# Patient Record
Sex: Male | Born: 1945
Health system: Southern US, Community
[De-identification: ages and names within clinical notes are randomized; demographics above are authoritative.]

## PROBLEM LIST (undated history)

## (undated) DIAGNOSIS — M1990 Unspecified osteoarthritis, unspecified site: Secondary | ICD-10-CM

## (undated) DIAGNOSIS — I679 Cerebrovascular disease, unspecified: Secondary | ICD-10-CM

## (undated) DIAGNOSIS — K429 Umbilical hernia without obstruction or gangrene: Secondary | ICD-10-CM

## (undated) DIAGNOSIS — I214 Non-ST elevation (NSTEMI) myocardial infarction: Secondary | ICD-10-CM

## (undated) DIAGNOSIS — G4733 Obstructive sleep apnea (adult) (pediatric): Secondary | ICD-10-CM

## (undated) DIAGNOSIS — U071 COVID-19: Secondary | ICD-10-CM

## (undated) DIAGNOSIS — F32A Depression, unspecified: Secondary | ICD-10-CM

## (undated) DIAGNOSIS — G463 Brain stem stroke syndrome: Secondary | ICD-10-CM

## (undated) DIAGNOSIS — I499 Cardiac arrhythmia, unspecified: Secondary | ICD-10-CM

## (undated) DIAGNOSIS — H9190 Unspecified hearing loss, unspecified ear: Secondary | ICD-10-CM

## (undated) DIAGNOSIS — F0390 Unspecified dementia without behavioral disturbance: Secondary | ICD-10-CM

## (undated) DIAGNOSIS — E119 Type 2 diabetes mellitus without complications: Secondary | ICD-10-CM

## (undated) DIAGNOSIS — I10 Essential (primary) hypertension: Secondary | ICD-10-CM

## (undated) DIAGNOSIS — I2699 Other pulmonary embolism without acute cor pulmonale: Secondary | ICD-10-CM

## (undated) DIAGNOSIS — I513 Intracardiac thrombosis, not elsewhere classified: Secondary | ICD-10-CM

## (undated) DIAGNOSIS — E785 Hyperlipidemia, unspecified: Secondary | ICD-10-CM

## (undated) DIAGNOSIS — F329 Major depressive disorder, single episode, unspecified: Secondary | ICD-10-CM

## (undated) DIAGNOSIS — G4730 Sleep apnea, unspecified: Secondary | ICD-10-CM

## (undated) DIAGNOSIS — I255 Ischemic cardiomyopathy: Secondary | ICD-10-CM

## (undated) DIAGNOSIS — C801 Malignant (primary) neoplasm, unspecified: Secondary | ICD-10-CM

## (undated) DIAGNOSIS — C4490 Unspecified malignant neoplasm of skin, unspecified: Secondary | ICD-10-CM

## (undated) DIAGNOSIS — I2510 Atherosclerotic heart disease of native coronary artery without angina pectoris: Secondary | ICD-10-CM

## (undated) DIAGNOSIS — K55029 Acute infarction of small intestine, extent unspecified: Secondary | ICD-10-CM

## (undated) HISTORY — DX: Intracardiac thrombosis, not elsewhere classified: I51.3

## (undated) HISTORY — DX: Acute infarction of small intestine, extent unspecified: K55.029

## (undated) HISTORY — DX: Obstructive sleep apnea (adult) (pediatric): G47.33

## (undated) HISTORY — DX: Unspecified malignant neoplasm of skin, unspecified: C44.90

## (undated) HISTORY — DX: Ischemic cardiomyopathy: I25.5

## (undated) HISTORY — PX: COLONOSCOPY W/ POLYPECTOMY: SHX1380

## (undated) HISTORY — DX: Type 2 diabetes mellitus without complications: E11.9

## (undated) HISTORY — DX: Essential (primary) hypertension: I10

## (undated) HISTORY — DX: COVID-19: U07.1

## (undated) HISTORY — PX: COLON SURGERY: SHX602

## (undated) HISTORY — DX: Other pulmonary embolism without acute cor pulmonale: I26.99

---

## 1998-08-29 ENCOUNTER — Ambulatory Visit: Admission: RE | Admit: 1998-08-29 | Discharge: 1998-08-29 | Payer: Self-pay | Admitting: Internal Medicine

## 1998-10-11 ENCOUNTER — Ambulatory Visit: Admission: RE | Admit: 1998-10-11 | Discharge: 1998-10-11 | Payer: Self-pay | Admitting: Internal Medicine

## 2001-04-19 ENCOUNTER — Ambulatory Visit (HOSPITAL_COMMUNITY): Admission: RE | Admit: 2001-04-19 | Discharge: 2001-04-19 | Payer: Self-pay | Admitting: Gastroenterology

## 2005-11-03 ENCOUNTER — Ambulatory Visit: Payer: Self-pay | Admitting: Internal Medicine

## 2005-11-28 ENCOUNTER — Ambulatory Visit (HOSPITAL_BASED_OUTPATIENT_CLINIC_OR_DEPARTMENT_OTHER): Admission: RE | Admit: 2005-11-28 | Discharge: 2005-11-28 | Payer: Self-pay | Admitting: Internal Medicine

## 2005-11-30 ENCOUNTER — Ambulatory Visit: Payer: Self-pay | Admitting: Internal Medicine

## 2005-12-12 ENCOUNTER — Ambulatory Visit: Payer: Self-pay | Admitting: Internal Medicine

## 2007-06-07 ENCOUNTER — Ambulatory Visit: Payer: Self-pay | Admitting: Gastroenterology

## 2007-08-06 ENCOUNTER — Ambulatory Visit: Payer: Self-pay | Admitting: Internal Medicine

## 2007-08-20 ENCOUNTER — Encounter: Payer: Self-pay | Admitting: Internal Medicine

## 2007-08-20 ENCOUNTER — Ambulatory Visit: Payer: Self-pay | Admitting: Internal Medicine

## 2010-11-19 ENCOUNTER — Emergency Department (HOSPITAL_COMMUNITY)
Admission: EM | Admit: 2010-11-19 | Discharge: 2010-11-19 | Disposition: A | Discharge status: Home / Self Care | Payer: Medicare Other | Attending: Emergency Medicine | Admitting: Emergency Medicine

## 2010-11-19 DIAGNOSIS — E119 Type 2 diabetes mellitus without complications: Secondary | ICD-10-CM | POA: Insufficient documentation

## 2010-11-19 DIAGNOSIS — IMO0002 Reserved for concepts with insufficient information to code with codable children: Secondary | ICD-10-CM | POA: Insufficient documentation

## 2010-11-19 DIAGNOSIS — I10 Essential (primary) hypertension: Secondary | ICD-10-CM | POA: Insufficient documentation

## 2010-11-19 LAB — BASIC METABOLIC PANEL
CO2: 25 mEq/L (ref 19–32)
Calcium: 9 mg/dL (ref 8.4–10.5)
GFR calc Af Amer: >60 mL/min (ref >60)
GFR calc non Af Amer: 53 mL/min — ABNORMAL LOW (ref >60)
Sodium: 137 mEq/L (ref 135–145)

## 2010-11-19 LAB — CBC
Hemoglobin: 11.3 g/dL — ABNORMAL LOW (ref 13.0–17.0)
MCHC: 32.9 g/dL (ref 30.0–36.0)
Platelets: 337 10*3/uL (ref 150–400)
RDW: 13.1 % (ref 11.5–15.5)

## 2010-11-19 LAB — DIFFERENTIAL
Basophils Absolute: 0.1 10*3/uL (ref 0.0–0.1)
Basophils Relative: 0 % (ref 0–1)
Monocytes Absolute: 1.2 10*3/uL — ABNORMAL HIGH (ref 0.1–1.0)
Neutro Abs: 8.7 10*3/uL — ABNORMAL HIGH (ref 1.7–7.7)

## 2010-11-22 LAB — WOUND CULTURE: Gram Stain: NONE SEEN

## 2010-12-06 NOTE — Procedures (Signed)
Patrick Miranda, Patrick Miranda NO.:  [id number]   MEDICAL RECORD NO.:  [id number]          PATIENT TYPE:  OUT   LOCATION:  SLEEP CENTER                 FACILITY:  [facility] [place]   PHYSICIAN:  Clinton D. Maple Hudson, M.D. DATE OF BIRTH:  12-28-1945   DATE OF STUDY:                              NOCTURNAL POLYSOMNOGRAM   INDICATION FOR STUDY:  Hypersomnia with sleep apnea.   Epworth sleepiness score 20/24, BMI 25.4.  Weight 180 pounds.   HOME MEDICATIONS:  1.  Glyburide.  2.  Metformin.  3.  Altace.   An original diagnostic study was done 14 years ago recording an AHI of 24  per hour.  A subsequent study was done August 29, 1998 recording an AHI of  22.4 per hour with split protocol, CPAP titration to 9 cwp (AHI 3.8 per  hour).   SLEEP ARCHITECTURE:  Total sleep time 289 minutes with sleep efficiency 78%.  Stage I was 5%, stage II 70%, stages III and IV absent.  REM 26% of total  sleep time.  Sleep latency 32 minutes, REM latency 49 minutes, awake after  sleep onset 46 minutes, arousal index 22.6.  No bedtime medication was  reported.   RESPIRATORY DATA:  Split study protocol.  Apnea/hypopnea index (AHI, RDI)  31.3 obstructive events per hour indicating moderate obstructive sleep  apnea/hypopnea syndrome before CPAP.  This included 7 obstructive apneas, 2  central apneas and 57 hypopneas before CPAP.  All events were noted and most  sleep before CPAP was recorded while on his right side.  REM AHI 2.4 per  hour.  CPAP was titrated to 9 cwp, AHI zero per hour.  A medium ComfortGel  mask was used with a heated humidifier.   OXYGEN DATA:  Moderate snoring with oxygen desaturation to a nadir of 89%.  Mean oxygen saturation after CPAP control was 95-96% on room air.   CARDIAC DATA:  Sinus rhythm with heart rate 57-72 beats per minute,  occasional PVCs.   MOVEMENT/PARASOMNIA:  A total of 311 limb jerks were recorded, of which 18  were associated with arousal or awakening for a  periodic limb movement with  arousal index of 3.7 per hour, which is mildly increased.  Bathroom x1.   IMPRESSION/RECOMMENDATION:  1.  Moderate obstructive sleep apnea/hypopnea syndrome, AHI 31.3 per hour      with all events recorded while sleeping on the right side.  Moderate      snoring with oxygen desaturation to a nadir of 89%.  2.  Split protocol study August 29, 1998 had recorded at AHI of 22.4 per      hour and titrated to CPAP of 9 cwp.  Weight at that study was recorded      as 188 pounds.  3.  Successful CPAP titration to 9 cwp, AHI zero per hour.  A medium      ComfortGel mask was used with heated      humidifier.  4.  Periodic limb movement with arousal, 3.7 per hour.      Clinton D. Maple Hudson, M.D.  Diplomate, Biomedical engineer of Sleep Medicine  Electronically Signed     CDY/MEDQ  D:  11/30/2005 12:40:29  T:  12/01/2005 11:14:58  Job:  [id number]

## 2011-08-22 DIAGNOSIS — G463 Brain stem stroke syndrome: Secondary | ICD-10-CM

## 2011-08-22 HISTORY — DX: Brain stem stroke syndrome: G46.3

## 2011-09-05 ENCOUNTER — Emergency Department (HOSPITAL_COMMUNITY): Payer: Medicare Other

## 2011-09-05 ENCOUNTER — Encounter (HOSPITAL_COMMUNITY): Payer: Self-pay

## 2011-09-05 ENCOUNTER — Other Ambulatory Visit: Payer: Self-pay

## 2011-09-05 ENCOUNTER — Inpatient Hospital Stay (HOSPITAL_COMMUNITY)
Admission: EM | Admit: 2011-09-05 | Discharge: 2011-09-07 | DRG: 066 | Disposition: A | Discharge status: Home / Self Care | Payer: Medicare Other | Attending: Internal Medicine | Admitting: Internal Medicine

## 2011-09-05 DIAGNOSIS — I639 Cerebral infarction, unspecified: Secondary | ICD-10-CM | POA: Diagnosis present

## 2011-09-05 DIAGNOSIS — E119 Type 2 diabetes mellitus without complications: Secondary | ICD-10-CM

## 2011-09-05 DIAGNOSIS — I10 Essential (primary) hypertension: Secondary | ICD-10-CM | POA: Diagnosis present

## 2011-09-05 DIAGNOSIS — E785 Hyperlipidemia, unspecified: Secondary | ICD-10-CM

## 2011-09-05 DIAGNOSIS — G4733 Obstructive sleep apnea (adult) (pediatric): Secondary | ICD-10-CM

## 2011-09-05 DIAGNOSIS — J45909 Unspecified asthma, uncomplicated: Secondary | ICD-10-CM | POA: Diagnosis present

## 2011-09-05 DIAGNOSIS — Z7982 Long term (current) use of aspirin: Secondary | ICD-10-CM

## 2011-09-05 DIAGNOSIS — I6350 Cerebral infarction due to unspecified occlusion or stenosis of unspecified cerebral artery: Principal | ICD-10-CM | POA: Diagnosis present

## 2011-09-05 DIAGNOSIS — Z79899 Other long term (current) drug therapy: Secondary | ICD-10-CM

## 2011-09-05 DIAGNOSIS — R471 Dysarthria and anarthria: Secondary | ICD-10-CM | POA: Diagnosis present

## 2011-09-05 DIAGNOSIS — I129 Hypertensive chronic kidney disease with stage 1 through stage 4 chronic kidney disease, or unspecified chronic kidney disease: Secondary | ICD-10-CM | POA: Diagnosis present

## 2011-09-05 DIAGNOSIS — Z23 Encounter for immunization: Secondary | ICD-10-CM

## 2011-09-05 DIAGNOSIS — M129 Arthropathy, unspecified: Secondary | ICD-10-CM | POA: Diagnosis present

## 2011-09-05 DIAGNOSIS — D649 Anemia, unspecified: Secondary | ICD-10-CM | POA: Diagnosis present

## 2011-09-05 DIAGNOSIS — N182 Chronic kidney disease, stage 2 (mild): Secondary | ICD-10-CM

## 2011-09-05 DIAGNOSIS — R279 Unspecified lack of coordination: Secondary | ICD-10-CM | POA: Diagnosis present

## 2011-09-05 HISTORY — DX: Sleep apnea, unspecified: G47.30

## 2011-09-05 HISTORY — DX: Hyperlipidemia, unspecified: E78.5

## 2011-09-05 HISTORY — DX: Unspecified osteoarthritis, unspecified site: M19.90

## 2011-09-05 HISTORY — DX: Essential (primary) hypertension: I10

## 2011-09-05 LAB — CBC
HCT: 33.9 % — ABNORMAL LOW (ref 39.0–52.0)
Hemoglobin: 11.4 g/dL — ABNORMAL LOW (ref 13.0–17.0)
MCH: 30.7 pg (ref 26.0–34.0)
MCHC: 33.6 g/dL (ref 30.0–36.0)
MCV: 91.4 fL (ref 78.0–100.0)

## 2011-09-05 LAB — BASIC METABOLIC PANEL
BUN: 29 mg/dL — ABNORMAL HIGH (ref 6–23)
Chloride: 109 mEq/L (ref 96–112)
Creatinine, Ser: 1.32 mg/dL (ref 0.50–1.35)
GFR calc Af Amer: 63 mL/min — ABNORMAL LOW (ref >90)
GFR calc non Af Amer: 55 mL/min — ABNORMAL LOW (ref >90)
Glucose, Bld: 110 mg/dL — ABNORMAL HIGH (ref 70–99)

## 2011-09-05 LAB — DIFFERENTIAL
Basophils Relative: 1 % (ref 0–1)
Eosinophils Absolute: 0.7 10*3/uL (ref 0.0–0.7)
Lymphs Abs: 3.4 10*3/uL (ref 0.7–4.0)
Monocytes Absolute: 0.9 10*3/uL (ref 0.1–1.0)
Monocytes Relative: 9 % (ref 3–12)

## 2011-09-05 LAB — POCT I-STAT TROPONIN I

## 2011-09-05 MED ORDER — SODIUM CHLORIDE 0.9 % IV SOLN
INTRAVENOUS | Status: DC
  Administered 2011-09-05: 21:00:00 via INTRAVENOUS

## 2011-09-05 NOTE — ED Provider Notes (Signed)
History     CSN: [id number]  Arrival date & time 09/05/11  4098   First MD Initiated Contact with Patient 09/05/11 1910      Chief Complaint  Patient presents with  . Aphasia  . Shoulder Pain    (Consider location/radiation/quality/duration/timing/severity/associated sxs/prior treatment) Patient is a 66 y.o. male presenting with shoulder pain. The history is provided by the patient.  Shoulder Pain Pertinent negatives include no chest pain, no abdominal pain, no headaches and no shortness of breath.   patient states she's had some pains in his right mid upper arm for the last few days. It comes and goes. No trauma. No weakness. He states she's also had some difficulty speaking. He states it is just trouble getting the words out. They are slurred. No headache. He states he's been having a little unsteadiness while walking to. No chest pain. No numbness or weakness. No vision changes. Patient states he's had some difficulty swallowing also.   Past Medical History  Diagnosis Date  . Diabetes mellitus   . Hypertension   . Arthritis   . Hyperlipidemia     History reviewed. No pertinent past surgical history.  History reviewed. No pertinent family history.  History  Substance Use Topics  . Smoking status: Never Smoker   . Smokeless tobacco: Not on file  . Alcohol Use: No      Review of Systems  Constitutional: Negative for activity change and appetite change.  HENT: Negative for neck stiffness.   Eyes: Negative for pain.  Respiratory: Negative for chest tightness and shortness of breath.   Cardiovascular: Negative for chest pain and leg swelling.  Gastrointestinal: Negative for nausea, vomiting, abdominal pain and diarrhea.  Genitourinary: Negative for flank pain.  Musculoskeletal: Negative for back pain.  Skin: Negative for rash.  Neurological: Positive for speech difficulty. Negative for weakness, numbness and headaches.  Psychiatric/Behavioral: Negative for behavioral  problems.    Allergies  Review of patient's allergies indicates no known allergies.  Home Medications   Current Outpatient Rx  Name Route Sig Dispense Refill  . ASPIRIN EC 81 MG PO TBEC Oral Take 81 mg by mouth daily.    Marland Kitchen CARVEDILOL 12.5 MG PO TABS Oral Take 12.5 mg by mouth 2 (two) times daily with a meal.    . DICLOFENAC SODIUM ER 100 MG PO TB24 Oral Take 100 mg by mouth daily.    . GLYBURIDE-METFORMIN 5-500 MG PO TABS Oral Take 1-2 tablets by mouth 3 (three) times daily. Take 1 tablet at breakfast and lunch, and 2 tablets at dinner.    Marland Kitchen LOSARTAN POTASSIUM-HCTZ 100-25 MG PO TABS Oral Take 1 tablet by mouth daily.    Marland Kitchen PRAVASTATIN SODIUM 40 MG PO TABS Oral Take 40 mg by mouth daily.      BP 154/83  Pulse 73  Temp(Src) 98.3 F (36.8 C) (Oral)  Resp 18  SpO2 98%  Physical Exam  Nursing note and vitals reviewed. Constitutional: He is oriented to person, place, and time. He appears well-developed and well-nourished.  HENT:  Head: Normocephalic and atraumatic.  Eyes: EOM are normal. Pupils are equal, round, and reactive to light.  Neck: Normal range of motion. Neck supple.  Cardiovascular: Normal rate, regular rhythm and normal heart sounds.   No murmur heard. Pulmonary/Chest: Effort normal and breath sounds normal.  Abdominal: Soft. Bowel sounds are normal. He exhibits no distension and no mass. There is no tenderness. There is no rebound and no guarding.  Musculoskeletal: Normal range of  motion. He exhibits no edema.       No tenderness to right upper extremity  Neurological: He is alert and oriented to person, place, and time. A cranial nerve deficit is present.       Somewhat slurred speech. Face is symmetric. Cranial nerves intact besides slurred speech.  Skin: Skin is warm and dry.  Psychiatric: He has a normal mood and affect.    ED Course  Procedures (including critical care time)  Labs Reviewed  CBC - Abnormal; Notable for the following:    WBC 10.7 (*)    RBC  3.71 (*)    Hemoglobin 11.4 (*)    HCT 33.9 (*)    All other components within normal limits  DIFFERENTIAL - Abnormal; Notable for the following:    Eosinophils Relative 7 (*)    All other components within normal limits  BASIC METABOLIC PANEL - Abnormal; Notable for the following:    Glucose, Bld 110 (*)    BUN 29 (*)    GFR calc non Af Amer 55 (*)    GFR calc Af Amer 63 (*)    All other components within normal limits  GLUCOSE, CAPILLARY  POCT I-STAT TROPONIN I  URINALYSIS, ROUTINE W REFLEX MICROSCOPIC   Dg Chest 2 View  09/05/2011  *RADIOLOGY REPORT*  Clinical Data: Slurred speech.  Right arm pain.  CHEST - 2 VIEW  Comparison: None.  Findings: Two views of the chest were obtained. Heart and mediastinum are within normal limits.  The trachea is midline.  The lungs are clear.  Mild degenerative changes in the thoracic spine.  IMPRESSION: No acute chest findings.  Original Report Authenticated By: Richarda Overlie, M.D.   Ct Head Wo Contrast  09/05/2011  *RADIOLOGY REPORT*  Clinical Data: Difficulty walking.  Dysphagia.Diabetes and hypertension.  CT HEAD WITHOUT CONTRAST  Technique:  Contiguous axial images were obtained from the base of the skull through the vertex without contrast.  Comparison: None.  Findings: Bone windows demonstrate minimal mucosal thickening of ethmoid air cells.  Cerumen within the right external ear canal.  Soft tissue windows demonstrate advanced vertebral and carotid atherosclerosis.  Mild but age advanced low density in the periventricular white matter likely related to small vessel disease. No  mass lesion, hemorrhage, hydrocephalus, acute infarct, intra-axial, or extra-axial fluid collection.  IMPRESSION:  1. No acute intracranial abnormality. 2.  Small vessel ischemic change.  Original Report Authenticated By: Consuello Bossier, M.D.     1. Dysarthria     Date: 09/05/2011  Rate: 71  Rhythm: normal sinus rhythm  QRS Axis: normal  Intervals: normal  ST/T Wave  abnormalities: normal  Conduction Disutrbances:none  Narrative Interpretation:   Old EKG Reviewed: none available     MDM  Presents to the ER with difficulty speaking for last 2 or 3 days. Speech is a little bit slurred on exam. He reportedly has also had some trouble swallowing. Neuro exam was good except for slurred speech. Head CT is normal. He also his right midarm pain. He is nontender. His EKG is reassuring. He'll be admitted for further evaluation        Juliet Rude. Rubin Payor, MD 09/05/11 2152

## 2011-09-05 NOTE — H&P (Addendum)
Patrick Miranda is an 66 y.o. male.   Chief Complaint: cannot speak properly HPI:  The patient is a 66 year old man who is right-handed, and for the past few days has had initially intermittent and now persistent difficulty with pronouncing words and producing speech. He has not had any problems with headaches, vision change, weakness, gait unsteadiness, palpitations, or chest discomfort. He has no history of cerebrovascular disease. He does have a history of diabetes mellitus, type II, hypertension, and hyperlipidemia. He also has a history of obstructive sleep apnea for which he uses CPAP at 8 cm of pressure. He has family were concerned by changes in his speech so they presented to the emergency room where the workup included labs and a CT scan of the head that were unremarkable as well as an EKG that showed normal sinus rhythm. Currently, he feels fine other than having frustration with speech.  Past Medical History  Diagnosis Date  . Diabetes mellitus   . Hypertension   . Arthritis   . Hyperlipidemia     Medications Prior to Admission  Medication Dose Route Frequency Provider Last Rate Last Dose  . 0.9 %  sodium chloride infusion   Intravenous Continuous Juliet Rude. Rubin Payor, MD 125 mL/hr at 09/05/11 2108     No current outpatient prescriptions on file as of 09/05/2011.    ADDITIONAL HOME MEDICATIONS: see pharmacy reconciliation  PHYSICIANS INVOLVED IN CARE: Geoffry Paradise (PCP), Yancey Flemings, Dr. Maple Hudson  History reviewed. No pertinent past surgical history. he had a colonoscopy done about 4 years ago  History reviewed. No pertinent family history. his family history is most notable for premature cardiovascular disease and strokes in close relatives.   Social History:  reports that he has never smoked. He does not have any smokeless tobacco history on file. He reports that he does not drink alcohol or use illicit drugs. he is married and has one daughter and one son. He has no  history of tobacco or alcohol abuse. 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  Allergies: No Known Allergies   ROS: anemia, asthma, diabetes and high blood pressure, he also has obstructive sleep apnea for which he is on CPAP  PHYSICAL EXAM: Blood pressure 122/54, pulse 66, temperature 98.3 F (36.8 C), temperature source Oral, resp. rate 16, SpO2 98.00%. The patient is an elderly white man who was in no apparent distress lying supine. HEENT exam was significant for the lack of a facial droop. Neck was supple without jugular venous distention or carotid bruit, chest was clear to auscultation, heart had a regular rate and rhythm without significant murmur or gallop, abdomen had normal bowel sounds no tenderness, extremities were without cyanosis, clubbing, or edema. Neurologic exam: He was alert and well oriented with normal affect. He did have mild difficulty with dysarthria and word finding. Cranial nerves II through XII are normal, sensory exam was grossly normal, motor strength was 5 out of 5 throughout, cerebellar function and gait were not assessed in the emergency room. In  Results for orders placed during the hospital encounter of 09/05/11 (from the past 48 hour(s))  GLUCOSE, CAPILLARY     Status: Normal   Collection Time   09/05/11  6:51 PM      Component Value Range Comment   Glucose-Capillary 90  70 - 99 (mg/dL)   CBC     Status: Abnormal   Collection Time   09/05/11  8:46 PM      Component  Value Range Comment   WBC 10.7 (*) 4.0 - 10.5 (K/uL)    RBC 3.71 (*) 4.22 - 5.81 (MIL/uL)    Hemoglobin 11.4 (*) 13.0 - 17.0 (g/dL)    HCT 40.9 (*) 81.1 - 52.0 (%)    MCV 91.4  78.0 - 100.0 (fL)    MCH 30.7  26.0 - 34.0 (pg)    MCHC 33.6  30.0 - 36.0 (g/dL)    RDW 91.4  78.2 - 95.6 (%)    Platelets 315  150 - 400 (K/uL)   DIFFERENTIAL     Status: Abnormal   Collection Time   09/05/11  8:46 PM      Component Value Range Comment   Neutrophils Relative 52  43 - 77 (%)     Neutro Abs 5.5  1.7 - 7.7 (K/uL)    Lymphocytes Relative 32  12 - 46 (%)    Lymphs Abs 3.4  0.7 - 4.0 (K/uL)    Monocytes Relative 9  3 - 12 (%)    Monocytes Absolute 0.9  0.1 - 1.0 (K/uL)    Eosinophils Relative 7 (*) 0 - 5 (%)    Eosinophils Absolute 0.7  0.0 - 0.7 (K/uL)    Basophils Relative 1  0 - 1 (%)    Basophils Absolute 0.1  0.0 - 0.1 (K/uL)   BASIC METABOLIC PANEL     Status: Abnormal   Collection Time   09/05/11  8:46 PM      Component Value Range Comment   Sodium 141  135 - 145 (mEq/L)    Potassium 3.6  3.5 - 5.1 (mEq/L)    Chloride 109  96 - 112 (mEq/L)    CO2 22  19 - 32 (mEq/L)    Glucose, Bld 110 (*) 70 - 99 (mg/dL)    BUN 29 (*) 6 - 23 (mg/dL)    Creatinine, Ser 2.13  0.50 - 1.35 (mg/dL)    Calcium 9.3  8.4 - 10.5 (mg/dL)    GFR calc non Af Amer 55 (*) >90 (mL/min)    GFR calc Af Amer 63 (*) >90 (mL/min)   POCT I-STAT TROPONIN I     Status: Normal   Collection Time   09/05/11  8:59 PM      Component Value Range Comment   Troponin i, poc 0.00  0.00 - 0.08 (ng/mL)    Comment 3             Dg Chest 2 View  09/05/2011  *RADIOLOGY REPORT*  Clinical Data: Slurred speech.  Right arm pain.  CHEST - 2 VIEW  Comparison: None.  Findings: Two views of the chest were obtained. Heart and mediastinum are within normal limits.  The trachea is midline.  The lungs are clear.  Mild degenerative changes in the thoracic spine.  IMPRESSION: No acute chest findings.  Original Report Authenticated By: Richarda Overlie, M.D.   Ct Head Wo Contrast  09/05/2011  *RADIOLOGY REPORT*  Clinical Data: Difficulty walking.  Dysphagia.Diabetes and hypertension.  CT HEAD WITHOUT CONTRAST  Technique:  Contiguous axial images were obtained from the base of the skull through the vertex without contrast.  Comparison: None.  Findings: Bone windows demonstrate minimal mucosal thickening of ethmoid air cells.  Cerumen within the right external ear canal.  Soft tissue windows demonstrate advanced vertebral and  carotid atherosclerosis.  Mild but age advanced low density in the periventricular white matter likely related to small vessel disease. No  mass lesion, hemorrhage, hydrocephalus, acute  infarct, intra-axial, or extra-axial fluid collection.  IMPRESSION:  1. No acute intracranial abnormality. 2.  Small vessel ischemic change.  Original Report Authenticated By: Consuello Bossier, M.D.     Assessment/Plan #1 Dysarthria:  Most likely from new left brain limited stroke in Broca's area, and less likely from a TIA, meds, or infection. We will admit and check expeditious MRI brain, carotid US, echo, telemetry. We will also arrange for expeditious speech path, PT, and OT evaluations. We will also start him on aspirin given the high likelihood that he has had a stroke. #2 DM2:  Stable on current meds. #3 Hypertension:  Stable on current meds.  #4 Anemia: Mild and without evidence of acute bleeding.  #5 Obstructive Sleep Apnea: Clinically stable and we will continue CPAP treatment as an inpatient.   Tremain Rucinski G 09/05/2011, 11:44 PM

## 2011-09-05 NOTE — ED Notes (Signed)
NURSE FIRST: patient ambulatory into [facility] with spouse. c/o slurred speech x 2 days. At the time of arrival, pt aaox3, resp e/u, nad. Speech clear, BUE grips strong and equal, no facial droop. Patient has no complaints of cp, sob, dizziness, unilateral weakness or any n/t. Pt transported to triage and report given to triage RN.

## 2011-09-05 NOTE — ED Notes (Signed)
Pt started to have weakness and difficulty walking along with slurred speech that has been getting worse over the past 2 days. Wife at the bedside states that pt has also been coughing and choking while attempting to eat. Pt does have some slurred speech in triage. Last seen normal 2 days ago.

## 2011-09-06 ENCOUNTER — Encounter (HOSPITAL_COMMUNITY): Payer: Self-pay | Admitting: General Practice

## 2011-09-06 ENCOUNTER — Inpatient Hospital Stay (HOSPITAL_COMMUNITY): Payer: Medicare Other

## 2011-09-06 LAB — CREATININE, SERUM
Creatinine, Ser: 1.27 mg/dL (ref 0.50–1.35)
GFR calc Af Amer: 66 mL/min — ABNORMAL LOW (ref >90)
GFR calc non Af Amer: 57 mL/min — ABNORMAL LOW (ref >90)

## 2011-09-06 LAB — COMPREHENSIVE METABOLIC PANEL
ALT: 8 U/L (ref 0–53)
AST: 17 U/L (ref 0–37)
Albumin: 2.9 g/dL — ABNORMAL LOW (ref 3.5–5.2)
Alkaline Phosphatase: 66 U/L (ref 39–117)
Chloride: 111 mEq/L (ref 96–112)
Potassium: 3.8 mEq/L (ref 3.5–5.1)
Sodium: 141 mEq/L (ref 135–145)
Total Bilirubin: 0.2 mg/dL — ABNORMAL LOW (ref 0.3–1.2)
Total Protein: 5.4 g/dL — ABNORMAL LOW (ref 6.0–8.3)

## 2011-09-06 LAB — CBC
HCT: 31.9 % — ABNORMAL LOW (ref 39.0–52.0)
Hemoglobin: 10.6 g/dL — ABNORMAL LOW (ref 13.0–17.0)
MCHC: 33.2 g/dL (ref 30.0–36.0)
RBC: 3.48 MIL/uL — ABNORMAL LOW (ref 4.22–5.81)

## 2011-09-06 LAB — GLUCOSE, CAPILLARY
Glucose-Capillary: 122 mg/dL — ABNORMAL HIGH (ref 70–99)
Glucose-Capillary: 142 mg/dL — ABNORMAL HIGH (ref 70–99)

## 2011-09-06 MED ORDER — ENOXAPARIN SODIUM 40 MG/0.4ML ~~LOC~~ SOLN
40.0000 mg | SUBCUTANEOUS | Status: DC
  Administered 2011-09-06: 40 mg via SUBCUTANEOUS
  Filled 2011-09-06 (×2): qty 0.4

## 2011-09-06 MED ORDER — ASPIRIN EC 325 MG PO TBEC
325.0000 mg | DELAYED_RELEASE_TABLET | Freq: Every day | ORAL | Status: DC
  Administered 2011-09-06: 325 mg via ORAL
  Filled 2011-09-06: qty 1

## 2011-09-06 MED ORDER — POTASSIUM CHLORIDE IN NACL 20-0.9 MEQ/L-% IV SOLN
INTRAVENOUS | Status: DC
  Filled 2011-09-06 (×2): qty 1000

## 2011-09-06 MED ORDER — CARVEDILOL 12.5 MG PO TABS
12.5000 mg | ORAL_TABLET | Freq: Two times a day (BID) | ORAL | Status: DC
  Administered 2011-09-06 – 2011-09-07 (×3): 12.5 mg via ORAL
  Filled 2011-09-06 (×5): qty 1

## 2011-09-06 MED ORDER — SODIUM CHLORIDE 0.9 % IJ SOLN
3.0000 mL | Freq: Two times a day (BID) | INTRAMUSCULAR | Status: DC
  Administered 2011-09-06 – 2011-09-07 (×2): 3 mL via INTRAVENOUS

## 2011-09-06 MED ORDER — ACETAMINOPHEN 325 MG PO TABS: 650.0000 mg | ORAL_TABLET | Freq: Four times a day (QID) | ORAL | Status: DC | PRN

## 2011-09-06 MED ORDER — SIMVASTATIN 10 MG PO TABS
10.0000 mg | ORAL_TABLET | Freq: Every day | ORAL | Status: DC
  Filled 2011-09-06 (×2): qty 1

## 2011-09-06 MED ORDER — HYDROCHLOROTHIAZIDE 25 MG PO TABS
25.0000 mg | ORAL_TABLET | Freq: Every day | ORAL | Status: DC
  Administered 2011-09-06 – 2011-09-07 (×2): 25 mg via ORAL
  Filled 2011-09-06 (×2): qty 1

## 2011-09-06 MED ORDER — ZOLPIDEM TARTRATE 5 MG PO TABS: 5.0000 mg | ORAL_TABLET | Freq: Every evening | ORAL | Status: DC | PRN

## 2011-09-06 MED ORDER — DICLOFENAC SODIUM 50 MG PO TBEC
50.0000 mg | DELAYED_RELEASE_TABLET | Freq: Two times a day (BID) | ORAL | Status: DC
  Administered 2011-09-06 – 2011-09-07 (×3): 50 mg via ORAL
  Filled 2011-09-06 (×4): qty 1

## 2011-09-06 MED ORDER — GLYBURIDE 5 MG PO TABS
10.0000 mg | ORAL_TABLET | Freq: Every day | ORAL | Status: DC
  Administered 2011-09-06: 10 mg via ORAL
  Filled 2011-09-06 (×2): qty 2

## 2011-09-06 MED ORDER — HYDROCODONE-ACETAMINOPHEN 5-325 MG PO TABS: 1.0000 | ORAL_TABLET | ORAL | Status: DC | PRN

## 2011-09-06 MED ORDER — ACETAMINOPHEN 650 MG RE SUPP: 650.0000 mg | Freq: Four times a day (QID) | RECTAL | Status: DC | PRN

## 2011-09-06 MED ORDER — GLYBURIDE-METFORMIN 5-500 MG PO TABS: 1.0000 | ORAL_TABLET | Freq: Three times a day (TID) | ORAL | Status: DC

## 2011-09-06 MED ORDER — METFORMIN HCL 500 MG PO TABS
500.0000 mg | ORAL_TABLET | ORAL | Status: DC
  Administered 2011-09-06 – 2011-09-07 (×3): 500 mg via ORAL
  Filled 2011-09-06 (×5): qty 1

## 2011-09-06 MED ORDER — ASPIRIN EC 81 MG PO TBEC
81.0000 mg | DELAYED_RELEASE_TABLET | Freq: Every day | ORAL | Status: DC
  Filled 2011-09-06: qty 1

## 2011-09-06 MED ORDER — METFORMIN HCL 500 MG PO TABS
1000.0000 mg | ORAL_TABLET | Freq: Every day | ORAL | Status: DC
  Administered 2011-09-06: 1000 mg via ORAL
  Filled 2011-09-06 (×2): qty 2

## 2011-09-06 MED ORDER — DICLOFENAC SODIUM ER 100 MG PO TB24: 100.0000 mg | ORAL_TABLET | Freq: Every day | ORAL | Status: DC

## 2011-09-06 MED ORDER — GLYBURIDE 5 MG PO TABS
5.0000 mg | ORAL_TABLET | ORAL | Status: DC
  Administered 2011-09-06 – 2011-09-07 (×3): 5 mg via ORAL
  Filled 2011-09-06 (×5): qty 1

## 2011-09-06 MED ORDER — LOSARTAN POTASSIUM-HCTZ 100-25 MG PO TABS: 1.0000 | ORAL_TABLET | Freq: Every day | ORAL | Status: DC

## 2011-09-06 MED ORDER — LOSARTAN POTASSIUM 50 MG PO TABS
100.0000 mg | ORAL_TABLET | Freq: Every day | ORAL | Status: DC
  Administered 2011-09-06 – 2011-09-07 (×2): 100 mg via ORAL
  Filled 2011-09-06 (×2): qty 2

## 2011-09-06 NOTE — Progress Notes (Signed)
CSW received consult for SNF. PT eval with no f/u PT recommendations noted. No other CSW needs reported or noted. CSW signing off. Please re-consult if SNF needed. Dellie Burns, MSW, [place] [phone] (weekend)

## 2011-09-06 NOTE — Progress Notes (Signed)
Speech Language/Pathology Speech Language Pathology Evaluation Patient Details Name: Patrick Miranda MRN: [id number] DOB: 04/22/1946 Today's Date: 09/06/2011  Problem List:  Patient Active Problem List  Diagnoses  . Dysarthria  . Chronic kidney disease (CKD), stage II (mild)  . Hypertension  . DM (diabetes mellitus), type 2  . Hyperlipidemia  . Obstructive sleep apnea  . Anemia   Past Medical History:  Past Medical History  Diagnosis Date  . Diabetes mellitus   . Hypertension   . Arthritis   . Hyperlipidemia   . Sleep apnea     on cpap at home   Past Surgical History: History reviewed. No pertinent past surgical history.  SLP Assessment/Plan/Recommendation  Clinical Impression: Pt is 66 y.o. male who was admitted after experiencing speech changes the last two days.  CT head with no acute intracranial abnormality; MRI pending.  Cranial nerve exam WNL.  Comprehension/expression WFL excluding decreased spontaneity of output per wife.  Pt with mild, likely [facility] dysarthria, with slightly distorted production of consonants during connected speech, however speech is intelligible. Pt informally observed eating/drinking with no apparent signs of dysphagia.  He passed the RN stroke swallow screen, hence clinical swallow assessment is not warranted.  No further SLP needs identified; will sign-off.  SLP Recommendation/Assessment: Patient does not need any further Speech Language Pathology Services No Skilled Speech Therapy: All education completed Recommendations Follow up Recommendations: None Individuals Consulted Consulted and Agree with Results and Recommendations: Patient;Family member/caregiver Family Member Consulted : spouse  SLP Goals n/a   Leeandre Nordling L. Samson Frederic, [place] CCC/SLP Pager [phone]    Blenda Mounts Laurice 09/06/2011, 9:29 AM

## 2011-09-06 NOTE — Progress Notes (Signed)
Subjective: He slept well overnight, not having any difficulty swallowing, speech is slow. Wife indicates that he has had  more subacute problems with generalized slowed speech, slowed thinking, mild dyspraxia.  Objective: Vital signs in last 24 hours: Temp:  [97.9 F (36.6 C)-98.3 F (36.8 C)] 98.1 F (36.7 C) (02/16 1000) Pulse Rate:  [55-73] 64  (02/16 1000) Resp:  [14-20] 20  (02/16 1000) BP: (109-159)/(49-84) 139/68 mmHg (02/16 1000) SpO2:  [97 %-100 %] 98 % (02/16 1000) Weight:  [81.9 kg (180 lb 8.9 oz)] 81.9 kg (180 lb 8.9 oz) (02/16 0115) Weight change:    Intake/Output from previous day: 02/15 0701 - 02/16 0700 In: 1258.7 [I.V.:1258.7] Out: 350 [Urine:350]   General appearance: alert, cooperative and no distress Head: Normocephalic, without obvious abnormality, atraumatic Resp: clear to auscultation bilaterally Cardio: regular rate and rhythm, S1, S2 normal, no murmur, click, rub or gallop Extremities: extremities normal, atraumatic, no cyanosis or edema Neurologic: Mental status: Alert, oriented, thought content appropriate Cranial nerves: normal Motor: grossly normal Coordination: finger to nose normal on the right  Lab Results:  Basename 09/06/11 0209 09/05/11 2046  WBC 8.9 10.7*  HGB 10.6* 11.4*  HCT 31.9* 33.9*  PLT 294 315   BMET  Basename 09/06/11 0600 09/06/11 0209 09/05/11 2046  NA 141 -- 141  K 3.8 -- 3.6  CL 111 -- 109  CO2 23 -- 22  GLUCOSE 95 -- 110*  BUN 24* -- 29*  CREATININE 1.21 1.27 --  CALCIUM 8.9 -- 9.3   CMET CMP     Component Value Date/Time   NA 141 09/06/2011 0600   K 3.8 09/06/2011 0600   CL 111 09/06/2011 0600   CO2 23 09/06/2011 0600   GLUCOSE 95 09/06/2011 0600   BUN 24* 09/06/2011 0600   CREATININE 1.21 09/06/2011 0600   CALCIUM 8.9 09/06/2011 0600   PROT 5.4* 09/06/2011 0600   ALBUMIN 2.9* 09/06/2011 0600   AST 17 09/06/2011 0600   ALT 8 09/06/2011 0600   ALKPHOS 66 09/06/2011 0600   BILITOT 0.2* 09/06/2011 0600   GFRNONAA 61* 09/06/2011 0600   GFRAA 70* 09/06/2011 0600    CBG (last 3)   Basename 09/06/11 0804 09/05/11 1851  GLUCAP 121* 90    INR RESULTS:   No results found for this basename: INR, PROTIME     Studies/Results: Dg Chest 2 View  09/05/2011  *RADIOLOGY REPORT*  Clinical Data: Slurred speech.  Right arm pain.  CHEST - 2 VIEW  Comparison: None.  Findings: Two views of the chest were obtained. Heart and mediastinum are within normal limits.  The trachea is midline.  The lungs are clear.  Mild degenerative changes in the thoracic spine.  IMPRESSION: No acute chest findings.  Original Report Authenticated By: Richarda Overlie, M.D.   Ct Head Wo Contrast  09/05/2011  *RADIOLOGY REPORT*  Clinical Data: Difficulty walking.  Dysphagia.Diabetes and hypertension.  CT HEAD WITHOUT CONTRAST  Technique:  Contiguous axial images were obtained from the base of the skull through the vertex without contrast.  Comparison: None.  Findings: Bone windows demonstrate minimal mucosal thickening of ethmoid air cells.  Cerumen within the right external ear canal.  Soft tissue windows demonstrate advanced vertebral and carotid atherosclerosis.  Mild but age advanced low density in the periventricular white matter likely related to small vessel disease. No  mass lesion, hemorrhage, hydrocephalus, acute infarct, intra-axial, or extra-axial fluid collection.  IMPRESSION:  1. No acute intracranial abnormality. 2.  Small vessel ischemic change.  Original Report Authenticated By: Consuello Bossier, M.D.   Mr Brain Wo Contrast  09/06/2011  *RADIOLOGY REPORT*  Clinical Data: Dysphasia.  Dysarthria.  Hypertension.  Diabetic.  MRI HEAD WITHOUT CONTRAST  Technique:  Multiplanar, multiecho pulse sequences of the brain and surrounding structures were obtained according to standard protocol without intravenous contrast.  Comparison: 09/05/2011 CT.  No comparison MR.  Findings: Small to moderate-size right paracentral pontine acute infarct.   No intracranial hemorrhage.  Moderate small vessel disease type changes.  Global atrophy without hydrocephalus.  No intracranial mass lesion detected on this unenhanced exam.  Major intracranial vascular structures are patent.  Markedly ectatic intracranial vasculature most notable involving the right vertebral artery and basilar artery with indentation upon the pons and hypothalamic region.  Atherosclerotic type changes of the left vertebral artery.  Mild paranasal sinus mucosal thickening.  IMPRESSION: Small to moderate-size right paracentral pontine acute infarct.  Moderate small vessel disease type changes.  Global atrophy without hydrocephalus.  Original Report Authenticated By: Fuller Canada, M.D.    Medications: I have reviewed the patient's current medications.  Assessment/Plan: #1 Acute Stroke:  Report just in from the MRI shows an acute right pons stroke. Will complete workup including carotid US and echo, and see what OT and PT evaluations indicate, may need SNF rehab if not able to ambulate safely. He did not have problems with swallowing with SLP evaluation.  #2 DM2:  Stable on current meds.  #3 HTN:  Stable.   LOS: 1 day   Amarachukwu Lakatos G 09/06/2011, 12:20 PM

## 2011-09-06 NOTE — Progress Notes (Signed)
Called [place] in respiratory about pt getting a cpap machine around 2:06. She stated that she would look for one and if there is one available she would bring it up. I will continue to monitor the pt and check his O2 sats. Sanda Linger

## 2011-09-06 NOTE — Evaluation (Signed)
Physical Therapy Evaluation Patient Details Name: Patrick Miranda MRN: [id number] DOB: Sep 20, 1945 Today's Date: 09/06/2011  Problem List:  Patient Active Problem List  Diagnoses  . Dysarthria  . Chronic kidney disease (CKD), stage II (mild)  . Hypertension  . DM (diabetes mellitus), type 2  . Hyperlipidemia  . Obstructive sleep apnea  . Anemia    Past Medical History:  Past Medical History  Diagnosis Date  . Diabetes mellitus   . Hypertension   . Arthritis   . Hyperlipidemia   . Sleep apnea     on cpap at home   Past Surgical History: History reviewed. No pertinent past surgical history.  PT Assessment/Plan/Recommendation PT Assessment Clinical Impression Statement: pt is a 66 y./o male adm with slurred/ halted speech.  There is little to no evidence that the trunk or limbs have been affected along with pt's speech.  Mild incoordination of R LE was note with gait that did not affect his safety.  Will not pick pt up at this tme.  No f/u needed. PT Recommendation/Assessment: Patent does not need any further PT services No Skilled PT: Patient will have necessary level of assist by caregiver at discharge;Patient at baseline level of functioning PT Recommendation Follow Up Recommendations: No PT follow up Equipment Recommended: None recommended by PT PT Goals     PT Evaluation Precautions/Restrictions    Prior Functioning  Home Living Lives With: Spouse Receives Help From: Family Type of Home: House Home Adaptive Equipment: None Prior Function Level of Independence: Independent with basic ADLs;Independent with gait;Independent with transfers Able to Take Stairs?: Yes Driving: Yes Vocation: Full time employment Cognition Cognition Arousal/Alertness: Awake/alert Overall Cognitive Status: Appears within functional limits for tasks assessed Orientation Level: Oriented X4 Sensation/Coordination Coordination Gross Motor Movements are Fluid and Coordinated:  Yes Fine Motor Movements are Fluid and Coordinated: Yes Extremity Assessment RUE Assessment RUE Assessment: Within Functional Limits LUE Assessment LUE Assessment: Within Functional Limits RLE Assessment RLE Assessment: Within Functional Limits LLE Assessment LLE Assessment: Within Functional Limits Mobility (including Balance) Bed Mobility Bed Mobility: No Transfers Transfers: Yes Sit to Stand: 7: Independent Stand to Sit: 7: Independent Ambulation/Gait Ambulation/Gait: Yes Ambulation/Gait Assistance: 7: Independent Ambulation Distance (Feet): 250 Feet Assistive device: None Gait Pattern: Within Functional Limits (but with ?fatigue R LE became mildly less coordinated) Stairs: Yes Stairs Assistance: 5: Supervision Stair Management Technique: One rail Right;One rail Left;Alternating pattern;Other (comment) (more guarded coming down on his arthritic knees) Number of Stairs: 3   Posture/Postural Control Posture/Postural Control: No significant limitations Balance Balance Assessed:  (generally stable gait) Exercise    End of Session PT - End of Session Activity Tolerance: Patient tolerated treatment well Patient left: in chair;with call bell in reach;with family/visitor present Nurse Communication: Mobility status for ambulation;Mobility status for transfers General Behavior During Session: [facility] for tasks performed Cognition: [facility] for tasks performed  Nollie Terlizzi, Eliseo Gum 09/06/2011, 1:00 PM  09/06/2011  [place] Bing, PT [phone] (pager)

## 2011-09-07 DIAGNOSIS — I639 Cerebral infarction, unspecified: Secondary | ICD-10-CM | POA: Diagnosis present

## 2011-09-07 LAB — URINALYSIS, ROUTINE W REFLEX MICROSCOPIC
Bilirubin Urine: NEGATIVE
Hgb urine dipstick: NEGATIVE
Ketones, ur: NEGATIVE mg/dL
Nitrite: NEGATIVE
pH: 5.5 (ref 5.0–8.0)

## 2011-09-07 MED ORDER — CLOPIDOGREL BISULFATE 75 MG PO TABS: 75.0000 mg | ORAL_TABLET | Freq: Every day | ORAL | Status: DC

## 2011-09-07 NOTE — Progress Notes (Signed)
VASCULAR LAB PRELIMINARY  PRELIMINARY  PRELIMINARY  PRELIMINARY Carotid duplex completed. Bilateral:  No evidence of hemodynamically significant internal carotid artery stenosis.   Vertebral artery flow is antegrade.       Patrick Miranda D,RVS 09/07/2011, 10:26 AM

## 2011-09-07 NOTE — Discharge Summary (Signed)
Physician Discharge Summary  Patient ID: Patrick Miranda MRN: [id number] DOB/AGE: 1945-12-15 66 y.o.  Admit date: 09/05/2011 Discharge date: 09/07/2011   Discharge Diagnoses:  Principal Problem:  *Brainstem stroke Active Problems:  Dysarthria  Chronic kidney disease (CKD), stage II (mild)  DM (diabetes mellitus), type 2  Hypertension  Hyperlipidemia  Obstructive sleep apnea  Anemia   Discharged Condition: good  Hospital Course:  The patient is a 66 year old white man who is right-handed, and for the past few days has had intermittent and now persistent difficulty with pronouncing words and producing speech. He had not had any problems with headaches, vision change, weakness, gait unsteadiness, palpitations, or chest discomfort. He has no history of cerebrovascular disease. He does have a history of diabetes mellitus, type II, hypertension, hyperlipidemia, and obstructive sleep apnea for which is on CPAP treatment. Because of his persistent difficulty with speech, he presented to the emergency room for evaluation with initial workup including labs and a CT scan of the head that showed no acute abnormalities, as was an EKG that showed normal sinus rhythm. At the time of my evaluation he is most frustrated with difficulties with speech. He denied any problems with ambulation. He is on aspirin 81 mg daily for cardiovascular disease prophylaxis.  He was admitted to a medical bed with telemetry. Telemetry showed that he stayed in a normal sinus rhythm. He also had multiple procedures done including a carotid ultrasound exam with preliminary reading showing a normal study, CT scan of the head without IV contrast, and a MRI scan of the brain without contrast that showed a small to moderate-sized right paracentral pontine acute infarct with moderate small vessel ischemic changes and global atrophy without hydrocephalus. He was seen by a physical therapist who did not note any strength or gait  problems, and he was also seen by a speech pathologist who did not note any swallowing difficulty. He did not have an echocardiogram done as this was not available on the weekend. On the day of discharge he was seeing well, and not having any problems with swallowing, headaches, or ambulation. There were no complications during this hospitalization.  Consults: None  Significant Diagnostic Studies:  Dg Chest 2 View  09/05/2011  *RADIOLOGY REPORT*  Clinical Data: Slurred speech.  Right arm pain.  CHEST - 2 VIEW  Comparison: None.  Findings: Two views of the chest were obtained. Heart and mediastinum are within normal limits.  The trachea is midline.  The lungs are clear.  Mild degenerative changes in the thoracic spine.  IMPRESSION: No acute chest findings.  Original Report Authenticated By: Richarda Overlie, M.D.   Ct Head Wo Contrast  09/05/2011  *RADIOLOGY REPORT*  Clinical Data: Difficulty walking.  Dysphagia.Diabetes and hypertension.  CT HEAD WITHOUT CONTRAST  Technique:  Contiguous axial images were obtained from the base of the skull through the vertex without contrast.  Comparison: None.  Findings: Bone windows demonstrate minimal mucosal thickening of ethmoid air cells.  Cerumen within the right external ear canal.  Soft tissue windows demonstrate advanced vertebral and carotid atherosclerosis.  Mild but age advanced low density in the periventricular white matter likely related to small vessel disease. No  mass lesion, hemorrhage, hydrocephalus, acute infarct, intra-axial, or extra-axial fluid collection.  IMPRESSION:  1. No acute intracranial abnormality. 2.  Small vessel ischemic change.  Original Report Authenticated By: Consuello Bossier, M.D.   Mr Brain Wo Contrast  09/06/2011  *RADIOLOGY REPORT*  Clinical Data: Dysphasia.  Dysarthria.  Hypertension.  Diabetic.  MRI HEAD WITHOUT CONTRAST  Technique:  Multiplanar, multiecho pulse sequences of the brain and surrounding structures were obtained according  to standard protocol without intravenous contrast.  Comparison: 09/05/2011 CT.  No comparison MR.  Findings: Small to moderate-size right paracentral pontine acute infarct.  No intracranial hemorrhage.  Moderate small vessel disease type changes.  Global atrophy without hydrocephalus.  No intracranial mass lesion detected on this unenhanced exam.  Major intracranial vascular structures are patent.  Markedly ectatic intracranial vasculature most notable involving the right vertebral artery and basilar artery with indentation upon the pons and hypothalamic region.  Atherosclerotic type changes of the left vertebral artery.  Mild paranasal sinus mucosal thickening.  IMPRESSION: Small to moderate-size right paracentral pontine acute infarct.  Moderate small vessel disease type changes.  Global atrophy without hydrocephalus.  Original Report Authenticated By: Fuller Canada, M.D.    Labs: Lab Results  Component Value Date   WBC 8.9 09/06/2011   HGB 10.6* 09/06/2011   HCT 31.9* 09/06/2011   MCV 91.7 09/06/2011   PLT 294 09/06/2011     Lab 09/06/11 0600  NA 141  K 3.8  CL 111  CO2 23  BUN 24*  CREATININE 1.21  CALCIUM 8.9  PROT 5.4*  BILITOT 0.2*  ALKPHOS 66  ALT 8  AST 17  GLUCOSE 95       No results found for this basename: INR, PROTIME     No results found for this or any previous visit (from the past 240 hour(s)).    Discharge Exam: Blood pressure 137/66, pulse 73, temperature 98.3 F (36.8 C), temperature source Oral, resp. rate 16, height 5\' 10"  (1.778 m), weight 81.9 kg (180 lb 8.9 oz), SpO2 99.00%.  Physical Exam: In general, he is a well-nourished well-developed white man who was in no apparent distress while sitting upright in the chair. HEENT exam was within normal limits, neck was supple without jugular venous distention or carotid bruit, chest was clear to auscultation, heart had a regular rate and rhythm without significant murmur or gallop, abdomen had normal bowel sounds  and no hepatosplenomegaly or tenderness, extremities were without cyanosis, clubbing, or edema. Neurologic exam: He was alert and well oriented with normal affect, cranial nerves II through XII are normal, sensory exam was grossly normal, a Romberg test was normal, motor strength was 5 out of 5 in all extremities, he had minimal bilateral pass pointing on finger to nose testing, he was able to walk a short distance in his room independently with no difficulty.  Disposition: He'll be discharged to home today. I've asked him to schedule a followup visit with his primary care physician, Dr. Geoffry Paradise, within the next week. I've asked him to hold back on returning to work until his evaluation with Dr. Jacky Kindle.  Discharge Orders    Future Orders Please Complete By Expires   Diet - low sodium heart healthy      Increase activity slowly      Discharge instructions      Comments:   Call Dr. Lanell Matar office for an appointment within the next week, at [phone]. Call the physician on call ASAP for worsening symptoms, unusual bleeding, moderate headache.   Call MD for:      Comments:   Call physician for problems with headaches, change in vision, unsteady gait, unusual bleeding, or other concerning symptoms.     Medication List  As of 09/07/2011 10:57 AM   STOP taking these medications  aspirin EC 81 MG tablet      Diclofenac Sodium CR 100 MG 24 hr tablet         TAKE these medications         carvedilol 12.5 MG tablet   Commonly known as: COREG   Take 12.5 mg by mouth 2 (two) times daily with a meal.      clopidogrel 75 MG tablet   Commonly known as: PLAVIX   Take 1 tablet (75 mg total) by mouth daily with breakfast.      glyBURIDE-metformin 5-500 MG per tablet   Commonly known as: GLUCOVANCE   Take 1-2 tablets by mouth 3 (three) times daily. Take 1 tablet at breakfast and lunch, and 2 tablets at dinner.      losartan-hydrochlorothiazide 100-25 MG per tablet   Commonly known  as: HYZAAR   Take 1 tablet by mouth daily.      meloxicam 15 MG tablet   Commonly known as: MOBIC   Take 15 mg by mouth daily.      pravastatin 40 MG tablet   Commonly known as: PRAVACHOL   Take 40 mg by mouth daily.           Follow-up Information    Follow up with ARONSON,RICHARD A, MD. Schedule an appointment as soon as possible for a visit in 1 week.         Signed: Garlan Fillers 09/07/2011, 10:57 AM

## 2011-09-10 ENCOUNTER — Ambulatory Visit: Payer: Medicare Other | Attending: Internal Medicine | Admitting: Physical Therapy

## 2011-09-10 ENCOUNTER — Ambulatory Visit: Payer: Medicare Other | Admitting: Occupational Therapy

## 2011-09-10 DIAGNOSIS — I69998 Other sequelae following unspecified cerebrovascular disease: Secondary | ICD-10-CM | POA: Insufficient documentation

## 2011-09-10 DIAGNOSIS — I69922 Dysarthria following unspecified cerebrovascular disease: Secondary | ICD-10-CM | POA: Insufficient documentation

## 2011-09-10 DIAGNOSIS — M6281 Muscle weakness (generalized): Secondary | ICD-10-CM | POA: Insufficient documentation

## 2011-09-10 DIAGNOSIS — I69919 Unspecified symptoms and signs involving cognitive functions following unspecified cerebrovascular disease: Secondary | ICD-10-CM | POA: Insufficient documentation

## 2011-09-10 DIAGNOSIS — R279 Unspecified lack of coordination: Secondary | ICD-10-CM | POA: Insufficient documentation

## 2011-09-10 DIAGNOSIS — Z5189 Encounter for other specified aftercare: Secondary | ICD-10-CM | POA: Insufficient documentation

## 2011-09-12 ENCOUNTER — Emergency Department (INDEPENDENT_AMBULATORY_CARE_PROVIDER_SITE_OTHER)
Admission: EM | Admit: 2011-09-12 | Discharge: 2011-09-12 | Disposition: A | Discharge status: Home / Self Care | Payer: Medicare Other | Source: Home / Self Care | Attending: Emergency Medicine | Admitting: Emergency Medicine

## 2011-09-12 ENCOUNTER — Encounter (HOSPITAL_COMMUNITY): Payer: Self-pay

## 2011-09-12 DIAGNOSIS — J309 Allergic rhinitis, unspecified: Secondary | ICD-10-CM

## 2011-09-12 DIAGNOSIS — H6120 Impacted cerumen, unspecified ear: Secondary | ICD-10-CM

## 2011-09-12 HISTORY — DX: Brain stem stroke syndrome: G46.3

## 2011-09-12 HISTORY — DX: Cerebrovascular disease, unspecified: I67.9

## 2011-09-12 MED ORDER — FLUTICASONE PROPIONATE 50 MCG/ACT NA SUSP: 2.0000 | Freq: Every day | NASAL | Status: DC

## 2011-09-12 MED ORDER — FEXOFENADINE HCL 180 MG PO TABS: 180.0000 mg | ORAL_TABLET | Freq: Every day | ORAL | Status: DC

## 2011-09-12 NOTE — ED Notes (Addendum)
C/o has 2 day duration of nose running, ?allergies vs sinus? Issue; clear secretions, no facial discomfort; recent "mini stroke" Also c/o ears are stopped up; reportedly told by usual provider he had a lot of wax, but nothing was done

## 2011-09-12 NOTE — ED Notes (Signed)
Patient has ear wax removal by Nando

## 2011-09-12 NOTE — Discharge Instructions (Signed)
Take the medication as written. Start using the [place] med sinus rinse or the Neti pot to help with your nasal congestion, and  To clear any irritants from your nose. Return if you have a fever above 100.4, if you get worse, or for any other concerns.

## 2011-09-12 NOTE — ED Provider Notes (Signed)
History     CSN: [id number]  Arrival date & time 09/12/11  0944   First MD Initiated Contact with Patient 09/12/11 [phone]      Chief Complaint  Patient presents with  . Sinusitis    (Consider location/radiation/quality/duration/timing/severity/associated sxs/prior treatment) HPI Comments: Patient with 2 complaints. First, he reports clear rhinorrhea, itchy, watery eyes, sneezing, sore, irritated throat for several days. Patient has history seasonal allergies, and states that he gets bad around this time of year. Patient is wanting to know what he can take for his symptoms. No headaches, nausea, vomiting, fevers, sinus pain/pressure, purulent nasal discharge, ear pain. Patient also reports decreased hearing in his right ear. Pt was told by his PMD that he had wax buildup in this ear, but states his doctor didn't do anything for this. Patient has not tried anything for this. No otorrhea, denies foreign body insertion.  Patient is a 66 y.o. male presenting with plugged ear sensation. The history is provided by the patient and the spouse.  Ear Fullness This is a recurrent problem. The current episode started more than 1 week ago. The problem occurs constantly. The problem has been gradually worsening. Pertinent negatives include no chest pain, no headaches and no shortness of breath. The symptoms are aggravated by nothing. The symptoms are relieved by nothing. He has tried nothing for the symptoms. The treatment provided no relief.    Past Medical History  Diagnosis Date  . Diabetes mellitus   . Hypertension   . Arthritis   . Hyperlipidemia   . Sleep apnea     on cpap at home  . Cerebral artery disease   . Brain stem stroke syndrome     History reviewed. No pertinent past surgical history.  History reviewed. No pertinent family history.  History  Substance Use Topics  . Smoking status: Never Smoker   . Smokeless tobacco: Not on file  . Alcohol Use: No      Review of Systems    Constitutional: Negative for fever.  HENT: Positive for congestion, sore throat, rhinorrhea, sneezing and postnasal drip. Negative for nosebleeds, facial swelling, trouble swallowing and voice change.   Eyes: Positive for itching. Negative for redness.  Respiratory: Negative for cough, shortness of breath and wheezing.   Cardiovascular: Negative for chest pain.  Gastrointestinal: Negative for nausea and vomiting.  Neurological: Negative for headaches.    Allergies  Review of patient's allergies indicates no known allergies.  Home Medications   Current Outpatient Rx  Name Route Sig Dispense Refill  . CARVEDILOL 12.5 MG PO TABS Oral Take 12.5 mg by mouth 2 (two) times daily with a meal.    . CLOPIDOGREL BISULFATE 75 MG PO TABS Oral Take 1 tablet (75 mg total) by mouth daily with breakfast. 30 tablet 12  . FEXOFENADINE HCL 180 MG PO TABS Oral Take 1 tablet (180 mg total) by mouth daily. 14 tablet 0  . FLUTICASONE PROPIONATE 50 MCG/ACT NA SUSP Nasal Place 2 sprays into the nose daily. 16 g 0  . GLYBURIDE-METFORMIN 5-500 MG PO TABS Oral Take 1-2 tablets by mouth 3 (three) times daily. Take 1 tablet at breakfast and lunch, and 2 tablets at dinner.    Marland Kitchen LOSARTAN POTASSIUM-HCTZ 100-25 MG PO TABS Oral Take 1 tablet by mouth daily.    Marland Kitchen PRAVASTATIN SODIUM 40 MG PO TABS Oral Take 40 mg by mouth daily.      BP 149/80  Pulse 68  Temp(Src) 98.1 F (36.7 C) (Oral)  Resp  14  SpO2 98%  Physical Exam  Nursing note and vitals reviewed. Constitutional: He is oriented to person, place, and time. He appears well-developed and well-nourished.  HENT:  Head: Normocephalic and atraumatic.  Right Ear: Ear canal normal. Decreased hearing is noted.  Left Ear: Hearing, tympanic membrane and ear canal normal.  Nose: Mucosal edema and rhinorrhea present. No epistaxis.  Mouth/Throat: Uvula is midline and mucous membranes are normal. Posterior oropharyngeal erythema present. No oropharyngeal exudate.        Unable to visualize right TM. Canal occluded with wax. Pale boggy nasal mucosa. Clear rhinorrhea No sinus tenderness  Eyes: Conjunctivae and EOM are normal. Pupils are equal, round, and reactive to light.  Neck: Normal range of motion. Neck supple.  Cardiovascular: Normal rate, regular rhythm, normal heart sounds and intact distal pulses.   Pulmonary/Chest: Effort normal and breath sounds normal. No respiratory distress.  Abdominal: Soft. Bowel sounds are normal. He exhibits no distension. There is no tenderness.  Musculoskeletal: Normal range of motion. He exhibits no edema and no tenderness.  Lymphadenopathy:    He has no cervical adenopathy.  Neurological: He is alert and oriented to person, place, and time.  Skin: Skin is warm and dry. No rash noted.  Psychiatric: He has a normal mood and affect. His behavior is normal. Judgment and thought content normal.    ED Course  Procedures (including critical care time)  Labs Reviewed - No data to display No results found.   1. Allergic rhinitis   2. Cerumen impaction       MDM  Previous labs, records reviewed. Patient was admitted to the hospital on 2/15 for shoulder pain, dysarthria. Patient was noted to have slurred speech. MRI showed brain stem stroke. The patient had an unremarkable hospital stay was discharged with followup with his PMD.,   Patient with allergic rhinitis. Will start him on Allegra, Flonase for this. Has cerumen impaction right side. Will have his right  ears irrigated and we'll reevaluate. Gave patient Debrox for him to use at home. On reevaluation, right TM intact. No signs of infection. Hearing improved.  Luiz Blare, MD 09/12/11 [phone]

## 2011-09-16 ENCOUNTER — Ambulatory Visit: Payer: Medicare Other | Admitting: Physical Therapy

## 2011-09-16 ENCOUNTER — Ambulatory Visit: Payer: Medicare Other | Admitting: *Deleted

## 2011-09-18 ENCOUNTER — Ambulatory Visit: Payer: Medicare Other

## 2011-09-19 ENCOUNTER — Ambulatory Visit: Payer: Medicare Other | Admitting: Occupational Therapy

## 2011-09-19 ENCOUNTER — Ambulatory Visit: Payer: Medicare Other | Attending: Internal Medicine | Admitting: Physical Therapy

## 2011-09-19 DIAGNOSIS — R279 Unspecified lack of coordination: Secondary | ICD-10-CM | POA: Insufficient documentation

## 2011-09-19 DIAGNOSIS — Z5189 Encounter for other specified aftercare: Secondary | ICD-10-CM | POA: Insufficient documentation

## 2011-09-19 DIAGNOSIS — M6281 Muscle weakness (generalized): Secondary | ICD-10-CM | POA: Insufficient documentation

## 2011-09-19 DIAGNOSIS — I69922 Dysarthria following unspecified cerebrovascular disease: Secondary | ICD-10-CM | POA: Insufficient documentation

## 2011-09-19 DIAGNOSIS — I69998 Other sequelae following unspecified cerebrovascular disease: Secondary | ICD-10-CM | POA: Insufficient documentation

## 2011-09-19 DIAGNOSIS — I69919 Unspecified symptoms and signs involving cognitive functions following unspecified cerebrovascular disease: Secondary | ICD-10-CM | POA: Insufficient documentation

## 2011-09-22 ENCOUNTER — Ambulatory Visit: Payer: Medicare Other | Admitting: Speech Pathology

## 2011-09-24 ENCOUNTER — Ambulatory Visit: Payer: Medicare Other | Admitting: Occupational Therapy

## 2011-09-24 ENCOUNTER — Ambulatory Visit: Payer: Medicare Other | Admitting: Speech Pathology

## 2011-09-24 ENCOUNTER — Ambulatory Visit: Payer: Medicare Other | Admitting: Physical Therapy

## 2011-09-26 ENCOUNTER — Ambulatory Visit: Payer: Medicare Other | Admitting: Occupational Therapy

## 2011-09-26 ENCOUNTER — Ambulatory Visit: Payer: Medicare Other | Admitting: Physical Therapy

## 2011-09-30 ENCOUNTER — Ambulatory Visit: Payer: Medicare Other

## 2011-09-30 ENCOUNTER — Ambulatory Visit: Payer: Medicare Other | Admitting: Occupational Therapy

## 2011-09-30 ENCOUNTER — Ambulatory Visit: Payer: Medicare Other | Admitting: Physical Therapy

## 2011-10-01 ENCOUNTER — Encounter: Payer: Medicare Other | Admitting: Speech Pathology

## 2011-10-01 ENCOUNTER — Ambulatory Visit: Payer: Medicare Other | Admitting: Physical Therapy

## 2011-10-01 ENCOUNTER — Ambulatory Visit: Payer: Medicare Other | Admitting: Occupational Therapy

## 2011-10-06 ENCOUNTER — Encounter: Payer: Medicare Other | Admitting: Speech Pathology

## 2011-10-06 ENCOUNTER — Ambulatory Visit: Payer: Medicare Other | Admitting: Physical Therapy

## 2011-10-06 ENCOUNTER — Ambulatory Visit: Payer: Medicare Other | Admitting: Occupational Therapy

## 2011-10-08 ENCOUNTER — Ambulatory Visit: Payer: Medicare Other | Admitting: Physical Therapy

## 2011-10-08 ENCOUNTER — Encounter: Payer: Medicare Other | Admitting: Speech Pathology

## 2011-10-08 ENCOUNTER — Encounter: Payer: Medicare Other | Admitting: Occupational Therapy

## 2011-10-13 ENCOUNTER — Encounter: Payer: Medicare Other | Admitting: Occupational Therapy

## 2011-10-13 ENCOUNTER — Ambulatory Visit: Payer: Medicare Other | Admitting: Physical Therapy

## 2011-10-15 ENCOUNTER — Ambulatory Visit: Payer: Medicare Other | Admitting: Physical Therapy

## 2011-10-15 ENCOUNTER — Encounter: Payer: Medicare Other | Admitting: Occupational Therapy

## 2012-03-20 ENCOUNTER — Encounter (HOSPITAL_COMMUNITY): Payer: Self-pay | Admitting: Emergency Medicine

## 2012-03-20 ENCOUNTER — Emergency Department (HOSPITAL_COMMUNITY)
Admission: EM | Admit: 2012-03-20 | Discharge: 2012-03-20 | Disposition: A | Discharge status: Home / Self Care | Payer: Medicare Other | Attending: Emergency Medicine | Admitting: Emergency Medicine

## 2012-03-20 DIAGNOSIS — Z888 Allergy status to other drugs, medicaments and biological substances status: Secondary | ICD-10-CM | POA: Insufficient documentation

## 2012-03-20 DIAGNOSIS — L03119 Cellulitis of unspecified part of limb: Secondary | ICD-10-CM

## 2012-03-20 DIAGNOSIS — E119 Type 2 diabetes mellitus without complications: Secondary | ICD-10-CM | POA: Insufficient documentation

## 2012-03-20 DIAGNOSIS — I10 Essential (primary) hypertension: Secondary | ICD-10-CM | POA: Insufficient documentation

## 2012-03-20 DIAGNOSIS — L02419 Cutaneous abscess of limb, unspecified: Secondary | ICD-10-CM | POA: Insufficient documentation

## 2012-03-20 LAB — CBC WITH DIFFERENTIAL/PLATELET
Eosinophils Absolute: 0.3 10*3/uL (ref 0.0–0.7)
Hemoglobin: 11.2 g/dL — ABNORMAL LOW (ref 13.0–17.0)
Lymphocytes Relative: 28 % (ref 12–46)
Lymphs Abs: 2.3 10*3/uL (ref 0.7–4.0)
Neutro Abs: 5 10*3/uL (ref 1.7–7.7)
Neutrophils Relative %: 60 % (ref 43–77)
Platelets: 290 10*3/uL (ref 150–400)
RBC: 3.62 MIL/uL — ABNORMAL LOW (ref 4.22–5.81)
WBC: 8.3 10*3/uL (ref 4.0–10.5)

## 2012-03-20 LAB — BASIC METABOLIC PANEL
CO2: 27 mEq/L (ref 19–32)
Chloride: 100 mEq/L (ref 96–112)
GFR calc non Af Amer: 52 mL/min — ABNORMAL LOW (ref >90)
Glucose, Bld: 261 mg/dL — ABNORMAL HIGH (ref 70–99)
Potassium: 3.8 mEq/L (ref 3.5–5.1)
Sodium: 137 mEq/L (ref 135–145)

## 2012-03-20 MED ORDER — VANCOMYCIN HCL IN DEXTROSE 1-5 GM/200ML-% IV SOLN
1000.0000 mg | Freq: Once | INTRAVENOUS | Status: AC
  Administered 2012-03-20: 1000 mg via INTRAVENOUS
  Filled 2012-03-20: qty 200

## 2012-03-20 MED ORDER — SULFAMETHOXAZOLE-TRIMETHOPRIM 800-160 MG PO TABS: 2.0000 | ORAL_TABLET | Freq: Two times a day (BID) | ORAL | Status: AC

## 2012-03-20 MED ORDER — CEPHALEXIN 500 MG PO CAPS: 500.0000 mg | ORAL_CAPSULE | Freq: Four times a day (QID) | ORAL | Status: AC

## 2012-03-20 NOTE — ED Notes (Signed)
Pt states understanding of discharge instruction 

## 2012-03-20 NOTE — ED Provider Notes (Addendum)
History  This chart was scribed for Derwood Kaplan, MD by Erskine Emery. This patient was seen in room TR07C/TR07C and the patient's care was started at 15:00.   CSN: [id number]  Arrival date & time 03/20/12  1352   None     Chief Complaint  Patient presents with  . Abscess    right thigh    (Consider location/radiation/quality/duration/timing/severity/associated sxs/prior treatment) The history is provided by the patient. No language interpreter was used.  Patrick Miranda is a 66 y.o. male who presents to the Emergency Department complaining of an abscess on the right thigh, first noticed as a pimple 1 week ago. Pt reports his phone and keys in his pocket rub against it and a couple of days ago they popped the pimple that then drained white fluid. Pt denies any associated nausea, vomiting, fever, chills, dysuria, hematuria, difficulty urinating, abdominal pain, chest pain, or SOB. Pt has not yet seen a physician for this complaint. Pt denies any h/o similar episodes other than an infection he got on his finger a while ago. Pt has a h/o stroke (this past February) and DM for which he takes Glucovance (2 500 x2/day). Dr. Jacky Kindle is the pt's PCP.  Past Medical History  Diagnosis Date  . Diabetes mellitus   . Hypertension   . Arthritis   . Hyperlipidemia   . Sleep apnea     on cpap at home  . Cerebral artery disease   . Brain stem stroke syndrome     History reviewed. No pertinent past surgical history.  No family history on file.  History  Substance Use Topics  . Smoking status: Never Smoker   . Smokeless tobacco: Not on file  . Alcohol Use: No      Review of Systems  Constitutional: Negative for fever and chills.  Respiratory: Negative for shortness of breath.   Cardiovascular: Negative for chest pain.  Gastrointestinal: Negative for nausea, vomiting and abdominal pain.  Genitourinary: Negative for dysuria, hematuria and difficulty urinating.  Skin:       Abscess  on the right thigh.  Neurological: Negative for weakness.    Allergies  Lipitor  Home Medications   Current Outpatient Rx  Name Route Sig Dispense Refill  . CARVEDILOL 12.5 MG PO TABS Oral Take 12.5 mg by mouth 2 (two) times daily with a meal.    . CLOPIDOGREL BISULFATE 75 MG PO TABS Oral Take 75 mg by mouth daily with breakfast.    . ESCITALOPRAM OXALATE 10 MG PO TABS Oral Take 10 mg by mouth every morning.    Marland Kitchen FEXOFENADINE HCL 180 MG PO TABS Oral Take 180 mg by mouth daily as needed. For allergy symptoms    . FLUTICASONE PROPIONATE 50 MCG/ACT NA SUSP Nasal Place 2 sprays into the nose daily as needed. For nasal congestion    . GLYBURIDE-METFORMIN 5-500 MG PO TABS Oral Take 2 tablets by mouth 2 (two) times daily.     Marland Kitchen LOSARTAN POTASSIUM-HCTZ 100-25 MG PO TABS Oral Take 1 tablet by mouth at bedtime.       BP 143/73  Pulse 85  Temp 99 F (37.2 C) (Oral)  Resp 16  SpO2 98%  Physical Exam  Nursing note and vitals reviewed. Constitutional: He is oriented to person, place, and time. He appears well-developed and well-nourished. No distress.  HENT:  Head: Normocephalic and atraumatic.  Mouth/Throat: Oropharynx is clear and moist.  Eyes: EOM are normal.  Neck: Neck supple. No tracheal deviation present.  Cardiovascular: Normal rate, regular rhythm and normal heart sounds.   Pulmonary/Chest: Effort normal. No respiratory distress.       Lungs are clear to ausculation.   Abdominal: Soft. There is no tenderness.  Musculoskeletal: Normal range of motion.  Neurological: He is alert and oriented to person, place, and time.  Skin: Skin is warm and dry.       Right thigh: an erythematous lesion, 9 cm in diameter that is warm to the touch. There's a lesion in the middle, 3 cm in diameter, with some exudate but no fluctuants. Positive induration.  Psychiatric: He has a normal mood and affect. His behavior is normal.    ED Course  Procedures (including critical care time) DIAGNOSTIC  STUDIES: Oxygen Saturation is 98% on room air, normal by my interpretation.    COORDINATION OF CARE: 15:00--I evaluated the patient and we discussed a treatment plan including incision and drainage and antibiotics to which the pt agreed. I instructed the pt to follow up with Dr. Jacky Kindle within the next couple days.  17:30--I performed incision and drainage.  INCISION AND DRAINAGE PROCEDURE NOTE: Patient identification was confirmed and verbal consent was obtained. This procedure was performed by Derwood Kaplan, MD at 5:46 PM. Site: left thigh Sterile procedures observed: Yes Needle size: 22 gauge Anesthetic used (type and amt): Lidocaine 1% with epi  Blade size: 12 Drainage: Serosanguinous, dark blood with debri Complexity: Complex Packing used: 1 inch Site anesthetized, incision made over site, wound drained and explored loculations, rinsed with copious amounts of normal saline, wound packed with sterile gauze, covered with dry, sterile dressing.  Pt tolerated procedure well without complications.  Instructions for care discussed verbally and pt provided with additional written instructions for homecare and f/u.    Labs Reviewed - No data to display No results found.   No diagnosis found.    MDM   DDX: Cellulitis Abscess Necrotizing fascitis  A/P: Pt comes in with cc of abscess. Exam shows fair size erythema with a lesion in the middle that has some exudative/granulation tissue like material. Pt reports purulent discharge at home. There is some skin breakdown atop the lesion - likely from the discharge at home. Pt has no SIRs criteria at arrival, and no systemic complains at all. I would have liked for patient to stay in the ED under cellulitis protocol, but he wants to go home. Since we have no SIRS criteria, i&d was performed, and patient is willing to stay for ivab and is reliable with a reliable f/u - so i am OK with him going home. Good d/c instructions verbalized.   The cellulitis area was marked circumferentially.     Derwood Kaplan, MD 03/20/12 1821  Derwood Kaplan, MD 03/20/12 1610

## 2012-03-20 NOTE — ED Notes (Signed)
Pt reports having 2 areas on right thigh noted beginning of this week. Pt reports last night noticed the areas "busted open", from having phone and keys in his pockets.

## 2012-03-23 LAB — WOUND CULTURE

## 2012-03-23 NOTE — ED Notes (Signed)
+  Wound. +MRSA. Patient treated with Septra DS. Sensitive to same. Per protocol MD. Will call and inform patient of +MRSA.

## 2012-03-24 ENCOUNTER — Encounter (INDEPENDENT_AMBULATORY_CARE_PROVIDER_SITE_OTHER): Payer: Self-pay | Admitting: General Surgery

## 2012-03-24 ENCOUNTER — Ambulatory Visit (INDEPENDENT_AMBULATORY_CARE_PROVIDER_SITE_OTHER): Payer: Medicare Other | Admitting: General Surgery

## 2012-03-24 VITALS — BP 132/80 | HR 66 | Temp 98.0°F | Resp 18 | Ht 70.0 in | Wt 188.0 lb

## 2012-03-24 DIAGNOSIS — L02419 Cutaneous abscess of limb, unspecified: Secondary | ICD-10-CM

## 2012-03-24 NOTE — Patient Instructions (Signed)
Call me next week at [phone] and let me know how this is healing.  If we need to have you come back in we will schedule.

## 2012-03-24 NOTE — Progress Notes (Signed)
Subjective:     Patient ID: Patrick Miranda, male   DOB: 01/24/1946, 66 y.o.   MRN: [id number]  HPI This is a 66 yom who has had a right anterior thigh abscess present for some time.  He had this opened in the er on Saturday.  Since then he has had decrease in cellulitis and this area feels better.  He is on antibiotics now.  He was seen by Dr Jacky Kindle today and sent over to make sure this is completely drained.  He denies fevers.  Review of Systems     Objective:   Physical Exam Right anterior thigh with 2x1 cm open area and some macerated skin but decreased erythema on line drawn Saturday, I probed this today and it is all open with nothing really to drain right now    Assessment:     Right thigh abscess    Plan:     I don't think there is anything else to drain   I repacked today and will have him remove this Saturday.  He will complete his antibiotics.  I asked him to call next week and if improving will follow up as needed.  I think this will heal on its own.

## 2012-03-28 NOTE — ED Notes (Signed)
Called and informed patient of +MRSA and appropriate treatment.

## 2012-08-17 ENCOUNTER — Encounter: Payer: Self-pay | Admitting: Internal Medicine

## 2012-09-08 DIAGNOSIS — E1159 Type 2 diabetes mellitus with other circulatory complications: Secondary | ICD-10-CM | POA: Diagnosis not present

## 2012-09-08 DIAGNOSIS — F329 Major depressive disorder, single episode, unspecified: Secondary | ICD-10-CM | POA: Diagnosis not present

## 2012-09-08 DIAGNOSIS — I6350 Cerebral infarction due to unspecified occlusion or stenosis of unspecified cerebral artery: Secondary | ICD-10-CM | POA: Diagnosis not present

## 2012-09-08 DIAGNOSIS — E1129 Type 2 diabetes mellitus with other diabetic kidney complication: Secondary | ICD-10-CM | POA: Diagnosis not present

## 2012-11-02 DIAGNOSIS — E1129 Type 2 diabetes mellitus with other diabetic kidney complication: Secondary | ICD-10-CM | POA: Diagnosis not present

## 2012-11-02 DIAGNOSIS — H6120 Impacted cerumen, unspecified ear: Secondary | ICD-10-CM | POA: Diagnosis not present

## 2012-11-02 DIAGNOSIS — F329 Major depressive disorder, single episode, unspecified: Secondary | ICD-10-CM | POA: Diagnosis not present

## 2012-12-22 DIAGNOSIS — I6350 Cerebral infarction due to unspecified occlusion or stenosis of unspecified cerebral artery: Secondary | ICD-10-CM | POA: Diagnosis not present

## 2012-12-22 DIAGNOSIS — E785 Hyperlipidemia, unspecified: Secondary | ICD-10-CM | POA: Diagnosis not present

## 2012-12-22 DIAGNOSIS — Z6826 Body mass index (BMI) 26.0-26.9, adult: Secondary | ICD-10-CM | POA: Diagnosis not present

## 2012-12-22 DIAGNOSIS — F329 Major depressive disorder, single episode, unspecified: Secondary | ICD-10-CM | POA: Diagnosis not present

## 2012-12-22 DIAGNOSIS — I10 Essential (primary) hypertension: Secondary | ICD-10-CM | POA: Diagnosis not present

## 2012-12-22 DIAGNOSIS — E1129 Type 2 diabetes mellitus with other diabetic kidney complication: Secondary | ICD-10-CM | POA: Diagnosis not present

## 2012-12-22 DIAGNOSIS — E1159 Type 2 diabetes mellitus with other circulatory complications: Secondary | ICD-10-CM | POA: Diagnosis not present

## 2012-12-22 DIAGNOSIS — N183 Chronic kidney disease, stage 3 unspecified: Secondary | ICD-10-CM | POA: Diagnosis not present

## 2012-12-29 DIAGNOSIS — Z5181 Encounter for therapeutic drug level monitoring: Secondary | ICD-10-CM | POA: Diagnosis not present

## 2012-12-29 DIAGNOSIS — Z7901 Long term (current) use of anticoagulants: Secondary | ICD-10-CM | POA: Diagnosis not present

## 2012-12-30 DIAGNOSIS — Z1211 Encounter for screening for malignant neoplasm of colon: Secondary | ICD-10-CM | POA: Diagnosis not present

## 2012-12-30 DIAGNOSIS — K648 Other hemorrhoids: Secondary | ICD-10-CM | POA: Diagnosis not present

## 2012-12-30 DIAGNOSIS — Z86010 Personal history of colonic polyps: Secondary | ICD-10-CM | POA: Diagnosis not present

## 2012-12-30 DIAGNOSIS — D126 Benign neoplasm of colon, unspecified: Secondary | ICD-10-CM | POA: Diagnosis not present

## 2012-12-30 DIAGNOSIS — K5730 Diverticulosis of large intestine without perforation or abscess without bleeding: Secondary | ICD-10-CM | POA: Diagnosis not present

## 2013-02-14 DIAGNOSIS — L219 Seborrheic dermatitis, unspecified: Secondary | ICD-10-CM | POA: Diagnosis not present

## 2013-03-08 DIAGNOSIS — E785 Hyperlipidemia, unspecified: Secondary | ICD-10-CM | POA: Diagnosis not present

## 2013-03-08 DIAGNOSIS — I10 Essential (primary) hypertension: Secondary | ICD-10-CM | POA: Diagnosis not present

## 2013-03-08 DIAGNOSIS — Z125 Encounter for screening for malignant neoplasm of prostate: Secondary | ICD-10-CM | POA: Diagnosis not present

## 2013-03-08 DIAGNOSIS — E1159 Type 2 diabetes mellitus with other circulatory complications: Secondary | ICD-10-CM | POA: Diagnosis not present

## 2013-03-10 DIAGNOSIS — E119 Type 2 diabetes mellitus without complications: Secondary | ICD-10-CM | POA: Diagnosis not present

## 2013-03-10 DIAGNOSIS — H2510 Age-related nuclear cataract, unspecified eye: Secondary | ICD-10-CM | POA: Diagnosis not present

## 2013-03-14 ENCOUNTER — Encounter: Payer: Self-pay | Admitting: Internal Medicine

## 2013-03-14 DIAGNOSIS — E1159 Type 2 diabetes mellitus with other circulatory complications: Secondary | ICD-10-CM | POA: Diagnosis not present

## 2013-03-14 DIAGNOSIS — Z0000 Encounter for general adult medical examination without abnormal findings: Secondary | ICD-10-CM | POA: Diagnosis not present

## 2013-03-14 DIAGNOSIS — I6350 Cerebral infarction due to unspecified occlusion or stenosis of unspecified cerebral artery: Secondary | ICD-10-CM | POA: Diagnosis not present

## 2013-03-14 DIAGNOSIS — Z23 Encounter for immunization: Secondary | ICD-10-CM | POA: Diagnosis not present

## 2013-03-14 DIAGNOSIS — N183 Chronic kidney disease, stage 3 unspecified: Secondary | ICD-10-CM | POA: Diagnosis not present

## 2013-03-14 DIAGNOSIS — E785 Hyperlipidemia, unspecified: Secondary | ICD-10-CM | POA: Diagnosis not present

## 2013-03-14 DIAGNOSIS — E1139 Type 2 diabetes mellitus with other diabetic ophthalmic complication: Secondary | ICD-10-CM | POA: Diagnosis not present

## 2013-03-14 DIAGNOSIS — E1129 Type 2 diabetes mellitus with other diabetic kidney complication: Secondary | ICD-10-CM | POA: Diagnosis not present

## 2013-03-14 DIAGNOSIS — I10 Essential (primary) hypertension: Secondary | ICD-10-CM | POA: Diagnosis not present

## 2013-04-07 DIAGNOSIS — M1710 Unilateral primary osteoarthritis, unspecified knee: Secondary | ICD-10-CM | POA: Diagnosis not present

## 2013-04-08 ENCOUNTER — Encounter (HOSPITAL_COMMUNITY): Payer: Self-pay | Admitting: Pharmacist

## 2013-04-19 ENCOUNTER — Encounter (HOSPITAL_COMMUNITY)
Admission: RE | Admit: 2013-04-19 | Discharge: 2013-04-19 | Disposition: A | Discharge status: Home / Self Care | Payer: Medicare Other | Source: Ambulatory Visit | Attending: Anesthesiology | Admitting: Anesthesiology

## 2013-04-19 ENCOUNTER — Encounter (HOSPITAL_COMMUNITY)
Admission: RE | Admit: 2013-04-19 | Discharge: 2013-04-19 | Disposition: A | Discharge status: Home / Self Care | Payer: Medicare Other | Source: Ambulatory Visit | Attending: Orthopaedic Surgery | Admitting: Orthopaedic Surgery

## 2013-04-19 ENCOUNTER — Encounter (HOSPITAL_COMMUNITY): Payer: Self-pay

## 2013-04-19 DIAGNOSIS — Z7902 Long term (current) use of antithrombotics/antiplatelets: Secondary | ICD-10-CM | POA: Diagnosis not present

## 2013-04-19 DIAGNOSIS — Z01810 Encounter for preprocedural cardiovascular examination: Secondary | ICD-10-CM | POA: Insufficient documentation

## 2013-04-19 DIAGNOSIS — Z7901 Long term (current) use of anticoagulants: Secondary | ICD-10-CM | POA: Diagnosis not present

## 2013-04-19 DIAGNOSIS — M1710 Unilateral primary osteoarthritis, unspecified knee: Secondary | ICD-10-CM | POA: Diagnosis not present

## 2013-04-19 DIAGNOSIS — G4730 Sleep apnea, unspecified: Secondary | ICD-10-CM | POA: Diagnosis present

## 2013-04-19 DIAGNOSIS — Z96659 Presence of unspecified artificial knee joint: Secondary | ICD-10-CM | POA: Diagnosis not present

## 2013-04-19 DIAGNOSIS — M25569 Pain in unspecified knee: Secondary | ICD-10-CM | POA: Diagnosis not present

## 2013-04-19 DIAGNOSIS — E785 Hyperlipidemia, unspecified: Secondary | ICD-10-CM | POA: Diagnosis present

## 2013-04-19 DIAGNOSIS — E119 Type 2 diabetes mellitus without complications: Secondary | ICD-10-CM | POA: Diagnosis not present

## 2013-04-19 DIAGNOSIS — G8918 Other acute postprocedural pain: Secondary | ICD-10-CM | POA: Diagnosis not present

## 2013-04-19 DIAGNOSIS — Z01818 Encounter for other preprocedural examination: Secondary | ICD-10-CM | POA: Insufficient documentation

## 2013-04-19 DIAGNOSIS — Z01812 Encounter for preprocedural laboratory examination: Secondary | ICD-10-CM | POA: Insufficient documentation

## 2013-04-19 DIAGNOSIS — D62 Acute posthemorrhagic anemia: Secondary | ICD-10-CM | POA: Diagnosis not present

## 2013-04-19 DIAGNOSIS — IMO0002 Reserved for concepts with insufficient information to code with codable children: Secondary | ICD-10-CM | POA: Diagnosis not present

## 2013-04-19 DIAGNOSIS — I10 Essential (primary) hypertension: Secondary | ICD-10-CM | POA: Diagnosis not present

## 2013-04-19 DIAGNOSIS — Z79899 Other long term (current) drug therapy: Secondary | ICD-10-CM | POA: Diagnosis not present

## 2013-04-19 HISTORY — DX: Major depressive disorder, single episode, unspecified: F32.9

## 2013-04-19 HISTORY — DX: Depression, unspecified: F32.A

## 2013-04-19 HISTORY — DX: Umbilical hernia without obstruction or gangrene: K42.9

## 2013-04-19 LAB — CBC
Hemoglobin: 11.8 g/dL — ABNORMAL LOW (ref 13.0–17.0)
MCH: 31.2 pg (ref 26.0–34.0)
MCV: 93.1 fL (ref 78.0–100.0)
Platelets: 328 10*3/uL (ref 150–400)
RDW: 12.9 % (ref 11.5–15.5)
WBC: 6.4 10*3/uL (ref 4.0–10.5)

## 2013-04-19 LAB — BASIC METABOLIC PANEL
Calcium: 9.4 mg/dL (ref 8.4–10.5)
Creatinine, Ser: 1.47 mg/dL — ABNORMAL HIGH (ref 0.50–1.35)
GFR calc Af Amer: 55 mL/min — ABNORMAL LOW (ref >90)
GFR calc non Af Amer: 47 mL/min — ABNORMAL LOW (ref >90)
Glucose, Bld: 179 mg/dL — ABNORMAL HIGH (ref 70–99)

## 2013-04-19 LAB — SURGICAL PCR SCREEN: Staphylococcus aureus: NEGATIVE

## 2013-04-19 LAB — TYPE AND SCREEN: ABO/RH(D): O POS

## 2013-04-19 LAB — PROTIME-INR
INR: 0.91 (ref 0.00–1.49)
Prothrombin Time: 12.1 seconds (ref 11.6–15.2)

## 2013-04-19 LAB — ABO/RH: ABO/RH(D): O POS

## 2013-04-19 LAB — APTT: aPTT: 26 seconds (ref 24–37)

## 2013-04-19 NOTE — H&P (Signed)
  Patrick Miranda is an 67 y.o. male.    Chief Complaint: left knee pain  HPI: Pt is a 67 y.o. male complaining of left knee pain for multiple years. Pain had continually increased since the beginning. X-rays in the clinic show end-stage arthritic changes of the left knee. Pt has tried various conservative treatments which have failed to alleviate their symptoms, including injections and therapy. Various options are discussed with the patient. Risks, benefits and expectations were discussed with the patient. Patient understand the risks, benefits and expectations and wishes to proceed with surgery.   PCP:  Minda Meo, MD  D/C Plans:  Home with HHPT  PMH: Past Medical History  Diagnosis Date  . Diabetes mellitus   . Hypertension   . Arthritis   . Hyperlipidemia   . Sleep apnea     on cpap at home  . Cerebral artery disease   . Brain stem stroke syndrome     PSH: No past surgical history on file.  Social History:  reports that he has never smoked. He does not have any smokeless tobacco history on file. He reports that he does not drink alcohol or use illicit drugs.  Allergies:  Allergies  Allergen Reactions  . Lipitor [Atorvastatin] Other (See Comments)    Muscle pain    Medications: No current facility-administered medications for this encounter.   Current Outpatient Prescriptions  Medication Sig Dispense Refill  . fenofibrate 160 MG tablet Take 160 mg by mouth daily.      . carvedilol (COREG) 12.5 MG tablet Take 12.5 mg by mouth 2 (two) times daily with a meal.      . clopidogrel (PLAVIX) 75 MG tablet Take 75 mg by mouth daily with breakfast.      . escitalopram (LEXAPRO) 10 MG tablet Take 20 mg by mouth daily.       Marland Kitchen glyBURIDE-metformin (GLUCOVANCE) 5-500 MG per tablet Take 2 tablets by mouth 2 (two) times daily with a meal.       . losartan-hydrochlorothiazide (HYZAAR) 100-25 MG per tablet Take 1 tablet by mouth at bedtime.         No results found for  this or any previous visit (from the past 48 hour(s)). No results found.  ROS: Pain with rom of the left lower extremity  Physical Exam: Alert and oriented 67 y.o. male in no acute distress Cranial nerves 2-12 intact Cervical spine: full rom with no tenderness, nv intact distally Chest: active breath sounds bilaterally, no wheeze rhonchi or rales Heart: regular rate and rhythm, no murmur Abd: non tender non distended with active bowel sounds Hip is stable with rom  Left knee with moderate crepitus and pain with rom nv intact distally No rashes or edema Antalgic gait  Assessment/Plan Assessment: left knee pain secondary to end stage osteoarthritis  Plan: Patient will undergo a left total knee replacement by Dr. Ranell Patrick at [facility]. Risks benefits and expectations were discussed with the patient. Patient understand risks, benefits and expectations and wishes to proceed.

## 2013-04-19 NOTE — Pre-Procedure Instructions (Addendum)
Patrick Miranda  04/19/2013   Your procedure is scheduled on:  04/22/13  Report to [facility] short stay admitting at 1130 AM.  Call this number if you have problems the morning of surgery: [phone]   Remember:   Do not eat food or drink liquids after midnight.   Take these medicines the morning of surgery with A SIP OF WATER:  Carvdilolo, lexapro          STOP plavix per dr   Drucilla Schmidt not wear jewelry, make-up or nail polish.  Do not wear lotions, powders, or perfumes. You may wear deodorant.  Do not shave 48 hours prior to surgery. Men may shave face and neck.  Do not bring valuables to the hospital.  [facility] is not responsible                  for any belongings or valuables.               Contacts, dentures or bridgework may not be worn into surgery.  Leave suitcase in the car. After surgery it may be brought to your room.  For patients admitted to the hospital, discharge time is determined by your                treatment team.               Patients discharged the day of surgery will not be allowed to drive  home.  Name and phone number of your driver:  Special Instructions: Incentive Spirometry - Practice and bring it with you on the day of surgery. Shower using CHG 2 nights before surgery and the night before surgery.  If you shower the day of surgery use CHG.  Use special wash - you have one bottle of CHG for all showers.  You should use approximately 1/3 of the bottle for each shower.   Please read over the following fact sheets that you were given: Pain Booklet, Coughing and Deep Breathing, Blood Transfusion Information, Total Joint Packet, MRSA Information and Surgical Site Infection Prevention

## 2013-04-20 NOTE — Progress Notes (Signed)
Anesthesia Chart Review:  Patient is a 67 year old male scheduled for left TKA on 04/22/13 by Dr. Ranell Patrick.  History includes non-smoker, DM2, HLD, HTN, OSA, arthritis, brainstem CVA 08/2011, umbilical hernia, depression.  PCP is Dr. Geoffry Paradise who cleared patient for this procedure.  Carotid duplex on 09/07/11 showed no significant extracranial carotid artery stenosis.  Antegrade vertebral artery flow.  EKG on 04/19/13 showed NSR.  There were Q waves in lead III--minimal in aVF.  There interpreting cardiologist felt EKG was WNL.  Echo on 2/22/13showed borderline concentric LVH, normal LV systolic function, EF > 55%, no regional wall motion abnormalities.  Doppler flow pattern is normal for age.  Mildly dilated LA.  Mild MR/TR.  Trace AR/PR.  CXR on 04/19/13 showed no acute cardiopulmonary disease.  Preoperative labs noted.    If no acute changes then anticipate that he can proceed as planned.  Velna Ochs [facility] Short Stay Center/Anesthesiology Phone [phone] 04/20/2013 9:21 AM

## 2013-04-21 ENCOUNTER — Other Ambulatory Visit: Payer: Self-pay | Admitting: Internal Medicine

## 2013-04-21 DIAGNOSIS — G4733 Obstructive sleep apnea (adult) (pediatric): Secondary | ICD-10-CM

## 2013-04-21 MED ORDER — CEFAZOLIN SODIUM-DEXTROSE 2-3 GM-% IV SOLR
2.0000 g | INTRAVENOUS | Status: AC
  Administered 2013-04-22: 2 g via INTRAVENOUS
  Filled 2013-04-21: qty 50

## 2013-04-21 MED ORDER — CHLORHEXIDINE GLUCONATE 4 % EX LIQD: 60.0000 mL | Freq: Once | CUTANEOUS | Status: DC

## 2013-04-22 ENCOUNTER — Encounter (HOSPITAL_COMMUNITY): Payer: Self-pay | Admitting: *Deleted

## 2013-04-22 ENCOUNTER — Inpatient Hospital Stay (HOSPITAL_COMMUNITY): Payer: Medicare Other

## 2013-04-22 ENCOUNTER — Inpatient Hospital Stay (HOSPITAL_COMMUNITY): Payer: Medicare Other | Admitting: Anesthesiology

## 2013-04-22 ENCOUNTER — Inpatient Hospital Stay (HOSPITAL_COMMUNITY)
Admission: RE | Admit: 2013-04-22 | Discharge: 2013-04-25 | DRG: 470 | Disposition: A | Discharge status: Home Health | Payer: Medicare Other | Source: Ambulatory Visit | Attending: Orthopedic Surgery | Admitting: Orthopedic Surgery

## 2013-04-22 ENCOUNTER — Encounter (HOSPITAL_COMMUNITY): Payer: Self-pay | Admitting: Vascular Surgery

## 2013-04-22 ENCOUNTER — Encounter (HOSPITAL_COMMUNITY)
Admission: RE | Disposition: A | Discharge status: Home Health | Payer: Self-pay | Source: Ambulatory Visit | Attending: Orthopedic Surgery

## 2013-04-22 DIAGNOSIS — E119 Type 2 diabetes mellitus without complications: Secondary | ICD-10-CM | POA: Diagnosis present

## 2013-04-22 DIAGNOSIS — M1712 Unilateral primary osteoarthritis, left knee: Secondary | ICD-10-CM

## 2013-04-22 DIAGNOSIS — I10 Essential (primary) hypertension: Secondary | ICD-10-CM | POA: Diagnosis present

## 2013-04-22 DIAGNOSIS — G4730 Sleep apnea, unspecified: Secondary | ICD-10-CM | POA: Diagnosis present

## 2013-04-22 DIAGNOSIS — D62 Acute posthemorrhagic anemia: Secondary | ICD-10-CM | POA: Diagnosis not present

## 2013-04-22 DIAGNOSIS — Z01812 Encounter for preprocedural laboratory examination: Secondary | ICD-10-CM

## 2013-04-22 DIAGNOSIS — Z79899 Other long term (current) drug therapy: Secondary | ICD-10-CM

## 2013-04-22 DIAGNOSIS — M1710 Unilateral primary osteoarthritis, unspecified knee: Principal | ICD-10-CM | POA: Diagnosis present

## 2013-04-22 DIAGNOSIS — Z7901 Long term (current) use of anticoagulants: Secondary | ICD-10-CM

## 2013-04-22 DIAGNOSIS — Z7902 Long term (current) use of antithrombotics/antiplatelets: Secondary | ICD-10-CM

## 2013-04-22 DIAGNOSIS — E785 Hyperlipidemia, unspecified: Secondary | ICD-10-CM | POA: Diagnosis present

## 2013-04-22 HISTORY — PX: TOTAL KNEE ARTHROPLASTY: SHX125

## 2013-04-22 LAB — GLUCOSE, CAPILLARY: Glucose-Capillary: 250 mg/dL — ABNORMAL HIGH (ref 70–99)

## 2013-04-22 SURGERY — ARTHROPLASTY, KNEE, TOTAL
Anesthesia: Monitor Anesthesia Care | Site: Knee | Laterality: Left | Wound class: Clean

## 2013-04-22 MED ORDER — ACETAMINOPHEN 325 MG PO TABS
650.0000 mg | ORAL_TABLET | Freq: Four times a day (QID) | ORAL | Status: DC | PRN
  Administered 2013-04-23 – 2013-04-24 (×2): 650 mg via ORAL
  Filled 2013-04-22 (×2): qty 2

## 2013-04-22 MED ORDER — ONDANSETRON HCL 4 MG/2ML IJ SOLN: 4.0000 mg | Freq: Four times a day (QID) | INTRAMUSCULAR | Status: DC | PRN

## 2013-04-22 MED ORDER — ONDANSETRON HCL 4 MG PO TABS: 4.0000 mg | ORAL_TABLET | Freq: Four times a day (QID) | ORAL | Status: DC | PRN

## 2013-04-22 MED ORDER — SODIUM CHLORIDE 0.9 % IR SOLN
Status: DC | PRN
  Administered 2013-04-22: 3000 mL

## 2013-04-22 MED ORDER — HYDROCHLOROTHIAZIDE 25 MG PO TABS
25.0000 mg | ORAL_TABLET | Freq: Every day | ORAL | Status: DC
  Administered 2013-04-22 – 2013-04-24 (×3): 25 mg via ORAL
  Filled 2013-04-22 (×4): qty 1

## 2013-04-22 MED ORDER — MENTHOL 3 MG MT LOZG: 1.0000 | LOZENGE | OROMUCOSAL | Status: DC | PRN

## 2013-04-22 MED ORDER — CARVEDILOL 12.5 MG PO TABS
ORAL_TABLET | ORAL | Status: AC
  Administered 2013-04-22: 12.5 mg via ORAL
  Filled 2013-04-22: qty 1

## 2013-04-22 MED ORDER — METHOCARBAMOL 100 MG/ML IJ SOLN
500.0000 mg | Freq: Four times a day (QID) | INTRAVENOUS | Status: DC | PRN
  Filled 2013-04-22: qty 5

## 2013-04-22 MED ORDER — CELECOXIB 200 MG PO CAPS
200.0000 mg | ORAL_CAPSULE | Freq: Two times a day (BID) | ORAL | Status: DC
  Administered 2013-04-22 – 2013-04-25 (×6): 200 mg via ORAL
  Filled 2013-04-22 (×7): qty 1

## 2013-04-22 MED ORDER — CLOPIDOGREL BISULFATE 75 MG PO TABS
75.0000 mg | ORAL_TABLET | Freq: Every day | ORAL | Status: DC
  Administered 2013-04-23 – 2013-04-25 (×3): 75 mg via ORAL
  Filled 2013-04-22 (×4): qty 1

## 2013-04-22 MED ORDER — FERROUS SULFATE 325 (65 FE) MG PO TABS
325.0000 mg | ORAL_TABLET | Freq: Three times a day (TID) | ORAL | Status: DC
  Administered 2013-04-22 – 2013-04-25 (×9): 325 mg via ORAL
  Filled 2013-04-22 (×11): qty 1

## 2013-04-22 MED ORDER — ACETAMINOPHEN 650 MG RE SUPP: 650.0000 mg | Freq: Four times a day (QID) | RECTAL | Status: DC | PRN

## 2013-04-22 MED ORDER — BISACODYL 10 MG RE SUPP: 10.0000 mg | Freq: Every day | RECTAL | Status: DC | PRN

## 2013-04-22 MED ORDER — BUPIVACAINE HCL (PF) 0.75 % IJ SOLN
INTRAMUSCULAR | Status: DC | PRN
  Administered 2013-04-22: 15 mg

## 2013-04-22 MED ORDER — METOCLOPRAMIDE HCL 10 MG PO TABS: 5.0000 mg | ORAL_TABLET | Freq: Three times a day (TID) | ORAL | Status: DC | PRN

## 2013-04-22 MED ORDER — COUMADIN BOOK
Freq: Once | Status: DC
  Filled 2013-04-22: qty 1

## 2013-04-22 MED ORDER — CEFAZOLIN SODIUM-DEXTROSE 2-3 GM-% IV SOLR
2.0000 g | Freq: Four times a day (QID) | INTRAVENOUS | Status: AC
  Administered 2013-04-22 – 2013-04-23 (×2): 2 g via INTRAVENOUS
  Filled 2013-04-22 (×3): qty 50

## 2013-04-22 MED ORDER — OXYCODONE-ACETAMINOPHEN 5-325 MG PO TABS: 1.0000 | ORAL_TABLET | ORAL | Status: DC | PRN

## 2013-04-22 MED ORDER — OXYCODONE HCL 5 MG/5ML PO SOLN: 5.0000 mg | Freq: Once | ORAL | Status: DC | PRN

## 2013-04-22 MED ORDER — GLYBURIDE-METFORMIN 5-500 MG PO TABS: 2.0000 | ORAL_TABLET | Freq: Two times a day (BID) | ORAL | Status: DC

## 2013-04-22 MED ORDER — PHENOL 1.4 % MT LIQD: 1.0000 | OROMUCOSAL | Status: DC | PRN

## 2013-04-22 MED ORDER — WARFARIN - PHARMACIST DOSING INPATIENT: Freq: Every day | Status: DC

## 2013-04-22 MED ORDER — HYDROMORPHONE HCL PF 1 MG/ML IJ SOLN: 1.0000 mg | INTRAMUSCULAR | Status: DC | PRN

## 2013-04-22 MED ORDER — ESCITALOPRAM OXALATE 20 MG PO TABS
20.0000 mg | ORAL_TABLET | Freq: Every day | ORAL | Status: DC
  Administered 2013-04-22 – 2013-04-25 (×4): 20 mg via ORAL
  Filled 2013-04-22 (×4): qty 1

## 2013-04-22 MED ORDER — WARFARIN SODIUM 5 MG PO TABS
5.0000 mg | ORAL_TABLET | Freq: Once | ORAL | Status: AC
  Administered 2013-04-22: 5 mg via ORAL
  Filled 2013-04-22: qty 1

## 2013-04-22 MED ORDER — MIDAZOLAM HCL 5 MG/ML IJ SOLN: 1.0000 mg | Freq: Once | INTRAMUSCULAR | Status: AC

## 2013-04-22 MED ORDER — SODIUM CHLORIDE 0.9 % IV SOLN
INTRAVENOUS | Status: DC
  Administered 2013-04-23 (×2): via INTRAVENOUS

## 2013-04-22 MED ORDER — PHENYLEPHRINE HCL 10 MG/ML IJ SOLN
INTRAMUSCULAR | Status: DC | PRN
  Administered 2013-04-22 (×3): 80 ug via INTRAVENOUS

## 2013-04-22 MED ORDER — FENTANYL CITRATE 0.05 MG/ML IJ SOLN
INTRAMUSCULAR | Status: AC
  Administered 2013-04-22: 100 ug via INTRAVENOUS
  Filled 2013-04-22: qty 2

## 2013-04-22 MED ORDER — METHOCARBAMOL 500 MG PO TABS
500.0000 mg | ORAL_TABLET | Freq: Four times a day (QID) | ORAL | Status: DC | PRN
  Administered 2013-04-23 – 2013-04-24 (×4): 500 mg via ORAL
  Filled 2013-04-22 (×4): qty 1

## 2013-04-22 MED ORDER — LOSARTAN POTASSIUM 50 MG PO TABS
100.0000 mg | ORAL_TABLET | Freq: Every day | ORAL | Status: DC
  Administered 2013-04-22 – 2013-04-24 (×3): 100 mg via ORAL
  Filled 2013-04-22 (×4): qty 2

## 2013-04-22 MED ORDER — METFORMIN HCL 500 MG PO TABS
1000.0000 mg | ORAL_TABLET | Freq: Two times a day (BID) | ORAL | Status: DC
  Administered 2013-04-22 – 2013-04-25 (×6): 1000 mg via ORAL
  Filled 2013-04-22 (×8): qty 2

## 2013-04-22 MED ORDER — CARVEDILOL 12.5 MG PO TABS
12.5000 mg | ORAL_TABLET | Freq: Two times a day (BID) | ORAL | Status: DC
  Administered 2013-04-22 – 2013-04-24 (×5): 12.5 mg via ORAL
  Filled 2013-04-22 (×9): qty 1

## 2013-04-22 MED ORDER — LOSARTAN POTASSIUM-HCTZ 100-25 MG PO TABS: 1.0000 | ORAL_TABLET | Freq: Every day | ORAL | Status: DC

## 2013-04-22 MED ORDER — 0.9 % SODIUM CHLORIDE (POUR BTL) OPTIME
TOPICAL | Status: DC | PRN
  Administered 2013-04-22: 1000 mL

## 2013-04-22 MED ORDER — LACTATED RINGERS IV SOLN
Freq: Once | INTRAVENOUS | Status: AC
  Administered 2013-04-22: 11:00:00 via INTRAVENOUS

## 2013-04-22 MED ORDER — PROPOFOL INFUSION 10 MG/ML OPTIME
INTRAVENOUS | Status: DC | PRN
  Administered 2013-04-22: 75 ug/kg/min via INTRAVENOUS

## 2013-04-22 MED ORDER — OXYCODONE HCL 5 MG PO TABS: 5.0000 mg | ORAL_TABLET | Freq: Once | ORAL | Status: DC | PRN

## 2013-04-22 MED ORDER — HYDROMORPHONE HCL PF 1 MG/ML IJ SOLN: 0.2500 mg | INTRAMUSCULAR | Status: DC | PRN

## 2013-04-22 MED ORDER — OXYCODONE HCL 5 MG PO TABS
5.0000 mg | ORAL_TABLET | ORAL | Status: DC | PRN
  Administered 2013-04-22 – 2013-04-23 (×4): 10 mg via ORAL
  Administered 2013-04-23: 5 mg via ORAL
  Administered 2013-04-24 (×3): 10 mg via ORAL
  Filled 2013-04-22 (×7): qty 2
  Filled 2013-04-22: qty 1

## 2013-04-22 MED ORDER — WARFARIN SODIUM 5 MG PO TABS: 5.0000 mg | ORAL_TABLET | Freq: Every day | ORAL | Status: DC

## 2013-04-22 MED ORDER — MIDAZOLAM HCL 2 MG/2ML IJ SOLN
INTRAMUSCULAR | Status: AC
  Administered 2013-04-22: 1 mg
  Filled 2013-04-22: qty 2

## 2013-04-22 MED ORDER — FENOFIBRATE 160 MG PO TABS
160.0000 mg | ORAL_TABLET | Freq: Every day | ORAL | Status: DC
  Administered 2013-04-22 – 2013-04-25 (×4): 160 mg via ORAL
  Filled 2013-04-22 (×4): qty 1

## 2013-04-22 MED ORDER — LACTATED RINGERS IV SOLN
INTRAVENOUS | Status: DC | PRN
  Administered 2013-04-22 (×2): via INTRAVENOUS

## 2013-04-22 MED ORDER — BUPIVACAINE-EPINEPHRINE PF 0.5-1:200000 % IJ SOLN
INTRAMUSCULAR | Status: DC | PRN
  Administered 2013-04-22: 30 mL

## 2013-04-22 MED ORDER — METHOCARBAMOL 500 MG PO TABS: 500.0000 mg | ORAL_TABLET | Freq: Three times a day (TID) | ORAL | Status: DC | PRN

## 2013-04-22 MED ORDER — METOCLOPRAMIDE HCL 5 MG/ML IJ SOLN: 5.0000 mg | Freq: Three times a day (TID) | INTRAMUSCULAR | Status: DC | PRN

## 2013-04-22 MED ORDER — GLYBURIDE 5 MG PO TABS
10.0000 mg | ORAL_TABLET | Freq: Two times a day (BID) | ORAL | Status: DC
  Administered 2013-04-22 – 2013-04-25 (×6): 10 mg via ORAL
  Filled 2013-04-22 (×8): qty 2

## 2013-04-22 MED ORDER — CARVEDILOL 12.5 MG PO TABS
12.5000 mg | ORAL_TABLET | Freq: Once | ORAL | Status: AC
  Administered 2013-04-22: 12.5 mg via ORAL
  Filled 2013-04-22: qty 1

## 2013-04-22 MED ORDER — FENTANYL CITRATE 0.05 MG/ML IJ SOLN
100.0000 ug | Freq: Once | INTRAMUSCULAR | Status: AC
  Administered 2013-04-22: 100 ug via INTRAVENOUS

## 2013-04-22 SURGICAL SUPPLY — 63 items
BANDAGE ELASTIC 4 VELCRO ST LF (GAUZE/BANDAGES/DRESSINGS) ×2 IMPLANT
BANDAGE ESMARK 6X9 LF (GAUZE/BANDAGES/DRESSINGS) ×1 IMPLANT
BANDAGE GAUZE ELAST BULKY 4 IN (GAUZE/BANDAGES/DRESSINGS) ×4 IMPLANT
BLADE SAG 18X100X1.27 (BLADE) ×2 IMPLANT
BLADE SAW SGTL 13.0X1.19X90.0M (BLADE) ×2 IMPLANT
BNDG ELASTIC 6X10 VLCR STRL LF (GAUZE/BANDAGES/DRESSINGS) ×2 IMPLANT
BNDG ESMARK 6X9 LF (GAUZE/BANDAGES/DRESSINGS) ×2
BOWL SMART MIX CTS (DISPOSABLE) ×2 IMPLANT
CAPT RP KNEE ×2 IMPLANT
CEMENT HV SMART SET (Cement) ×4 IMPLANT
CLOTH BEACON ORANGE TIMEOUT ST (SAFETY) ×2 IMPLANT
CLSR STERI-STRIP ANTIMIC 1/2X4 (GAUZE/BANDAGES/DRESSINGS) ×2 IMPLANT
COVER SURGICAL LIGHT HANDLE (MISCELLANEOUS) ×2 IMPLANT
CUFF TOURNIQUET SINGLE 34IN LL (TOURNIQUET CUFF) ×2 IMPLANT
CUFF TOURNIQUET SINGLE 44IN (TOURNIQUET CUFF) IMPLANT
DRAPE EXTREMITY T 121X128X90 (DRAPE) ×2 IMPLANT
DRAPE PROXIMA HALF (DRAPES) ×2 IMPLANT
DRAPE U-SHAPE 47X51 STRL (DRAPES) ×2 IMPLANT
DRSG ADAPTIC 3X8 NADH LF (GAUZE/BANDAGES/DRESSINGS) ×2 IMPLANT
DRSG PAD ABDOMINAL 8X10 ST (GAUZE/BANDAGES/DRESSINGS) ×4 IMPLANT
DURAPREP 26ML APPLICATOR (WOUND CARE) ×4 IMPLANT
ELECT CAUTERY BLADE 6.4 (BLADE) ×2 IMPLANT
ELECT REM PT RETURN 9FT ADLT (ELECTROSURGICAL) ×2
ELECTRODE REM PT RTRN 9FT ADLT (ELECTROSURGICAL) ×1 IMPLANT
FACESHIELD LNG OPTICON STERILE (SAFETY) ×6 IMPLANT
GLOVE BIOGEL PI IND STRL 8 (GLOVE) ×1 IMPLANT
GLOVE BIOGEL PI INDICATOR 8 (GLOVE) ×1
GLOVE BIOGEL PI ORTHO PRO 7.5 (GLOVE) ×1
GLOVE BIOGEL PI ORTHO PRO SZ7 (GLOVE) ×1
GLOVE BIOGEL PI ORTHO PRO SZ8 (GLOVE) ×1
GLOVE ORTHO TXT STRL SZ7.5 (GLOVE) ×2 IMPLANT
GLOVE PI ORTHO PRO STRL 7.5 (GLOVE) ×1 IMPLANT
GLOVE PI ORTHO PRO STRL SZ7 (GLOVE) ×1 IMPLANT
GLOVE PI ORTHO PRO STRL SZ8 (GLOVE) ×1 IMPLANT
GLOVE SURG ORTHO 8.5 STRL (GLOVE) ×6 IMPLANT
GLOVE SURG SS PI 7.0 STRL IVOR (GLOVE) ×2 IMPLANT
GLOVE SURG SS PI 8.0 STRL IVOR (GLOVE) ×4 IMPLANT
GOWN STRL NON-REIN LRG LVL3 (GOWN DISPOSABLE) ×2 IMPLANT
GOWN STRL REIN XL XLG (GOWN DISPOSABLE) ×8 IMPLANT
HANDPIECE INTERPULSE COAX TIP (DISPOSABLE) ×1
IMMOBILIZER KNEE 22 UNIV (SOFTGOODS) ×2 IMPLANT
KIT BASIN OR (CUSTOM PROCEDURE TRAY) ×2 IMPLANT
KIT MANIFOLD (MISCELLANEOUS) ×2 IMPLANT
KIT ROOM TURNOVER OR (KITS) ×2 IMPLANT
MANIFOLD NEPTUNE II (INSTRUMENTS) ×2 IMPLANT
NS IRRIG 1000ML POUR BTL (IV SOLUTION) ×2 IMPLANT
PACK TOTAL JOINT (CUSTOM PROCEDURE TRAY) ×2 IMPLANT
PAD ARMBOARD 7.5X6 YLW CONV (MISCELLANEOUS) ×4 IMPLANT
SET HNDPC FAN SPRY TIP SCT (DISPOSABLE) ×1 IMPLANT
SPONGE GAUZE 4X4 12PLY (GAUZE/BANDAGES/DRESSINGS) ×2 IMPLANT
STRIP CLOSURE SKIN 1/2X4 (GAUZE/BANDAGES/DRESSINGS) ×4 IMPLANT
SUCTION FRAZIER TIP 10 FR DISP (SUCTIONS) ×2 IMPLANT
SUT MNCRL AB 3-0 PS2 18 (SUTURE) ×2 IMPLANT
SUT VIC AB 0 CT1 27 (SUTURE) ×2
SUT VIC AB 0 CT1 27XBRD ANBCTR (SUTURE) ×2 IMPLANT
SUT VIC AB 1 CT1 27 (SUTURE) ×2
SUT VIC AB 1 CT1 27XBRD ANBCTR (SUTURE) ×2 IMPLANT
SUT VIC AB 2-0 CT1 27 (SUTURE) ×2
SUT VIC AB 2-0 CT1 TAPERPNT 27 (SUTURE) ×2 IMPLANT
TOWEL OR 17X24 6PK STRL BLUE (TOWEL DISPOSABLE) ×2 IMPLANT
TOWEL OR 17X26 10 PK STRL BLUE (TOWEL DISPOSABLE) ×2 IMPLANT
TRAY FOLEY CATH 16FRSI W/METER (SET/KITS/TRAYS/PACK) ×2 IMPLANT
WATER STERILE IRR 1000ML POUR (IV SOLUTION) ×4 IMPLANT

## 2013-04-22 NOTE — Progress Notes (Signed)
ANTICOAGULATION CONSULT NOTE - Initial Consult  Pharmacy Consult for warfarin Indication: VTE prophylaxis  Allergies  Allergen Reactions  . Lipitor [Atorvastatin] Other (See Comments)    Muscle pain    Patient Measurements:     Vital Signs: Temp: 97.4 F (36.3 C) (10/03 1403) Temp src: Oral (10/03 1006) BP: 112/75 mmHg (10/03 1445) Pulse Rate: 55 (10/03 1445)  Labs: No results found for this basename: HGB, HCT, PLT, APTT, LABPROT, INR, HEPARINUNFRC, CREATININE, CKTOTAL, CKMB, TROPONINI,  in the last 72 hours  The CrCl is unknown because both a height and weight (above a minimum accepted value) are required for this calculation.   Medical History: Past Medical History  Diagnosis Date  . Diabetes mellitus   . Hypertension   . Arthritis   . Hyperlipidemia   . Sleep apnea     on cpap at home  . Cerebral artery disease   . 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 Hernia, umbilical   . Depression     Medications:  Scheduled:  . carvedilol  12.5 mg Oral BID WC  .  ceFAZolin (ANCEF) IV  2 g Intravenous Q6H  . celecoxib  200 mg Oral Q12H  . [START ON 04/23/2013] clopidogrel  75 mg Oral Q breakfast  . escitalopram  20 mg Oral Daily  . fenofibrate  160 mg Oral Daily  . ferrous sulfate  325 mg Oral TID PC  . glyBURIDE  10 mg Oral BID WC   And  . metFORMIN  1,000 mg Oral BID WC  . losartan  100 mg Oral Daily   And  . hydrochlorothiazide  25 mg Oral Daily    Assessment: 67 YOM s/p left TKA to start warfarin for VTE prophylaxis post-op. Not on anticoagulants PTA. 9/30 labs reveal a slightly low Hgb at 11.8, normal platelets at 328 and a baseline INR of 0.91.  Goal of Therapy:  INR 2-3 Monitor platelets by anticoagulation protocol: Yes   Plan:  1. Warfarin 5mg  po x1 tonight 2. Daily PT/INR 3. Follow CBC and for any signs/symptoms of bleeding or clotting  Nedra Mcinnis D. Rafeal Skibicki, PharmD Clinical Pharmacist Pager: [phone] 04/22/2013 3:40 PM

## 2013-04-22 NOTE — Anesthesia Preprocedure Evaluation (Addendum)
Anesthesia Evaluation  Patient identified by MRN, date of birth, ID band Patient awake    Reviewed: Allergy & Precautions, H&P , NPO status , Patient's Chart, lab work & pertinent test results, reviewed documented beta blocker date and time   Airway Mallampati: II TM Distance: >3 FB Neck ROM: Full    Dental no notable dental hx. (+) Teeth Intact and Dental Advisory Given   Pulmonary sleep apnea and Continuous Positive Airway Pressure Ventilation ,  breath sounds clear to auscultation  Pulmonary exam normal       Cardiovascular hypertension, On Medications and On Home Beta Blockers + Peripheral Vascular Disease Rhythm:Regular Rate:Normal     Neuro/Psych PSYCHIATRIC DISORDERS CVA negative neurological ROS  negative psych ROS   GI/Hepatic negative GI ROS, Neg liver ROS,   Endo/Other  diabetes, Type 2, Oral Hypoglycemic Agents  Renal/GU Renal disease  negative genitourinary   Musculoskeletal   Abdominal   Peds  Hematology negative hematology ROS (+) anemia ,   Anesthesia Other Findings   Reproductive/Obstetrics negative OB ROS                          Anesthesia Physical Anesthesia Plan  ASA: III  Anesthesia Plan: MAC, Regional and Spinal   Post-op Pain Management: MAC Combined w/ Regional for Post-op pain   Induction: Intravenous  Airway Management Planned: Simple Face Mask  Additional Equipment:   Intra-op Plan:   Post-operative Plan:   Informed Consent: I have reviewed the patients History and Physical, chart, labs and discussed the procedure including the risks, benefits and alternatives for the proposed anesthesia with the patient or authorized representative who has indicated his/her understanding and acceptance.   Dental advisory given  Plan Discussed with: CRNA and Surgeon  Anesthesia Plan Comments:        Anesthesia Quick Evaluation

## 2013-04-22 NOTE — Progress Notes (Signed)
UR COMPLETED  

## 2013-04-22 NOTE — Progress Notes (Signed)
Orthopedic Tech Progress Note Patient Details:  Patrick Miranda August 23, 1945 [id number]   cpm at 0-060 degrees; left knee trapeze bar patient helper ; footsie roll   Nikki Dom 04/22/2013, 2:25 PM

## 2013-04-22 NOTE — Evaluation (Signed)
Physical Therapy Evaluation Patient Details Name: Patrick Miranda MRN: [id number] DOB: January 03, 1946 Today's Date: 04/22/2013 Time: 4098-1191 PT Time Calculation (min): 25 min  PT Assessment / Plan / Recommendation History of Present Illness  Patient is a 67 yo male s/p Lt TKA  Clinical Impression  Patient presents with problems listed below.  Will benefit from acute PT to maximize independence prior to discharge home with wife and daughter.  Session today limited by pain and patient feeling lightheaded.  Improved by end of session.    PT Assessment  Patient needs continued PT services    Follow Up Recommendations  Home health PT;Supervision/Assistance - 24 hour    Does the patient have the potential to tolerate intense rehabilitation      Barriers to Discharge        Equipment Recommendations  None recommended by PT    Recommendations for Other Services     Frequency 7X/week    Precautions / Restrictions Precautions Precautions: Knee Precaution Booklet Issued: Yes (comment) Precaution Comments: Reviewed precautions with patient, wife and daughter Required Braces or Orthoses: Knee Immobilizer - Left Knee Immobilizer - Left: On except when in CPM Restrictions Weight Bearing Restrictions: Yes LLE Weight Bearing: Weight bearing as tolerated   Pertinent Vitals/Pain Pain limiting mobility      Mobility  Bed Mobility Bed Mobility: Supine to Sit;Sitting - Scoot to Edge of Bed Supine to Sit: 4: Min assist;With rails;HOB flat Sitting - Scoot to Edge of Bed: 4: Min guard;With rail Details for Bed Mobility Assistance: Patient with KI on LLE as PT entered room.  Verbal cues for technique for bed mobility.  Assist to move LLE off of bed.  Good balance in sitting Transfers Transfers: Sit to Stand;Stand to Sit;Stand Pivot Transfers Sit to Stand: 4: Min assist;With upper extremity assist;From bed Stand to Sit: 4: Min assist;With upper extremity assist;With armrests;To  chair/3-in-1 Stand Pivot Transfers: 4: Min assist Details for Transfer Assistance: verbal cues for hand placement and technique.  Assist to rise to standing and for balance.  Patient attempted to take steps to ambulate.  Became lightheaded and nauseated.  Pivoted to chair - cues for placement of LLE and hands to sit. Ambulation/Gait Ambulation/Gait Assistance: Not tested (comment)    Exercises Total Joint Exercises Ankle Circles/Pumps: AROM;Both;10 reps;Seated   PT Diagnosis: Difficulty walking;Acute pain  PT Problem List: Decreased strength;Decreased range of motion;Decreased activity tolerance;Decreased balance;Decreased mobility;Decreased knowledge of use of DME;Decreased knowledge of precautions;Pain PT Treatment Interventions: DME instruction;Gait training;Stair training;Functional mobility training;Therapeutic exercise;Patient/family education     PT Goals(Current goals can be found in the care plan section) Acute Rehab PT Goals Patient Stated Goal: To walk.  To go home PT Goal Formulation: With patient/family Time For Goal Achievement: 04/29/13 Potential to Achieve Goals: Good  Visit Information  Last PT Received On: 04/22/13 Assistance Needed: +1 History of Present Illness: Patient is a 67 yo male s/p Lt TKA       Prior Functioning  Home Living Family/patient expects to be discharged to:: Private residence Living Arrangements: Spouse/significant other;Children Available Help at Discharge: Family;Available 24 hours/day Type of Home: House Home Access: Stairs to enter [employer] of Steps: 1 Entrance Stairs-Rails: None Home Layout: One level Home Equipment: Walker - 2 wheels;Bedside commode Prior Function Level of Independence: Independent Communication Communication: No difficulties    Cognition  Cognition Arousal/Alertness: Awake/alert Behavior During Therapy: WFL for tasks assessed/performed Overall Cognitive Status: Within Functional Limits for  tasks assessed    Extremity/Trunk Assessment Upper  Extremity Assessment Upper Extremity Assessment: Overall WFL for tasks assessed Lower Extremity Assessment Lower Extremity Assessment: LLE deficits/detail LLE Deficits / Details: Decreased strength and ROM due to surgery and pain.  Able to assist with moving LLE off of bed. LLE: Unable to fully assess due to pain;Unable to fully assess due to immobilization Cervical / Trunk Assessment Cervical / Trunk Assessment: Normal   Balance Balance Balance Assessed: Yes Static Sitting Balance Static Sitting - Balance Support: No upper extremity supported;Feet supported Static Sitting - Level of Assistance: 5: Stand by assistance Static Sitting - Comment/# of Minutes: 3 Static Standing Balance Static Standing - Balance Support: Bilateral upper extremity supported Static Standing - Level of Assistance: 4: Min assist Static Standing - Comment/# of Minutes: 2 minutes.    End of Session PT - End of Session Equipment Utilized During Treatment: Gait belt;Left knee immobilizer Activity Tolerance: Patient limited by fatigue;Patient limited by pain Patient left: in chair;with call bell/phone within reach;with family/visitor present Nurse Communication: Mobility status (Patient lightheaded during session.  Improved by end of PT.) CPM Left Knee CPM Left Knee: Off  GP     Vena Austria 04/22/2013, 7:25 PM Durenda Hurt. Renaldo Fiddler, [facility] Acute Rehab Services Pager [phone]

## 2013-04-22 NOTE — Op Note (Signed)
NAMETYGER, WICHMAN NO.:  [id number]  MEDICAL RECORD NO.:  [id number]  LOCATION:  5N12C                        FACILITY:  [facility]  PHYSICIAN:  Almedia Balls. Ranell Patrick, M.D. DATE OF BIRTH:  05/06/1946  DATE OF PROCEDURE:  04/22/2013 DATE OF DISCHARGE:                              OPERATIVE REPORT   PREOPERATIVE DIAGNOSIS:  Left knee end-stage osteoarthritis.  POSTOPERATIVE DIAGNOSIS:  Left knee end-stage osteoarthritis.  PROCEDURE PERFORMED:  Left total knee replacement using DePuy Sigma rotating platform prosthesis.  ATTENDING SURGEON:  Almedia Balls. Ranell Patrick, M.D.  ASSISTANT:  Donnie Coffin. Dixon, PA-C who scrubbed the entire procedure and necessary for satisfactory completion of surgery.  ANESTHESIA:  Spinal anesthesia was used plus femoral block.  ESTIMATED BLOOD LOSS:  Minimal.  FLUID REPLACEMENT:  1500 mL of crystalloid.  INSTRUMENT COUNTS:  Correct.  COMPLICATIONS:  There were no complications.  ANTIBIOTICS:  Perioperative antibiotics were given.  INDICATIONS:  The patient is a 67 year old male with worsening left knee pain secondary to end-stage arthritis.  The patient has a flexion contracture and has debilitating pain in his left knee.  The patient has pain at rest, pain at night.  The patient's pain has been progressive despite conservative management with injections, anti-inflammatories, and pain medications.  The patient presents for operative treatment to restore function and eliminate pain to his left knee.  Informed consent obtained.  DESCRIPTION OF PROCEDURE:  After adequate level of anesthesia was achieved, the patient was positioned supine in the operating room table. Left leg correctly identified.  Nonsterile tourniquet placed on left proximal thigh.  After sterile prep and drape of the left leg, a time- out was called.  We then entered the knee using after exsanguination of the limb and elevation of the tourniquet to 300 mmHg.  Made  a longitudinal midline incision with a fresh 10 blade scalpel with the knee in flexion.  Dissection down through subcutaneous tissues.  Medial parapatellar arthrotomy created with a fresh 10 blade.  The lateral patellofemoral ligaments divided and the patella was everted.  We entered the distal femur using the step-cut drill.  We resected 10 mm off the distal femur, set on 5 degrees left.  Next, we sized the femur anterior down to a size 4, performed anterior, posterior, and chamfer cuts, off performed 1 block.  We then removed ACL, PCL and the remaining meniscal tissues, subluxed the tibia anteriorly and performed a perpendicular cut to the tibia 90 degrees perpendicular to the long axis with minimal posterior slope to this posterior cruciate substituting prosthesis.  Once that was done, we checked our flexion-extension gaps, which were symmetric at 12-1/2, probably a little bit more than 12-1/2 mm.  Next, we went ahead and removed excess posterior bone off the posterior femoral condyles with a 1-inch curved osteotome, and released the capsule off the notch posteriorly.  We then went ahead and finished our tibial preparation with the modular drill and keel punch.  We then went ahead and did our box cut with the box cut guide and oscillating saw for the cruciate substituting prosthesis.  We then went ahead and once we completed the femoral and tibial preparation, we placed the size 4 tibial tray,  and the size 4 left femur, reduced the knee with a 15 poly insert which was nice in terms of stability and still obtaining full range of motion.  We then resurfaced the patella, starting from a 28 mm thickness down to an 18 mm thickness and selected a 38 patella. We drilled our lugs for that, placed the patellar button in place and ranged the knee.  I had nice tracking with a no-touch technique.  We thoroughly irrigated.  We then removed all trial components, pulse irrigated the bone and then  dried thoroughly before cementing the components into place with high viscosity DePuy cement using vacuum mixing technique.  Once we placed all components in place, reduced with a 15 trial insert into full extension.  We then went ahead and cemented the patella button in place and held it with a patellar clamp until all cement was hardened on the back table.  We then went ahead and removed excess cement with quarter-inch curved osteotome.  We trialed a 17, 5 which fit perfectly and once he had nice stability in flexion, we removed the trial component.  Final inspection for loose debris and cement thoroughly irrigated and then placed a real 17, 5 polyethylene insert, and then reduced the knee.  We are happy with that.  Again with stability, we repaired the medial parapatellar arthrotomy with #1 Vicryl suture interrupted, followed by 0 and 2-0 layered subcutaneous Vicryl closure with 4-0 Monocryl for skin.  Steri-Strips applied, followed by sterile dressing.  The patient tolerated the surgery well.     Almedia Balls. Ranell Patrick, M.D.     SRN/MEDQ  D:  04/22/2013  T:  04/22/2013  Job:  [id number]

## 2013-04-22 NOTE — Interval H&P Note (Signed)
History and Physical Interval Note:  04/22/2013 11:40 AM  Patrick Miranda  has presented today for surgery, with the diagnosis of LEFT KNEE OA  The various methods of treatment have been discussed with the patient and family. After consideration of risks, benefits and other options for treatment, the patient has consented to  Procedure(s): TOTAL KNEE ARTHROPLASTY (Left) as a surgical intervention .  The patient's history has been reviewed, patient examined, no change in status, stable for surgery.  I have reviewed the patient's chart and labs.  Questions were answered to the patient's satisfaction.     Angelicia Lessner,STEVEN R

## 2013-04-22 NOTE — Preoperative (Signed)
Beta Blockers   Reason not to administer Beta Blockers:Coreg taken @ 10:28 on 04/22/13

## 2013-04-22 NOTE — Anesthesia Postprocedure Evaluation (Signed)
  Anesthesia Post-op Note  Patient: Patrick Miranda  Procedure(s) Performed: Procedure(s) with comments: TOTAL KNEE ARTHROPLASTY- left (Left) - with femoral block  Patient Location: PACU  Anesthesia Type:MAC and Spinal  Level of Consciousness: awake and alert   Airway and Oxygen Therapy: Patient Spontanous Breathing  Post-op Pain: none  Post-op Assessment: Post-op Vital signs reviewed, Patient's Cardiovascular Status Stable, Respiratory Function Stable and No residual motor weakness  Post-op Vital Signs: Reviewed and stable  Complications: No apparent anesthesia complications

## 2013-04-22 NOTE — Anesthesia Procedure Notes (Addendum)
Anesthesia Regional Block:  Femoral nerve block  Pre-Anesthetic Checklist: ,, timeout performed, Correct Patient, Correct Site, Correct Laterality, Correct Procedure, Correct Position, site marked, Risks and benefits discussed, pre-op evaluation,  At surgeon's request and post-op pain management  Laterality: Left  Prep: Maximum Sterile Barrier Precautions used and chloraprep       Needles:  Injection technique: Single-shot  Needle Type: Echogenic Stimulator Needle      Needle Gauge: 22 and 22 G    Additional Needles:  Procedures: ultrasound guided (picture in chart) Femoral nerve block  Nerve Stimulator or Paresthesia:  Response: Patellar respose,   Additional Responses:   Narrative:  Start time: 04/22/2013 10:51 AM End time: 04/22/2013 11:02 AM Injection made incrementally with aspirations every 5 mL. Anesthesiologist: Fitzgerald,MD  Additional Notes: 2% Lidocaine skin wheel.   Femoral nerve block Spinal  Patient location during procedure: OR Start time: 04/22/2013 11:50 AM End time: 04/22/2013 11:58 AM Staffing Anesthesiologist: Gaynelle Adu E Performed by: anesthesiologist  Preanesthetic Checklist Completed: patient identified, surgical consent, pre-op evaluation, timeout performed, IV checked, risks and benefits discussed and monitors and equipment checked Spinal Block Patient position: sitting Prep: Betadine Patient monitoring: cardiac monitor, continuous pulse ox and blood pressure Approach: midline Location: L3-4 Injection technique: single-shot Needle Needle type: Spinocan  Needle gauge: 25 G Needle length: 9 cm Assessment Sensory level: T10 Additional Notes Functioning IV was confirmed and monitors were applied. Sterile prep and drape, including hand hygiene and sterile gloves were used. The patient was positioned and the spine was prepped. The skin was anesthetized with lidocaine.  Free flow of clear CSF was obtained prior to injecting local  anesthetic into the CSF.  The spinal needle aspirated freely following injection.  The needle was carefully withdrawn.  The patient tolerated the procedure well.   Procedure Name: MAC Date/Time: 04/22/2013 11:58 AM Performed by: Quentin Ore Pre-anesthesia Checklist: Patient identified, Emergency Drugs available, Suction available, Patient being monitored and Timeout performed Patient Re-evaluated:Patient Re-evaluated prior to inductionOxygen Delivery Method: Simple face mask Intubation Type: IV induction Placement Confirmation: positive ETCO2

## 2013-04-22 NOTE — Transfer of Care (Signed)
Immediate Anesthesia Transfer of Care Note  Patient: Patrick Miranda  Procedure(s) Performed: Procedure(s) with comments: TOTAL KNEE ARTHROPLASTY- left (Left) - with femoral block  Patient Location: PACU  Anesthesia Type:General  Level of Consciousness: awake, alert  and oriented  Airway & Oxygen Therapy: Patient Spontanous Breathing and Patient connected to face mask oxygen  Post-op Assessment: Report given to PACU RN, Post -op Vital signs reviewed and stable and Patient moving all extremities X 4  Post vital signs: Reviewed and stable  Complications: No apparent anesthesia complications

## 2013-04-22 NOTE — Care Management Note (Addendum)
Patient preoperatively set up with [facility]. We will follow for home health needs. Ayesha Rumpf RN BSN [phone]

## 2013-04-22 NOTE — Brief Op Note (Signed)
04/22/2013  1:42 PM  PATIENT:  Mitch C Logie  67 y.o. male  PRE-OPERATIVE DIAGNOSIS:  LEFT KNEE OA, end stage  POST-OPERATIVE DIAGNOSIS:  LEFT KNEE OA, end stage  PROCEDURE:  Procedure(s) with comments: TOTAL KNEE ARTHROPLASTY- left (Left) - with femoral block DePuy Sigma RP  SURGEON:  Surgeon(s) and Role:    * Verlee Rossetti, MD - Primary  PHYSICIAN ASSISTANT:   ASSISTANTS: Thea Gist, PA-C   ANESTHESIA:   regional and spinal  EBL:  Total I/O In: 1000 [I.V.:1000] Out: 125 [Urine:125]  BLOOD ADMINISTERED:none  DRAINS: none and Gastrostomy Tube   LOCAL MEDICATIONS USED:  NONE  SPECIMEN:  No Specimen  DISPOSITION OF SPECIMEN:  N/A  COUNTS:  YES  TOURNIQUET:  * Missing tourniquet times found for documented tourniquets in log:  [id number] *  DICTATION: .Other Dictation: Dictation Number [id number]  PLAN OF CARE: Admit to inpatient   PATIENT DISPOSITION:  PACU - hemodynamically stable.   Delay start of Pharmacological VTE agent (>24hrs) due to surgical blood loss or risk of bleeding: no

## 2013-04-23 LAB — CBC
Hemoglobin: 9.7 g/dL — ABNORMAL LOW (ref 13.0–17.0)
MCV: 91.5 fL (ref 78.0–100.0)
Platelets: 306 10*3/uL (ref 150–400)
RBC: 3.17 MIL/uL — ABNORMAL LOW (ref 4.22–5.81)
WBC: 8.5 10*3/uL (ref 4.0–10.5)

## 2013-04-23 LAB — PROTIME-INR: INR: 1.11 (ref 0.00–1.49)

## 2013-04-23 LAB — GLUCOSE, CAPILLARY
Glucose-Capillary: 221 mg/dL — ABNORMAL HIGH (ref 70–99)
Glucose-Capillary: 261 mg/dL — ABNORMAL HIGH (ref 70–99)

## 2013-04-23 LAB — BASIC METABOLIC PANEL
CO2: 25 mEq/L (ref 19–32)
Calcium: 8 mg/dL — ABNORMAL LOW (ref 8.4–10.5)
Chloride: 103 mEq/L (ref 96–112)
Creatinine, Ser: 1.46 mg/dL — ABNORMAL HIGH (ref 0.50–1.35)
Glucose, Bld: 252 mg/dL — ABNORMAL HIGH (ref 70–99)
Sodium: 138 mEq/L (ref 135–145)

## 2013-04-23 MED ORDER — WARFARIN SODIUM 5 MG PO TABS
5.0000 mg | ORAL_TABLET | Freq: Once | ORAL | Status: AC
  Administered 2013-04-23: 5 mg via ORAL
  Filled 2013-04-23: qty 1

## 2013-04-23 NOTE — Progress Notes (Signed)
Subjective: 1 Day Post-Op Procedure(s) (LRB): TOTAL KNEE ARTHROPLASTY- left (Left) Patient reports pain as mild.  No n/v.  Objective: Vital signs in last 24 hours: Temp:  [97.4 F (36.3 C)-98.9 F (37.2 C)] 98.2 F (36.8 C) (10/04 0529) Pulse Rate:  [55-79] 79 (10/04 0529) Resp:  [8-21] 18 (10/04 0529) BP: (101-134)/(57-81) 117/61 mmHg (10/04 0529) SpO2:  [91 %-100 %] 96 % (10/04 0529)  Intake/Output from previous day: 10/03 0701 - 10/04 0700 In: 1000 [I.V.:1000] Out: 1601 [Urine:1575; Stool:1; Blood:25] Intake/Output this shift: Total I/O In: 618.8 [I.V.:618.8] Out: -    Recent Labs  04/23/13 0557  HGB 9.7*    Recent Labs  04/23/13 0557  WBC 8.5  RBC 3.17*  HCT 29.0*  PLT 306    Recent Labs  04/23/13 0557  NA 138  K 4.0  CL 103  CO2 25  BUN 20  CREATININE 1.46*  GLUCOSE 252*  CALCIUM 8.0*    Recent Labs  04/23/13 0557  INR 1.11    PE:  L knee wound dressed and dry.  NVI at L LE.  Assessment/Plan: 1 Day Post-Op Procedure(s) (LRB): TOTAL KNEE ARTHROPLASTY- left (Left) Up with therapy  Continue wbat and cpm.  Dorianne Perret 04/23/2013, 9:44 AM

## 2013-04-23 NOTE — Progress Notes (Signed)
ANTICOAGULATION CONSULT NOTE - Initial Consult  Pharmacy Consult for warfarin Indication: VTE prophylaxis  Allergies  Allergen Reactions  . Lipitor [Atorvastatin] Other (See Comments)    Muscle pain    Patient Measurements:     Vital Signs: Temp: 98.2 F (36.8 C) (10/04 0529) BP: 117/61 mmHg (10/04 0529) Pulse Rate: 79 (10/04 0529)  Labs:  Recent Labs  04/23/13 0557  HGB 9.7*  HCT 29.0*  PLT 306  LABPROT 14.1  INR 1.11  CREATININE 1.46*    The CrCl is unknown because both a height and weight (above a minimum accepted value) are required for this calculation.   Medical History: Past Medical History  Diagnosis Date  . Diabetes mellitus   . Hypertension   . Arthritis   . Hyperlipidemia   . Sleep apnea     on cpap at home  . Cerebral artery disease   . 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 Hernia, umbilical   . Depression     Medications:  Scheduled:  . carvedilol  12.5 mg Oral BID WC  . celecoxib  200 mg Oral Q12H  . clopidogrel  75 mg Oral Q breakfast  . coumadin book   Does not apply Once  . escitalopram  20 mg Oral Daily  . fenofibrate  160 mg Oral Daily  . ferrous sulfate  325 mg Oral TID PC  . glyBURIDE  10 mg Oral BID WC   And  . metFORMIN  1,000 mg Oral BID WC  . losartan  100 mg Oral Daily   And  . hydrochlorothiazide  25 mg Oral Daily  . Warfarin - Pharmacist Dosing Inpatient   Does not apply q1800    Assessment: Patrick Miranda s/p left TKA started warfarin for VTE prophylaxis post-op. Not on anticoagulants PTA. After one 5mg  dose of warfarin, patient's INR is subtherapeutic at 1.11. H/H has trended down since 9/30. Platelets are normal. No S/S of bleeding.    Goal of Therapy:  INR 2-3 Monitor platelets by anticoagulation protocol: Yes   Plan:  - Warfarin 5mg  PO x 1 tonight - Daily PT/INR - Follow CBC and for any signs/symptoms of bleeding or clotting  Christiane Ha A. Lenon Ahmadi, PharmD Clinical Pharmacist - Resident Pager: [phone] Pharmacy:  [phone] 04/23/2013 9:31 AM

## 2013-04-23 NOTE — Evaluation (Signed)
Occupational Therapy Evaluation Patient Details Name: Patrick Miranda MRN: [id number] DOB: 06-May-1946 Today's Date: 04/23/2013 Time: 1344-1400 OT Time Calculation (min): 16 min  OT Assessment / Plan / Recommendation History of present illness Patient is a 67 yo male s/p Lt TKA   Clinical Impression   Pt admitted with above. Will continue to follow pt acutely in order to address below problem list in prep for return home with wife.    OT Assessment  Patient needs continued OT Services    Follow Up Recommendations  No OT follow up;Supervision/Assistance - 24 hour    Barriers to Discharge      Equipment Recommendations  None recommended by OT    Recommendations for Other Services    Frequency  Min 2X/week    Precautions / Restrictions Precautions Precautions: Knee Required Braces or Orthoses: Knee Immobilizer - Left Knee Immobilizer - Left: On except when in CPM Restrictions Weight Bearing Restrictions: Yes LLE Weight Bearing: Weight bearing as tolerated   Pertinent Vitals/Pain See vitals    ADL  Grooming: Performed;Min guard;Wash/dry hands Where Assessed - Grooming: Unsupported standing Upper Body Bathing: Simulated;Set up Where Assessed - Upper Body Bathing: Unsupported sitting Lower Body Bathing: Simulated;Minimal assistance Where Assessed - Lower Body Bathing: Supported sit to stand Upper Body Dressing: Simulated;Set up Where Assessed - Upper Body Dressing: Unsupported sitting Lower Body Dressing: Performed;Moderate assistance Where Assessed - Lower Body Dressing: Supported sit to Pharmacist, hospital: Performed;Minimal Dentist Method: Sit to Barista: Raised toilet seat with arms (or 3-in-1 over toilet) Toileting - Clothing Manipulation and Hygiene: Performed;Minimal assistance Where Assessed - Engineer, mining and Hygiene: Sit to stand from 3-in-1 or toilet Equipment Used: Rolling walker;Knee  Immobilizer;Gait belt Transfers/Ambulation Related to ADLs: Min guard with RW.VCs for technique and gait sequencing. ADL Comments: Pt in bathroom upon OT arrival.  Pt requiring assist for sit<>stand from 3n1 over toilet and for steadying while transitioning UEs from armrests to RW.  Assist also for balance while pulling undergarment up over hips.   Educated pt and wife on use of 3n1 as a shower chair. Pt states he thinks his walk in shower is too small for 3n1 but will try it. Otherwise may sponge bath initially when he returns home.    OT Diagnosis: Generalized weakness;Acute pain  OT Problem List: Decreased strength;Decreased activity tolerance;Impaired balance (sitting and/or standing);Decreased knowledge of use of DME or AE;Pain OT Treatment Interventions: Self-care/ADL training;DME and/or AE instruction;Therapeutic activities;Patient/family education;Balance training   OT Goals(Current goals can be found in the care plan section) Acute Rehab OT Goals Patient Stated Goal: to go home OT Goal Formulation: With patient Time For Goal Achievement: 04/30/13 Potential to Achieve Goals: Good  Visit Information  Last OT Received On: 04/23/13 Assistance Needed: +1 History of Present Illness: Patient is a 67 yo male s/p Lt TKA       Prior Functioning     Home Living Family/patient expects to be discharged to:: Private residence Living Arrangements: Spouse/significant other;Children Available Help at Discharge: Family;Available 24 hours/day Type of Home: House Home Access: Stairs to enter [employer] of Steps: 1 Entrance Stairs-Rails: None Home Layout: One level Home Equipment: Walker - 2 wheels;Bedside commode Prior Function Level of Independence: Independent         Vision/Perception     Cognition  Cognition Arousal/Alertness: Awake/alert Behavior During Therapy: WFL for tasks assessed/performed Overall Cognitive Status: Within Functional Limits for tasks  assessed    Extremity/Trunk Assessment Upper Extremity  Assessment Upper Extremity Assessment: Overall WFL for tasks assessed     Mobility Bed Mobility Bed Mobility: Not assessed Transfers Transfers: Sit to Stand;Stand to Sit Sit to Stand: 4: Min assist;From chair/3-in-1;With upper extremity assist;With armrests Stand to Sit: 4: Min guard;To chair/3-in-1;With armrests;With upper extremity assist Details for Transfer Assistance: VCs for hand placement and technique.     Exercise     Balance     End of Session OT - End of Session Equipment Utilized During Treatment: Gait belt;Rolling walker Activity Tolerance: Patient tolerated treatment well Patient left: in chair;with call bell/phone within reach;with family/visitor present Nurse Communication: Mobility status  GO   04/23/2013 Cipriano Mile OTR/L Pager [phone] Office [phone]   Cipriano Mile 04/23/2013, 3:45 PM

## 2013-04-23 NOTE — Progress Notes (Signed)
Physical Therapy Treatment Patient Details Name: Patrick Miranda MRN: [id number] DOB: 13-May-1946 Today's Date: 04/23/2013 Time: 1420-1500 PT Time Calculation (min): 40 min  PT Assessment / Plan / Recommendation  History of Present Illness Patient is a 67 yo male s/p Lt TKA   PT Comments   Decreased knee buckling with gait with pm session. Will plan for stair education tomorrow.  Follow Up Recommendations  Home health PT;Supervision/Assistance - 24 hour           Equipment Recommendations  None recommended by PT       Frequency 7X/week   Progress towards PT Goals Progress towards PT goals: Progressing toward goals  Plan Current plan remains appropriate    Precautions / Restrictions Precautions Precautions: Knee Required Braces or Orthoses: Knee Immobilizer - Left Knee Immobilizer - Left: On except when in CPM Restrictions Weight Bearing Restrictions: Yes LLE Weight Bearing: Weight bearing as tolerated       Mobility  Bed Mobility Bed Mobility: Sit to Supine Sit to Supine: 4: Min guard;HOB flat Details for Bed Mobility Assistance: cues on transition technique, no physical assist needed Transfers Sit to Stand: 4: Min assist;From chair/3-in-1;With upper extremity assist;With armrests Stand to Sit: 4: Min guard;To bed;With upper extremity assist Details for Transfer Assistance: VCs for hand placement and technique. Ambulation/Gait Ambulation/Gait Assistance: 4: Min assist;4: Min guard;5: Supervision Ambulation Distance (Feet): 100 Feet Assistive device: Rolling walker Ambulation/Gait Assistance Details: progressed from min guard assist to supervision with gait. min cues for posture, walker position with gait and sequence. Less knee buckling with KI on noted with pm session. Gait Pattern: Step-through pattern;Decreased stride length;Antalgic Gait velocity: decreased    Exercises Total Joint Exercises Ankle Circles/Pumps: AROM;Both;10 reps;Supine Quad Sets:  AROM;Left;10 reps;Supine Short Arc Quad: AAROM;Strengthening;Left;10 reps;Supine Heel Slides: AAROM;Strengthening;Left;10 reps;Supine Hip ABduction/ADduction: AAROM;Strengthening;10 reps;Supine Straight Leg Raises: AAROM;Strengthening;Left;10 reps;Supine     PT Goals (current goals can now be found in the care plan section) Acute Rehab PT Goals Patient Stated Goal: to go home PT Goal Formulation: With patient/family Time For Goal Achievement: 04/29/13 Potential to Achieve Goals: Good  Visit Information  Last PT Received On: 04/23/13 Assistance Needed: +1 History of Present Illness: Patient is a 67 yo male s/p Lt TKA    Subjective Data  Patient Stated Goal: to go home   Cognition  Cognition Arousal/Alertness: Awake/alert Behavior During Therapy: WFL for tasks assessed/performed Overall Cognitive Status: Within Functional Limits for tasks assessed       End of Session PT - End of Session Equipment Utilized During Treatment: Gait belt;Left knee immobilizer Activity Tolerance: Patient tolerated treatment well Patient left: in bed;in CPM;with call bell/phone within reach;with family/visitor present Nurse Communication: Mobility status CPM Left Knee CPM Left Knee: On Left Knee Flexion (Degrees): 88 Left Knee Extension (Degrees): 0 Additional Comments: cpm applied at 1500   GP     Sallyanne Kuster 04/23/2013, 4:07 PM

## 2013-04-23 NOTE — Progress Notes (Signed)
Patient discharged home. Left with significant other. Discharge teaching completed. Patient verbalized understanding.

## 2013-04-23 NOTE — Progress Notes (Signed)
Patient setup on CPAP for QHS at 8 cmH20 and no O2 bleed in. Patient places himself on with home mask being used. Wife wants to assist. Will continue to monitor.

## 2013-04-23 NOTE — Progress Notes (Signed)
Clinical Child psychotherapist (CSW) received referral for SNF. Per chart review PT is recommending home health. Please reconsult if further social work needs arise. CSW signing off.   Jetta Lout, LCSWA Weekend CSW [phone]

## 2013-04-23 NOTE — Progress Notes (Signed)
RT Note: Pt stated he can place himself on CPAP when ready. H2o Filled. RT will continue to monitor

## 2013-04-23 NOTE — Progress Notes (Signed)
Physical Therapy Treatment Patient Details Name: Patrick Miranda MRN: [id number] DOB: 03-19-1946 Today's Date: 04/23/2013 Time: 0935-1008 PT Time Calculation (min): 33 min  PT Assessment / Plan / Recommendation  History of Present Illness Patient is a 67 yo male s/p Lt TKA   PT Comments   Pt making steady progress toward goals this session. Demo's decreased quad activation and control with exercises and gait.   Follow Up Recommendations  Home health PT;Supervision/Assistance - 24 hour     Equipment Recommendations  None recommended by PT       Frequency 7X/week   Progress towards PT Goals Progress towards PT goals: Progressing toward goals  Plan Current plan remains appropriate    Precautions / Restrictions Precautions Precautions: Knee Required Braces or Orthoses: Knee Immobilizer - Left Knee Immobilizer - Left: On except when in CPM Restrictions LLE Weight Bearing: Weight bearing as tolerated       Mobility  Bed Mobility Supine to Sit: 4: Min assist;HOB flat;With rails Sitting - Scoot to Edge of Bed: 5: Supervision Details for Bed Mobility Assistance: Cues on technique to transition to edge of bed. assist needed to complete trunk transition into sitting at edge of bed. pt able to move both legs off bed without assistance. Transfers Sit to Stand: 4: Min assist;From bed;With upper extremity assist Stand to Sit: 4: Min guard;To chair/3-in-1;With armrests;With upper extremity assist Details for Transfer Assistance: cues for hand placement and weight shifting to stand. cues for hand placement to control descent with sitting down. min assist for balance with initial stand due to posterior loss of balance. Ambulation/Gait Ambulation/Gait Assistance: 4: Min assist Ambulation Distance (Feet): 130 Feet Assistive device: Rolling walker Ambulation/Gait Assistance Details: mod cues for posture (stand up and relax shoulders),  walker position and sequence with gait. left knee  buckling multiple times despite KI with gait needing up to min assist to correct. Gait Pattern: Step-to pattern;Decreased step length - right;Decreased stance time - left;Trunk flexed;Antalgic Gait velocity: decreased    Exercises Total Joint Exercises Ankle Circles/Pumps: AROM;Both;10 reps;Supine Quad Sets: AROM;Strengthening;Left;10 reps;Supine Short Arc Quad: AAROM;Strengthening;Left;10 reps;Supine;Limitations Short Arc Quad Limitations: decr quad activation and control with exercise Heel Slides: AAROM;Strengthening;Left;10 reps;Supine Hip ABduction/ADduction: AAROM;Strengthening;Left;10 reps;Supine Straight Leg Raises: AAROM;Strengthening;Left;10 reps;Supine;Limitations Straight Leg Raises Limitations: decr quad activation with >10 degree extension lag without assist to correct Goniometric ROM: supine left knee AROM: 90 degrees flexion     PT Goals (current goals can now be found in the care plan section) Acute Rehab PT Goals Patient Stated Goal: To walk.  To go home PT Goal Formulation: With patient/family Time For Goal Achievement: 04/29/13 Potential to Achieve Goals: Good  Visit Information  Last PT Received On: 04/23/13 Assistance Needed: +1 History of Present Illness: Patient is a 67 yo male s/p Lt TKA    Subjective Data  Patient Stated Goal: To walk.  To go home   Cognition  Cognition Arousal/Alertness: Awake/alert Behavior During Therapy: WFL for tasks assessed/performed Overall Cognitive Status: Within Functional Limits for tasks assessed       End of Session PT - End of Session Equipment Utilized During Treatment: Gait belt;Left knee immobilizer Activity Tolerance: Patient tolerated treatment well Patient left: in chair;with family/visitor present;with call bell/phone within reach Nurse Communication: Mobility status   GP     Sallyanne Kuster 04/23/2013, 10:16 AM  Sallyanne Kuster, PTA Office- [phone]

## 2013-04-24 LAB — CBC
Hemoglobin: 9.3 g/dL — ABNORMAL LOW (ref 13.0–17.0)
MCH: 31.1 pg (ref 26.0–34.0)
MCV: 91.6 fL (ref 78.0–100.0)
Platelets: 261 10*3/uL (ref 150–400)
RBC: 2.99 MIL/uL — ABNORMAL LOW (ref 4.22–5.81)
RDW: 12.7 % (ref 11.5–15.5)
WBC: 9.2 10*3/uL (ref 4.0–10.5)

## 2013-04-24 LAB — GLUCOSE, CAPILLARY
Glucose-Capillary: 286 mg/dL — ABNORMAL HIGH (ref 70–99)
Glucose-Capillary: 226 mg/dL — ABNORMAL HIGH (ref 70–99)

## 2013-04-24 MED ORDER — WARFARIN SODIUM 7.5 MG PO TABS
7.5000 mg | ORAL_TABLET | Freq: Once | ORAL | Status: AC
  Administered 2013-04-24: 7.5 mg via ORAL
  Filled 2013-04-24: qty 1

## 2013-04-24 NOTE — Progress Notes (Signed)
Orthopedics Progress Note  Subjective: I feel a little dizzy when I get up. Otherwise I feel fine.  Objective:  Filed Vitals:   04/24/13 0621  BP: 142/66  Pulse: 84  Temp: 98.4 F (36.9 C)  Resp: 18    General: Awake and alert  Musculoskeletal: wound looks great today, dressing changed, minimal swelling Neurovascularly intact  Lab Results  Component Value Date   WBC 9.2 04/24/2013   HGB 9.3* 04/24/2013   HCT 27.4* 04/24/2013   MCV 91.6 04/24/2013   PLT 261 04/24/2013       Component Value Date/Time   NA 138 04/23/2013 0557   K 4.0 04/23/2013 0557   CL 103 04/23/2013 0557   CO2 25 04/23/2013 0557   GLUCOSE 252* 04/23/2013 0557   BUN 20 04/23/2013 0557   CREATININE 1.46* 04/23/2013 0557   CALCIUM 8.0* 04/23/2013 0557   GFRNONAA 48* 04/23/2013 0557   GFRAA 55* 04/23/2013 0557    Lab Results  Component Value Date   INR 1.44 04/24/2013   INR 1.11 04/23/2013   INR 0.91 04/19/2013    Assessment/Plan: POD #2 s/p Procedure(s): TOTAL KNEE ARTHROPLASTY- left Acute Blood Loss Anemia - slightly symptomatic today ..will watch closely PT,OT mobilization DVT prophylaxis - mechanical and coumadin  INR only 1.44 so will add Lovenox Possible d/c tomorrow  CPM for home, already at 90 degrees  Almedia Balls. Ranell Patrick, MD 04/24/2013 11:09 AM

## 2013-04-24 NOTE — Progress Notes (Signed)
ANTICOAGULATION CONSULT NOTE - Follow-Up Consult  Pharmacy Consult for warfarin Indication: VTE prophylaxis  Allergies  Allergen Reactions  . Lipitor [Atorvastatin] Other (See Comments)    Muscle pain    Patient Measurements:     Vital Signs: Temp: 98.4 F (36.9 C) (10/05 0621) Temp src: Oral (10/05 0621) BP: 142/66 mmHg (10/05 0621) Pulse Rate: 84 (10/05 0621)  Labs:  Recent Labs  04/23/13 0557 04/24/13 0712  HGB 9.7* 9.3*  HCT 29.0* 27.4*  PLT 306 261  LABPROT 14.1 17.2*  INR 1.11 1.44  CREATININE 1.46*  --     The CrCl is unknown because both a height and weight (above a minimum accepted value) are required for this calculation.   Medical History: Past Medical History  Diagnosis Date  . Diabetes mellitus   . Hypertension   . Arthritis   . Hyperlipidemia   . Sleep apnea     on cpap at home  . Cerebral artery disease   . 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 Hernia, umbilical   . Depression     Medications:  Scheduled:  . carvedilol  12.5 mg Oral BID WC  . celecoxib  200 mg Oral Q12H  . clopidogrel  75 mg Oral Q breakfast  . coumadin book   Does not apply Once  . escitalopram  20 mg Oral Daily  . fenofibrate  160 mg Oral Daily  . ferrous sulfate  325 mg Oral TID PC  . glyBURIDE  10 mg Oral BID WC   And  . metFORMIN  1,000 mg Oral BID WC  . losartan  100 mg Oral Daily   And  . hydrochlorothiazide  25 mg Oral Daily  . Warfarin - Pharmacist Dosing Inpatient   Does not apply q1800    Assessment: [name] s/p left TKA started warfarin for VTE prophylaxis post-op. Not on anticoagulants PTA. After two 5mg  doses of warfarin, patient's INR is subtherapeutic at 1.44.  H/H has trended down since 9/30. Platelets are normal. No S/S of bleeding. Patient is on celebrex which can increase risk for bleeding.   Goal of Therapy:  INR 2-3 Monitor platelets by anticoagulation protocol: Yes   Plan:  - Warfarin 7.5mg  PO x 1 tonight - Daily PT/INR - Follow CBC  and for any signs/symptoms of bleeding or clotting  Christiane Ha A. Lenon Ahmadi, PharmD Clinical Pharmacist - Resident Pager: [phone] Pharmacy: [phone] 04/24/2013 9:10 AM

## 2013-04-24 NOTE — Progress Notes (Signed)
Physical Therapy Treatment Patient Details Name: Patrick Miranda MRN: [id number] DOB: 02-10-1946 Today's Date: 04/24/2013 Time: 2952-8413 PT Time Calculation (min): 25 min  PT Assessment / Plan / Recommendation  History of Present Illness Patient is a 67 yo male s/p Lt TKA   PT Comments   Progressing well. Needs to practice single step forward with RW to better simulate and prepare for home discharge tomorrow.   Follow Up Recommendations  Home health PT;Supervision/Assistance - 24 hour     Equipment Recommendations  None recommended by PT       Frequency 7X/week   Progress towards PT Goals Progress towards PT goals: Progressing toward goals  Plan Current plan remains appropriate    Precautions / Restrictions Precautions Precautions: Knee Restrictions LLE Weight Bearing: Weight bearing as tolerated       Mobility  Bed Mobility Supine to Sit: 6: Modified independent (Device/Increase time);HOB flat Details for Bed Mobility Assistance: needs incr time, no cues or physical assist needed Transfers Sit to Stand: 6: Modified independent (Device/Increase time);From bed;With upper extremity assist Stand to Sit: 6: Modified independent (Device/Increase time);To chair/3-in-1;With upper extremity assist;With armrests Details for Transfer Assistance: no cues or assist needed Ambulation/Gait Ambulation/Gait Assistance: 5: Supervision Ambulation Distance (Feet): 250 Feet Assistive device: Rolling walker Ambulation/Gait Assistance Details: cues for walker position with gait and for technique to progress from step to pattern to a step through pattern with gait. pt able to demo step through for most part, reverts back to step to pattern easiy and can switch to step through with cues Gait Pattern: Step-to pattern;Step-through pattern;Antalgic Stairs: Yes Stairs Assistance: 4: Min assist Stairs Assistance Details (indicate cue type and reason): pt/spouse reported they have 2 stairs at  home, when entering the stairwell to work on stairs they further clarified that they have one step-porch space and then a second single step. Practiced going up one step backwards with RW.  Needs to practice single step forward with RW to better simulate home setup.      Stair Management Technique: With walker;Backwards;Step to pattern Number of Stairs: 1 Wheelchair Mobility Wheelchair Mobility: No    Exercises Total Joint Exercises Ankle Circles/Pumps: AROM;Both;10 reps;Supine Quad Sets: AROM;Strengthening;Left;10 reps;Supine Heel Slides: AROM;Strengthening;Left;10 reps;Supine Straight Leg Raises: AAROM;Strengthening;Left;10 reps;Supine Long Arc Quad: AROM;Strengthening;Left;10 reps;Seated Knee Flexion: AROM;Strengthening;Left;10 reps;Seated     PT Goals (current goals can now be found in the care plan section) Acute Rehab PT Goals Patient Stated Goal: to go home PT Goal Formulation: With patient/family Time For Goal Achievement: 04/29/13 Potential to Achieve Goals: Good  Visit Information  Last PT Received On: 04/24/13 Assistance Needed: +1 History of Present Illness: Patient is a 67 yo male s/p Lt TKA    Subjective Data  Patient Stated Goal: to go home   Cognition  Cognition Arousal/Alertness: Awake/alert Behavior During Therapy: WFL for tasks assessed/performed Overall Cognitive Status: Within Functional Limits for tasks assessed       End of Session PT - End of Session Equipment Utilized During Treatment: Gait belt Activity Tolerance: Patient tolerated treatment well Patient left: in chair;with call bell/phone within reach;with family/visitor present Nurse Communication: Mobility status CPM Left Knee CPM Left Knee: Off Left Knee Flexion (Degrees): 90 Left Knee Extension (Degrees): 0   GP     Sallyanne Kuster 04/24/2013, 3:55 PM  Sallyanne Kuster, PTA Office- [phone]

## 2013-04-24 NOTE — Progress Notes (Signed)
Physical Therapy Treatment Patient Details Name: Patrick Miranda MRN: [id number] DOB: [birth year] Today's Date: 04/24/2013 Time: 4098-1191 PT Time Calculation (min): 26 min  PT Assessment / Plan / Recommendation  History of Present Illness Patient is a 67 yo male s/p Lt TKA   PT Comments   Pt making steady progress with mobility. Increased quad activation and control today with no buckling with gait without KI.   Follow Up Recommendations  Home health PT;Supervision/Assistance - 24 hour     Equipment Recommendations  None recommended by PT    Frequency 7X/week   Progress towards PT Goals Progress towards PT goals: Progressing toward goals  Plan Current plan remains appropriate    Precautions / Restrictions Precautions Precautions: Knee Required Braces or Orthoses: Knee Immobilizer - Left Knee Immobilizer - Left: Other (comment) (at night only per new MD orders) Restrictions LLE Weight Bearing: Weight bearing as tolerated       Mobility  Bed Mobility Supine to Sit: 5: Supervision;HOB flat Sitting - Scoot to Edge of Bed: 5: Supervision Details for Bed Mobility Assistance: cues for technique. No physical assist needed with sitting up to edge of bed. Transfers Sit to Stand: 4: Min guard;From bed;With upper extremity assist Stand to Sit: 4: Min guard;To chair/3-in-1;With upper extremity assist;With armrests Details for Transfer Assistance: no cues needed with transfers today, incr time needed with standing Ambulation/Gait Ambulation/Gait Assistance: 4: Min guard Ambulation Distance (Feet): 200 Feet Assistive device: Rolling walker Ambulation/Gait Assistance Details: cues for posture and walker position. with cues pt able to progress from step to pattern to step through gait pattern Gait Pattern: Step-to pattern;Step-through pattern;Decreased stride length;Antalgic Gait velocity: decreased,  improved at gait pattern progressed to step through pattern    Exercises Total  Joint Exercises Ankle Circles/Pumps: AROM;Both;10 reps;Supine Quad Sets: AROM;Strengthening;Left;10 reps;Supine Heel Slides: AROM;Strengthening;Left;10 reps;Supine Hip ABduction/ADduction: AROM;Strengthening;Left;5 reps;Supine Straight Leg Raises: AAROM;Strengthening;Left;10 reps;Supine Long Arc Quad: AROM;Strengthening;Left;10 reps;Seated Knee Flexion: AROM;Strengthening;Seated;Left;10 reps Goniometric ROM: supine left knee AROM: 92 degrees flexion and -4 degrees from neutral     PT Goals (current goals can now be found in the care plan section) Acute Rehab PT Goals Patient Stated Goal: to go home PT Goal Formulation: With patient/family Time For Goal Achievement: 04/29/13 Potential to Achieve Goals: Good  Visit Information  Last PT Received On: 04/24/13 Assistance Needed: +1 History of Present Illness: Patient is a 67 yo male s/p Lt TKA    Subjective Data  Patient Stated Goal: to go home   Cognition  Cognition Arousal/Alertness: Awake/alert Behavior During Therapy: WFL for tasks assessed/performed Overall Cognitive Status: Within Functional Limits for tasks assessed       End of Session PT - End of Session Equipment Utilized During Treatment: Gait belt Activity Tolerance: Patient tolerated treatment well Patient left: in chair;with family/visitor present;with call bell/phone within reach Nurse Communication: Mobility status CPM Left Knee CPM Left Knee: Off Left Knee Flexion (Degrees): 90 Left Knee Extension (Degrees): 0 Additional Comments: off at 1109   GP     Sallyanne Kuster 04/24/2013, 11:43 AM  Sallyanne Kuster, PTA Office- [phone]

## 2013-04-24 NOTE — Progress Notes (Signed)
RT Note: Pt stated he can place himself on CPAP when he is ready. I told him if he needs further assistance to call RT. Will continue to monitor.

## 2013-04-25 ENCOUNTER — Telehealth: Payer: Self-pay | Admitting: Internal Medicine

## 2013-04-25 DIAGNOSIS — G4733 Obstructive sleep apnea (adult) (pediatric): Secondary | ICD-10-CM

## 2013-04-25 LAB — CBC
HCT: 23.7 % — ABNORMAL LOW (ref 39.0–52.0)
Hemoglobin: 8.1 g/dL — ABNORMAL LOW (ref 13.0–17.0)
MCH: 31.2 pg (ref 26.0–34.0)
MCHC: 34.2 g/dL (ref 30.0–36.0)
Platelets: 247 10*3/uL (ref 150–400)
RBC: 2.6 MIL/uL — ABNORMAL LOW (ref 4.22–5.81)

## 2013-04-25 LAB — GLUCOSE, CAPILLARY
Glucose-Capillary: 189 mg/dL — ABNORMAL HIGH (ref 70–99)
Glucose-Capillary: 232 mg/dL — ABNORMAL HIGH (ref 70–99)
Glucose-Capillary: 289 mg/dL — ABNORMAL HIGH (ref 70–99)

## 2013-04-25 LAB — PROTIME-INR
INR: 1.77 — ABNORMAL HIGH (ref 0.00–1.49)
Prothrombin Time: 20.1 seconds — ABNORMAL HIGH (ref 11.6–15.2)

## 2013-04-25 MED ORDER — METHOCARBAMOL 500 MG PO TABS: 500.0000 mg | ORAL_TABLET | Freq: Four times a day (QID) | ORAL | Status: DC | PRN

## 2013-04-25 MED ORDER — WARFARIN SODIUM 7.5 MG PO TABS
7.5000 mg | ORAL_TABLET | Freq: Once | ORAL | Status: DC
  Filled 2013-04-25: qty 1

## 2013-04-25 MED ORDER — FERROUS SULFATE 325 (65 FE) MG PO TABS: 325.0000 mg | ORAL_TABLET | Freq: Three times a day (TID) | ORAL | Status: DC

## 2013-04-25 MED ORDER — INSULIN ASPART 100 UNIT/ML ~~LOC~~ SOLN
0.0000 [IU] | Freq: Three times a day (TID) | SUBCUTANEOUS | Status: DC
Start: 2013-04-25 — End: 2013-04-25
  Administered 2013-04-25: 5 [IU] via SUBCUTANEOUS

## 2013-04-25 NOTE — Progress Notes (Signed)
   Subjective: 3 Days Post-Op Procedure(s) (LRB): TOTAL KNEE ARTHROPLASTY- left (Left)  C/o mild pain  Wife concerned with low blood counts but pt denies any dizziness or symptoms of hypovolemia Patient reports pain as mild.  Objective:   VITALS:   Filed Vitals:   04/25/13 0800  BP:   Pulse:   Temp:   Resp: 16    Left knee incision healing well nv intact distally No rashes or edema  LABS  Recent Labs  04/23/13 0557 04/24/13 0712 04/25/13 0550  HGB 9.7* 9.3* 8.1*  HCT 29.0* 27.4* 23.7*  WBC 8.5 9.2 8.7  PLT 306 261 247     Recent Labs  04/23/13 0557  NA 138  K 4.0  BUN 20  CREATININE 1.46*  GLUCOSE 252*     Assessment/Plan: 3 Days Post-Op Procedure(s) (LRB): TOTAL KNEE ARTHROPLASTY- left (Left) Acute blood loss anemia - currently asymptomatic Plan for d/c today Discussed hyperglycemia with Dr. Jacky Kindle and will train on insulin therapy in case needed at home Home PT/OT F/u in 2 weeks with Dr. Ranell Patrick Pain control as needed    Alphonsa Overall, MPAS, PA-C  04/25/2013, 10:53 AM

## 2013-04-25 NOTE — Progress Notes (Signed)
Met with patient regarding home health services, discussed [place] home health agency arranged by MD office for RN, PT.  Patient states has the DME: RW, 3:1 and CPM at home.

## 2013-04-25 NOTE — Discharge Summary (Signed)
Physician Discharge Summary   Patient ID: Patrick Miranda MRN: [id number] DOB/AGE: 1946-01-27 67 y.o.  Admit date: 04/22/2013 Discharge date: 04/25/2013  Admission Diagnoses:  Left knee end stage osteoarthritis  Discharge Diagnoses:  Same plus acute blood loss anemia asymptomatic   Surgeries: Procedure(s): TOTAL KNEE ARTHROPLASTY- left on 04/22/2013   Consultants: PT/OT  Discharged Condition: Stable  Hospital Course: Patrick Miranda is an 67 y.o. male who was admitted 04/22/2013 with a chief complaint of No chief complaint on file. , and found to have a diagnosis of <principal problem not specified>.  They were brought to the operating room on 04/22/2013 and underwent the above named procedures.    The patient had an uncomplicated hospital course and was stable for discharge.  Recent vital signs:  Filed Vitals:   04/25/13 0800  BP:   Pulse:   Temp:   Resp: 16    Recent laboratory studies:  Results for orders placed during the hospital encounter of 04/22/13  GLUCOSE, CAPILLARY      Result Value Range   Glucose-Capillary 153 (*) 70 - 99 mg/dL   Comment 1 Notify RN    PROTIME-INR      Result Value Range   Prothrombin Time 14.1  11.6 - 15.2 seconds   INR 1.11  0.00 - 1.49  CBC      Result Value Range   WBC 8.5  4.0 - 10.5 K/uL   RBC 3.17 (*) 4.22 - 5.81 MIL/uL   Hemoglobin 9.7 (*) 13.0 - 17.0 g/dL   HCT 40.9 (*) 81.1 - 91.4 %   MCV 91.5  78.0 - 100.0 fL   MCH 30.6  26.0 - 34.0 pg   MCHC 33.4  30.0 - 36.0 g/dL   RDW 78.2  95.6 - 21.3 %   Platelets 306  150 - 400 K/uL  BASIC METABOLIC PANEL      Result Value Range   Sodium 138  135 - 145 mEq/L   Potassium 4.0  3.5 - 5.1 mEq/L   Chloride 103  96 - 112 mEq/L   CO2 25  19 - 32 mEq/L   Glucose, Bld 252 (*) 70 - 99 mg/dL   BUN 20  6 - 23 mg/dL   Creatinine, Ser 0.86 (*) 0.50 - 1.35 mg/dL   Calcium 8.0 (*) 8.4 - 10.5 mg/dL   GFR calc non Af Amer 48 (*) >90 mL/min   GFR calc Af Amer 55 (*) >90 mL/min  GLUCOSE,  CAPILLARY      Result Value Range   Glucose-Capillary 250 (*) 70 - 99 mg/dL  GLUCOSE, CAPILLARY      Result Value Range   Glucose-Capillary 221 (*) 70 - 99 mg/dL  GLUCOSE, CAPILLARY      Result Value Range   Glucose-Capillary 254 (*) 70 - 99 mg/dL   Comment 1 Notify RN     Comment 2 Documented in Chart    PROTIME-INR      Result Value Range   Prothrombin Time 17.2 (*) 11.6 - 15.2 seconds   INR 1.44  0.00 - 1.49  CBC      Result Value Range   WBC 9.2  4.0 - 10.5 K/uL   RBC 2.99 (*) 4.22 - 5.81 MIL/uL   Hemoglobin 9.3 (*) 13.0 - 17.0 g/dL   HCT 57.8 (*) 46.9 - 62.9 %   MCV 91.6  78.0 - 100.0 fL   MCH 31.1  26.0 - 34.0 pg   MCHC 33.9  30.0 -  36.0 g/dL   RDW 30.8  65.7 - 84.6 %   Platelets 261  150 - 400 K/uL  GLUCOSE, CAPILLARY      Result Value Range   Glucose-Capillary 261 (*) 70 - 99 mg/dL  GLUCOSE, CAPILLARY      Result Value Range   Glucose-Capillary 218 (*) 70 - 99 mg/dL  GLUCOSE, CAPILLARY      Result Value Range   Glucose-Capillary 286 (*) 70 - 99 mg/dL   Comment 1 Documented in Chart     Comment 2 Notify RN    GLUCOSE, CAPILLARY      Result Value Range   Glucose-Capillary 259 (*) 70 - 99 mg/dL   Comment 1 Documented in Chart     Comment 2 Notify RN    PROTIME-INR      Result Value Range   Prothrombin Time 20.1 (*) 11.6 - 15.2 seconds   INR 1.77 (*) 0.00 - 1.49  CBC      Result Value Range   WBC 8.7  4.0 - 10.5 K/uL   RBC 2.60 (*) 4.22 - 5.81 MIL/uL   Hemoglobin 8.1 (*) 13.0 - 17.0 g/dL   HCT 96.2 (*) 95.2 - 84.1 %   MCV 91.2  78.0 - 100.0 fL   MCH 31.2  26.0 - 34.0 pg   MCHC 34.2  30.0 - 36.0 g/dL   RDW 32.4  40.1 - 02.7 %   Platelets 247  150 - 400 K/uL  GLUCOSE, CAPILLARY      Result Value Range   Glucose-Capillary 226 (*) 70 - 99 mg/dL  GLUCOSE, CAPILLARY      Result Value Range   Glucose-Capillary 189 (*) 70 - 99 mg/dL  GLUCOSE, CAPILLARY      Result Value Range   Glucose-Capillary 232 (*) 70 - 99 mg/dL    Discharge Medications:       Medication List         carvedilol 12.5 MG tablet  Commonly known as:  COREG  Take 12.5 mg by mouth 2 (two) times daily with a meal.     clopidogrel 75 MG tablet  Commonly known as:  PLAVIX  Take 75 mg by mouth daily with breakfast.     escitalopram 10 MG tablet  Commonly known as:  LEXAPRO  Take 20 mg by mouth daily.     fenofibrate 160 MG tablet  Take 160 mg by mouth daily.     ferrous sulfate 325 (65 FE) MG tablet  Take 1 tablet (325 mg total) by mouth 3 (three) times daily after meals.     glyBURIDE-metformin 5-500 MG per tablet  Commonly known as:  GLUCOVANCE  Take 2 tablets by mouth 2 (two) times daily with a meal.     losartan-hydrochlorothiazide 100-25 MG per tablet  Commonly known as:  HYZAAR  Take 1 tablet by mouth at bedtime.     methocarbamol 500 MG tablet  Commonly known as:  ROBAXIN  Take 1 tablet (500 mg total) by mouth 3 (three) times daily as needed.     methocarbamol 500 MG tablet  Commonly known as:  ROBAXIN  Take 1 tablet (500 mg total) by mouth every 6 (six) hours as needed.     oxyCODONE-acetaminophen 5-325 MG per tablet  Commonly known as:  ROXICET  Take 1-2 tablets by mouth every 4 (four) hours as needed for pain.     warfarin 5 MG tablet  Commonly known as:  COUMADIN  Take 1 tablet (5 mg  total) by mouth daily.        Diagnostic Studies: Dg Chest 2 View  04/19/2013   CLINICAL DATA:  Left knee arthroplasty. Diabetes. Hypertension.  EXAM: CHEST  2 VIEW  COMPARISON:  09/05/2011  FINDINGS: Midline trachea. Normal heart size and mediastinal contours. No pleural effusion or pneumothorax. Mild biapical pleural thickening. Clear lungs.  IMPRESSION: No acute cardiopulmonary disease.   Electronically Signed   By: Jeronimo Greaves   On: 04/19/2013 10:56   Dg Knee Left Port  04/22/2013   CLINICAL DATA:  Postop left knee replacement  EXAM: PORTABLE LEFT KNEE - 1-2 VIEW  COMPARISON:  Portable exam 1534 hr without priors for comparison  FINDINGS: Components  of left knee prosthesis identified in expected positions.  No acute fracture, dislocation or bone destruction.  No periprosthetic lucency or acute complication identified.  Expected postsurgical soft tissue changes at the knee and distal thigh.  IMPRESSION: Left knee prosthesis without acute complication.   Electronically Signed   By: Ulyses Southward M.D.   On: 04/22/2013 15:46    Disposition: 01-Home or Self Care      Discharge Orders   Future Appointments Provider Department Dept Phone   05/24/2013 2:45 PM Waymon Budge, MD [facility] [phone]   Future Orders Complete By Expires   Call MD / Call 911  As directed    Comments:     If you experience chest pain or shortness of breath, CALL 911 and be transported to the hospital emergency room.  If you develope a fever above 101 F, pus (white drainage) or increased drainage or redness at the wound, or calf pain, call your surgeon's office.   Constipation Prevention  As directed    Comments:     Drink plenty of fluids.  Prune juice may be helpful.  You may use a stool softener, such as Colace (over the counter) 100 mg twice a day.  Use MiraLax (over the counter) for constipation as needed.   Diet - low sodium heart healthy  As directed    Increase activity slowly as tolerated  As directed       Follow-up Information   Follow up with NORRIS,STEVEN R, MD. Call in 2 weeks. [phone])    Specialty:  Orthopedic Surgery   Contact information:   [address] [place] [address] [phone]        Signed: Thea Gist 04/25/2013, 10:59 AM

## 2013-04-25 NOTE — Telephone Encounter (Signed)
Pt spouse aware order will be sent to [facility]. Staff message also sent to [facility]

## 2013-04-25 NOTE — Progress Notes (Signed)
Physical Therapy Treatment Patient Details Name: Patrick Miranda MRN: [id number] DOB: 1945/11/18 Today's Date: 04/25/2013 Time: 4098-1191 PT Time Calculation (min): 39 min  PT Assessment / Plan / Recommendation  History of Present Illness Patient is a 67 yo male s/p Lt TKA   PT Comments   Pt moving well.  Safe to d/c home from PT standpoint.    Follow Up Recommendations  Home health PT;Supervision/Assistance - 24 hour     Does the patient have the potential to tolerate intense rehabilitation     Barriers to Discharge        Equipment Recommendations  None recommended by PT    Recommendations for Other Services    Frequency 7X/week   Progress towards PT Goals Progress towards PT goals: Progressing toward goals  Plan Current plan remains appropriate    Precautions / Restrictions Precautions Precautions: Knee Required Braces or Orthoses: Knee Immobilizer - Left Knee Immobilizer - Left:  (at night only per new MD orders) Restrictions Weight Bearing Restrictions: Yes LLE Weight Bearing: Weight bearing as tolerated       Mobility  Bed Mobility Bed Mobility: Supine to Sit;Sitting - Scoot to Edge of Bed Supine to Sit: 6: Modified independent (Device/Increase time);HOB flat Sitting - Scoot to Edge of Bed: 6: Modified independent (Device/Increase time) Transfers Transfers: Sit to Stand;Stand to Sit Sit to Stand: 6: Modified independent (Device/Increase time);With upper extremity assist;From bed Stand to Sit: 6: Modified independent (Device/Increase time);With upper extremity assist;With armrests;To chair/3-in-1 Details for Transfer Assistance: cues for technique Ambulation/Gait Ambulation/Gait Assistance: 5: Supervision Ambulation Distance (Feet): 300 Feet Assistive device: Rolling walker Ambulation/Gait Assistance Details: Cues for increased upright posture due to pt leaning fwds on RW.  Raised height of RW which helped with pt's posture.  Pt progressing with fluidity of  gait sequence Gait Pattern: Step-through pattern;Decreased stride length;Antalgic Stairs: Yes Stairs Assistance: 4: Min guard Stairs Assistance Details (indicate cue type and reason): cues for technique Stair Management Technique: No rails;Step to pattern;Forwards;With walker Number of Stairs: 1 (2x's) Wheelchair Mobility Wheelchair Mobility: No    Exercises Total Joint Exercises Ankle Circles/Pumps: AROM;Both;10 reps;Supine Quad Sets: AROM;Strengthening;Both;10 reps;Supine Heel Slides: AROM;Strengthening;Left;10 reps;Supine Straight Leg Raises: AROM;Strengthening;Left;10 reps;Supine Long Arc Quad: AROM;Strengthening;Left;10 reps;Seated Knee Flexion: AROM;Left;10 reps;Seated;AAROM Goniometric ROM: Lt knee flexion >90 degrees sitting EOB     PT Goals (current goals can now be found in the care plan section) Acute Rehab PT Goals Patient Stated Goal: to go home PT Goal Formulation: With patient/family Time For Goal Achievement: 04/29/13 Potential to Achieve Goals: Good  Visit Information  Last PT Received On: 04/25/13 Assistance Needed: +1 History of Present Illness: Patient is a 67 yo male s/p Lt TKA    Subjective Data  Patient Stated Goal: to go home   Cognition  Cognition Arousal/Alertness: Awake/alert Behavior During Therapy: WFL for tasks assessed/performed Overall Cognitive Status: Within Functional Limits for tasks assessed    Balance     End of Session PT - End of Session Equipment Utilized During Treatment: Gait belt Activity Tolerance: Patient tolerated treatment well Patient left: in chair;with call bell/phone within reach;with family/visitor present Nurse Communication: Mobility status   GP     Lara Mulch 04/25/2013, 1:16 PM  Verdell Face, PTA [phone] 04/25/2013

## 2013-04-25 NOTE — Progress Notes (Signed)
Patient provided with discharge instructions and follow up information. He is going home with HHPT set up with [place]. His wife and daughter are his support system.

## 2013-04-25 NOTE — Progress Notes (Signed)
Inpatient Diabetes Program Recommendations  AACE/ADA: New Consensus Statement on Inpatient Glycemic Control (2013)  Target Ranges:  Prepandial:   less than 140 mg/dL      Peak postprandial:   less than 180 mg/dL (1-2 hours)      Critically ill patients:  140 - 180 mg/dL   Reason for Visit: Results for TIERRE, NETTO (MRN [id number]) as of 04/25/2013 09:53  Ref. Range 04/24/2013 06:19 04/24/2013 11:17 04/24/2013 16:17 04/24/2013 22:31 04/25/2013 07:02  Glucose-Capillary Latest Range: 70-99 mg/dL 409 (H) 811 (H) 914 (H) 226 (H) 232 (H)   Note history of diabetes.  Patient is currently only on oral diabetes medications in the hospital.  Please add Novolog moderate tid with meals and HS scale.  Also please check A1C to determine pre-hospitalization glycemic control.  Thanks, Beryl Meager, RN, BC-ADM Inpatient Diabetes Coordinator Pager [phone]

## 2013-04-25 NOTE — Telephone Encounter (Signed)
Order- DME replacement CPAP 10, humidifier, mask of choice, supplies  Dx OSA

## 2013-04-25 NOTE — Progress Notes (Signed)
ANTICOAGULATION CONSULT NOTE - Follow-Up Consult  Pharmacy Consult for warfarin Indication: VTE prophylaxis  Allergies  Allergen Reactions  . Lipitor [Atorvastatin] Other (See Comments)    Muscle pain    Patient Measurements: Height: 5\' 10"  (177.8 cm) Weight: 180 lb (81.647 kg) IBW/kg (Calculated) : 73   Vital Signs: Temp: 98.3 F (36.8 C) (10/06 0600) Temp src: Oral (10/06 0600) BP: 92/56 mmHg (10/06 0600) Pulse Rate: 73 (10/06 0600)  Labs:  Recent Labs  04/23/13 0557 04/24/13 0712 04/25/13 0550  HGB 9.7* 9.3* 8.1*  HCT 29.0* 27.4* 23.7*  PLT 306 261 247  LABPROT 14.1 17.2* 20.1*  INR 1.11 1.44 1.77*  CREATININE 1.46*  --   --     Estimated Creatinine Clearance: 50 ml/min (by C-G formula based on Cr of 1.46).   Assessment: 65 YOM s/p left TKA started warfarin for VTE prophylaxis post-op. Not on anticoagulants PTA. INR up to 1.77 today after 3 doses of coumadin.   H/H down to 8.1/23.7. Platelets are normal. No S/S of bleeding. Patient is on celebrex which can increase risk for bleeding.   Goal of Therapy:  INR 2-3   Plan:  - repeat Warfarin 7.5mg  PO x 1 tonight - would rec DC home with prescription for 5 mg tablets and Rec 7.5 mg on MWF and 5 mg TTSS and have INR checked by home health. - Daily PT/INR - Follow CBC and for any signs/symptoms of bleeding or clotting Herby Abraham, Pharm.D. [phone] 04/25/2013 10:31 AM

## 2013-04-25 NOTE — Progress Notes (Addendum)
Occupational Therapy Treatment Patient Details Name: Chapin Arduini Iacovelli MRN: [id number] DOB: 1945/09/27 Today's Date: 04/25/2013 Time: 0833-0902 OT Time Calculation (min): 29 min  OT Assessment / Plan / Recommendation  History of present illness Patient is a 67 yo male s/p Lt TKA   OT comments  Practiced simulated shower transfer and provided education to pt and wife. Feel pt is safe to d/c home from OT standpoint with wife available to assist 24/7.  Follow Up Recommendations  No OT follow up;Supervision/Assistance - 24 hour    Barriers to Discharge       Equipment Recommendations  None recommended by OT    Recommendations for Other Services    Frequency Min 2X/week   Progress towards OT Goals Progress towards OT goals: Progressing toward goals  Plan Discharge plan remains appropriate    Precautions / Restrictions Precautions Precautions: Knee Required Braces or Orthoses: Knee Immobilizer - Left Knee Immobilizer - Left: Other (comment) (at night only per new MD orders) Restrictions Weight Bearing Restrictions: Yes LLE Weight Bearing: Weight bearing as tolerated   Pertinent Vitals/Pain Pain 1/10. Repositioned.     ADL  Grooming: Performed;Wash/dry hands;Supervision/safety Where Assessed - Grooming: Unsupported standing Lower Body Dressing: Supervision/safety Where Assessed - Lower Body Dressing: Unsupported sitting (sock) Toilet Transfer: Supervision/safety Toilet Transfer Method: Sit to Barista: Bedside commode;Other (comment) (recliner chair) Tub/Shower Transfer: Simulated;Min guard Tub/Shower Transfer Method: Science writer: Other (comment) (3 in 1) Equipment Used: Gait belt;Rolling walker Transfers/Ambulation Related to ADLs: Supervision ADL Comments: Practiced simulated shower transfer-both by backing into shower and sidstepping. Educated on safe shoes, use of bag on walker, have rugs picked up, and dressing  technique and standing in front of chair/bed with walker in front when pulling up LB clothing. Educated on benefit of reaching down to don/doff left sock as it increases ROM in knee.    OT Diagnosis:    OT Problem List:   OT Treatment Interventions:     OT Goals(current goals can now be found in the care plan section) Acute Rehab OT Goals Patient Stated Goal: to go home OT Goal Formulation: With patient Time For Goal Achievement: 04/30/13 Potential to Achieve Goals: Good ADL Goals Pt Will Perform Lower Body Dressing: with min assist;with adaptive equipment;with caregiver independent in assisting;sit to/from stand Pt Will Transfer to Toilet: with supervision;ambulating;bedside commode Pt Will Perform Toileting - Clothing Manipulation and hygiene: with supervision;sit to/from stand Pt Will Perform Tub/Shower Transfer: with min guard assist;ambulating;3 in 1  Visit Information  Last OT Received On: 04/25/13 Assistance Needed: +1 History of Present Illness: Patient is a 67 yo male s/p Lt TKA    Subjective Data      Prior Functioning       Cognition  Cognition Arousal/Alertness: Awake/alert Behavior During Therapy: WFL for tasks assessed/performed Overall Cognitive Status: Within Functional Limits for tasks assessed    Mobility  Bed Mobility Bed Mobility: Not assessed Transfers Transfers: Sit to Stand;Stand to Sit Sit to Stand: 5: Supervision;With upper extremity assist;From chair/3-in-1 Stand to Sit: 5: Supervision;To chair/3-in-1 Details for Transfer Assistance: cues for technique    Exercises      Balance     End of Session OT - End of Session Equipment Utilized During Treatment: Gait belt;Rolling walker Activity Tolerance: Patient tolerated treatment well Patient left: in chair;with call bell/phone within reach;with family/visitor present;Other (comment) (with Brent-for equipment?) CPM Left Knee CPM Left Knee: Off  GO     Earlie Raveling  OTR/L [phone]  04/25/2013, 12:01 PM

## 2013-04-25 NOTE — Telephone Encounter (Signed)
Staff message sent to [facility] for [facility] to increase CPAP to 10 per compliance report from 01/05/13 through 04/04/13.     Type Date User    Provider Comments 04/21/2013 12:08 PM Reynaldo Minium C      Summary    Provider Comments      Note    [facility]-increase CPAP to 10 per Compliance report from 01-05-13 through 04-04-13.  Dx OSA     ---  I spoke with spouse. She stated pt machine smelss like wire as if it has been burnt up. Per spouse pt has had CPAP machine for a while and is due for a new one. Iinstead of having this serviced they are requesting order for a new machine. Please advise Dr. Maple Hudson thanks Pending appt 05/2013 for sleep consult

## 2013-04-26 ENCOUNTER — Encounter (HOSPITAL_COMMUNITY): Payer: Self-pay | Admitting: Orthopedic Surgery

## 2013-04-26 DIAGNOSIS — I2510 Atherosclerotic heart disease of native coronary artery without angina pectoris: Secondary | ICD-10-CM | POA: Diagnosis not present

## 2013-04-26 DIAGNOSIS — Z96659 Presence of unspecified artificial knee joint: Secondary | ICD-10-CM | POA: Diagnosis not present

## 2013-04-26 DIAGNOSIS — E119 Type 2 diabetes mellitus without complications: Secondary | ICD-10-CM | POA: Diagnosis not present

## 2013-04-26 DIAGNOSIS — Z471 Aftercare following joint replacement surgery: Secondary | ICD-10-CM | POA: Diagnosis not present

## 2013-04-26 DIAGNOSIS — Z5181 Encounter for therapeutic drug level monitoring: Secondary | ICD-10-CM | POA: Diagnosis not present

## 2013-04-26 DIAGNOSIS — M25569 Pain in unspecified knee: Secondary | ICD-10-CM | POA: Diagnosis not present

## 2013-04-27 DIAGNOSIS — N183 Chronic kidney disease, stage 3 unspecified: Secondary | ICD-10-CM | POA: Diagnosis not present

## 2013-04-27 DIAGNOSIS — I10 Essential (primary) hypertension: Secondary | ICD-10-CM | POA: Diagnosis not present

## 2013-04-27 DIAGNOSIS — D649 Anemia, unspecified: Secondary | ICD-10-CM | POA: Diagnosis not present

## 2013-04-27 DIAGNOSIS — E1129 Type 2 diabetes mellitus with other diabetic kidney complication: Secondary | ICD-10-CM | POA: Diagnosis not present

## 2013-04-28 DIAGNOSIS — E119 Type 2 diabetes mellitus without complications: Secondary | ICD-10-CM | POA: Diagnosis not present

## 2013-04-28 DIAGNOSIS — Z471 Aftercare following joint replacement surgery: Secondary | ICD-10-CM | POA: Diagnosis not present

## 2013-04-28 DIAGNOSIS — I2510 Atherosclerotic heart disease of native coronary artery without angina pectoris: Secondary | ICD-10-CM | POA: Diagnosis not present

## 2013-04-28 DIAGNOSIS — Z5181 Encounter for therapeutic drug level monitoring: Secondary | ICD-10-CM | POA: Diagnosis not present

## 2013-04-28 DIAGNOSIS — Z96659 Presence of unspecified artificial knee joint: Secondary | ICD-10-CM | POA: Diagnosis not present

## 2013-04-29 DIAGNOSIS — I2510 Atherosclerotic heart disease of native coronary artery without angina pectoris: Secondary | ICD-10-CM | POA: Diagnosis not present

## 2013-04-29 DIAGNOSIS — E119 Type 2 diabetes mellitus without complications: Secondary | ICD-10-CM | POA: Diagnosis not present

## 2013-04-29 DIAGNOSIS — Z5181 Encounter for therapeutic drug level monitoring: Secondary | ICD-10-CM | POA: Diagnosis not present

## 2013-04-29 DIAGNOSIS — Z96659 Presence of unspecified artificial knee joint: Secondary | ICD-10-CM | POA: Diagnosis not present

## 2013-04-29 DIAGNOSIS — Z471 Aftercare following joint replacement surgery: Secondary | ICD-10-CM | POA: Diagnosis not present

## 2013-05-02 DIAGNOSIS — Z471 Aftercare following joint replacement surgery: Secondary | ICD-10-CM | POA: Diagnosis not present

## 2013-05-02 DIAGNOSIS — I2510 Atherosclerotic heart disease of native coronary artery without angina pectoris: Secondary | ICD-10-CM | POA: Diagnosis not present

## 2013-05-02 DIAGNOSIS — Z5181 Encounter for therapeutic drug level monitoring: Secondary | ICD-10-CM | POA: Diagnosis not present

## 2013-05-02 DIAGNOSIS — E119 Type 2 diabetes mellitus without complications: Secondary | ICD-10-CM | POA: Diagnosis not present

## 2013-05-02 DIAGNOSIS — Z96659 Presence of unspecified artificial knee joint: Secondary | ICD-10-CM | POA: Diagnosis not present

## 2013-05-03 DIAGNOSIS — Z5181 Encounter for therapeutic drug level monitoring: Secondary | ICD-10-CM | POA: Diagnosis not present

## 2013-05-03 DIAGNOSIS — E119 Type 2 diabetes mellitus without complications: Secondary | ICD-10-CM | POA: Diagnosis not present

## 2013-05-03 DIAGNOSIS — Z96659 Presence of unspecified artificial knee joint: Secondary | ICD-10-CM | POA: Diagnosis not present

## 2013-05-03 DIAGNOSIS — I2510 Atherosclerotic heart disease of native coronary artery without angina pectoris: Secondary | ICD-10-CM | POA: Diagnosis not present

## 2013-05-03 DIAGNOSIS — Z471 Aftercare following joint replacement surgery: Secondary | ICD-10-CM | POA: Diagnosis not present

## 2013-05-04 DIAGNOSIS — Z96659 Presence of unspecified artificial knee joint: Secondary | ICD-10-CM | POA: Diagnosis not present

## 2013-05-04 DIAGNOSIS — E119 Type 2 diabetes mellitus without complications: Secondary | ICD-10-CM | POA: Diagnosis not present

## 2013-05-04 DIAGNOSIS — Z471 Aftercare following joint replacement surgery: Secondary | ICD-10-CM | POA: Diagnosis not present

## 2013-05-04 DIAGNOSIS — Z5181 Encounter for therapeutic drug level monitoring: Secondary | ICD-10-CM | POA: Diagnosis not present

## 2013-05-04 DIAGNOSIS — I2510 Atherosclerotic heart disease of native coronary artery without angina pectoris: Secondary | ICD-10-CM | POA: Diagnosis not present

## 2013-05-04 DIAGNOSIS — R300 Dysuria: Secondary | ICD-10-CM | POA: Diagnosis not present

## 2013-05-05 DIAGNOSIS — M1710 Unilateral primary osteoarthritis, unspecified knee: Secondary | ICD-10-CM | POA: Diagnosis not present

## 2013-05-06 DIAGNOSIS — I2510 Atherosclerotic heart disease of native coronary artery without angina pectoris: Secondary | ICD-10-CM | POA: Diagnosis not present

## 2013-05-06 DIAGNOSIS — D500 Iron deficiency anemia secondary to blood loss (chronic): Secondary | ICD-10-CM | POA: Diagnosis not present

## 2013-05-06 DIAGNOSIS — Z96659 Presence of unspecified artificial knee joint: Secondary | ICD-10-CM | POA: Diagnosis not present

## 2013-05-06 DIAGNOSIS — Z5181 Encounter for therapeutic drug level monitoring: Secondary | ICD-10-CM | POA: Diagnosis not present

## 2013-05-06 DIAGNOSIS — I10 Essential (primary) hypertension: Secondary | ICD-10-CM | POA: Diagnosis not present

## 2013-05-06 DIAGNOSIS — E119 Type 2 diabetes mellitus without complications: Secondary | ICD-10-CM | POA: Diagnosis not present

## 2013-05-06 DIAGNOSIS — Z471 Aftercare following joint replacement surgery: Secondary | ICD-10-CM | POA: Diagnosis not present

## 2013-05-11 ENCOUNTER — Other Ambulatory Visit: Payer: Self-pay | Admitting: Internal Medicine

## 2013-05-11 DIAGNOSIS — R351 Nocturia: Secondary | ICD-10-CM

## 2013-05-12 ENCOUNTER — Ambulatory Visit
Admission: RE | Admit: 2013-05-12 | Discharge: 2013-05-12 | Disposition: A | Discharge status: Home / Self Care | Payer: Medicare Other | Source: Ambulatory Visit | Attending: Internal Medicine | Admitting: Internal Medicine

## 2013-05-12 DIAGNOSIS — N281 Cyst of kidney, acquired: Secondary | ICD-10-CM | POA: Diagnosis not present

## 2013-05-12 DIAGNOSIS — R351 Nocturia: Secondary | ICD-10-CM

## 2013-05-13 DIAGNOSIS — I10 Essential (primary) hypertension: Secondary | ICD-10-CM | POA: Diagnosis not present

## 2013-05-13 DIAGNOSIS — Z7901 Long term (current) use of anticoagulants: Secondary | ICD-10-CM | POA: Diagnosis not present

## 2013-05-13 DIAGNOSIS — N183 Chronic kidney disease, stage 3 unspecified: Secondary | ICD-10-CM | POA: Diagnosis not present

## 2013-05-13 DIAGNOSIS — R5381 Other malaise: Secondary | ICD-10-CM | POA: Diagnosis not present

## 2013-05-13 DIAGNOSIS — Z6825 Body mass index (BMI) 25.0-25.9, adult: Secondary | ICD-10-CM | POA: Diagnosis not present

## 2013-05-13 DIAGNOSIS — E1129 Type 2 diabetes mellitus with other diabetic kidney complication: Secondary | ICD-10-CM | POA: Diagnosis not present

## 2013-05-13 DIAGNOSIS — Z96659 Presence of unspecified artificial knee joint: Secondary | ICD-10-CM | POA: Diagnosis not present

## 2013-05-13 DIAGNOSIS — D649 Anemia, unspecified: Secondary | ICD-10-CM | POA: Diagnosis not present

## 2013-05-13 DIAGNOSIS — M1710 Unilateral primary osteoarthritis, unspecified knee: Secondary | ICD-10-CM | POA: Diagnosis not present

## 2013-05-16 DIAGNOSIS — Z96659 Presence of unspecified artificial knee joint: Secondary | ICD-10-CM | POA: Diagnosis not present

## 2013-05-16 DIAGNOSIS — Z7901 Long term (current) use of anticoagulants: Secondary | ICD-10-CM | POA: Diagnosis not present

## 2013-05-17 DIAGNOSIS — M25569 Pain in unspecified knee: Secondary | ICD-10-CM | POA: Diagnosis not present

## 2013-05-18 DIAGNOSIS — Z96659 Presence of unspecified artificial knee joint: Secondary | ICD-10-CM | POA: Diagnosis not present

## 2013-05-18 DIAGNOSIS — M1710 Unilateral primary osteoarthritis, unspecified knee: Secondary | ICD-10-CM | POA: Diagnosis not present

## 2013-05-19 DIAGNOSIS — M25569 Pain in unspecified knee: Secondary | ICD-10-CM | POA: Diagnosis not present

## 2013-05-23 DIAGNOSIS — Z6825 Body mass index (BMI) 25.0-25.9, adult: Secondary | ICD-10-CM | POA: Diagnosis not present

## 2013-05-23 DIAGNOSIS — M25569 Pain in unspecified knee: Secondary | ICD-10-CM | POA: Diagnosis not present

## 2013-05-23 DIAGNOSIS — Z7901 Long term (current) use of anticoagulants: Secondary | ICD-10-CM | POA: Diagnosis not present

## 2013-05-23 DIAGNOSIS — D649 Anemia, unspecified: Secondary | ICD-10-CM | POA: Diagnosis not present

## 2013-05-23 DIAGNOSIS — Z96659 Presence of unspecified artificial knee joint: Secondary | ICD-10-CM | POA: Diagnosis not present

## 2013-05-24 ENCOUNTER — Institutional Professional Consult (permissible substitution): Payer: Medicare Other | Admitting: Internal Medicine

## 2013-05-24 DIAGNOSIS — D235 Other benign neoplasm of skin of trunk: Secondary | ICD-10-CM | POA: Diagnosis not present

## 2013-05-24 DIAGNOSIS — L570 Actinic keratosis: Secondary | ICD-10-CM | POA: Diagnosis not present

## 2013-05-24 DIAGNOSIS — D485 Neoplasm of uncertain behavior of skin: Secondary | ICD-10-CM | POA: Diagnosis not present

## 2013-05-24 DIAGNOSIS — L219 Seborrheic dermatitis, unspecified: Secondary | ICD-10-CM | POA: Diagnosis not present

## 2013-05-25 ENCOUNTER — Ambulatory Visit (INDEPENDENT_AMBULATORY_CARE_PROVIDER_SITE_OTHER): Payer: Medicare Other | Admitting: Podiatry

## 2013-05-25 ENCOUNTER — Encounter: Payer: Self-pay | Admitting: Podiatry

## 2013-05-25 VITALS — BP 136/77 | HR 99 | Resp 16 | Ht 70.0 in | Wt 180.0 lb

## 2013-05-25 DIAGNOSIS — B351 Tinea unguium: Secondary | ICD-10-CM

## 2013-05-25 DIAGNOSIS — M79609 Pain in unspecified limb: Secondary | ICD-10-CM

## 2013-05-25 NOTE — Progress Notes (Signed)
Subjective:     Patient ID: Patrick Miranda, male   DOB: November 11, 1945, 67 y.o.   MRN: [id number]  HPI patient states I need my nails cut and him a diabetic and need my feet checked. States the nails are thick and bothersome and he cannot cut them   Review of Systems  All other systems reviewed and are negative.       Objective:   Physical Exam  Constitutional: He is oriented to person, place, and time.  Cardiovascular: Intact distal pulses.   Musculoskeletal: Normal range of motion.  Neurological: He is oriented to person, place, and time.  Skin: Skin is warm.   patient is found to have normal DTR reflexes and mild diminishment in sharp dull both feet. Nails are thickened with disease 1-5 both feet and painful     Assessment:     At risk diabetic with mycotic nails that are symptomatic both the    Plan:     H&P and diabetic education rendered here debridement of nailbeds 1-5 both feet accomplished

## 2013-05-25 NOTE — Progress Notes (Signed)
  Subjective:    Patient ID: Patrick Miranda, male    DOB: Apr 27, 1946, 67 y.o.   MRN: [id number]  HPI Comments: "Trim the toenails"       Review of Systems  HENT: Positive for tinnitus.   Cardiovascular: Positive for leg swelling.       Recent knee replacement  Musculoskeletal: Positive for arthralgias.       Objective:   Physical Exam        Assessment & Plan:

## 2013-05-26 ENCOUNTER — Other Ambulatory Visit: Payer: Self-pay

## 2013-05-26 DIAGNOSIS — M25569 Pain in unspecified knee: Secondary | ICD-10-CM | POA: Diagnosis not present

## 2013-05-30 DIAGNOSIS — M25569 Pain in unspecified knee: Secondary | ICD-10-CM | POA: Diagnosis not present

## 2013-05-31 DIAGNOSIS — D649 Anemia, unspecified: Secondary | ICD-10-CM | POA: Diagnosis not present

## 2013-05-31 DIAGNOSIS — I10 Essential (primary) hypertension: Secondary | ICD-10-CM | POA: Diagnosis not present

## 2013-05-31 DIAGNOSIS — E1129 Type 2 diabetes mellitus with other diabetic kidney complication: Secondary | ICD-10-CM | POA: Diagnosis not present

## 2013-05-31 DIAGNOSIS — N183 Chronic kidney disease, stage 3 unspecified: Secondary | ICD-10-CM | POA: Diagnosis not present

## 2013-05-31 DIAGNOSIS — Z6829 Body mass index (BMI) 29.0-29.9, adult: Secondary | ICD-10-CM | POA: Diagnosis not present

## 2013-06-01 DIAGNOSIS — M25569 Pain in unspecified knee: Secondary | ICD-10-CM | POA: Diagnosis not present

## 2013-06-23 DIAGNOSIS — E785 Hyperlipidemia, unspecified: Secondary | ICD-10-CM | POA: Diagnosis not present

## 2013-06-23 DIAGNOSIS — D649 Anemia, unspecified: Secondary | ICD-10-CM | POA: Diagnosis not present

## 2013-06-30 DIAGNOSIS — E1139 Type 2 diabetes mellitus with other diabetic ophthalmic complication: Secondary | ICD-10-CM | POA: Diagnosis not present

## 2013-06-30 DIAGNOSIS — I6350 Cerebral infarction due to unspecified occlusion or stenosis of unspecified cerebral artery: Secondary | ICD-10-CM | POA: Diagnosis not present

## 2013-06-30 DIAGNOSIS — Z1331 Encounter for screening for depression: Secondary | ICD-10-CM | POA: Diagnosis not present

## 2013-06-30 DIAGNOSIS — N183 Chronic kidney disease, stage 3 unspecified: Secondary | ICD-10-CM | POA: Diagnosis not present

## 2013-06-30 DIAGNOSIS — E1129 Type 2 diabetes mellitus with other diabetic kidney complication: Secondary | ICD-10-CM | POA: Diagnosis not present

## 2013-06-30 DIAGNOSIS — E785 Hyperlipidemia, unspecified: Secondary | ICD-10-CM | POA: Diagnosis not present

## 2013-06-30 DIAGNOSIS — I10 Essential (primary) hypertension: Secondary | ICD-10-CM | POA: Diagnosis not present

## 2013-06-30 DIAGNOSIS — F329 Major depressive disorder, single episode, unspecified: Secondary | ICD-10-CM | POA: Diagnosis not present

## 2013-06-30 DIAGNOSIS — E1159 Type 2 diabetes mellitus with other circulatory complications: Secondary | ICD-10-CM | POA: Diagnosis not present

## 2013-07-08 ENCOUNTER — Encounter: Payer: Self-pay | Admitting: Internal Medicine

## 2013-07-08 ENCOUNTER — Ambulatory Visit (INDEPENDENT_AMBULATORY_CARE_PROVIDER_SITE_OTHER): Payer: Medicare Other | Admitting: Internal Medicine

## 2013-07-08 VITALS — BP 122/64 | HR 87 | Ht 70.5 in | Wt 189.0 lb

## 2013-07-08 DIAGNOSIS — G4733 Obstructive sleep apnea (adult) (pediatric): Secondary | ICD-10-CM

## 2013-07-08 NOTE — Progress Notes (Signed)
Subjective:    Patient ID: Patrick Miranda, male    DOB: 02-Sep-1945, 67 y.o.   MRN: [id number]  HPI 07/08/13- 73 yoM never smoker Last seen in 2007. Currently still using CPAP machine every night. CPAP 10/ [facility]. Pressure recently increased based on download. NPSG 2007 AHI 31.3/ hr, CPAP to 9, weight 180 lbs. He thinks current pressure is a little high. BIPAP was too hard to use. Nasal pillows mask. Recent head cold. Bedtime 10-12 midnight, sleep latency 30 minutes, waking 4 or 5 times during the night.  Diabetes Health Maintenance Due  Topic Date Due  . Pneumococcal Polysaccharide Vaccine Age 68 And Over  03/03/2010   Past Medical History  Diagnosis Date  . Diabetes mellitus   . Hypertension   . Arthritis   . Hyperlipidemia   . Sleep apnea     on cpap at home  . Cerebral artery disease   . 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 Hernia, umbilical   . Depression    Past Surgical History  Procedure Laterality Date  . Total knee arthroplasty Left 04/22/2013    Procedure: TOTAL KNEE ARTHROPLASTY- left;  Surgeon: Verlee Rossetti, MD;  Location[facility];  Service: Orthopedics;  Laterality: Left;  with femoral block   History reviewed. No pertinent family history. History   Social History  . Marital Status: Married    Spouse Name: N/A    Number of Children: N/A  . Years of Education: N/A   Occupational History  . Not on file.   Social History Main Topics  . Smoking status: Never Smoker   . Smokeless tobacco: Not on file  . Alcohol Use: No  . Drug Use: No  . Sexual Activity: Yes   Other Topics Concern  . Not on file   Social History Narrative  . No narrative on file    Review of Systems  Constitutional: Negative for fever and unexpected weight change.  HENT: Negative for congestion, dental problem, ear pain, nosebleeds, postnasal drip, rhinorrhea, sinus pressure, sneezing, sore throat and trouble swallowing.   Eyes: Negative for redness and itching.   Respiratory: Negative for cough, chest tightness, shortness of breath and wheezing.   Cardiovascular: Negative for palpitations and leg swelling.  Gastrointestinal: Negative for nausea and vomiting.  Genitourinary: Negative for dysuria.  Musculoskeletal: Positive for arthralgias. Negative for joint swelling.  Skin: Negative for rash.  Neurological: Negative for headaches.  Hematological: Does not bruise/bleed easily.  Psychiatric/Behavioral: Negative for dysphoric mood. The patient is not nervous/anxious.        Objective:   Physical Exam OBJ- Physical Exam General- Alert, Oriented, Affect-appropriate, Distress- none acute, trim Skin- rash-none, lesions- none, excoriation- none Lymphadenopathy- none Head- atraumatic            Eyes- Gross vision intact, PERRLA, conjunctivae and secretions clear            Ears- Hearing, canals-normal            Nose- Clear, no-Septal dev, mucus, polyps, erosion, perforation             Throat- Mallampati II , mucosa clear , drainage- none, tonsils- atrophic,hoarse Neck- flexible , trachea midline, no stridor , thyroid nl, carotid no bruit Chest - symmetrical excursion , unlabored           Heart/CV- RRR , no murmur , no gallop  , no rub, nl s1 s2                           -  JVD- none , edema- none, stasis changes- none, varices- none           Lung- clear to P&A, wheeze- none, cough+ slight , dullness-none, rub- none           Chest wall-  Abd- tender-no, distended-no, bowel sounds-present, HSM- no Br/ Gen/ Rectal- Not done, not indicated Extrem- cyanosis- none, clubbing, none, atrophy- none, strength- nl Neuro- grossly intact to observation         Assessment & Plan:

## 2013-07-08 NOTE — Patient Instructions (Signed)
Order- DME- [facility]- reduce CPAP to 9 cwp    Dx OSA

## 2013-08-06 NOTE — Assessment & Plan Note (Signed)
NPSG 2007 AHI 31.3/ hr, CPAP to 9, weight 180 lbs He has been CPAP 10 but thinks that may be too high.compliance and control have been very good. Plan-DME [facility] reduced CPAP to 9. If this is not comfortable, we will auto titrate.

## 2013-08-17 ENCOUNTER — Encounter: Payer: Self-pay | Admitting: Podiatry

## 2013-08-17 ENCOUNTER — Ambulatory Visit (INDEPENDENT_AMBULATORY_CARE_PROVIDER_SITE_OTHER): Payer: Medicare Other | Admitting: Podiatry

## 2013-08-17 VITALS — BP 131/78 | HR 70 | Resp 18

## 2013-08-17 DIAGNOSIS — M79609 Pain in unspecified limb: Secondary | ICD-10-CM

## 2013-08-17 DIAGNOSIS — B351 Tinea unguium: Secondary | ICD-10-CM

## 2013-08-17 NOTE — Progress Notes (Signed)
   Subjective:    Patient ID: Patrick Miranda, male    DOB: [birth year], 68 y.o.   MRN: [id number]  HPI I am here to get my toenails trimmed up    Review of Systems     Objective:   Physical Exam Hypertrophic, incurvated, discolored toenails with palpable tenderness in all 10 nail plates       Assessment & Plan:   Assessment: Symptomatic onychomycoses x10  Plan: Nails x10 debrided back without a bleeding. Reappoint at three-month intervals

## 2013-08-22 ENCOUNTER — Ambulatory Visit: Payer: Medicare Other | Admitting: Podiatry

## 2013-08-31 DIAGNOSIS — N183 Chronic kidney disease, stage 3 unspecified: Secondary | ICD-10-CM | POA: Diagnosis not present

## 2013-08-31 DIAGNOSIS — I10 Essential (primary) hypertension: Secondary | ICD-10-CM | POA: Diagnosis not present

## 2013-08-31 DIAGNOSIS — Z6826 Body mass index (BMI) 26.0-26.9, adult: Secondary | ICD-10-CM | POA: Diagnosis not present

## 2013-08-31 DIAGNOSIS — E1129 Type 2 diabetes mellitus with other diabetic kidney complication: Secondary | ICD-10-CM | POA: Diagnosis not present

## 2013-09-29 DIAGNOSIS — IMO0002 Reserved for concepts with insufficient information to code with codable children: Secondary | ICD-10-CM | POA: Diagnosis not present

## 2013-09-29 DIAGNOSIS — Z96659 Presence of unspecified artificial knee joint: Secondary | ICD-10-CM | POA: Diagnosis not present

## 2013-09-29 DIAGNOSIS — M1710 Unilateral primary osteoarthritis, unspecified knee: Secondary | ICD-10-CM | POA: Diagnosis not present

## 2013-10-07 DIAGNOSIS — E1159 Type 2 diabetes mellitus with other circulatory complications: Secondary | ICD-10-CM | POA: Diagnosis not present

## 2013-10-07 DIAGNOSIS — I6350 Cerebral infarction due to unspecified occlusion or stenosis of unspecified cerebral artery: Secondary | ICD-10-CM | POA: Diagnosis not present

## 2013-10-07 DIAGNOSIS — E785 Hyperlipidemia, unspecified: Secondary | ICD-10-CM | POA: Diagnosis not present

## 2013-10-07 DIAGNOSIS — M1990 Unspecified osteoarthritis, unspecified site: Secondary | ICD-10-CM | POA: Diagnosis not present

## 2013-10-07 DIAGNOSIS — F329 Major depressive disorder, single episode, unspecified: Secondary | ICD-10-CM | POA: Diagnosis not present

## 2013-10-07 DIAGNOSIS — E1139 Type 2 diabetes mellitus with other diabetic ophthalmic complication: Secondary | ICD-10-CM | POA: Diagnosis not present

## 2013-10-07 DIAGNOSIS — E1129 Type 2 diabetes mellitus with other diabetic kidney complication: Secondary | ICD-10-CM | POA: Diagnosis not present

## 2013-10-07 DIAGNOSIS — I10 Essential (primary) hypertension: Secondary | ICD-10-CM | POA: Diagnosis not present

## 2013-11-10 ENCOUNTER — Ambulatory Visit (INDEPENDENT_AMBULATORY_CARE_PROVIDER_SITE_OTHER): Payer: Medicare Other | Admitting: Podiatry

## 2013-11-10 VITALS — BP 125/72 | HR 80 | Resp 12

## 2013-11-10 DIAGNOSIS — B351 Tinea unguium: Secondary | ICD-10-CM

## 2013-11-10 DIAGNOSIS — M2010 Hallux valgus (acquired), unspecified foot: Secondary | ICD-10-CM

## 2013-11-10 DIAGNOSIS — M79609 Pain in unspecified limb: Secondary | ICD-10-CM

## 2013-11-10 NOTE — Progress Notes (Signed)
Subjective:     Patient ID: Patrick Miranda, male   DOB: August 12, 1945, 68 y.o.   MRN: [id number]  HPI patient presents with nail disease 1-5 both feet that are thick painful and impossible for him to cut. Also is concerned about structural hyperostosis medial aspect first metatarsal head left foot he states to be painful at times with deviation of the big toe against the second toe   Review of Systems     Objective:   Physical Exam Neurovascular status was unchanged with patient well oriented x3. Nails are thick and painful when pressed along with griddle appearance 1-5 both feet and there is a hyperostosis with redness around the first metatarsal head left and deviation of the big toe against the second toe    Assessment:     Mycotic nail infection 1-5 both feet with HAV deformity left noted    Plan:     H&P reviewed and I discussed structural bunion. Do not recommend correction and less it were to become increasingly symptomatic and I recommended wider-type shoes with soft leather. Debridement nailbeds 1-5 both feet with no bleeding noted

## 2013-11-29 DIAGNOSIS — E1129 Type 2 diabetes mellitus with other diabetic kidney complication: Secondary | ICD-10-CM | POA: Diagnosis not present

## 2013-11-29 DIAGNOSIS — N183 Chronic kidney disease, stage 3 unspecified: Secondary | ICD-10-CM | POA: Diagnosis not present

## 2013-11-29 DIAGNOSIS — I10 Essential (primary) hypertension: Secondary | ICD-10-CM | POA: Diagnosis not present

## 2013-12-27 ENCOUNTER — Encounter (HOSPITAL_COMMUNITY): Payer: Self-pay | Admitting: Pharmacy Technician

## 2013-12-27 NOTE — Pre-Procedure Instructions (Signed)
[place]  12/27/2013   Your procedure is scheduled on:  Friday, January 06, 2014  Report to [facility] (use Main Entrance "A'') at 5:30 AM.  Call this number if you have problems the morning of surgery: [phone]   Remember:   Do not eat food or drink liquids after midnight.   Take these medicines the morning of surgery with A SIP OF WATER: carvedilol (COREG), if needed:  fluticasone (FLONASE) nasal spray for allergies    Do NOT take any diabetic medications the morning of your surgery     Do not wear jewelry.  Do not wear lotions, powders, or cologne.   Men may shave face and neck.  Do not bring valuables to the hospital.  [facility] is not responsible for any belongings or valuables.               Contacts, dentures or bridgework may not be worn into surgery.  Leave suitcase in the car. After surgery it may be brought to your room.  For patients admitted to the hospital, discharge time is determined by your treatment team.               Patients discharged the day of surgery will not be allowed to drive home.  Name and phone number of your driver:   Special Instructions:  Please shower using CHG soap the night before and the morning of your surgery  Special Instructions:  Pain Booklet, Coughing and Deep Breathing, Blood Transfusion Information, Total Joint Packet, MRSA Information and Surgical Site Infection Prevention

## 2013-12-27 NOTE — H&P (Signed)
Patrick Miranda is an 68 y.o. male.    Chief Complaint: right knee pain  HPI: Pt is a 68 y.o. male complaining of right knee pain for multiple years. Pain had continually increased since the beginning. X-rays in the clinic show end-stage arthritic changes of the right knee. Pt has tried various conservative treatments which have failed to alleviate their symptoms, including injections and therapy. Various options are discussed with the patient. Risks, benefits and expectations were discussed with the patient. Patient understand the risks, benefits and expectations and wishes to proceed with surgery.   PCP:  Geoffery Lyons, MD  D/C Plans:  Home with HHPT  PMH: Past Medical History  Diagnosis Date  . Diabetes mellitus   . Hypertension   . Arthritis   . Hyperlipidemia   . Sleep apnea     on cpap at home  . Cerebral artery disease   . 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 Hernia, umbilical   . Depression     PSH: Past Surgical History  Procedure Laterality Date  . Total knee arthroplasty Left 04/22/2013    Procedure: TOTAL KNEE ARTHROPLASTY- left;  Surgeon: Augustin Schooling, MD;  Location: [facility];  Service: Orthopedics;  Laterality: Left;  with femoral block    Social History:  reports that he has never smoked. He does not have any smokeless tobacco history on file. He reports that he does not drink alcohol or use illicit drugs.  Allergies:  Allergies  Allergen Reactions  . Lipitor [Atorvastatin] Other (See Comments)    Muscle pain    Medications: No current facility-administered medications for this encounter.   Current Outpatient Prescriptions  Medication Sig Dispense Refill  . Canagliflozin (INVOKANA) 100 MG TABS Take 100 mg by mouth daily.      . carvedilol (COREG) 12.5 MG tablet Take 12.5 mg by mouth 2 (two) times daily with a meal.      . fenofibrate 160 MG tablet Take 160 mg by mouth daily.      . ferrous sulfate 325 (65 FE) MG tablet Take 1 tablet (325 mg total)  by mouth 3 (three) times daily after meals.  30 tablet  0  . fluticasone (FLONASE) 50 MCG/ACT nasal spray Place 1 spray into both nostrils daily as needed for allergies or rhinitis.      Marland Kitchen glyBURIDE-metformin (GLUCOVANCE) 5-500 MG per tablet Take 2 tablets by mouth 2 (two) times daily with a meal.       . losartan-hydrochlorothiazide (HYZAAR) 100-25 MG per tablet Take 1 tablet by mouth at bedtime.       . triamcinolone cream (KENALOG) 0.1 % Apply 1 application topically 2 (two) times daily.      . clopidogrel (PLAVIX) 75 MG tablet Take 75 mg by mouth daily with breakfast.        No results found for this or any previous visit (from the past 48 hour(s)). No results found.  ROS: Pain with rom of the right lower extremity  Physical Exam:  Alert and oriented 68 y.o. male in no acute distress Cranial nerves 2-12 intact Cervical spine: full rom with no tenderness, nv intact distally Chest: active breath sounds bilaterally, no wheeze rhonchi or rales Heart: regular rate and rhythm, no murmur Abd: non tender non distended with active bowel sounds Hip is stable with rom  Right knee with moderate crepitus with rom nv intact distally No rashes or edema Slight quad avoidance gait  Assessment/Plan Assessment: right knee end stage osteoarthritis  Plan: Patient will undergo a right total knee arthroplasty by Dr. Veverly Fells at [facility]. Risks benefits and expectations were discussed with the patient. Patient understand risks, benefits and expectations and wishes to proceed.

## 2013-12-28 ENCOUNTER — Encounter (HOSPITAL_COMMUNITY)
Admission: RE | Admit: 2013-12-28 | Discharge: 2013-12-28 | Disposition: A | Discharge status: Home / Self Care | Payer: Medicare Other | Source: Ambulatory Visit | Attending: Orthopedic Surgery | Admitting: Orthopedic Surgery

## 2013-12-28 ENCOUNTER — Encounter (HOSPITAL_COMMUNITY): Payer: Self-pay

## 2013-12-28 DIAGNOSIS — Z01812 Encounter for preprocedural laboratory examination: Secondary | ICD-10-CM | POA: Insufficient documentation

## 2013-12-28 HISTORY — DX: Unspecified hearing loss, unspecified ear: H91.90

## 2013-12-28 LAB — CBC
HCT: 36.4 % — ABNORMAL LOW (ref 39.0–52.0)
Hemoglobin: 12.1 g/dL — ABNORMAL LOW (ref 13.0–17.0)
MCH: 30.6 pg (ref 26.0–34.0)
MCHC: 33.2 g/dL (ref 30.0–36.0)
MCV: 92.2 fL (ref 78.0–100.0)
PLATELETS: 333 10*3/uL (ref 150–400)
RBC: 3.95 MIL/uL — ABNORMAL LOW (ref 4.22–5.81)
RDW: 13.2 % (ref 11.5–15.5)
WBC: 5.2 10*3/uL (ref 4.0–10.5)

## 2013-12-28 LAB — TYPE AND SCREEN
ABO/RH(D): O POS
Antibody Screen: NEGATIVE

## 2013-12-28 LAB — SURGICAL PCR SCREEN
MRSA, PCR: NEGATIVE
Staphylococcus aureus: NEGATIVE

## 2013-12-28 LAB — BASIC METABOLIC PANEL
BUN: 24 mg/dL — AB (ref 6–23)
CALCIUM: 9.4 mg/dL (ref 8.4–10.5)
CHLORIDE: 104 meq/L (ref 96–112)
CO2: 23 meq/L (ref 19–32)
CREATININE: 1.6 mg/dL — AB (ref 0.50–1.35)
GFR calc Af Amer: 49 mL/min — ABNORMAL LOW (ref >90)
GFR calc non Af Amer: 43 mL/min — ABNORMAL LOW (ref >90)
GLUCOSE: 166 mg/dL — AB (ref 70–99)
Potassium: 4.1 mEq/L (ref 3.7–5.3)
Sodium: 142 mEq/L (ref 137–147)

## 2013-12-28 LAB — PROTIME-INR
INR: 1 (ref 0.00–1.49)
Prothrombin Time: 13 seconds (ref 11.6–15.2)

## 2013-12-28 LAB — APTT: aPTT: 29 seconds (ref 24–37)

## 2013-12-28 NOTE — Progress Notes (Signed)
Patient denied having a stress test, or cardiac cath, but informed Nurse that he has sleep apnea and wears a CPAP nightly. Patient instructed to bring CPAP mask DOS. Patient and wife both verbalized understanding. PCP is Burnard Bunting. When asked about Plavix patient stated he stopped taking it yesterday.

## 2013-12-29 NOTE — Progress Notes (Signed)
Anesthesia Chart Review: Patient is a 69 year old male scheduled for right TKA on 01/06/14 by Dr. Veverly Fells. 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 PCP is Dr. Burnard Bunting.  Carotid duplex on 09/07/11 showed no significant extracranial carotid artery stenosis. Antegrade vertebral artery flow.   EKG on 04/19/13 showed NSR. There were Q waves in lead III--minimal in aVF. There interpreting cardiologist felt EKG was WNL.   Echo on 2/22/13showed borderline concentric LVH, normal LV systolic function, EF > 17%, no regional wall motion abnormalities. Doppler flow pattern is normal for age. Mildly dilated LA. Mild MR/TR. Trace AR/PR.   CXR on 04/19/13 showed no acute cardiopulmonary disease.   Preoperative labs noted. Cr 1.6, up from 1.46 on 04/413. Glucose 166. H/H 12.1/36.4.  He tolerated similar surgery approximately 8 months ago.  I think he will need his renal function monitored post operatively, but unless there is an acute change I anticipate that he can proceed as planned.  George [facility] Short Stay Center/Anesthesiology Phone [phone] 12/29/2013 3:02 PM

## 2014-01-03 DIAGNOSIS — E1129 Type 2 diabetes mellitus with other diabetic kidney complication: Secondary | ICD-10-CM | POA: Diagnosis not present

## 2014-01-03 DIAGNOSIS — N183 Chronic kidney disease, stage 3 unspecified: Secondary | ICD-10-CM | POA: Diagnosis not present

## 2014-01-03 DIAGNOSIS — I10 Essential (primary) hypertension: Secondary | ICD-10-CM | POA: Diagnosis not present

## 2014-01-03 DIAGNOSIS — M1990 Unspecified osteoarthritis, unspecified site: Secondary | ICD-10-CM | POA: Diagnosis not present

## 2014-01-05 MED ORDER — CEFAZOLIN SODIUM-DEXTROSE 2-3 GM-% IV SOLR
2.0000 g | INTRAVENOUS | Status: AC
  Administered 2014-01-06: 2 g via INTRAVENOUS
  Filled 2014-01-05: qty 50

## 2014-01-06 ENCOUNTER — Encounter (HOSPITAL_COMMUNITY): Payer: Self-pay | Admitting: Surgery

## 2014-01-06 ENCOUNTER — Encounter (HOSPITAL_COMMUNITY)
Admission: RE | Disposition: A | Discharge status: Home Health | Payer: Self-pay | Source: Ambulatory Visit | Attending: Orthopedic Surgery

## 2014-01-06 ENCOUNTER — Inpatient Hospital Stay (HOSPITAL_COMMUNITY)
Admission: RE | Admit: 2014-01-06 | Discharge: 2014-01-09 | DRG: 470 | Disposition: A | Discharge status: Home Health | Payer: Medicare Other | Source: Ambulatory Visit | Attending: Orthopedic Surgery | Admitting: Orthopedic Surgery

## 2014-01-06 ENCOUNTER — Inpatient Hospital Stay (HOSPITAL_COMMUNITY): Payer: Medicare Other | Admitting: Anesthesiology

## 2014-01-06 ENCOUNTER — Inpatient Hospital Stay (HOSPITAL_COMMUNITY): Payer: Medicare Other

## 2014-01-06 ENCOUNTER — Encounter (HOSPITAL_COMMUNITY): Payer: Medicare Other | Admitting: Vascular Surgery

## 2014-01-06 DIAGNOSIS — Z8673 Personal history of transient ischemic attack (TIA), and cerebral infarction without residual deficits: Secondary | ICD-10-CM | POA: Diagnosis not present

## 2014-01-06 DIAGNOSIS — M25569 Pain in unspecified knee: Secondary | ICD-10-CM | POA: Diagnosis not present

## 2014-01-06 DIAGNOSIS — Z471 Aftercare following joint replacement surgery: Secondary | ICD-10-CM | POA: Diagnosis not present

## 2014-01-06 DIAGNOSIS — E785 Hyperlipidemia, unspecified: Secondary | ICD-10-CM | POA: Diagnosis present

## 2014-01-06 DIAGNOSIS — IMO0002 Reserved for concepts with insufficient information to code with codable children: Secondary | ICD-10-CM | POA: Diagnosis not present

## 2014-01-06 DIAGNOSIS — E119 Type 2 diabetes mellitus without complications: Secondary | ICD-10-CM | POA: Diagnosis present

## 2014-01-06 DIAGNOSIS — F3289 Other specified depressive episodes: Secondary | ICD-10-CM | POA: Diagnosis present

## 2014-01-06 DIAGNOSIS — I10 Essential (primary) hypertension: Secondary | ICD-10-CM | POA: Diagnosis present

## 2014-01-06 DIAGNOSIS — F329 Major depressive disorder, single episode, unspecified: Secondary | ICD-10-CM | POA: Diagnosis present

## 2014-01-06 DIAGNOSIS — M1711 Unilateral primary osteoarthritis, right knee: Secondary | ICD-10-CM | POA: Diagnosis present

## 2014-01-06 DIAGNOSIS — M1710 Unilateral primary osteoarthritis, unspecified knee: Principal | ICD-10-CM | POA: Diagnosis present

## 2014-01-06 DIAGNOSIS — G8918 Other acute postprocedural pain: Secondary | ICD-10-CM | POA: Diagnosis not present

## 2014-01-06 DIAGNOSIS — Z96659 Presence of unspecified artificial knee joint: Secondary | ICD-10-CM | POA: Diagnosis not present

## 2014-01-06 HISTORY — PX: TOTAL KNEE ARTHROPLASTY: SHX125

## 2014-01-06 LAB — GLUCOSE, CAPILLARY
Glucose-Capillary: 185 mg/dL — ABNORMAL HIGH (ref 70–99)
Glucose-Capillary: 136 mg/dL — ABNORMAL HIGH (ref 70–99)
Glucose-Capillary: 165 mg/dL — ABNORMAL HIGH (ref 70–99)
Glucose-Capillary: 177 mg/dL — ABNORMAL HIGH (ref 70–99)

## 2014-01-06 SURGERY — ARTHROPLASTY, KNEE, TOTAL
Anesthesia: Spinal | Site: Knee | Laterality: Right

## 2014-01-06 MED ORDER — METHOCARBAMOL 500 MG PO TABS: 500.0000 mg | ORAL_TABLET | Freq: Three times a day (TID) | ORAL | Status: DC | PRN

## 2014-01-06 MED ORDER — ONDANSETRON HCL 4 MG/2ML IJ SOLN
INTRAMUSCULAR | Status: DC | PRN
  Administered 2014-01-06: 4 mg via INTRAVENOUS

## 2014-01-06 MED ORDER — ONDANSETRON HCL 4 MG/2ML IJ SOLN
INTRAMUSCULAR | Status: AC
  Filled 2014-01-06: qty 2

## 2014-01-06 MED ORDER — OXYCODONE HCL 5 MG/5ML PO SOLN: 5.0000 mg | Freq: Once | ORAL | Status: DC | PRN

## 2014-01-06 MED ORDER — BUPIVACAINE HCL (PF) 0.5 % IJ SOLN
INTRAMUSCULAR | Status: DC | PRN
  Administered 2014-01-06: 25 mL via PERINEURAL

## 2014-01-06 MED ORDER — FLUTICASONE PROPIONATE 50 MCG/ACT NA SUSP
1.0000 | Freq: Every day | NASAL | Status: DC | PRN
  Filled 2014-01-06: qty 16

## 2014-01-06 MED ORDER — WARFARIN VIDEO: Freq: Once | Status: DC

## 2014-01-06 MED ORDER — INSULIN DETEMIR 100 UNIT/ML ~~LOC~~ SOLN
10.0000 [IU] | Freq: Every day | SUBCUTANEOUS | Status: DC
  Administered 2014-01-06 – 2014-01-08 (×3): 10 [IU] via SUBCUTANEOUS
  Filled 2014-01-06 (×4): qty 0.1

## 2014-01-06 MED ORDER — MIDAZOLAM HCL 2 MG/2ML IJ SOLN
INTRAMUSCULAR | Status: AC
  Filled 2014-01-06: qty 2

## 2014-01-06 MED ORDER — METHOCARBAMOL 1000 MG/10ML IJ SOLN
500.0000 mg | Freq: Four times a day (QID) | INTRAVENOUS | Status: DC | PRN
  Filled 2014-01-06: qty 5

## 2014-01-06 MED ORDER — HYDROCHLOROTHIAZIDE 25 MG PO TABS
25.0000 mg | ORAL_TABLET | Freq: Every day | ORAL | Status: DC
  Administered 2014-01-06 – 2014-01-08 (×3): 25 mg via ORAL
  Filled 2014-01-06 (×4): qty 1

## 2014-01-06 MED ORDER — SODIUM CHLORIDE 0.9 % IV SOLN
INTRAVENOUS | Status: DC
  Administered 2014-01-07 (×2): via INTRAVENOUS

## 2014-01-06 MED ORDER — INSULIN ASPART 100 UNIT/ML ~~LOC~~ SOLN: 0.0000 [IU] | Freq: Every day | SUBCUTANEOUS | Status: DC

## 2014-01-06 MED ORDER — OXYCODONE HCL 5 MG PO TABS
5.0000 mg | ORAL_TABLET | ORAL | Status: DC | PRN
  Administered 2014-01-06 (×4): 5 mg via ORAL
  Administered 2014-01-07 – 2014-01-08 (×8): 10 mg via ORAL
  Administered 2014-01-08: 5 mg via ORAL
  Filled 2014-01-06: qty 1
  Filled 2014-01-06 (×3): qty 2
  Filled 2014-01-06: qty 1
  Filled 2014-01-06 (×2): qty 2
  Filled 2014-01-06: qty 1
  Filled 2014-01-06 (×4): qty 2
  Filled 2014-01-06: qty 1

## 2014-01-06 MED ORDER — ONDANSETRON HCL 4 MG PO TABS: 4.0000 mg | ORAL_TABLET | Freq: Four times a day (QID) | ORAL | Status: DC | PRN

## 2014-01-06 MED ORDER — WARFARIN SODIUM 7.5 MG PO TABS
7.5000 mg | ORAL_TABLET | Freq: Once | ORAL | Status: AC
  Administered 2014-01-06: 7.5 mg via ORAL
  Filled 2014-01-06: qty 1

## 2014-01-06 MED ORDER — LOSARTAN POTASSIUM-HCTZ 100-25 MG PO TABS: 1.0000 | ORAL_TABLET | Freq: Every day | ORAL | Status: DC

## 2014-01-06 MED ORDER — INSULIN ASPART 100 UNIT/ML ~~LOC~~ SOLN
4.0000 [IU] | Freq: Three times a day (TID) | SUBCUTANEOUS | Status: DC
  Administered 2014-01-06 – 2014-01-09 (×8): 4 [IU] via SUBCUTANEOUS

## 2014-01-06 MED ORDER — CHLORHEXIDINE GLUCONATE 4 % EX LIQD
60.0000 mL | Freq: Once | CUTANEOUS | Status: DC
  Filled 2014-01-06: qty 60

## 2014-01-06 MED ORDER — FENTANYL CITRATE 0.05 MG/ML IJ SOLN
INTRAMUSCULAR | Status: DC | PRN
  Administered 2014-01-06: 50 ug via INTRAVENOUS
  Administered 2014-01-06 (×3): 25 ug via INTRAVENOUS

## 2014-01-06 MED ORDER — BUPIVACAINE IN DEXTROSE 0.75-8.25 % IT SOLN
INTRATHECAL | Status: DC | PRN
  Administered 2014-01-06: 2 mL via INTRATHECAL

## 2014-01-06 MED ORDER — FERROUS SULFATE 325 (65 FE) MG PO TABS
325.0000 mg | ORAL_TABLET | Freq: Three times a day (TID) | ORAL | Status: DC
Start: 2014-01-06 — End: 2014-01-06

## 2014-01-06 MED ORDER — LIDOCAINE HCL (CARDIAC) 20 MG/ML IV SOLN
INTRAVENOUS | Status: AC
  Filled 2014-01-06: qty 5

## 2014-01-06 MED ORDER — METOCLOPRAMIDE HCL 5 MG PO TABS
5.0000 mg | ORAL_TABLET | Freq: Three times a day (TID) | ORAL | Status: DC | PRN
  Filled 2014-01-06: qty 2

## 2014-01-06 MED ORDER — CEFAZOLIN SODIUM-DEXTROSE 2-3 GM-% IV SOLR
2.0000 g | Freq: Four times a day (QID) | INTRAVENOUS | Status: AC
  Administered 2014-01-06 (×2): 2 g via INTRAVENOUS
  Filled 2014-01-06 (×2): qty 50

## 2014-01-06 MED ORDER — PROPOFOL 10 MG/ML IV BOLUS
INTRAVENOUS | Status: AC
  Filled 2014-01-06: qty 20

## 2014-01-06 MED ORDER — MIDAZOLAM HCL 5 MG/5ML IJ SOLN
INTRAMUSCULAR | Status: DC | PRN
  Administered 2014-01-06: 2 mg via INTRAVENOUS

## 2014-01-06 MED ORDER — HYDROMORPHONE HCL PF 1 MG/ML IJ SOLN
1.0000 mg | INTRAMUSCULAR | Status: DC | PRN
  Administered 2014-01-06 – 2014-01-07 (×3): 1 mg via INTRAVENOUS
  Filled 2014-01-06 (×3): qty 1

## 2014-01-06 MED ORDER — FERROUS SULFATE 325 (65 FE) MG PO TABS
325.0000 mg | ORAL_TABLET | Freq: Three times a day (TID) | ORAL | Status: DC
  Administered 2014-01-06 – 2014-01-09 (×9): 325 mg via ORAL
  Filled 2014-01-06 (×12): qty 1

## 2014-01-06 MED ORDER — FENOFIBRATE 160 MG PO TABS
160.0000 mg | ORAL_TABLET | Freq: Every day | ORAL | Status: DC
  Administered 2014-01-06 – 2014-01-09 (×4): 160 mg via ORAL
  Filled 2014-01-06 (×4): qty 1

## 2014-01-06 MED ORDER — COUMADIN BOOK
Freq: Once | Status: AC
  Administered 2014-01-06: 15:00:00
  Filled 2014-01-06: qty 1

## 2014-01-06 MED ORDER — LACTATED RINGERS IV SOLN
INTRAVENOUS | Status: DC | PRN
  Administered 2014-01-06 (×2): via INTRAVENOUS

## 2014-01-06 MED ORDER — 0.9 % SODIUM CHLORIDE (POUR BTL) OPTIME
TOPICAL | Status: DC | PRN
  Administered 2014-01-06: 1000 mL

## 2014-01-06 MED ORDER — POLYETHYLENE GLYCOL 3350 17 G PO PACK
17.0000 g | PACK | Freq: Every day | ORAL | Status: DC | PRN
  Administered 2014-01-08: 17 g via ORAL
  Filled 2014-01-06: qty 1

## 2014-01-06 MED ORDER — CARVEDILOL 12.5 MG PO TABS
12.5000 mg | ORAL_TABLET | Freq: Two times a day (BID) | ORAL | Status: DC
  Administered 2014-01-06 – 2014-01-09 (×5): 12.5 mg via ORAL
  Filled 2014-01-06 (×8): qty 1

## 2014-01-06 MED ORDER — ACETAMINOPHEN 650 MG RE SUPP: 650.0000 mg | Freq: Four times a day (QID) | RECTAL | Status: DC | PRN

## 2014-01-06 MED ORDER — FENTANYL CITRATE 0.05 MG/ML IJ SOLN
INTRAMUSCULAR | Status: AC
  Filled 2014-01-06: qty 5

## 2014-01-06 MED ORDER — PROPOFOL INFUSION 10 MG/ML OPTIME
INTRAVENOUS | Status: DC | PRN
  Administered 2014-01-06: 25 ug/kg/min via INTRAVENOUS

## 2014-01-06 MED ORDER — OXYCODONE-ACETAMINOPHEN 5-325 MG PO TABS: 1.0000 | ORAL_TABLET | ORAL | Status: DC | PRN

## 2014-01-06 MED ORDER — MENTHOL 3 MG MT LOZG: 1.0000 | LOZENGE | OROMUCOSAL | Status: DC | PRN

## 2014-01-06 MED ORDER — HYDROMORPHONE HCL PF 1 MG/ML IJ SOLN
0.2500 mg | INTRAMUSCULAR | Status: DC | PRN
  Administered 2014-01-06: 0.5 mg via INTRAVENOUS

## 2014-01-06 MED ORDER — ACETAMINOPHEN 325 MG PO TABS
650.0000 mg | ORAL_TABLET | Freq: Four times a day (QID) | ORAL | Status: DC | PRN
  Administered 2014-01-07 – 2014-01-09 (×4): 650 mg via ORAL
  Filled 2014-01-06 (×5): qty 2

## 2014-01-06 MED ORDER — CLOPIDOGREL BISULFATE 75 MG PO TABS
75.0000 mg | ORAL_TABLET | Freq: Every day | ORAL | Status: DC
  Administered 2014-01-07 – 2014-01-09 (×3): 75 mg via ORAL
  Filled 2014-01-06 (×5): qty 1

## 2014-01-06 MED ORDER — TRIAMCINOLONE ACETONIDE 0.1 % EX CREA
1.0000 "application " | TOPICAL_CREAM | Freq: Two times a day (BID) | CUTANEOUS | Status: DC
  Administered 2014-01-06 – 2014-01-08 (×2): 1 via TOPICAL
  Filled 2014-01-06: qty 15

## 2014-01-06 MED ORDER — METOCLOPRAMIDE HCL 5 MG/ML IJ SOLN: 5.0000 mg | Freq: Three times a day (TID) | INTRAMUSCULAR | Status: DC | PRN

## 2014-01-06 MED ORDER — ROCURONIUM BROMIDE 50 MG/5ML IV SOLN
INTRAVENOUS | Status: AC
  Filled 2014-01-06: qty 1

## 2014-01-06 MED ORDER — INSULIN ASPART 100 UNIT/ML ~~LOC~~ SOLN
0.0000 [IU] | Freq: Three times a day (TID) | SUBCUTANEOUS | Status: DC
  Administered 2014-01-06 – 2014-01-07 (×4): 3 [IU] via SUBCUTANEOUS
  Administered 2014-01-08 (×3): 5 [IU] via SUBCUTANEOUS
  Administered 2014-01-09: 3 [IU] via SUBCUTANEOUS

## 2014-01-06 MED ORDER — LOSARTAN POTASSIUM 50 MG PO TABS
100.0000 mg | ORAL_TABLET | Freq: Every day | ORAL | Status: DC
  Administered 2014-01-06 – 2014-01-08 (×3): 100 mg via ORAL
  Filled 2014-01-06 (×4): qty 2

## 2014-01-06 MED ORDER — WARFARIN - PHARMACIST DOSING INPATIENT: Freq: Every day | Status: DC

## 2014-01-06 MED ORDER — WARFARIN SODIUM 5 MG PO TABS: 5.0000 mg | ORAL_TABLET | Freq: Every day | ORAL | Status: DC

## 2014-01-06 MED ORDER — GLYBURIDE-METFORMIN 5-500 MG PO TABS: 2.0000 | ORAL_TABLET | Freq: Two times a day (BID) | ORAL | Status: DC

## 2014-01-06 MED ORDER — METHOCARBAMOL 500 MG PO TABS
500.0000 mg | ORAL_TABLET | Freq: Four times a day (QID) | ORAL | Status: DC | PRN
  Administered 2014-01-06 – 2014-01-08 (×7): 500 mg via ORAL
  Filled 2014-01-06 (×7): qty 1

## 2014-01-06 MED ORDER — BISACODYL 10 MG RE SUPP: 10.0000 mg | Freq: Every day | RECTAL | Status: DC | PRN

## 2014-01-06 MED ORDER — PHENOL 1.4 % MT LIQD: 1.0000 | OROMUCOSAL | Status: DC | PRN

## 2014-01-06 MED ORDER — OXYCODONE HCL 5 MG PO TABS: 5.0000 mg | ORAL_TABLET | Freq: Once | ORAL | Status: DC | PRN

## 2014-01-06 MED ORDER — HYDROMORPHONE HCL PF 1 MG/ML IJ SOLN
INTRAMUSCULAR | Status: AC
  Filled 2014-01-06: qty 1

## 2014-01-06 MED ORDER — METOCLOPRAMIDE HCL 5 MG/ML IJ SOLN: 10.0000 mg | Freq: Once | INTRAMUSCULAR | Status: DC | PRN

## 2014-01-06 MED ORDER — GLYBURIDE 5 MG PO TABS
10.0000 mg | ORAL_TABLET | Freq: Two times a day (BID) | ORAL | Status: DC
  Administered 2014-01-06 – 2014-01-09 (×6): 10 mg via ORAL
  Filled 2014-01-06 (×8): qty 2

## 2014-01-06 MED ORDER — SODIUM CHLORIDE 0.9 % IR SOLN
Status: DC | PRN
  Administered 2014-01-06: 1000 mL

## 2014-01-06 MED ORDER — ONDANSETRON HCL 4 MG/2ML IJ SOLN: 4.0000 mg | Freq: Four times a day (QID) | INTRAMUSCULAR | Status: DC | PRN

## 2014-01-06 SURGICAL SUPPLY — 55 items
BANDAGE ELASTIC 6 VELCRO ST LF (GAUZE/BANDAGES/DRESSINGS) ×2 IMPLANT
BANDAGE ESMARK 6X9 LF (GAUZE/BANDAGES/DRESSINGS) ×1 IMPLANT
BANDAGE GAUZE ELAST BULKY 4 IN (GAUZE/BANDAGES/DRESSINGS) ×2 IMPLANT
BLADE SAG 18X100X1.27 (BLADE) ×2 IMPLANT
BLADE SAW SGTL 13.0X1.19X90.0M (BLADE) ×2 IMPLANT
BNDG ELASTIC 6X10 VLCR STRL LF (GAUZE/BANDAGES/DRESSINGS) ×2 IMPLANT
BNDG ESMARK 6X9 LF (GAUZE/BANDAGES/DRESSINGS) ×2
BOWL SMART MIX CTS (DISPOSABLE) ×2 IMPLANT
CAPT RP KNEE ×2 IMPLANT
CEMENT HV SMART SET (Cement) ×4 IMPLANT
COVER SURGICAL LIGHT HANDLE (MISCELLANEOUS) ×2 IMPLANT
CUFF TOURNIQUET SINGLE 34IN LL (TOURNIQUET CUFF) ×2 IMPLANT
CUFF TOURNIQUET SINGLE 44IN (TOURNIQUET CUFF) IMPLANT
DRAPE EXTREMITY T 121X128X90 (DRAPE) ×2 IMPLANT
DRAPE PROXIMA HALF (DRAPES) ×2 IMPLANT
DRAPE U-SHAPE 47X51 STRL (DRAPES) ×2 IMPLANT
DRSG ADAPTIC 3X8 NADH LF (GAUZE/BANDAGES/DRESSINGS) ×2 IMPLANT
DRSG PAD ABDOMINAL 8X10 ST (GAUZE/BANDAGES/DRESSINGS) ×4 IMPLANT
DURAPREP 26ML APPLICATOR (WOUND CARE) ×2 IMPLANT
ELECT CAUTERY BLADE 6.4 (BLADE) ×2 IMPLANT
ELECT REM PT RETURN 9FT ADLT (ELECTROSURGICAL) ×2
ELECTRODE REM PT RTRN 9FT ADLT (ELECTROSURGICAL) ×1 IMPLANT
GLOVE BIOGEL PI ORTHO PRO 7.5 (GLOVE) ×1
GLOVE BIOGEL PI ORTHO PRO SZ8 (GLOVE) ×1
GLOVE ORTHO TXT STRL SZ7.5 (GLOVE) ×2 IMPLANT
GLOVE PI ORTHO PRO STRL 7.5 (GLOVE) ×1 IMPLANT
GLOVE PI ORTHO PRO STRL SZ8 (GLOVE) ×1 IMPLANT
GLOVE SURG ORTHO 8.5 STRL (GLOVE) ×2 IMPLANT
GOWN STRL REUS W/ TWL XL LVL3 (GOWN DISPOSABLE) ×3 IMPLANT
GOWN STRL REUS W/TWL XL LVL3 (GOWN DISPOSABLE) ×3
HANDPIECE INTERPULSE COAX TIP (DISPOSABLE) ×1
IMMOBILIZER KNEE 22 UNIV (SOFTGOODS) ×2 IMPLANT
KIT BASIN OR (CUSTOM PROCEDURE TRAY) ×2 IMPLANT
KIT MANIFOLD (MISCELLANEOUS) ×2 IMPLANT
KIT ROOM TURNOVER OR (KITS) ×2 IMPLANT
MANIFOLD NEPTUNE II (INSTRUMENTS) ×2 IMPLANT
NS IRRIG 1000ML POUR BTL (IV SOLUTION) ×2 IMPLANT
PACK TOTAL JOINT (CUSTOM PROCEDURE TRAY) ×2 IMPLANT
PAD ABD 8X10 STRL (GAUZE/BANDAGES/DRESSINGS) ×2 IMPLANT
PAD ARMBOARD 7.5X6 YLW CONV (MISCELLANEOUS) ×4 IMPLANT
SET HNDPC FAN SPRY TIP SCT (DISPOSABLE) ×1 IMPLANT
SPONGE GAUZE 4X4 12PLY (GAUZE/BANDAGES/DRESSINGS) ×2 IMPLANT
STRIP CLOSURE SKIN 1/2X4 (GAUZE/BANDAGES/DRESSINGS) ×2 IMPLANT
SUCTION FRAZIER TIP 10 FR DISP (SUCTIONS) ×2 IMPLANT
SUT MNCRL AB 3-0 PS2 18 (SUTURE) ×2 IMPLANT
SUT VIC AB 0 CT1 27 (SUTURE) ×2
SUT VIC AB 0 CT1 27XBRD ANBCTR (SUTURE) ×2 IMPLANT
SUT VIC AB 1 CT1 27 (SUTURE) ×3
SUT VIC AB 1 CT1 27XBRD ANBCTR (SUTURE) ×3 IMPLANT
SUT VIC AB 2-0 CT1 27 (SUTURE) ×2
SUT VIC AB 2-0 CT1 TAPERPNT 27 (SUTURE) ×2 IMPLANT
TOWEL OR 17X24 6PK STRL BLUE (TOWEL DISPOSABLE) ×2 IMPLANT
TOWEL OR 17X26 10 PK STRL BLUE (TOWEL DISPOSABLE) ×2 IMPLANT
TRAY FOLEY CATH 16FRSI W/METER (SET/KITS/TRAYS/PACK) ×2 IMPLANT
WATER STERILE IRR 1000ML POUR (IV SOLUTION) ×4 IMPLANT

## 2014-01-06 NOTE — Progress Notes (Signed)
ANTICOAGULATION CONSULT NOTE - Initial Consult  Pharmacy Consult for Coumadin Indication: VTE prophylaxis s/p R. TKA  Allergies  Allergen Reactions  . Lipitor [Atorvastatin] Other (See Comments)    Muscle pain    Patient Measurements: Height: 5' 10.5" (179.1 cm) (from 12/28/13 preadmission visit) Weight: 178 lb 12.7 oz (81.1 kg) (from 12/28/13 preadmission visit) IBW/kg (Calculated) : 74.15   Vital Signs: Temp: 97.4 F (36.3 C) (06/19 1117) Temp src: Oral (06/19 0546) BP: 118/60 mmHg (06/19 1116) Pulse Rate: 51 (06/19 1116)  Labs: Preadmission labs on 12/28/13:  APTT = 29 PT = 13.0 INR = 1.0 Hgb = 12.1 Hct = 36.4 Pltc = 333  No results found for this basename: HGB, HCT, PLT, APTT, LABPROT, INR, HEPARINUNFRC, CREATININE, CKTOTAL, CKMB, TROPONINI,  in the last 72 hours  Estimated Creatinine Clearance: 46.4 ml/min (by C-G formula based on Cr of 1.6).   Medical History: Past Medical History  Diagnosis Date  . Diabetes mellitus   . Hypertension   . Arthritis   . Hyperlipidemia   . Sleep apnea     on cpap at home  . Cerebral artery disease   . Hernia, umbilical   . Depression   . 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    affected patients speech  . HOH (hard of hearing)     Medications:  Prescriptions prior to admission  Medication Sig Dispense Refill  . Canagliflozin (INVOKANA) 100 MG TABS Take 100 mg by mouth daily.      . carvedilol (COREG) 12.5 MG tablet Take 12.5 mg by mouth 2 (two) times daily with a meal.      . clopidogrel (PLAVIX) 75 MG tablet Take 75 mg by mouth daily with breakfast.      . fenofibrate 160 MG tablet Take 160 mg by mouth daily.      . ferrous sulfate 325 (65 FE) MG tablet Take 1 tablet (325 mg total) by mouth 3 (three) times daily after meals.  30 tablet  0  . fluticasone (FLONASE) 50 MCG/ACT nasal spray Place 1 spray into both nostrils daily as needed for allergies or rhinitis.      Marland Kitchen glyBURIDE-metformin (GLUCOVANCE) 5-500 MG per tablet Take 2  tablets by mouth 2 (two) times daily with a meal.       . insulin detemir (LEVEMIR) 100 UNIT/ML injection Inject 10 Units into the skin daily. Takes at bedtime      . losartan-hydrochlorothiazide (HYZAAR) 100-25 MG per tablet Take 1 tablet by mouth at bedtime.       . triamcinolone cream (KENALOG) 0.1 % Apply 1 application topically 2 (two) times daily.        Assessment: 68 y.o male with right knee end-stage OA.  Today he is s/p R. TKA and will begin coumadin for DVT prophylaxis.  Baseline INR = 1.0,  Hgb 12.1, pltc 333K.  No bleeding complications noted.   Goal of Therapy:  INR 2-3 Monitor platelets by anticoagulation protocol: Yes   Plan:  Coumadin 7.5 mg today @ 18:00 Monitor daily PT/INR.   Nicole Cella, RPh Clinical Pharmacist Pager: [phone] 01/06/2014,11:53 AM

## 2014-01-06 NOTE — Progress Notes (Signed)
Patient started on CPAP therapy for HS.  Initially, patient states that his home setting is 9.  Patient states this is too much pressure, so titrated down to 7, then 6 with each setting being too much pressure per patient.  Finally, patient was placed on auto-titration mode with minimum 4, maximum 20 cmH20.  Patient was able to tolerate this.  Nasal prongs from home used, room air.  Patient is familiar with equipment and procedure.

## 2014-01-06 NOTE — Progress Notes (Signed)
Pt cont to be bradycardic. Pt is awake alert, and asymptomatic. Dr.Crews notified. No new orders. Okay w/ pt being d/c'd to the floor at this time. Will cont to monitor.

## 2014-01-06 NOTE — Progress Notes (Signed)
PHARMACIST - PHYSICIAN COMMUNICATION DR:  Veverly Fells CONCERNING:  METFORMIN SAFE ADMINISTRATION POLICY  RECOMMENDATION: Metformin has been placed on DISCONTINUE (rejected order) STATUS and should be reordered only after any of the conditions below are ruled out.  Current safety recommendations include avoiding metformin for a minimum of 48 hours after the patient's exposure to intravenous contrast media.  DESCRIPTION:  The Pharmacy Committee has adopted a policy that restricts the use of metformin in hospitalized patients until all the contraindications to administration have been ruled out. Specific contraindications are: [x]  Serum creatinine ? 1.5 for males []  Serum creatinine ? 1.4 for females []  Shock, acute MI, sepsis, hypoxemia, dehydration []  Planned administration of intravenous iodinated contrast media []  Heart Failure patients with low EF []  Acute or chronic metabolic acidosis (including DKA)     Alycia Rossetti, PharmD, BCPS Clinical Pharmacist Pager: [phone] 01/06/2014 11:50 AM

## 2014-01-06 NOTE — Anesthesia Procedure Notes (Addendum)
Spinal  Patient location during procedure: OR Start time: 01/06/2014 7:38 AM End time: 01/06/2014 7:48 AM Staffing Anesthesiologist: Fulton Reek Preanesthetic Checklist Completed: patient identified, site marked, surgical consent, pre-op evaluation, timeout performed, IV checked, risks and benefits discussed and monitors and equipment checked Spinal Block Patient position: sitting Prep: Betadine Patient monitoring: heart rate, cardiac monitor, continuous pulse ox and blood pressure Approach: midline Location: L2-3 Injection technique: single-shot Needle Needle type: Whitacre  Needle gauge: 22 G Needle length: 9 cm Needle insertion depth: 7 cm Assessment Sensory level: T6 Events: paresthesia Additional Notes SPD&T, without heme, paresthesia to L leg x2, & failed in R LD due to low CSF pressure. Dural puncture x 1-2 but didn't inject due to poor CSF flow. Only minimal difficulty in sitting position due to boney obstruction. Clear csf. CF  Anesthesia Regional Block:  Femoral nerve block  Pre-Anesthetic Checklist: ,, timeout performed, Correct Patient, Correct Site, Correct Laterality, Correct Procedure, Correct Position, site marked, Risks and benefits discussed,  Surgical consent,  Pre-op evaluation,  At surgeon's request and post-op pain management  Laterality: Right  Prep: chloraprep       Needles:   Needle Type: Other     Needle Length: 9cm 9 cm Needle Gauge: 21 and 21 G    Additional Needles:  Procedures: ultrasound guided (picture in chart) Femoral nerve block Narrative:  Start time: 01/06/2014 7:07 AM End time: 01/06/2014 7:15 AM Injection made incrementally with aspirations every 5 mL.  Performed by: Personally  Anesthesiologist: C. Frederick MD  Additional Notes: Ultrasound guidance used to: id relevant anatomy, confirm needle position, local anesthetic spread, avoidance of vascular puncture. Picture saved. No complications. Block performed  personally by Jessy Oto. Albertina Parr, MD     Procedure Name: [facility] Date/Time: 01/06/2014 7:53 AM Performed by: Kyung Rudd Pre-anesthesia Checklist: Patient identified, Emergency Drugs available, Suction available, Patient being monitored and Timeout performed Patient Re-evaluated:Patient Re-evaluated prior to inductionOxygen Delivery Method: Simple face mask Preoxygenation: Pre-oxygenation with 100% oxygen Intubation Type: IV induction Placement Confirmation: positive ETCO2

## 2014-01-06 NOTE — Op Note (Signed)
[name], [name] NO.:  [id number]  MEDICAL RECORD NO.:  [id number]  LOCATION:  [facility]                         FACILITY:  [facility]  PHYSICIAN:  Doran Heater. Veverly Fells, M.D. DATE OF BIRTH:  Oct 19, 1945  DATE OF PROCEDURE:  01/06/2014 DATE OF DISCHARGE:                              OPERATIVE REPORT   PREOPERATIVE DIAGNOSIS:  Right knee end-stage osteoarthritis.  POSTOPERATIVE DIAGNOSIS:  Right knee end-stage osteoarthritis.  PROCEDURE PERFORMED:  Right total knee arthroplasty using DePuy Sigma RP prosthesis.  ATTENDING SURGEON:  Doran Heater. Veverly Fells, MD.  ASSISTANT:  Abbott Pao. Dixon, PA-C, who was scrubbed during the entire procedure and necessary for satisfactory completion of surgery.  ANESTHESIA:  Spinal anesthesia was used.  ESTIMATED BLOOD LOSS:  Minimal.  FLUID REPLACEMENT:  1000 mL crystalloid.  INSTRUMENT COUNTS:  Correct.  COMPLICATIONS:  There were no complications.  ANTIBIOTICS:  Perioperative antibiotics were given.  INDICATIONS:  The patient is a 68 year old male with worsening right knee pain secondary to end-stage arthritis.  The patient has failed all measures of conservative management including injections, modification of activity, and pain medications.  The patient has had a left total knee replacement done well with that.  Presents now for right total knee arthroplasty.  Risks and benefits of surgery were discussed.  The patient can walk no more than a couple blocks before having to sit down, and also uses the assistance of a cane occasionally for balance.  At this point, the patient is committed to knee replacement surgery to restore function, eliminate pain to his knee.  Informed consent obtained.  DESCRIPTION OF PROCEDURE:  After adequate level of anesthesia achieved, the patient was positioned supine in the operating room table.  Right leg correctly identified.  Nonsterile tourniquet placed proximal thigh. Sterile prep and drape of the  right knee performed.  Time-out called. We exsanguinated the limb using Esmarch bandage and elevating the tourniquet to 300 mmHg.  Longitudinal midline incision was created with the knee in flexion with a #10 blade scalpel.  Dissection down through subcutaneous tissues.  A fresh #10 blade scalpel was used to perform a medial parapatellar arthrotomy.  Divided the lateral patellofemoral ligaments and everted the patella.  We flexed the knee, entered the distal femur using a step-cut drill.  We then placed our intramedullary distal femoral resection guide set on 5 degrees with 10 mm dissection on the right.  We went ahead and resected our distal femur and measured our femur size 4 anterior down, and placed our 4 in 1 cutting block, and then did our anterior, posterior, and chamfer cuts off that block.  We removed excess bony debris and spurs.  We then removed ACL, PCL, and meniscal tissues subluxing the tibia anteriorly and then going ahead and using an extramedullary jig to cut our tibia at 90 degrees perpendicular to the long axis of the tibia with minimal posterior slope, just resecting 2 mm off the affected medial side.  We then went ahead and removed excess posterior bone off the posterior femur using lamina spreader and a curved osteotomes.  Next, we went ahead and checked our spacing with a 10 spacer both in extension and flexion.  We had symmetric  gaps.  We thoroughly irrigated.  We then went ahead and placed the knee back up in flexion and finished our tibial preparation with the modular drill and keel punch.  We then went ahead and used the box cut guide for the resecting the box for the posterior cruciate substituting prosthesis and that was fit with size 4.  Once we had that, we placed our size 4 right trial femur on and then reduced with a 12.5 poly trial. We had nice extension and excellent symmetry both in flexion and extension stability.  We then resected resurfaced our  patella using a resurfacing cutting guide.  The patellar thickness was 26 mm precut and we went ahead and resected 9 mm down to 17 mm of bone.  We then went ahead and drilled our lug holes for the 38 patella and then placed the trial patella in place ranging the knee with no instability with a no- touch technique.  We thoroughly irrigated the knee.  We removed all trial components and then cemented the components into place using DePuy high viscosity cement.  Again 4 tibia, 4 right femur, and then placed a 12.5 trial poly in.  We placed the knee in extension and holding that until the cement hardened.  We also cemented the patellar component in place and holding that with patellar clamp.  We removed all excess bone with quarter-inch curved quarter-inch curved osteotome once the cement hardened.  We thoroughly irrigated the wound, and then selected a 12.5 poly insert and exchanged that for the trial that we had.  Once we had that 12.5 and we reduced the knee.  We were again stable in flexion and extension, able to achieve full extension.  We then went ahead and thoroughly irrigated with a pulse irrigator, repaired the median parapatellar arthrotomy with interrupted #1 Vicryl suture followed by 0 and 2-0 Vicryl layered subcutaneous closure and 4-0 running Monocryl for skin.  Steri-Strips applied followed by sterile dressing.  The patient tolerated the surgery well.     Doran Heater. Veverly Fells, M.D.     SRN/MEDQ  D:  01/06/2014  T:  01/06/2014  Job:  [id number]

## 2014-01-06 NOTE — Anesthesia Postprocedure Evaluation (Signed)
Anesthesia Post Note  Patient: Patrick Miranda  Procedure(s) Performed: Procedure(s) (LRB): RIGHT TOTAL KNEE ARTHROPLASTY (Right)  Anesthesia type: Spinal  Patient location: PACU  Post pain: Pain level controlled  Post assessment: Patient's Cardiovascular Status Stable  Last Vitals:  Filed Vitals:   01/06/14 1200  BP: 129/76  Pulse: 54  Temp: 36.3 C  Resp: 16    Post vital signs: Reviewed and stable  Level of consciousness: alert  Complications: No apparent anesthesia complications    Motor function returned below T-12

## 2014-01-06 NOTE — Plan of Care (Signed)
Problem: Consults Goal: Diagnosis- Total Joint Replacement Primary Total Knee Right Knee

## 2014-01-06 NOTE — Anesthesia Preprocedure Evaluation (Signed)
Anesthesia Evaluation  Patient identified by MRN, date of birth, ID band Patient awake    Reviewed: Allergy & Precautions, H&P , NPO status , Patient's Chart, lab work & pertinent test results, reviewed documented beta blocker date and time   Airway Mallampati: II TM Distance: >3 FB Neck ROM: full    Dental   Pulmonary sleep apnea and Continuous Positive Airway Pressure Ventilation ,  breath sounds clear to auscultation        Cardiovascular hypertension, On Medications and On Home Beta Blockers Rhythm:regular     Neuro/Psych PSYCHIATRIC DISORDERS CVA, No Residual Symptoms    GI/Hepatic negative GI ROS, Neg liver ROS,   Endo/Other  negative endocrine ROSdiabetes  Renal/GU Renal InsufficiencyRenal disease  negative genitourinary   Musculoskeletal   Abdominal   Peds  Hematology negative hematology ROS (+)   Anesthesia Other Findings See surgeon's H&P   Reproductive/Obstetrics negative OB ROS                           Anesthesia Physical Anesthesia Plan  ASA: III  Anesthesia Plan: Spinal   Post-op Pain Management: MAC Combined w/ Regional for Post-op pain   Induction:   Airway Management Planned: Simple Face Mask  Additional Equipment:   Intra-op Plan:   Post-operative Plan:   Informed Consent: I have reviewed the patients History and Physical, chart, labs and discussed the procedure including the risks, benefits and alternatives for the proposed anesthesia with the patient or authorized representative who has indicated his/her understanding and acceptance.   Dental Advisory Given  Plan Discussed with: CRNA and Surgeon  Anesthesia Plan Comments:         Anesthesia Quick Evaluation

## 2014-01-06 NOTE — Interval H&P Note (Signed)
History and Physical Interval Note:  01/06/2014 7:28 AM  Patrick Miranda  has presented today for surgery, with the diagnosis of RIGHT KNEE OA  The various methods of treatment have been discussed with the patient and family. After consideration of risks, benefits and other options for treatment, the patient has consented to  Procedure(s): RIGHT TOTAL KNEE ARTHROPLASTY (Right) as a surgical intervention .  The patient's history has been reviewed, patient examined, no change in status, stable for surgery.  I have reviewed the patient's chart and labs.  Questions were answered to the patient's satisfaction.     NORRIS,STEVEN R

## 2014-01-06 NOTE — Progress Notes (Signed)
Utilization review completed.  

## 2014-01-06 NOTE — Brief Op Note (Signed)
01/06/2014  10:03 AM  PATIENT:  Patrick Miranda  68 y.o. male  PRE-OPERATIVE DIAGNOSIS:  RIGHT KNEE OA, end stage  POST-OPERATIVE DIAGNOSIS:  Right knee OA, end stage  PROCEDURE:  Procedure(s): RIGHT TOTAL KNEE ARTHROPLASTY (Right), DePuy Sigma RP  SURGEON:  Surgeon(s) and Role:    * Augustin Schooling, MD - Primary  PHYSICIAN ASSISTANT:   ASSISTANTS: Ventura Bruns, PA-C  ANESTHESIA:   Spinal  EBL:  Total I/O In: 1000 [I.V.:1000] Out: 325 [Urine:325]  BLOOD ADMINISTERED:none  DRAINS: done  LOCAL MEDICATIONS USED:  none  SPECIMEN:  done  DISPOSITION OF SPECIMEN:  n/a  COUNTS:  correct  TOURNIQUET:   Total Tourniquet Time Documented: Upper Arm (Right) - 98 minutes Total: Upper Arm (Right) - 98 minutes   DICTATION: other dicatation # [id number]  PLAN OF CARE: admit  PATIENT DISPOSITION:  Admit    Delay start of Pharmacological VTE agent (>24hrs) due to surgical blood loss or risk of bleeding: no

## 2014-01-06 NOTE — Transfer of Care (Signed)
Immediate Anesthesia Transfer of Care Note  Patient: Patrick Miranda  Procedure(s) Performed: Procedure(s): RIGHT TOTAL KNEE ARTHROPLASTY (Right)  Patient Location: PACU  Anesthesia Type:MAC  Level of Consciousness: awake, alert  and oriented  Airway & Oxygen Therapy: Patient Spontanous Breathing and Patient connected to face mask oxygen  Post-op Assessment: Report given to PACU RN, Post -op Vital signs reviewed and stable and Patient moving all extremities X 4  Post vital signs: Reviewed and stable  Complications: No apparent anesthesia complications

## 2014-01-06 NOTE — Evaluation (Addendum)
Physical Therapy Evaluation Patient Details Name: Patrick Miranda MRN: [id number] DOB: 06-30-1946 Today's Date: 01/06/2014   History of Present Illness  pt presents with R TKA and hx L TKA in October 2014.    Clinical Impression  Pt moving well despite nausea and vomiting after returning to room.  Pt ahs all needed DME from previous TKA and will have good support from family at home.  Will continue to follow.      Follow Up Recommendations Home health PT;Supervision - Intermittent    Equipment Recommendations  None recommended by PT    Recommendations for Other Services       Precautions / Restrictions Precautions Precautions: Fall;Knee Precaution Booklet Issued: Yes (comment) Required Braces or Orthoses: Knee Immobilizer - Right Knee Immobilizer - Right: On when out of bed or walking;Discontinue once straight leg raise with < 10 degree lag Restrictions Weight Bearing Restrictions: Yes RLE Weight Bearing: Weight bearing as tolerated      Mobility  Bed Mobility Overal bed mobility: Needs Assistance Bed Mobility: Supine to Sit     Supine to sit: Min assist     General bed mobility comments: A with R LE only.    Transfers Overall transfer level: Needs assistance Equipment used: Rolling walker (2 wheeled) Transfers: Sit to/from Stand Sit to Stand: Min assist         General transfer comment: cues for UE use and controlling descent to sitting.    Ambulation/Gait Ambulation/Gait assistance: Min guard Ambulation Distance (Feet): 50 Feet (x2) Assistive device: Rolling walker (2 wheeled) Gait Pattern/deviations: Step-to pattern;Decreased step length - left;Decreased stance time - right     General Gait Details: cues for uprigth posture, gait sequencing.  pt had L TKA in October and having difficulty reversing sequencing of gait.    Stairs            Wheelchair Mobility    Modified Rankin (Stroke Patients Only)       Balance Overall balance  assessment: Needs assistance Sitting-balance support: No upper extremity supported;Feet supported Sitting balance-Leahy Scale: Fair     Standing balance support: Bilateral upper extremity supported Standing balance-Leahy Scale: Poor                               Pertinent Vitals/Pain 5/10 during mobility.  Premedicated.      Home Living Family/patient expects to be discharged to:: Private residence Living Arrangements: Spouse/significant other Available Help at Discharge: Family;Available 24 hours/day Type of Home: House Home Access: Stairs to enter Entrance Stairs-Rails: None Entrance Stairs-Number of Steps: 1 Home Layout: One level Home Equipment: Walker - 2 wheels;Bedside commode      Prior Function Level of Independence: Independent               Hand Dominance        Extremity/Trunk Assessment   Upper Extremity Assessment: Defer to OT evaluation           Lower Extremity Assessment: RLE deficits/detail RLE Deficits / Details: ROM and strength limited by post op pain.      Cervical / Trunk Assessment: Normal  Communication   Communication: No difficulties  Cognition Arousal/Alertness: Awake/alert Behavior During Therapy: WFL for tasks assessed/performed Overall Cognitive Status: Within Functional Limits for tasks assessed                      General Comments      Exercises Total  Joint Exercises Ankle Circles/Pumps: AROM;Both;10 reps Quad Sets: AROM;Both;10 reps      Assessment/Plan    PT Assessment Patient needs continued PT services  PT Diagnosis Abnormality of gait;Acute pain   PT Problem List Decreased strength;Decreased range of motion;Decreased activity tolerance;Decreased balance;Decreased mobility;Decreased knowledge of use of DME;Pain  PT Treatment Interventions DME instruction;Gait training;Stair training;Functional mobility training;Therapeutic activities;Therapeutic exercise;Balance training;Patient/family  education   PT Goals (Current goals can be found in the Care Plan section) Acute Rehab PT Goals Patient Stated Goal: Walk normal PT Goal Formulation: With patient Time For Goal Achievement: 01/13/14 Potential to Achieve Goals: Good    Frequency 7X/week   Barriers to discharge        Co-evaluation               End of Session Equipment Utilized During Treatment: Gait belt;Right knee immobilizer Activity Tolerance: Patient tolerated treatment well Patient left: in chair;with call bell/phone within reach;with family/visitor present Nurse Communication: Mobility status         Time: 1345-1423 PT Time Calculation (min): 38 min   Charges:   PT Evaluation $Initial PT Evaluation Tier I: 1 Procedure PT Treatments $Gait Training: 8-22 mins $Therapeutic Activity: 8-22 mins   PT G CodesCatarina Hartshorn, [place] 01/06/2014, 2:40 PM

## 2014-01-06 NOTE — Progress Notes (Signed)
Orthopedic Tech Progress Note Patient Details:  Patrick Miranda 09/24/1945 [id number]  CPM Right Knee CPM Right Knee: On Right Knee Flexion (Degrees): 60 Right Knee Extension (Degrees): 0 Additional Comments: Trapeze bar and foot roll   Cammer, Theodoro Parma 01/06/2014, 12:08 PM

## 2014-01-07 LAB — PROTIME-INR
INR: 1.14 (ref 0.00–1.49)
Prothrombin Time: 14.4 seconds (ref 11.6–15.2)

## 2014-01-07 LAB — GLUCOSE, CAPILLARY
GLUCOSE-CAPILLARY: 176 mg/dL — AB (ref 70–99)
Glucose-Capillary: 175 mg/dL — ABNORMAL HIGH (ref 70–99)
Glucose-Capillary: 180 mg/dL — ABNORMAL HIGH (ref 70–99)
Glucose-Capillary: 190 mg/dL — ABNORMAL HIGH (ref 70–99)

## 2014-01-07 MED ORDER — WARFARIN SODIUM 7.5 MG PO TABS
7.5000 mg | ORAL_TABLET | Freq: Once | ORAL | Status: AC
  Administered 2014-01-07: 7.5 mg via ORAL
  Filled 2014-01-07: qty 1

## 2014-01-07 MED ORDER — TAMSULOSIN HCL 0.4 MG PO CAPS
0.4000 mg | ORAL_CAPSULE | Freq: Every day | ORAL | Status: DC
  Administered 2014-01-07 – 2014-01-08 (×2): 0.4 mg via ORAL
  Filled 2014-01-07 (×3): qty 1

## 2014-01-07 NOTE — Progress Notes (Signed)
OT Cancellation Note  Patient Details Name: Princeton Nabor Sleight MRN: [id number] DOB: 10/14/1945   Cancelled Treatment:    Reason Eval/Treat Not Completed: OT screened, no needs identified, will sign off. Pt had other knee done in Oct 2014 and has all needed DME as well as prn A at home.  Almon Jahnke [phone] 01/07/2014, 11:00 AM

## 2014-01-07 NOTE — Progress Notes (Signed)
ANTICOAGULATION CONSULT NOTE - Follow Up Consult  Pharmacy Consult for coumadin Indication: VTE prophylaxis  Allergies  Allergen Reactions  . Lipitor [Atorvastatin] Other (See Comments)    Muscle pain    Patient Measurements: Height: 5' 10.5" (179.1 cm) (from 12/28/13 preadmission visit) Weight: 178 lb 12.7 oz (81.1 kg) (from 12/28/13 preadmission visit) IBW/kg (Calculated) : 74.15   Vital Signs: Temp: 98.6 F (37 C) (06/20 0504) BP: 105/52 mmHg (06/20 0504) Pulse Rate: 78 (06/20 0504)  Labs:  Recent Labs  01/07/14 0500  LABPROT 14.4  INR 1.14    Estimated Creatinine Clearance: 46.4 ml/min (by C-G formula based on Cr of 1.6).  Assessment: Patient is a 68 y.o M on coumadin for VTE prophylaxis s/p right TKA.  INR is 1.14 after first dose of coumadin given last night.  No bleeding documented.  Goal of Therapy:  INR 2-3    Plan:  1) repeat coumadin 7.5mg  PO x1 today  Pham, Anh P 01/07/2014,10:42 AM

## 2014-01-07 NOTE — Progress Notes (Signed)
Referral received for SNF. Chart reviewed and CSW has spoken with patient who stated that he will return home at DC with [place] and DME. He lives with his wife and has daughter for support as well.  PT recommends HHPT.   CSW to sign off. Please re-consult if CSW needs arise.  Lorie Phenix. [place], [place]

## 2014-01-07 NOTE — Discharge Instructions (Signed)
Ice to the knee at all times,  CPM at home 6-8 hours per day in two hour sessions,  Do not prop anything behind the knee, prop under the heel to encourage extension,  Dangle knees multiple times per day to allow for 90 degrees of bend  Keep incision clean and dry for one week, then ok to get wet in the shower.  Follow up with Merla Riches, PA-C in the office in two weeks  [phone] ____________________________________ Information on my medicine - Coumadin   (Warfarin)  This medication education was reviewed with me or my healthcare representative as part of my discharge preparation.  The pharmacist that spoke with me during my hospital stay was:  Lynelle Doctor, [facility]  Why was Coumadin prescribed for you? Coumadin was prescribed for you because you have a blood clot or a medical condition that can cause an increased risk of forming blood clots. Blood clots can cause serious health problems by blocking the flow of blood to the heart, lung, or brain. Coumadin can prevent harmful blood clots from forming. As a reminder your indication for Coumadin is:   Blood Clot Prevention After Orthopedic Surgery  What test will check on my response to Coumadin? While on Coumadin (warfarin) you will need to have an INR test regularly to ensure that your dose is keeping you in the desired range. The INR (international normalized ratio) number is calculated from the result of the laboratory test called prothrombin time (PT).  If an INR APPOINTMENT HAS NOT ALREADY BEEN MADE FOR YOU please schedule an appointment to have this lab work done by your health care provider within 7 days. Your INR goal is usually a number between:  2 to 3 or your provider may give you a more narrow range like 2-2.5.  Ask your health care provider during an office visit what your goal INR is.  What  do you need to  know  About  COUMADIN? Take Coumadin (warfarin) exactly as prescribed by your healthcare provider about the same time each day.   DO NOT stop taking without talking to the doctor who prescribed the medication.  Stopping without other blood clot prevention medication to take the place of Coumadin may increase your risk of developing a new clot or stroke.  Get refills before you run out.  What do you do if you miss a dose? If you miss a dose, take it as soon as you remember on the same day then continue your regularly scheduled regimen the next day.  Do not take two doses of Coumadin at the same time.  Important Safety Information A possible side effect of Coumadin (Warfarin) is an increased risk of bleeding. You should call your healthcare provider right away if you experience any of the following:   Bleeding from an injury or your nose that does not stop.   Unusual colored urine (red or dark brown) or unusual colored stools (red or black).   Unusual bruising for unknown reasons.   A serious fall or if you hit your head (even if there is no bleeding).  Some foods or medicines interact with Coumadin (warfarin) and might alter your response to warfarin. To help avoid this:   Eat a balanced diet, maintaining a consistent amount of Vitamin K.   Notify your provider about major diet changes you plan to make.   Avoid alcohol or limit your intake to 1 drink for women and 2 drinks for men per day. (1 drink is  5 oz. wine, 12 oz. beer, or 1.5 oz. liquor.)  Make sure that ANY health care provider who prescribes medication for you knows that you are taking Coumadin (warfarin).  Also make sure the healthcare provider who is monitoring your Coumadin knows when you have started a new medication including herbals and non-prescription products.  Coumadin (Warfarin)  Major Drug Interactions  Increased Warfarin Effect Decreased Warfarin Effect  Alcohol (large quantities) Antibiotics (esp. Septra/Bactrim, Flagyl, Cipro) Amiodarone (Cordarone) Aspirin (ASA) Cimetidine (Tagamet) Megestrol (Megace) NSAIDs (ibuprofen, naproxen,  etc.) Piroxicam (Feldene) Propafenone (Rythmol SR) Propranolol (Inderal) Isoniazid (INH) Posaconazole (Noxafil) Barbiturates (Phenobarbital) Carbamazepine (Tegretol) Chlordiazepoxide (Librium) Cholestyramine (Questran) Griseofulvin Oral Contraceptives Rifampin Sucralfate (Carafate) Vitamin K   Coumadin (Warfarin) Major Herbal Interactions  Increased Warfarin Effect Decreased Warfarin Effect  Garlic Ginseng Ginkgo biloba Coenzyme Q10 Green tea St. Johns wort    Coumadin (Warfarin) FOOD Interactions  Eat a consistent number of servings per week of foods HIGH in Vitamin K (1 serving =  cup)  Collards (cooked, or boiled & drained) Kale (cooked, or boiled & drained) Mustard greens (cooked, or boiled & drained) Parsley *serving size only =  cup Spinach (cooked, or boiled & drained) Swiss chard (cooked, or boiled & drained) Turnip greens (cooked, or boiled & drained)  Eat a consistent number of servings per week of foods MEDIUM-HIGH in Vitamin K (1 serving = 1 cup)  Asparagus (cooked, or boiled & drained) Broccoli (cooked, boiled & drained, or raw & chopped) Brussel sprouts (cooked, or boiled & drained) *serving size only =  cup Lettuce, raw (green leaf, endive, romaine) Spinach, raw Turnip greens, raw & chopped   These websites have more information on Coumadin (warfarin):  FailFactory.se; VeganReport.com.au;

## 2014-01-07 NOTE — Progress Notes (Signed)
Physical Therapy Treatment Patient Details Name: Patrick Miranda MRN: [id number] DOB: [birth year] Today's Date: 01/07/2014    History of Present Illness      PT Comments    Pt progressing well.  Continue with current POC.  Follow Up Recommendations  Home health PT;Supervision - Intermittent     Equipment Recommendations  None recommended by PT    Recommendations for Other Services       Precautions / Restrictions Precautions Precautions: Fall;Knee Required Braces or Orthoses: Knee Immobilizer - Right Knee Immobilizer - Right: On when out of bed or walking;Discontinue once straight leg raise with < 10 degree lag Restrictions RLE Weight Bearing: Weight bearing as tolerated    Mobility  Bed Mobility   Bed Mobility: Sit to Supine       Sit to supine: Min assist   General bed mobility comments: assist with RLE only  Transfers   Equipment used: Rolling walker (2 wheeled)   Sit to Stand: Min assist         General transfer comment: verbal cues for hand placement, sequencing  Ambulation/Gait Ambulation/Gait assistance: Min guard Ambulation Distance (Feet): 100 Feet Assistive device: Rolling walker (2 wheeled) Gait Pattern/deviations: Step-to pattern;Decreased stride length;Antalgic Gait velocity: decreased       Stairs            Wheelchair Mobility    Modified Rankin (Stroke Patients Only)       Balance                                    Cognition Arousal/Alertness: Awake/alert Behavior During Therapy: WFL for tasks assessed/performed Overall Cognitive Status: Within Functional Limits for tasks assessed                      Exercises Total Joint Exercises Ankle Circles/Pumps: AROM;Both;10 reps Quad Sets: AROM;Right;10 reps Heel Slides: AAROM;Right;10 reps Hip ABduction/ADduction: AAROM;Right;10 reps Goniometric ROM: 0-60 R knee AAROM in supine    General Comments        Pertinent Vitals/Pain 6/10     Home Living                      Prior Function            PT Goals (current goals can now be found in the care plan section) Progress towards PT goals: Progressing toward goals    Frequency  7X/week    PT Plan Current plan remains appropriate    Co-evaluation             End of Session Equipment Utilized During Treatment: Gait belt;Right knee immobilizer Activity Tolerance: Patient tolerated treatment well Patient left: in bed;in CPM;with call bell/phone within reach;with family/visitor present     Time: 5956-3875 PT Time Calculation (min): 31 min  Charges:  $Gait Training: 8-22 mins $Therapeutic Exercise: 8-22 mins                    G Codes:      Lorriane Shire 01/07/2014, 9:55 AM  Lorrin Goodell, PT  Office # [phone] Pager [phone]

## 2014-01-07 NOTE — Progress Notes (Signed)
Pt has home CPAP machine and places self on/off machine.

## 2014-01-07 NOTE — Progress Notes (Signed)
Orthopedics Progress Note  Subjective: I feel fine this AM  Objective:  Filed Vitals:   01/07/14 0504  BP: 105/52  Pulse: 78  Temp: 98.6 F (37 C)  Resp: 16    General: Awake and alert  Musculoskeletal: right knee dressing intact no drainage Neurovascularly intact  Lab Results  Component Value Date   WBC 5.2 12/28/2013   HGB 12.1* 12/28/2013   HCT 36.4* 12/28/2013   MCV 92.2 12/28/2013   PLT 333 12/28/2013       Component Value Date/Time   NA 142 12/28/2013 0917   K 4.1 12/28/2013 0917   CL 104 12/28/2013 0917   CO2 23 12/28/2013 0917   GLUCOSE 166* 12/28/2013 0917   BUN 24* 12/28/2013 0917   CREATININE 1.60* 12/28/2013 0917   CALCIUM 9.4 12/28/2013 0917   GFRNONAA 43* 12/28/2013 0917   GFRAA 49* 12/28/2013 0917    Lab Results  Component Value Date   INR 1.14 01/07/2014   INR 1.00 12/28/2013   INR 1.77* 04/25/2013    Assessment/Plan: POD #1 s/p Procedure(s): RIGHT TOTAL KNEE ARTHROPLASTY OOB, mobilization WBAT DVT prophylaxis coumadin and mechanical D/C planning likely Monday  Remo Lipps R. Veverly Fells, MD 01/07/2014 7:34 AM

## 2014-01-07 NOTE — Progress Notes (Signed)
Physical Therapy Treatment Patient Details Name: Jakeb Lamping Custard MRN: [id number] DOB: 10/14/1945 Today's Date: 01/07/2014    History of Present Illness      PT Comments    Pt very lethargic this afternoon.  Continuous cues to stay on task and stay awake.  Follow Up Recommendations  Home health PT;Supervision - Intermittent     Equipment Recommendations  None recommended by PT    Recommendations for Other Services       Precautions / Restrictions Precautions Precautions: Fall;Knee Required Braces or Orthoses: Knee Immobilizer - Right Knee Immobilizer - Right: On when out of bed or walking;Discontinue once straight leg raise with < 10 degree lag Restrictions RLE Weight Bearing: Weight bearing as tolerated    Mobility  Bed Mobility           Sit to supine: Min assist   General bed mobility comments: assist with RLE  Transfers   Equipment used: Rolling walker (2 wheeled)   Sit to Stand: Min assist         General transfer comment: verbal cues for hand placement, sequencing  Ambulation/Gait Ambulation/Gait assistance: Min guard Ambulation Distance (Feet): 120 Feet Assistive device: Rolling walker (2 wheeled) Gait Pattern/deviations: Step-to pattern;Decreased stride length;Antalgic Gait velocity: decreased   General Gait Details: cues for posture and sequencing   Stairs            Wheelchair Mobility    Modified Rankin (Stroke Patients Only)       Balance                                    Cognition Arousal/Alertness: Awake/alert Behavior During Therapy: WFL for tasks assessed/performed Overall Cognitive Status: Within Functional Limits for tasks assessed                      Exercises      General Comments        Pertinent Vitals/Pain 5/10    Home Living                      Prior Function            PT Goals (current goals can now be found in the care plan section) Progress towards PT  goals: Progressing toward goals    Frequency  7X/week    PT Plan Current plan remains appropriate    Co-evaluation             End of Session Equipment Utilized During Treatment: Gait belt;Right knee immobilizer Activity Tolerance: Patient limited by fatigue Patient left: in bed;in CPM;with call bell/phone within reach;with family/visitor present     Time: 1354-1420 PT Time Calculation (min): 26 min  Charges:  $Gait Training: 23-37 mins                    G Codes:      Lorriane Shire 01/07/2014, 2:49 PM  Lorrin Goodell, PT  Office # [phone] Pager [phone]

## 2014-01-08 LAB — URINALYSIS, ROUTINE W REFLEX MICROSCOPIC
Bilirubin Urine: NEGATIVE
Glucose, UA: >1000 mg/dL — AB
Hgb urine dipstick: NEGATIVE
KETONES UR: NEGATIVE mg/dL
Leukocytes, UA: NEGATIVE
NITRITE: NEGATIVE
Protein, ur: NEGATIVE mg/dL
Specific Gravity, Urine: 1.029 (ref 1.005–1.030)
UROBILINOGEN UA: 0.2 mg/dL (ref 0.0–1.0)
pH: 5 (ref 5.0–8.0)

## 2014-01-08 LAB — GLUCOSE, CAPILLARY
GLUCOSE-CAPILLARY: 227 mg/dL — AB (ref 70–99)
GLUCOSE-CAPILLARY: 242 mg/dL — AB (ref 70–99)
Glucose-Capillary: 202 mg/dL — ABNORMAL HIGH (ref 70–99)
Glucose-Capillary: 180 mg/dL — ABNORMAL HIGH (ref 70–99)

## 2014-01-08 LAB — URINE MICROSCOPIC-ADD ON

## 2014-01-08 LAB — PROTIME-INR
INR: 1.51 — ABNORMAL HIGH (ref 0.00–1.49)
PROTHROMBIN TIME: 17.8 s — AB (ref 11.6–15.2)

## 2014-01-08 MED ORDER — WARFARIN SODIUM 5 MG PO TABS
5.0000 mg | ORAL_TABLET | Freq: Once | ORAL | Status: AC
  Administered 2014-01-08: 5 mg via ORAL
  Filled 2014-01-08: qty 1

## 2014-01-08 NOTE — Progress Notes (Signed)
Pt places self on/off cpap machine. Pt's home equipment.

## 2014-01-08 NOTE — Progress Notes (Signed)
ANTICOAGULATION CONSULT NOTE - Follow Up Consult  Pharmacy Consult for coumadin Indication: VTE prophylaxis  Allergies  Allergen Reactions  . Lipitor [Atorvastatin] Other (See Comments)    Muscle pain    Patient Measurements: Height: 5' 10.5" (179.1 cm) (from 12/28/13 preadmission visit) Weight: 178 lb 12.7 oz (81.1 kg) (from 12/28/13 preadmission visit) IBW/kg (Calculated) : 74.15   Vital Signs: Temp: 98.9 F (37.2 C) (06/21 0615) Temp src: Oral (06/21 0615) BP: 119/60 mmHg (06/21 0615) Pulse Rate: 81 (06/21 0615)  Labs:  Recent Labs  01/07/14 0500 01/08/14 0414  LABPROT 14.4 17.8*  INR 1.14 1.51*    Estimated Creatinine Clearance: 46.4 ml/min (by C-G formula based on Cr of 1.6).  Assessment: Patienti s a 68 y.o M on coumadin for VTE prophylaxis s/p right TKA.  INR is trending up towards goal range with 1.51 today.  No bleeding documented.  Goal of Therapy:  INR 2-3    Plan:  1) coumadin 5mg  PO x1 today 2) see sticky note for metformin 3) home 6/22?  Dia Sitter P 01/08/2014,11:07 AM

## 2014-01-08 NOTE — Progress Notes (Signed)
Physical Therapy Treatment Patient Details Name: Patrick Miranda MRN: [id number] DOB: 1945-10-05 Today's Date: 01/08/2014    History of Present Illness pt presents with R TKA and hx L TKA in October 2014.      PT Comments    Assisted patient with there-ex, patient demonstrates limited range and mobility today.  Educated patient on importance of end range motions and normalization of gait pattern. Patient performed some ambulation but was limited by pain. Will continue to work with patient and progress activity as tolerated.   Follow Up Recommendations  Home health PT;Supervision - Intermittent     Equipment Recommendations  None recommended by PT    Recommendations for Other Services       Precautions / Restrictions Precautions Precautions: Fall;Knee Precaution Booklet Issued: Yes (comment) Required Braces or Orthoses: Knee Immobilizer - Right Knee Immobilizer - Right: On when out of bed or walking;Discontinue once straight leg raise with < 10 degree lag Restrictions Weight Bearing Restrictions: Yes RLE Weight Bearing: Weight bearing as tolerated    Mobility  Bed Mobility Overal bed mobility: Needs Assistance Bed Mobility: Sit to Supine     Supine to sit: Min assist Sit to supine: Min assist   General bed mobility comments: assist with RLE  Transfers Overall transfer level: Needs assistance Equipment used: Rolling walker (2 wheeled) Transfers: Sit to/from [facility] Sit to Stand: Min assist Stand pivot transfers: Min assist       General transfer comment: verbal cues for hand placement, sequencing  Ambulation/Gait Ambulation/Gait assistance: Min guard Ambulation Distance (Feet): 40 Feet Assistive device: Rolling walker (2 wheeled) Gait Pattern/deviations: Step-to pattern;Decreased stride length;Antalgic Gait velocity: decreased   General Gait Details: VCs for increased stride and step through stride, patient very limited with ambulation  this am.   Stairs            Wheelchair Mobility    Modified Rankin (Stroke Patients Only)       Balance     Sitting balance-Leahy Scale: Fair       Standing balance-Leahy Scale: Poor                      Cognition Arousal/Alertness: Awake/alert Behavior During Therapy: WFL for tasks assessed/performed Overall Cognitive Status: Within Functional Limits for tasks assessed                      Exercises Total Joint Exercises Ankle Circles/Pumps: AROM;Both;10 reps Quad Sets: AROM;Right;10 reps Straight Leg Raises: AROM;Right;10 reps Long Arc Quad: AROM;Right;10 reps (significant quad lag) Knee Flexion: AROM;Right;10 reps Goniometric ROM: AROM;Right;10 reps (PROM perform to produce more range)    General Comments        Pertinent Vitals/Pain 6/10    Home Living                      Prior Function            PT Goals (current goals can now be found in the care plan section) Acute Rehab PT Goals Patient Stated Goal: Walk normal PT Goal Formulation: With patient Time For Goal Achievement: 01/13/14 Potential to Achieve Goals: Good Progress towards PT goals: Progressing toward goals    Frequency  7X/week    PT Plan Current plan remains appropriate    Co-evaluation             End of Session Equipment Utilized During Treatment: Gait belt;Right knee immobilizer Activity Tolerance: Patient limited  by fatigue Patient left: in chair;with call bell/phone within reach;with family/visitor present     Time: (7473-4037) then returned )0964-3838) PT Time Calculation (min): 40 min total time  Charges:  $Gait Training: 8-22 mins $Therapeutic Exercise: 8-22 mins $Therapeutic Activity: 8-22 mins                    G CodesDuncan Miranda 01-23-2014, 10:08 AM Alben Deeds, PT DPT  [phone]

## 2014-01-08 NOTE — Progress Notes (Signed)
    Subjective: 2 Days Post-Op Procedure(s) (LRB): RIGHT TOTAL KNEE ARTHROPLASTY (Right) Patient reports pain as 3 on 0-10 scale.   Denies CP or SOB.  Voiding without difficulty. Positive flatus. Objective: Vital signs in last 24 hours: Temp:  [98.9 F (37.2 C)-100 F (37.8 C)] 98.9 F (37.2 C) (06/21 0615) Pulse Rate:  [79-82] 81 (06/21 0615) Resp:  [16-20] 16 (06/21 0615) BP: (106-135)/(54-71) 119/60 mmHg (06/21 0615) SpO2:  [93 %-100 %] 100 % (06/21 0615)  Intake/Output from previous day: 06/20 0701 - 06/21 0700 In: 1680 [P.O.:780; I.V.:900] Out: 1635 [Urine:1635] Intake/Output this shift:    Labs: No results found for this basename: HGB,  in the last 72 hours No results found for this basename: WBC, RBC, HCT, PLT,  in the last 72 hours No results found for this basename: NA, K, CL, CO2, BUN, CREATININE, GLUCOSE, CALCIUM,  in the last 72 hours  Recent Labs  01/07/14 0500 01/08/14 0414  INR 1.14 1.51*    Physical Exam: Neurologically intact ABD soft Intact pulses distally Incision: dressing C/D/I No cellulitis present Compartment soft  Assessment/Plan: 2 Days Post-Op Procedure(s) (LRB): RIGHT TOTAL KNEE ARTHROPLASTY (Right) Advance diet Up with therapy Plan for discharge tomorrow Voiding improved on flomax.  Will monitor  BROOKS,DAHARI D for Dr. Melina Schools [facility] [phone] 01/08/2014, 7:33 AM

## 2014-01-08 NOTE — Progress Notes (Signed)
Pt has been having difficulty urinating and urinary incontinence throughout the evening. While walking to the bathroom, he was urinating and was unable to stop. Bladder scan showed 300cc post void. PA [place] notified and flomax ordered and given. Pt has had several incontinent urine episodes and felt relief after voiding 450 cc.

## 2014-01-08 NOTE — Progress Notes (Addendum)
Physical Therapy Treatment Patient Details Name: Patrick Miranda MRN: [id number] DOB: 1945/08/10 Today's Date: 01/08/2014    History of Present Illness pt presents with R TKA and hx L TKA in October 2014.      PT Comments    Patient continues to demonstrate increased deficits in mobility and RLE strength at this time. Spoke with patient and family at length regarding concerns for mobility. Wife stated that she attempted to assist patient from bedside commode to bed this am and patient leg almost "gave out" educated patient and spouse on safety and need for assist.  Additionally spoke to patient and spouse regarding evident R quad weakness and SLR lag. explained that at this time, patient id limited by weakness in his right quads and that we will continue to work with patient regarding improvements in RLE strength. Instructed patient and family to continue with LAQ exercises as well as quad set with heel elevated and SLR when supine in bed. Wife very appreciative for information provided. At this time do not feel patient is safe for mobility with family and instructed family to call for staff when patient needs to mobilize.  Will continue to see and progress activity as tolerated.    Follow Up Recommendations  Home health PT;Supervision - Intermittent     Equipment Recommendations  None recommended by PT    Recommendations for Other Services       Precautions / Restrictions Precautions Precautions: Fall;Knee Precaution Booklet Issued: Yes (comment) Required Braces or Orthoses: Knee Immobilizer - Right Knee Immobilizer - Right: On when out of bed or walking;Discontinue once straight leg raise with < 10 degree lag Restrictions Weight Bearing Restrictions: Yes RLE Weight Bearing: Weight bearing as tolerated    Mobility  Bed Mobility Overal bed mobility: Needs Assistance Bed Mobility: Sit to Supine     Supine to sit: Min assist Sit to supine: Min assist   General bed mobility  comments: assist with RLE, assist to elevate RLE out of CPM maching and to move to EOB, cued for self supoorting technique using opposing limb.  Transfers Overall transfer level: Needs assistance Equipment used: Rolling walker (2 wheeled) Transfers: Sit to/from Stand Sit to Stand: Min assist Stand pivot transfers: Min assist       General transfer comment: VCs for hand placement, advised patient to attempt sitting transfers without kicking out RLE to faciliate increased knee flexion.  Ambulation/Gait Ambulation/Gait assistance: Min guard;Min assist Ambulation Distance (Feet): 140 Feet Assistive device: Rolling walker (2 wheeled) Gait Pattern/deviations: Step-to pattern;Decreased stance time - right;Decreased stride length;Decreased weight shift to right;Antalgic Gait velocity: decreased   General Gait Details: Patient very limited with weight bearing on RLE (weakened Quad) patient with heavy reliance on RW and decreased advancement of opposing (left) LE.  Max cues for step through gait with increased step length on the left. Patient required several standing rest breaks secodnary to pain and fatigue. Instability noted with ambulation.   Stairs            Wheelchair Mobility    Modified Rankin (Stroke Patients Only)       Balance     Sitting balance-Leahy Scale: Fair       Standing balance-Leahy Scale: Poor                      Cognition Arousal/Alertness: Awake/alert Behavior During Therapy: WFL for tasks assessed/performed Overall Cognitive Status: Within Functional Limits for tasks assessed  Exercises      General Comments General comments (skin integrity, edema, etc.): spoke with patient and family at length regarding concerns for mobility. Wife stated that she attempted to assist patient from bedside commode to bed this am and patient leg almost "gave out" educated patient and spouse on safety and need for assist.   Additionally spoke to patient and spouse regarding evident R quad weakness and SLR lag. explained that at this time, patient id limited by weakness in his right quads and that we will continue to work with patient regarding improvements in RLE strength. Instructed patient and family to continue with LAQ exercises as well as quad set with heel elevated and SLR when supine in bed. Wife very appreciative for information provided. At this time do not feel patient is safe for mobility with family and instructed family to call for staff when patient needs to mobilize.  Will continue to see and progress activity as tolerated.      Pertinent Vitals/Pain 6/10 (pre-medicated)    Home Living                      Prior Function            PT Goals (current goals can now be found in the care plan section) Acute Rehab PT Goals Patient Stated Goal: Walk normal PT Goal Formulation: With patient Time For Goal Achievement: 01/13/14 Potential to Achieve Goals: Good Progress towards PT goals: Progressing toward goals    Frequency  7X/week    PT Plan Current plan remains appropriate    Co-evaluation             End of Session Equipment Utilized During Treatment: Gait belt Activity Tolerance: Patient limited by fatigue Patient left: in chair;with call bell/phone within reach;with family/visitor present     Time: 6948-5462 PT Time Calculation (min): 25 min  Charges:  $Gait Training: 8-22 mins $Self Care/Home Management: 8-22                    G CodesDuncan Dull 01-10-14, 1:39 PM Alben Deeds, [place] DPT  [phone]

## 2014-01-09 ENCOUNTER — Encounter (HOSPITAL_COMMUNITY): Payer: Self-pay | Admitting: Orthopedic Surgery

## 2014-01-09 LAB — GLUCOSE, CAPILLARY
GLUCOSE-CAPILLARY: 168 mg/dL — AB (ref 70–99)
GLUCOSE-CAPILLARY: 247 mg/dL — AB (ref 70–99)

## 2014-01-09 LAB — PROTIME-INR
INR: 2 — ABNORMAL HIGH (ref 0.00–1.49)
Prothrombin Time: 22.1 seconds — ABNORMAL HIGH (ref 11.6–15.2)

## 2014-01-09 MED ORDER — CIPROFLOXACIN HCL 500 MG PO TABS: 500.0000 mg | ORAL_TABLET | Freq: Two times a day (BID) | ORAL | Status: DC

## 2014-01-09 NOTE — Progress Notes (Signed)
Physical Therapy Treatment Patient Details Name: Patrick Miranda MRN: [id number] DOB: Sep 07, 1945 Today's Date: 01/09/2014    History of Present Illness pt presents with R TKA and hx L TKA in October 2014.      PT Comments    Pt moving better today, though continues to have weakness in R quad.  Pt and wife demo good safety amb together and performing single step together.  Pt ready for D/C from PT stand point.    Follow Up Recommendations  Home health PT;Supervision - Intermittent     Equipment Recommendations  None recommended by PT    Recommendations for Other Services       Precautions / Restrictions Precautions Precautions: Fall;Knee Required Braces or Orthoses: Knee Immobilizer - Right Knee Immobilizer - Right: On when out of bed or walking;Discontinue once straight leg raise with < 10 degree lag Restrictions Weight Bearing Restrictions: Yes RLE Weight Bearing: Weight bearing as tolerated    Mobility  Bed Mobility                  Transfers Overall transfer level: Needs assistance Equipment used: Rolling walker (2 wheeled) Transfers: Sit to/from Stand Sit to Stand: Min guard         General transfer comment: Demos good use of UEs.    Ambulation/Gait Ambulation/Gait assistance: Min guard Ambulation Distance (Feet): 180 Feet Assistive device: Rolling walker (2 wheeled) Gait Pattern/deviations: Step-to pattern;Decreased step length - left;Decreased stance time - right     General Gait Details: pt continues to rely on RW.  Cues for upright posture.     Stairs Stairs: Yes Stairs assistance: Min assist Stair Management: No rails;Forwards;With walker Number of Stairs: 1 (x2) General stair comments: cues for safe technique with use of RW.  pt performed first time with PT and then pt and wife returned demonstration.    Wheelchair Mobility    Modified Rankin (Stroke Patients Only)       Balance                                     Cognition Arousal/Alertness: Awake/alert Behavior During Therapy: WFL for tasks assessed/performed Overall Cognitive Status: Within Functional Limits for tasks assessed                      Exercises Total Joint Exercises Ankle Circles/Pumps: AROM;Both;10 reps Quad Sets: AROM;Right;10 reps Long Arc Quad: AAROM;Right;10 reps Knee Flexion: AAROM;Right;10 reps Goniometric ROM: AAROM 5 - 70 in sitting    General Comments        Pertinent Vitals/Pain "Stiffness".  Premedicated.      Home Living                      Prior Function            PT Goals (current goals can now be found in the care plan section) Acute Rehab PT Goals Time For Goal Achievement: 01/13/14 Potential to Achieve Goals: Good Progress towards PT goals: Progressing toward goals    Frequency  7X/week    PT Plan Current plan remains appropriate    Co-evaluation             End of Session Equipment Utilized During Treatment: Gait belt;Right knee immobilizer Activity Tolerance: Patient tolerated treatment well Patient left: in chair;with call bell/phone within reach;with family/visitor present     Time: 5456-2563 PT  Time Calculation (min): 37 min  Charges:  $Gait Training: 8-22 mins $Therapeutic Exercise: 8-22 mins                    G CodesCatarina Miranda, [place] 01/09/2014, 2:25 PM

## 2014-01-09 NOTE — Progress Notes (Signed)
Pt continues to have episodes of incontinence requiring the use of a brief. He is voiding well and no longer has difficulty starting urination. Urine does have a strong odor. U/A sent 6/21 shows triple phospate crystals.

## 2014-01-09 NOTE — Discharge Summary (Signed)
Physician Discharge Summary   Patient ID: Patrick Miranda MRN: [id number] DOB/AGE: 1946-04-16 68 y.o.  Admit date: 01/06/2014 Discharge date: 01/09/2014  Admission Diagnoses:  Active Problems:   Arthritis of knee, right   Discharge Diagnoses:  Same   Surgeries: Procedure(s): RIGHT TOTAL KNEE ARTHROPLASTY on 01/06/2014   Consultants: PT/OT  Discharged Condition: Stable  Hospital Course: Patrick Miranda is an 68 y.o. male who was admitted 01/06/2014 with a chief complaint of No chief complaint on file. , and found to have a diagnosis of <principal problem not specified>.  They were brought to the operating room on 01/06/2014 and underwent the above named procedures.    The patient had an uncomplicated hospital course and was stable for discharge.  Recent vital signs:  Filed Vitals:   01/09/14 0556  BP: 104/63  Pulse: 69  Temp: 98.3 F (36.8 C)  Resp: 16    Recent laboratory studies:  Results for orders placed during the hospital encounter of 01/06/14  GLUCOSE, CAPILLARY      Result Value Ref Range   Glucose-Capillary 185 (*) 70 - 99 mg/dL  GLUCOSE, CAPILLARY      Result Value Ref Range   Glucose-Capillary 136 (*) 70 - 99 mg/dL   Comment 1 Notify RN    GLUCOSE, CAPILLARY      Result Value Ref Range   Glucose-Capillary 165 (*) 70 - 99 mg/dL   Comment 1 Notify RN     Comment 2 Documented in Chart    PROTIME-INR      Result Value Ref Range   Prothrombin Time 14.4  11.6 - 15.2 seconds   INR 1.14  0.00 - 1.49  GLUCOSE, CAPILLARY      Result Value Ref Range   Glucose-Capillary 177 (*) 70 - 99 mg/dL  GLUCOSE, CAPILLARY      Result Value Ref Range   Glucose-Capillary 175 (*) 70 - 99 mg/dL   Comment 1 Notify RN    GLUCOSE, CAPILLARY      Result Value Ref Range   Glucose-Capillary 180 (*) 70 - 99 mg/dL  GLUCOSE, CAPILLARY      Result Value Ref Range   Glucose-Capillary 176 (*) 70 - 99 mg/dL  PROTIME-INR      Result Value Ref Range   Prothrombin Time 17.8  (*) 11.6 - 15.2 seconds   INR 1.51 (*) 0.00 - 1.49  GLUCOSE, CAPILLARY      Result Value Ref Range   Glucose-Capillary 190 (*) 70 - 99 mg/dL   Comment 1 Notify RN    GLUCOSE, CAPILLARY      Result Value Ref Range   Glucose-Capillary 202 (*) 70 - 99 mg/dL   Comment 1 Notify RN    GLUCOSE, CAPILLARY      Result Value Ref Range   Glucose-Capillary 227 (*) 70 - 99 mg/dL   Comment 1 Documented in Chart     Comment 2 Notify RN    URINALYSIS, ROUTINE W REFLEX MICROSCOPIC      Result Value Ref Range   Color, Urine YELLOW  YELLOW   APPearance CLOUDY (*) CLEAR   Specific Gravity, Urine 1.029  1.005 - 1.030   pH 5.0  5.0 - 8.0   Glucose, UA >1000 (*) NEGATIVE mg/dL   Hgb urine dipstick NEGATIVE  NEGATIVE   Bilirubin Urine NEGATIVE  NEGATIVE   Ketones, ur NEGATIVE  NEGATIVE mg/dL   Protein, ur NEGATIVE  NEGATIVE mg/dL   Urobilinogen, UA 0.2  0.0 - 1.0 mg/dL  Nitrite NEGATIVE  NEGATIVE   Leukocytes, UA NEGATIVE  NEGATIVE  GLUCOSE, CAPILLARY      Result Value Ref Range   Glucose-Capillary 242 (*) 70 - 99 mg/dL   Comment 1 Notify RN     Comment 2 Documented in Chart    URINE MICROSCOPIC-ADD ON      Result Value Ref Range   Squamous Epithelial / LPF RARE  RARE   Crystals TRIPLE PHOSPHATE CRYSTALS (*) NEGATIVE  PROTIME-INR      Result Value Ref Range   Prothrombin Time 22.1 (*) 11.6 - 15.2 seconds   INR 2.00 (*) 0.00 - 1.49  GLUCOSE, CAPILLARY      Result Value Ref Range   Glucose-Capillary 180 (*) 70 - 99 mg/dL  GLUCOSE, CAPILLARY      Result Value Ref Range   Glucose-Capillary 168 (*) 70 - 99 mg/dL    Discharge Medications:     Medication List         carvedilol 12.5 MG tablet  Commonly known as:  COREG  Take 12.5 mg by mouth 2 (two) times daily with a meal.     ciprofloxacin 500 MG tablet  Commonly known as:  CIPRO  Take 1 tablet (500 mg total) by mouth 2 (two) times daily.     clopidogrel 75 MG tablet  Commonly known as:  PLAVIX  Take 75 mg by mouth daily with  breakfast.     fenofibrate 160 MG tablet  Take 160 mg by mouth daily.     ferrous sulfate 325 (65 FE) MG tablet  Take 1 tablet (325 mg total) by mouth 3 (three) times daily after meals.     fluticasone 50 MCG/ACT nasal spray  Commonly known as:  FLONASE  Place 1 spray into both nostrils daily as needed for allergies or rhinitis.     glyBURIDE-metformin 5-500 MG per tablet  Commonly known as:  GLUCOVANCE  Take 2 tablets by mouth 2 (two) times daily with a meal.     insulin detemir 100 UNIT/ML injection  Commonly known as:  LEVEMIR  Inject 10 Units into the skin daily. Takes at bedtime     INVOKANA 100 MG Tabs  Generic drug:  Canagliflozin  Take 100 mg by mouth daily.     losartan-hydrochlorothiazide 100-25 MG per tablet  Commonly known as:  HYZAAR  Take 1 tablet by mouth at bedtime.     methocarbamol 500 MG tablet  Commonly known as:  ROBAXIN  Take 1 tablet (500 mg total) by mouth 3 (three) times daily as needed for muscle spasms.     oxyCODONE-acetaminophen 5-325 MG per tablet  Commonly known as:  ROXICET  Take 1-2 tablets by mouth every 4 (four) hours as needed for severe pain.     triamcinolone cream 0.1 %  Commonly known as:  KENALOG  Apply 1 application topically 2 (two) times daily.     warfarin 5 MG tablet  Commonly known as:  COUMADIN  Take 1 tablet (5 mg total) by mouth daily.        Diagnostic Studies: Dg Knee Right Port  01/06/2014   CLINICAL DATA:  Status post arthroplasty  EXAM: PORTABLE RIGHT KNEE - 1-2 VIEW  COMPARISON:  None.  FINDINGS: Frontal and lateral views were obtained. The patient is status post knee arthroplasty with femoral and tibial components appearing well-seated. There is no fracture or dislocation. Air is seen within the knee joint, an expected postoperative finding.  IMPRESSION: Prosthetic components appear well seated.  No fracture or dislocation.   Electronically Signed   By: Lowella Grip M.D.   On: 01/06/2014 10:49     Disposition: 06-Home-Health Care Svc      Discharge Instructions   Call MD / Call 911    Complete by:  As directed   If you experience chest pain or shortness of breath, CALL 911 and be transported to the hospital emergency room.  If you develope a fever above 101 F, pus (white drainage) or increased drainage or redness at the wound, or calf pain, call your surgeon's office.     Constipation Prevention    Complete by:  As directed   Drink plenty of fluids.  Prune juice may be helpful.  You may use a stool softener, such as Colace (over the counter) 100 mg twice a day.  Use MiraLax (over the counter) for constipation as needed.     Diet - low sodium heart healthy    Complete by:  As directed      Increase activity slowly as tolerated    Complete by:  As directed            Follow-up Information   Follow up with NORRIS,STEVEN R, MD. Call in 2 weeks. (Follow up with Merla Riches, PA-C in the office in two weeks)    Specialty:  Orthopedic Surgery   Contact information:   [address] [place] [address] [phone]        Signed: Ventura Bruns 01/09/2014, 10:14 AM

## 2014-01-09 NOTE — Progress Notes (Signed)
Orthopedics Progress Note  Subjective: PAtient feeling better today  Objective:  Filed Vitals:   01/09/14 0556  BP: 104/63  Pulse: 69  Temp: 98.3 F (36.8 C)  Resp: 16    General: Awake and alert  Musculoskeletal: right knee dressing intact wound benign Neurovascularly intact  Lab Results  Component Value Date   WBC 5.2 12/28/2013   HGB 12.1* 12/28/2013   HCT 36.4* 12/28/2013   MCV 92.2 12/28/2013   PLT 333 12/28/2013       Component Value Date/Time   NA 142 12/28/2013 0917   K 4.1 12/28/2013 0917   CL 104 12/28/2013 0917   CO2 23 12/28/2013 0917   GLUCOSE 166* 12/28/2013 0917   BUN 24* 12/28/2013 0917   CREATININE 1.60* 12/28/2013 0917   CALCIUM 9.4 12/28/2013 0917   GFRNONAA 43* 12/28/2013 0917   GFRAA 49* 12/28/2013 0917    Lab Results  Component Value Date   INR 2.00* 01/09/2014   INR 1.51* 01/08/2014   INR 1.14 01/07/2014    Assessment/Plan: POD #3 s/p Procedure(s): RIGHT TOTAL KNEE ARTHROPLASTY INR therapeutic D/C after cleared by therapy  Remo Lipps R. Veverly Fells, MD 01/09/2014 7:50 AM

## 2014-01-10 LAB — URINE CULTURE
Colony Count: NO GROWTH
Culture: NO GROWTH

## 2014-01-11 DIAGNOSIS — Z96659 Presence of unspecified artificial knee joint: Secondary | ICD-10-CM | POA: Diagnosis not present

## 2014-01-11 DIAGNOSIS — E785 Hyperlipidemia, unspecified: Secondary | ICD-10-CM | POA: Diagnosis not present

## 2014-01-11 DIAGNOSIS — G4733 Obstructive sleep apnea (adult) (pediatric): Secondary | ICD-10-CM | POA: Diagnosis not present

## 2014-01-11 DIAGNOSIS — E119 Type 2 diabetes mellitus without complications: Secondary | ICD-10-CM | POA: Diagnosis not present

## 2014-01-11 DIAGNOSIS — Z471 Aftercare following joint replacement surgery: Secondary | ICD-10-CM | POA: Diagnosis not present

## 2014-01-11 DIAGNOSIS — I10 Essential (primary) hypertension: Secondary | ICD-10-CM | POA: Diagnosis not present

## 2014-01-12 DIAGNOSIS — Z471 Aftercare following joint replacement surgery: Secondary | ICD-10-CM | POA: Diagnosis not present

## 2014-01-12 DIAGNOSIS — Z96659 Presence of unspecified artificial knee joint: Secondary | ICD-10-CM | POA: Diagnosis not present

## 2014-01-12 DIAGNOSIS — I10 Essential (primary) hypertension: Secondary | ICD-10-CM | POA: Diagnosis not present

## 2014-01-12 DIAGNOSIS — E785 Hyperlipidemia, unspecified: Secondary | ICD-10-CM | POA: Diagnosis not present

## 2014-01-12 DIAGNOSIS — G4733 Obstructive sleep apnea (adult) (pediatric): Secondary | ICD-10-CM | POA: Diagnosis not present

## 2014-01-12 DIAGNOSIS — E119 Type 2 diabetes mellitus without complications: Secondary | ICD-10-CM | POA: Diagnosis not present

## 2014-01-13 DIAGNOSIS — Z96659 Presence of unspecified artificial knee joint: Secondary | ICD-10-CM | POA: Diagnosis not present

## 2014-01-13 DIAGNOSIS — E785 Hyperlipidemia, unspecified: Secondary | ICD-10-CM | POA: Diagnosis not present

## 2014-01-13 DIAGNOSIS — I10 Essential (primary) hypertension: Secondary | ICD-10-CM | POA: Diagnosis not present

## 2014-01-13 DIAGNOSIS — G4733 Obstructive sleep apnea (adult) (pediatric): Secondary | ICD-10-CM | POA: Diagnosis not present

## 2014-01-13 DIAGNOSIS — E119 Type 2 diabetes mellitus without complications: Secondary | ICD-10-CM | POA: Diagnosis not present

## 2014-01-13 DIAGNOSIS — Z471 Aftercare following joint replacement surgery: Secondary | ICD-10-CM | POA: Diagnosis not present

## 2014-01-16 DIAGNOSIS — Z96659 Presence of unspecified artificial knee joint: Secondary | ICD-10-CM | POA: Diagnosis not present

## 2014-01-16 DIAGNOSIS — E119 Type 2 diabetes mellitus without complications: Secondary | ICD-10-CM | POA: Diagnosis not present

## 2014-01-16 DIAGNOSIS — I10 Essential (primary) hypertension: Secondary | ICD-10-CM | POA: Diagnosis not present

## 2014-01-16 DIAGNOSIS — Z471 Aftercare following joint replacement surgery: Secondary | ICD-10-CM | POA: Diagnosis not present

## 2014-01-16 DIAGNOSIS — G4733 Obstructive sleep apnea (adult) (pediatric): Secondary | ICD-10-CM | POA: Diagnosis not present

## 2014-01-16 DIAGNOSIS — E785 Hyperlipidemia, unspecified: Secondary | ICD-10-CM | POA: Diagnosis not present

## 2014-01-18 DIAGNOSIS — G4733 Obstructive sleep apnea (adult) (pediatric): Secondary | ICD-10-CM | POA: Diagnosis not present

## 2014-01-18 DIAGNOSIS — Z471 Aftercare following joint replacement surgery: Secondary | ICD-10-CM | POA: Diagnosis not present

## 2014-01-18 DIAGNOSIS — E785 Hyperlipidemia, unspecified: Secondary | ICD-10-CM | POA: Diagnosis not present

## 2014-01-18 DIAGNOSIS — E119 Type 2 diabetes mellitus without complications: Secondary | ICD-10-CM | POA: Diagnosis not present

## 2014-01-18 DIAGNOSIS — Z96659 Presence of unspecified artificial knee joint: Secondary | ICD-10-CM | POA: Diagnosis not present

## 2014-01-18 DIAGNOSIS — I10 Essential (primary) hypertension: Secondary | ICD-10-CM | POA: Diagnosis not present

## 2014-01-19 DIAGNOSIS — I10 Essential (primary) hypertension: Secondary | ICD-10-CM | POA: Diagnosis not present

## 2014-01-19 DIAGNOSIS — E785 Hyperlipidemia, unspecified: Secondary | ICD-10-CM | POA: Diagnosis not present

## 2014-01-19 DIAGNOSIS — Z471 Aftercare following joint replacement surgery: Secondary | ICD-10-CM | POA: Diagnosis not present

## 2014-01-19 DIAGNOSIS — E119 Type 2 diabetes mellitus without complications: Secondary | ICD-10-CM | POA: Diagnosis not present

## 2014-01-19 DIAGNOSIS — Z4789 Encounter for other orthopedic aftercare: Secondary | ICD-10-CM | POA: Diagnosis not present

## 2014-01-19 DIAGNOSIS — G4733 Obstructive sleep apnea (adult) (pediatric): Secondary | ICD-10-CM | POA: Diagnosis not present

## 2014-01-19 DIAGNOSIS — Z96659 Presence of unspecified artificial knee joint: Secondary | ICD-10-CM | POA: Diagnosis not present

## 2014-01-23 DIAGNOSIS — I10 Essential (primary) hypertension: Secondary | ICD-10-CM | POA: Diagnosis not present

## 2014-01-23 DIAGNOSIS — G4733 Obstructive sleep apnea (adult) (pediatric): Secondary | ICD-10-CM | POA: Diagnosis not present

## 2014-01-23 DIAGNOSIS — Z96659 Presence of unspecified artificial knee joint: Secondary | ICD-10-CM | POA: Diagnosis not present

## 2014-01-23 DIAGNOSIS — E119 Type 2 diabetes mellitus without complications: Secondary | ICD-10-CM | POA: Diagnosis not present

## 2014-01-23 DIAGNOSIS — E785 Hyperlipidemia, unspecified: Secondary | ICD-10-CM | POA: Diagnosis not present

## 2014-01-23 DIAGNOSIS — Z471 Aftercare following joint replacement surgery: Secondary | ICD-10-CM | POA: Diagnosis not present

## 2014-01-24 DIAGNOSIS — Z96659 Presence of unspecified artificial knee joint: Secondary | ICD-10-CM | POA: Diagnosis not present

## 2014-01-24 DIAGNOSIS — I10 Essential (primary) hypertension: Secondary | ICD-10-CM | POA: Diagnosis not present

## 2014-01-24 DIAGNOSIS — E119 Type 2 diabetes mellitus without complications: Secondary | ICD-10-CM | POA: Diagnosis not present

## 2014-01-24 DIAGNOSIS — Z471 Aftercare following joint replacement surgery: Secondary | ICD-10-CM | POA: Diagnosis not present

## 2014-01-24 DIAGNOSIS — E785 Hyperlipidemia, unspecified: Secondary | ICD-10-CM | POA: Diagnosis not present

## 2014-01-24 DIAGNOSIS — G4733 Obstructive sleep apnea (adult) (pediatric): Secondary | ICD-10-CM | POA: Diagnosis not present

## 2014-01-25 DIAGNOSIS — M129 Arthropathy, unspecified: Secondary | ICD-10-CM | POA: Diagnosis not present

## 2014-01-26 DIAGNOSIS — M129 Arthropathy, unspecified: Secondary | ICD-10-CM | POA: Diagnosis not present

## 2014-01-31 DIAGNOSIS — M129 Arthropathy, unspecified: Secondary | ICD-10-CM | POA: Diagnosis not present

## 2014-02-02 DIAGNOSIS — M129 Arthropathy, unspecified: Secondary | ICD-10-CM | POA: Diagnosis not present

## 2014-02-07 DIAGNOSIS — M129 Arthropathy, unspecified: Secondary | ICD-10-CM | POA: Diagnosis not present

## 2014-02-09 DIAGNOSIS — M129 Arthropathy, unspecified: Secondary | ICD-10-CM | POA: Diagnosis not present

## 2014-02-14 DIAGNOSIS — I10 Essential (primary) hypertension: Secondary | ICD-10-CM | POA: Diagnosis not present

## 2014-02-14 DIAGNOSIS — M129 Arthropathy, unspecified: Secondary | ICD-10-CM | POA: Diagnosis not present

## 2014-02-14 DIAGNOSIS — N183 Chronic kidney disease, stage 3 unspecified: Secondary | ICD-10-CM | POA: Diagnosis not present

## 2014-02-14 DIAGNOSIS — E1129 Type 2 diabetes mellitus with other diabetic kidney complication: Secondary | ICD-10-CM | POA: Diagnosis not present

## 2014-02-16 DIAGNOSIS — M25569 Pain in unspecified knee: Secondary | ICD-10-CM | POA: Diagnosis not present

## 2014-02-21 DIAGNOSIS — M25569 Pain in unspecified knee: Secondary | ICD-10-CM | POA: Diagnosis not present

## 2014-02-22 DIAGNOSIS — Z96659 Presence of unspecified artificial knee joint: Secondary | ICD-10-CM | POA: Diagnosis not present

## 2014-02-23 DIAGNOSIS — M25569 Pain in unspecified knee: Secondary | ICD-10-CM | POA: Diagnosis not present

## 2014-03-02 DIAGNOSIS — M25569 Pain in unspecified knee: Secondary | ICD-10-CM | POA: Diagnosis not present

## 2014-03-07 DIAGNOSIS — M25569 Pain in unspecified knee: Secondary | ICD-10-CM | POA: Diagnosis not present

## 2014-03-09 ENCOUNTER — Ambulatory Visit (INDEPENDENT_AMBULATORY_CARE_PROVIDER_SITE_OTHER): Payer: Medicare Other | Admitting: Podiatry

## 2014-03-09 ENCOUNTER — Encounter: Payer: Self-pay | Admitting: Podiatry

## 2014-03-09 DIAGNOSIS — B351 Tinea unguium: Secondary | ICD-10-CM | POA: Diagnosis not present

## 2014-03-09 DIAGNOSIS — M79673 Pain in unspecified foot: Secondary | ICD-10-CM

## 2014-03-09 DIAGNOSIS — M79609 Pain in unspecified limb: Secondary | ICD-10-CM | POA: Diagnosis not present

## 2014-03-09 NOTE — Progress Notes (Signed)
Subjective:     Patient ID: Patrick Miranda, male   DOB: 03/10/1946, 68 y.o.   MRN: [id number]  HPI patient has thick painful nails 1-5 both feet that she cannot cut   Review of Systems     Objective:   Physical Exam Neurovascular status intact with thick yellow nailbeds 1-5 both feet that are painful    Assessment:     Mycotic nail infection with pain 1-5 both feet    Plan:     Debris painful nailbeds 1-5 both feet with no iatrogenic bleeding noted      

## 2014-03-09 NOTE — Patient Instructions (Signed)
Diabetes and Foot Care Diabetes may cause you to have problems because of poor blood supply (circulation) to your feet and legs. This may cause the skin on your feet to become thinner, break easier, and heal more slowly. Your skin may become dry, and the skin may peel and crack. You may also have nerve damage in your legs and feet causing decreased feeling in them. You may not notice minor injuries to your feet that could lead to infections or more serious problems. Taking care of your feet is one of the most important things you can do for yourself.  HOME CARE INSTRUCTIONS  Wear shoes at all times, even in the house. Do not go barefoot. Bare feet are easily injured.  Check your feet daily for blisters, cuts, and redness. If you cannot see the bottom of your feet, use a mirror or ask someone for help.  Wash your feet with warm water (do not use hot water) and mild soap. Then pat your feet and the areas between your toes until they are completely dry. Do not soak your feet as this can dry your skin.  Apply a moisturizing lotion or petroleum jelly (that does not contain alcohol and is unscented) to the skin on your feet and to dry, brittle toenails. Do not apply lotion between your toes.  Trim your toenails straight across. Do not dig under them or around the cuticle. File the edges of your nails with an emery board or nail file.  Do not cut corns or calluses or try to remove them with medicine.  Wear clean socks or stockings every day. Make sure they are not too tight. Do not wear knee-high stockings since they may decrease blood flow to your legs.  Wear shoes that fit properly and have enough cushioning. To break in new shoes, wear them for just a few hours a day. This prevents you from injuring your feet. Always look in your shoes before you put them on to be sure there are no objects inside.  Do not cross your legs. This may decrease the blood flow to your feet.  If you find a minor scrape,  cut, or break in the skin on your feet, keep it and the skin around it clean and dry. These areas may be cleansed with mild soap and water. Do not cleanse the area with peroxide, alcohol, or iodine.  When you remove an adhesive bandage, be sure not to damage the skin around it.  If you have a wound, look at it several times a day to make sure it is healing.  Do not use heating pads or hot water bottles. They may burn your skin. If you have lost feeling in your feet or legs, you may not know it is happening until it is too late.  Make sure your health care provider performs a complete foot exam at least annually or more often if you have foot problems. Report any cuts, sores, or bruises to your health care provider immediately. SEEK MEDICAL CARE IF:   You have an injury that is not healing.  You have cuts or breaks in the skin.  You have an ingrown nail.  You notice redness on your legs or feet.  You feel burning or tingling in your legs or feet.  You have pain or cramps in your legs and feet.  Your legs or feet are numb.  Your feet always feel cold. SEEK IMMEDIATE MEDICAL CARE IF:   There is increasing redness,   swelling, or pain in or around a wound.  There is a red line that goes up your leg.  Pus is coming from a wound.  You develop a fever or as directed by your health care provider.  You notice a bad smell coming from an ulcer or wound. Document Released: 07/04/2000 Document Revised: 03/09/2013 Document Reviewed: 12/14/2012 [employer] Patient Information 2015 [employer]. This information is not intended to replace advice given to you by your health care provider. Make sure you discuss any questions you have with your health care provider.  

## 2014-03-10 DIAGNOSIS — E785 Hyperlipidemia, unspecified: Secondary | ICD-10-CM | POA: Diagnosis not present

## 2014-03-10 DIAGNOSIS — Z125 Encounter for screening for malignant neoplasm of prostate: Secondary | ICD-10-CM | POA: Diagnosis not present

## 2014-03-10 DIAGNOSIS — M25569 Pain in unspecified knee: Secondary | ICD-10-CM | POA: Diagnosis not present

## 2014-03-10 DIAGNOSIS — I10 Essential (primary) hypertension: Secondary | ICD-10-CM | POA: Diagnosis not present

## 2014-03-10 DIAGNOSIS — E119 Type 2 diabetes mellitus without complications: Secondary | ICD-10-CM | POA: Diagnosis not present

## 2014-03-14 DIAGNOSIS — M25569 Pain in unspecified knee: Secondary | ICD-10-CM | POA: Diagnosis not present

## 2014-03-16 DIAGNOSIS — M25569 Pain in unspecified knee: Secondary | ICD-10-CM | POA: Diagnosis not present

## 2014-03-17 DIAGNOSIS — E1129 Type 2 diabetes mellitus with other diabetic kidney complication: Secondary | ICD-10-CM | POA: Diagnosis not present

## 2014-03-17 DIAGNOSIS — E11319 Type 2 diabetes mellitus with unspecified diabetic retinopathy without macular edema: Secondary | ICD-10-CM | POA: Diagnosis not present

## 2014-03-17 DIAGNOSIS — N183 Chronic kidney disease, stage 3 unspecified: Secondary | ICD-10-CM | POA: Diagnosis not present

## 2014-03-17 DIAGNOSIS — I10 Essential (primary) hypertension: Secondary | ICD-10-CM | POA: Diagnosis not present

## 2014-03-17 DIAGNOSIS — I6350 Cerebral infarction due to unspecified occlusion or stenosis of unspecified cerebral artery: Secondary | ICD-10-CM | POA: Diagnosis not present

## 2014-03-17 DIAGNOSIS — E1139 Type 2 diabetes mellitus with other diabetic ophthalmic complication: Secondary | ICD-10-CM | POA: Diagnosis not present

## 2014-03-17 DIAGNOSIS — E785 Hyperlipidemia, unspecified: Secondary | ICD-10-CM | POA: Diagnosis not present

## 2014-03-17 DIAGNOSIS — Z0000 Encounter for general adult medical examination without abnormal findings: Secondary | ICD-10-CM | POA: Diagnosis not present

## 2014-03-17 DIAGNOSIS — Z23 Encounter for immunization: Secondary | ICD-10-CM | POA: Diagnosis not present

## 2014-03-17 DIAGNOSIS — E1159 Type 2 diabetes mellitus with other circulatory complications: Secondary | ICD-10-CM | POA: Diagnosis not present

## 2014-03-20 DIAGNOSIS — Z1212 Encounter for screening for malignant neoplasm of rectum: Secondary | ICD-10-CM | POA: Diagnosis not present

## 2014-04-13 ENCOUNTER — Ambulatory Visit (INDEPENDENT_AMBULATORY_CARE_PROVIDER_SITE_OTHER): Payer: Self-pay | Admitting: Emergency Medicine

## 2014-04-13 VITALS — BP 136/88 | HR 72 | Temp 98.2°F | Resp 16 | Ht 70.5 in | Wt 180.8 lb

## 2014-04-13 DIAGNOSIS — Z0289 Encounter for other administrative examinations: Secondary | ICD-10-CM

## 2014-04-13 NOTE — Progress Notes (Signed)
[facility] [address] [place] [address] [phone]  Date:  04/13/2014   Name:  Patrick Miranda   DOB:  [birth year]   MRN:  [id number]  PCP:  Geoffery Lyons, MD    Chief Complaint: Employment Physical   History of Present Illness:  Patrick Miranda is a 68 y.o. very pleasant male patient who presents with the following:  DOT certification   Patient Active Problem List   Diagnosis Date Noted  . Arthritis of knee, right 01/06/2014  . Brainstem stroke 09/07/2011    Class: Acute  . Dysarthria 09/05/2011    Class: Acute  . Chronic kidney disease (CKD), stage II (mild) 09/05/2011    Class: Chronic  . Hypertension 09/05/2011    Class: Chronic  . DM (diabetes mellitus), type 2 09/05/2011    Class: Chronic  . Hyperlipidemia 09/05/2011    Class: Chronic  . Obstructive sleep apnea 09/05/2011    Class: Chronic  . Anemia 09/05/2011    Class: Chronic    Past Medical History  Diagnosis Date  . Diabetes mellitus   . Hypertension   . Arthritis   . Hyperlipidemia   . Sleep apnea     on cpap at home  . Cerebral artery disease   . Hernia, umbilical   . Depression   . 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    affected patients speech  . HOH (hard of hearing)     Past Surgical History  Procedure Laterality Date  . Total knee arthroplasty Left 04/22/2013    Procedure: TOTAL KNEE ARTHROPLASTY- left;  Surgeon: Augustin Schooling, MD;  Location: [place];  Service: Orthopedics;  Laterality: Left;  with femoral block  . Colonoscopy w/ polypectomy    . Total knee arthroplasty Right 01/06/2014    Procedure: RIGHT TOTAL KNEE ARTHROPLASTY;  Surgeon: Augustin Schooling, MD;  Location: [place];  Service: Orthopedics;  Laterality: Right;    History  Substance Use Topics  . Smoking status: Never Smoker   . Smokeless tobacco: Not on file  . Alcohol Use: No    History reviewed. No pertinent family history.  Allergies  Allergen Reactions  . Lipitor  [Atorvastatin] Other (See Comments)    Muscle pain    Medication list has been reviewed and updated.  Current Outpatient Prescriptions on File Prior to Visit  Medication Sig Dispense Refill  . carvedilol (COREG) 12.5 MG tablet Take 12.5 mg by mouth 2 (two) times daily with a meal.      . clopidogrel (PLAVIX) 75 MG tablet Take 75 mg by mouth daily with breakfast.      . glyBURIDE-metformin (GLUCOVANCE) 5-500 MG per tablet Take 2 tablets by mouth 2 (two) times daily with a meal.       . losartan-hydrochlorothiazide (HYZAAR) 100-25 MG per tablet Take 1 tablet by mouth at bedtime.       . Canagliflozin (INVOKANA) 100 MG TABS Take 100 mg by mouth daily.      . ciprofloxacin (CIPRO) 500 MG tablet Take 1 tablet (500 mg total) by mouth 2 (two) times daily.  6 tablet  0  . fenofibrate 160 MG tablet Take 160 mg by mouth daily.      . ferrous sulfate 325 (65 FE) MG tablet Take 1 tablet (325 mg total) by mouth 3 (three) times daily after meals.  30 tablet  0  . fluticasone (FLONASE) 50 MCG/ACT nasal spray Place 1 spray into both nostrils daily as needed  for allergies or rhinitis.      Marland Kitchen insulin detemir (LEVEMIR) 100 UNIT/ML injection Inject 10 Units into the skin daily. Takes at bedtime      . methocarbamol (ROBAXIN) 500 MG tablet Take 1 tablet (500 mg total) by mouth 3 (three) times daily as needed for muscle spasms.  60 tablet  1  . oxyCODONE-acetaminophen (ROXICET) 5-325 MG per tablet Take 1-2 tablets by mouth every 4 (four) hours as needed for severe pain.  60 tablet  0  . triamcinolone cream (KENALOG) 0.1 % Apply 1 application topically 2 (two) times daily.      Marland Kitchen warfarin (COUMADIN) 5 MG tablet Take 1 tablet (5 mg total) by mouth daily.  40 tablet  0   No current facility-administered medications on file prior to visit.    Review of Systems:  As per HPI, otherwise negative.    Physical Examination: Filed Vitals:   04/13/14 1338  BP: 136/88  Pulse: 72  Temp: 98.2 F (36.8 C)  Resp: 16    Filed Vitals:   04/13/14 1338  Height: 5' 10.5" (1.791 m)  Weight: 180 lb 12.8 oz (82.01 kg)   Body mass index is 25.57 kg/(m^2). Ideal Body Weight: Weight in (lb) to have BMI = 25: 176.4  GEN: WDWN, NAD, Non-toxic, A & O x 3 HEENT: Atraumatic, Normocephalic. Neck supple. No masses, No LAD. Ears and Nose: No external deformity. CV: RRR, No M/G/R. No JVD. No thrill. No extra heart sounds. PULM: CTA B, no wheezes, crackles, rhonchi. No retractions. No resp. distress. No accessory muscle use. ABD: S, NT, ND, +BS. No rebound. No HSM. EXTR: No c/c/e NEURO Normal gait.  PSYCH: Normally interactive. Conversant. Not depressed or anxious appearing.  Calm demeanor.    Assessment and Plan: Hypertension DM OSA Failed to disclose during exam  Signed,  Ellison Carwin, MD

## 2014-04-19 DIAGNOSIS — Z96659 Presence of unspecified artificial knee joint: Secondary | ICD-10-CM | POA: Diagnosis not present

## 2014-04-28 DIAGNOSIS — H2513 Age-related nuclear cataract, bilateral: Secondary | ICD-10-CM | POA: Diagnosis not present

## 2014-04-28 DIAGNOSIS — E119 Type 2 diabetes mellitus without complications: Secondary | ICD-10-CM | POA: Diagnosis not present

## 2014-05-03 DIAGNOSIS — Z23 Encounter for immunization: Secondary | ICD-10-CM | POA: Diagnosis not present

## 2014-05-16 DIAGNOSIS — E1129 Type 2 diabetes mellitus with other diabetic kidney complication: Secondary | ICD-10-CM | POA: Diagnosis not present

## 2014-05-16 DIAGNOSIS — Z6825 Body mass index (BMI) 25.0-25.9, adult: Secondary | ICD-10-CM | POA: Diagnosis not present

## 2014-05-16 DIAGNOSIS — I10 Essential (primary) hypertension: Secondary | ICD-10-CM | POA: Diagnosis not present

## 2014-06-12 ENCOUNTER — Ambulatory Visit (INDEPENDENT_AMBULATORY_CARE_PROVIDER_SITE_OTHER): Payer: Medicare Other | Admitting: Podiatry

## 2014-06-12 DIAGNOSIS — B351 Tinea unguium: Secondary | ICD-10-CM | POA: Diagnosis not present

## 2014-06-12 DIAGNOSIS — M79673 Pain in unspecified foot: Secondary | ICD-10-CM | POA: Diagnosis not present

## 2014-06-12 NOTE — Progress Notes (Signed)
Subjective:     Patient ID: Patrick Miranda, male   DOB: 08/29/1945, 68 y.o.   MRN: [id number]  HPI patient has thick painful nails 1-5 both feet that she cannot cut   Review of Systems     Objective:   Physical Exam Neurovascular status intact with thick yellow nailbeds 1-5 both feet that are painful    Assessment:     Mycotic nail infection with pain 1-5 both feet    Plan:     Debris painful nailbeds 1-5 both feet with no iatrogenic bleeding noted      

## 2014-07-10 ENCOUNTER — Encounter: Payer: Self-pay | Admitting: Internal Medicine

## 2014-07-10 ENCOUNTER — Ambulatory Visit (INDEPENDENT_AMBULATORY_CARE_PROVIDER_SITE_OTHER): Payer: Medicare Other | Admitting: Internal Medicine

## 2014-07-10 VITALS — BP 122/74 | HR 75 | Ht 70.5 in | Wt 185.4 lb

## 2014-07-10 DIAGNOSIS — G4733 Obstructive sleep apnea (adult) (pediatric): Secondary | ICD-10-CM | POA: Diagnosis not present

## 2014-07-10 NOTE — Patient Instructions (Signed)
Order- DME [facility] reduce CPAP to 8 cwp for comfort    Dx OSA  You can talk with [facility] about the fit of your nasal pillows mask. Show them where the pressure of the nasal pillow irritates your nose. They can try a different size or mask style.   Please call as needed

## 2014-07-10 NOTE — Progress Notes (Signed)
   Subjective:    Patient ID: Patrick Miranda, male    DOB: May 24, 1946, 68 y.o.   MRN: [id number]  HPI 07/08/13- 61 yoM never smoker Last seen in 2007. Currently still using CPAP machine every night. CPAP 10/ [facility]. Pressure recently increased based on download. NPSG 2007 AHI 31.3/ hr, CPAP to 9, weight 180 lbs. He thinks current pressure is a little high. BIPAP was too hard to use. Nasal pillows mask. Recent head cold.  Bedtime 10-12 midnight, sleep latency 30 minutes, waking 4 or 5 times during the night.  07/09/14-  67 yoM never smoker followed for OSA, complicated by hx brainstem CVA, CKD, DM2, HBP, anemia FOLLOW FOR:  OSA; pt states he had download done 3 weeks ago; mask fits fine; trouble with new mask, bothers his nose.  Thinks pressure may be too much.  Download on CPAP 9 shows AHI 8.1 per hour with excellent compliance.  Review of Systems  Constitutional: Negative for fever and unexpected weight change.  HENT: Negative for congestion, dental problem, ear pain, nosebleeds, postnasal drip, rhinorrhea, sinus pressure, sneezing, sore throat and trouble swallowing.   Eyes: Negative for redness and itching.  Respiratory: Negative for cough, chest tightness, shortness of breath and wheezing.   Cardiovascular: Negative for palpitations and leg swelling.  Gastrointestinal: Negative for nausea and vomiting.  Genitourinary: Negative for dysuria.  Musculoskeletal: Positive for arthralgias. Negative for joint swelling.  Skin: Negative for rash.  Neurological: Negative for headaches.  Hematological: Does not bruise/bleed easily.  Psychiatric/Behavioral: Negative for dysphoric mood. The patient is not nervous/anxious.      Objective:   Physical Exam OBJ- Physical Exam General- Alert, Oriented, Affect-appropriate, Distress- none acute, trim Skin- rash-none, lesions- none, excoriation- none Lymphadenopathy- none Head- atraumatic            Eyes- Gross vision intact, PERRLA,  conjunctivae and secretions clear            Ears- Hearing, canals-normal            Nose- + small pressure irritation right nostril, no-Septal dev, mucus, polyps, erosion, perforation             Throat- Mallampati II , mucosa clear , drainage- none, tonsils- atrophic,hoarse Neck- flexible , trachea midline, no stridor , thyroid nl, carotid no bruit Chest - symmetrical excursion , unlabored           Heart/CV- RRR , no murmur , no gallop  , no rub, nl s1 s2                           - JVD- none , edema- none, stasis changes- none, varices- none           Lung- clear to P&A, wheeze- none, cough-none , dullness-none, rub- none           Chest wall-  Abd-  Br/ Gen/ Rectal- Not done, not indicated Extrem- cyanosis- none, clubbing, none, atrophy- none, strength- nl Neuro- grossly intact to observation  Assessment & Plan:

## 2014-07-16 NOTE — Assessment & Plan Note (Signed)
Although AHI on download was a little higher than optimal , he wants to try a lower pressure. If he isn't comfortable he won't wear it so we're going to let him try reducing pressure from 9-8. He will talk with [facility] about fit of his nasal pillows mask.

## 2014-07-18 DIAGNOSIS — Z471 Aftercare following joint replacement surgery: Secondary | ICD-10-CM | POA: Diagnosis not present

## 2014-07-18 DIAGNOSIS — T8484XA Pain due to internal orthopedic prosthetic devices, implants and grafts, initial encounter: Secondary | ICD-10-CM | POA: Diagnosis not present

## 2014-07-18 DIAGNOSIS — Z96651 Presence of right artificial knee joint: Secondary | ICD-10-CM | POA: Diagnosis not present

## 2014-07-18 DIAGNOSIS — M25562 Pain in left knee: Secondary | ICD-10-CM | POA: Diagnosis not present

## 2014-07-18 DIAGNOSIS — M25561 Pain in right knee: Secondary | ICD-10-CM | POA: Diagnosis not present

## 2014-08-22 DIAGNOSIS — I10 Essential (primary) hypertension: Secondary | ICD-10-CM | POA: Diagnosis not present

## 2014-08-22 DIAGNOSIS — Z6826 Body mass index (BMI) 26.0-26.9, adult: Secondary | ICD-10-CM | POA: Diagnosis not present

## 2014-08-22 DIAGNOSIS — E1129 Type 2 diabetes mellitus with other diabetic kidney complication: Secondary | ICD-10-CM | POA: Diagnosis not present

## 2014-09-25 ENCOUNTER — Ambulatory Visit (INDEPENDENT_AMBULATORY_CARE_PROVIDER_SITE_OTHER): Payer: Medicare Other | Admitting: Podiatry

## 2014-09-25 DIAGNOSIS — M79673 Pain in unspecified foot: Secondary | ICD-10-CM

## 2014-09-25 DIAGNOSIS — B351 Tinea unguium: Secondary | ICD-10-CM | POA: Diagnosis not present

## 2014-09-25 NOTE — Progress Notes (Signed)
Subjective:     Patient ID: Patrick Miranda, male   DOB: [birth year], 69 y.o.   MRN: [id number]  HPI patient has thick painful nails 1-5 both feet that she cannot cut   Review of Systems     Objective:   Physical Exam Neurovascular status intact with thick yellow nailbeds 1-5 both feet that are painful    Assessment:     Mycotic nail infection with pain 1-5 both feet    Plan:     Debris painful nailbeds 1-5 both feet with no iatrogenic bleeding noted

## 2014-11-23 DIAGNOSIS — E1129 Type 2 diabetes mellitus with other diabetic kidney complication: Secondary | ICD-10-CM | POA: Diagnosis not present

## 2014-11-23 DIAGNOSIS — I10 Essential (primary) hypertension: Secondary | ICD-10-CM | POA: Diagnosis not present

## 2015-01-08 ENCOUNTER — Ambulatory Visit: Payer: Medicare Other

## 2015-01-09 ENCOUNTER — Encounter: Payer: Self-pay | Admitting: Internal Medicine

## 2015-01-11 ENCOUNTER — Ambulatory Visit (INDEPENDENT_AMBULATORY_CARE_PROVIDER_SITE_OTHER): Payer: Medicare Other | Admitting: Podiatry

## 2015-01-11 DIAGNOSIS — M79673 Pain in unspecified foot: Secondary | ICD-10-CM

## 2015-01-11 DIAGNOSIS — B351 Tinea unguium: Secondary | ICD-10-CM | POA: Diagnosis not present

## 2015-01-11 DIAGNOSIS — L600 Ingrowing nail: Secondary | ICD-10-CM | POA: Diagnosis not present

## 2015-01-11 NOTE — Progress Notes (Signed)
Subjective:     Patient ID: Patrick Miranda, male   DOB: August 25, 1945, 69 y.o.   MRN: [id number]  HPI patient has thick painful nails 1-5 both feet that she cannot cut   Review of Systems     Objective:   Physical Exam Neurovascular status intact with thick yellow nailbeds 1-5 both feet that are painful    Assessment:     Mycotic nail infection with pain 1-5 both feet    Plan:     Debris painful nailbeds 1-5 both feet with no iatrogenic bleeding noted

## 2015-02-28 DIAGNOSIS — Z6825 Body mass index (BMI) 25.0-25.9, adult: Secondary | ICD-10-CM | POA: Diagnosis not present

## 2015-02-28 DIAGNOSIS — N183 Chronic kidney disease, stage 3 (moderate): Secondary | ICD-10-CM | POA: Diagnosis not present

## 2015-02-28 DIAGNOSIS — E1129 Type 2 diabetes mellitus with other diabetic kidney complication: Secondary | ICD-10-CM | POA: Diagnosis not present

## 2015-02-28 DIAGNOSIS — I10 Essential (primary) hypertension: Secondary | ICD-10-CM | POA: Diagnosis not present

## 2015-03-22 DIAGNOSIS — L570 Actinic keratosis: Secondary | ICD-10-CM | POA: Diagnosis not present

## 2015-03-22 DIAGNOSIS — L821 Other seborrheic keratosis: Secondary | ICD-10-CM | POA: Diagnosis not present

## 2015-03-22 DIAGNOSIS — D045 Carcinoma in situ of skin of trunk: Secondary | ICD-10-CM | POA: Diagnosis not present

## 2015-03-22 DIAGNOSIS — L565 Disseminated superficial actinic porokeratosis (DSAP): Secondary | ICD-10-CM | POA: Diagnosis not present

## 2015-03-22 DIAGNOSIS — D485 Neoplasm of uncertain behavior of skin: Secondary | ICD-10-CM | POA: Diagnosis not present

## 2015-03-28 DIAGNOSIS — I10 Essential (primary) hypertension: Secondary | ICD-10-CM | POA: Diagnosis not present

## 2015-03-28 DIAGNOSIS — E119 Type 2 diabetes mellitus without complications: Secondary | ICD-10-CM | POA: Diagnosis not present

## 2015-03-28 DIAGNOSIS — E785 Hyperlipidemia, unspecified: Secondary | ICD-10-CM | POA: Diagnosis not present

## 2015-03-28 DIAGNOSIS — Z125 Encounter for screening for malignant neoplasm of prostate: Secondary | ICD-10-CM | POA: Diagnosis not present

## 2015-04-04 DIAGNOSIS — I129 Hypertensive chronic kidney disease with stage 1 through stage 4 chronic kidney disease, or unspecified chronic kidney disease: Secondary | ICD-10-CM | POA: Diagnosis not present

## 2015-04-04 DIAGNOSIS — I10 Essential (primary) hypertension: Secondary | ICD-10-CM | POA: Diagnosis not present

## 2015-04-04 DIAGNOSIS — Z23 Encounter for immunization: Secondary | ICD-10-CM | POA: Diagnosis not present

## 2015-04-04 DIAGNOSIS — E785 Hyperlipidemia, unspecified: Secondary | ICD-10-CM | POA: Diagnosis not present

## 2015-04-04 DIAGNOSIS — E1129 Type 2 diabetes mellitus with other diabetic kidney complication: Secondary | ICD-10-CM | POA: Diagnosis not present

## 2015-04-04 DIAGNOSIS — F329 Major depressive disorder, single episode, unspecified: Secondary | ICD-10-CM | POA: Diagnosis not present

## 2015-04-04 DIAGNOSIS — Z1389 Encounter for screening for other disorder: Secondary | ICD-10-CM | POA: Diagnosis not present

## 2015-04-04 DIAGNOSIS — E11319 Type 2 diabetes mellitus with unspecified diabetic retinopathy without macular edema: Secondary | ICD-10-CM | POA: Diagnosis not present

## 2015-04-04 DIAGNOSIS — N183 Chronic kidney disease, stage 3 (moderate): Secondary | ICD-10-CM | POA: Diagnosis not present

## 2015-04-04 DIAGNOSIS — E1151 Type 2 diabetes mellitus with diabetic peripheral angiopathy without gangrene: Secondary | ICD-10-CM | POA: Diagnosis not present

## 2015-04-04 DIAGNOSIS — Z6825 Body mass index (BMI) 25.0-25.9, adult: Secondary | ICD-10-CM | POA: Diagnosis not present

## 2015-04-04 DIAGNOSIS — Z0000 Encounter for general adult medical examination without abnormal findings: Secondary | ICD-10-CM | POA: Diagnosis not present

## 2015-04-04 DIAGNOSIS — D649 Anemia, unspecified: Secondary | ICD-10-CM | POA: Diagnosis not present

## 2015-04-18 ENCOUNTER — Ambulatory Visit (INDEPENDENT_AMBULATORY_CARE_PROVIDER_SITE_OTHER): Payer: Medicare Other | Admitting: Podiatry

## 2015-04-18 ENCOUNTER — Encounter: Payer: Self-pay | Admitting: Podiatry

## 2015-04-18 VITALS — BP 139/79 | HR 61 | Resp 16

## 2015-04-18 DIAGNOSIS — B351 Tinea unguium: Secondary | ICD-10-CM | POA: Diagnosis not present

## 2015-04-18 DIAGNOSIS — M79673 Pain in unspecified foot: Secondary | ICD-10-CM

## 2015-04-18 NOTE — Progress Notes (Signed)
Subjective:     Patient ID: Patrick Miranda, male   DOB: [birth year], 69 y.o.   MRN: [id number]  HPI patient presents with ingrown toenails of 1 through 5 both feet that are incurvated and get sore and he cannot cut them himself   Review of Systems     Objective:   Physical Exam Neurovascular status unchanged with incurvated nailbeds 1-5 both feet that are painful when pressed    Assessment:     Mycotic nailbeds 1-5 both feet that are painful    Plan:     Debridement nailbeds 1-5 both feet with no iatrogenic bleeding noted

## 2015-04-19 ENCOUNTER — Ambulatory Visit: Payer: Medicare Other | Admitting: Podiatry

## 2015-04-25 DIAGNOSIS — Z1212 Encounter for screening for malignant neoplasm of rectum: Secondary | ICD-10-CM | POA: Diagnosis not present

## 2015-06-05 DIAGNOSIS — I10 Essential (primary) hypertension: Secondary | ICD-10-CM | POA: Diagnosis not present

## 2015-06-05 DIAGNOSIS — E1129 Type 2 diabetes mellitus with other diabetic kidney complication: Secondary | ICD-10-CM | POA: Diagnosis not present

## 2015-06-05 DIAGNOSIS — Z6825 Body mass index (BMI) 25.0-25.9, adult: Secondary | ICD-10-CM | POA: Diagnosis not present

## 2015-06-05 DIAGNOSIS — N183 Chronic kidney disease, stage 3 (moderate): Secondary | ICD-10-CM | POA: Diagnosis not present

## 2015-06-07 DIAGNOSIS — E119 Type 2 diabetes mellitus without complications: Secondary | ICD-10-CM | POA: Diagnosis not present

## 2015-07-10 ENCOUNTER — Encounter: Payer: Self-pay | Admitting: Internal Medicine

## 2015-07-10 ENCOUNTER — Ambulatory Visit (INDEPENDENT_AMBULATORY_CARE_PROVIDER_SITE_OTHER): Payer: Medicare Other | Admitting: Internal Medicine

## 2015-07-10 VITALS — BP 124/70 | HR 79 | Ht 70.0 in | Wt 175.6 lb

## 2015-07-10 DIAGNOSIS — G4733 Obstructive sleep apnea (adult) (pediatric): Secondary | ICD-10-CM | POA: Diagnosis not present

## 2015-07-10 MED ORDER — METHYLPHENIDATE HCL 5 MG PO TABS: 5.0000 mg | ORAL_TABLET | Freq: Two times a day (BID) | ORAL | Status: DC

## 2015-07-10 NOTE — Progress Notes (Signed)
Subjective:    Patient ID: Patrick Miranda, male    DOB: May 28, 1946, 69 y.o.   MRN: [id number]  HPI 07/08/13- 60 yoM never smoker Last seen in 2007. Currently still using CPAP machine every night. CPAP 10/ [facility]. Pressure recently increased based on download. NPSG 2007 AHI 31.3/ hr, CPAP to 9, weight 180 lbs. He thinks current pressure is a little high. BIPAP was too hard to use. Nasal pillows mask. Recent head cold.  Bedtime 10-12 midnight, sleep latency 30 minutes, waking 4 or 5 times during the night.  07/09/14-  67 yoM never smoker followed for OSA, complicated by hx brainstem CVA, CKD, DM2, HBP, anemia FOLLOW FOR:  OSA; pt states he had download done 3 weeks ago; mask fits fine; trouble with new mask, bothers his nose.  Thinks pressure may be too much.  Download on CPAP 9 shows AHI 8.1 per hour with excellent compliance.  07/10/2015-69 year old male never smoker followed for OSA complicated by history rain stem CVA, CAD, DM 2, HBP, anemia CPAP 8/[facility] FOLLOWS FOR DME: [facility]. Pt states wearing CPAP every night for at least 4-6 hours per night. No problems with pressure, machine, or equipment  Wife is here and says that he will snore a little if his mask shifts, otherwise control seems good. They notice daytime sleepiness anytime he sits quietly, evening TV, church etc. He drowsy in front of the TV until bedtime around 1 or 2 AM and then up at 8 AM. No deliberate naps.  ROS-see HPI   Negative unless "+" Constitutional:    weight loss, night sweats, fevers, chills, + fatigue, lassitude. HEENT:    headaches, difficulty swallowing, tooth/dental problems, sore throat,       sneezing, itching, ear ache, nasal congestion, post nasal drip, snoring CV:    chest pain, orthopnea, PND, swelling in lower extremities, anasarca,                                                      dizziness, palpitations Resp:   shortness of breath with exertion or at rest.                productive cough,    non-productive cough, coughing up of blood.              change in color of mucus.  wheezing.   Skin:    rash or lesions. GI:  No-   heartburn, indigestion, abdominal pain, nausea, vomiting, GU:  MS:   joint pain, stiffness, Neuro-     nothing unusual Psych:  change in mood or affect.  depression or anxiety.   memory loss.      Objective:   Physical Exam OBJ- Physical Exam General- Alert, Oriented, Affect-appropriate, Distress- none acute, trim, very calm Skin- rash-none, lesions- none, excoriation- none Lymphadenopathy- none Head- atraumatic            Eyes- Gross vision intact, PERRLA, conjunctivae and secretions clear            Ears- Hearing, canals-normal            Nose- + small pressure irritation right nostril, no-Septal dev, mucus, polyps, erosion, perforation             Throat- Mallampati II , mucosa clear , drainage- none, tonsils- atrophic,hoarse Neck- flexible , trachea midline,  no stridor , thyroid nl, carotid no bruit Chest - symmetrical excursion , unlabored           Heart/CV- RRR , no murmur , no gallop  , no rub, nl s1 s2                           - JVD- none , edema- none, stasis changes- none, varices- none           Lung- clear to P&A, wheeze- none, cough-none , dullness-none, rub- none           Chest wall-  Abd-  Br/ Gen/ Rectal- Not done, not indicated Extrem- cyanosis- none, clubbing, none, atrophy- none, strength- nl Neuro- grossly intact to observation  Assessment & Plan:

## 2015-07-10 NOTE — Addendum Note (Signed)
Addended by: Virl Cagey on: 07/10/2015 10:10 AM   Modules accepted: Orders

## 2015-07-10 NOTE — Patient Instructions (Signed)
Try taking a deliberate nap with your CPAP somedays, for 30-45 minutes. See if that helps with the drowsiness.  Script printed to try methylphenidate (Ritalin) once or twice daily if needed, occasionally, for drowsiness.  Order- DME [facility]- download CPAP for pressure compliance   Dx OSA

## 2015-07-10 NOTE — Assessment & Plan Note (Signed)
It sounds as if his CPAP pressure is appropriate currently at 8. He is probably getting enough nighttime sleep although bedtime as late. His wife is bothered by his daytime sleepiness especially for church. Plan-we are calling DME for download. Meanwhile after appropriate discussion, he is going to try occasional Ritalin 5 mg. Recommend trial of naps with CPAP

## 2015-07-19 ENCOUNTER — Telehealth: Payer: Self-pay | Admitting: *Deleted

## 2015-07-19 NOTE — Telephone Encounter (Signed)
PA has been approved by [employer] until 07/21/15 due to pt coverage only until 07/21/15.  Pharmacy informed.

## 2015-08-09 DIAGNOSIS — Z6824 Body mass index (BMI) 24.0-24.9, adult: Secondary | ICD-10-CM | POA: Diagnosis not present

## 2015-08-09 DIAGNOSIS — E11319 Type 2 diabetes mellitus with unspecified diabetic retinopathy without macular edema: Secondary | ICD-10-CM | POA: Diagnosis not present

## 2015-08-09 DIAGNOSIS — N183 Chronic kidney disease, stage 3 (moderate): Secondary | ICD-10-CM | POA: Diagnosis not present

## 2015-08-09 DIAGNOSIS — Z1389 Encounter for screening for other disorder: Secondary | ICD-10-CM | POA: Diagnosis not present

## 2015-08-09 DIAGNOSIS — E1151 Type 2 diabetes mellitus with diabetic peripheral angiopathy without gangrene: Secondary | ICD-10-CM | POA: Diagnosis not present

## 2015-08-09 DIAGNOSIS — E1139 Type 2 diabetes mellitus with other diabetic ophthalmic complication: Secondary | ICD-10-CM | POA: Diagnosis not present

## 2015-08-09 DIAGNOSIS — J0410 Acute tracheitis without obstruction: Secondary | ICD-10-CM | POA: Diagnosis not present

## 2015-08-09 DIAGNOSIS — D649 Anemia, unspecified: Secondary | ICD-10-CM | POA: Diagnosis not present

## 2015-08-09 DIAGNOSIS — F329 Major depressive disorder, single episode, unspecified: Secondary | ICD-10-CM | POA: Diagnosis not present

## 2015-08-09 DIAGNOSIS — Z7901 Long term (current) use of anticoagulants: Secondary | ICD-10-CM | POA: Diagnosis not present

## 2015-08-09 DIAGNOSIS — R0989 Other specified symptoms and signs involving the circulatory and respiratory systems: Secondary | ICD-10-CM | POA: Diagnosis not present

## 2015-08-09 DIAGNOSIS — I129 Hypertensive chronic kidney disease with stage 1 through stage 4 chronic kidney disease, or unspecified chronic kidney disease: Secondary | ICD-10-CM | POA: Diagnosis not present

## 2015-08-21 ENCOUNTER — Encounter: Payer: Self-pay | Admitting: Podiatry

## 2015-08-21 ENCOUNTER — Ambulatory Visit (INDEPENDENT_AMBULATORY_CARE_PROVIDER_SITE_OTHER): Payer: Medicare Other | Admitting: Podiatry

## 2015-08-21 DIAGNOSIS — B351 Tinea unguium: Secondary | ICD-10-CM

## 2015-08-21 DIAGNOSIS — M79673 Pain in unspecified foot: Secondary | ICD-10-CM | POA: Diagnosis not present

## 2015-08-21 NOTE — Progress Notes (Signed)
Patient ID: Patrick Miranda, male   DOB: 09-29-1945, 70 y.o.   MRN: [id number]  Subjective: 70 y.o. returns the office today for painful, elongated, thickened toenails which he cannot trim himself. Denies any redness or drainage around the nails. Denies any acute changes since last appointment and no new complaints today. Denies any systemic complaints such as fevers, chills, nausea, vomiting.   Objective: AAO 3, NAD DP/PT pulses palpable, CRT less than 3 seconds  Nails hypertrophic, dystrophic, elongated, brittle, discolored 10. Previous left hallux nail has been removed. There is tenderness overlying the nails 1-5 bilaterally. There is no surrounding erythema or drainage along the nail sites. No open lesions or pre-ulcerative lesions are identified. No other areas of tenderness bilateral lower extremities. No overlying edema, erythema, increased warmth. No pain with calf compression, swelling, warmth, erythema.  Assessment: Patient presents with symptomatic onychomycosis  Plan: -Treatment options including alternatives, risks, complications were discussed -Nails sharply debrided 9 without complication/bleeding. -Discussed daily foot inspection. If there are any changes, to call the office immediately.  -Follow-up in 3 months or sooner if any problems are to arise. In the meantime, encouraged to call the office with any questions, concerns, changes symptoms.  Celesta Gentile, DPM

## 2015-11-07 DIAGNOSIS — Z6825 Body mass index (BMI) 25.0-25.9, adult: Secondary | ICD-10-CM | POA: Diagnosis not present

## 2015-11-07 DIAGNOSIS — I10 Essential (primary) hypertension: Secondary | ICD-10-CM | POA: Diagnosis not present

## 2015-11-07 DIAGNOSIS — N183 Chronic kidney disease, stage 3 (moderate): Secondary | ICD-10-CM | POA: Diagnosis not present

## 2015-11-07 DIAGNOSIS — E1129 Type 2 diabetes mellitus with other diabetic kidney complication: Secondary | ICD-10-CM | POA: Diagnosis not present

## 2015-11-27 ENCOUNTER — Encounter: Payer: Self-pay | Admitting: Podiatry

## 2015-11-27 ENCOUNTER — Ambulatory Visit (INDEPENDENT_AMBULATORY_CARE_PROVIDER_SITE_OTHER): Payer: Medicare Other | Admitting: Podiatry

## 2015-11-27 DIAGNOSIS — M79673 Pain in unspecified foot: Secondary | ICD-10-CM | POA: Diagnosis not present

## 2015-11-27 DIAGNOSIS — B351 Tinea unguium: Secondary | ICD-10-CM

## 2015-11-27 NOTE — Progress Notes (Signed)
Patient ID: Patrick Miranda, male   DOB: [birth year], 70 y.o.   MRN: [id number] Complaint:  Visit Type: Patient returns to my office for continued preventative foot care services. Complaint: Patient states" my nails have grown long and thick and become painful to walk and wear shoes" Patient has been diagnosed with DM with no foot complications. The patient presents for preventative foot care services. No changes to ROS  Podiatric Exam: Vascular: dorsalis pedis and posterior tibial pulses are palpable bilateral. Capillary return is immediate. Temperature gradient is WNL. Skin turgor WNL  Sensorium: Normal Semmes Weinstein monofilament test. Normal tactile sensation bilaterally. Nail Exam: Pt has thick disfigured discolored nails with subungual debris noted bilateral entire nail hallux through fifth toenails except left hallux. Ulcer Exam: There is no evidence of ulcer or pre-ulcerative changes or infection. Orthopedic Exam: Muscle tone and strength are WNL. No limitations in general ROM. No crepitus or effusions noted. Foot type and digits show no abnormalities. HAV  B/L. Overlapping second toe left foot. Skin: No Porokeratosis. No infection or ulcers  Diagnosis:  Onychomycosis, , Pain in right toe, pain in left toes  Treatment & Plan Procedures and Treatment: Consent by patient was obtained for treatment procedures. The patient understood the discussion of treatment and procedures well. All questions were answered thoroughly reviewed. Debridement of mycotic and hypertrophic toenails, 1 through 5 bilateral and clearing of subungual debris. No ulceration, no infection noted.  Return Visit-Office Procedure: Patient instructed to return to the office for a follow up visit 3 months for continued evaluation and treatment.    Gardiner Barefoot DPM

## 2015-12-05 DIAGNOSIS — E1129 Type 2 diabetes mellitus with other diabetic kidney complication: Secondary | ICD-10-CM | POA: Diagnosis not present

## 2015-12-19 DIAGNOSIS — E1129 Type 2 diabetes mellitus with other diabetic kidney complication: Secondary | ICD-10-CM | POA: Diagnosis not present

## 2015-12-19 DIAGNOSIS — Z6824 Body mass index (BMI) 24.0-24.9, adult: Secondary | ICD-10-CM | POA: Diagnosis not present

## 2015-12-19 DIAGNOSIS — I10 Essential (primary) hypertension: Secondary | ICD-10-CM | POA: Diagnosis not present

## 2015-12-19 DIAGNOSIS — N183 Chronic kidney disease, stage 3 (moderate): Secondary | ICD-10-CM | POA: Diagnosis not present

## 2016-01-30 DIAGNOSIS — E1129 Type 2 diabetes mellitus with other diabetic kidney complication: Secondary | ICD-10-CM | POA: Diagnosis not present

## 2016-01-30 DIAGNOSIS — I10 Essential (primary) hypertension: Secondary | ICD-10-CM | POA: Diagnosis not present

## 2016-01-30 DIAGNOSIS — Z6824 Body mass index (BMI) 24.0-24.9, adult: Secondary | ICD-10-CM | POA: Diagnosis not present

## 2016-01-30 DIAGNOSIS — N183 Chronic kidney disease, stage 3 (moderate): Secondary | ICD-10-CM | POA: Diagnosis not present

## 2016-01-30 DIAGNOSIS — E1151 Type 2 diabetes mellitus with diabetic peripheral angiopathy without gangrene: Secondary | ICD-10-CM | POA: Diagnosis not present

## 2016-02-26 ENCOUNTER — Ambulatory Visit (INDEPENDENT_AMBULATORY_CARE_PROVIDER_SITE_OTHER): Payer: Medicare Other | Admitting: Podiatry

## 2016-02-26 DIAGNOSIS — M79673 Pain in unspecified foot: Secondary | ICD-10-CM | POA: Diagnosis not present

## 2016-02-26 DIAGNOSIS — B351 Tinea unguium: Secondary | ICD-10-CM

## 2016-02-26 NOTE — Progress Notes (Signed)
Patient ID: Patrick Miranda, male   DOB: 1945-12-14, 70 y.o.   MRN: [id number] Complaint:  Visit Type: Patient returns to my office for continued preventative foot care services. Complaint: Patient states" my nails have grown long and thick and become painful to walk and wear shoes" Patient has been diagnosed with DM with no foot complications. The patient presents for preventative foot care services. No changes to ROS  Podiatric Exam: Vascular: dorsalis pedis and posterior tibial pulses are palpable bilateral. Capillary return is immediate. Temperature gradient is WNL. Skin turgor WNL  Sensorium: Normal Semmes Weinstein monofilament test. Normal tactile sensation bilaterally. Nail Exam: Pt has thick disfigured discolored nails with subungual debris noted bilateral entire nail hallux through fifth toenails except left hallux. Ulcer Exam: There is no evidence of ulcer or pre-ulcerative changes or infection. Orthopedic Exam: Muscle tone and strength are WNL. No limitations in general ROM. No crepitus or effusions noted. Foot type and digits show no abnormalities. HAV  B/L. Overlapping second toe left foot. Skin: No Porokeratosis. No infection or ulcers  Diagnosis:  Onychomycosis, , Pain in right toe, pain in left toes  Treatment & Plan Procedures and Treatment: Consent by patient was obtained for treatment procedures. The patient understood the discussion of treatment and procedures well. All questions were answered thoroughly reviewed. Debridement of mycotic and hypertrophic toenails, 1 through 5 bilateral and clearing of subungual debris. No ulceration, no infection noted.  Return Visit-Office Procedure: Patient instructed to return to the office for a follow up visit 3 months for continued evaluation and treatment.    Gardiner Barefoot DPM

## 2016-03-25 DIAGNOSIS — Z85828 Personal history of other malignant neoplasm of skin: Secondary | ICD-10-CM | POA: Diagnosis not present

## 2016-03-25 DIAGNOSIS — C44329 Squamous cell carcinoma of skin of other parts of face: Secondary | ICD-10-CM | POA: Diagnosis not present

## 2016-03-25 DIAGNOSIS — D1801 Hemangioma of skin and subcutaneous tissue: Secondary | ICD-10-CM | POA: Diagnosis not present

## 2016-03-25 DIAGNOSIS — L820 Inflamed seborrheic keratosis: Secondary | ICD-10-CM | POA: Diagnosis not present

## 2016-03-25 DIAGNOSIS — D485 Neoplasm of uncertain behavior of skin: Secondary | ICD-10-CM | POA: Diagnosis not present

## 2016-03-25 DIAGNOSIS — L570 Actinic keratosis: Secondary | ICD-10-CM | POA: Diagnosis not present

## 2016-03-25 DIAGNOSIS — L821 Other seborrheic keratosis: Secondary | ICD-10-CM | POA: Diagnosis not present

## 2016-03-31 DIAGNOSIS — E784 Other hyperlipidemia: Secondary | ICD-10-CM | POA: Diagnosis not present

## 2016-03-31 DIAGNOSIS — E1151 Type 2 diabetes mellitus with diabetic peripheral angiopathy without gangrene: Secondary | ICD-10-CM | POA: Diagnosis not present

## 2016-03-31 DIAGNOSIS — I10 Essential (primary) hypertension: Secondary | ICD-10-CM | POA: Diagnosis not present

## 2016-03-31 DIAGNOSIS — Z125 Encounter for screening for malignant neoplasm of prostate: Secondary | ICD-10-CM | POA: Diagnosis not present

## 2016-04-08 ENCOUNTER — Telehealth: Payer: Self-pay | Admitting: Internal Medicine

## 2016-04-08 MED ORDER — METHYLPHENIDATE HCL 5 MG PO TABS
5.0000 mg | ORAL_TABLET | Freq: Two times a day (BID) | ORAL | 0 refills | Status: DC
Start: 2016-04-08 — End: 2016-07-10

## 2016-04-08 NOTE — Telephone Encounter (Signed)
Spoke with pt's wife. Pt is needing a refill on Ritalin 5mg . They would like to pick this prescription up. Last refill was on 07/10/15 #30. Last OV was on 07/10/15.  CY - please advise. Thanks.

## 2016-04-08 NOTE — Telephone Encounter (Signed)
Ok to refill 

## 2016-04-08 NOTE — Telephone Encounter (Signed)
Spoke with pt's wife. She is aware that this prescription is ready to be picked up. Nothing further was needed.

## 2016-04-09 DIAGNOSIS — I10 Essential (primary) hypertension: Secondary | ICD-10-CM | POA: Diagnosis not present

## 2016-04-09 DIAGNOSIS — Z6824 Body mass index (BMI) 24.0-24.9, adult: Secondary | ICD-10-CM | POA: Diagnosis not present

## 2016-04-09 DIAGNOSIS — E1139 Type 2 diabetes mellitus with other diabetic ophthalmic complication: Secondary | ICD-10-CM | POA: Diagnosis not present

## 2016-04-09 DIAGNOSIS — F329 Major depressive disorder, single episode, unspecified: Secondary | ICD-10-CM | POA: Diagnosis not present

## 2016-04-09 DIAGNOSIS — Z23 Encounter for immunization: Secondary | ICD-10-CM | POA: Diagnosis not present

## 2016-04-09 DIAGNOSIS — E11319 Type 2 diabetes mellitus with unspecified diabetic retinopathy without macular edema: Secondary | ICD-10-CM | POA: Diagnosis not present

## 2016-04-09 DIAGNOSIS — N183 Chronic kidney disease, stage 3 (moderate): Secondary | ICD-10-CM | POA: Diagnosis not present

## 2016-04-09 DIAGNOSIS — E1129 Type 2 diabetes mellitus with other diabetic kidney complication: Secondary | ICD-10-CM | POA: Diagnosis not present

## 2016-04-09 DIAGNOSIS — E1151 Type 2 diabetes mellitus with diabetic peripheral angiopathy without gangrene: Secondary | ICD-10-CM | POA: Diagnosis not present

## 2016-04-09 DIAGNOSIS — I129 Hypertensive chronic kidney disease with stage 1 through stage 4 chronic kidney disease, or unspecified chronic kidney disease: Secondary | ICD-10-CM | POA: Diagnosis not present

## 2016-04-09 DIAGNOSIS — Z0000 Encounter for general adult medical examination without abnormal findings: Secondary | ICD-10-CM | POA: Diagnosis not present

## 2016-04-09 DIAGNOSIS — H6123 Impacted cerumen, bilateral: Secondary | ICD-10-CM | POA: Diagnosis not present

## 2016-04-09 DIAGNOSIS — Z7901 Long term (current) use of anticoagulants: Secondary | ICD-10-CM | POA: Diagnosis not present

## 2016-04-14 DIAGNOSIS — Z1212 Encounter for screening for malignant neoplasm of rectum: Secondary | ICD-10-CM | POA: Diagnosis not present

## 2016-04-30 DIAGNOSIS — E1129 Type 2 diabetes mellitus with other diabetic kidney complication: Secondary | ICD-10-CM | POA: Diagnosis not present

## 2016-04-30 DIAGNOSIS — Z6824 Body mass index (BMI) 24.0-24.9, adult: Secondary | ICD-10-CM | POA: Diagnosis not present

## 2016-04-30 DIAGNOSIS — N183 Chronic kidney disease, stage 3 (moderate): Secondary | ICD-10-CM | POA: Diagnosis not present

## 2016-04-30 DIAGNOSIS — I10 Essential (primary) hypertension: Secondary | ICD-10-CM | POA: Diagnosis not present

## 2016-05-30 ENCOUNTER — Ambulatory Visit (INDEPENDENT_AMBULATORY_CARE_PROVIDER_SITE_OTHER): Payer: Medicare Other | Admitting: Podiatry

## 2016-05-30 DIAGNOSIS — M79676 Pain in unspecified toe(s): Secondary | ICD-10-CM

## 2016-05-30 DIAGNOSIS — B351 Tinea unguium: Secondary | ICD-10-CM

## 2016-05-30 NOTE — Progress Notes (Signed)
Patient ID: Patrick Miranda, male   DOB: Oct 08, 1945, 70 y.o.   MRN: [id number] Complaint:  Visit Type: Patient returns to my office for continued preventative foot care services. Complaint: Patient states" my nails have grown long and thick and become painful to walk and wear shoes" Patient has been diagnosed with DM with no foot complications. The patient presents for preventative foot care services. No changes to ROS  Podiatric Exam: Vascular: dorsalis pedis and posterior tibial pulses are palpable bilateral. Capillary return is immediate. Temperature gradient is WNL. Skin turgor WNL  Sensorium: Normal Semmes Weinstein monofilament test. Normal tactile sensation bilaterally. Nail Exam: Pt has thick disfigured discolored nails with subungual debris noted bilateral entire nail hallux through fifth toenails except left hallux. Ulcer Exam: There is no evidence of ulcer or pre-ulcerative changes or infection. Orthopedic Exam: Muscle tone and strength are WNL. No limitations in general ROM. No crepitus or effusions noted. Foot type and digits show no abnormalities. HAV  B/L. Overlapping second toe left foot. Skin: No Porokeratosis. No infection or ulcers  Diagnosis:  Onychomycosis, , Pain in right toe, pain in left toes  Treatment & Plan Procedures and Treatment: Consent by patient was obtained for treatment procedures. The patient understood the discussion of treatment and procedures well. All questions were answered thoroughly reviewed. Debridement of mycotic and hypertrophic toenails, 1 through 5 bilateral and clearing of subungual debris. No ulceration, no infection noted.  Return Visit-Office Procedure: Patient instructed to return to the office for a follow up visit 3 months for continued evaluation and treatment.    Gardiner Barefoot DPM

## 2016-07-10 ENCOUNTER — Ambulatory Visit (INDEPENDENT_AMBULATORY_CARE_PROVIDER_SITE_OTHER): Payer: Medicare Other | Admitting: Internal Medicine

## 2016-07-10 ENCOUNTER — Encounter: Payer: Self-pay | Admitting: Internal Medicine

## 2016-07-10 VITALS — BP 118/62 | HR 71 | Ht 70.0 in | Wt 177.0 lb

## 2016-07-10 DIAGNOSIS — I639 Cerebral infarction, unspecified: Secondary | ICD-10-CM

## 2016-07-10 DIAGNOSIS — G4733 Obstructive sleep apnea (adult) (pediatric): Secondary | ICD-10-CM | POA: Diagnosis not present

## 2016-07-10 MED ORDER — METHYLPHENIDATE HCL 5 MG PO TABS: 5.0000 mg | ORAL_TABLET | Freq: Two times a day (BID) | ORAL | 0 refills | Status: DC

## 2016-07-10 NOTE — Progress Notes (Signed)
Subjective:    Patient ID: Patrick Miranda, male    DOB: 1946-05-25, 70 y.o.   MRN: [id number]  HPI M never smoker followed for OSA, complicated by history brainstem CVA, CAD, DM 2, HBP, anemia. NPSG 2007 AHI 31.3/ hr, CPAP to 9, weight 180 lbs.  ----------------------------------------------------------------------------------------   07/10/2015-70 year old male never smoker followed for OSA complicated by history rain stem CVA, CAD, DM 2, HBP, anemia CPAP 8/[facility] FOLLOWS FOR DME: [facility]. Pt states wearing CPAP every night for at least 4-6 hours per night. No problems with pressure, machine, or equipment  Wife is here and says that he will snore a little if his mask shifts, otherwise control seems good. They notice daytime sleepiness anytime he sits quietly, evening TV, church etc. He drowsy in front of the TV until bedtime around 1 or 2 AM and then up at 8 AM. No deliberate naps.  07/10/2016-70 year old male never smoker followed for OSA, Came by history brainstem CVA, CAD, DM 2, HBP, anemia CPAP 8/[facility] FOLLOWS FOR:[facility]ME [facility]. Pt wears CPAP every night for at least 4-6 hours; pressure working well. Ritalin rx needed.  Very comfortable with pressure but download shows residual AHI 7.0/hour with excellent compliance. Wife admits he snores through CPAP "sometimes". Ritalin works very well for occasional use such as at [employer].  ROS-see HPI   Negative unless "+" Constitutional:    weight loss, night sweats, fevers, chills, + fatigue, lassitude. HEENT:    headaches, difficulty swallowing, tooth/dental problems, sore throat,       sneezing, itching, ear ache, nasal congestion, post nasal drip, snoring CV:    chest pain, orthopnea, PND, swelling in lower extremities, anasarca,                                                      dizziness, palpitations Resp:   shortness of breath with exertion or at rest.                productive cough,   non-productive cough, coughing up of blood.              change in color of mucus.  wheezing.   Skin:    rash or lesions. GI:  No-   heartburn, indigestion, abdominal pain, nausea, vomiting, GU:  MS:   joint pain, stiffness, Neuro-     nothing unusual Psych:  change in mood or affect.  depression or anxiety.   memory loss.      Objective:   Physical Exam OBJ- Physical Exam General- Alert, Oriented, Affect-appropriate, Distress- none acute, trim,  Skin- rash-none, lesions- none, excoriation- none Lymphadenopathy- none Head- atraumatic            Eyes- Gross vision intact, PERRLA, conjunctivae and secretions clear            Ears- Hearing, canals-normal            Nose- + small pressure irritation right nostril, no-Septal dev, mucus, polyps, erosion, perforation             Throat- Mallampati II , mucosa clear , drainage- none, tonsils- atrophic,hoarse Neck- flexible , trachea midline, no stridor , thyroid nl, carotid no bruit Chest - symmetrical excursion , unlabored           Heart/CV- RRR , no murmur , no gallop  , no  rub, nl s1 s2                           - JVD- none , edema- none, stasis changes- none, varices- none           Lung- clear to P&A, wheeze- none, cough-none , dullness-none, rub- none           Chest wall-  Abd-  Br/ Gen/ Rectal- Not done, not indicated Extrem- cyanosis- none, clubbing, none, atrophy- none, strength- nl Neuro- grossly intact to observation  Assessment & Plan:

## 2016-07-10 NOTE — Assessment & Plan Note (Signed)
No new neurologic events recognized. He continues to be monitored.

## 2016-07-10 NOTE — Assessment & Plan Note (Signed)
He and his wife report he is doing very well with CPAP except that he snores through it sometimes. Download shows AHI of 7.0. Plan-try increasing CPAP pressure to 10

## 2016-07-10 NOTE — Patient Instructions (Addendum)
Order- DME [facility]- increase CPAP to 10 cwp, continue mask of choice, humidifier, supplies, AirView   Dx OSA     Ritalin refilled   Please call as needed

## 2016-07-29 DIAGNOSIS — Z6825 Body mass index (BMI) 25.0-25.9, adult: Secondary | ICD-10-CM | POA: Diagnosis not present

## 2016-07-29 DIAGNOSIS — E1129 Type 2 diabetes mellitus with other diabetic kidney complication: Secondary | ICD-10-CM | POA: Diagnosis not present

## 2016-07-29 DIAGNOSIS — J00 Acute nasopharyngitis [common cold]: Secondary | ICD-10-CM | POA: Diagnosis not present

## 2016-07-29 DIAGNOSIS — I10 Essential (primary) hypertension: Secondary | ICD-10-CM | POA: Diagnosis not present

## 2016-07-29 DIAGNOSIS — N183 Chronic kidney disease, stage 3 (moderate): Secondary | ICD-10-CM | POA: Diagnosis not present

## 2016-08-29 ENCOUNTER — Ambulatory Visit (INDEPENDENT_AMBULATORY_CARE_PROVIDER_SITE_OTHER): Payer: Medicare Other | Admitting: Podiatry

## 2016-08-29 ENCOUNTER — Encounter: Payer: Self-pay | Admitting: Podiatry

## 2016-08-29 DIAGNOSIS — B351 Tinea unguium: Secondary | ICD-10-CM

## 2016-08-29 DIAGNOSIS — E119 Type 2 diabetes mellitus without complications: Secondary | ICD-10-CM

## 2016-08-29 NOTE — Progress Notes (Signed)
Patient ID: Patrick Miranda, male   DOB: 04/09/1946, 70 y.o.   MRN: [id number] Complaint:  Visit Type: Patient returns to my office for continued preventative foot care services. Complaint: Patient states" my nails have grown long and thick and become painful to walk and wear shoes" Patient has been diagnosed with DM with no foot complications. The patient presents for preventative foot care services. No changes to ROS  Podiatric Exam: Vascular: dorsalis pedis and posterior tibial pulses are palpable bilateral. Capillary return is immediate. Temperature gradient is WNL. Skin turgor WNL  Sensorium: Normal Semmes Weinstein monofilament test. Normal tactile sensation bilaterally. Nail Exam: Pt has thick disfigured discolored nails with subungual debris noted bilateral entire nail hallux through fifth toenails except left hallux. Ulcer Exam: There is no evidence of ulcer or pre-ulcerative changes or infection. Orthopedic Exam: Muscle tone and strength are WNL. No limitations in general ROM. No crepitus or effusions noted. Foot type and digits show no abnormalities. HAV  B/L. Overlapping second toe left foot. Skin: No Porokeratosis. No infection or ulcers  Diagnosis:  Onychomycosis, , Pain in right toe, pain in left toes  Treatment & Plan Procedures and Treatment: Consent by patient was obtained for treatment procedures. The patient understood the discussion of treatment and procedures well. All questions were answered thoroughly reviewed. Debridement of mycotic and hypertrophic toenails, 1 through 5 bilateral and clearing of subungual debris. No ulceration, no infection noted. Patient is not interested in diabetic shoes. Return Visit-Office Procedure: Patient instructed to return to the office for a follow up visit 3 months for continued evaluation and treatment.    Keandre Linden DPM 

## 2016-10-28 DIAGNOSIS — I10 Essential (primary) hypertension: Secondary | ICD-10-CM | POA: Diagnosis not present

## 2016-10-28 DIAGNOSIS — E1129 Type 2 diabetes mellitus with other diabetic kidney complication: Secondary | ICD-10-CM | POA: Diagnosis not present

## 2016-10-28 DIAGNOSIS — N183 Chronic kidney disease, stage 3 (moderate): Secondary | ICD-10-CM | POA: Diagnosis not present

## 2016-10-28 DIAGNOSIS — Z6824 Body mass index (BMI) 24.0-24.9, adult: Secondary | ICD-10-CM | POA: Diagnosis not present

## 2016-11-26 ENCOUNTER — Ambulatory Visit (INDEPENDENT_AMBULATORY_CARE_PROVIDER_SITE_OTHER): Payer: Medicare Other | Admitting: Podiatry

## 2016-11-26 ENCOUNTER — Encounter: Payer: Self-pay | Admitting: Podiatry

## 2016-11-26 DIAGNOSIS — B351 Tinea unguium: Secondary | ICD-10-CM | POA: Diagnosis not present

## 2016-11-26 DIAGNOSIS — M79676 Pain in unspecified toe(s): Secondary | ICD-10-CM | POA: Diagnosis not present

## 2016-11-26 NOTE — Progress Notes (Signed)
Patient ID: Patrick Miranda, male   DOB: 19-Apr-1946, 71 y.o.   MRN: [id number] Complaint:  Visit Type: Patient returns to my office for continued preventative foot care services. Complaint: Patient states" my nails have grown long and thick and become painful to walk and wear shoes" Patient has been diagnosed with DM with no foot complications. The patient presents for preventative foot care services. No changes to ROS  Podiatric Exam: Vascular: dorsalis pedis and posterior tibial pulses are palpable bilateral. Capillary return is immediate. Temperature gradient is WNL. Skin turgor WNL  Sensorium: Normal Semmes Weinstein monofilament test. Normal tactile sensation bilaterally. Nail Exam: Pt has thick disfigured discolored nails with subungual debris noted bilateral entire nail hallux through fifth toenails except left hallux. Ulcer Exam: There is no evidence of ulcer or pre-ulcerative changes or infection. Orthopedic Exam: Muscle tone and strength are WNL. No limitations in general ROM. No crepitus or effusions noted. Foot type and digits show no abnormalities. HAV  B/L. Overlapping second toe left foot. Skin: No Porokeratosis. No infection or ulcers  Diagnosis:  Onychomycosis, , Pain in right toe, pain in left toes  Treatment & Plan Procedures and Treatment: Consent by patient was obtained for treatment procedures. The patient understood the discussion of treatment and procedures well. All questions were answered thoroughly reviewed. Debridement of mycotic and hypertrophic toenails, 1 through 5 bilateral and clearing of subungual debris. No ulceration, no infection noted. Patient is not interested in diabetic shoes. Return Visit-Office Procedure: Patient instructed to return to the office for a follow up visit 3 months for continued evaluation and treatment.    Gardiner Barefoot DPM

## 2017-01-29 DIAGNOSIS — I10 Essential (primary) hypertension: Secondary | ICD-10-CM | POA: Diagnosis not present

## 2017-01-29 DIAGNOSIS — E1129 Type 2 diabetes mellitus with other diabetic kidney complication: Secondary | ICD-10-CM | POA: Diagnosis not present

## 2017-01-29 DIAGNOSIS — N183 Chronic kidney disease, stage 3 (moderate): Secondary | ICD-10-CM | POA: Diagnosis not present

## 2017-02-25 ENCOUNTER — Ambulatory Visit (INDEPENDENT_AMBULATORY_CARE_PROVIDER_SITE_OTHER): Payer: Medicare Other | Admitting: Podiatry

## 2017-02-25 ENCOUNTER — Encounter: Payer: Self-pay | Admitting: Podiatry

## 2017-02-25 DIAGNOSIS — E119 Type 2 diabetes mellitus without complications: Secondary | ICD-10-CM

## 2017-02-25 DIAGNOSIS — M79676 Pain in unspecified toe(s): Secondary | ICD-10-CM

## 2017-02-25 DIAGNOSIS — B351 Tinea unguium: Secondary | ICD-10-CM | POA: Diagnosis not present

## 2017-02-25 NOTE — Progress Notes (Signed)
Patient ID: Patrick Miranda, male   DOB: 1946-05-20, 71 y.o.   MRN: [id number] Complaint:  Visit Type: Patient returns to my office for continued preventative foot care services. Complaint: Patient states" my nails have grown long and thick and become painful to walk and wear shoes" Patient has been diagnosed with DM with no foot complications. The patient presents for preventative foot care services. No changes to ROS  Podiatric Exam: Vascular: dorsalis pedis and posterior tibial pulses are palpable bilateral. Capillary return is immediate. Temperature gradient is WNL. Skin turgor WNL  Sensorium: Normal Semmes Weinstein monofilament test. Normal tactile sensation bilaterally. Nail Exam: Pt has thick disfigured discolored nails with subungual debris noted bilateral entire nail hallux through fifth toenails except left hallux. Ulcer Exam: There is no evidence of ulcer or pre-ulcerative changes or infection. Orthopedic Exam: Muscle tone and strength are WNL. No limitations in general ROM. No crepitus or effusions noted. Foot type and digits show no abnormalities. HAV  B/L. Overlapping second toe left foot. Skin: No Porokeratosis. No infection or ulcers  Diagnosis:  Onychomycosis, , Pain in right toe, pain in left toes  Treatment & Plan Procedures and Treatment: Consent by patient was obtained for treatment procedures. The patient understood the discussion of treatment and procedures well. All questions were answered thoroughly reviewed. Debridement of mycotic and hypertrophic toenails, 1 through 5 bilateral and clearing of subungual debris. No ulceration, no infection noted. Patient is not interested in diabetic shoes. Return Visit-Office Procedure: Patient instructed to return to the office for a follow up visit 3 months for continued evaluation and treatment.    Gardiner Barefoot DPM

## 2017-03-26 DIAGNOSIS — L570 Actinic keratosis: Secondary | ICD-10-CM | POA: Diagnosis not present

## 2017-03-26 DIAGNOSIS — Z85828 Personal history of other malignant neoplasm of skin: Secondary | ICD-10-CM | POA: Diagnosis not present

## 2017-03-26 DIAGNOSIS — L821 Other seborrheic keratosis: Secondary | ICD-10-CM | POA: Diagnosis not present

## 2017-03-30 ENCOUNTER — Telehealth: Payer: Self-pay | Admitting: Internal Medicine

## 2017-03-30 MED ORDER — METHYLPHENIDATE HCL 5 MG PO TABS: 5.0000 mg | ORAL_TABLET | Freq: Two times a day (BID) | ORAL | 0 refills | Status: DC

## 2017-03-30 NOTE — Telephone Encounter (Signed)
Wife asks refill methyphenidate- printed

## 2017-04-06 DIAGNOSIS — Z125 Encounter for screening for malignant neoplasm of prostate: Secondary | ICD-10-CM | POA: Diagnosis not present

## 2017-04-06 DIAGNOSIS — E1129 Type 2 diabetes mellitus with other diabetic kidney complication: Secondary | ICD-10-CM | POA: Diagnosis not present

## 2017-04-06 DIAGNOSIS — I10 Essential (primary) hypertension: Secondary | ICD-10-CM | POA: Diagnosis not present

## 2017-04-13 DIAGNOSIS — Z0000 Encounter for general adult medical examination without abnormal findings: Secondary | ICD-10-CM | POA: Diagnosis not present

## 2017-04-13 DIAGNOSIS — N183 Chronic kidney disease, stage 3 (moderate): Secondary | ICD-10-CM | POA: Diagnosis not present

## 2017-04-13 DIAGNOSIS — I129 Hypertensive chronic kidney disease with stage 1 through stage 4 chronic kidney disease, or unspecified chronic kidney disease: Secondary | ICD-10-CM | POA: Diagnosis not present

## 2017-04-13 DIAGNOSIS — Z7901 Long term (current) use of anticoagulants: Secondary | ICD-10-CM | POA: Diagnosis not present

## 2017-04-13 DIAGNOSIS — Z6824 Body mass index (BMI) 24.0-24.9, adult: Secondary | ICD-10-CM | POA: Diagnosis not present

## 2017-04-13 DIAGNOSIS — Z1389 Encounter for screening for other disorder: Secondary | ICD-10-CM | POA: Diagnosis not present

## 2017-04-13 DIAGNOSIS — E1151 Type 2 diabetes mellitus with diabetic peripheral angiopathy without gangrene: Secondary | ICD-10-CM | POA: Diagnosis not present

## 2017-04-13 DIAGNOSIS — F329 Major depressive disorder, single episode, unspecified: Secondary | ICD-10-CM | POA: Diagnosis not present

## 2017-04-13 DIAGNOSIS — E784 Other hyperlipidemia: Secondary | ICD-10-CM | POA: Diagnosis not present

## 2017-04-13 DIAGNOSIS — I10 Essential (primary) hypertension: Secondary | ICD-10-CM | POA: Diagnosis not present

## 2017-04-13 DIAGNOSIS — E11319 Type 2 diabetes mellitus with unspecified diabetic retinopathy without macular edema: Secondary | ICD-10-CM | POA: Diagnosis not present

## 2017-04-13 DIAGNOSIS — Z23 Encounter for immunization: Secondary | ICD-10-CM | POA: Diagnosis not present

## 2017-04-13 DIAGNOSIS — E1139 Type 2 diabetes mellitus with other diabetic ophthalmic complication: Secondary | ICD-10-CM | POA: Diagnosis not present

## 2017-04-13 DIAGNOSIS — E1129 Type 2 diabetes mellitus with other diabetic kidney complication: Secondary | ICD-10-CM | POA: Diagnosis not present

## 2017-04-22 DIAGNOSIS — Z1212 Encounter for screening for malignant neoplasm of rectum: Secondary | ICD-10-CM | POA: Diagnosis not present

## 2017-04-30 DIAGNOSIS — Z794 Long term (current) use of insulin: Secondary | ICD-10-CM | POA: Diagnosis not present

## 2017-04-30 DIAGNOSIS — I10 Essential (primary) hypertension: Secondary | ICD-10-CM | POA: Diagnosis not present

## 2017-04-30 DIAGNOSIS — E1129 Type 2 diabetes mellitus with other diabetic kidney complication: Secondary | ICD-10-CM | POA: Diagnosis not present

## 2017-04-30 DIAGNOSIS — N183 Chronic kidney disease, stage 3 (moderate): Secondary | ICD-10-CM | POA: Diagnosis not present

## 2017-05-29 ENCOUNTER — Ambulatory Visit: Payer: Medicare Other | Admitting: Podiatry

## 2017-06-03 ENCOUNTER — Ambulatory Visit (INDEPENDENT_AMBULATORY_CARE_PROVIDER_SITE_OTHER): Payer: Medicare Other | Admitting: Podiatry

## 2017-06-03 ENCOUNTER — Encounter: Payer: Self-pay | Admitting: Podiatry

## 2017-06-03 DIAGNOSIS — E119 Type 2 diabetes mellitus without complications: Secondary | ICD-10-CM | POA: Diagnosis not present

## 2017-06-03 DIAGNOSIS — B351 Tinea unguium: Secondary | ICD-10-CM

## 2017-06-03 DIAGNOSIS — M79676 Pain in unspecified toe(s): Secondary | ICD-10-CM

## 2017-06-03 DIAGNOSIS — D689 Coagulation defect, unspecified: Secondary | ICD-10-CM

## 2017-06-03 NOTE — Progress Notes (Signed)
Patient ID: Patrick Miranda, male   DOB: 29-Mar-1946, 71 y.o.   MRN: [id number] Complaint:  Visit Type: Patient returns to my office for continued preventative foot care services. Complaint: Patient states" my nails have grown long and thick and become painful to walk and wear shoes" Patient has been diagnosed with DM with no foot complications. The patient presents for preventative foot care services. No changes to ROS.  Patient is taking plavix.  Podiatric Exam: Vascular: dorsalis pedis and posterior tibial pulses are palpable bilateral. Capillary return is immediate. Temperature gradient is WNL. Skin turgor WNL  Sensorium: Normal Semmes Weinstein monofilament test. Normal tactile sensation bilaterally. Nail Exam: Pt has thick disfigured discolored nails with subungual debris noted bilateral entire nail hallux through fifth toenails except left hallux. Ulcer Exam: There is no evidence of ulcer or pre-ulcerative changes or infection. Orthopedic Exam: Muscle tone and strength are WNL. No limitations in general ROM. No crepitus or effusions noted. Foot type and digits show no abnormalities. HAV  B/L. Overlapping second toe left foot. Skin: No Porokeratosis. No infection or ulcers  Diagnosis:  Onychomycosis, , Pain in right toe, pain in left toes  Treatment & Plan Procedures and Treatment: Consent by patient was obtained for treatment procedures. The patient understood the discussion of treatment and procedures well. All questions were answered thoroughly reviewed. Debridement of mycotic and hypertrophic toenails, 1 through 5 bilateral and clearing of subungual debris. No ulceration, no infection noted. Patient is not interested in diabetic shoes. Return Visit-Office Procedure: Patient instructed to return to the office for a follow up visit 3 months for continued evaluation and treatment.    Gardiner Barefoot DPM

## 2017-06-08 DIAGNOSIS — H903 Sensorineural hearing loss, bilateral: Secondary | ICD-10-CM | POA: Diagnosis not present

## 2017-06-08 DIAGNOSIS — H9313 Tinnitus, bilateral: Secondary | ICD-10-CM | POA: Diagnosis not present

## 2017-06-08 DIAGNOSIS — Z570 Occupational exposure to noise: Secondary | ICD-10-CM | POA: Diagnosis not present

## 2017-07-10 ENCOUNTER — Ambulatory Visit (INDEPENDENT_AMBULATORY_CARE_PROVIDER_SITE_OTHER): Payer: Medicare Other | Admitting: Internal Medicine

## 2017-07-10 ENCOUNTER — Encounter: Payer: Self-pay | Admitting: Internal Medicine

## 2017-07-10 VITALS — BP 118/74 | HR 81 | Ht 70.5 in | Wt 174.0 lb

## 2017-07-10 DIAGNOSIS — G4733 Obstructive sleep apnea (adult) (pediatric): Secondary | ICD-10-CM | POA: Diagnosis not present

## 2017-07-10 DIAGNOSIS — J300 Vasomotor rhinitis: Secondary | ICD-10-CM | POA: Diagnosis not present

## 2017-07-10 MED ORDER — METHYLPHENIDATE HCL 5 MG PO TABS: 5.0000 mg | ORAL_TABLET | Freq: Two times a day (BID) | ORAL | 0 refills | Status: DC

## 2017-07-10 MED ORDER — IPRATROPIUM BROMIDE 0.03 % NA SOLN: NASAL | 12 refills | Status: DC

## 2017-07-10 NOTE — Patient Instructions (Addendum)
Script sent for ipratropium nasal spray    1-2 puffs each nostril every 8 hours, if needed. See if it helps with watery eyes and nose   Continue [facility] CPAP 8 cwp, mask of choice, humidifier, supplies, AirView    Dx OS                          It wold be good if they are able to replace stretched out old head gear now.  Script printed refilling ritalin  Please call if we can help

## 2017-07-10 NOTE — Progress Notes (Signed)
Subjective:    Patient ID: Patrick Miranda, male    DOB: 04-01-1946, 71 y.o.   MRN: [id number]  HPI M never smoker followed for OSA vasomotor rhinitis, complicated by history brainstem CVA, CAD, DM 2, HBP, anemia. NPSG 2007 AHI 31.3/ hr, CPAP to 9, weight 180 lbs.  ----------------------------------------------------------------------------------------  07/10/2016-71 year old male never smoker followed for OSA, Came by history brainstem CVA, CAD, DM 2, HBP, anemia CPAP 8/[facility] FOLLOWS FOR:[facility]. Pt wears CPAP every night for at least 4-6 hours; pressure working well. Ritalin rx needed.  Very comfortable with pressure but download shows residual AHI 7.0/hour with excellent compliance. Wife admits he snores through CPAP "sometimes". Ritalin works very well for occasional use such as at [employer].  07/10/17- 72 year old male never smoker followed for OSA, chronic rhinitis, complicated by hx Brainstem CVA, CAD, DM 2, HBP, anemia, CKD CPAP 10/[facility]  Ritalin 5 mg twice daily ---osa, cpap, [facility] He continues comfortable with CPAP and feels better using it.  Headgear is getting loose but he is not due for replacement until next month. Download 94% compliance, AHI 8.8/hour reflecting mostly residual central apneas..  Moderate leak He complains of intervals with watery eyes and nose.  We discussed trial of ipratropium nasal spray.  ROS-see HPI   + = positive Constitutional:    weight loss, night sweats, fevers, chills, + fatigue, lassitude. HEENT:    headaches, difficulty swallowing, tooth/dental problems, sore throat,       sneezing, itching, ear ache, nasal congestion, post nasal drip, snoring CV:    chest pain, orthopnea, PND, swelling in lower extremities, anasarca,                                                      dizziness, palpitations Resp:   shortness of breath with exertion or at rest.                productive cough,   non-productive cough, coughing up of blood.      change in color of mucus.  wheezing.   Skin:    rash or lesions. GI:  No-   heartburn, indigestion, abdominal pain, nausea, vomiting, GU:  MS:   joint pain, stiffness, Neuro-     nothing unusual Psych:  change in mood or affect.  depression or anxiety.   memory loss.      Objective:   Physical Exam OBJ- Physical Exam General- Alert, Oriented, Affect-appropriate, Distress- none acute, trim,  Skin- rash-none, lesions- none, excoriation- none Lymphadenopathy- none Head- atraumatic            Eyes- Gross vision intact, PERRLA, conjunctivae and secretions clear            Ears- Hearing, canals-normal            Nose- + small pressure irritation right nostril, no-Septal dev, mucus, polyps, erosion, perforation             Throat- Mallampati II-III , mucosa clear , drainage- none, tonsils- atrophic,hoarse Neck- flexible , trachea midline, no stridor , thyroid nl, carotid no bruit Chest - symmetrical excursion , unlabored           Heart/CV- RRR , no murmur , no gallop  , no rub, nl s1 s2                           -  JVD- none , edema- none, stasis changes- none, varices- none           Lung- clear to P&A, wheeze- none, cough-none , dullness-none, rub- none           Chest wall-  Abd-  Br/ Gen/ Rectal- Not done, not indicated Extrem- cyanosis- none, clubbing, none, atrophy- none, strength- nl Neuro- grossly intact to observation  Assessment & Plan:

## 2017-07-18 DIAGNOSIS — J300 Vasomotor rhinitis: Secondary | ICD-10-CM | POA: Insufficient documentation

## 2017-07-18 NOTE — Assessment & Plan Note (Signed)
Plan-ipratropium nasal spray

## 2017-07-18 NOTE — Assessment & Plan Note (Signed)
He remains committed to CPAP, sleeping much better with it. Plan-we discussed leak, compliance goals and comfort.  He can continue Ritalin for residual daytime somnolence which might partly be residual from his CVA. He will change mask for comfort when able.

## 2017-08-05 DIAGNOSIS — Z794 Long term (current) use of insulin: Secondary | ICD-10-CM | POA: Diagnosis not present

## 2017-08-05 DIAGNOSIS — E1129 Type 2 diabetes mellitus with other diabetic kidney complication: Secondary | ICD-10-CM | POA: Diagnosis not present

## 2017-08-05 DIAGNOSIS — Z6824 Body mass index (BMI) 24.0-24.9, adult: Secondary | ICD-10-CM | POA: Diagnosis not present

## 2017-08-05 DIAGNOSIS — N183 Chronic kidney disease, stage 3 (moderate): Secondary | ICD-10-CM | POA: Diagnosis not present

## 2017-08-05 DIAGNOSIS — I10 Essential (primary) hypertension: Secondary | ICD-10-CM | POA: Diagnosis not present

## 2017-09-02 ENCOUNTER — Ambulatory Visit: Payer: PRIVATE HEALTH INSURANCE | Admitting: Podiatry

## 2017-09-04 ENCOUNTER — Ambulatory Visit (INDEPENDENT_AMBULATORY_CARE_PROVIDER_SITE_OTHER): Payer: Medicare Other | Admitting: Podiatry

## 2017-09-04 ENCOUNTER — Encounter: Payer: Self-pay | Admitting: Podiatry

## 2017-09-04 DIAGNOSIS — B351 Tinea unguium: Secondary | ICD-10-CM | POA: Diagnosis not present

## 2017-09-04 DIAGNOSIS — D689 Coagulation defect, unspecified: Secondary | ICD-10-CM

## 2017-09-04 DIAGNOSIS — M79676 Pain in unspecified toe(s): Secondary | ICD-10-CM | POA: Diagnosis not present

## 2017-09-04 DIAGNOSIS — E119 Type 2 diabetes mellitus without complications: Secondary | ICD-10-CM

## 2017-09-04 NOTE — Progress Notes (Addendum)
Patient ID: Patrick Miranda, male   DOB: 04-Oct-1945, 72 y.o.   MRN: [id number] Complaint:  Visit Type: Patient returns to my office for continued preventative foot care services. Complaint: Patient states" my nails have grown long and thick and become painful to walk and wear shoes" Patient has been diagnosed with DM with no foot complications. The patient presents for preventative foot care services. No changes to ROS.  Patient is taking plavix.  Podiatric Exam: Vascular: dorsalis pedis and posterior tibial pulses are palpable bilateral. Capillary return is immediate. Temperature gradient is WNL. Skin turgor WNL  Sensorium: Normal Semmes Weinstein monofilament test. Normal tactile sensation bilaterally. Nail Exam: Pt has thick disfigured discolored nails with subungual debris noted bilateral entire nail hallux through fifth toenails except left hallux. Ulcer Exam: There is no evidence of ulcer or pre-ulcerative changes or infection. Orthopedic Exam: Muscle tone and strength are WNL. No limitations in general ROM. No crepitus or effusions noted. Foot type and digits show no abnormalities. HAV  B/L. Overlapping second toe left foot. Skin: No Porokeratosis. No infection or ulcers.  Asymptomatic pinch callus  B/L Diagnosis:  Onychomycosis, , Pain in right toe, pain in left toes  Treatment & Plan Procedures and Treatment: Consent by patient was obtained for treatment procedures. The patient understood the discussion of treatment and procedures well. All questions were answered thoroughly reviewed. Debridement of mycotic and hypertrophic toenails, 1 through 5 bilateral and clearing of subungual debris. No ulceration, no infection noted. Patient is not interested in diabetic shoes. ABN signed for 2019. Return Visit-Office Procedure: Patient instructed to return to the office for a follow up visit 3 months for continued evaluation and treatment.    Gardiner Barefoot DPM

## 2017-09-11 DIAGNOSIS — E119 Type 2 diabetes mellitus without complications: Secondary | ICD-10-CM | POA: Diagnosis not present

## 2017-09-11 DIAGNOSIS — H2513 Age-related nuclear cataract, bilateral: Secondary | ICD-10-CM | POA: Diagnosis not present

## 2017-11-04 DIAGNOSIS — E1129 Type 2 diabetes mellitus with other diabetic kidney complication: Secondary | ICD-10-CM | POA: Diagnosis not present

## 2017-11-04 DIAGNOSIS — I10 Essential (primary) hypertension: Secondary | ICD-10-CM | POA: Diagnosis not present

## 2017-11-04 DIAGNOSIS — N183 Chronic kidney disease, stage 3 (moderate): Secondary | ICD-10-CM | POA: Diagnosis not present

## 2017-11-04 DIAGNOSIS — Z6823 Body mass index (BMI) 23.0-23.9, adult: Secondary | ICD-10-CM | POA: Diagnosis not present

## 2017-11-04 DIAGNOSIS — Z794 Long term (current) use of insulin: Secondary | ICD-10-CM | POA: Diagnosis not present

## 2017-12-04 ENCOUNTER — Encounter: Payer: Self-pay | Admitting: Podiatry

## 2017-12-04 ENCOUNTER — Ambulatory Visit (INDEPENDENT_AMBULATORY_CARE_PROVIDER_SITE_OTHER): Payer: Medicare Other | Admitting: Podiatry

## 2017-12-04 DIAGNOSIS — M79676 Pain in unspecified toe(s): Secondary | ICD-10-CM

## 2017-12-04 DIAGNOSIS — B351 Tinea unguium: Secondary | ICD-10-CM | POA: Diagnosis not present

## 2017-12-04 DIAGNOSIS — D689 Coagulation defect, unspecified: Secondary | ICD-10-CM | POA: Diagnosis not present

## 2017-12-04 DIAGNOSIS — E119 Type 2 diabetes mellitus without complications: Secondary | ICD-10-CM | POA: Diagnosis not present

## 2017-12-04 NOTE — Progress Notes (Signed)
Patient ID: GABRIELL CASIMIR, male   DOB: 1946-04-20, 72 y.o.   MRN: [id number] Complaint:  Visit Type: Patient returns to my office for continued preventative foot care services. Complaint: Patient states" my nails have grown long and thick and become painful to walk and wear shoes" Patient has been diagnosed with DM with no foot complications. The patient presents for preventative foot care services. No changes to ROS.  Patient is taking plavix.  Podiatric Exam: Vascular: dorsalis pedis and posterior tibial pulses are palpable bilateral. Capillary return is immediate. Temperature gradient is WNL. Skin turgor WNL  Sensorium: Normal Semmes Weinstein monofilament test. Normal tactile sensation bilaterally. Nail Exam: Pt has thick disfigured discolored nails with subungual debris noted bilateral entire nail hallux through fifth toenails except left hallux. Ulcer Exam: There is no evidence of ulcer or pre-ulcerative changes or infection. Orthopedic Exam: Muscle tone and strength are WNL. No limitations in general ROM. No crepitus or effusions noted. Foot type and digits show no abnormalities. HAV  B/L. Overlapping second toe left foot. Skin: No Porokeratosis. No infection or ulcers.  Asymptomatic pinch callus  B/L Diagnosis:  Onychomycosis, , Pain in right toe, pain in left toes  Treatment & Plan Procedures and Treatment: Consent by patient was obtained for treatment procedures. The patient understood the discussion of treatment and procedures well. All questions were answered thoroughly reviewed. Debridement of mycotic and hypertrophic toenails, 1 through 5 bilateral and clearing of subungual debris. No ulceration, no infection noted. Patient is not interested in diabetic shoes. ABN signed for 2019. Return Visit-Office Procedure: Patient instructed to return to the office for a follow up visit 3 months for continued evaluation and treatment.    Gardiner Barefoot DPM

## 2018-02-03 DIAGNOSIS — Z6824 Body mass index (BMI) 24.0-24.9, adult: Secondary | ICD-10-CM | POA: Diagnosis not present

## 2018-02-03 DIAGNOSIS — N183 Chronic kidney disease, stage 3 (moderate): Secondary | ICD-10-CM | POA: Diagnosis not present

## 2018-02-03 DIAGNOSIS — I10 Essential (primary) hypertension: Secondary | ICD-10-CM | POA: Diagnosis not present

## 2018-02-03 DIAGNOSIS — E1129 Type 2 diabetes mellitus with other diabetic kidney complication: Secondary | ICD-10-CM | POA: Diagnosis not present

## 2018-02-03 DIAGNOSIS — Z794 Long term (current) use of insulin: Secondary | ICD-10-CM | POA: Diagnosis not present

## 2018-03-05 ENCOUNTER — Ambulatory Visit (INDEPENDENT_AMBULATORY_CARE_PROVIDER_SITE_OTHER): Payer: Medicare Other | Admitting: Podiatry

## 2018-03-05 ENCOUNTER — Encounter: Payer: Self-pay | Admitting: Podiatry

## 2018-03-05 DIAGNOSIS — M79676 Pain in unspecified toe(s): Secondary | ICD-10-CM | POA: Diagnosis not present

## 2018-03-05 DIAGNOSIS — B351 Tinea unguium: Secondary | ICD-10-CM

## 2018-03-05 DIAGNOSIS — D689 Coagulation defect, unspecified: Secondary | ICD-10-CM | POA: Diagnosis not present

## 2018-03-05 DIAGNOSIS — E119 Type 2 diabetes mellitus without complications: Secondary | ICD-10-CM | POA: Diagnosis not present

## 2018-03-05 NOTE — Progress Notes (Signed)
Patient ID: Patrick Miranda, male   DOB: 09/09/1945, 72 y.o.   MRN: [id number] Complaint:  Visit Type: Patient returns to my office for continued preventative foot care services. Complaint: Patient states" my nails have grown long and thick and become painful to walk and wear shoes" Patient has been diagnosed with DM with no foot complications. The patient presents for preventative foot care services. No changes to ROS.  Patient is taking plavix.  Podiatric Exam: Vascular: dorsalis pedis and posterior tibial pulses are palpable bilateral. Capillary return is immediate. Temperature gradient is WNL. Skin turgor WNL  Sensorium: Normal Semmes Weinstein monofilament test. Normal tactile sensation bilaterally. Nail Exam: Pt has thick disfigured discolored nails with subungual debris noted bilateral entire nail hallux through fifth toenails except left hallux. Ulcer Exam: There is no evidence of ulcer or pre-ulcerative changes or infection. Orthopedic Exam: Muscle tone and strength are WNL. No limitations in general ROM. No crepitus or effusions noted. Foot type and digits show no abnormalities. HAV  B/L. Overlapping second toe left foot. Skin: No Porokeratosis. No infection or ulcers.  Asymptomatic pinch callus  B/L Diagnosis:  Onychomycosis, , Pain in right toe, pain in left toes  Treatment & Plan Procedures and Treatment: Consent by patient was obtained for treatment procedures. The patient understood the discussion of treatment and procedures well. All questions were answered thoroughly reviewed. Debridement of mycotic and hypertrophic toenails, 1 through 5 bilateral and clearing of subungual debris. No ulceration, no infection noted. Patient is not interested in diabetic shoes. ABN signed for 2019. Return Visit-Office Procedure: Patient instructed to return to the office for a follow up visit 3 months for continued evaluation and treatment.    Raushanah Osmundson DPM 

## 2018-03-25 DIAGNOSIS — Z85828 Personal history of other malignant neoplasm of skin: Secondary | ICD-10-CM | POA: Diagnosis not present

## 2018-03-25 DIAGNOSIS — L570 Actinic keratosis: Secondary | ICD-10-CM | POA: Diagnosis not present

## 2018-03-25 DIAGNOSIS — D225 Melanocytic nevi of trunk: Secondary | ICD-10-CM | POA: Diagnosis not present

## 2018-03-25 DIAGNOSIS — L821 Other seborrheic keratosis: Secondary | ICD-10-CM | POA: Diagnosis not present

## 2018-03-25 DIAGNOSIS — L814 Other melanin hyperpigmentation: Secondary | ICD-10-CM | POA: Diagnosis not present

## 2018-03-25 DIAGNOSIS — D1801 Hemangioma of skin and subcutaneous tissue: Secondary | ICD-10-CM | POA: Diagnosis not present

## 2018-04-07 DIAGNOSIS — Z125 Encounter for screening for malignant neoplasm of prostate: Secondary | ICD-10-CM | POA: Diagnosis not present

## 2018-04-07 DIAGNOSIS — R82998 Other abnormal findings in urine: Secondary | ICD-10-CM | POA: Diagnosis not present

## 2018-04-07 DIAGNOSIS — E1129 Type 2 diabetes mellitus with other diabetic kidney complication: Secondary | ICD-10-CM | POA: Diagnosis not present

## 2018-04-07 DIAGNOSIS — I10 Essential (primary) hypertension: Secondary | ICD-10-CM | POA: Diagnosis not present

## 2018-04-07 DIAGNOSIS — E7849 Other hyperlipidemia: Secondary | ICD-10-CM | POA: Diagnosis not present

## 2018-04-14 DIAGNOSIS — I129 Hypertensive chronic kidney disease with stage 1 through stage 4 chronic kidney disease, or unspecified chronic kidney disease: Secondary | ICD-10-CM | POA: Diagnosis not present

## 2018-04-14 DIAGNOSIS — Z1389 Encounter for screening for other disorder: Secondary | ICD-10-CM | POA: Diagnosis not present

## 2018-04-14 DIAGNOSIS — E11319 Type 2 diabetes mellitus with unspecified diabetic retinopathy without macular edema: Secondary | ICD-10-CM | POA: Diagnosis not present

## 2018-04-14 DIAGNOSIS — E1139 Type 2 diabetes mellitus with other diabetic ophthalmic complication: Secondary | ICD-10-CM | POA: Diagnosis not present

## 2018-04-14 DIAGNOSIS — N183 Chronic kidney disease, stage 3 (moderate): Secondary | ICD-10-CM | POA: Diagnosis not present

## 2018-04-14 DIAGNOSIS — E1151 Type 2 diabetes mellitus with diabetic peripheral angiopathy without gangrene: Secondary | ICD-10-CM | POA: Diagnosis not present

## 2018-04-14 DIAGNOSIS — Z0000 Encounter for general adult medical examination without abnormal findings: Secondary | ICD-10-CM | POA: Diagnosis not present

## 2018-04-14 DIAGNOSIS — Z794 Long term (current) use of insulin: Secondary | ICD-10-CM | POA: Diagnosis not present

## 2018-04-14 DIAGNOSIS — Z23 Encounter for immunization: Secondary | ICD-10-CM | POA: Diagnosis not present

## 2018-04-14 DIAGNOSIS — E1129 Type 2 diabetes mellitus with other diabetic kidney complication: Secondary | ICD-10-CM | POA: Diagnosis not present

## 2018-04-14 DIAGNOSIS — I10 Essential (primary) hypertension: Secondary | ICD-10-CM | POA: Diagnosis not present

## 2018-04-14 DIAGNOSIS — E7849 Other hyperlipidemia: Secondary | ICD-10-CM | POA: Diagnosis not present

## 2018-05-06 DIAGNOSIS — K648 Other hemorrhoids: Secondary | ICD-10-CM | POA: Diagnosis not present

## 2018-05-06 DIAGNOSIS — Z86010 Personal history of colonic polyps: Secondary | ICD-10-CM | POA: Insufficient documentation

## 2018-05-06 DIAGNOSIS — Z1211 Encounter for screening for malignant neoplasm of colon: Secondary | ICD-10-CM | POA: Diagnosis not present

## 2018-05-26 DIAGNOSIS — I10 Essential (primary) hypertension: Secondary | ICD-10-CM | POA: Diagnosis not present

## 2018-05-26 DIAGNOSIS — Z794 Long term (current) use of insulin: Secondary | ICD-10-CM | POA: Diagnosis not present

## 2018-05-26 DIAGNOSIS — N183 Chronic kidney disease, stage 3 (moderate): Secondary | ICD-10-CM | POA: Diagnosis not present

## 2018-05-26 DIAGNOSIS — Z6824 Body mass index (BMI) 24.0-24.9, adult: Secondary | ICD-10-CM | POA: Diagnosis not present

## 2018-05-26 DIAGNOSIS — E1129 Type 2 diabetes mellitus with other diabetic kidney complication: Secondary | ICD-10-CM | POA: Diagnosis not present

## 2018-05-28 ENCOUNTER — Ambulatory Visit (INDEPENDENT_AMBULATORY_CARE_PROVIDER_SITE_OTHER): Payer: Medicare Other | Admitting: Podiatry

## 2018-05-28 ENCOUNTER — Encounter: Payer: Self-pay | Admitting: Podiatry

## 2018-05-28 DIAGNOSIS — M79676 Pain in unspecified toe(s): Secondary | ICD-10-CM | POA: Diagnosis not present

## 2018-05-28 DIAGNOSIS — D689 Coagulation defect, unspecified: Secondary | ICD-10-CM | POA: Diagnosis not present

## 2018-05-28 DIAGNOSIS — E119 Type 2 diabetes mellitus without complications: Secondary | ICD-10-CM | POA: Diagnosis not present

## 2018-05-28 DIAGNOSIS — B351 Tinea unguium: Secondary | ICD-10-CM

## 2018-05-28 NOTE — Progress Notes (Signed)
Patient ID: Patrick Miranda, male   DOB: [birth year], 72 y.o.   MRN: [id number] Complaint:  Visit Type: Patient returns to my office for continued preventative foot care services. Complaint: Patient states" my nails have grown long and thick and become painful to walk and wear shoes" Patient has been diagnosed with DM with no foot complications. The patient presents for preventative foot care services. No changes to ROS.  Patient is taking plavix.  Podiatric Exam: Vascular: dorsalis pedis and posterior tibial pulses are palpable bilateral. Capillary return is immediate. Temperature gradient is WNL. Skin turgor WNL  Sensorium: Normal Semmes Weinstein monofilament test. Normal tactile sensation bilaterally. Nail Exam: Pt has thick disfigured discolored nails with subungual debris noted bilateral entire nail hallux through fifth toenails except left hallux. Ulcer Exam: There is no evidence of ulcer or pre-ulcerative changes or infection. Orthopedic Exam: Muscle tone and strength are WNL. No limitations in general ROM. No crepitus or effusions noted. Foot type and digits show no abnormalities. HAV  B/L. Overlapping second toe left foot. Skin: No Porokeratosis. No infection or ulcers.  Asymptomatic pinch callus  B/L Diagnosis:  Onychomycosis, , Pain in right toe, pain in left toes  Treatment & Plan Procedures and Treatment: Consent by patient was obtained for treatment procedures. The patient understood the discussion of treatment and procedures well. All questions were answered thoroughly reviewed. Debridement of mycotic and hypertrophic toenails, 1 through 5 bilateral and clearing of subungual debris. No ulceration, no infection noted. Patient is not interested in diabetic shoes. ABN signed for 2019. Return Visit-Office Procedure: Patient instructed to return to the office for a follow up visit 3 months for continued evaluation and treatment.    Gardiner Barefoot DPM

## 2018-07-12 ENCOUNTER — Encounter: Payer: Self-pay | Admitting: Internal Medicine

## 2018-07-12 ENCOUNTER — Ambulatory Visit (INDEPENDENT_AMBULATORY_CARE_PROVIDER_SITE_OTHER): Payer: Medicare Other | Admitting: Internal Medicine

## 2018-07-12 VITALS — BP 118/66 | HR 65 | Ht 70.5 in | Wt 179.8 lb

## 2018-07-12 DIAGNOSIS — G4733 Obstructive sleep apnea (adult) (pediatric): Secondary | ICD-10-CM

## 2018-07-12 DIAGNOSIS — I10 Essential (primary) hypertension: Secondary | ICD-10-CM | POA: Diagnosis not present

## 2018-07-12 MED ORDER — METHYLPHENIDATE HCL 5 MG PO TABS: 5.0000 mg | ORAL_TABLET | Freq: Two times a day (BID) | ORAL | 0 refills | Status: DC

## 2018-07-12 NOTE — Patient Instructions (Signed)
Script printed refilling ritalin  Order- please change from [facility] to [place]- patient request Replace old CPAP machine, changing to auto 5-15, mask of choice, humidifier, supplies, AirView  Please call if we can help

## 2018-07-12 NOTE — Progress Notes (Signed)
Subjective:    Patient ID: Patrick Miranda, male    DOB: 1946/05/07, 72 y.o.   MRN: [id number]  HPI M never smoker followed for OSA vasomotor rhinitis, complicated by history brainstem CVA, CAD, DM 2, HBP, anemia. NPSG 2007 AHI 31.3/ hr, CPAP to 9, weight 180 lbs.  ---------------------------------------------------------------------------------------- 07/10/17- 72 year old male never smoker followed for OSA, chronic rhinitis, complicated by hx Brainstem CVA, CAD, DM 2, HBP, anemia, CKD CPAP 10/Advanced  Ritalin 5 mg twice daily ---osa, cpap, [facility] He continues comfortable with CPAP and feels better using it.  Headgear is getting loose but he is not due for replacement until next month. Download 94% compliance, AHI 8.8/hour reflecting mostly residual central apneas..  Moderate leak He complains of intervals with watery eyes and nose.  We discussed trial of ipratropium nasal spray.  07/12/2018- 72 year old male never smoker followed for OSA, chronic rhinitis, complicated by hx Brainstem CVA, CAD, DM 2, HBP, anemia, CKD CPAP 10/Advanced  Ritalin 5 mg twice daily>> today replace, changing to auto 5-15/ [place] 97% compliance AHI 9.9/hour with moderate leak. -----DME: Change from [facility] to [employer] as of today. Pt states he wears CPAP nightly and DL attached. Current machine is at least 73 years old.  ROS-see HPI   + = positive Constitutional:    weight loss, night sweats, fevers, chills, + fatigue, lassitude. HEENT:    headaches, difficulty swallowing, tooth/dental problems, sore throat,       sneezing, itching, ear ache, nasal congestion, post nasal drip, snoring CV:    chest pain, orthopnea, PND, swelling in lower extremities, anasarca,                                                      dizziness, palpitations Resp:   shortness of breath with exertion or at rest.                productive cough,   non-productive cough, coughing up of blood.            change in color of mucus.  wheezing.   Skin:    rash or lesions. GI:  No-   heartburn, indigestion, abdominal pain, nausea, vomiting, GU:  MS:   joint pain, stiffness, Neuro-     nothing unusual Psych:  change in mood or affect.  depression or anxiety.   memory loss.      Objective:   Physical Exam OBJ- Physical Exam General- Alert, Oriented, Affect-appropriate, Distress- none acute, trim,  Skin- rash-none, lesions- none, excoriation- none Lymphadenopathy- none Head- atraumatic            Eyes- Gross vision intact, PERRLA, conjunctivae and secretions clear            Ears- Hearing, canals-normal            Nose- + small pressure irritation right nostril, no-Septal dev, mucus, polyps, erosion, perforation             Throat- Mallampati II-III , mucosa clear , drainage- none, tonsils- atrophic,hoarse Neck- flexible , trachea midline, no stridor , thyroid nl, carotid no bruit Chest - symmetrical excursion , unlabored           Heart/CV- RRR , no murmur , no gallop  , no rub, nl s1 s2                           -  JVD- none , edema- none, stasis changes- none, varices- none           Lung- clear to P&A, wheeze- none, cough-none , dullness-none, rub- none           Chest wall-  Abd-  Br/ Gen/ Rectal- Not done, not indicated Extrem- cyanosis- none, clubbing, none, atrophy- none, strength- nl Neuro- grossly intact to observation  Assessment & Plan:

## 2018-07-19 ENCOUNTER — Telehealth: Payer: Self-pay | Admitting: Internal Medicine

## 2018-07-19 NOTE — Telephone Encounter (Signed)
CY Please complete note so we can fax to [facility] for patient to get supplies. Thanks.

## 2018-07-20 NOTE — Telephone Encounter (Signed)
Patient's wife calling for status, advised nurse is working on this.  She states this will need to be faxed to attn: Respiratory Therapy, [phone].  Please call patient's wife once this has been completed at [phone].

## 2018-07-20 NOTE — Telephone Encounter (Signed)
CY please advise once this has been signed so we can fax this over, thank you.

## 2018-07-20 NOTE — Assessment & Plan Note (Signed)
He continues to benefit from CPAP with excellent compliance confirmed by wife and download.  Residual AHI of 9.9 is a little higher than we would like.  His machine is old and he is wanting to change DME companies so we can take the opportunity to change to AutoPap 5-15. Residual sleepiness still benefits from use of Ritalin.

## 2018-07-20 NOTE — Assessment & Plan Note (Signed)
His BP and history of stroke are being followed elsewhere.  There does not seem to be concerned with his Ritalin use.

## 2018-07-20 NOTE — Telephone Encounter (Signed)
Done

## 2018-07-22 NOTE — Telephone Encounter (Signed)
Patrick Miranda was this document given to you?

## 2018-07-22 NOTE — Telephone Encounter (Signed)
No document was sent by [facility]; they were needing signed/completed notes from Dr Bertrum Sol most recent visit for patient. I have printed them off and faxed as requested. Nothing more needed.

## 2018-08-18 DIAGNOSIS — E1139 Type 2 diabetes mellitus with other diabetic ophthalmic complication: Secondary | ICD-10-CM | POA: Diagnosis not present

## 2018-08-18 DIAGNOSIS — G463 Brain stem stroke syndrome: Secondary | ICD-10-CM | POA: Diagnosis not present

## 2018-08-18 DIAGNOSIS — E663 Overweight: Secondary | ICD-10-CM | POA: Diagnosis not present

## 2018-08-18 DIAGNOSIS — I10 Essential (primary) hypertension: Secondary | ICD-10-CM | POA: Diagnosis not present

## 2018-08-18 DIAGNOSIS — E11319 Type 2 diabetes mellitus with unspecified diabetic retinopathy without macular edema: Secondary | ICD-10-CM | POA: Diagnosis not present

## 2018-08-18 DIAGNOSIS — M62838 Other muscle spasm: Secondary | ICD-10-CM | POA: Diagnosis not present

## 2018-08-18 DIAGNOSIS — E1151 Type 2 diabetes mellitus with diabetic peripheral angiopathy without gangrene: Secondary | ICD-10-CM | POA: Diagnosis not present

## 2018-08-18 DIAGNOSIS — I129 Hypertensive chronic kidney disease with stage 1 through stage 4 chronic kidney disease, or unspecified chronic kidney disease: Secondary | ICD-10-CM | POA: Diagnosis not present

## 2018-08-18 DIAGNOSIS — E7849 Other hyperlipidemia: Secondary | ICD-10-CM | POA: Diagnosis not present

## 2018-08-18 DIAGNOSIS — E1129 Type 2 diabetes mellitus with other diabetic kidney complication: Secondary | ICD-10-CM | POA: Diagnosis not present

## 2018-08-18 DIAGNOSIS — Z794 Long term (current) use of insulin: Secondary | ICD-10-CM | POA: Diagnosis not present

## 2018-08-27 ENCOUNTER — Ambulatory Visit: Payer: PRIVATE HEALTH INSURANCE | Admitting: Podiatry

## 2018-08-28 ENCOUNTER — Encounter: Payer: Self-pay | Admitting: Podiatry

## 2018-08-28 ENCOUNTER — Ambulatory Visit (INDEPENDENT_AMBULATORY_CARE_PROVIDER_SITE_OTHER): Payer: Medicare Other | Admitting: Podiatry

## 2018-08-28 DIAGNOSIS — E119 Type 2 diabetes mellitus without complications: Secondary | ICD-10-CM

## 2018-08-28 DIAGNOSIS — M79676 Pain in unspecified toe(s): Secondary | ICD-10-CM

## 2018-08-28 DIAGNOSIS — D689 Coagulation defect, unspecified: Secondary | ICD-10-CM | POA: Diagnosis not present

## 2018-08-28 DIAGNOSIS — B351 Tinea unguium: Secondary | ICD-10-CM

## 2018-08-28 NOTE — Progress Notes (Signed)
Patient ID: Patrick Miranda, male   DOB: Mar 08, 1946, 73 y.o.   MRN: [id number] Complaint:  Visit Type: Patient returns to my office for continued preventative foot care services. Complaint: Patient states" my nails have grown long and thick and become painful to walk and wear shoes" Patient has been diagnosed with DM with no foot complications. The patient presents for preventative foot care services. No changes to ROS.  Patient is taking plavix.  Podiatric Exam: Vascular: dorsalis pedis and posterior tibial pulses are palpable bilateral. Capillary return is immediate. Temperature gradient is WNL. Skin turgor WNL  Sensorium: Normal Semmes Weinstein monofilament test. Normal tactile sensation bilaterally. Nail Exam: Pt has thick disfigured discolored nails with subungual debris noted bilateral entire nail hallux through fifth toenails except left hallux. Ulcer Exam: There is no evidence of ulcer or pre-ulcerative changes or infection. Orthopedic Exam: Muscle tone and strength are WNL. No limitations in general ROM. No crepitus or effusions noted. Foot type and digits show no abnormalities. HAV  B/L. Overlapping second toe left foot. Skin: No Porokeratosis. No infection or ulcers.  Asymptomatic pinch callus  B/L Diagnosis:  Onychomycosis, , Pain in right toe, pain in left toes  Treatment & Plan Procedures and Treatment: Consent by patient was obtained for treatment procedures. The patient understood the discussion of treatment and procedures well. All questions were answered thoroughly reviewed. Debridement of mycotic and hypertrophic toenails, 1 through 5 bilateral and clearing of subungual debris. No ulceration, no infection noted.  Return Visit-Office Procedure: Patient instructed to return to the office for a follow up visit 3 months for continued evaluation and treatment.    Gardiner Barefoot DPM

## 2018-09-05 DIAGNOSIS — R509 Fever, unspecified: Secondary | ICD-10-CM | POA: Diagnosis not present

## 2018-09-05 DIAGNOSIS — R05 Cough: Secondary | ICD-10-CM | POA: Diagnosis not present

## 2018-09-05 DIAGNOSIS — J101 Influenza due to other identified influenza virus with other respiratory manifestations: Secondary | ICD-10-CM | POA: Diagnosis not present

## 2018-09-17 DIAGNOSIS — I129 Hypertensive chronic kidney disease with stage 1 through stage 4 chronic kidney disease, or unspecified chronic kidney disease: Secondary | ICD-10-CM | POA: Diagnosis not present

## 2018-09-17 DIAGNOSIS — M62838 Other muscle spasm: Secondary | ICD-10-CM | POA: Diagnosis not present

## 2018-09-17 DIAGNOSIS — I10 Essential (primary) hypertension: Secondary | ICD-10-CM | POA: Diagnosis not present

## 2018-09-17 DIAGNOSIS — E1139 Type 2 diabetes mellitus with other diabetic ophthalmic complication: Secondary | ICD-10-CM | POA: Diagnosis not present

## 2018-09-17 DIAGNOSIS — E1129 Type 2 diabetes mellitus with other diabetic kidney complication: Secondary | ICD-10-CM | POA: Diagnosis not present

## 2018-09-17 DIAGNOSIS — E7849 Other hyperlipidemia: Secondary | ICD-10-CM | POA: Diagnosis not present

## 2018-09-17 DIAGNOSIS — Z794 Long term (current) use of insulin: Secondary | ICD-10-CM | POA: Diagnosis not present

## 2018-09-17 DIAGNOSIS — G463 Brain stem stroke syndrome: Secondary | ICD-10-CM | POA: Diagnosis not present

## 2018-09-17 DIAGNOSIS — D6489 Other specified anemias: Secondary | ICD-10-CM | POA: Diagnosis not present

## 2018-09-17 DIAGNOSIS — N183 Chronic kidney disease, stage 3 (moderate): Secondary | ICD-10-CM | POA: Diagnosis not present

## 2018-09-17 DIAGNOSIS — E1151 Type 2 diabetes mellitus with diabetic peripheral angiopathy without gangrene: Secondary | ICD-10-CM | POA: Diagnosis not present

## 2018-09-17 DIAGNOSIS — E11319 Type 2 diabetes mellitus with unspecified diabetic retinopathy without macular edema: Secondary | ICD-10-CM | POA: Diagnosis not present

## 2018-09-23 DIAGNOSIS — N183 Chronic kidney disease, stage 3 (moderate): Secondary | ICD-10-CM | POA: Diagnosis not present

## 2018-09-23 DIAGNOSIS — Z794 Long term (current) use of insulin: Secondary | ICD-10-CM | POA: Diagnosis not present

## 2018-09-23 DIAGNOSIS — E1151 Type 2 diabetes mellitus with diabetic peripheral angiopathy without gangrene: Secondary | ICD-10-CM | POA: Diagnosis not present

## 2018-09-23 DIAGNOSIS — I10 Essential (primary) hypertension: Secondary | ICD-10-CM | POA: Diagnosis not present

## 2018-11-26 ENCOUNTER — Other Ambulatory Visit: Payer: Self-pay

## 2018-11-26 ENCOUNTER — Encounter: Payer: Self-pay | Admitting: Podiatry

## 2018-11-26 ENCOUNTER — Ambulatory Visit (INDEPENDENT_AMBULATORY_CARE_PROVIDER_SITE_OTHER): Payer: Medicare Other | Admitting: Podiatry

## 2018-11-26 VITALS — Temp 97.3°F

## 2018-11-26 DIAGNOSIS — M79676 Pain in unspecified toe(s): Secondary | ICD-10-CM

## 2018-11-26 DIAGNOSIS — E119 Type 2 diabetes mellitus without complications: Secondary | ICD-10-CM

## 2018-11-26 DIAGNOSIS — B351 Tinea unguium: Secondary | ICD-10-CM

## 2018-11-26 DIAGNOSIS — D689 Coagulation defect, unspecified: Secondary | ICD-10-CM

## 2018-11-26 NOTE — Progress Notes (Signed)
Patient ID: Patrick Miranda, male   DOB: [birth year], 73 y.o.   MRN: [id number] Complaint:  Visit Type: Patient returns to my office for continued preventative foot care services. Complaint: Patient states" my nails have grown long and thick and become painful to walk and wear shoes" Patient has been diagnosed with DM with no foot complications. The patient presents for preventative foot care services. No changes to ROS.  Patient is taking plavix.  Podiatric Exam: Vascular: dorsalis pedis and posterior tibial pulses are palpable bilateral. Capillary return is immediate. Temperature gradient is WNL. Skin turgor WNL  Sensorium: Normal Semmes Weinstein monofilament test. Normal tactile sensation bilaterally. Nail Exam: Pt has thick disfigured discolored nails with subungual debris noted bilateral entire nail hallux through fifth toenails except left hallux. Ulcer Exam: There is no evidence of ulcer or pre-ulcerative changes or infection. Orthopedic Exam: Muscle tone and strength are WNL. No limitations in general ROM. No crepitus or effusions noted. Foot type and digits show no abnormalities. HAV  B/L. Overlapping second toe left foot. Skin: No Porokeratosis. No infection or ulcers.  Asymptomatic pinch callus  B/L Diagnosis:  Onychomycosis, , Pain in right toe, pain in left toes  Treatment & Plan Procedures and Treatment: Consent by patient was obtained for treatment procedures. The patient understood the discussion of treatment and procedures well. All questions were answered thoroughly reviewed. Debridement of mycotic and hypertrophic toenails x 9.   No ulceration, no infection noted.  Return Visit-Office Procedure: Patient instructed to return to the office for a follow up visit 3 months for continued evaluation and treatment.    Gardiner Barefoot DPM

## 2018-12-22 DIAGNOSIS — Z794 Long term (current) use of insulin: Secondary | ICD-10-CM | POA: Diagnosis not present

## 2018-12-22 DIAGNOSIS — N183 Chronic kidney disease, stage 3 (moderate): Secondary | ICD-10-CM | POA: Diagnosis not present

## 2018-12-22 DIAGNOSIS — E1151 Type 2 diabetes mellitus with diabetic peripheral angiopathy without gangrene: Secondary | ICD-10-CM | POA: Diagnosis not present

## 2018-12-22 DIAGNOSIS — I10 Essential (primary) hypertension: Secondary | ICD-10-CM | POA: Diagnosis not present

## 2019-02-08 DIAGNOSIS — E119 Type 2 diabetes mellitus without complications: Secondary | ICD-10-CM | POA: Diagnosis not present

## 2019-02-08 DIAGNOSIS — H524 Presbyopia: Secondary | ICD-10-CM | POA: Diagnosis not present

## 2019-02-08 DIAGNOSIS — H2513 Age-related nuclear cataract, bilateral: Secondary | ICD-10-CM | POA: Diagnosis not present

## 2019-03-02 ENCOUNTER — Ambulatory Visit (INDEPENDENT_AMBULATORY_CARE_PROVIDER_SITE_OTHER): Payer: Medicare Other | Admitting: Podiatry

## 2019-03-02 ENCOUNTER — Other Ambulatory Visit: Payer: Self-pay

## 2019-03-02 ENCOUNTER — Encounter: Payer: Self-pay | Admitting: Podiatry

## 2019-03-02 DIAGNOSIS — E119 Type 2 diabetes mellitus without complications: Secondary | ICD-10-CM

## 2019-03-02 DIAGNOSIS — B351 Tinea unguium: Secondary | ICD-10-CM

## 2019-03-02 DIAGNOSIS — D689 Coagulation defect, unspecified: Secondary | ICD-10-CM

## 2019-03-02 DIAGNOSIS — M79676 Pain in unspecified toe(s): Secondary | ICD-10-CM | POA: Diagnosis not present

## 2019-03-02 NOTE — Progress Notes (Signed)
Patient ID: Patrick Miranda, male   DOB: 1945-12-14, 73 y.o.   MRN: [id number] Complaint:  Visit Type: Patient returns to my office for continued preventative foot care services. Complaint: Patient states" my nails have grown long and thick and become painful to walk and wear shoes" Patient has been diagnosed with DM with no foot complications. The patient presents for preventative foot care services. No changes to ROS.  Patient is taking plavix.  Podiatric Exam: Vascular: dorsalis pedis and posterior tibial pulses are palpable bilateral. Capillary return is immediate. Temperature gradient is WNL. Skin turgor WNL  Sensorium: Normal Semmes Weinstein monofilament test. Normal tactile sensation bilaterally. Nail Exam: Pt has thick disfigured discolored nails with subungual debris noted bilateral entire nail hallux through fifth toenails except left hallux. Ulcer Exam: There is no evidence of ulcer or pre-ulcerative changes or infection. Orthopedic Exam: Muscle tone and strength are WNL. No limitations in general ROM. No crepitus or effusions noted. Foot type and digits show no abnormalities. HAV  B/L. Overlapping second toe left foot. Skin: No Porokeratosis. No infection or ulcers.  Asymptomatic pinch callus  B/L Diagnosis:  Onychomycosis, , Pain in right toe, pain in left toes  Treatment & Plan Procedures and Treatment: Consent by patient was obtained for treatment procedures. The patient understood the discussion of treatment and procedures well. All questions were answered thoroughly reviewed. Debridement of mycotic and hypertrophic toenails x 9.   No ulceration, no infection noted.  Return Visit-Office Procedure: Patient instructed to return to the office for a follow up visit 3 months for continued evaluation and treatment.    Gardiner Barefoot DPM

## 2019-03-23 DIAGNOSIS — I10 Essential (primary) hypertension: Secondary | ICD-10-CM | POA: Diagnosis not present

## 2019-03-23 DIAGNOSIS — Z794 Long term (current) use of insulin: Secondary | ICD-10-CM | POA: Diagnosis not present

## 2019-03-23 DIAGNOSIS — E1151 Type 2 diabetes mellitus with diabetic peripheral angiopathy without gangrene: Secondary | ICD-10-CM | POA: Diagnosis not present

## 2019-03-23 DIAGNOSIS — Z23 Encounter for immunization: Secondary | ICD-10-CM | POA: Diagnosis not present

## 2019-03-23 DIAGNOSIS — I129 Hypertensive chronic kidney disease with stage 1 through stage 4 chronic kidney disease, or unspecified chronic kidney disease: Secondary | ICD-10-CM | POA: Diagnosis not present

## 2019-03-23 DIAGNOSIS — N183 Chronic kidney disease, stage 3 (moderate): Secondary | ICD-10-CM | POA: Diagnosis not present

## 2019-03-23 DIAGNOSIS — E7849 Other hyperlipidemia: Secondary | ICD-10-CM | POA: Diagnosis not present

## 2019-03-31 DIAGNOSIS — L570 Actinic keratosis: Secondary | ICD-10-CM | POA: Diagnosis not present

## 2019-03-31 DIAGNOSIS — L821 Other seborrheic keratosis: Secondary | ICD-10-CM | POA: Diagnosis not present

## 2019-04-18 DIAGNOSIS — E11319 Type 2 diabetes mellitus with unspecified diabetic retinopathy without macular edema: Secondary | ICD-10-CM | POA: Diagnosis not present

## 2019-04-18 DIAGNOSIS — G463 Brain stem stroke syndrome: Secondary | ICD-10-CM | POA: Diagnosis not present

## 2019-04-18 DIAGNOSIS — E1151 Type 2 diabetes mellitus with diabetic peripheral angiopathy without gangrene: Secondary | ICD-10-CM | POA: Diagnosis not present

## 2019-04-18 DIAGNOSIS — Z0000 Encounter for general adult medical examination without abnormal findings: Secondary | ICD-10-CM | POA: Diagnosis not present

## 2019-04-18 DIAGNOSIS — E1139 Type 2 diabetes mellitus with other diabetic ophthalmic complication: Secondary | ICD-10-CM | POA: Diagnosis not present

## 2019-04-18 DIAGNOSIS — E785 Hyperlipidemia, unspecified: Secondary | ICD-10-CM | POA: Diagnosis not present

## 2019-04-18 DIAGNOSIS — Z794 Long term (current) use of insulin: Secondary | ICD-10-CM | POA: Diagnosis not present

## 2019-04-18 DIAGNOSIS — N183 Chronic kidney disease, stage 3 (moderate): Secondary | ICD-10-CM | POA: Diagnosis not present

## 2019-04-18 DIAGNOSIS — E1129 Type 2 diabetes mellitus with other diabetic kidney complication: Secondary | ICD-10-CM | POA: Diagnosis not present

## 2019-04-18 DIAGNOSIS — Z125 Encounter for screening for malignant neoplasm of prostate: Secondary | ICD-10-CM | POA: Diagnosis not present

## 2019-04-18 DIAGNOSIS — I10 Essential (primary) hypertension: Secondary | ICD-10-CM | POA: Diagnosis not present

## 2019-04-18 DIAGNOSIS — I129 Hypertensive chronic kidney disease with stage 1 through stage 4 chronic kidney disease, or unspecified chronic kidney disease: Secondary | ICD-10-CM | POA: Diagnosis not present

## 2019-06-01 ENCOUNTER — Encounter: Payer: Self-pay | Admitting: Podiatry

## 2019-06-01 ENCOUNTER — Ambulatory Visit (INDEPENDENT_AMBULATORY_CARE_PROVIDER_SITE_OTHER): Payer: Medicare Other | Admitting: Podiatry

## 2019-06-01 ENCOUNTER — Other Ambulatory Visit: Payer: Self-pay

## 2019-06-01 DIAGNOSIS — B351 Tinea unguium: Secondary | ICD-10-CM

## 2019-06-01 DIAGNOSIS — M79676 Pain in unspecified toe(s): Secondary | ICD-10-CM

## 2019-06-01 DIAGNOSIS — E119 Type 2 diabetes mellitus without complications: Secondary | ICD-10-CM

## 2019-06-01 DIAGNOSIS — D689 Coagulation defect, unspecified: Secondary | ICD-10-CM | POA: Diagnosis not present

## 2019-06-01 NOTE — Progress Notes (Signed)
Patient ID: Patrick Miranda, male   DOB: 1946/04/01, 73 y.o.   MRN: [id number] Complaint:  Visit Type: Patient returns to my office for continued preventative foot care services. Complaint: Patient states" my nails have grown long and thick and become painful to walk and wear shoes" Patient has been diagnosed with DM with no foot complications. The patient presents for preventative foot care services. No changes to ROS.  Patient is taking plavix.  Podiatric Exam: Vascular: dorsalis pedis and posterior tibial pulses are palpable bilateral. Capillary return is immediate. Temperature gradient is WNL. Skin turgor WNL  Sensorium: Normal Semmes Weinstein monofilament test. Normal tactile sensation bilaterally. Nail Exam: Pt has thick disfigured discolored nails with subungual debris noted bilateral entire nail hallux through fifth toenails except left hallux. Ulcer Exam: There is no evidence of ulcer or pre-ulcerative changes or infection. Orthopedic Exam: Muscle tone and strength are WNL. No limitations in general ROM. No crepitus or effusions noted. Foot type and digits show no abnormalities. HAV  B/L. Overlapping second toe left foot. Skin: No Porokeratosis. No infection or ulcers.  Asymptomatic pinch callus  B/L Diagnosis:  Onychomycosis, , Pain in right toe, pain in left toes  Treatment & Plan Procedures and Treatment: Consent by patient was obtained for treatment procedures. The patient understood the discussion of treatment and procedures well. All questions were answered thoroughly reviewed. Debridement of mycotic and hypertrophic toenails x 9.   No ulceration, no infection noted.  Return Visit-Office Procedure: Patient instructed to return to the office for a follow up visit 3 months for continued evaluation and treatment.    Gardiner Barefoot DPM

## 2019-06-28 DIAGNOSIS — E1151 Type 2 diabetes mellitus with diabetic peripheral angiopathy without gangrene: Secondary | ICD-10-CM | POA: Diagnosis not present

## 2019-06-28 DIAGNOSIS — I129 Hypertensive chronic kidney disease with stage 1 through stage 4 chronic kidney disease, or unspecified chronic kidney disease: Secondary | ICD-10-CM | POA: Diagnosis not present

## 2019-06-28 DIAGNOSIS — Z794 Long term (current) use of insulin: Secondary | ICD-10-CM | POA: Diagnosis not present

## 2019-06-28 DIAGNOSIS — N1831 Chronic kidney disease, stage 3a: Secondary | ICD-10-CM | POA: Diagnosis not present

## 2019-07-11 ENCOUNTER — Encounter: Payer: Self-pay | Admitting: Internal Medicine

## 2019-07-13 ENCOUNTER — Encounter: Payer: Self-pay | Admitting: Internal Medicine

## 2019-07-13 ENCOUNTER — Ambulatory Visit: Payer: Medicare Other | Attending: Internal Medicine

## 2019-07-13 ENCOUNTER — Other Ambulatory Visit: Payer: Self-pay

## 2019-07-13 ENCOUNTER — Ambulatory Visit (INDEPENDENT_AMBULATORY_CARE_PROVIDER_SITE_OTHER): Payer: Medicare Other | Admitting: Internal Medicine

## 2019-07-13 DIAGNOSIS — G4710 Hypersomnia, unspecified: Secondary | ICD-10-CM | POA: Diagnosis not present

## 2019-07-13 DIAGNOSIS — Z9989 Dependence on other enabling machines and devices: Secondary | ICD-10-CM

## 2019-07-13 DIAGNOSIS — Z20822 Contact with and (suspected) exposure to covid-19: Secondary | ICD-10-CM

## 2019-07-13 DIAGNOSIS — G4733 Obstructive sleep apnea (adult) (pediatric): Secondary | ICD-10-CM | POA: Diagnosis not present

## 2019-07-13 DIAGNOSIS — J300 Vasomotor rhinitis: Secondary | ICD-10-CM

## 2019-07-13 MED ORDER — METHYLPHENIDATE HCL 5 MG PO TABS: 5.0000 mg | ORAL_TABLET | Freq: Two times a day (BID) | ORAL | 0 refills | Status: DC

## 2019-07-13 NOTE — Progress Notes (Signed)
Subjective:    Patient ID: Patrick Miranda, male    DOB: Apr 16, 1946, 73 y.o.   MRN: [id number]  HPI M never smoker followed for OSA vasomotor rhinitis, complicated by history brainstem CVA, CAD, DM 2, HBP, anemia. NPSG 2007 AHI 31.3/ hr, CPAP to 9, weight 180 lbs.  ----------------------------------------------------------------------------------------  07/12/2018- 73 year old male never smoker followed for OSA, chronic rhinitis, complicated by hx Brainstem CVA, CAD, DM 2, HBP, anemia, CKD CPAP 10/Advanced  Ritalin 5 mg twice daily>> today replace, changing to auto 5-15/ [place] 97% compliance AHI 9.9/hour with moderate leak. -----DME: Change from [facility] to [employer] as of today. Pt states he wears CPAP nightly and DL attached. Current machine is at least 75 years old.  07/13/2019- Virtual Visit via Telephone Note  I connected with [place] on 07/13/19 at  9:00 AM EST by telephone and verified that I am speaking with the correct person using two identifiers.  Location: Patient: H Provider: O   I discussed the limitations, risks, security and privacy concerns of performing an evaluation and management service by telephone and the availability of in person appointments. I also discussed with the patient that there may be a patient responsible charge related to this service. The patient expressed understanding and agreed to proceed.   History of Present Illness:  73 year old male never smoker followed for OSA, chronic rhinitis, complicated by hx Brainstem CVA, CAD, DM 2, HBP, anemia, CKD CPAP auto 5-15/ [place] 97%, AHI 6.6/ hr Ritalin 5 mg BID PRN Had flu vax Asks pressure change Using ritalin only on Sunday for church now   Observations/Objective:   Assessment and Plan: OSA benefits. Try reducing range to 7-13 for comfort Hypersomnia- ok to use ritalin and nap when needed, safe driving Rhinitis is  currently quiescent  Follow Up Instructions: 1 year   I discussed the assessment and treatment plan with the patient. The patient was provided an opportunity to ask questions and all were answered. The patient agreed with the plan and demonstrated an understanding of the instructions.   The patient was advised to call back or seek an in-person evaluation if the symptoms worsen or if the condition fails to improve as anticipated.  I provided 21 minutes of non-face-to-face time during this encounter.   Baird Lyons, MD  ---------------- ROS-see HPI   + = positive Constitutional:    weight loss, night sweats, fevers, chills, + fatigue, lassitude. HEENT:    headaches, difficulty swallowing, tooth/dental problems, sore throat,       sneezing, itching, ear ache, nasal congestion, post nasal drip, snoring CV:    chest pain, orthopnea, PND, swelling in lower extremities, anasarca,                                                      dizziness, palpitations Resp:   shortness of breath with exertion or at rest.                productive cough,   non-productive cough, coughing up of blood.              change in color of mucus.  wheezing.   Skin:    rash or lesions. GI:  No-   heartburn, indigestion, abdominal pain, nausea, vomiting, GU:  MS:   joint pain,  stiffness, Neuro-     nothing unusual Psych:  change in mood or affect.  depression or anxiety.   memory loss.      Objective:   Physical Exam OBJ- Physical Exam General- Alert, Oriented, Affect-appropriate, Distress- none acute, trim,  Skin- rash-none, lesions- none, excoriation- none Lymphadenopathy- none Head- atraumatic            Eyes- Gross vision intact, PERRLA, conjunctivae and secretions clear            Ears- Hearing, canals-normal            Nose- + small pressure irritation right nostril, no-Septal dev, mucus, polyps, erosion, perforation             Throat- Mallampati II-III , mucosa clear , drainage- none, tonsils-  atrophic,hoarse Neck- flexible , trachea midline, no stridor , thyroid nl, carotid no bruit Chest - symmetrical excursion , unlabored           Heart/CV- RRR , no murmur , no gallop  , no rub, nl s1 s2                           - JVD- none , edema- none, stasis changes- none, varices- none           Lung- clear to P&A, wheeze- none, cough-none , dullness-none, rub- none           Chest wall-  Abd-  Br/ Gen/ Rectal- Not done, not indicated Extrem- cyanosis- none, clubbing, none, atrophy- none, strength- nl Neuro- grossly intact to observation  Assessment & Plan:

## 2019-07-13 NOTE — Patient Instructions (Signed)
Ritalin refill e-sent to [facility]- please change CPAP auto range to 7-13, continue mask of choice, humidifier, supplies, AirView/ card  Please call us if we can help, and I hope you have a very Merry Christmas

## 2019-07-14 ENCOUNTER — Telehealth: Payer: Self-pay

## 2019-07-14 LAB — NOVEL CORONAVIRUS, NAA: SARS-CoV-2, NAA: NOT DETECTED

## 2019-07-14 NOTE — Telephone Encounter (Signed)
Caller advise result not back yet 

## 2019-08-30 ENCOUNTER — Encounter: Payer: Self-pay | Admitting: Internal Medicine

## 2019-08-30 DIAGNOSIS — G4710 Hypersomnia, unspecified: Secondary | ICD-10-CM | POA: Insufficient documentation

## 2019-08-30 NOTE — Assessment & Plan Note (Signed)
CVA hx may have bearing, in addition to his OSA. Doing much better now. Ok to use ritalin sparingly, nap if needed, safe driving

## 2019-08-30 NOTE — Assessment & Plan Note (Signed)
Benefits with good compliance Ok to narrow range of his autopap for comfort trial as he asks> auto 7-13

## 2019-08-30 NOTE — Assessment & Plan Note (Signed)
Flonase, occasional Afrin if needed

## 2019-08-31 ENCOUNTER — Encounter: Payer: Self-pay | Admitting: Podiatry

## 2019-08-31 ENCOUNTER — Other Ambulatory Visit: Payer: Self-pay

## 2019-08-31 ENCOUNTER — Ambulatory Visit (INDEPENDENT_AMBULATORY_CARE_PROVIDER_SITE_OTHER): Payer: Medicare Other | Admitting: Podiatry

## 2019-08-31 DIAGNOSIS — M79676 Pain in unspecified toe(s): Secondary | ICD-10-CM | POA: Diagnosis not present

## 2019-08-31 DIAGNOSIS — E119 Type 2 diabetes mellitus without complications: Secondary | ICD-10-CM | POA: Diagnosis not present

## 2019-08-31 DIAGNOSIS — D689 Coagulation defect, unspecified: Secondary | ICD-10-CM | POA: Diagnosis not present

## 2019-08-31 DIAGNOSIS — B351 Tinea unguium: Secondary | ICD-10-CM | POA: Diagnosis not present

## 2019-08-31 NOTE — Progress Notes (Signed)
Patient ID: Patrick Miranda, male   DOB: 1946/04/01, 74 y.o.   MRN: [id number] Complaint:  Visit Type: Patient returns to my office for continued preventative foot care services. Complaint: Patient states" my nails have grown long and thick and become painful to walk and wear shoes" Patient has been diagnosed with DM with no foot complications. The patient presents for preventative foot care services. No changes to ROS.  Patient is taking plavix.  Podiatric Exam: Vascular: dorsalis pedis and posterior tibial pulses are palpable bilateral. Capillary return is immediate. Temperature gradient is WNL. Skin turgor WNL  Sensorium: Normal Semmes Weinstein monofilament test. Normal tactile sensation bilaterally. Nail Exam: Pt has thick disfigured discolored nails with subungual debris noted bilateral entire nail hallux through fifth toenails except left hallux. Ulcer Exam: There is no evidence of ulcer or pre-ulcerative changes or infection. Orthopedic Exam: Muscle tone and strength are WNL. No limitations in general ROM. No crepitus or effusions noted. Foot type and digits show no abnormalities. HAV  B/L. Overlapping second toe left foot. Skin: No Porokeratosis. No infection or ulcers.  Asymptomatic pinch callus  B/L Diagnosis:  Onychomycosis, , Pain in right toe, pain in left toes  Treatment & Plan Procedures and Treatment: Consent by patient was obtained for treatment procedures. The patient understood the discussion of treatment and procedures well. All questions were answered thoroughly reviewed. Debridement of mycotic and hypertrophic toenails x 9.   No ulceration, no infection noted.  Return Visit-Office Procedure: Patient instructed to return to the office for a follow up visit 3 months for continued evaluation and treatment.    Gardiner Barefoot DPM

## 2019-10-05 DIAGNOSIS — Z794 Long term (current) use of insulin: Secondary | ICD-10-CM | POA: Diagnosis not present

## 2019-10-05 DIAGNOSIS — N1831 Chronic kidney disease, stage 3a: Secondary | ICD-10-CM | POA: Diagnosis not present

## 2019-10-05 DIAGNOSIS — I129 Hypertensive chronic kidney disease with stage 1 through stage 4 chronic kidney disease, or unspecified chronic kidney disease: Secondary | ICD-10-CM | POA: Diagnosis not present

## 2019-10-05 DIAGNOSIS — E1151 Type 2 diabetes mellitus with diabetic peripheral angiopathy without gangrene: Secondary | ICD-10-CM | POA: Diagnosis not present

## 2019-11-30 ENCOUNTER — Ambulatory Visit: Payer: Medicare Other | Admitting: Podiatry

## 2019-12-02 ENCOUNTER — Emergency Department (HOSPITAL_COMMUNITY): Payer: Medicare Other

## 2019-12-02 ENCOUNTER — Inpatient Hospital Stay (HOSPITAL_COMMUNITY)
Admission: EM | Admit: 2019-12-02 | Discharge: 2019-12-11 | DRG: 177 | Disposition: A | Discharge status: Home Health | Payer: Medicare Other | Attending: Internal Medicine | Admitting: Internal Medicine

## 2019-12-02 ENCOUNTER — Other Ambulatory Visit: Payer: Self-pay

## 2019-12-02 DIAGNOSIS — E875 Hyperkalemia: Secondary | ICD-10-CM | POA: Diagnosis present

## 2019-12-02 DIAGNOSIS — J80 Acute respiratory distress syndrome: Secondary | ICD-10-CM

## 2019-12-02 DIAGNOSIS — I480 Paroxysmal atrial fibrillation: Secondary | ICD-10-CM | POA: Diagnosis present

## 2019-12-02 DIAGNOSIS — F329 Major depressive disorder, single episode, unspecified: Secondary | ICD-10-CM | POA: Diagnosis present

## 2019-12-02 DIAGNOSIS — Z20828 Contact with and (suspected) exposure to other viral communicable diseases: Secondary | ICD-10-CM | POA: Diagnosis not present

## 2019-12-02 DIAGNOSIS — Z79899 Other long term (current) drug therapy: Secondary | ICD-10-CM | POA: Diagnosis not present

## 2019-12-02 DIAGNOSIS — N1831 Chronic kidney disease, stage 3a: Secondary | ICD-10-CM | POA: Diagnosis not present

## 2019-12-02 DIAGNOSIS — Z8673 Personal history of transient ischemic attack (TIA), and cerebral infarction without residual deficits: Secondary | ICD-10-CM

## 2019-12-02 DIAGNOSIS — Z888 Allergy status to other drugs, medicaments and biological substances status: Secondary | ICD-10-CM

## 2019-12-02 DIAGNOSIS — I248 Other forms of acute ischemic heart disease: Secondary | ICD-10-CM | POA: Diagnosis present

## 2019-12-02 DIAGNOSIS — Z7902 Long term (current) use of antithrombotics/antiplatelets: Secondary | ICD-10-CM

## 2019-12-02 DIAGNOSIS — Z96653 Presence of artificial knee joint, bilateral: Secondary | ICD-10-CM | POA: Diagnosis present

## 2019-12-02 DIAGNOSIS — J1282 Pneumonia due to coronavirus disease 2019: Secondary | ICD-10-CM | POA: Diagnosis present

## 2019-12-02 DIAGNOSIS — U071 COVID-19: Secondary | ICD-10-CM | POA: Diagnosis not present

## 2019-12-02 DIAGNOSIS — J9601 Acute respiratory failure with hypoxia: Secondary | ICD-10-CM | POA: Diagnosis present

## 2019-12-02 DIAGNOSIS — R197 Diarrhea, unspecified: Secondary | ICD-10-CM | POA: Diagnosis present

## 2019-12-02 DIAGNOSIS — I10 Essential (primary) hypertension: Secondary | ICD-10-CM | POA: Diagnosis present

## 2019-12-02 DIAGNOSIS — E785 Hyperlipidemia, unspecified: Secondary | ICD-10-CM | POA: Diagnosis present

## 2019-12-02 DIAGNOSIS — I214 Non-ST elevation (NSTEMI) myocardial infarction: Secondary | ICD-10-CM | POA: Diagnosis not present

## 2019-12-02 DIAGNOSIS — G4733 Obstructive sleep apnea (adult) (pediatric): Secondary | ICD-10-CM | POA: Diagnosis present

## 2019-12-02 DIAGNOSIS — I4891 Unspecified atrial fibrillation: Secondary | ICD-10-CM

## 2019-12-02 DIAGNOSIS — J9811 Atelectasis: Secondary | ICD-10-CM | POA: Diagnosis not present

## 2019-12-02 DIAGNOSIS — R21 Rash and other nonspecific skin eruption: Secondary | ICD-10-CM | POA: Diagnosis present

## 2019-12-02 DIAGNOSIS — E1110 Type 2 diabetes mellitus with ketoacidosis without coma: Secondary | ICD-10-CM | POA: Diagnosis present

## 2019-12-02 DIAGNOSIS — R079 Chest pain, unspecified: Secondary | ICD-10-CM | POA: Diagnosis not present

## 2019-12-02 DIAGNOSIS — I2694 Multiple subsegmental pulmonary emboli without acute cor pulmonale: Secondary | ICD-10-CM | POA: Diagnosis present

## 2019-12-02 DIAGNOSIS — Z794 Long term (current) use of insulin: Secondary | ICD-10-CM

## 2019-12-02 DIAGNOSIS — I2699 Other pulmonary embolism without acute cor pulmonale: Secondary | ICD-10-CM | POA: Diagnosis not present

## 2019-12-02 DIAGNOSIS — R590 Localized enlarged lymph nodes: Secondary | ICD-10-CM | POA: Diagnosis not present

## 2019-12-02 DIAGNOSIS — I129 Hypertensive chronic kidney disease with stage 1 through stage 4 chronic kidney disease, or unspecified chronic kidney disease: Secondary | ICD-10-CM | POA: Diagnosis not present

## 2019-12-02 DIAGNOSIS — R0602 Shortness of breath: Secondary | ICD-10-CM | POA: Diagnosis not present

## 2019-12-02 DIAGNOSIS — H9190 Unspecified hearing loss, unspecified ear: Secondary | ICD-10-CM | POA: Diagnosis present

## 2019-12-02 DIAGNOSIS — J189 Pneumonia, unspecified organism: Secondary | ICD-10-CM | POA: Diagnosis not present

## 2019-12-02 DIAGNOSIS — D72829 Elevated white blood cell count, unspecified: Secondary | ICD-10-CM | POA: Diagnosis not present

## 2019-12-02 HISTORY — DX: COVID-19: U07.1

## 2019-12-02 LAB — POCT I-STAT 7, (LYTES, BLD GAS, ICA,H+H)
Acid-base deficit: 10 mmol/L — ABNORMAL HIGH (ref 0.0–2.0)
Bicarbonate: 13.8 mmol/L — ABNORMAL LOW (ref 20.0–28.0)
Calcium, Ion: 1.24 mmol/L (ref 1.15–1.40)
HCT: 37 % — ABNORMAL LOW (ref 39.0–52.0)
Hemoglobin: 12.6 g/dL — ABNORMAL LOW (ref 13.0–17.0)
O2 Saturation: 90 %
Patient temperature: 98.4
Potassium: 4.8 mmol/L (ref 3.5–5.1)
Sodium: 139 mmol/L (ref 135–145)
TCO2: 15 mmol/L — ABNORMAL LOW (ref 22–32)
pCO2 arterial: 25.4 mmHg — ABNORMAL LOW (ref 32.0–48.0)
pH, Arterial: 7.343 — ABNORMAL LOW (ref 7.350–7.450)
pO2, Arterial: 60 mmHg — ABNORMAL LOW (ref 83.0–108.0)

## 2019-12-02 LAB — CBC WITH DIFFERENTIAL/PLATELET
Abs Immature Granulocytes: 0.12 10*3/uL — ABNORMAL HIGH (ref 0.00–0.07)
Basophils Absolute: 0 10*3/uL (ref 0.0–0.1)
Basophils Relative: 0 %
Eosinophils Absolute: 0 10*3/uL (ref 0.0–0.5)
Eosinophils Relative: 0 %
HCT: 44 % (ref 39.0–52.0)
Hemoglobin: 13 g/dL (ref 13.0–17.0)
Immature Granulocytes: 1 %
Lymphocytes Relative: 8 %
Lymphs Abs: 1.2 10*3/uL (ref 0.7–4.0)
MCH: 28.2 pg (ref 26.0–34.0)
MCHC: 29.5 g/dL — ABNORMAL LOW (ref 30.0–36.0)
MCV: 95.4 fL (ref 80.0–100.0)
Monocytes Absolute: 0.9 10*3/uL (ref 0.1–1.0)
Monocytes Relative: 6 %
Neutro Abs: 12.2 10*3/uL — ABNORMAL HIGH (ref 1.7–7.7)
Neutrophils Relative %: 85 %
Platelets: 438 10*3/uL — ABNORMAL HIGH (ref 150–400)
RBC: 4.61 MIL/uL (ref 4.22–5.81)
RDW: 13.6 % (ref 11.5–15.5)
WBC: 14.3 10*3/uL — ABNORMAL HIGH (ref 4.0–10.5)
nRBC: 0 % (ref 0.0–0.2)

## 2019-12-02 LAB — I-STAT CHEM 8, ED
BUN: 28 mg/dL — ABNORMAL HIGH (ref 8–23)
Calcium, Ion: 1.15 mmol/L (ref 1.15–1.40)
Chloride: 111 mmol/L (ref 98–111)
Creatinine, Ser: 1.3 mg/dL — ABNORMAL HIGH (ref 0.61–1.24)
Glucose, Bld: 547 mg/dL (ref 70–99)
HCT: 42 % (ref 39.0–52.0)
Hemoglobin: 14.3 g/dL (ref 13.0–17.0)
Potassium: 4.9 mmol/L (ref 3.5–5.1)
Sodium: 140 mmol/L (ref 135–145)
TCO2: 15 mmol/L — ABNORMAL LOW (ref 22–32)

## 2019-12-02 LAB — CBG MONITORING, ED
Glucose-Capillary: 511 mg/dL (ref 70–99)
Glucose-Capillary: 391 mg/dL — ABNORMAL HIGH (ref 70–99)
Glucose-Capillary: 246 mg/dL — ABNORMAL HIGH (ref 70–99)

## 2019-12-02 LAB — FIBRINOGEN: Fibrinogen: 410 mg/dL (ref 210–475)

## 2019-12-02 LAB — COMPREHENSIVE METABOLIC PANEL
ALT: 13 U/L (ref 0–44)
AST: 39 U/L (ref 15–41)
Albumin: 2.2 g/dL — ABNORMAL LOW (ref 3.5–5.0)
Alkaline Phosphatase: 63 U/L (ref 38–126)
Anion gap: 19 — ABNORMAL HIGH (ref 5–15)
BUN: 27 mg/dL — ABNORMAL HIGH (ref 8–23)
CO2: 14 mmol/L — ABNORMAL LOW (ref 22–32)
Calcium: 8.9 mg/dL (ref 8.9–10.3)
Chloride: 108 mmol/L (ref 98–111)
Creatinine, Ser: 1.72 mg/dL — ABNORMAL HIGH (ref 0.61–1.24)
GFR calc Af Amer: 44 mL/min — ABNORMAL LOW (ref >60)
GFR calc non Af Amer: 38 mL/min — ABNORMAL LOW (ref >60)
Glucose, Bld: 556 mg/dL (ref 70–99)
Potassium: 5.4 mmol/L — ABNORMAL HIGH (ref 3.5–5.1)
Sodium: 141 mmol/L (ref 135–145)
Total Bilirubin: 1.6 mg/dL — ABNORMAL HIGH (ref 0.3–1.2)
Total Protein: 6.7 g/dL (ref 6.5–8.1)

## 2019-12-02 LAB — FERRITIN: Ferritin: 479 ng/mL — ABNORMAL HIGH (ref 24–336)

## 2019-12-02 LAB — URINALYSIS, ROUTINE W REFLEX MICROSCOPIC
Bilirubin Urine: NEGATIVE
Glucose, UA: >=500 mg/dL — AB
Ketones, ur: 20 mg/dL — AB
Leukocytes,Ua: NEGATIVE
Nitrite: NEGATIVE
Protein, ur: 30 mg/dL — AB
Specific Gravity, Urine: 1.032 — ABNORMAL HIGH (ref 1.005–1.030)
pH: 5 (ref 5.0–8.0)

## 2019-12-02 LAB — SARS CORONAVIRUS 2 BY RT PCR (HOSPITAL ORDER, PERFORMED IN ~~LOC~~ HOSPITAL LAB): SARS Coronavirus 2: POSITIVE — AB

## 2019-12-02 LAB — MAGNESIUM
Magnesium: 2.1 mg/dL (ref 1.7–2.4)
Magnesium: 2.4 mg/dL (ref 1.7–2.4)

## 2019-12-02 LAB — POC SARS CORONAVIRUS 2 AG -  ED: SARS Coronavirus 2 Ag: NEGATIVE

## 2019-12-02 LAB — C-REACTIVE PROTEIN: CRP: 18.8 mg/dL — ABNORMAL HIGH (ref <1.0)

## 2019-12-02 LAB — TROPONIN I (HIGH SENSITIVITY)
Troponin I (High Sensitivity): 333 ng/L (ref <18)
Troponin I (High Sensitivity): 4543 ng/L (ref <18)

## 2019-12-02 LAB — BRAIN NATRIURETIC PEPTIDE: B Natriuretic Peptide: 439.8 pg/mL — ABNORMAL HIGH (ref 0.0–100.0)

## 2019-12-02 LAB — LACTIC ACID, PLASMA
Lactic Acid, Venous: 4.5 mmol/L (ref 0.5–1.9)
Lactic Acid, Venous: 3.2 mmol/L (ref 0.5–1.9)

## 2019-12-02 LAB — LACTATE DEHYDROGENASE: LDH: 317 U/L — ABNORMAL HIGH (ref 98–192)

## 2019-12-02 LAB — TRIGLYCERIDES: Triglycerides: 211 mg/dL — ABNORMAL HIGH (ref <150)

## 2019-12-02 LAB — D-DIMER, QUANTITATIVE: D-Dimer, Quant: >20 ug/mL-FEU — ABNORMAL HIGH (ref 0.00–0.50)

## 2019-12-02 LAB — PROCALCITONIN: Procalcitonin: 0.13 ng/mL

## 2019-12-02 MED ORDER — METOPROLOL TARTRATE 5 MG/5ML IV SOLN: 2.5000 mg | Freq: Once | INTRAVENOUS | Status: DC

## 2019-12-02 MED ORDER — SODIUM CHLORIDE 0.9 % IV SOLN
1.0000 g | INTRAVENOUS | Status: DC
  Administered 2019-12-03: 1 g via INTRAVENOUS
  Filled 2019-12-02: qty 10

## 2019-12-02 MED ORDER — IOHEXOL 350 MG/ML SOLN
100.0000 mL | Freq: Once | INTRAVENOUS | Status: AC | PRN
  Administered 2019-12-02: 100 mL via INTRAVENOUS

## 2019-12-02 MED ORDER — HEPARIN BOLUS VIA INFUSION
5000.0000 [IU] | Freq: Once | INTRAVENOUS | Status: AC
  Administered 2019-12-02: 5000 [IU] via INTRAVENOUS
  Filled 2019-12-02: qty 5000

## 2019-12-02 MED ORDER — CLOPIDOGREL BISULFATE 75 MG PO TABS
75.0000 mg | ORAL_TABLET | Freq: Every day | ORAL | Status: DC
  Administered 2019-12-03 – 2019-12-11 (×9): 75 mg via ORAL
  Filled 2019-12-02 (×9): qty 1

## 2019-12-02 MED ORDER — SODIUM CHLORIDE 0.9 % IV SOLN: INTRAVENOUS | Status: DC

## 2019-12-02 MED ORDER — DEXTROSE-NACL 5-0.45 % IV SOLN: INTRAVENOUS | Status: DC

## 2019-12-02 MED ORDER — SODIUM CHLORIDE 0.9% FLUSH: 10.0000 mL | INTRAVENOUS | Status: DC | PRN

## 2019-12-02 MED ORDER — POLYETHYLENE GLYCOL 3350 17 G PO PACK: 17.0000 g | PACK | Freq: Every day | ORAL | Status: DC | PRN

## 2019-12-02 MED ORDER — METOPROLOL TARTRATE 5 MG/5ML IV SOLN
2.5000 mg | Freq: Once | INTRAVENOUS | Status: AC
  Administered 2019-12-02: 2.5 mg via INTRAVENOUS
  Filled 2019-12-02: qty 5

## 2019-12-02 MED ORDER — HEPARIN (PORCINE) 25000 UT/250ML-% IV SOLN
1350.0000 [IU]/h | INTRAVENOUS | Status: DC
  Administered 2019-12-02: 1350 [IU]/h via INTRAVENOUS
  Administered 2019-12-04: 1300 [IU]/h via INTRAVENOUS
  Filled 2019-12-02 (×4): qty 250

## 2019-12-02 MED ORDER — INSULIN REGULAR(HUMAN) IN NACL 100-0.9 UT/100ML-% IV SOLN
INTRAVENOUS | Status: DC
  Administered 2019-12-02: 12 [IU]/h via INTRAVENOUS
  Filled 2019-12-02: qty 100

## 2019-12-02 MED ORDER — DEXTROSE 50 % IV SOLN: 0.0000 mL | INTRAVENOUS | Status: DC | PRN

## 2019-12-02 MED ORDER — SODIUM CHLORIDE 0.9 % IV SOLN
200.0000 mg | Freq: Once | INTRAVENOUS | Status: AC
  Administered 2019-12-03: 200 mg via INTRAVENOUS
  Filled 2019-12-02: qty 40

## 2019-12-02 MED ORDER — DOCUSATE SODIUM 100 MG PO CAPS: 100.0000 mg | ORAL_CAPSULE | Freq: Two times a day (BID) | ORAL | Status: DC | PRN

## 2019-12-02 MED ORDER — METOPROLOL TARTRATE 5 MG/5ML IV SOLN
2.5000 mg | Freq: Once | INTRAVENOUS | Status: AC
  Administered 2019-12-02: 2.5 mg via INTRAVENOUS

## 2019-12-02 MED ORDER — SODIUM CHLORIDE 0.9 % IV SOLN
500.0000 mg | INTRAVENOUS | Status: DC
  Administered 2019-12-03: 500 mg via INTRAVENOUS
  Filled 2019-12-02 (×2): qty 500

## 2019-12-02 MED ORDER — DEXAMETHASONE SODIUM PHOSPHATE 10 MG/ML IJ SOLN
6.0000 mg | INTRAMUSCULAR | Status: AC
  Administered 2019-12-02 – 2019-12-11 (×10): 6 mg via INTRAVENOUS
  Filled 2019-12-02 (×10): qty 1

## 2019-12-02 MED ORDER — SODIUM CHLORIDE 0.9% FLUSH
10.0000 mL | Freq: Two times a day (BID) | INTRAVENOUS | Status: DC
  Administered 2019-12-03 – 2019-12-11 (×13): 10 mL

## 2019-12-02 MED ORDER — ASPIRIN 325 MG PO TABS
325.0000 mg | ORAL_TABLET | Freq: Once | ORAL | Status: AC
  Administered 2019-12-03: 325 mg via ORAL
  Filled 2019-12-02: qty 1

## 2019-12-02 MED ORDER — SODIUM CHLORIDE 0.9 % IV SOLN
100.0000 mg | Freq: Every day | INTRAVENOUS | Status: AC
  Administered 2019-12-03 – 2019-12-06 (×4): 100 mg via INTRAVENOUS
  Filled 2019-12-02 (×4): qty 20

## 2019-12-02 NOTE — ED Notes (Signed)
IV Team at Bedside. 

## 2019-12-02 NOTE — ED Notes (Signed)
This RN returned this pt back from CT Scan; Family updated.

## 2019-12-02 NOTE — ED Triage Notes (Signed)
Pt arrives to ED w/ c/o SOB. Pt had x-ray today that showed possible PNA. Pt SPO2 84% on RA in triage, pt placed on 3  lpm O2 via . Pt afebrile, denies cough.

## 2019-12-02 NOTE — Progress Notes (Signed)
ANTICOAGULATION CONSULT NOTE - Initial Consult  Pharmacy Consult for heparin Indication: pulmonary embolus  Allergies  Allergen Reactions  . Lipitor [Atorvastatin] Other (See Comments)    Muscle pain    Patient Measurements: Weight: 79.4 kg (175 lb)  Vital Signs: Temp: 97.6 F (36.4 C) (05/14 1537) BP: 159/85 (05/14 1909) Pulse Rate: 79 (05/14 1930)  Labs: Recent Labs    12/02/19 1548 12/02/19 1548 12/02/19 1604 12/02/19 1622 12/02/19 1816  HGB 13.0   < > 14.3 12.6*  --   HCT 44.0  --  42.0 37.0*  --   PLT 438*  --   --   --   --   CREATININE 1.72*  --  1.30*  --   --   TROPONINIHS 333*  --   --   --  4,543*   < > = values in this interval not displayed.    CrCl cannot be calculated (Unknown ideal weight.).   Medical History: Past Medical History:  Diagnosis Date  . Arthritis   . 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 Cerebral artery disease   . Depression   . Diabetes mellitus   . Hernia, umbilical   . HOH (hard of hearing)   . Hyperlipidemia   . Hypertension   . Sleep apnea    on cpap at home    Assessment: 74 year old male presenting with SOB found to be COVID+ with a pulmonary embolism. Pharmacy consulted for heparin dosing. Patient not on any anticoagulation prior to admission. Hemoglobin slightly low at 12.6, baseline >13.0.   Goal of Therapy:  Heparin level 0.3-0.7 units/ml Monitor platelets by anticoagulation protocol: Yes   Plan:  Heparin bolus 5000 units x1 Heparin infusion at 1350 units/hour 8-hour heparin level with morning labs Daily heparin level and CBC Monitor for signs and symptoms of bleeding     James Lafalce L. Devin Going, [place] PGY1 Pharmacy Resident [phone] 12/02/19      8:53 PM  Please check AMION for all [place] phone numbers After 10:00 PM, call the [place] [phone]

## 2019-12-02 NOTE — ED Notes (Signed)
This RN notified EDP regarding this pts Elevated Troponin; this RN provided an EKG and the pt appears stable with no CP currently nor since arriving.

## 2019-12-02 NOTE — H&P (Addendum)
NAME:  Ori[place], MRN:  [id number], DOB:  1945/09/04, LOS: 0 ADMISSION DATE:  12/02/2019, CONSULTATION DATE:  12/02/19 REFERRING MD:  EDP, CHIEF COMPLAINT:  PNA   Brief History   74 year old male presented with several days of shortness of breath and developed chest pain on 5/14.  He was found to have Covid pneumonia, new onset atrial fibrillation with RVR, and bilateral pulmonary emboli.  PCCM consulted for admission.  History of present illness   Patrick Miranda is a 74 year old male with past medical history of CVA, type 2 diabetes, hyperlipidemia, hypertension, OSA compliant with CPAP who was exposed to Covid-19 around 5/1 then developed shortness of breath 3 days ago.  This progressively worsened and he developed central chest pain today so presented to the emergency department.  He has not received any Covid-19 vaccination.    In the ED, he was initially in atrial fibrillation with RVR with heart rate in the 140s and borderline low blood pressure.  Oxygen saturations 88%.  He was placed on nasal cannula oxygen and given IV metoprolol.  Chest x-ray showed patchy airspace opacities, CTA chest showed PE in the segmental right lower lobe PA and subsegmental left lower lobe PE without evidence of right heart strain.  Covid-19 results were positive along with elevated troponin 4,545, BNP 439, and lactic acid 4.5, WBC 14.3, glucose also significantly elevated >500 with bicarb of 15 and anion gap 19.  EKG without evidence of acute ischemia. Pt was started on a heparin gtt and insulin gtt and PCCM consulted for admission.  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  Significant Hospital Events   5/14 Admit to PCCM>>  Consults:    Procedures:    Significant Diagnostic Tests:  5/14  CXR>>Coarse interstitial and patchy airspace opacities in the bilateral lung bases 5/14 CTA chest>>Partially occlusive thrombus within the bifurcation of the segmental anterior posterior right lower lobe pulmonary arteries as well as within the subsegmental anterior left lower lobe pulmonary arterial.  Micro Data:  12/02/19 Sars-Cov-2>>positive  12/02/19 Blood cultures>> 12/02/19 UC>>  Antimicrobials:   Ceftriaxone 5/14- Azithromycin 5/14-  Interim history/subjective:  Pt awake and alert and stable on 10L [place]  Objective   Blood pressure (!) 143/89, pulse 70, temperature 97.6 F (36.4 C), resp. rate (!) 31, weight 79.4 kg, SpO2 97 %.       No intake or output data in the 24 hours ending 12/02/19 2250 Filed Weights   12/02/19 2000  Weight: 79.4 kg    General: Elderly, well-nourished male alert and conversational in no distress HEENT: MM pink/moist Neuro: Awake alert oriented x3, following commands and conversational CV: s1s2 regular rate and rhythm, no m/r/g PULM: Mild rhonchi bilateral bases, oxygen saturations greater than 92% on 10 L nasal cannula without tachypnea or retractions GI: soft, bsx4 active  Extremities: warm/dry, no edema  Skin: no rashes or lesions  Resolved Hospital Problem list   Afib with RVR  Assessment & Plan:    Acute hypoxic respiratory failure secondary to Covid-19 pneumonia and bilateral PE -Pt feeling much improved since arrival, saturating well on 10 L nasal cannula -No right heart strain on CTA P: -Continue heparin drip -Start remdesivir and dexamethasone, no indication for Actemra -Supplemental oxygen as needed to maintain sats> 92%, patient would like to be intubated if needed -Follow blood cultures and  cover for possible concurrent bacterial infection with azithromycin and ceftriaxone -Echocardiogram   Chest Pain with elevated troponin -secondary to PE vs NSTEMI -chest pain resolved without nitro or morphine P: -Continue heparin  drip, significant troponin jump from 300->4500 -Give aspirin 325 mg, continue home Plavix tomorrow -Start high intensity statin -Repeat EKG in the morning -Echo -consult cardiology   New onset atrial fibrillation with RVR -Converted to sinus rhythm in the ED -Likely secondary to acute issues as above P: -Check thyroid function and echo   Type 2 diabetes with hyperglycemia -Insulin drip initiated in the ED -pH 7.34 P: -Continue insulin drip, follow serial BMP and check beta hydroxybutyrate -A1c -Diabetic diet   Best practice:  Diet: NPO Pain/Anxiety/Delirium protocol (if indicated): N/A VAP protocol (if indicated): N/A DVT prophylaxis: Heparin drip GI prophylaxis: N/A Glucose control: Insulin drip Mobility: Bedrest Code Status: Full code Family Communication: We will try to reach wife Disposition: ICU  Labs   CBC: Recent Labs  Lab 12/02/19 1548 12/02/19 1604 12/02/19 1622  WBC 14.3*  --   --   NEUTROABS 12.2*  --   --   HGB 13.0 14.3 12.6*  HCT 44.0 42.0 37.0*  MCV 95.4  --   --   PLT 438*  --   --     Basic Metabolic Panel: Recent Labs  Lab 12/02/19 1548 12/02/19 1604 12/02/19 1622  NA 141 140 139  K 5.4* 4.9 4.8  CL 108 111  --   CO2 14*  --   --   GLUCOSE 556* 547*  --   BUN 27* 28*  --   CREATININE 1.72* 1.30*  --   CALCIUM 8.9  --   --   MG 2.1  --   --    GFR: CrCl cannot be calculated (Unknown ideal weight.). Recent Labs  Lab 12/02/19 1548 12/02/19 1558 12/02/19 1715 12/02/19 1816  PROCALCITON  --  0.13  --   --   WBC 14.3*  --   --   --   LATICACIDVEN  --   --  4.5* 3.2*    Liver Function Tests: Recent Labs  Lab 12/02/19 1548  AST 39  ALT 13  ALKPHOS 63  BILITOT 1.6*  PROT 6.7  ALBUMIN 2.2*   No results for input(s): LIPASE, AMYLASE in the last 168 hours. No results for input(s): AMMONIA in the last 168 hours.  ABG    Component Value Date/Time   PHART 7.343 (L) 12/02/2019 1622   PCO2ART 25.4 (L) 12/02/2019 1622     PO2ART 60 (L) 12/02/2019 1622   HCO3 13.8 (L) 12/02/2019 1622   TCO2 15 (L) 12/02/2019 1622   ACIDBASEDEF 10.0 (H) 12/02/2019 1622   O2SAT 90.0 12/02/2019 1622     Coagulation Profile: No results for input(s): INR, PROTIME in the last 168 hours.  Cardiac Enzymes: No results for input(s): CKTOTAL, CKMB, CKMBINDEX, TROPONINI in the last 168 hours.  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   Surgical History    Past Surgical History:  Procedure Laterality Date  . COLONOSCOPY W/ POLYPECTOMY    . TOTAL KNEE ARTHROPLASTY  Left 04/22/2013   Procedure: TOTAL KNEE ARTHROPLASTY- left;  Surgeon: Augustin Schooling, MD;  Location: [place] [facility];  Service: Orthopedics;  Laterality: Left;  with femoral block  . TOTAL KNEE ARTHROPLASTY Right 01/06/2014   Procedure: RIGHT TOTAL KNEE ARTHROPLASTY;  Surgeon: Augustin Schooling, MD;  Location: [facility];  Service: Orthopedics;  Laterality: Right;     Social History   reports that he has never smoked. He has never used smokeless tobacco. He reports that he does not drink alcohol or use drugs.   Family History   His family history is not on file.   Allergies Allergies  Allergen Reactions  . Lipitor [Atorvastatin] Other (See Comments)    Muscle pain     Home Medications  Prior to Admission medications   Medication Sig Start Date End Date Taking? Authorizing Provider  BD PEN NEEDLE NANO U/F 32G X 4 MM MISC 1 each by Other route daily.  03/19/18  Yes [provider]  carvedilol (COREG) 12.5 MG tablet Take 12.5 mg by mouth 2 (two) times daily with a meal.   Yes [provider]  clopidogrel (PLAVIX) 75 MG tablet Take 75 mg by mouth daily with breakfast.   Yes [provider]  Empagliflozin-Metformin HCl (SYNJARDY) 11-998 MG TABS Take 1 tablet by mouth 2 (two) times daily.   Yes [provider]  fenofibrate 160 MG tablet Take 160 mg by mouth daily.   Yes [provider]  HUMALOG KWIKPEN 100 UNIT/ML KwikPen Inject 10 Units into the skin with breakfast, with lunch, and with evening meal.  04/27/18  Yes [provider]  losartan-hydrochlorothiazide (HYZAAR) 50-12.5 MG per tablet Take 1 tablet by mouth daily.   Yes [provider]  methylphenidate (RITALIN) 5 MG tablet Take 1 tablet (5 mg total) by mouth 2 (two) times daily. If needed for sleepiness Patient taking differently: Take 5 mg by mouth once a week. On sundays 07/13/19  Yes Young, Kasandra Knudsen, MD  [facility] VERIO test strip 1 each by Other route in the morning, at noon, and at bedtime.  03/20/18  Yes [provider]  TOUJEO SOLOSTAR 300 UNIT/ML SOPN Inject 30 Units into the skin daily.  06/25/18  Yes [provider]  ipratropium (ATROVENT) 0.03 % nasal spray 1-2 sprays each nostril every 8-12 hours if needed Patient not taking: Reported on 12/02/2019 07/10/17   Deneise Lever, MD     Critical care time: 65 minutes     CRITICAL CARE Performed by: Otilio Carpen Murl Zogg   Total critical care time: 65 minutes  Critical care time was exclusive of separately billable procedures and treating other patients.  Critical care was necessary to treat or prevent imminent or life-threatening deterioration.  Critical care was time spent personally by me on the following activities: development of treatment plan with patient and/or surrogate as well as nursing, discussions with consultants, evaluation of patient's response to treatment, examination of patient, obtaining history from patient or surrogate, ordering and performing treatments and interventions,  ordering and review of laboratory studies, ordering and review of radiographic studies, pulse oximetry and re-evaluation of patient's condition.   Otilio Carpen Yeni Jiggetts, PA-C

## 2019-12-02 NOTE — ED Notes (Signed)
EDP made aware of elevated Troponin; no further orders at this time.

## 2019-12-02 NOTE — ED Notes (Signed)
EDP made aware of elevated Lactic Acid; no further orders at this time.

## 2019-12-02 NOTE — ED Notes (Signed)
This RN called CT for this pts CTA; this RN will be notified when ready.

## 2019-12-02 NOTE — ED Notes (Signed)
This RN attempted to give report for this pt; the RN will return my call shortly.

## 2019-12-02 NOTE — ED Provider Notes (Signed)
[facility] EMERGENCY DEPARTMENT Provider Note   CSN: [id number] Arrival date & time: 12/02/19  1528     History Chief Complaint  Patient presents with  . Shortness of Breath    Patrick Miranda is a 74 y.o. male.  The history is provided by the patient, medical records and the spouse. No language interpreter was used.   Patrick Miranda is a 74 y.o. male who presents to the Emergency Department complaining of shortness of breath. Level V caveat due to respiratory distress. He presents the emergency department upon referral by his PCP for evaluation of shortness of breath. He complains of shortness of breath for the last few days with central chest pain that began this morning. No reports of fever. He does have some mild cough. Wife reports that he has some diarrhea and decreased appetite for the last few days. He had an outpatient chest x-ray performed PCPs office that demonstrate multifocal pneumonia and he was referred to the emergency department for further evaluation. He states that he is not been compliant with his medications for the last few days because he has not felt well. He has been exposed to COVID-19 around May 1. He states he lives with his daughter and she has tested negative. He did have a rapid COVID antigen test performed in his PCPs office today, which was negative.    Past Medical History:  Diagnosis Date  . Arthritis   . 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 Cerebral artery disease   . Depression   . Diabetes mellitus   . Hernia, umbilical   . HOH (hard of hearing)   . Hyperlipidemia   . Hypertension   . Sleep apnea    on cpap at home    Patient Active Problem List   Diagnosis Date Noted  . Pneumonia due to COVID-19 virus 12/02/2019  . Hypersomnia 08/30/2019  . History of adenomatous polyp of colon 05/06/2018  . Vasomotor rhinitis 07/18/2017  . Arthritis of knee, right 01/06/2014  . Brainstem stroke ([place])  09/07/2011    Class: Acute  . Dysarthria 09/05/2011    Class: Acute  . Chronic kidney disease (CKD), stage II (mild) 09/05/2011    Class: Chronic  . Hypertension 09/05/2011    Class: Chronic  . DM (diabetes mellitus), type 2 ([place]) 09/05/2011    Class: Chronic  . Hyperlipidemia 09/05/2011    Class: Chronic  . Obstructive sleep apnea 09/05/2011    Class: Chronic  . Anemia 09/05/2011    Class: Chronic    Past Surgical History:  Procedure Laterality Date  . COLONOSCOPY W/ POLYPECTOMY    . TOTAL KNEE ARTHROPLASTY Left 04/22/2013   Procedure: TOTAL KNEE ARTHROPLASTY- left;  Surgeon: Augustin Schooling, MD;  Location: [facility];  Service: Orthopedics;  Laterality: Left;  with femoral block  . TOTAL KNEE ARTHROPLASTY Right 01/06/2014   Procedure: RIGHT TOTAL KNEE ARTHROPLASTY;  Surgeon: Augustin Schooling, MD;  Location: [place];  Service: Orthopedics;  Laterality: Right;       No family history on file.  Social History   Tobacco Use  . Smoking status: Never Smoker  . Smokeless tobacco: Never Used  Substance Use Topics  . Alcohol use: No  . Drug use: No    Home Medications Prior to Admission medications   Medication Sig Start Date End Date Taking? Authorizing Provider  BD PEN NEEDLE NANO U/F 32G X 4 MM MISC 1 each by Other  route daily.  03/19/18  Yes [provider]  carvedilol (COREG) 12.5 MG tablet Take 12.5 mg by mouth 2 (two) times daily with a meal.   Yes [provider]  clopidogrel (PLAVIX) 75 MG tablet Take 75 mg by mouth daily with breakfast.   Yes [provider]  Empagliflozin-Metformin HCl (SYNJARDY) 11-998 MG TABS Take 1 tablet by mouth 2 (two) times daily.   Yes [provider]  fenofibrate 160 MG tablet Take 160 mg by mouth daily.   Yes [provider]  HUMALOG KWIKPEN 100 UNIT/ML KwikPen Inject 10 Units into the skin with breakfast, with lunch, and with evening meal.  04/27/18  Yes [provider]    losartan-hydrochlorothiazide (HYZAAR) 50-12.5 MG per tablet Take 1 tablet by mouth daily.   Yes [provider]  methylphenidate (RITALIN) 5 MG tablet Take 1 tablet (5 mg total) by mouth 2 (two) times daily. If needed for sleepiness Patient taking differently: Take 5 mg by mouth once a week. On sundays 07/13/19  Yes Young, Kasandra Knudsen, MD  [facility] VERIO test strip 1 each by Other route in the morning, at noon, and at bedtime.  03/20/18  Yes [provider]  TOUJEO SOLOSTAR 300 UNIT/ML SOPN Inject 30 Units into the skin daily.  06/25/18  Yes [provider]  ipratropium (ATROVENT) 0.03 % nasal spray 1-2 sprays each nostril every 8-12 hours if needed Patient not taking: Reported on 12/02/2019 07/10/17   Deneise Lever, MD    Allergies    Lipitor [atorvastatin]  Review of Systems   Review of Systems  All other systems reviewed and are negative.   Physical Exam Updated Vital Signs BP (!) 143/89   Pulse 70   Temp 97.6 F (36.4 C)   Resp (!) 31   Wt 79.4 kg   SpO2 97%   BMI 24.75 kg/m   Physical Exam Vitals and nursing note reviewed.  Constitutional:      General: He is in acute distress.     Appearance: He is well-developed. He is ill-appearing.  HENT:     Head: Normocephalic and atraumatic.  Cardiovascular:     Rate and Rhythm: Tachycardia present. Rhythm irregular.     Heart sounds: No murmur.  Pulmonary:     Effort: Respiratory distress present.     Comments: Tachypnea with increased work of breathing. Good air movement bilaterally Abdominal:     Palpations: Abdomen is soft.     Tenderness: There is no abdominal tenderness. There is no guarding or rebound.  Musculoskeletal:        General: No swelling or tenderness.  Skin:    General: Skin is warm and dry.     Coloration: Skin is pale.  Neurological:     Mental Status: He is alert and oriented to person, place, and time.  Psychiatric:        Behavior: Behavior normal.     ED Results /  Procedures / Treatments   Labs (all labs ordered are listed, but only abnormal results are displayed) Labs Reviewed  SARS CORONAVIRUS 2 BY RT PCR (HOSPITAL ORDER, [place] [facility]) - Abnormal; Notable for the following components:      Result Value   SARS Coronavirus 2 POSITIVE (*)    All other components within normal limits  COMPREHENSIVE METABOLIC PANEL - Abnormal; Notable for the following components:   Potassium 5.4 (*)    CO2 14 (*)    Glucose, Bld 556 (*)  BUN 27 (*)    Creatinine, Ser 1.72 (*)    Albumin 2.2 (*)    Total Bilirubin 1.6 (*)    GFR calc non Af Amer 38 (*)    GFR calc Af Amer 44 (*)    Anion gap 19 (*)    All other components within normal limits  LACTIC ACID, PLASMA - Abnormal; Notable for the following components:   Lactic Acid, Venous 3.2 (*)    All other components within normal limits  LACTIC ACID, PLASMA - Abnormal; Notable for the following components:   Lactic Acid, Venous 4.5 (*)    All other components within normal limits  CBC WITH DIFFERENTIAL/PLATELET - Abnormal; Notable for the following components:   WBC 14.3 (*)    MCHC 29.5 (*)    Platelets 438 (*)    Neutro Abs 12.2 (*)    Abs Immature Granulocytes 0.12 (*)    All other components within normal limits  URINALYSIS, ROUTINE W REFLEX MICROSCOPIC - Abnormal; Notable for the following components:   Specific Gravity, Urine 1.032 (*)    Glucose, UA >=500 (*)    Hgb urine dipstick LARGE (*)    Ketones, ur 20 (*)    Protein, ur 30 (*)    Bacteria, UA RARE (*)    All other components within normal limits  LACTATE DEHYDROGENASE - Abnormal; Notable for the following components:   LDH 317 (*)    All other components within normal limits  D-DIMER, QUANTITATIVE (NOT AT [facility]) - Abnormal; Notable for the following components:   D-Dimer, Quant >20.00 (*)    All other components within normal limits  FERRITIN - Abnormal; Notable for the following components:   Ferritin 479 (*)     All other components within normal limits  C-REACTIVE PROTEIN - Abnormal; Notable for the following components:   CRP 18.8 (*)    All other components within normal limits  BRAIN NATRIURETIC PEPTIDE - Abnormal; Notable for the following components:   B Natriuretic Peptide 439.8 (*)    All other components within normal limits  TRIGLYCERIDES - Abnormal; Notable for the following components:   Triglycerides 211 (*)    All other components within normal limits  CBG MONITORING, ED - Abnormal; Notable for the following components:   Glucose-Capillary 511 (*)    All other components within normal limits  I-STAT CHEM 8, ED - Abnormal; Notable for the following components:   BUN 28 (*)    Creatinine, Ser 1.30 (*)    Glucose, Bld 547 (*)    TCO2 15 (*)    All other components within normal limits  POCT I-STAT 7, (LYTES, BLD GAS, ICA,H+H) - Abnormal; Notable for the following components:   pH, Arterial 7.343 (*)    pCO2 arterial 25.4 (*)    pO2, Arterial 60 (*)    Bicarbonate 13.8 (*)    TCO2 15 (*)    Acid-base deficit 10.0 (*)    HCT 37.0 (*)    Hemoglobin 12.6 (*)    All other components within normal limits  CBG MONITORING, ED - Abnormal; Notable for the following components:   Glucose-Capillary 391 (*)    All other components within normal limits  CBG MONITORING, ED - Abnormal; Notable for the following components:   Glucose-Capillary 246 (*)    All other components within normal limits  TROPONIN I (HIGH SENSITIVITY) - Abnormal; Notable for the following components:   Troponin I (High Sensitivity) 333 (*)    All other  components within normal limits  TROPONIN I (HIGH SENSITIVITY) - Abnormal; Notable for the following components:   Troponin I (High Sensitivity) 4,543 (*)    All other components within normal limits  URINE CULTURE  CULTURE, BLOOD (ROUTINE X 2)  CULTURE, BLOOD (ROUTINE X 2)  MAGNESIUM  FIBRINOGEN  PROCALCITONIN  HEPARIN LEVEL (UNFRACTIONATED)  CBC  BASIC  METABOLIC PANEL  MAGNESIUM  PHOSPHORUS  TSH  T4, FREE  MAGNESIUM  BETA-HYDROXYBUTYRIC ACID  I-STAT ARTERIAL BLOOD GAS, ED  POC SARS CORONAVIRUS 2 AG -  ED    EKG EKG Interpretation  Date/Time:  Friday Dec 02 2019 16:10:52 EDT Ventricular Rate:  87 PR Interval:    QRS Duration: 119 QT Interval:  413 QTC Calculation: 497 R Axis:   83 Text Interpretation: Sinus rhythm Borderline short PR interval Nonspecific intraventricular conduction delay Low voltage, precordial leads Borderline repolarization abnormality Confirmed by Quintella Reichert [phone]) on 12/02/2019 4:12:12 PM   Radiology CT Angio Chest PE W/Cm &/Or Wo Cm  Result Date: 12/02/2019 CLINICAL DATA:  Shortness of breath, radiograph shows possible pneumonia EXAM: CT ANGIOGRAPHY CHEST WITH CONTRAST TECHNIQUE: Multidetector CT imaging of the chest was performed using the standard protocol during bolus administration of intravenous contrast. Multiplanar CT image reconstructions and MIPs were obtained to evaluate the vascular anatomy. CONTRAST:  158mL OMNIPAQUE IOHEXOL 350 MG/ML SOLN COMPARISON:  None. FINDINGS: Cardiovascular: There is a optimal opacification of the pulmonary arteries. There is partially occlusive thrombus seen within the bifurcation of the segmental anterior and posterior right lower lobe subsegmental arterial branch. There is also partially occlusive thrombus seen within the of anterior left lower lobe subsegmental pulmonary arterial branches. No pulmonary embolism seen within the main central pulmonary artery. There is mild cardiomegaly. No pericardial effusion or thickening. No evidence right heart strain. There is normal three-vessel brachiocephalic anatomy without proximal stenosis. Coronary artery calcifications are seen. Scattered mild aortic atherosclerosis is noted. Mediastinum/Nodes: No hilar, mediastinal, or axillary adenopathy. Thyroid gland, trachea, and esophagus demonstrate no significant findings.  Lungs/Pleura: Multifocal patchy airspace opacities are seen throughout both lungs, predominantly within the periphery and at the lower lungs. No pleural effusions are seen. Upper Abdomen: No acute abnormalities present in the visualized portions of the upper abdomen. Musculoskeletal: No chest wall abnormality. No acute or significant osseous findings. Review of the MIP images confirms the above findings. IMPRESSION: 1. Partially occlusive thrombus within the bifurcation of the segmental anterior posterior right lower lobe pulmonary arteries as well as within the subsegmental anterior left lower lobe pulmonary arterial. 2. No evidence of right ventricular heart strain. 3. Multifocal patchy airspace opacities throughout both lungs, likely consistent with multifocal pneumonia 4. Mild aortic Atherosclerosis (ICD10-I70.0). These results were called by telephone at the time of interpretation on 12/02/2019 at 8:30 pm to provider [facility] , who verbally acknowledged these results. Electronically Signed   By: Prudencio Pair M.D.   On: 12/02/2019 20:37   DG Chest Port 1 View  Result Date: 12/02/2019 CLINICAL DATA:  Shortness of breath EXAM: PORTABLE CHEST 1 VIEW COMPARISON:  Radiograph 04/19/2013 FINDINGS: Coarse interstitial and patchy airspace opacities present in the bilateral lung bases with some more streaky atelectatic changes. Prominence of the cardiomediastinal silhouette may be portable technique related compared to prior PA radiography. No visible pneumothorax. Suspect trace right effusion. No left effusion. No acute osseous or soft tissue abnormality. Degenerative changes are present in the imaged spine and shoulders. Telemetry leads overlie the chest. Pacer pads overlie the chest wall. IMPRESSION: Coarse  interstitial and patchy airspace opacities in the bilateral lung bases with some more streaky atelectatic changes. Findings could reflect infection, aspiration or edema. Electronically Signed   By: Lovena Le M.D.   On: 12/02/2019 16:53    Procedures Procedures (including critical care time) CRITICAL CARE Performed by: Quintella Reichert   Total critical care time: 60 minutes  Critical care time was exclusive of separately billable procedures and treating other patients.  Critical care was necessary to treat or prevent imminent or life-threatening deterioration.  Critical care was time spent personally by me on the following activities: development of treatment plan with patient and/or surrogate as well as nursing, discussions with consultants, evaluation of patient's response to treatment, examination of patient, obtaining history from patient or surrogate, ordering and performing treatments and interventions, ordering and review of laboratory studies, ordering and review of radiographic studies, pulse oximetry and re-evaluation of patient's condition.  Medications Ordered in ED Medications  insulin regular, human (MYXREDLIN) 100 units/ 100 mL infusion (7 Units/hr Intravenous Rate/Dose Change 12/02/19 2310)  0.9 %  sodium chloride infusion ( Intravenous Stopped 12/02/19 2318)  dextrose 5 %-0.45 % sodium chloride infusion ( Intravenous New Bag/Given 12/02/19 2319)  dextrose 50 % solution 0-50 mL (has no administration in time range)  heparin ADULT infusion 100 units/mL (25000 units/263mL sodium chloride 0.45%) (1,350 Units/hr Intravenous New Bag/Given 12/02/19 2124)  clopidogrel (PLAVIX) tablet 75 mg (has no administration in time range)  remdesivir 200 mg in sodium chloride 0.9% 250 mL IVPB (has no administration in time range)    Followed by  remdesivir 100 mg in sodium chloride 0.9 % 100 mL IVPB (has no administration in time range)  dexamethasone (DECADRON) injection 6 mg (6 mg Intravenous Given 12/02/19 2230)  docusate sodium (COLACE) capsule 100 mg (has no administration in time range)  polyethylene glycol (MIRALAX / GLYCOLAX) packet 17 g (has no administration in time range)    cefTRIAXone (ROCEPHIN) 1 g in sodium chloride 0.9 % 100 mL IVPB (has no administration in time range)  azithromycin (ZITHROMAX) 500 mg in sodium chloride 0.9 % 250 mL IVPB (has no administration in time range)  aspirin tablet 325 mg (has no administration in time range)  metoprolol tartrate (LOPRESSOR) injection 2.5 mg (2.5 mg Intravenous Given 12/02/19 1554)  metoprolol tartrate (LOPRESSOR) injection 2.5 mg (2.5 mg Intravenous Given 12/02/19 1601)  iohexol (OMNIPAQUE) 350 MG/ML injection 100 mL (100 mLs Intravenous Contrast Given 12/02/19 2008)  heparin bolus via infusion 5,000 Units (5,000 Units Intravenous Bolus from Bag 12/02/19 2125)    ED Course  I have reviewed the triage vital signs and the nursing notes.  Pertinent labs & imaging results that were available during my care of the patient were reviewed by me and considered in my medical decision making (see chart for details).    MDM Rules/Calculators/A&P                     Patient here for evaluation of increased shortness of breath, chest pain today. On initial assessment patient ill appearing with tachycardia, tachypnea. He was treated with Lopressor for new onset a fib with RVR. After two, 2.5 mg doses he cardioverter to sinus rhythm. He did state that he had improvement in his symptoms after improvement in his heart rate. He does although have ongoing shortness of breath. He was treated with empiric antibiotics for possible pneumonia pending workup. His lactic acid is noted to be elevated and this is felt to be secondary  to his COVID-19 infection and underlying rapid atrial fibrillation over sepsis.  CTA was performed, which demonstrates bilateral pulmonary embolism's. He was started on heparin drip for PE's, and nSTEMI and atrial fibrillation. He is also noted to be hypeglycemic, with normal pH, decreased bicarb. He was started on an insulin drip for severe hyperglycemia. IV fluids were withheld due to concern for acute heart failure  as well as COVID-19 infection. Discussed with patient findings of studies and recommendation for admission and he is in agreement with treatment plan. Critical care consulted for admission for further treatment.  [place] was evaluated in Emergency Department on 12/02/2019 for the symptoms described in the history of present illness. He was evaluated in the context of the global COVID-19 pandemic, which necessitated consideration that the patient might be at risk for infection with the SARS-CoV-2 virus that causes COVID-19. Institutional protocols and algorithms that pertain to the evaluation of patients at risk for COVID-19 are in a state of rapid change based on information released by regulatory bodies including the CDC and federal and state organizations. These policies and algorithms were followed during the patient's care in the ED.   Final Clinical Impression(s) / ED Diagnoses Final diagnoses:  New onset a-fib ([place])  COVID-19 virus infection  Multiple subsegmental pulmonary emboli without acute cor pulmonale (HCC)  NSTEMI (non-ST elevated myocardial infarction) [facility])    Rx / DC Orders ED Discharge Orders    None       Quintella Reichert, MD 12/02/19 2340

## 2019-12-02 NOTE — ED Notes (Signed)
This RN attempted twice to insert a third PIV for a separate infusion; this RN was unsuccessful

## 2019-12-02 NOTE — ED Notes (Signed)
[place] Pads Removed

## 2019-12-03 ENCOUNTER — Inpatient Hospital Stay (HOSPITAL_COMMUNITY): Payer: Medicare Other

## 2019-12-03 DIAGNOSIS — R079 Chest pain, unspecified: Secondary | ICD-10-CM

## 2019-12-03 DIAGNOSIS — I480 Paroxysmal atrial fibrillation: Secondary | ICD-10-CM

## 2019-12-03 LAB — CBC
HCT: 36.4 % — ABNORMAL LOW (ref 39.0–52.0)
Hemoglobin: 11.4 g/dL — ABNORMAL LOW (ref 13.0–17.0)
MCH: 27.9 pg (ref 26.0–34.0)
MCHC: 31.3 g/dL (ref 30.0–36.0)
MCV: 89.2 fL (ref 80.0–100.0)
Platelets: 309 10*3/uL (ref 150–400)
RBC: 4.08 MIL/uL — ABNORMAL LOW (ref 4.22–5.81)
RDW: 13.6 % (ref 11.5–15.5)
WBC: 11 10*3/uL — ABNORMAL HIGH (ref 4.0–10.5)
nRBC: 0 % (ref 0.0–0.2)

## 2019-12-03 LAB — BASIC METABOLIC PANEL
Anion gap: 12 (ref 5–15)
BUN: 29 mg/dL — ABNORMAL HIGH (ref 8–23)
CO2: 21 mmol/L — ABNORMAL LOW (ref 22–32)
Calcium: 8.6 mg/dL — ABNORMAL LOW (ref 8.9–10.3)
Chloride: 114 mmol/L — ABNORMAL HIGH (ref 98–111)
Creatinine, Ser: 1.03 mg/dL (ref 0.61–1.24)
GFR calc Af Amer: >60 mL/min (ref >60)
GFR calc non Af Amer: >60 mL/min (ref >60)
Glucose, Bld: 150 mg/dL — ABNORMAL HIGH (ref 70–99)
Potassium: 4.8 mmol/L (ref 3.5–5.1)
Sodium: 147 mmol/L — ABNORMAL HIGH (ref 135–145)

## 2019-12-03 LAB — URINE CULTURE: Culture: <10000 — AB

## 2019-12-03 LAB — BETA-HYDROXYBUTYRIC ACID: Beta-Hydroxybutyric Acid: 0.15 mmol/L (ref 0.05–0.27)

## 2019-12-03 LAB — GLUCOSE, CAPILLARY
Glucose-Capillary: 141 mg/dL — ABNORMAL HIGH (ref 70–99)
Glucose-Capillary: 155 mg/dL — ABNORMAL HIGH (ref 70–99)
Glucose-Capillary: 155 mg/dL — ABNORMAL HIGH (ref 70–99)
Glucose-Capillary: 156 mg/dL — ABNORMAL HIGH (ref 70–99)
Glucose-Capillary: 159 mg/dL — ABNORMAL HIGH (ref 70–99)
Glucose-Capillary: 165 mg/dL — ABNORMAL HIGH (ref 70–99)
Glucose-Capillary: 166 mg/dL — ABNORMAL HIGH (ref 70–99)
Glucose-Capillary: 171 mg/dL — ABNORMAL HIGH (ref 70–99)
Glucose-Capillary: 175 mg/dL — ABNORMAL HIGH (ref 70–99)
Glucose-Capillary: 177 mg/dL — ABNORMAL HIGH (ref 70–99)
Glucose-Capillary: 187 mg/dL — ABNORMAL HIGH (ref 70–99)
Glucose-Capillary: 202 mg/dL — ABNORMAL HIGH (ref 70–99)
Glucose-Capillary: 219 mg/dL — ABNORMAL HIGH (ref 70–99)
Glucose-Capillary: 261 mg/dL — ABNORMAL HIGH (ref 70–99)

## 2019-12-03 LAB — MAGNESIUM: Magnesium: 2.3 mg/dL (ref 1.7–2.4)

## 2019-12-03 LAB — T4, FREE: Free T4: 1.38 ng/dL — ABNORMAL HIGH (ref 0.61–1.12)

## 2019-12-03 LAB — HEPARIN LEVEL (UNFRACTIONATED)
Heparin Unfractionated: 0.29 IU/mL — ABNORMAL LOW (ref 0.30–0.70)
Heparin Unfractionated: 0.47 IU/mL (ref 0.30–0.70)
Heparin Unfractionated: 0.75 IU/mL — ABNORMAL HIGH (ref 0.30–0.70)

## 2019-12-03 LAB — HEMOGLOBIN A1C
Hgb A1c MFr Bld: 9.4 % — ABNORMAL HIGH (ref 4.8–5.6)
Mean Plasma Glucose: 223.08 mg/dL

## 2019-12-03 LAB — TROPONIN I (HIGH SENSITIVITY)
Troponin I (High Sensitivity): 4256 ng/L (ref <18)
Troponin I (High Sensitivity): 3836 ng/L (ref <18)
Troponin I (High Sensitivity): 2610 ng/L (ref <18)
Troponin I (High Sensitivity): 2812 ng/L (ref <18)

## 2019-12-03 LAB — TSH: TSH: 1.228 u[IU]/mL (ref 0.350–4.500)

## 2019-12-03 LAB — MRSA PCR SCREENING: MRSA by PCR: NEGATIVE

## 2019-12-03 LAB — PHOSPHORUS: Phosphorus: 1.7 mg/dL — ABNORMAL LOW (ref 2.5–4.6)

## 2019-12-03 MED ORDER — INSULIN ASPART 100 UNIT/ML ~~LOC~~ SOLN
0.0000 [IU] | SUBCUTANEOUS | Status: DC
  Administered 2019-12-03 (×2): 4 [IU] via SUBCUTANEOUS
  Administered 2019-12-03: 7 [IU] via SUBCUTANEOUS
  Administered 2019-12-03: 11 [IU] via SUBCUTANEOUS
  Administered 2019-12-04: 4 [IU] via SUBCUTANEOUS
  Administered 2019-12-04: 7 [IU] via SUBCUTANEOUS
  Administered 2019-12-04: 11 [IU] via SUBCUTANEOUS
  Administered 2019-12-04: 4 [IU] via SUBCUTANEOUS
  Administered 2019-12-04 – 2019-12-05 (×2): 7 [IU] via SUBCUTANEOUS
  Administered 2019-12-05: 4 [IU] via SUBCUTANEOUS
  Administered 2019-12-05: 7 [IU] via SUBCUTANEOUS

## 2019-12-03 MED ORDER — CHLORHEXIDINE GLUCONATE CLOTH 2 % EX PADS
6.0000 | MEDICATED_PAD | Freq: Every day | CUTANEOUS | Status: DC
  Administered 2019-12-03 – 2019-12-11 (×7): 6 via TOPICAL

## 2019-12-03 MED ORDER — ROSUVASTATIN CALCIUM 20 MG PO TABS
40.0000 mg | ORAL_TABLET | Freq: Every day | ORAL | Status: DC
  Administered 2019-12-03 – 2019-12-07 (×5): 40 mg via ORAL
  Filled 2019-12-03: qty 8
  Filled 2019-12-03: qty 2
  Filled 2019-12-03 (×3): qty 8

## 2019-12-03 MED ORDER — SODIUM CHLORIDE 0.9 % IV SOLN
2.0000 g | INTRAVENOUS | Status: DC
  Administered 2019-12-03 – 2019-12-04 (×2): 2 g via INTRAVENOUS
  Filled 2019-12-03 (×2): qty 20

## 2019-12-03 MED ORDER — SODIUM CHLORIDE 0.9 % IV SOLN
100.0000 mg | Freq: Two times a day (BID) | INTRAVENOUS | Status: DC
  Administered 2019-12-04 – 2019-12-05 (×3): 100 mg via INTRAVENOUS
  Filled 2019-12-03 (×4): qty 100

## 2019-12-03 MED ORDER — INSULIN DETEMIR 100 UNIT/ML ~~LOC~~ SOLN
10.0000 [IU] | Freq: Every day | SUBCUTANEOUS | Status: DC
  Administered 2019-12-03: 10 [IU] via SUBCUTANEOUS
  Filled 2019-12-03 (×2): qty 0.1

## 2019-12-03 MED ORDER — SODIUM CHLORIDE 0.9 % IV SOLN
INTRAVENOUS | Status: DC | PRN
  Administered 2019-12-03: 250 mL via INTRAVENOUS

## 2019-12-03 MED ORDER — POTASSIUM & SODIUM PHOSPHATES 280-160-250 MG PO PACK
1.0000 | PACK | Freq: Three times a day (TID) | ORAL | Status: DC
  Administered 2019-12-03 – 2019-12-06 (×16): 1 via ORAL
  Filled 2019-12-03 (×22): qty 1

## 2019-12-03 MED ORDER — ORAL CARE MOUTH RINSE
15.0000 mL | Freq: Two times a day (BID) | OROMUCOSAL | Status: DC
  Administered 2019-12-03 – 2019-12-11 (×17): 15 mL via OROMUCOSAL

## 2019-12-03 NOTE — Progress Notes (Signed)
[place] for heparin Indication: pulmonary embolus, new afib, NSTEMI  Heparin dosing weight - 72.9 kg  Assessment: 74 year old male presenting with SOB found to be COVID+ with acute pulmonary embolism. Also noted new-onset afib and NSTEMI. Pharmacy consulted for heparin dosing. Patient not on any anticoagulation prior to admission.   Heparin level has continued to rise throughout the day on same heparin drip rate 1500 uts/hr HL 0.47>0.75.  Confirmed with RN that heparin in running through peripheral IV and lab drawn in opposite arm.  Slow trend down HGB 13> 11.4, plt wnl, No bleed issues noted. Decrease heparin drip slightly to bring HL down into therapeutic range but without dropping to low with PE, + trop  Goal of Therapy:  Heparin level 0.3-0.7 units/ml Monitor platelets by anticoagulation protocol: Yes   Plan:  Decrease heparin infusion to 1300 units/hour Monitor daily heparin level and CBC, s/sx bleeding F/u long-term plan for anticoagulation as appropriate    Bonnita Nasuti Pharm.D. CPP, BCPS Clinical Pharmacist [phone] 12/03/2019 6:53 PM

## 2019-12-03 NOTE — Progress Notes (Signed)
eLink Physician-Brief Progress Note Patient Name: Patrick Miranda DOB: 1946-06-05 MRN: [id number]   Date of Service  12/03/2019  HPI/Events of Note  Able to tolerated sips of water. Patient is on PO medications.   eICU Interventions  Will change to NPO, except sips with meds.         Patrick Miranda [name] 12/03/2019, 3:50 AM

## 2019-12-03 NOTE — Progress Notes (Signed)
NAME:  Patrick Miranda, MRN:  [id number], DOB:  [birth year], LOS: 1 ADMISSION DATE:  12/02/2019, CONSULTATION DATE:  12/03/19 REFERRING MD:  EDP, CHIEF COMPLAINT:  PNA   Brief History   74 year old male presented with several days of shortness of breath and developed chest pain on 5/14.  He was found to have Covid pneumonia, new onset atrial fibrillation with RVR, and bilateral pulmonary emboli.  PCCM consulted for admission.  History of present illness   Patrick Miranda is a 74 year old male with past medical history of CVA, type 2 diabetes, hyperlipidemia, hypertension, OSA compliant with CPAP who was exposed to Covid-19 around 5/1 then developed shortness of breath 3 days ago.  This progressively worsened and he developed central chest pain today so presented to the emergency department.  He has not received any Covid-19 vaccination.    In the ED, he was initially in atrial fibrillation with RVR with heart rate in the 140s and borderline low blood pressure.  Oxygen saturations 88%.  He was placed on nasal cannula oxygen and given IV metoprolol.  Chest x-ray showed patchy airspace opacities, CTA chest showed PE in the segmental right lower lobe PA and subsegmental left lower lobe PE without evidence of right heart strain.  Covid-19 results were positive along with elevated troponin 4,545, BNP 439, and lactic acid 4.5, WBC 14.3, glucose also significantly elevated >500 with bicarb of 15 and anion gap 19.  EKG without evidence of acute ischemia. Pt was started on a heparin gtt and insulin gtt and PCCM consulted for admission.  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  Significant Hospital Events   5/14 Admit to PCCM>>  Consults:  cardiology  Procedures:  n/a  Significant Diagnostic Tests:   5/14 CXR>>Coarse interstitial and patchy airspace opacities in the bilateral lung bases 5/14 CTA chest>>Partially occlusive thrombus within the bifurcation of the segmental anterior posterior right lower lobe pulmonary arteries as well as within the subsegmental anterior left lower lobe pulmonary arterial.  Micro Data:  12/03/19 Sars-Cov-2>>positive  12/03/19 Blood cultures>> 12/03/19 UC>>  Antimicrobials:   Ceftriaxone 5/14- Azithromycin 5/14-  Interim history/subjective:  Pt awake and alert and stable on [place]; denies chest pain or SOB; reports "a little dizzy when I tried to move around".   Objective   Blood pressure 139/65, pulse (!) 53, temperature 97.7 F (36.5 C), temperature source Oral, resp. rate (!) 23, height 5\' 10"  (1.778 m), weight 72.9 kg, SpO2 92 %.        Intake/Output Summary (Last 24 hours) at 12/03/2019 1242 Last data filed at 12/03/2019 1200 Gross per 24 hour  Intake 1074.22 ml  Output 355 ml  Net 719.22 ml   Filed Weights   12/02/19 2000 12/03/19 0045  Weight: 79.4 kg 72.9 kg    General: Elderly, well-nourished male alert and conversational in no distress HEENT: MM pink/moist Neuro: Awake alert oriented x3, following commands and conversational CV: s1s2 regular rate and rhythm, no m/r/g PULM: Mild rhonchi bilateral bases, oxygen saturations greater than 92% on 10 L nasal cannula without tachypnea or retractions GI: soft, bsx4 active  Extremities: warm/dry, no edema  Skin: no rashes or lesions  Resolved Hospital Problem list   Afib with RVR  Assessment & Plan:    Acute hypoxic respiratory failure secondary to Covid-19 pneumonia and bilateral PE -Pt feeling much  improved since arrival, saturating well on 10 L nasal cannula -No right heart strain on CTA P: -Continue heparin drip -Start remdesivir and dexamethasone, no indication for Actemra -Supplemental oxygen as needed to maintain sats> 92%, patient would like to be intubated if needed -Follow  blood cultures and cover for possible concurrent bacterial infection with azithromycin and ceftriaxone -Echocardiogram   Chest Pain with elevated troponin -secondary to PE vs NSTEMI -chest pain resolved without nitro or morphine P: -Continue heparin drip, significant troponin jump from 300->4500 -Give aspirin 325 mg, continue home Plavix tomorrow -trending troponin (down from 4543-->3836 last)   New onset atrial fibrillation with RVR -Converted to sinus rhythm in the ED -Likely secondary to acute issues as above P: -appreciate cardiology input -rate control, anticoagulation   Type 2 diabetes with hyperglycemia -Insulin drip initiated in the ED -pH 7.34 P: -transition to [place], PO glycemic control -A1c -Diabetic diet   Best practice:  Diet: diabetic  Pain/Anxiety/Delirium protocol (if indicated): N/A VAP protocol (if indicated): N/A DVT prophylaxis: Heparin drip, start Plavix, ASA GI prophylaxis: PPI Glucose control: transition to PO/Gutierrez as now closer to normal limits Mobility: Bedrest Code Status: Full code Family Communication: We will try to reach wife Disposition: ICU  Labs   CBC: Recent Labs  Lab 12/02/19 1548 12/02/19 1604 12/02/19 1622 12/03/19 0250  WBC 14.3*  --   --  11.0*  NEUTROABS 12.2*  --   --   --   HGB 13.0 14.3 12.6* 11.4*  HCT 44.0 42.0 37.0* 36.4*  MCV 95.4  --   --  89.2  PLT 438*  --   --  [id number]    Basic Metabolic Panel: Recent Labs  Lab 12/02/19 1548 12/02/19 1604 12/02/19 1622 12/02/19 2212 12/03/19 0250  NA 141 140 139  --  147*  K 5.4* 4.9 4.8  --  4.8  CL 108 111  --   --  114*  CO2 14*  --   --   --  21*  GLUCOSE 556* 547*  --   --  150*  BUN 27* 28*  --   --  29*  CREATININE 1.72* 1.30*  --   --  1.03  CALCIUM 8.9  --   --   --  8.6*  MG 2.1  --   --  2.4 2.3  PHOS  --   --   --   --  1.7*   GFR: Estimated Creatinine Clearance: 64.9 mL/min (by C-G formula based on SCr of 1.03 mg/dL). Recent Labs  Lab 12/02/19 1548  12/02/19 1558 12/02/19 1715 12/02/19 1816 12/03/19 0250  PROCALCITON  --  0.13  --   --   --   WBC 14.3*  --   --   --  11.0*  LATICACIDVEN  --   --  4.5* 3.2*  --     Liver Function Tests: Recent Labs  Lab 12/02/19 1548  AST 39  ALT 13  ALKPHOS 63  BILITOT 1.6*  PROT 6.7  ALBUMIN 2.2*   No results for input(s): LIPASE, AMYLASE in the last 168 hours. No results for input(s): AMMONIA in the last 168 hours.  ABG    Component Value Date/Time   PHART 7.343 (L) 12/02/2019 1622   PCO2ART 25.4 (L) 12/02/2019 1622   PO2ART 60 (L) 12/02/2019 1622   HCO3 13.8 (L) 12/02/2019 1622   TCO2 15 (L) 12/02/2019 1622   ACIDBASEDEF 10.0 (H) 12/02/2019 1622   O2SAT 90.0 12/02/2019 1622  Coagulation Profile: No results for input(s): INR, PROTIME in the last 168 hours.  Cardiac Enzymes: No results for input(s): CKTOTAL, CKMB, CKMBINDEX, TROPONINI in the last 168 hours.  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   Surgical History    Past Surgical History:  Procedure Laterality Date  . COLONOSCOPY W/ POLYPECTOMY    . TOTAL KNEE ARTHROPLASTY Left 04/22/2013   Procedure: TOTAL KNEE ARTHROPLASTY- left;  Surgeon: Augustin Schooling, MD;  Location: [facility];  Service: Orthopedics;  Laterality: Left;  with femoral block  . TOTAL KNEE ARTHROPLASTY Right  01/06/2014   Procedure: RIGHT TOTAL KNEE ARTHROPLASTY;  Surgeon: Augustin Schooling, MD;  Location: [facility];  Service: Orthopedics;  Laterality: Right;     Social History   reports that he has never smoked. He has never used smokeless tobacco. He reports that he does not drink alcohol or use drugs.   Family History   His family history is not on file.   Allergies Allergies  Allergen Reactions  . Lipitor [Atorvastatin] Other (See Comments)    Muscle pain     Home Medications  Prior to Admission medications   Medication Sig Start Date End Date Taking? Authorizing Provider  BD PEN NEEDLE NANO U/F 32G X 4 MM MISC 1 each by Other route daily.  03/19/18  Yes [provider]  carvedilol (COREG) 12.5 MG tablet Take 12.5 mg by mouth 2 (two) times daily with a meal.   Yes [provider]  clopidogrel (PLAVIX) 75 MG tablet Take 75 mg by mouth daily with breakfast.   Yes [provider]  Empagliflozin-Metformin HCl (SYNJARDY) 11-998 MG TABS Take 1 tablet by mouth 2 (two) times daily.   Yes [provider]  fenofibrate 160 MG tablet Take 160 mg by mouth daily.   Yes [provider]  HUMALOG KWIKPEN 100 UNIT/ML KwikPen Inject 10 Units into the skin with breakfast, with lunch, and with evening meal.  04/27/18  Yes [provider]  losartan-hydrochlorothiazide (HYZAAR) 50-12.5 MG per tablet Take 1 tablet by mouth daily.   Yes [provider]  methylphenidate (RITALIN) 5 MG tablet Take 1 tablet (5 mg total) by mouth 2 (two) times daily. If needed for sleepiness Patient taking differently: Take 5 mg by mouth once a week. On sundays 07/13/19  Yes Young, Kasandra Knudsen, MD  [facility] VERIO test strip 1 each by Other route in the morning, at noon, and at bedtime.  03/20/18  Yes [provider]  TOUJEO SOLOSTAR 300 UNIT/ML SOPN Inject 30 Units into the skin daily.  06/25/18  Yes [provider]  ipratropium (ATROVENT) 0.03 % nasal spray 1-2  sprays each nostril every 8-12 hours if needed  Patient not taking: Reported on 12/02/2019 07/10/17   Deneise Lever, MD     I have independently seen and examined the patient, reviewed data, and developed an assessment and plan. A total of 42 minutes were spent in critical care assessment and medical decision making. This critical care time does not reflect procedure time, or teaching time or supervisory time of PA/NP/Med student/Med Resident, etc but could involve care discussion time.    Bonna Gains, MD, PhD 12/03/19 12:51 PM

## 2019-12-03 NOTE — Consult Note (Signed)
Cardiology Consult    Patient ID: [place] MRN: [id number], DOB/AGE: 08-08-1945   Admit date: 12/02/2019 Date of Consult: 12/03/2019  Primary Physician: Burnard Bunting, MD Primary Cardiologist: None Requesting Provider: Terrence Dupont MD  Patient Profile    Patrick Miranda is a 74 y.o. male with a history of CVA on ASA/Plavix, T2DM, HLD, HTN, OSA who presented for worsening chest pain and shortness of breath .   History of Present Illness    Mid-morning yesterday Patrick Miranda developed acute shortness of breath and substernal chest pain that was much worse than the mild shortness of breath he has been dealing with for the last three days.  He knows of a sick exposure with COVID19 on 5/1.  Given chest pressure he decided to present to the ED.  He reports that this chest pain was very brief and resolved within minutes of its initial occurrence.  He denies active chest pain.  Prior to my evaluation he presented to ED in AF RVR w/ HR to 0000000 and systolic in the 0000000 with low oxygen saturation.  He responded with rhythm conversion to metoprolol.  CXR demonstrates infiltrates and CTA demonstrated segmental and subsegmetnal pulmonary embolism without evidence of right heart strain.  We are asked to evaluate given elevated tropoponin and chest pain.  Past Medical History   Past Medical History:  Diagnosis Date  . Arthritis   . 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 Cerebral artery disease   . Depression   . Diabetes mellitus   . Hernia, umbilical   . HOH (hard of hearing)   . Hyperlipidemia   . Hypertension   . Sleep apnea    on cpap at home    Past Surgical History:  Procedure Laterality Date  . COLONOSCOPY W/ POLYPECTOMY    . TOTAL KNEE ARTHROPLASTY Left 04/22/2013   Procedure: TOTAL KNEE ARTHROPLASTY- left;  Surgeon: Augustin Schooling, MD;  Location: [facility];  Service: Orthopedics;  Laterality: Left;  with femoral block  . TOTAL KNEE  ARTHROPLASTY Right 01/06/2014   Procedure: RIGHT TOTAL KNEE ARTHROPLASTY;  Surgeon: Augustin Schooling, MD;  Location: [place];  Service: Orthopedics;  Laterality: Right;     Allergies  Allergen Reactions  . Lipitor [Atorvastatin] Other (See Comments)    Muscle pain   Inpatient Medications    . aspirin  325 mg Oral Once  . Chlorhexidine Gluconate Cloth  6 each Topical Daily  . clopidogrel  75 mg Oral Q breakfast  . dexamethasone (DECADRON) injection  6 mg Intravenous Q24H  . mouth rinse  15 mL Mouth Rinse BID  . rosuvastatin  40 mg Oral Daily  . sodium chloride flush  10-40 mL Intracatheter Q12H    Family History    Family history reviewed and non-contributory  Social History    Social History   Socioeconomic History  . Marital status: Married    Spouse name: Not on file  . Number of children: Not on file  . Years of education: Not on file  . Highest education level: Not on file  Occupational History  . Not on file  Tobacco Use  . Smoking status: Never Smoker  . Smokeless tobacco: Never Used  Substance and Sexual Activity  . Alcohol use: No  . Drug use: No  . Sexual activity: Yes  Other Topics Concern  . Not on file  Social History Narrative  . Not on file   Social Determinants of  Health   Financial Resource Strain:   . Difficulty of Paying Living Expenses:   Food Insecurity:   . Worried About Charity fundraiser in the Last Year:   . Arboriculturist in the Last Year:   Transportation Needs:   . Film/video editor (Medical):   Marland Kitchen Lack of Transportation (Non-Medical):   Physical Activity:   . Days of Exercise per Week:   . Minutes of Exercise per Session:   Stress:   . Feeling of Stress :   Social Connections:   . Frequency of Communication with Friends and Family:   . Frequency of Social Gatherings with Friends and Family:   . Attends Religious Services:   . Active Member of Clubs or Organizations:   . Attends Archivist Meetings:   Marland Kitchen  Marital Status:   Intimate Partner Violence:   . Fear of Current or Ex-Partner:   . Emotionally Abused:   Marland Kitchen Physically Abused:   . Sexually Abused:      Review of Systems    General:  No chills, fever, night sweats or weight changes.  Cardiovascular:  No chest pain, dyspnea on exertion, edema, orthopnea, palpitations, paroxysmal nocturnal dyspnea. Dermatological: No rash, lesions/masses Respiratory: No cough, dyspnea Urologic: No hematuria, dysuria Abdominal:   No nausea, vomiting, diarrhea, bright red blood per rectum, melena, or hematemesis Neurologic:  No visual changes, wkns, changes in mental status. All other systems reviewed and are otherwise negative except as noted above.  Physical Exam    Blood pressure 122/66, pulse (!) 54, temperature 98.3 F (36.8 C), temperature source Oral, resp. rate (!) 28, height 5\' 10"  (1.778 m), weight 72.9 kg, SpO2 90 %.     Intake/Output Summary (Last 24 hours) at 12/03/2019 0341 Last data filed at 12/03/2019 0200 Gross per 24 hour  Intake 459.54 ml  Output 135 ml  Net 324.54 ml   Wt Readings from Last 3 Encounters:  12/03/19 72.9 kg  07/12/18 81.6 kg  07/10/17 78.9 kg    CONSTITUTIONAL: alert and conversant, well-appearing, nourished, no distress HEENT: oropharynx clear and moist, no mucosal lesions, normal dentition, conjunctiva normal, EOM intact, pupils equal, no lid lag. NECK: supple, no cervical adenopathy, no thyromegaly CARDIOVASCULAR: Regular rhythm. No gallop, murmur, or rub. Normal S1/S2. Radial pulses intact. JVP flat. No carotid bruits. PULMONARY/CHEST WALL: no deformities, normal breath sounds bilaterally, normal work of breathing ABDOMINAL: soft, non-tender, non-distended EXTREMITIES: no edema or muscle atrophy, warm and well-perfused SKIN: Dry and intact without apparent rashes or wounds. Wound: absent NEUROLOGIC: alert, normal gait, no abnormal movements, cranial nerves grossly intact.   Labs  Reviewed and  significant for: 333 --> 4543 over 3 hours D Dimer >200 Lactate 4.5 --->3.2  Cr 1.7 from 1.6 with anion gap present, TBili 1.6 14.3>13<438 A1C 9.4% SARS COV2 positive Radiology Studies    CT Angio Chest PE W/Cm &/Or Wo Cm  Result Date: 12/02/2019 CLINICAL DATA:  Shortness of breath, radiograph shows possible pneumonia EXAM: CT ANGIOGRAPHY CHEST WITH CONTRAST TECHNIQUE: Multidetector CT imaging of the chest was performed using the standard protocol during bolus administration of intravenous contrast. Multiplanar CT image reconstructions and MIPs were obtained to evaluate the vascular anatomy. CONTRAST:  117mL OMNIPAQUE IOHEXOL 350 MG/ML SOLN COMPARISON:  None. FINDINGS: Cardiovascular: There is a optimal opacification of the pulmonary arteries. There is partially occlusive thrombus seen within the bifurcation of the segmental anterior and posterior right lower lobe subsegmental arterial branch. There is also partially occlusive thrombus  seen within the of anterior left lower lobe subsegmental pulmonary arterial branches. No pulmonary embolism seen within the main central pulmonary artery. There is mild cardiomegaly. No pericardial effusion or thickening. No evidence right heart strain. There is normal three-vessel brachiocephalic anatomy without proximal stenosis. Coronary artery calcifications are seen. Scattered mild aortic atherosclerosis is noted. Mediastinum/Nodes: No hilar, mediastinal, or axillary adenopathy. Thyroid gland, trachea, and esophagus demonstrate no significant findings. Lungs/Pleura: Multifocal patchy airspace opacities are seen throughout both lungs, predominantly within the periphery and at the lower lungs. No pleural effusions are seen. Upper Abdomen: No acute abnormalities present in the visualized portions of the upper abdomen. Musculoskeletal: No chest wall abnormality. No acute or significant osseous findings. Review of the MIP images confirms the above findings. IMPRESSION: 1.  Partially occlusive thrombus within the bifurcation of the segmental anterior posterior right lower lobe pulmonary arteries as well as within the subsegmental anterior left lower lobe pulmonary arterial. 2. No evidence of right ventricular heart strain. 3. Multifocal patchy airspace opacities throughout both lungs, likely consistent with multifocal pneumonia 4. Mild aortic Atherosclerosis (ICD10-I70.0). These results were called by telephone at the time of interpretation on 12/02/2019 at 8:30 pm to provider Patrick Miranda , who verbally acknowledged these results. Electronically Signed   By: Prudencio Pair M.D.   On: 12/02/2019 20:37   DG Chest Port 1 View  Result Date: 12/02/2019 CLINICAL DATA:  Shortness of breath EXAM: PORTABLE CHEST 1 VIEW COMPARISON:  Radiograph 04/19/2013 FINDINGS: Coarse interstitial and patchy airspace opacities present in the bilateral lung bases with some more streaky atelectatic changes. Prominence of the cardiomediastinal silhouette may be portable technique related compared to prior PA radiography. No visible pneumothorax. Suspect trace right effusion. No left effusion. No acute osseous or soft tissue abnormality. Degenerative changes are present in the imaged spine and shoulders. Telemetry leads overlie the chest. Pacer pads overlie the chest wall. IMPRESSION: Coarse interstitial and patchy airspace opacities in the bilateral lung bases with some more streaky atelectatic changes. Findings could reflect infection, aspiration or edema. Electronically Signed   By: Lovena Le M.D.   On: 12/02/2019 16:53    ECG & Cardiac Imaging    ECG: AF w/ RVR with intraventricular conduction delay. Post-conversion with normal sinus rhtyhm, inferior T-wave inversions and diffuse non-specific ST changes. Inferior TWI seen in 04/2013 EKG.  Assessment & Plan   \Patrick Miranda is a 74 y.o. male with a history of CVA on ASA/Plavix, T2DM, HLD, HTN, OSA who presented for chest pain in the  setting of COVID19 pneumonia and pulmonary embolism with rise in troponin.  He is chest pain free at this point without intervention.  Troponin trended up 333-->4543 but EKG is without overt ischemic changes and he is chest pain free.  Aside from ischemia, differential for troponin elevation includes direct myocardial injury (myocarditis) and demand ischemia in the setting of AF at presentation, pulmonary embolism, and sepsis.  For now, would continue to treat empirically for Type 1 MI with the heparin and outpatient aspirin and clopidogrel that he is receiving.  Cycle EKG if chest pain and trend troponin until peak value.  Echocardiogram has been ordered which will be informative, though clinically he does not appear to be in heart failure syndrome or cardiogenic shock.  For new onset AF, recommend rate control with beta blocker or digoxin as able and continued anticoagulation.  Recommendations Chest pain and troponin elevation - Continue ASA 81 mg daily and Clopidogrel 75 mg daily - Continue heparin  infusion, ACS nomogram - Obtain TTE - Cycle troponin, EKG if recurrent chest pain  Paroxysmal AF - AF, CV 4 (age, HTN, CVA); recommend long-term anticoagulation pending recovery form this illness. - Hold metoprolol for now while severity of his sepsis and pulmonary embolism declare themselves.  If normal function on echo then can consider introducing for rate control for AF.  In lieu of metoprolol, digoxin loading can be performed if he should have recurrent AF RVR  Signed, Delight Hoh, MD 12/03/2019, 3:41 AM  For questions or updates, please contact   Please consult [email or web address] for contact info under Cardiology/STEMI.

## 2019-12-03 NOTE — Progress Notes (Signed)
[place] for heparin Indication: pulmonary embolus  Assessment: 74 year old male presenting with SOB found to be COVID+ with a pulmonary embolism. Pharmacy consulted for heparin dosing. Patient not on any anticoagulation prior to admission. Hemoglobin slightly low at 12.6, baseline >13.0.  Heparin level 0.29 units/ml  Goal of Therapy:  Heparin level 0.3-0.7 units/ml Monitor platelets by anticoagulation protocol: Yes   Plan:  Increase heparin infusion at 1500 units/hour Check heparin level in 6 hours Daily heparin level and CBC Monitor for signs and symptoms of bleeding  Thanks for allowing pharmacy to be a part of this patient's care.  Excell Seltzer, PharmD Clinical Pharmacist

## 2019-12-03 NOTE — Progress Notes (Signed)
PHARMACY NOTE:  ANTIMICROBIAL DOSAGE ADJUSTMENT  Current antimicrobial regimen includes a mismatch between antimicrobial dosage and indication. As per policy approved by the Pharmacy & Therapeutics and Medical Executive Committees, the antimicrobial dosage will be adjusted accordingly.  Current antimicrobial dosage:  Ceftriaxone 1g IV q24h  Indication: PNA  Antimicrobial dosage has been changed to:  Ceftriaxone 2g IV q24h  Additional comments: Will adjust to higher dose in ICU patient.   Arturo Morton, PharmD, BCPS Please check AMION for all [facility] contact numbers Clinical Pharmacist 12/03/2019 11:07 AM

## 2019-12-03 NOTE — Progress Notes (Signed)
Wife was updated on plan of care and patient status twice throughout the shift. All questions were answered at this time.

## 2019-12-03 NOTE — Progress Notes (Signed)
Patient is currently on No heated high flow at 10L and has a saturation level of around 91%. Unable to do CPAP for needing the O2 he is on at this time. Will monitor situation for any changes during my shift.

## 2019-12-03 NOTE — Progress Notes (Signed)
[place] for heparin Indication: pulmonary embolus, new afib, NSTEMI  Heparin dosing weight - 72.9 kg  Assessment: 74 year old male presenting with SOB found to be COVID+ with acute pulmonary embolism. Also noted new-onset afib and NSTEMI. Pharmacy consulted for heparin dosing. Patient not on any anticoagulation prior to admission.   Heparin level therapeutic this morning at 0.47. Hg down to 11.4, plt wnl. No bleed issues reported.  Goal of Therapy:  Heparin level 0.3-0.7 units/ml Monitor platelets by anticoagulation protocol: Yes   Plan:  Continue heparin infusion at 1500 units/hour Check heparin level in 6 hours to confirm Monitor daily heparin level and CBC, s/sx bleeding F/u long-term plan for anticoagulation as appropriate   Arturo Morton, PharmD, BCPS Please check AMION for all [place] contact numbers Clinical Pharmacist 12/03/2019 10:57 AM

## 2019-12-04 LAB — CBC
HCT: 33.9 % — ABNORMAL LOW (ref 39.0–52.0)
Hemoglobin: 10.9 g/dL — ABNORMAL LOW (ref 13.0–17.0)
MCH: 29 pg (ref 26.0–34.0)
MCHC: 32.2 g/dL (ref 30.0–36.0)
MCV: 90.2 fL (ref 80.0–100.0)
Platelets: 321 10*3/uL (ref 150–400)
RBC: 3.76 MIL/uL — ABNORMAL LOW (ref 4.22–5.81)
RDW: 13.6 % (ref 11.5–15.5)
WBC: 13 10*3/uL — ABNORMAL HIGH (ref 4.0–10.5)
nRBC: 0 % (ref 0.0–0.2)

## 2019-12-04 LAB — GLUCOSE, CAPILLARY
Glucose-Capillary: 153 mg/dL — ABNORMAL HIGH (ref 70–99)
Glucose-Capillary: 155 mg/dL — ABNORMAL HIGH (ref 70–99)
Glucose-Capillary: 260 mg/dL — ABNORMAL HIGH (ref 70–99)
Glucose-Capillary: 242 mg/dL — ABNORMAL HIGH (ref 70–99)
Glucose-Capillary: 247 mg/dL — ABNORMAL HIGH (ref 70–99)
Glucose-Capillary: 247 mg/dL — ABNORMAL HIGH (ref 70–99)

## 2019-12-04 LAB — HEPARIN LEVEL (UNFRACTIONATED)
Heparin Unfractionated: 0.66 IU/mL (ref 0.30–0.70)
Heparin Unfractionated: 0.69 IU/mL (ref 0.30–0.70)

## 2019-12-04 LAB — BASIC METABOLIC PANEL
Anion gap: 7 (ref 5–15)
BUN: 33 mg/dL — ABNORMAL HIGH (ref 8–23)
CO2: 22 mmol/L (ref 22–32)
Calcium: 8.2 mg/dL — ABNORMAL LOW (ref 8.9–10.3)
Chloride: 112 mmol/L — ABNORMAL HIGH (ref 98–111)
Creatinine, Ser: 1.21 mg/dL (ref 0.61–1.24)
GFR calc Af Amer: >60 mL/min (ref >60)
GFR calc non Af Amer: 59 mL/min — ABNORMAL LOW (ref >60)
Glucose, Bld: 144 mg/dL — ABNORMAL HIGH (ref 70–99)
Potassium: 4.7 mmol/L (ref 3.5–5.1)
Sodium: 141 mmol/L (ref 135–145)

## 2019-12-04 LAB — PHOSPHORUS: Phosphorus: 3.8 mg/dL (ref 2.5–4.6)

## 2019-12-04 LAB — MAGNESIUM: Magnesium: 1.9 mg/dL (ref 1.7–2.4)

## 2019-12-04 MED ORDER — AMIODARONE IV BOLUS ONLY 150 MG/100ML: 150.0000 mg | Freq: Once | INTRAVENOUS | Status: DC

## 2019-12-04 MED ORDER — AMIODARONE HCL IN DEXTROSE 360-4.14 MG/200ML-% IV SOLN
60.0000 mg/h | INTRAVENOUS | Status: AC
  Administered 2019-12-04 (×2): 60 mg/h via INTRAVENOUS

## 2019-12-04 MED ORDER — METOPROLOL TARTRATE 5 MG/5ML IV SOLN
INTRAVENOUS | Status: AC
  Filled 2019-12-04: qty 5

## 2019-12-04 MED ORDER — AMIODARONE HCL IN DEXTROSE 360-4.14 MG/200ML-% IV SOLN: 30.0000 mg/h | INTRAVENOUS | Status: DC

## 2019-12-04 MED ORDER — INSULIN DETEMIR 100 UNIT/ML ~~LOC~~ SOLN
14.0000 [IU] | Freq: Every day | SUBCUTANEOUS | Status: DC
  Administered 2019-12-04: 14 [IU] via SUBCUTANEOUS
  Filled 2019-12-04 (×2): qty 0.14

## 2019-12-04 MED ORDER — DEXTROSE 5 % IV SOLN: 5.0000 ug/kg/min | INTRAVENOUS | Status: DC

## 2019-12-04 MED ORDER — AMIODARONE HCL IN DEXTROSE 360-4.14 MG/200ML-% IV SOLN
30.0000 mg/h | INTRAVENOUS | Status: DC
  Administered 2019-12-04 – 2019-12-05 (×2): 30 mg/h via INTRAVENOUS
  Filled 2019-12-04 (×3): qty 200

## 2019-12-04 MED ORDER — AMIODARONE HCL IN DEXTROSE 360-4.14 MG/200ML-% IV SOLN
INTRAVENOUS | Status: AC
  Filled 2019-12-04: qty 200

## 2019-12-04 MED ORDER — DILTIAZEM HCL 25 MG/5ML IV SOLN
10.0000 mg | Freq: Once | INTRAVENOUS | Status: DC
  Filled 2019-12-04: qty 5

## 2019-12-04 MED ORDER — METOPROLOL TARTRATE 5 MG/5ML IV SOLN
5.0000 mg | Freq: Once | INTRAVENOUS | Status: AC
  Administered 2019-12-04: 5 mg via INTRAVENOUS
  Filled 2019-12-04: qty 5

## 2019-12-04 MED ORDER — AMIODARONE LOAD VIA INFUSION
150.0000 mg | Freq: Once | INTRAVENOUS | Status: AC
  Administered 2019-12-04: 150 mg via INTRAVENOUS
  Filled 2019-12-04: qty 83.34

## 2019-12-04 MED ORDER — AMIODARONE HCL IN DEXTROSE 360-4.14 MG/200ML-% IV SOLN: 60.0000 mg/h | INTRAVENOUS | Status: DC

## 2019-12-04 NOTE — Progress Notes (Signed)
Chart reviewed Echo is reviewed  Telemetry is personally reviewed and reveals sinus rhythm with episodes of sinus bradycardia.  + PVCs.  No afib overnight.   + troponin likely multifactorial and secondary to PTE, covid +, pneumonia, and acute medical illness.  Continue heparin drip until conversion to [facility] for PTE  No further CV assessment planned at this time.  Thompson Grayer MD, [facility] 12/04/2019 10:10 AM

## 2019-12-04 NOTE — Progress Notes (Addendum)
[place] for heparin Indication: pulmonary embolus, new afib, NSTEMI  Heparin dosing weight - 72.9 kg  Assessment: 74 year old male presenting with SOB found to be COVID+ with acute pulmonary embolism. Also noted new-onset afib and NSTEMI. Pharmacy consulted for heparin dosing. Patient not on any anticoagulation prior to admission.   Heparin level therapeutic this morning at 0.66. CBC stable. No active bleed issues reported.  Goal of Therapy:  Heparin level 0.3-0.7 units/ml Monitor platelets by anticoagulation protocol: Yes   Plan:  Continue heparin infusion at 1300 units/hour Check 6hr heparin level to confirm Monitor daily heparin level and CBC, s/sx bleeding F/u long-term plan for anticoagulation as appropriate   Arturo Morton, PharmD, BCPS Please check AMION for all [place] contact numbers Clinical Pharmacist 12/04/2019 11:13 AM    ADDENDUM: Confirmatory heparin level remains therapeutic but approaching top of the range at 0.69. No bleeding or issues with infusion per discussion with RN.  Plan: Reduce heparin IV slightly to 1250 units/hr to ensure stays in range Monitor daily heparin level and CBC, s/sx bleeding   Arturo Morton, PharmD, BCPS Please check AMION for all [place] contact numbers Clinical Pharmacist 12/04/2019 3:01 PM

## 2019-12-04 NOTE — Progress Notes (Signed)
NAME:  Patrick Miranda, MRN:  [id number], DOB:  1946/05/09, LOS: 2 ADMISSION DATE:  12/02/2019, CONSULTATION DATE:  12/04/19 REFERRING MD:  EDP, CHIEF COMPLAINT:  PNA   Brief History   73 year old male presented with several days of shortness of breath and developed chest pain on 5/14.  He was found to have Covid pneumonia, new onset atrial fibrillation with RVR, and bilateral pulmonary emboli.  PCCM consulted for admission.  History of present illness   Patrick Miranda is a 74 year old male with past medical history of CVA, type 2 diabetes, hyperlipidemia, hypertension, OSA compliant with CPAP who was exposed to Covid-19 around 5/1 then developed shortness of breath 3 days ago.  This progressively worsened and he developed central chest pain today so presented to the emergency department.  He has not received any Covid-19 vaccination.    In the ED, he was initially in atrial fibrillation with RVR with heart rate in the 140s and borderline low blood pressure.  Oxygen saturations 88%.  He was placed on nasal cannula oxygen and given IV metoprolol.  Chest x-ray showed patchy airspace opacities, CTA chest showed PE in the segmental right lower lobe PA and subsegmental left lower lobe PE without evidence of right heart strain.  Covid-19 results were positive along with elevated troponin 4,545, BNP 439, and lactic acid 4.5, WBC 14.3, glucose also significantly elevated >500 with bicarb of 15 and anion gap 19.  EKG without evidence of acute ischemia. Pt was started on a heparin gtt and insulin gtt and PCCM consulted for admission.  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  Significant Hospital Events   5/14 Admit to PCCM>>  Consults:  cardiology  Procedures:  n/a  Significant Diagnostic Tests:   5/14 CXR>>Coarse interstitial and patchy airspace opacities in the bilateral lung bases 5/14 CTA chest>>Partially occlusive thrombus within the bifurcation of the segmental anterior posterior right lower lobe pulmonary arteries as well as within the subsegmental anterior left lower lobe pulmonary arterial.  Micro Data:  12/04/19 Sars-Cov-2>>positive  12/04/19 Blood cultures>> 12/04/19 UC>>  Antimicrobials:   Ceftriaxone 5/14- Azithromycin 5/14-  Interim history/subjective:  Pt awake and alert and stable on [place]; denies chest pain or SOB; reports "a little dizzy when I tried to move around".   Objective   Blood pressure (!) 111/59, pulse (!) 115, temperature 97.7 F (36.5 C), temperature source Oral, resp. rate (!) 26, height 5\' 10"  (1.778 m), weight 72.9 kg, SpO2 (!) 89 %.        Intake/Output Summary (Last 24 hours) at 12/04/2019 1141 Last data filed at 12/04/2019 1100 Gross per 24 hour  Intake 1103.03 ml  Output 840 ml  Net 263.03 ml   Filed Weights   12/02/19 2000 12/03/19 0045  Weight: 79.4 kg 72.9 kg    General: Elderly, well-nourished male alert and conversational in no distress HEENT: MM pink/moist Neuro: Awake alert oriented x3, following commands and conversational CV: s1s2 regular rate and rhythm, no m/r/g PULM: Mild rhonchi bilateral bases, oxygen saturations greater than 92% on 10 L nasal cannula without tachypnea or retractions GI: soft, bsx4 active  Extremities: warm/dry, no edema  Skin: no rashes or lesions  Resolved Hospital Problem list   Afib with RVR  Assessment & Plan:    Acute hypoxic respiratory failure secondary to Covid-19 pneumonia and bilateral PE -Pt  feeling much improved since arrival, saturating well on 10 L nasal cannula -No right heart strain on CTA P: -Continue heparin drip; given Plavix this a.m., will d/c heparin likely this afternoon -Start remdesivir and dexamethasone, no indication for Actemra -Supplemental oxygen as needed to  maintain sats> 92%, patient would like to be intubated if needed -Follow blood cultures and cover for possible concurrent bacterial infection with azithromycin and ceftriaxone -Echocardiogram   Chest Pain with elevated troponin -secondary to PE vs NSTEMI -chest pain resolved without nitro or morphine P: -Continue heparin drip, significant troponin jump from 300->4500, trending down -Give aspirin 325 mg, continue home Plavix tomorrow -trending troponin (down from 4543-->3836 last)   New onset atrial fibrillation with RVR -Converted to sinus rhythm in the ED; new episode this a.m. given metoprolol and loading amiodarone -Likely secondary to acute issues as above P: -appreciate cardiology input -rate control, anticoagulation; plavix and ASA, loading amio and more immediate rate control with metoprolol   Type 2 diabetes with hyperglycemia -Insulin drip initiated in the ED -pH 7.34 P: -transition to [place], PO glycemic control -A1c -Diabetic diet   Best practice:  Diet: diabetic  Pain/Anxiety/Delirium protocol (if indicated): N/A VAP protocol (if indicated): N/A DVT prophylaxis: Heparin drip, start Plavix, ASA GI prophylaxis: PPI Glucose control: transition to PO/[place] as now closer to normal limits Mobility: Bedrest Code Status: Full code Family Communication: We will try to reach wife Disposition: ICU  Labs   CBC: Recent Labs  Lab 12/02/19 1548 12/02/19 1604 12/02/19 1622 12/03/19 0250 12/04/19 0310  WBC 14.3*  --   --  11.0* 13.0*  NEUTROABS 12.2*  --   --   --   --   HGB 13.0 14.3 12.6* 11.4* 10.9*  HCT 44.0 42.0 37.0* 36.4* 33.9*  MCV 95.4  --   --  89.2 90.2  PLT 438*  --   --  309 [id number]    Basic Metabolic Panel: Recent Labs  Lab 12/02/19 1548 12/02/19 1604 12/02/19 1622 12/02/19 2212 12/03/19 0250 12/04/19 0310  NA 141 140 139  --  147* 141  K 5.4* 4.9 4.8  --  4.8 4.7  CL 108 111  --   --  114* 112*  CO2 14*  --   --   --  21* 22  GLUCOSE 556* 547*   --   --  150* 144*  BUN 27* 28*  --   --  29* 33*  CREATININE 1.72* 1.30*  --   --  1.03 1.21  CALCIUM 8.9  --   --   --  8.6* 8.2*  MG 2.1  --   --  2.4 2.3 1.9  PHOS  --   --   --   --  1.7* 3.8   GFR: Estimated Creatinine Clearance: 55.2 mL/min (by C-G formula based on SCr of 1.21 mg/dL). Recent Labs  Lab 12/02/19 1548 12/02/19 1558 12/02/19 1715 12/02/19 1816 12/03/19 0250 12/04/19 0310  PROCALCITON  --  0.13  --   --   --   --   WBC 14.3*  --   --   --  11.0* 13.0*  LATICACIDVEN  --   --  4.5* 3.2*  --   --     Liver Function Tests: Recent Labs  Lab 12/02/19 1548  AST 39  ALT 13  ALKPHOS 63  BILITOT 1.6*  PROT 6.7  ALBUMIN 2.2*   No results for input(s): LIPASE, AMYLASE in the last 168 hours. No results  for input(s): AMMONIA in the last 168 hours.  ABG    Component Value Date/Time   PHART 7.343 (L) 12/02/2019 1622   PCO2ART 25.4 (L) 12/02/2019 1622   PO2ART 60 (L) 12/02/2019 1622   HCO3 13.8 (L) 12/02/2019 1622   TCO2 15 (L) 12/02/2019 1622   ACIDBASEDEF 10.0 (H) 12/02/2019 1622   O2SAT 90.0 12/02/2019 1622     Coagulation Profile: No results for input(s): INR, PROTIME in the last 168 hours.  Cardiac Enzymes: No results for input(s): CKTOTAL, CKMB, CKMBINDEX, TROPONINI in the last 168 hours.  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   Surgical History    Past Surgical History:  Procedure Laterality Date  . COLONOSCOPY W/ POLYPECTOMY    . TOTAL KNEE ARTHROPLASTY Left 04/22/2013   Procedure: TOTAL KNEE ARTHROPLASTY- left;  Surgeon: Augustin Schooling, MD;  Location: [place];  Service: Orthopedics;  Laterality: Left;  with femoral block  . TOTAL KNEE ARTHROPLASTY Right 01/06/2014   Procedure: RIGHT TOTAL KNEE ARTHROPLASTY;  Surgeon: Augustin Schooling, MD;  Location: [place];  Service: Orthopedics;  Laterality: Right;     Social History   reports that he has never smoked. He has never used smokeless tobacco. He reports that he does not drink alcohol or use drugs.   Family History   His family history is not on file.   Allergies Allergies  Allergen Reactions  . Lipitor [Atorvastatin] Other (See Comments)    Muscle pain     Home Medications  Prior to Admission medications   Medication Sig Start Date End Date Taking? Authorizing Provider  BD PEN NEEDLE NANO U/F 32G X 4 MM MISC 1 each by Other route daily.  03/19/18  Yes [provider]  carvedilol (COREG) 12.5 MG tablet Take 12.5 mg by mouth 2 (two) times daily with a meal.   Yes [provider]  clopidogrel (PLAVIX) 75 MG tablet Take 75 mg by mouth daily with breakfast.   Yes [provider]  Empagliflozin-Metformin HCl (SYNJARDY) 11-998 MG TABS Take 1 tablet by mouth 2 (two) times daily.   Yes [provider]  fenofibrate 160 MG tablet Take 160 mg by mouth daily.   Yes [provider]  HUMALOG KWIKPEN 100 UNIT/ML KwikPen Inject 10 Units into the skin with breakfast, with lunch, and with evening meal.  04/27/18  Yes [provider]  losartan-hydrochlorothiazide (HYZAAR) 50-12.5 MG per tablet Take 1 tablet by mouth daily.   Yes [provider]  methylphenidate (RITALIN) 5 MG tablet Take 1 tablet (5 mg total) by mouth 2 (two) times daily. If needed for sleepiness Patient taking  differently: Take 5 mg by mouth once a week. On sundays  07/13/19  Yes Young, Kasandra Knudsen, MD  [facility] VERIO test strip 1 each by Other route in the morning, at noon, and at bedtime.  03/20/18  Yes [provider]  TOUJEO SOLOSTAR 300 UNIT/ML SOPN Inject 30 Units into the skin daily.  06/25/18  Yes [provider]  ipratropium (ATROVENT) 0.03 % nasal spray 1-2 sprays each nostril every 8-12 hours if needed Patient not taking: Reported on 12/02/2019 07/10/17   Deneise Lever, MD     I have independently seen and examined the patient, reviewed data, and developed an assessment and plan. A total of 69minutes were spent in critical care assessment and medical decision making. This critical care time does not reflect procedure time, or teaching time or supervisory time of PA/NP/Med student/Med Resident, etc but could involve care discussion time.    Bonna Gains, MD, PhD 12/04/19 11:46 AM

## 2019-12-05 LAB — CBC
HCT: 34.7 % — ABNORMAL LOW (ref 39.0–52.0)
Hemoglobin: 10.9 g/dL — ABNORMAL LOW (ref 13.0–17.0)
MCH: 27.9 pg (ref 26.0–34.0)
MCHC: 31.4 g/dL (ref 30.0–36.0)
MCV: 88.7 fL (ref 80.0–100.0)
Platelets: 336 10*3/uL (ref 150–400)
RBC: 3.91 MIL/uL — ABNORMAL LOW (ref 4.22–5.81)
RDW: 13.6 % (ref 11.5–15.5)
WBC: 9.5 10*3/uL (ref 4.0–10.5)
nRBC: 0 % (ref 0.0–0.2)

## 2019-12-05 LAB — MAGNESIUM: Magnesium: 1.6 mg/dL — ABNORMAL LOW (ref 1.7–2.4)

## 2019-12-05 LAB — BASIC METABOLIC PANEL
Anion gap: 7 (ref 5–15)
BUN: 30 mg/dL — ABNORMAL HIGH (ref 8–23)
CO2: 20 mmol/L — ABNORMAL LOW (ref 22–32)
Calcium: 8 mg/dL — ABNORMAL LOW (ref 8.9–10.3)
Chloride: 109 mmol/L (ref 98–111)
Creatinine, Ser: 1.15 mg/dL (ref 0.61–1.24)
GFR calc Af Amer: >60 mL/min (ref >60)
GFR calc non Af Amer: >60 mL/min (ref >60)
Glucose, Bld: 274 mg/dL — ABNORMAL HIGH (ref 70–99)
Potassium: 4.8 mmol/L (ref 3.5–5.1)
Sodium: 136 mmol/L (ref 135–145)

## 2019-12-05 LAB — GLUCOSE, CAPILLARY
Glucose-Capillary: 236 mg/dL — ABNORMAL HIGH (ref 70–99)
Glucose-Capillary: 256 mg/dL — ABNORMAL HIGH (ref 70–99)
Glucose-Capillary: 194 mg/dL — ABNORMAL HIGH (ref 70–99)
Glucose-Capillary: 304 mg/dL — ABNORMAL HIGH (ref 70–99)
Glucose-Capillary: 253 mg/dL — ABNORMAL HIGH (ref 70–99)
Glucose-Capillary: 207 mg/dL — ABNORMAL HIGH (ref 70–99)

## 2019-12-05 LAB — PHOSPHORUS: Phosphorus: 3.9 mg/dL (ref 2.5–4.6)

## 2019-12-05 LAB — HEPARIN LEVEL (UNFRACTIONATED): Heparin Unfractionated: 0.21 IU/mL — ABNORMAL LOW (ref 0.30–0.70)

## 2019-12-05 MED ORDER — MAGNESIUM SULFATE 4 GM/100ML IV SOLN
4.0000 g | Freq: Once | INTRAVENOUS | Status: AC
  Administered 2019-12-05: 4 g via INTRAVENOUS
  Filled 2019-12-05: qty 100

## 2019-12-05 MED ORDER — APIXABAN 5 MG PO TABS
10.0000 mg | ORAL_TABLET | Freq: Two times a day (BID) | ORAL | Status: DC
  Administered 2019-12-05 – 2019-12-11 (×13): 10 mg via ORAL
  Filled 2019-12-05 (×13): qty 2

## 2019-12-05 MED ORDER — DOXYCYCLINE HYCLATE 100 MG PO TABS
100.0000 mg | ORAL_TABLET | Freq: Two times a day (BID) | ORAL | Status: AC
  Administered 2019-12-05 – 2019-12-09 (×10): 100 mg via ORAL
  Filled 2019-12-05 (×10): qty 1

## 2019-12-05 MED ORDER — SALINE SPRAY 0.65 % NA SOLN
1.0000 | NASAL | Status: DC | PRN
  Filled 2019-12-05: qty 44

## 2019-12-05 MED ORDER — INSULIN ASPART 100 UNIT/ML ~~LOC~~ SOLN
0.0000 [IU] | Freq: Three times a day (TID) | SUBCUTANEOUS | Status: DC
  Administered 2019-12-05: 15 [IU] via SUBCUTANEOUS
  Administered 2019-12-05: 11 [IU] via SUBCUTANEOUS
  Administered 2019-12-06 (×2): 7 [IU] via SUBCUTANEOUS
  Administered 2019-12-06: 11 [IU] via SUBCUTANEOUS
  Administered 2019-12-07 – 2019-12-08 (×4): 4 [IU] via SUBCUTANEOUS
  Administered 2019-12-08: 3 [IU] via SUBCUTANEOUS
  Administered 2019-12-09 (×2): 4 [IU] via SUBCUTANEOUS
  Administered 2019-12-09: 3 [IU] via SUBCUTANEOUS
  Administered 2019-12-10 – 2019-12-11 (×4): 4 [IU] via SUBCUTANEOUS
  Administered 2019-12-11: 3 [IU] via SUBCUTANEOUS

## 2019-12-05 MED ORDER — INSULIN ASPART 100 UNIT/ML ~~LOC~~ SOLN: 6.0000 [IU] | Freq: Three times a day (TID) | SUBCUTANEOUS | Status: DC

## 2019-12-05 MED ORDER — APIXABAN 5 MG PO TABS: 5.0000 mg | ORAL_TABLET | Freq: Two times a day (BID) | ORAL | Status: DC

## 2019-12-05 MED ORDER — INSULIN ASPART 100 UNIT/ML ~~LOC~~ SOLN
6.0000 [IU] | Freq: Three times a day (TID) | SUBCUTANEOUS | Status: DC
  Administered 2019-12-05 – 2019-12-11 (×19): 6 [IU] via SUBCUTANEOUS

## 2019-12-05 MED ORDER — INSULIN DETEMIR 100 UNIT/ML ~~LOC~~ SOLN
20.0000 [IU] | Freq: Every day | SUBCUTANEOUS | Status: DC
  Administered 2019-12-05 – 2019-12-10 (×6): 20 [IU] via SUBCUTANEOUS
  Filled 2019-12-05 (×8): qty 0.2

## 2019-12-05 MED ORDER — APIXABAN (ELIQUIS) EDUCATION KIT FOR DVT/PE PATIENTS
PACK | Freq: Once | Status: AC
  Filled 2019-12-05: qty 1

## 2019-12-05 NOTE — Plan of Care (Signed)
  Problem: Education: Goal: Knowledge of risk factors and measures for prevention of condition will improve Outcome: Progressing   Problem: Coping: Goal: Psychosocial and spiritual needs will be supported Outcome: Progressing   Problem: Respiratory: Goal: Will maintain a patent airway Outcome: Progressing Goal: Complications related to the disease process, condition or treatment will be avoided or minimized Outcome: Progressing   Problem: Respiratory: Goal: Complications related to the disease process, condition or treatment will be avoided or minimized Outcome: Progressing   

## 2019-12-05 NOTE — Progress Notes (Signed)
Replaced mag per protocol

## 2019-12-05 NOTE — Progress Notes (Signed)
[place] for heparin Indication: pulmonary embolus, new afib, NSTEMI  Heparin dosing weight - 72.9 kg  Assessment: 74 year old male presenting with SOB found to be COVID+ with acute pulmonary embolism. Also noted new-onset afib and NSTEMI. Pharmacy consulted for heparin dosing. Patient not on any anticoagulation prior to admission.   Heparin level 0.21 units/ml.  No issues noted with infusion  Goal of Therapy:  Heparin level 0.3-0.7 units/ml Monitor platelets by anticoagulation protocol: Yes   Plan:  Increase heparin infusion to 1350 units/hour Check 6hr heparin level Monitor daily heparin level and CBC, s/sx bleeding F/u long-term plan for anticoagulation as appropriate   Thanks for allowing pharmacy to be a part of this patient's care.  Excell Seltzer, PharmD Clinical Pharmacist

## 2019-12-05 NOTE — Evaluation (Signed)
Occupational Therapy Evaluation Patient Details Name: Patrick Miranda MRN: [id number] DOB: 09/20/1945 Today's Date: 12/05/2019    History of Present Illness Pt is a 74 year old mans admitted with SOB, + COVID 19. PMH: CVA, DM2, HTN, OSA on CPAP, arthritis, depression.    Clinical Impression   Pt was independent prior to admission. Presents with decreased activity tolerance and impaired standing balance. He requires up to min assist for ADL and mobility. Pt with poor activity tolerance with Sp02 dropping to 84% with minimal exertion on 10L 02. Will follow acutely. Anticipate pt will be able to return home with his wife upon discharge.    Follow Up Recommendations  Home health OT;Supervision/Assistance - 24 hour    Equipment Recommendations  3 in 1 bedside commode    Recommendations for Other Services       Precautions / Restrictions Precautions Precautions: Fall Precaution Comments: on HFNC Restrictions Weight Bearing Restrictions: No      Mobility Bed Mobility Overal bed mobility: Needs Assistance Bed Mobility: Supine to Sit     Supine to sit: Supervision     General bed mobility comments: supervision for safety and lines  Transfers Overall transfer level: Needs assistance Equipment used: 1 person hand held assist Transfers: Sit to/from Stand Sit to Stand: Min guard              Balance Overall balance assessment: Needs assistance   Sitting balance-Leahy Scale: Good       Standing balance-Leahy Scale: Fair Standing balance comment: statically                           ADL either performed or assessed with clinical judgement   ADL Overall ADL's : Needs assistance/impaired Eating/Feeding: Sitting;Independent   Grooming: Set up;Sitting   Upper Body Bathing: Set up;Sitting   Lower Body Bathing: Minimal assistance;Sit to/from stand   Upper Body Dressing : Set up;Sitting   Lower Body Dressing: Minimal assistance;Sit to/from stand    Toilet Transfer: Minimal assistance   Toileting- Clothing Manipulation and Hygiene: Minimal assistance;Sit to/from stand       Functional mobility during ADLs: Minimal assistance       Vision Baseline Vision/History: Wears glasses Wears Glasses: Reading only Patient Visual Report: No change from baseline       Perception     Praxis      Pertinent Vitals/Pain Pain Assessment: No/denies pain     Hand Dominance Right   Extremity/Trunk Assessment Upper Extremity Assessment Upper Extremity Assessment: Defer to OT evaluation   Lower Extremity Assessment Lower Extremity Assessment: Defer to PT evaluation   Cervical / Trunk Assessment Cervical / Trunk Assessment: Normal   Communication Communication Communication: No difficulties   Cognition Arousal/Alertness: Awake/alert Behavior During Therapy: WFL for tasks assessed/performed Overall Cognitive Status: Within Functional Limits for tasks assessed                                     General Comments       Exercises     Shoulder Instructions      Home Living Family/patient expects to be discharged to:: Private residence Living Arrangements: Spouse/significant other Available Help at Discharge: Family;Available 24 hours/day Type of Home: House Home Access: Level entry     Home Layout: One level;Laundry or work area in basement     [employer]: Hospital doctor  Toilet: Handicapped height     Home Equipment: None          Prior Functioning/Environment Level of Independence: Independent        Comments: drives        OT Problem List: Decreased activity tolerance;Impaired balance (sitting and/or standing);Cardiopulmonary status limiting activity      OT Treatment/Interventions: Self-care/ADL training;Energy conservation;Balance training;Patient/family education    OT Goals(Current goals can be found in the care plan section) Acute Rehab OT Goals Patient  Stated Goal: return home OT Goal Formulation: With patient Time For Goal Achievement: 12/19/19 Potential to Achieve Goals: Good ADL Goals Pt Will Perform Grooming: with modified independence;standing Pt Will Perform Lower Body Bathing: with modified independence;sit to/from stand Pt Will Perform Lower Body Dressing: with modified independence;sit to/from stand Pt Will Transfer to Toilet: with modified independence;ambulating;bedside commode(over toilet as needed) Pt Will Perform Toileting - Clothing Manipulation and hygiene: with modified independence;sit to/from stand Additional ADL Goal #1: Pt will utilize energy conservation and breathing techniques with minimal verbal cues.  OT Frequency: Min 2X/week   Barriers to D/C:            Co-evaluation PT/OT/SLP Co-Evaluation/Treatment: Yes Reason for Co-Treatment: Complexity of the patient's impairments (multi-system involvement);For patient/therapist safety   OT goals addressed during session: ADL's and self-care      AM-PAC OT "6 Clicks" Daily Activity     Outcome Measure Help from another person eating meals?: None Help from another person taking care of personal grooming?: A Little Help from another person toileting, which includes using toliet, bedpan, or urinal?: A Little Help from another person bathing (including washing, rinsing, drying)?: A Little Help from another person to put on and taking off regular upper body clothing?: A Little Help from another person to put on and taking off regular lower body clothing?: A Little 6 Click Score: 19   End of Session Equipment Utilized During Treatment: Gait belt;Oxygen(10L) Nurse Communication: Mobility status  Activity Tolerance: Treatment limited secondary to medical complications (Comment)(desats with minimal exertion) Patient left: in chair;with call bell/phone within reach  OT Visit Diagnosis: Unsteadiness on feet (R26.81);Other abnormalities of gait and mobility (R26.89)                 Time: [id number] OT Time Calculation (min): 49 min Charges:  OT General Charges $OT Visit: 1 Visit OT Evaluation $OT Eval Moderate Complexity: 1 Mod  Nestor Lewandowsky, OTR/L Acute Rehabilitation Services Pager: [phone] Office: [phone] Malka So 12/05/2019, 11:59 AM

## 2019-12-05 NOTE — Progress Notes (Signed)
NAME:  Patrick Miranda, MRN:  [id number], DOB:  05-23-1946, LOS: 3 ADMISSION DATE:  12/02/2019, CONSULTATION DATE:  12/05/19 REFERRING MD:  EDP, CHIEF COMPLAINT:  PNA   Brief History   74 year old male presented with several days of shortness of breath and developed chest pain on 5/14.  He was found to have Covid pneumonia, new onset atrial fibrillation with RVR, and bilateral pulmonary emboli.  PCCM consulted for admission.  History of present illness   Patrick Miranda is a 74 year old male with past medical history of CVA, type 2 diabetes, hyperlipidemia, hypertension, OSA compliant with CPAP who was exposed to Covid-19 around 5/1 then developed shortness of breath 3 days ago.  This progressively worsened and he developed central chest pain today so presented to the emergency department.  He has not received any Covid-19 vaccination.    In the ED, he was initially in atrial fibrillation with RVR with heart rate in the 140s and borderline low blood pressure.  Oxygen saturations 88%.  He was placed on nasal cannula oxygen and given IV metoprolol.  Chest x-ray showed patchy airspace opacities, CTA chest showed PE in the segmental right lower lobe PA and subsegmental left lower lobe PE without evidence of right heart strain.  Covid-19 results were positive along with elevated troponin 4,545, BNP 439, and lactic acid 4.5, WBC 14.3, glucose also significantly elevated >500 with bicarb of 15 and anion gap 19.  EKG without evidence of acute ischemia. Pt was started on a heparin gtt and insulin gtt and PCCM consulted for admission.  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  Significant Hospital Events   5/14 Admit to PCCM>>  Consults:  cardiology  Procedures:  n/a  Significant Diagnostic Tests:   5/14 CXR>>Coarse interstitial and patchy airspace opacities in the bilateral lung bases 5/14 CTA chest>>Partially occlusive thrombus within the bifurcation of the segmental anterior posterior right lower lobe pulmonary arteries as well as within the subsegmental anterior left lower lobe pulmonary arterial. Echocardiogram 5/15 >> regional wall motion abnormality, mild basal inferior/posterior lateral wall hypokinesis, LVEF 50-55%, normal RV size and function  Micro Data:  Sars-Cov-2 5/14>>positive  Blood cultures 5/15 >> UC 5/14>> insignificant growth  Antimicrobials:  Ceftriaxone 5/14 > 5/17 Azithromycin 5/14-5/16 Doxycycline 5/16 >   Interim history/subjective:   Currently on 8 L/min high flow nasal cannula with adequate saturations Remains on amiodarone infusion Anion gap is closed, insulin infusion weaned off  Objective   Blood pressure 126/76, pulse (!) 51, temperature 97.6 F (36.4 C), temperature source Oral, resp. rate (!) 23, height 5\' 10"  (1.778 m), weight 72.9 kg, SpO2 96 %.        Intake/Output Summary (Last 24 hours) at 12/05/2019 [id number] Last data filed at 12/05/2019 0500 Gross per 24 hour  Intake 1723.85 ml  Output 845 ml  Net 878.85 ml   Filed Weights   12/02/19 2000 12/03/19 0045  Weight: 79.4 kg 72.9 kg    General: Elderly ill-appearing man, laying comfortably in bed HEENT: Oropharynx clear Neuro: Awake, alert, appropriate, follows commands, answers questions CV: Distant, regular, no murmur PULM: Scattered bilateral inspiratory crackles, no wheezing GI: Nondistended, positive bowel sounds Extremities: No edema Skin: Rash  Resolved Hospital Problem list   Afib with RVR  Assessment & Plan:    Acute hypoxic respiratory failure secondary to Covid-19 pneumonia and  bilateral PE -Pt feeling much improved since arrival, saturating well on 10 L nasal cannula -No right heart strain on CTA P: Continue heparin infusion, plan to change to DOAC 5/17 >>  eliquis Remains on remdesivir plan 5 days, dexamethasone plan 10 days Wean FiO2 as able Mobilize, pulmonary hygiene Cover empirically for possible CAP, ceftriaxone and doxycycline.  Likely transition to doxycycline alone to complete p.o. course Follow culture data   Chest Pain with elevated troponin -secondary to PE vs NSTEMI Echocardiogram with regional wall motion abnormality 5/15 P: Heparin infusion Follow troponin trend Cardiology input appreciated Aspirin, Plavix as ordered   New onset atrial fibrillation with RVR -Converted to sinus rhythm in the ED; new episode this a.m. given metoprolol and loading amiodarone -Likely secondary to acute issues as above P: Appreciate cardiology input Anticoagulation as ordered, metoprolol available as needed.  Transition off amiodarone as suspect that atrial fibrillation secondary to acute events   Type 2 diabetes with hyperglycemia -Insulin drip initiated in the ED Hemoglobin A1c 9.4 P: Increase levamir  Add meal coverage 5/17 Transitioned off, d5 0.45ns off Diabetic diet   Best practice:  Diet: diabetic  Pain/Anxiety/Delirium protocol (if indicated): N/A VAP protocol (if indicated): N/A DVT prophylaxis: Heparin drip, start Plavix, ASA GI prophylaxis: PPI Glucose control: transition to PO/[place] Mobility: Bedrest Code Status: Full code Family Communication: updated wife by phone 5/17 Disposition: ICU  Labs   CBC: Recent Labs  Lab 12/02/19 1548 12/02/19 1548 12/02/19 1604 12/02/19 1622 12/03/19 0250 12/04/19 0310 12/05/19 0332  WBC 14.3*  --   --   --  11.0* 13.0* 9.5  NEUTROABS 12.2*  --   --   --   --   --   --   HGB 13.0   < > 14.3 12.6* 11.4* 10.9* 10.9*  HCT 44.0   < > 42.0 37.0* 36.4* 33.9* 34.7*  MCV 95.4  --   --   --  89.2 90.2 88.7  PLT 438*  --   --   --  309 321 336   < > = values in this interval not displayed.    Basic Metabolic Panel: Recent Labs  Lab 12/02/19 1548 12/02/19 1548 12/02/19 1604  12/02/19 1622 12/02/19 2212 12/03/19 0250 12/04/19 0310 12/05/19 0332  NA 141   < > 140 139  --  147* 141 136  K 5.4*   < > 4.9 4.8  --  4.8 4.7 4.8  CL 108  --  111  --   --  114* 112* 109  CO2 14*  --   --   --   --  21* 22 20*  GLUCOSE 556*  --  547*  --   --  150* 144* 274*  BUN 27*  --  28*  --   --  29* 33* 30*  CREATININE 1.72*  --  1.30*  --   --  1.03 1.21 1.15  CALCIUM 8.9  --   --   --   --  8.6* 8.2* 8.0*  MG 2.1  --   --   --  2.4 2.3 1.9 1.6*  PHOS  --   --   --   --   --  1.7* 3.8 3.9   < > = values in this interval not displayed.   GFR: Estimated Creatinine Clearance: 58.1 mL/min (by C-G formula based on SCr of 1.15 mg/dL). Recent Labs  Lab 12/02/19 1548 12/02/19 1558 12/02/19 1715 12/02/19 1816 12/03/19 0250 12/04/19 0310 12/05/19  [id number]  PROCALCITON  --  0.13  --   --   --   --   --   WBC 14.3*  --   --   --  11.0* 13.0* 9.5  LATICACIDVEN  --   --  4.5* 3.2*  --   --   --     Liver Function Tests: Recent Labs  Lab 12/02/19 1548  AST 39  ALT 13  ALKPHOS 63  BILITOT 1.6*  PROT 6.7  ALBUMIN 2.2*   No results for input(s): LIPASE, AMYLASE in the last 168 hours. No results for input(s): AMMONIA in the last 168 hours.  ABG    Component Value Date/Time   PHART 7.343 (L) 12/02/2019 1622   PCO2ART 25.4 (L) 12/02/2019 1622   PO2ART 60 (L) 12/02/2019 1622   HCO3 13.8 (L) 12/02/2019 1622   TCO2 15 (L) 12/02/2019 1622   ACIDBASEDEF 10.0 (H) 12/02/2019 1622   O2SAT 90.0 12/02/2019 1622     Coagulation Profile: No results for input(s): INR, PROTIME in the last 168 hours.  Cardiac Enzymes: No results for input(s): CKTOTAL, CKMB, CKMBINDEX, TROPONINI in the last 168 hours.  HbA1C: Hgb A1c MFr Bld  Date/Time Value Ref Range Status  12/03/2019 02:50 AM 9.4 (H) 4.8 - 5.6 % Final    Comment:    (NOTE) Pre diabetes:          5.7%-6.4% Diabetes:              >6.4% Glycemic control for   <7.0% adults with diabetes     CBG: Recent Labs  Lab  12/04/19 1540 12/04/19 1926 12/04/19 2305 12/05/19 0328 12/05/19 0345  GLUCAP 242* 247* 247* 236* 256*    Critical care time 32 minutes   Baltazar Apo, MD, PhD 12/05/2019, 10:51 AM [facility] [phone] or if no answer [phone]

## 2019-12-05 NOTE — Evaluation (Signed)
Physical Therapy Evaluation Patient Details Name: Patrick Miranda MRN: [id number] DOB: Sep 28, 1945 Today's Date: 12/05/2019   History of Present Illness  Pt is a 74 year old man admitted with SOB, + COVID 19, B PEs. PMH: CVA, DM2, HTN, OSA on CPAP, arthritis, depression.  Clinical Impression  Patient received in bed, agreeable to PT session. Reports he is breathing and feeling better than Friday when he came in. He is on HFNC. He required min assist for bed mobility, transfers and ambulation of short distance ( 58feet ) in room. He is limited by O2 needs at this time. He will continue to benefit from skilled PT while here to improve ambulation distance, activity tolerance and general strengthening.     Follow Up Recommendations Home health PT    Equipment Recommendations  Other (comment)(TBD)    Recommendations for Other Services       Precautions / Restrictions Precautions Precautions: Fall Precaution Comments: on HFNC Restrictions Weight Bearing Restrictions: No      Mobility  Bed Mobility Overal bed mobility: Needs Assistance Bed Mobility: Supine to Sit     Supine to sit: Supervision     General bed mobility comments: supervision for safety and lines  Transfers Overall transfer level: Needs assistance Equipment used: 1 person hand held assist Transfers: Sit to/from Stand Sit to Stand: Min guard            Ambulation/Gait Ambulation/Gait assistance: Min guard Gait Distance (Feet): 5 Feet Assistive device: 1 person hand held assist Gait Pattern/deviations: Step-to pattern;Decreased stride length Gait velocity: decreased   General Gait Details: Patient on HFNC, therefore limited in ability to walk further this session.  Stairs            Wheelchair Mobility    Modified Rankin (Stroke Patients Only)       Balance Overall balance assessment: Needs assistance Sitting-balance support: No upper extremity supported;Feet supported Sitting  balance-Leahy Scale: Good     Standing balance support: Single extremity supported;During functional activity Standing balance-Leahy Scale: Fair Standing balance comment: requires assistance for dynamic mobility                             Pertinent Vitals/Pain Pain Assessment: No/denies pain    Home Living Family/patient expects to be discharged to:: Private residence Living Arrangements: Spouse/significant other Available Help at Discharge: Family;Available 24 hours/day Type of Home: House Home Access: Level entry     Home Layout: One level;Laundry or work area in [employer]: None      Prior Function Level of Independence: Independent         Comments: drives     Hand Dominance   Dominant Hand: Right    Extremity/Trunk Assessment   Upper Extremity Assessment Upper Extremity Assessment: Defer to OT evaluation    Lower Extremity Assessment Lower Extremity Assessment: Generalized weakness    Cervical / Trunk Assessment Cervical / Trunk Assessment: Normal  Communication   Communication: No difficulties  Cognition Arousal/Alertness: Awake/alert Behavior During Therapy: WFL for tasks assessed/performed Overall Cognitive Status: Within Functional Limits for tasks assessed                                        General Comments      Exercises     Assessment/Plan    PT Assessment Patient needs continued PT services  PT Problem List Decreased strength;Decreased mobility;Decreased safety awareness;Decreased activity tolerance;Decreased balance;Cardiopulmonary status limiting activity       PT Treatment Interventions Therapeutic exercise;Gait training;Balance training;Stair training;Functional mobility training;Therapeutic activities;Patient/family education    PT Goals (Current goals can be found in the Care Plan section)  Acute Rehab PT Goals Patient Stated Goal: return home PT Goal Formulation: With  patient Time For Goal Achievement: 12/19/19 Potential to Achieve Goals: Good    Frequency Min 3X/week   Barriers to discharge        Co-evaluation PT/OT/SLP Co-Evaluation/Treatment: Yes Reason for Co-Treatment: For patient/therapist safety;Necessary to address cognition/behavior during functional activity PT goals addressed during session: Mobility/safety with mobility;Balance OT goals addressed during session: ADL's and self-care       AM-PAC PT "6 Clicks" Mobility  Outcome Measure Help needed turning from your back to your side while in a flat bed without using bedrails?: A Little Help needed moving from lying on your back to sitting on the side of a flat bed without using bedrails?: A Little Help needed moving to and from a bed to a chair (including a wheelchair)?: A Little Help needed standing up from a chair using your arms (e.g., wheelchair or bedside chair)?: A Little Help needed to walk in hospital room?: A Little Help needed climbing 3-5 steps with a railing? : A Lot 6 Click Score: 17    End of Session Equipment Utilized During Treatment: Gait belt;Oxygen Activity Tolerance: Patient limited by fatigue;Other (comment)(Patient limited by SOB, O2 saturation level) Patient left: in chair;with call bell/phone within reach Nurse Communication: Mobility status PT Visit Diagnosis: Unsteadiness on feet (R26.81);Muscle weakness (generalized) (M62.81);Difficulty in walking, not elsewhere classified (R26.2);Other (comment)(SOB)    Time: [id number] PT Time Calculation (min) (ACUTE ONLY): 45 min   Charges:   PT Evaluation $PT Eval Moderate Complexity: 1 Mod PT Treatments $Gait Training: 8-22 mins        Heidy Mccubbin, PT, GCS 12/05/19,12:42 PM

## 2019-12-06 ENCOUNTER — Inpatient Hospital Stay (HOSPITAL_COMMUNITY): Payer: Medicare Other

## 2019-12-06 DIAGNOSIS — I2699 Other pulmonary embolism without acute cor pulmonale: Secondary | ICD-10-CM

## 2019-12-06 LAB — BASIC METABOLIC PANEL
Anion gap: 8 (ref 5–15)
BUN: 27 mg/dL — ABNORMAL HIGH (ref 8–23)
CO2: 20 mmol/L — ABNORMAL LOW (ref 22–32)
Calcium: 8 mg/dL — ABNORMAL LOW (ref 8.9–10.3)
Chloride: 107 mmol/L (ref 98–111)
Creatinine, Ser: 1.1 mg/dL (ref 0.61–1.24)
GFR calc Af Amer: >60 mL/min (ref >60)
GFR calc non Af Amer: >60 mL/min (ref >60)
Glucose, Bld: 294 mg/dL — ABNORMAL HIGH (ref 70–99)
Potassium: 4.9 mmol/L (ref 3.5–5.1)
Sodium: 135 mmol/L (ref 135–145)

## 2019-12-06 LAB — GLUCOSE, CAPILLARY
Glucose-Capillary: 244 mg/dL — ABNORMAL HIGH (ref 70–99)
Glucose-Capillary: 261 mg/dL — ABNORMAL HIGH (ref 70–99)
Glucose-Capillary: 208 mg/dL — ABNORMAL HIGH (ref 70–99)
Glucose-Capillary: 196 mg/dL — ABNORMAL HIGH (ref 70–99)

## 2019-12-06 LAB — CBC
HCT: 34.8 % — ABNORMAL LOW (ref 39.0–52.0)
Hemoglobin: 11.3 g/dL — ABNORMAL LOW (ref 13.0–17.0)
MCH: 28.3 pg (ref 26.0–34.0)
MCHC: 32.5 g/dL (ref 30.0–36.0)
MCV: 87.2 fL (ref 80.0–100.0)
Platelets: 323 10*3/uL (ref 150–400)
RBC: 3.99 MIL/uL — ABNORMAL LOW (ref 4.22–5.81)
RDW: 13.5 % (ref 11.5–15.5)
WBC: 8.7 10*3/uL (ref 4.0–10.5)
nRBC: 0 % (ref 0.0–0.2)

## 2019-12-06 LAB — MAGNESIUM: Magnesium: 1.8 mg/dL (ref 1.7–2.4)

## 2019-12-06 MED ORDER — PNEUMOCOCCAL VAC POLYVALENT 25 MCG/0.5ML IJ INJ
0.5000 mL | INJECTION | INTRAMUSCULAR | Status: DC | PRN
  Filled 2019-12-06: qty 0.5

## 2019-12-06 MED ORDER — MAGNESIUM SULFATE 2 GM/50ML IV SOLN
2.0000 g | Freq: Once | INTRAVENOUS | Status: AC
  Administered 2019-12-06: 2 g via INTRAVENOUS
  Filled 2019-12-06: qty 50

## 2019-12-06 MED ORDER — METFORMIN HCL 500 MG PO TABS
1000.0000 mg | ORAL_TABLET | Freq: Two times a day (BID) | ORAL | Status: DC
  Administered 2019-12-06 – 2019-12-11 (×11): 1000 mg via ORAL
  Filled 2019-12-06 (×12): qty 2

## 2019-12-06 MED ORDER — SODIUM CHLORIDE 0.9 % IV SOLN: INTRAVENOUS | Status: DC

## 2019-12-06 MED ORDER — CANAGLIFLOZIN 100 MG PO TABS
100.0000 mg | ORAL_TABLET | Freq: Every day | ORAL | Status: DC
  Administered 2019-12-06 – 2019-12-09 (×3): 100 mg via ORAL
  Filled 2019-12-06 (×6): qty 1

## 2019-12-06 NOTE — Progress Notes (Signed)
RT NOTE:  Pt has CPAP order, however, he is COVID+. Unable to wear at this time.

## 2019-12-06 NOTE — Care Management (Signed)
Per Drema Balzarine W/[facility] :[phone]  Co-pay amount for Eliquis 5 mg. bid for a 30 day supply $120.38. (Patient is in his coverage gap)  No PA required Deductible no met [facility]: [facility].  Mail Order with [facility]: for a 90 day supply bid $381.88.

## 2019-12-06 NOTE — TOC Initial Note (Signed)
Transition of Care [facility]) - Initial/Assessment Note    Patient Details  Name: Patrick Miranda MRN: [id number] Date of Birth: 17-Jan-1946  [facility] CM/SW Contact:    Bartholomew Crews, RN Phone Number: [phone] 12/06/2019, 1:04 PM  Clinical Narrative:                  Spoke with patient spouse on the phone. PTA home with spouse and daughter. Has cpap at home - gets supplies from [place]. No oxygen at home, but may likely need home oxygen at discharge. Also has RW and 3N1 at home. Has used [facility] in the past - recommendations for [facility] and [facility] - referral accepted for PT and OT by [facility]. Patient will need HH orders for PT and OT with Face to Face at discharge. Verified PCP, Dr. Reynaldo Minium, and pharmacy, [facility], in [place]. Address in Epic is correct. TOC team following for transition needs.   Expected Discharge Plan: [place] Barriers to Discharge: Continued Medical Work up   Patient Goals and CMS Choice     Choice offered to / list presented to : Spouse  Expected Discharge Plan and Services Expected Discharge Plan: [place] In-house Referral: NA Discharge Planning Services: CM Consult Post Acute Care Choice: Home Health, Durable Medical Equipment Living arrangements for the past 2 months: Single Family Home                           HH Arranged: PT, OT   Date HH Agency Contacted: 12/06/19 Time HH Agency Contacted: 35 Representative spoke with at [place]: Butch Penny  Prior Living Arrangements/Services Living arrangements for the past 2 months: [place] with:: Self, Adult Children, Spouse Patient language and need for interpreter reviewed:: Yes        Need for Family Participation in Patient Care: Yes (Comment) Care giver support system in place?: Yes (comment) Current home services: DME(cpap; RW; 3N1) Criminal Activity/Legal Involvement Pertinent to Current Situation/Hospitalization: No  - Comment as needed  Activities of Daily Living      Permission Sought/Granted                  Emotional Assessment         Alcohol / Substance Use: Not Applicable Psych Involvement: No (comment)  Admission diagnosis:  New onset a-fib ([place]) [I48.91] NSTEMI (non-ST elevated myocardial infarction) ([place]) [I21.4] Multiple subsegmental pulmonary emboli without acute cor pulmonale ([place]) [I26.94] COVID-19 virus infection [U07.1] Pneumonia due to COVID-19 virus [U07.1, J12.82] Patient Active Problem List   Diagnosis Date Noted  . COVID-19 virus infection 12/02/2019  . Multiple subsegmental pulmonary emboli without acute cor pulmonale ([place])   . New onset a-fib ([place])   . NSTEMI (non-ST elevated myocardial infarction) ([place])   . Hypersomnia 08/30/2019  . History of adenomatous polyp of colon 05/06/2018  . Vasomotor rhinitis 07/18/2017  . Arthritis of knee, right 01/06/2014  . Brainstem stroke ([place]) 09/07/2011    Class: Acute  . Dysarthria 09/05/2011    Class: Acute  . Chronic kidney disease (CKD), stage II (mild) 09/05/2011    Class: Chronic  . Hypertension 09/05/2011    Class: Chronic  . DM (diabetes mellitus), type 2 ([place]) 09/05/2011    Class: Chronic  . Hyperlipidemia 09/05/2011    Class: Chronic  . Obstructive sleep apnea 09/05/2011    Class: Chronic  . Anemia 09/05/2011  Class: Chronic   PCP:  Burnard Bunting, MD Pharmacy:   [place], [place] [address] Phone: [phone] Fax: [phone]  [facility] [address] [place] [address] Phone: Susan Moore, [place] [address] [place] [address] Phone: [phone] Fax: [phone]  [facility] [place], [place] - [address] [place] [address] Phone: [phone] Fax:  [phone]     Social Determinants of Health (SDOH) Interventions    Readmission Risk Interventions No flowsheet data found.

## 2019-12-06 NOTE — Progress Notes (Signed)
~  1750 Transferring to 5W07.  Attempted to call report.  Room is not ready.  RN was on break.  Was asked to call back. ~1815 gave report to [place], Therapist, sports.  Stated room still dirty.  Requested call when room is  Clean.

## 2019-12-06 NOTE — Progress Notes (Signed)
Inpatient Diabetes Program Recommendations  AACE/ADA: New Consensus Statement on Inpatient Glycemic Control (2015)  Target Ranges:  Prepandial:   less than 140 mg/dL      Peak postprandial:   less than 180 mg/dL (1-2 hours)      Critically ill patients:  140 - 180 mg/dL   Lab Results  Component Value Date   GLUCAP 244 (H) 12/06/2019   HGBA1C 9.4 (H) 12/03/2019    Review of Glycemic Control Results for Patrick Miranda, Patrick Miranda (MRN [id number]) as of 12/06/2019 09:06  Ref. Range 12/05/2019 07:36 12/05/2019 12:09 12/05/2019 15:38 12/05/2019 19:16 12/06/2019 07:22  Glucose-Capillary Latest Ref Range: 70 - 99 mg/dL 194 (H) 304 (H) 253 (H) 207 (H) 244 (H)   Diabetes history: DM 2 Outpatient Diabetes medications: Toujeo 30 units Daily, Humalog 10 units tid, Synjardy 11-998 mg bid  Current orders for Inpatient glycemic control:  Levemir 20 units qhs Novolog 0-20 units Q4 hours Novolog 6 units tid meal coverage Metformin 1000 mg bid Invokana 100 mg Daily  Decadron 6 mg Q24 hours  Inpatient Diabetes Program Recommendations:    Noted Invokana and metformin added this am. Levemir decreased to 20 units. Glucose 244 this am.   Increase Levemir to 30 units (home dose).  Thanks,  Tama Headings RN, MSN, BC-ADM Inpatient Diabetes Coordinator Team Pager [phone] (8a-5p)

## 2019-12-06 NOTE — Progress Notes (Addendum)
NAME:  Patrick Miranda, MRN:  [id number], DOB:  [birth year], LOS: 4 ADMISSION DATE:  12/02/2019, CONSULTATION DATE:  12/06/19 REFERRING MD:  EDP, CHIEF COMPLAINT:  PNA   Brief History   74 year old male presented with several days of shortness of breath and developed chest pain on 5/14.  He was found to have Covid pneumonia, new onset atrial fibrillation with RVR, and bilateral pulmonary emboli.  PCCM consulted for admission.  History of present illness   Patrick Miranda is a 74 year old male with past medical history of CVA, type 2 diabetes, hyperlipidemia, hypertension, OSA compliant with CPAP who was exposed to Covid-19 around 5/1 then developed shortness of breath 3 days ago.  This progressively worsened and he developed central chest pain today so presented to the emergency department.  He has not received any Covid-19 vaccination.    In the ED, he was initially in atrial fibrillation with RVR with heart rate in the 140s and borderline low blood pressure.  Oxygen saturations 88%.  He was placed on nasal cannula oxygen and given IV metoprolol.  Chest x-ray showed patchy airspace opacities, CTA chest showed PE in the segmental right lower lobe PA and subsegmental left lower lobe PE without evidence of right heart strain.  Covid-19 results were positive along with elevated troponin 4,545, BNP 439, and lactic acid 4.5, WBC 14.3, glucose also significantly elevated >500 with bicarb of 15 and anion gap 19.  EKG without evidence of acute ischemia. Pt was started on a heparin gtt and insulin gtt and PCCM consulted for admission.  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  Significant Hospital Events   5/14 Admit to PCCM>>  Consults:  cardiology  Procedures:  n/a  Significant Diagnostic Tests:   5/14 CXR>>Coarse interstitial and patchy airspace opacities in the bilateral lung bases 5/14 CTA chest>>Partially occlusive thrombus within the bifurcation of the segmental anterior posterior right lower lobe pulmonary arteries as well as within the subsegmental anterior left lower lobe pulmonary arterial. Echocardiogram 5/15 >> regional wall motion abnormality, mild basal inferior/posterior lateral wall hypokinesis, LVEF 50-55%, normal RV size and function  Micro Data:  Sars-Cov-2 5/14>>positive  Blood cultures 5/15 >> UC 5/14>> insignificant growth  Antimicrobials:  Ceftriaxone 5/14 > 5/17 Azithromycin 5/14-5/16 Doxycycline 5/16 >   Interim history/subjective:   Remains on 8 - 10 L/min high flow nasal cannula with adequate saturations 5/18. 10L/min while sleeping Amiodarone discontinued Doxycycline changed to p.o. Worked with PT, OT on 5/17  Objective   Blood pressure 139/64, pulse 66, temperature (!) 97.4 F (36.3 C), temperature source Oral, resp. rate (!) 26, height 5\' 10"  (1.778 m), weight 72.9 kg, SpO2 91 %.        Intake/Output Summary (Last 24 hours) at 12/06/2019 0507 Last data filed at 12/05/2019 1800 Gross per 24 hour  Intake 334.97 ml  Output 775 ml  Net -440.03 ml   Filed Weights   12/02/19 2000 12/03/19 0045  Weight: 79.4 kg 72.9 kg    General: Elderly ill-appearing man, laying comfortably in bed HEENT: Oropharynx clear Neuro: Awake, alert, appropriate, follows commands, answers questions CV: Distant, regular, no murmur PULM: Scattered bilateral inspiratory crackles, no wheezing GI: Nondistended, positive bowel sounds Extremities: No edema Skin: Rash  Resolved Hospital Problem list   Afib with RVR  Assessment & Plan:    Acute hypoxic respiratory  failure secondary to Covid-19 pneumonia and bilateral PE -Pt feeling much improved since arrival, saturating well on 10 L nasal cannula -No right heart strain on CTA P: Heparin transition to Eliquis on  5/17 Continue remdesivir, plan 5 days, dexamethasone plan 10 days Wean FiO2 as able Mobilize, pulmonary hygiene Complete 7-day course of doxycycline for possible CAP Follow culture data Hopefully can move out of ICU status 5/18   Chest Pain with elevated troponin -secondary to PE vs NSTEMI Echocardiogram with regional wall motion abnormality 5/15 P: Check cardiology evaluation, no further recommendations at this time. Continue aspirin, Plavix as ordered   New onset atrial fibrillation with RVR -Converted to sinus rhythm in the ED; new episode this a.m. given metoprolol and loading amiodarone -Likely secondary to acute issues as above P: Appreciate cardiology input Anticoagulation as above Metoprolol available for rate control if needed (has not received) Amiodarone discontinued   Type 2 diabetes with hyperglycemia -Insulin drip initiated in the ED Hemoglobin A1c 9.4 P: Sliding-scale insulin We will coverage 6 units 3 times daily Levemir 20 units nightly   Best practice:  Diet: diabetic  Pain/Anxiety/Delirium protocol (if indicated): N/A VAP protocol (if indicated): N/A DVT prophylaxis: Eliquis, Plavix, aspirin GI prophylaxis: PPI Glucose control: SSI protocol Mobility: Bedrest Code Status: Full code Family Communication: updated wife by phone 5/18 Disposition: ICU >> to progressive 5/18  Labs   CBC: Recent Labs  Lab 12/02/19 1548 12/02/19 1604 12/02/19 1622 12/03/19 0250 12/04/19 0310 12/05/19 0332 12/06/19 0320  WBC 14.3*  --   --  11.0* 13.0* 9.5 8.7  NEUTROABS 12.2*  --   --   --   --   --   --   HGB 13.0   < > 12.6* 11.4* 10.9* 10.9* 11.3*  HCT 44.0   < > 37.0* 36.4* 33.9* 34.7* 34.8*  MCV 95.4  --   --  89.2 90.2 88.7 87.2  PLT 438*  --   --  309 321 336 323   < > = values in this interval not displayed.    Basic Metabolic Panel: Recent Labs  Lab 12/02/19 1548 12/02/19 1548 12/02/19 1604 12/02/19 1604 12/02/19 1622 12/02/19 2212  12/03/19 0250 12/04/19 0310 12/05/19 0332 12/06/19 0320  NA 141   < > 140   < > 139  --  147* 141 136 135  K 5.4*   < > 4.9   < > 4.8  --  4.8 4.7 4.8 4.9  CL 108   < > 111  --   --   --  114* 112* 109 107  CO2 14*  --   --   --   --   --  21* 22 20* 20*  GLUCOSE 556*   < > 547*  --   --   --  150* 144* 274* 294*  BUN 27*   < > 28*  --   --   --  29* 33* 30* 27*  CREATININE 1.72*   < > 1.30*  --   --   --  1.03 1.21 1.15 1.10  CALCIUM 8.9  --   --   --   --   --  8.6* 8.2* 8.0* 8.0*  MG 2.1   < >  --   --   --  2.4 2.3 1.9 1.6* 1.8  PHOS  --   --   --   --   --   --  1.7* 3.8 3.9  --    < > =  values in this interval not displayed.   GFR: Estimated Creatinine Clearance: 60.8 mL/min (by C-G formula based on SCr of 1.1 mg/dL). Recent Labs  Lab 12/02/19 1548 12/02/19 1558 12/02/19 1715 12/02/19 1816 12/03/19 0250 12/04/19 0310 12/05/19 0332 12/06/19 0320  PROCALCITON  --  0.13  --   --   --   --   --   --   WBC   < >  --   --   --  11.0* 13.0* 9.5 8.7  LATICACIDVEN  --   --  4.5* 3.2*  --   --   --   --    < > = values in this interval not displayed.    Liver Function Tests: Recent Labs  Lab 12/02/19 1548  AST 39  ALT 13  ALKPHOS 63  BILITOT 1.6*  PROT 6.7  ALBUMIN 2.2*   No results for input(s): LIPASE, AMYLASE in the last 168 hours. No results for input(s): AMMONIA in the last 168 hours.  ABG    Component Value Date/Time   PHART 7.343 (L) 12/02/2019 1622   PCO2ART 25.4 (L) 12/02/2019 1622   PO2ART 60 (L) 12/02/2019 1622   HCO3 13.8 (L) 12/02/2019 1622   TCO2 15 (L) 12/02/2019 1622   ACIDBASEDEF 10.0 (H) 12/02/2019 1622   O2SAT 90.0 12/02/2019 1622     Coagulation Profile: No results for input(s): INR, PROTIME in the last 168 hours.  Cardiac Enzymes: No results for input(s): CKTOTAL, CKMB, CKMBINDEX, TROPONINI in the last 168 hours.  HbA1C: Hgb A1c MFr Bld  Date/Time Value Ref Range Status  12/03/2019 02:50 AM 9.4 (H) 4.8 - 5.6 % Final    Comment:     (NOTE) Pre diabetes:          5.7%-6.4% Diabetes:              >6.4% Glycemic control for   <7.0% adults with diabetes     CBG: Recent Labs  Lab 12/05/19 0345 12/05/19 0736 12/05/19 1209 12/05/19 1538 12/05/19 1916  GLUCAP 256* 194* 304* 253* 207*      Baltazar Apo, MD, PhD 12/06/2019, 5:07 AM [facility] [phone] or if no answer [phone]

## 2019-12-06 NOTE — Progress Notes (Signed)
Replaced Mag of 1.8 per protocol

## 2019-12-06 NOTE — Progress Notes (Signed)
Pt transferred to 5W18 from 4M. Pt in bed, on bedside monitor. Appears comfortable, has no complaints. Condom cath placed on pt. VSS. Call bell placed within reach. Pt instructed to call when needing assistance. Pt accepted explanation. Report given to night shift RN.

## 2019-12-07 ENCOUNTER — Ambulatory Visit: Payer: Medicare Other | Admitting: Podiatry

## 2019-12-07 LAB — CBC
HCT: 37.8 % — ABNORMAL LOW (ref 39.0–52.0)
Hemoglobin: 11.9 g/dL — ABNORMAL LOW (ref 13.0–17.0)
MCH: 28 pg (ref 26.0–34.0)
MCHC: 31.5 g/dL (ref 30.0–36.0)
MCV: 88.9 fL (ref 80.0–100.0)
Platelets: 399 10*3/uL (ref 150–400)
RBC: 4.25 MIL/uL (ref 4.22–5.81)
RDW: 14.1 % (ref 11.5–15.5)
WBC: 9.5 10*3/uL (ref 4.0–10.5)
nRBC: 0 % (ref 0.0–0.2)

## 2019-12-07 LAB — GLUCOSE, CAPILLARY
Glucose-Capillary: 196 mg/dL — ABNORMAL HIGH (ref 70–99)
Glucose-Capillary: 171 mg/dL — ABNORMAL HIGH (ref 70–99)
Glucose-Capillary: 158 mg/dL — ABNORMAL HIGH (ref 70–99)
Glucose-Capillary: 176 mg/dL — ABNORMAL HIGH (ref 70–99)

## 2019-12-07 LAB — BASIC METABOLIC PANEL
Anion gap: 9 (ref 5–15)
BUN: 32 mg/dL — ABNORMAL HIGH (ref 8–23)
CO2: 22 mmol/L (ref 22–32)
Calcium: 8.5 mg/dL — ABNORMAL LOW (ref 8.9–10.3)
Chloride: 105 mmol/L (ref 98–111)
Creatinine, Ser: 1.19 mg/dL (ref 0.61–1.24)
GFR calc Af Amer: >60 mL/min (ref >60)
GFR calc non Af Amer: 60 mL/min — ABNORMAL LOW (ref >60)
Glucose, Bld: 227 mg/dL — ABNORMAL HIGH (ref 70–99)
Potassium: 5.6 mmol/L — ABNORMAL HIGH (ref 3.5–5.1)
Sodium: 136 mmol/L (ref 135–145)

## 2019-12-07 LAB — CULTURE, BLOOD (ROUTINE X 2)
Culture: NO GROWTH
Special Requests: ADEQUATE

## 2019-12-07 LAB — MAGNESIUM: Magnesium: 2 mg/dL (ref 1.7–2.4)

## 2019-12-07 MED ORDER — SODIUM ZIRCONIUM CYCLOSILICATE 10 G PO PACK
10.0000 g | PACK | Freq: Three times a day (TID) | ORAL | Status: AC
  Administered 2019-12-07 (×2): 10 g via ORAL
  Filled 2019-12-07 (×2): qty 1

## 2019-12-07 MED ORDER — PANTOPRAZOLE SODIUM 40 MG PO TBEC
40.0000 mg | DELAYED_RELEASE_TABLET | Freq: Every day | ORAL | Status: DC
  Administered 2019-12-07 – 2019-12-11 (×5): 40 mg via ORAL
  Filled 2019-12-07 (×5): qty 1

## 2019-12-07 NOTE — Discharge Instructions (Signed)
Information on my medicine - ELIQUIS (apixaban)  This medication education was reviewed with me or my healthcare representative as part of my discharge preparation.  The pharmacist that spoke with me during my hospital.  Why was Eliquis prescribed for you? Eliquis was prescribed to treat blood clots that may have been found in the veins of your legs (deep vein thrombosis) or in your lungs (pulmonary embolism) and to reduce the risk of them occurring again.  What do You need to know about Eliquis ? The starting dose is 10 mg (two 5 mg tablets) taken TWICE daily for the FIRST SEVEN (7) DAYS, then on 12/12/19 the dose is reduced to ONE 5 mg tablet taken TWICE daily.  Eliquis may be taken with or without food.   Try to take the dose about the same time in the morning and in the evening. If you have difficulty swallowing the tablet whole please discuss with your pharmacist how to take the medication safely.  Take Eliquis exactly as prescribed and DO NOT stop taking Eliquis without talking to the doctor who prescribed the medication.  Stopping may increase your risk of developing a new blood clot.  Refill your prescription before you run out.  After discharge, you should have regular check-up appointments with your healthcare provider that is prescribing your Eliquis.    What do you do if you miss a dose? If a dose of ELIQUIS is not taken at the scheduled time, take it as soon as possible on the same day and twice-daily administration should be resumed. The dose should not be doubled to make up for a missed dose.  Important Safety Information A possible side effect of Eliquis is bleeding. You should call your healthcare provider right away if you experience any of the following: ? Bleeding from an injury or your nose that does not stop. ? Unusual colored urine (red or dark brown) or unusual colored stools (red or black). ? Unusual bruising for unknown reasons. ? A serious fall or if you hit  your head (even if there is no bleeding).  Some medicines may interact with Eliquis and might increase your risk of bleeding or clotting while on Eliquis. To help avoid this, consult your healthcare provider or pharmacist prior to using any new prescription or non-prescription medications, including herbals, vitamins, non-steroidal anti-inflammatory drugs (NSAIDs) and supplements.  This website has more information on Eliquis (apixaban): http://www.eliquis.com/eliquis/home

## 2019-12-07 NOTE — Progress Notes (Signed)
Occupational Therapy Treatment Patient Details Name: Patrick Miranda MRN: [id number] DOB: Dec 07, 1945 Today's Date: 12/07/2019    History of present illness Pt is a 74 year old man admitted with SOB, + COVID 19, B PEs. PMH: CVA, DM2, HTN, OSA on CPAP, arthritis, depression.   OT comments  Patient supine in bed on arrival stating he feels "halfway" good today.  He was able to get to EOB with supervision an stand with min guard.  Patient remains a little unsteady with dynamic standing balance, requiring one hand support to steady self.  Able to stand and complete grooming at sink with min guard.  He was on 1.5 L O2 at rest with SpO2 at 88.  Once standing, increased O2 to 3L though SpO2 dropped to 79 briefly.  With 4L O2 and max reminders for pursed lip breathing, patient able to maintain 89-90. Required min assist with toilet transfer and mod assist with toileting hygiene.  Would benefit from further work on activity tolerance and balance for improved ALD independence.   Follow Up Recommendations  Home health OT;Supervision/Assistance - 24 hour    Equipment Recommendations  3 in 1 bedside commode    Recommendations for Other Services      Precautions / Restrictions Precautions Precautions: Fall Precaution Comments: desat Restrictions Weight Bearing Restrictions: No       Mobility Bed Mobility Overal bed mobility: Needs Assistance Bed Mobility: Supine to Sit;Sit to Supine     Supine to sit: Supervision Sit to supine: Supervision      Transfers Overall transfer level: Needs assistance Equipment used: 1 person hand held assist Transfers: Sit to/from Stand;Stand Pivot Transfers Sit to Stand: Min guard Stand pivot transfers: Min assist            Balance Overall balance assessment: Needs assistance Sitting-balance support: No upper extremity supported;Feet supported Sitting balance-Leahy Scale: Good     Standing balance support: Single extremity supported;During  functional activity Standing balance-Leahy Scale: Fair Standing balance comment: requires assistance for dynamic mobility                           ADL either performed or assessed with clinical judgement   ADL Overall ADL's : Needs assistance/impaired Eating/Feeding: Sitting;Independent   Grooming: Youth worker: Minimal assistance;RW   Toileting- Clothing Manipulation and Hygiene: Moderate assistance;Sit to/from stand       Functional mobility during ADLs: Minimal assistance       Vision       Perception     Praxis      Cognition Arousal/Alertness: Awake/alert Behavior During Therapy: WFL for tasks assessed/performed Overall Cognitive Status: Within Functional Limits for tasks assessed                                          Exercises     Shoulder Instructions       General Comments      Pertinent Vitals/ Pain       Pain Assessment: No/denies pain  Home Living  Prior Functioning/Environment              Frequency  Min 2X/week        Progress Toward Goals  OT Goals(current goals can now be found in the care plan section)  Progress towards OT goals: Progressing toward goals  Acute Rehab OT Goals Patient Stated Goal: return home OT Goal Formulation: With patient Time For Goal Achievement: 12/19/19 Potential to Achieve Goals: Good  Plan Discharge plan remains appropriate    Co-evaluation                 AM-PAC OT "6 Clicks" Daily Activity     Outcome Measure   Help from another person eating meals?: None Help from another person taking care of personal grooming?: A Little Help from another person toileting, which includes using toliet, bedpan, or urinal?: A Little Help from another person bathing (including washing, rinsing, drying)?: A Little Help from another person to put on and taking off  regular upper body clothing?: A Little Help from another person to put on and taking off regular lower body clothing?: A Little 6 Click Score: 19    End of Session Equipment Utilized During Treatment: Oxygen  OT Visit Diagnosis: Unsteadiness on feet (R26.81);Other abnormalities of gait and mobility (R26.89)   Activity Tolerance Patient tolerated treatment well   Patient Left in bed;with call bell/phone within reach   Nurse Communication Mobility status        Time: [id number] OT Time Calculation (min): 38 min  Charges: OT General Charges $OT Visit: 1 Visit OT Treatments $Self Care/Home Management : 38-52 mins  August Luz, OTR/L    Phylliss Bob 12/07/2019, 10:35 AM

## 2019-12-07 NOTE — Progress Notes (Signed)
PROGRESS NOTE                                                                                                                                                                                                             Patient Demographics:    Patrick Miranda, is a 74 y.o. male, DOB - 26-Dec-1945, [id number]  Admit date - 12/02/2019   Admitting Physician Cheryll Dessert, MD  Outpatient Primary MD for the patient is Burnard Bunting, MD  LOS - 5   Chief Complaint  Patient presents with  . Shortness of Breath       Brief Narrative    Patrick Miranda is a 74 year old male with past medical history of CVA, type 2 diabetes, hyperlipidemia, hypertension, OSA compliant with CPAP who was exposed to Covid-19 around 5/1 then developed shortness of breath 3 days ago.  This progressively worsened and he developed central chest pain today so presented to the emergency department.  He has not received any Covid-19 vaccination.    In the ED, he was initially in atrial fibrillation with RVR with heart rate in the 140s and borderline low blood pressure.  Oxygen saturations 88%.  He was placed on nasal cannula oxygen and given IV metoprolol.  Chest x-ray showed patchy airspace opacities, CTA chest showed PE in the segmental right lower lobe PA and subsegmental left lower lobe PE without evidence of right heart strain.  Covid-19 results were positive along with elevated troponin 4,545, BNP 439, and lactic acid 4.5, WBC 14.3, glucose also significantly elevated >500 with bicarb of 15 and anion gap 19.  EKG without evidence of acute ischemia. Pt was started on a heparin gtt and insulin gtt .  Subjective:    Patrick Miranda today has, No headache, No chest pain, No abdominal pain - No Nausea, No new weakness tingling or numbness, reports his dyspnea is improving.   Assessment  & Plan :    Active Problems:   COVID-19 virus infection    Acute hypoxic respiratory  failure secondary to Covid-19 pneumonia and bilateral PE -Hypoxia on presentation, requiring 10 L high flow nasal cannula, he has improved today, he is on 2 L nasal cannula at rest . -He is on heparin GTT initially, currently transitioned to Eliquis on 5/17  -Treated with IV remdesivir x5 days  -Continue with  steroids  -  He was encouraged to use incentive spirometry, flutter valve, and to ambulate out of bed to chair. -Complete 7-day course of doxycycline for possible CAP  Chest Pain with elevated troponin -secondary to PE vs NSTEMI - Echocardiogram with regional wall motion abnormality 5/15 -  cardiology evaluation, no further recommendations at this time. Continue aspirin, Plavix as ordered   New onset atrial fibrillation with RVR -Converted to sinus rhythm in the ED, with recurrent episodes of A. fib before stay,. -He is on Eliquis. -Currently back to normal sinus rhythm, on as needed metoprolol for heart rate control, amiodarone has been discontinued.  Type 2 diabetes with hyperglycemia -Insulin drip initiated in the ED - Hemoglobin A1c 9.4 - continoue Sliding-scale insulin and 6 units 3 times daily and Levemir 20 units nightly.  Hyperkalemia -Potassium elevated at 5.6, will stop potassium supplement and give Lokelma.  COVID-19 Labs  No results for input(s): DDIMER, FERRITIN, LDH, CRP in the last 72 hours.  Lab Results  Component Value Date   [place] (A) 12/02/2019   [place] Not Detected 07/13/2019     Code Status : Full  Family Communication  : None  Disposition Plan  :  Status is: Inpatient  Remains inpatient appropriate because:Hemodynamically unstable   Dispo: The patient is from: Home              Anticipated d/c is to: Home              Anticipated d/c date is: 2 days              Patient currently is not medically stable to d/c.         Barriers For Discharge :   Consults  :  PCCM  Procedures  : None  DVT Prophylaxis  :   Heparin GTT>> Eliquis  Lab Results  Component Value Date   PLT 399 12/07/2019    Antibiotics  :    Anti-infectives (From admission, onward)   Start     Dose/Rate Route Frequency Ordered Stop   12/05/19 1100  doxycycline (VIBRA-TABS) tablet 100 mg     100 mg Oral Every 12 hours 12/05/19 1046 12/10/19 0959   12/04/19 0400  doxycycline (VIBRAMYCIN) 100 mg in sodium chloride 0.9 % 250 mL IVPB  Status:  Discontinued     100 mg 125 mL/hr over 120 Minutes Intravenous Every 12 hours 12/03/19 1425 12/05/19 1046   12/03/19 2300  cefTRIAXone (ROCEPHIN) 2 g in sodium chloride 0.9 % 100 mL IVPB  Status:  Discontinued     2 g 200 mL/hr over 30 Minutes Intravenous Every 24 hours 12/03/19 1104 12/05/19 1046   12/03/19 1600  remdesivir 100 mg in sodium chloride 0.9 % 100 mL IVPB     100 mg 200 mL/hr over 30 Minutes Intravenous Daily 12/02/19 2146 12/06/19 1051   12/02/19 2300  cefTRIAXone (ROCEPHIN) 1 g in sodium chloride 0.9 % 100 mL IVPB  Status:  Discontinued     1 g 200 mL/hr over 30 Minutes Intravenous Every 24 hours 12/02/19 2259 12/03/19 1104   12/02/19 2300  azithromycin (ZITHROMAX) 500 mg in sodium chloride 0.9 % 250 mL IVPB  Status:  Discontinued     500 mg 250 mL/hr over 60 Minutes Intravenous Every 24 hours 12/02/19 2259 12/03/19 1425   12/02/19 2200  remdesivir 200 mg in sodium chloride 0.9% 250 mL IVPB     200 mg 580 mL/hr over 30 Minutes Intravenous Once 12/02/19 2146 12/03/19 0036  Objective:   Vitals:   12/07/19 0424 12/07/19 0740 12/07/19 0800 12/07/19 1159  BP:  (!) 165/66 (!) 152/66 138/66  Pulse:  (!) 58 62 72  Resp:  18 19 (!) 21  Temp: 97.8 F (36.6 C) 98 F (36.7 C)  98.5 F (36.9 C)  TempSrc: Oral Oral  Oral  SpO2:  93% 92% 94%  Weight: 80.3 kg     Height:        Wt Readings from Last 3 Encounters:  12/07/19 80.3 kg  07/12/18 81.6 kg  07/10/17 78.9 kg     Intake/Output Summary (Last 24 hours) at 12/07/2019 1422 Last data filed at 12/07/2019  1401 Gross per 24 hour  Intake 1430 ml  Output 3131 ml  Net -1701 ml     Physical Exam  Awake Alert, Oriented X 3, No new F.N deficits, Normal affect Symmetrical Chest wall movement, Good air movement bilaterally, CTAB RRR,No Gallops,Rubs or new Murmurs, No Parasternal Heave +ve B.Sounds, Abd Soft, No tenderness, No rebound - guarding or rigidity. No Cyanosis, Clubbing or edema, No new Rash or bruise      Data Review:    CBC Recent Labs  Lab 12/02/19 1548 12/02/19 1604 12/03/19 0250 12/04/19 0310 12/05/19 0332 12/06/19 0320 12/07/19 0425  WBC 14.3*   < > 11.0* 13.0* 9.5 8.7 9.5  HGB 13.0   < > 11.4* 10.9* 10.9* 11.3* 11.9*  HCT 44.0   < > 36.4* 33.9* 34.7* 34.8* 37.8*  PLT 438*   < > 309 321 336 323 399  MCV 95.4   < > 89.2 90.2 88.7 87.2 88.9  MCH 28.2   < > 27.9 29.0 27.9 28.3 28.0  MCHC 29.5*   < > 31.3 32.2 31.4 32.5 31.5  RDW 13.6   < > 13.6 13.6 13.6 13.5 14.1  LYMPHSABS 1.2  --   --   --   --   --   --   MONOABS 0.9  --   --   --   --   --   --   EOSABS 0.0  --   --   --   --   --   --   BASOSABS 0.0  --   --   --   --   --   --    < > = values in this interval not displayed.    Chemistries  Recent Labs  Lab 12/02/19 1548 12/02/19 1604 12/03/19 0250 12/04/19 0310 12/05/19 0332 12/06/19 0320 12/07/19 0425  NA 141   < > 147* 141 136 135 136  K 5.4*   < > 4.8 4.7 4.8 4.9 5.6*  CL 108   < > 114* 112* 109 107 105  CO2 14*   < > 21* 22 20* 20* 22  GLUCOSE 556*   < > 150* 144* 274* 294* 227*  BUN 27*   < > 29* 33* 30* 27* 32*  CREATININE 1.72*   < > 1.03 1.21 1.15 1.10 1.19  CALCIUM 8.9   < > 8.6* 8.2* 8.0* 8.0* 8.5*  MG 2.1   < > 2.3 1.9 1.6* 1.8 2.0  AST 39  --   --   --   --   --   --   ALT 13  --   --   --   --   --   --   ALKPHOS 63  --   --   --   --   --   --  BILITOT 1.6*  --   --   --   --   --   --    < > = values in this interval not displayed.    ------------------------------------------------------------------------------------------------------------------ No results for input(s): CHOL, HDL, LDLCALC, TRIG, CHOLHDL, LDLDIRECT in the last 72 hours.  Lab Results  Component Value Date   HGBA1C 9.4 (H) 12/03/2019   ------------------------------------------------------------------------------------------------------------------ No results for input(s): TSH, T4TOTAL, T3FREE, THYROIDAB in the last 72 hours.  Invalid input(s): FREET3 ------------------------------------------------------------------------------------------------------------------ No results for input(s): VITAMINB12, FOLATE, FERRITIN, TIBC, IRON, RETICCTPCT in the last 72 hours.  Coagulation profile No results for input(s): INR, PROTIME in the last 168 hours.  No results for input(s): DDIMER in the last 72 hours.  Cardiac Enzymes No results for input(s): CKMB, TROPONINI, MYOGLOBIN in the last 168 hours.  Invalid input(s): CK ------------------------------------------------------------------------------------------------------------------    Component Value Date/Time   BNP 439.8 (H) 12/02/2019 1558    Inpatient Medications  Scheduled Meds: . apixaban  10 mg Oral BID   Followed by  . [START ON 12/12/2019] apixaban  5 mg Oral BID  . canagliflozin  100 mg Oral QAC breakfast  . Chlorhexidine Gluconate Cloth  6 each Topical Daily  . clopidogrel  75 mg Oral Q breakfast  . dexamethasone (DECADRON) injection  6 mg Intravenous Q24H  . doxycycline  100 mg Oral Q12H  . insulin aspart  0-20 Units Subcutaneous TID WC  . insulin aspart  6 Units Subcutaneous TID WC  . insulin detemir  20 Units Subcutaneous QHS  . mouth rinse  15 mL Mouth Rinse BID  . metFORMIN  1,000 mg Oral BID WC  . pantoprazole  40 mg Oral Daily  . potassium & sodium phosphates  1 packet Oral TID WC & HS  . rosuvastatin  40 mg Oral Daily  . sodium chloride flush  10-40 mL Intracatheter Q12H    Continuous Infusions: . sodium chloride Stopped (12/05/19 0626)  . sodium chloride 10 mL/hr at 12/06/19 1200   PRN Meds:.sodium chloride, docusate sodium, pneumococcal 23 valent vaccine, polyethylene glycol, sodium chloride, sodium chloride flush  Micro Results Recent Results (from the past 240 hour(s))  SARS Coronavirus 2 by RT PCR (hospital order, performed in [facility] hospital lab) Nasopharyngeal Nasopharyngeal Swab     Status: Abnormal   Collection Time: 12/02/19  4:12 PM   Specimen: Nasopharyngeal Swab  Result Value Ref Range Status   SARS Coronavirus 2 POSITIVE (A) NEGATIVE Final    Comment: RESULT CALLED TO, READ BACK BY AND VERIFIED WITH: L VENEGAS RN [id number] 12/02/19 A [place] (NOTE) SARS-CoV-2 target nucleic acids are DETECTED SARS-CoV-2 RNA is generally detectable in upper respiratory specimens  during the acute phase of infection.  Positive results are indicative  of the presence of the identified virus, but do not rule out bacterial infection or co-infection with other pathogens not detected by the test.  Clinical correlation with patient history and  other diagnostic information is necessary to determine patient infection status.  The expected result is negative. Fact Sheet for Patients:   StrictlyIdeas.no  Fact Sheet for Healthcare Providers:   BankingDealers.co.za   This test is not yet approved or cleared by the [place] FDA and  has been authorized for detection and/or diagnosis of SARS-CoV-2 by FDA under an Emergency Use Authorization (EUA).  This EUA will remain in effect (meaning this test can  be used) for the duration of  the COVID-19 declaration under Section 564(b)(1) of the Act, 21 U.S.C. section 360-bbb-3(b)(1),  unless the authorization is terminated or revoked sooner. Performed at [facility], [place] [address] [place], [place] [address]   Urine culture     Status: Abnormal   Collection  Time: 12/02/19  5:17 PM   Specimen: Urine, Random  Result Value Ref Range Status   Specimen Description URINE, RANDOM  Final   Special Requests NONE  Final   Culture (A)  Final    <10,000 COLONIES/mL INSIGNIFICANT GROWTH Performed at [facility], [place] [address], [place], [place] [address]    Report Status 12/03/2019 FINAL  Final  Culture, blood (routine x 2)     Status: None   Collection Time: 12/02/19 11:10 PM   Specimen: BLOOD  Result Value Ref Range Status   Specimen Description BLOOD RIGHT ANTECUBITAL  Final   Special Requests   Final    BOTTLES DRAWN AEROBIC AND ANAEROBIC Blood Culture adequate volume   Culture   Final    NO GROWTH 5 DAYS Performed at [facility], [place] [address], [place], [place] [address]    Report Status 12/07/2019 FINAL  Final  MRSA PCR Screening     Status: None   Collection Time: 12/03/19  2:08 AM   Specimen: Nasal Mucosa; Nasopharyngeal  Result Value Ref Range Status   MRSA by PCR NEGATIVE NEGATIVE Final    Comment:        The GeneXpert MRSA Assay (FDA approved for NASAL specimens only), is one component of a comprehensive MRSA colonization surveillance program. It is not intended to diagnose MRSA infection nor to guide or monitor treatment for MRSA infections. Performed at [facility], [place] [address], [place], [place] [address]   Culture, blood (routine x 2)     Status: None (Preliminary result)   Collection Time: 12/03/19  2:21 AM   Specimen: BLOOD LEFT HAND  Result Value Ref Range Status   Specimen Description BLOOD LEFT HAND  Final   Special Requests   Final    BOTTLES DRAWN AEROBIC AND ANAEROBIC Blood Culture adequate volume   Culture   Final    NO GROWTH 4 DAYS Performed at [facility], [place] [address], [place],  [address]    Report Status PENDING  Incomplete    Radiology Reports CT Angio Chest PE W/Cm &/Or Wo Cm  Result Date: 12/02/2019 CLINICAL DATA:  Shortness of breath, radiograph shows  possible pneumonia EXAM: CT ANGIOGRAPHY CHEST WITH CONTRAST TECHNIQUE: Multidetector CT imaging of the chest was performed using the standard protocol during bolus administration of intravenous contrast. Multiplanar CT image reconstructions and MIPs were obtained to evaluate the vascular anatomy. CONTRAST:  139mL OMNIPAQUE IOHEXOL 350 MG/ML SOLN COMPARISON:  None. FINDINGS: Cardiovascular: There is a optimal opacification of the pulmonary arteries. There is partially occlusive thrombus seen within the bifurcation of the segmental anterior and posterior right lower lobe subsegmental arterial branch. There is also partially occlusive thrombus seen within the of anterior left lower lobe subsegmental pulmonary arterial branches. No pulmonary embolism seen within the main central pulmonary artery. There is mild cardiomegaly. No pericardial effusion or thickening. No evidence right heart strain. There is normal three-vessel brachiocephalic anatomy without proximal stenosis. Coronary artery calcifications are seen. Scattered mild aortic atherosclerosis is noted. Mediastinum/Nodes: No hilar, mediastinal, or axillary adenopathy. Thyroid gland, trachea, and esophagus demonstrate no significant findings. Lungs/Pleura: Multifocal patchy airspace opacities are seen throughout both lungs, predominantly within the periphery and at the lower lungs. No pleural effusions are seen.  Upper Abdomen: No acute abnormalities present in the visualized portions of the upper abdomen. Musculoskeletal: No chest wall abnormality. No acute or significant osseous findings. Review of the MIP images confirms the above findings. IMPRESSION: 1. Partially occlusive thrombus within the bifurcation of the segmental anterior posterior right lower lobe pulmonary arteries as well as within the subsegmental anterior left lower lobe pulmonary arterial. 2. No evidence of right ventricular heart strain. 3. Multifocal patchy airspace opacities throughout both  lungs, likely consistent with multifocal pneumonia 4. Mild aortic Atherosclerosis (ICD10-I70.0). These results were called by telephone at the time of interpretation on 12/02/2019 at 8:30 pm to provider [facility] , who verbally acknowledged these results. Electronically Signed   By: Prudencio Pair M.D.   On: 12/02/2019 20:37   DG Chest Port 1 View  Result Date: 12/06/2019 CLINICAL DATA:  ARDS EXAM: PORTABLE CHEST 1 VIEW COMPARISON:  12/02/2019 FINDINGS: The cardiac silhouette, mediastinal and hilar contours are stable. Persistent basilar predominant infiltrates and atelectasis. No pleural effusion or pneumothorax. IMPRESSION: Persistent basilar predominant infiltrates and atelectasis. Electronically Signed   By: Marijo Sanes M.D.   On: 12/06/2019 08:14   DG Chest Port 1 View  Result Date: 12/02/2019 CLINICAL DATA:  Shortness of breath EXAM: PORTABLE CHEST 1 VIEW COMPARISON:  Radiograph 04/19/2013 FINDINGS: Coarse interstitial and patchy airspace opacities present in the bilateral lung bases with some more streaky atelectatic changes. Prominence of the cardiomediastinal silhouette may be portable technique related compared to prior PA radiography. No visible pneumothorax. Suspect trace right effusion. No left effusion. No acute osseous or soft tissue abnormality. Degenerative changes are present in the imaged spine and shoulders. Telemetry leads overlie the chest. Pacer pads overlie the chest wall. IMPRESSION: Coarse interstitial and patchy airspace opacities in the bilateral lung bases with some more streaky atelectatic changes. Findings could reflect infection, aspiration or edema. Electronically Signed   By: Lovena Le M.D.   On: 12/02/2019 16:53   ECHOCARDIOGRAM COMPLETE  Result Date: 12/03/2019    ECHOCARDIOGRAM REPORT   Patient Name:   Patrick Miranda Core[place] Date of Exam: 12/03/2019 Medical Rec #:  [id number]          Height:       70.0 in Accession #:    [id number]         Weight:       160.7 lb  Date of Birth:  23-Dec-1945          BSA:          1.902 m Patient Age:    74 years           BP:           139/65 mmHg Patient Gender: M                  HR:           53 bpm. Exam Location:  Inpatient Procedure: 2D Echo Indications:    Atrial Fibrillation  History:        Patient has no prior history of Echocardiogram examinations.                 Arrythmias:Atrial Fibrillation.  Sonographer:    BW Referring Phys: [id number] [place]  1. Mid and basal inferior/posterior lateral wall hypokinesis . Left ventricular ejection fraction, by estimation, is 50 to 55%. The left ventricle has low normal function. The left ventricle demonstrates regional wall motion abnormalities (see scoring diagram/findings for description). The left ventricular internal  cavity size was mildly dilated. Left ventricular diastolic parameters are indeterminate.  2. Right ventricular systolic function is normal. The right ventricular size is normal. There is mildly elevated pulmonary artery systolic pressure.  3. Right atrial size was mildly dilated.  4. The mitral valve is normal in structure. Mild mitral valve regurgitation. No evidence of mitral stenosis.  5. The aortic valve is tricuspid. Aortic valve regurgitation is mild. Mild aortic valve sclerosis is present, with no evidence of aortic valve stenosis.  6. The inferior vena cava is normal in size with greater than 50% respiratory variability, suggesting right atrial pressure of 3 mmHg. FINDINGS  Left Ventricle: Mid and basal inferior/posterior lateral wall hypokinesis. Left ventricular ejection fraction, by estimation, is 50 to 55%. The left ventricle has low normal function. The left ventricle demonstrates regional wall motion abnormalities. The left ventricular internal cavity size was mildly dilated. There is no left ventricular hypertrophy. Left ventricular diastolic parameters are indeterminate. Right Ventricle: The right ventricular size is normal. No increase in  right ventricular wall thickness. Right ventricular systolic function is normal. There is mildly elevated pulmonary artery systolic pressure. The tricuspid regurgitant velocity is 2.62  m/s, and with an assumed right atrial pressure of 10 mmHg, the estimated right ventricular systolic pressure is [id number] mmHg. Left Atrium: Left atrial size was normal in size. Right Atrium: Right atrial size was mildly dilated. Pericardium: There is no evidence of pericardial effusion. Mitral Valve: The mitral valve is normal in structure. There is mild thickening of the mitral valve leaflet(s). There is mild calcification of the mitral valve leaflet(s). Normal mobility of the mitral valve leaflets. Mild mitral valve regurgitation. No evidence of mitral valve stenosis. Tricuspid Valve: The tricuspid valve is normal in structure. Tricuspid valve regurgitation is mild . No evidence of tricuspid stenosis. Aortic Valve: The aortic valve is tricuspid. Aortic valve regurgitation is mild. Mild aortic valve sclerosis is present, with no evidence of aortic valve stenosis. Pulmonic Valve: The pulmonic valve was normal in structure. Pulmonic valve regurgitation is not visualized. No evidence of pulmonic stenosis. Aorta: The aortic root is normal in size and structure. Venous: The inferior vena cava is normal in size with greater than 50% respiratory variability, suggesting right atrial pressure of 3 mmHg. IAS/Shunts: No atrial level shunt detected by color flow Doppler.  LEFT VENTRICLE PLAX 2D LVIDd:         5.60 cm      Diastology LVIDs:         3.90 cm      LV e' lateral:   8.05 cm/s LV PW:         1.10 cm      LV E/e' lateral: 9.0 LV IVS:        1.10 cm      LV e' medial:    7.83 cm/s LVOT diam:     1.80 cm      LV E/e' medial:  9.3 LV SV:         63 LV SV Index:   33 LVOT Area:     2.54 cm  LV Volumes (MOD) LV vol d, MOD A2C: 103.0 ml LV vol d, MOD A4C: 103.4 ml LV vol s, MOD A2C: 44.3 ml LV vol s, MOD A4C: 40.8 ml LV SV MOD A2C:     58.7 ml  LV SV MOD A4C:     103.4 ml LV SV MOD BP:      62.1 ml RIGHT VENTRICLE RV S prime:  12.80 cm/s TAPSE (M-mode): 3.1 cm LEFT ATRIUM             Index       RIGHT ATRIUM           Index LA diam:        3.60 cm 1.89 cm/m  RA Area:     19.63 cm LA Vol (A2C):   59.1 ml 31.07 ml/m RA Volume:   57.43 ml  30.20 ml/m LA Vol (A4C):   43.0 ml 22.61 ml/m LA Biplane Vol: 52.1 ml 27.39 ml/m  AORTIC VALVE LVOT Vmax:   110.00 cm/s LVOT Vmean:  78.700 cm/s LVOT VTI:    0.246 m  AORTA Ao Root diam: 3.20 cm MITRAL VALVE               TRICUSPID VALVE MV Area (PHT): 3.53 cm    TR Peak grad:   27.5 mmHg MV Decel Time: 215 msec    TR Vmax:        262.00 cm/s MV E velocity: 72.70 cm/s MV A velocity: 87.40 cm/s  SHUNTS MV E/A ratio:  0.83        Systemic VTI:  0.25 m                            Systemic Diam: 1.80 cm Jenkins Rouge MD Electronically signed by Jenkins Rouge MD Signature Date/Time: 12/03/2019/5:17:01 PM    Final       Phillips Climes M.D on 12/07/2019 at 2:22 PM   [facility] -  Office  [phone]

## 2019-12-07 NOTE — TOC Progression Note (Signed)
Transition of Care [facility]) - Progression Note    Patient Details  Name: Patrick Miranda MRN: [id number] Date of Birth: Nov 30, 1945  [facility] CM/SW Contact  Maryclare Labrador, RN Phone Number: 12/07/2019, 4:53 PM  Clinical Narrative:   CM informed that pt may have hardship with cost of Eliquis per benefit check.  CM contacted pt via phone and attempted to give Eliquis assistance number however pt politely declined - pt deferred the call to be made by his daughter Patrick Miranda. Pt declined for CM to contact his wife regarding copay assistance process.  CM left VM requesting daughter to call back      Expected Discharge Plan: [place] Barriers to Discharge: Continued Medical Work up  Expected Discharge Plan and Services Expected Discharge Plan: [place] In-house Referral: NA Discharge Planning Services: CM Consult Post Acute Care Choice: Home Health, Durable Medical Equipment Living arrangements for the past 2 months: Single Family Home                           HH Arranged: PT, OT   Date [place]: 12/06/19 Time [place]: 1304 Representative spoke with at [place]: [place] ([place]) Interventions    Readmission Risk Interventions No flowsheet data found.

## 2019-12-08 LAB — COMPREHENSIVE METABOLIC PANEL
ALT: 10 U/L (ref 0–44)
AST: 22 U/L (ref 15–41)
Albumin: 1.9 g/dL — ABNORMAL LOW (ref 3.5–5.0)
Alkaline Phosphatase: 56 U/L (ref 38–126)
Anion gap: 6 (ref 5–15)
BUN: 31 mg/dL — ABNORMAL HIGH (ref 8–23)
CO2: 23 mmol/L (ref 22–32)
Calcium: 8.4 mg/dL — ABNORMAL LOW (ref 8.9–10.3)
Chloride: 107 mmol/L (ref 98–111)
Creatinine, Ser: 1.11 mg/dL (ref 0.61–1.24)
GFR calc Af Amer: >60 mL/min (ref >60)
GFR calc non Af Amer: >60 mL/min (ref >60)
Glucose, Bld: 202 mg/dL — ABNORMAL HIGH (ref 70–99)
Potassium: 5.1 mmol/L (ref 3.5–5.1)
Sodium: 136 mmol/L (ref 135–145)
Total Bilirubin: 0.5 mg/dL (ref 0.3–1.2)
Total Protein: 4.9 g/dL — ABNORMAL LOW (ref 6.5–8.1)

## 2019-12-08 LAB — PHOSPHORUS: Phosphorus: 4.8 mg/dL — ABNORMAL HIGH (ref 2.5–4.6)

## 2019-12-08 LAB — CBC
HCT: 34.2 % — ABNORMAL LOW (ref 39.0–52.0)
Hemoglobin: 11.1 g/dL — ABNORMAL LOW (ref 13.0–17.0)
MCH: 28.6 pg (ref 26.0–34.0)
MCHC: 32.5 g/dL (ref 30.0–36.0)
MCV: 88.1 fL (ref 80.0–100.0)
Platelets: 381 10*3/uL (ref 150–400)
RBC: 3.88 MIL/uL — ABNORMAL LOW (ref 4.22–5.81)
RDW: 14.2 % (ref 11.5–15.5)
WBC: 8.2 10*3/uL (ref 4.0–10.5)
nRBC: 0 % (ref 0.0–0.2)

## 2019-12-08 LAB — CULTURE, BLOOD (ROUTINE X 2)
Culture: NO GROWTH
Special Requests: ADEQUATE

## 2019-12-08 LAB — GLUCOSE, CAPILLARY
Glucose-Capillary: 186 mg/dL — ABNORMAL HIGH (ref 70–99)
Glucose-Capillary: 130 mg/dL — ABNORMAL HIGH (ref 70–99)
Glucose-Capillary: 106 mg/dL — ABNORMAL HIGH (ref 70–99)
Glucose-Capillary: 175 mg/dL — ABNORMAL HIGH (ref 70–99)

## 2019-12-08 MED ORDER — POLYVINYL ALCOHOL 1.4 % OP SOLN: 1.0000 [drp] | OPHTHALMIC | Status: DC | PRN

## 2019-12-08 MED ORDER — LIP MEDEX EX OINT: 1.0000 "application " | TOPICAL_OINTMENT | CUTANEOUS | Status: DC | PRN

## 2019-12-08 MED ORDER — MUSCLE RUB 10-15 % EX CREA: 1.0000 "application " | TOPICAL_CREAM | CUTANEOUS | Status: DC | PRN

## 2019-12-08 MED ORDER — PHENOL 1.4 % MT LIQD: 1.0000 | OROMUCOSAL | Status: DC | PRN

## 2019-12-08 MED ORDER — GLUCERNA SHAKE PO LIQD
237.0000 mL | Freq: Three times a day (TID) | ORAL | Status: DC
  Administered 2019-12-08 – 2019-12-10 (×7): 237 mL via ORAL
  Filled 2019-12-08: qty 237

## 2019-12-08 MED ORDER — ALUM & MAG HYDROXIDE-SIMETH 200-200-20 MG/5ML PO SUSP: 30.0000 mL | ORAL | Status: DC | PRN

## 2019-12-08 MED ORDER — HYDROCORTISONE (PERIANAL) 2.5 % EX CREA: 1.0000 "application " | TOPICAL_CREAM | Freq: Four times a day (QID) | CUTANEOUS | Status: DC | PRN

## 2019-12-08 MED ORDER — HYDROCORTISONE 1 % EX CREA
1.0000 "application " | TOPICAL_CREAM | Freq: Three times a day (TID) | CUTANEOUS | Status: DC
  Administered 2019-12-08: 1 via TOPICAL
  Filled 2019-12-08: qty 28

## 2019-12-08 MED ORDER — GUAIFENESIN-DM 100-10 MG/5ML PO SYRP: 5.0000 mL | ORAL_SOLUTION | ORAL | Status: DC | PRN

## 2019-12-08 MED ORDER — LORATADINE 10 MG PO TABS: 10.0000 mg | ORAL_TABLET | Freq: Every day | ORAL | Status: DC | PRN

## 2019-12-08 MED ORDER — ACETAMINOPHEN 325 MG PO TABS
650.0000 mg | ORAL_TABLET | Freq: Four times a day (QID) | ORAL | Status: DC | PRN
  Administered 2019-12-08: 650 mg via ORAL
  Filled 2019-12-08: qty 2

## 2019-12-08 NOTE — Care Management (Signed)
Per  Beola Cord W/ [facility] @ [phone]  Co-pay amount for Xarelto 48m.15mg.20 mg daily for 30 day supply 118.80.at retail pharmacy.  No PA required Deductible not met Tier 3 Retail Pharmacy : [facility] [place]  Mail Order([facility]) for 90 day supply daily for Xeralto 131m15mg,20mg  $376.86.

## 2019-12-08 NOTE — Consult Note (Signed)
   [facility] Inpatient Consult   12/08/2019  Patrick Miranda [birth year] [id number]  [facility] ACO Patient:  Medicare Next GEN  Primary Care Provider:  Burnard Bunting, MD, this office is listed to provide the transition of care [TOC] follow up.  Referral from Pharmacist regarding potential post hospital support care needs in the [facility] services for new Atrial Fib.  Chart reveals and  Reviewed that patient's HgbA1C is 9.4 on 12/03/2019, as well.  Patient admitted with COVID-19.  Reviewed inpatient Transition of Care RNCM notes.  Plan:  Will follow follow for disposition and needs. Patient is being recommended for home with home health.  Will follow up with inpatient [facility] team for disposition needs and disposition.  Natividad Brood, RN BSN [facility] Liaison  [phone] business mobile phone Toll free office [phone]  Fax number: [phone] [email or web address]

## 2019-12-08 NOTE — Progress Notes (Signed)
Physical Therapy Treatment Patient Details Name: Patrick Miranda MRN: [id number] DOB: [birth year] Today's Date: 12/08/2019    History of Present Illness Pt is a 74 year old man admitted with SOB, + COVID 19, B PEs. PMH: CVA, DM2, HTN, OSA on CPAP, arthritis, depression.    PT Comments    Pt was able to preform hallway ambulation with min to min guard assist for balance (reaching for hallway railing for stability).  Despite being on 1.5 L O2 [place] at rest with sats in the low 90s, pt required 6L O2 [place] during gait for sats out of the low 80s (88% or higher on 6L), attempted 2, 4, and ultimately 6 L.  He is quite dyspnic when he moves 3/4.  He needs encouragement to preform IS hourly. PT will continue to follow acutely for safe mobility progression.   Follow Up Recommendations  Home health PT     Equipment Recommendations  Other (comment);Rolling walker with 5" wheels(home O2)    Recommendations for Other Services   NA     Precautions / Restrictions Precautions Precautions: Fall Precaution Comments: desat    Mobility  Bed Mobility Overal bed mobility: Modified Independent                Transfers Overall transfer level: Needs assistance Equipment used: None Transfers: Sit to/from Stand Sit to Stand: Min guard         General transfer comment: Min guard assist for balance and safety cues to stand a moment longer to ensure that he doesn't get lightheaded.  Pt only reports top of head pressure with standing and walking.   Ambulation/Gait Ambulation/Gait assistance: Min guard;Min assist Gait Distance (Feet): 100 Feet Assistive device: 1 person hand held assist(and/or hallway railing. ) Gait Pattern/deviations: Step-through pattern;Staggering right;Staggering left Gait velocity: decreased Gait velocity interpretation: 1.31 - 2.62 ft/sec, indicative of limited community ambulator General Gait Details: Pt with staggering gait pattern, min guard assist for safety and  balance during gait.  DOE 3/4 and pt desaturated before we got to the sink on RA, attempted 2, 4, and ultimately had to give 6L O2 [place] for gait to maintain sats that were higher than low 80s.  At rest, pt was in the low 90s on 1.5 L O2 [place]          Balance Overall balance assessment: Needs assistance Sitting-balance support: Feet supported;No upper extremity supported Sitting balance-Leahy Scale: Good     Standing balance support: Single extremity supported Standing balance-Leahy Scale: Poor Standing balance comment: needs external support in standing.                             Cognition Arousal/Alertness: Awake/alert Behavior During Therapy: WFL for tasks assessed/performed Overall Cognitive Status: Within Functional Limits for tasks assessed                                        Exercises Other Exercises Other Exercises: Pt reports he will do his incentive spirometer "later" despite PT encouragement to show me how he was doing with it.  Verbal use instructions reviewed.         Pertinent Vitals/Pain Pain Assessment: No/denies pain           PT Goals (current goals can now be found in the care plan section) Acute Rehab PT Goals Patient Stated Goal:  return home Progress towards PT goals: Progressing toward goals    Frequency    Min 3X/week      PT Plan Current plan remains appropriate       AM-PAC PT "6 Clicks" Mobility   Outcome Measure  Help needed turning from your back to your side while in a flat bed without using bedrails?: A Little Help needed moving from lying on your back to sitting on the side of a flat bed without using bedrails?: A Little Help needed moving to and from a bed to a chair (including a wheelchair)?: A Little Help needed standing up from a chair using your arms (e.g., wheelchair or bedside chair)?: A Little Help needed to walk in hospital room?: A Little Help needed climbing 3-5 steps with a railing? : A  Lot 6 Click Score: 17    End of Session Equipment Utilized During Treatment: Oxygen Activity Tolerance: Patient limited by fatigue;Other (comment)(limited by DOE) Patient left: in chair;with call bell/phone within reach Nurse Communication: Mobility status PT Visit Diagnosis: Unsteadiness on feet (R26.81);Muscle weakness (generalized) (M62.81);Difficulty in walking, not elsewhere classified (R26.2);Other (comment)     Time: [id number] PT Time Calculation (min) (ACUTE ONLY): 17 min  Charges:  $Gait Training: 8-22 mins                    Verdene Lennert, PT, DPT  Acute Rehabilitation [phone] pager #[phone] office     12/08/2019, 4:42 PM

## 2019-12-08 NOTE — TOC Progression Note (Signed)
Transition of Care (TOC) - Progression Note    Patient Details  Name: Kojo Cindrich Nave MRN: [id number] Date of Birth: 02/12/1946  Transition of Care [facility]) CM/SW Contact  Tymeka Privette, Abelino Derrick, RN Phone Number: 12/08/2019, 4:22 PM  Clinical Narrative:   CM left VM for daughter regarding [employer] patient assistance  Update 15:  CM has not received call back from daughter.  CM contacted pt; pt now telling CM that his daughter had not had a chance yet to call , "its not really worth the bother".  CM asked pt to clarify statement.  Pt informed CM that his income is likely too high.  CM explained to pt that the initial 30 days will be free however after that the copay will be approximately $120 a month as previously communicated.  CM urged pt to at least call the assistance number and see if they can reduce his copay any.  Pt informed CM that he will place the copay on his credit card.    Expected Discharge Plan: [place] Barriers to Discharge: Continued Medical Work up  Expected Discharge Plan and Services Expected Discharge Plan: [place] In-house Referral: NA Discharge Planning Services: CM Consult Post Acute Care Choice: Home Health, Durable Medical Equipment Living arrangements for the past 2 months: Single Family Home                           HH Arranged: PT, OT   Date HH Agency Contacted: 12/06/19 Time [place]: 1304 Representative spoke with at [place]: [place] ([place]) Interventions    Readmission Risk Interventions No flowsheet data found.

## 2019-12-08 NOTE — Progress Notes (Addendum)
SATURATION QUALIFICATIONS: (This note is used to comply with regulatory documentation for home oxygen)  Patient Saturations on Room Air at Rest = 93%  Patient Saturations on Room Air while Ambulating = 82%  Patient Saturations on 6 Liters of oxygen while Ambulating = 88%  Please briefly explain why patient needs home oxygen: Pt desaturates on RA during gait. DOE 3/4, slow speed and needed 6 L O2 [place] to get out of the low 80s while ambulating. Only needed 1.5 L at rest to maintain in the low 90s.    Verdene Lennert, PT, DPT  Acute Rehabilitation [phone] pager [phone] office

## 2019-12-08 NOTE — Progress Notes (Signed)
PROGRESS NOTE                                                                                                                                                                                                             Patient Demographics:    Patrick Miranda, is a 74 y.o. male, DOB - 01-06-1946, [id number]  Admit date - 12/02/2019   Admitting Physician Cheryll Dessert, MD  Outpatient Primary MD for the patient is Burnard Bunting, MD  LOS - 6   Chief Complaint  Patient presents with  . Shortness of Breath       Brief Narrative    [name] is a 74 year old male with past medical history of CVA, type 2 diabetes, hyperlipidemia, hypertension, OSA compliant with CPAP who was exposed to Covid-19 around 5/1 then developed shortness of breath 3 days ago.  This progressively worsened and he developed central chest pain today so presented to the emergency department.  He has not received any Covid-19 vaccination.    In the ED, he was initially in atrial fibrillation with RVR with heart rate in the 140s and borderline low blood pressure.  Oxygen saturations 88%.  He was placed on nasal cannula oxygen and given IV metoprolol.  Chest x-ray showed patchy airspace opacities, CTA chest showed PE in the segmental right lower lobe PA and subsegmental left lower lobe PE without evidence of right heart strain.  Covid-19 results were positive along with elevated troponin 4,545, BNP 439, and lactic acid 4.5, WBC 14.3, glucose also significantly elevated >500 with bicarb of 15 and anion gap 19.  EKG without evidence of acute ischemia. Pt was started on a heparin gtt and insulin gtt .  Subjective:    [employer] today has, No headache, No chest pain, No abdominal pain - No Nausea, No new weakness tingling or numbness, reports his dyspnea is improving.   Assessment  & Plan :    Active Problems:   COVID-19 virus infection    Acute hypoxic respiratory  failure secondary to Covid-19 pneumonia and bilateral PE -Hypoxia on presentation, requiring 10 L high flow nasal cannula, he has improved today, he is on 2 L nasal cannula at rest , requiring 4 L with activity, discussed with staff to see his oxygen requirement with activity today. -He is on heparin GTT initially, currently transitioned  to Eliquis on 5/17  -Treated with IV remdesivir x5 days  -Continue with  steroids  -He was encouraged to use incentive spirometry, flutter valve, and to ambulate out of bed to chair. -Complete 7-day course of doxycycline for possible CAP  Chest Pain with elevated troponin -Secondary to demand ischemia - Echocardiogram with regional wall motion abnormality 5/15 - cardiology evaluation, no further recommendations at this time , continue home medication including Plavix.   New onset atrial fibrillation with RVR -Converted to sinus rhythm in the ED, with recurrent episodes of A. fib before stay,. -He is on Eliquis. -Currently back to normal sinus rhythm, on as needed metoprolol for heart rate control, did require amiodarone initially, which currently has been discontinued.  Type 2 diabetes with hyperglycemia -Insulin drip initiated in the ED - Hemoglobin A1c 9.4 - continoue Sliding-scale insulin and 6 units 3 times daily and Levemir 20 units nightly.  Hyperkalemia -Has improved after given Lokelma, and hold on potassium/Neutra-Phos supplements  COVID-19 Labs  No results for input(s): DDIMER, FERRITIN, LDH, CRP in the last 72 hours.  Lab Results  Component Value Date   Bartlett (A) 12/02/2019   Robbins Not Detected 07/13/2019     Code Status : Full  Family Communication  : None  Disposition Plan  :  Status is: Inpatient  Remains inpatient appropriate because:Hemodynamically unstable   Dispo: The patient is from: Home              Anticipated d/c is to: Home              Anticipated d/c date is: 2 days               Patient currently is not medically stable to d/c.         Barriers For Discharge :   Consults  :  PCCM  Procedures  : None  DVT Prophylaxis  :  Heparin GTT>> Eliquis  Lab Results  Component Value Date   PLT 381 12/08/2019    Antibiotics  :    Anti-infectives (From admission, onward)   Start     Dose/Rate Route Frequency Ordered Stop   12/05/19 1100  doxycycline (VIBRA-TABS) tablet 100 mg     100 mg Oral Every 12 hours 12/05/19 1046 12/10/19 0959   12/04/19 0400  doxycycline (VIBRAMYCIN) 100 mg in sodium chloride 0.9 % 250 mL IVPB  Status:  Discontinued     100 mg 125 mL/hr over 120 Minutes Intravenous Every 12 hours 12/03/19 1425 12/05/19 1046   12/03/19 2300  cefTRIAXone (ROCEPHIN) 2 g in sodium chloride 0.9 % 100 mL IVPB  Status:  Discontinued     2 g 200 mL/hr over 30 Minutes Intravenous Every 24 hours 12/03/19 1104 12/05/19 1046   12/03/19 1600  remdesivir 100 mg in sodium chloride 0.9 % 100 mL IVPB     100 mg 200 mL/hr over 30 Minutes Intravenous Daily 12/02/19 2146 12/06/19 1051   12/02/19 2300  cefTRIAXone (ROCEPHIN) 1 g in sodium chloride 0.9 % 100 mL IVPB  Status:  Discontinued     1 g 200 mL/hr over 30 Minutes Intravenous Every 24 hours 12/02/19 2259 12/03/19 1104   12/02/19 2300  azithromycin (ZITHROMAX) 500 mg in sodium chloride 0.9 % 250 mL IVPB  Status:  Discontinued     500 mg 250 mL/hr over 60 Minutes Intravenous Every 24 hours 12/02/19 2259 12/03/19 1425   12/02/19 2200  remdesivir 200 mg in sodium chloride 0.9% 250  mL IVPB     200 mg 580 mL/hr over 30 Minutes Intravenous Once 12/02/19 2146 12/03/19 0036        Objective:   Vitals:   12/08/19 0440 12/08/19 0744 12/08/19 1154 12/08/19 1301  BP:  129/63 127/72   Pulse:  64 76   Resp:  14 20   Temp:  98.2 F (36.8 C) 97.8 F (36.6 C)   TempSrc:  Oral Oral   SpO2:  93% 93% 95%  Weight: 79.3 kg     Height:        Wt Readings from Last 3 Encounters:  12/08/19 79.3 kg  07/12/18 81.6 kg    07/10/17 78.9 kg     Intake/Output Summary (Last 24 hours) at 12/08/2019 1316 Last data filed at 12/08/2019 1100 Gross per 24 hour  Intake 360 ml  Output 3030 ml  Net -2670 ml     Physical Exam  Awake Alert, Oriented X 3, No new F.N deficits, Normal affect Symmetrical Chest wall movement, Good air movement bilaterally, CTAB RRR,No Gallops,Rubs or new Murmurs, No Parasternal Heave +ve B.Sounds, Abd Soft, No tenderness, No rebound - guarding or rigidity. No Cyanosis, Clubbing or edema, No new Rash or bruise      Data Review:    CBC Recent Labs  Lab 12/02/19 1548 12/02/19 1604 12/04/19 0310 12/05/19 0332 12/06/19 0320 12/07/19 0425 12/08/19 0500  WBC 14.3*   < > 13.0* 9.5 8.7 9.5 8.2  HGB 13.0   < > 10.9* 10.9* 11.3* 11.9* 11.1*  HCT 44.0   < > 33.9* 34.7* 34.8* 37.8* 34.2*  PLT 438*   < > 321 336 323 399 381  MCV 95.4   < > 90.2 88.7 87.2 88.9 88.1  MCH 28.2   < > 29.0 27.9 28.3 28.0 28.6  MCHC 29.5*   < > 32.2 31.4 32.5 31.5 32.5  RDW 13.6   < > 13.6 13.6 13.5 14.1 14.2  LYMPHSABS 1.2  --   --   --   --   --   --   MONOABS 0.9  --   --   --   --   --   --   EOSABS 0.0  --   --   --   --   --   --   BASOSABS 0.0  --   --   --   --   --   --    < > = values in this interval not displayed.    Chemistries  Recent Labs  Lab 12/02/19 1548 12/02/19 1604 12/03/19 0250 12/03/19 0250 12/04/19 0310 12/05/19 0332 12/06/19 0320 12/07/19 0425 12/08/19 0500  NA 141   < > 147*   < > 141 136 135 136 136  K 5.4*   < > 4.8   < > 4.7 4.8 4.9 5.6* 5.1  CL 108   < > 114*   < > 112* 109 107 105 107  CO2 14*   < > 21*   < > 22 20* 20* 22 23  GLUCOSE 556*   < > 150*   < > 144* 274* 294* 227* 202*  BUN 27*   < > 29*   < > 33* 30* 27* 32* 31*  CREATININE 1.72*   < > 1.03   < > 1.21 1.15 1.10 1.19 1.11  CALCIUM 8.9   < > 8.6*   < > 8.2* 8.0* 8.0* 8.5* 8.4*  MG 2.1   < > 2.3  --  1.9 1.6* 1.8 2.0  --   AST 39  --   --   --   --   --   --   --  22  ALT 13  --   --   --   --    --   --   --  10  ALKPHOS 63  --   --   --   --   --   --   --  56  BILITOT 1.6*  --   --   --   --   --   --   --  0.5   < > = values in this interval not displayed.   ------------------------------------------------------------------------------------------------------------------ No results for input(s): CHOL, HDL, LDLCALC, TRIG, CHOLHDL, LDLDIRECT in the last 72 hours.  Lab Results  Component Value Date   HGBA1C 9.4 (H) 12/03/2019   ------------------------------------------------------------------------------------------------------------------ No results for input(s): TSH, T4TOTAL, T3FREE, THYROIDAB in the last 72 hours.  Invalid input(s): FREET3 ------------------------------------------------------------------------------------------------------------------ No results for input(s): VITAMINB12, FOLATE, FERRITIN, TIBC, IRON, RETICCTPCT in the last 72 hours.  Coagulation profile No results for input(s): INR, PROTIME in the last 168 hours.  No results for input(s): DDIMER in the last 72 hours.  Cardiac Enzymes No results for input(s): CKMB, TROPONINI, MYOGLOBIN in the last 168 hours.  Invalid input(s): CK ------------------------------------------------------------------------------------------------------------------    Component Value Date/Time   BNP 439.8 (H) 12/02/2019 1558    Inpatient Medications  Scheduled Meds: . apixaban  10 mg Oral BID   Followed by  . [START ON 12/12/2019] apixaban  5 mg Oral BID  . canagliflozin  100 mg Oral QAC breakfast  . Chlorhexidine Gluconate Cloth  6 each Topical Daily  . clopidogrel  75 mg Oral Q breakfast  . dexamethasone (DECADRON) injection  6 mg Intravenous Q24H  . doxycycline  100 mg Oral Q12H  . feeding supplement (GLUCERNA SHAKE)  237 mL Oral TID BM  . insulin aspart  0-20 Units Subcutaneous TID WC  . insulin aspart  6 Units Subcutaneous TID WC  . insulin detemir  20 Units Subcutaneous QHS  . mouth rinse  15 mL Mouth  Rinse BID  . metFORMIN  1,000 mg Oral BID WC  . pantoprazole  40 mg Oral Daily  . sodium chloride flush  10-40 mL Intracatheter Q12H   Continuous Infusions: . sodium chloride Stopped (12/05/19 0626)  . sodium chloride 10 mL/hr at 12/06/19 1200   PRN Meds:.sodium chloride, docusate sodium, pneumococcal 23 valent vaccine, polyethylene glycol, sodium chloride, sodium chloride flush  Micro Results Recent Results (from the past 240 hour(s))  SARS Coronavirus 2 by RT PCR (hospital order, performed in [place] hospital lab) Nasopharyngeal Nasopharyngeal Swab     Status: Abnormal   Collection Time: 12/02/19  4:12 PM   Specimen: Nasopharyngeal Swab  Result Value Ref Range Status   SARS Coronavirus 2 POSITIVE (A) NEGATIVE Final    Comment: RESULT CALLED TO, READ BACK BY AND VERIFIED WITH: L VENEGAS RN [id number] 12/02/19 A BROWNING (NOTE) SARS-CoV-2 target nucleic acids are DETECTED SARS-CoV-2 RNA is generally detectable in upper respiratory specimens  during the acute phase of infection.  Positive results are indicative  of the presence of the identified virus, but do not rule out bacterial infection or co-infection with other pathogens not detected by the test.  Clinical correlation with patient history and  other diagnostic information is necessary to determine patient infection status.  The expected result is negative. Fact Sheet for Patients:  StrictlyIdeas.no  Fact Sheet for Healthcare Providers:   BankingDealers.co.za   This test is not yet approved or cleared by the [place] FDA and  has been authorized for detection and/or diagnosis of SARS-CoV-2 by FDA under an Emergency Use Authorization (EUA).  This EUA will remain in effect (meaning this test can  be used) for the duration of  the COVID-19 declaration under Section 564(b)(1) of the Act, 21 U.S.C. section 360-bbb-3(b)(1), unless the authorization is terminated or revoked  sooner. Performed at [facility], [place] [address] [place], [place] [address]   Urine culture     Status: Abnormal   Collection Time: 12/02/19  5:17 PM   Specimen: Urine, Random  Result Value Ref Range Status   Specimen Description URINE, RANDOM  Final   Special Requests NONE  Final   Culture (A)  Final    <10,000 COLONIES/mL INSIGNIFICANT GROWTH Performed at [facility], [place] [address], [place], [place] [address]    Report Status 12/03/2019 FINAL  Final  Culture, blood (routine x 2)     Status: None   Collection Time: 12/02/19 11:10 PM   Specimen: BLOOD  Result Value Ref Range Status   Specimen Description BLOOD RIGHT ANTECUBITAL  Final   Special Requests   Final    BOTTLES DRAWN AEROBIC AND ANAEROBIC Blood Culture adequate volume   Culture   Final    NO GROWTH 5 DAYS Performed at [facility], [place] [address], [place], [place] [address]    Report Status 12/07/2019 FINAL  Final  MRSA PCR Screening     Status: None   Collection Time: 12/03/19  2:08 AM   Specimen: Nasal Mucosa; Nasopharyngeal  Result Value Ref Range Status   MRSA by PCR NEGATIVE NEGATIVE Final    Comment:        The GeneXpert MRSA Assay (FDA approved for NASAL specimens only), is one component of a comprehensive MRSA colonization surveillance program. It is not intended to diagnose MRSA infection nor to guide or monitor treatment for MRSA infections. Performed at [facility], [place] [address], [place], [place] [address]   Culture, blood (routine x 2)     Status: None   Collection Time: 12/03/19  2:21 AM   Specimen: BLOOD LEFT HAND  Result Value Ref Range Status   Specimen Description BLOOD LEFT HAND  Final   Special Requests   Final    BOTTLES DRAWN AEROBIC AND ANAEROBIC Blood Culture adequate volume   Culture   Final    NO GROWTH 5 DAYS Performed at [facility], [place] [address] [place],  [address]    Report Status 12/08/2019 FINAL  Final    Radiology  Reports CT Angio Chest PE W/Cm &/Or Wo Cm  Result Date: 12/02/2019 CLINICAL DATA:  Shortness of breath, radiograph shows possible pneumonia EXAM: CT ANGIOGRAPHY CHEST WITH CONTRAST TECHNIQUE: Multidetector CT imaging of the chest was performed using the standard protocol during bolus administration of intravenous contrast. Multiplanar CT image reconstructions and MIPs were obtained to evaluate the vascular anatomy. CONTRAST:  157mL OMNIPAQUE IOHEXOL 350 MG/ML SOLN COMPARISON:  None. FINDINGS: Cardiovascular: There is a optimal opacification of the pulmonary arteries. There is partially occlusive thrombus seen within the bifurcation of the segmental anterior and posterior right lower lobe subsegmental arterial branch. There is also partially occlusive thrombus seen within the of anterior left lower lobe subsegmental pulmonary arterial branches. No pulmonary embolism seen within the main central pulmonary  artery. There is mild cardiomegaly. No pericardial effusion or thickening. No evidence right heart strain. There is normal three-vessel brachiocephalic anatomy without proximal stenosis. Coronary artery calcifications are seen. Scattered mild aortic atherosclerosis is noted. Mediastinum/Nodes: No hilar, mediastinal, or axillary adenopathy. Thyroid gland, trachea, and esophagus demonstrate no significant findings. Lungs/Pleura: Multifocal patchy airspace opacities are seen throughout both lungs, predominantly within the periphery and at the lower lungs. No pleural effusions are seen. Upper Abdomen: No acute abnormalities present in the visualized portions of the upper abdomen. Musculoskeletal: No chest wall abnormality. No acute or significant osseous findings. Review of the MIP images confirms the above findings. IMPRESSION: 1. Partially occlusive thrombus within the bifurcation of the segmental anterior posterior right lower lobe pulmonary arteries as well as within the subsegmental anterior left lower lobe  pulmonary arterial. 2. No evidence of right ventricular heart strain. 3. Multifocal patchy airspace opacities throughout both lungs, likely consistent with multifocal pneumonia 4. Mild aortic Atherosclerosis (ICD10-I70.0). These results were called by telephone at the time of interpretation on 12/02/2019 at 8:30 pm to provider [facility] , who verbally acknowledged these results. Electronically Signed   By: Prudencio Pair M.D.   On: 12/02/2019 20:37   DG Chest Port 1 View  Result Date: 12/06/2019 CLINICAL DATA:  ARDS EXAM: PORTABLE CHEST 1 VIEW COMPARISON:  12/02/2019 FINDINGS: The cardiac silhouette, mediastinal and hilar contours are stable. Persistent basilar predominant infiltrates and atelectasis. No pleural effusion or pneumothorax. IMPRESSION: Persistent basilar predominant infiltrates and atelectasis. Electronically Signed   By: Marijo Sanes M.D.   On: 12/06/2019 08:14   DG Chest Port 1 View  Result Date: 12/02/2019 CLINICAL DATA:  Shortness of breath EXAM: PORTABLE CHEST 1 VIEW COMPARISON:  Radiograph 04/19/2013 FINDINGS: Coarse interstitial and patchy airspace opacities present in the bilateral lung bases with some more streaky atelectatic changes. Prominence of the cardiomediastinal silhouette may be portable technique related compared to prior PA radiography. No visible pneumothorax. Suspect trace right effusion. No left effusion. No acute osseous or soft tissue abnormality. Degenerative changes are present in the imaged spine and shoulders. Telemetry leads overlie the chest. Pacer pads overlie the chest wall. IMPRESSION: Coarse interstitial and patchy airspace opacities in the bilateral lung bases with some more streaky atelectatic changes. Findings could reflect infection, aspiration or edema. Electronically Signed   By: Lovena Le M.D.   On: 12/02/2019 16:53   ECHOCARDIOGRAM COMPLETE  Result Date: 12/03/2019    ECHOCARDIOGRAM REPORT   Patient Name:   Patrick Miranda Date of Exam:  12/03/2019 Medical Rec #:  [id number]          Height:       70.0 in Accession #:    [id number]         Weight:       160.7 lb Date of Birth:  31-Jul-1945          BSA:          1.902 m Patient Age:    63 years           BP:           139/65 mmHg Patient Gender: M                  HR:           53 bpm. Exam Location:  Inpatient Procedure: 2D Echo Indications:    Atrial Fibrillation  History:        Patient has no prior history of Echocardiogram examinations.  Arrythmias:Atrial Fibrillation.  Sonographer:    BW Referring Phys: [id number] [place]  1. Mid and basal inferior/posterior lateral wall hypokinesis . Left ventricular ejection fraction, by estimation, is 50 to 55%. The left ventricle has low normal function. The left ventricle demonstrates regional wall motion abnormalities (see scoring diagram/findings for description). The left ventricular internal cavity size was mildly dilated. Left ventricular diastolic parameters are indeterminate.  2. Right ventricular systolic function is normal. The right ventricular size is normal. There is mildly elevated pulmonary artery systolic pressure.  3. Right atrial size was mildly dilated.  4. The mitral valve is normal in structure. Mild mitral valve regurgitation. No evidence of mitral stenosis.  5. The aortic valve is tricuspid. Aortic valve regurgitation is mild. Mild aortic valve sclerosis is present, with no evidence of aortic valve stenosis.  6. The inferior vena cava is normal in size with greater than 50% respiratory variability, suggesting right atrial pressure of 3 mmHg. FINDINGS  Left Ventricle: Mid and basal inferior/posterior lateral wall hypokinesis. Left ventricular ejection fraction, by estimation, is 50 to 55%. The left ventricle has low normal function. The left ventricle demonstrates regional wall motion abnormalities. The left ventricular internal cavity size was mildly dilated. There is no left ventricular hypertrophy. Left  ventricular diastolic parameters are indeterminate. Right Ventricle: The right ventricular size is normal. No increase in right ventricular wall thickness. Right ventricular systolic function is normal. There is mildly elevated pulmonary artery systolic pressure. The tricuspid regurgitant velocity is 2.62  m/s, and with an assumed right atrial pressure of 10 mmHg, the estimated right ventricular systolic pressure is [id number] mmHg. Left Atrium: Left atrial size was normal in size. Right Atrium: Right atrial size was mildly dilated. Pericardium: There is no evidence of pericardial effusion. Mitral Valve: The mitral valve is normal in structure. There is mild thickening of the mitral valve leaflet(s). There is mild calcification of the mitral valve leaflet(s). Normal mobility of the mitral valve leaflets. Mild mitral valve regurgitation. No evidence of mitral valve stenosis. Tricuspid Valve: The tricuspid valve is normal in structure. Tricuspid valve regurgitation is mild . No evidence of tricuspid stenosis. Aortic Valve: The aortic valve is tricuspid. Aortic valve regurgitation is mild. Mild aortic valve sclerosis is present, with no evidence of aortic valve stenosis. Pulmonic Valve: The pulmonic valve was normal in structure. Pulmonic valve regurgitation is not visualized. No evidence of pulmonic stenosis. Aorta: The aortic root is normal in size and structure. Venous: The inferior vena cava is normal in size with greater than 50% respiratory variability, suggesting right atrial pressure of 3 mmHg. IAS/Shunts: No atrial level shunt detected by color flow Doppler.  LEFT VENTRICLE PLAX 2D LVIDd:         5.60 cm      Diastology LVIDs:         3.90 cm      LV e' lateral:   8.05 cm/s LV PW:         1.10 cm      LV E/e' lateral: 9.0 LV IVS:        1.10 cm      LV e' medial:    7.83 cm/s LVOT diam:     1.80 cm      LV E/e' medial:  9.3 LV SV:         63 LV SV Index:   33 LVOT Area:     2.54 cm  LV Volumes (MOD) LV vol d, MOD  A2C: 103.0  ml LV vol d, MOD A4C: 103.4 ml LV vol s, MOD A2C: 44.3 ml LV vol s, MOD A4C: 40.8 ml LV SV MOD A2C:     58.7 ml LV SV MOD A4C:     103.4 ml LV SV MOD BP:      62.1 ml RIGHT VENTRICLE RV S prime:     12.80 cm/s TAPSE (M-mode): 3.1 cm LEFT ATRIUM             Index       RIGHT ATRIUM           Index LA diam:        3.60 cm 1.89 cm/m  RA Area:     19.63 cm LA Vol (A2C):   59.1 ml 31.07 ml/m RA Volume:   57.43 ml  30.20 ml/m LA Vol (A4C):   43.0 ml 22.61 ml/m LA Biplane Vol: 52.1 ml 27.39 ml/m  AORTIC VALVE LVOT Vmax:   110.00 cm/s LVOT Vmean:  78.700 cm/s LVOT VTI:    0.246 m  AORTA Ao Root diam: 3.20 cm MITRAL VALVE               TRICUSPID VALVE MV Area (PHT): 3.53 cm    TR Peak grad:   27.5 mmHg MV Decel Time: 215 msec    TR Vmax:        262.00 cm/s MV E velocity: 72.70 cm/s MV A velocity: 87.40 cm/s  SHUNTS MV E/A ratio:  0.83        Systemic VTI:  0.25 m                            Systemic Diam: 1.80 cm Jenkins Rouge MD Electronically signed by Jenkins Rouge MD Signature Date/Time: 12/03/2019/5:17:01 PM    Final       Phillips Climes M.D on 12/08/2019 at 1:16 PM   [facility] -  Office  [phone]

## 2019-12-09 LAB — CBC
HCT: 35.1 % — ABNORMAL LOW (ref 39.0–52.0)
Hemoglobin: 11.3 g/dL — ABNORMAL LOW (ref 13.0–17.0)
MCH: 28.6 pg (ref 26.0–34.0)
MCHC: 32.2 g/dL (ref 30.0–36.0)
MCV: 88.9 fL (ref 80.0–100.0)
Platelets: 406 10*3/uL — ABNORMAL HIGH (ref 150–400)
RBC: 3.95 MIL/uL — ABNORMAL LOW (ref 4.22–5.81)
RDW: 14.6 % (ref 11.5–15.5)
WBC: 7.3 10*3/uL (ref 4.0–10.5)
nRBC: 0 % (ref 0.0–0.2)

## 2019-12-09 LAB — BASIC METABOLIC PANEL
Anion gap: 8 (ref 5–15)
BUN: 35 mg/dL — ABNORMAL HIGH (ref 8–23)
CO2: 22 mmol/L (ref 22–32)
Calcium: 8.6 mg/dL — ABNORMAL LOW (ref 8.9–10.3)
Chloride: 106 mmol/L (ref 98–111)
Creatinine, Ser: 1.15 mg/dL (ref 0.61–1.24)
GFR calc Af Amer: >60 mL/min (ref >60)
GFR calc non Af Amer: >60 mL/min (ref >60)
Glucose, Bld: 263 mg/dL — ABNORMAL HIGH (ref 70–99)
Potassium: 5.3 mmol/L — ABNORMAL HIGH (ref 3.5–5.1)
Sodium: 136 mmol/L (ref 135–145)

## 2019-12-09 LAB — GLUCOSE, CAPILLARY
Glucose-Capillary: 194 mg/dL — ABNORMAL HIGH (ref 70–99)
Glucose-Capillary: 130 mg/dL — ABNORMAL HIGH (ref 70–99)
Glucose-Capillary: 173 mg/dL — ABNORMAL HIGH (ref 70–99)
Glucose-Capillary: 175 mg/dL — ABNORMAL HIGH (ref 70–99)

## 2019-12-09 MED ORDER — APIXABAN 5 MG PO TABS: ORAL_TABLET | ORAL | 0 refills | Status: DC

## 2019-12-09 MED FILL — ELIQUIS 5 MG TABLET: 5 | 30 days supply | Qty: 64 | Fill #0

## 2019-12-09 NOTE — Plan of Care (Signed)

## 2019-12-09 NOTE — Progress Notes (Signed)
PROGRESS NOTE                                                                                                                                                                                                             Patient Demographics:    Patrick Miranda, is a 74 y.o. male, DOB - [birth year], [id number]  Admit date - 12/02/2019   Admitting Physician Cheryll Dessert, MD  Outpatient Primary MD for the patient is Burnard Bunting, MD  LOS - 7   Chief Complaint  Patient presents with  . Shortness of Breath       Brief Narrative    [name] is a 74 year old male with past medical history of CVA, type 2 diabetes, hyperlipidemia, hypertension, OSA compliant with CPAP who was exposed to Covid-19 around 5/1 then developed shortness of breath 3 days ago.  This progressively worsened and he developed central chest pain today so presented to the emergency department.  He has not received any Covid-19 vaccination.    In the ED, he was initially in atrial fibrillation with RVR with heart rate in the 140s and borderline low blood pressure.  Oxygen saturations 88%.  He was placed on nasal cannula oxygen and given IV metoprolol.  Chest x-ray showed patchy airspace opacities, CTA chest showed PE in the segmental right lower lobe PA and subsegmental left lower lobe PE without evidence of right heart strain.  Covid-19 results were positive along with elevated troponin 4,545, BNP 439, and lactic acid 4.5, WBC 14.3, glucose also significantly elevated >500 with bicarb of 15 and anion gap 19.  EKG without evidence of acute ischemia. Pt was started on a heparin gtt and insulin gtt .   Subjective:    Patrick Miranda today has, No headache, No chest pain, No abdominal pain - No Nausea, No new weakness tingling or numbness, reports his dyspnea is improving.   Assessment  & Plan :    Active Problems:   COVID-19 virus infection    Acute hypoxic respiratory  failure secondary to Covid-19 pneumonia and bilateral PE -Hypoxia on presentation, requiring 10 L high flow nasal cannula, he has improved today, hypoxia has been improving, requiring 2 L nasal cannula at rest, but quite hypoxic with activity saturating 83% on room air requiring up to 6 L oxygen. -He is on heparin GTT initially,  currently transitioned to Eliquis on 5/17  -Treated with IV remdesivir x5 days  -Continue with  steroids  -He was encouraged to use incentive spirometry, flutter valve, and to ambulate out of bed to chair. -Complete 7-day course of doxycycline for possible CAP  Chest Pain with elevated troponin -Secondary to demand ischemia - Echocardiogram with regional wall motion abnormality 5/15 - cardiology evaluation, no further recommendations at this time , continue home medication including Plavix.  New onset atrial fibrillation with RVR -Converted to sinus rhythm in the ED, with recurrent episodes of A. fib before stay,. -He is on Eliquis. -Currently back to normal sinus rhythm, on as needed metoprolol for heart rate control, did require amiodarone initially, which currently has been discontinued.  Type 2 diabetes with hyperglycemia/DKA on presentation -Insulin drip initiated in the ED - Hemoglobin A1c 9.4 - continoue Sliding-scale insulin and 6 units 3 times daily and Levemir 20 units nightly.  Hyperkalemia -Has improved after given Lokelma, and hold on potassium/Neutra-Phos supplements  COVID-19 Labs  No results for input(s): DDIMER, FERRITIN, LDH, CRP in the last 72 hours.  Lab Results  Component Value Date   [place] (A) 12/02/2019   [place] Not Detected 07/13/2019     Code Status : Full  Family Communication  : None  Disposition Plan  :  Status is: Inpatient  Remains inpatient appropriate because:Hemodynamically unstable   Dispo: The patient is from: Home              Anticipated d/c is to: Home              Anticipated d/c  date is: 2 days              Patient currently is not medically stable to d/c.         Barriers For Discharge :   Consults  :  PCCM  Procedures  : None  DVT Prophylaxis  :  Heparin GTT>> Eliquis  Lab Results  Component Value Date   PLT 406 (H) 12/09/2019    Antibiotics  :    Anti-infectives (From admission, onward)   Start     Dose/Rate Route Frequency Ordered Stop   12/05/19 1100  doxycycline (VIBRA-TABS) tablet 100 mg     100 mg Oral Every 12 hours 12/05/19 1046 12/10/19 0959   12/04/19 0400  doxycycline (VIBRAMYCIN) 100 mg in sodium chloride 0.9 % 250 mL IVPB  Status:  Discontinued     100 mg 125 mL/hr over 120 Minutes Intravenous Every 12 hours 12/03/19 1425 12/05/19 1046   12/03/19 2300  cefTRIAXone (ROCEPHIN) 2 g in sodium chloride 0.9 % 100 mL IVPB  Status:  Discontinued     2 g 200 mL/hr over 30 Minutes Intravenous Every 24 hours 12/03/19 1104 12/05/19 1046   12/03/19 1600  remdesivir 100 mg in sodium chloride 0.9 % 100 mL IVPB     100 mg 200 mL/hr over 30 Minutes Intravenous Daily 12/02/19 2146 12/06/19 1051   12/02/19 2300  cefTRIAXone (ROCEPHIN) 1 g in sodium chloride 0.9 % 100 mL IVPB  Status:  Discontinued     1 g 200 mL/hr over 30 Minutes Intravenous Every 24 hours 12/02/19 2259 12/03/19 1104   12/02/19 2300  azithromycin (ZITHROMAX) 500 mg in sodium chloride 0.9 % 250 mL IVPB  Status:  Discontinued     500 mg 250 mL/hr over 60 Minutes Intravenous Every 24 hours 12/02/19 2259 12/03/19 1425   12/02/19 2200  remdesivir 200 mg in  sodium chloride 0.9% 250 mL IVPB     200 mg 580 mL/hr over 30 Minutes Intravenous Once 12/02/19 2146 12/03/19 0036        Objective:   Vitals:   12/09/19 0005 12/09/19 0400 12/09/19 0839 12/09/19 1247  BP: 133/69 128/66 124/77 (!) 115/58  Pulse: 82 72 91 79  Resp: 20 20 (!) 23 20  Temp: 98.7 F (37.1 C) 98.6 F (37 C) 98.6 F (37 C) 98.9 F (37.2 C)  TempSrc: Oral Oral Oral Oral  SpO2: 90% 91% 92% 94%  Weight:        Height:        Wt Readings from Last 3 Encounters:  12/08/19 79.3 kg  07/12/18 81.6 kg  07/10/17 78.9 kg     Intake/Output Summary (Last 24 hours) at 12/09/2019 1433 Last data filed at 12/09/2019 1149 Gross per 24 hour  Intake 490 ml  Output 2725 ml  Net -2235 ml     Physical Exam  Awake Alert, Oriented X 3, No new F.N deficits, Normal affect Symmetrical Chest wall movement, Good air movement bilaterally, CTAB RRR,No Gallops,Rubs or new Murmurs, No Parasternal Heave +ve B.Sounds, Abd Soft, No tenderness, No rebound - guarding or rigidity. No Cyanosis, Clubbing or edema, No new Rash or bruise       Data Review:    CBC Recent Labs  Lab 12/02/19 1548 12/02/19 1604 12/05/19 0332 12/06/19 0320 12/07/19 0425 12/08/19 0500 12/09/19 0400  WBC 14.3*   < > 9.5 8.7 9.5 8.2 7.3  HGB 13.0   < > 10.9* 11.3* 11.9* 11.1* 11.3*  HCT 44.0   < > 34.7* 34.8* 37.8* 34.2* 35.1*  PLT 438*   < > 336 323 399 381 406*  MCV 95.4   < > 88.7 87.2 88.9 88.1 88.9  MCH 28.2   < > 27.9 28.3 28.0 28.6 28.6  MCHC 29.5*   < > 31.4 32.5 31.5 32.5 32.2  RDW 13.6   < > 13.6 13.5 14.1 14.2 14.6  LYMPHSABS 1.2  --   --   --   --   --   --   MONOABS 0.9  --   --   --   --   --   --   EOSABS 0.0  --   --   --   --   --   --   BASOSABS 0.0  --   --   --   --   --   --    < > = values in this interval not displayed.    Chemistries  Recent Labs  Lab 12/02/19 1548 12/02/19 1604 12/03/19 0250 12/03/19 0250 12/04/19 0310 12/04/19 0310 12/05/19 0332 12/06/19 0320 12/07/19 0425 12/08/19 0500 12/09/19 0400  NA 141   < > 147*   < > 141   < > 136 135 136 136 136  K 5.4*   < > 4.8   < > 4.7   < > 4.8 4.9 5.6* 5.1 5.3*  CL 108   < > 114*   < > 112*   < > 109 107 105 107 106  CO2 14*   < > 21*   < > 22   < > 20* 20* 22 23 22   GLUCOSE 556*   < > 150*   < > 144*   < > 274* 294* 227* 202* 263*  BUN 27*   < > 29*   < > 33*   < > 30*  27* 32* 31* 35*  CREATININE 1.72*   < > 1.03   < > 1.21   < > 1.15  1.10 1.19 1.11 1.15  CALCIUM 8.9   < > 8.6*   < > 8.2*   < > 8.0* 8.0* 8.5* 8.4* 8.6*  MG 2.1   < > 2.3  --  1.9  --  1.6* 1.8 2.0  --   --   AST 39  --   --   --   --   --   --   --   --  22  --   ALT 13  --   --   --   --   --   --   --   --  10  --   ALKPHOS 63  --   --   --   --   --   --   --   --  56  --   BILITOT 1.6*  --   --   --   --   --   --   --   --  0.5  --    < > = values in this interval not displayed.   ------------------------------------------------------------------------------------------------------------------ No results for input(s): CHOL, HDL, LDLCALC, TRIG, CHOLHDL, LDLDIRECT in the last 72 hours.  Lab Results  Component Value Date   HGBA1C 9.4 (H) 12/03/2019   ------------------------------------------------------------------------------------------------------------------ No results for input(s): TSH, T4TOTAL, T3FREE, THYROIDAB in the last 72 hours.  Invalid input(s): FREET3 ------------------------------------------------------------------------------------------------------------------ No results for input(s): VITAMINB12, FOLATE, FERRITIN, TIBC, IRON, RETICCTPCT in the last 72 hours.  Coagulation profile No results for input(s): INR, PROTIME in the last 168 hours.  No results for input(s): DDIMER in the last 72 hours.  Cardiac Enzymes No results for input(s): CKMB, TROPONINI, MYOGLOBIN in the last 168 hours.  Invalid input(s): CK ------------------------------------------------------------------------------------------------------------------    Component Value Date/Time   BNP 439.8 (H) 12/02/2019 1558    Inpatient Medications  Scheduled Meds: . apixaban  10 mg Oral BID   Followed by  . [START ON 12/12/2019] apixaban  5 mg Oral BID  . canagliflozin  100 mg Oral QAC breakfast  . Chlorhexidine Gluconate Cloth  6 each Topical Daily  . clopidogrel  75 mg Oral Q breakfast  . dexamethasone (DECADRON) injection  6 mg Intravenous Q24H  .  doxycycline  100 mg Oral Q12H  . feeding supplement (GLUCERNA SHAKE)  237 mL Oral TID BM  . hydrocortisone cream  1 application Topical TID  . insulin aspart  0-20 Units Subcutaneous TID WC  . insulin aspart  6 Units Subcutaneous TID WC  . insulin detemir  20 Units Subcutaneous QHS  . mouth rinse  15 mL Mouth Rinse BID  . metFORMIN  1,000 mg Oral BID WC  . pantoprazole  40 mg Oral Daily  . sodium chloride flush  10-40 mL Intracatheter Q12H   Continuous Infusions: . sodium chloride Stopped (12/05/19 0626)  . sodium chloride 10 mL/hr at 12/06/19 1200   PRN Meds:.sodium chloride, acetaminophen, alum & mag hydroxide-simeth, docusate sodium, guaiFENesin-dextromethorphan, hydrocortisone, lip balm, loratadine, Muscle Rub, phenol, pneumococcal 23 valent vaccine, polyethylene glycol, polyvinyl alcohol, sodium chloride, sodium chloride flush  Micro Results Recent Results (from the past 240 hour(s))  SARS Coronavirus 2 by RT PCR (hospital order, performed in [place] hospital lab) Nasopharyngeal Nasopharyngeal Swab     Status: Abnormal   Collection Time: 12/02/19  4:12 PM   Specimen: Nasopharyngeal Swab  Result Value Ref  Range Status   SARS Coronavirus 2 POSITIVE (A) NEGATIVE Final    Comment: RESULT CALLED TO, READ BACK BY AND VERIFIED WITH: L VENEGAS RN [id number] 12/02/19 A [place] (NOTE) SARS-CoV-2 target nucleic acids are DETECTED SARS-CoV-2 RNA is generally detectable in upper respiratory specimens  during the acute phase of infection.  Positive results are indicative  of the presence of the identified virus, but do not rule out bacterial infection or co-infection with other pathogens not detected by the test.  Clinical correlation with patient history and  other diagnostic information is necessary to determine patient infection status.  The expected result is negative. Fact Sheet for Patients:   StrictlyIdeas.no  Fact Sheet for Healthcare Providers:     BankingDealers.co.za   This test is not yet approved or cleared by the [place] FDA and  has been authorized for detection and/or diagnosis of SARS-CoV-2 by FDA under an Emergency Use Authorization (EUA).  This EUA will remain in effect (meaning this test can  be used) for the duration of  the COVID-19 declaration under Section 564(b)(1) of the Act, 21 U.S.C. section 360-bbb-3(b)(1), unless the authorization is terminated or revoked sooner. Performed at [facility], [place] [address] [place], [place] [address]   Urine culture     Status: Abnormal   Collection Time: 12/02/19  5:17 PM   Specimen: Urine, Random  Result Value Ref Range Status   Specimen Description URINE, RANDOM  Final   Special Requests NONE  Final   Culture (A)  Final    <10,000 COLONIES/mL INSIGNIFICANT GROWTH Performed at [facility], [place] [address] [place], [place] [address]    Report Status 12/03/2019 FINAL  Final  Culture, blood (routine x 2)     Status: None   Collection Time: 12/02/19 11:10 PM   Specimen: BLOOD  Result Value Ref Range Status   Specimen Description BLOOD RIGHT ANTECUBITAL  Final   Special Requests   Final    BOTTLES DRAWN AEROBIC AND ANAEROBIC Blood Culture adequate volume   Culture   Final    NO GROWTH 5 DAYS Performed at [facility], [place] [address] [place], [place] [address]    Report Status 12/07/2019 FINAL  Final  MRSA PCR Screening     Status: None   Collection Time: 12/03/19  2:08 AM   Specimen: Nasal Mucosa; Nasopharyngeal  Result Value Ref Range Status   MRSA by PCR NEGATIVE NEGATIVE Final    Comment:        The GeneXpert MRSA Assay (FDA approved for NASAL specimens only), is one component of a comprehensive MRSA colonization surveillance program. It is not intended to diagnose MRSA infection nor to guide or monitor treatment for MRSA infections. Performed at [facility], [place] [address] [place], [place] [address]    Culture, blood (routine x 2)     Status: None   Collection Time: 12/03/19  2:21 AM   Specimen: BLOOD LEFT HAND  Result Value Ref Range Status   Specimen Description BLOOD LEFT HAND  Final   Special Requests   Final    BOTTLES DRAWN AEROBIC AND ANAEROBIC Blood Culture adequate volume   Culture   Final    NO GROWTH 5 DAYS Performed at [facility], [place] [address] [place],  [address]    Report Status 12/08/2019 FINAL  Final    Radiology Reports CT Angio Chest PE W/Cm &/Or Wo Cm  Result Date: 12/02/2019 CLINICAL DATA:  Shortness of breath, radiograph shows possible pneumonia EXAM: CT ANGIOGRAPHY CHEST WITH CONTRAST TECHNIQUE: Multidetector CT imaging of the chest was performed using the standard protocol during bolus administration of intravenous contrast. Multiplanar CT image reconstructions and MIPs were obtained to evaluate the vascular anatomy. CONTRAST:  166mL OMNIPAQUE IOHEXOL 350 MG/ML SOLN COMPARISON:  None. FINDINGS: Cardiovascular: There is a optimal opacification of the pulmonary arteries. There is partially occlusive thrombus seen within the bifurcation of the segmental anterior and posterior right lower lobe subsegmental arterial branch. There is also partially occlusive thrombus seen within the of anterior left lower lobe subsegmental pulmonary arterial branches. No pulmonary embolism seen within the main central pulmonary artery. There is mild cardiomegaly. No pericardial effusion or thickening. No evidence right heart strain. There is normal three-vessel brachiocephalic anatomy without proximal stenosis. Coronary artery calcifications are seen. Scattered mild aortic atherosclerosis is noted. Mediastinum/Nodes: No hilar, mediastinal, or axillary adenopathy. Thyroid gland, trachea, and esophagus demonstrate no significant findings. Lungs/Pleura: Multifocal patchy airspace opacities are seen throughout both lungs, predominantly within the periphery and at the lower lungs. No  pleural effusions are seen. Upper Abdomen: No acute abnormalities present in the visualized portions of the upper abdomen. Musculoskeletal: No chest wall abnormality. No acute or significant osseous findings. Review of the MIP images confirms the above findings. IMPRESSION: 1. Partially occlusive thrombus within the bifurcation of the segmental anterior posterior right lower lobe pulmonary arteries as well as within the subsegmental anterior left lower lobe pulmonary arterial. 2. No evidence of right ventricular heart strain. 3. Multifocal patchy airspace opacities throughout both lungs, likely consistent with multifocal pneumonia 4. Mild aortic Atherosclerosis (ICD10-I70.0). These results were called by telephone at the time of interpretation on 12/02/2019 at 8:30 pm to provider [facility] , who verbally acknowledged these results. Electronically Signed   By: Prudencio Pair M.D.   On: 12/02/2019 20:37   DG Chest Port 1 View  Result Date: 12/06/2019 CLINICAL DATA:  ARDS EXAM: PORTABLE CHEST 1 VIEW COMPARISON:  12/02/2019 FINDINGS: The cardiac silhouette, mediastinal and hilar contours are stable. Persistent basilar predominant infiltrates and atelectasis. No pleural effusion or pneumothorax. IMPRESSION: Persistent basilar predominant infiltrates and atelectasis. Electronically Signed   By: Marijo Sanes M.D.   On: 12/06/2019 08:14   DG Chest Port 1 View  Result Date: 12/02/2019 CLINICAL DATA:  Shortness of breath EXAM: PORTABLE CHEST 1 VIEW COMPARISON:  Radiograph 04/19/2013 FINDINGS: Coarse interstitial and patchy airspace opacities present in the bilateral lung bases with some more streaky atelectatic changes. Prominence of the cardiomediastinal silhouette may be portable technique related compared to prior PA radiography. No visible pneumothorax. Suspect trace right effusion. No left effusion. No acute osseous or soft tissue abnormality. Degenerative changes are present in the imaged spine and shoulders.  Telemetry leads overlie the chest. Pacer pads overlie the chest wall. IMPRESSION: Coarse interstitial and patchy airspace opacities in the bilateral lung bases with some more streaky atelectatic changes. Findings could reflect infection, aspiration or edema. Electronically Signed   By: Lovena Le M.D.   On: 12/02/2019 16:53   ECHOCARDIOGRAM COMPLETE  Result Date: 12/03/2019    ECHOCARDIOGRAM REPORT   Patient Name:   CAMBRIDGE WHITFIELD Docter Date of Exam: 12/03/2019 Medical Rec #:  [id number]          Height:       70.0 in Accession #:    [id number]         Weight:       160.7 lb Date of Birth:  1946-06-20          BSA:          1.902 m Patient Age:    42 years           BP:           139/65 mmHg Patient Gender: M                  HR:           53 bpm. Exam Location:  Inpatient Procedure: 2D Echo Indications:    Atrial Fibrillation  History:        Patient has no prior history of Echocardiogram examinations.                 Arrythmias:Atrial Fibrillation.  Sonographer:    BW Referring Phys: [id number] [place]  1. Mid and basal inferior/posterior lateral wall hypokinesis . Left ventricular ejection fraction, by estimation, is 50 to 55%. The left ventricle has low normal function. The left ventricle demonstrates regional wall motion abnormalities (see scoring diagram/findings for description). The left ventricular internal cavity size was mildly dilated. Left ventricular diastolic parameters are indeterminate.  2. Right ventricular systolic function is normal. The right ventricular size is normal. There is mildly elevated pulmonary artery systolic pressure.  3. Right atrial size was mildly dilated.  4. The mitral valve is normal in structure. Mild mitral valve regurgitation. No evidence of mitral stenosis.  5. The aortic valve is tricuspid. Aortic valve regurgitation is mild. Mild aortic valve sclerosis is present, with no evidence of aortic valve stenosis.  6. The inferior vena cava is normal in size  with greater than 50% respiratory variability, suggesting right atrial pressure of 3 mmHg. FINDINGS  Left Ventricle: Mid and basal inferior/posterior lateral wall hypokinesis. Left ventricular ejection fraction, by estimation, is 50 to 55%. The left ventricle has low normal function. The left ventricle demonstrates regional wall motion abnormalities. The left ventricular internal cavity size was mildly dilated. There is no left ventricular hypertrophy. Left ventricular diastolic parameters are indeterminate. Right Ventricle: The right ventricular size is normal. No increase in right ventricular wall thickness. Right ventricular systolic function is normal. There is mildly elevated pulmonary artery systolic pressure. The tricuspid regurgitant velocity is 2.62  m/s, and with an assumed right atrial pressure of 10 mmHg, the estimated right ventricular systolic pressure is [id number] mmHg. Left Atrium: Left atrial size was normal in size. Right Atrium: Right atrial size was mildly dilated. Pericardium: There is no evidence of pericardial effusion. Mitral Valve: The mitral valve is normal in structure. There is mild thickening of the mitral valve leaflet(s). There is mild calcification of the mitral valve leaflet(s). Normal mobility of the mitral valve leaflets. Mild mitral valve regurgitation. No evidence of mitral valve stenosis. Tricuspid Valve: The tricuspid valve is normal in structure. Tricuspid valve regurgitation is mild . No evidence of tricuspid stenosis. Aortic Valve: The aortic valve is tricuspid. Aortic valve regurgitation is mild. Mild aortic valve sclerosis is present, with no evidence of aortic valve stenosis. Pulmonic Valve: The pulmonic valve was normal in structure. Pulmonic valve regurgitation is not visualized. No evidence of pulmonic stenosis. Aorta: The aortic root is normal in size and structure. Venous: The inferior vena cava is normal in size with greater than 50% respiratory variability, suggesting  right atrial pressure of 3 mmHg. IAS/Shunts: No atrial level shunt detected by color flow Doppler.  LEFT VENTRICLE PLAX 2D LVIDd:  5.60 cm      Diastology LVIDs:         3.90 cm      LV e' lateral:   8.05 cm/s LV PW:         1.10 cm      LV E/e' lateral: 9.0 LV IVS:        1.10 cm      LV e' medial:    7.83 cm/s LVOT diam:     1.80 cm      LV E/e' medial:  9.3 LV SV:         63 LV SV Index:   33 LVOT Area:     2.54 cm  LV Volumes (MOD) LV vol d, MOD A2C: 103.0 ml LV vol d, MOD A4C: 103.4 ml LV vol s, MOD A2C: 44.3 ml LV vol s, MOD A4C: 40.8 ml LV SV MOD A2C:     58.7 ml LV SV MOD A4C:     103.4 ml LV SV MOD BP:      62.1 ml RIGHT VENTRICLE RV S prime:     12.80 cm/s TAPSE (M-mode): 3.1 cm LEFT ATRIUM             Index       RIGHT ATRIUM           Index LA diam:        3.60 cm 1.89 cm/m  RA Area:     19.63 cm LA Vol (A2C):   59.1 ml 31.07 ml/m RA Volume:   57.43 ml  30.20 ml/m LA Vol (A4C):   43.0 ml 22.61 ml/m LA Biplane Vol: 52.1 ml 27.39 ml/m  AORTIC VALVE LVOT Vmax:   110.00 cm/s LVOT Vmean:  78.700 cm/s LVOT VTI:    0.246 m  AORTA Ao Root diam: 3.20 cm MITRAL VALVE               TRICUSPID VALVE MV Area (PHT): 3.53 cm    TR Peak grad:   27.5 mmHg MV Decel Time: 215 msec    TR Vmax:        262.00 cm/s MV E velocity: 72.70 cm/s MV A velocity: 87.40 cm/s  SHUNTS MV E/A ratio:  0.83        Systemic VTI:  0.25 m                            Systemic Diam: 1.80 cm Jenkins Rouge MD Electronically signed by Jenkins Rouge MD Signature Date/Time: 12/03/2019/5:17:01 PM    Final       Phillips Climes M.D on 12/09/2019 at 2:33 PM   [facility] -  Office  [phone]

## 2019-12-09 NOTE — Progress Notes (Signed)
Physical Therapy Treatment Patient Details Name: Patrick Miranda MRN: [id number] DOB: 1945-08-25 Today's Date: 12/09/2019    History of Present Illness Pt is a 74 year old man admitted with SOB, + COVID 19, B PEs. PMH: CVA, DM2, HTN, OSA on CPAP, arthritis, depression.    PT Comments    Pt was able to progress gait further down the hallway today with better stability/balance and less O2.  He is still unable to go on RA, but only required 2 L O2 [place] today.  He is looking forward to the possibility of going home soon.  PT will continue to follow acutely for safe mobility progression.     Follow Up Recommendations  Home health PT     Equipment Recommendations  Rolling walker with 5" wheels;Other (comment)(home O2)    Recommendations for Other Services   NA     Precautions / Restrictions Precautions Precautions: Fall;Other (comment) Precaution Comments: desat    Mobility  Bed Mobility               General bed mobility comments: Pt was OOB in the recliner chair.   Transfers Overall transfer level: Needs assistance Equipment used: None Transfers: Sit to/from Stand Sit to Stand: Supervision         General transfer comment: supervision for safety and line management.   Ambulation/Gait Ambulation/Gait assistance: Min guard Gait Distance (Feet): 200 Feet Assistive device: None Gait Pattern/deviations: Step-through pattern;Staggering right;Staggering left Gait velocity: decreased Gait velocity interpretation: 1.31 - 2.62 ft/sec, indicative of limited community ambulator General Gait Details: Pt with staggering gait pattern, admitting that he staggers at baseline.  He did much better today with improved gait distance and O2 tolerance.  He was still not able to walk on RA dropping to 84% but only required 2 L O2 [place] to maintain sats 88% or higher.           Balance Overall balance assessment: Needs assistance Sitting-balance support: Feet supported;No upper  extremity supported Sitting balance-Leahy Scale: Good     Standing balance support: Bilateral upper extremity supported;No upper extremity supported;Single extremity supported Standing balance-Leahy Scale: Fair Standing balance comment: needs the occasional support for balance when challenged.                             Cognition Arousal/Alertness: Awake/alert Behavior During Therapy: WFL for tasks assessed/performed Overall Cognitive Status: Within Functional Limits for tasks assessed                                           General Comments General comments (skin integrity, edema, etc.): Observed pt doing his incentive spirometer as I entered the room today.       Pertinent Vitals/Pain Pain Assessment: No/denies pain           PT Goals (current goals can now be found in the care plan section) Acute Rehab PT Goals Patient Stated Goal: return home Progress towards PT goals: Progressing toward goals    Frequency    Min 3X/week      PT Plan Current plan remains appropriate       AM-PAC PT "6 Clicks" Mobility   Outcome Measure  Help needed turning from your back to your side while in a flat bed without using bedrails?: A Little Help needed moving from lying on your back  to sitting on the side of a flat bed without using bedrails?: A Little Help needed moving to and from a bed to a chair (including a wheelchair)?: A Little Help needed standing up from a chair using your arms (e.g., wheelchair or bedside chair)?: None Help needed to walk in hospital room?: A Little Help needed climbing 3-5 steps with a railing? : A Little 6 Click Score: 19    End of Session Equipment Utilized During Treatment: Oxygen Activity Tolerance: Patient limited by fatigue Patient left: in chair;with call bell/phone within reach   PT Visit Diagnosis: Unsteadiness on feet (R26.81);Muscle weakness (generalized) (M62.81);Difficulty in walking, not elsewhere  classified (R26.2);Other (comment)     Time: [id number] PT Time Calculation (min) (ACUTE ONLY): 20 min  Charges:  $Gait Training: 8-22 mins                    Verdene Lennert, PT, DPT  Acute Rehabilitation [phone] pager #[phone] office     12/09/2019, 4:22 PM

## 2019-12-10 LAB — BASIC METABOLIC PANEL
Anion gap: 10 (ref 5–15)
BUN: 40 mg/dL — ABNORMAL HIGH (ref 8–23)
CO2: 23 mmol/L (ref 22–32)
Calcium: 9.1 mg/dL (ref 8.9–10.3)
Chloride: 101 mmol/L (ref 98–111)
Creatinine, Ser: 0.85 mg/dL (ref 0.61–1.24)
GFR calc Af Amer: >60 mL/min (ref >60)
GFR calc non Af Amer: >60 mL/min (ref >60)
Glucose, Bld: 233 mg/dL — ABNORMAL HIGH (ref 70–99)
Potassium: 5.2 mmol/L — ABNORMAL HIGH (ref 3.5–5.1)
Sodium: 134 mmol/L — ABNORMAL LOW (ref 135–145)

## 2019-12-10 LAB — CBC
HCT: 39.5 % (ref 39.0–52.0)
Hemoglobin: 12.4 g/dL — ABNORMAL LOW (ref 13.0–17.0)
MCH: 28.6 pg (ref 26.0–34.0)
MCHC: 31.4 g/dL (ref 30.0–36.0)
MCV: 91 fL (ref 80.0–100.0)
Platelets: 474 10*3/uL — ABNORMAL HIGH (ref 150–400)
RBC: 4.34 MIL/uL (ref 4.22–5.81)
RDW: 14.6 % (ref 11.5–15.5)
WBC: 10.3 10*3/uL (ref 4.0–10.5)
nRBC: 0 % (ref 0.0–0.2)

## 2019-12-10 LAB — GLUCOSE, CAPILLARY
Glucose-Capillary: 173 mg/dL — ABNORMAL HIGH (ref 70–99)
Glucose-Capillary: 403 mg/dL — ABNORMAL HIGH (ref 70–99)
Glucose-Capillary: 157 mg/dL — ABNORMAL HIGH (ref 70–99)
Glucose-Capillary: 171 mg/dL — ABNORMAL HIGH (ref 70–99)
Glucose-Capillary: 181 mg/dL — ABNORMAL HIGH (ref 70–99)

## 2019-12-10 MED ORDER — SODIUM ZIRCONIUM CYCLOSILICATE 10 G PO PACK
10.0000 g | PACK | Freq: Two times a day (BID) | ORAL | Status: AC
  Administered 2019-12-10 (×2): 10 g via ORAL
  Filled 2019-12-10 (×2): qty 1

## 2019-12-10 NOTE — Progress Notes (Signed)
Patient refused CPAP. Patient states he does wear one at home but does not want to wear one here since they could not get it right last time. RT informed patient to call if he changes his mind.

## 2019-12-10 NOTE — Progress Notes (Signed)
SATURATION QUALIFICATIONS: (This note is used to comply with regulatory documentation for home oxygen)  Patient Saturations on Room Air at Rest = 9\7%  Patient Saturations on Room Air while Ambulating = 86%  Patient Saturations on 2 Liters of oxygen while Ambulating = 93%  Please briefly explain why patient needs home oxygen: Pt saturations dipped to 86-87% while ambulating. Placed on 2L [place] with O2 sats above 93%.

## 2019-12-10 NOTE — Progress Notes (Signed)
SATURATION QUALIFICATIONS: (This note is used to comply with regulatory documentation for home oxygen)  Patient Saturations on Room Air at Rest = 96%  Patient Saturations on Room Air while Ambulating = 90%  Patient Saturations on 1 Liters of oxygen while Ambulating = 93%  Please briefly explain why patient needs home oxygen: Pt ambulated 200 ft+ down hallway on RA maintaining saturations at 90%+. Had a moment of going down to 88-89% in the middle of walk, but saturations rose again the next moment. Placed on 1L [place] to finish out walk with saturations at 93-95% when pt had some c/o of SOB.Marland Kitchen

## 2019-12-10 NOTE — Progress Notes (Signed)
PROGRESS NOTE                                                                                                                                                                                                             Patient Demographics:    Patrick Miranda, is a 74 y.o. male, DOB - June 11, 1946, [id number]  Admit date - 12/02/2019   Admitting Physician Cheryll Dessert, MD  Outpatient Primary MD for the patient is Burnard Bunting, MD  LOS - 8   Chief Complaint  Patient presents with  . Shortness of Breath       Brief Narrative    Mr. Proffit is a 74 year old male with past medical history of CVA, type 2 diabetes, hyperlipidemia, hypertension, OSA compliant with CPAP who was exposed to Covid-19 around 5/1 then developed shortness of breath 3 days ago.  This progressively worsened and he developed central chest pain today so presented to the emergency department.  He has not received any Covid-19 vaccination.    In the ED, he was initially in atrial fibrillation with RVR with heart rate in the 140s and borderline low blood pressure.  Oxygen saturations 88%.  He was placed on nasal cannula oxygen and given IV metoprolol.  Chest x-ray showed patchy airspace opacities, CTA chest showed PE in the segmental right lower lobe PA and subsegmental left lower lobe PE without evidence of right heart strain.  Covid-19 results were positive along with elevated troponin 4,545, BNP 439, and lactic acid 4.5, WBC 14.3, glucose also significantly elevated >500 with bicarb of 15 and anion gap 19.  EKG without evidence of acute ischemia. Pt was started on a heparin gtt and insulin gtt .   Subjective:    [employer] today has, No headache, No chest pain, No abdominal pain - No Nausea, No new weakness tingling or numbness, reports his dyspnea is improving.   Assessment  & Plan :    Active Problems:   COVID-19 virus infection    Acute hypoxic respiratory  failure secondary to Covid-19 pneumonia and bilateral PE -Hypoxia on presentation, requiring 10 L high flow nasal cannula, has been gradually improving, morning he was on 2 L nasal cannula, but he was able to ambulate today only with 1 L with activity, he was hypoxic on from his PE. -He is on heparin GTT  initially, currently transitioned to Eliquis on 5/17  -Treated with IV remdesivir x5 days  -Continue with  steroids  -He was encouraged to use incentive spirometry, flutter valve, and to ambulate out of bed to chair. -Complete 7-day course of doxycycline for possible CAP  Chest Pain with elevated troponin -Secondary to demand ischemia - Echocardiogram with regional wall motion abnormality 5/15 - cardiology evaluation, no further recommendations at this time , continue home medication including Plavix.  New onset atrial fibrillation with RVR -Converted to sinus rhythm in the ED, with recurrent episodes of A. fib initially. -He is on Eliquis. -Currently back to normal sinus rhythm, on as needed metoprolol for heart rate control, did require amiodarone initially, which currently has been discontinued.  Type 2 diabetes with hyperglycemia/DKA on presentation -Insulin drip initiated in the ED - Hemoglobin A1c 9.4 - continoue Sliding-scale insulin and 6 units 3 times daily and Levemir 20 units nightly.  She appears to be controlled on current regimen, he had a reading 403 this morning, but this appears to be an error as repeat one was at 173, no changes of current regimen will be done  Hyperkalemia -Has improved after given Lokelma, and hold on potassium/Neutra-Phos supplements potassium was 5.2 today, will give Lokelma x2.  COVID-19 Labs  No results for input(s): DDIMER, FERRITIN, LDH, CRP in the last 72 hours.  Lab Results  Component Value Date   [place] (A) 12/02/2019   [place] Not Detected 07/13/2019     Code Status : Full  Family Communication  :  None  Disposition Plan  :  Status is: Inpatient  Remains inpatient appropriate because:Hemodynamically unstable   Dispo: The patient is from: Home              Anticipated d/c is to: Home              Anticipated d/c date is: 2 days              Patient currently is not medically stable to d/c.         Barriers For Discharge :   Consults  :  PCCM  Procedures  : None  DVT Prophylaxis  :  Heparin GTT>> Eliquis  Lab Results  Component Value Date   PLT 474 (H) 12/10/2019    Antibiotics  :    Anti-infectives (From admission, onward)   Start     Dose/Rate Route Frequency Ordered Stop   12/05/19 1100  doxycycline (VIBRA-TABS) tablet 100 mg     100 mg Oral Every 12 hours 12/05/19 1046 12/09/19 2238   12/04/19 0400  doxycycline (VIBRAMYCIN) 100 mg in sodium chloride 0.9 % 250 mL IVPB  Status:  Discontinued     100 mg 125 mL/hr over 120 Minutes Intravenous Every 12 hours 12/03/19 1425 12/05/19 1046   12/03/19 2300  cefTRIAXone (ROCEPHIN) 2 g in sodium chloride 0.9 % 100 mL IVPB  Status:  Discontinued     2 g 200 mL/hr over 30 Minutes Intravenous Every 24 hours 12/03/19 1104 12/05/19 1046   12/03/19 1600  remdesivir 100 mg in sodium chloride 0.9 % 100 mL IVPB     100 mg 200 mL/hr over 30 Minutes Intravenous Daily 12/02/19 2146 12/06/19 1051   12/02/19 2300  cefTRIAXone (ROCEPHIN) 1 g in sodium chloride 0.9 % 100 mL IVPB  Status:  Discontinued     1 g 200 mL/hr over 30 Minutes Intravenous Every 24 hours 12/02/19 2259 12/03/19 1104   12/02/19 2300  azithromycin (ZITHROMAX) 500 mg in sodium chloride 0.9 % 250 mL IVPB  Status:  Discontinued     500 mg 250 mL/hr over 60 Minutes Intravenous Every 24 hours 12/02/19 2259 12/03/19 1425   12/02/19 2200  remdesivir 200 mg in sodium chloride 0.9% 250 mL IVPB     200 mg 580 mL/hr over 30 Minutes Intravenous Once 12/02/19 2146 12/03/19 0036        Objective:   Vitals:   12/09/19 2347 12/10/19 0000 12/10/19 0350 12/10/19 1123   BP: 138/79  (!) 146/78 (!) 136/57  Pulse: 86  81 75  Resp: 20  20 19   Temp: 98.7 F (37.1 C)  98.7 F (37.1 C) 97.6 F (36.4 C)  TempSrc: Oral Oral Oral Oral  SpO2: 93%  94% 96%  Weight:      Height:        Wt Readings from Last 3 Encounters:  12/08/19 79.3 kg  07/12/18 81.6 kg  07/10/17 78.9 kg     Intake/Output Summary (Last 24 hours) at 12/10/2019 1432 Last data filed at 12/10/2019 1123 Gross per 24 hour  Intake 480 ml  Output 1975 ml  Net -1495 ml     Physical Exam  Awake Alert, Oriented X 3, No new F.N deficits, Normal affect Symmetrical Chest wall movement, Good air movement bilaterally, CTAB RRR,No Gallops,Rubs or new Murmurs, No Parasternal Heave +ve B.Sounds, Abd Soft, No tenderness, No rebound - guarding or rigidity. No Cyanosis, Clubbing or edema, No new Rash or bruise       Data Review:    CBC Recent Labs  Lab 12/06/19 0320 12/07/19 0425 12/08/19 0500 12/09/19 0400 12/10/19 0400  WBC 8.7 9.5 8.2 7.3 10.3  HGB 11.3* 11.9* 11.1* 11.3* 12.4*  HCT 34.8* 37.8* 34.2* 35.1* 39.5  PLT 323 399 381 406* 474*  MCV 87.2 88.9 88.1 88.9 91.0  MCH 28.3 28.0 28.6 28.6 28.6  MCHC 32.5 31.5 32.5 32.2 31.4  RDW 13.5 14.1 14.2 14.6 14.6    Chemistries  Recent Labs  Lab 12/04/19 0310 12/04/19 0310 12/05/19 0332 12/05/19 0332 12/06/19 0320 12/07/19 0425 12/08/19 0500 12/09/19 0400 12/10/19 0400  NA 141   < > 136   < > 135 136 136 136 134*  K 4.7   < > 4.8   < > 4.9 5.6* 5.1 5.3* 5.2*  CL 112*   < > 109   < > 107 105 107 106 101  CO2 22   < > 20*   < > 20* 22 23 22 23   GLUCOSE 144*   < > 274*   < > 294* 227* 202* 263* 233*  BUN 33*   < > 30*   < > 27* 32* 31* 35* 40*  CREATININE 1.21   < > 1.15   < > 1.10 1.19 1.11 1.15 0.85  CALCIUM 8.2*   < > 8.0*   < > 8.0* 8.5* 8.4* 8.6* 9.1  MG 1.9  --  1.6*  --  1.8 2.0  --   --   --   AST  --   --   --   --   --   --  22  --   --   ALT  --   --   --   --   --   --  10  --   --   ALKPHOS  --   --   --   --    --   --  56  --   --   BILITOT  --   --   --   --   --   --  0.5  --   --    < > = values in this interval not displayed.   ------------------------------------------------------------------------------------------------------------------ No results for input(s): CHOL, HDL, LDLCALC, TRIG, CHOLHDL, LDLDIRECT in the last 72 hours.  Lab Results  Component Value Date   HGBA1C 9.4 (H) 12/03/2019   ------------------------------------------------------------------------------------------------------------------ No results for input(s): TSH, T4TOTAL, T3FREE, THYROIDAB in the last 72 hours.  Invalid input(s): FREET3 ------------------------------------------------------------------------------------------------------------------ No results for input(s): VITAMINB12, FOLATE, FERRITIN, TIBC, IRON, RETICCTPCT in the last 72 hours.  Coagulation profile No results for input(s): INR, PROTIME in the last 168 hours.  No results for input(s): DDIMER in the last 72 hours.  Cardiac Enzymes No results for input(s): CKMB, TROPONINI, MYOGLOBIN in the last 168 hours.  Invalid input(s): CK ------------------------------------------------------------------------------------------------------------------    Component Value Date/Time   BNP 439.8 (H) 12/02/2019 1558    Inpatient Medications  Scheduled Meds: . apixaban  10 mg Oral BID   Followed by  . [START ON 12/12/2019] apixaban  5 mg Oral BID  . Chlorhexidine Gluconate Cloth  6 each Topical Daily  . clopidogrel  75 mg Oral Q breakfast  . dexamethasone (DECADRON) injection  6 mg Intravenous Q24H  . feeding supplement (GLUCERNA SHAKE)  237 mL Oral TID BM  . hydrocortisone cream  1 application Topical TID  . insulin aspart  0-20 Units Subcutaneous TID WC  . insulin aspart  6 Units Subcutaneous TID WC  . insulin detemir  20 Units Subcutaneous QHS  . mouth rinse  15 mL Mouth Rinse BID  . metFORMIN  1,000 mg Oral BID WC  . pantoprazole  40 mg Oral  Daily  . sodium chloride flush  10-40 mL Intracatheter Q12H   Continuous Infusions: . sodium chloride Stopped (12/05/19 0626)  . sodium chloride 10 mL/hr at 12/06/19 1200   PRN Meds:.sodium chloride, acetaminophen, alum & mag hydroxide-simeth, docusate sodium, guaiFENesin-dextromethorphan, hydrocortisone, lip balm, loratadine, Muscle Rub, phenol, pneumococcal 23 valent vaccine, polyethylene glycol, polyvinyl alcohol, sodium chloride, sodium chloride flush  Micro Results Recent Results (from the past 240 hour(s))  SARS Coronavirus 2 by RT PCR (hospital order, performed in [place] hospital lab) Nasopharyngeal Nasopharyngeal Swab     Status: Abnormal   Collection Time: 12/02/19  4:12 PM   Specimen: Nasopharyngeal Swab  Result Value Ref Range Status   SARS Coronavirus 2 POSITIVE (A) NEGATIVE Final    Comment: RESULT CALLED TO, READ BACK BY AND VERIFIED WITH: L VENEGAS RN [id number] 12/02/19 A BROWNING (NOTE) SARS-CoV-2 target nucleic acids are DETECTED SARS-CoV-2 RNA is generally detectable in upper respiratory specimens  during the acute phase of infection.  Positive results are indicative  of the presence of the identified virus, but do not rule out bacterial infection or co-infection with other pathogens not detected by the test.  Clinical correlation with patient history and  other diagnostic information is necessary to determine patient infection status.  The expected result is negative. Fact Sheet for Patients:   StrictlyIdeas.no  Fact Sheet for Healthcare Providers:   BankingDealers.co.za   This test is not yet approved or cleared by the [place] FDA and  has been authorized for detection and/or diagnosis of SARS-CoV-2 by FDA under an Emergency Use Authorization (EUA).  This EUA will remain in effect (meaning this test can  be used) for the duration of  the COVID-19 declaration  under Section 564(b)(1) of the Act, 21 U.S.C. section  360-bbb-3(b)(1), unless the authorization is terminated or revoked sooner. Performed at [facility], [place] [address], [place], [place] [address]   Urine culture     Status: Abnormal   Collection Time: 12/02/19  5:17 PM   Specimen: Urine, Random  Result Value Ref Range Status   Specimen Description URINE, RANDOM  Final   Special Requests NONE  Final   Culture (A)  Final    <10,000 COLONIES/mL INSIGNIFICANT GROWTH Performed at [facility], [place] [address], [place], [place] [address]    Report Status 12/03/2019 FINAL  Final  Culture, blood (routine x 2)     Status: None   Collection Time: 12/02/19 11:10 PM   Specimen: BLOOD  Result Value Ref Range Status   Specimen Description BLOOD RIGHT ANTECUBITAL  Final   Special Requests   Final    BOTTLES DRAWN AEROBIC AND ANAEROBIC Blood Culture adequate volume   Culture   Final    NO GROWTH 5 DAYS Performed at [facility], [place] [address], [place], [place] [address]    Report Status 12/07/2019 FINAL  Final  MRSA PCR Screening     Status: None   Collection Time: 12/03/19  2:08 AM   Specimen: Nasal Mucosa; Nasopharyngeal  Result Value Ref Range Status   MRSA by PCR NEGATIVE NEGATIVE Final    Comment:        The GeneXpert MRSA Assay (FDA approved for NASAL specimens only), is one component of a comprehensive MRSA colonization surveillance program. It is not intended to diagnose MRSA infection nor to guide or monitor treatment for MRSA infections. Performed at [facility], [place] [address] [place], [place] [address]   Culture, blood (routine x 2)     Status: None   Collection Time: 12/03/19  2:21 AM   Specimen: BLOOD LEFT HAND  Result Value Ref Range Status   Specimen Description BLOOD LEFT HAND  Final   Special Requests   Final    BOTTLES DRAWN AEROBIC AND ANAEROBIC Blood Culture adequate volume   Culture   Final    NO GROWTH 5 DAYS Performed at [facility], [place] [address], [place], [place]  [address]    Report Status 12/08/2019 FINAL  Final    Radiology Reports CT Angio Chest PE W/Cm &/Or Wo Cm  Result Date: 12/02/2019 CLINICAL DATA:  Shortness of breath, radiograph shows possible pneumonia EXAM: CT ANGIOGRAPHY CHEST WITH CONTRAST TECHNIQUE: Multidetector CT imaging of the chest was performed using the standard protocol during bolus administration of intravenous contrast. Multiplanar CT image reconstructions and MIPs were obtained to evaluate the vascular anatomy. CONTRAST:  155mL OMNIPAQUE IOHEXOL 350 MG/ML SOLN COMPARISON:  None. FINDINGS: Cardiovascular: There is a optimal opacification of the pulmonary arteries. There is partially occlusive thrombus seen within the bifurcation of the segmental anterior and posterior right lower lobe subsegmental arterial branch. There is also partially occlusive thrombus seen within the of anterior left lower lobe subsegmental pulmonary arterial branches. No pulmonary embolism seen within the main central pulmonary artery. There is mild cardiomegaly. No pericardial effusion or thickening. No evidence right heart strain. There is normal three-vessel brachiocephalic anatomy without proximal stenosis. Coronary artery calcifications are seen. Scattered mild aortic atherosclerosis is noted. Mediastinum/Nodes: No hilar, mediastinal, or axillary adenopathy. Thyroid gland, trachea, and esophagus demonstrate no significant findings. Lungs/Pleura: Multifocal patchy airspace opacities are seen throughout both lungs, predominantly within the periphery and  at the lower lungs. No pleural effusions are seen. Upper Abdomen: No acute abnormalities present in the visualized portions of the upper abdomen. Musculoskeletal: No chest wall abnormality. No acute or significant osseous findings. Review of the MIP images confirms the above findings. IMPRESSION: 1. Partially occlusive thrombus within the bifurcation of the segmental anterior posterior right lower lobe pulmonary arteries  as well as within the subsegmental anterior left lower lobe pulmonary arterial. 2. No evidence of right ventricular heart strain. 3. Multifocal patchy airspace opacities throughout both lungs, likely consistent with multifocal pneumonia 4. Mild aortic Atherosclerosis (ICD10-I70.0). These results were called by telephone at the time of interpretation on 12/02/2019 at 8:30 pm to provider [facility] , who verbally acknowledged these results. Electronically Signed   By: Prudencio Pair M.D.   On: 12/02/2019 20:37   DG Chest Port 1 View  Result Date: 12/06/2019 CLINICAL DATA:  ARDS EXAM: PORTABLE CHEST 1 VIEW COMPARISON:  12/02/2019 FINDINGS: The cardiac silhouette, mediastinal and hilar contours are stable. Persistent basilar predominant infiltrates and atelectasis. No pleural effusion or pneumothorax. IMPRESSION: Persistent basilar predominant infiltrates and atelectasis. Electronically Signed   By: Marijo Sanes M.D.   On: 12/06/2019 08:14   DG Chest Port 1 View  Result Date: 12/02/2019 CLINICAL DATA:  Shortness of breath EXAM: PORTABLE CHEST 1 VIEW COMPARISON:  Radiograph 04/19/2013 FINDINGS: Coarse interstitial and patchy airspace opacities present in the bilateral lung bases with some more streaky atelectatic changes. Prominence of the cardiomediastinal silhouette may be portable technique related compared to prior PA radiography. No visible pneumothorax. Suspect trace right effusion. No left effusion. No acute osseous or soft tissue abnormality. Degenerative changes are present in the imaged spine and shoulders. Telemetry leads overlie the chest. Pacer pads overlie the chest wall. IMPRESSION: Coarse interstitial and patchy airspace opacities in the bilateral lung bases with some more streaky atelectatic changes. Findings could reflect infection, aspiration or edema. Electronically Signed   By: Lovena Le M.D.   On: 12/02/2019 16:53   ECHOCARDIOGRAM COMPLETE  Result Date: 12/03/2019    ECHOCARDIOGRAM  REPORT   Patient Name:   YIA HAYMOND Tinkey Date of Exam: 12/03/2019 Medical Rec #:  [id number]          Height:       70.0 in Accession #:    [id number]         Weight:       160.7 lb Date of Birth:  [birth year]          BSA:          1.902 m Patient Age:    70 years           BP:           139/65 mmHg Patient Gender: M                  HR:           53 bpm. Exam Location:  Inpatient Procedure: 2D Echo Indications:    Atrial Fibrillation  History:        Patient has no prior history of Echocardiogram examinations.                 Arrythmias:Atrial Fibrillation.  Sonographer:    BW Referring Phys: [id number] [place]  1. Mid and basal inferior/posterior lateral wall hypokinesis . Left ventricular ejection fraction, by estimation, is 50 to 55%. The left ventricle has low normal function. The left ventricle demonstrates regional wall motion abnormalities (  see scoring diagram/findings for description). The left ventricular internal cavity size was mildly dilated. Left ventricular diastolic parameters are indeterminate.  2. Right ventricular systolic function is normal. The right ventricular size is normal. There is mildly elevated pulmonary artery systolic pressure.  3. Right atrial size was mildly dilated.  4. The mitral valve is normal in structure. Mild mitral valve regurgitation. No evidence of mitral stenosis.  5. The aortic valve is tricuspid. Aortic valve regurgitation is mild. Mild aortic valve sclerosis is present, with no evidence of aortic valve stenosis.  6. The inferior vena cava is normal in size with greater than 50% respiratory variability, suggesting right atrial pressure of 3 mmHg. FINDINGS  Left Ventricle: Mid and basal inferior/posterior lateral wall hypokinesis. Left ventricular ejection fraction, by estimation, is 50 to 55%. The left ventricle has low normal function. The left ventricle demonstrates regional wall motion abnormalities. The left ventricular internal cavity size was  mildly dilated. There is no left ventricular hypertrophy. Left ventricular diastolic parameters are indeterminate. Right Ventricle: The right ventricular size is normal. No increase in right ventricular wall thickness. Right ventricular systolic function is normal. There is mildly elevated pulmonary artery systolic pressure. The tricuspid regurgitant velocity is 2.62  m/s, and with an assumed right atrial pressure of 10 mmHg, the estimated right ventricular systolic pressure is [id number] mmHg. Left Atrium: Left atrial size was normal in size. Right Atrium: Right atrial size was mildly dilated. Pericardium: There is no evidence of pericardial effusion. Mitral Valve: The mitral valve is normal in structure. There is mild thickening of the mitral valve leaflet(s). There is mild calcification of the mitral valve leaflet(s). Normal mobility of the mitral valve leaflets. Mild mitral valve regurgitation. No evidence of mitral valve stenosis. Tricuspid Valve: The tricuspid valve is normal in structure. Tricuspid valve regurgitation is mild . No evidence of tricuspid stenosis. Aortic Valve: The aortic valve is tricuspid. Aortic valve regurgitation is mild. Mild aortic valve sclerosis is present, with no evidence of aortic valve stenosis. Pulmonic Valve: The pulmonic valve was normal in structure. Pulmonic valve regurgitation is not visualized. No evidence of pulmonic stenosis. Aorta: The aortic root is normal in size and structure. Venous: The inferior vena cava is normal in size with greater than 50% respiratory variability, suggesting right atrial pressure of 3 mmHg. IAS/Shunts: No atrial level shunt detected by color flow Doppler.  LEFT VENTRICLE PLAX 2D LVIDd:         5.60 cm      Diastology LVIDs:         3.90 cm      LV e' lateral:   8.05 cm/s LV PW:         1.10 cm      LV E/e' lateral: 9.0 LV IVS:        1.10 cm      LV e' medial:    7.83 cm/s LVOT diam:     1.80 cm      LV E/e' medial:  9.3 LV SV:         63 LV SV Index:    33 LVOT Area:     2.54 cm  LV Volumes (MOD) LV vol d, MOD A2C: 103.0 ml LV vol d, MOD A4C: 103.4 ml LV vol s, MOD A2C: 44.3 ml LV vol s, MOD A4C: 40.8 ml LV SV MOD A2C:     58.7 ml LV SV MOD A4C:     103.4 ml LV SV MOD BP:  62.1 ml RIGHT VENTRICLE RV S prime:     12.80 cm/s TAPSE (M-mode): 3.1 cm LEFT ATRIUM             Index       RIGHT ATRIUM           Index LA diam:        3.60 cm 1.89 cm/m  RA Area:     19.63 cm LA Vol (A2C):   59.1 ml 31.07 ml/m RA Volume:   57.43 ml  30.20 ml/m LA Vol (A4C):   43.0 ml 22.61 ml/m LA Biplane Vol: 52.1 ml 27.39 ml/m  AORTIC VALVE LVOT Vmax:   110.00 cm/s LVOT Vmean:  78.700 cm/s LVOT VTI:    0.246 m  AORTA Ao Root diam: 3.20 cm MITRAL VALVE               TRICUSPID VALVE MV Area (PHT): 3.53 cm    TR Peak grad:   27.5 mmHg MV Decel Time: 215 msec    TR Vmax:        262.00 cm/s MV E velocity: 72.70 cm/s MV A velocity: 87.40 cm/s  SHUNTS MV E/A ratio:  0.83        Systemic VTI:  0.25 m                            Systemic Diam: 1.80 cm Jenkins Rouge MD Electronically signed by Jenkins Rouge MD Signature Date/Time: 12/03/2019/5:17:01 PM    Final       Phillips Climes M.D on 12/10/2019 at 2:32 PM   [facility] -  Office  [phone]

## 2019-12-11 LAB — CBC
HCT: 34.8 % — ABNORMAL LOW (ref 39.0–52.0)
Hemoglobin: 11.2 g/dL — ABNORMAL LOW (ref 13.0–17.0)
MCH: 28.6 pg (ref 26.0–34.0)
MCHC: 32.2 g/dL (ref 30.0–36.0)
MCV: 89 fL (ref 80.0–100.0)
Platelets: 415 10*3/uL — ABNORMAL HIGH (ref 150–400)
RBC: 3.91 MIL/uL — ABNORMAL LOW (ref 4.22–5.81)
RDW: 14.9 % (ref 11.5–15.5)
WBC: 6.7 10*3/uL (ref 4.0–10.5)
nRBC: 0 % (ref 0.0–0.2)

## 2019-12-11 LAB — BASIC METABOLIC PANEL
Anion gap: 8 (ref 5–15)
BUN: 35 mg/dL — ABNORMAL HIGH (ref 8–23)
CO2: 26 mmol/L (ref 22–32)
Calcium: 9 mg/dL (ref 8.9–10.3)
Chloride: 103 mmol/L (ref 98–111)
Creatinine, Ser: 1.13 mg/dL (ref 0.61–1.24)
GFR calc Af Amer: >60 mL/min (ref >60)
GFR calc non Af Amer: >60 mL/min (ref >60)
Glucose, Bld: 209 mg/dL — ABNORMAL HIGH (ref 70–99)
Potassium: 5.1 mmol/L (ref 3.5–5.1)
Sodium: 137 mmol/L (ref 135–145)

## 2019-12-11 LAB — GLUCOSE, CAPILLARY
Glucose-Capillary: 146 mg/dL — ABNORMAL HIGH (ref 70–99)
Glucose-Capillary: 155 mg/dL — ABNORMAL HIGH (ref 70–99)

## 2019-12-11 MED ORDER — PANTOPRAZOLE SODIUM 40 MG PO TBEC: 40.0000 mg | DELAYED_RELEASE_TABLET | Freq: Every day | ORAL | 0 refills | Status: DC

## 2019-12-11 NOTE — Progress Notes (Signed)
Paraje to be D/C'd Home per MD order.  Discussed with the patient and all questions fully answered.  VSS, Skin clean, dry and intact without evidence of skin break down, no evidence of skin tears noted. IV catheter discontinued intact. Site without signs and symptoms of complications. Dressing and pressure applied.  An After Visit Summary was printed and given to the patient. Patient received prescription.  D/c education completed with patient including follow up instructions, medication list, d/c activities limitations if indicated, with other d/c instructions as indicated by MD - patient able to verbalize understanding, all questions fully answered.   Patient instructed to return to ED, call 911, or call MD for any changes in condition.   Patient escorted via [place], and D/C home via private auto.  Jeanella Craze 12/11/2019 4:29 PM

## 2019-12-11 NOTE — Progress Notes (Signed)
Occupational Therapy Treatment Patient Details Name: Patrick Miranda MRN: [id number] DOB: Dec 08, 1945 Today's Date: 12/11/2019    History of present illness Pt is a 74 year old man admitted with SOB, + COVID 19, B PEs. PMH: CVA, DM2, HTN, OSA on CPAP, arthritis, depression.   OT comments  Patient  Motivated to work with therapy today.  He was able to complete long walk in hallway with min guard.  On 2L O2 throughout session and SpO2 range between 89-92 with mobility.  Patient also completed grooming at sink with supervision, BSC transfer with min guard, and seated dressing.  Patient independence is improving.  Would still benefit from work on balance and activity tolerance to increase safety with ADLs.   Follow Up Recommendations  Home health OT;Supervision/Assistance - 24 hour    Equipment Recommendations  3 in 1 bedside commode    Recommendations for Other Services      Precautions / Restrictions Precautions Precautions: Fall;Other (comment) Restrictions Weight Bearing Restrictions: No       Mobility Bed Mobility Overal bed mobility: Modified Independent Bed Mobility: Supine to Sit;Sit to Supine     Supine to sit: Modified independent (Device/Increase time) Sit to supine: Modified independent (Device/Increase time)      Transfers Overall transfer level: Needs assistance Equipment used: None Transfers: Sit to/from [facility] Sit to Stand: Supervision Stand pivot transfers: Min guard            Balance Overall balance assessment: Needs assistance Sitting-balance support: Feet supported;No upper extremity supported Sitting balance-Leahy Scale: Good Sitting balance - Comments: Bend forward and don/doff socks at EOB   Standing balance support: No upper extremity supported Standing balance-Leahy Scale: Fair Standing balance comment: Had 2 minor losses of balance while walking.  More than he did not pull foot all the way through when stepping and  paused mid stride.                           ADL either performed or assessed with clinical judgement   ADL Overall ADL's : Needs assistance/impaired     Grooming: Wash/dry hands;Wash/dry face;Oral care;Standing           Upper Body Dressing : Supervision/safety;Sitting   Lower Body Dressing: Supervision/safety;Sitting/lateral leans   Toilet Transfer: Min guard   Toileting- Clothing Manipulation and Hygiene: Moderate assistance;Sit to/from stand       Functional mobility during ADLs: Min guard       Vision       Perception     Praxis      Cognition Arousal/Alertness: Awake/alert Behavior During Therapy: WFL for tasks assessed/performed Overall Cognitive Status: Within Functional Limits for tasks assessed                                          Exercises Exercises: Other exercises Other Exercises Other Exercises: Long walk - 600 ft   Shoulder Instructions       General Comments      Pertinent Vitals/ Pain       Pain Assessment: No/denies pain  Home Living                                          Prior Functioning/Environment  Frequency  Min 2X/week        Progress Toward Goals  OT Goals(current goals can now be found in the care plan section)  Progress towards OT goals: Progressing toward goals  Acute Rehab OT Goals Patient Stated Goal: return home OT Goal Formulation: With patient Time For Goal Achievement: 12/19/19 Potential to Achieve Goals: Good  Plan Discharge plan remains appropriate    Co-evaluation                 AM-PAC OT "6 Clicks" Daily Activity     Outcome Measure   Help from another person eating meals?: None Help from another person taking care of personal grooming?: A Little Help from another person toileting, which includes using toliet, bedpan, or urinal?: A Lot Help from another person bathing (including washing, rinsing, drying)?: A  Little Help from another person to put on and taking off regular upper body clothing?: A Little Help from another person to put on and taking off regular lower body clothing?: A Little 6 Click Score: 18    End of Session Equipment Utilized During Treatment: Oxygen  OT Visit Diagnosis: Unsteadiness on feet (R26.81);Other abnormalities of gait and mobility (R26.89)   Activity Tolerance Patient tolerated treatment well   Patient Left with call bell/phone within reach;in chair   Nurse Communication Mobility status        Time: [phone] OT Time Calculation (min): 42 min  Charges: OT General Charges $OT Visit: 1 Visit OT Treatments $Self Care/Home Management : 23-37 mins $Therapeutic Activity: 8-22 mins  August Luz, OTR/L    Phylliss Bob 12/11/2019, 12:57 PM

## 2019-12-11 NOTE — Discharge Summary (Signed)
Patrick Miranda, is a 74 y.o. male  DOB 08-Feb-1946  MRN [id number].  Admission date:  12/02/2019  Admitting Physician  Patrick Dessert, MD  Discharge Date:  12/11/2019   Primary MD  Patrick Bunting, MD  Recommendations for primary care physician for things to follow:  -Please check CBC, CMP during next visit.   Admission Diagnosis  New onset a-fib (Patrick Miranda) [I48.91] NSTEMI (non-ST elevated myocardial infarction) (Patrick Miranda) [I21.4] Multiple subsegmental pulmonary emboli without acute cor pulmonale (HCC) [I26.94] COVID-19 virus infection [U07.1] Pneumonia due to COVID-19 virus [U07.1, J12.82]   Discharge Diagnosis  New onset a-fib (Patrick Miranda) [I48.91] NSTEMI (non-ST elevated myocardial infarction) (Patrick Miranda) [I21.4] Multiple subsegmental pulmonary emboli without acute cor pulmonale (HCC) [I26.94] COVID-19 virus infection [U07.1] Pneumonia due to COVID-19 virus [U07.1, J12.82]   Active Problems:   COVID-19 virus infection      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   affected patients speech   Cerebral artery disease    Depression    Diabetes mellitus    Hernia, umbilical    HOH (hard of hearing)    Hyperlipidemia    Hypertension    Sleep apnea    on cpap at home    Past Surgical History:  Procedure Laterality Date   COLONOSCOPY W/ POLYPECTOMY     TOTAL KNEE ARTHROPLASTY Left 04/22/2013   Procedure: TOTAL KNEE ARTHROPLASTY- left;  Surgeon: Patrick Schooling, MD;  Location: [facility];  Service: Orthopedics;  Laterality: Left;  with femoral block   TOTAL KNEE ARTHROPLASTY Right 01/06/2014   Procedure: RIGHT TOTAL KNEE ARTHROPLASTY;  Surgeon: Patrick Schooling, MD;  Location: [facility];  Service: Orthopedics;  Laterality: Right;       History of present illness and  Hospital Course:     Kindly see H&P for history of present illness and admission  details, please review complete Labs, Consult reports and Test reports for all details in brief  HPI  from the history and physical done on the day of admission 12/02/2019  Patrick Miranda is a 74 year old male with past medical history of CVA, type 2 diabetes, hyperlipidemia, hypertension, OSA compliant with CPAP who was exposed to Covid-19 around 5/1 then developed shortness of breath 3 days ago.  This progressively worsened and he developed central chest pain today so presented to the emergency department.  He has not received any Covid-19 vaccination.    In the ED, he was initially in atrial fibrillation with RVR with heart rate in the 140s and borderline low blood pressure.  Oxygen saturations 88%.  He was placed on nasal cannula oxygen and given IV metoprolol.  Chest x-ray showed patchy airspace opacities, CTA chest showed PE in the segmental right lower lobe PA and subsegmental left lower lobe PE without evidence of right heart strain.  Covid-19 results were positive along with elevated troponin 4,545, BNP 439, and lactic acid 4.5, WBC 14.3, glucose also significantly elevated >500 with bicarb of 15 and anion  gap 19.  EKG without evidence of acute ischemia. Pt was started on a heparin gtt and insulin gtt and PCCM consulted for admission.  Hospital Course    Acute hypoxic respiratory failure secondary to Covid-19 pneumonia and bilateral PE -Patient with significant hypoxia on presentation, requiring 10 L high flow nasal cannula, has been gradually improving, currently requiring 2 L nasal cannula mainly with activity, hypoxia is related to both COVID-19 and PE .  Will be discharged on home oxygen. -He is on heparin GTT initially, currently transitioned to Eliquis on 5/17  -Treated with IV remdesivir x5 days  -He received total of 10 days of steroids -He was encouraged use incentive spirometry and flutter valve and keep using them at home -Completed 7-day course of doxycycline for possible  CAP  Chest Pain with elevated troponin -Secondary to demand ischemia - Echocardiogram with regional wall motion abnormality 5/15 - cardiology evaluation, no further recommendations at this time , continue home medication including Plavix.  New onset atrial fibrillation with RVR -Converted to sinus rhythm in the ED, with recurrent episodes of A. fib initially. -He is on Eliquis. -Currently back to normal sinus rhythm,  did require amiodarone initially, which currently has been discontinued.  He will be discharged on his home dose Coreg.  Type 2 diabetes with hyperglycemia/DKA on presentation -Insulin drip initiated in the ED - Hemoglobin A1c 9.4 -He will be discharged on his home regimen.  Hyperkalemia -Has improved after given Lokelma, will hold losartan on discharge .  Hypertension  -Overall blood pressure has been acceptable, he is resumed on his home dose Coreg on discharge, but will discontinue losartan/hydrochlorothiazide on discharge given some recurrent hyperkalemia, as well want to drop his blood pressure too low as an outpatient.     Discharge Condition:  Stable   Follow UP  Follow-up Information    [facility].   Why: the office will call to schedule therapy visits at home Contact information: [place] [phone]            Discharge Instructions  and  Discharge Medications    Discharge Instructions    Discharge instructions   Complete by: As directed    Follow with Primary MD Patrick Bunting, MD in 10 days   Get CBC, CMP,  checked  by Primary MD next visit.    Activity: As tolerated with Full fall precautions use walker/cane & assistance as needed   Disposition Home   Diet: Heart Healthy ** , with feeding assistance and aspiration precautions.  For Heart failure patients - Check your Weight same time everyday, if you gain over 2 pounds, or you develop in leg swelling,  experience more shortness of breath or chest pain, call your Primary MD immediately. Follow Cardiac Low Salt Diet and 1.5 lit/day fluid restriction.   On your next visit with your primary care physician please Get Medicines reviewed and adjusted.   Please request your Prim.MD to go over all Hospital Tests and Procedure/Radiological results at the follow up, please get all Hospital records sent to your Prim MD by signing hospital release before you go home.   If you experience worsening of your admission symptoms, develop shortness of breath, life threatening emergency, suicidal or homicidal thoughts you must seek medical attention immediately by calling 911 or calling your MD immediately  if symptoms less severe.  You Must read complete instructions/literature along with all the possible adverse reactions/side effects for all the  Medicines you take and that have been prescribed to you. Take any new Medicines after you have completely understood and accpet all the possible adverse reactions/side effects.   Do not drive, operating heavy machinery, perform activities at heights, swimming or participation in water activities or provide baby sitting services if your were admitted for syncope or siezures until you have seen by Primary MD or a Neurologist and advised to do so again.  Do not drive when taking Pain medications.    Do not take more than prescribed Pain, Sleep and Anxiety Medications  Special Instructions: If you have smoked or chewed Tobacco  in the last 2 yrs please stop smoking, stop any regular Alcohol  and or any Recreational drug use.  Wear Seat belts while driving.   Please note  You were cared for by a hospitalist during your hospital stay. If you have any questions about your discharge medications or the care you received while you were in the hospital after you are discharged, you can call the unit and asked to speak with the hospitalist on call if the hospitalist that took  care of you is not available. Once you are discharged, your primary care physician will handle any further medical issues. Please note that NO REFILLS for any discharge medications will be authorized once you are discharged, as it is imperative that you return to your primary care physician (or establish a relationship with a primary care physician if you do not have one) for your aftercare needs so that they can reassess your need for medications and monitor your lab values.   Increase activity slowly   Complete by: As directed      Allergies as of 12/11/2019      Reactions   Lipitor [atorvastatin] Other (See Comments)   Muscle pain      Medication List    STOP taking these medications   losartan-hydrochlorothiazide 50-12.5 MG tablet Commonly known as: HYZAAR     TAKE these medications   apixaban 5 MG Tabs tablet Commonly known as: ELIQUIS Please take 10 mg oral twice daily, and on 5/24 transition to 5 mg oral twice daily.   BD Pen Needle Nano U/F 32G X 4 MM Misc Generic drug: Insulin Pen Needle 1 each by Other route daily.   carvedilol 12.5 MG tablet Commonly known as: COREG Take 12.5 mg by mouth 2 (two) times daily with a meal.   clopidogrel 75 MG tablet Commonly known as: PLAVIX Take 75 mg by mouth daily with breakfast.   fenofibrate 160 MG tablet Take 160 mg by mouth daily.   HumaLOG KwikPen 100 UNIT/ML KwikPen Generic drug: insulin lispro Inject 10 Units into the skin with breakfast, with lunch, and with evening meal.   ipratropium 0.03 % nasal spray Commonly known as: ATROVENT 1-2 sprays each nostril every 8-12 hours if needed   methylphenidate 5 MG tablet Commonly known as: Ritalin Take 1 tablet (5 mg total) by mouth 2 (two) times daily. If needed for sleepiness What changed:   when to take this  additional instructions   OneTouch Verio test strip Generic drug: glucose blood 1 each by Other route in the morning, at noon, and at bedtime.   pantoprazole  40 MG tablet Commonly known as: PROTONIX Take 1 tablet (40 mg total) by mouth daily. Start taking on: Dec 12, 2019   Synjardy 11-998 MG Tabs Generic drug: Empagliflozin-metFORMIN HCl Take 1 tablet by mouth 2 (two) times daily.   Toujeo SoloStar 300 UNIT/ML  Solostar Pen Generic drug: insulin glargine (1 Unit Dial) Inject 30 Units into the skin daily.            Durable Medical Equipment  (From admission, onward)         Start     Ordered   12/09/19 1436  For home use only DME oxygen  Once    Question Answer Comment  Length of Need 6 Months   Mode or (Route) Nasal cannula   Liters per Minute 2   Frequency Continuous (stationary and portable oxygen unit needed)   Oxygen conserving device Yes   Oxygen delivery system Gas      12/09/19 1436            Diet and Activity recommendation: See Discharge Instructions above   Consults obtained -  PCCM  Cardiology   Major procedures and Radiology Reports - PLEASE review detailed and final reports for all details, in brief -    CT Angio Chest PE W/Cm &/Or Wo Cm  Result Date: 12/02/2019 CLINICAL DATA:  Shortness of breath, radiograph shows possible pneumonia EXAM: CT ANGIOGRAPHY CHEST WITH CONTRAST TECHNIQUE: Multidetector CT imaging of the chest was performed using the standard protocol during bolus administration of intravenous contrast. Multiplanar CT image reconstructions and MIPs were obtained to evaluate the vascular anatomy. CONTRAST:  148mL OMNIPAQUE IOHEXOL 350 MG/ML SOLN COMPARISON:  None. FINDINGS: Cardiovascular: There is a optimal opacification of the pulmonary arteries. There is partially occlusive thrombus seen within the bifurcation of the segmental anterior and posterior right lower lobe subsegmental arterial branch. There is also partially occlusive thrombus seen within the of anterior left lower lobe subsegmental pulmonary arterial branches. No pulmonary embolism seen within the main central pulmonary artery.  There is mild cardiomegaly. No pericardial effusion or thickening. No evidence right heart strain. There is normal three-vessel brachiocephalic anatomy without proximal stenosis. Coronary artery calcifications are seen. Scattered mild aortic atherosclerosis is noted. Mediastinum/Nodes: No hilar, mediastinal, or axillary adenopathy. Thyroid gland, trachea, and esophagus demonstrate no significant findings. Lungs/Pleura: Multifocal patchy airspace opacities are seen throughout both lungs, predominantly within the periphery and at the lower lungs. No pleural effusions are seen. Upper Abdomen: No acute abnormalities present in the visualized portions of the upper abdomen. Musculoskeletal: No chest wall abnormality. No acute or significant osseous findings. Review of the MIP images confirms the above findings. IMPRESSION: 1. Partially occlusive thrombus within the bifurcation of the segmental anterior posterior right lower lobe pulmonary arteries as well as within the subsegmental anterior left lower lobe pulmonary arterial. 2. No evidence of right ventricular heart strain. 3. Multifocal patchy airspace opacities throughout both lungs, likely consistent with multifocal pneumonia 4. Mild aortic Atherosclerosis (ICD10-I70.0). These results were called by telephone at the time of interpretation on 12/02/2019 at 8:30 pm to provider [facility] , who verbally acknowledged these results. Electronically Signed   By: Prudencio Pair M.D.   On: 12/02/2019 20:37   DG Chest Port 1 View  Result Date: 12/06/2019 CLINICAL DATA:  ARDS EXAM: PORTABLE CHEST 1 VIEW COMPARISON:  12/02/2019 FINDINGS: The cardiac silhouette, mediastinal and hilar contours are stable. Persistent basilar predominant infiltrates and atelectasis. No pleural effusion or pneumothorax. IMPRESSION: Persistent basilar predominant infiltrates and atelectasis. Electronically Signed   By: Marijo Sanes M.D.   On: 12/06/2019 08:14   DG Chest Port 1 View  Result  Date: 12/02/2019 CLINICAL DATA:  Shortness of breath EXAM: PORTABLE CHEST 1 VIEW COMPARISON:  Radiograph 04/19/2013 FINDINGS: Coarse interstitial and patchy airspace  opacities present in the bilateral lung bases with some more streaky atelectatic changes. Prominence of the cardiomediastinal silhouette may be portable technique related compared to prior PA radiography. No visible pneumothorax. Suspect trace right effusion. No left effusion. No acute osseous or soft tissue abnormality. Degenerative changes are present in the imaged spine and shoulders. Telemetry leads overlie the chest. Pacer pads overlie the chest wall. IMPRESSION: Coarse interstitial and patchy airspace opacities in the bilateral lung bases with some more streaky atelectatic changes. Findings could reflect infection, aspiration or edema. Electronically Signed   By: Lovena Le M.D.   On: 12/02/2019 16:53   ECHOCARDIOGRAM COMPLETE  Result Date: 12/03/2019    ECHOCARDIOGRAM REPORT   Patient Name:   Patrick Miranda Date of Exam: 12/03/2019 Medical Rec #:  [id number]          Height:       70.0 in Accession #:    [id number]         Weight:       160.7 lb Date of Birth:  Apr 25, 1946          BSA:          1.902 m Patient Age:    23 years           BP:           139/65 mmHg Patient Gender: M                  HR:           53 bpm. Exam Location:  Inpatient Procedure: 2D Echo Indications:    Atrial Fibrillation  History:        Patient has no prior history of Echocardiogram examinations.                 Arrythmias:Atrial Fibrillation.  Sonographer:    BW Referring Phys: [id number] [place]  1. Mid and basal inferior/posterior lateral wall hypokinesis . Left ventricular ejection fraction, by estimation, is 50 to 55%. The left ventricle has low normal function. The left ventricle demonstrates regional wall motion abnormalities (see scoring diagram/findings for description). The left ventricular internal cavity size was mildly dilated.  Left ventricular diastolic parameters are indeterminate.  2. Right ventricular systolic function is normal. The right ventricular size is normal. There is mildly elevated pulmonary artery systolic pressure.  3. Right atrial size was mildly dilated.  4. The mitral valve is normal in structure. Mild mitral valve regurgitation. No evidence of mitral stenosis.  5. The aortic valve is tricuspid. Aortic valve regurgitation is mild. Mild aortic valve sclerosis is present, with no evidence of aortic valve stenosis.  6. The inferior vena cava is normal in size with greater than 50% respiratory variability, suggesting right atrial pressure of 3 mmHg. FINDINGS  Left Ventricle: Mid and basal inferior/posterior lateral wall hypokinesis. Left ventricular ejection fraction, by estimation, is 50 to 55%. The left ventricle has low normal function. The left ventricle demonstrates regional wall motion abnormalities. The left ventricular internal cavity size was mildly dilated. There is no left ventricular hypertrophy. Left ventricular diastolic parameters are indeterminate. Right Ventricle: The right ventricular size is normal. No increase in right ventricular wall thickness. Right ventricular systolic function is normal. There is mildly elevated pulmonary artery systolic pressure. The tricuspid regurgitant velocity is 2.62  m/s, and with an assumed right atrial pressure of 10 mmHg, the estimated right ventricular systolic pressure is [id number] mmHg. Left Atrium: Left atrial size was normal in  size. Right Atrium: Right atrial size was mildly dilated. Pericardium: There is no evidence of pericardial effusion. Mitral Valve: The mitral valve is normal in structure. There is mild thickening of the mitral valve leaflet(s). There is mild calcification of the mitral valve leaflet(s). Normal mobility of the mitral valve leaflets. Mild mitral valve regurgitation. No evidence of mitral valve stenosis. Tricuspid Valve: The tricuspid valve is normal  in structure. Tricuspid valve regurgitation is mild . No evidence of tricuspid stenosis. Aortic Valve: The aortic valve is tricuspid. Aortic valve regurgitation is mild. Mild aortic valve sclerosis is present, with no evidence of aortic valve stenosis. Pulmonic Valve: The pulmonic valve was normal in structure. Pulmonic valve regurgitation is not visualized. No evidence of pulmonic stenosis. Aorta: The aortic root is normal in size and structure. Venous: The inferior vena cava is normal in size with greater than 50% respiratory variability, suggesting right atrial pressure of 3 mmHg. IAS/Shunts: No atrial level shunt detected by color flow Doppler.  LEFT VENTRICLE PLAX 2D LVIDd:         5.60 cm      Diastology LVIDs:         3.90 cm      LV e' lateral:   8.05 cm/s LV PW:         1.10 cm      LV E/e' lateral: 9.0 LV IVS:        1.10 cm      LV e' medial:    7.83 cm/s LVOT diam:     1.80 cm      LV E/e' medial:  9.3 LV SV:         63 LV SV Index:   33 LVOT Area:     2.54 cm  LV Volumes (MOD) LV vol d, MOD A2C: 103.0 ml LV vol d, MOD A4C: 103.4 ml LV vol s, MOD A2C: 44.3 ml LV vol s, MOD A4C: 40.8 ml LV SV MOD A2C:     58.7 ml LV SV MOD A4C:     103.4 ml LV SV MOD BP:      62.1 ml RIGHT VENTRICLE RV S prime:     12.80 cm/s TAPSE (M-mode): 3.1 cm LEFT ATRIUM             Index       RIGHT ATRIUM           Index LA diam:        3.60 cm 1.89 cm/m  RA Area:     19.63 cm LA Vol (A2C):   59.1 ml 31.07 ml/m RA Volume:   57.43 ml  30.20 ml/m LA Vol (A4C):   43.0 ml 22.61 ml/m LA Biplane Vol: 52.1 ml 27.39 ml/m  AORTIC VALVE LVOT Vmax:   110.00 cm/s LVOT Vmean:  78.700 cm/s LVOT VTI:    0.246 m  AORTA Ao Root diam: 3.20 cm MITRAL VALVE               TRICUSPID VALVE MV Area (PHT): 3.53 cm    TR Peak grad:   27.5 mmHg MV Decel Time: 215 msec    TR Vmax:        262.00 cm/s MV E velocity: 72.70 cm/s MV A velocity: 87.40 cm/s  SHUNTS MV E/A ratio:  0.83        Systemic VTI:  0.25 m  Systemic Diam:  1.80 cm Jenkins Rouge MD Electronically signed by Jenkins Rouge MD Signature Date/Time: 12/03/2019/5:17:01 PM    Final     Micro Results    Recent Results (from the past 240 hour(s))  SARS Coronavirus 2 by RT PCR (hospital order, performed in [facility] hospital lab) Nasopharyngeal Nasopharyngeal Swab     Status: Abnormal   Collection Time: 12/02/19  4:12 PM   Specimen: Nasopharyngeal Swab  Result Value Ref Range Status   SARS Coronavirus 2 POSITIVE (A) NEGATIVE Final    Comment: RESULT CALLED TO, READ BACK BY AND VERIFIED WITH: Burgess Amor RN [id number] 12/02/19 A BROWNING (NOTE) SARS-CoV-2 target nucleic acids are DETECTED SARS-CoV-2 RNA is generally detectable in upper respiratory specimens  during the acute phase of infection.  Positive results are indicative  of the presence of the identified virus, but do not rule out bacterial infection or co-infection with other pathogens not detected by the test.  Clinical correlation with patient history and  other diagnostic information is necessary to determine patient infection status.  The expected result is negative. Fact Sheet for Patients:   StrictlyIdeas.no  Fact Sheet for Healthcare Providers:   BankingDealers.co.za   This test is not yet approved or cleared by the [place] FDA and  has been authorized for detection and/or diagnosis of SARS-CoV-2 by FDA under an Emergency Use Authorization (EUA).  This EUA will remain in effect (meaning this test can  be used) for the duration of  the COVID-19 declaration under Section 564(b)(1) of the Act, 21 U.S.C. section 360-bbb-3(b)(1), unless the authorization is terminated or revoked sooner. Performed at [facility], [place] [address], [place], [place] [address]   Urine culture     Status: Abnormal   Collection Time: 12/02/19  5:17 PM   Specimen: Urine, Random  Result Value Ref Range Status   Specimen Description URINE, RANDOM  Final    Special Requests NONE  Final   Culture (A)  Final    <10,000 COLONIES/mL INSIGNIFICANT GROWTH Performed at [facility], [place] [address], [place], [place] [address]    Report Status 12/03/2019 FINAL  Final  Culture, blood (routine x 2)     Status: None   Collection Time: 12/02/19 11:10 PM   Specimen: BLOOD  Result Value Ref Range Status   Specimen Description BLOOD RIGHT ANTECUBITAL  Final   Special Requests   Final    BOTTLES DRAWN AEROBIC AND ANAEROBIC Blood Culture adequate volume   Culture   Final    NO GROWTH 5 DAYS Performed at [facility], [place] [address], [place], [place] [address]    Report Status 12/07/2019 FINAL  Final  MRSA PCR Screening     Status: None   Collection Time: 12/03/19  2:08 AM   Specimen: Nasal Mucosa; Nasopharyngeal  Result Value Ref Range Status   MRSA by PCR NEGATIVE NEGATIVE Final    Comment:        The GeneXpert MRSA Assay (FDA approved for NASAL specimens only), is one component of a comprehensive MRSA colonization surveillance program. It is not intended to diagnose MRSA infection nor to guide or monitor treatment for MRSA infections. Performed at [facility], [place] [address], [place], [place] [address]   Culture, blood (routine x 2)     Status: None   Collection Time: 12/03/19  2:21 AM   Specimen: BLOOD LEFT HAND  Result Value Ref Range Status   Specimen Description BLOOD LEFT  HAND  Final   Special Requests   Final    BOTTLES DRAWN AEROBIC AND ANAEROBIC Blood Culture adequate volume   Culture   Final    NO GROWTH 5 DAYS Performed at [facility], [address] [place], [place] [address]    Report Status 12/08/2019 FINAL  Final       Today   Subjective:   [place] today has no headache,no chest or abdominal pain,no new weakness tingling or numbness, feels much better wants to go home today.   Objective:   Blood pressure 114/63, pulse 74, temperature 98.3 F (36.8 C), temperature source Oral,  resp. rate 15, height 5\' 10"  (1.778 m), weight 76.3 kg, SpO2 93 %.   Intake/Output Summary (Last 24 hours) at 12/11/2019 1320 Last data filed at 12/11/2019 0943 Gross per 24 hour  Intake 160 ml  Output 2170 ml  Net -2010 ml    Exam Awake Alert, Oriented x 3, No new F.N deficits, Normal affect Symmetrical Chest wall movement, Good air movement bilaterally, CTAB RRR,No Gallops,Rubs or new Murmurs, No Parasternal Heave +ve B.Sounds, Abd Soft, Non tender, No rebound -guarding or rigidity. No Cyanosis, Clubbing or edema, No new Rash or bruise  Data Review   CBC w Diff:  Lab Results  Component Value Date   WBC 6.7 12/11/2019   HGB 11.2 (L) 12/11/2019   HCT 34.8 (L) 12/11/2019   PLT 415 (H) 12/11/2019   LYMPHOPCT 8 12/02/2019   MONOPCT 6 12/02/2019   EOSPCT 0 12/02/2019   BASOPCT 0 12/02/2019    CMP:  Lab Results  Component Value Date   NA 137 12/11/2019   K 5.1 12/11/2019   CL 103 12/11/2019   CO2 26 12/11/2019   BUN 35 (H) 12/11/2019   CREATININE 1.13 12/11/2019   PROT 4.9 (L) 12/08/2019   ALBUMIN 1.9 (L) 12/08/2019   BILITOT 0.5 12/08/2019   ALKPHOS 56 12/08/2019   AST 22 12/08/2019   ALT 10 12/08/2019  .   Total Time in preparing paper work, data evaluation and todays exam - 57 minutes  Phillips Climes M.D on 12/11/2019 at 1:20 PM  [facility]   Office  [phone]

## 2019-12-11 NOTE — TOC Transition Note (Signed)
Transition of Care [facility]) - CM/SW Discharge Note   Patient Details  Name: Patrick Miranda MRN: [id number] Date of Birth: 1946-07-15  Transition of Care [facility]) CM/SW Contact:  Carles Collet, RN Phone Number: 12/11/2019, 1:33 PM   Clinical Narrative:   Damaris Schooner w patient, discussed DC plan. [facility] not open on Sunday, not able to use for DME.  Referral made to [employer] for home oxygen, They will deliver transport tank to the room and later today a concentrator to the home. [facility] notified of patient's DC today to start [facility] services in next 48 hours.  Patient states either his wife or daughter will transport home.     Final next level of care: [place] Barriers to Discharge: No Barriers Identified   Patient Goals and CMS Choice Patient states their goals for this hospitalization and ongoing recovery are:: to go home CMS Medicare.gov Compare Post Acute Care list provided to:: Patient Choice offered to / list presented to : Patient  Discharge Placement                       Discharge Plan and Services In-house Referral: NA Discharge Planning Services: CM Consult Post Acute Care Choice: Home Health, Durable Medical Equipment          DME Arranged: Oxygen DME Agency: [employer] Date DME Agency Contacted: 12/11/19 Time DME Agency Contacted: [phone] Representative spoke with at DME Agency: [employer] HH Arranged: PT, OT [place] Agency: [place] ([place]) Date [place]: 12/11/19 Time [place]: 1333 Representative spoke with at [place]: [place] ([place]) Interventions     Readmission Risk Interventions No flowsheet data found.

## 2019-12-12 ENCOUNTER — Ambulatory Visit: Payer: Self-pay | Admitting: Pharmacist

## 2019-12-12 ENCOUNTER — Other Ambulatory Visit: Payer: Self-pay

## 2019-12-12 DIAGNOSIS — I10 Essential (primary) hypertension: Secondary | ICD-10-CM | POA: Diagnosis not present

## 2019-12-12 DIAGNOSIS — U071 COVID-19: Secondary | ICD-10-CM | POA: Diagnosis not present

## 2019-12-12 DIAGNOSIS — G4733 Obstructive sleep apnea (adult) (pediatric): Secondary | ICD-10-CM | POA: Diagnosis not present

## 2019-12-12 DIAGNOSIS — I2694 Multiple subsegmental pulmonary emboli without acute cor pulmonale: Secondary | ICD-10-CM | POA: Diagnosis not present

## 2019-12-12 DIAGNOSIS — I480 Paroxysmal atrial fibrillation: Secondary | ICD-10-CM | POA: Diagnosis not present

## 2019-12-12 DIAGNOSIS — I69328 Other speech and language deficits following cerebral infarction: Secondary | ICD-10-CM | POA: Diagnosis not present

## 2019-12-12 DIAGNOSIS — E785 Hyperlipidemia, unspecified: Secondary | ICD-10-CM | POA: Diagnosis not present

## 2019-12-12 DIAGNOSIS — I214 Non-ST elevation (NSTEMI) myocardial infarction: Secondary | ICD-10-CM | POA: Diagnosis not present

## 2019-12-12 DIAGNOSIS — M1990 Unspecified osteoarthritis, unspecified site: Secondary | ICD-10-CM | POA: Diagnosis not present

## 2019-12-12 DIAGNOSIS — J9601 Acute respiratory failure with hypoxia: Secondary | ICD-10-CM | POA: Diagnosis not present

## 2019-12-12 DIAGNOSIS — J1282 Pneumonia due to coronavirus disease 2019: Secondary | ICD-10-CM | POA: Diagnosis not present

## 2019-12-12 DIAGNOSIS — E1165 Type 2 diabetes mellitus with hyperglycemia: Secondary | ICD-10-CM | POA: Diagnosis not present

## 2019-12-13 ENCOUNTER — Other Ambulatory Visit: Payer: Self-pay | Admitting: *Deleted

## 2019-12-13 ENCOUNTER — Encounter: Payer: Self-pay | Admitting: *Deleted

## 2019-12-13 NOTE — Patient Outreach (Signed)
[place] [facility]) Care Management THN CM Telephone Outreach, new referral PCP office completes Transition of Care follow up post-hospital discharge Post-hospital discharge day # 2  12/13/2019  [place] Feb 11, 1946 [id number]  Successful telephone outreach to male person identifying herself as spouse of [place], 74 y/o male referred to [place] yesterday by [facility] liaison after patient experienced recent hospitalization May 14-23, 2021 for shortness of breath with new-onset A-Fib with RVR and pneumonia/ pulmonary emboli/ acute respiratory failure with hypoxia secondary to COVID-19 infection.  Patient was discharged home with home oxygen and home health [facility].  Patient has history including, but not limited to, DM- TII; CKD- stage II; HTN/ HLD; OSA- uses HS CPAP; and CVA.  Prior to this hospitalization, patient has had no other recent hospital visits.  Purpose of call briefly discussed with patient's spouse, who states that patient is doing fine but is unable to talk to me, as he is just completing/ getting out of shower and needs her assistance; she mentions that patient has home health services in place and confirms that home health team visited with patient at home yesterday; she also confirms that he has had home O2 delivered.  Spouse requests that I call patient back on Thursday afternoon, as he has a scheduled PCP office visit tomorrow and will be busy.  Plan:  Will defer patient letter today, as patient was unable to talk with me to accept/ decline [facility] RN CM services  Will re-attempt outreach to patient on Thursday May 27, as requested today by patient's spouse  Oneta Rack, RN, BSN, [employer] Coordinator [facility] Care Management  [phone]

## 2019-12-14 DIAGNOSIS — E875 Hyperkalemia: Secondary | ICD-10-CM | POA: Diagnosis not present

## 2019-12-14 DIAGNOSIS — I4891 Unspecified atrial fibrillation: Secondary | ICD-10-CM | POA: Insufficient documentation

## 2019-12-14 DIAGNOSIS — E1129 Type 2 diabetes mellitus with other diabetic kidney complication: Secondary | ICD-10-CM | POA: Diagnosis not present

## 2019-12-14 DIAGNOSIS — I129 Hypertensive chronic kidney disease with stage 1 through stage 4 chronic kidney disease, or unspecified chronic kidney disease: Secondary | ICD-10-CM | POA: Diagnosis not present

## 2019-12-14 DIAGNOSIS — J1282 Pneumonia due to coronavirus disease 2019: Secondary | ICD-10-CM | POA: Diagnosis not present

## 2019-12-14 DIAGNOSIS — I482 Chronic atrial fibrillation, unspecified: Secondary | ICD-10-CM | POA: Insufficient documentation

## 2019-12-14 DIAGNOSIS — I2699 Other pulmonary embolism without acute cor pulmonale: Secondary | ICD-10-CM | POA: Diagnosis not present

## 2019-12-14 DIAGNOSIS — N1831 Chronic kidney disease, stage 3a: Secondary | ICD-10-CM | POA: Diagnosis not present

## 2019-12-15 ENCOUNTER — Other Ambulatory Visit: Payer: Self-pay | Admitting: *Deleted

## 2019-12-15 ENCOUNTER — Ambulatory Visit: Payer: Self-pay | Admitting: Pharmacist

## 2019-12-15 ENCOUNTER — Encounter: Payer: Self-pay | Admitting: *Deleted

## 2019-12-15 NOTE — Patient Outreach (Signed)
[place] [facility]) Care Management [place] PCP office completes Transition of Care follow up post-hospital discharge Post-hospital discharge day # 4 Unsuccessful (consecutive) outreach attempt # 2- new referral  12/15/2019  [place] 1946/03/09 [id number]  Successful telephone outreach to male person identifying herself as spouse of [place], 74 y/o male referred to [place] 12/12/19 by [facility] liaison after patient experienced recent hospitalization May 14-23, 2021 for shortness of breath with new-onset A-Fib with RVR and pneumonia/ pulmonary emboli/ acute respiratory failure with hypoxia secondary to COVID-19 infection.  Patient was discharged home with home oxygen and home health services [facility].  Patient has history including, but not limited to, DM- TII; CKD- stage II; HTN/ HLD; OSA- uses HS CPAP; and CVA.  Prior to this hospitalization, patient has had no other recent hospital visits.  Purpose of call was again briefly discussed with patient's spouse, who reports today that patient is unavailable to speak with me as he is currently with home health PT; she further states that she is unable to speak with me as well, as she is leaving her home now to take her daughter to the ED; spouse requests call back "next week."   Plan:  Will place [facility] CM unsuccessful patient outreach letter in mail requesting call back in writing  Will re-attempt THN CM telephone outreach within 4 business days (next week), as requested by patient's spouse, if I do not hear back from patient first  Oneta Rack, RN, BSN, [employer] Coordinator [facility] Care Management  [phone]

## 2019-12-20 ENCOUNTER — Encounter: Payer: Self-pay | Admitting: *Deleted

## 2019-12-20 ENCOUNTER — Other Ambulatory Visit: Payer: Self-pay | Admitting: *Deleted

## 2019-12-20 NOTE — Patient Outreach (Signed)
[place] [facility]) Care Management THN CM Telephone Outreach PCP completes Transition of Care follow up post-hospital discharge Post-hospital discharge day # 9   12/20/2019  [place] Nov 22, 1945 [id number]  Successful telephone outreach to male person identifying herself as spouse [name]) of [employer], 74 y/o male referred to [place] 12/12/19 by [facility] liaison after patient experienced recent hospitalization May 14-23, 2035for shortness of breath with new-onset A-Fib with RVR and pneumonia/ pulmonary emboli/ acute respiratory failure with hypoxia secondary to COVID-19 infection. Patient was discharged home with home oxygen and [facility] health [facility]. Patient has history including, but not limited to, DM- TII; CKD- stage II; HTN/ HLD; OSA- uses HS CPAP; and CVA.Prior to this hospitalization, patient has had no other recent hospital visits.  Purpose of call was again briefly discussed with patient's spouse, who reports today that patient is "doing okay;" HIPAA/ identity verified with spouse and I explained that I would like to speak with patient to obtain verbal consent for me to speak with her, as she is not noted to be on [facility] DPR- spouse provided patient phone and he too verified HIPAA and provided consent for me to speak with his wife [name] "any time;" stating that she "handles all of" his medical care.  Patient and his spouse [name] then complete the remainder of today's call with phone on speaker mode, with wife [name] as primary spokesperson.  Patient/ spouse provide verbal consent for [facility] CM services/ involvement in patient's care post- recent hospital discharge.  Patient reports "doing good, just fine" and his wife states that he is "doing fine;" however, wife immediately tells me that patient "had some kind of episode last night" that has now completely resolved; stated that she left for the grocery store and patient was  fine, but when she returned, he seemed to "have something different" with his speech; she acknowledges that patient has mild residual speech issues from a previous CVA, but stated that "something just seemed off," she further adds that she did not take patient to ED as he told her then "everything is okay."  Patient and spouse stated that when patient awoke this morning, "he was back to his normal self," without any "problems or changes."  Patient denies pain, new/ recent falls, and sounds to be in no distress throughout phone call today.  We discussed signs/ symptoms CVA and corresponding action plan and both verbalize a good general understanding of same.  Encouraged patient/ caregiver to immediately call EMS should they have concern for CVA in the future and they verbalize understanding.  Patient/ caregiver further report:  Medications: -- Has all medicationsand takes as prescribed;denies questions about current medications -- caregiver/ spouse verbalizes good general understanding of the purpose, dosing, and scheduling of medications   -- caregiver manages patient medications using weekly pill planner box; patient independent in administering/ managing insulin -- denies issues with swallowing medications -- patient was recently discharged from the hospital and all medications were thoroughly reviewed with caregiver today- caregiver reports that during our review, she has not been giving patient Eliquis twice daily stating she thought is was prescribed once a day; reports that she will now start providing to patient twice daily.  This was noted in medication list in EHR.  -- Caregiver reports that patient's newly prescribed eliquis is very expensive; discussed option of placing [facility] Pharmacist referral for patient assistance, however, upon further review, it appears [place] team is already involved- caregiver  confirms this as we talk; she also reports that patient occasionally has difficulty  paying for his insulin.  Encouraged caregiver to establish/ maintain communication with [facility] Pharmacist, as she reports that she "needs to call her back" to provide necessary information for patient assistance application -- [place] Coordination placed to [facility] Pharmacist to confirm that they are active in patient's care; received confirmation back from [place] that she continues to attempt outreach to patient  Home health [facility]) services: -- Home health services for PT in place with [place]; caregiver unsure if there are other disciplines, but states she will ask when PT arrives at their home for scheduled visit today -- confirms that they have contact information for home health team; patient's ongoing active participation in home health services was encouraged- patient/ caregiver verbalize agreement  Provider appointments: -- All upcoming provider appointments were reviewed with patient today; confirmed that patient attended PCP office visit post-hospital discharge; both report "went well;" and deny new/ recent changes to medications/ general plan of care -- confirmed that patient and caregiver have contact information for care providers and encouraged them to promptly contact care providers should any further clinical concerns/ issues/ problems arise, they verbalize understanding and agreement -- discussed reasons to contact care providers vs. seek emergent/ urgent care   Safety/ Mobility/ Falls: -- denies new/ recent falls; reports "has never had any falls" -- assistive devices: has cane walker at home, but "does not need to use;" reports "steady on feet" -- general fall risks/ prevention education discussed with patient today  Social/ Community Resource needs: -- currently denies community resource needs, stating supportive family members that assist with care needs as indicated; patient and wife also live with their daughter/ granddaughter at same  residence -- reports patient is independent in ADL's and requires only minimal assistance with iADL's post-hospital discharge; wife assists patient as indicated, when needed -- family provides transportation for patient to all provider appointments, errands, etc; hopeful that patient will be able to resume driving self "soon;" deny need for temporary transportation resources -- SDOH completed for: depression/ transportation/ food insecurity and no concerns were identified  Advanced Directive (AD) Planning:   --reports currently has exisisting Advanced Directives for living will and HCPOA in place and deny desire to make changes post-hospital discharge -- endorses self as "full or limited" code status, states "depends on circumstances;" verbalize good general understanding of basics of Advanced Directive planning and determination of cade status    Self-health management of recent corona virus infection with complications; new onset A-Fib; and DM: -- now on home O2; has been using prn at 1-2 L/min; also has CPAP- uses CPAP at night and home O2 during day time -- denies difficulty breathing, shortness of breath with activity; wife checks SaO2 levels at home and reports that they "are always between 95- 98%, with or without O2 on" -- basics of A-Fib discussed/ education provided, along with purpose of anticoagulation in setting of A-Fib -- monitors and records blood pressures daily; all reported as "120-130's/ 60-70's" -- blood sugars have been "running low;" patient monitors and records daily 3-4 times; reports both fasting and post-prandial blood sugars "all under 100;" fasting this morning reported at "55."  Patient has independently decreased his insulin in response and reports that he has managed his DM for several years; verbalizes good general understanding of signs/ symptoms hypoglycemia along with corresponding action plan; denies symptoms this morning with reported low value; reports he  re-checked his blood sugar after this morning's low value and it "finally came up to 127," post-prandial after eating breakfast -- verbalizes good general understanding of significance of A1-C with last value noted at "8.1 in March" --  Post-hospital discharge blood sugars reviewed: fasting values reported between "55 and 84" and post-prandial reported ranges between "58-109;" report that patient has established plan with PCP office to adjust insulins accordingly for low blood sugars; encouraged patient/ spouse to report should they continue to remain low after patient adjustments per established plan with PCP team -- follows diabetic diet and "learning" to follow low salt diet: discussed basics of low salt diet and well established strategies to decrease salt in diet; will send additional printed educational material around same  Patient/ caregiver deny further issues, concerns, or problems today.  I provided/ confirmed that patient has my direct phone number, the main [facility] CM office phone number, and the [facility] CM 24-hour nurse advice phone number should issues arise prior to next scheduled THN CM outreach.  Encouraged patient/ caregiver to contact me directly if needs, questions, issues, or concerns arise prior to next scheduled outreach; they agreed to do so.  Plan:  Patient will take medications as prescribed and will attend all scheduled provider appointments  Patient will promptly notify care providers for any new concerns/ issues/ problems that arise  Patient will actively participate in home health services as ordered post-hospital discharge  Patient will continue monitoring/ recording daily blood sugars 3- 4 times daily and will contact PCP should home values remain low   Patient/ caregiver will maintain contact with [place] team to explore options around patient assistance with Eliquis  I will place printed educational material in mail to patient around low salt diet, hypoglycemia, and  A-Fib  I will make patient's PCP aware of THN RN CM involvement in patient's care-- will send barriers letter  [facility] CM outreach to continue with scheduled phone call next week, for hopeful completion of THN CM initial assessment  THN CM Care Plan Problem One     Most Recent Value  Care Plan Problem One  High risk for hospital re-admission related to/ as evidenced by recent hospitalization for corona virus with complications  Role Documenting the Problem One  Care Management Coordinator  Care Plan for Problem One  Active  THN Long Term Goal   Over the nexy 31 days, patient will not experience hospital re-admissions as evidenced by patient and caregiver reporting and review of EHR during [facility] RN CM outreach  [facility] Long Term Goal Start Date  12/20/19  Interventions for Problem One Long Term Goal  Discussed with patient caregiver patient's current clinical condition,  confirmed that neither have current clinical concerns,  however, discussed recent episode of slurred speech yesterday and recent low blood sugars at home,  reviewed post-hospital discharge medications and general hospital discharge instructions with patient and caregiver,  initiated [facility] CM program,  provided education around signs/ symptoms CVA and corresponding action plan for same,  initiated THN CM initial assessment  THN CM Short Term Goal #1   Over the next 30 days, patient will continue actively participating in home health services as ordered post-hospital discharge, as evidenced by patient/ caregiver reporting during [facility] RN CM outreach  [facility] CM Short Term Goal #1 Start Date  12/20/19  Interventions for Short Term Goal #1  Confirmed that patient has been actively participating in home health services post-hospital discharge and that patient/ caregiver has contact information for home health  team- encouraged patient's active ongoing participation in all disciplines  THN CM Short Term Goal #2   Over the next 30 days, patient and caregiver  will continue monitoring and recording blood sugars at home 3- 4 times per day as evidenced by review of same during [place] outreach  [facility] CM Short Term Goal #2 Start Date  12/20/19  Interventions for Short Term Goal #2  Discussed with patient and caregiver their recent concerns with patient's low blood sugars at home,  reviewed blood sugars with both and confirmed that patient has adjusted his diabetes medications accordingly,  discussed signs/ symptoms low blood sugar with patient and caregiver and confirmed that both have a good baseline understanding of same, along with corresponding action plan for same     I appreciate the opportunity to participate in [place] care,  Oneta Rack, RN, BSN, [employer] Coordinator [facility] Care Management  [phone]

## 2019-12-26 ENCOUNTER — Encounter: Payer: Self-pay | Admitting: *Deleted

## 2019-12-26 ENCOUNTER — Other Ambulatory Visit: Payer: Self-pay | Admitting: *Deleted

## 2019-12-26 NOTE — Patient Outreach (Signed)
[facility]) Care Management [facility] CM Telephone Outreach PCP office completes Transition of Care follow up post-hospital discharge Post-hospital discharge day # 15  12/26/2019  [place] 11-17-1945 [id number]  Successful telephone outreach to spouse/ caregiver Patrick Miranda) of [employer], 74 y/o male referred to [facility] CM5/24/21by [facility] liaison after patient experienced recent hospitalization May 14-23, 2011for shortness of breath with new-onset A-Fib with RVR and pneumonia/ pulmonary emboli/ acute respiratory failure with hypoxia secondary to COVID-19 infection. Patient was discharged home with home oxygen and home health services through [facility]. Patient has history including, but not limited to, DM- TII; CKD- stage II; HTN/ HLD; OSA- uses HS CPAP; and CVA.Prior to this hospitalization, patient has had no other recent hospital visits.  HIPAA/ identity verified and caregiver reports patient "doing much better this week;" stating that he is not in pain and has had no recent or new falls; caregiver reports she can see "a big difference in overall condition," and adds that he is "slowly getting back to his normal self."  She denies clinical concerns and reports no further episodes of speech difficulty, as previously reported.  She states she has been reviewing previously mailed printed educational material- this information was reviewed with spouse during phone call today.  Caregiver further reports:  -- no concerns with medications; confirms that she has spoken to Patrick Miranda around possible financial assistance for Eliquis; encouraged caregiver to maintain communication with [facility] Miranda and to return necessary papers promptly; she verbalizes understanding and agreement ----  Previously reported adjustment of insulin "helping;" states patient blood sugars are no longer running consistently low post-hospital discharge; medication list  updated according to caregiver report today  -- home health services continue, "are going fine;" patient tolerating activity of PT sessions without problem  -- no recent provider appointments; encouraged to promptly notify care providers for any new concerns/ issues/ problems that arise  Self-health management of recent corona virus infection with complications; new onset A-Fib; and DM: -- continues using home O2 at 2 L/min; "having to use less than he did at first;" tolerating activity without shortness of breath outside of baseline; continues using CPAP at night; caregiver continues monitoring SaO2 levels at home, reports "they are always between 93-98%" with or without home O2 on -- no signs/ symptoms A-Fib; reviewed previously provided education with caregiver -- blood pressures at home "fine," caregiver denies significant increases/ decreases, states "they all seem very normal." -- blood sugars have been "much better;" patient continues monitoring/ recording daily 3-4 times; reports fasting blood sugars between 120-150 and post-prandial values between 150-170; has continued with previously reported insulin adjustment; denies signs/ symptoms hypo/ hyper-glycemia -- reports has started limited salt in diet; using teach back method, discussed with caregiver previously provided education around heart healthy diet   Caregiver puts patient on phone prior to end of call and he reports "doing much better," and sounds to be in no distress.  He denies needs/ concerns; patient/ caregiver both deny further issues, concerns, or problems today. I confirmed that that they havemy direct phone number, the main [facility] CM office phone number, and the [facility] CM 24-hour nurse advice phone number should issues arise prior to next scheduled [facility] CM outreach.  Encouraged patient/ caregiver to contact me directly if needs, questions, issues, or concerns arise prior to next scheduled outreach; they agreed to do  so.  Plan:  Patient will take medications as prescribed and will attend all scheduled provider appointments  Patient will promptly notify care providers for any new concerns/ issues/ problems that arise  Patient will actively participate in home health services as ordered post-hospital discharge  Patient will continue monitoring/ recording daily blood sugars 3- 4 times daily and will contact PCP should concerns arise  Patient/ caregiver will maintain contact with [name] to explore options around patient assistance with Eliquis  I will share today's notes/ care plan with patient's PCP as [name] CM initial assessment  [facility] CM outreach to continue with scheduled phone call next month  [name] CM Care Plan Problem One     Most Recent Value  Care Plan Problem One  High risk for hospital re-admission related to/ as evidenced by recent hospitalization for corona virus with complications  Role Documenting the Problem One  Care Management Coordinator  Care Plan for Problem One  Active  [facility]N Long Term Goal   Over the nexy 31 days, patient will not experience hospital re-admissions as evidenced by patient and caregiver reporting and review of EHR during [facility] RN CM outreach  [facility] Long Term Goal Start Date  12/20/19  Interventions for Problem One Long Term Goal  Discussed with patient, spouse/ caregiver patient's current clinical condition and confirmed that neither have any current clinical concerns,  confirmed that patient has had no further episodes of aphasia/ speech difficulty as previously reported,  completed [facility] CM initial assessment and shared with patient's PCPconfirmed that patient and spouse have contact information for care providers and understand to promptly notify care providers for any new concerns or problems that arise  [facility] CM Short Term Goal #1   Over the next 30 days, patient will continue actively participating in home health services as ordered post-hospital discharge, as  evidenced by patient/ caregiver reporting during [facility] RN CM outreach  [facility] CM Short Term Goal #1 Start Date  12/20/19  Interventions for Short Term Goal #1  Confirmed that patient continues actively participating in home health services,  positive reinforcement provided,  reviewed this week's visits with patient and caregiver  [facility] CM Short Term Goal #2   Over the next 30 days, patient and caregiver will continue monitoring and recording blood sugars at home 3- 4 times per day as evidenced by review of same during [facility] RN CM outreach  [facility] CM Short Term Goal #2 Start Date  12/20/19  Interventions for Short Term Goal #2  Confirmed that patient's blood sugars have improved,  reviewed recent blood sugars with caregiver and updated medication list around caregiver report of currnet dosing for insulin     Oneta Rack, RN, BSN, [employer] Coordinator [facility] Care Management  [phone]

## 2019-12-29 ENCOUNTER — Ambulatory Visit
Admission: EM | Admit: 2019-12-29 | Discharge: 2019-12-29 | Disposition: A | Discharge status: Home / Self Care | Payer: Medicare Other | Attending: Emergency Medicine | Admitting: Emergency Medicine

## 2019-12-29 ENCOUNTER — Other Ambulatory Visit: Payer: Self-pay

## 2019-12-29 DIAGNOSIS — R21 Rash and other nonspecific skin eruption: Secondary | ICD-10-CM

## 2019-12-29 DIAGNOSIS — W57XXXA Bitten or stung by nonvenomous insect and other nonvenomous arthropods, initial encounter: Secondary | ICD-10-CM | POA: Diagnosis not present

## 2019-12-29 MED ORDER — DOXYCYCLINE HYCLATE 100 MG PO CAPS: 200.0000 mg | ORAL_CAPSULE | Freq: Once | ORAL | 0 refills | Status: AC

## 2019-12-29 NOTE — ED Provider Notes (Signed)
[facility]    CSN: [id number] Arrival date & time: 12/29/19  1920      History   Chief Complaint Chief Complaint  Patient presents with   Insect Bite    HPI Patrick Miranda is a 74 y.o. male.   Who presents to the urgent care for complaint of tick bite that occurred today.  Denies a precipitating event, noticed while changing clothes.  Localizes the bite to left leg.  Has not tried removing tick.  Denies previous hx of tick bite.  Denies fever, chills, nausea, vomiting, headache, dizziness, weakness, fatigue, rash, or abdominal pain.   The history is provided by the patient. No language interpreter was used.    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   affected patients speech   Cerebral artery disease    Depression    Diabetes mellitus    Hernia, umbilical    HOH (hard of hearing)    Hyperlipidemia    Hypertension    Sleep apnea    on cpap at home    Patient Active Problem List   Diagnosis Date Noted   COVID-19 virus infection 12/02/2019   Multiple subsegmental pulmonary emboli without acute cor pulmonale ([place])    New onset a-fib (HCC)    NSTEMI (non-ST elevated myocardial infarction) ([place])    Hypersomnia 08/30/2019   History of adenomatous polyp of colon 05/06/2018   Vasomotor rhinitis 07/18/2017   Arthritis of knee, right 01/06/2014   Brainstem stroke ([place]) 09/07/2011    Class: Acute   Dysarthria 09/05/2011    Class: Acute   Chronic kidney disease (CKD), stage II (mild) 09/05/2011    Class: Chronic   Hypertension 09/05/2011    Class: Chronic   DM (diabetes mellitus), type 2 ([place]) 09/05/2011    Class: Chronic   Hyperlipidemia 09/05/2011    Class: Chronic   Obstructive sleep apnea 09/05/2011    Class: Chronic   Anemia 09/05/2011    Class: Chronic    Past Surgical History:  Procedure Laterality Date   COLONOSCOPY W/ POLYPECTOMY     TOTAL KNEE ARTHROPLASTY  Left 04/22/2013   Procedure: TOTAL KNEE ARTHROPLASTY- left;  Surgeon: Augustin Schooling, MD;  Location: [facility];  Service: Orthopedics;  Laterality: Left;  with femoral block   TOTAL KNEE ARTHROPLASTY Right 01/06/2014   Procedure: RIGHT TOTAL KNEE ARTHROPLASTY;  Surgeon: Augustin Schooling, MD;  Location: [facility];  Service: Orthopedics;  Laterality: Right;       Home Medications    Prior to Admission medications   Medication Sig Start Date End Date Taking? Authorizing Provider  apixaban (ELIQUIS) 5 MG TABS tablet Please take 10 mg oral twice daily, and on 5/24 transition to 5 mg oral twice daily. 12/09/19   Elgergawy, Silver Huguenin, MD  BD PEN NEEDLE NANO U/F 32G X 4 MM MISC 1 each by Other route daily.  03/19/18   [provider]  carvedilol (COREG) 12.5 MG tablet Take 12.5 mg by mouth 2 (two) times daily with a meal.    [provider]  clopidogrel (PLAVIX) 75 MG tablet Take 75 mg by mouth daily with breakfast.    [provider]  doxycycline (VIBRAMYCIN) 100 MG capsule Take 2 capsules (200 mg total) by mouth once for 1 dose. 12/29/19 12/29/19  Breeley Bischof, Darrelyn Hillock, FNP  Empagliflozin-Metformin HCl (SYNJARDY) 11-998 MG TABS Take 1 tablet by mouth 2 (two) times daily.  [provider]  fenofibrate 160 MG tablet Take 160 mg by mouth daily.    [provider]  HUMALOG KWIKPEN 100 UNIT/ML KwikPen Inject 10 Units into the skin with breakfast, with lunch, and with evening meal.  04/27/18   [provider]  ipratropium (ATROVENT) 0.03 % nasal spray 1-2 sprays each nostril every 8-12 hours if needed Patient not taking: Reported on 12/02/2019 07/10/17   Deneise Lever, MD  methylphenidate (RITALIN) 5 MG tablet Take 1 tablet (5 mg total) by mouth 2 (two) times daily. If needed for sleepiness Patient taking differently: Take 5 mg by mouth once a week. On sundays 07/13/19   Deneise Lever, MD  [facility] VERIO test strip 1 each by Other route in the morning, at noon,  and at bedtime.  03/20/18   [provider]  pantoprazole (PROTONIX) 40 MG tablet Take 1 tablet (40 mg total) by mouth daily. 12/12/19   Elgergawy, Silver Huguenin, MD  TOUJEO SOLOSTAR 300 UNIT/ML SOPN Inject 30 Units into the skin daily.  06/25/18   [provider]    Family History History reviewed. No pertinent family history.  Social History Social History   Tobacco Use   Smoking status: Never Smoker   Smokeless tobacco: Never Used  Substance Use Topics   Alcohol use: No   Drug use: No     Allergies   Lipitor [atorvastatin]   Review of Systems Review of Systems  Constitutional: Negative.   Respiratory: Negative.   Cardiovascular: Negative.   Skin: Positive for color change and rash.  All other systems reviewed and are negative.    Physical Exam Triage Vital Signs ED Triage Vitals  Enc Vitals Group     BP      Pulse      Resp      Temp      Temp src      SpO2      Weight      Height      Head Circumference      Peak Flow      Pain Score      Pain Loc      Pain Edu?      Excl. in [place]?    No data found.  Updated Vital Signs BP 123/72 (BP Location: Right Arm)    Pulse 79    Temp 98.1 F (36.7 C) (Oral)    Resp 16    SpO2 92%   Visual Acuity Right Eye Distance:   Left Eye Distance:   Bilateral Distance:    Right Eye Near:   Left Eye Near:    Bilateral Near:     Physical Exam Vitals and nursing note reviewed.  Constitutional:      General: He is not in acute distress.    Appearance: Normal appearance. He is normal weight. He is not ill-appearing, toxic-appearing or diaphoretic.  Cardiovascular:     Rate and Rhythm: Normal rate and regular rhythm.     Pulses: Normal pulses.     Heart sounds: Normal heart sounds. No murmur heard.  No friction rub. No gallop.   Pulmonary:     Effort: Pulmonary effort is normal. No respiratory distress.     Breath sounds: Normal breath sounds. No stridor. No wheezing, rhonchi or rales.  Chest:      Chest wall: No tenderness.  Skin:    General: Skin is warm.     Capillary Refill: Capillary refill takes less than 2 seconds.  Coloration: Skin is not pale.     Findings: Rash present. Rash is macular.  Neurological:     Mental Status: He is alert.      UC Treatments / Results  Labs (all labs ordered are listed, but only abnormal results are displayed) Labs Reviewed - No data to display  EKG   Radiology No results found.  Procedures Procedures (including critical care time)  Medications Ordered in UC Medications - No data to display  Initial Impression / Assessment and Plan / UC Course  I have reviewed the triage vital signs and the nursing notes.  Pertinent labs & imaging results that were available during my care of the patient were reviewed by me and considered in my medical decision making (see chart for details).     Patient is stable at discharge.  Single dose doxycycline was given as patient is within 72 hours of tick bite.  Was advised to return or to go to ED for worsening symptoms.  Final Clinical Impressions(s) / UC Diagnoses   Final diagnoses:  Tick bite, initial encounter  Rash in adult     Discharge Instructions     Prescribed single prophylactic dose of doxycycline 200 mg To prevent tick bites, wear long sleeves, long pants, and light colors. Use insect repellent. Follow the instructions on the bottle. If the tick is biting, do not try to remove it with heat, alcohol, petroleum jelly, or fingernail polish. Use tweezers, curved forceps, or a tick-removal tool to grasp the tick. Gently pull up until the tick lets go. Do not twist or jerk the tick. Do not squeeze or crush the tick. Return here or go to ER if you have any new or worsening symptoms (rash, nausea, vomiting, fever, chills, headache, fatigue)     ED Prescriptions    Medication Sig Dispense Auth. Provider   doxycycline (VIBRAMYCIN) 100 MG capsule Take 2 capsules (200 mg total) by  mouth once for 1 dose. 2 capsule Yulanda Diggs, Darrelyn Hillock, FNP     PDMP not reviewed this encounter.   Emerson Monte, FNP 12/29/19 1954

## 2019-12-29 NOTE — Discharge Instructions (Signed)
Prescribed single prophylactic dose of doxycycline 200 mg To prevent tick bites, wear long sleeves, long pants, and light colors. Use insect repellent. Follow the instructions on the bottle. If the tick is biting, do not try to remove it with heat, alcohol, petroleum jelly, or fingernail polish. Use tweezers, curved forceps, or a tick-removal tool to grasp the tick. Gently pull up until the tick lets go. Do not twist or jerk the tick. Do not squeeze or crush the tick. Return here or go to ER if you have any new or worsening symptoms (rash, nausea, vomiting, fever, chills, headache, fatigue 

## 2020-01-04 DIAGNOSIS — I10 Essential (primary) hypertension: Secondary | ICD-10-CM | POA: Diagnosis not present

## 2020-01-04 DIAGNOSIS — Z794 Long term (current) use of insulin: Secondary | ICD-10-CM | POA: Diagnosis not present

## 2020-01-04 DIAGNOSIS — N1831 Chronic kidney disease, stage 3a: Secondary | ICD-10-CM | POA: Diagnosis not present

## 2020-01-04 DIAGNOSIS — I4891 Unspecified atrial fibrillation: Secondary | ICD-10-CM | POA: Diagnosis not present

## 2020-01-04 DIAGNOSIS — E1151 Type 2 diabetes mellitus with diabetic peripheral angiopathy without gangrene: Secondary | ICD-10-CM | POA: Diagnosis not present

## 2020-01-11 DIAGNOSIS — Z7901 Long term (current) use of anticoagulants: Secondary | ICD-10-CM | POA: Diagnosis not present

## 2020-01-11 DIAGNOSIS — I2694 Multiple subsegmental pulmonary emboli without acute cor pulmonale: Secondary | ICD-10-CM | POA: Diagnosis not present

## 2020-01-11 DIAGNOSIS — E785 Hyperlipidemia, unspecified: Secondary | ICD-10-CM | POA: Diagnosis not present

## 2020-01-11 DIAGNOSIS — M1990 Unspecified osteoarthritis, unspecified site: Secondary | ICD-10-CM | POA: Diagnosis not present

## 2020-01-11 DIAGNOSIS — I69328 Other speech and language deficits following cerebral infarction: Secondary | ICD-10-CM | POA: Diagnosis not present

## 2020-01-11 DIAGNOSIS — K429 Umbilical hernia without obstruction or gangrene: Secondary | ICD-10-CM | POA: Diagnosis not present

## 2020-01-11 DIAGNOSIS — I10 Essential (primary) hypertension: Secondary | ICD-10-CM | POA: Diagnosis not present

## 2020-01-11 DIAGNOSIS — I480 Paroxysmal atrial fibrillation: Secondary | ICD-10-CM | POA: Diagnosis not present

## 2020-01-11 DIAGNOSIS — E1165 Type 2 diabetes mellitus with hyperglycemia: Secondary | ICD-10-CM | POA: Diagnosis not present

## 2020-01-11 DIAGNOSIS — F329 Major depressive disorder, single episode, unspecified: Secondary | ICD-10-CM | POA: Diagnosis not present

## 2020-01-11 DIAGNOSIS — I214 Non-ST elevation (NSTEMI) myocardial infarction: Secondary | ICD-10-CM | POA: Diagnosis not present

## 2020-01-11 DIAGNOSIS — U071 COVID-19: Secondary | ICD-10-CM | POA: Diagnosis not present

## 2020-01-11 DIAGNOSIS — Z9981 Dependence on supplemental oxygen: Secondary | ICD-10-CM | POA: Diagnosis not present

## 2020-01-11 DIAGNOSIS — Z7902 Long term (current) use of antithrombotics/antiplatelets: Secondary | ICD-10-CM | POA: Diagnosis not present

## 2020-01-11 DIAGNOSIS — G4733 Obstructive sleep apnea (adult) (pediatric): Secondary | ICD-10-CM | POA: Diagnosis not present

## 2020-01-11 DIAGNOSIS — Z794 Long term (current) use of insulin: Secondary | ICD-10-CM | POA: Diagnosis not present

## 2020-01-11 DIAGNOSIS — J1282 Pneumonia due to coronavirus disease 2019: Secondary | ICD-10-CM | POA: Diagnosis not present

## 2020-01-11 DIAGNOSIS — J9601 Acute respiratory failure with hypoxia: Secondary | ICD-10-CM | POA: Diagnosis not present

## 2020-01-16 DIAGNOSIS — J9601 Acute respiratory failure with hypoxia: Secondary | ICD-10-CM | POA: Diagnosis not present

## 2020-01-16 DIAGNOSIS — I2694 Multiple subsegmental pulmonary emboli without acute cor pulmonale: Secondary | ICD-10-CM | POA: Diagnosis not present

## 2020-01-16 DIAGNOSIS — U071 COVID-19: Secondary | ICD-10-CM | POA: Diagnosis not present

## 2020-01-16 DIAGNOSIS — I214 Non-ST elevation (NSTEMI) myocardial infarction: Secondary | ICD-10-CM | POA: Diagnosis not present

## 2020-01-16 DIAGNOSIS — J1282 Pneumonia due to coronavirus disease 2019: Secondary | ICD-10-CM | POA: Diagnosis not present

## 2020-01-16 DIAGNOSIS — I10 Essential (primary) hypertension: Secondary | ICD-10-CM | POA: Diagnosis not present

## 2020-01-17 ENCOUNTER — Ambulatory Visit (INDEPENDENT_AMBULATORY_CARE_PROVIDER_SITE_OTHER): Payer: Medicare Other | Admitting: Cardiology

## 2020-01-17 ENCOUNTER — Encounter: Payer: Self-pay | Admitting: Cardiology

## 2020-01-17 ENCOUNTER — Other Ambulatory Visit: Payer: Self-pay

## 2020-01-17 VITALS — BP 120/60 | HR 76 | Ht 70.0 in | Wt 168.8 lb

## 2020-01-17 DIAGNOSIS — I2510 Atherosclerotic heart disease of native coronary artery without angina pectoris: Secondary | ICD-10-CM

## 2020-01-17 DIAGNOSIS — Z79899 Other long term (current) drug therapy: Secondary | ICD-10-CM

## 2020-01-17 DIAGNOSIS — Z86711 Personal history of pulmonary embolism: Secondary | ICD-10-CM | POA: Diagnosis not present

## 2020-01-17 DIAGNOSIS — Z8679 Personal history of other diseases of the circulatory system: Secondary | ICD-10-CM

## 2020-01-17 DIAGNOSIS — I429 Cardiomyopathy, unspecified: Secondary | ICD-10-CM

## 2020-01-17 MED ORDER — APIXABAN 5 MG PO TABS: 5.0000 mg | ORAL_TABLET | Freq: Two times a day (BID) | ORAL | 11 refills | Status: DC

## 2020-01-17 MED ORDER — PANTOPRAZOLE SODIUM 40 MG PO TBEC: 40.0000 mg | DELAYED_RELEASE_TABLET | Freq: Every day | ORAL | 6 refills | Status: DC

## 2020-01-17 NOTE — Patient Instructions (Signed)
Medication Instructions:  Your physician recommends that you continue on your current medications as directed. Please refer to the Current Medication list given to you today.  *If you need a refill on your cardiac medications before your next appointment, please call your pharmacy*   Lab Work: CBC and bmet JUST BEFORE next visit  If you have labs (blood work) drawn today and your tests are completely normal, you will receive your results only by: Marland Kitchen MyChart Message (if you have MyChart) OR . A paper copy in the mail If you have any lab test that is abnormal or we need to change your treatment, we will call you to review the results.   Testing/Procedures: None today   Follow-Up: At [facility], you and your health needs are our priority.  As part of our continuing mission to provide you with exceptional heart care, we have created designated Provider Care Teams.  These Care Teams include your primary Cardiologist (physician) and Advanced Practice Providers (APPs -  Physician Assistants and Nurse Practitioners) who all work together to provide you with the care you need, when you need it.  We recommend signing up for the patient portal called "MyChart".  Sign up information is provided on this After Visit Summary.  MyChart is used to connect with patients for Virtual Visits (Telemedicine).  Patients are able to view lab/test results, encounter notes, upcoming appointments, etc.  Non-urgent messages can be sent to your provider as well.   To learn more about what you can do with MyChart, go to [email or web address].    Your next appointment:   2 month(s)  The format for your next appointment:   In Person  Provider:   Rozann Lesches, MD   Other Instructions None      Thank you for choosing [facility] !

## 2020-01-17 NOTE — Progress Notes (Signed)
Cardiology Office Note  Date: 01/17/2020   ID: BAUER AUSBORN, DOB 1945-11-16, MRN [id number]  PCP:  Burnard Bunting, MD  Cardiologist:  Rozann Lesches, MD Electrophysiologist:  None   Chief Complaint  Patient presents with  . History of atrial fibrillation    History of Present Illness: Patrick Miranda is a 74 y.o. male referred for cardiology consultation by Dr. Waldron Labs with history of atrial fibrillation and type 2 NSTEMI in the setting of multiple subsegmental pulmonary emboli with acute cor pulmonale and COVID-19 in May of this year.  I reviewed available records and updated the chart.  He is here today with his wife.  We discussed his hospitalization and findings of cardiac evaluation.  He does not recall very many details of his stay.  He does not report any palpitations or chest pain at this time.  His wife states that he has a family history of premature CAD and was having some generalized fatigue before all of this started.  Chest CTA that diagnosed as pulmonary emboli also mentioned coronary artery calcification.  He does not have any known history of obstructive CAD.  Echocardiogram from May 15 revealed LVEF 50 to 55% with mid to basal inferior/posterior hypokinesis.  RV function was described as normal.  I reviewed his medications which are listed below.  He is on Plavix which he was taking prior to the recent hospital stay with previous history of stroke.  He is now on Eliquis as well, also PPI.  He does not describe any bleeding problems.  His lab work in May is noted below.  I personally reviewed his ECG today which shows sinus rhythm with IVCD and repolarization abnormalities.  Past Medical History:  Diagnosis Date  . Arthritis   . 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 Cerebral artery disease   . COVID-07 Dec 2019  . Depression   . Essential hypertension   . Hernia, umbilical   . HOH (hard of hearing)   . Hyperlipidemia   . OSA on CPAP   .  Pulmonary emboli [facility])    May 2021  . Type 2 diabetes mellitus ([place])     Past Surgical History:  Procedure Laterality Date  . COLONOSCOPY W/ POLYPECTOMY    . TOTAL KNEE ARTHROPLASTY Left 04/22/2013   Procedure: TOTAL KNEE ARTHROPLASTY- left;  Surgeon: Augustin Schooling, MD;  Location: [facility];  Service: Orthopedics;  Laterality: Left;  with femoral block  . TOTAL KNEE ARTHROPLASTY Right 01/06/2014   Procedure: RIGHT TOTAL KNEE ARTHROPLASTY;  Surgeon: Augustin Schooling, MD;  Location: [facility];  Service: Orthopedics;  Laterality: Right;    Current Outpatient Medications  Medication Sig Dispense Refill  . apixaban (ELIQUIS) 5 MG TABS tablet Take 1 tablet (5 mg total) by mouth 2 (two) times daily. 60 tablet 11  . BD PEN NEEDLE NANO U/F 32G X 4 MM MISC 1 each by Other route daily.     . carvedilol (COREG) 12.5 MG tablet Take 12.5 mg by mouth 2 (two) times daily with a meal.    . clopidogrel (PLAVIX) 75 MG tablet Take 75 mg by mouth daily with breakfast.    . Empagliflozin-Metformin HCl (SYNJARDY) 11-998 MG TABS Take 1 tablet by mouth 2 (two) times daily.    . fenofibrate 160 MG tablet Take 160 mg by mouth daily.    Marland Kitchen HUMALOG KWIKPEN 100 UNIT/ML KwikPen Inject 10 Units into the skin with breakfast, with lunch, and with evening  meal.     . ipratropium (ATROVENT) 0.03 % nasal spray 1-2 sprays each nostril every 8-12 hours if needed 30 mL 12  . methylphenidate (RITALIN) 5 MG tablet Take 1 tablet (5 mg total) by mouth 2 (two) times daily. If needed for sleepiness 30 tablet 0  . ONETOUCH VERIO test strip 1 each by Other route in the morning, at noon, and at bedtime.   3  . pantoprazole (PROTONIX) 40 MG tablet Take 1 tablet (40 mg total) by mouth daily. 30 tablet 6  . TOUJEO SOLOSTAR 300 UNIT/ML SOPN Inject 17 Units into the skin daily.      No current facility-administered medications for this visit.   Allergies:  Lipitor [atorvastatin]   Social History: The patient  reports that he has never smoked. He  has never used smokeless tobacco. He reports that he does not drink alcohol and does not use drugs.   Family History: The patient's family history includes CAD in his brother.   ROS:   No recent palpitations, no syncope.  Physical Exam: VS:  BP 120/60   Pulse 76   Ht 5\' 10"  (1.778 m)   Wt 168 lb 12.8 oz (76.6 kg)   SpO2 97%   BMI 24.22 kg/m , BMI Body mass index is 24.22 kg/m.  Wt Readings from Last 3 Encounters:  01/17/20 168 lb 12.8 oz (76.6 kg)  12/11/19 168 lb 3.4 oz (76.3 kg)  07/12/18 179 lb 12.8 oz (81.6 kg)    General:  Elderly male, appears comfortable at rest. HEENT: Conjunctiva and lids normal, wearing a mask. Neck: Supple, no elevated JVP or carotid bruits, no thyromegaly. Lungs: Clear to auscultation, nonlabored breathing at rest. Cardiac: Regular rate and rhythm, no S3, soft systolic murmur, no pericardial rub. Abdomen: Soft, nontender, bowel sounds present. Extremities: No pitting edema, distal pulses 2+. Skin: Warm and dry. Musculoskeletal: No kyphosis. Neuropsychiatric: Alert and oriented x3, affect grossly appropriate.  ECG:  An ECG dated 12/02/2019 was personally reviewed today and demonstrated:  Rapid atrial fibrillation with diffuse ST-T wave abnormalities and IVCD, also PVCs.  Recent Labwork: 12/02/2019: B Natriuretic Peptide 439.8; TSH 1.228 12/07/2019: Magnesium 2.0 12/08/2019: ALT 10; AST 22 12/11/2019: BUN 35; Creatinine, Ser 1.13; Hemoglobin 11.2; Platelets 415; Potassium 5.1; Sodium 137     Component Value Date/Time   TRIG 211 (H) 12/02/2019 1548    Other Studies Reviewed Today:  Echocardiogram 12/03/2019: 1. Mid and basal inferior/posterior lateral wall hypokinesis . Left  ventricular ejection fraction, by estimation, is 50 to 55%. The left  ventricle has low normal function. The left ventricle demonstrates  regional wall motion abnormalities (see scoring  diagram/findings for description). The left ventricular internal cavity  size was  mildly dilated. Left ventricular diastolic parameters are  indeterminate.  2. Right ventricular systolic function is normal. The right ventricular  size is normal. There is mildly elevated pulmonary artery systolic  pressure.  3. Right atrial size was mildly dilated.  4. The mitral valve is normal in structure. Mild mitral valve  regurgitation. No evidence of mitral stenosis.  5. The aortic valve is tricuspid. Aortic valve regurgitation is mild.  Mild aortic valve sclerosis is present, with no evidence of aortic valve  stenosis.  6. The inferior vena cava is normal in size with greater than 50%  respiratory variability, suggesting right atrial pressure of 3 mmHg.   Assessment and Plan:  1.  Transient atrial fibrillation with RVR noted in the setting of COVID-19 pneumonia with hypoxic respiratory  failure and also segmental pulmonary emboli in May.  He spontaneously converted to sinus rhythm and remained so today by ECG.  He does not describe any palpitations.  At this time he is on Eliquis with anticipated 67-month course for treatment of pulmonary emboli, not entirely clear that he will need long-term anticoagulation at this point.  His CHA2DS2-VASc score is 5.  2.  Multiple subsegmental pulmonary emboli in the setting of COVID-19 pneumonia in May.  Would anticipate a 56-month course of anticoagulation, he is presently on Eliquis.  Still using supplemental oxygen at home which was provided at discharge.  I have recommended that he follow-up with his PCP over the next month for repeat ambulatory oxygen saturation to determine if he needs to stay on this or not.  This will be managed by PCP.  3.  Type II NSTEMI in the setting of COVID-19, hypoxic respiratory failure, and pulmonary emboli.  Peak high-sensitivity troponin I of 4543.  He does have coronary artery calcifications by CT imaging, prior history of some exertional fatigue.  Also family history of premature CAD.  LVEF was 50 to 55% with  mid to basal inferior/posterior hypokinesis.  Anticipate further ischemic work-up, potentially cardiac catheterization, but would defer this until he completes a 39-month course of anticoagulation for treatment of pulmonary emboli.  4.  Previous history of stroke, he has been on Plavix long-term.  Medication Adjustments/Labs and Tests Ordered: Current medicines are reviewed at length with the patient today.  Concerns regarding medicines are outlined above.   Tests Ordered: Orders Placed This Encounter  Procedures  . Basic Metabolic Panel (BMET)  . CBC  . EKG 12-Lead    Medication Changes: Meds ordered this encounter  Medications  . pantoprazole (PROTONIX) 40 MG tablet    Sig: Take 1 tablet (40 mg total) by mouth daily.    Dispense:  30 tablet    Refill:  6  . apixaban (ELIQUIS) 5 MG TABS tablet    Sig: Take 1 tablet (5 mg total) by mouth 2 (two) times daily.    Dispense:  60 tablet    Refill:  11    Disposition:  Follow up 2 months in the [place] office with CBC and BMET.  Signed, Satira Sark, MD, [facility] 01/17/2020 11:29 AM    [facility] [address] [place], [place] [address] Phone: [phone]; Fax: [phone]

## 2020-01-19 ENCOUNTER — Encounter: Payer: Self-pay | Admitting: *Deleted

## 2020-01-19 ENCOUNTER — Other Ambulatory Visit: Payer: Self-pay | Admitting: *Deleted

## 2020-01-19 NOTE — Patient Outreach (Signed)
[place] [facility]) Care Management [facility] CM Telephone Outreach PCP office completes Transition of Care follow up post-hospital discharge Post-hospital discharge day # 39 without unplanned hospital re-admission  01/19/2020  [place] 07-09-1946 [id number]  Successful telephone outreach to spouse/ caregiver(Patrick Miranda)of [name], 74 y/o male referred to [facility] CM5/24/21by [facility] liaison after patient experienced recent hospitalization May 14-23, 2036for shortness of breath with new-onset A-Fib with RVR and pneumonia/ pulmonary emboli/ acute respiratory failure with hypoxia secondary to COVID-19 infection. Patient was discharged home with home oxygen and home health services through [facility]. Patient has history including, but not limited to, DM- TII; CKD- stage II; HTN/ HLD; OSA- uses HS CPAP; and CVA.Prior to this hospitalization, patient has had no other recent hospital visits.  HIPAA/ identity verified and caregiver and patient both participate in phone call today while phone on speaker mode; both report patient "doing much better," and both deny current clinical concerns.  Spouse/ caregiver further report: -- attended cardiology office visit yesterday; "got a good report;" confirmed that patient is in NSR, "normal" heart rhythm -- able to accurately verbalize current recommendation/ instruction for dosing of anticoagulant therapy -- received and continue reviewing printed educational material previously mailed to them -- provided education around signs/ symptoms A-Fib/ MI/ CVA along with corresponding action plan for same -- patient returned his home oxygenator to DME company; reports he has been off of home oxygen for several weeks now -- confirm that they continue following low salt/ heart healthy/ diabetic diet -- continues monitoring and recording blood sugars at home; again reports blood sugars at home "great;"  ---- fasting ranges  between 105- 127 consistently ---- post-prandial ranges between 112-150  Patient and spouse deny additional needs/ concerns and  problems today. Iconfirmed that that they havemy direct phone number, the main [facility] CM office phone number, and the [facility] CM 24-hour nurse advice phone number should issues arise prior to next scheduled [facility] CM outreach next month. Encouraged patient/ caregiverto contact me directly if needs, questions, issues, or concerns arise prior to next scheduled outreach; theyagreed to do so.  Plan:  Patient will take medications as prescribed and will attend all scheduled provider appointments  Patient will promptly notify care providers for any new concerns/ issues/ problems that arise  Patient and spouse/ caregiver will continue reviewing previously provided printed educational material around A-fib and will follow corresponding action plan for development of symptoms/ concerns  [facility] CM outreach to continue with scheduled phone call next month  Oneta Rack, RN, BSN, [name]  [phone]

## 2020-01-26 DIAGNOSIS — I2694 Multiple subsegmental pulmonary emboli without acute cor pulmonale: Secondary | ICD-10-CM | POA: Diagnosis not present

## 2020-01-26 DIAGNOSIS — J1282 Pneumonia due to coronavirus disease 2019: Secondary | ICD-10-CM | POA: Diagnosis not present

## 2020-01-26 DIAGNOSIS — I214 Non-ST elevation (NSTEMI) myocardial infarction: Secondary | ICD-10-CM | POA: Diagnosis not present

## 2020-01-26 DIAGNOSIS — U071 COVID-19: Secondary | ICD-10-CM | POA: Diagnosis not present

## 2020-01-26 DIAGNOSIS — J9601 Acute respiratory failure with hypoxia: Secondary | ICD-10-CM | POA: Diagnosis not present

## 2020-01-26 DIAGNOSIS — I10 Essential (primary) hypertension: Secondary | ICD-10-CM | POA: Diagnosis not present

## 2020-02-20 ENCOUNTER — Encounter: Payer: Self-pay | Admitting: *Deleted

## 2020-02-20 ENCOUNTER — Other Ambulatory Visit: Payer: Self-pay | Admitting: *Deleted

## 2020-02-20 NOTE — Patient Outreach (Signed)
[facility]) Care Management [facility] CM Telephone Outreach Post-hospital discharge day # 71 Unsuccessful (consecutive) outreach attempt #1- previously engaged patient/ caregiver  02/20/2020  [place] 07-06-1946 [id number]   Unsuccessful telephone outreachtospouse/ caregiver([name])of [place], 74 y/o male referred to [facility] CM5/24/21by [facility] liaison after patient experienced recent hospitalization May 14-23, 2066for shortness of breath with new-onset A-Fib with RVR and pneumonia/ pulmonary emboli/ acute respiratory failure with hypoxia secondary to COVID-19 infection. Patient was discharged home with home oxygen and home health services through [facility]. Patient has history including, but not limited to, DM- TII; CKD- stage II; HTN/ HLD; OSA- uses HS CPAP; and CVA.Prior to this hospitalization, patient has had no other recent hospital visits.  HIPAA compliant voice mail message left for patient, requesting return call back.  Plan:  Will re-attempt [facility] CM telephone outreach within 4 business days if I do not hear back from patient / caregiver first  Oneta Rack, RN, BSN, [place] [facility]  [phone]

## 2020-02-24 ENCOUNTER — Other Ambulatory Visit: Payer: Self-pay | Admitting: *Deleted

## 2020-02-24 ENCOUNTER — Encounter: Payer: Self-pay | Admitting: *Deleted

## 2020-02-24 NOTE — Patient Outreach (Signed)
[facility]) Care Management [facility] CM Telephone Outreach Post-hospital discharge day # 75 without unplanned hospital re-admission  02/24/2020  [place] 02-Nov-1945 [id number]  Successful telephone outreachtospouse/ caregiver([name])of [place], 74 y/o male referred to [facility] CM5/24/21by [facility] liaison after patient experienced recent hospitalization May 14-23, 2060for shortness of breath with new-onset A-Fib with RVR and pneumonia/ pulmonary emboli/ acute respiratory failure with hypoxia secondary to COVID-19 infection. Patient was discharged home with home oxygen and home health services through [facility]. Patient has history including, but not limited to, DM- TII; CKD- stage II; HTN/ HLD; OSA- uses HS CPAP; and CVA.Prior to this hospitalization, patient has had no other recent hospital visits.  HIPAA/ identity verified and caregiver and patient both participate in phone call today while phone on speaker mode; both report patient "doing great," and both deny current clinical concerns.  Reports patient is currently driving and they are out running errands today  Spouse/ caregiver further report: -- reiterated/ reviewed previously provided education around signs/ symptoms A-Fib/ MI/ CVA along with corresponding action plan for same: confirmed that patient does not believe he has been in A-Fib recently/ no signs/ symptoms -- home health services now completed -- patient independent in self-care activities: driving; mowing lawn; working out in yard and tolerating all without difficulty -- confirm that they continue following low salt/ heart healthy/ diabetic diet -- continues monitoring and recording blood sugars at home; again reports blood sugars at home "in same range without changes;"  ---- fasting blood sugar this morning 140 ---- unable to review other blood sugars today as he is driving and not near recorded values  Patient and  spouse deny additional needs/ concerns and  problems today. Iconfirmed thatthat they havemy direct phone number, the main [facility] CM office phone number, and the [facility] CM 24-hour nurse advice phone number should issues arise prior to next scheduled [facility] CM outreach next month. Discussed with patient and caregiver that patient is making great progress in meeting established goals-- discussed possibility of transfer to [place] at time of next outreach if he continues self-managing care and does not have ongoing care coordination needs; patient and caregiver are agreeable to this.  Encouraged patient/ caregiverto contact me directly if needs, questions, issues, or concerns arise prior to next scheduled outreach; theyagreed to do so.  Plan:  Patient will take medications as prescribed and will attend all scheduled provider appointments  Patient will promptly notify care providers for any new concerns/ issues/ problems that arise  Patient and spouse/ caregiver will continue reviewing previously provided printed educational material around A-fib and will follow corresponding action plan for development of symptoms/ concerns  [facility]N CM outreach to continue with scheduled phonecall next month, possibly for transfer to [place], RN, BSN, [place] [facility]  [phone]

## 2020-03-12 ENCOUNTER — Other Ambulatory Visit (HOSPITAL_COMMUNITY)
Admission: RE | Admit: 2020-03-12 | Discharge: 2020-03-12 | Disposition: A | Discharge status: Home / Self Care | Payer: Medicare Other | Source: Ambulatory Visit | Attending: Cardiology | Admitting: Cardiology

## 2020-03-12 DIAGNOSIS — Z79899 Other long term (current) drug therapy: Secondary | ICD-10-CM | POA: Diagnosis not present

## 2020-03-12 DIAGNOSIS — Z8679 Personal history of other diseases of the circulatory system: Secondary | ICD-10-CM | POA: Insufficient documentation

## 2020-03-12 LAB — CBC
HCT: 41.1 % (ref 39.0–52.0)
Hemoglobin: 12.6 g/dL — ABNORMAL LOW (ref 13.0–17.0)
MCH: 28.9 pg (ref 26.0–34.0)
MCHC: 30.7 g/dL (ref 30.0–36.0)
MCV: 94.3 fL (ref 80.0–100.0)
Platelets: 270 10*3/uL (ref 150–400)
RBC: 4.36 MIL/uL (ref 4.22–5.81)
RDW: 14.4 % (ref 11.5–15.5)
WBC: 6.5 10*3/uL (ref 4.0–10.5)
nRBC: 0 % (ref 0.0–0.2)

## 2020-03-12 LAB — BASIC METABOLIC PANEL
Anion gap: 9 (ref 5–15)
BUN: 19 mg/dL (ref 8–23)
CO2: 23 mmol/L (ref 22–32)
Calcium: 9.1 mg/dL (ref 8.9–10.3)
Chloride: 106 mmol/L (ref 98–111)
Creatinine, Ser: 1.27 mg/dL — ABNORMAL HIGH (ref 0.61–1.24)
GFR calc Af Amer: >60 mL/min (ref >60)
GFR calc non Af Amer: 55 mL/min — ABNORMAL LOW (ref >60)
Glucose, Bld: 177 mg/dL — ABNORMAL HIGH (ref 70–99)
Potassium: 4.4 mmol/L (ref 3.5–5.1)
Sodium: 138 mmol/L (ref 135–145)

## 2020-03-13 ENCOUNTER — Other Ambulatory Visit: Payer: Self-pay

## 2020-03-13 ENCOUNTER — Ambulatory Visit
Admission: EM | Admit: 2020-03-13 | Discharge: 2020-03-13 | Disposition: A | Discharge status: Home / Self Care | Payer: Medicare Other | Attending: Emergency Medicine | Admitting: Emergency Medicine

## 2020-03-13 DIAGNOSIS — J069 Acute upper respiratory infection, unspecified: Secondary | ICD-10-CM | POA: Diagnosis not present

## 2020-03-13 DIAGNOSIS — Z1152 Encounter for screening for COVID-19: Secondary | ICD-10-CM

## 2020-03-13 MED ORDER — BENZONATATE 100 MG PO CAPS
100.0000 mg | ORAL_CAPSULE | Freq: Three times a day (TID) | ORAL | 0 refills | Status: DC
Start: 2020-03-13 — End: 2020-07-16

## 2020-03-13 MED ORDER — PREDNISONE 10 MG PO TABS
20.0000 mg | ORAL_TABLET | Freq: Every day | ORAL | 0 refills | Status: DC
Start: 2020-03-13 — End: 2020-03-13

## 2020-03-13 MED ORDER — BENZONATATE 100 MG PO CAPS
100.0000 mg | ORAL_CAPSULE | Freq: Three times a day (TID) | ORAL | 0 refills | Status: DC
Start: 2020-03-13 — End: 2020-03-13

## 2020-03-13 MED ORDER — PREDNISONE 10 MG PO TABS
20.0000 mg | ORAL_TABLET | Freq: Every day | ORAL | 0 refills | Status: DC
Start: 2020-03-13 — End: 2020-03-19

## 2020-03-13 MED ORDER — AZITHROMYCIN 250 MG PO TABS
250.0000 mg | ORAL_TABLET | Freq: Every day | ORAL | 0 refills | Status: DC
Start: 2020-03-13 — End: 2020-03-19

## 2020-03-13 NOTE — Progress Notes (Signed)
Cardiology Office Note    Date:  03/19/2020   ID:  SONYA PUCCI, DOB 03/09/1946, MRN [id number]  PCP:  Burnard Bunting, MD  Cardiologist: Rozann Lesches, MD EPS: None  Chief Complaint  Patient presents with  . Follow-up    History of Present Illness:  Patrick Miranda is a 74 y.o. male  with history of atrial fibrillation and type 2 NSTEMI in the setting of multiple subsegmental pulmonary emboli with acute cor pulmonale and COVID-19 in May of this year.   Chest CTA that diagnosed as pulmonary emboli also mentioned coronary artery calcification.  He does not have any known history of obstructive CAD.  Echocardiogram from May 15 revealed LVEF 50 to 55% with mid to basal inferior/posterior hypokinesis.  RV function was described as normal. Plan for potential cath after 6 months of anticoagulation for PE.  Patient comes in for f/u accompanied his wife. Denies chest pain, shortness of breath, palpitations. Gets fatigued easily.    Past Medical History:  Diagnosis Date  . Arthritis   . 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 Cerebral artery disease   . COVID-07 Dec 2019  . Depression   . Essential hypertension   . Hernia, umbilical   . HOH (hard of hearing)   . Hyperlipidemia   . OSA on CPAP   . Pulmonary emboli [facility])    May 2021  . Type 2 diabetes mellitus ([place])     Past Surgical History:  Procedure Laterality Date  . COLONOSCOPY W/ POLYPECTOMY    . TOTAL KNEE ARTHROPLASTY Left 04/22/2013   Procedure: TOTAL KNEE ARTHROPLASTY- left;  Surgeon: Augustin Schooling, MD;  Location: [place];  Service: Orthopedics;  Laterality: Left;  with femoral block  . TOTAL KNEE ARTHROPLASTY Right 01/06/2014   Procedure: RIGHT TOTAL KNEE ARTHROPLASTY;  Surgeon: Augustin Schooling, MD;  Location: [place];  Service: Orthopedics;  Laterality: Right;    Current Medications: Current Meds  Medication Sig  . apixaban (ELIQUIS) 5 MG TABS tablet Take 1 tablet (5 mg total) by mouth 2 (two) times  daily.  . BD PEN NEEDLE NANO U/F 32G X 4 MM MISC 1 each by Other route daily.   . benzonatate (TESSALON) 100 MG capsule Take 1 capsule (100 mg total) by mouth every 8 (eight) hours.  . carvedilol (COREG) 12.5 MG tablet Take 12.5 mg by mouth 2 (two) times daily with a meal.  . clopidogrel (PLAVIX) 75 MG tablet Take 75 mg by mouth daily with breakfast.  . Empagliflozin-Metformin HCl (SYNJARDY) 11-998 MG TABS Take 1 tablet by mouth 2 (two) times daily.  . fenofibrate 160 MG tablet Take 160 mg by mouth daily.  Marland Kitchen HUMALOG KWIKPEN 100 UNIT/ML KwikPen Inject 10 Units into the skin with breakfast, with lunch, and with evening meal.   . ipratropium (ATROVENT) 0.03 % nasal spray 1-2 sprays each nostril every 8-12 hours if needed  . methylphenidate (RITALIN) 5 MG tablet Take 5 mg by mouth once a week. On Sunday mornings  . ONETOUCH VERIO test strip 1 each by Other route in the morning, at noon, and at bedtime.   . pantoprazole (PROTONIX) 40 MG tablet Take 1 tablet (40 mg total) by mouth daily.  [name] 300 UNIT/ML SOPN Inject 17 Units into the skin daily.      Allergies:   Lipitor [atorvastatin]   Social History   Socioeconomic History  . Marital status: Married    Spouse name: Not on  file  . Number of children: Not on file  . Years of education: Not on file  . Highest education level: Not on file  Occupational History  . Not on file  Tobacco Use  . Smoking status: Never Smoker  . Smokeless tobacco: Never Used  Substance and Sexual Activity  . Alcohol use: No  . Drug use: No  . Sexual activity: Not on file  Other Topics Concern  . Not on file  Social History Narrative  . Not on file   Social Determinants of Health   Financial Resource Strain:   . Difficulty of Paying Living Expenses: Not on file  Food Insecurity: No Food Insecurity  . Worried About Charity fundraiser in the Last Year: Never true  . Ran Out of Food in the Last Year: Never true  Transportation Needs: No  Transportation Needs  . Lack of Transportation (Medical): No  . Lack of Transportation (Non-Medical): No  Physical Activity:   . Days of Exercise per Week: Not on file  . Minutes of Exercise per Session: Not on file  Stress:   . Feeling of Stress : Not on file  Social Connections:   . Frequency of Communication with Friends and Family: Not on file  . Frequency of Social Gatherings with Friends and Family: Not on file  . Attends Religious Services: Not on file  . Active Member of Clubs or Organizations: Not on file  . Attends Archivist Meetings: Not on file  . Marital Status: Not on file     Family History:  The patient's family history includes CAD in his brother.   ROS:   Please see the history of present illness.    ROS All other systems reviewed and are negative.   PHYSICAL EXAM:   VS:  BP 132/66   Pulse 67   Ht 5' 10.5" (1.791 m)   Wt 173 lb (78.5 kg)   SpO2 97%   BMI 24.47 kg/m   Physical Exam  GEN: Well nourished, well developed, in no acute distress  Neck: no JVD, carotid bruits, or masses Cardiac:RRR; no murmurs, rubs, or gallops  Respiratory:  Decreased breath sounds with fine crackles at bases.  GI: soft, nontender, nondistended, + BS Ext: without cyanosis, clubbing, or edema, Good distal pulses bilaterally Neuro:  Alert and Oriented x 3 Psych: euthymic mood, full affect  Wt Readings from Last 3 Encounters:  03/19/20 173 lb (78.5 kg)  01/17/20 168 lb 12.8 oz (76.6 kg)  12/11/19 168 lb 3.4 oz (76.3 kg)      Studies/Labs Reviewed:   EKG:  EKG is not ordered today.    Recent Labs: 12/02/2019: B Natriuretic Peptide 439.8; TSH 1.228 12/07/2019: Magnesium 2.0 12/08/2019: ALT 10 03/12/2020: BUN 19; Creatinine, Ser 1.27; Hemoglobin 12.6; Platelets 270; Potassium 4.4; Sodium 138   Lipid Panel    Component Value Date/Time   TRIG 211 (H) 12/02/2019 1548    Additional studies/ records that were reviewed today include:  Echocardiogram  12/03/2019: 1. Mid and basal inferior/posterior lateral wall hypokinesis . Left  ventricular ejection fraction, by estimation, is 50 to 55%. The left  ventricle has low normal function. The left ventricle demonstrates  regional wall motion abnormalities (see scoring  diagram/findings for description). The left ventricular internal cavity  size was mildly dilated. Left ventricular diastolic parameters are  indeterminate.   2. Right ventricular systolic function is normal. The right ventricular  size is normal. There is mildly elevated pulmonary artery systolic  pressure.   3. Right atrial size was mildly dilated.   4. The mitral valve is normal in structure. Mild mitral valve  regurgitation. No evidence of mitral stenosis.   5. The aortic valve is tricuspid. Aortic valve regurgitation is mild.  Mild aortic valve sclerosis is present, with no evidence of aortic valve  stenosis.   6. The inferior vena cava is normal in size with greater than 50%  respiratory variability, suggesting right atrial pressure of 3 mmHg.           ASSESSMENT:    1. Paroxysmal atrial fibrillation (HCC)   2. Multiple subsegmental pulmonary emboli without acute cor pulmonale (HCC)   3. Coronary artery calcification seen on CT scan   4. Brainstem stroke (HCC)      PLAN:  In order of problems listed above:  Assessment and Plan:   1.  Transient atrial fibrillation with RVR noted in the setting of COVID-19 pneumonia with hypoxic respiratory failure and also segmental pulmonary emboli in May.  He spontaneously converted to sinus rhythm and remained so today by ECG.  He does not describe any palpitations.  At this time he is on Eliquis with anticipated 13-month course for treatment of pulmonary emboli, not entirely clear that he will need long-term anticoagulation at this point.  His CHA2DS2-VASc score is 5. No palpitations and heart rate regular today. Crt 1.27-up a little. Was on steroids recently   2.   Multiple subsegmental pulmonary emboli in the setting of COVID-19 pneumonia in May.  Would anticipate a 10-month course of anticoagulation, he is presently on Eliquis.  Still using supplemental oxygen at home which was provided at discharge.  I have recommended that he follow-up with his PCP over the next month for repeat ambulatory oxygen saturation to determine if he needs to stay on this or not.  This will be managed by PCP.   3.  Type II NSTEMI in the setting of COVID-19, hypoxic respiratory failure, and pulmonary emboli.  Peak high-sensitivity troponin I of 4543.  He does have coronary artery calcifications by CT imaging, prior history of some exertional fatigue.  Also family history of premature CAD.  LVEF was 50 to 55% with mid to basal inferior/posterior hypokinesis.  Anticipate further ischemic work-up, potentially cardiac catheterization, but would defer this until he completes a 12-month course of anticoagulation for treatment of pulmonary emboli.No angina   4.  Previous history of stroke, he has been on Plavix long-term.     Medication Adjustments/Labs and Tests Ordered: Current medicines are reviewed at length with the patient today.  Concerns regarding medicines are outlined above.  Medication changes, Labs and Tests ordered today are listed in the Patient Instructions below. Patient Instructions  Medication Instructions:  Your physician recommends that you continue on your current medications as directed. Please refer to the Current Medication list given to you today.  *If you need a refill on your cardiac medications before your next appointment, please call your pharmacy*   Lab Work: None today If you have labs (blood work) drawn today and your tests are completely normal, you will receive your results only by: Marland Kitchen MyChart Message (if you have MyChart) OR . A paper copy in the mail If you have any lab test that is abnormal or we need to change your treatment, we will call you to  review the results.   Testing/Procedures: None today   Follow-Up: At [facility], you and your health needs are our priority.  As part  of our continuing mission to provide you with exceptional heart care, we have created designated Provider Care Teams.  These Care Teams include your primary Cardiologist (physician) and Advanced Practice Providers (APPs -  Physician Assistants and Nurse Practitioners) who all work together to provide you with the care you need, when you need it.  We recommend signing up for the patient portal called "MyChart".  Sign up information is provided on this After Visit Summary.  MyChart is used to connect with patients for Virtual Visits (Telemedicine).  Patients are able to view lab/test results, encounter notes, upcoming appointments, etc.  Non-urgent messages can be sent to your provider as well.   To learn more about what you can do with MyChart, go to [email or web address].    Your next appointment:   3 month(s)  The format for your next appointment:   In Person  Provider:   Rozann Lesches, MD   Other Instructions None         Thank you for choosing [facility] !            Sumner Boast, PA-C  03/19/2020 11:46 AM    [facility] [address], [place], [place]  [address] Phone: [phone]; Fax: [phone]

## 2020-03-13 NOTE — ED Triage Notes (Addendum)
Pt presents with complaints of a cough and nasal congestion that started on Sunday. Reports recent history of pneumonia. Denies any fever, shortness of breath, or chest pain. Pt has not been vaccinated for covid. Reports having covid in May. Will hold covid test until seen by provider.

## 2020-03-13 NOTE — ED Provider Notes (Signed)
[place]   [id number] 03/13/20 Arrival Time: 0801   CC: COVID symptoms  SUBJECTIVE: History from: patient and family.  Patrick Miranda is a 74 y.o. male who presented to the urgent care for complaint of nasal congestion, cough and sinus pressure for the past 2 days.  Denies sick exposure to COVID, flu or strep.  Denies recent travel.  Has tried OTC Sudafed without relief.  Denies aggravating factors.  Denies previous symptoms in the past.   Denies fever, chills, fatigue, sinus pain, rhinorrhea, sore throat, SOB, wheezing, chest pain, nausea, changes in bowel or bladder habits.     ROS: As per HPI.  All other pertinent ROS negative.      Past Medical History:  Diagnosis Date  . Arthritis   . 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 Cerebral artery disease   . COVID-07 Dec 2019  . Depression   . Essential hypertension   . Hernia, umbilical   . HOH (hard of hearing)   . Hyperlipidemia   . OSA on CPAP   . Pulmonary emboli [facility])    May 2021  . Type 2 diabetes mellitus ([place])    Past Surgical History:  Procedure Laterality Date  . COLONOSCOPY W/ POLYPECTOMY    . TOTAL KNEE ARTHROPLASTY Left 04/22/2013   Procedure: TOTAL KNEE ARTHROPLASTY- left;  Surgeon: Augustin Schooling, MD;  Location: [place];  Service: Orthopedics;  Laterality: Left;  with femoral block  . TOTAL KNEE ARTHROPLASTY Right 01/06/2014   Procedure: RIGHT TOTAL KNEE ARTHROPLASTY;  Surgeon: Augustin Schooling, MD;  Location: [place];  Service: Orthopedics;  Laterality: Right;   Allergies  Allergen Reactions  . Lipitor [Atorvastatin] Other (See Comments)    Muscle pain   No current facility-administered medications on file prior to encounter.   Current Outpatient Medications on File Prior to Encounter  Medication Sig Dispense Refill  . apixaban (ELIQUIS) 5 MG TABS tablet Take 1 tablet (5 mg total) by mouth 2 (two) times daily. 60 tablet 11  . BD PEN NEEDLE NANO U/F 32G X 4 MM MISC 1 each by Other route  daily.     . carvedilol (COREG) 12.5 MG tablet Take 12.5 mg by mouth 2 (two) times daily with a meal.    . clopidogrel (PLAVIX) 75 MG tablet Take 75 mg by mouth daily with breakfast.    . Empagliflozin-Metformin HCl (SYNJARDY) 11-998 MG TABS Take 1 tablet by mouth 2 (two) times daily.    . fenofibrate 160 MG tablet Take 160 mg by mouth daily.    Marland Kitchen HUMALOG KWIKPEN 100 UNIT/ML KwikPen Inject 10 Units into the skin with breakfast, with lunch, and with evening meal.     . ipratropium (ATROVENT) 0.03 % nasal spray 1-2 sprays each nostril every 8-12 hours if needed 30 mL 12  . methylphenidate (RITALIN) 5 MG tablet Take 1 tablet (5 mg total) by mouth 2 (two) times daily. If needed for sleepiness 30 tablet 0  . ONETOUCH VERIO test strip 1 each by Other route in the morning, at noon, and at bedtime.   3  . pantoprazole (PROTONIX) 40 MG tablet Take 1 tablet (40 mg total) by mouth daily. 30 tablet 6  . TOUJEO SOLOSTAR 300 UNIT/ML SOPN Inject 17 Units into the skin daily.      Social History   Socioeconomic History  . Marital status: Married    Spouse name: Not on file  . Number of children: Not on file  .  Years of education: Not on file  . Highest education level: Not on file  Occupational History  . Not on file  Tobacco Use  . Smoking status: Never Smoker  . Smokeless tobacco: Never Used  Substance and Sexual Activity  . Alcohol use: No  . Drug use: No  . Sexual activity: Not on file  Other Topics Concern  . Not on file  Social History Narrative  . Not on file   Social Determinants of Health   Financial Resource Strain:   . Difficulty of Paying Living Expenses: Not on file  Food Insecurity: No Food Insecurity  . Worried About Charity fundraiser in the Last Year: Never true  . Ran Out of Food in the Last Year: Never true  Transportation Needs: No Transportation Needs  . Lack of Transportation (Medical): No  . Lack of Transportation (Non-Medical): No  Physical Activity:   . Days of  Exercise per Week: Not on file  . Minutes of Exercise per Session: Not on file  Stress:   . Feeling of Stress : Not on file  Social Connections:   . Frequency of Communication with Friends and Family: Not on file  . Frequency of Social Gatherings with Friends and Family: Not on file  . Attends Religious Services: Not on file  . Active Member of Clubs or Organizations: Not on file  . Attends Archivist Meetings: Not on file  . Marital Status: Not on file  Intimate Partner Violence:   . Fear of Current or Ex-Partner: Not on file  . Emotionally Abused: Not on file  . Physically Abused: Not on file  . Sexually Abused: Not on file   Family History  Problem Relation Age of Onset  . CAD Brother        Premature    OBJECTIVE:  Vitals:   03/13/20 0806  BP: 140/70  Pulse: 77  Resp: 16  Temp: 98.6 F (37 C)  TempSrc: Oral  SpO2: 97%     General appearance: alert; appears fatigued, but nontoxic; speaking in full sentences and tolerating own secretions HEENT: NCAT; Ears: EACs clear, TMs pearly gray; Eyes: PERRL.  EOM grossly intact. Sinuses: nontender; Nose: nares patent without rhinorrhea, Throat: oropharynx clear, tonsils non erythematous or enlarged, uvula midline  Neck: supple without LAD Lungs: unlabored respirations, symmetrical air entry; cough: moderate; no respiratory distress; CTAB Heart: regular rate and rhythm.  Radial pulses 2+ symmetrical bilaterally Skin: warm and dry Psychological: alert and cooperative; normal mood and affect  LABS:  Results for orders placed or performed during the hospital encounter of 03/12/20 (from the past 24 hour(s))  Basic metabolic panel     Status: Abnormal   Collection Time: 03/12/20  9:59 AM  Result Value Ref Range   Sodium 138 135 - 145 mmol/L   Potassium 4.4 3.5 - 5.1 mmol/L   Chloride 106 98 - 111 mmol/L   CO2 23 22 - 32 mmol/L   Glucose, Bld 177 (H) 70 - 99 mg/dL   BUN 19 8 - 23 mg/dL   Creatinine, Ser 1.27 (H) 0.61  - 1.24 mg/dL   Calcium 9.1 8.9 - 10.3 mg/dL   GFR calc non Af Amer 55 (L) >60 mL/min   GFR calc Af Amer >60 >60 mL/min   Anion gap 9 5 - 15  CBC     Status: Abnormal   Collection Time: 03/12/20  9:59 AM  Result Value Ref Range   WBC 6.5 4.0 - 10.5  K/uL   RBC 4.36 4.22 - 5.81 MIL/uL   Hemoglobin 12.6 (L) 13.0 - 17.0 g/dL   HCT 41.1 39 - 52 %   MCV 94.3 80.0 - 100.0 fL   MCH 28.9 26.0 - 34.0 pg   MCHC 30.7 30.0 - 36.0 g/dL   RDW 14.4 11.5 - 15.5 %   Platelets 270 150 - 400 K/uL   nRBC 0.0 0.0 - 0.2 %     ASSESSMENT & PLAN:  1. URI with cough and congestion   2. Encounter for screening for COVID-19     Meds ordered this encounter  Medications  . benzonatate (TESSALON) 100 MG capsule    Sig: Take 1 capsule (100 mg total) by mouth every 8 (eight) hours.    Dispense:  30 capsule    Refill:  0  . predniSONE (DELTASONE) 10 MG tablet    Sig: Take 2 tablets (20 mg total) by mouth daily.    Dispense:  15 tablet    Refill:  0    Discharge Instructions   COVID testing ordered.  It will take between 2-7 days for test results.  Someone will contact you regarding abnormal results.    In the meantime: You should remain isolated in your home for 10 days from symptom onset AND greater than 72 hours after symptoms resolution (absence of fever without the use of fever-reducing medication and improvement in respiratory symptoms), whichever is longer Get plenty of rest and push fluids Tessalon Perles prescribed for cough Low-dose prednisone was prescribed Use medications daily for symptom relief Use OTC medications like ibuprofen or tylenol as needed fever or pain Call or go to the ED if you have any new or worsening symptoms such as fever, worsening cough, shortness of breath, chest tightness, chest pain, turning blue, changes in mental status, etc...   Reviewed expectations re: course of current medical issues. Questions answered. Outlined signs and symptoms indicating need for more  acute intervention. Patient verbalized understanding. After Visit Summary given.     Note: This document was prepared using Dragon voice recognition software and may include unintentional dictation errors.     Emerson Monte, FNP 03/13/20 [phone]

## 2020-03-13 NOTE — Discharge Instructions (Signed)
COVID testing ordered.  It will take between 2-7 days for test results.  Someone will contact you regarding abnormal results.    In the meantime: You should remain isolated in your home for 10 days from symptom onset AND greater than 72 hours after symptoms resolution (absence of fever without the use of fever-reducing medication and improvement in respiratory symptoms), whichever is longer Get plenty of rest and push fluids Tessalon Perles prescribed for cough Low-dose prednisone was prescribed Use medications daily for symptom relief Use OTC medications like ibuprofen or tylenol as needed fever or pain Call or go to the ED if you have any new or worsening symptoms such as fever, worsening cough, shortness of breath, chest tightness, chest pain, turning blue, changes in mental status, etc..Marland Kitchen

## 2020-03-15 ENCOUNTER — Encounter: Payer: Self-pay | Admitting: *Deleted

## 2020-03-15 ENCOUNTER — Other Ambulatory Visit: Payer: Self-pay | Admitting: *Deleted

## 2020-03-15 LAB — NOVEL CORONAVIRUS, NAA: SARS-CoV-2, NAA: NOT DETECTED

## 2020-03-15 LAB — SARS-COV-2, NAA 2 DAY TAT

## 2020-03-15 NOTE — Patient Outreach (Signed)
[facility]) Care Management [place] Coordination  03/15/2020  Patrick Miranda 09/06/1945 [id number]  Unsuccessful telephone outreachfor care coordination to tospouse/ caregiver([name])of [place], 74 y/o male referred to [facility] CM5/24/21by [facility] liaison after patient experienced recent hospitalization May 14-23, 2054for shortness of breath with new-onset A-Fib with RVR and pneumonia/ pulmonary emboli/ acute respiratory failure with hypoxia secondary to COVID-19 infection. Patient was discharged home with home oxygen and home health services through [facility]. Patient has history including, but not limited to, DM- TII; CKD- stage II; HTN/ HLD; OSA- uses HS CPAP; and CVA.Prior to this hospitalization, patient has had no other recent hospital visits.  Noted through review of EHR that patient had recent [facility]/ ED visit 03/13/20 for URI symptoms; patient was discharged home to self-care post [facility] visit and resulted negativefor COVID-19 testing 03/13/20.  Call was placed today to follow up on [place] visit to assess patient progress/ potential needs.  HIPAA compliant voice mail message left for patient/ caregiver, requesting return call back.  Shared with patient/ caregiver that I was calling to follow up on patient's recent [place] visit; encouraged call back should patient have concerns around his clinical condition or unmet care needs; otherwise, will contact patient for monthly call as previously scheduled.  Plan:  [facility] CM outreach to continue with monthly phonecall as previously scheduled for 03/27/20  Oneta Rack, RN, BSN, [place] Care Management  [phone]

## 2020-03-19 ENCOUNTER — Encounter: Payer: Self-pay | Admitting: Physician Assistant

## 2020-03-19 ENCOUNTER — Other Ambulatory Visit: Payer: Self-pay

## 2020-03-19 ENCOUNTER — Ambulatory Visit (INDEPENDENT_AMBULATORY_CARE_PROVIDER_SITE_OTHER): Payer: Medicare Other | Admitting: Physician Assistant

## 2020-03-19 VITALS — BP 132/66 | HR 67 | Ht 70.5 in | Wt 173.0 lb

## 2020-03-19 DIAGNOSIS — I639 Cerebral infarction, unspecified: Secondary | ICD-10-CM | POA: Diagnosis not present

## 2020-03-19 DIAGNOSIS — I480 Paroxysmal atrial fibrillation: Secondary | ICD-10-CM | POA: Diagnosis not present

## 2020-03-19 DIAGNOSIS — I2510 Atherosclerotic heart disease of native coronary artery without angina pectoris: Secondary | ICD-10-CM

## 2020-03-19 DIAGNOSIS — I2694 Multiple subsegmental pulmonary emboli without acute cor pulmonale: Secondary | ICD-10-CM

## 2020-03-19 NOTE — Patient Instructions (Signed)
Medication Instructions:  Your physician recommends that you continue on your current medications as directed. Please refer to the Current Medication list given to you today.  *If you need a refill on your cardiac medications before your next appointment, please call your pharmacy*   Lab Work: None today If you have labs (blood work) drawn today and your tests are completely normal, you will receive your results only by: . MyChart Message (if you have MyChart) OR . A paper copy in the mail If you have any lab test that is abnormal or we need to change your treatment, we will call you to review the results.   Testing/Procedures: None today   Follow-Up: At [facility], you and your health needs are our priority.  As part of our continuing mission to provide you with exceptional heart care, we have created designated Provider Care Teams.  These Care Teams include your primary Cardiologist (physician) and Advanced Practice Providers (APPs -  Physician Assistants and Nurse Practitioners) who all work together to provide you with the care you need, when you need it.  We recommend signing up for the patient portal called "MyChart".  Sign up information is provided on this After Visit Summary.  MyChart is used to connect with patients for Virtual Visits (Telemedicine).  Patients are able to view lab/test results, encounter notes, upcoming appointments, etc.  Non-urgent messages can be sent to your provider as well.   To learn more about what you can do with MyChart, go to [email or web address].    Your next appointment:   3 month(s)  The format for your next appointment:   In Person  Provider:   Samuel McDowell, MD   Other Instructions None      Thank you for choosing  [facility] !         

## 2020-03-21 ENCOUNTER — Telehealth: Payer: Self-pay | Admitting: Cardiology

## 2020-03-21 NOTE — Telephone Encounter (Signed)
Patient informed. Copy sent to PCP °

## 2020-03-21 NOTE — Telephone Encounter (Signed)
-----   Message from Satira Sark, MD sent at 03/12/2020 10:55 AM EDT ----- Results reviewed.  Creatinine 1.27 with normal potassium, hemoglobin has increased from 11.2-12.6.  Keep scheduled follow-up for review of medications.

## 2020-03-21 NOTE — Telephone Encounter (Signed)
New message    Patient wife returning call  To Cathi

## 2020-03-23 DIAGNOSIS — Z23 Encounter for immunization: Secondary | ICD-10-CM | POA: Diagnosis not present

## 2020-03-27 ENCOUNTER — Encounter: Payer: Self-pay | Admitting: *Deleted

## 2020-03-27 ENCOUNTER — Other Ambulatory Visit: Payer: Self-pay | Admitting: *Deleted

## 2020-03-27 NOTE — Patient Outreach (Signed)
[facility]) Care Management [facility] CM Telephone Outreach Post-hospital discharge day # 107 Unsuccessful (consecutive) outreach attempt # 1- previously engaged patient/ caregiver  03/27/2020  [place] 1946/05/24 [id number]  Unsuccessful (consecutive) telephone outreachattempt # 1 tospouse/ caregiver([name])of [place], 74 y/o male referred to [facility] CM5/24/21by [facility] liaison after patient experienced recent hospitalization May 14-23, 2067for shortness of breath with new-onset A-Fib with RVR and pneumonia/ pulmonary emboli/ acute respiratory failure with hypoxia secondary to COVID-19 infection. Patient was discharged home with home oxygen and [facility]. Patient has history including, but not limited to, DM- TII; CKD- stage II; HTN/ HLD; OSA- uses HS CPAP; and CVA.Prior to this hospitalization, patient has had no other recent hospital visits.  HIPAA compliant voice mail message left for patient, requesting return call back.  Plan:  Will re-attempt [facility] CM telephone outreach within 4 business days if I do not hear back from patient/ caregiver first  Oneta Rack, RN, BSN, [facility] Management  [phone]

## 2020-03-29 ENCOUNTER — Other Ambulatory Visit: Payer: Self-pay | Admitting: *Deleted

## 2020-03-29 ENCOUNTER — Encounter: Payer: Self-pay | Admitting: *Deleted

## 2020-03-29 NOTE — Patient Outreach (Signed)
[facility]) Care Management [facility] CM Telephone Outreach Post-hospital discharge day # 109 without unplanned hospital re-admission  03/29/2020  [place] [birth year] [id number]  Successful telephone outreachtospouse/ caregiver([name])of [place], 74 y/o male referred to [facility] CM5/24/21by [facility] liaison after patient experienced recent hospitalization May 14-23, 209for shortness of breath with new-onset A-Fib with RVR and pneumonia/ pulmonary emboli/ acute respiratory failure with hypoxia secondary to COVID-19 infection. Patient was discharged home with home oxygen and home health services [facility]. Patient has history including, but not limited to, DM- TII; CKD- stage II; HTN/ HLD; OSA- uses HS CPAP; and CVA.Prior to this hospitalization, patient has had no other recent hospital visits.  HIPAA/ identity verified and caregiverreports patient "is doing really good and not having any problems;" she denies current clinical concerns.  Reports patient has been staying active and remains independent in all self-care activities.  Discussed recent [place] visit where patient was diagnosed with sinusitis; she confirms that patient has completed his prescribed antibiotics/ prednisone post- [facility] visit and "is back to normal."   -- discussed current clinical condition with careiver and confirmed that she has no current clinical concerns -- reviewed signs/ symptoms A-Fib along with corresponding action plan; reinforced previously provided education around signs/ symptoms MI/ CVA along with corresponding action plan -- reinforced dietary strategies for low salt diet -- reviewed recent and upcoming provider appointments and recent [facility] for sinusitis: confirmed patient is now better  -- discussed and confirmed that patient obtained his first COVID vaccine: positive reinforcement provided -- confirmed patient continues driving and is independent in  self- care activities -- confirmed patient continues monitoring/ recording blood sugars at home; patient is independent with this activity and wife is not really involved- reports that she believes blood sugars are running fine, as patient has not recently shared anything otherwise with her- she is unsure about what patient's latest A1-C is; states this is handled/ managed by PCP and confirms that patient has a scheduled appointment "in the next month or two" and I encouraged her to obtain most recent A1-C value and she verbalized agreement, acknowledging that his last recorded value while at hospital (9.4) was related to steroid therapy.   -- discussed possible transfer to [place] at time of next (quarterly) outreach, if indicated -- sent [facility] CM note and Care plan goals to PCP as [facility] CM initial assessment  Spouse denies additionalneeds/ concernsandproblems today. Iconfirmed that they havemy direct phone number, the main [facility] CM office phone number, and the [facility] CM 24-hour nurse advice phone number should issues arise prior to next scheduled [facility] CM outreachwhich will be a quarterly outreach; spouse reports she understands to contact me directly for any new concerns, care coordination/ disease management needs prior to next scheduled outreach  Again discussed with caregiver that patient is making great progress in meeting established goals-- discussed possibility of transfer to [place] at time of next outreach if he continues self-managing care and does not have ongoing care coordination needs; caregiver remains agreeable to this.  Encouraged patient/ caregiverto contact me directly if needs, questions, issues, or concerns arise prior to next scheduled outreach; theyagreed to do so.  Plan:  Patient will take medications as prescribed and will attend all scheduled provider appointments  Patient will promptly notify care providers for any new concerns/ issues/ problems  that arise  Patient and spouse/ caregiver will continue reviewing previously provided printed educational material around A-fib and  will follow corresponding action plan for development of symptoms/ concerns  I will sent today's [facility] CM note/ care plan goals with PCP as quarterly update summary  Oneta Rack, RN, BSN, [facility] Management  [phone]    [place] outreach to continue with scheduled quarterly phonecall, possibly for transfer to [place]

## 2020-04-02 DIAGNOSIS — Z85828 Personal history of other malignant neoplasm of skin: Secondary | ICD-10-CM | POA: Diagnosis not present

## 2020-04-02 DIAGNOSIS — L820 Inflamed seborrheic keratosis: Secondary | ICD-10-CM | POA: Diagnosis not present

## 2020-04-02 DIAGNOSIS — L570 Actinic keratosis: Secondary | ICD-10-CM | POA: Diagnosis not present

## 2020-04-02 DIAGNOSIS — L821 Other seborrheic keratosis: Secondary | ICD-10-CM | POA: Diagnosis not present

## 2020-04-12 DIAGNOSIS — E1129 Type 2 diabetes mellitus with other diabetic kidney complication: Secondary | ICD-10-CM | POA: Diagnosis not present

## 2020-04-12 DIAGNOSIS — E785 Hyperlipidemia, unspecified: Secondary | ICD-10-CM | POA: Diagnosis not present

## 2020-04-12 DIAGNOSIS — Z125 Encounter for screening for malignant neoplasm of prostate: Secondary | ICD-10-CM | POA: Diagnosis not present

## 2020-04-13 ENCOUNTER — Other Ambulatory Visit: Payer: Self-pay

## 2020-04-13 ENCOUNTER — Encounter: Payer: Self-pay | Admitting: Podiatry

## 2020-04-13 ENCOUNTER — Ambulatory Visit (INDEPENDENT_AMBULATORY_CARE_PROVIDER_SITE_OTHER): Payer: Medicare Other | Admitting: Podiatry

## 2020-04-13 DIAGNOSIS — D689 Coagulation defect, unspecified: Secondary | ICD-10-CM

## 2020-04-13 DIAGNOSIS — M79676 Pain in unspecified toe(s): Secondary | ICD-10-CM

## 2020-04-13 DIAGNOSIS — E119 Type 2 diabetes mellitus without complications: Secondary | ICD-10-CM

## 2020-04-13 DIAGNOSIS — B351 Tinea unguium: Secondary | ICD-10-CM

## 2020-04-13 DIAGNOSIS — Z23 Encounter for immunization: Secondary | ICD-10-CM | POA: Diagnosis not present

## 2020-04-13 NOTE — Progress Notes (Signed)
This patient returns to my office for at risk foot care.  This patient requires this care by a professional since this patient will be at risk due to having diabetes and CKD.  Patient is taking plavix and eliquis causing coagulation defect. This patient is unable to cut nails himself since the patient cannot reach his nails.These nails are painful walking and wearing shoes.  This patient presents for at risk foot care today.  General Appearance  Alert, conversant and in no acute stress.  Vascular  Dorsalis pedis and posterior tibial  pulses are palpable  bilaterally.  Capillary return is within normal limits  bilaterally. Temperature is within normal limits  bilaterally.  Neurologic  Senn-Weinstein monofilament wire test within normal limits  bilaterally. Muscle power within normal limits bilaterally.  Nails Thick disfigured discolored nails with subungual debris  from hallux to fifth toes bilaterally. No evidence of bacterial infection or drainage bilaterally.  Asymptomatic left hallux nail.  Orthopedic  No limitations of motion  feet .  No crepitus or effusions noted.  HAV  B/L.  Overlapping second toe left foot.  Skin  normotropic skin with no porokeratosis noted bilaterally.  No signs of infections or ulcers noted.   Asymptomatic pinch callus.  Onychomycosis  Pain in right toes  Pain in left toes  Consent was obtained for treatment procedures.   Mechanical debridement of nails 1-5  bilaterally performed with a nail nipper.  Filed with dremel without incident.    Return office visit   3 months                  Told patient to return for periodic foot care and evaluation due to potential at risk complications.   Neyra Pettie DPM  

## 2020-04-18 DIAGNOSIS — F329 Major depressive disorder, single episode, unspecified: Secondary | ICD-10-CM | POA: Diagnosis not present

## 2020-04-18 DIAGNOSIS — D649 Anemia, unspecified: Secondary | ICD-10-CM | POA: Diagnosis not present

## 2020-04-18 DIAGNOSIS — E11319 Type 2 diabetes mellitus with unspecified diabetic retinopathy without macular edema: Secondary | ICD-10-CM | POA: Diagnosis not present

## 2020-04-18 DIAGNOSIS — E785 Hyperlipidemia, unspecified: Secondary | ICD-10-CM | POA: Diagnosis not present

## 2020-04-18 DIAGNOSIS — I10 Essential (primary) hypertension: Secondary | ICD-10-CM | POA: Diagnosis not present

## 2020-04-18 DIAGNOSIS — R82998 Other abnormal findings in urine: Secondary | ICD-10-CM | POA: Diagnosis not present

## 2020-04-18 DIAGNOSIS — N1831 Chronic kidney disease, stage 3a: Secondary | ICD-10-CM | POA: Diagnosis not present

## 2020-04-18 DIAGNOSIS — E1129 Type 2 diabetes mellitus with other diabetic kidney complication: Secondary | ICD-10-CM | POA: Diagnosis not present

## 2020-04-18 DIAGNOSIS — I129 Hypertensive chronic kidney disease with stage 1 through stage 4 chronic kidney disease, or unspecified chronic kidney disease: Secondary | ICD-10-CM | POA: Diagnosis not present

## 2020-04-18 DIAGNOSIS — Z23 Encounter for immunization: Secondary | ICD-10-CM | POA: Diagnosis not present

## 2020-04-18 DIAGNOSIS — M1990 Unspecified osteoarthritis, unspecified site: Secondary | ICD-10-CM | POA: Diagnosis not present

## 2020-04-18 DIAGNOSIS — Z0000 Encounter for general adult medical examination without abnormal findings: Secondary | ICD-10-CM | POA: Diagnosis not present

## 2020-05-03 DIAGNOSIS — H2513 Age-related nuclear cataract, bilateral: Secondary | ICD-10-CM | POA: Diagnosis not present

## 2020-05-03 DIAGNOSIS — H524 Presbyopia: Secondary | ICD-10-CM | POA: Diagnosis not present

## 2020-05-03 DIAGNOSIS — E119 Type 2 diabetes mellitus without complications: Secondary | ICD-10-CM | POA: Diagnosis not present

## 2020-05-30 DIAGNOSIS — N1831 Chronic kidney disease, stage 3a: Secondary | ICD-10-CM | POA: Diagnosis not present

## 2020-05-30 DIAGNOSIS — E1129 Type 2 diabetes mellitus with other diabetic kidney complication: Secondary | ICD-10-CM | POA: Diagnosis not present

## 2020-05-30 DIAGNOSIS — I129 Hypertensive chronic kidney disease with stage 1 through stage 4 chronic kidney disease, or unspecified chronic kidney disease: Secondary | ICD-10-CM | POA: Diagnosis not present

## 2020-05-30 DIAGNOSIS — Z794 Long term (current) use of insulin: Secondary | ICD-10-CM | POA: Diagnosis not present

## 2020-06-25 ENCOUNTER — Encounter: Payer: Self-pay | Admitting: Cardiology

## 2020-06-25 ENCOUNTER — Telehealth: Payer: Self-pay | Admitting: Cardiology

## 2020-06-25 ENCOUNTER — Ambulatory Visit (INDEPENDENT_AMBULATORY_CARE_PROVIDER_SITE_OTHER): Payer: Medicare Other | Admitting: Cardiology

## 2020-06-25 ENCOUNTER — Encounter: Payer: Self-pay | Admitting: *Deleted

## 2020-06-25 VITALS — BP 128/80 | HR 66 | Ht 70.0 in | Wt 179.4 lb

## 2020-06-25 DIAGNOSIS — I429 Cardiomyopathy, unspecified: Secondary | ICD-10-CM | POA: Diagnosis not present

## 2020-06-25 DIAGNOSIS — I259 Chronic ischemic heart disease, unspecified: Secondary | ICD-10-CM | POA: Diagnosis not present

## 2020-06-25 DIAGNOSIS — I480 Paroxysmal atrial fibrillation: Secondary | ICD-10-CM | POA: Diagnosis not present

## 2020-06-25 NOTE — Progress Notes (Signed)
Cardiology Office Note  Date: 06/25/2020   ID: Patrick Miranda, DOB July 26, 1945, MRN [id number]  PCP:  Burnard Bunting, MD  Cardiologist:  Rozann Lesches, MD Electrophysiologist:  None   Chief Complaint  Patient presents with  . Cardiac follow-up    History of Present Illness: [name] is a 74 y.o. male last seen in August by Ms. Bonnell Public PA-C.  He is here today for a follow-up visit.  Reports continued dyspnea on exertion, no longer requiring oxygen or showing any evidence of hypoxemia however.  He does not sense any definite chest tightness, no palpitations.  Previous work-up during diagnosis of COVID-19 and pulmonary emboli back in May was also suggestive of underlying ischemic heart disease.  He has coronary artery calcifications by CT imaging and also had evidence of type II NSTEMI at that time.  We discussed further work-up at this point, he has now reached 6 months treatment on anticoagulation.  We went over his medications which are outlined below.  He does not report any bleeding problems on combination of Plavix and Eliquis.  Past Medical History:  Diagnosis Date  . Arthritis   . 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 Cerebral artery disease   . COVID-07 Dec 2019  . Depression   . Essential hypertension   . Hernia, umbilical   . HOH (hard of hearing)   . Hyperlipidemia   . OSA on CPAP   . Pulmonary emboli [facility])    May 2021  . Type 2 diabetes mellitus ([place])     Past Surgical History:  Procedure Laterality Date  . COLONOSCOPY W/ POLYPECTOMY    . TOTAL KNEE ARTHROPLASTY Left 04/22/2013   Procedure: TOTAL KNEE ARTHROPLASTY- left;  Surgeon: Augustin Schooling, MD;  Location: [facility];  Service: Orthopedics;  Laterality: Left;  with femoral block  . TOTAL KNEE ARTHROPLASTY Right 01/06/2014   Procedure: RIGHT TOTAL KNEE ARTHROPLASTY;  Surgeon: Augustin Schooling, MD;  Location: [facility];  Service: Orthopedics;  Laterality: Right;    Current Outpatient Medications   Medication Sig Dispense Refill  . apixaban (ELIQUIS) 5 MG TABS tablet Take 1 tablet (5 mg total) by mouth 2 (two) times daily. 60 tablet 11  . BD PEN NEEDLE NANO U/F 32G X 4 MM MISC 1 each by Other route daily.     . benzonatate (TESSALON) 100 MG capsule Take 1 capsule (100 mg total) by mouth every 8 (eight) hours. 30 capsule 0  . carvedilol (COREG) 12.5 MG tablet Take 12.5 mg by mouth 2 (two) times daily with a meal.    . clopidogrel (PLAVIX) 75 MG tablet Take 75 mg by mouth daily with breakfast.    . Empagliflozin-Metformin HCl (SYNJARDY) 11-998 MG TABS Take 1 tablet by mouth 2 (two) times daily.    . fenofibrate 160 MG tablet Take 160 mg by mouth daily.    Marland Kitchen HUMALOG KWIKPEN 100 UNIT/ML KwikPen Inject 10 Units into the skin with breakfast, with lunch, and with evening meal.     . ipratropium (ATROVENT) 0.03 % nasal spray 1-2 sprays each nostril every 8-12 hours if needed 30 mL 12  . methylphenidate (RITALIN) 5 MG tablet Take 5 mg by mouth once a week. On Sunday mornings    . ONETOUCH VERIO test strip 1 each by Other route in the morning, at noon, and at bedtime.   3  . pantoprazole (PROTONIX) 40 MG tablet Take 1 tablet (40 mg total) by mouth daily.  30 tablet 6  . rosuvastatin (CRESTOR) 20 MG tablet Take 1 tablet by mouth once a week.    [name] 300 UNIT/ML SOPN Inject 30 Units into the skin daily.      No current facility-administered medications for this visit.   Allergies:  Lipitor [atorvastatin]   ROS: No syncope.  Physical Exam: VS:  BP 128/80   Pulse 66   Ht 5\' 10"  (1.778 m)   Wt 179 lb 6.4 oz (81.4 kg)   SpO2 97%   BMI 25.74 kg/m , BMI Body mass index is 25.74 kg/m.  Wt Readings from Last 3 Encounters:  06/25/20 179 lb 6.4 oz (81.4 kg)  03/19/20 173 lb (78.5 kg)  01/17/20 168 lb 12.8 oz (76.6 kg)    General: Elderly male, appears comfortable at rest. HEENT: Conjunctiva and lids normal, wearing a mask. Neck: Supple, no elevated JVP or carotid bruits, no  thyromegaly. Lungs: Clear to auscultation, nonlabored breathing at rest. Cardiac: Regular rate and rhythm, no S3, soft systolic murmur. Extremities: No pitting edema, distal pulses 2+.  ECG:  An ECG dated 01/17/2020 was personally reviewed today and demonstrated:  Sinus rhythm with IVCD.  Recent Labwork: 12/02/2019: B Natriuretic Peptide 439.8; TSH 1.228 12/07/2019: Magnesium 2.0 12/08/2019: ALT 10; AST 22 03/12/2020: BUN 19; Creatinine, Ser 1.27; Hemoglobin 12.6; Platelets 270; Potassium 4.4; Sodium 138     Component Value Date/Time   TRIG 211 (H) 12/02/2019 1548    Other Studies Reviewed Today:  Echocardiogram 12/03/2019: 1. Mid and basal inferior/posterior lateral wall hypokinesis . Left  ventricular ejection fraction, by estimation, is 50 to 55%. The left  ventricle has low normal function. The left ventricle demonstrates  regional wall motion abnormalities (see scoring  diagram/findings for description). The left ventricular internal cavity  size was mildly dilated. Left ventricular diastolic parameters are  indeterminate.  2. Right ventricular systolic function is normal. The right ventricular  size is normal. There is mildly elevated pulmonary artery systolic  pressure.  3. Right atrial size was mildly dilated.  4. The mitral valve is normal in structure. Mild mitral valve  regurgitation. No evidence of mitral stenosis.  5. The aortic valve is tricuspid. Aortic valve regurgitation is mild.  Mild aortic valve sclerosis is present, with no evidence of aortic valve  stenosis.  6. The inferior vena cava is normal in size with greater than 50%  respiratory variability, suggesting right atrial pressure of 3 mmHg.   Assessment and Plan:  1.  Suspected ischemic heart disease with evidence of coronary artery calcifications by CT imaging and a type II NSTEMI back in May as discussed above.  He reports dyspnea on exertion, no definite chest tightness.  LVEF was 50 to 55% with mid  to basal inferoposterior hypokinesis by echocardiogram in May.  Plan is to proceed with a Lexiscan Myoview to evaluate overall ischemic burden and determine if cardiac catheterization is indicated.  For now, no change in medications.  2.  Multiple subsegmental pulmonary emboli in the setting of COVID-19 pneumonia back in May of this year.  He will complete 6 month course of Eliquis this month.  3.  Transient atrial fibrillation in the setting of problem #2.  CHA2DS2-VASc score is 5, however he has had no further atrial arrhythmias and at this point we will continue with observation once he stops Eliquis as discussed above.  If he has any further arrhythmias, longer-term anticoagulation will need to be pursued.  Medication Adjustments/Labs and Tests Ordered: Current medicines  are reviewed at length with the patient today.  Concerns regarding medicines are outlined above.   Tests Ordered: Orders Placed This Encounter  Procedures  . NM Myocar Multi W/Spect W/Wall Motion / EF    Medication Changes: No orders of the defined types were placed in this encounter.   Disposition:  Follow up test results and determine next step.  Signed, Satira Sark, MD, [facility] 06/25/2020 10:35 AM    [place] [facility] [place], [place], [place] [address] Phone: [phone]; Fax: [phone]

## 2020-06-25 NOTE — Telephone Encounter (Signed)
Pre-cert Verification for the following procedure    LEXISCAN   DATE:   06/28/2020  LOCATION: [facility]

## 2020-06-25 NOTE — Patient Instructions (Addendum)
Medication Instructions:    Your physician recommends that you continue on your current medications as directed. Please refer to the Current Medication list given to you today.  Labwork:  None  Testing/Procedures: Your physician has requested that you have a lexiscan myoview. For further information please visit [email or web address]. Please follow instruction sheet, as given.  Follow-Up:  Your physician recommends that you schedule a follow-up appointment in: pending.  Any Other Special Instructions Will Be Listed Below (If Applicable).  If you need a refill on your cardiac medications before your next appointment, please call your pharmacy.

## 2020-06-26 ENCOUNTER — Encounter: Payer: Self-pay | Admitting: *Deleted

## 2020-06-26 ENCOUNTER — Other Ambulatory Visit: Payer: Self-pay | Admitting: *Deleted

## 2020-06-26 NOTE — Patient Outreach (Signed)
[facility]) Care Management [place], Case closure  06/26/2020  [place] 11-17-1945 [id number]  Successful telephone outreachtospouse/ caregiver([name])of [place], 74 y/o male referred to [facility] CM5/24/21by [facility] liaison after patient experienced recent hospitalization May 14-23, 2038fr shortness of breath with new-onset A-Fib with RVR and pneumonia/ pulmonary emboli/ acute respiratory failure with hypoxia secondary to COVID-19 infection. Patient was discharged home with home oxygen and [facility] services [facility] both of which are no longer active. Patient has history including, but not limited to, DM- TII; CKD- stage II; HTN/ HLD; OSA- uses HS CPAP; and CVA.Prior to this hospitalization, patient has had no other recent hospital visits.  HIPAA/ identity verified and caregiverreports patient "is still doing great;" she denies recent/ current clinical concerns.Reports patient has remained active and independent in all self-care activities.  States patient is not available today, as he is attending a meeting at his church.  Reviewed yesterday's cardiology appointment with spouse; reports no changes or concerns to medications; accurately reports patient to have NM cardiac myoview next week.  Confirms patient is not in A-Fib and has not had any signs/ symptoms of paroxysmal A-Fib episodes.  Confirms he obtained both doses of corona virus vaccine and his flu shot.  Continues driving self.  Caregiver "not sure" of recent blood sugar readings at home- states "they must be good, or he would have told me."  She can not remember if patient has had his updated A1-C completed yet.    Again discussed with caregiver that patient is making great progress in meeting established goals-- discussed possibility of transfer to [place]; caregiver confirms that she mentioned this to patient at time of our last  outreach, and today she declines referral to [place] stating that patient is managing his own care, without supervision, and that she knows patient will not engage with [place]  Caregiver agrees that patient has met his goals and she requests case closure today, stating she will contact [facility] should needs arise in the future.  I confirmed that they havemy direct phone number, the main [facility] office phone number, and the [facility] 24-hour nurse advice phone number should issues arise in the future and patient/ caregiver wish to resume participation in TMoses [place] Northprogram.  Plan:  Will make patient inactive with [facility] CM program, as caregiver verbalizes no ongoing care coordination/ disease management/ pharmacy/ community resource needs and has declined referral to [place] patient's stated behalf; will make patient's PCP aware of same.  LOneta Rack RN, BSN, CIntel CorporationTArkansas State HospitalCare Management  [phone]

## 2020-06-28 ENCOUNTER — Encounter (HOSPITAL_COMMUNITY): Payer: Self-pay

## 2020-06-28 ENCOUNTER — Encounter (HOSPITAL_COMMUNITY)
Admission: RE | Admit: 2020-06-28 | Discharge: 2020-06-28 | Disposition: A | Discharge status: Home / Self Care | Payer: Medicare Other | Source: Ambulatory Visit | Attending: Cardiology | Admitting: Cardiology

## 2020-06-28 ENCOUNTER — Encounter (HOSPITAL_BASED_OUTPATIENT_CLINIC_OR_DEPARTMENT_OTHER)
Admission: RE | Admit: 2020-06-28 | Discharge: 2020-06-28 | Disposition: A | Discharge status: Home / Self Care | Payer: Medicare Other | Source: Ambulatory Visit | Attending: Cardiology | Admitting: Cardiology

## 2020-06-28 ENCOUNTER — Other Ambulatory Visit: Payer: Self-pay

## 2020-06-28 DIAGNOSIS — I259 Chronic ischemic heart disease, unspecified: Secondary | ICD-10-CM | POA: Diagnosis not present

## 2020-06-28 DIAGNOSIS — I429 Cardiomyopathy, unspecified: Secondary | ICD-10-CM | POA: Insufficient documentation

## 2020-06-28 HISTORY — DX: Malignant (primary) neoplasm, unspecified: C80.1

## 2020-06-28 LAB — NM MYOCAR MULTI W/SPECT W/WALL MOTION / EF
LV dias vol: 143 mL (ref 62–150)
LV sys vol: 75 mL
Peak HR: 90 {beats}/min
RATE: 0.3
Rest HR: 57 {beats}/min
SDS: 1
SRS: 13
SSS: 14
TID: 1.1

## 2020-06-28 MED ORDER — SODIUM CHLORIDE FLUSH 0.9 % IV SOLN
INTRAVENOUS | Status: AC
  Administered 2020-06-28: 10 mL via INTRAVENOUS
  Filled 2020-06-28: qty 10

## 2020-06-28 MED ORDER — TECHNETIUM TC 99M TETROFOSMIN IV KIT
30.0000 | PACK | Freq: Once | INTRAVENOUS | Status: AC | PRN
  Administered 2020-06-28: 31.5 via INTRAVENOUS

## 2020-06-28 MED ORDER — REGADENOSON 0.4 MG/5ML IV SOLN
INTRAVENOUS | Status: AC
  Administered 2020-06-28: 0.4 mg via INTRAVENOUS
  Filled 2020-06-28: qty 5

## 2020-06-28 MED ORDER — TECHNETIUM TC 99M TETROFOSMIN IV KIT
10.0000 | PACK | Freq: Once | INTRAVENOUS | Status: AC | PRN
  Administered 2020-06-28: 10.7 via INTRAVENOUS

## 2020-06-29 ENCOUNTER — Telehealth: Payer: Self-pay | Admitting: *Deleted

## 2020-06-29 NOTE — Telephone Encounter (Signed)
-----   Message from Satira Sark, MD sent at 06/28/2020  3:35 PM EST ----- Results reviewed.  Intermediate risk Myoview with evidence of inferior scar and moderate peri-infarct ischemia, mildly reduced LVEF.  Patient was just seen in the office this week, please see full note.  If we can have him come in and see Jonni Sanger to set up a cardiac catheterization that would be my next recommended step to evaluate his coronaries.  He is at a point that he could stop Eliquis now that he is 6 months out from treatment of pulmonary embolus.

## 2020-06-29 NOTE — Telephone Encounter (Signed)
Patient informed and verbalized understanding of plan. Copy sent to PCP 

## 2020-07-01 NOTE — Progress Notes (Signed)
Cardiology Office Note  Date: 07/02/2020   ID: Patrick Miranda, DOB Jul 21, 1946, MRN [id number]  PCP:  Burnard Bunting, MD  Cardiologist:  Rozann Lesches, MD Electrophysiologist:  None   Chief Complaint: Follow-up ischemic heart disease  History of Present Illness: Patrick Miranda is a 74 y.o. male with a history of CAD, CVA, OSA, DVT/PE, HTN, DM 2. New onset atrial fibrillation.  Last encounter with Dr. Domenic Polite 06/25/2020.  He reported continued dyspnea on exertion.  No longer requiring oxygen.  Previous work-up in May secondary to Covid virus/PE.  History of coronary calcifications by CT with evidence of type II NSTEMI.  Further work-up was discussed.  He was reaching 6 months treatment on anticoagulation for PE.  No reports of bleeding issues. CHA2DS2-VASc score 5.  No further episodes of atrial arrhythmias.  Plan was to proceed with Lexiscan Myoview to evaluate overall ischemic burden and determine if cardiac catheterization was indicated.   Patient is here today for follow-up on his stress test results.  He continues to complain of dyspnea on exertion.  Denies any classic anginal symptoms such as chest pain, pressure, tightness, neck, arm, back, or jaw pain.  Denies any associated nausea, vomiting, or diaphoresis.  He had 2 siblings both of which have had intervention for coronary artery disease and MIs.  We discussed the results of his stress test and he verbalizes understanding of the results.  He states in the interim he has stopped his Eliquis secondary to PE in May of this year.  He denies any palpitations or arrhythmias, orthostatic symptoms other than feeling somewhat dizzy yesterday morning when he arose.  He denied any near syncopal or syncopal episode.  Otherwise he denies any CVA or TIA-like symptoms, PND, orthopnea, bleeding issues, DVT or PE-like symptoms, lower extremity edema   Past Medical History:  Diagnosis Date  . Arthritis   . 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 Cancer (HCC)    Skin  . Cerebral artery disease   . COVID-07 Dec 2019  . Depression   . Essential hypertension   . Hernia, umbilical   . HOH (hard of hearing)   . Hyperlipidemia   . OSA on CPAP   . Pulmonary emboli [facility])    May 2021  . Type 2 diabetes mellitus ([place])     Past Surgical History:  Procedure Laterality Date  . COLONOSCOPY W/ POLYPECTOMY    . TOTAL KNEE ARTHROPLASTY Left 04/22/2013   Procedure: TOTAL KNEE ARTHROPLASTY- left;  Surgeon: Augustin Schooling, MD;  Location: [facility];  Service: Orthopedics;  Laterality: Left;  with femoral block  . TOTAL KNEE ARTHROPLASTY Right 01/06/2014   Procedure: RIGHT TOTAL KNEE ARTHROPLASTY;  Surgeon: Augustin Schooling, MD;  Location: [place];  Service: Orthopedics;  Laterality: Right;    Current Outpatient Medications  Medication Sig Dispense Refill  . BD PEN NEEDLE NANO U/F 32G X 4 MM MISC 1 each by Other route daily.     . benzonatate (TESSALON) 100 MG capsule Take 1 capsule (100 mg total) by mouth every 8 (eight) hours. 30 capsule 0  . carvedilol (COREG) 12.5 MG tablet Take 12.5 mg by mouth 2 (two) times daily with a meal.    . clopidogrel (PLAVIX) 75 MG tablet Take 75 mg by mouth daily with breakfast.    . Empagliflozin-metFORMIN HCl 11-998 MG TABS Take 1 tablet by mouth 2 (two) times daily.    . fenofibrate 160 MG tablet Take 160  mg by mouth daily.    Marland Kitchen HUMALOG KWIKPEN 100 UNIT/ML KwikPen Inject 10 Units into the skin with breakfast, with lunch, and with evening meal.     . ipratropium (ATROVENT) 0.03 % nasal spray 1-2 sprays each nostril every 8-12 hours if needed 30 mL 12  . methylphenidate (RITALIN) 5 MG tablet Take 5 mg by mouth once a week. On Sunday mornings    . ONETOUCH VERIO test strip 1 each by Other route in the morning, at noon, and at bedtime.   3  . rosuvastatin (CRESTOR) 20 MG tablet Take 1 tablet by mouth once a week.    [name] 300 UNIT/ML SOPN Inject 30 Units into the skin daily.      No current  facility-administered medications for this visit.   Allergies:  Lipitor [atorvastatin]   Social History: The patient  reports that he has never smoked. He has never used smokeless tobacco. He reports that he does not drink alcohol and does not use drugs.   Family History: The patient's family history includes CAD in his brother.   ROS:  Please see the history of present illness. Otherwise, complete review of systems is positive for none.  All other systems are reviewed and negative.   Physical Exam: VS:  BP 140/70   Pulse (!) 58   Ht 5' 10.5" (1.791 m)   Wt 179 lb (81.2 kg)   SpO2 97%   BMI 25.32 kg/m , BMI Body mass index is 25.32 kg/m.  Wt Readings from Last 3 Encounters:  07/02/20 179 lb (81.2 kg)  06/25/20 179 lb 6.4 oz (81.4 kg)  03/19/20 173 lb (78.5 kg)    General: Patient appears comfortable at rest. Neck: Supple, no elevated JVP or carotid bruits, no thyromegaly. Lungs: Clear to auscultation, nonlabored breathing at rest. Cardiac: Regular rate and rhythm, no S3 or significant systolic murmur, no pericardial rub. Extremities: No pitting edema, distal pulses 2+. Skin: Warm and dry. Musculoskeletal: No kyphosis. Neuropsychiatric: Alert and oriented x3, affect grossly appropriate.  ECG:  An ECG dated 07/02/2020 was personally reviewed today and demonstrated:  Sinus bradycardia with occasional premature ventricular complexes rate of 59, nonspecific intraventricular block, T wave abnormality consider inferior ischemia.  Recent Labwork: 12/02/2019: B Natriuretic Peptide 439.8; TSH 1.228 12/07/2019: Magnesium 2.0 12/08/2019: ALT 10; AST 22 03/12/2020: BUN 19; Creatinine, Ser 1.27; Hemoglobin 12.6; Platelets 270; Potassium 4.4; Sodium 138     Component Value Date/Time   TRIG 211 (H) 12/02/2019 1548    Other Studies Reviewed Today:  Echocardiogram 12/03/2019: 1. Mid and basal inferior/posterior lateral wall hypokinesis . Left  ventricular ejection fraction, by  estimation, is 50 to 55%. The left  ventricle has low normal function. The left ventricle demonstrates  regional wall motion abnormalities (see scoring  diagram/findings for description). The left ventricular internal cavity  size was mildly dilated. Left ventricular diastolic parameters are  indeterminate.  2. Right ventricular systolic function is normal. The right ventricular  size is normal. There is mildly elevated pulmonary artery systolic  pressure.  3. Right atrial size was mildly dilated.  4. The mitral valve is normal in structure. Mild mitral valve  regurgitation. No evidence of mitral stenosis.  5. The aortic valve is tricuspid. Aortic valve regurgitation is mild.  Mild aortic valve sclerosis is present, with no evidence of aortic valve  stenosis.  6. The inferior vena cava is normal in size with greater than 50%  respiratory variability, suggesting right atrial pressure of 3  mmHg.    NST 06/28/2020 Study Result  Narrative & Impression   No diagnostic ST segment changes with IVCD at baseline. Occasional to frequent PVCs.  Moderate sized, severe intensity, apical to basal inferior defect that is partially reversible at the apex predominantly consistent with scar and moderate peri-infarct ischemia.  This is an intermediate risk study.  Nuclear stress EF: 48%.    Chest CT 12/02/2019 IMPRESSION: 1. Partially occlusive thrombus within the bifurcation of the segmental anterior posterior right lower lobe pulmonary arteries as well as within the subsegmental anterior left lower lobe pulmonary arterial. 2. No evidence of right ventricular heart strain. 3. Multifocal patchy airspace opacities throughout both lungs, likely consistent with multifocal pneumonia 4. Mild aortic Atherosclerosis (ICD10-I70.0).    Assessment and Plan:  1. Abnormal stress test   2. DOE (dyspnea on exertion)   3. History of pulmonary embolism   4. Atrial fibrillation, unspecified type  ([place])   5. Mixed hyperlipidemia   6. Essential hypertension    1. Abnormal stress test Recent abnormal stress test as noted above.  Continue to complain of dyspnea on exertion.  Dr. Domenic Polite has recommended having a cardiac catheterization to further evaluate ischemia.  Discussed risk and benefits at length with patient and he verbalizes understanding.  Patient agrees to proceed with cardiac catheterization.  Continue Plavix 75 mg daily.   Cardiac catheterization is scheduled for 07/04/2020 at 1131 Dr. Claiborne Billings.  Precath orders have been placed.  2. DOE (dyspnea on exertion) Continues with dyspnea on exertion.  This is most likely an anginal equivalent.  See #1 above.  Plans are for cardiac catheterization to further investigate ischemia noted on abnormal stress test above.  3. History of pulmonary embolism Patient has completed 6 months of anticoagulation on Eliquis for DVT/PE secondary to Covid infection in May.  States he has already stopped the medication.  4. Atrial fibrillation, unspecified type [facility]) Patient had atrial fibrillation during Covid virus infection.  Denies any current palpitations or arrhythmias.  EKG today shows sinus bradycardia with occasional premature ventricular complexes, nonspecific intraventricular block.  T wave abnormality consider inferior ischemia, rate of 59.  5.  Hyperlipidemia.  Continue Crestor 20 mg p.o. twice daily.  Continue fenofibric 160 mg p.o. daily.  6.  Hypertension Blood pressure elevated today on arrival.  Continue carvedilol 12.5 mg p.o. twice daily.  Medication Adjustments/Labs and Tests Ordered: Current medicines are reviewed at length with the patient today.  Concerns regarding medicines are outlined above.   Disposition: Follow-up with Dr. Domenic Polite or APP 1 month after cardiac catheterization  Signed, Levell July, NP 07/02/2020 9:10 AM    [facility] at [place], [place], [place] [address] Phone: [phone]; Fax: [phone]

## 2020-07-02 ENCOUNTER — Other Ambulatory Visit (HOSPITAL_COMMUNITY)
Admission: RE | Admit: 2020-07-02 | Discharge: 2020-07-02 | Disposition: A | Discharge status: Home / Self Care | Payer: Medicare Other | Source: Ambulatory Visit | Attending: Cardiovascular Disease | Admitting: Cardiovascular Disease

## 2020-07-02 ENCOUNTER — Telehealth: Payer: Self-pay | Admitting: Family Medicine

## 2020-07-02 ENCOUNTER — Other Ambulatory Visit (HOSPITAL_COMMUNITY)
Admission: RE | Admit: 2020-07-02 | Discharge: 2020-07-02 | Disposition: A | Discharge status: Home / Self Care | Payer: Medicare Other | Source: Ambulatory Visit | Attending: Family Medicine | Admitting: Family Medicine

## 2020-07-02 ENCOUNTER — Encounter: Payer: Self-pay | Admitting: Family Medicine

## 2020-07-02 ENCOUNTER — Other Ambulatory Visit: Payer: Self-pay

## 2020-07-02 ENCOUNTER — Ambulatory Visit (INDEPENDENT_AMBULATORY_CARE_PROVIDER_SITE_OTHER): Payer: Medicare Other | Admitting: Family Medicine

## 2020-07-02 VITALS — BP 140/70 | HR 58 | Ht 70.5 in | Wt 179.0 lb

## 2020-07-02 DIAGNOSIS — Z86711 Personal history of pulmonary embolism: Secondary | ICD-10-CM

## 2020-07-02 DIAGNOSIS — R9439 Abnormal result of other cardiovascular function study: Secondary | ICD-10-CM

## 2020-07-02 DIAGNOSIS — R0609 Other forms of dyspnea: Secondary | ICD-10-CM

## 2020-07-02 DIAGNOSIS — I10 Essential (primary) hypertension: Secondary | ICD-10-CM

## 2020-07-02 DIAGNOSIS — Z20822 Contact with and (suspected) exposure to covid-19: Secondary | ICD-10-CM | POA: Insufficient documentation

## 2020-07-02 DIAGNOSIS — Z01812 Encounter for preprocedural laboratory examination: Secondary | ICD-10-CM | POA: Insufficient documentation

## 2020-07-02 DIAGNOSIS — I4891 Unspecified atrial fibrillation: Secondary | ICD-10-CM | POA: Insufficient documentation

## 2020-07-02 DIAGNOSIS — E782 Mixed hyperlipidemia: Secondary | ICD-10-CM | POA: Diagnosis not present

## 2020-07-02 DIAGNOSIS — R0600 Dyspnea, unspecified: Secondary | ICD-10-CM | POA: Diagnosis not present

## 2020-07-02 LAB — BASIC METABOLIC PANEL
Anion gap: 8 (ref 5–15)
BUN: 18 mg/dL (ref 8–23)
CO2: 25 mmol/L (ref 22–32)
Calcium: 8.9 mg/dL (ref 8.9–10.3)
Chloride: 104 mmol/L (ref 98–111)
Creatinine, Ser: 1.33 mg/dL — ABNORMAL HIGH (ref 0.61–1.24)
GFR, Estimated: 56 mL/min — ABNORMAL LOW (ref >60)
Glucose, Bld: 198 mg/dL — ABNORMAL HIGH (ref 70–99)
Potassium: 3.5 mmol/L (ref 3.5–5.1)
Sodium: 137 mmol/L (ref 135–145)

## 2020-07-02 LAB — CBC
HCT: 39.1 % (ref 39.0–52.0)
Hemoglobin: 12.1 g/dL — ABNORMAL LOW (ref 13.0–17.0)
MCH: 28.7 pg (ref 26.0–34.0)
MCHC: 30.9 g/dL (ref 30.0–36.0)
MCV: 92.7 fL (ref 80.0–100.0)
Platelets: 327 10*3/uL (ref 150–400)
RBC: 4.22 MIL/uL (ref 4.22–5.81)
RDW: 15.2 % (ref 11.5–15.5)
WBC: 7.6 10*3/uL (ref 4.0–10.5)
nRBC: 0 % (ref 0.0–0.2)

## 2020-07-02 NOTE — Telephone Encounter (Signed)
Pre-cert Verification for the following procedure     [facility] 12/15 @11 :30AM DR KELLY [facility] 12/15 @11 :30AM DR Claiborne Billings

## 2020-07-02 NOTE — Patient Instructions (Signed)
Your physician recommends that you schedule a follow-up appointment in: [place], NP  Your physician recommends that you continue on your current medications as directed. Please refer to the Current Medication list given to you today.     [place] [address] Dept: [phone] Loc: [place]  07/02/2020  You are scheduled for a Cardiac Catheterization on Wednesday, December 15 with Dr. Shelva Majestic.  1. Please arrive at the [facility] (Main Entrance A) at [facility]: [address] [place], [place] [address] at 9:30 AM (This time is two hours before your procedure to ensure your preparation). Free valet parking service is available.   Special note: Every effort is made to have your procedure done on time. Please understand that emergencies sometimes delay scheduled procedures.  2. Diet: Do not eat solid foods after midnight.  The patient may have clear liquids until 5am upon the day of the procedure.  3. Labs: You will need to have blood drawn TODAY AT [place] TEST AT 11:25AM You do not need to be fasting.  4. Medication instructions in preparation for your procedure:  HOLD DIABETIC MEDICATIONS THE DAY BEFORE AND 2 DAYS AFTER YOUR PROCEDURE  On the morning of your procedure, and any morning medicines NOT listed above.  You may use sips of water.  5. Plan for one night stay--bring personal belongings. 6. Bring a current list of your medications and current insurance cards. 7. You MUST have a responsible person to drive you home. 8. Someone MUST be with you the first 24 hours after you arrive home or your discharge will be delayed. 9. Please wear clothes that are easy to get on and off and wear slip-on shoes.  Thank you for allowing [place] to care for you!   -- [facility]

## 2020-07-02 NOTE — Telephone Encounter (Signed)
Reports calling to confirm medications he takes. Medications reviewed during visit today

## 2020-07-02 NOTE — Telephone Encounter (Signed)
Pt c/o medication issue:  1. Name of Medication:  carvedilol (COREG) 12.5 MG tablet clopidogrel (PLAVIX) 75 MG tablet fenofibrate 160 MG tablet Empagliflozin-metFORMIN HCl 11-998 MG TABS TOUJEO SOLOSTAR 300 UNIT/ML SOPN HUMALOG KWIKPEN 100 UNIT/ML KwikPen  2. How are you currently taking this medication (dosage and times per day)?  Carvedilol: 1 tablet twice a day, plavix: 1 tablet daily, fenofibrate: 1 tablet daily, Empagliflozin-metformin: 1 tablet twice a day, toujeo solostar: 30 units daily, humalog kwikpen: 10 unites with each meal.  3. Are you having a reaction (difficulty breathing--STAT)? no  4. What is your medication issue? Patient calling with the medications he is currently taking.

## 2020-07-03 ENCOUNTER — Telehealth: Payer: Self-pay | Admitting: *Deleted

## 2020-07-03 LAB — SARS CORONAVIRUS 2 (TAT 6-24 HRS): SARS Coronavirus 2: NEGATIVE

## 2020-07-03 NOTE — Telephone Encounter (Signed)
Pt contacted pre-catheterization scheduled at [facility] for: Wednesday July 04, 2020 11:30 AM Verified arrival time and place: [place] [facility]) at: 9:30 AM   No solid food after midnight prior to cath, clear liquids until 5 AM day of procedure.  Hold: Empagliflozin-metformin (Synjardy) -day of procedure and 48 hours post procedure Insulin-AM of procedure  Expect hold medications AM meds can be  taken pre-cath with sips of water including: ASA 81 mg Plavix 75 mg   Confirmed patient has responsible adult to drive home post procedure and be with patient first 24 hours after arriving home: yes  You are allowed ONE visitor in the waiting room during the time you are at the hospital for your procedure. Both you and your visitor must wear a mask once you enter the hospital.       COVID-19 Pre-Screening Questions:  . In the past 14 days have you had any symptoms concerning for COVID-19 infection (fever, chills, cough, or new shortness of breath)? no . In the past 14 days have you been around anyone with known Covid 19? no   Reviewed procedure/mask/visitor instructions, COVID-19 questions with patient.

## 2020-07-04 ENCOUNTER — Encounter (HOSPITAL_COMMUNITY)
Admission: RE | Disposition: A | Discharge status: Home Health | Payer: Self-pay | Source: Home / Self Care | Attending: Cardiothoracic Surgery

## 2020-07-04 ENCOUNTER — Inpatient Hospital Stay (HOSPITAL_COMMUNITY)
Admission: RE | Admit: 2020-07-04 | Discharge: 2020-07-16 | DRG: 234 | Disposition: A | Discharge status: Home Health | Payer: Medicare Other | Attending: Cardiothoracic Surgery | Admitting: Cardiothoracic Surgery

## 2020-07-04 ENCOUNTER — Other Ambulatory Visit: Payer: Self-pay

## 2020-07-04 ENCOUNTER — Other Ambulatory Visit: Payer: Self-pay | Admitting: *Deleted

## 2020-07-04 DIAGNOSIS — J984 Other disorders of lung: Secondary | ICD-10-CM | POA: Diagnosis not present

## 2020-07-04 DIAGNOSIS — Z96653 Presence of artificial knee joint, bilateral: Secondary | ICD-10-CM | POA: Diagnosis present

## 2020-07-04 DIAGNOSIS — Z79899 Other long term (current) drug therapy: Secondary | ICD-10-CM

## 2020-07-04 DIAGNOSIS — I214 Non-ST elevation (NSTEMI) myocardial infarction: Secondary | ICD-10-CM

## 2020-07-04 DIAGNOSIS — Z9889 Other specified postprocedural states: Secondary | ICD-10-CM

## 2020-07-04 DIAGNOSIS — Z951 Presence of aortocoronary bypass graft: Secondary | ICD-10-CM

## 2020-07-04 DIAGNOSIS — R079 Chest pain, unspecified: Secondary | ICD-10-CM

## 2020-07-04 DIAGNOSIS — G4733 Obstructive sleep apnea (adult) (pediatric): Secondary | ICD-10-CM | POA: Diagnosis not present

## 2020-07-04 DIAGNOSIS — D631 Anemia in chronic kidney disease: Secondary | ICD-10-CM | POA: Diagnosis not present

## 2020-07-04 DIAGNOSIS — R9439 Abnormal result of other cardiovascular function study: Secondary | ICD-10-CM | POA: Diagnosis not present

## 2020-07-04 DIAGNOSIS — J939 Pneumothorax, unspecified: Secondary | ICD-10-CM | POA: Diagnosis not present

## 2020-07-04 DIAGNOSIS — Z86711 Personal history of pulmonary embolism: Secondary | ICD-10-CM

## 2020-07-04 DIAGNOSIS — I2510 Atherosclerotic heart disease of native coronary artery without angina pectoris: Secondary | ICD-10-CM

## 2020-07-04 DIAGNOSIS — Z20822 Contact with and (suspected) exposure to covid-19: Secondary | ICD-10-CM | POA: Diagnosis not present

## 2020-07-04 DIAGNOSIS — Z888 Allergy status to other drugs, medicaments and biological substances status: Secondary | ICD-10-CM | POA: Diagnosis not present

## 2020-07-04 DIAGNOSIS — I454 Nonspecific intraventricular block: Secondary | ICD-10-CM | POA: Diagnosis present

## 2020-07-04 DIAGNOSIS — N1831 Chronic kidney disease, stage 3a: Secondary | ICD-10-CM | POA: Diagnosis not present

## 2020-07-04 DIAGNOSIS — J811 Chronic pulmonary edema: Secondary | ICD-10-CM | POA: Diagnosis not present

## 2020-07-04 DIAGNOSIS — I129 Hypertensive chronic kidney disease with stage 1 through stage 4 chronic kidney disease, or unspecified chronic kidney disease: Secondary | ICD-10-CM | POA: Diagnosis not present

## 2020-07-04 DIAGNOSIS — R0602 Shortness of breath: Secondary | ICD-10-CM | POA: Diagnosis not present

## 2020-07-04 DIAGNOSIS — Z8673 Personal history of transient ischemic attack (TIA), and cerebral infarction without residual deficits: Secondary | ICD-10-CM

## 2020-07-04 DIAGNOSIS — I480 Paroxysmal atrial fibrillation: Secondary | ICD-10-CM | POA: Diagnosis present

## 2020-07-04 DIAGNOSIS — Z794 Long term (current) use of insulin: Secondary | ICD-10-CM

## 2020-07-04 DIAGNOSIS — J9811 Atelectasis: Secondary | ICD-10-CM | POA: Diagnosis not present

## 2020-07-04 DIAGNOSIS — J8489 Other specified interstitial pulmonary diseases: Secondary | ICD-10-CM | POA: Diagnosis not present

## 2020-07-04 DIAGNOSIS — E1122 Type 2 diabetes mellitus with diabetic chronic kidney disease: Secondary | ICD-10-CM | POA: Diagnosis present

## 2020-07-04 DIAGNOSIS — M1990 Unspecified osteoarthritis, unspecified site: Secondary | ICD-10-CM | POA: Diagnosis present

## 2020-07-04 DIAGNOSIS — I517 Cardiomegaly: Secondary | ICD-10-CM | POA: Diagnosis not present

## 2020-07-04 DIAGNOSIS — Z8249 Family history of ischemic heart disease and other diseases of the circulatory system: Secondary | ICD-10-CM

## 2020-07-04 DIAGNOSIS — E785 Hyperlipidemia, unspecified: Secondary | ICD-10-CM | POA: Diagnosis not present

## 2020-07-04 DIAGNOSIS — D689 Coagulation defect, unspecified: Secondary | ICD-10-CM | POA: Diagnosis not present

## 2020-07-04 DIAGNOSIS — R0600 Dyspnea, unspecified: Secondary | ICD-10-CM | POA: Diagnosis not present

## 2020-07-04 DIAGNOSIS — Z9689 Presence of other specified functional implants: Secondary | ICD-10-CM

## 2020-07-04 DIAGNOSIS — H9190 Unspecified hearing loss, unspecified ear: Secondary | ICD-10-CM | POA: Diagnosis present

## 2020-07-04 DIAGNOSIS — M19011 Primary osteoarthritis, right shoulder: Secondary | ICD-10-CM | POA: Diagnosis not present

## 2020-07-04 DIAGNOSIS — I083 Combined rheumatic disorders of mitral, aortic and tricuspid valves: Secondary | ICD-10-CM | POA: Diagnosis not present

## 2020-07-04 DIAGNOSIS — Z419 Encounter for procedure for purposes other than remedying health state, unspecified: Secondary | ICD-10-CM

## 2020-07-04 DIAGNOSIS — D62 Acute posthemorrhagic anemia: Secondary | ICD-10-CM | POA: Diagnosis not present

## 2020-07-04 DIAGNOSIS — Z01810 Encounter for preprocedural cardiovascular examination: Secondary | ICD-10-CM | POA: Diagnosis not present

## 2020-07-04 DIAGNOSIS — Z8616 Personal history of COVID-19: Secondary | ICD-10-CM | POA: Diagnosis not present

## 2020-07-04 DIAGNOSIS — N182 Chronic kidney disease, stage 2 (mild): Secondary | ICD-10-CM | POA: Diagnosis not present

## 2020-07-04 DIAGNOSIS — J90 Pleural effusion, not elsewhere classified: Secondary | ICD-10-CM | POA: Diagnosis not present

## 2020-07-04 HISTORY — DX: Atherosclerotic heart disease of native coronary artery without angina pectoris: I25.10

## 2020-07-04 HISTORY — PX: LEFT HEART CATH AND CORONARY ANGIOGRAPHY: CATH118249

## 2020-07-04 LAB — CBC
HCT: 37.1 % — ABNORMAL LOW (ref 39.0–52.0)
Hemoglobin: 11.8 g/dL — ABNORMAL LOW (ref 13.0–17.0)
MCH: 28.7 pg (ref 26.0–34.0)
MCHC: 31.8 g/dL (ref 30.0–36.0)
MCV: 90.3 fL (ref 80.0–100.0)
Platelets: 300 10*3/uL (ref 150–400)
RBC: 4.11 MIL/uL — ABNORMAL LOW (ref 4.22–5.81)
RDW: 15 % (ref 11.5–15.5)
WBC: 6.4 10*3/uL (ref 4.0–10.5)
nRBC: 0 % (ref 0.0–0.2)

## 2020-07-04 LAB — URINALYSIS, ROUTINE W REFLEX MICROSCOPIC
Bacteria, UA: NONE SEEN
Bilirubin Urine: NEGATIVE
Glucose, UA: >=500 mg/dL — AB
Hgb urine dipstick: NEGATIVE
Ketones, ur: NEGATIVE mg/dL
Leukocytes,Ua: NEGATIVE
Nitrite: NEGATIVE
Protein, ur: NEGATIVE mg/dL
Specific Gravity, Urine: 1.03 (ref 1.005–1.030)
pH: 7 (ref 5.0–8.0)

## 2020-07-04 LAB — GLUCOSE, CAPILLARY
Glucose-Capillary: 123 mg/dL — ABNORMAL HIGH (ref 70–99)
Glucose-Capillary: 92 mg/dL (ref 70–99)
Glucose-Capillary: 79 mg/dL (ref 70–99)

## 2020-07-04 LAB — CREATININE, SERUM
Creatinine, Ser: 1.25 mg/dL — ABNORMAL HIGH (ref 0.61–1.24)
GFR, Estimated: >60 mL/min (ref >60)

## 2020-07-04 LAB — SURGICAL PCR SCREEN
MRSA, PCR: NEGATIVE
Staphylococcus aureus: NEGATIVE

## 2020-07-04 SURGERY — LEFT HEART CATH AND CORONARY ANGIOGRAPHY
Anesthesia: LOCAL

## 2020-07-04 MED ORDER — MIDAZOLAM HCL 2 MG/2ML IJ SOLN
INTRAMUSCULAR | Status: DC | PRN
  Administered 2020-07-04: 2 mg via INTRAVENOUS

## 2020-07-04 MED ORDER — ISOSORBIDE MONONITRATE ER 30 MG PO TB24
30.0000 mg | ORAL_TABLET | Freq: Every day | ORAL | Status: DC
  Administered 2020-07-04 – 2020-07-08 (×5): 30 mg via ORAL
  Filled 2020-07-04 (×5): qty 1

## 2020-07-04 MED ORDER — VERAPAMIL HCL 2.5 MG/ML IV SOLN
INTRAVENOUS | Status: AC
  Filled 2020-07-04: qty 2

## 2020-07-04 MED ORDER — SODIUM CHLORIDE 0.9 % WEIGHT BASED INFUSION
3.0000 mL/kg/h | INTRAVENOUS | Status: DC
  Administered 2020-07-04: 3 mL/kg/h via INTRAVENOUS

## 2020-07-04 MED ORDER — FENTANYL CITRATE (PF) 100 MCG/2ML IJ SOLN
INTRAMUSCULAR | Status: AC
  Filled 2020-07-04: qty 2

## 2020-07-04 MED ORDER — LABETALOL HCL 5 MG/ML IV SOLN: 10.0000 mg | INTRAVENOUS | Status: AC | PRN

## 2020-07-04 MED ORDER — SODIUM CHLORIDE 0.9% FLUSH: 3.0000 mL | INTRAVENOUS | Status: DC | PRN

## 2020-07-04 MED ORDER — HYDRALAZINE HCL 20 MG/ML IJ SOLN: 10.0000 mg | INTRAMUSCULAR | Status: AC | PRN

## 2020-07-04 MED ORDER — DIAZEPAM 5 MG PO TABS: 5.0000 mg | ORAL_TABLET | Freq: Four times a day (QID) | ORAL | Status: DC | PRN

## 2020-07-04 MED ORDER — HEPARIN (PORCINE) IN NACL 1000-0.9 UT/500ML-% IV SOLN
INTRAVENOUS | Status: AC
  Filled 2020-07-04: qty 500

## 2020-07-04 MED ORDER — SODIUM CHLORIDE 0.9 % IV SOLN: 250.0000 mL | INTRAVENOUS | Status: DC | PRN

## 2020-07-04 MED ORDER — CARVEDILOL 12.5 MG PO TABS
12.5000 mg | ORAL_TABLET | Freq: Two times a day (BID) | ORAL | Status: DC
  Administered 2020-07-05 – 2020-07-08 (×8): 12.5 mg via ORAL
  Filled 2020-07-04 (×9): qty 1

## 2020-07-04 MED ORDER — MIDAZOLAM HCL 2 MG/2ML IJ SOLN
INTRAMUSCULAR | Status: AC
  Filled 2020-07-04: qty 2

## 2020-07-04 MED ORDER — ACETAMINOPHEN 325 MG PO TABS
650.0000 mg | ORAL_TABLET | ORAL | Status: DC | PRN
  Administered 2020-07-05: 650 mg via ORAL
  Filled 2020-07-04: qty 2

## 2020-07-04 MED ORDER — ASPIRIN 81 MG PO CHEW: 81.0000 mg | CHEWABLE_TABLET | ORAL | Status: DC

## 2020-07-04 MED ORDER — IOHEXOL 350 MG/ML SOLN
INTRAVENOUS | Status: DC | PRN
  Administered 2020-07-04: 85 mL via INTRA_ARTERIAL

## 2020-07-04 MED ORDER — ONDANSETRON HCL 4 MG/2ML IJ SOLN: 4.0000 mg | Freq: Four times a day (QID) | INTRAMUSCULAR | Status: DC | PRN

## 2020-07-04 MED ORDER — SODIUM CHLORIDE 0.9 % IV SOLN: INTRAVENOUS | Status: AC

## 2020-07-04 MED ORDER — SODIUM CHLORIDE 0.9% FLUSH
3.0000 mL | Freq: Two times a day (BID) | INTRAVENOUS | Status: DC
  Administered 2020-07-05 – 2020-07-08 (×8): 3 mL via INTRAVENOUS

## 2020-07-04 MED ORDER — SODIUM CHLORIDE 0.9 % WEIGHT BASED INFUSION: 1.0000 mL/kg/h | INTRAVENOUS | Status: DC

## 2020-07-04 MED ORDER — LIDOCAINE HCL (PF) 1 % IJ SOLN
INTRAMUSCULAR | Status: DC | PRN
  Administered 2020-07-04: 2 mL

## 2020-07-04 MED ORDER — HEPARIN (PORCINE) IN NACL 1000-0.9 UT/500ML-% IV SOLN
INTRAVENOUS | Status: DC | PRN
  Administered 2020-07-04 (×3): 500 mL

## 2020-07-04 MED ORDER — LIDOCAINE HCL (PF) 1 % IJ SOLN
INTRAMUSCULAR | Status: AC
  Filled 2020-07-04: qty 30

## 2020-07-04 MED ORDER — ROSUVASTATIN CALCIUM 20 MG PO TABS
20.0000 mg | ORAL_TABLET | Freq: Every day | ORAL | Status: DC
  Administered 2020-07-04 – 2020-07-08 (×5): 20 mg via ORAL
  Filled 2020-07-04 (×5): qty 1

## 2020-07-04 MED ORDER — HEPARIN SODIUM (PORCINE) 1000 UNIT/ML IJ SOLN
INTRAMUSCULAR | Status: AC
  Filled 2020-07-04: qty 1

## 2020-07-04 MED ORDER — ASPIRIN 81 MG PO CHEW
81.0000 mg | CHEWABLE_TABLET | Freq: Every day | ORAL | Status: DC
  Administered 2020-07-04 – 2020-07-08 (×5): 81 mg via ORAL
  Filled 2020-07-04 (×5): qty 1

## 2020-07-04 MED ORDER — FENTANYL CITRATE (PF) 100 MCG/2ML IJ SOLN
INTRAMUSCULAR | Status: DC | PRN
  Administered 2020-07-04: 25 ug via INTRAVENOUS

## 2020-07-04 MED ORDER — VERAPAMIL HCL 2.5 MG/ML IV SOLN
INTRAVENOUS | Status: DC | PRN
  Administered 2020-07-04: 10 mL via INTRA_ARTERIAL

## 2020-07-04 MED ORDER — ENOXAPARIN SODIUM 40 MG/0.4ML ~~LOC~~ SOLN
40.0000 mg | SUBCUTANEOUS | Status: DC
  Administered 2020-07-05 – 2020-07-08 (×4): 40 mg via SUBCUTANEOUS
  Filled 2020-07-04 (×4): qty 0.4

## 2020-07-04 MED ORDER — SODIUM CHLORIDE 0.9% FLUSH: 3.0000 mL | Freq: Two times a day (BID) | INTRAVENOUS | Status: DC

## 2020-07-04 MED ORDER — FENOFIBRATE 160 MG PO TABS
160.0000 mg | ORAL_TABLET | Freq: Every day | ORAL | Status: DC
  Administered 2020-07-04 – 2020-07-08 (×5): 160 mg via ORAL
  Filled 2020-07-04 (×5): qty 1

## 2020-07-04 MED ORDER — HEPARIN SODIUM (PORCINE) 1000 UNIT/ML IJ SOLN
INTRAMUSCULAR | Status: DC | PRN
  Administered 2020-07-04: 4000 [IU] via INTRAVENOUS

## 2020-07-04 MED ORDER — HEPARIN (PORCINE) IN NACL 1000-0.9 UT/500ML-% IV SOLN
INTRAVENOUS | Status: AC
  Filled 2020-07-04: qty 1000

## 2020-07-04 SURGICAL SUPPLY — 11 items
CATH INFINITI JR4 5F (CATHETERS) ×2 IMPLANT
CATH OPTITORQUE TIG 4.0 5F (CATHETERS) ×2 IMPLANT
DEVICE RAD COMP TR BAND LRG (VASCULAR PRODUCTS) ×2 IMPLANT
GLIDESHEATH SLEND SS 6F .021 (SHEATH) ×2 IMPLANT
GUIDEWIRE INQWIRE 1.5J.035X260 (WIRE) ×1 IMPLANT
INQWIRE 1.5J .035X260CM (WIRE) ×2
KIT HEART LEFT (KITS) ×2 IMPLANT
PACK CARDIAC CATHETERIZATION (CUSTOM PROCEDURE TRAY) ×2 IMPLANT
TRANSDUCER W/STOPCOCK (MISCELLANEOUS) ×2 IMPLANT
TUBING CIL FLEX 10 FLL-RA (TUBING) ×2 IMPLANT
WIRE HI TORQ VERSACORE-J 145CM (WIRE) ×2 IMPLANT

## 2020-07-04 NOTE — Plan of Care (Signed)

## 2020-07-04 NOTE — Plan of Care (Signed)
°  Problem: Education: Goal: Knowledge of General Education information will improve Description: Including pain rating scale, medication(s)/side effects and non-pharmacologic comfort measures 07/04/2020 1727 by Arlina Robes, RN Outcome: Progressing 07/04/2020 1722 by Arlina Robes, RN Outcome: Progressing   Problem: Health Behavior/Discharge Planning: Goal: Ability to manage health-related needs will improve 07/04/2020 1727 by Arlina Robes, RN Outcome: Progressing 07/04/2020 1722 by Arlina Robes, RN Outcome: Progressing   Problem: Clinical Measurements: Goal: Ability to maintain clinical measurements within normal limits will improve 07/04/2020 1727 by Arlina Robes, RN Outcome: Progressing 07/04/2020 1722 by Arlina Robes, RN Outcome: Progressing Goal: Will remain free from infection 07/04/2020 1727 by Arlina Robes, RN Outcome: Progressing 07/04/2020 1722 by Arlina Robes, RN Outcome: Progressing Goal: Diagnostic test results will improve 07/04/2020 1727 by Arlina Robes, RN Outcome: Progressing 07/04/2020 1722 by Arlina Robes, RN Outcome: Progressing Goal: Respiratory complications will improve 07/04/2020 1727 by Arlina Robes, RN Outcome: Progressing 07/04/2020 1722 by Arlina Robes, RN Outcome: Progressing Goal: Cardiovascular complication will be avoided 07/04/2020 1727 by Arlina Robes, RN Outcome: Progressing 07/04/2020 1722 by Arlina Robes, RN Outcome: Progressing   Problem: Activity: Goal: Risk for activity intolerance will decrease 07/04/2020 1727 by Arlina Robes, RN Outcome: Progressing 07/04/2020 1722 by Arlina Robes, RN Outcome: Progressing   Problem: Nutrition: Goal: Adequate nutrition will be maintained 07/04/2020 1727 by Arlina Robes, RN Outcome: Progressing 07/04/2020 1722 by Arlina Robes, RN Outcome: Progressing   Problem: Coping: Goal: Level  of anxiety will decrease 07/04/2020 1727 by Arlina Robes, RN Outcome: Progressing 07/04/2020 1722 by Arlina Robes, RN Outcome: Progressing   Problem: Elimination: Goal: Will not experience complications related to bowel motility 07/04/2020 1727 by Arlina Robes, RN Outcome: Progressing 07/04/2020 1722 by Arlina Robes, RN Outcome: Progressing Goal: Will not experience complications related to urinary retention 07/04/2020 1727 by Arlina Robes, RN Outcome: Progressing 07/04/2020 1722 by Arlina Robes, RN Outcome: Progressing   Problem: Pain Managment: Goal: General experience of comfort will improve 07/04/2020 1727 by Arlina Robes, RN Outcome: Progressing 07/04/2020 1722 by Arlina Robes, RN Outcome: Progressing   Problem: Safety: Goal: Ability to remain free from injury will improve 07/04/2020 1727 by Arlina Robes, RN Outcome: Progressing 07/04/2020 1722 by Arlina Robes, RN Outcome: Progressing   Problem: Skin Integrity: Goal: Risk for impaired skin integrity will decrease 07/04/2020 1727 by Arlina Robes, RN Outcome: Progressing 07/04/2020 1722 by Arlina Robes, RN Outcome: Progressing   Problem: Education: Goal: Understanding of CV disease, CV risk reduction, and recovery process will improve Outcome: Progressing Goal: Individualized Educational Video(s) Outcome: Progressing   Problem: Activity: Goal: Ability to return to baseline activity level will improve Outcome: Progressing   Problem: Cardiovascular: Goal: Ability to achieve and maintain adequate cardiovascular perfusion will improve Outcome: Progressing Goal: Vascular access site(s) Level 0-1 will be maintained Outcome: Progressing   Problem: Health Behavior/Discharge Planning: Goal: Ability to safely manage health-related needs after discharge will improve Outcome: Progressing

## 2020-07-04 NOTE — Plan of Care (Signed)

## 2020-07-04 NOTE — Progress Notes (Signed)
TR band removed. Small oozing noted. Manual pressure held x 62min. No bleeding or hematoma noted. Small amount of bruising noted. 4x4 gauze/tegaderm dressing applied. Post activity and precautions explained; patient verbalized understanding. Report given to [facility] RN. Patient transported via stretcher to [facility] [address].Cindee Salt

## 2020-07-04 NOTE — Consult Note (Addendum)
[address]       [place] [address]             [phone]        Cody C Faubert [facility] Medical Record #[id number] Date of Birth: 06/27/1946  Referring: Troy Sine, MD Primary Care: Burnard Bunting, MD Primary Cardiologist:Samuel Domenic Polite, MD  Chief Complaint:  Dyspnea on exertion  History of Present Illness:     Mr. Malkiewicz is a very pleasant 74 year old gentleman with past medical history significant for stage II chronic kidney disease, hypertension, type 2 diabetes mellitus, dyslipidemia, and obstructive sleep apnea.  He has history of degenerative joint disease and is status post bilateral knee replacements   He developed a COVID infection in May 2021 and during hospitalization for treatment had a CT scan for evaluation of dyspnea.  He was discovered to have bilateral pulmonary emboli and subsequently was started on Plavix and Eliquis.  That study also revealed evidence of coronary calcifications.  He has been seen in follow-up by cardiology since that time.  An echocardiogram obtained in May 2021 demonstrated normal ejection fraction and normal mitral and aortic valve function.  He recently made a follow-up visit to his cardiologist and complained of having some dyspnea on exertion without any chest pain.  This prompted evaluation with a nuclear stress test that was markedly positive for ischemic changes in multiple regions.  Left heart catheterization was recommended and was carried out earlier today.  This demonstrated severe occlusive three-vessel coronary artery disease.  He was referred to CT surgery for consideration of coronary bypass grafting. 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  He lives with his daughter and wife.  His wife is currently recovering from rotator cuff  surgery.  He has had 2 Covid immunizations but not enough time has yet elapsed for a booster. His last dose of Plavix was today.   Current Activity/ Functional Status:   Zubrod Score: At the time of surgery this patient's most appropriate activity status/level should be described as: []     0    Normal activity, no symptoms [x]     1    Restricted in physical strenuous activity but ambulatory, able to do out light work []     2    Ambulatory and capable of self care, unable to do work activities, up and about                 more than 50%  Of the time                            []     3    Only limited self care, in bed greater than 50% of waking hours []     4    Completely disabled, no self care, confined to bed or chair []     5    Moribund  Past Medical History:  Diagnosis Date  . Arthritis   . 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 Cancer (HCC)    Skin  . Cerebral artery disease   . COVID-07 Dec 2019  . Depression   . Essential hypertension   . Hernia, umbilical   . HOH (hard of hearing)   . Hyperlipidemia   .  OSA on CPAP   . Pulmonary emboli [facility])    May 2021  . Type 2 diabetes mellitus ([place])     Past Surgical History:  Procedure Laterality Date  . COLONOSCOPY W/ POLYPECTOMY    . TOTAL KNEE ARTHROPLASTY Left 04/22/2013   Procedure: TOTAL KNEE ARTHROPLASTY- left;  Surgeon: Augustin Schooling, MD;  Location: [facility];  Service: Orthopedics;  Laterality: Left;  with femoral block  . TOTAL KNEE ARTHROPLASTY Right 01/06/2014   Procedure: RIGHT TOTAL KNEE ARTHROPLASTY;  Surgeon: Augustin Schooling, MD;  Location: [facility];  Service: Orthopedics;  Laterality: Right;    Social History   Tobacco Use  Smoking Status Never Smoker  Smokeless Tobacco Never Used    Social History   Substance and Sexual Activity  Alcohol Use No     Allergies  Allergen Reactions  . Lipitor [Atorvastatin] Other (See Comments)    Muscle pain    Current Facility-Administered Medications  Medication  Dose Route Frequency Provider Last Rate Last Admin  . 0.9 %  sodium chloride infusion  250 mL Intravenous PRN Verta Ellen., NP      . 0.9% sodium chloride infusion  1 mL/kg/hr Intravenous Continuous Verta Ellen., NP 81.2 mL/hr at 07/04/20 1056 1 mL/kg/hr at 07/04/20 1056  . aspirin chewable tablet 81 mg  81 mg Oral Pre-Cath Verta Ellen., NP      . isosorbide mononitrate (IMDUR) 24 hr tablet 30 mg  30 mg Oral Daily Troy Sine, MD      . rosuvastatin (CRESTOR) tablet 20 mg  20 mg Oral Daily Shelva Majestic A, MD      . sodium chloride flush (NS) 0.9 % injection 3 mL  3 mL Intravenous Q12H Verta Ellen., NP      . sodium chloride flush (NS) 0.9 % injection 3 mL  3 mL Intravenous PRN Verta Ellen., NP        Medications Prior to Admission  Medication Sig Dispense Refill Last Dose  . carvedilol (COREG) 12.5 MG tablet Take 12.5 mg by mouth 2 (two) times daily with a meal.   07/03/2020 at Unknown time  . Empagliflozin-metFORMIN HCl 11-998 MG TABS Take 1 tablet by mouth 2 (two) times daily.   07/03/2020 at Unknown time  . fenofibrate 160 MG tablet Take 160 mg by mouth daily.   07/03/2020 at Unknown time  . HUMALOG KWIKPEN 100 UNIT/ML KwikPen Inject 10 Units into the skin with breakfast, with lunch, and with evening meal.    07/03/2020 at Unknown time  . methylphenidate (RITALIN) 5 MG tablet Take 5 mg by mouth once a week. On Sunday mornings   Past Week at Unknown time  . TOUJEO SOLOSTAR 300 UNIT/ML SOPN Inject 30 Units into the skin daily.    07/03/2020 at Unknown time  . BD PEN NEEDLE NANO U/F 32G X 4 MM MISC 1 each by Other route daily.      . benzonatate (TESSALON) 100 MG capsule Take 1 capsule (100 mg total) by mouth every 8 (eight) hours. (Patient not taking: No sig reported) 30 capsule 0 Not Taking at Unknown time  . ipratropium (ATROVENT) 0.03 % nasal spray 1-2 sprays each nostril every 8-12 hours if needed (Patient not taking: No sig reported) 30 mL 12 Not  Taking at Unknown time  . ONETOUCH VERIO test strip 1 each by Other route in the morning, at noon, and at bedtime.   3  Family History  Problem Relation Age of Onset  . CAD Brother        Premature     Review of Systems:   ROS     Cardiac Review of Systems: Y or  [    ]= no  Chest Pain [    ]  Resting SOB [   ] Exertional SOB  [ x ]  Orthopnea [  ]   Pedal Edema [   ]    Palpitations [  ] Syncope  [  ]   Presyncope [   ]  General Review of Systems: [Y] = yes [  ]=no Constitional: recent weight change [  ]; anorexia [  ]; fatigue [  ]; nausea [  ]; night sweats [  ]; fever [  ]; or chills [  ]                                                               Dental: Last Dentist visit:   Eye : blurred vision [  ]; diplopia [   ]; vision changes [  ];  Amaurosis fugax[  ]; Resp: cough [  ];  wheezing[  ];  hemoptysis[  ]; shortness of breath[ x ]; paroxysmal nocturnal dyspnea[  ]; dyspnea on exertion[  ]; or orthopnea[  ];  GI:  gallstones[  ], vomiting[  ];  dysphagia[  ]; melena[  ];  hematochezia [  ]; heartburn[  ];   Hx of  Colonoscopy[  ]; GU: kidney stones [  ]; hematuria[  ];   dysuria [  ];  nocturia[  ];  history of     obstruction [  ]; urinary frequency [  ]             Skin: rash, swelling[  ];, hair loss[  ];  peripheral edema[  ];  or itching[  ]; Musculosketetal: myalgias[  ];  joint swelling[  ];  joint erythema[  ];  joint pain[  ];  back pain[  ];  Heme/Lymph: bruising[  ];  bleeding[  ];  anemia[  ];  Neuro: TIA[  ];  headaches[  ];  stroke[ x ];  vertigo[  ];  seizures[  ];   paresthesias[  ];  difficulty walking[  ];  Psych:depression[  ]; anxiety[  ];  Endocrine: diabetes[x  ];  thyroid dysfunction[  ];              Last dental visit was 2-3 months ago.       Physical Exam: BP (!) 161/64   Pulse 60   Temp 97.7 F (36.5 C)   Resp 11   Ht 5' 10.5" (1.791 m)   Wt 80.7 kg   SpO2 98%   BMI 25.18 kg/m    General appearance: alert, cooperative and no  distress Head: Normocephalic, without obvious abnormality, atraumatic Neck: no adenopathy, no carotid bruit, no JVD, supple, symmetrical, trachea midline and thyroid not enlarged, symmetric, no tenderness/mass/nodules Lymph nodes: Cervical, supraclavicular, and axillary nodes normal. and No cervical or clavicular adenopathy Resp: clear to auscultation bilaterally Cardio: regular rate and rhythm, S1, S2 normal, no murmur, click, rub or gallop GI: soft, non-tender; bowel sounds normal; no masses,  no organomegaly Extremities: extremities normal, atraumatic,  no cyanosis or edema and The left second toe overrides the left great.  Peripheral pulses are easily palpable.  There is a TR band over the right radial artery. Neurologic: Alert and oriented X 3, normal strength and tone. Normal symmetric reflexes. Normal coordination and gait  Diagnostic Studies & Laboratory data:  The cath report is not yet complete.    Recent Radiology Findings:   No results found.    Recent Lab Findings: Lab Results  Component Value Date   WBC 7.6 07/02/2020   HGB 12.1 (L) 07/02/2020   HCT 39.1 07/02/2020   PLT 327 07/02/2020   GLUCOSE 198 (H) 07/02/2020   TRIG 211 (H) 12/02/2019   ALT 10 12/08/2019   AST 22 12/08/2019   NA 137 07/02/2020   K 3.5 07/02/2020   CL 104 07/02/2020   CREATININE 1.33 (H) 07/02/2020   BUN 18 07/02/2020   CO2 25 07/02/2020   TSH 1.228 12/02/2019   INR 2.00 (H) 01/09/2014   HGBA1C 9.4 (H) 12/03/2019      Assessment / Plan:      -Severe multivessel coronary artery disease manifest by dyspnea on exertion.  Left-ventricular function is fairly well-preserved.  He has no significant valvular disease based on echo obtained in May of this year.  We agree that coronary bypass grafting would be his best option for revascularization.  He has been on Plavix for the past several months with the last dose earlier today.  We will need to wait for Plavix washout and will tentatively  schedule coronary bypass grafting on Monday, 07/09/2020.  The general nature of the procedure and expected recovery were discussed with Mr. Heiner and his questions were answered.  He would like for [place] to proceed with the preop work-up and plans for surgery as described.  -Stage II chronic kidney disease.  Creatinine is currently  1.3 and GFR is mildly depressed at 56  -Type 2 diabetes mellitus.  Currently on empagliflozin/Metformin and Toujeo.  His last hemoglobin A1c was 9.4 in May of this year.  We will recheck.  -History of Covid illness in May of this year with associated development of bilateral basilar pulmonary emboli.  He has subsequently fully recovered.  He has received 2 COVID  immunizations.  Covid test prior to this admission was negative.  -History of paroxysmal atrial fibrillation associated with Covid illness.  He is presently in sinus rhythm.  He has been on Eliquis until recently.  -History of hypertension-controlled with carvedilol  -Remote history of a right pontine CVA in 2013.  He has no neurologic deficits.  We will obtain a carotid duplex scan.  -Dyslipidemia: Managed with fenofibrate at home.  He has previous intolerance to statins.  He is currently prescribed rosuvastatin in the hospital.    I  spent 25 minutes counseling the patient face to face.   Antony Odea, PA-C  07/04/2020 3:44 PM   Patient examined, images of coronary arteriograms and recent echocardiogram personally reviewed and discussed with patient and wife.  1 year old diabetic was hospitalized 10 days for COVID-19 pneumonitis May 2021.  At that time he was noted to have bilateral lower lobe segmental pulmonary emboli.  He recovered from the viral pneumonitis and was discharged home on Eliquis for 6 months.  He has been on room air with fairly good recovery from the viral pneumonitis but recently has noted return of shortness of breath with exertion and perhaps some chest tightness.  He  thought he might have recurrent Covid  but was evaluated and found to have a abnormal stress test showing a new inferior wall scar with peri-infarct ischemia.  Subsequent cardiac catheterization demonstrates significant diabetic multivessel disease mainly involving the LAD and RCA.  Echocardiogram shows EF 45-50% with inferior wall scar but no significant valvular disease.  With moderate LV dysfunction and symptomatic multivessel CAD has he has been recommended for multivessel CABG.  The patient's coronary disease is a's severe diabetic pattern in the LAD and RCA distributions and the targets are suboptimal but graftable and the best long-term therapy for this patient would be surgical coronary revascularization at some increased risk due to the moderate LV dysfunction, diabetes, and suboptimal targets.  His pulmonary function  appears to be adequate with a satisfactory ABG, satisfactory chest x-ray, and PFTs pending.   He has been on Plavix for several years for history of an old stroke and this is now on hold.  Surgery has been scheduled for Monday, December 20 after Plavix washout.  He will probably need repeat CT scan of the chest to assess his pulmonary status after Covid pneumonitis however with a rising creatinine following catheterization we will hold on this for now.  Dahlia Byes MD

## 2020-07-05 ENCOUNTER — Inpatient Hospital Stay (HOSPITAL_COMMUNITY): Payer: Medicare Other

## 2020-07-05 ENCOUNTER — Encounter (HOSPITAL_COMMUNITY): Payer: Self-pay | Admitting: Cardiovascular Disease

## 2020-07-05 DIAGNOSIS — I2510 Atherosclerotic heart disease of native coronary artery without angina pectoris: Secondary | ICD-10-CM

## 2020-07-05 DIAGNOSIS — Z01810 Encounter for preprocedural cardiovascular examination: Secondary | ICD-10-CM

## 2020-07-05 LAB — BLOOD GAS, ARTERIAL
Acid-base deficit: 1.4 mmol/L (ref 0.0–2.0)
Bicarbonate: 22.9 mmol/L (ref 20.0–28.0)
Drawn by: 418751
FIO2: 21
O2 Saturation: 94.4 %
Patient temperature: 37
pCO2 arterial: 38.9 mmHg (ref 32.0–48.0)
pH, Arterial: 7.388 (ref 7.350–7.450)
pO2, Arterial: 73.6 mmHg — ABNORMAL LOW (ref 83.0–108.0)

## 2020-07-05 LAB — CBC
HCT: 34.1 % — ABNORMAL LOW (ref 39.0–52.0)
Hemoglobin: 10.5 g/dL — ABNORMAL LOW (ref 13.0–17.0)
MCH: 28 pg (ref 26.0–34.0)
MCHC: 30.8 g/dL (ref 30.0–36.0)
MCV: 90.9 fL (ref 80.0–100.0)
Platelets: 291 10*3/uL (ref 150–400)
RBC: 3.75 MIL/uL — ABNORMAL LOW (ref 4.22–5.81)
RDW: 15.1 % (ref 11.5–15.5)
WBC: 5.2 10*3/uL (ref 4.0–10.5)
nRBC: 0 % (ref 0.0–0.2)

## 2020-07-05 LAB — COMPREHENSIVE METABOLIC PANEL
ALT: 10 U/L (ref 0–44)
AST: 18 U/L (ref 15–41)
Albumin: 2.9 g/dL — ABNORMAL LOW (ref 3.5–5.0)
Alkaline Phosphatase: 44 U/L (ref 38–126)
Anion gap: 8 (ref 5–15)
BUN: 19 mg/dL (ref 8–23)
CO2: 24 mmol/L (ref 22–32)
Calcium: 8.6 mg/dL — ABNORMAL LOW (ref 8.9–10.3)
Chloride: 108 mmol/L (ref 98–111)
Creatinine, Ser: 1.56 mg/dL — ABNORMAL HIGH (ref 0.61–1.24)
GFR, Estimated: 46 mL/min — ABNORMAL LOW (ref >60)
Glucose, Bld: 183 mg/dL — ABNORMAL HIGH (ref 70–99)
Potassium: 4 mmol/L (ref 3.5–5.1)
Sodium: 140 mmol/L (ref 135–145)
Total Bilirubin: 0.6 mg/dL (ref 0.3–1.2)
Total Protein: 5.3 g/dL — ABNORMAL LOW (ref 6.5–8.1)

## 2020-07-05 LAB — ECHOCARDIOGRAM COMPLETE
Area-P 1/2: 2.83 cm2
Height: 70.5 in
P 1/2 time: 740 msec
S' Lateral: 3.8 cm
Weight: 2804.25 oz

## 2020-07-05 LAB — PROTIME-INR
INR: 1.1 (ref 0.8–1.2)
Prothrombin Time: 14 seconds (ref 11.4–15.2)

## 2020-07-05 LAB — HEMOGLOBIN A1C
Hgb A1c MFr Bld: 8 % — ABNORMAL HIGH (ref 4.8–5.6)
Mean Plasma Glucose: 182.9 mg/dL

## 2020-07-05 LAB — TSH: TSH: 2.525 u[IU]/mL (ref 0.350–4.500)

## 2020-07-05 LAB — GLUCOSE, CAPILLARY
Glucose-Capillary: 276 mg/dL — ABNORMAL HIGH (ref 70–99)
Glucose-Capillary: 143 mg/dL — ABNORMAL HIGH (ref 70–99)
Glucose-Capillary: 166 mg/dL — ABNORMAL HIGH (ref 70–99)

## 2020-07-05 MED ORDER — INSULIN DETEMIR 100 UNIT/ML ~~LOC~~ SOLN
10.0000 [IU] | Freq: Two times a day (BID) | SUBCUTANEOUS | Status: DC
  Administered 2020-07-05 – 2020-07-08 (×8): 10 [IU] via SUBCUTANEOUS
  Filled 2020-07-05 (×10): qty 0.1

## 2020-07-05 MED ORDER — INSULIN ASPART 100 UNIT/ML ~~LOC~~ SOLN
0.0000 [IU] | Freq: Three times a day (TID) | SUBCUTANEOUS | Status: DC
  Administered 2020-07-05: 2 [IU] via SUBCUTANEOUS
  Administered 2020-07-05: 5 [IU] via SUBCUTANEOUS
  Administered 2020-07-06: 2 [IU] via SUBCUTANEOUS
  Administered 2020-07-06 – 2020-07-07 (×2): 3 [IU] via SUBCUTANEOUS
  Administered 2020-07-07: 5 [IU] via SUBCUTANEOUS
  Administered 2020-07-07: 2 [IU] via SUBCUTANEOUS
  Administered 2020-07-08 (×2): 3 [IU] via SUBCUTANEOUS
  Administered 2020-07-08: 15 [IU] via SUBCUTANEOUS
  Administered 2020-07-09: 3 [IU] via SUBCUTANEOUS

## 2020-07-05 NOTE — Progress Notes (Signed)
CARDIAC REHAB PHASE I   Went to completed preop education with pt. Pt and wife asleep. Left IS along with cardiac surgery booklet and in-the-tube sheet at bedside. Will continue to follow.  [id number] Rufina Falco, RN BSN 07/05/2020 2:25 PM

## 2020-07-05 NOTE — Progress Notes (Signed)
Progress Note  Patient Name: Patrick Miranda Date of Encounter: 07/05/2020  [facility] Cardiologist: Rozann Lesches, MD   Subjective   No chest pain overnight. Planned for CABG on Monday.   Inpatient Medications    Scheduled Meds:  aspirin  81 mg Oral Daily   carvedilol  12.5 mg Oral BID WC   enoxaparin (LOVENOX) injection  40 mg Subcutaneous Q24H   fenofibrate  160 mg Oral Daily   isosorbide mononitrate  30 mg Oral Daily   rosuvastatin  20 mg Oral Daily   sodium chloride flush  3 mL Intravenous Q12H   Continuous Infusions:  sodium chloride     PRN Meds: sodium chloride, acetaminophen, diazepam, ondansetron (ZOFRAN) IV, sodium chloride flush   Vital Signs    Vitals:   07/04/20 1954 07/04/20 2359 07/05/20 0512 07/05/20 0800  BP: 129/68 (!) 92/47 (!) 109/46   Pulse: 87 60 (!) 57   Resp: 18 18 18 16   Temp: 98.9 F (37.2 C) 98.4 F (36.9 C) 98.3 F (36.8 C)   TempSrc: Oral Oral Oral   SpO2: 94% 95% 96%   Weight:   79.5 kg   Height:        Intake/Output Summary (Last 24 hours) at 07/05/2020 0959 Last data filed at 07/05/2020 0500 Gross per 24 hour  Intake 800 ml  Output 400 ml  Net 400 ml   Last 3 Weights 07/05/2020 07/04/2020 07/04/2020  Weight (lbs) 175 lb 4.3 oz 173 lb 4.5 oz 178 lb  Weight (kg) 79.5 kg 78.6 kg 80.74 kg      Telemetry    SR with PVCs, brief NSVT - Personally Reviewed  ECG    No new tracing.  Physical Exam  Pleasant older male GEN: No acute distress.   Neck: No JVD Cardiac: RRR, no murmurs, rubs, or gallops.  Respiratory: Clear to auscultation bilaterally. GI: Soft, nontender, non-distended  MS: No edema; No deformity. Right radial cath site stable. Neuro:  Nonfocal  Psych: Normal affect   Labs    High Sensitivity Troponin:  No results for input(s): TROPONINIHS in the last 720 hours.    Chemistry Recent Labs  Lab 07/02/20 1322 07/04/20 1817 07/05/20 0227  NA 137  --  140  K 3.5  --  4.0  CL 104   --  108  CO2 25  --  24  GLUCOSE 198*  --  183*  BUN 18  --  19  CREATININE 1.33* 1.25* 1.56*  CALCIUM 8.9  --  8.6*  PROT  --   --  5.3*  ALBUMIN  --   --  2.9*  AST  --   --  18  ALT  --   --  10  ALKPHOS  --   --  44  BILITOT  --   --  0.6  GFRNONAA 56* >60 46*  ANIONGAP 8  --  8     Hematology Recent Labs  Lab 07/02/20 1322 07/04/20 1817 07/05/20 0227  WBC 7.6 6.4 5.2  RBC 4.22 4.11* 3.75*  HGB 12.1* 11.8* 10.5*  HCT 39.1 37.1* 34.1*  MCV 92.7 90.3 90.9  MCH 28.7 28.7 28.0  MCHC 30.9 31.8 30.8  RDW 15.2 15.0 15.1  PLT 327 300 291    BNPNo results for input(s): BNP, PROBNP in the last 168 hours.   DDimer No results for input(s): DDIMER in the last 168 hours.   Radiology    DG Chest 2 View  Result Date:  07/05/2020 CLINICAL DATA:  Chest pain EXAM: CHEST - 2 VIEW COMPARISON:  12/06/2019 FINDINGS: The heart size and mediastinal contours are within normal limits. Both lungs are clear. The visualized skeletal structures are unremarkable. IMPRESSION: No active cardiopulmonary disease. Electronically Signed   By: Franchot Gallo M.D.   On: 07/05/2020 08:23   CARDIAC CATHETERIZATION  Result Date: 07/04/2020  Ost LAD to Prox LAD lesion is 40% stenosed.  2nd Diag lesion is 90% stenosed.  1st Diag-1 lesion is 95% stenosed.  1st Diag-2 lesion is 90% stenosed with 90% stenosed side branch in Lat 1st Diag.  Mid LAD-1 lesion is 90% stenosed.  Mid LAD-2 lesion is 70% stenosed.  Lat 2nd Mrg lesion is 95% stenosed.  1st Mrg lesion is 20% stenosed.  Prox RCA lesion is 95% stenosed.  Dist LAD lesion is 50% stenosed.  Severe coronary obstructive disease with significant calcification involving the LAD.  The LAD is diffusely diseased extending from the proximal to mid segment with 40% diffuse proximal stenosis, calcified 90% stenosis on a bend in the vessel in the region of the first and second diagonal vessels with 95% stenoses in the ostium of the first diagonal and 90% in the  second diagonal, 70% mid stenosis and 50% mid distal stenoses. The circumflex vessel has 20% smooth narrowing in the first marginal branch with 95% focal stenosis in a branch of the second marginal vessel. The RCA has a focal 95% mid stenosis with reduced antegrade distal flow secondary to competitive left-to-right collateralization. Normal LV function with EF estimated at 55 mm; LVEDP 12 mm Hg. RECOMMENDATION: Surgical consultation for CABG revascularization.  Patient will need to hold Plavix and will need Plavix washout.    Cardiac Studies   Cath: 07/04/20   Ost LAD to Prox LAD lesion is 40% stenosed.  2nd Diag lesion is 90% stenosed.  1st Diag-1 lesion is 95% stenosed.  1st Diag-2 lesion is 90% stenosed with 90% stenosed side branch in Lat 1st Diag.  Mid LAD-1 lesion is 90% stenosed.  Mid LAD-2 lesion is 70% stenosed.  Lat 2nd Mrg lesion is 95% stenosed.  1st Mrg lesion is 20% stenosed.  Prox RCA lesion is 95% stenosed.  Dist LAD lesion is 50% stenosed.   Severe coronary obstructive disease with significant calcification involving the LAD.  The LAD is diffusely diseased extending from the proximal to mid segment with 40% diffuse proximal stenosis, calcified 90% stenosis on a bend in the vessel in the region of the first and second diagonal vessels with 95% stenoses in the ostium of the first diagonal and 90% in the second diagonal, 70% mid stenosis and 50% mid distal stenoses.  The circumflex vessel has 20% smooth narrowing in the first marginal branch with 95% focal stenosis in a branch of the second marginal vessel.  The RCA has a focal 95% mid stenosis with reduced antegrade distal flow secondary to competitive left-to-right collateralization.  Normal LV function with EF estimated at 55 mm; LVEDP 12 mm Hg.  RECOMMENDATION: Surgical consultation for CABG revascularization.  Patient will need to hold Plavix and will need Plavix washout. Diagnostic Dominance:  Right   Echo: pending  Patient Profile     74 y.o. male with a history of CAD, CVA, OSA, DVT/PE, HTN, DM 2, PAF who underwent outpatient Lexiscan myoview which was abnormal and set up for outpatient cardiac cath.   Assessment & Plan    1. CAD: underwent cardiac cath yesterday with severe multivessel disease. TCTS consulted with  plans for CABG on 12/20 after plavix washout. No significant chest pain overnight. Undergoing preop work up per TCTS.  -- on ASA, statin, fenofibrate, Imdur and BB  2. Hx of PE: has been treated with Eliquis for total of 6 months for DVT/PE secondary to Covid infection in May.   3. Hx of PAF: occurred in the setting of COVID PNA. He is currently in SR. No need for IV heparin at this time.  -- monitor on telemetry  4. HLD: on Crestor and fenofibrate  5. HTN: blood pressures are soft this morning. May need to reduce coreg if they remain soft.   6. DM: treated with empagliflozin/metformin (held during admission) and toujeo.  -- Hgb A1c pending -- SSI while inpatient  For questions or updates, please contact [place] Please consult [email or web address] for contact info under        Signed, Reino Bellis, NP  07/05/2020, 9:59 AM

## 2020-07-05 NOTE — Progress Notes (Signed)
  Echocardiogram 2D Echocardiogram has been performed.  Jennette Dubin 07/05/2020, 10:24 AM

## 2020-07-06 ENCOUNTER — Ambulatory Visit: Payer: Medicare Other | Admitting: Podiatry

## 2020-07-06 ENCOUNTER — Inpatient Hospital Stay (HOSPITAL_COMMUNITY): Payer: Medicare Other

## 2020-07-06 ENCOUNTER — Other Ambulatory Visit (HOSPITAL_COMMUNITY): Payer: Self-pay | Admitting: Respiratory Therapy

## 2020-07-06 DIAGNOSIS — R0602 Shortness of breath: Secondary | ICD-10-CM

## 2020-07-06 DIAGNOSIS — R9439 Abnormal result of other cardiovascular function study: Secondary | ICD-10-CM

## 2020-07-06 LAB — LIPID PANEL
Cholesterol: 122 mg/dL (ref 0–200)
HDL: 27 mg/dL — ABNORMAL LOW (ref >40)
LDL Cholesterol: 68 mg/dL (ref 0–99)
Total CHOL/HDL Ratio: 4.5 RATIO
Triglycerides: 133 mg/dL (ref <150)
VLDL: 27 mg/dL (ref 0–40)

## 2020-07-06 LAB — CBC
HCT: 31.7 % — ABNORMAL LOW (ref 39.0–52.0)
Hemoglobin: 10.6 g/dL — ABNORMAL LOW (ref 13.0–17.0)
MCH: 29.5 pg (ref 26.0–34.0)
MCHC: 33.4 g/dL (ref 30.0–36.0)
MCV: 88.3 fL (ref 80.0–100.0)
Platelets: 282 10*3/uL (ref 150–400)
RBC: 3.59 MIL/uL — ABNORMAL LOW (ref 4.22–5.81)
RDW: 15.1 % (ref 11.5–15.5)
WBC: 5.4 10*3/uL (ref 4.0–10.5)
nRBC: 0 % (ref 0.0–0.2)

## 2020-07-06 LAB — BASIC METABOLIC PANEL
Anion gap: 11 (ref 5–15)
BUN: 22 mg/dL (ref 8–23)
CO2: 21 mmol/L — ABNORMAL LOW (ref 22–32)
Calcium: 8.5 mg/dL — ABNORMAL LOW (ref 8.9–10.3)
Chloride: 108 mmol/L (ref 98–111)
Creatinine, Ser: 1.46 mg/dL — ABNORMAL HIGH (ref 0.61–1.24)
GFR, Estimated: 50 mL/min — ABNORMAL LOW (ref >60)
Glucose, Bld: 154 mg/dL — ABNORMAL HIGH (ref 70–99)
Potassium: 3.8 mmol/L (ref 3.5–5.1)
Sodium: 140 mmol/L (ref 135–145)

## 2020-07-06 LAB — PULMONARY FUNCTION TEST
FEF 25-75 Pre: 1.63 L/sec
FEF2575-%Pred-Pre: 71 %
FEV1-%Pred-Pre: 79 %
FEV1-Pre: 2.47 L
FEV1FVC-%Pred-Pre: 99 %
FEV6-%Pred-Pre: 79 %
FEV6-Pre: 3.24 L
FEV6FVC-%Pred-Pre: 100 %
FVC-%Pred-Pre: 79 %
FVC-Pre: 3.42 L
Pre FEV1/FVC ratio: 72 %
Pre FEV6/FVC Ratio: 95 %

## 2020-07-06 LAB — GLUCOSE, CAPILLARY
Glucose-Capillary: 107 mg/dL — ABNORMAL HIGH (ref 70–99)
Glucose-Capillary: 192 mg/dL — ABNORMAL HIGH (ref 70–99)
Glucose-Capillary: 143 mg/dL — ABNORMAL HIGH (ref 70–99)
Glucose-Capillary: 246 mg/dL — ABNORMAL HIGH (ref 70–99)

## 2020-07-06 MED ORDER — NOREPINEPHRINE 4 MG/250ML-% IV SOLN
0.0000 ug/min | INTRAVENOUS | Status: DC
  Filled 2020-07-06: qty 250

## 2020-07-06 MED ORDER — POTASSIUM CHLORIDE 2 MEQ/ML IV SOLN
80.0000 meq | INTRAVENOUS | Status: DC
  Filled 2020-07-06: qty 40

## 2020-07-06 MED ORDER — PHENYLEPHRINE HCL-NACL 20-0.9 MG/250ML-% IV SOLN
30.0000 ug/min | INTRAVENOUS | Status: DC
  Filled 2020-07-06: qty 250

## 2020-07-06 MED ORDER — SODIUM CHLORIDE 0.9 % IV SOLN
INTRAVENOUS | Status: DC
  Filled 2020-07-06: qty 30

## 2020-07-06 MED ORDER — EPINEPHRINE HCL 5 MG/250ML IV SOLN IN NS
0.0000 ug/min | INTRAVENOUS | Status: DC
  Filled 2020-07-06: qty 250

## 2020-07-06 MED ORDER — SODIUM CHLORIDE 0.9 % IV SOLN
1.5000 g | INTRAVENOUS | Status: AC
  Administered 2020-07-09: 1.5 g via INTRAVENOUS
  Filled 2020-07-06: qty 1.5

## 2020-07-06 MED ORDER — TRANEXAMIC ACID (OHS) PUMP PRIME SOLUTION
2.0000 mg/kg | INTRAVENOUS | Status: DC
  Filled 2020-07-06 (×2): qty 1.61

## 2020-07-06 MED ORDER — TRANEXAMIC ACID (OHS) BOLUS VIA INFUSION
15.0000 mg/kg | INTRAVENOUS | Status: AC
  Administered 2020-07-09: 1209 mg via INTRAVENOUS
  Filled 2020-07-06: qty 1209

## 2020-07-06 MED ORDER — NITROGLYCERIN IN D5W 200-5 MCG/ML-% IV SOLN: 2.0000 ug/min | INTRAVENOUS | Status: DC

## 2020-07-06 MED ORDER — MAGNESIUM SULFATE 50 % IJ SOLN
40.0000 meq | INTRAMUSCULAR | Status: DC
  Filled 2020-07-06: qty 9.85

## 2020-07-06 MED ORDER — PHENYLEPHRINE HCL-NACL 20-0.9 MG/250ML-% IV SOLN
30.0000 ug/min | INTRAVENOUS | Status: AC
  Administered 2020-07-09: 20 ug/min via INTRAVENOUS
  Filled 2020-07-06: qty 250

## 2020-07-06 MED ORDER — INSULIN REGULAR(HUMAN) IN NACL 100-0.9 UT/100ML-% IV SOLN
INTRAVENOUS | Status: AC
  Administered 2020-07-09: 1.8 [IU]/h via INTRAVENOUS
  Filled 2020-07-06: qty 100

## 2020-07-06 MED ORDER — TRANEXAMIC ACID (OHS) PUMP PRIME SOLUTION
2.0000 mg/kg | INTRAVENOUS | Status: DC
  Filled 2020-07-06: qty 1.61

## 2020-07-06 MED ORDER — NITROGLYCERIN IN D5W 200-5 MCG/ML-% IV SOLN
2.0000 ug/min | INTRAVENOUS | Status: DC
  Filled 2020-07-06: qty 250

## 2020-07-06 MED ORDER — TRANEXAMIC ACID 1000 MG/10ML IV SOLN
1.5000 mg/kg/h | INTRAVENOUS | Status: AC
  Administered 2020-07-09: 1.5 mg/kg/h via INTRAVENOUS
  Filled 2020-07-06: qty 25

## 2020-07-06 MED ORDER — PLASMA-LYTE 148 IV SOLN
INTRAVENOUS | Status: DC
  Filled 2020-07-06: qty 2.5

## 2020-07-06 MED ORDER — IOHEXOL 350 MG/ML SOLN
75.0000 mL | Freq: Once | INTRAVENOUS | Status: AC | PRN
  Administered 2020-07-06: 75 mL via INTRAVENOUS

## 2020-07-06 MED ORDER — DEXMEDETOMIDINE HCL IN NACL 400 MCG/100ML IV SOLN
0.1000 ug/kg/h | INTRAVENOUS | Status: AC
  Administered 2020-07-09: .4 ug/kg/h via INTRAVENOUS
  Filled 2020-07-06: qty 100

## 2020-07-06 MED ORDER — VANCOMYCIN HCL 1250 MG/250ML IV SOLN
1250.0000 mg | INTRAVENOUS | Status: DC
  Filled 2020-07-06: qty 250

## 2020-07-06 MED ORDER — NOREPINEPHRINE 4 MG/250ML-% IV SOLN
0.0000 ug/min | INTRAVENOUS | Status: AC
  Administered 2020-07-09: 2 ug/min via INTRAVENOUS
  Filled 2020-07-06: qty 250

## 2020-07-06 MED ORDER — MILRINONE LACTATE IN DEXTROSE 20-5 MG/100ML-% IV SOLN
0.3000 ug/kg/min | INTRAVENOUS | Status: AC
  Administered 2020-07-09: .25 ug/kg/min via INTRAVENOUS
  Filled 2020-07-06: qty 100

## 2020-07-06 MED ORDER — DEXMEDETOMIDINE HCL IN NACL 400 MCG/100ML IV SOLN
0.1000 ug/kg/h | INTRAVENOUS | Status: DC
  Filled 2020-07-06: qty 100

## 2020-07-06 MED ORDER — SODIUM CHLORIDE 0.9 % IV SOLN
750.0000 mg | INTRAVENOUS | Status: AC
  Administered 2020-07-09: 750 mg via INTRAVENOUS
  Filled 2020-07-06: qty 750

## 2020-07-06 MED ORDER — MILRINONE LACTATE IN DEXTROSE 20-5 MG/100ML-% IV SOLN
0.3000 ug/kg/min | INTRAVENOUS | Status: DC
  Filled 2020-07-06: qty 100

## 2020-07-06 MED ORDER — TRANEXAMIC ACID (OHS) BOLUS VIA INFUSION
15.0000 mg/kg | INTRAVENOUS | Status: DC
  Filled 2020-07-06: qty 1209

## 2020-07-06 MED ORDER — INSULIN REGULAR(HUMAN) IN NACL 100-0.9 UT/100ML-% IV SOLN: INTRAVENOUS | Status: DC

## 2020-07-06 NOTE — Progress Notes (Signed)
CARDIAC REHAB PHASE I   PRE:  Rate/Rhythm: 63 SR  BP:  Supine:   Sitting: 120/70  Standing:    SaO2: 96%RA  MODE:  Ambulation: 400 ft   POST:  Rate/Rhythm: 76 SR  BP:  Supine:   Sitting: 126/67  Standing:    SaO2: 96%RA 1120-1150 Pt walked 400 ft on RA independently without CP. Tolerated well. Reviewed staying in the tube and sternal precautions with pt. Had pt stand without use of arms. Discussed importance of IS and walking after surgery. Pt's IS not working correctly so encouraged staff to get him a new one. Wrote down how to view pre op video. Encouraged him to watch and to look over OHS booklet. Wife will be available to assist in care after discharge. Answered questions. Will follow up after surgery. Graylon Good RN BSN 07/06/2020 11:52 AM     Graylon Good, RN BSN  07/06/2020 11:48 AM   63 SR

## 2020-07-06 NOTE — Progress Notes (Signed)
PT Cancellation Note  Patient Details Name: Enetai MRN: [id number] DOB: [birth year]   Cancelled Treatment:    Reason Eval/Treat Not Completed: PT screened, no needs identified, will sign off. Pt seen by cardiac rehab and all pre-CABG education given - See Cardiac rehab note. Pt noted to be amb in halls independently.   [place] 07/06/2020, 2:22 PM [place] Pager [phone] Office [phone]

## 2020-07-06 NOTE — Progress Notes (Signed)
Progress Note  Patient Name: Patrick Miranda Date of Encounter: 07/06/2020  [facility] Cardiologist: Rozann Lesches, MD   Subjective   No chest pain overnight. Planned for CABG on Monday.   Inpatient Medications    Scheduled Meds: . aspirin  81 mg Oral Daily  . carvedilol  12.5 mg Oral BID WC  . enoxaparin (LOVENOX) injection  40 mg Subcutaneous Q24H  . [START ON 07/09/2020] epinephrine  0-10 mcg/min Intravenous To OR  . fenofibrate  160 mg Oral Daily  . [START ON 07/09/2020] heparin-papaverine-plasmalyte irrigation   Irrigation To OR  . insulin aspart  0-15 Units Subcutaneous TID WC  . insulin detemir  10 Units Subcutaneous BID  . [START ON 07/09/2020] insulin   Intravenous To OR  . isosorbide mononitrate  30 mg Oral Daily  . [START ON 07/09/2020] magnesium sulfate  40 mEq Other To OR  . [START ON 07/09/2020] phenylephrine  30-200 mcg/min Intravenous To OR  . [START ON 07/09/2020] potassium chloride  80 mEq Other To OR  . rosuvastatin  20 mg Oral Daily  . sodium chloride flush  3 mL Intravenous Q12H  . [START ON 07/09/2020] tranexamic acid  15 mg/kg Intravenous To OR  . [START ON 07/09/2020] tranexamic acid  2 mg/kg Intracatheter To OR   Continuous Infusions: . sodium chloride    . [START ON 07/09/2020] cefUROXime (ZINACEF)  IV    . [START ON 07/09/2020] cefUROXime (ZINACEF)  IV    . [START ON 07/09/2020] dexmedetomidine    . [START ON 07/09/2020] heparin 30,000 units/NS 1000 mL solution for CELLSAVER    . [START ON 07/09/2020] milrinone    . [START ON 07/09/2020] nitroGLYCERIN    . [START ON 07/09/2020] norepinephrine    . [START ON 07/09/2020] tranexamic acid (CYKLOKAPRON) infusion ([facility])    . [START ON 07/09/2020] vancomycin     PRN Meds: sodium chloride, acetaminophen, diazepam, ondansetron (ZOFRAN) IV, sodium chloride flush   Vital Signs    Vitals:   07/06/20 0021 07/06/20 0027 07/06/20 0528 07/06/20 0839  BP: (!) 102/55  139/72   Pulse: (!) 53  63  68  Resp: 18  18   Temp: 98.6 F (37 C)  98.9 F (37.2 C)   TempSrc: Oral  Oral   SpO2: 97%  96%   Weight:  80.6 kg    Height:        Intake/Output Summary (Last 24 hours) at 07/06/2020 0953 Last data filed at 07/06/2020 0532 Gross per 24 hour  Intake --  Output 300 ml  Net -300 ml   Last 3 Weights 07/06/2020 07/05/2020 07/04/2020  Weight (lbs) 177 lb 11.1 oz 175 lb 4.3 oz 173 lb 4.5 oz  Weight (kg) 80.6 kg 79.5 kg 78.6 kg      Telemetry    SR with few PVCs- Personally Reviewed  ECG    No new tracing.  Physical Exam  Pleasant older male GEN: No acute distress.   Neck: No JVD Cardiac: RRR, no murmurs, rubs, or gallops.  Respiratory: Clear to auscultation bilaterally. GI: Soft, nontender, non-distended  MS: No edema; No deformity. Right radial cath site stable. Neuro:  Nonfocal  Psych: Normal affect   Labs    High Sensitivity Troponin:  No results for input(s): TROPONINIHS in the last 720 hours.    Chemistry Recent Labs  Lab 07/02/20 1322 07/04/20 1817 07/05/20 0227 07/06/20 0303  NA 137  --  140 140  K 3.5  --  4.0 3.8  CL 104  --  108 108  CO2 25  --  24 21*  GLUCOSE 198*  --  183* 154*  BUN 18  --  19 22  CREATININE 1.33* 1.25* 1.56* 1.46*  CALCIUM 8.9  --  8.6* 8.5*  PROT  --   --  5.3*  --   ALBUMIN  --   --  2.9*  --   AST  --   --  18  --   ALT  --   --  10  --   ALKPHOS  --   --  44  --   BILITOT  --   --  0.6  --   GFRNONAA 56* >60 46* 50*  ANIONGAP 8  --  8 11     Hematology Recent Labs  Lab 07/04/20 1817 07/05/20 0227 07/06/20 0303  WBC 6.4 5.2 5.4  RBC 4.11* 3.75* 3.59*  HGB 11.8* 10.5* 10.6*  HCT 37.1* 34.1* 31.7*  MCV 90.3 90.9 88.3  MCH 28.7 28.0 29.5  MCHC 31.8 30.8 33.4  RDW 15.0 15.1 15.1  PLT 300 291 282    BNPNo results for input(s): BNP, PROBNP in the last 168 hours.   DDimer No results for input(s): DDIMER in the last 168 hours.   Radiology    DG Chest 2 View  Result Date: 07/05/2020 CLINICAL DATA:   Chest pain EXAM: CHEST - 2 VIEW COMPARISON:  12/06/2019 FINDINGS: The heart size and mediastinal contours are within normal limits. Both lungs are clear. The visualized skeletal structures are unremarkable. IMPRESSION: No active cardiopulmonary disease. Electronically Signed   By: Franchot Gallo M.D.   On: 07/05/2020 08:23   CARDIAC CATHETERIZATION  Result Date: 07/04/2020  Ost LAD to Prox LAD lesion is 40% stenosed.  2nd Diag lesion is 90% stenosed.  1st Diag-1 lesion is 95% stenosed.  1st Diag-2 lesion is 90% stenosed with 90% stenosed side branch in Lat 1st Diag.  Mid LAD-1 lesion is 90% stenosed.  Mid LAD-2 lesion is 70% stenosed.  Lat 2nd Mrg lesion is 95% stenosed.  1st Mrg lesion is 20% stenosed.  Prox RCA lesion is 95% stenosed.  Dist LAD lesion is 50% stenosed.  Severe coronary obstructive disease with significant calcification involving the LAD.  The LAD is diffusely diseased extending from the proximal to mid segment with 40% diffuse proximal stenosis, calcified 90% stenosis on a bend in the vessel in the region of the first and second diagonal vessels with 95% stenoses in the ostium of the first diagonal and 90% in the second diagonal, 70% mid stenosis and 50% mid distal stenoses. The circumflex vessel has 20% smooth narrowing in the first marginal branch with 95% focal stenosis in a branch of the second marginal vessel. The RCA has a focal 95% mid stenosis with reduced antegrade distal flow secondary to competitive left-to-right collateralization. Normal LV function with EF estimated at 55 mm; LVEDP 12 mm Hg. RECOMMENDATION: Surgical consultation for CABG revascularization.  Patient will need to hold Plavix and will need Plavix washout.   ECHOCARDIOGRAM COMPLETE  Result Date: 07/05/2020    ECHOCARDIOGRAM REPORT   Patient Name:   Patrick Miranda Hoelzer Date of Exam: 07/05/2020 Medical Rec #:  [id number]          Height:       70.5 in Accession #:    [id number]         Weight:       175.3  lb Date of Birth:  09/08/1945  BSA:          1.984 m Patient Age:    73 years           BP:           109/46 mmHg Patient Gender: M                  HR:           62 bpm. Exam Location:  Inpatient Procedure: 2D Echo Indications:    CAD  History:        Patient has prior history of Echocardiogram examinations, most                 recent 12/03/2019. Acute MI, Arrythmias:Atrial Fibrillation; Risk                 Factors:Hypertension, Diabetes and Dyslipidemia.  Sonographer:    Mikki Santee RDCS (AE) Referring Phys: [place]  1. Akinesis of the basal inferior, inferolateral and inferoseptal walls; inferobasal aneurysm; overall mild LV dysfunction.  2. Left ventricular ejection fraction, by estimation, is 45 to 50%. The left ventricle has mildly decreased function. The left ventricle demonstrates regional wall motion abnormalities (see scoring diagram/findings for description). The left ventricular  internal cavity size was mildly dilated. Left ventricular diastolic parameters are consistent with Grade I diastolic dysfunction (impaired relaxation).  3. Right ventricular systolic function is normal. The right ventricular size is normal.  4. The mitral valve is normal in structure. Trivial mitral valve regurgitation. No evidence of mitral stenosis.  5. The aortic valve is tricuspid. Aortic valve regurgitation is mild. Mild aortic valve sclerosis is present, with no evidence of aortic valve stenosis.  6. The inferior vena cava is normal in size with greater than 50% respiratory variability, suggesting right atrial pressure of 3 mmHg. FINDINGS  Left Ventricle: Left ventricular ejection fraction, by estimation, is 45 to 50%. The left ventricle has mildly decreased function. The left ventricle demonstrates regional wall motion abnormalities. The left ventricular internal cavity size was mildly dilated. There is no left ventricular hypertrophy. Left ventricular diastolic parameters are  consistent with Grade I diastolic dysfunction (impaired relaxation). Right Ventricle: The right ventricular size is normal.Right ventricular systolic function is normal. Left Atrium: Left atrial size was normal in size. Right Atrium: Right atrial size was normal in size. Pericardium: There is no evidence of pericardial effusion. Mitral Valve: The mitral valve is normal in structure. Trivial mitral valve regurgitation. No evidence of mitral valve stenosis. Tricuspid Valve: The tricuspid valve is normal in structure. Tricuspid valve regurgitation is trivial. No evidence of tricuspid stenosis. Aortic Valve: The aortic valve is tricuspid. Aortic valve regurgitation is mild. Aortic regurgitation PHT measures 740 msec. Mild aortic valve sclerosis is present, with no evidence of aortic valve stenosis. Pulmonic Valve: The pulmonic valve was not well visualized. Pulmonic valve regurgitation is not visualized. No evidence of pulmonic stenosis. Aorta: The aortic root is normal in size and structure. Venous: The inferior vena cava is normal in size with greater than 50% respiratory variability, suggesting right atrial pressure of 3 mmHg. IAS/Shunts: No atrial level shunt detected by color flow Doppler. Additional Comments: Akinesis of the basal inferior, inferolateral and inferoseptal walls; inferobasal aneurysm; overall mild LV dysfunction.  LEFT VENTRICLE PLAX 2D LVIDd:         5.70 cm  Diastology LVIDs:         3.80 cm  LV e' medial:    5.98 cm/s LV PW:  1.10 cm  LV E/e' medial:  14.1 LV IVS:        1.10 cm  LV e' lateral:   6.20 cm/s LVOT diam:     2.20 cm  LV E/e' lateral: 13.6 LV SV:         118 LV SV Index:   60 LVOT Area:     3.80 cm  RIGHT VENTRICLE RV S prime:     13.60 cm/s TAPSE (M-mode): 2.5 cm LEFT ATRIUM           Index       RIGHT ATRIUM           Index LA diam:      2.80 cm 1.41 cm/m  RA Area:     14.30 cm LA Vol (A2C): 51.0 ml 25.71 ml/m RA Volume:   28.20 ml  14.21 ml/m LA Vol (A4C): 61.8 ml 31.15  ml/m  AORTIC VALVE LVOT Vmax:   129.00 cm/s LVOT Vmean:  88.500 cm/s LVOT VTI:    0.311 m AI PHT:      740 msec  AORTA Ao Root diam: 3.30 cm Ao Asc diam:  3.60 cm MITRAL VALVE               TRICUSPID VALVE MV Area (PHT): 2.83 cm    TR Peak grad:   12.5 mmHg MV Decel Time: 268 msec    TR Vmax:        177.00 cm/s MV E velocity: 84.40 cm/s MV A velocity: 82.00 cm/s  SHUNTS MV E/A ratio:  1.03        Systemic VTI:  0.31 m                            Systemic Diam: 2.20 cm Kirk Ruths MD Electronically signed by Kirk Ruths MD Signature Date/Time: 07/05/2020/11:12:58 AM    Final    VAS US DOPPLER PRE CABG  Result Date: 07/06/2020 PREOPERATIVE VASCULAR EVALUATION  Indications:  Pre-CABG. Risk Factors: Hypertension, hyperlipidemia, Diabetes, coronary artery disease. Performing Technologist: Rogelia Rohrer RVT, RDMS RVT, RDMS Supporting Technologist: Darlin Coco RDMS  Examination Guidelines: A complete evaluation includes B-mode imaging, spectral Doppler, color Doppler, and power Doppler as needed of all accessible portions of each vessel. Bilateral testing is considered an integral part of a complete examination. Limited examinations for reoccurring indications may be performed as noted.  Right Carotid Findings: +----------+--------+--------+--------+-------------------+--------+           PSV cm/sEDV cm/sStenosisDescribe           Comments +----------+--------+--------+--------+-------------------+--------+ CCA Prox  78      20                                          +----------+--------+--------+--------+-------------------+--------+ CCA Distal96      17                                          +----------+--------+--------+--------+-------------------+--------+ ICA Prox  58      19      1-39%   calcific and smooth         +----------+--------+--------+--------+-------------------+--------+ ICA Distal77      20                                           +----------+--------+--------+--------+-------------------+--------+  ECA       158                                                 +----------+--------+--------+--------+-------------------+--------+ Portions of this table do not appear on this page. +----------+--------+-------+----------------+------------+           PSV cm/sEDV cmsDescribe        Arm Pressure +----------+--------+-------+----------------+------------+ Subclavian161            Multiphasic, WNL             +----------+--------+-------+----------------+------------+ +---------+--------+--+--------+--+---------+ VertebralPSV cm/s58EDV cm/s18Antegrade +---------+--------+--+--------+--+---------+ Left Carotid Findings: +----------+--------+--------+--------+------------+--------+           PSV cm/sEDV cm/sStenosisDescribe    Comments +----------+--------+--------+--------+------------+--------+ CCA Prox  95      17                                   +----------+--------+--------+--------+------------+--------+ CCA Distal87      15                                   +----------+--------+--------+--------+------------+--------+ ICA Prox  65      22      1-39%   heterogenous         +----------+--------+--------+--------+------------+--------+ ICA Distal80      26                                   +----------+--------+--------+--------+------------+--------+ ECA       132                                          +----------+--------+--------+--------+------------+--------+ +----------+--------+--------+----------------+------------+ SubclavianPSV cm/sEDV cm/sDescribe        Arm Pressure +----------+--------+--------+----------------+------------+           173             Multiphasic, WNL             +----------+--------+--------+----------------+------------+ +---------+--------+--+--------+--+---------+ VertebralPSV cm/s41EDV cm/s11Antegrade  +---------+--------+--+--------+--+---------+  ABI Findings: +---------+------------------+-----+---------+--------+ Right    Rt Pressure (mmHg)IndexWaveform Comment  +---------+------------------+-----+---------+--------+ Brachial 112                    triphasic         +---------+------------------+-----+---------+--------+ PTA      143               1.28 triphasic         +---------+------------------+-----+---------+--------+ DP       131               1.17 biphasic          +---------+------------------+-----+---------+--------+ Great Toe164               1.46 Normal            +---------+------------------+-----+---------+--------+ +---------+-----------------+-----+---------+----------------------------------+ Left     Lt Pressure      IndexWaveform Comment                                     (  mmHg)                                                            +---------+-----------------+-----+---------+----------------------------------+ Brachial                       triphasicUnable to obtain pressure due to                                           IV.                                +---------+-----------------+-----+---------+----------------------------------+ PTA      151              1.35 triphasic                                   +---------+-----------------+-----+---------+----------------------------------+ DP       128              1.14 triphasic                                   +---------+-----------------+-----+---------+----------------------------------+ Dewitt Hoes              1.46 Normal                                      +---------+-----------------+-----+---------+----------------------------------+  Right Doppler Findings: +--------+--------+-----+---------+--------+ Site    PressureIndexDoppler  Comments +--------+--------+-----+---------+--------+ [id number]          triphasic          +--------+--------+-----+---------+--------+ Radial               triphasic         +--------+--------+-----+---------+--------+ Ulnar                triphasic         +--------+--------+-----+---------+--------+  Left Doppler Findings: +--------+--------+-----+---------+------------------------------------+ Site    PressureIndexDoppler  Comments                             +--------+--------+-----+---------+------------------------------------+ Brachial             triphasicUnable to obtain pressure due to IV. +--------+--------+-----+---------+------------------------------------+ Radial               triphasic                                     +--------+--------+-----+---------+------------------------------------+ Ulnar                triphasic                                     +--------+--------+-----+---------+------------------------------------+  Summary: Right Carotid: Velocities in the right ICA are consistent  with a 1-39% stenosis. Left Carotid: Velocities in the left ICA are consistent with a 1-39% stenosis. Vertebrals:  Bilateral vertebral arteries demonstrate antegrade flow. Subclavians: Normal flow hemodynamics were seen in bilateral subclavian              arteries. Right ABI: Resting right ankle-brachial index is within normal range. No evidence of significant right lower extremity arterial disease. The right toe-brachial index is normal. Left ABI: Resting left ankle-brachial index indicates noncompressible left lower extremity arteries. The left toe-brachial index is normal. Right Upper Extremity: Doppler waveforms decrease 50% with right radial compression. Doppler waveforms decrease >50% with right ulnar compression. Left Upper Extremity: Doppler waveforms remain within normal limits with left radial compression. Doppler waveform obliterate with left ulnar compression.     Preliminary     Cardiac Studies   Cath: 07/04/20   Ost LAD to Prox LAD lesion  is 40% stenosed.  2nd Diag lesion is 90% stenosed.  1st Diag-1 lesion is 95% stenosed.  1st Diag-2 lesion is 90% stenosed with 90% stenosed side branch in Lat 1st Diag.  Mid LAD-1 lesion is 90% stenosed.  Mid LAD-2 lesion is 70% stenosed.  Lat 2nd Mrg lesion is 95% stenosed.  1st Mrg lesion is 20% stenosed.  Prox RCA lesion is 95% stenosed.  Dist LAD lesion is 50% stenosed.   Severe coronary obstructive disease with significant calcification involving the LAD.  The LAD is diffusely diseased extending from the proximal to mid segment with 40% diffuse proximal stenosis, calcified 90% stenosis on a bend in the vessel in the region of the first and second diagonal vessels with 95% stenoses in the ostium of the first diagonal and 90% in the second diagonal, 70% mid stenosis and 50% mid distal stenoses.  The circumflex vessel has 20% smooth narrowing in the first marginal branch with 95% focal stenosis in a branch of the second marginal vessel.  The RCA has a focal 95% mid stenosis with reduced antegrade distal flow secondary to competitive left-to-right collateralization.  Normal LV function with EF estimated at 55 mm; LVEDP 12 mm Hg.  RECOMMENDATION: Surgical consultation for CABG revascularization.  Patient will need to hold Plavix and will need Plavix washout. Diagnostic Dominance: Right   Echo: pending  Patient Profile     74 y.o. male with a history of CAD, CVA, OSA, DVT/PE, HTN, DM 2, PAF who underwent outpatient Lexiscan myoview which was abnormal and set up for outpatient cardiac cath.   Assessment & Plan    1. CAD: underwent cardiac cath with severe multivessel disease. TCTS consulted with plans for CABG on 12/20 by Dr Prescott Gum after plavix washout. No significant chest pain overnight. Pre CABG dopplers OK. -- on ASA, statin, fenofibrate, Imdur and BB  2. Hx of PE: has been treated with Eliquis for total of 6 months for DVT/PE secondary to Covid infection in May.    3. Hx of PAF: occurred in the setting of COVID PNA. He is currently in SR. No need for IV heparin at this time.  -- monitor on telemetry  4. HLD: on Crestor and fenofibrate  5. HTN: BP stable  6. DM: treated with empagliflozin/metformin (held during admission) and toujeo.  -- Hgb A1c pending -- SSI while inpatient  7. CKD stage 3a. Creatinine improved today  For questions or updates, please contact [place] Please consult [email or web address] for contact info under        Signed, Nicle Connole Martin[place], MD  07/06/2020, 9:53 AM

## 2020-07-06 NOTE — Plan of Care (Signed)
  Problem: Education: Goal: Knowledge of General Education information will improve Description: Including pain rating scale, medication(s)/side effects and non-pharmacologic comfort measures Outcome: Progressing   Problem: Clinical Measurements: Goal: Diagnostic test results will improve Outcome: Progressing   Problem: Activity: Goal: Risk for activity intolerance will decrease Outcome: Progressing   Problem: Pain Managment: Goal: General experience of comfort will improve Outcome: Progressing   Problem: Cardiovascular: Goal: Vascular access site(s) Level 0-1 will be maintained Outcome: Progressing

## 2020-07-06 NOTE — Consult Note (Signed)
   [facility] Inpatient Consult   07/06/2020  Patrick Miranda 05/24/1946 [id number]  [facility] [[facility]] Patient: [employer] previously contacted patient  Patient was assessed for [facility] for community services. Patient was previously outreached with a Echo Management.  Patient discussed in unit base progression meeting for ongoing hospital transition of care needs and however, surgery is planned for Monday.    Primary Care Provider:  Dr. Burnard Bunting and this office is listed to provide the transition of care follow up needs for call and appointment.  Plan:  Continue to follow for needs after surgery.  Of note, [facility] Care Management services does not replace or interfere with any services that are arranged by inpatient [facility] care management team.   For additional questions or referrals please contact:  Natividad Brood, RN BSN [facility] Liaison  [phone] business mobile phone Toll free office [phone]  Fax number: [phone] [email or web address]

## 2020-07-06 NOTE — Progress Notes (Signed)
Patient alert and oriented, independent with all ADLs. Resps even and unlabored, clear in all lobes. Patient denies chest pain at rest and with exertion. Abdomen soft, nontender, nondistended and BS active in all quads; last BM reported as last night 12/16. Patient is continent of bowel and bladder and uses BR ad lib. He does not exhibit any edema to BLE and walks frequently in his room. Patient denies pain at this time. He understands the plan of care and need to proceed with CABG. No questions at this time. Call light in reach, he is able and encouraged to make needs known.

## 2020-07-06 NOTE — Progress Notes (Signed)
Progress Note  Patient Name: Patrick Miranda Date of Encounter: 07/07/2020  [facility] Cardiologist: Rozann Lesches, MD   Subjective   Feels well. No chest pain or SOB. Hoping to walk the hallways with his wife today.  Inpatient Medications    Scheduled Meds: . aspirin  81 mg Oral Daily  . carvedilol  12.5 mg Oral BID WC  . enoxaparin (LOVENOX) injection  40 mg Subcutaneous Q24H  . [START ON 07/09/2020] epinephrine  0-10 mcg/min Intravenous To OR  . fenofibrate  160 mg Oral Daily  . [START ON 07/09/2020] heparin-papaverine-plasmalyte irrigation   Irrigation To OR  . insulin aspart  0-15 Units Subcutaneous TID WC  . insulin detemir  10 Units Subcutaneous BID  . [START ON 07/09/2020] insulin   Intravenous To OR  . isosorbide mononitrate  30 mg Oral Daily  . [START ON 07/09/2020] magnesium sulfate  40 mEq Other To OR  . [START ON 07/09/2020] phenylephrine  30-200 mcg/min Intravenous To OR  . [START ON 07/09/2020] potassium chloride  80 mEq Other To OR  . rosuvastatin  20 mg Oral Daily  . sodium chloride flush  3 mL Intravenous Q12H  . [START ON 07/09/2020] tranexamic acid  15 mg/kg Intravenous To OR  . [START ON 07/09/2020] tranexamic acid  2 mg/kg Intracatheter To OR   Continuous Infusions: . sodium chloride    . [START ON 07/09/2020] cefUROXime (ZINACEF)  IV    . [START ON 07/09/2020] cefUROXime (ZINACEF)  IV    . [START ON 07/09/2020] dexmedetomidine    . [START ON 07/09/2020] heparin 30,000 units/NS 1000 mL solution for CELLSAVER    . [START ON 07/09/2020] milrinone    . [START ON 07/09/2020] nitroGLYCERIN    . [START ON 07/09/2020] norepinephrine    . [START ON 07/09/2020] tranexamic acid (CYKLOKAPRON) infusion ([facility])    . [START ON 07/09/2020] vancomycin     PRN Meds: sodium chloride, acetaminophen, diazepam, ondansetron (ZOFRAN) IV, sodium chloride flush   Vital Signs    Vitals:   07/06/20 0839 07/06/20 1126 07/07/20 0437 07/07/20 0442  BP:  120/70  131/76   Pulse: 68 63 62   Resp:  18 18   Temp:  98.2 F (36.8 C) 97.8 F (36.6 C)   TempSrc:  Oral Oral   SpO2:  96% 96%   Weight:    79.2 kg  Height:        Intake/Output Summary (Last 24 hours) at 07/07/2020 0955 Last data filed at 07/06/2020 1352 Gross per 24 hour  Intake 483 ml  Output 450 ml  Net 33 ml   Last 3 Weights 07/07/2020 07/06/2020 07/05/2020  Weight (lbs) 174 lb 9.6 oz 177 lb 11.1 oz 175 lb 4.3 oz  Weight (kg) 79.198 kg 80.6 kg 79.5 kg      Telemetry    NSR - Personally Reviewed  ECG    No new tracing - Personally Reviewed  Physical Exam   GEN: No acute distress.   Neck: No JVD Cardiac: RRR, no murmurs, rubs, or gallops.  Respiratory: Clear to auscultation bilaterally. GI: Soft, nontender, non-distended  MS: No edema; No deformity. Neuro:  Nonfocal  Psych: Normal affect   Labs    High Sensitivity Troponin:  No results for input(s): TROPONINIHS in the last 720 hours.    Chemistry Recent Labs  Lab 07/05/20 0227 07/06/20 0303 07/07/20 0337  NA 140 140 140  K 4.0 3.8 4.3  CL 108 108 107  CO2 24 21*  27  GLUCOSE 183* 154* 153*  BUN 19 22 21   CREATININE 1.56* 1.46* 1.57*  CALCIUM 8.6* 8.5* 8.7*  PROT 5.3*  --   --   ALBUMIN 2.9*  --   --   AST 18  --   --   ALT 10  --   --   ALKPHOS 44  --   --   BILITOT 0.6  --   --   GFRNONAA 46* 50* 46*  ANIONGAP 8 11 6      Hematology Recent Labs  Lab 07/05/20 0227 07/06/20 0303 07/07/20 0337  WBC 5.2 5.4 5.7  RBC 3.75* 3.59* 3.89*  HGB 10.5* 10.6* 11.0*  HCT 34.1* 31.7* 35.3*  MCV 90.9 88.3 90.7  MCH 28.0 29.5 28.3  MCHC 30.8 33.4 31.2  RDW 15.1 15.1 14.9  PLT 291 282 265    BNPNo results for input(s): BNP, PROBNP in the last 168 hours.   DDimer No results for input(s): DDIMER in the last 168 hours.   Radiology    CT CHEST W CONTRAST  Result Date: 07/06/2020 CLINICAL DATA:  Pleural effusion. History of COVID infection. Preoperative respiratory evaluation for CABG. EXAM: CT  CHEST WITH CONTRAST TECHNIQUE: Multidetector CT imaging of the chest was performed during intravenous contrast administration. CONTRAST:  5mL OMNIPAQUE IOHEXOL 350 MG/ML SOLN COMPARISON:  CTA chest 12/02/2019 FINDINGS: Cardiovascular: The heart size is normal. There is marked thinning of the inferior wall the left ventricle (see coronal image 53 of series 6 and sagittal image 103 of series 7). This is likely related to infarct although pseudoaneurysm cannot be excluded. No substantial pericardial effusion. Coronary artery calcification is evident. Atherosclerotic calcification is noted in the wall of the thoracic aorta. Mediastinum/Nodes: No mediastinal lymphadenopathy. There is no hilar lymphadenopathy. The esophagus has normal imaging features. There is no axillary lymphadenopathy. Lungs/Pleura: Suggestion of underlying subtle changes of centrilobular emphysema. The peripheral ground-glass and confluent airspace disease seen on the previous exam has largely resolved in the interval although there is some persistent architectural distortion and interstitial scarring in the regions of previously noted lung disease. No suspicious pulmonary nodule or mass. No dense focal airspace consolidation. No evidence for pulmonary edema or pleural effusion. Upper Abdomen: Unremarkable. Musculoskeletal: No worrisome lytic or sclerotic osseous abnormality. Degenerative changes noted right shoulder. IMPRESSION: 1. Marked thinning of the inferior wall of the left ventricle towards the base. This is compatible with infarct although pseudoaneurysm cannot be excluded. 2. Interval resolution of the peripheral ground-glass and confluent airspace disease seen on the previous exam with some persistent architectural distortion and interstitial opacity in the regions of previously noted lung disease. This may reflect incomplete resolution of changes related to the prior disease or chronic post infectious/inflammatory scarring. 3. No  suspicious pulmonary nodule or mass. 4. Aortic Atherosclerosis (ICD10-I70.0). Electronically Signed   By: Misty Stanley M.D.   On: 07/06/2020 11:44   VAS [place] LOWER EXTREMITY SAPHENOUS VEIN MAPPING  Result Date: 07/06/2020 LOWER EXTREMITY VEIN MAPPING Other Indications: Pre-Op CABG Risk Factors:      Hypertension, hyperlipidemia, Diabetes.  Performing Technologist: Vonzell Schlatter RVT  Examination Guidelines: A complete evaluation includes B-mode imaging, spectral Doppler, color Doppler, and power Doppler as needed of all accessible portions of each vessel. Bilateral testing is considered an integral part of a complete examination. Limited examinations for reoccurring indications may be performed as noted. +---------------+-----------+----------------------+---------------+-----------+   RT Diameter  RT Findings         GSV  LT Diameter  LT Findings      (cm)                                            (cm)                  +---------------+-----------+----------------------+---------------+-----------+      0.72                     Saphenofemoral         0.71                                                   Junction                                  +---------------+-----------+----------------------+---------------+-----------+      0.58                     Proximal thigh         0.57                  +---------------+-----------+----------------------+---------------+-----------+      0.43                       Mid thigh            0.51                  +---------------+-----------+----------------------+---------------+-----------+      0.43                      Distal thigh          0.42                  +---------------+-----------+----------------------+---------------+-----------+      0.40                          Knee              0.31                  +---------------+-----------+----------------------+---------------+-----------+      0.39        branching       Prox calf            0.36       branching  +---------------+-----------+----------------------+---------------+-----------+      0.23                        Mid calf            0.24       branching  +---------------+-----------+----------------------+---------------+-----------+      0.20                      Distal calf           0.22       branching  +---------------+-----------+----------------------+---------------+-----------+      0.17  Ankle              0.33                  +---------------+-----------+----------------------+---------------+-----------+ +----------------+-----------+---------------+----------------+--------------+ RT diameter (cm)RT Findings      SSV      LT Diameter (cm) LT Findings   +----------------+-----------+---------------+----------------+--------------+       0.21                 Popliteal fossa                not visualized +----------------+-----------+---------------+----------------+--------------+       0.20                  Proximal calf                 not visualized +----------------+-----------+---------------+----------------+--------------+       0.22                    Mid calf                    not visualized +----------------+-----------+---------------+----------------+--------------+       0.18                   Distal calf                  not visualized +----------------+-----------+---------------+----------------+--------------+ Diagnosing physician: Monica Martinez MD Electronically signed by Monica Martinez MD on 07/06/2020 at 5:00:35 PM.    Final    ECHOCARDIOGRAM COMPLETE  Result Date: 07/05/2020    ECHOCARDIOGRAM REPORT   Patient Name:   KUNIO CUMMISKEY Ritchie Date of Exam: 07/05/2020 Medical Rec #:  [id number]          Height:       70.5 in Accession #:    [id number]         Weight:       175.3 lb Date of Birth:  September 15, 1945          BSA:          1.984 m  Patient Age:    27 years           BP:           109/46 mmHg Patient Gender: M                  HR:           62 bpm. Exam Location:  Inpatient Procedure: 2D Echo Indications:    CAD  History:        Patient has prior history of Echocardiogram examinations, most                 recent 12/03/2019. Acute MI, Arrythmias:Atrial Fibrillation; Risk                 Factors:Hypertension, Diabetes and Dyslipidemia.  Sonographer:    Mikki Santee RDCS (AE) Referring Phys: [place]  1. Akinesis of the basal inferior, inferolateral and inferoseptal walls; inferobasal aneurysm; overall mild LV dysfunction.  2. Left ventricular ejection fraction, by estimation, is 45 to 50%. The left ventricle has mildly decreased function. The left ventricle demonstrates regional wall motion abnormalities (see scoring diagram/findings for description). The left ventricular  internal cavity size was mildly dilated. Left ventricular diastolic parameters are consistent with Grade I diastolic dysfunction (impaired relaxation).  3. Right ventricular systolic function is normal. The right ventricular size is normal.  4. The mitral valve is normal in structure. Trivial mitral valve regurgitation. No evidence of mitral stenosis.  5. The aortic valve is tricuspid. Aortic valve regurgitation is mild. Mild aortic valve sclerosis is present, with no evidence of aortic valve stenosis.  6. The inferior vena cava is normal in size with greater than 50% respiratory variability, suggesting right atrial pressure of 3 mmHg. FINDINGS  Left Ventricle: Left ventricular ejection fraction, by estimation, is 45 to 50%. The left ventricle has mildly decreased function. The left ventricle demonstrates regional wall motion abnormalities. The left ventricular internal cavity size was mildly dilated. There is no left ventricular hypertrophy. Left ventricular diastolic parameters are consistent with Grade I diastolic dysfunction (impaired  relaxation). Right Ventricle: The right ventricular size is normal.Right ventricular systolic function is normal. Left Atrium: Left atrial size was normal in size. Right Atrium: Right atrial size was normal in size. Pericardium: There is no evidence of pericardial effusion. Mitral Valve: The mitral valve is normal in structure. Trivial mitral valve regurgitation. No evidence of mitral valve stenosis. Tricuspid Valve: The tricuspid valve is normal in structure. Tricuspid valve regurgitation is trivial. No evidence of tricuspid stenosis. Aortic Valve: The aortic valve is tricuspid. Aortic valve regurgitation is mild. Aortic regurgitation PHT measures 740 msec. Mild aortic valve sclerosis is present, with no evidence of aortic valve stenosis. Pulmonic Valve: The pulmonic valve was not well visualized. Pulmonic valve regurgitation is not visualized. No evidence of pulmonic stenosis. Aorta: The aortic root is normal in size and structure. Venous: The inferior vena cava is normal in size with greater than 50% respiratory variability, suggesting right atrial pressure of 3 mmHg. IAS/Shunts: No atrial level shunt detected by color flow Doppler. Additional Comments: Akinesis of the basal inferior, inferolateral and inferoseptal walls; inferobasal aneurysm; overall mild LV dysfunction.  LEFT VENTRICLE PLAX 2D LVIDd:         5.70 cm  Diastology LVIDs:         3.80 cm  LV e' medial:    5.98 cm/s LV PW:         1.10 cm  LV E/e' medial:  14.1 LV IVS:        1.10 cm  LV e' lateral:   6.20 cm/s LVOT diam:     2.20 cm  LV E/e' lateral: 13.6 LV SV:         118 LV SV Index:   60 LVOT Area:     3.80 cm  RIGHT VENTRICLE RV S prime:     13.60 cm/s TAPSE (M-mode): 2.5 cm LEFT ATRIUM           Index       RIGHT ATRIUM           Index LA diam:      2.80 cm 1.41 cm/m  RA Area:     14.30 cm LA Vol (A2C): 51.0 ml 25.71 ml/m RA Volume:   28.20 ml  14.21 ml/m LA Vol (A4C): 61.8 ml 31.15 ml/m  AORTIC VALVE LVOT Vmax:   129.00 cm/s LVOT  Vmean:  88.500 cm/s LVOT VTI:    0.311 m AI PHT:      740 msec  AORTA Ao Root diam: 3.30 cm Ao Asc diam:  3.60 cm MITRAL VALVE               TRICUSPID VALVE MV Area (PHT): 2.83 cm    TR Peak grad:   12.5 mmHg MV Decel Time: 268 msec    TR  Vmax:        177.00 cm/s MV E velocity: 84.40 cm/s MV A velocity: 82.00 cm/s  SHUNTS MV E/A ratio:  1.03        Systemic VTI:  0.31 m                            Systemic Diam: 2.20 cm Kirk Ruths MD Electronically signed by Kirk Ruths MD Signature Date/Time: 07/05/2020/11:12:58 AM    Final    VAS US DOPPLER PRE CABG  Result Date: 07/06/2020 PREOPERATIVE VASCULAR EVALUATION  Indications:  Pre-CABG. Risk Factors: Hypertension, hyperlipidemia, Diabetes, coronary artery disease. Performing Technologist: Rogelia Rohrer RVT, RDMS RVT, RDMS Supporting Technologist: Darlin Coco RDMS  Examination Guidelines: A complete evaluation includes B-mode imaging, spectral Doppler, color Doppler, and power Doppler as needed of all accessible portions of each vessel. Bilateral testing is considered an integral part of a complete examination. Limited examinations for reoccurring indications may be performed as noted.  Right Carotid Findings: +----------+--------+--------+--------+-------------------+--------+           PSV cm/sEDV cm/sStenosisDescribe           Comments +----------+--------+--------+--------+-------------------+--------+ CCA Prox  78      20                                          +----------+--------+--------+--------+-------------------+--------+ CCA Distal96      17                                          +----------+--------+--------+--------+-------------------+--------+ ICA Prox  58      19      1-39%   calcific and smooth         +----------+--------+--------+--------+-------------------+--------+ ICA Distal77      20                                          +----------+--------+--------+--------+-------------------+--------+ ECA        158                                                 +----------+--------+--------+--------+-------------------+--------+ Portions of this table do not appear on this page. +----------+--------+-------+----------------+------------+           PSV cm/sEDV cmsDescribe        Arm Pressure +----------+--------+-------+----------------+------------+ Subclavian161            Multiphasic, WNL             +----------+--------+-------+----------------+------------+ +---------+--------+--+--------+--+---------+ VertebralPSV cm/s58EDV cm/s18Antegrade +---------+--------+--+--------+--+---------+ Left Carotid Findings: +----------+--------+--------+--------+------------+--------+           PSV cm/sEDV cm/sStenosisDescribe    Comments +----------+--------+--------+--------+------------+--------+ CCA Prox  95      17                                   +----------+--------+--------+--------+------------+--------+ CCA Distal87      15                                   +----------+--------+--------+--------+------------+--------+  ICA Prox  65      22      1-39%   heterogenous         +----------+--------+--------+--------+------------+--------+ ICA Distal80      26                                   +----------+--------+--------+--------+------------+--------+ ECA       132                                          +----------+--------+--------+--------+------------+--------+ +----------+--------+--------+----------------+------------+ SubclavianPSV cm/sEDV cm/sDescribe        Arm Pressure +----------+--------+--------+----------------+------------+           173             Multiphasic, WNL             +----------+--------+--------+----------------+------------+ +---------+--------+--+--------+--+---------+ VertebralPSV cm/s41EDV cm/s11Antegrade +---------+--------+--+--------+--+---------+  ABI Findings:  +---------+------------------+-----+---------+--------+ Right    Rt Pressure (mmHg)IndexWaveform Comment  +---------+------------------+-----+---------+--------+ Brachial 112                    triphasic         +---------+------------------+-----+---------+--------+ PTA      143               1.28 triphasic         +---------+------------------+-----+---------+--------+ DP       131               1.17 biphasic          +---------+------------------+-----+---------+--------+ Doristine Devoid Toe164               1.46 Normal            +---------+------------------+-----+---------+--------+ +---------+-----------------+-----+---------+----------------------------------+ Left     Lt Pressure      IndexWaveform Comment                                     (mmHg)                                                            +---------+-----------------+-----+---------+----------------------------------+ Brachial                       triphasicUnable to obtain pressure due to                                           IV.                                +---------+-----------------+-----+---------+----------------------------------+ PTA      151              1.35 triphasic                                   +---------+-----------------+-----+---------+----------------------------------+ DP  128              1.14 triphasic                                   +---------+-----------------+-----+---------+----------------------------------+ Doristine Devoid Toe163              1.46 Normal                                      +---------+-----------------+-----+---------+----------------------------------+  Right Doppler Findings: +--------+--------+-----+---------+--------+ Site    PressureIndexDoppler  Comments +--------+--------+-----+---------+--------+ [id number]          triphasic         +--------+--------+-----+---------+--------+ Radial                triphasic         +--------+--------+-----+---------+--------+ Ulnar                triphasic         +--------+--------+-----+---------+--------+  Left Doppler Findings: +--------+--------+-----+---------+------------------------------------+ Site    PressureIndexDoppler  Comments                             +--------+--------+-----+---------+------------------------------------+ Brachial             triphasicUnable to obtain pressure due to IV. +--------+--------+-----+---------+------------------------------------+ Radial               triphasic                                     +--------+--------+-----+---------+------------------------------------+ Ulnar                triphasic                                     +--------+--------+-----+---------+------------------------------------+  Summary: Right Carotid: Velocities in the right ICA are consistent with a 1-39% stenosis. Left Carotid: Velocities in the left ICA are consistent with a 1-39% stenosis. Vertebrals:  Bilateral vertebral arteries demonstrate antegrade flow. Subclavians: Normal flow hemodynamics were seen in bilateral subclavian              arteries. Right ABI: Resting right ankle-brachial index is within normal range. No evidence of significant right lower extremity arterial disease. The right toe-brachial index is normal. Left ABI: Resting left ankle-brachial index indicates noncompressible left lower extremity arteries. The left toe-brachial index is normal. Right Upper Extremity: Doppler waveforms decrease 50% with right radial compression. Doppler waveforms decrease >50% with right ulnar compression. Left Upper Extremity: Doppler waveforms remain within normal limits with left radial compression. Doppler waveform obliterate with left ulnar compression.  Electronically signed by Monica Martinez MD on 07/06/2020 at 4:55:42 PM.    Final     Cardiac Studies   Cath: 07/04/20   Ost LAD to Prox LAD  lesion is 40% stenosed.  2nd Diag lesion is 90% stenosed.  1st Diag-1 lesion is 95% stenosed.  1st Diag-2 lesion is 90% stenosed with 90% stenosed side branch in Lat 1st Diag.  Mid LAD-1 lesion is 90% stenosed.  Mid LAD-2 lesion is 70% stenosed.  Lat 2nd Mrg lesion is 95% stenosed.  1st Mrg lesion is 20% stenosed.  Prox  RCA lesion is 95% stenosed.  Dist LAD lesion is 50% stenosed.  Severe coronary obstructive disease with significant calcification involving the LAD. The LAD is diffusely diseased extending from the proximal to mid segment with 40% diffuse proximal stenosis, calcified 90% stenosis on a bend in the vessel in the region of the first and second diagonal vessels with 95% stenoses in the ostium of the first diagonal and 90% in the second diagonal, 70% mid stenosis and 50% mid distal stenoses.  The circumflex vessel has 20% smooth narrowing in the first marginal branch with 95% focal stenosis in a branch of the second marginal vessel.  The RCA has a focal 95% mid stenosis with reduced antegrade distal flow secondary to competitive left-to-right collateralization.  Normal LV function with EF estimated at 55 mm; LVEDP 12 mm Hg.  RECOMMENDATION: Surgical consultation for CABG revascularization. Patient will need to hold Plavix and will need Plavix washout. Diagnostic Dominance: Right     Patient Profile     74 y.o. male with history of CAD, CVA, OSA,DVT/PE, HTN, DM 2, PAF who underwent outpatient Lexiscan myoview which was abnormal and set up for outpatient cardiac cath found to have multivessel CAD now awaiting CABG.  Assessment & Plan    #Mutivessel CAD:  Underwent cardiac cath due to abnormal myoview found to have severe multivessel disease. TCTS consulted with plans for CABG on 12/20 by Dr Prescott Gum after plavix washout. No significant chest pain. Pre CABG dopplers OK. -Continue ASA, statin, fenofibrate, Imdur and BB -Plan for CABG with CV  surgery  #Hx of PE:  Has been treated with Eliquis for total of 6 months for DVT/PE secondary to Covid infection in May.  -Now off anitcoagulation  #Hx of PAF: occurred in the setting of COVID PNA. He is currently in SR. No need for IV heparin at this time.  -Monitor on telemetry  #HLD:  -Continue Crestor and fenofibrate  #HTN:  Well controlled. -Continue coreg and imdur as above  # DM: treated with empagliflozin/metformin (held during admission) and toujeo.  - HgB A1C 8 - SSI while inpatient - Will need aggressive diabetes control as out-patient with goal A1C<7  #CKD stage 3a: -Renally dose medications -Trend  For questions or updates, please contact [place] Please consult [email or web address] for contact info under        Signed, Freada Bergeron, MD  07/07/2020, 9:55 AM

## 2020-07-07 LAB — BASIC METABOLIC PANEL
Anion gap: 6 (ref 5–15)
BUN: 21 mg/dL (ref 8–23)
CO2: 27 mmol/L (ref 22–32)
Calcium: 8.7 mg/dL — ABNORMAL LOW (ref 8.9–10.3)
Chloride: 107 mmol/L (ref 98–111)
Creatinine, Ser: 1.57 mg/dL — ABNORMAL HIGH (ref 0.61–1.24)
GFR, Estimated: 46 mL/min — ABNORMAL LOW (ref >60)
Glucose, Bld: 153 mg/dL — ABNORMAL HIGH (ref 70–99)
Potassium: 4.3 mmol/L (ref 3.5–5.1)
Sodium: 140 mmol/L (ref 135–145)

## 2020-07-07 LAB — GLUCOSE, CAPILLARY
Glucose-Capillary: 130 mg/dL — ABNORMAL HIGH (ref 70–99)
Glucose-Capillary: 191 mg/dL — ABNORMAL HIGH (ref 70–99)
Glucose-Capillary: 256 mg/dL — ABNORMAL HIGH (ref 70–99)
Glucose-Capillary: 155 mg/dL — ABNORMAL HIGH (ref 70–99)

## 2020-07-07 LAB — CBC
HCT: 35.3 % — ABNORMAL LOW (ref 39.0–52.0)
Hemoglobin: 11 g/dL — ABNORMAL LOW (ref 13.0–17.0)
MCH: 28.3 pg (ref 26.0–34.0)
MCHC: 31.2 g/dL (ref 30.0–36.0)
MCV: 90.7 fL (ref 80.0–100.0)
Platelets: 265 10*3/uL (ref 150–400)
RBC: 3.89 MIL/uL — ABNORMAL LOW (ref 4.22–5.81)
RDW: 14.9 % (ref 11.5–15.5)
WBC: 5.7 10*3/uL (ref 4.0–10.5)
nRBC: 0 % (ref 0.0–0.2)

## 2020-07-07 NOTE — Plan of Care (Signed)
  Problem: Activity: Goal: Risk for activity intolerance will decrease Outcome: Progressing   Problem: Nutrition: Goal: Adequate nutrition will be maintained Outcome: Progressing   Problem: Elimination: Goal: Will not experience complications related to bowel motility Outcome: Progressing Goal: Will not experience complications related to urinary retention Outcome: Progressing   Problem: Pain Managment: Goal: General experience of comfort will improve Outcome: Progressing   Problem: Safety: Goal: Ability to remain free from injury will improve Outcome: Progressing   Problem: Skin Integrity: Goal: Risk for impaired skin integrity will decrease Outcome: Progressing   Problem: Education: Goal: Understanding of CV disease, CV risk reduction, and recovery process will improve Outcome: Progressing   Problem: Activity: Goal: Ability to return to baseline activity level will improve Outcome: Progressing   Problem: Cardiovascular: Goal: Vascular access site(s) Level 0-1 will be maintained Outcome: Completed/Met

## 2020-07-07 NOTE — Progress Notes (Signed)
Progress Note  Patient Name: Patrick Miranda Date of Encounter: 07/08/2020  [facility] Cardiologist: Rozann Lesches, MD   Subjective   Feels well. States he is ready for his surgery tomorrow. Ambulated the hallway without symptoms  Renal function improved and back to baseline with Cr 1.36. Plan for CABG tomorrow.  Inpatient Medications    Scheduled Meds: . aspirin  81 mg Oral Daily  . carvedilol  12.5 mg Oral BID WC  . enoxaparin (LOVENOX) injection  40 mg Subcutaneous Q24H  . [START ON 07/09/2020] epinephrine  0-10 mcg/min Intravenous To OR  . fenofibrate  160 mg Oral Daily  . [START ON 07/09/2020] heparin-papaverine-plasmalyte irrigation   Irrigation To OR  . insulin aspart  0-15 Units Subcutaneous TID WC  . insulin detemir  10 Units Subcutaneous BID  . [START ON 07/09/2020] insulin   Intravenous To OR  . isosorbide mononitrate  30 mg Oral Daily  . [START ON 07/09/2020] magnesium sulfate  40 mEq Other To OR  . [START ON 07/09/2020] phenylephrine  30-200 mcg/min Intravenous To OR  . [START ON 07/09/2020] potassium chloride  80 mEq Other To OR  . rosuvastatin  20 mg Oral Daily  . sodium chloride flush  3 mL Intravenous Q12H  . [START ON 07/09/2020] tranexamic acid  15 mg/kg Intravenous To OR  . [START ON 07/09/2020] tranexamic acid  2 mg/kg Intracatheter To OR   Continuous Infusions: . sodium chloride    . [START ON 07/09/2020] cefUROXime (ZINACEF)  IV    . [START ON 07/09/2020] cefUROXime (ZINACEF)  IV    . [START ON 07/09/2020] dexmedetomidine    . [START ON 07/09/2020] heparin 30,000 units/NS 1000 mL solution for CELLSAVER    . [START ON 07/09/2020] milrinone    . [START ON 07/09/2020] nitroGLYCERIN    . [START ON 07/09/2020] norepinephrine    . [START ON 07/09/2020] tranexamic acid (CYKLOKAPRON) infusion ([facility])    . [START ON 07/09/2020] vancomycin     PRN Meds: sodium chloride, acetaminophen, diazepam, ondansetron (ZOFRAN) IV, sodium chloride flush    Vital Signs    Vitals:   07/07/20 1148 07/07/20 2037 07/08/20 0500 07/08/20 0816  BP: 118/68 114/85 133/69   Pulse: (!) 58 (!) 59 60 67  Resp: 18 18 18    Temp: 98.3 F (36.8 C) 97.8 F (36.6 C) 98 F (36.7 C)   TempSrc: Oral Oral Oral   SpO2: 95% 98% 96%   Weight:   78.7 kg   Height:        Intake/Output Summary (Last 24 hours) at 07/08/2020 0931 Last data filed at 07/08/2020 0818 Gross per 24 hour  Intake 483 ml  Output --  Net 483 ml   Last 3 Weights 07/08/2020 07/07/2020 07/06/2020  Weight (lbs) 173 lb 8 oz 174 lb 9.6 oz 177 lb 11.1 oz  Weight (kg) 78.7 kg 79.198 kg 80.6 kg      Telemetry    NSR with PVCs - Personally Reviewed  ECG    No new tracing - Personally Reviewed  Physical Exam   GEN: No acute distress.   Neck: No JVD Cardiac: RRR, no murmurs, rubs, or gallops.  Respiratory: Clear to auscultation bilaterally. GI: Soft, nontender, non-distended  MS: No edema; No deformity. Neuro:  Nonfocal  Psych: Normal affect   Labs    High Sensitivity Troponin:  No results for input(s): TROPONINIHS in the last 720 hours.    Chemistry Recent Labs  Lab 07/05/20 0227 07/06/20 0303 07/07/20  1610 07/08/20 0139  NA 140 140 140 139  K 4.0 3.8 4.3 4.0  CL 108 108 107 108  CO2 24 21* 27 24  GLUCOSE 183* 154* 153* 144*  BUN 19 22 21 23   CREATININE 1.56* 1.46* 1.57* 1.36*  CALCIUM 8.6* 8.5* 8.7* 8.8*  PROT 5.3*  --   --   --   ALBUMIN 2.9*  --   --   --   AST 18  --   --   --   ALT 10  --   --   --   ALKPHOS 44  --   --   --   BILITOT 0.6  --   --   --   GFRNONAA 46* 50* 46* 54*  ANIONGAP 8 11 6 7      Hematology Recent Labs  Lab 07/06/20 0303 07/07/20 0337 07/08/20 0139  WBC 5.4 5.7 5.2  RBC 3.59* 3.89* 3.74*  HGB 10.6* 11.0* 10.9*  HCT 31.7* 35.3* 32.9*  MCV 88.3 90.7 88.0  MCH 29.5 28.3 29.1  MCHC 33.4 31.2 33.1  RDW 15.1 14.9 14.7  PLT 282 265 270    BNPNo results for input(s): BNP, PROBNP in the last 168 hours.   DDimer No  results for input(s): DDIMER in the last 168 hours.   Radiology    CT CHEST W CONTRAST  Result Date: 07/06/2020 CLINICAL DATA:  Pleural effusion. History of COVID infection. Preoperative respiratory evaluation for CABG. EXAM: CT CHEST WITH CONTRAST TECHNIQUE: Multidetector CT imaging of the chest was performed during intravenous contrast administration. CONTRAST:  10mL OMNIPAQUE IOHEXOL 350 MG/ML SOLN COMPARISON:  CTA chest 12/02/2019 FINDINGS: Cardiovascular: The heart size is normal. There is marked thinning of the inferior wall the left ventricle (see coronal image 53 of series 6 and sagittal image 103 of series 7). This is likely related to infarct although pseudoaneurysm cannot be excluded. No substantial pericardial effusion. Coronary artery calcification is evident. Atherosclerotic calcification is noted in the wall of the thoracic aorta. Mediastinum/Nodes: No mediastinal lymphadenopathy. There is no hilar lymphadenopathy. The esophagus has normal imaging features. There is no axillary lymphadenopathy. Lungs/Pleura: Suggestion of underlying subtle changes of centrilobular emphysema. The peripheral ground-glass and confluent airspace disease seen on the previous exam has largely resolved in the interval although there is some persistent architectural distortion and interstitial scarring in the regions of previously noted lung disease. No suspicious pulmonary nodule or mass. No dense focal airspace consolidation. No evidence for pulmonary edema or pleural effusion. Upper Abdomen: Unremarkable. Musculoskeletal: No worrisome lytic or sclerotic osseous abnormality. Degenerative changes noted right shoulder. IMPRESSION: 1. Marked thinning of the inferior wall of the left ventricle towards the base. This is compatible with infarct although pseudoaneurysm cannot be excluded. 2. Interval resolution of the peripheral ground-glass and confluent airspace disease seen on the previous exam with some persistent  architectural distortion and interstitial opacity in the regions of previously noted lung disease. This may reflect incomplete resolution of changes related to the prior disease or chronic post infectious/inflammatory scarring. 3. No suspicious pulmonary nodule or mass. 4. Aortic Atherosclerosis (ICD10-I70.0). Electronically Signed   By: Misty Stanley M.D.   On: 07/06/2020 11:44   VAS [place] LOWER EXTREMITY SAPHENOUS VEIN MAPPING  Result Date: 07/06/2020 LOWER EXTREMITY VEIN MAPPING Other Indications: Pre-Op CABG Risk Factors:      Hypertension, hyperlipidemia, Diabetes.  Performing Technologist: Vonzell Schlatter RVT  Examination Guidelines: A complete evaluation includes B-mode imaging, spectral Doppler, color Doppler, and power Doppler as needed  of all accessible portions of each vessel. Bilateral testing is considered an integral part of a complete examination. Limited examinations for reoccurring indications may be performed as noted. +---------------+-----------+----------------------+---------------+-----------+   RT Diameter  RT Findings         GSV            LT Diameter  LT Findings      (cm)                                            (cm)                  +---------------+-----------+----------------------+---------------+-----------+      0.72                     Saphenofemoral         0.71                                                   Junction                                  +---------------+-----------+----------------------+---------------+-----------+      0.58                     Proximal thigh         0.57                  +---------------+-----------+----------------------+---------------+-----------+      0.43                       Mid thigh            0.51                  +---------------+-----------+----------------------+---------------+-----------+      0.43                      Distal thigh          0.42                   +---------------+-----------+----------------------+---------------+-----------+      0.40                          Knee              0.31                  +---------------+-----------+----------------------+---------------+-----------+      0.39       branching       Prox calf            0.36       branching  +---------------+-----------+----------------------+---------------+-----------+      0.23                        Mid calf            0.24       branching  +---------------+-----------+----------------------+---------------+-----------+      0.20  Distal calf           0.22       branching  +---------------+-----------+----------------------+---------------+-----------+      0.17                         Ankle              0.33                  +---------------+-----------+----------------------+---------------+-----------+ +----------------+-----------+---------------+----------------+--------------+ RT diameter (cm)RT Findings      SSV      LT Diameter (cm) LT Findings   +----------------+-----------+---------------+----------------+--------------+       0.21                 Popliteal fossa                not visualized +----------------+-----------+---------------+----------------+--------------+       0.20                  Proximal calf                 not visualized +----------------+-----------+---------------+----------------+--------------+       0.22                    Mid calf                    not visualized +----------------+-----------+---------------+----------------+--------------+       0.18                   Distal calf                  not visualized +----------------+-----------+---------------+----------------+--------------+ Diagnosing physician: Monica Martinez MD Electronically signed by Monica Martinez MD on 07/06/2020 at 5:00:35 PM.    Final     Cardiac Studies   Cath: 07/04/20   Ost LAD to  Prox LAD lesion is 40% stenosed.  2nd Diag lesion is 90% stenosed.  1st Diag-1 lesion is 95% stenosed.  1st Diag-2 lesion is 90% stenosed with 90% stenosed side branch in Lat 1st Diag.  Mid LAD-1 lesion is 90% stenosed.  Mid LAD-2 lesion is 70% stenosed.  Lat 2nd Mrg lesion is 95% stenosed.  1st Mrg lesion is 20% stenosed.  Prox RCA lesion is 95% stenosed.  Dist LAD lesion is 50% stenosed.  Severe coronary obstructive disease with significant calcification involving the LAD. The LAD is diffusely diseased extending from the proximal to mid segment with 40% diffuse proximal stenosis, calcified 90% stenosis on a bend in the vessel in the region of the first and second diagonal vessels with 95% stenoses in the ostium of the first diagonal and 90% in the second diagonal, 70% mid stenosis and 50% mid distal stenoses.  The circumflex vessel has 20% smooth narrowing in the first marginal branch with 95% focal stenosis in a branch of the second marginal vessel.  The RCA has a focal 95% mid stenosis with reduced antegrade distal flow secondary to competitive left-to-right collateralization.  Normal LV function with EF estimated at 55 mm; LVEDP 12 mm Hg.  RECOMMENDATION: Surgical consultation for CABG revascularization. Patient will need to hold Plavix and will need Plavix washout. Diagnostic Dominance: Right     Patient Profile     74 y.o. male with history of CAD, CVA, OSA,DVT/PE, HTN, DM 2, PAF who underwent outpatient Lexiscan myoview which was abnormal and set up for outpatient cardiac cath found to have  multivessel CAD now awaiting CABG.  Assessment & Plan    #Mutivessel CAD: Underwent cardiac cath due to abnormal myoview found to have severe multivessel disease. TCTS consulted with plans for CABG on 12/20 by Dr Lucianne Lei Trigtafter plavix washout. No significant chest pain. Pre CABG dopplers OK. -Continue ASA, statin, fenofibrate, Imdur and BB -Plan for CABG with CV  surgery tomorrow  #Hx of PE:  Has been treated with Eliquis for total of 6 months for DVT/PE secondary to Covid infection in May.  -Now off anticoagulation after 6 month course  #Hx of PAF: occurred in the setting of COVID PNA. He is currently in SR. No need for IV heparin at this time.  -Monitor on telemetry  #HLD:  -Continue Crestor and fenofibrate  #HTN: Well controlled. -Continue coreg and imdur as above  # LJ:QGBEEFE with empagliflozin/metformin (held during admission) and toujeo.  - HgB A1C 8 - SSI while inpatient - Will need aggressive diabetes control as out-patient with goal A1C<7  #CKD stage 3a: Back to baseline with Cr 1.36 today. -Renally dose medications -Trend  For questions or updates, please contact [place] Please consult [email or web address] for contact info under        Signed, Freada Bergeron, MD  07/08/2020, 9:31 AM

## 2020-07-08 LAB — URINALYSIS, ROUTINE W REFLEX MICROSCOPIC
Bacteria, UA: NONE SEEN
Bilirubin Urine: NEGATIVE
Glucose, UA: >=500 mg/dL — AB
Hgb urine dipstick: NEGATIVE
Ketones, ur: NEGATIVE mg/dL
Leukocytes,Ua: NEGATIVE
Nitrite: NEGATIVE
Protein, ur: NEGATIVE mg/dL
Specific Gravity, Urine: 1.023 (ref 1.005–1.030)
pH: 6 (ref 5.0–8.0)

## 2020-07-08 LAB — CBC
HCT: 32.9 % — ABNORMAL LOW (ref 39.0–52.0)
Hemoglobin: 10.9 g/dL — ABNORMAL LOW (ref 13.0–17.0)
MCH: 29.1 pg (ref 26.0–34.0)
MCHC: 33.1 g/dL (ref 30.0–36.0)
MCV: 88 fL (ref 80.0–100.0)
Platelets: 270 K/uL (ref 150–400)
RBC: 3.74 MIL/uL — ABNORMAL LOW (ref 4.22–5.81)
RDW: 14.7 % (ref 11.5–15.5)
WBC: 5.2 K/uL (ref 4.0–10.5)
nRBC: 0 % (ref 0.0–0.2)

## 2020-07-08 LAB — PREPARE RBC (CROSSMATCH)

## 2020-07-08 LAB — BASIC METABOLIC PANEL
Anion gap: 7 (ref 5–15)
BUN: 23 mg/dL (ref 8–23)
CO2: 24 mmol/L (ref 22–32)
Calcium: 8.8 mg/dL — ABNORMAL LOW (ref 8.9–10.3)
Chloride: 108 mmol/L (ref 98–111)
Creatinine, Ser: 1.36 mg/dL — ABNORMAL HIGH (ref 0.61–1.24)
GFR, Estimated: 54 mL/min — ABNORMAL LOW (ref >60)
Glucose, Bld: 144 mg/dL — ABNORMAL HIGH (ref 70–99)
Potassium: 4 mmol/L (ref 3.5–5.1)
Sodium: 139 mmol/L (ref 135–145)

## 2020-07-08 LAB — SURGICAL PCR SCREEN
MRSA, PCR: NEGATIVE
Staphylococcus aureus: NEGATIVE

## 2020-07-08 LAB — GLUCOSE, CAPILLARY
Glucose-Capillary: 138 mg/dL — ABNORMAL HIGH (ref 70–99)
Glucose-Capillary: 165 mg/dL — ABNORMAL HIGH (ref 70–99)
Glucose-Capillary: 354 mg/dL — ABNORMAL HIGH (ref 70–99)
Glucose-Capillary: 165 mg/dL — ABNORMAL HIGH (ref 70–99)
Glucose-Capillary: 233 mg/dL — ABNORMAL HIGH (ref 70–99)

## 2020-07-08 LAB — BLOOD GAS, ARTERIAL
Acid-base deficit: 1.2 mmol/L (ref 0.0–2.0)
Bicarbonate: 23 mmol/L (ref 20.0–28.0)
Drawn by: 336832
FIO2: 21
O2 Saturation: 94.4 %
Patient temperature: 37
pCO2 arterial: 38.2 mmHg (ref 32.0–48.0)
pH, Arterial: 7.397 (ref 7.350–7.450)
pO2, Arterial: 74.3 mmHg — ABNORMAL LOW (ref 83.0–108.0)

## 2020-07-08 MED ORDER — ACETAMINOPHEN 500 MG PO TABS
1000.0000 mg | ORAL_TABLET | Freq: Once | ORAL | Status: AC
  Administered 2020-07-08: 1000 mg via ORAL
  Filled 2020-07-08: qty 2

## 2020-07-08 MED ORDER — CHLORHEXIDINE GLUCONATE 4 % EX LIQD
60.0000 mL | Freq: Once | CUTANEOUS | Status: AC
  Administered 2020-07-08: 4 via TOPICAL
  Filled 2020-07-08: qty 60

## 2020-07-08 MED ORDER — CHLORHEXIDINE GLUCONATE 0.12 % MT SOLN
15.0000 mL | Freq: Once | OROMUCOSAL | Status: AC
  Administered 2020-07-09: 15 mL via OROMUCOSAL
  Filled 2020-07-08: qty 15

## 2020-07-08 MED ORDER — CHLORHEXIDINE GLUCONATE 4 % EX LIQD
60.0000 mL | Freq: Once | CUTANEOUS | Status: AC
  Administered 2020-07-09: 4 via TOPICAL
  Filled 2020-07-08: qty 60

## 2020-07-08 MED ORDER — TEMAZEPAM 15 MG PO CAPS
15.0000 mg | ORAL_CAPSULE | Freq: Once | ORAL | Status: AC | PRN
  Administered 2020-07-08: 15 mg via ORAL
  Filled 2020-07-08: qty 1

## 2020-07-08 MED ORDER — DIAZEPAM 2 MG PO TABS
2.0000 mg | ORAL_TABLET | Freq: Once | ORAL | Status: AC
  Administered 2020-07-09: 2 mg via ORAL
  Filled 2020-07-08: qty 1

## 2020-07-08 MED ORDER — BISACODYL 5 MG PO TBEC: 5.0000 mg | DELAYED_RELEASE_TABLET | Freq: Once | ORAL | Status: DC

## 2020-07-08 MED ORDER — METOPROLOL TARTRATE 12.5 MG HALF TABLET
12.5000 mg | ORAL_TABLET | Freq: Once | ORAL | Status: AC
  Administered 2020-07-09: 12.5 mg via ORAL
  Filled 2020-07-08: qty 1

## 2020-07-08 NOTE — Anesthesia Preprocedure Evaluation (Addendum)
Anesthesia Evaluation  Patient identified by MRN, date of birth, ID band Patient awake    Reviewed: Allergy & Precautions, H&P , NPO status , Patient's Chart, lab work & pertinent test results, reviewed documented beta blocker date and time   Airway Mallampati: II  TM Distance: >3 FB Neck ROM: Full    Dental no notable dental hx. (+) Teeth Intact, Dental Advisory Given   Pulmonary sleep apnea ,    Pulmonary exam normal breath sounds clear to auscultation       Cardiovascular Exercise Tolerance: Good hypertension, Pt. on medications and Pt. on home beta blockers + CAD and + Past MI   Rhythm:Regular Rate:Normal     Neuro/Psych Depression CVA    GI/Hepatic negative GI ROS, Neg liver ROS,   Endo/Other  diabetes, Insulin Dependent  Renal/GU Renal disease  negative genitourinary   Musculoskeletal  (+) Arthritis , Osteoarthritis,    Abdominal   Peds  Hematology  (+) Blood dyscrasia, anemia ,   Anesthesia Other Findings   Reproductive/Obstetrics negative OB ROS                           Anesthesia Physical Anesthesia Plan  ASA: IV  Anesthesia Plan: General   Post-op Pain Management:    Induction: Intravenous  PONV Risk Score and Plan: 2 and Midazolam and Ondansetron  Airway Management Planned: Oral ETT  Additional Equipment: Arterial line, CVP, PA Cath, TEE and Ultrasound Guidance Line Placement  Intra-op Plan:   Post-operative Plan: Post-operative intubation/ventilation  Informed Consent: I have reviewed the patients History and Physical, chart, labs and discussed the procedure including the risks, benefits and alternatives for the proposed anesthesia with the patient or authorized representative who has indicated his/her understanding and acceptance.     Dental advisory given  Plan Discussed with: CRNA  Anesthesia Plan Comments:         Anesthesia Quick Evaluation

## 2020-07-08 NOTE — Plan of Care (Signed)
  Problem: Education: Goal: Knowledge of General Education information will improve Description Including pain rating scale, medication(s)/side effects and non-pharmacologic comfort measures Outcome: Progressing   Problem: Health Behavior/Discharge Planning: Goal: Ability to manage health-related needs will improve Outcome: Progressing   

## 2020-07-09 ENCOUNTER — Inpatient Hospital Stay (HOSPITAL_COMMUNITY): Payer: Medicare Other | Admitting: Anesthesiology

## 2020-07-09 ENCOUNTER — Inpatient Hospital Stay (HOSPITAL_COMMUNITY)
Admission: RE | Disposition: A | Discharge status: Home Health | Payer: Self-pay | Source: Home / Self Care | Attending: Cardiothoracic Surgery

## 2020-07-09 ENCOUNTER — Inpatient Hospital Stay (HOSPITAL_COMMUNITY): Payer: Medicare Other

## 2020-07-09 ENCOUNTER — Encounter (HOSPITAL_COMMUNITY): Payer: Self-pay | Admitting: Cardiovascular Disease

## 2020-07-09 DIAGNOSIS — Z951 Presence of aortocoronary bypass graft: Secondary | ICD-10-CM

## 2020-07-09 HISTORY — PX: TEE WITHOUT CARDIOVERSION: SHX5443

## 2020-07-09 HISTORY — PX: CORONARY ARTERY BYPASS GRAFT: SHX141

## 2020-07-09 LAB — HEMOGLOBIN AND HEMATOCRIT, BLOOD
HCT: 26.9 % — ABNORMAL LOW (ref 39.0–52.0)
Hemoglobin: 8.6 g/dL — ABNORMAL LOW (ref 13.0–17.0)

## 2020-07-09 LAB — GLUCOSE, CAPILLARY
Glucose-Capillary: 174 mg/dL — ABNORMAL HIGH (ref 70–99)
Glucose-Capillary: 136 mg/dL — ABNORMAL HIGH (ref 70–99)
Glucose-Capillary: 126 mg/dL — ABNORMAL HIGH (ref 70–99)
Glucose-Capillary: 120 mg/dL — ABNORMAL HIGH (ref 70–99)
Glucose-Capillary: 128 mg/dL — ABNORMAL HIGH (ref 70–99)
Glucose-Capillary: 141 mg/dL — ABNORMAL HIGH (ref 70–99)
Glucose-Capillary: 156 mg/dL — ABNORMAL HIGH (ref 70–99)
Glucose-Capillary: 155 mg/dL — ABNORMAL HIGH (ref 70–99)
Glucose-Capillary: 182 mg/dL — ABNORMAL HIGH (ref 70–99)

## 2020-07-09 LAB — CBC
HCT: 37.2 % — ABNORMAL LOW (ref 39.0–52.0)
HCT: 26.9 % — ABNORMAL LOW (ref 39.0–52.0)
HCT: 25.9 % — ABNORMAL LOW (ref 39.0–52.0)
Hemoglobin: 11.8 g/dL — ABNORMAL LOW (ref 13.0–17.0)
Hemoglobin: 8.7 g/dL — ABNORMAL LOW (ref 13.0–17.0)
Hemoglobin: 8.3 g/dL — ABNORMAL LOW (ref 13.0–17.0)
MCH: 28.6 pg (ref 26.0–34.0)
MCH: 28.8 pg (ref 26.0–34.0)
MCH: 28.8 pg (ref 26.0–34.0)
MCHC: 31.7 g/dL (ref 30.0–36.0)
MCHC: 32.3 g/dL (ref 30.0–36.0)
MCHC: 32 g/dL (ref 30.0–36.0)
MCV: 90.1 fL (ref 80.0–100.0)
MCV: 89.1 fL (ref 80.0–100.0)
MCV: 89.9 fL (ref 80.0–100.0)
Platelets: 293 10*3/uL (ref 150–400)
Platelets: 149 10*3/uL — ABNORMAL LOW (ref 150–400)
Platelets: 176 10*3/uL (ref 150–400)
RBC: 4.13 MIL/uL — ABNORMAL LOW (ref 4.22–5.81)
RBC: 3.02 MIL/uL — ABNORMAL LOW (ref 4.22–5.81)
RBC: 2.88 MIL/uL — ABNORMAL LOW (ref 4.22–5.81)
RDW: 14.6 % (ref 11.5–15.5)
RDW: 14.4 % (ref 11.5–15.5)
RDW: 14.7 % (ref 11.5–15.5)
WBC: 5.2 10*3/uL (ref 4.0–10.5)
WBC: 5.8 10*3/uL (ref 4.0–10.5)
WBC: 6.8 10*3/uL (ref 4.0–10.5)
nRBC: 0 % (ref 0.0–0.2)
nRBC: 0 % (ref 0.0–0.2)
nRBC: 0 % (ref 0.0–0.2)

## 2020-07-09 LAB — POCT I-STAT 7, (LYTES, BLD GAS, ICA,H+H)
Acid-Base Excess: 1 mmol/L (ref 0.0–2.0)
Acid-Base Excess: 0 mmol/L (ref 0.0–2.0)
Acid-Base Excess: 1 mmol/L (ref 0.0–2.0)
Acid-Base Excess: 0 mmol/L (ref 0.0–2.0)
Acid-base deficit: 1 mmol/L (ref 0.0–2.0)
Acid-base deficit: 5 mmol/L — ABNORMAL HIGH (ref 0.0–2.0)
Acid-base deficit: 5 mmol/L — ABNORMAL HIGH (ref 0.0–2.0)
Bicarbonate: 27 mmol/L (ref 20.0–28.0)
Bicarbonate: 25.1 mmol/L (ref 20.0–28.0)
Bicarbonate: 26.2 mmol/L (ref 20.0–28.0)
Bicarbonate: 23.8 mmol/L (ref 20.0–28.0)
Bicarbonate: 24.6 mmol/L (ref 20.0–28.0)
Bicarbonate: 20.4 mmol/L (ref 20.0–28.0)
Bicarbonate: 19.9 mmol/L — ABNORMAL LOW (ref 20.0–28.0)
Calcium, Ion: 1.1 mmol/L — ABNORMAL LOW (ref 1.15–1.40)
Calcium, Ion: 1.01 mmol/L — ABNORMAL LOW (ref 1.15–1.40)
Calcium, Ion: 1.08 mmol/L — ABNORMAL LOW (ref 1.15–1.40)
Calcium, Ion: 1.11 mmol/L — ABNORMAL LOW (ref 1.15–1.40)
Calcium, Ion: 1.12 mmol/L — ABNORMAL LOW (ref 1.15–1.40)
Calcium, Ion: 1.16 mmol/L (ref 1.15–1.40)
Calcium, Ion: 1.17 mmol/L (ref 1.15–1.40)
HCT: 26 % — ABNORMAL LOW (ref 39.0–52.0)
HCT: 23 % — ABNORMAL LOW (ref 39.0–52.0)
HCT: 26 % — ABNORMAL LOW (ref 39.0–52.0)
HCT: 27 % — ABNORMAL LOW (ref 39.0–52.0)
HCT: 25 % — ABNORMAL LOW (ref 39.0–52.0)
HCT: 24 % — ABNORMAL LOW (ref 39.0–52.0)
HCT: 23 % — ABNORMAL LOW (ref 39.0–52.0)
Hemoglobin: 8.8 g/dL — ABNORMAL LOW (ref 13.0–17.0)
Hemoglobin: 7.8 g/dL — ABNORMAL LOW (ref 13.0–17.0)
Hemoglobin: 8.8 g/dL — ABNORMAL LOW (ref 13.0–17.0)
Hemoglobin: 9.2 g/dL — ABNORMAL LOW (ref 13.0–17.0)
Hemoglobin: 8.5 g/dL — ABNORMAL LOW (ref 13.0–17.0)
Hemoglobin: 8.2 g/dL — ABNORMAL LOW (ref 13.0–17.0)
Hemoglobin: 7.8 g/dL — ABNORMAL LOW (ref 13.0–17.0)
O2 Saturation: 100 %
O2 Saturation: 100 %
O2 Saturation: 100 %
O2 Saturation: 99 %
O2 Saturation: 98 %
O2 Saturation: 98 %
O2 Saturation: 92 %
Patient temperature: 35.9
Patient temperature: 37.9
Patient temperature: 38.7
Potassium: 4 mmol/L (ref 3.5–5.1)
Potassium: 4.5 mmol/L (ref 3.5–5.1)
Potassium: 4.3 mmol/L (ref 3.5–5.1)
Potassium: 4 mmol/L (ref 3.5–5.1)
Potassium: 3.5 mmol/L (ref 3.5–5.1)
Potassium: 4.3 mmol/L (ref 3.5–5.1)
Potassium: 4.1 mmol/L (ref 3.5–5.1)
Sodium: 141 mmol/L (ref 135–145)
Sodium: 134 mmol/L — ABNORMAL LOW (ref 135–145)
Sodium: 138 mmol/L (ref 135–145)
Sodium: 139 mmol/L (ref 135–145)
Sodium: 141 mmol/L (ref 135–145)
Sodium: 141 mmol/L (ref 135–145)
Sodium: 139 mmol/L (ref 135–145)
TCO2: 29 mmol/L (ref 22–32)
TCO2: 26 mmol/L (ref 22–32)
TCO2: 27 mmol/L (ref 22–32)
TCO2: 25 mmol/L (ref 22–32)
TCO2: 26 mmol/L (ref 22–32)
TCO2: 22 mmol/L (ref 22–32)
TCO2: 21 mmol/L — ABNORMAL LOW (ref 22–32)
pCO2 arterial: 51 mmHg — ABNORMAL HIGH (ref 32.0–48.0)
pCO2 arterial: 39.8 mmHg (ref 32.0–48.0)
pCO2 arterial: 43.1 mmHg (ref 32.0–48.0)
pCO2 arterial: 41.6 mmHg (ref 32.0–48.0)
pCO2 arterial: 39.7 mmHg (ref 32.0–48.0)
pCO2 arterial: 39.1 mmHg (ref 32.0–48.0)
pCO2 arterial: 38.5 mmHg (ref 32.0–48.0)
pH, Arterial: 7.332 — ABNORMAL LOW (ref 7.350–7.450)
pH, Arterial: 7.407 (ref 7.350–7.450)
pH, Arterial: 7.392 (ref 7.350–7.450)
pH, Arterial: 7.366 (ref 7.350–7.450)
pH, Arterial: 7.396 (ref 7.350–7.450)
pH, Arterial: 7.33 — ABNORMAL LOW (ref 7.350–7.450)
pH, Arterial: 7.329 — ABNORMAL LOW (ref 7.350–7.450)
pO2, Arterial: 319 mmHg — ABNORMAL HIGH (ref 83.0–108.0)
pO2, Arterial: 322 mmHg — ABNORMAL HIGH (ref 83.0–108.0)
pO2, Arterial: 311 mmHg — ABNORMAL HIGH (ref 83.0–108.0)
pO2, Arterial: 150 mmHg — ABNORMAL HIGH (ref 83.0–108.0)
pO2, Arterial: 100 mmHg (ref 83.0–108.0)
pO2, Arterial: 116 mmHg — ABNORMAL HIGH (ref 83.0–108.0)
pO2, Arterial: 73 mmHg — ABNORMAL LOW (ref 83.0–108.0)

## 2020-07-09 LAB — POCT I-STAT, CHEM 8
BUN: 24 mg/dL — ABNORMAL HIGH (ref 8–23)
BUN: 24 mg/dL — ABNORMAL HIGH (ref 8–23)
BUN: 22 mg/dL (ref 8–23)
BUN: 21 mg/dL (ref 8–23)
BUN: 21 mg/dL (ref 8–23)
BUN: 21 mg/dL (ref 8–23)
Calcium, Ion: 1.33 mmol/L (ref 1.15–1.40)
Calcium, Ion: 1.34 mmol/L (ref 1.15–1.40)
Calcium, Ion: 1.05 mmol/L — ABNORMAL LOW (ref 1.15–1.40)
Calcium, Ion: 1.09 mmol/L — ABNORMAL LOW (ref 1.15–1.40)
Calcium, Ion: 1.07 mmol/L — ABNORMAL LOW (ref 1.15–1.40)
Calcium, Ion: 1.1 mmol/L — ABNORMAL LOW (ref 1.15–1.40)
Chloride: 104 mmol/L (ref 98–111)
Chloride: 105 mmol/L (ref 98–111)
Chloride: 103 mmol/L (ref 98–111)
Chloride: 101 mmol/L (ref 98–111)
Chloride: 102 mmol/L (ref 98–111)
Chloride: 103 mmol/L (ref 98–111)
Creatinine, Ser: 1.1 mg/dL (ref 0.61–1.24)
Creatinine, Ser: 1 mg/dL (ref 0.61–1.24)
Creatinine, Ser: 0.9 mg/dL (ref 0.61–1.24)
Creatinine, Ser: 0.9 mg/dL (ref 0.61–1.24)
Creatinine, Ser: 0.8 mg/dL (ref 0.61–1.24)
Creatinine, Ser: 1 mg/dL (ref 0.61–1.24)
Glucose, Bld: 136 mg/dL — ABNORMAL HIGH (ref 70–99)
Glucose, Bld: 119 mg/dL — ABNORMAL HIGH (ref 70–99)
Glucose, Bld: 108 mg/dL — ABNORMAL HIGH (ref 70–99)
Glucose, Bld: 117 mg/dL — ABNORMAL HIGH (ref 70–99)
Glucose, Bld: 137 mg/dL — ABNORMAL HIGH (ref 70–99)
Glucose, Bld: 140 mg/dL — ABNORMAL HIGH (ref 70–99)
HCT: 32 % — ABNORMAL LOW (ref 39.0–52.0)
HCT: 33 % — ABNORMAL LOW (ref 39.0–52.0)
HCT: 27 % — ABNORMAL LOW (ref 39.0–52.0)
HCT: 26 % — ABNORMAL LOW (ref 39.0–52.0)
HCT: 26 % — ABNORMAL LOW (ref 39.0–52.0)
HCT: 27 % — ABNORMAL LOW (ref 39.0–52.0)
Hemoglobin: 10.9 g/dL — ABNORMAL LOW (ref 13.0–17.0)
Hemoglobin: 11.2 g/dL — ABNORMAL LOW (ref 13.0–17.0)
Hemoglobin: 9.2 g/dL — ABNORMAL LOW (ref 13.0–17.0)
Hemoglobin: 8.8 g/dL — ABNORMAL LOW (ref 13.0–17.0)
Hemoglobin: 8.8 g/dL — ABNORMAL LOW (ref 13.0–17.0)
Hemoglobin: 9.2 g/dL — ABNORMAL LOW (ref 13.0–17.0)
Potassium: 3.8 mmol/L (ref 3.5–5.1)
Potassium: 4.3 mmol/L (ref 3.5–5.1)
Potassium: 4.6 mmol/L (ref 3.5–5.1)
Potassium: 4.8 mmol/L (ref 3.5–5.1)
Potassium: 4 mmol/L (ref 3.5–5.1)
Potassium: 4.5 mmol/L (ref 3.5–5.1)
Sodium: 140 mmol/L (ref 135–145)
Sodium: 140 mmol/L (ref 135–145)
Sodium: 138 mmol/L (ref 135–145)
Sodium: 136 mmol/L (ref 135–145)
Sodium: 137 mmol/L (ref 135–145)
Sodium: 138 mmol/L (ref 135–145)
TCO2: 24 mmol/L (ref 22–32)
TCO2: 26 mmol/L (ref 22–32)
TCO2: 25 mmol/L (ref 22–32)
TCO2: 23 mmol/L (ref 22–32)
TCO2: 22 mmol/L (ref 22–32)
TCO2: 24 mmol/L (ref 22–32)

## 2020-07-09 LAB — BASIC METABOLIC PANEL
Anion gap: 8 (ref 5–15)
Anion gap: 9 (ref 5–15)
BUN: 25 mg/dL — ABNORMAL HIGH (ref 8–23)
BUN: 20 mg/dL (ref 8–23)
CO2: 24 mmol/L (ref 22–32)
CO2: 19 mmol/L — ABNORMAL LOW (ref 22–32)
Calcium: 9.2 mg/dL (ref 8.9–10.3)
Calcium: 7.6 mg/dL — ABNORMAL LOW (ref 8.9–10.3)
Chloride: 106 mmol/L (ref 98–111)
Chloride: 109 mmol/L (ref 98–111)
Creatinine, Ser: 1.36 mg/dL — ABNORMAL HIGH (ref 0.61–1.24)
Creatinine, Ser: 1.2 mg/dL (ref 0.61–1.24)
GFR, Estimated: 54 mL/min — ABNORMAL LOW (ref >60)
GFR, Estimated: >60 mL/min (ref >60)
Glucose, Bld: 169 mg/dL — ABNORMAL HIGH (ref 70–99)
Glucose, Bld: 159 mg/dL — ABNORMAL HIGH (ref 70–99)
Potassium: 4 mmol/L (ref 3.5–5.1)
Potassium: 4.3 mmol/L (ref 3.5–5.1)
Sodium: 138 mmol/L (ref 135–145)
Sodium: 137 mmol/L (ref 135–145)

## 2020-07-09 LAB — POCT I-STAT EG7
Acid-Base Excess: 0 mmol/L (ref 0.0–2.0)
Bicarbonate: 26.5 mmol/L (ref 20.0–28.0)
Calcium, Ion: 1.12 mmol/L — ABNORMAL LOW (ref 1.15–1.40)
HCT: 26 % — ABNORMAL LOW (ref 39.0–52.0)
Hemoglobin: 8.8 g/dL — ABNORMAL LOW (ref 13.0–17.0)
O2 Saturation: 78 %
Potassium: 4.2 mmol/L (ref 3.5–5.1)
Sodium: 141 mmol/L (ref 135–145)
TCO2: 28 mmol/L (ref 22–32)
pCO2, Ven: 54.3 mmHg (ref 44.0–60.0)
pH, Ven: 7.296 (ref 7.250–7.430)
pO2, Ven: 48 mmHg — ABNORMAL HIGH (ref 32.0–45.0)

## 2020-07-09 LAB — APTT
aPTT: 29 seconds (ref 24–36)
aPTT: 32 seconds (ref 24–36)

## 2020-07-09 LAB — ECHO INTRAOPERATIVE TEE
Height: 70.5 in
Weight: 2758.4 oz

## 2020-07-09 LAB — PROTIME-INR
INR: 1.3 — ABNORMAL HIGH (ref 0.8–1.2)
Prothrombin Time: 16.1 seconds — ABNORMAL HIGH (ref 11.4–15.2)

## 2020-07-09 LAB — PLATELET COUNT: Platelets: 174 10*3/uL (ref 150–400)

## 2020-07-09 LAB — PREPARE RBC (CROSSMATCH)

## 2020-07-09 SURGERY — CORONARY ARTERY BYPASS GRAFTING (CABG)
Anesthesia: General | Site: Chest

## 2020-07-09 MED ORDER — PHENYLEPHRINE HCL-NACL 20-0.9 MG/250ML-% IV SOLN: 0.0000 ug/min | INTRAVENOUS | Status: DC

## 2020-07-09 MED ORDER — SODIUM CHLORIDE 0.9% FLUSH
3.0000 mL | Freq: Two times a day (BID) | INTRAVENOUS | Status: DC
  Administered 2020-07-10 – 2020-07-15 (×10): 3 mL via INTRAVENOUS

## 2020-07-09 MED ORDER — LACTATED RINGERS IV SOLN: 500.0000 mL | Freq: Once | INTRAVENOUS | Status: DC | PRN

## 2020-07-09 MED ORDER — METOPROLOL TARTRATE 25 MG/10 ML ORAL SUSPENSION: 12.5000 mg | Freq: Two times a day (BID) | ORAL | Status: DC

## 2020-07-09 MED ORDER — CHLORHEXIDINE GLUCONATE CLOTH 2 % EX PADS
6.0000 | MEDICATED_PAD | Freq: Every day | CUTANEOUS | Status: DC
  Administered 2020-07-09 – 2020-07-16 (×7): 6 via TOPICAL

## 2020-07-09 MED ORDER — MIDAZOLAM HCL 5 MG/5ML IJ SOLN
INTRAMUSCULAR | Status: DC | PRN
  Administered 2020-07-09: 1 mg via INTRAVENOUS
  Administered 2020-07-09: 2 mg via INTRAVENOUS
  Administered 2020-07-09: 4 mg via INTRAVENOUS

## 2020-07-09 MED ORDER — HEPARIN SODIUM (PORCINE) 1000 UNIT/ML IJ SOLN
INTRAMUSCULAR | Status: DC | PRN
  Administered 2020-07-09 (×2): 2000 [IU] via INTRAVENOUS
  Administered 2020-07-09: 21000 [IU] via INTRAVENOUS

## 2020-07-09 MED ORDER — SODIUM CHLORIDE (PF) 0.9 % IJ SOLN
OROMUCOSAL | Status: DC | PRN
  Administered 2020-07-09 (×3): 4 mL via TOPICAL

## 2020-07-09 MED ORDER — DOCUSATE SODIUM 100 MG PO CAPS
200.0000 mg | ORAL_CAPSULE | Freq: Every day | ORAL | Status: DC
  Administered 2020-07-10 – 2020-07-13 (×4): 200 mg via ORAL
  Filled 2020-07-09 (×6): qty 2

## 2020-07-09 MED ORDER — SODIUM CHLORIDE 0.9% FLUSH: 3.0000 mL | INTRAVENOUS | Status: DC | PRN

## 2020-07-09 MED ORDER — ONDANSETRON HCL 4 MG/2ML IJ SOLN: 4.0000 mg | Freq: Four times a day (QID) | INTRAMUSCULAR | Status: DC | PRN

## 2020-07-09 MED ORDER — ASPIRIN 81 MG PO CHEW: 324.0000 mg | CHEWABLE_TABLET | Freq: Every day | ORAL | Status: DC

## 2020-07-09 MED ORDER — HEPARIN SODIUM (PORCINE) 1000 UNIT/ML IJ SOLN
INTRAMUSCULAR | Status: AC
  Filled 2020-07-09: qty 1

## 2020-07-09 MED ORDER — LACTATED RINGERS IV SOLN: INTRAVENOUS | Status: DC

## 2020-07-09 MED ORDER — CHLORHEXIDINE GLUCONATE 0.12 % MT SOLN
15.0000 mL | OROMUCOSAL | Status: AC
  Administered 2020-07-09: 15 mL via OROMUCOSAL

## 2020-07-09 MED ORDER — SODIUM CHLORIDE 0.9 % IV SOLN
20.0000 ug | INTRAVENOUS | Status: AC
  Administered 2020-07-09: 20 ug via INTRAVENOUS
  Filled 2020-07-09: qty 5

## 2020-07-09 MED ORDER — SODIUM CHLORIDE 0.45 % IV SOLN: INTRAVENOUS | Status: DC | PRN

## 2020-07-09 MED ORDER — ACETAMINOPHEN 500 MG PO TABS
1000.0000 mg | ORAL_TABLET | Freq: Four times a day (QID) | ORAL | Status: AC
  Administered 2020-07-10 – 2020-07-14 (×18): 1000 mg via ORAL
  Filled 2020-07-09 (×18): qty 2

## 2020-07-09 MED ORDER — METOPROLOL TARTRATE 12.5 MG HALF TABLET
12.5000 mg | ORAL_TABLET | Freq: Two times a day (BID) | ORAL | Status: DC
  Administered 2020-07-10 – 2020-07-11 (×5): 12.5 mg via ORAL
  Filled 2020-07-09 (×5): qty 1

## 2020-07-09 MED ORDER — PROPOFOL 10 MG/ML IV BOLUS
INTRAVENOUS | Status: DC | PRN
  Administered 2020-07-09: 50 mg via INTRAVENOUS

## 2020-07-09 MED ORDER — VANCOMYCIN HCL 1000 MG IV SOLR
INTRAVENOUS | Status: DC | PRN
  Administered 2020-07-09: 1250 mg via INTRAVENOUS

## 2020-07-09 MED ORDER — TRAMADOL HCL 50 MG PO TABS
50.0000 mg | ORAL_TABLET | ORAL | Status: DC | PRN
  Administered 2020-07-10: 50 mg via ORAL
  Administered 2020-07-10: 100 mg via ORAL
  Administered 2020-07-11 – 2020-07-13 (×4): 50 mg via ORAL
  Filled 2020-07-09: qty 2
  Filled 2020-07-09 (×5): qty 1

## 2020-07-09 MED ORDER — MILRINONE LACTATE IN DEXTROSE 20-5 MG/100ML-% IV SOLN
0.1250 ug/kg/min | INTRAVENOUS | Status: DC
  Administered 2020-07-10: 0.25 ug/kg/min via INTRAVENOUS
  Administered 2020-07-11: 0.125 ug/kg/min via INTRAVENOUS
  Filled 2020-07-09 (×2): qty 100

## 2020-07-09 MED ORDER — MAGNESIUM SULFATE 4 GM/100ML IV SOLN
4.0000 g | Freq: Once | INTRAVENOUS | Status: AC
  Administered 2020-07-09: 4 g via INTRAVENOUS
  Filled 2020-07-09: qty 100

## 2020-07-09 MED ORDER — SODIUM CHLORIDE 0.9 % IV SOLN: INTRAVENOUS | Status: DC | PRN

## 2020-07-09 MED ORDER — PROTAMINE SULFATE 10 MG/ML IV SOLN
INTRAVENOUS | Status: DC | PRN
  Administered 2020-07-09: 220 mg via INTRAVENOUS

## 2020-07-09 MED ORDER — POTASSIUM CHLORIDE 10 MEQ/50ML IV SOLN
10.0000 meq | INTRAVENOUS | Status: AC
Start: 2020-07-09 — End: 2020-07-09
  Administered 2020-07-09 (×3): 10 meq via INTRAVENOUS

## 2020-07-09 MED ORDER — LACTATED RINGERS IV SOLN: INTRAVENOUS | Status: DC | PRN

## 2020-07-09 MED ORDER — FAMOTIDINE IN NACL 20-0.9 MG/50ML-% IV SOLN
20.0000 mg | Freq: Two times a day (BID) | INTRAVENOUS | Status: AC
  Administered 2020-07-09 (×2): 20 mg via INTRAVENOUS
  Filled 2020-07-09 (×2): qty 50

## 2020-07-09 MED ORDER — PLASMA-LYTE 148 IV SOLN
INTRAVENOUS | Status: DC | PRN
  Administered 2020-07-09: 500 mL via INTRAVASCULAR

## 2020-07-09 MED ORDER — ACETAMINOPHEN 160 MG/5ML PO SOLN
1000.0000 mg | Freq: Four times a day (QID) | ORAL | Status: AC
  Administered 2020-07-13: 1000 mg
  Filled 2020-07-09: qty 40.6

## 2020-07-09 MED ORDER — OXYCODONE HCL 5 MG PO TABS
5.0000 mg | ORAL_TABLET | ORAL | Status: DC | PRN
  Administered 2020-07-10 – 2020-07-11 (×4): 5 mg via ORAL
  Filled 2020-07-09 (×4): qty 1

## 2020-07-09 MED ORDER — PANTOPRAZOLE SODIUM 40 MG PO TBEC
40.0000 mg | DELAYED_RELEASE_TABLET | Freq: Every day | ORAL | Status: DC
  Administered 2020-07-11: 40 mg via ORAL
  Filled 2020-07-09: qty 1

## 2020-07-09 MED ORDER — DEXMEDETOMIDINE HCL IN NACL 400 MCG/100ML IV SOLN: 0.0000 ug/kg/h | INTRAVENOUS | Status: DC

## 2020-07-09 MED ORDER — NITROGLYCERIN IN D5W 200-5 MCG/ML-% IV SOLN: 0.0000 ug/min | INTRAVENOUS | Status: DC

## 2020-07-09 MED ORDER — SODIUM CHLORIDE 0.9% IV SOLUTION: Freq: Once | INTRAVENOUS | Status: DC

## 2020-07-09 MED ORDER — PROPOFOL 10 MG/ML IV BOLUS
INTRAVENOUS | Status: AC
  Filled 2020-07-09: qty 20

## 2020-07-09 MED ORDER — ACETAMINOPHEN 650 MG RE SUPP
650.0000 mg | Freq: Once | RECTAL | Status: AC
  Administered 2020-07-09: 650 mg via RECTAL

## 2020-07-09 MED ORDER — SODIUM CHLORIDE 0.9 % IV SOLN: 250.0000 mL | INTRAVENOUS | Status: DC

## 2020-07-09 MED ORDER — INSULIN REGULAR(HUMAN) IN NACL 100-0.9 UT/100ML-% IV SOLN: INTRAVENOUS | Status: DC

## 2020-07-09 MED ORDER — FENTANYL CITRATE (PF) 250 MCG/5ML IJ SOLN
INTRAMUSCULAR | Status: AC
  Filled 2020-07-09: qty 25

## 2020-07-09 MED ORDER — ROCURONIUM BROMIDE 10 MG/ML (PF) SYRINGE
PREFILLED_SYRINGE | INTRAVENOUS | Status: DC | PRN
  Administered 2020-07-09 (×3): 50 mg via INTRAVENOUS
  Administered 2020-07-09: 100 mg via INTRAVENOUS

## 2020-07-09 MED ORDER — VANCOMYCIN HCL IN DEXTROSE 1-5 GM/200ML-% IV SOLN
1000.0000 mg | Freq: Once | INTRAVENOUS | Status: AC
  Administered 2020-07-09: 1000 mg via INTRAVENOUS
  Filled 2020-07-09: qty 200

## 2020-07-09 MED ORDER — PROTAMINE SULFATE 10 MG/ML IV SOLN
INTRAVENOUS | Status: AC
  Filled 2020-07-09: qty 25

## 2020-07-09 MED ORDER — METOPROLOL TARTRATE 5 MG/5ML IV SOLN
2.5000 mg | INTRAVENOUS | Status: DC | PRN
  Administered 2020-07-10 – 2020-07-12 (×4): 5 mg via INTRAVENOUS
  Filled 2020-07-09 (×4): qty 5

## 2020-07-09 MED ORDER — SODIUM CHLORIDE 0.9 % IV SOLN
1.5000 g | Freq: Two times a day (BID) | INTRAVENOUS | Status: AC
Start: 2020-07-09 — End: 2020-07-11
  Administered 2020-07-10 – 2020-07-11 (×4): 1.5 g via INTRAVENOUS
  Filled 2020-07-09 (×4): qty 1.5

## 2020-07-09 MED ORDER — ASPIRIN EC 325 MG PO TBEC
325.0000 mg | DELAYED_RELEASE_TABLET | Freq: Every day | ORAL | Status: DC
  Administered 2020-07-10 – 2020-07-16 (×7): 325 mg via ORAL
  Filled 2020-07-09 (×7): qty 1

## 2020-07-09 MED ORDER — SODIUM CHLORIDE 0.9 % IV SOLN
INTRAVENOUS | Status: DC
  Administered 2020-07-09: 10 mL/h via INTRAVENOUS

## 2020-07-09 MED ORDER — MIDAZOLAM HCL 2 MG/2ML IJ SOLN: 2.0000 mg | INTRAMUSCULAR | Status: DC | PRN

## 2020-07-09 MED ORDER — PHENYLEPHRINE HCL (PRESSORS) 10 MG/ML IV SOLN
INTRAVENOUS | Status: DC | PRN
  Administered 2020-07-09 (×3): 80 ug via INTRAVENOUS

## 2020-07-09 MED ORDER — FENTANYL CITRATE (PF) 100 MCG/2ML IJ SOLN
25.0000 ug | INTRAMUSCULAR | Status: AC | PRN
  Administered 2020-07-09 (×3): 25 ug via INTRAVENOUS
  Filled 2020-07-09 (×3): qty 2

## 2020-07-09 MED ORDER — MIDAZOLAM HCL (PF) 10 MG/2ML IJ SOLN
INTRAMUSCULAR | Status: AC
  Filled 2020-07-09: qty 2

## 2020-07-09 MED ORDER — BISACODYL 5 MG PO TBEC
10.0000 mg | DELAYED_RELEASE_TABLET | Freq: Every day | ORAL | Status: DC
  Administered 2020-07-10 – 2020-07-13 (×4): 10 mg via ORAL
  Filled 2020-07-09 (×6): qty 2

## 2020-07-09 MED ORDER — 0.9 % SODIUM CHLORIDE (POUR BTL) OPTIME
TOPICAL | Status: DC | PRN
  Administered 2020-07-09: 6000 mL

## 2020-07-09 MED ORDER — BISACODYL 10 MG RE SUPP: 10.0000 mg | Freq: Every day | RECTAL | Status: DC

## 2020-07-09 MED ORDER — DEXMEDETOMIDINE (PRECEDEX) IN NS 20 MCG/5ML (4 MCG/ML) IV SYRINGE
PREFILLED_SYRINGE | INTRAVENOUS | Status: AC
  Filled 2020-07-09: qty 10

## 2020-07-09 MED ORDER — METOCLOPRAMIDE HCL 5 MG/ML IJ SOLN
10.0000 mg | Freq: Four times a day (QID) | INTRAMUSCULAR | Status: DC
  Administered 2020-07-10 – 2020-07-11 (×6): 10 mg via INTRAVENOUS
  Filled 2020-07-09 (×5): qty 2

## 2020-07-09 MED ORDER — ROCURONIUM BROMIDE 10 MG/ML (PF) SYRINGE
PREFILLED_SYRINGE | INTRAVENOUS | Status: AC
  Filled 2020-07-09: qty 10

## 2020-07-09 MED ORDER — FENTANYL CITRATE (PF) 250 MCG/5ML IJ SOLN
INTRAMUSCULAR | Status: DC | PRN
  Administered 2020-07-09 (×2): 100 ug via INTRAVENOUS
  Administered 2020-07-09: 150 ug via INTRAVENOUS
  Administered 2020-07-09: 200 ug via INTRAVENOUS
  Administered 2020-07-09: 150 ug via INTRAVENOUS
  Administered 2020-07-09 (×2): 100 ug via INTRAVENOUS
  Administered 2020-07-09: 150 ug via INTRAVENOUS
  Administered 2020-07-09: 50 ug via INTRAVENOUS
  Administered 2020-07-09: 150 ug via INTRAVENOUS

## 2020-07-09 MED ORDER — DEXTROSE 50 % IV SOLN: 0.0000 mL | INTRAVENOUS | Status: DC | PRN

## 2020-07-09 MED ORDER — ALBUMIN HUMAN 5 % IV SOLN
250.0000 mL | INTRAVENOUS | Status: AC | PRN
Start: 2020-07-09 — End: 2020-07-10
  Administered 2020-07-09 (×2): 12.5 g via INTRAVENOUS

## 2020-07-09 MED ORDER — ALBUMIN HUMAN 5 % IV SOLN: INTRAVENOUS | Status: DC | PRN

## 2020-07-09 MED ORDER — ACETAMINOPHEN 160 MG/5ML PO SOLN: 650.0000 mg | Freq: Once | ORAL | Status: AC

## 2020-07-09 MED ORDER — HEMOSTATIC AGENTS (NO CHARGE) OPTIME
TOPICAL | Status: DC | PRN
  Administered 2020-07-09 (×3): 1 via TOPICAL

## 2020-07-09 MED ORDER — ROSUVASTATIN CALCIUM 20 MG PO TABS
20.0000 mg | ORAL_TABLET | Freq: Every day | ORAL | Status: DC
  Administered 2020-07-10 – 2020-07-16 (×7): 20 mg via ORAL
  Filled 2020-07-09 (×7): qty 1

## 2020-07-09 MED FILL — Heparin Sodium (Porcine) Inj 1000 Unit/ML: INTRAMUSCULAR | Qty: 30 | Status: AC

## 2020-07-09 MED FILL — Potassium Chloride Inj 2 mEq/ML: INTRAVENOUS | Qty: 40 | Status: AC

## 2020-07-09 MED FILL — Magnesium Sulfate Inj 50%: INTRAMUSCULAR | Qty: 10 | Status: AC

## 2020-07-09 SURGICAL SUPPLY — 102 items
ADAPTER CARDIO PERF ANTE/RETRO (ADAPTER) ×3 IMPLANT
BAG DECANTER FOR FLEXI CONT (MISCELLANEOUS) ×3 IMPLANT
BLADE CLIPPER SURG (BLADE) ×3 IMPLANT
BLADE MINI RND TIP GREEN BEAV (BLADE) ×6 IMPLANT
BLADE STERNUM SYSTEM 6 (BLADE) ×3 IMPLANT
BLADE SURG 11 STRL SS (BLADE) ×3 IMPLANT
BLADE SURG 12 STRL SS (BLADE) ×3 IMPLANT
BNDG ELASTIC 4X5.8 VLCR STR LF (GAUZE/BANDAGES/DRESSINGS) ×3 IMPLANT
BNDG ELASTIC 6X10 VLCR STRL LF (GAUZE/BANDAGES/DRESSINGS) ×3 IMPLANT
BNDG ELASTIC 6X5.8 VLCR STR LF (GAUZE/BANDAGES/DRESSINGS) ×3 IMPLANT
BNDG GAUZE ELAST 4 BULKY (GAUZE/BANDAGES/DRESSINGS) ×3 IMPLANT
CANISTER SUCT 3000ML PPV (MISCELLANEOUS) ×3 IMPLANT
CANNULA GUNDRY RCSP 15FR (MISCELLANEOUS) ×3 IMPLANT
CATH CPB KIT VANTRIGT (MISCELLANEOUS) ×3 IMPLANT
CATH ROBINSON RED A/P 18FR (CATHETERS) ×12 IMPLANT
CATH THORACIC 28FR RT ANG (CATHETERS) ×6 IMPLANT
CLIP RETRACTION 3.0MM CORONARY (MISCELLANEOUS) ×3 IMPLANT
CLIP VESOCCLUDE SM WIDE 24/CT (CLIP) ×9 IMPLANT
CONN 3/8X3/8 GISH STERILE (MISCELLANEOUS) ×3 IMPLANT
DEFOGGER ANTIFOG KIT (MISCELLANEOUS) ×3 IMPLANT
DRAIN CHANNEL 32F RND 10.7 FF (WOUND CARE) ×3 IMPLANT
DRAPE CARDIOVASCULAR INCISE (DRAPES) ×3
DRAPE SLUSH/WARMER DISC (DRAPES) ×3 IMPLANT
DRAPE SRG 135X102X78XABS (DRAPES) ×1 IMPLANT
DRSG AQUACEL AG ADV 3.5X14 (GAUZE/BANDAGES/DRESSINGS) ×3 IMPLANT
ELECT BLADE 4.0 EZ CLEAN MEGAD (MISCELLANEOUS) ×6
ELECT BLADE 6.5 EXT (BLADE) ×3 IMPLANT
ELECT CAUTERY BLADE 6.4 (BLADE) ×3 IMPLANT
ELECT REM PT RETURN 9FT ADLT (ELECTROSURGICAL) ×6
ELECTRODE BLDE 4.0 EZ CLN MEGD (MISCELLANEOUS) ×2 IMPLANT
ELECTRODE REM PT RTRN 9FT ADLT (ELECTROSURGICAL) ×2 IMPLANT
FELT TEFLON 1X6 (MISCELLANEOUS) ×3 IMPLANT
GAUZE SPONGE 4X4 12PLY STRL (GAUZE/BANDAGES/DRESSINGS) ×6 IMPLANT
GAUZE SPONGE 4X4 12PLY STRL LF (GAUZE/BANDAGES/DRESSINGS) ×6 IMPLANT
GLOVE BIO SURGEON STRL SZ 6.5 (GLOVE) ×8 IMPLANT
GLOVE BIO SURGEON STRL SZ7.5 (GLOVE) ×9 IMPLANT
GLOVE BIO SURGEONS STRL SZ 6.5 (GLOVE) ×4
GLOVE BIOGEL PI IND STRL 7.5 (GLOVE) ×1 IMPLANT
GLOVE BIOGEL PI INDICATOR 7.5 (GLOVE) ×2
GLOVE SURG SS PI 6.0 STRL IVOR (GLOVE) ×9 IMPLANT
GLOVE SURG UNDER POLY LF SZ6.5 (GLOVE) ×6 IMPLANT
GOWN STRL REUS W/ TWL LRG LVL3 (GOWN DISPOSABLE) ×8 IMPLANT
GOWN STRL REUS W/TWL LRG LVL3 (GOWN DISPOSABLE) ×24
HEMOSTAT POWDER SURGIFOAM 1G (HEMOSTASIS) ×9 IMPLANT
HEMOSTAT SURGICEL 2X14 (HEMOSTASIS) ×3 IMPLANT
INSERT FOGARTY XLG (MISCELLANEOUS) IMPLANT
KIT BASIN OR (CUSTOM PROCEDURE TRAY) ×3 IMPLANT
KIT SUCTION CATH 14FR (SUCTIONS) ×3 IMPLANT
KIT TURNOVER KIT B (KITS) ×3 IMPLANT
KIT VASOVIEW HEMOPRO 2 VH 4000 (KITS) ×3 IMPLANT
LEAD PACING MYOCARDI (MISCELLANEOUS) ×3 IMPLANT
MARKER GRAFT CORONARY BYPASS (MISCELLANEOUS) ×9 IMPLANT
NS IRRIG 1000ML POUR BTL (IV SOLUTION) ×15 IMPLANT
PACK E OPEN HEART (SUTURE) ×3 IMPLANT
PACK OPEN HEART (CUSTOM PROCEDURE TRAY) ×3 IMPLANT
PAD ARMBOARD 7.5X6 YLW CONV (MISCELLANEOUS) ×6 IMPLANT
PAD ELECT DEFIB RADIOL ZOLL (MISCELLANEOUS) ×3 IMPLANT
PENCIL BUTTON HOLSTER BLD 10FT (ELECTRODE) ×3 IMPLANT
POSITIONER HEAD DONUT 9IN (MISCELLANEOUS) ×3 IMPLANT
PUNCH AORTIC ROTATE 4.0MM (MISCELLANEOUS) IMPLANT
PUNCH AORTIC ROTATE 4.5MM 8IN (MISCELLANEOUS) ×3 IMPLANT
PUNCH AORTIC ROTATE 5MM 8IN (MISCELLANEOUS) IMPLANT
SEALANT PATCH FIBRIN 2X4IN (MISCELLANEOUS) ×3 IMPLANT
SET CARDIOPLEGIA MPS 5001102 (MISCELLANEOUS) ×3 IMPLANT
SPONGE LAP 18X18 RF (DISPOSABLE) ×3 IMPLANT
SPONGE LAP 4X18 RFD (DISPOSABLE) ×6 IMPLANT
SUPPORT HEART JANKE-BARRON (MISCELLANEOUS) ×3 IMPLANT
SURGIFLO W/THROMBIN 8M KIT (HEMOSTASIS) ×6 IMPLANT
SUT BONE WAX W31G (SUTURE) ×6 IMPLANT
SUT MNCRL AB 4-0 PS2 18 (SUTURE) ×3 IMPLANT
SUT PROLENE 3 0 SH DA (SUTURE) IMPLANT
SUT PROLENE 3 0 SH1 36 (SUTURE) IMPLANT
SUT PROLENE 4 0 RB 1 (SUTURE) ×12
SUT PROLENE 4 0 SH DA (SUTURE) ×9 IMPLANT
SUT PROLENE 4-0 RB1 .5 CRCL 36 (SUTURE) ×4 IMPLANT
SUT PROLENE 5 0 C 1 36 (SUTURE) ×3 IMPLANT
SUT PROLENE 6 0 C 1 30 (SUTURE) ×3 IMPLANT
SUT PROLENE 6 0 CC (SUTURE) ×15 IMPLANT
SUT PROLENE 8 0 BV175 6 (SUTURE) ×24 IMPLANT
SUT PROLENE BLUE 7 0 (SUTURE) ×6 IMPLANT
SUT PROLENE POLY MONO (SUTURE) ×12 IMPLANT
SUT SILK  1 MH (SUTURE) ×3
SUT SILK 1 MH (SUTURE) ×1 IMPLANT
SUT SILK 2 0 SH CR/8 (SUTURE) ×9 IMPLANT
SUT SILK 3 0 SH CR/8 (SUTURE) ×3 IMPLANT
SUT STEEL 6MS V (SUTURE) ×6 IMPLANT
SUT STEEL SZ 6 DBL 3X14 BALL (SUTURE) ×3 IMPLANT
SUT VIC AB 1 CTX 36 (SUTURE) ×6
SUT VIC AB 1 CTX36XBRD ANBCTR (SUTURE) ×2 IMPLANT
SUT VIC AB 2-0 CT1 27 (SUTURE) ×3
SUT VIC AB 2-0 CT1 TAPERPNT 27 (SUTURE) ×1 IMPLANT
SUT VIC AB 2-0 CTX 27 (SUTURE) IMPLANT
SUT VIC AB 3-0 X1 27 (SUTURE) IMPLANT
SYSTEM SAHARA CHEST DRAIN ATS (WOUND CARE) ×3 IMPLANT
TAPE CLOTH SURG 4X10 WHT LF (GAUZE/BANDAGES/DRESSINGS) ×3 IMPLANT
TAPE PAPER 2X10 WHT MICROPORE (GAUZE/BANDAGES/DRESSINGS) ×3 IMPLANT
TOWEL GREEN STERILE (TOWEL DISPOSABLE) ×3 IMPLANT
TOWEL GREEN STERILE FF (TOWEL DISPOSABLE) ×3 IMPLANT
TRAY FOLEY SLVR 16FR TEMP STAT (SET/KITS/TRAYS/PACK) ×3 IMPLANT
TUBING LAP HI FLOW INSUFFLATIO (TUBING) ×3 IMPLANT
UNDERPAD 30X36 HEAVY ABSORB (UNDERPADS AND DIAPERS) ×3 IMPLANT
WATER STERILE IRR 1000ML POUR (IV SOLUTION) ×6 IMPLANT

## 2020-07-09 NOTE — Procedures (Signed)
Extubation Procedure Note  Patient Details:   Name: Patrick Miranda DOB: [birth year] MRN: [id number]   Airway Documentation:    Vent end date: (not recorded) Vent end time: (not recorded)   Evaluation  O2 sats: stable throughout Complications: No apparent complications Patient did tolerate procedure well. Bilateral Breath Sounds: Clear,Diminished   Yes  Pt extubated to 4L all parameters met.   Levora Dredge 07/09/2020, 10:09 PM

## 2020-07-09 NOTE — Anesthesia Procedure Notes (Addendum)
Arterial Line Insertion Start/End12/20/2021 6:45 AM, 07/09/2020 7:01 AM Performed by: Lowella Dell, CRNA, CRNA  Patient location: Pre-op. Preanesthetic checklist: patient identified, IV checked, risks and benefits discussed, surgical consent, monitors and equipment checked, pre-op evaluation and timeout performed Lidocaine 1% used for infiltration Left, radial was placed Catheter size: 20 G Hand hygiene performed  and maximum sterile barriers used  Allen's test indicative of satisfactory collateral circulation Attempts: 1 Procedure performed without using ultrasound guided technique. Ultrasound Notes:anatomy identified Following insertion, Biopatch and dressing applied. Post procedure assessment: normal  Patient tolerated the procedure well with no immediate complications. Additional procedure comments: Performed by Brayton El, SRNA.

## 2020-07-09 NOTE — Anesthesia Procedure Notes (Signed)
Central Venous Catheter Insertion Performed by: Effie Berkshire, MD, anesthesiologist Start/End12/20/2021 7:20 AM, 07/09/2020 7:25 AM Patient location: Pre-op. Preanesthetic checklist: patient identified, IV checked, site marked, risks and benefits discussed, surgical consent, monitors and equipment checked, pre-op evaluation, timeout performed and anesthesia consent Hand hygiene performed  and maximum sterile barriers used  PA cath was placed.Swan type:thermodilution Procedure performed without using ultrasound guided technique. Attempts: 1 Patient tolerated the procedure well with no immediate complications.

## 2020-07-09 NOTE — Brief Op Note (Signed)
07/04/2020 - 07/09/2020  10:36 AM  PATIENT:  Patrick Miranda  74 y.o. male  PRE-OPERATIVE DIAGNOSIS:  Coronary Artery Disease  POST-OPERATIVE DIAGNOSIS:  Coronary Artery Disease  PROCEDURE:  Procedure(s): CORONARY ARTERY BYPASS GRAFTING (CABG) TIMES THREE , USING LEFT INTERNAL MAMMARY ARTERY AND RIGHT LEG GREATER SAPHENOUS VEIN HARVESTED ENDOSCOPICALLY (N/A) TRANSESOPHAGEAL ECHOCARDIOGRAM (TEE) (N/A)  SURGEON:  Surgeon(s) and Role:    Ivin Poot, MD - Primary  PHYSICIAN ASSISTANT:   Nicholes Rough, PA-C   ANESTHESIA:   general  EBL:  975 mL   BLOOD ADMINISTERED:none  DRAINS: ROUTINE   LOCAL MEDICATIONS USED:  NONE  SPECIMEN:  No Specimen  DISPOSITION OF SPECIMEN:  n/a  COUNTS:  YES  DICTATION: .Dragon Dictation  PLAN OF CARE: Admit to inpatient   PATIENT DISPOSITION:  ICU - intubated and hemodynamically stable.   Delay start of Pharmacological VTE agent (>24hrs) due to surgical blood loss or risk of bleeding: yes

## 2020-07-09 NOTE — Anesthesia Postprocedure Evaluation (Signed)
Anesthesia Post Note  Patient: [place]  Procedure(s) Performed: CORONARY ARTERY BYPASS GRAFTING (CABG) TIMES THREE , USING LEFT INTERNAL MAMMARY ARTERY AND RIGHT LEG GREATER SAPHENOUS VEIN HARVESTED ENDOSCOPICALLY (N/A Chest) TRANSESOPHAGEAL ECHOCARDIOGRAM (TEE) (N/A Chest)     Patient location during evaluation: SICU Anesthesia Type: General Level of consciousness: sedated Pain management: pain level controlled Vital Signs Assessment: post-procedure vital signs reviewed and stable Respiratory status: patient remains intubated per anesthesia plan Cardiovascular status: stable Postop Assessment: no apparent nausea or vomiting Anesthetic complications: no   No complications documented.  Last Vitals:  Vitals:   07/09/20 0722 07/09/20 1454  BP:    Pulse: (!) 47   Resp: 11 12  Temp:    SpO2: 99%     Last Pain:  Vitals:   07/09/20 0505  TempSrc: Oral  PainSc: 0-No pain                 Shaul Trautman,W. EDMOND

## 2020-07-09 NOTE — Anesthesia Procedure Notes (Signed)
Central Venous Catheter Insertion Performed by: Effie Berkshire, MD, anesthesiologist Start/End12/20/2021 7:15 AM, 07/09/2020 7:20 AM Patient location: Pre-op. Preanesthetic checklist: patient identified, IV checked, site marked, risks and benefits discussed, surgical consent, monitors and equipment checked, pre-op evaluation, timeout performed and anesthesia consent Position: Trendelenburg Lidocaine 1% used for infiltration and patient sedated Hand hygiene performed , maximum sterile barriers used  and Seldinger technique used Catheter size: 8.5 Fr Total catheter length 10. Central line was placed.Sheath introducer Swan type:thermodilution PA Cath depth:50 Procedure performed using ultrasound guided technique. Ultrasound Notes:anatomy identified, needle tip was noted to be adjacent to the nerve/plexus identified, no ultrasound evidence of intravascular and/or intraneural injection and image(s) printed for medical record Attempts: 1 Following insertion, line sutured and dressing applied. Post procedure assessment: blood return through all ports, free fluid flow and no air  Patient tolerated the procedure well with no immediate complications.

## 2020-07-09 NOTE — Transfer of Care (Signed)
Immediate Anesthesia Transfer of Care Note  Patient: Patrick Miranda  Procedure(s) Performed: CORONARY ARTERY BYPASS GRAFTING (CABG) TIMES THREE , USING LEFT INTERNAL MAMMARY ARTERY AND RIGHT LEG GREATER SAPHENOUS VEIN HARVESTED ENDOSCOPICALLY (N/A Chest) TRANSESOPHAGEAL ECHOCARDIOGRAM (TEE) (N/A Chest)  Patient Location: ICU  Anesthesia Type:General  Level of Consciousness: sedated and Patient remains intubated per anesthesia plan  Airway & Oxygen Therapy: Patient remains intubated per anesthesia plan and Patient placed on Ventilator (see vital sign flow sheet for setting)  Post-op Assessment: Report given to RN and Post -op Vital signs reviewed and stable  Post vital signs: Reviewed and stable  Last Vitals:  Vitals Value Taken Time  BP    Temp    Pulse    Resp    SpO2      Last Pain:  Vitals:   07/09/20 0505  TempSrc: Oral  PainSc: 0-No pain         Complications: No complications documented.

## 2020-07-09 NOTE — Progress Notes (Signed)
°  Echocardiogram Echocardiogram Transesophageal has been performed.  Fidel Levy 07/09/2020, 8:20 AM

## 2020-07-09 NOTE — Progress Notes (Signed)
Pre Procedure note for inpatients:   [place] has been scheduled for Procedure(s): CORONARY ARTERY BYPASS GRAFTING (CABG) (N/A) TRANSESOPHAGEAL ECHOCARDIOGRAM (TEE) (N/A) today. The various methods of treatment have been discussed with the patient. After consideration of the risks, benefits and treatment options the patient has consented to the planned procedure.   The patient has been seen and labs reviewed. There are no changes in the patient's condition to prevent proceeding with the planned procedure today.  Recent labs:  Lab Results  Component Value Date   WBC 5.2 07/09/2020   HGB 11.8 (L) 07/09/2020   HCT 37.2 (L) 07/09/2020   PLT 293 07/09/2020   GLUCOSE 169 (H) 07/09/2020   CHOL 122 07/06/2020   TRIG 133 07/06/2020   HDL 27 (L) 07/06/2020   LDLCALC 68 07/06/2020   ALT 10 07/05/2020   AST 18 07/05/2020   NA 138 07/09/2020   K 4.0 07/09/2020   CL 106 07/09/2020   CREATININE 1.36 (H) 07/09/2020   BUN 25 (H) 07/09/2020   CO2 24 07/09/2020   TSH 2.525 07/05/2020   INR 1.1 07/05/2020   HGBA1C 8.0 (H) 07/05/2020    Len Childs, MD 07/09/2020 7:28 AM

## 2020-07-09 NOTE — Anesthesia Procedure Notes (Addendum)
Procedure Name: Intubation Date/Time: 07/09/2020 7:57 AM Performed by: Lowella Dell, CRNA Pre-anesthesia Checklist: Patient identified, Emergency Drugs available, Suction available and Patient being monitored Patient Re-evaluated:Patient Re-evaluated prior to induction Oxygen Delivery Method: Circle System Utilized Preoxygenation: Pre-oxygenation with 100% oxygen Induction Type: IV induction Ventilation: Mask ventilation without difficulty Laryngoscope Size: Mac and 3 Grade View: Grade I Tube type: Oral Tube size: 8.0 mm Number of attempts: 1 Airway Equipment and Method: Stylet and Oral airway Placement Confirmation: ETT inserted through vocal cords under direct vision,  positive ETCO2 and breath sounds checked- equal and bilateral Secured at: 22 cm Tube secured with: Tape Dental Injury: Teeth and Oropharynx as per pre-operative assessment  Comments: Airway by Brayton El SRNA

## 2020-07-09 NOTE — Progress Notes (Signed)
EVENING ROUNDS NOTE :     [address]       [place] [address]             [phone]                 Day of Surgery Procedure(s) (LRB): CORONARY ARTERY BYPASS GRAFTING (CABG) TIMES THREE , USING LEFT INTERNAL MAMMARY ARTERY AND RIGHT LEG GREATER SAPHENOUS VEIN HARVESTED ENDOSCOPICALLY (N/A) TRANSESOPHAGEAL ECHOCARDIOGRAM (TEE) (N/A)   Total Length of Stay:  LOS: 5 days  Events:   Low chest tube output On milr 0.25 Off levo Weaning to extubate    BP 134/87 (BP Location: Right Arm)   Pulse (!) 47   Temp 97.7 F (36.5 C) (Oral)   Resp 12   Ht 5\' 11"  (1.803 m)   Wt 78.2 kg   SpO2 99%   BMI 24.04 kg/m   PAP: (32-39)/(20-31) 39/24  Vent Mode: PRVC FiO2 (%):  [50 %] 50 % Set Rate:  [12 bmp] 12 bmp Vt Set:  [600 mL] 600 mL PEEP:  [5 cmH20] 5 cmH20 Plateau Pressure:  [19 cmH20] 19 cmH20  . sodium chloride    . [START ON 07/10/2020] sodium chloride    . sodium chloride 10 mL/hr (07/09/20 1528)  . albumin human 12.5 g (07/09/20 1529)  . cefUROXime (ZINACEF)  IV    . dexmedetomidine (PRECEDEX) IV infusion    . famotidine (PEPCID) IV    . insulin    . lactated ringers    . lactated ringers    . magnesium sulfate 4 g (07/09/20 1526)  . milrinone    . nitroGLYCERIN    . phenylephrine (NEO-SYNEPHRINE) Adult infusion    . potassium chloride 10 mEq (07/09/20 1523)  . vancomycin      I/O last 3 completed shifts: In: 720 [P.O.:720] Out: 1700 [Urine:1700]   CBC Latest Ref Rng & Units 07/09/2020 07/09/2020 07/09/2020  WBC 4.0 - 10.5 K/uL - - -  Hemoglobin 13.0 - 17.0 g/dL 9.2(L) 9.2(L) 8.8(L)  Hematocrit 39.0 - 52.0 % 27.0(L) 27.0(L) 26.0(L)  Platelets 150 - 400 K/uL - - -    BMP Latest Ref Rng & Units 07/09/2020 07/09/2020 07/09/2020  Glucose 70 - 99 mg/dL - 137(H) 140(H)  BUN 8 - 23 mg/dL - 21 21  Creatinine 0.61 - 1.24 mg/dL - 1.00 0.80  Sodium 135 - 145 mmol/L 139 138 137  Potassium 3.5 - 5.1 mmol/L 4.0 4.0 4.5  Chloride 98 - 111 mmol/L  - 102 103  CO2 22 - 32 mmol/L - - -  Calcium 8.9 - 10.3 mg/dL - - -    ABG    Component Value Date/Time   PHART 7.366 07/09/2020 1325   PCO2ART 41.6 07/09/2020 1325   PO2ART 150 (H) 07/09/2020 1325   HCO3 23.8 07/09/2020 1325   TCO2 25 07/09/2020 1325   ACIDBASEDEF 1.0 07/09/2020 1325   O2SAT 99.0 07/09/2020 [place], MD 07/09/2020 3:30 PM

## 2020-07-10 ENCOUNTER — Inpatient Hospital Stay (HOSPITAL_COMMUNITY): Payer: Medicare Other

## 2020-07-10 ENCOUNTER — Encounter (HOSPITAL_COMMUNITY): Payer: Self-pay | Admitting: Cardiothoracic Surgery

## 2020-07-10 DIAGNOSIS — Z951 Presence of aortocoronary bypass graft: Secondary | ICD-10-CM

## 2020-07-10 LAB — BASIC METABOLIC PANEL
Anion gap: 9 (ref 5–15)
Anion gap: 10 (ref 5–15)
BUN: 20 mg/dL (ref 8–23)
BUN: 20 mg/dL (ref 8–23)
CO2: 20 mmol/L — ABNORMAL LOW (ref 22–32)
CO2: 21 mmol/L — ABNORMAL LOW (ref 22–32)
Calcium: 7.6 mg/dL — ABNORMAL LOW (ref 8.9–10.3)
Calcium: 7.7 mg/dL — ABNORMAL LOW (ref 8.9–10.3)
Chloride: 109 mmol/L (ref 98–111)
Chloride: 104 mmol/L (ref 98–111)
Creatinine, Ser: 1.39 mg/dL — ABNORMAL HIGH (ref 0.61–1.24)
Creatinine, Ser: 1.41 mg/dL — ABNORMAL HIGH (ref 0.61–1.24)
GFR, Estimated: 53 mL/min — ABNORMAL LOW (ref >60)
GFR, Estimated: 52 mL/min — ABNORMAL LOW (ref >60)
Glucose, Bld: 169 mg/dL — ABNORMAL HIGH (ref 70–99)
Glucose, Bld: 209 mg/dL — ABNORMAL HIGH (ref 70–99)
Potassium: 4.1 mmol/L (ref 3.5–5.1)
Potassium: 3.7 mmol/L (ref 3.5–5.1)
Sodium: 138 mmol/L (ref 135–145)
Sodium: 135 mmol/L (ref 135–145)

## 2020-07-10 LAB — PREPARE FRESH FROZEN PLASMA
Unit division: 0
Unit division: 0

## 2020-07-10 LAB — BPAM FFP
Blood Product Expiration Date: 202112252359
Blood Product Expiration Date: 202112252359
ISSUE DATE / TIME: 202112201235
ISSUE DATE / TIME: 202112201235
Unit Type and Rh: 5100
Unit Type and Rh: 5100

## 2020-07-10 LAB — CBC
HCT: 24.3 % — ABNORMAL LOW (ref 39.0–52.0)
HCT: 27.5 % — ABNORMAL LOW (ref 39.0–52.0)
Hemoglobin: 7.9 g/dL — ABNORMAL LOW (ref 13.0–17.0)
Hemoglobin: 8.9 g/dL — ABNORMAL LOW (ref 13.0–17.0)
MCH: 29.3 pg (ref 26.0–34.0)
MCH: 29 pg (ref 26.0–34.0)
MCHC: 32.5 g/dL (ref 30.0–36.0)
MCHC: 32.4 g/dL (ref 30.0–36.0)
MCV: 90 fL (ref 80.0–100.0)
MCV: 89.6 fL (ref 80.0–100.0)
Platelets: 167 10*3/uL (ref 150–400)
Platelets: 160 10*3/uL (ref 150–400)
RBC: 2.7 MIL/uL — ABNORMAL LOW (ref 4.22–5.81)
RBC: 3.07 MIL/uL — ABNORMAL LOW (ref 4.22–5.81)
RDW: 14.8 % (ref 11.5–15.5)
RDW: 15.1 % (ref 11.5–15.5)
WBC: 8 10*3/uL (ref 4.0–10.5)
WBC: 9.5 10*3/uL (ref 4.0–10.5)
nRBC: 0 % (ref 0.0–0.2)
nRBC: 0 % (ref 0.0–0.2)

## 2020-07-10 LAB — MAGNESIUM
Magnesium: 2.4 mg/dL (ref 1.7–2.4)
Magnesium: 2.3 mg/dL (ref 1.7–2.4)

## 2020-07-10 LAB — GLUCOSE, CAPILLARY
Glucose-Capillary: 154 mg/dL — ABNORMAL HIGH (ref 70–99)
Glucose-Capillary: 166 mg/dL — ABNORMAL HIGH (ref 70–99)
Glucose-Capillary: 167 mg/dL — ABNORMAL HIGH (ref 70–99)
Glucose-Capillary: 168 mg/dL — ABNORMAL HIGH (ref 70–99)
Glucose-Capillary: 173 mg/dL — ABNORMAL HIGH (ref 70–99)
Glucose-Capillary: 176 mg/dL — ABNORMAL HIGH (ref 70–99)
Glucose-Capillary: 181 mg/dL — ABNORMAL HIGH (ref 70–99)
Glucose-Capillary: 188 mg/dL — ABNORMAL HIGH (ref 70–99)
Glucose-Capillary: 198 mg/dL — ABNORMAL HIGH (ref 70–99)
Glucose-Capillary: 215 mg/dL — ABNORMAL HIGH (ref 70–99)
Glucose-Capillary: 257 mg/dL — ABNORMAL HIGH (ref 70–99)
Glucose-Capillary: 276 mg/dL — ABNORMAL HIGH (ref 70–99)

## 2020-07-10 LAB — PREPARE RBC (CROSSMATCH)

## 2020-07-10 MED ORDER — FUROSEMIDE 10 MG/ML IJ SOLN
20.0000 mg | Freq: Once | INTRAMUSCULAR | Status: AC
  Administered 2020-07-10: 20 mg via INTRAVENOUS
  Filled 2020-07-10: qty 2

## 2020-07-10 MED ORDER — INSULIN DETEMIR 100 UNIT/ML ~~LOC~~ SOLN
8.0000 [IU] | Freq: Two times a day (BID) | SUBCUTANEOUS | Status: DC
  Administered 2020-07-10 (×2): 8 [IU] via SUBCUTANEOUS
  Filled 2020-07-10 (×4): qty 0.08

## 2020-07-10 MED ORDER — SODIUM BICARBONATE 8.4 % IV SOLN
25.0000 meq | Freq: Once | INTRAVENOUS | Status: AC
  Administered 2020-07-10: 25 meq via INTRAVENOUS
  Filled 2020-07-10: qty 50

## 2020-07-10 MED ORDER — AMIODARONE HCL IN DEXTROSE 360-4.14 MG/200ML-% IV SOLN
60.0000 mg/h | INTRAVENOUS | Status: DC
  Administered 2020-07-10 (×2): 60 mg/h via INTRAVENOUS
  Filled 2020-07-10: qty 200

## 2020-07-10 MED ORDER — AMIODARONE HCL IN DEXTROSE 360-4.14 MG/200ML-% IV SOLN
30.0000 mg/h | INTRAVENOUS | Status: DC
  Administered 2020-07-11 – 2020-07-13 (×5): 30 mg/h via INTRAVENOUS
  Filled 2020-07-10 (×5): qty 200

## 2020-07-10 MED ORDER — AMIODARONE HCL IN DEXTROSE 360-4.14 MG/200ML-% IV SOLN
INTRAVENOUS | Status: AC
  Administered 2020-07-10: 150 mg/h
  Filled 2020-07-10: qty 200

## 2020-07-10 MED ORDER — SODIUM CHLORIDE 0.9% FLUSH
10.0000 mL | Freq: Two times a day (BID) | INTRAVENOUS | Status: DC
  Administered 2020-07-10 – 2020-07-11 (×4): 10 mL

## 2020-07-10 MED ORDER — ORAL CARE MOUTH RINSE
15.0000 mL | Freq: Two times a day (BID) | OROMUCOSAL | Status: DC
  Administered 2020-07-10 – 2020-07-16 (×11): 15 mL via OROMUCOSAL

## 2020-07-10 MED ORDER — SODIUM CHLORIDE 0.9% FLUSH
10.0000 mL | INTRAVENOUS | Status: DC | PRN
  Administered 2020-07-13: 10 mL

## 2020-07-10 MED ORDER — INSULIN ASPART 100 UNIT/ML ~~LOC~~ SOLN
0.0000 [IU] | SUBCUTANEOUS | Status: DC
  Administered 2020-07-10: 4 [IU] via SUBCUTANEOUS
  Administered 2020-07-10: 12 [IU] via SUBCUTANEOUS
  Administered 2020-07-10 – 2020-07-11 (×2): 8 [IU] via SUBCUTANEOUS
  Administered 2020-07-11: 12 [IU] via SUBCUTANEOUS
  Administered 2020-07-11: 4 [IU] via SUBCUTANEOUS
  Administered 2020-07-11: 16 [IU] via SUBCUTANEOUS
  Administered 2020-07-11: 12 [IU] via SUBCUTANEOUS
  Administered 2020-07-11 (×2): 8 [IU] via SUBCUTANEOUS
  Administered 2020-07-12 (×2): 4 [IU] via SUBCUTANEOUS

## 2020-07-10 MED ORDER — AMIODARONE IV BOLUS ONLY 150 MG/100ML
150.0000 mg | Freq: Once | INTRAVENOUS | Status: AC
  Administered 2020-07-10: 150 mg via INTRAVENOUS

## 2020-07-10 MED ORDER — FUROSEMIDE 10 MG/ML IJ SOLN
20.0000 mg | Freq: Two times a day (BID) | INTRAMUSCULAR | Status: DC
  Administered 2020-07-10 – 2020-07-13 (×6): 20 mg via INTRAVENOUS
  Filled 2020-07-10 (×6): qty 2

## 2020-07-10 MED ORDER — POTASSIUM CHLORIDE CRYS ER 20 MEQ PO TBCR
20.0000 meq | EXTENDED_RELEASE_TABLET | ORAL | Status: AC
  Administered 2020-07-10 – 2020-07-11 (×3): 20 meq via ORAL
  Filled 2020-07-10 (×3): qty 1

## 2020-07-10 MED ORDER — FE FUMARATE-B12-VIT C-FA-IFC PO CAPS
1.0000 | ORAL_CAPSULE | Freq: Two times a day (BID) | ORAL | Status: DC
  Administered 2020-07-10 – 2020-07-16 (×13): 1 via ORAL
  Filled 2020-07-10 (×14): qty 1

## 2020-07-10 NOTE — Progress Notes (Signed)
EKG CRITICAL VALUE     12 lead EKG performed.  Critical value noted.  Council, Gwyndolyn Saxon, RN notified.   Seirra Kos 07/10/2020 7:33 AM

## 2020-07-10 NOTE — Progress Notes (Addendum)
[facility] DAILY ICU PROGRESS NOTE                   [address]            [place] [address]          [phone]   1 Day Post-Op Procedure(s) (LRB): CORONARY ARTERY BYPASS GRAFTING (CABG) TIMES THREE , USING LEFT INTERNAL MAMMARY ARTERY AND RIGHT LEG GREATER SAPHENOUS VEIN HARVESTED ENDOSCOPICALLY (N/A) TRANSESOPHAGEAL ECHOCARDIOGRAM (TEE) (N/A)  Total Length of Stay:  LOS: 6 days   Subjective: Feels good this morning. No complaints.   Objective: Vital signs in last 24 hours: Temp:  [96.62 F (35.9 C)-100.22 F (37.9 C)] 99.86 F (37.7 C) (12/21 0700) Pulse Rate:  [83-95] 87 (12/21 0700) Cardiac Rhythm: Normal sinus rhythm (12/20 2100) Resp:  [12-23] 16 (12/21 0700) BP: (86-123)/(45-89) 108/54 (12/21 0700) SpO2:  [91 %-100 %] 97 % (12/21 0700) Arterial Line BP: (98-156)/(43-68) 112/43 (12/21 0700) FiO2 (%):  [40 %-50 %] 40 % (12/20 2125) Weight:  [83.8 kg] 83.8 kg (12/21 0500)  Filed Weights   07/08/20 0500 07/09/20 0505 07/10/20 0500  Weight: 78.7 kg 78.2 kg 83.8 kg    Weight change: 5.6 kg   Hemodynamic parameters for last 24 hours: PAP: (13-25)/(3-17) 18/10 CO:  [3.9 L/min-5.5 L/min] 3.9 L/min CI:  [2 L/min/m2-2.8 L/min/m2] 2 L/min/m2  Intake/Output from previous day: 12/20 0701 - 12/21 0700 In: 5155.1 [I.V.:2931.9; Blood:1085; IV Piggyback:1138.2] Out: [id number] [Urine:3010; Blood:975; Chest Tube:720]  Intake/Output this shift: No intake/output data recorded.  Current Meds: Scheduled Meds: . acetaminophen  1,000 mg Oral Q6H   Or  . acetaminophen (TYLENOL) oral liquid 160 mg/5 mL  1,000 mg Per Tube Q6H  . aspirin EC  325 mg Oral Daily   Or  . aspirin  324 mg Per Tube Daily  . bisacodyl  10 mg Oral Daily   Or  . bisacodyl  10 mg Rectal Daily  . Chlorhexidine Gluconate Cloth  6 each Topical Daily  . docusate sodium  200 mg Oral Daily  . metoCLOPramide (REGLAN) injection  10 mg Intravenous Q6H  . metoprolol tartrate  12.5 mg Oral BID   Or   . metoprolol tartrate  12.5 mg Per Tube BID  . [START ON 07/11/2020] pantoprazole  40 mg Oral Daily  . rosuvastatin  20 mg Oral Daily  . sodium chloride flush  3 mL Intravenous Q12H   Continuous Infusions: . sodium chloride 10 mL/hr at 07/10/20 0700  . sodium chloride    . sodium chloride 10 mL/hr (07/09/20 1528)  . albumin human 12.5 g (07/09/20 1849)  . cefUROXime (ZINACEF)  IV 1.5 g (07/10/20 0810)  . dexmedetomidine (PRECEDEX) IV infusion Stopped (07/09/20 1712)  . insulin 1.6 mL/hr at 07/10/20 0700  . lactated ringers Stopped (07/09/20 1532)  . lactated ringers 20 mL/hr at 07/10/20 0700  . milrinone 0.25 mcg/kg/min (07/10/20 0700)  . nitroGLYCERIN Stopped (07/10/20 0108)  . phenylephrine (NEO-SYNEPHRINE) Adult infusion Stopped (07/09/20 1535)   PRN Meds:.sodium chloride, albumin human, dextrose, fentaNYL (SUBLIMAZE) injection, metoprolol tartrate, ondansetron (ZOFRAN) IV, oxyCODONE, sodium chloride flush, traMADol  General appearance: alert, cooperative and no distress Heart: regular rate and rhythm, S1, S2 normal, no murmur, click, rub or gallop Lungs: clear to auscultation bilaterally, diminished in the lower lobes Abdomen: soft, non-tender; bowel sounds normal; no masses,  no organomegaly Extremities: extremities normal, atraumatic, no cyanosis or edema Wound: clean and dry covered with a sterile dressing  Lab Results:  CBC: Recent Labs    07/09/20 2155 07/09/20 2300 07/10/20 0333  WBC 6.8  --  8.0  HGB 8.3* 7.8* 7.9*  HCT 25.9* 23.0* 24.3*  PLT 176  --  167   BMET:  Recent Labs    07/09/20 2155 07/09/20 2300 07/10/20 0333  NA 137 139 138  K 4.3 4.1 4.1  CL 109  --  109  CO2 19*  --  20*  GLUCOSE 159*  --  169*  BUN 20  --  20  CREATININE 1.20  --  1.39*  CALCIUM 7.6*  --  7.6*    CMET: Lab Results  Component Value Date   WBC 8.0 07/10/2020   HGB 7.9 (L) 07/10/2020   HCT 24.3 (L) 07/10/2020   PLT 167 07/10/2020   GLUCOSE 169 (H) 07/10/2020    CHOL 122 07/06/2020   TRIG 133 07/06/2020   HDL 27 (L) 07/06/2020   LDLCALC 68 07/06/2020   ALT 10 07/05/2020   AST 18 07/05/2020   NA 138 07/10/2020   K 4.1 07/10/2020   CL 109 07/10/2020   CREATININE 1.39 (H) 07/10/2020   BUN 20 07/10/2020   CO2 20 (L) 07/10/2020   TSH 2.525 07/05/2020   INR 1.3 (H) 07/09/2020   HGBA1C 8.0 (H) 07/05/2020      PT/INR:  Recent Labs    07/09/20 1446  LABPROT 16.1*  INR 1.3*   Radiology: DG Chest 1 View  Result Date: 07/09/2020 CLINICAL DATA:  Open heart surgery EXAM: CHEST  1 VIEW COMPARISON:  07/05/2020 FINDINGS: Postoperative changes to the chest with median sternotomy wires. Endotracheal tube terminates 4.2 cm above the carina. Right IJ Swan-Ganz catheter terminates at the level of the main pulmonary outflow tract. Bilateral chest tubes and mediastinal drain in place. Heart size is mildly enlarged. Streaky opacities within the bilateral mid lung zones and left lung base favor atelectasis. No pleural effusion or pneumothorax. IMPRESSION: Postoperative changes to the chest with bilateral atelectasis. Support apparatus, as above. Electronically Signed   By: Davina Poke D.O.   On: 07/09/2020 14:51   DG Chest Port 1 View  Result Date: 07/10/2020 CLINICAL DATA:  Chest tube.  CABG. EXAM: PORTABLE CHEST 1 VIEW COMPARISON:  07/09/2020. FINDINGS: Interim extubation. Swan-Ganz catheter, mediastinal drainage catheter, and bilateral chest tubes in stable position. Prior CABG. Cardiomegaly. No pulmonary venous congestion. Low lung volumes with bibasilar atelectasis. No prominent pleural effusion. No pneumothorax. IMPRESSION: 1. Interim extubation. Swan-Ganz catheter, mediastinal drainage catheter, bilateral chest tubes in stable position. No pneumothorax. 2. Prior CABG. Stable cardiomegaly. 3. Low lung volumes with mild bibasilar atelectasis. Chest is stable from prior exam. Electronically Signed   By: Marcello Moores  Treto   On: 07/10/2020 07:15      Assessment/Plan: S/P Procedure(s) (LRB): CORONARY ARTERY BYPASS GRAFTING (CABG) TIMES THREE , USING LEFT INTERNAL MAMMARY ARTERY AND RIGHT LEG GREATER SAPHENOUS VEIN HARVESTED ENDOSCOPICALLY (N/A) TRANSESOPHAGEAL ECHOCARDIOGRAM (TEE) (N/A)  1. CV-NSR in the 80s, BP well controlled. Remains on low-dose milrinone. Continue asa, statin, and metoprolol. 2. Pulm-CXR: Low lung volumes with mild bibasilar atelectasis. Chest is stable from prior exam. Chest tubes put out > 700cc/since surgery. Keep.  3. Renal- creatinine 1.39, electrolytes okay 4. H and H 7.9/24.3, will add Trinsicon. Expected acute blood loss anemia.  5. Endo-blood glucose has been well controlled.   Plan: Feels good this morning. Pain is well controlled. See progression orders. OOB to chair. Remove arterial line and swan ganz catheter. Keep chest tubes another day.  Encouraged use of incentive spirometer. Doing well POD 1.   Elgie Collard 07/10/2020 8:17 AM   Neuro intact, nsr off pressors Will transfuse for expected surgical blood loss anemia with underlying stage 3 CKD Follow postop creatinine  patient examined and medical record reviewed,agree with above note. Tharon Aquas Trigt III 07/10/2020

## 2020-07-10 NOTE — Plan of Care (Signed)
  Problem: Cardiac: Goal: Will achieve and/or maintain hemodynamic stability Outcome: Progressing   Problem: Clinical Measurements: Goal: Postoperative complications will be avoided or minimized Outcome: Progressing   Problem: Respiratory: Goal: Respiratory status will improve Outcome: Progressing   Problem: Urinary Elimination: Goal: Ability to achieve and maintain adequate renal perfusion and functioning will improve Outcome: Progressing   

## 2020-07-10 NOTE — Progress Notes (Signed)
EVENING ROUNDS NOTE :     [address]       [place] [address]             [phone]                 1 Day Post-Op Procedure(s) (LRB): CORONARY ARTERY BYPASS GRAFTING (CABG) TIMES THREE , USING LEFT INTERNAL MAMMARY ARTERY AND RIGHT LEG GREATER SAPHENOUS VEIN HARVESTED ENDOSCOPICALLY (N/A) TRANSESOPHAGEAL ECHOCARDIOGRAM (TEE) (N/A)  Total Length of Stay:  LOS: 6 days  BP 124/64   Pulse 79   Temp 99 F (37.2 C) (Oral)   Resp 17   Ht 5\' 11"  (1.803 m)   Wt 83.8 kg   SpO2 95%   BMI 25.77 kg/m   .Intake/Output      12/20 0701 12/21 0700 12/21 0701 12/22 0700   P.O.     I.V. (mL/kg) 2931.9 (35) 216.5 (2.6)   Blood 1085 315   IV Piggyback 1138.2 100   Total Intake(mL/kg) 5155.1 (61.5) 631.5 (7.5)   Urine (mL/kg/hr) 3010 (1.5) 490 (0.7)   Blood 975    Chest Tube 720 240   Total Output 4705 730   Net +450.1 -98.5          . sodium chloride Stopped (07/10/20 1054)  . sodium chloride    . sodium chloride 10 mL/hr (07/09/20 1528)  . cefUROXime (ZINACEF)  IV Stopped (07/10/20 0840)  . dexmedetomidine (PRECEDEX) IV infusion Stopped (07/09/20 1712)  . insulin Stopped (07/10/20 1335)  . lactated ringers Stopped (07/09/20 1532)  . lactated ringers 20 mL/hr at 07/10/20 1400  . milrinone 0.125 mcg/kg/min (07/10/20 1400)  . nitroGLYCERIN Stopped (07/10/20 0108)  . phenylephrine (NEO-SYNEPHRINE) Adult infusion Stopped (07/09/20 1535)     Lab Results  Component Value Date   WBC 8.0 07/10/2020   HGB 7.9 (L) 07/10/2020   HCT 24.3 (L) 07/10/2020   PLT 167 07/10/2020   GLUCOSE 169 (H) 07/10/2020   CHOL 122 07/06/2020   TRIG 133 07/06/2020   HDL 27 (L) 07/06/2020   LDLCALC 68 07/06/2020   ALT 10 07/05/2020   AST 18 07/05/2020   NA 138 07/10/2020   K 4.1 07/10/2020   CL 109 07/10/2020   CREATININE 1.39 (H) 07/10/2020   BUN 20 07/10/2020   CO2 20 (L) 07/10/2020   TSH 2.525 07/05/2020   INR 1.3 (H) 07/09/2020   HGBA1C 8.0 (H) 07/05/2020   Wants  home bipap prbc ordered per PVT today S/g out Foley and chest tubes out in am   Patrick Isaac MD  Beeper [phone] Office [phone] 07/10/2020 3:52 PM

## 2020-07-10 NOTE — Progress Notes (Signed)
Pt placed self on home cpap unit and is resting comfortably at this time.

## 2020-07-10 NOTE — Op Note (Signed)
NAMERAYNOLD, MANGAS MEDICAL RECORD [id number] ACCOUNT [id number] DATE OF BIRTH:1945-09-17 FACILITY: [facility] LOCATION: [facility] PHYSICIAN:Aubryana Vittorio VAN TRIGT III, MD  OPERATIVE REPORT  DATE OF PROCEDURE:  07/09/2020  OPERATION: 1.  Coronary artery bypass grafting x3 (left internal mammary artery to left anterior descending, saphenous vein graft to diagonal, saphenous vein graft to RCA). 2.  Endoscopic harvest of right leg greater saphenous vein.  SURGEON:  Ivin Poot, MD  ASSISTANT:  Shaaron Adler, PA-C.  ANESTHESIA:  General by Dr. Suann Larry.  PREOPERATIVE DIAGNOSES:  Severe 2-vessel coronary artery disease in a diabetic pattern with history of DMI and recently admitted for class III heart failure.  POSTOPERATIVE DIAGNOSES:  Severe 2-vessel coronary artery disease in a diabetic pattern with history of DMI and recently admitted for class III heart failure.  CLINICAL NOTE:  The patient is a 74 year old sedentary diabetic who recently recovered from COVID-19 pneumonitis, for which he was hospitalized 10 days and treated for pneumonia and a bilateral lower lobe pulmonary emboli.  He recovered and was regaining  his energy and exercise tolerance when he developed progressive increasing shortness of breath with some chest tightness.  He was checked for COVID, which was negative.  He had a stress test, which was positive for ischemia and subsequent cardiac  catheterization by Dr. Claiborne Billings demonstrated diffuse 2-vessel coronary artery disease of the LAD and RCA distributions.  He was recommended for coronary bypass surgery.  I saw the patient in consultation after his cardiac catheterization and reviewed the images of his cardiac cath and echocardiogram.  He had been on Plavix for several years for previous stroke.  I agreed that he would benefit from coronary bypass  grafting as his best long-term therapy, although at increased risk due to his severe diabetic coronary disease and  suboptimal targets in some LV function decrease.  I discussed the procedure of coronary bypass grafting in detail with the patient and his  wife including the use of general anesthesia and cardiopulmonary bypass, the location of the surgical incisions, and the expected postoperative hospital recovery.  I discussed with the patient and wife the risks to him of the operation including risk of  stroke, bleeding, MI, blood transfusion requirement, organ failure, infection, and death.  After reviewing these issues, he demonstrated his understanding and agreed to proceed with surgery under what I felt was an informed consent.  OPERATIVE FINDINGS: 1.  Suboptimal coronary targets as expected from the coronary angiograms,especially the LAD which was small with difuse disease, but successful grafting of the LAD, RCA and diagonal branch of LAD. 2.  Preserved left ventricular function by echo at the termination of cardiopulmonary bypass. 3.  No evidence of pulmonary active disease from his recent COVID infection.  DESCRIPTION OF PROCEDURE:  The patient was brought from preoperative holding where informed consent was documented and final issues addressed with the patient.  He was brought to the operating room and placed supine on the operating table and general  anesthesia was induced under invasive hemodynamic monitoring.  The patient was prepped and draped as a sterile field and a transesophageal echo probe was placed by the anesthesia team.  A proper time-out was performed.  A sternal incision was made as the  saphenous vein was harvested endoscopically from the right leg.  The left internal mammary artery was harvested as a pedicle graft from its origin at the subclavian vessels.  It was a small vessel 1.4 mm in diameter, but with adequate flow.  The sternal retractor  was placed, and the pericardium was opened and suspended.  Pursestrings were placed in ascending aorta and right atrium and after heparin was  administered, the patient was cannulated and placed on bypass.  The coronaries were  identified for grafting, and the mammary artery and vein grafts were prepared for the distal anastomoses.  Cardioplegic cannulas were placed both antegrade and retrograde cold blood cardioplegia.  The patient was cooled to 32 degrees, and the aortic  crossclamp was applied.  One liter of cold blood cardioplegia was delivered in split doses between the antegrade aortic and retrograde coronary sinus catheters.  The cardioplegia dosing was performed every 20 minutes while the cross clamp was in place.  The distal coronary anastomoses were performed.  The first distal anastomosis was the RCA.  Just after the takeoff of the RV marginal, the vessel was a 1.5 mm vessel, but with tight proximal stenosis.  Distally, a 1.5 mm probe passed.  A reverse  saphenous vein was sewn end-to-side with running 7-0 Prolene with good flow through the graft.  Cardioplegia was redosed.  The second distal anastomosis was to the diagonal branch of the LAD.  It was a smaller 1.0 mm vessel, proximal ostial 90% stenosis.  Reverse saphenous vein was sewn end-to-side with running 7-0 Prolene with good flow through the graft.  Cardioplegia was  redosed.  The third distal anastomosis was the LAD.  This was a very difficult target being diffusely narrowed and small distally and heavily diseased proximally.  The anastomosis was performed at the mid vessel where it was intramyocardial, heavily diseased with  plaque and under a layer of epicardial fat as well.  The left IMA pedicle was brought through an opening in the left lateral pericardium and was brought down onto the LAD and sewn end-to-side with running 8-0 Prolene.  This was a tedious and difficult  time consuming anastomosis due to the vessel quality and vessel size requiring a repeated anastomosis .  With release of the LIMA pedicle graft bulldog,  there was good flow and the bulldog was reapplied,  and the pedicle was secured to the epicardium.  Cardioplegia was redosed.  We then performed the 2 proximal vein anastomoses to the ascending aorta with a 4.5 mm punch and running 6-0 Prolene.  Prior to tying down the final proximal anastomosis, air was vented from the coronaries with a dose of retrograde warm blood  cardioplegia.  The crossclamp was removed.  Vein grafts were de-aired and opened.  Each had good flow and hemostasis was documented at the proximal and distal sites.  The mammary artery distal anastomosis was hemostatic.  The patient was cardioverted back to regular rhythm and we rewarmed and  reperfused.  Temporary pacing wires were applied.  The patient was started on low-dose milrinone.  The lungs reexpanded.  Ventilator was resumed.  The patient was weaned from cardiopulmonary bypass without difficulty.  Echo showed preservation of LV  function and adequate hemodynamics by PA catheter measurements.  Protamine was administered without adverse reaction.  The patient still had diffuse coagulopathy.  He was given 2 FFPs as indicated by the teg coagulation profile.  This improved  coagulation function.  The superior pericardial fat was closed over the aorta and vein grafts.  The anterior mediastinum and bilateral pleural tubes were placed and brought out through separate incisions.  The sternum was closed with wire.  The patient  remained stable.  The pectoralis fascia was closed with a running #1 Vicryl.  The subcutaneous  and skin layers were closed with running Vicryl and sterile dressings were applied.  Total cardiopulmonary bypass time was 157 minutes.  HN/NUANCE  D:07/09/2020 T:07/10/2020 JOB:[id number]

## 2020-07-10 NOTE — Hospital Course (Addendum)
HPI:   [name] is a very pleasant 74 year old gentleman with past medical history significant for stage II chronic kidney disease, hypertension, type 2 diabetes mellitus, dyslipidemia, and obstructive sleep apnea.  He has history of degenerative joint disease and is status post bilateral knee replacements   He developed a COVID infection in May 2021 and during hospitalization for treatment had a CT scan for evaluation of dyspnea.  He was discovered to have bilateral pulmonary emboli and subsequently was started on Plavix and Eliquis.  That study also revealed evidence of coronary calcifications.  He has been seen in follow-up by cardiology since that time.  An echocardiogram obtained in May 2021 demonstrated normal ejection fraction and normal mitral and aortic valve function.  He recently made a follow-up visit to his cardiologist and complained of having some dyspnea on exertion without any chest pain.  This prompted evaluation with a nuclear stress test that was markedly positive for ischemic changes in multiple regions.  Left heart catheterization was recommended and was carried out earlier today.  This demonstrated severe occlusive three-vessel coronary artery disease.  He was referred to CT surgery for consideration of coronary bypass grafting.  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  He lives with his daughter and wife.  His wife is currently recovering from rotator cuff surgery.  He has had 2 Covid immunizations but not enough time has yet elapsed for a booster. His last dose of Plavix was today.  Hospital Course:   On 07/09/2020 Mr. Leggitt underwent a CABG x 3 with Dr. Prescott Gum. He tolerated the procedure well and was transferred to the surgical ICU for continued care. He was extubated in a timely manner. His pain is well  controlled POD 1. We continued his chest tubes due to output. His lines were removed and he was off all vasoactive drips. He remains on low-dose milrinone. He continued to progress in the ICU. He did go into atrial fibrillation the early morning of 12/23. He was treated with IV Amiodarone with bolus and converted only to go back into afib and required a re-bolus. His left pleural chest tube was removed on 12/23.

## 2020-07-10 NOTE — Addendum Note (Signed)
Addendum  created 07/10/20 0651 by Josephine Igo, CRNA   Order list changed, Pharmacy for encounter modified

## 2020-07-11 ENCOUNTER — Inpatient Hospital Stay (HOSPITAL_COMMUNITY): Payer: Medicare Other

## 2020-07-11 LAB — COOXEMETRY PANEL
Carboxyhemoglobin: 0.9 % (ref 0.5–1.5)
Methemoglobin: 1.1 % (ref 0.0–1.5)
O2 Saturation: 52.6 %
Total hemoglobin: 9.1 g/dL — ABNORMAL LOW (ref 12.0–16.0)

## 2020-07-11 LAB — CBC
HCT: 27.1 % — ABNORMAL LOW (ref 39.0–52.0)
Hemoglobin: 8.8 g/dL — ABNORMAL LOW (ref 13.0–17.0)
MCH: 29.5 pg (ref 26.0–34.0)
MCHC: 32.5 g/dL (ref 30.0–36.0)
MCV: 90.9 fL (ref 80.0–100.0)
Platelets: 163 10*3/uL (ref 150–400)
RBC: 2.98 MIL/uL — ABNORMAL LOW (ref 4.22–5.81)
RDW: 15.2 % (ref 11.5–15.5)
WBC: 10.7 10*3/uL — ABNORMAL HIGH (ref 4.0–10.5)
nRBC: 0 % (ref 0.0–0.2)

## 2020-07-11 LAB — GLUCOSE, CAPILLARY
Glucose-Capillary: 178 mg/dL — ABNORMAL HIGH (ref 70–99)
Glucose-Capillary: 238 mg/dL — ABNORMAL HIGH (ref 70–99)
Glucose-Capillary: 240 mg/dL — ABNORMAL HIGH (ref 70–99)
Glucose-Capillary: 242 mg/dL — ABNORMAL HIGH (ref 70–99)
Glucose-Capillary: 288 mg/dL — ABNORMAL HIGH (ref 70–99)
Glucose-Capillary: 290 mg/dL — ABNORMAL HIGH (ref 70–99)
Glucose-Capillary: 316 mg/dL — ABNORMAL HIGH (ref 70–99)

## 2020-07-11 LAB — BASIC METABOLIC PANEL
Anion gap: 7 (ref 5–15)
BUN: 26 mg/dL — ABNORMAL HIGH (ref 8–23)
CO2: 23 mmol/L (ref 22–32)
Calcium: 7.4 mg/dL — ABNORMAL LOW (ref 8.9–10.3)
Chloride: 101 mmol/L (ref 98–111)
Creatinine, Ser: 1.54 mg/dL — ABNORMAL HIGH (ref 0.61–1.24)
GFR, Estimated: 47 mL/min — ABNORMAL LOW (ref >60)
Glucose, Bld: 242 mg/dL — ABNORMAL HIGH (ref 70–99)
Potassium: 4.2 mmol/L (ref 3.5–5.1)
Sodium: 131 mmol/L — ABNORMAL LOW (ref 135–145)

## 2020-07-11 MED ORDER — ENOXAPARIN SODIUM 30 MG/0.3ML ~~LOC~~ SOLN
30.0000 mg | SUBCUTANEOUS | Status: DC
  Administered 2020-07-11: 30 mg via SUBCUTANEOUS
  Filled 2020-07-11: qty 0.3

## 2020-07-11 MED ORDER — INSULIN DETEMIR 100 UNIT/ML ~~LOC~~ SOLN
18.0000 [IU] | Freq: Two times a day (BID) | SUBCUTANEOUS | Status: DC
  Administered 2020-07-11 – 2020-07-16 (×10): 18 [IU] via SUBCUTANEOUS
  Filled 2020-07-11 (×11): qty 0.18

## 2020-07-11 MED ORDER — BENZONATATE 100 MG PO CAPS
100.0000 mg | ORAL_CAPSULE | Freq: Three times a day (TID) | ORAL | Status: DC | PRN
  Administered 2020-07-11: 100 mg via ORAL
  Filled 2020-07-11: qty 1

## 2020-07-11 MED ORDER — INSULIN DETEMIR 100 UNIT/ML ~~LOC~~ SOLN
15.0000 [IU] | Freq: Two times a day (BID) | SUBCUTANEOUS | Status: DC
  Administered 2020-07-11: 15 [IU] via SUBCUTANEOUS
  Filled 2020-07-11 (×2): qty 0.15

## 2020-07-11 NOTE — Progress Notes (Signed)
EVENING ROUNDS NOTE :     [address]       [place] [address]             [phone]                 2 Days Post-Op Procedure(s) (LRB): CORONARY ARTERY BYPASS GRAFTING (CABG) TIMES THREE , USING LEFT INTERNAL MAMMARY ARTERY AND RIGHT LEG GREATER SAPHENOUS VEIN HARVESTED ENDOSCOPICALLY (N/A) TRANSESOPHAGEAL ECHOCARDIOGRAM (TEE) (N/A)   Total Length of Stay:  LOS: 7 days  Events:   No events Ambulated today    BP 95/61   Pulse 76   Temp 99.1 F (37.3 C) (Oral)   Resp 15   Ht 5\' 11"  (1.803 m)   Wt 82.7 kg   SpO2 92%   BMI 25.43 kg/m         . sodium chloride Stopped (07/10/20 1054)  . sodium chloride    . sodium chloride 10 mL/hr (07/09/20 1528)  . amiodarone 30 mg/hr (07/11/20 1100)  . lactated ringers Stopped (07/11/20 1057)  . milrinone 0.125 mcg/kg/min (07/11/20 1100)  . phenylephrine (NEO-SYNEPHRINE) Adult infusion Stopped (07/09/20 1535)    I/O last 3 completed shifts: In: 2431 [I.V.:1529.5; Blood:315; IV Piggyback:586.5] Out: 3005 [Urine:2235; Chest Tube:770]   CBC Latest Ref Rng & Units 07/11/2020 07/10/2020 07/10/2020  WBC 4.0 - 10.5 K/uL 10.7(H) 9.5 8.0  Hemoglobin 13.0 - 17.0 g/dL 8.8(L) 8.9(L) 7.9(L)  Hematocrit 39.0 - 52.0 % 27.1(L) 27.5(L) 24.3(L)  Platelets 150 - 400 K/uL 163 160 167    BMP Latest Ref Rng & Units 07/11/2020 07/10/2020 07/10/2020  Glucose 70 - 99 mg/dL 242(H) 209(H) 169(H)  BUN 8 - 23 mg/dL 26(H) 20 20  Creatinine 0.61 - 1.24 mg/dL 1.54(H) 1.41(H) 1.39(H)  Sodium 135 - 145 mmol/L 131(L) 135 138  Potassium 3.5 - 5.1 mmol/L 4.2 3.7 4.1  Chloride 98 - 111 mmol/L 101 104 109  CO2 22 - 32 mmol/L 23 21(L) 20(L)  Calcium 8.9 - 10.3 mg/dL 7.4(L) 7.7(L) 7.6(L)    ABG    Component Value Date/Time   PHART 7.329 (L) 07/09/2020 2300   PCO2ART 38.5 07/09/2020 2300   PO2ART 73 (L) 07/09/2020 2300   HCO3 19.9 (L) 07/09/2020 2300   TCO2 21 (L) 07/09/2020 2300   ACIDBASEDEF 5.0 (H) 07/09/2020 2300   O2SAT 52.6  07/11/2020 0309       Melodie Bouillon, MD 07/11/2020 5:52 PM

## 2020-07-11 NOTE — Evaluation (Signed)
Physical Therapy Evaluation Patient Details Name: Patrick Miranda MRN: [id number] DOB: 06-28-1946 Today's Date: 07/11/2020   History of Present Illness  74 y.o. male with history of CAD, CVA, OSA, DVT/PE, HTN, DM 2, PAF who underwent outpatient Lexiscan myoview which was abnormal and set up for outpatient cardiac cath found to have multivessel CAD.  CABG x 3 on12/20.  Clinical Impression  Pt admitted with above diagnosis. Pt was able to ambulate with EVa walker with min assist for sequencing steps and steering RW and mod cues for upright trunk.   Pt VSS until he was back in bed as his sats >90% on 4LO2 during walk but did drop once back in bed to 83% with PT encouraging pt to pursed lip breathe and use incentive spirometer. Sats improved after doing this and returned to >90%.  Will follow acutely. Pt currently with functional limitations due to the deficits listed below (see PT Problem List). Pt will benefit from skilled PT to increase their independence and safety with mobility to allow discharge to the venue listed below.      Follow Up Recommendations Home health PT;Supervision/Assistance - 24 hour    Equipment Recommendations  None recommended by PT    Recommendations for Other Services       Precautions / Restrictions Precautions Precautions: Fall;Sternal Precaution Booklet Issued: Yes (comment) Precaution Comments: Chest tube, oxygen Restrictions Weight Bearing Restrictions: No      Mobility  Bed Mobility Overal bed mobility: Needs Assistance Bed Mobility: Sit to Sidelying         Sit to sidelying: Mod assist General bed mobility comments: Assist for LEs into bed and to follow sternal precautions    Transfers Overall transfer level: Needs assistance Equipment used: Rolling walker (2 wheeled) Transfers: Sit to/from Stand Sit to Stand: Min assist;Mod assist         General transfer comment: Assist to stand with mod assist with cues for hands on knees for  sternal precautions from low recliner. Used momentum.  Ambulation/Gait Ambulation/Gait assistance: Min assist Gait Distance (Feet): 125 Feet Assistive device:  (EVa walker) Gait Pattern/deviations: Step-through pattern;Decreased stride length;Trunk flexed;Drifts right/left   Gait velocity interpretation: <1.8 ft/sec, indicate of risk for recurrent falls General Gait Details: Pt was able to ambulate with [name] walker with min assist to steer the [name] walker as well as cues for increasing step length and cues for upright trunk as pt maintains somewhat flexed posture.  Stairs            Wheelchair Mobility    Modified Rankin (Stroke Patients Only)       Balance Overall balance assessment: Needs assistance Sitting-balance support: No upper extremity supported;Feet supported Sitting balance-Leahy Scale: Fair     Standing balance support: Bilateral upper extremity supported;During functional activity Standing balance-Leahy Scale: Poor Standing balance comment: relies on UE support for balance                             Pertinent Vitals/Pain Pain Assessment: Faces Faces Pain Scale: Hurts little more Pain Location: incision Pain Descriptors / Indicators: Aching;Grimacing;Guarding Pain Intervention(s): Limited activity within patient's tolerance;Monitored during session;Repositioned    Home Living Family/patient expects to be discharged to:: Private residence Living Arrangements: Spouse/significant other;Children Available Help at Discharge: Family;Available 24 hours/day Type of Home: House Home Access: Level entry     Home Layout: One level;Laundry or work area in [place]: Environmental consultant - 2 wheels;Bedside commode;Shower seat;Grab  bars - tub/shower;Hand held shower head      Prior Function Level of Independence: Independent         Comments: drives, yardwork     Hand Dominance   Dominant Hand: Right    Extremity/Trunk Assessment   Upper  Extremity Assessment Upper Extremity Assessment: Defer to OT evaluation    Lower Extremity Assessment Lower Extremity Assessment: Generalized weakness    Cervical / Trunk Assessment Cervical / Trunk Assessment: Kyphotic  Communication   Communication: No difficulties  Cognition Arousal/Alertness: Awake/alert Behavior During Therapy: WFL for tasks assessed/performed Overall Cognitive Status: Within Functional Limits for tasks assessed                                        General Comments      Exercises General Exercises - Lower Extremity Long Arc Quad: AROM;Both;10 reps;Seated   Assessment/Plan    PT Assessment Patient needs continued PT services  PT Problem List Decreased activity tolerance;Decreased balance;Decreased mobility;Decreased knowledge of use of DME;Decreased safety awareness;Decreased knowledge of precautions;Cardiopulmonary status limiting activity       PT Treatment Interventions Gait training;DME instruction;Functional mobility training;Therapeutic activities;Therapeutic exercise;Balance training;Patient/family education    PT Goals (Current goals can be found in the Care Plan section)  Acute Rehab PT Goals Patient Stated Goal: to go home PT Goal Formulation: With patient Time For Goal Achievement: 07/25/20 Potential to Achieve Goals: Good    Frequency Min 3X/week   Barriers to discharge        Co-evaluation               AM-PAC PT "6 Clicks" Mobility  Outcome Measure Help needed turning from your back to your side while in a flat bed without using bedrails?: A Little Help needed moving from lying on your back to sitting on the side of a flat bed without using bedrails?: A Lot Help needed moving to and from a bed to a chair (including a wheelchair)?: A Lot Help needed standing up from a chair using your arms (e.g., wheelchair or bedside chair)?: A Lot Help needed to walk in hospital room?: A Little Help needed climbing 3-5  steps with a railing? : A Lot 6 Click Score: 14    End of Session Equipment Utilized During Treatment: Gait belt;Oxygen Activity Tolerance: Patient limited by fatigue Patient left: in bed;with call bell/phone within reach;with family/visitor present Nurse Communication: Mobility status PT Visit Diagnosis: Unsteadiness on feet (R26.81);Muscle weakness (generalized) (M62.81)    Time: [id number] PT Time Calculation (min) (ACUTE ONLY): 29 min   Charges:   PT Evaluation $PT Eval Moderate Complexity: 1 Mod PT Treatments $Gait Training: 8-22 mins        Merari Pion W,PT Acute Rehabilitation Services Pager:  [phone]  Office:  [place] 07/11/2020, 1:14 PM

## 2020-07-11 NOTE — Progress Notes (Addendum)
2 Days Post-Op Procedure(s) (LRB): CORONARY ARTERY BYPASS GRAFTING (CABG) TIMES THREE , USING LEFT INTERNAL MAMMARY ARTERY AND RIGHT LEG GREATER SAPHENOUS VEIN HARVESTED ENDOSCOPICALLY (N/A) TRANSESOPHAGEAL ECHOCARDIOGRAM (TEE) (N/A) Subjective: Remained in nsr on iv amio overnight coox 53%  on .125 mil with hx preop DMI Not able to walk in hall yesterday Objective: Vital signs in last 24 hours: Temp:  [99 F (37.2 C)-100.04 F (37.8 C)] 99.9 F (37.7 C) (12/22 0722) Pulse Rate:  [67-128] 80 (12/22 0700) Cardiac Rhythm: Normal sinus rhythm (12/22 0336) Resp:  [12-29] 29 (12/22 0700) BP: (96-147)/(49-84) 130/72 (12/22 0700) SpO2:  [90 %-100 %] 97 % (12/22 0700) Arterial Line BP: (118-156)/(44-50) 156/50 (12/21 1600) Weight:  [82.7 kg] 82.7 kg (12/22 0500)  Hemodynamic parameters for last 24 hours: PAP: (15-22)/(8-14) 17/9 CO:  [5.9 L/min-6.1 L/min] 6.1 L/min CI:  [3 L/min/m2-3.1 L/min/m2] 3.1 L/min/m2  Intake/Output from previous day: 12/21 0701 - 12/22 0700 In: 1540 [I.V.:1016; Blood:315; IV Piggyback:200] Out: 2185 [Urine:1665; Chest Tube:520] Intake/Output this shift: No intake/output data recorded.       Exam    General- alert and comfortable    Neck- no JVD, no cervical adenopathy palpable, no carotid bruit   Lungs- clear without rales, wheezes   Cor- regular rate and rhythm, no murmur , gallop   Abdomen- soft, non-tender   Extremities - warm, non-tender, minimal edema   Neuro- oriented, appropriate, no focal weakness   Lab Results: Recent Labs    07/10/20 1619 07/11/20 0304  WBC 9.5 10.7*  HGB 8.9* 8.8*  HCT 27.5* 27.1*  PLT 160 163   BMET:  Recent Labs    07/10/20 1619 07/11/20 0304  NA 135 131*  K 3.7 4.2  CL 104 101  CO2 21* 23  GLUCOSE 209* 242*  BUN 20 26*  CREATININE 1.41* 1.54*  CALCIUM 7.7* 7.4*    PT/INR:  Recent Labs    07/09/20 1446  LABPROT 16.1*  INR 1.3*   ABG    Component Value Date/Time   PHART 7.329 (L) 07/09/2020  2300   HCO3 19.9 (L) 07/09/2020 2300   TCO2 21 (L) 07/09/2020 2300   ACIDBASEDEF 5.0 (H) 07/09/2020 2300   O2SAT 52.6 07/11/2020 0309   CBG (last 3)  Recent Labs    07/10/20 1944 07/11/20 0007 07/11/20 0402  GLUCAP 276* 288* 178*    Assessment/Plan: S/P Procedure(s) (LRB): CORONARY ARTERY BYPASS GRAFTING (CABG) TIMES THREE , USING LEFT INTERNAL MAMMARY ARTERY AND RIGHT LEG GREATER SAPHENOUS VEIN HARVESTED ENDOSCOPICALLY (N/A) TRANSESOPHAGEAL ECHOCARDIOGRAM (TEE) (N/A) Mobilize Diuresis d/c tubes/lines cont foley one more day for hx PBH  cont iv amio for 24 hrs Cont milrinone 24 hrs DC chest tubes Increase levemir for  Adequate DM control  LOS: 7 days    Patrick Miranda 07/11/2020

## 2020-07-12 ENCOUNTER — Inpatient Hospital Stay (HOSPITAL_COMMUNITY): Payer: Medicare Other

## 2020-07-12 ENCOUNTER — Ambulatory Visit: Payer: Medicare Other | Admitting: Internal Medicine

## 2020-07-12 LAB — TYPE AND SCREEN
ABO/RH(D): O POS
Antibody Screen: NEGATIVE
Unit division: 0
Unit division: 0
Unit division: 0
Unit division: 0

## 2020-07-12 LAB — CBC
HCT: 26.7 % — ABNORMAL LOW (ref 39.0–52.0)
Hemoglobin: 8.6 g/dL — ABNORMAL LOW (ref 13.0–17.0)
MCH: 29.1 pg (ref 26.0–34.0)
MCHC: 32.2 g/dL (ref 30.0–36.0)
MCV: 90.2 fL (ref 80.0–100.0)
Platelets: 178 10*3/uL (ref 150–400)
RBC: 2.96 MIL/uL — ABNORMAL LOW (ref 4.22–5.81)
RDW: 15.3 % (ref 11.5–15.5)
WBC: 10.8 10*3/uL — ABNORMAL HIGH (ref 4.0–10.5)
nRBC: 0 % (ref 0.0–0.2)

## 2020-07-12 LAB — BASIC METABOLIC PANEL
Anion gap: 7 (ref 5–15)
BUN: 29 mg/dL — ABNORMAL HIGH (ref 8–23)
CO2: 24 mmol/L (ref 22–32)
Calcium: 7.6 mg/dL — ABNORMAL LOW (ref 8.9–10.3)
Chloride: 102 mmol/L (ref 98–111)
Creatinine, Ser: 1.46 mg/dL — ABNORMAL HIGH (ref 0.61–1.24)
GFR, Estimated: 50 mL/min — ABNORMAL LOW (ref >60)
Glucose, Bld: 165 mg/dL — ABNORMAL HIGH (ref 70–99)
Potassium: 3.9 mmol/L (ref 3.5–5.1)
Sodium: 133 mmol/L — ABNORMAL LOW (ref 135–145)

## 2020-07-12 LAB — BPAM RBC
Blood Product Expiration Date: 202201132359
Blood Product Expiration Date: 202201162359
Blood Product Expiration Date: 202201172359
Blood Product Expiration Date: 202201172359
ISSUE DATE / TIME: 202112161558
ISSUE DATE / TIME: 202112211024
ISSUE DATE / TIME: 202112211724
ISSUE DATE / TIME: 202112212050
Unit Type and Rh: 5100
Unit Type and Rh: 5100
Unit Type and Rh: 5100
Unit Type and Rh: 5100

## 2020-07-12 LAB — COOXEMETRY PANEL
Carboxyhemoglobin: 0.9 % (ref 0.5–1.5)
Methemoglobin: 0.9 % (ref 0.0–1.5)
O2 Saturation: 53.7 %
Total hemoglobin: 8.9 g/dL — ABNORMAL LOW (ref 12.0–16.0)

## 2020-07-12 LAB — GLUCOSE, CAPILLARY
Glucose-Capillary: 165 mg/dL — ABNORMAL HIGH (ref 70–99)
Glucose-Capillary: 173 mg/dL — ABNORMAL HIGH (ref 70–99)
Glucose-Capillary: 236 mg/dL — ABNORMAL HIGH (ref 70–99)
Glucose-Capillary: 281 mg/dL — ABNORMAL HIGH (ref 70–99)
Glucose-Capillary: 298 mg/dL — ABNORMAL HIGH (ref 70–99)

## 2020-07-12 MED ORDER — AMIODARONE LOAD VIA INFUSION
150.0000 mg | Freq: Once | INTRAVENOUS | Status: AC
  Administered 2020-07-12: 150 mg via INTRAVENOUS
  Filled 2020-07-12: qty 83.34

## 2020-07-12 MED ORDER — METOPROLOL TARTRATE 25 MG/10 ML ORAL SUSPENSION: 25.0000 mg | Freq: Two times a day (BID) | ORAL | Status: DC

## 2020-07-12 MED ORDER — METOPROLOL TARTRATE 12.5 MG HALF TABLET: 12.5000 mg | ORAL_TABLET | Freq: Two times a day (BID) | ORAL | Status: DC

## 2020-07-12 MED ORDER — METOPROLOL TARTRATE 25 MG PO TABS
25.0000 mg | ORAL_TABLET | Freq: Two times a day (BID) | ORAL | Status: DC
  Administered 2020-07-12 – 2020-07-13 (×4): 25 mg via ORAL
  Filled 2020-07-12 (×4): qty 1

## 2020-07-12 MED ORDER — INSULIN ASPART 100 UNIT/ML ~~LOC~~ SOLN
0.0000 [IU] | Freq: Three times a day (TID) | SUBCUTANEOUS | Status: DC
  Administered 2020-07-12: 12 [IU] via SUBCUTANEOUS
  Administered 2020-07-12 (×2): 8 [IU] via SUBCUTANEOUS
  Administered 2020-07-13: 4 [IU] via SUBCUTANEOUS
  Administered 2020-07-13: 2 [IU] via SUBCUTANEOUS
  Administered 2020-07-13: 4 [IU] via SUBCUTANEOUS
  Administered 2020-07-13: 8 [IU] via SUBCUTANEOUS
  Administered 2020-07-14: 2 [IU] via SUBCUTANEOUS
  Administered 2020-07-14: 8 [IU] via SUBCUTANEOUS
  Administered 2020-07-14: 4 [IU] via SUBCUTANEOUS
  Administered 2020-07-14 – 2020-07-15 (×3): 2 [IU] via SUBCUTANEOUS
  Administered 2020-07-15: 12 [IU] via SUBCUTANEOUS

## 2020-07-12 MED ORDER — AMIODARONE LOAD VIA INFUSION
150.0000 mg | Freq: Once | INTRAVENOUS | Status: AC
  Administered 2020-07-12: 150 mg via INTRAVENOUS

## 2020-07-12 MED ORDER — PANTOPRAZOLE SODIUM 40 MG PO TBEC
40.0000 mg | DELAYED_RELEASE_TABLET | Freq: Two times a day (BID) | ORAL | Status: DC
  Administered 2020-07-12 – 2020-07-16 (×9): 40 mg via ORAL
  Filled 2020-07-12 (×10): qty 1

## 2020-07-12 MED ORDER — SORBITOL 70 % SOLN
30.0000 mL | Freq: Once | Status: AC
  Administered 2020-07-12: 30 mL via ORAL
  Filled 2020-07-12: qty 30

## 2020-07-12 MED ORDER — POTASSIUM CHLORIDE 10 MEQ/50ML IV SOLN
10.0000 meq | INTRAVENOUS | Status: AC
  Administered 2020-07-12 (×2): 10 meq via INTRAVENOUS
  Filled 2020-07-12 (×2): qty 50

## 2020-07-12 MED ORDER — METOPROLOL TARTRATE 25 MG/10 ML ORAL SUSPENSION
25.0000 mg | Freq: Two times a day (BID) | ORAL | Status: DC
  Filled 2020-07-12 (×3): qty 10

## 2020-07-12 MED FILL — Sodium Chloride IV Soln 0.9%: INTRAVENOUS | Qty: 2000 | Status: AC

## 2020-07-12 MED FILL — Albumin, Human Inj 5%: INTRAVENOUS | Qty: 250 | Status: AC

## 2020-07-12 MED FILL — Heparin Sodium (Porcine) Inj 1000 Unit/ML: INTRAMUSCULAR | Qty: 20 | Status: AC

## 2020-07-12 MED FILL — Electrolyte-R (PH 7.4) Solution: INTRAVENOUS | Qty: 5000 | Status: AC

## 2020-07-12 MED FILL — Lidocaine HCl Local Soln Prefilled Syringe 100 MG/5ML (2%): INTRAMUSCULAR | Qty: 10 | Status: AC

## 2020-07-12 MED FILL — Mannitol IV Soln 20%: INTRAVENOUS | Qty: 500 | Status: AC

## 2020-07-12 NOTE — Progress Notes (Addendum)
      [address]       [place], [address]             [phone]      3 Days Post-Op Procedure(s) (LRB): CORONARY ARTERY BYPASS GRAFTING (CABG) TIMES THREE , USING LEFT INTERNAL MAMMARY ARTERY AND RIGHT LEG GREATER SAPHENOUS VEIN HARVESTED ENDOSCOPICALLY (N/A) TRANSESOPHAGEAL ECHOCARDIOGRAM (TEE) (N/A) Subjective: Feels okay this morning.   Objective: Vital signs in last 24 hours: Temp:  [97.9 F (36.6 C)-99.3 F (37.4 C)] 97.9 F (36.6 C) (12/23 0700) Pulse Rate:  [67-136] 77 (12/23 0500) Cardiac Rhythm: Normal sinus rhythm (12/23 0400) Resp:  [9-28] 20 (12/23 0500) BP: (95-136)/(47-123) 120/67 (12/23 0500) SpO2:  [85 %-100 %] 96 % (12/23 0500) Weight:  [85.8 kg] 85.8 kg (12/23 0500)     Intake/Output from previous day: 12/22 0701 - 12/23 0700 In: 1316 [P.O.:720; I.V.:587.3; IV Piggyback:8.7] Out: 4665 [Urine:2570; Chest Tube:600] Intake/Output this shift: No intake/output data recorded.  General appearance: alert, cooperative and no distress Heart: irregularly irregular rhythm Lungs: clear to auscultation bilaterally Abdomen: soft, non-tender; bowel sounds normal; no masses,  no organomegaly Extremities: 1+ non pitting upper and lower ext edema Wound: clean and dry sternal incision  Lab Results: Recent Labs    07/11/20 0304 07/12/20 0453  WBC 10.7* 10.8*  HGB 8.8* 8.6*  HCT 27.1* 26.7*  PLT 163 178   BMET:  Recent Labs    07/11/20 0304 07/12/20 0453  NA 131* 133*  K 4.2 3.9  CL 101 102  CO2 23 24  GLUCOSE 242* 165*  BUN 26* 29*  CREATININE 1.54* 1.46*  CALCIUM 7.4* 7.6*    PT/INR:  Recent Labs    07/09/20 1446  LABPROT 16.1*  INR 1.3*   ABG    Component Value Date/Time   PHART 7.329 (L) 07/09/2020 2300   HCO3 19.9 (L) 07/09/2020 2300   TCO2 21 (L) 07/09/2020 2300   ACIDBASEDEF 5.0 (H) 07/09/2020 2300   O2SAT 53.7 07/12/2020 0446   CBG (last 3)  Recent Labs    07/11/20 2318 07/12/20 0330 07/12/20 0717   GLUCAP 242* 165* 173*    Assessment/Plan: S/P Procedure(s) (LRB): CORONARY ARTERY BYPASS GRAFTING (CABG) TIMES THREE , USING LEFT INTERNAL MAMMARY ARTERY AND RIGHT LEG GREATER SAPHENOUS VEIN HARVESTED ENDOSCOPICALLY (N/A) TRANSESOPHAGEAL ECHOCARDIOGRAM (TEE) (N/A)  1. CV-Afib rate 130s-140s. On IV Amio with bolus at 3am, Re-bolus this morning. Remains on low-dose milrinone-will discontinue. Continue asa, statin, and metoprolol. BP has been low at times. Not on anticoagulation.  2. Pulm-CXR: stable with atelectasis.  Left Pleural chest tube out today.  3. Renal- creatinine 1.46, electrolytes okay 4. H and H 8.6/26.7, will add Trinsicon. Expected acute blood loss anemia.  5. Endo-blood glucose not well controlled. Increase Levemir.   Plan: Bolus of Amio, replace potassium, leave chest tubes  this morning. Work on rhythm today. Keep in the ICU.     LOS: 8 days    Elgie Collard 07/12/2020   patient examined and medical record reviewed,agree with above note. Will continue iv  Tharon Aquas Trigt III 07/12/2020

## 2020-07-12 NOTE — Discharge Instructions (Signed)

## 2020-07-12 NOTE — Discharge Summary (Addendum)
[address]       [place] [address]             [phone]    Physician Discharge Summary  Patient ID: Patrick Miranda MRN: [id number] DOB/AGE: 12-02-1945 74 y.o.  Admit date: 07/04/2020 Discharge date: 07/16/2020  Admission Diagnoses:  Patient Active Problem List   Diagnosis Date Noted  . CAD in native artery 07/04/2020  . Abnormal nuclear stress test   . COVID-19 virus infection 12/02/2019  . Multiple subsegmental pulmonary emboli without acute cor pulmonale ([place])   . New onset a-fib ([place])   . NSTEMI (non-ST elevated myocardial infarction) ([place])   . Hypersomnia 08/30/2019  . History of adenomatous polyp of colon 05/06/2018  . Vasomotor rhinitis 07/18/2017  . Arthritis of knee, right 01/06/2014  . Brainstem stroke ([place]) 09/07/2011    Class: Acute  . Dysarthria 09/05/2011    Class: Acute  . Chronic kidney disease (CKD), stage II (mild) 09/05/2011    Class: Chronic  . Hypertension 09/05/2011    Class: Chronic  . DM (diabetes mellitus), type 2 ([place]) 09/05/2011    Class: Chronic  . Hyperlipidemia 09/05/2011    Class: Chronic  . Obstructive sleep apnea 09/05/2011    Class: Chronic  . Anemia 09/05/2011    Class: Chronic   Discharge Diagnoses:  Patient Active Problem List   Diagnosis Date Noted  . S/P CABG x 3 07/09/2020  . CAD in native artery 07/04/2020  . Abnormal nuclear stress test   . COVID-19 virus infection 12/02/2019  . Multiple subsegmental pulmonary emboli without acute cor pulmonale ([place])   . New onset a-fib ([place])   . NSTEMI (non-ST elevated myocardial infarction) ([place])   . Hypersomnia 08/30/2019  . History of adenomatous polyp of colon 05/06/2018  . Vasomotor rhinitis 07/18/2017  . Arthritis of knee, right 01/06/2014  . Brainstem stroke ([place]) 09/07/2011    Class: Acute  . Dysarthria 09/05/2011    Class: Acute  . Chronic kidney disease (CKD), stage II (mild) 09/05/2011    Class: Chronic  . Hypertension 09/05/2011     Class: Chronic  . DM (diabetes mellitus), type 2 ([place]) 09/05/2011    Class: Chronic  . Hyperlipidemia 09/05/2011    Class: Chronic  . Obstructive sleep apnea 09/05/2011    Class: Chronic  . Anemia 09/05/2011    Class: Chronic    Discharged Condition: good  HPI:   Mr. Camuso is a very pleasant 74 year old gentleman with past medical history significant for stage II chronic kidney disease, hypertension, type 2 diabetes mellitus, dyslipidemia, and obstructive sleep apnea.  He has history of degenerative joint disease and is status post bilateral knee replacements   He developed a COVID infection in May 2021 and during hospitalization for treatment had a CT scan for evaluation of dyspnea.  He was discovered to have bilateral pulmonary emboli and subsequently was started on Plavix and Eliquis.  That study also revealed evidence of coronary calcifications.  He has been seen in follow-up by cardiology since that time.  An echocardiogram obtained in May 2021 demonstrated normal ejection fraction and normal mitral and aortic valve function.  He recently made a follow-up visit to his cardiologist and complained of having some dyspnea on exertion without any chest pain.  This prompted evaluation with a nuclear stress test that was markedly positive for ischemic changes in multiple regions.  Left heart catheterization was recommended and was carried out earlier today.  This demonstrated severe  occlusive three-vessel coronary artery disease.  He was referred to CT surgery for consideration of coronary bypass grafting.  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  He lives with his daughter and wife.  His wife is currently recovering from rotator cuff surgery.  He has had 2 Covid immunizations but not enough time has yet elapsed for a booster. His  last dose of Plavix was today.  Hospital Course:   On 07/09/2020 Mr. Aurich underwent a CABG x 3 with Dr. Prescott Gum. He tolerated the procedure well and was transferred to the surgical ICU for continued care. He was extubated in a timely manner. His pain is well controlled POD 1. We continued his chest tubes due to output. His lines were removed and he was off all vasoactive drips. He remains on low-dose milrinone. He continued to progress in the ICU. He did go into atrial fibrillation the early morning of 12/23. He was treated with IV Amiodarone with bolus and converted only to go back into afib and required a re-bolus. His left pleural chest tube was removed on 12/23. He was transferred to 4E on 12/24. The atrial fibrillation persisted so an additional bolus of amiodarone 150mg  was given along with supplemental KCl and MgSO4. He converted to stable SR. he has chronic renal insufficiency and his creatinine did bump a bit while on Lasix.  This has subsequently been discontinued.  Creatinine on 07/16/2020 was 1.66.  He does have an expected acute blood loss anemia which is stabilized.  Most recent hemoglobin hematocrit dated 07/16/2020 are 9.2/26.4 respectively.  He is on Trinsicon.  His pacer wires were removed without complication. Follow up CXR showed clear lung fields without significant effusions or infiltrates. He regained independent mobility and incisions healed with no sign of complication. He was felt to be ready for discharge to home today.       Consults: cardiology  Significant Diagnostic Studies: angiography:    Ost LAD to Prox LAD lesion is 40% stenosed.  2nd Diag lesion is 90% stenosed.  1st Diag-1 lesion is 95% stenosed.  1st Diag-2 lesion is 90% stenosed with 90% stenosed side branch in Lat 1st Diag.  Mid LAD-1 lesion is 90% stenosed.  Mid LAD-2 lesion is 70% stenosed.  Lat 2nd Mrg lesion is 95% stenosed.  1st Mrg lesion is 20% stenosed.  Prox RCA lesion is 95%  stenosed.  Dist LAD lesion is 50% stenosed.   Severe coronary obstructive disease with significant calcification involving the LAD.  The LAD is diffusely diseased extending from the proximal to mid segment with 40% diffuse proximal stenosis, calcified 90% stenosis on a bend in the vessel in the region of the first and second diagonal vessels with 95% stenoses in the ostium of the first diagonal and 90% in the second diagonal, 70% mid stenosis and 50% mid distal stenoses.  The circumflex vessel has 20% smooth narrowing in the first marginal branch with 95% focal stenosis in a branch of the second marginal vessel.  The RCA has a focal 95% mid stenosis with reduced antegrade distal flow secondary to competitive left-to-right collateralization.  Normal LV function with EF estimated at 55 mm; LVEDP 12 mm Hg.  Treatments: surgery:   DATE OF PROCEDURE:  07/09/2020  OPERATION: 1.  Coronary artery bypass grafting x3 (left internal mammary artery to left anterior  descending, saphenous vein graft to diagonal, saphenous vein graft to RCA). 2.  Endoscopic harvest of right leg greater saphenous vein.  SURGEON:  Ivin Poot, MD  ASSISTANT:  Nicholes Rough, PA-C.   Discharge Exam: Blood pressure 131/65, pulse 80, temperature 99.5 F (37.5 C), temperature source Oral, resp. rate 20, height 5\' 11"  (1.803 m), weight 78.4 kg, SpO2 96 %.   General appearance: alert, cooperative and no distress Heart: regular rate and rhythm Lungs: clear to auscultation bilaterally Abdomen: benign Extremities: no edema Wound: incis healing well  Discharge Medications:  The patient has been discharged on:   1.Beta Blocker:  Yes [   ]                              No   [  y ]                              If No, reason:  2.Ace Inhibitor/ARB: Yes [   ]                                     No  [   n ]                                     If No, reason:renal insuff  3.Statin:   Yes [ y  ]                   No  [   ]                  If No, reason:  4.Ecasa:  Yes  [ y  ]                  No   [   ]                  If No, reason:    Discharge Instructions    Discharge patient   Complete by: As directed    Discharge disposition: 01-Home or Self Care   Discharge patient date: 07/16/2020     Allergies as of 07/16/2020      Reactions   Lipitor [atorvastatin] Other (See Comments)   Muscle pain      Medication List    STOP taking these medications   benzonatate 100 MG capsule Commonly known as: TESSALON   carvedilol 12.5 MG tablet Commonly known as: COREG   ipratropium 0.03 % nasal spray Commonly known as: ATROVENT     TAKE these medications   amiodarone 200 MG tablet Commonly known as: PACERONE Take 1 tablet (200 mg total) by mouth 2 (two) times daily.   aspirin 325 MG EC tablet Take 1 tablet (325 mg total) by mouth daily.   BD Pen Needle Nano U/F 32G X 4 MM Misc Generic drug: Insulin Pen Needle 1 each by Other route daily.   Empagliflozin-metFORMIN HCl 11-998 MG Tabs Take 1 tablet by mouth 2 (two) times daily.   fenofibrate 160 MG tablet Take 160 mg by mouth daily.   ferrous [id number] C-folic acid capsule Commonly known as: TRINSICON / FOLTRIN Take 1 capsule by mouth 2 (two) times daily after a meal.  HumaLOG KwikPen 100 UNIT/ML KwikPen Generic drug: insulin lispro Inject 10 Units into the skin with breakfast, with lunch, and with evening meal.   methylphenidate 5 MG tablet Commonly known as: RITALIN Take 5 mg by mouth once a week. On Sunday mornings   metoprolol tartrate 25 MG tablet Commonly known as: LOPRESSOR Take 1 tablet (25 mg total) by mouth 2 (two) times daily.   OneTouch Verio test strip Generic drug: glucose blood 1 each by Other route in the morning, at noon, and at bedtime.   rosuvastatin 20 MG tablet Commonly known as: CRESTOR Take 1 tablet (20 mg total) by mouth daily.   Toujeo SoloStar 300 UNIT/ML Solostar Pen Generic  drug: insulin glargine (1 Unit Dial) Inject 30 Units into the skin daily.   traMADol 50 MG tablet Commonly known as: ULTRAM Take 1 tablet (50 mg total) by mouth every 6 (six) hours as needed for moderate pain.        Signed: John Giovanni, PA-C 07/16/2020, 8:36 AM  patient examined and medical record reviewed,agree with above note.Dc instructions on wound care and activity have been reviewed with the patient Tharon Aquas Trigt III 07/16/2020

## 2020-07-12 NOTE — Progress Notes (Signed)
EVENING ROUNDS NOTE :     [address]       [place] [address]             [phone]                 3 Days Post-Op Procedure(s) (LRB): CORONARY ARTERY BYPASS GRAFTING (CABG) TIMES THREE , USING LEFT INTERNAL MAMMARY ARTERY AND RIGHT LEG GREATER SAPHENOUS VEIN HARVESTED ENDOSCOPICALLY (N/A) TRANSESOPHAGEAL ECHOCARDIOGRAM (TEE) (N/A)   Total Length of Stay:  LOS: 8 days  Events:   No events Resting in chair     BP 135/74   Pulse 75   Temp 97.9 F (36.6 C) (Oral)   Resp (!) 24   Ht 5\' 11"  (1.803 m)   Wt 85.8 kg   SpO2 98%   BMI 26.38 kg/m         . sodium chloride Stopped (07/10/20 1054)  . sodium chloride    . sodium chloride 10 mL/hr (07/09/20 1528)  . amiodarone 30 mg/hr (07/12/20 1600)  . lactated ringers Stopped (07/11/20 1057)    I/O last 3 completed shifts: In: 2162.9 [P.O.:720; I.V.:1334.2; IV Piggyback:108.7] Out: 0929 [Urine:3445; Chest Tube:720]   CBC Latest Ref Rng & Units 07/12/2020 07/11/2020 07/10/2020  WBC 4.0 - 10.5 K/uL 10.8(H) 10.7(H) 9.5  Hemoglobin 13.0 - 17.0 g/dL 8.6(L) 8.8(L) 8.9(L)  Hematocrit 39.0 - 52.0 % 26.7(L) 27.1(L) 27.5(L)  Platelets 150 - 400 K/uL 178 163 160    BMP Latest Ref Rng & Units 07/12/2020 07/11/2020 07/10/2020  Glucose 70 - 99 mg/dL 165(H) 242(H) 209(H)  BUN 8 - 23 mg/dL 29(H) 26(H) 20  Creatinine 0.61 - 1.24 mg/dL 1.46(H) 1.54(H) 1.41(H)  Sodium 135 - 145 mmol/L 133(L) 131(L) 135  Potassium 3.5 - 5.1 mmol/L 3.9 4.2 3.7  Chloride 98 - 111 mmol/L 102 101 104  CO2 22 - 32 mmol/L 24 23 21(L)  Calcium 8.9 - 10.3 mg/dL 7.6(L) 7.4(L) 7.7(L)    ABG    Component Value Date/Time   PHART 7.329 (L) 07/09/2020 2300   PCO2ART 38.5 07/09/2020 2300   PO2ART 73 (L) 07/09/2020 2300   HCO3 19.9 (L) 07/09/2020 2300   TCO2 21 (L) 07/09/2020 2300   ACIDBASEDEF 5.0 (H) 07/09/2020 2300   O2SAT 53.7 07/12/2020 0446       Melodie Bouillon, MD 07/12/2020 4:39 PM

## 2020-07-12 NOTE — Progress Notes (Signed)
Pt placed self on home cpap.

## 2020-07-12 NOTE — Progress Notes (Signed)
Pt got his home CPAP unit and said he would place himself on it and resting well at this time.

## 2020-07-13 ENCOUNTER — Inpatient Hospital Stay (HOSPITAL_COMMUNITY): Payer: Medicare Other

## 2020-07-13 LAB — GLUCOSE, CAPILLARY
Glucose-Capillary: 165 mg/dL — ABNORMAL HIGH (ref 70–99)
Glucose-Capillary: 167 mg/dL — ABNORMAL HIGH (ref 70–99)
Glucose-Capillary: 169 mg/dL — ABNORMAL HIGH (ref 70–99)
Glucose-Capillary: 180 mg/dL — ABNORMAL HIGH (ref 70–99)
Glucose-Capillary: 196 mg/dL — ABNORMAL HIGH (ref 70–99)
Glucose-Capillary: 214 mg/dL — ABNORMAL HIGH (ref 70–99)

## 2020-07-13 LAB — CBC
HCT: 27.3 % — ABNORMAL LOW (ref 39.0–52.0)
Hemoglobin: 8.8 g/dL — ABNORMAL LOW (ref 13.0–17.0)
MCH: 28.9 pg (ref 26.0–34.0)
MCHC: 32.2 g/dL (ref 30.0–36.0)
MCV: 89.8 fL (ref 80.0–100.0)
Platelets: 240 10*3/uL (ref 150–400)
RBC: 3.04 MIL/uL — ABNORMAL LOW (ref 4.22–5.81)
RDW: 15.6 % — ABNORMAL HIGH (ref 11.5–15.5)
WBC: 8.9 10*3/uL (ref 4.0–10.5)
nRBC: 0 % (ref 0.0–0.2)

## 2020-07-13 LAB — COOXEMETRY PANEL
Carboxyhemoglobin: 1.1 % (ref 0.5–1.5)
Methemoglobin: 0.9 % (ref 0.0–1.5)
O2 Saturation: 65 %
Total hemoglobin: 9 g/dL — ABNORMAL LOW (ref 12.0–16.0)

## 2020-07-13 LAB — BASIC METABOLIC PANEL
Anion gap: 9 (ref 5–15)
BUN: 32 mg/dL — ABNORMAL HIGH (ref 8–23)
CO2: 23 mmol/L (ref 22–32)
Calcium: 7.8 mg/dL — ABNORMAL LOW (ref 8.9–10.3)
Chloride: 100 mmol/L (ref 98–111)
Creatinine, Ser: 1.42 mg/dL — ABNORMAL HIGH (ref 0.61–1.24)
GFR, Estimated: 52 mL/min — ABNORMAL LOW (ref >60)
Glucose, Bld: 184 mg/dL — ABNORMAL HIGH (ref 70–99)
Potassium: 4.2 mmol/L (ref 3.5–5.1)
Sodium: 132 mmol/L — ABNORMAL LOW (ref 135–145)

## 2020-07-13 MED ORDER — AMIODARONE LOAD VIA INFUSION
150.0000 mg | Freq: Once | INTRAVENOUS | Status: AC
  Administered 2020-07-13: 150 mg via INTRAVENOUS
  Filled 2020-07-13: qty 83.34

## 2020-07-13 MED ORDER — AMIODARONE HCL 200 MG PO TABS
400.0000 mg | ORAL_TABLET | Freq: Two times a day (BID) | ORAL | Status: DC
  Administered 2020-07-13 – 2020-07-16 (×6): 400 mg via ORAL
  Filled 2020-07-13 (×7): qty 2

## 2020-07-13 MED ORDER — METOLAZONE 5 MG PO TABS
5.0000 mg | ORAL_TABLET | Freq: Once | ORAL | Status: AC
  Administered 2020-07-13: 5 mg via ORAL
  Filled 2020-07-13: qty 1

## 2020-07-13 MED ORDER — AMIODARONE HCL IN DEXTROSE 360-4.14 MG/200ML-% IV SOLN
INTRAVENOUS | Status: AC
  Administered 2020-07-13: 30 mg/h
  Filled 2020-07-13: qty 200

## 2020-07-13 MED ORDER — AMIODARONE LOAD VIA INFUSION: 150.0000 mg | Freq: Once | INTRAVENOUS | Status: DC

## 2020-07-13 MED ORDER — AMIODARONE HCL IN DEXTROSE 360-4.14 MG/200ML-% IV SOLN
30.0000 mg/h | INTRAVENOUS | Status: DC
  Administered 2020-07-13 – 2020-07-14 (×3): 30 mg/h via INTRAVENOUS
  Filled 2020-07-13 (×2): qty 200

## 2020-07-13 MED ORDER — AMIODARONE IV BOLUS ONLY 150 MG/100ML: 150.0000 mg | Freq: Once | INTRAVENOUS | Status: DC

## 2020-07-13 MED ORDER — FUROSEMIDE 10 MG/ML IJ SOLN: 20.0000 mg | Freq: Every day | INTRAMUSCULAR | Status: DC

## 2020-07-13 NOTE — Plan of Care (Signed)
  Problem: Education: Goal: Knowledge of General Education information will improve Description: Including pain rating scale, medication(s)/side effects and non-pharmacologic comfort measures Outcome: Progressing   Problem: Health Behavior/Discharge Planning: Goal: Ability to manage health-related needs will improve Outcome: Progressing   Problem: Clinical Measurements: Goal: Ability to maintain clinical measurements within normal limits will improve Outcome: Progressing Goal: Will remain free from infection Outcome: Progressing Goal: Diagnostic test results will improve Outcome: Progressing Goal: Respiratory complications will improve Outcome: Progressing Goal: Cardiovascular complication will be avoided Outcome: Progressing   Problem: Activity: Goal: Risk for activity intolerance will decrease Outcome: Progressing   Problem: Nutrition: Goal: Adequate nutrition will be maintained Outcome: Progressing   Problem: Coping: Goal: Level of anxiety will decrease Outcome: Progressing   Problem: Elimination: Goal: Will not experience complications related to bowel motility Outcome: Progressing Goal: Will not experience complications related to urinary retention Outcome: Progressing   Problem: Pain Managment: Goal: General experience of comfort will improve Outcome: Progressing   Problem: Safety: Goal: Ability to remain free from injury will improve Outcome: Progressing   Problem: Skin Integrity: Goal: Risk for impaired skin integrity will decrease Outcome: Progressing   Problem: Education: Goal: Understanding of CV disease, CV risk reduction, and recovery process will improve Outcome: Progressing Goal: Individualized Educational Video(s) Outcome: Progressing   Problem: Activity: Goal: Ability to return to baseline activity level will improve Outcome: Progressing   Problem: Cardiovascular: Goal: Ability to achieve and maintain adequate cardiovascular perfusion  will improve Outcome: Progressing   Problem: Health Behavior/Discharge Planning: Goal: Ability to safely manage health-related needs after discharge will improve Outcome: Progressing   Problem: Education: Goal: Will demonstrate proper wound care and an understanding of methods to prevent future damage Outcome: Progressing Goal: Knowledge of disease or condition will improve Outcome: Progressing Goal: Knowledge of the prescribed therapeutic regimen will improve Outcome: Progressing Goal: Individualized Educational Video(s) Outcome: Progressing   Problem: Activity: Goal: Risk for activity intolerance will decrease Outcome: Progressing   Problem: Cardiac: Goal: Will achieve and/or maintain hemodynamic stability Outcome: Progressing   Problem: Clinical Measurements: Goal: Postoperative complications will be avoided or minimized Outcome: Progressing   Problem: Respiratory: Goal: Respiratory status will improve Outcome: Progressing   Problem: Skin Integrity: Goal: Wound healing without signs and symptoms of infection Outcome: Progressing Goal: Risk for impaired skin integrity will decrease Outcome: Progressing   Problem: Urinary Elimination: Goal: Ability to achieve and maintain adequate renal perfusion and functioning will improve Outcome: Progressing   

## 2020-07-13 NOTE — Progress Notes (Signed)
4 Days Post-Op Procedure(s) (LRB): CORONARY ARTERY BYPASS GRAFTING (CABG) TIMES THREE , USING LEFT INTERNAL MAMMARY ARTERY AND RIGHT LEG GREATER SAPHENOUS VEIN HARVESTED ENDOSCOPICALLY (N/A) TRANSESOPHAGEAL ECHOCARDIOGRAM (TEE) (N/A) Subjective: Feels well  Objective: Vital signs in last 24 hours: Temp:  [97.9 F (36.6 C)-98.8 F (37.1 C)] 98.8 F (37.1 C) (12/24 0721) Pulse Rate:  [52-116] 72 (12/24 0700) Cardiac Rhythm: Normal sinus rhythm (12/24 0400) Resp:  [10-26] 21 (12/24 0700) BP: (100-145)/(57-85) 145/69 (12/24 0600) SpO2:  [78 %-100 %] 95 % (12/24 0700) Weight:  [85.9 kg] 85.9 kg (12/24 0500)  Hemodynamic parameters for last 24 hours:    Intake/Output from previous day: 12/23 0701 - 12/24 0700 In: 869.4 [P.O.:300; I.V.:569.4] Out: 2485 [Urine:2275; Chest Tube:210] Intake/Output this shift: No intake/output data recorded.       Exam    General- alert and comfortable. Incision clean, dry    Neck- no JVD, no cervical adenopathy palpable, no carotid bruit   Lungs- clear without rales, wheezes   Cor- regular rate and rhythm, no murmur , gallop   Abdomen- soft, non-tender   Extremities - warm, non-tender, minimal edema   Neuro- oriented, appropriate, no focal weakness   Lab Results: Recent Labs    07/12/20 0453 07/13/20 0554  WBC 10.8* 8.9  HGB 8.6* 8.8*  HCT 26.7* 27.3*  PLT 178 240   BMET:  Recent Labs    07/12/20 0453 07/13/20 0554  NA 133* 132*  K 3.9 4.2  CL 102 100  CO2 24 23  GLUCOSE 165* 184*  BUN 29* 32*  CREATININE 1.46* 1.42*  CALCIUM 7.6* 7.8*    PT/INR: No results for input(s): LABPROT, INR in the last 72 hours. ABG    Component Value Date/Time   PHART 7.329 (L) 07/09/2020 2300   HCO3 19.9 (L) 07/09/2020 2300   TCO2 21 (L) 07/09/2020 2300   ACIDBASEDEF 5.0 (H) 07/09/2020 2300   O2SAT 65.0 07/13/2020 0554   CBG (last 3)  Recent Labs    07/13/20 0000 07/13/20 0428 07/13/20 0719  GLUCAP 196* 165* 180*     Assessment/Plan: S/P Procedure(s) (LRB): CORONARY ARTERY BYPASS GRAFTING (CABG) TIMES THREE , USING LEFT INTERNAL MAMMARY ARTERY AND RIGHT LEG GREATER SAPHENOUS VEIN HARVESTED ENDOSCOPICALLY (N/A) TRANSESOPHAGEAL ECHOCARDIOGRAM (TEE) (N/A) Mobilize Diuresis Diabetes control d/c tubes/lines Plan for transfer to step-down: see transfer orders resume preop plavix at DC - hx CVA   LOS: 9 days    Patrick Miranda 07/13/2020

## 2020-07-13 NOTE — Progress Notes (Signed)
Pt went into a-fib w/ rate 80-100. Order had been placed to transition to PO amio. Left IV amio running and paged MD. While waiting for return page, HR became more rapid. MD paged again to ask about bolus. HR consistently in the 130s-140s. RN approached MD on unit and made aware of rate and rhythm. Verbal order received to continue amio and bolus 150mg . Hold transfer to 4E until rate under control, 4E RN made aware. BP stable. Pt asypompatic. Will continue to monitor closely.

## 2020-07-13 NOTE — Progress Notes (Signed)
Physical Therapy Treatment Patient Details Name: Patrick Miranda MRN: [id number] DOB: 1946/06/14 Today's Date: 07/13/2020    History of Present Illness 74 y.o. male with history of CAD, CVA, OSA, DVT/PE, HTN, DM 2, PAF who underwent outpatient Lexiscan myoview which was abnormal and set up for outpatient cardiac cath found to have multivessel CAD.  CABG x 3 on12/20.    PT Comments    Pt getting ready to transfer from [place] to 4E, agreeable to short walk before getting in wheelchair for transfer. Pt requires reminder for verbalization of sternal precautions, however is good with adherence in mobility. Pt is mod A for bed mobility and min A for transfers and ambulation with EVA walker. Pt HR high this morning but maintained in 100s bpm with ambulation. D/c plans remain appropriate. PT will continue to follow acutely.    Follow Up Recommendations  Home health PT;Supervision/Assistance - 24 hour     Equipment Recommendations  None recommended by PT       Precautions / Restrictions Precautions Precautions: Fall;Sternal Precaution Booklet Issued: Yes (comment) Precaution Comments: Chest tube, oxygen, need refresher on sternal precautions Restrictions Weight Bearing Restrictions: Yes Other Position/Activity Restrictions: sternal precautions    Mobility  Bed Mobility Overal bed mobility: Needs Assistance Bed Mobility: Rolling;Sidelying to Sit Rolling: Min guard Sidelying to sit: Mod assist       General bed mobility comments: min A for managing LE off bed and mod A for bringing trunk to upright  Transfers Overall transfer level: Needs assistance Equipment used: Rolling walker (2 wheeled) Patrick Miranda) Transfers: Sit to/from Stand Sit to Stand: Min assist         General transfer comment: min A for power up and steadying, good use of sternal pillow during power up  Ambulation/Gait Ambulation/Gait assistance: Herbalist (Feet): 200 Feet Assistive device:  (EVa  walker) Gait Pattern/deviations: Step-through pattern;Decreased stride length;Trunk flexed;Drifts right/left Gait velocity: slowed Gait velocity interpretation: <1.8 ft/sec, indicate of risk for recurrent falls General Gait Details: min A for steadying EVA walker, able to self correct flexed posture       Balance Overall balance assessment: Needs assistance Sitting-balance support: No upper extremity supported;Feet supported Sitting balance-Leahy Scale: Fair     Standing balance support: Bilateral upper extremity supported;During functional activity Standing balance-Leahy Scale: Poor Standing balance comment: relies on UE support for balance                            Cognition Arousal/Alertness: Awake/alert Behavior During Therapy: WFL for tasks assessed/performed Overall Cognitive Status: Within Functional Limits for tasks assessed                                           General Comments General comments (skin integrity, edema, etc.): HR in 100s with ambulation, 70s at rest, on 2L O2 via [place] with SaO2 100%O2, on RA remained 95%O2, with ambulation pleth wave poor and SaO2 dropped to 87%O2 but once waveform normalized 93%O2      Pertinent Vitals/Pain Pain Assessment: Faces Faces Pain Scale: Hurts little more Pain Location: incision with movement Pain Descriptors / Indicators: Aching;Grimacing;Guarding Pain Intervention(s): Limited activity within patient's tolerance;Monitored during session;Repositioned           PT Goals (current goals can now be found in the care plan section) Acute Rehab PT Goals Patient  Stated Goal: to go home PT Goal Formulation: With patient Time For Goal Achievement: 07/25/20 Potential to Achieve Goals: Good Progress towards PT goals: Progressing toward goals    Frequency    Min 3X/week      PT Plan Current plan remains appropriate       AM-PAC PT "6 Clicks" Mobility   Outcome Measure  Help needed  turning from your back to your side while in a flat bed without using bedrails?: A Little Help needed moving from lying on your back to sitting on the side of a flat bed without using bedrails?: A Lot Help needed moving to and from a bed to a chair (including a wheelchair)?: A Lot Help needed standing up from a chair using your arms (e.g., wheelchair or bedside chair)?: A Lot Help needed to walk in hospital room?: A Little Help needed climbing 3-5 steps with a railing? : A Lot 6 Click Score: 14    End of Session Equipment Utilized During Treatment: Gait belt;Oxygen Activity Tolerance: Patient tolerated treatment well Patient left: in bed;with call bell/phone within reach;with family/visitor present Nurse Communication: Mobility status PT Visit Diagnosis: Unsteadiness on feet (R26.81);Muscle weakness (generalized) (M62.81)     Time: 1310-1330 PT Time Calculation (min) (ACUTE ONLY): 20 min  Charges:  $Gait Training: 8-22 mins                     Patrick Miranda B. Migdalia Dk PT, DPT Acute Rehabilitation Services Pager [phone] Office [phone]    [place] 07/13/2020, 3:22 PM

## 2020-07-13 NOTE — Progress Notes (Signed)
Pt arrived to rm 2 from [place] on amio drip running 30 mg/hr through the transducer. Will keep the transducer till tomorrow per MD. Initiated tele. CHG wipe provided. VSS. Pt alert and oriented. Call bell within reach.   Lavenia Atlas, RN

## 2020-07-13 NOTE — Plan of Care (Signed)
  Problem: Education: Goal: Knowledge of General Education information will improve Description: Including pain rating scale, medication(s)/side effects and non-pharmacologic comfort measures Outcome: Progressing   Problem: Health Behavior/Discharge Planning: Goal: Ability to manage health-related needs will improve Outcome: Progressing   Problem: Clinical Measurements: Goal: Ability to maintain clinical measurements within normal limits will improve Outcome: Progressing Goal: Will remain free from infection Outcome: Progressing Goal: Diagnostic test results will improve Outcome: Progressing Goal: Respiratory complications will improve Outcome: Progressing Goal: Cardiovascular complication will be avoided Outcome: Progressing   Problem: Activity: Goal: Risk for activity intolerance will decrease Outcome: Progressing   Problem: Nutrition: Goal: Adequate nutrition will be maintained Outcome: Progressing   Problem: Coping: Goal: Level of anxiety will decrease Outcome: Progressing   Problem: Elimination: Goal: Will not experience complications related to bowel motility Outcome: Progressing Goal: Will not experience complications related to urinary retention Outcome: Progressing   Problem: Pain Managment: Goal: General experience of comfort will improve Outcome: Progressing   Problem: Safety: Goal: Ability to remain free from injury will improve Outcome: Progressing   Problem: Skin Integrity: Goal: Risk for impaired skin integrity will decrease Outcome: Progressing   Problem: Education: Goal: Understanding of CV disease, CV risk reduction, and recovery process will improve Outcome: Progressing Goal: Individualized Educational Video(s) Outcome: Progressing   Problem: Activity: Goal: Ability to return to baseline activity level will improve Outcome: Progressing   Problem: Cardiovascular: Goal: Ability to achieve and maintain adequate cardiovascular perfusion  will improve Outcome: Progressing   Problem: Health Behavior/Discharge Planning: Goal: Ability to safely manage health-related needs after discharge will improve Outcome: Progressing   Problem: Education: Goal: Will demonstrate proper wound care and an understanding of methods to prevent future damage Outcome: Progressing Goal: Knowledge of disease or condition will improve Outcome: Progressing Goal: Knowledge of the prescribed therapeutic regimen will improve Outcome: Progressing Goal: Individualized Educational Video(s) Outcome: Progressing   Problem: Activity: Goal: Risk for activity intolerance will decrease Outcome: Progressing   Problem: Cardiac: Goal: Will achieve and/or maintain hemodynamic stability Outcome: Progressing   Problem: Clinical Measurements: Goal: Postoperative complications will be avoided or minimized Outcome: Progressing   Problem: Respiratory: Goal: Respiratory status will improve Outcome: Progressing   Problem: Skin Integrity: Goal: Wound healing without signs and symptoms of infection Outcome: Progressing Goal: Risk for impaired skin integrity will decrease Outcome: Progressing   Problem: Urinary Elimination: Goal: Ability to achieve and maintain adequate renal perfusion and functioning will improve Outcome: Progressing

## 2020-07-14 ENCOUNTER — Inpatient Hospital Stay (HOSPITAL_COMMUNITY): Payer: Medicare Other

## 2020-07-14 LAB — BASIC METABOLIC PANEL
Anion gap: 11 (ref 5–15)
BUN: 33 mg/dL — ABNORMAL HIGH (ref 8–23)
CO2: 28 mmol/L (ref 22–32)
Calcium: 8.1 mg/dL — ABNORMAL LOW (ref 8.9–10.3)
Chloride: 96 mmol/L — ABNORMAL LOW (ref 98–111)
Creatinine, Ser: 1.44 mg/dL — ABNORMAL HIGH (ref 0.61–1.24)
GFR, Estimated: 51 mL/min — ABNORMAL LOW (ref >60)
Glucose, Bld: 118 mg/dL — ABNORMAL HIGH (ref 70–99)
Potassium: 3.3 mmol/L — ABNORMAL LOW (ref 3.5–5.1)
Sodium: 135 mmol/L (ref 135–145)

## 2020-07-14 LAB — CBC
HCT: 26.3 % — ABNORMAL LOW (ref 39.0–52.0)
Hemoglobin: 8.7 g/dL — ABNORMAL LOW (ref 13.0–17.0)
MCH: 29.2 pg (ref 26.0–34.0)
MCHC: 33.1 g/dL (ref 30.0–36.0)
MCV: 88.3 fL (ref 80.0–100.0)
Platelets: 272 10*3/uL (ref 150–400)
RBC: 2.98 MIL/uL — ABNORMAL LOW (ref 4.22–5.81)
RDW: 15.6 % — ABNORMAL HIGH (ref 11.5–15.5)
WBC: 6.5 10*3/uL (ref 4.0–10.5)
nRBC: 0 % (ref 0.0–0.2)

## 2020-07-14 LAB — COOXEMETRY PANEL
Carboxyhemoglobin: 1.2 % (ref 0.5–1.5)
Methemoglobin: 1.1 % (ref 0.0–1.5)
O2 Saturation: 55.5 %
Total hemoglobin: 9.6 g/dL — ABNORMAL LOW (ref 12.0–16.0)

## 2020-07-14 LAB — GLUCOSE, CAPILLARY
Glucose-Capillary: 153 mg/dL — ABNORMAL HIGH (ref 70–99)
Glucose-Capillary: 197 mg/dL — ABNORMAL HIGH (ref 70–99)
Glucose-Capillary: 241 mg/dL — ABNORMAL HIGH (ref 70–99)
Glucose-Capillary: 129 mg/dL — ABNORMAL HIGH (ref 70–99)

## 2020-07-14 LAB — MAGNESIUM: Magnesium: 1.9 mg/dL (ref 1.7–2.4)

## 2020-07-14 MED ORDER — METOPROLOL TARTRATE 25 MG/10 ML ORAL SUSPENSION
25.0000 mg | Freq: Three times a day (TID) | ORAL | Status: DC
  Filled 2020-07-14 (×9): qty 10

## 2020-07-14 MED ORDER — AMIODARONE IV BOLUS ONLY 150 MG/100ML
150.0000 mg | Freq: Once | INTRAVENOUS | Status: AC
  Administered 2020-07-14: 150 mg via INTRAVENOUS
  Filled 2020-07-14: qty 100

## 2020-07-14 MED ORDER — POTASSIUM CHLORIDE CRYS ER 20 MEQ PO TBCR
20.0000 meq | EXTENDED_RELEASE_TABLET | Freq: Every day | ORAL | Status: DC
  Filled 2020-07-14: qty 1

## 2020-07-14 MED ORDER — MAGNESIUM SULFATE 2 GM/50ML IV SOLN
2.0000 g | Freq: Once | INTRAVENOUS | Status: AC
  Administered 2020-07-14: 2 g via INTRAVENOUS
  Filled 2020-07-14: qty 50

## 2020-07-14 MED ORDER — METOPROLOL TARTRATE 25 MG PO TABS
25.0000 mg | ORAL_TABLET | Freq: Three times a day (TID) | ORAL | Status: DC
  Administered 2020-07-14 – 2020-07-16 (×7): 25 mg via ORAL
  Filled 2020-07-14 (×7): qty 1

## 2020-07-14 MED ORDER — FUROSEMIDE 40 MG PO TABS
40.0000 mg | ORAL_TABLET | Freq: Two times a day (BID) | ORAL | Status: DC
  Administered 2020-07-14 (×2): 40 mg via ORAL
  Filled 2020-07-14 (×2): qty 1

## 2020-07-14 MED ORDER — POTASSIUM CHLORIDE CRYS ER 20 MEQ PO TBCR
30.0000 meq | EXTENDED_RELEASE_TABLET | Freq: Three times a day (TID) | ORAL | Status: AC
  Administered 2020-07-14 (×3): 30 meq via ORAL
  Filled 2020-07-14 (×3): qty 1

## 2020-07-14 NOTE — Progress Notes (Signed)
Pt's introducer IJ removed per MD order.  Pt tolerated well.  No complications to report at this time.  Pt will be on bedrest and monitored per protocol.

## 2020-07-14 NOTE — Progress Notes (Signed)
RN aware of the DC central line order, currently still using Introducer for medications

## 2020-07-14 NOTE — Progress Notes (Addendum)
[address]       [place] [address]             [phone]      5 Days Post-Op Procedure(s) (LRB): CORONARY ARTERY BYPASS GRAFTING (CABG) TIMES THREE , USING LEFT INTERNAL MAMMARY ARTERY AND RIGHT LEG GREATER SAPHENOUS VEIN HARVESTED ENDOSCOPICALLY (N/A) TRANSESOPHAGEAL ECHOCARDIOGRAM (TEE) (N/A) Subjective: Sitting up in bed finishing breakfast. Says he feels he is progressing. No new concerns.   On RA, amiodarone infusing through a right IJ cordis.   Objective: Vital signs in last 24 hours: Temp:  [98.3 F (36.8 C)-98.7 F (37.1 C)] 98.5 F (36.9 C) (12/25 0821) Pulse Rate:  [63-74] 72 (12/25 0821) Cardiac Rhythm: Normal sinus rhythm;Bundle branch block (12/25 0830) Resp:  [10-20] 18 (12/25 0821) BP: (102-145)/(49-99) 136/64 (12/25 0821) SpO2:  [93 %-100 %] 95 % (12/25 0821) Weight:  [85.4 kg] 85.4 kg (12/25 0451)     Intake/Output from previous day: 12/24 0701 - 12/25 0700 In: 1080.4 [P.O.:580; I.V.:500.4] Out: 3181 [JKKXF:8182; Chest Tube:90] Intake/Output this shift: Total I/O In: 240 [P.O.:240] Out: 200 [Urine:200]  General appearance: alert, cooperative and no distress Neurologic: intact Heart: irregularly irregular rhythm Lungs: clear to auscultation bilaterally and CXR shows clear lung fields, trace effusions.  Abdomen: soft and non-tender Extremities: Well perfused, minimal peripheral edema.  Wound: The sternotomy and RLE EVH incisions are intact and healing with no sign of complication.  Pacer wires remain in place  Lab Results: Recent Labs    07/13/20 0554 07/14/20 0111  WBC 8.9 6.5  HGB 8.8* 8.7*  HCT 27.3* 26.3*  PLT 240 272   BMET:  Recent Labs    07/13/20 0554 07/14/20 0111  NA 132* 135  K 4.2 3.3*  CL 100 96*  CO2 23 28  GLUCOSE 184* 118*  BUN 32* 33*  CREATININE 1.42* 1.44*  CALCIUM 7.8* 8.1*    PT/INR: No results for input(s): LABPROT, INR in the last 72 hours. ABG    Component Value Date/Time    PHART 7.329 (L) 07/09/2020 2300   HCO3 19.9 (L) 07/09/2020 2300   TCO2 21 (L) 07/09/2020 2300   ACIDBASEDEF 5.0 (H) 07/09/2020 2300   O2SAT 55.5 07/14/2020 0455   CBG (last 3)  Recent Labs    07/13/20 1605 07/13/20 2111 07/14/20 0616  GLUCAP 167* 169* 153*    Assessment/Plan: S/P Procedure(s) (LRB): CORONARY ARTERY BYPASS GRAFTING (CABG) TIMES THREE , USING LEFT INTERNAL MAMMARY ARTERY AND RIGHT LEG GREATER SAPHENOUS VEIN HARVESTED ENDOSCOPICALLY (N/A) TRANSESOPHAGEAL ECHOCARDIOGRAM (TEE) (N/A)  -POD5 CABG x 3 for 2VCAD, class III heart failure, Normal EF. Progressing well from surgery standpoint. Doing well with mobility, continue PT. Continue ASA, metoprolol, Crestor. Leave the pacer wires while titrating amiodarone and BB.   -Post-op atrial fibrillation-- currently on amiodarone infusion a 0.5mg /min and oral amiodarone 400mg  BID that was started yesterday. Having variable rate control.  Will give an additional IV bolus 150mg  this morning and d/c the infusion and the cordis. Need to correct the K+ and will also check Mg++ this morning and correct if needed. SBP has been in 130's to 140's past 24 hours so we can also advance his metoprolol dosing.   -Volume excess- Wt still ~15lbs above pre-op although he does not appear significantly volume overloaded on exam and he is on RA. Continue diuresis with oral Lasix.   -Chronic renal insufficiency- Creat stable despite brisk diuresis. Continue to monitor closely.  -Type 2 DM- reasonable  control on BID Levemir and SSI.   -Expected acute blood loss anemia- Hct stable, continue Trinsicon.     LOS: 10 days    Leary Roca, PA-C  07/14/2020   Chart reviewed, patient examined, agree with above. Feels well. Maintaining sinus on oral amio. Sleeve is out. Wt is still 15 lbs over preop. Continuing lasix and Kdur.

## 2020-07-15 LAB — BASIC METABOLIC PANEL
Anion gap: 11 (ref 5–15)
BUN: 34 mg/dL — ABNORMAL HIGH (ref 8–23)
CO2: 28 mmol/L (ref 22–32)
Calcium: 8.2 mg/dL — ABNORMAL LOW (ref 8.9–10.3)
Chloride: 95 mmol/L — ABNORMAL LOW (ref 98–111)
Creatinine, Ser: 1.64 mg/dL — ABNORMAL HIGH (ref 0.61–1.24)
GFR, Estimated: 43 mL/min — ABNORMAL LOW (ref >60)
Glucose, Bld: 127 mg/dL — ABNORMAL HIGH (ref 70–99)
Potassium: 3.7 mmol/L (ref 3.5–5.1)
Sodium: 134 mmol/L — ABNORMAL LOW (ref 135–145)

## 2020-07-15 LAB — GLUCOSE, CAPILLARY
Glucose-Capillary: 157 mg/dL — ABNORMAL HIGH (ref 70–99)
Glucose-Capillary: 263 mg/dL — ABNORMAL HIGH (ref 70–99)
Glucose-Capillary: 138 mg/dL — ABNORMAL HIGH (ref 70–99)
Glucose-Capillary: 222 mg/dL — ABNORMAL HIGH (ref 70–99)

## 2020-07-15 LAB — CBC
HCT: 27.3 % — ABNORMAL LOW (ref 39.0–52.0)
Hemoglobin: 9.5 g/dL — ABNORMAL LOW (ref 13.0–17.0)
MCH: 29.6 pg (ref 26.0–34.0)
MCHC: 34.8 g/dL (ref 30.0–36.0)
MCV: 85 fL (ref 80.0–100.0)
Platelets: 360 10*3/uL (ref 150–400)
RBC: 3.21 MIL/uL — ABNORMAL LOW (ref 4.22–5.81)
RDW: 15.5 % (ref 11.5–15.5)
WBC: 7.9 10*3/uL (ref 4.0–10.5)
nRBC: 0 % (ref 0.0–0.2)

## 2020-07-15 MED ORDER — POTASSIUM CHLORIDE CRYS ER 20 MEQ PO TBCR
20.0000 meq | EXTENDED_RELEASE_TABLET | Freq: Two times a day (BID) | ORAL | Status: AC
  Administered 2020-07-15 (×2): 20 meq via ORAL
  Filled 2020-07-15: qty 1

## 2020-07-15 MED ORDER — INSULIN ASPART 100 UNIT/ML ~~LOC~~ SOLN
0.0000 [IU] | Freq: Three times a day (TID) | SUBCUTANEOUS | Status: DC
  Administered 2020-07-15: 8 [IU] via SUBCUTANEOUS
  Administered 2020-07-16: 2 [IU] via SUBCUTANEOUS

## 2020-07-15 NOTE — Progress Notes (Addendum)
[address]       [place] [address]             [phone]      6 Days Post-Op Procedure(s) (LRB): CORONARY ARTERY BYPASS GRAFTING (CABG) TIMES THREE , USING LEFT INTERNAL MAMMARY ARTERY AND RIGHT LEG GREATER SAPHENOUS VEIN HARVESTED ENDOSCOPICALLY (N/A) TRANSESOPHAGEAL ECHOCARDIOGRAM (TEE) (N/A) Subjective: Up in the bedside chair, his wife is at the bedside.  Says he feels good today.  No new complaints or concerns.  Remains on room air, bowel movement yesterday Objective: Vital signs in last 24 hours: Temp:  [98 F (36.7 C)-99.3 F (37.4 C)] 98.8 F (37.1 C) (12/26 0913) Pulse Rate:  [67-79] 79 (12/26 0913) Cardiac Rhythm: Normal sinus rhythm (12/26 0737) Resp:  [16-23] 20 (12/26 0913) BP: (113-131)/(57-76) 126/61 (12/26 0913) SpO2:  [93 %-97 %] 97 % (12/26 0913) Weight:  [79.7 kg] 79.7 kg (12/26 0500)     Intake/Output from previous day: 12/25 0701 - 12/26 0700 In: 720 [P.O.:720] Out: 3670 [Urine:3670] Intake/Output this shift: No intake/output data recorded.  General appearance: alert, cooperative and no distress Neurologic: intact Heart: Regular rate and rhythm, monitor shows normal sinus rhythm since yesterday afternoon Lungs: clear to auscultation bilaterally  Abdomen: soft and non-tender Extremities: Well perfused, minimal peripheral edema.  Wound: The sternotomy and RLE EVH incisions are intact and healing with no sign of complication.  Pacer wires remain in place  Lab Results: Recent Labs    07/14/20 0111 07/15/20 0258  WBC 6.5 7.9  HGB 8.7* 9.5*  HCT 26.3* 27.3*  PLT 272 360   BMET:  Recent Labs    07/14/20 0111 07/15/20 0258  NA 135 134*  K 3.3* 3.7  CL 96* 95*  CO2 28 28  GLUCOSE 118* 127*  BUN 33* 34*  CREATININE 1.44* 1.64*  CALCIUM 8.1* 8.2*    PT/INR: No results for input(s): LABPROT, INR in the last 72 hours. ABG    Component Value Date/Time   PHART 7.329 (L) 07/09/2020 2300   HCO3 19.9 (L)  07/09/2020 2300   TCO2 21 (L) 07/09/2020 2300   ACIDBASEDEF 5.0 (H) 07/09/2020 2300   O2SAT 55.5 07/14/2020 0455   CBG (last 3)  Recent Labs    07/14/20 1706 07/14/20 2133 07/15/20 0613  GLUCAP 241* 129* 157*    Assessment/Plan: S/P Procedure(s) (LRB): CORONARY ARTERY BYPASS GRAFTING (CABG) TIMES THREE , USING LEFT INTERNAL MAMMARY ARTERY AND RIGHT LEG GREATER SAPHENOUS VEIN HARVESTED ENDOSCOPICALLY (N/A) TRANSESOPHAGEAL ECHOCARDIOGRAM (TEE) (N/A)  -POD6 CABG x 3 for 2VCAD, class III heart failure, Normal EF. Progressing well from surgery standpoint. Doing well with mobility, continue PT. Continue ASA, metoprolol, Crestor.  Will remove the pacing wires today.   -Post-op atrial fibrillation--back in sinus rhythm on oral amiodarone.  Magnesium and potassium supplement yesterday.  We'll give additional potassium today to keep K+ greater than 4.  -Volume excess-and net 3 L output yesterday.  Had a slight bump in his creatinine from 1.4-1.6.  We'll hold off on Lasix today but may need to return a daily dose tomorrow.  -Chronic renal insufficiency-slight increase in creatinine.  Holding Lasix today.  Recheck BMP tomorrow morning  -Type 2 DM- reasonable control on BID Levemir and SSI.   -Expected acute blood loss anemia- Hct stable, continue Trinsicon.   -Disposition-Mr. Orzechowski plans to return home.  His wife will be assisting with his recovery.  Expected to be ready for discharge Monday or Tuesday if progress  continues.    LOS: 11 days    Antony Odea, PA-C  07/15/2020    Chart reviewed, patient examined, agree with above. Diuresed well yesterday. Wt is still about 4 lbs over preop. Agree with holding lasix today and reassessing in am. Probably home tomorrow or Tuesday.

## 2020-07-15 NOTE — Progress Notes (Signed)
Mobility Specialist: Progress Note   07/15/20 1208  Mobility  Activity Ambulated to bathroom;Ambulated in hall  Level of Assistance Independent  Assistive Device None  Distance Ambulated (ft) 390 ft  Mobility Response Tolerated well  Mobility performed by Mobility specialist  $Mobility charge 1 Mobility   Pre-Mobility: 81 HR, 123/58 BP, 92% SpO2 During Mobility: 94 HR Post-Mobility: 90 HR, 125/69 BP, 93% SpO2  Pt asx during ambulation. Pt back to bed after ambulation with call bell and phone by his side.   [facility] Salif Tay Mobility Specialist

## 2020-07-15 NOTE — Progress Notes (Signed)
Pt's epicardial pacing wires removed per MD order.  No complication to report at this time.  Pt tolerated well.  Pt will be placed on bed rest and monitored per protocol.

## 2020-07-16 ENCOUNTER — Inpatient Hospital Stay (HOSPITAL_COMMUNITY): Payer: Medicare Other

## 2020-07-16 LAB — BASIC METABOLIC PANEL
Anion gap: 10 (ref 5–15)
BUN: 33 mg/dL — ABNORMAL HIGH (ref 8–23)
CO2: 27 mmol/L (ref 22–32)
Calcium: 8.4 mg/dL — ABNORMAL LOW (ref 8.9–10.3)
Chloride: 98 mmol/L (ref 98–111)
Creatinine, Ser: 1.66 mg/dL — ABNORMAL HIGH (ref 0.61–1.24)
GFR, Estimated: 43 mL/min — ABNORMAL LOW (ref >60)
Glucose, Bld: 174 mg/dL — ABNORMAL HIGH (ref 70–99)
Potassium: 4.2 mmol/L (ref 3.5–5.1)
Sodium: 135 mmol/L (ref 135–145)

## 2020-07-16 LAB — CBC
HCT: 26.4 % — ABNORMAL LOW (ref 39.0–52.0)
Hemoglobin: 9.2 g/dL — ABNORMAL LOW (ref 13.0–17.0)
MCH: 29.8 pg (ref 26.0–34.0)
MCHC: 34.8 g/dL (ref 30.0–36.0)
MCV: 85.4 fL (ref 80.0–100.0)
Platelets: 416 10*3/uL — ABNORMAL HIGH (ref 150–400)
RBC: 3.09 MIL/uL — ABNORMAL LOW (ref 4.22–5.81)
RDW: 15.7 % — ABNORMAL HIGH (ref 11.5–15.5)
WBC: 8.3 10*3/uL (ref 4.0–10.5)
nRBC: 0 % (ref 0.0–0.2)

## 2020-07-16 LAB — GLUCOSE, CAPILLARY: Glucose-Capillary: 138 mg/dL — ABNORMAL HIGH (ref 70–99)

## 2020-07-16 MED ORDER — AMIODARONE HCL 200 MG PO TABS: 200.0000 mg | ORAL_TABLET | Freq: Two times a day (BID) | ORAL | 1 refills | Status: DC

## 2020-07-16 MED ORDER — FE FUMARATE-B12-VIT C-FA-IFC PO CAPS: 1.0000 | ORAL_CAPSULE | Freq: Two times a day (BID) | ORAL | 1 refills | Status: DC

## 2020-07-16 MED ORDER — METOPROLOL TARTRATE 25 MG PO TABS: 25.0000 mg | ORAL_TABLET | Freq: Two times a day (BID) | ORAL | 1 refills | Status: DC

## 2020-07-16 MED ORDER — ROSUVASTATIN CALCIUM 20 MG PO TABS: 20.0000 mg | ORAL_TABLET | Freq: Every day | ORAL | 1 refills | Status: DC

## 2020-07-16 MED ORDER — TRAMADOL HCL 50 MG PO TABS: 50.0000 mg | ORAL_TABLET | Freq: Four times a day (QID) | ORAL | 0 refills | Status: DC | PRN

## 2020-07-16 MED ORDER — ASPIRIN 325 MG PO TBEC: 325.0000 mg | DELAYED_RELEASE_TABLET | Freq: Every day | ORAL | Status: DC

## 2020-07-16 NOTE — Progress Notes (Signed)
CARDIAC REHAB PHASE I   D/c education completed with pt and wife. Pt educated on importance of site care and monitoring incisions daily. Encouraged continued IS use, walks, and sternal precautions. Pt given in-the-tube sheet along with heart healthy and diabetic diets. Reviewed restrictions and exercise guidelines. Will refer to CRP II [place].    [id number] Rufina Falco, RN BSN 07/16/2020 9:28 AM

## 2020-07-16 NOTE — TOC Transition Note (Signed)
Transition of Care (TOC) - CM/SW Discharge Note Marvetta Gibbons RN, BSN Transitions of Care Unit 4E- RN Case Manager See Treatment Team for direct phone #    Patient Details  Name: Patrick Miranda MRN: [id number] Date of Birth: Jun 22, 1946  Transition of Care [place]) CM/SW Contact:  Dawayne Patricia, RN Phone Number: 07/16/2020, 12:29 PM   Clinical Narrative:    Pt stable for transition home today, order placed for HHPT- however on re-eval by PT today- pt no longer in need of [place] f/u for PT needs per note. Pt returning home with wife, no DME needs. No HH needs offered, pt to f/u with outpt cardiac rehab.    Final next level of care: Home/Self Care Barriers to Discharge: No Barriers Identified   Patient Goals and CMS Choice Patient states their goals for this hospitalization and ongoing recovery are:: return home   Choice offered to / list presented to : NA  Discharge Placement               home        Discharge Plan and Services   Discharge Planning Services: CM Consult Post Acute Care Choice: Home Health          DME Arranged: N/A DME Agency: NA       HH Arranged: PT,NA HH Agency: NA        Social Determinants of Health (SDOH) Interventions     Readmission Risk Interventions No flowsheet data found.

## 2020-07-16 NOTE — Care Management Important Message (Signed)
Important Message  Patient Details  Name: Patrick Miranda MRN: [id number] Date of Birth: [birth year]   Medicare Important Message Given:  Yes     Renie Ora 07/16/2020, 10:05 AM

## 2020-07-16 NOTE — Progress Notes (Signed)
Pt discharging home with wife. Pt and pt's wife received discharge instructions and all questions were answered. IVs and telemetry box removed.

## 2020-07-16 NOTE — Progress Notes (Addendum)
[address]       [place] [address]             [phone]      7 Days Post-Op Procedure(s) (LRB): CORONARY ARTERY BYPASS GRAFTING (CABG) TIMES THREE , USING LEFT INTERNAL MAMMARY ARTERY AND RIGHT LEG GREATER SAPHENOUS VEIN HARVESTED ENDOSCOPICALLY (N/A) TRANSESOPHAGEAL ECHOCARDIOGRAM (TEE) (N/A) Subjective: Feels fine, no specific c/o  Objective: Vital signs in last 24 hours: Temp:  [98.5 F (36.9 C)-99.4 F (37.4 C)] 98.5 F (36.9 C) (12/27 0345) Pulse Rate:  [66-83] 72 (12/27 0345) Cardiac Rhythm: Normal sinus rhythm (12/26 1937) Resp:  [14-24] 20 (12/27 0345) BP: (105-128)/(58-69) 128/60 (12/27 0345) SpO2:  [92 %-98 %] 94 % (12/27 0345) Weight:  [78.4 kg] 78.4 kg (12/27 0500)  Hemodynamic parameters for last 24 hours:    Intake/Output from previous day: 12/26 0701 - 12/27 0700 In: 670 [P.O.:670] Out: 1500 [Urine:1500] Intake/Output this shift: Total I/O In: 120 [P.O.:120] Out: 1100 [Urine:1100]  General appearance: alert, cooperative and no distress Heart: regular rate and rhythm Lungs: clear to auscultation bilaterally Abdomen: benign Extremities: no edema Wound: incis healing well  Lab Results: Recent Labs    07/15/20 0258 07/16/20 0055  WBC 7.9 8.3  HGB 9.5* 9.2*  HCT 27.3* 26.4*  PLT 360 416*   BMET:  Recent Labs    07/15/20 0258 07/16/20 0055  NA 134* 135  K 3.7 4.2  CL 95* 98  CO2 28 27  GLUCOSE 127* 174*  BUN 34* 33*  CREATININE 1.64* 1.66*  CALCIUM 8.2* 8.4*    PT/INR: No results for input(s): LABPROT, INR in the last 72 hours. ABG    Component Value Date/Time   PHART 7.329 (L) 07/09/2020 2300   HCO3 19.9 (L) 07/09/2020 2300   TCO2 21 (L) 07/09/2020 2300   ACIDBASEDEF 5.0 (H) 07/09/2020 2300   O2SAT 55.5 07/14/2020 0455   CBG (last 3)  Recent Labs    07/15/20 1619 07/15/20 2133 07/16/20 0542  GLUCAP 138* 222* 138*    Meds Scheduled Meds: . amiodarone  400 mg Oral BID  . aspirin EC  325 mg  Oral Daily   Or  . aspirin  324 mg Per Tube Daily  . bisacodyl  10 mg Oral Daily   Or  . bisacodyl  10 mg Rectal Daily  . Chlorhexidine Gluconate Cloth  6 each Topical Daily  . docusate sodium  200 mg Oral Daily  . ferrous fumarate-b12-vitamic C-folic acid  1 capsule Oral BID PC  . insulin aspart  0-24 Units Subcutaneous TID AC & HS  . insulin detemir  18 Units Subcutaneous BID  . mouth rinse  15 mL Mouth Rinse BID  . metoprolol tartrate  25 mg Oral TID   Or  . metoprolol tartrate  25 mg Per Tube TID  . pantoprazole  40 mg Oral BID  . rosuvastatin  20 mg Oral Daily  . sodium chloride flush  10-40 mL Intracatheter Q12H  . sodium chloride flush  3 mL Intravenous Q12H   Continuous Infusions: . sodium chloride Stopped (07/10/20 1054)  . sodium chloride    . sodium chloride 10 mL/hr (07/09/20 1528)   PRN Meds:.sodium chloride, benzonatate, dextrose, metoprolol tartrate, ondansetron (ZOFRAN) IV, oxyCODONE, sodium chloride flush, sodium chloride flush, traMADol  Xrays No results found.  Assessment/Plan: S/P Procedure(s) (LRB): CORONARY ARTERY BYPASS GRAFTING (CABG) TIMES THREE , USING LEFT INTERNAL MAMMARY ARTERY AND RIGHT LEG GREATER SAPHENOUS VEIN HARVESTED ENDOSCOPICALLY (  N/A) TRANSESOPHAGEAL ECHOCARDIOGRAM (TEE) (N/A)  1 Tmax 99.4, VSS, sinus with some PVC;'s 2 sats good on RA 3 CBG- variable control, some readings over 200- poor control preop- will need to restart home meds and have close outpatient monitoring 4 creat stable at 1.66, above baseline but stable , lasix held yesterday, good UOP- stop lasix completely 5 ABLA is relatively stable 6 CXR without significant findings 7 appears stable for d/c today   LOS: 12 days    Rowe Clack PA-C Pager [phone] 07/16/2020 Patient seen and examined, agree with above Dc home today  Salvatore Decent. Dorris Fetch, MD [facility] [phone]

## 2020-07-16 NOTE — Progress Notes (Addendum)
Physical Therapy Treatment Patient Details Name: Patrick Miranda MRN: [id number] DOB: [birth year] Today's Date: 07/16/2020    History of Present Illness 74 y.o. male with history of CAD, CVA, OSA, DVT/PE, HTN, DM 2, PAF who underwent outpatient Lexiscan myoview which was abnormal and set up for outpatient cardiac cath found to have multivessel CAD.  CABG x 3 on12/20.    PT Comments    Pt supine in bed on arrival this session.  Pt educated on sternal precautions and dressing to maintain sternal precautions this session.  Pt progressed to gt without device this session.  Wife present to observe.  Plan to follow up with phase II Cardiac Rehab.  No PT follow up needed based on progress this am.    Follow Up Recommendations  No PT follow up (follow up withy CRP II)     Equipment Recommendations  None recommended by PT    Recommendations for Other Services       Precautions / Restrictions Precautions Precautions: Fall;Sternal Precaution Booklet Issued: Yes (comment) Precaution Comments: sternal precautions. Restrictions Weight Bearing Restrictions: Yes Other Position/Activity Restrictions: sternal precautions    Mobility  Bed Mobility Overal bed mobility: Needs Assistance Bed Mobility: Rolling;Sidelying to Sit Rolling: Min guard Sidelying to sit: Min assist       General bed mobility comments: Min assistance to elevate trunk into a seated position.  Transfers Overall transfer level: Needs assistance Equipment used: Rolling walker (2 wheeled) Transfers: Sit to/from Stand Sit to Stand: Supervision         General transfer comment: Cues for hand placement to push on knees to power into standing.  Ambulation/Gait Ambulation/Gait assistance: Supervision Gait Distance (Feet): 400 Feet Assistive device: None Gait Pattern/deviations: Step-through pattern;Decreased stride length;Trunk flexed Gait velocity: slowed   General Gait Details: Cues for reciprocal armswing and  upper trunk control.  Pt with mild drifiting but no overt LOB.   Stairs             Wheelchair Mobility    Modified Rankin (Stroke Patients Only)       Balance Overall balance assessment: Needs assistance Sitting-balance support: No upper extremity supported;Feet supported Sitting balance-Leahy Scale: Fair       Standing balance-Leahy Scale: Poor                              Cognition Arousal/Alertness: Awake/alert Behavior During Therapy: WFL for tasks assessed/performed Overall Cognitive Status: Within Functional Limits for tasks assessed                                        Exercises Other Exercises Other Exercises: IS x 10 reps.  [phone] quality. Other Exercises: cervical flexion and extension x 10 reps.  Cervival rotation to R and L  x 10 reps. Other Exercises: Chin tucks x 10 reps.    General Comments        Pertinent Vitals/Pain Pain Assessment: 0-10 Pain Score: 5  Pain Location: incision with movement Pain Descriptors / Indicators: Aching;Grimacing;Guarding Pain Intervention(s): Monitored during session;Repositioned    Home Living                      Prior Function            PT Goals (current goals can now be found in the care plan section) Acute  Rehab PT Goals Patient Stated Goal: to go home Potential to Achieve Goals: Good Progress towards PT goals: Progressing toward goals    Frequency    Min 3X/week      PT Plan Current plan remains appropriate    Co-evaluation              AM-PAC PT "6 Clicks" Mobility   Outcome Measure  Help needed turning from your back to your side while in a flat bed without using bedrails?: A Little Help needed moving from lying on your back to sitting on the side of a flat bed without using bedrails?: A Little Help needed moving to and from a bed to a chair (including a wheelchair)?: A Little Help needed standing up from a chair using your arms  (e.g., wheelchair or bedside chair)?: A Little Help needed to walk in hospital room?: A Little Help needed climbing 3-5 steps with a railing? : A Little 6 Click Score: 18    End of Session Equipment Utilized During Treatment: Gait belt;Oxygen Activity Tolerance: Patient tolerated treatment well Patient left: in bed;with call bell/phone within reach;with family/visitor present Nurse Communication: Mobility status PT Visit Diagnosis: Unsteadiness on feet (R26.81);Muscle weakness (generalized) (M62.81)     Time: [id number] PT Time Calculation (min) (ACUTE ONLY): 23 min  Charges:  $Gait Training: 8-22 mins $Therapeutic Exercise: 8-22 mins                     Erasmo Leventhal , PTA Acute Rehabilitation Services Pager [phone] Office [phone]     Max Romano Eli Hose 07/16/2020, 12:06 PM

## 2020-07-25 ENCOUNTER — Ambulatory Visit (INDEPENDENT_AMBULATORY_CARE_PROVIDER_SITE_OTHER): Payer: Self-pay | Admitting: Cardiothoracic Surgery

## 2020-07-25 ENCOUNTER — Other Ambulatory Visit: Payer: Self-pay

## 2020-07-25 ENCOUNTER — Ambulatory Visit
Admission: RE | Admit: 2020-07-25 | Discharge: 2020-07-25 | Disposition: A | Discharge status: Home / Self Care | Payer: Medicare HMO | Source: Ambulatory Visit | Attending: Cardiothoracic Surgery | Admitting: Cardiothoracic Surgery

## 2020-07-25 ENCOUNTER — Other Ambulatory Visit: Payer: Self-pay | Admitting: Cardiothoracic Surgery

## 2020-07-25 ENCOUNTER — Other Ambulatory Visit: Payer: Self-pay | Admitting: *Deleted

## 2020-07-25 ENCOUNTER — Encounter: Payer: Self-pay | Admitting: Cardiothoracic Surgery

## 2020-07-25 VITALS — BP 112/74 | HR 85 | Resp 20 | Ht 71.0 in | Wt 171.0 lb

## 2020-07-25 DIAGNOSIS — Z951 Presence of aortocoronary bypass graft: Secondary | ICD-10-CM

## 2020-07-25 DIAGNOSIS — Z09 Encounter for follow-up examination after completed treatment for conditions other than malignant neoplasm: Secondary | ICD-10-CM | POA: Diagnosis not present

## 2020-07-25 DIAGNOSIS — M5134 Other intervertebral disc degeneration, thoracic region: Secondary | ICD-10-CM | POA: Diagnosis not present

## 2020-07-25 DIAGNOSIS — I517 Cardiomegaly: Secondary | ICD-10-CM | POA: Diagnosis not present

## 2020-07-25 DIAGNOSIS — J90 Pleural effusion, not elsewhere classified: Secondary | ICD-10-CM | POA: Diagnosis not present

## 2020-07-25 NOTE — Progress Notes (Signed)
Pt's wife Rivka Barbara contacted the office concerned about Mr. Looper growing weak and pale in color. Wife states pt is unable to ambulate without becoming very short of breath. Pt's blood sugar was reported to be 180 today. Wife states his BP has not been monitored at home. Denies fever. Wife states pt has been lethargic and appears to be breathing fast. Per Dr. Donata Clay, he will see pt today in the office with a CXR prior to exam. [name] made aware and acknowledges receipt.

## 2020-07-25 NOTE — Addendum Note (Signed)
Addended by: Burgess Estelle MD, Ayslin Kundert on: 07/25/2020 04:18 PM   Modules accepted: Orders

## 2020-07-25 NOTE — Progress Notes (Signed)
PCP is Geoffry Paradise, MD Referring Provider is Lennette Bihari, MD  Chief Complaint  Patient presents with  . Routine Post Op    S/p CABG 12/20, cxr today    HPI: Patient presents for evaluation after calling in today that he felt weak short of breath pale and not progressing.  He had urgent multivessel CABG on December 20.  He has difficult to control diabetes, history of stroke, history of COVID-19 pneumonitis requiring hospitalization summer 2021 with pulmonary embolus.  Patient denies fever, chest pain, ankle edema.  No difficulty with the surgical incisions which are healing. Complains of being "sick", poor appetite, fatigue with ambulation and some intermittent nausea.  The patient had postop atrial fibrillation which converted to sinus rhythm prior to discharge.  He still is on amiodarone 200 twice daily.  He was discharged home on oral iron for a hemoglobin of 8.5.  Chest x-ray performed today shows clear lung fields.  No pleural effusion.  Sternal wires intact.  Cardiac silhouette stable. Past Medical History:  Diagnosis Date  . Arthritis   . 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 Cancer (HCC)    Skin  . Cerebral artery disease   . Coronary artery disease   . COVID-07 Dec 2019  . Depression   . Essential hypertension   . Hernia, umbilical   . HOH (hard of hearing)   . Hyperlipidemia   . OSA on CPAP   . Pulmonary emboli [facility])    May 2021  . Type 2 diabetes mellitus (HCC)     Past Surgical History:  Procedure Laterality Date  . COLONOSCOPY W/ POLYPECTOMY    . CORONARY ARTERY BYPASS GRAFT N/A 07/09/2020   Procedure: CORONARY ARTERY BYPASS GRAFTING (CABG) TIMES THREE , USING LEFT INTERNAL MAMMARY ARTERY AND RIGHT LEG GREATER SAPHENOUS VEIN HARVESTED ENDOSCOPICALLY;  Surgeon: Kerin Perna, MD;  Location: [facility] OR;  Service: Open Heart Surgery;  Laterality: N/A;  . LEFT HEART CATH AND CORONARY ANGIOGRAPHY N/A 07/04/2020   Procedure: LEFT HEART CATH AND CORONARY  ANGIOGRAPHY;  Surgeon: Lennette Bihari, MD;  Location: [facility] INVASIVE CV LAB;  Service: Cardiovascular;  Laterality: N/A;  . TEE WITHOUT CARDIOVERSION N/A 07/09/2020   Procedure: TRANSESOPHAGEAL ECHOCARDIOGRAM (TEE);  Surgeon: Donata Clay, Theron Arista, MD;  Location: [facility] OR;  Service: Open Heart Surgery;  Laterality: N/A;  . TOTAL KNEE ARTHROPLASTY Left 04/22/2013   Procedure: TOTAL KNEE ARTHROPLASTY- left;  Surgeon: Verlee Rossetti, MD;  Location: [facility] OR;  Service: Orthopedics;  Laterality: Left;  with femoral block  . TOTAL KNEE ARTHROPLASTY Right 01/06/2014   Procedure: RIGHT TOTAL KNEE ARTHROPLASTY;  Surgeon: Verlee Rossetti, MD;  Location: [facility] OR;  Service: Orthopedics;  Laterality: Right;    Family History  Problem Relation Age of Onset  . CAD Brother        Premature    Social History Social History   Tobacco Use  . Smoking status: Never Smoker  . Smokeless tobacco: Never Used  Substance Use Topics  . Alcohol use: No  . Drug use: No    Current Outpatient Medications  Medication Sig Dispense Refill  . amiodarone (PACERONE) 200 MG tablet Take 1 tablet (200 mg total) by mouth 2 (two) times daily. 60 tablet 1  . aspirin EC 325 MG EC tablet Take 1 tablet (325 mg total) by mouth daily.    . BD PEN NEEDLE NANO U/F 32G X 4 MM MISC 1 each by Other route daily.     Marland Kitchen  Empagliflozin-metFORMIN HCl 11-998 MG TABS Take 1 tablet by mouth 2 (two) times daily.    . fenofibrate 160 MG tablet Take 160 mg by mouth daily.    . ferrous [id number] C-folic acid (TRINSICON / FOLTRIN) capsule Take 1 capsule by mouth 2 (two) times daily after a meal. 60 capsule 1  . HUMALOG KWIKPEN 100 UNIT/ML KwikPen Inject 10 Units into the skin with breakfast, with lunch, and with evening meal.     . methylphenidate (RITALIN) 5 MG tablet Take 5 mg by mouth once a week. On Sunday mornings    . metoprolol tartrate (LOPRESSOR) 25 MG tablet Take 1 tablet (25 mg total) by mouth 2 (two) times daily. 60 tablet 1  . ONETOUCH  VERIO test strip 1 each by Other route in the morning, at noon, and at bedtime.   3  . rosuvastatin (CRESTOR) 20 MG tablet Take 1 tablet (20 mg total) by mouth daily. 30 tablet 1  . TOUJEO SOLOSTAR 300 UNIT/ML SOPN Inject 30 Units into the skin daily.     . traMADol (ULTRAM) 50 MG tablet Take 1 tablet (50 mg total) by mouth every 6 (six) hours as needed for moderate pain. 28 tablet 0   No current facility-administered medications for this visit.    Allergies  Allergen Reactions  . Lipitor [Atorvastatin] Other (See Comments)    Muscle pain    Review of Systems  Complaints as noted above.  BP 112/74 (BP Location: Left Arm, Patient Position: Sitting)   Pulse 85   Resp 20   Ht 5\' 11"  (1.803 m)   Wt 171 lb (77.6 kg)   SpO2 97% Comment: RA with mask on  BMI 23.85 kg/m  Physical Exam      Exam    General- alert and comfortable.  Surgical incisions healing appropriately.    Neck- no JVD, no cervical adenopathy palpable, no carotid bruit   Lungs- clear without rales, wheezes   Cor- regular rate and rhythm, no murmur , gallop   Abdomen- soft, non-tender   Extremities - warm, non-tender, minimal edema   Neuro- oriented, appropriate, no focal weakness   Diagnostic Tests: Chest x-ray image personally reviewed with results noted above  Impression: Plan Patient recovery after CABG has been suboptimal, probable multifactorial.  However I feel that amiodarone side effects have a significant role in how he is feeling.  Since he is maintaining sinus rhythm now for almost 2 weeks we will stop the amiodarone.  He will continue his other medications.  We will check a CBC to make sure his hemoglobin does not continue to drop.  He will continue his other medications and return for a prescheduled office visit in about 2 weeks.    Len Childs, MD [facility] [phone]

## 2020-07-26 LAB — CBC WITH DIFFERENTIAL/PLATELET
Absolute Monocytes: 611 cells/uL (ref 200–950)
Basophils Absolute: 43 cells/uL (ref 0–200)
Basophils Relative: 0.5 %
Eosinophils Absolute: 26 cells/uL (ref 15–500)
Eosinophils Relative: 0.3 %
HCT: 30.2 % — ABNORMAL LOW (ref 38.5–50.0)
Hemoglobin: 9.2 g/dL — ABNORMAL LOW (ref 13.2–17.1)
Lymphs Abs: 1264 cells/uL (ref 850–3900)
MCH: 28.8 pg (ref 27.0–33.0)
MCHC: 30.5 g/dL — ABNORMAL LOW (ref 32.0–36.0)
MCV: 94.7 fL (ref 80.0–100.0)
MPV: 11.9 fL (ref 7.5–12.5)
Monocytes Relative: 7.1 %
Neutro Abs: 6656 cells/uL (ref 1500–7800)
Neutrophils Relative %: 77.4 %
Platelets: 1004 10*3/uL — ABNORMAL HIGH (ref 140–400)
RBC: 3.19 10*6/uL — ABNORMAL LOW (ref 4.20–5.80)
RDW: 15.6 % — ABNORMAL HIGH (ref 11.0–15.0)
Total Lymphocyte: 14.7 %
WBC: 8.6 10*3/uL (ref 3.8–10.8)

## 2020-08-01 ENCOUNTER — Ambulatory Visit: Payer: Medicare Other | Admitting: Cardiothoracic Surgery

## 2020-08-01 ENCOUNTER — Encounter: Payer: Self-pay | Admitting: Cardiothoracic Surgery

## 2020-08-01 ENCOUNTER — Other Ambulatory Visit: Payer: Self-pay

## 2020-08-01 ENCOUNTER — Ambulatory Visit (INDEPENDENT_AMBULATORY_CARE_PROVIDER_SITE_OTHER): Payer: Self-pay | Admitting: Cardiothoracic Surgery

## 2020-08-01 VITALS — BP 127/77 | HR 97 | Resp 20 | Ht 71.0 in | Wt 170.0 lb

## 2020-08-01 DIAGNOSIS — Z951 Presence of aortocoronary bypass graft: Secondary | ICD-10-CM

## 2020-08-01 NOTE — Progress Notes (Signed)
PCP is Burnard Bunting, MD Referring Provider is Troy Sine, MD  Chief Complaint  Patient presents with  . Routine Post Op    S/p CABG 07/09/20, cxr 07/25/20    HPI: 1 month postop follow-up after urgent CABG x4 for severe three-vessel diabetic disease with CHF and moderate LV dysfunction.  The patient had postoperative atrial fibrillation which converted to sinus rhythm with amiodarone.  However he reported to the office after being home for about a week with malaise weakness nausea and fatigue.  He had maintained sinus rhythm up to that point and we weaned him off the amiodarone and the symptoms all improved.  He is currently walking daily.  He is ready to increase his exercise capacity.  He denies recurrent symptoms of angina or CHF.  Surgical incision is healing well.  There is no edema.  He is not needing pain medication.  His postop chest x-ray at the first office visit was clear.  His blood sugars have been less than 200.   Past Medical History:  Diagnosis Date  . Arthritis   . 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 Cancer (HCC)    Skin  . Cerebral artery disease   . Coronary artery disease   . COVID-07 Dec 2019  . Depression   . Essential hypertension   . Hernia, umbilical   . HOH (hard of hearing)   . Hyperlipidemia   . OSA on CPAP   . Pulmonary emboli [facility])    May 2021  . Type 2 diabetes mellitus ([place])     Past Surgical History:  Procedure Laterality Date  . COLONOSCOPY W/ POLYPECTOMY    . CORONARY ARTERY BYPASS GRAFT N/A 07/09/2020   Procedure: CORONARY ARTERY BYPASS GRAFTING (CABG) TIMES THREE , USING LEFT INTERNAL MAMMARY ARTERY AND RIGHT LEG GREATER SAPHENOUS VEIN HARVESTED ENDOSCOPICALLY;  Surgeon: Ivin Poot, MD;  Location: [place];  Service: Open Heart Surgery;  Laterality: N/A;  . LEFT HEART CATH AND CORONARY ANGIOGRAPHY N/A 07/04/2020   Procedure: LEFT HEART CATH AND CORONARY ANGIOGRAPHY;  Surgeon: Troy Sine, MD;  Location: [place] [facility]  [facility];  Service: Cardiovascular;  Laterality: N/A;  . TEE WITHOUT CARDIOVERSION N/A 07/09/2020   Procedure: TRANSESOPHAGEAL ECHOCARDIOGRAM (TEE);  Surgeon: Prescott Gum, Collier Salina, MD;  Location: [place];  Service: Open Heart Surgery;  Laterality: N/A;  . TOTAL KNEE ARTHROPLASTY Left 04/22/2013   Procedure: TOTAL KNEE ARTHROPLASTY- left;  Surgeon: Augustin Schooling, MD;  Location: [place];  Service: Orthopedics;  Laterality: Left;  with femoral block  . TOTAL KNEE ARTHROPLASTY Right 01/06/2014   Procedure: RIGHT TOTAL KNEE ARTHROPLASTY;  Surgeon: Augustin Schooling, MD;  Location: [place];  Service: Orthopedics;  Laterality: Right;    Family History  Problem Relation Age of Onset  . CAD Brother        Premature    Social History Social History   Tobacco Use  . Smoking status: Never Smoker  . Smokeless tobacco: Never Used  Substance Use Topics  . Alcohol use: No  . Drug use: No    Current Outpatient Medications  Medication Sig Dispense Refill  . aspirin EC 325 MG EC tablet Take 1 tablet (325 mg total) by mouth daily.    . BD PEN NEEDLE NANO U/F 32G X 4 MM MISC 1 each by Other route daily.     . Empagliflozin-metFORMIN HCl 11-998 MG TABS Take 1 tablet by mouth 2 (two) times daily.    Marland Kitchen  fenofibrate 160 MG tablet Take 160 mg by mouth daily.    . ferrous [id number] C-folic acid (TRINSICON / FOLTRIN) capsule Take 1 capsule by mouth 2 (two) times daily after a meal. 60 capsule 1  . HUMALOG KWIKPEN 100 UNIT/ML KwikPen Inject 10 Units into the skin with breakfast, with lunch, and with evening meal.     . methylphenidate (RITALIN) 5 MG tablet Take 5 mg by mouth once a week. On Sunday mornings    . metoprolol tartrate (LOPRESSOR) 25 MG tablet Take 1 tablet (25 mg total) by mouth 2 (two) times daily. 60 tablet 1  . ONETOUCH VERIO test strip 1 each by Other route in the morning, at noon, and at bedtime.   3  . rosuvastatin (CRESTOR) 20 MG tablet Take 1 tablet (20 mg total) by mouth daily. 30 tablet 1  .  TOUJEO SOLOSTAR 300 UNIT/ML SOPN Inject 30 Units into the skin daily.     . traMADol (ULTRAM) 50 MG tablet Take 1 tablet (50 mg total) by mouth every 6 (six) hours as needed for moderate pain. 28 tablet 0   No current facility-administered medications for this visit.    Allergies  Allergen Reactions  . Lipitor [Atorvastatin] Other (See Comments)    Muscle pain    Review of Systems  Improved appetite and overall strength No palpitations of A. fib  BP 127/77 (BP Location: Left Arm, Patient Position: Sitting)   Pulse 97   Resp 20   Ht 5\' 11"  (1.803 m)   Wt 170 lb (77.1 kg)   SpO2 94% Comment: RA with mask on  BMI 23.71 kg/m  Physical Exam      Exam    General- alert and comfortable.  Sternal incision well-healed.    Neck- no JVD, no cervical adenopathy palpable, no carotid bruit   Lungs- clear without rales, wheezes   Cor- regular rate and rhythm, no murmur , gallop   Abdomen- soft, non-tender   Extremities - warm, non-tender, minimal edema   Neuro- oriented, appropriate, no focal weakness   Diagnostic Tests: Last chest x-ray in late December showed clear lung fields no pleural effusion  Impression: Patient making good progress recovering after multivessel CABG for severe diabetic disease and moderate LV dysfunction.  He will continue his current medications.  He will be able to start driving and lifting up to 10 pounds.  He understands he needs to maintain a diabetic and heart healthy diet.  He has an appointment with his primary cardiologist Dr. Domenic Polite later this month.    Plan: He will return for final postop visit in 6 weeks for review of progress and to discuss lifting all restrictions on activities.   Len Childs, MD [facility] [phone]

## 2020-08-02 ENCOUNTER — Other Ambulatory Visit: Payer: Self-pay | Admitting: *Deleted

## 2020-08-03 ENCOUNTER — Other Ambulatory Visit: Payer: Self-pay

## 2020-08-03 ENCOUNTER — Emergency Department (HOSPITAL_COMMUNITY): Payer: Medicare HMO

## 2020-08-03 ENCOUNTER — Inpatient Hospital Stay (HOSPITAL_COMMUNITY)
Admission: EM | Admit: 2020-08-03 | Discharge: 2020-08-10 | DRG: 392 | Disposition: A | Discharge status: Home Health | Payer: Medicare HMO | Attending: Internal Medicine | Admitting: Internal Medicine

## 2020-08-03 ENCOUNTER — Encounter (HOSPITAL_COMMUNITY): Payer: Self-pay

## 2020-08-03 DIAGNOSIS — Z8616 Personal history of COVID-19: Secondary | ICD-10-CM | POA: Diagnosis not present

## 2020-08-03 DIAGNOSIS — Z7982 Long term (current) use of aspirin: Secondary | ICD-10-CM

## 2020-08-03 DIAGNOSIS — K567 Ileus, unspecified: Secondary | ICD-10-CM | POA: Diagnosis not present

## 2020-08-03 DIAGNOSIS — K8020 Calculus of gallbladder without cholecystitis without obstruction: Secondary | ICD-10-CM | POA: Diagnosis not present

## 2020-08-03 DIAGNOSIS — I252 Old myocardial infarction: Secondary | ICD-10-CM

## 2020-08-03 DIAGNOSIS — R609 Edema, unspecified: Secondary | ICD-10-CM | POA: Diagnosis not present

## 2020-08-03 DIAGNOSIS — E1169 Type 2 diabetes mellitus with other specified complication: Secondary | ICD-10-CM | POA: Diagnosis not present

## 2020-08-03 DIAGNOSIS — R1033 Periumbilical pain: Secondary | ICD-10-CM

## 2020-08-03 DIAGNOSIS — N1831 Chronic kidney disease, stage 3a: Secondary | ICD-10-CM | POA: Diagnosis present

## 2020-08-03 DIAGNOSIS — A059 Bacterial foodborne intoxication, unspecified: Secondary | ICD-10-CM | POA: Diagnosis not present

## 2020-08-03 DIAGNOSIS — D75839 Thrombocytosis, unspecified: Secondary | ICD-10-CM | POA: Diagnosis present

## 2020-08-03 DIAGNOSIS — I700 Atherosclerosis of aorta: Secondary | ICD-10-CM | POA: Diagnosis present

## 2020-08-03 DIAGNOSIS — E782 Mixed hyperlipidemia: Secondary | ICD-10-CM

## 2020-08-03 DIAGNOSIS — Z7984 Long term (current) use of oral hypoglycemic drugs: Secondary | ICD-10-CM

## 2020-08-03 DIAGNOSIS — Z96653 Presence of artificial knee joint, bilateral: Secondary | ICD-10-CM | POA: Diagnosis present

## 2020-08-03 DIAGNOSIS — I253 Aneurysm of heart: Secondary | ICD-10-CM | POA: Diagnosis not present

## 2020-08-03 DIAGNOSIS — Z20822 Contact with and (suspected) exposure to covid-19: Secondary | ICD-10-CM | POA: Diagnosis not present

## 2020-08-03 DIAGNOSIS — E785 Hyperlipidemia, unspecified: Secondary | ICD-10-CM | POA: Diagnosis present

## 2020-08-03 DIAGNOSIS — Z794 Long term (current) use of insulin: Secondary | ICD-10-CM

## 2020-08-03 DIAGNOSIS — E1165 Type 2 diabetes mellitus with hyperglycemia: Secondary | ICD-10-CM | POA: Diagnosis present

## 2020-08-03 DIAGNOSIS — I255 Ischemic cardiomyopathy: Secondary | ICD-10-CM | POA: Diagnosis present

## 2020-08-03 DIAGNOSIS — E1122 Type 2 diabetes mellitus with diabetic chronic kidney disease: Secondary | ICD-10-CM | POA: Diagnosis present

## 2020-08-03 DIAGNOSIS — I2510 Atherosclerotic heart disease of native coronary artery without angina pectoris: Secondary | ICD-10-CM | POA: Diagnosis present

## 2020-08-03 DIAGNOSIS — I513 Intracardiac thrombosis, not elsewhere classified: Secondary | ICD-10-CM | POA: Diagnosis present

## 2020-08-03 DIAGNOSIS — Z951 Presence of aortocoronary bypass graft: Secondary | ICD-10-CM

## 2020-08-03 DIAGNOSIS — K429 Umbilical hernia without obstruction or gangrene: Secondary | ICD-10-CM | POA: Diagnosis not present

## 2020-08-03 DIAGNOSIS — R339 Retention of urine, unspecified: Secondary | ICD-10-CM | POA: Diagnosis not present

## 2020-08-03 DIAGNOSIS — R1084 Generalized abdominal pain: Secondary | ICD-10-CM | POA: Diagnosis not present

## 2020-08-03 DIAGNOSIS — K439 Ventral hernia without obstruction or gangrene: Secondary | ICD-10-CM | POA: Diagnosis not present

## 2020-08-03 DIAGNOSIS — Z888 Allergy status to other drugs, medicaments and biological substances status: Secondary | ICD-10-CM | POA: Diagnosis not present

## 2020-08-03 DIAGNOSIS — I482 Chronic atrial fibrillation, unspecified: Secondary | ICD-10-CM | POA: Diagnosis present

## 2020-08-03 DIAGNOSIS — I480 Paroxysmal atrial fibrillation: Secondary | ICD-10-CM | POA: Diagnosis present

## 2020-08-03 DIAGNOSIS — R6510 Systemic inflammatory response syndrome (SIRS) of non-infectious origin without acute organ dysfunction: Secondary | ICD-10-CM | POA: Diagnosis not present

## 2020-08-03 DIAGNOSIS — A419 Sepsis, unspecified organism: Secondary | ICD-10-CM | POA: Diagnosis not present

## 2020-08-03 DIAGNOSIS — R1030 Lower abdominal pain, unspecified: Secondary | ICD-10-CM

## 2020-08-03 DIAGNOSIS — R109 Unspecified abdominal pain: Secondary | ICD-10-CM | POA: Diagnosis present

## 2020-08-03 DIAGNOSIS — I10 Essential (primary) hypertension: Secondary | ICD-10-CM | POA: Diagnosis not present

## 2020-08-03 DIAGNOSIS — R000 Tachycardia, unspecified: Secondary | ICD-10-CM | POA: Diagnosis not present

## 2020-08-03 DIAGNOSIS — J90 Pleural effusion, not elsewhere classified: Secondary | ICD-10-CM | POA: Diagnosis not present

## 2020-08-03 DIAGNOSIS — J9811 Atelectasis: Secondary | ICD-10-CM | POA: Diagnosis not present

## 2020-08-03 DIAGNOSIS — R509 Fever, unspecified: Secondary | ICD-10-CM

## 2020-08-03 DIAGNOSIS — Z8249 Family history of ischemic heart disease and other diseases of the circulatory system: Secondary | ICD-10-CM

## 2020-08-03 DIAGNOSIS — I130 Hypertensive heart and chronic kidney disease with heart failure and stage 1 through stage 4 chronic kidney disease, or unspecified chronic kidney disease: Secondary | ICD-10-CM | POA: Diagnosis present

## 2020-08-03 DIAGNOSIS — R140 Abdominal distension (gaseous): Secondary | ICD-10-CM | POA: Diagnosis not present

## 2020-08-03 DIAGNOSIS — I493 Ventricular premature depolarization: Secondary | ICD-10-CM | POA: Diagnosis not present

## 2020-08-03 DIAGNOSIS — Z86711 Personal history of pulmonary embolism: Secondary | ICD-10-CM

## 2020-08-03 DIAGNOSIS — G4733 Obstructive sleep apnea (adult) (pediatric): Secondary | ICD-10-CM | POA: Diagnosis present

## 2020-08-03 DIAGNOSIS — I4891 Unspecified atrial fibrillation: Secondary | ICD-10-CM | POA: Diagnosis present

## 2020-08-03 DIAGNOSIS — I5020 Unspecified systolic (congestive) heart failure: Secondary | ICD-10-CM | POA: Diagnosis not present

## 2020-08-03 DIAGNOSIS — I5022 Chronic systolic (congestive) heart failure: Secondary | ICD-10-CM | POA: Diagnosis present

## 2020-08-03 DIAGNOSIS — I351 Nonrheumatic aortic (valve) insufficiency: Secondary | ICD-10-CM | POA: Diagnosis not present

## 2020-08-03 DIAGNOSIS — Z8673 Personal history of transient ischemic attack (TIA), and cerebral infarction without residual deficits: Secondary | ICD-10-CM

## 2020-08-03 DIAGNOSIS — Z85828 Personal history of other malignant neoplasm of skin: Secondary | ICD-10-CM

## 2020-08-03 DIAGNOSIS — Z79899 Other long term (current) drug therapy: Secondary | ICD-10-CM

## 2020-08-03 DIAGNOSIS — N200 Calculus of kidney: Secondary | ICD-10-CM | POA: Diagnosis not present

## 2020-08-03 HISTORY — DX: Non-ST elevation (NSTEMI) myocardial infarction: I21.4

## 2020-08-03 LAB — CBC WITH DIFFERENTIAL/PLATELET
Abs Immature Granulocytes: 0.05 10*3/uL (ref 0.00–0.07)
Basophils Absolute: 0 10*3/uL (ref 0.0–0.1)
Basophils Relative: 0 %
Eosinophils Absolute: 0 10*3/uL (ref 0.0–0.5)
Eosinophils Relative: 0 %
HCT: 31.4 % — ABNORMAL LOW (ref 39.0–52.0)
Hemoglobin: 10 g/dL — ABNORMAL LOW (ref 13.0–17.0)
Immature Granulocytes: 0 %
Lymphocytes Relative: 2 %
Lymphs Abs: 0.3 10*3/uL — ABNORMAL LOW (ref 0.7–4.0)
MCH: 29.2 pg (ref 26.0–34.0)
MCHC: 31.8 g/dL (ref 30.0–36.0)
MCV: 91.8 fL (ref 80.0–100.0)
Monocytes Absolute: 1 10*3/uL (ref 0.1–1.0)
Monocytes Relative: 9 %
Neutro Abs: 10.2 10*3/uL — ABNORMAL HIGH (ref 1.7–7.7)
Neutrophils Relative %: 89 %
Platelets: 636 10*3/uL — ABNORMAL HIGH (ref 150–400)
RBC: 3.42 MIL/uL — ABNORMAL LOW (ref 4.22–5.81)
RDW: 15.9 % — ABNORMAL HIGH (ref 11.5–15.5)
WBC: 11.5 10*3/uL — ABNORMAL HIGH (ref 4.0–10.5)
nRBC: 0 % (ref 0.0–0.2)

## 2020-08-03 LAB — URINALYSIS, ROUTINE W REFLEX MICROSCOPIC
Bilirubin Urine: NEGATIVE
Glucose, UA: >=500 mg/dL — AB
Hgb urine dipstick: NEGATIVE
Ketones, ur: 5 mg/dL — AB
Leukocytes,Ua: NEGATIVE
Nitrite: NEGATIVE
Protein, ur: NEGATIVE mg/dL
Specific Gravity, Urine: 1.027 (ref 1.005–1.030)
pH: 5 (ref 5.0–8.0)

## 2020-08-03 LAB — COMPREHENSIVE METABOLIC PANEL
ALT: 19 U/L (ref 0–44)
AST: 62 U/L — ABNORMAL HIGH (ref 15–41)
Albumin: 3 g/dL — ABNORMAL LOW (ref 3.5–5.0)
Alkaline Phosphatase: 83 U/L (ref 38–126)
Anion gap: 18 — ABNORMAL HIGH (ref 5–15)
BUN: 27 mg/dL — ABNORMAL HIGH (ref 8–23)
CO2: 17 mmol/L — ABNORMAL LOW (ref 22–32)
Calcium: 8.6 mg/dL — ABNORMAL LOW (ref 8.9–10.3)
Chloride: 105 mmol/L (ref 98–111)
Creatinine, Ser: 1.51 mg/dL — ABNORMAL HIGH (ref 0.61–1.24)
GFR, Estimated: 48 mL/min — ABNORMAL LOW (ref >60)
Glucose, Bld: 212 mg/dL — ABNORMAL HIGH (ref 70–99)
Potassium: 4.3 mmol/L (ref 3.5–5.1)
Sodium: 140 mmol/L (ref 135–145)
Total Bilirubin: 1.2 mg/dL (ref 0.3–1.2)
Total Protein: 6 g/dL — ABNORMAL LOW (ref 6.5–8.1)

## 2020-08-03 LAB — RESP PANEL BY RT-PCR (FLU A&B, COVID) ARPGX2
Influenza A by PCR: NEGATIVE
Influenza B by PCR: NEGATIVE
SARS Coronavirus 2 by RT PCR: NEGATIVE

## 2020-08-03 LAB — LACTIC ACID, PLASMA: Lactic Acid, Venous: 1.8 mmol/L (ref 0.5–1.9)

## 2020-08-03 LAB — APTT: aPTT: 25 seconds (ref 24–36)

## 2020-08-03 LAB — PROTIME-INR
INR: 1.3 — ABNORMAL HIGH (ref 0.8–1.2)
Prothrombin Time: 15.2 seconds (ref 11.4–15.2)

## 2020-08-03 MED ORDER — OXYCODONE-ACETAMINOPHEN 5-325 MG PO TABS
1.0000 | ORAL_TABLET | ORAL | Status: DC | PRN
  Administered 2020-08-03 – 2020-08-05 (×6): 1 via ORAL
  Filled 2020-08-03 (×7): qty 1

## 2020-08-03 MED ORDER — ONDANSETRON HCL 4 MG/2ML IJ SOLN
4.0000 mg | Freq: Once | INTRAMUSCULAR | Status: AC
  Administered 2020-08-03: 4 mg via INTRAVENOUS
  Filled 2020-08-03: qty 2

## 2020-08-03 MED ORDER — ACETAMINOPHEN 650 MG RE SUPP: 650.0000 mg | Freq: Four times a day (QID) | RECTAL | Status: DC | PRN

## 2020-08-03 MED ORDER — IOHEXOL 300 MG/ML  SOLN
100.0000 mL | Freq: Once | INTRAMUSCULAR | Status: AC | PRN
  Administered 2020-08-03: 100 mL via INTRAVENOUS

## 2020-08-03 MED ORDER — MORPHINE SULFATE (PF) 2 MG/ML IV SOLN: 2.0000 mg | INTRAVENOUS | Status: DC | PRN

## 2020-08-03 MED ORDER — LACTATED RINGERS IV SOLN: INTRAVENOUS | Status: AC

## 2020-08-03 MED ORDER — ACETAMINOPHEN 325 MG PO TABS
650.0000 mg | ORAL_TABLET | Freq: Four times a day (QID) | ORAL | Status: DC | PRN
  Administered 2020-08-04 – 2020-08-09 (×9): 650 mg via ORAL
  Filled 2020-08-03 (×9): qty 2

## 2020-08-03 MED ORDER — METOPROLOL TARTRATE 25 MG PO TABS
25.0000 mg | ORAL_TABLET | Freq: Two times a day (BID) | ORAL | Status: DC
  Administered 2020-08-03 – 2020-08-06 (×6): 25 mg via ORAL
  Filled 2020-08-03 (×6): qty 1

## 2020-08-03 MED ORDER — PIPERACILLIN-TAZOBACTAM 3.375 G IVPB 30 MIN
3.3750 g | Freq: Once | INTRAVENOUS | Status: AC
  Administered 2020-08-03: 3.375 g via INTRAVENOUS
  Filled 2020-08-03: qty 50

## 2020-08-03 MED ORDER — PIPERACILLIN-TAZOBACTAM 3.375 G IVPB: 3.3750 g | Freq: Three times a day (TID) | INTRAVENOUS | Status: DC

## 2020-08-03 MED ORDER — ROSUVASTATIN CALCIUM 20 MG PO TABS
20.0000 mg | ORAL_TABLET | Freq: Every day | ORAL | Status: DC
Start: 2020-08-04 — End: 2020-08-10
  Administered 2020-08-04 – 2020-08-10 (×7): 20 mg via ORAL
  Filled 2020-08-03 (×7): qty 1

## 2020-08-03 MED ORDER — INSULIN ASPART 100 UNIT/ML ~~LOC~~ SOLN
0.0000 [IU] | Freq: Three times a day (TID) | SUBCUTANEOUS | Status: DC
  Administered 2020-08-04: 5 [IU] via SUBCUTANEOUS
  Administered 2020-08-04: 2 [IU] via SUBCUTANEOUS
  Administered 2020-08-04: 3 [IU] via SUBCUTANEOUS
  Administered 2020-08-04 – 2020-08-05 (×4): 2 [IU] via SUBCUTANEOUS
  Administered 2020-08-05: 3 [IU] via SUBCUTANEOUS
  Administered 2020-08-06: 2 [IU] via SUBCUTANEOUS
  Administered 2020-08-06 – 2020-08-07 (×4): 3 [IU] via SUBCUTANEOUS
  Administered 2020-08-07 – 2020-08-08 (×4): 2 [IU] via SUBCUTANEOUS
  Administered 2020-08-08: 3 [IU] via SUBCUTANEOUS
  Administered 2020-08-08: 2 [IU] via SUBCUTANEOUS
  Administered 2020-08-09 (×3): 3 [IU] via SUBCUTANEOUS
  Administered 2020-08-09: 2 [IU] via SUBCUTANEOUS
  Administered 2020-08-10 (×2): 3 [IU] via SUBCUTANEOUS

## 2020-08-03 MED ORDER — POLYETHYLENE GLYCOL 3350 17 G PO PACK: 17.0000 g | PACK | Freq: Every day | ORAL | Status: DC | PRN

## 2020-08-03 MED ORDER — ENOXAPARIN SODIUM 40 MG/0.4ML ~~LOC~~ SOLN
40.0000 mg | SUBCUTANEOUS | Status: DC
  Administered 2020-08-03 – 2020-08-05 (×3): 40 mg via SUBCUTANEOUS
  Filled 2020-08-03 (×3): qty 0.4

## 2020-08-03 MED ORDER — FENOFIBRATE 160 MG PO TABS
160.0000 mg | ORAL_TABLET | Freq: Every day | ORAL | Status: DC
  Administered 2020-08-04 – 2020-08-10 (×7): 160 mg via ORAL
  Filled 2020-08-03 (×7): qty 1

## 2020-08-03 MED ORDER — ONDANSETRON HCL 4 MG/2ML IJ SOLN
4.0000 mg | Freq: Four times a day (QID) | INTRAMUSCULAR | Status: DC | PRN
  Administered 2020-08-03: 4 mg via INTRAVENOUS
  Filled 2020-08-03 (×2): qty 2

## 2020-08-03 MED ORDER — INSULIN GLARGINE 100 UNIT/ML ~~LOC~~ SOLN
20.0000 [IU] | Freq: Every day | SUBCUTANEOUS | Status: DC
  Administered 2020-08-04: 20 [IU] via SUBCUTANEOUS
  Filled 2020-08-03 (×3): qty 0.2

## 2020-08-03 MED ORDER — ONDANSETRON HCL 4 MG PO TABS
4.0000 mg | ORAL_TABLET | Freq: Four times a day (QID) | ORAL | Status: DC | PRN
  Administered 2020-08-04 (×2): 4 mg via ORAL
  Filled 2020-08-03 (×2): qty 1

## 2020-08-03 MED ORDER — ACETAMINOPHEN 500 MG PO TABS
1000.0000 mg | ORAL_TABLET | Freq: Once | ORAL | Status: AC
  Administered 2020-08-03: 1000 mg via ORAL
  Filled 2020-08-03: qty 2

## 2020-08-03 MED ORDER — SODIUM CHLORIDE 0.9 % IV BOLUS (SEPSIS)
1000.0000 mL | Freq: Once | INTRAVENOUS | Status: AC
  Administered 2020-08-03: 1000 mL via INTRAVENOUS

## 2020-08-03 MED ORDER — ASPIRIN EC 325 MG PO TBEC
325.0000 mg | DELAYED_RELEASE_TABLET | Freq: Every day | ORAL | Status: DC
  Administered 2020-08-04 – 2020-08-06 (×3): 325 mg via ORAL
  Filled 2020-08-03 (×3): qty 1

## 2020-08-03 NOTE — ED Provider Notes (Signed)
[facility] EMERGENCY DEPARTMENT Provider Note   CSN: [id number] Arrival date & time: 08/03/20  1617     History Chief Complaint  Patient presents with  . Abdominal Pain  . Emesis    [facility] is a 75 y.o. male.  HPI      75 year old male with history of CABG July 09, 2020, hypertension, hyperlipidemia, pulmonary emboli, diabetes, history of CVA, OSA presents with concern for abdominal pain and chills.  Had a visit a few days ago with Dr. Lucianne Lei trigt following his CABG. reports he was in a normal state of health until today.  Before lunch, he began to feel sick, with abdominal pain.  Describes it as a pressure on his stomach, as pain right across the middle of his abdomen.  Denies radiation of pain.  Reports it is somewhat worse with eating or drinking.  Had 3 episodes of vomiting, reports nausea.  Had a normal bowel movement this morning.  Denies diarrhea, cough, congestion, sore throat, body aches, dyspnea or chest pain.  No known fevers at home but reported he had chills, and has a fever to 101 on arrival to the emergency department.    Past Medical History:  Diagnosis Date  . Arthritis   . 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 Cancer (HCC)    Skin  . Cerebral artery disease   . Coronary artery disease   . COVID-07 Dec 2019  . Depression   . Essential hypertension   . Hernia, umbilical   . HOH (hard of hearing)   . Hyperlipidemia   . OSA on CPAP   . Pulmonary emboli [facility])    May 2021  . Type 2 diabetes mellitus  [facility])     Patient Active Problem List   Diagnosis Date Noted  . Postop check 07/25/2020  . Hx of CABG 07/09/2020  . CAD in native artery 07/04/2020  . Abnormal nuclear stress test   . COVID-19 virus infection 12/02/2019  . Multiple subsegmental pulmonary emboli without acute cor pulmonale ([place])   . New onset a-fib ([place])   . NSTEMI (non-ST elevated myocardial infarction) ([place])   . Hypersomnia 08/30/2019  . History of  adenomatous polyp of colon 05/06/2018  . Vasomotor rhinitis 07/18/2017  . Arthritis of knee, right 01/06/2014  . Brainstem stroke ([place]) 09/07/2011    Class: Acute  . Dysarthria 09/05/2011    Class: Acute  . Chronic kidney disease (CKD), stage II (mild) 09/05/2011    Class: Chronic  . Hypertension 09/05/2011    Class: Chronic  . DM (diabetes mellitus), type 2 ([place]) 09/05/2011    Class: Chronic  . Hyperlipidemia 09/05/2011    Class: Chronic  . Obstructive sleep apnea 09/05/2011    Class: Chronic  . Anemia 09/05/2011    Class: Chronic    Past Surgical History:  Procedure Laterality Date  . COLONOSCOPY W/ POLYPECTOMY    . CORONARY ARTERY BYPASS GRAFT N/A 07/09/2020   Procedure: CORONARY ARTERY BYPASS GRAFTING (CABG) TIMES THREE , USING LEFT INTERNAL MAMMARY ARTERY AND RIGHT LEG GREATER SAPHENOUS VEIN HARVESTED ENDOSCOPICALLY;  Surgeon: Ivin Poot, MD;  Location: [place];  Service: Open Heart Surgery;  Laterality: N/A;  . LEFT HEART CATH AND CORONARY ANGIOGRAPHY N/A 07/04/2020   Procedure: LEFT HEART CATH AND CORONARY ANGIOGRAPHY;  Surgeon: Troy Sine, MD;  Location: [place] [facility];  Service: Cardiovascular;  Laterality: N/A;  . TEE WITHOUT CARDIOVERSION N/A 07/09/2020   Procedure: TRANSESOPHAGEAL ECHOCARDIOGRAM (  TEE);  Surgeon: Ivin Poot, MD;  Location: [place];  Service: Open Heart Surgery;  Laterality: N/A;  . TOTAL KNEE ARTHROPLASTY Left 04/22/2013   Procedure: TOTAL KNEE ARTHROPLASTY- left;  Surgeon: Augustin Schooling, MD;  Location: [place];  Service: Orthopedics;  Laterality: Left;  with femoral block  . TOTAL KNEE ARTHROPLASTY Right 01/06/2014   Procedure: RIGHT TOTAL KNEE ARTHROPLASTY;  Surgeon: Augustin Schooling, MD;  Location: [place];  Service: Orthopedics;  Laterality: Right;       Family History  Problem Relation Age of Onset  . CAD Brother        Premature    Social History   Tobacco Use  . Smoking status: Never Smoker  . Smokeless tobacco: Never Used   Substance Use Topics  . Alcohol use: No  . Drug use: No    Home Medications Prior to Admission medications   Medication Sig Start Date End Date Taking? Authorizing Provider  aspirin EC 325 MG EC tablet Take 1 tablet (325 mg total) by mouth daily. 07/16/20   Gold, Wilder Glade, PA-C  BD PEN NEEDLE NANO U/F 32G X 4 MM MISC 1 each by Other route daily.  03/19/18   [provider]  Empagliflozin-metFORMIN HCl 11-998 MG TABS Take 1 tablet by mouth 2 (two) times daily.    [provider]  fenofibrate 160 MG tablet Take 160 mg by mouth daily.    [provider]  ferrous [id number] C-folic acid (TRINSICON / FOLTRIN) capsule Take 1 capsule by mouth 2 (two) times daily after a meal. 07/16/20   Gold, Wilder Glade, PA-C  HUMALOG KWIKPEN 100 UNIT/ML KwikPen Inject 10 Units into the skin with breakfast, with lunch, and with evening meal.  04/27/18   [provider]  methylphenidate (RITALIN) 5 MG tablet Take 5 mg by mouth once a week. On Sunday mornings    [provider]  metoprolol tartrate (LOPRESSOR) 25 MG tablet Take 1 tablet (25 mg total) by mouth 2 (two) times daily. 07/16/20   Gold, Wilder Glade, PA-C  ONETOUCH VERIO test strip 1 each by Other route in the morning, at noon, and at bedtime.  03/20/18   [provider]  rosuvastatin (CRESTOR) 20 MG tablet Take 1 tablet (20 mg total) by mouth daily. 07/16/20   Gold, Wayne E, PA-C  TOUJEO SOLOSTAR 300 UNIT/ML SOPN Inject 30 Units into the skin daily.  06/25/18   [provider]  traMADol (ULTRAM) 50 MG tablet Take 1 tablet (50 mg total) by mouth every 6 (six) hours as needed for moderate pain. 07/16/20   John Giovanni, PA-C    Allergies    Lipitor [atorvastatin]  Review of Systems   Review of Systems  Constitutional: Positive for fatigue. Negative for fever.  HENT: Negative for sore throat.   Eyes: Negative for visual disturbance.  Respiratory: Negative for cough and shortness of breath.    Cardiovascular: Negative for chest pain.  Gastrointestinal: Positive for abdominal pain, nausea and vomiting. Negative for constipation and diarrhea.  Genitourinary: Negative for difficulty urinating.  Musculoskeletal: Negative for back pain and neck stiffness.  Skin: Negative for rash.  Neurological: Negative for syncope and headaches.    Physical Exam Updated Vital Signs BP 137/67   Pulse (!) 106   Temp 99.8 F (37.7 C) (Oral)   Resp 17   SpO2 98%   Physical Exam Vitals and nursing note reviewed.  Constitutional:      General: He is not in  acute distress.    Appearance: He is well-developed and well-nourished. He is ill-appearing. He is not diaphoretic.  HENT:     Head: Normocephalic and atraumatic.  Eyes:     Extraocular Movements: EOM normal.     Conjunctiva/sclera: Conjunctivae normal.  Cardiovascular:     Rate and Rhythm: Regular rhythm. Tachycardia present.     Pulses: Intact distal pulses.  Pulmonary:     Effort: Pulmonary effort is normal. No respiratory distress.  Abdominal:     General: There is no distension.     Palpations: Abdomen is soft.     Tenderness: There is abdominal tenderness in the periumbilical area. There is no guarding. Positive signs include McBurney's sign.     Hernia: A hernia is present. Hernia is present in the umbilical area.  Musculoskeletal:        General: No edema.     Cervical back: Normal range of motion.  Skin:    General: Skin is warm and dry.  Neurological:     Mental Status: He is alert and oriented to person, place, and time.     ED Results / Procedures / Treatments   Labs (all labs ordered are listed, but only abnormal results are displayed) Labs Reviewed  COMPREHENSIVE METABOLIC PANEL - Abnormal; Notable for the following components:      Result Value   CO2 17 (*)    Glucose, Bld 212 (*)    BUN 27 (*)    Creatinine, Ser 1.51 (*)    Calcium 8.6 (*)    Total Protein 6.0 (*)    Albumin 3.0 (*)    AST 62 (*)     GFR, Estimated 48 (*)    Anion gap 18 (*)    All other components within normal limits  CBC WITH DIFFERENTIAL/PLATELET - Abnormal; Notable for the following components:   WBC 11.5 (*)    RBC 3.42 (*)    Hemoglobin 10.0 (*)    HCT 31.4 (*)    RDW 15.9 (*)    Platelets 636 (*)    Neutro Abs 10.2 (*)    Lymphs Abs 0.3 (*)    All other components within normal limits  PROTIME-INR - Abnormal; Notable for the following components:   INR 1.3 (*)    All other components within normal limits  URINALYSIS, ROUTINE W REFLEX MICROSCOPIC - Abnormal; Notable for the following components:   Glucose, UA >=500 (*)    Ketones, ur 5 (*)    Bacteria, UA RARE (*)    All other components within normal limits  CULTURE, BLOOD (ROUTINE X 2)  CULTURE, BLOOD (ROUTINE X 2)  URINE CULTURE  RESP PANEL BY RT-PCR (FLU A&B, COVID) ARPGX2  LACTIC ACID, PLASMA  APTT  LACTIC ACID, PLASMA    EKG EKG Interpretation  Date/Time:  Friday August 03 2020 16:31:00 EST Ventricular Rate:  119 PR Interval:  126 QRS Duration: 80 QT Interval:  338 QTC Calculation: 475 R Axis:   97 Text Interpretation: Sinus tachycardia Rightward axis Possible Anterior infarct , age undetermined Inferior injury pattern Artifact limits ability to interpret Confirmed by Gareth Morgan [phone]) on 08/03/2020 6:04:22 PM   Radiology CT ABDOMEN PELVIS W CONTRAST  Result Date: 08/03/2020 CLINICAL DATA:  Acute right lower quadrant abdominal pain. EXAM: CT ABDOMEN AND PELVIS WITH CONTRAST TECHNIQUE: Multidetector CT imaging of the abdomen and pelvis was performed using the standard protocol following bolus administration of intravenous contrast. CONTRAST:  168mL OMNIPAQUE IOHEXOL 300 MG/ML  SOLN COMPARISON:  None. FINDINGS: Lower chest: No acute abnormality. Hepatobiliary: No focal liver abnormality is seen. No gallstones, gallbladder wall thickening, or biliary dilatation. Pancreas: Unremarkable. No pancreatic ductal dilatation or surrounding  inflammatory changes. Spleen: Normal in size without focal abnormality. Adrenals/Urinary Tract: Adrenal glands appear normal. Bilateral parapelvic cysts are noted. No hydronephrosis or renal obstruction is noted. No renal or ureteral calculi are noted. Urinary bladder is unremarkable. Stomach/Bowel: Stomach is within normal limits. Appendix appears normal. No evidence of bowel wall thickening, distention, or inflammatory changes. Vascular/Lymphatic: Aortic atherosclerosis. No enlarged abdominal or pelvic lymph nodes. Reproductive: Prostate is unremarkable. Other: Small fat containing periumbilical hernia is noted. No ascites is noted. Musculoskeletal: No acute or significant osseous findings. IMPRESSION: Aortic atherosclerosis. Small fat containing periumbilical hernia. No acute abnormality seen in the abdomen or pelvis. Aortic Atherosclerosis (ICD10-I70.0). Electronically Signed   By: Marijo Conception M.D.   On: 08/03/2020 20:09   DG Chest Port 1 View  Result Date: 08/03/2020 CLINICAL DATA:  Questionable sepsis, sudden onset abdominal pain of a cyst. Recent CABG surgery last month. Fever EXAM: PORTABLE CHEST 1 VIEW COMPARISON:  Chest x-ray 07/25/2020. CT chest 07/06/2020. FINDINGS: The heart size and mediastinal contours are unchanged. Sternotomy wires with associated cardiac surgical changes noted. No focal consolidation. No pulmonary edema. No pleural effusion. No pneumothorax. No acute osseous abnormality. IMPRESSION: No active disease. Electronically Signed   By: Iven Finn M.D.   On: 08/03/2020 17:32    Procedures Procedures (including critical care time)  Medications Ordered in ED Medications  piperacillin-tazobactam (ZOSYN) IVPB 3.375 g ( Intravenous Stopped 08/03/20 2002)    Followed by  piperacillin-tazobactam (ZOSYN) IVPB 3.375 g (has no administration in time range)  sodium chloride 0.9 % bolus 1,000 mL (1,000 mLs Intravenous New Bag/Given 08/03/20 1905)  acetaminophen (TYLENOL) tablet  1,000 mg (1,000 mg Oral Given 08/03/20 1904)  ondansetron (ZOFRAN) injection 4 mg (4 mg Intravenous Given 08/03/20 1927)  iohexol (OMNIPAQUE) 300 MG/ML solution 100 mL (100 mLs Intravenous Contrast Given 08/03/20 1956)    ED Course  I have reviewed the triage vital signs and the nursing notes.  Pertinent labs & imaging results that were available during my care of the patient were reviewed by me and considered in my medical decision making (see chart for details).    MDM Rules/Calculators/A&P                          75 year old male with history of CABG July 09, 2020, hypertension, hyperlipidemia, pulmonary emboli, diabetes, history of CVA, OSA presents with concern for abdominal pain and chills.  Febrile to 101 with tachycardia on arrival to the ED.  Concern for possible sepsis secondary to intra-abdominal source, UTI, or COVID-19 by history.  Chest x-ray without acute abnormalities, denies cough, shortness of breath or chest pain and doubt intrathoracic source of fever.  Ordered 1 L of normal saline, Zosyn to cover intra-abdominal etiology of possible sepsis as lab work and CT imaging is pending.  Hemoglobin stable, WBC 11.5.   CT abdomen shows fat containing periumbilical hernia. Unclear if his abdominal pain is secondary to this and/or also due to other underlying pathology such as COVID 19 or influenza as these tests are pending.  No sign of other intraabdominal infection, no sign of obstruction or bowel involvement.  Discussed with General Surgery given symptomatic to see if they would like to see outpatient or while here and Dr. Melony Overly resident coming to bedside for evaluation.  Given persisting tachycardia, r/o COVID 19/influenza vs sepsis of unknown source, will plan to admit for further care.    Final Clinical Impression(s) / ED Diagnoses Final diagnoses:  Sepsis without acute organ dysfunction, due to unspecified organism [facility])  Lower abdominal pain  Periumbilical pain   Ventral hernia without obstruction or gangrene    Rx / DC Orders ED Discharge Orders    None       Gareth Morgan, MD 08/04/20 [phone]

## 2020-08-03 NOTE — ED Provider Notes (Incomplete)
[place] EMERGENCY DEPARTMENT Provider Note   CSN: [id number] Arrival date & time: 08/03/20  1617     History Chief Complaint  Patient presents with  . Abdominal Pain  . Emesis    Patrick Miranda is a 75 y.o. male.  HPI     Past Medical History:  Diagnosis Date  . Arthritis   . 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 Cancer (HCC)    Skin  . Cerebral artery disease   . Coronary artery disease   . COVID-07 Dec 2019  . Depression   . Essential hypertension   . Hernia, umbilical   . HOH (hard of hearing)   . Hyperlipidemia   . OSA on CPAP   . Pulmonary emboli [facility])    May 2021  . Type 2 diabetes mellitus [facility])     Patient Active Problem List   Diagnosis Date Noted  . Postop check 07/25/2020  . Hx of CABG 07/09/2020  . CAD in native artery 07/04/2020  . Abnormal nuclear stress test   . COVID-19 virus infection 12/02/2019  . Multiple subsegmental pulmonary emboli without acute cor pulmonale ([place])   . New onset a-fib ([place])   . NSTEMI (non-ST elevated myocardial infarction) ([place])   . Hypersomnia 08/30/2019  . History of adenomatous polyp of colon 05/06/2018  . Vasomotor rhinitis 07/18/2017  . Arthritis of knee, right 01/06/2014  . Brainstem stroke ([place]) 09/07/2011    Class: Acute  . Dysarthria 09/05/2011    Class: Acute  . Chronic kidney disease (CKD), stage II (mild) 09/05/2011    Class: Chronic  . Hypertension 09/05/2011    Class: Chronic  . DM (diabetes mellitus), type 2 ([place]) 09/05/2011    Class: Chronic  . Hyperlipidemia 09/05/2011    Class: Chronic  . Obstructive sleep apnea 09/05/2011    Class: Chronic  . Anemia 09/05/2011    Class: Chronic    Past Surgical History:  Procedure Laterality Date  . COLONOSCOPY W/ POLYPECTOMY    . CORONARY ARTERY BYPASS GRAFT N/A 07/09/2020   Procedure: CORONARY ARTERY BYPASS GRAFTING (CABG) TIMES THREE , USING LEFT INTERNAL MAMMARY ARTERY AND RIGHT LEG GREATER SAPHENOUS VEIN HARVESTED  ENDOSCOPICALLY;  Surgeon: Ivin Poot, MD;  Location: [place];  Service: Open Heart Surgery;  Laterality: N/A;  . LEFT HEART CATH AND CORONARY ANGIOGRAPHY N/A 07/04/2020   Procedure: LEFT HEART CATH AND CORONARY ANGIOGRAPHY;  Surgeon: Troy Sine, MD;  Location: [place] CV LAB;  Service: Cardiovascular;  Laterality: N/A;  . TEE WITHOUT CARDIOVERSION N/A 07/09/2020   Procedure: TRANSESOPHAGEAL ECHOCARDIOGRAM (TEE);  Surgeon: Prescott Gum, Collier Salina, MD;  Location: [place];  Service: Open Heart Surgery;  Laterality: N/A;  . TOTAL KNEE ARTHROPLASTY Left 04/22/2013   Procedure: TOTAL KNEE ARTHROPLASTY- left;  Surgeon: Augustin Schooling, MD;  Location: [place];  Service: Orthopedics;  Laterality: Left;  with femoral block  . TOTAL KNEE ARTHROPLASTY Right 01/06/2014   Procedure: RIGHT TOTAL KNEE ARTHROPLASTY;  Surgeon: Augustin Schooling, MD;  Location: [place];  Service: Orthopedics;  Laterality: Right;       Family History  Problem Relation Age of Onset  . CAD Brother        Premature    Social History   Tobacco Use  . Smoking status: Never Smoker  . Smokeless tobacco: Never Used  Substance Use Topics  . Alcohol use: No  . Drug use: No    Home Medications Prior to Admission  medications   Medication Sig Start Date End Date Taking? Authorizing Provider  aspirin EC 325 MG EC tablet Take 1 tablet (325 mg total) by mouth daily. 07/16/20   Gold, Wilder Glade, PA-C  BD PEN NEEDLE NANO U/F 32G X 4 MM MISC 1 each by Other route daily.  03/19/18   [provider]  Empagliflozin-metFORMIN HCl 11-998 MG TABS Take 1 tablet by mouth 2 (two) times daily.    [provider]  fenofibrate 160 MG tablet Take 160 mg by mouth daily.    [provider]  ferrous [id number] C-folic acid (TRINSICON / FOLTRIN) capsule Take 1 capsule by mouth 2 (two) times daily after a meal. 07/16/20   Gold, Wilder Glade, PA-C  HUMALOG KWIKPEN 100 UNIT/ML KwikPen Inject 10 Units into the skin with breakfast,  with lunch, and with evening meal.  04/27/18   [provider]  methylphenidate (RITALIN) 5 MG tablet Take 5 mg by mouth once a week. On Sunday mornings    [provider]  metoprolol tartrate (LOPRESSOR) 25 MG tablet Take 1 tablet (25 mg total) by mouth 2 (two) times daily. 07/16/20   Gold, Wilder Glade, PA-C  ONETOUCH VERIO test strip 1 each by Other route in the morning, at noon, and at bedtime.  03/20/18   [provider]  rosuvastatin (CRESTOR) 20 MG tablet Take 1 tablet (20 mg total) by mouth daily. 07/16/20   Gold, Wayne E, PA-C  TOUJEO SOLOSTAR 300 UNIT/ML SOPN Inject 30 Units into the skin daily.  06/25/18   [provider]  traMADol (ULTRAM) 50 MG tablet Take 1 tablet (50 mg total) by mouth every 6 (six) hours as needed for moderate pain. 07/16/20   John Giovanni, PA-C    Allergies    Lipitor [atorvastatin]  Review of Systems   Review of Systems  Physical Exam Updated Vital Signs BP 132/78   Pulse (!) 123   Temp (!) 101 F (38.3 C) (Oral)   Resp (!) 22   SpO2 98%   Physical Exam  ED Results / Procedures / Treatments   Labs (all labs ordered are listed, but only abnormal results are displayed) Labs Reviewed - No data to display  EKG None  Radiology No results found.  Procedures Procedures (including critical care time)  Medications Ordered in ED Medications - No data to display  ED Course  I have reviewed the triage vital signs and the nursing notes.  Pertinent labs & imaging results that were available during my care of the patient were reviewed by me and considered in my medical decision making (see chart for details).    MDM Rules/Calculators/A&P                          *** Final Clinical Impression(s) / ED Diagnoses Final diagnoses:  None    Rx / DC Orders ED Discharge Orders    None

## 2020-08-03 NOTE — ED Provider Notes (Incomplete)
[place] EMERGENCY DEPARTMENT Provider Note   CSN: [id number] Arrival date & time: 08/03/20  1617     History Chief Complaint  Patient presents with  . Abdominal Pain  . Emesis    Patrick Miranda is a 75 y.o. male.  HPI     Before lunch started feeling sick, pressure on stomach. Pain right across middle.  Nothing seems to make it better or worse. Worse if drinking.   No radiation.  Nausea and vomiting 3 times.  Had normal BM this AM. No diarrhea.  No known fever at home. Has chills. No cough, congestion, sore throat. No dyspnea or chest pain. Taking blood thinners.   Past Medical History:  Diagnosis Date  . Arthritis   . 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 Cancer (HCC)    Skin  . Cerebral artery disease   . Coronary artery disease   . COVID-07 Dec 2019  . Depression   . Essential hypertension   . Hernia, umbilical   . HOH (hard of hearing)   . Hyperlipidemia   . OSA on CPAP   . Pulmonary emboli [facility])    May 2021  . Type 2 diabetes mellitus [facility])     Patient Active Problem List   Diagnosis Date Noted  . Postop check 07/25/2020  . Hx of CABG 07/09/2020  . CAD in native artery 07/04/2020  . Abnormal nuclear stress test   . COVID-19 virus infection 12/02/2019  . Multiple subsegmental pulmonary emboli without acute cor pulmonale ([place])   . New onset a-fib ([place])   . NSTEMI (non-ST elevated myocardial infarction) ([place])   . Hypersomnia 08/30/2019  . History of adenomatous polyp of colon 05/06/2018  . Vasomotor rhinitis 07/18/2017  . Arthritis of knee, right 01/06/2014  . Brainstem stroke ([place]) 09/07/2011    Class: Acute  . Dysarthria 09/05/2011    Class: Acute  . Chronic kidney disease (CKD), stage II (mild) 09/05/2011    Class: Chronic  . Hypertension 09/05/2011    Class: Chronic  . DM (diabetes mellitus), type 2 ([place]) 09/05/2011    Class: Chronic  . Hyperlipidemia 09/05/2011    Class: Chronic  . Obstructive sleep apnea  09/05/2011    Class: Chronic  . Anemia 09/05/2011    Class: Chronic    Past Surgical History:  Procedure Laterality Date  . COLONOSCOPY W/ POLYPECTOMY    . CORONARY ARTERY BYPASS GRAFT N/A 07/09/2020   Procedure: CORONARY ARTERY BYPASS GRAFTING (CABG) TIMES THREE , USING LEFT INTERNAL MAMMARY ARTERY AND RIGHT LEG GREATER SAPHENOUS VEIN HARVESTED ENDOSCOPICALLY;  Surgeon: Ivin Poot, MD;  Location: [place];  Service: Open Heart Surgery;  Laterality: N/A;  . LEFT HEART CATH AND CORONARY ANGIOGRAPHY N/A 07/04/2020   Procedure: LEFT HEART CATH AND CORONARY ANGIOGRAPHY;  Surgeon: Troy Sine, MD;  Location: [place] CV LAB;  Service: Cardiovascular;  Laterality: N/A;  . TEE WITHOUT CARDIOVERSION N/A 07/09/2020   Procedure: TRANSESOPHAGEAL ECHOCARDIOGRAM (TEE);  Surgeon: Prescott Gum, Collier Salina, MD;  Location: [place];  Service: Open Heart Surgery;  Laterality: N/A;  . TOTAL KNEE ARTHROPLASTY Left 04/22/2013   Procedure: TOTAL KNEE ARTHROPLASTY- left;  Surgeon: Augustin Schooling, MD;  Location: [place];  Service: Orthopedics;  Laterality: Left;  with femoral block  . TOTAL KNEE ARTHROPLASTY Right 01/06/2014   Procedure: RIGHT TOTAL KNEE ARTHROPLASTY;  Surgeon: Augustin Schooling, MD;  Location: [place];  Service: Orthopedics;  Laterality: Right;  Family History  Problem Relation Age of Onset  . CAD Brother        Premature    Social History   Tobacco Use  . Smoking status: Never Smoker  . Smokeless tobacco: Never Used  Substance Use Topics  . Alcohol use: No  . Drug use: No    Home Medications Prior to Admission medications   Medication Sig Start Date End Date Taking? Authorizing Provider  aspirin EC 325 MG EC tablet Take 1 tablet (325 mg total) by mouth daily. 07/16/20   Gold, Glenice Laine, PA-C  BD PEN NEEDLE NANO U/F 32G X 4 MM MISC 1 each by Other route daily.  03/19/18   [provider]  Empagliflozin-metFORMIN HCl 11-998 MG TABS Take 1 tablet by mouth 2 (two) times daily.     [provider]  fenofibrate 160 MG tablet Take 160 mg by mouth daily.    [provider]  ferrous fumarate-b12-vitamic C-folic acid (TRINSICON / FOLTRIN) capsule Take 1 capsule by mouth 2 (two) times daily after a meal. 07/16/20   Gold, Glenice Laine, PA-C  HUMALOG KWIKPEN 100 UNIT/ML KwikPen Inject 10 Units into the skin with breakfast, with lunch, and with evening meal.  04/27/18   [provider]  methylphenidate (RITALIN) 5 MG tablet Take 5 mg by mouth once a week. On Sunday mornings    [provider]  metoprolol tartrate (LOPRESSOR) 25 MG tablet Take 1 tablet (25 mg total) by mouth 2 (two) times daily. 07/16/20   Gold, Glenice Laine, PA-C  ONETOUCH VERIO test strip 1 each by Other route in the morning, at noon, and at bedtime.  03/20/18   [provider]  rosuvastatin (CRESTOR) 20 MG tablet Take 1 tablet (20 mg total) by mouth daily. 07/16/20   Gold, Wayne E, PA-C  TOUJEO SOLOSTAR 300 UNIT/ML SOPN Inject 30 Units into the skin daily.  06/25/18   [provider]  traMADol (ULTRAM) 50 MG tablet Take 1 tablet (50 mg total) by mouth every 6 (six) hours as needed for moderate pain. 07/16/20   Rowe Clack, PA-C    Allergies    Lipitor [atorvastatin]  Review of Systems   Review of Systems  Constitutional: Positive for activity change, chills and fever. Negative for appetite change.  HENT: Negative for congestion.   Eyes: Negative for visual disturbance.  Respiratory: Negative for cough and shortness of breath.   Cardiovascular: Negative for chest pain.  Gastrointestinal: Positive for abdominal pain, nausea and vomiting. Negative for constipation and diarrhea.  Genitourinary: Negative for difficulty urinating and dysuria.  Musculoskeletal: Negative for arthralgias and myalgias.  Skin: Negative for rash.  Neurological: Negative for headaches.    Physical Exam Updated Vital Signs BP 132/78   Pulse (!) 123   Temp (!) 101 F (38.3 C) (Oral)    Resp (!) 22   SpO2 98%   Physical Exam Vitals and nursing note reviewed.  Constitutional:      General: He is not in acute distress.    Appearance: He is well-developed and well-nourished. He is not diaphoretic.  HENT:     Head: Normocephalic and atraumatic.  Eyes:     Extraocular Movements: EOM normal.     Conjunctiva/sclera: Conjunctivae normal.  Cardiovascular:     Rate and Rhythm: Normal rate and regular rhythm.     Pulses: Intact distal pulses.     Heart sounds: Normal heart sounds. No murmur heard. No friction rub. No gallop.   Pulmonary:  Effort: Pulmonary effort is normal. No respiratory distress.     Breath sounds: Normal breath sounds. No wheezing or rales.  Abdominal:     General: There is no distension.     Palpations: Abdomen is soft.     Tenderness: There is abdominal tenderness. There is no guarding. Positive signs include McBurney's sign.  Musculoskeletal:        General: No edema.     Cervical back: Normal range of motion.  Skin:    General: Skin is warm and dry.  Neurological:     Mental Status: He is alert and oriented to person, place, and time.     ED Results / Procedures / Treatments   Labs (all labs ordered are listed, but only abnormal results are displayed) Labs Reviewed - No data to display  EKG None  Radiology No results found.  Procedures Procedures (including critical care time)  Medications Ordered in ED Medications - No data to display  ED Course  I have reviewed the triage vital signs and the nursing notes.  Pertinent labs & imaging results that were available during my care of the patient were reviewed by me and considered in my medical decision making (see chart for details).    MDM Rules/Calculators/A&P                          *** Final Clinical Impression(s) / ED Diagnoses Final diagnoses:  None    Rx / DC Orders ED Discharge Orders    None

## 2020-08-03 NOTE — Progress Notes (Signed)
Pharmacy Antibiotic Note  Patrick Miranda is a 75 y.o. male admitted on 08/03/2020 with sepsis secondary to suspected intra-abdominal infection.  Pharmacy has been consulted for zosyn dosing.  Scr 1.51, CrCl 45 ml/min.  Plan: Zosyn 3.375 g once over 30 min then 3.375g IV q8h (4 hour infusion). Monitor cultures, renal function, labs, clinical progression     Temp (24hrs), Avg:101 F (38.3 C), Min:101 F (38.3 C), Max:101 F (38.3 C)  No results for input(s): WBC, CREATININE, LATICACIDVEN, VANCOTROUGH, VANCOPEAK, VANCORANDOM, GENTTROUGH, GENTPEAK, GENTRANDOM, TOBRATROUGH, TOBRAPEAK, TOBRARND, AMIKACINPEAK, AMIKACINTROU, AMIKACIN in the last 168 hours.  Estimated Creatinine Clearance: 41 mL/min (A) (by C-G formula based on SCr of 1.66 mg/dL (H)).    Allergies  Allergen Reactions  . Lipitor [Atorvastatin] Other (See Comments)    Muscle pain    Antimicrobials this admission: Zosyn 1/14 >>   Dose adjustments this admission:  Microbiology results: 1/14 BCx: sent 1/14 UCx: sent   Thank you for allowing pharmacy to be a part of this patient's care.  Fara Olden, PharmD PGY-1 Pharmacy Resident 08/03/2020 5:17 PM Please see AMION for all pharmacy numbers

## 2020-08-03 NOTE — Consult Note (Signed)
Reason for Consult: Umbilical hernia Referring Physician: Emergency Medicine  G[place]burn is an 75 y.o. male with CAD s/p CABG, DM2, HTN, HLD, prior COVID (11/2019), PE who presents with lower abdominal pain consulted for CT finding of small fat-containing umbilical hernia.   HPI:  Has had lower abdominal pain with nonbilious emesis all day today, finally prompting ED visit. Has not had similar episodes of abdominal pain before. No known gastroparesis associated with DM2. Last BM today; continued passage of flatus. No prior abdominal operations. No sick contacts.   Does have a small umbilical hernia, which he says he's had for over three decades and is entirely asymptomatic. His pain is not at the site of his hernia.   Past Medical History:  Diagnosis Date  . Arthritis   . 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 Cancer (HCC)    Skin  . Cerebral artery disease   . Coronary artery disease   . COVID-07 Dec 2019  . Depression   . Essential hypertension   . Hernia, umbilical   . HOH (hard of hearing)   . Hyperlipidemia   . OSA on CPAP   . Pulmonary emboli [facility])    May 2021  . Type 2 diabetes mellitus ([place])     Past Surgical History:  Procedure Laterality Date  . COLONOSCOPY W/ POLYPECTOMY    . CORONARY ARTERY BYPASS GRAFT N/A 07/09/2020   Procedure: CORONARY ARTERY BYPASS GRAFTING (CABG) TIMES THREE , USING LEFT INTERNAL MAMMARY ARTERY AND RIGHT LEG GREATER SAPHENOUS VEIN HARVESTED ENDOSCOPICALLY;  Surgeon: Ivin Poot, MD;  Location: [place];  Service: Open Heart Surgery;  Laterality: N/A;  . LEFT HEART CATH AND CORONARY ANGIOGRAPHY N/A 07/04/2020   Procedure: LEFT HEART CATH AND CORONARY ANGIOGRAPHY;  Surgeon: Troy Sine, MD;  Location: [place] [facility];  Service: Cardiovascular;  Laterality: N/A;  . TEE WITHOUT CARDIOVERSION N/A 07/09/2020   Procedure: TRANSESOPHAGEAL ECHOCARDIOGRAM (TEE);  Surgeon: Prescott Gum, Collier Salina, MD;  Location: [place];  Service: Open Heart  Surgery;  Laterality: N/A;  . TOTAL KNEE ARTHROPLASTY Left 04/22/2013   Procedure: TOTAL KNEE ARTHROPLASTY- left;  Surgeon: Augustin Schooling, MD;  Location: [place];  Service: Orthopedics;  Laterality: Left;  with femoral block  . TOTAL KNEE ARTHROPLASTY Right 01/06/2014   Procedure: RIGHT TOTAL KNEE ARTHROPLASTY;  Surgeon: Augustin Schooling, MD;  Location: [place];  Service: Orthopedics;  Laterality: Right;    Family History  Problem Relation Age of Onset  . CAD Brother        Premature    Social History:  reports that he has never smoked. He has never used smokeless tobacco. He reports that he does not drink alcohol and does not use drugs.  Allergies:  Allergies  Allergen Reactions  . Lipitor [Atorvastatin] Other (See Comments)    Muscle pain    Medications: I have reviewed the patient's current medications.  Results for orders placed or performed during the hospital encounter of 08/03/20 (from the past 48 hour(s))  Lactic acid, plasma     Status: None   Collection Time: 08/03/20  5:56 PM  Result Value Ref Range   Lactic Acid, Venous 1.8 0.5 - 1.9 mmol/L    Comment: Performed at [facility], [address] [place], [place] [address]  Comprehensive metabolic panel     Status: Abnormal   Collection Time: 08/03/20  5:56 PM  Result Value Ref Range   Sodium 140 135 -  145 mmol/L   Potassium 4.3 3.5 - 5.1 mmol/L   Chloride 105 98 - 111 mmol/L   CO2 17 (L) 22 - 32 mmol/L   Glucose, Bld 212 (H) 70 - 99 mg/dL    Comment: Glucose reference range applies only to samples taken after fasting for at least 8 hours.   BUN 27 (H) 8 - 23 mg/dL   Creatinine, Ser 1.51 (H) 0.61 - 1.24 mg/dL   Calcium 8.6 (L) 8.9 - 10.3 mg/dL   Total Protein 6.0 (L) 6.5 - 8.1 g/dL   Albumin 3.0 (L) 3.5 - 5.0 g/dL   AST 62 (H) 15 - 41 U/L   ALT 19 0 - 44 U/L   Alkaline Phosphatase 83 38 - 126 U/L   Total Bilirubin 1.2 0.3 - 1.2 mg/dL   GFR, Estimated 48 (L) >60 mL/min    Comment: (NOTE) Calculated using  the CKD-EPI Creatinine Equation (2021)    Anion gap 18 (H) 5 - 15    Comment: Performed at [facility], [place] [address], [place], [place] [address]  CBC WITH DIFFERENTIAL     Status: Abnormal   Collection Time: 08/03/20  5:56 PM  Result Value Ref Range   WBC 11.5 (H) 4.0 - 10.5 K/uL   RBC 3.42 (L) 4.22 - 5.81 MIL/uL   Hemoglobin 10.0 (L) 13.0 - 17.0 g/dL   HCT 31.4 (L) 39.0 - 52.0 %   MCV 91.8 80.0 - 100.0 fL   MCH 29.2 26.0 - 34.0 pg   MCHC 31.8 30.0 - 36.0 g/dL   RDW 15.9 (H) 11.5 - 15.5 %   Platelets 636 (H) 150 - 400 K/uL   nRBC 0.0 0.0 - 0.2 %   Neutrophils Relative % 89 %   Neutro Abs 10.2 (H) 1.7 - 7.7 K/uL   Lymphocytes Relative 2 %   Lymphs Abs 0.3 (L) 0.7 - 4.0 K/uL   Monocytes Relative 9 %   Monocytes Absolute 1.0 0.1 - 1.0 K/uL   Eosinophils Relative 0 %   Eosinophils Absolute 0.0 0.0 - 0.5 K/uL   Basophils Relative 0 %   Basophils Absolute 0.0 0.0 - 0.1 K/uL   Immature Granulocytes 0 %   Abs Immature Granulocytes 0.05 0.00 - 0.07 K/uL    Comment: Performed at [facility], [place] [address] [place], [place] [address]  Protime-INR     Status: Abnormal   Collection Time: 08/03/20  5:56 PM  Result Value Ref Range   Prothrombin Time 15.2 11.4 - 15.2 seconds   INR 1.3 (H) 0.8 - 1.2    Comment: (NOTE) INR goal varies based on device and disease states. Performed at [facility], [place] [address] [place], [place] [address]   APTT     Status: None   Collection Time: 08/03/20  5:56 PM  Result Value Ref Range   aPTT 25 24 - 36 seconds    Comment: Performed at [facility] [address] [place], [place] [address]  Urinalysis, Routine w reflex microscopic     Status: Abnormal   Collection Time: 08/03/20  7:14 PM  Result Value Ref Range   Color, Urine YELLOW YELLOW   APPearance CLEAR CLEAR   Specific Gravity, Urine 1.027 1.005 - 1.030   pH 5.0 5.0 - 8.0   Glucose, UA >=500 (A) NEGATIVE mg/dL   Hgb urine dipstick NEGATIVE NEGATIVE    Bilirubin Urine NEGATIVE NEGATIVE   Ketones, ur 5 (A) NEGATIVE mg/dL  Protein, ur NEGATIVE NEGATIVE mg/dL   Nitrite NEGATIVE NEGATIVE   Leukocytes,Ua NEGATIVE NEGATIVE   WBC, UA 0-5 0 - 5 WBC/hpf   Bacteria, UA RARE (A) NONE SEEN   Mucus PRESENT     Comment: Performed at [facility], [place] [address] [place], [place] [address]    CT ABDOMEN PELVIS W CONTRAST  Result Date: 08/03/2020 CLINICAL DATA:  Acute right lower quadrant abdominal pain. EXAM: CT ABDOMEN AND PELVIS WITH CONTRAST TECHNIQUE: Multidetector CT imaging of the abdomen and pelvis was performed using the standard protocol following bolus administration of intravenous contrast. CONTRAST:  156mL OMNIPAQUE IOHEXOL 300 MG/ML  SOLN COMPARISON:  None. FINDINGS: Lower chest: No acute abnormality. Hepatobiliary: No focal liver abnormality is seen. No gallstones, gallbladder wall thickening, or biliary dilatation. Pancreas: Unremarkable. No pancreatic ductal dilatation or surrounding inflammatory changes. Spleen: Normal in size without focal abnormality. Adrenals/Urinary Tract: Adrenal glands appear normal. Bilateral parapelvic cysts are noted. No hydronephrosis or renal obstruction is noted. No renal or ureteral calculi are noted. Urinary bladder is unremarkable. Stomach/Bowel: Stomach is within normal limits. Appendix appears normal. No evidence of bowel wall thickening, distention, or inflammatory changes. Vascular/Lymphatic: Aortic atherosclerosis. No enlarged abdominal or pelvic lymph nodes. Reproductive: Prostate is unremarkable. Other: Small fat containing periumbilical hernia is noted. No ascites is noted. Musculoskeletal: No acute or significant osseous findings. IMPRESSION: Aortic atherosclerosis. Small fat containing periumbilical hernia. No acute abnormality seen in the abdomen or pelvis. Aortic Atherosclerosis (ICD10-I70.0). Electronically Signed   By: Marijo Conception M.D.   On: 08/03/2020 20:09   DG Chest Port 1 View  Result  Date: 08/03/2020 CLINICAL DATA:  Questionable sepsis, sudden onset abdominal pain of a cyst. Recent CABG surgery last month. Fever EXAM: PORTABLE CHEST 1 VIEW COMPARISON:  Chest x-ray 07/25/2020. CT chest 07/06/2020. FINDINGS: The heart size and mediastinal contours are unchanged. Sternotomy wires with associated cardiac surgical changes noted. No focal consolidation. No pulmonary edema. No pleural effusion. No pneumothorax. No acute osseous abnormality. IMPRESSION: No active disease. Electronically Signed   By: Iven Finn M.D.   On: 08/03/2020 17:32    ROS  Ten point RoS negative unless otherwise noted in HPI.   PE Blood pressure 137/67, pulse (!) 106, temperature 99.8 F (37.7 C), temperature source Oral, resp. rate 17, SpO2 98 %. Constitutional: NAD; conversant; no deformities Eyes: Moist conjunctiva; no lid lag; anicteric; PERRL Neck: Trachea midline; no thyromegaly Lungs: Normal respiratory effort; no tactile fremitus CV: RRR; no palpable thrills; no pitting edema GI: Abd soft but mildly distended; no palpable hepatosplenomegaly; expresses pain in bilateral lower quadrants but no associated tenderness; no peritonitis; easily reducible small umbilical hernia w/o overlying skin changes MSK:  no clubbing/cyanosis Psychiatric: Appropriate affect; alert and oriented x3 Lymphatic: No palpable cervical or axillary lymphadenopathy   Assessment/Plan: 75 y.o. male with CAD s/p CABG, DM2, HTN, HLD, prior COVID (11/2019), PE who presents with lower abdominal pain consulted for CT finding of small fat-containing umbilical hernia. His abdominal pain is unlikely to be related to his umbilical hernia, which is not incarcerated, is readily reducible, and nontender. He also does not appear to have diverticulitis, appendicitis, or other potentially surgical etiologies for his abdominal pain on CT scan. Suspect his pain is related to a viral syndrome, given his tachycardia and leukocytosis in the ED.   -  Does not need surgical intervention at present - Agree with continued sepsis workup given tachycardia and leukocytosis   Discussed with Dr. Kieth Brightly.  [place]ordale Manera 08/03/2020, 8:59 PM

## 2020-08-03 NOTE — ED Provider Notes (Incomplete)
[facility] EMERGENCY DEPARTMENT Provider Note   CSN: [id number] Arrival date & time: 08/03/20  1617     History Chief Complaint  Patient presents with  . Abdominal Pain  . Emesis    Patrick Miranda is a 75 y.o. male.  HPI      75yo male with history of Type II Dm, PE on anticoagulation, CAD, COVID 07 Dec 2019, hypertension, CABG 06/2020   Past Medical History:  Diagnosis Date  . Arthritis   . 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 Cancer (HCC)    Skin  . Cerebral artery disease   . Coronary artery disease   . COVID-07 Dec 2019  . Depression   . Essential hypertension   . Hernia, umbilical   . HOH (hard of hearing)   . Hyperlipidemia   . OSA on CPAP   . Pulmonary emboli [facility])    May 2021  . Type 2 diabetes mellitus [facility])     Patient Active Problem List   Diagnosis Date Noted  . Postop check 07/25/2020  . Hx of CABG 07/09/2020  . CAD in native artery 07/04/2020  . Abnormal nuclear stress test   . COVID-19 virus infection 12/02/2019  . Multiple subsegmental pulmonary emboli without acute cor pulmonale ([place])   . New onset a-fib ([place])   . NSTEMI (non-ST elevated myocardial infarction) ([place])   . Hypersomnia 08/30/2019  . History of adenomatous polyp of colon 05/06/2018  . Vasomotor rhinitis 07/18/2017  . Arthritis of knee, right 01/06/2014  . Brainstem stroke ([place]) 09/07/2011    Class: Acute  . Dysarthria 09/05/2011    Class: Acute  . Chronic kidney disease (CKD), stage II (mild) 09/05/2011    Class: Chronic  . Hypertension 09/05/2011    Class: Chronic  . DM (diabetes mellitus), type 2 ([place]) 09/05/2011    Class: Chronic  . Hyperlipidemia 09/05/2011    Class: Chronic  . Obstructive sleep apnea 09/05/2011    Class: Chronic  . Anemia 09/05/2011    Class: Chronic    Past Surgical History:  Procedure Laterality Date  . COLONOSCOPY W/ POLYPECTOMY    . CORONARY ARTERY BYPASS GRAFT N/A 07/09/2020   Procedure: CORONARY ARTERY  BYPASS GRAFTING (CABG) TIMES THREE , USING LEFT INTERNAL MAMMARY ARTERY AND RIGHT LEG GREATER SAPHENOUS VEIN HARVESTED ENDOSCOPICALLY;  Surgeon: Ivin Poot, MD;  Location: [place];  Service: Open Heart Surgery;  Laterality: N/A;  . LEFT HEART CATH AND CORONARY ANGIOGRAPHY N/A 07/04/2020   Procedure: LEFT HEART CATH AND CORONARY ANGIOGRAPHY;  Surgeon: Troy Sine, MD;  Location: [place] [facility];  Service: Cardiovascular;  Laterality: N/A;  . TEE WITHOUT CARDIOVERSION N/A 07/09/2020   Procedure: TRANSESOPHAGEAL ECHOCARDIOGRAM (TEE);  Surgeon: Prescott Gum, Collier Salina, MD;  Location: [place];  Service: Open Heart Surgery;  Laterality: N/A;  . TOTAL KNEE ARTHROPLASTY Left 04/22/2013   Procedure: TOTAL KNEE ARTHROPLASTY- left;  Surgeon: Augustin Schooling, MD;  Location: [place];  Service: Orthopedics;  Laterality: Left;  with femoral block  . TOTAL KNEE ARTHROPLASTY Right 01/06/2014   Procedure: RIGHT TOTAL KNEE ARTHROPLASTY;  Surgeon: Augustin Schooling, MD;  Location: [place];  Service: Orthopedics;  Laterality: Right;       Family History  Problem Relation Age of Onset  . CAD Brother        Premature    Social History   Tobacco Use  . Smoking status: Never Smoker  . Smokeless tobacco: Never Used  Substance Use Topics  . Alcohol use: No  . Drug use: No    Home Medications Prior to Admission medications   Medication Sig Start Date End Date Taking? Authorizing Provider  aspirin EC 325 MG EC tablet Take 1 tablet (325 mg total) by mouth daily. 07/16/20   Gold, Wilder Glade, PA-C  BD PEN NEEDLE NANO U/F 32G X 4 MM MISC 1 each by Other route daily.  03/19/18   [provider]  Empagliflozin-metFORMIN HCl 11-998 MG TABS Take 1 tablet by mouth 2 (two) times daily.    [provider]  fenofibrate 160 MG tablet Take 160 mg by mouth daily.    [provider]  ferrous [id number] C-folic acid (TRINSICON / FOLTRIN) capsule Take 1 capsule by mouth 2 (two) times daily after a  meal. 07/16/20   Gold, Wilder Glade, PA-C  HUMALOG KWIKPEN 100 UNIT/ML KwikPen Inject 10 Units into the skin with breakfast, with lunch, and with evening meal.  04/27/18   [provider]  methylphenidate (RITALIN) 5 MG tablet Take 5 mg by mouth once a week. On Sunday mornings    [provider]  metoprolol tartrate (LOPRESSOR) 25 MG tablet Take 1 tablet (25 mg total) by mouth 2 (two) times daily. 07/16/20   Gold, Wilder Glade, PA-C  ONETOUCH VERIO test strip 1 each by Other route in the morning, at noon, and at bedtime.  03/20/18   [provider]  rosuvastatin (CRESTOR) 20 MG tablet Take 1 tablet (20 mg total) by mouth daily. 07/16/20   Gold, Wayne E, PA-C  TOUJEO SOLOSTAR 300 UNIT/ML SOPN Inject 30 Units into the skin daily.  06/25/18   [provider]  traMADol (ULTRAM) 50 MG tablet Take 1 tablet (50 mg total) by mouth every 6 (six) hours as needed for moderate pain. 07/16/20   John Giovanni, PA-C    Allergies    Lipitor [atorvastatin]  Review of Systems   Review of Systems  Physical Exam Updated Vital Signs BP 132/78   Pulse (!) 123   Temp (!) 101 F (38.3 C) (Oral)   Resp (!) 22   SpO2 98%   Physical Exam  ED Results / Procedures / Treatments   Labs (all labs ordered are listed, but only abnormal results are displayed) Labs Reviewed - No data to display  EKG None  Radiology No results found.  Procedures Procedures (including critical care time)  Medications Ordered in ED Medications - No data to display  ED Course  I have reviewed the triage vital signs and the nursing notes.  Pertinent labs & imaging results that were available during my care of the patient were reviewed by me and considered in my medical decision making (see chart for details).    MDM Rules/Calculators/A&P                          *** Final Clinical Impression(s) / ED Diagnoses Final diagnoses:  None    Rx / DC Orders ED Discharge Orders    None

## 2020-08-03 NOTE — ED Triage Notes (Signed)
Pt c.o sudden onset abd pain and emesis that started today. Recent CABG surgery last month. Called his doctor and they referred him here for possible dissection study. Pt actively vomiting in triage. Denies chest pain or back pain.

## 2020-08-03 NOTE — H&P (Signed)
History and Physical    Avner Stroder Mongillo AUQ:[id number] DOB: [birth year] DOA: 08/03/2020  PCP: Burnard Bunting, MD  Patient coming from: Home   Chief Complaint:  Chief Complaint  Patient presents with  . Abdominal Pain  . Emesis     HPI:    75 year old male with past medical history of coronary artery disease (S/P CABG 07/09/2020), obstructive sleep apnea, paroxysmal atrial fibrillation, insulin-dependent diabetes mellitus type 2, hypertension, hyperlipidemia chronic kidney disease stage IIIa who presents to [facility] emergency department with complaints of nausea vomiting and abdominal pain.  Of note, she was recently hospitalized at [facility] from 12/15 until 12/27.  Patient was hospitalized for an abnormal nuclear stress test with follow-up left heart catheterization revealing severe three-vessel disease.  Patient underwent three-vessel CABG on 12/20 by Dr. Prescott Gum.  Postop course was complicated for several days by atrial fibrillation which was eventually managed with amiodarone.  Patient was discharged on 12/27.  Patient's outpatient postoperative course was complicated by excessive amounts of fatigue according to cardiothoracic surgery note on 1/5.  This fatigue seems to have dramatically improved after discontinuation of patient's amiodarone after the 1/5 visit.  Patient explains that this morning, shortly after eating a breakfast of fried egg, 31-day-old rice and a refrigerated already opened can of peaches he began to experience abdominal pain.  Patient describes the pain as located in the lower abdomen, sharp in quality, severe in intensity and radiating diffusely.  Shortly after the onset of patient's substantial pain patient also began to experience intense nausea.  He began to vomit, initially previously eaten food.  According to the patient and wife, the patient has vomited nonbilious nonbloody vomitus several times throughout the day.  Patient's symptoms  of abdominal pain nausea and vomiting continued to persist for several hours throughout the day.  Patient denies any associated fevers, diarrhea, sick contacts, chest pain, shortness of breath, recent travel or confirmed contact with COVID-19 infection.  Patient symptoms continue to persist until he eventually presented to [facility] emergency room for evaluation.  Upon evaluation in the emergency department, patient was found to be tachycardic and tachypneic with a fever of 101 F.  Patient was also found to exhibit a mild leukocytosis of 11.5.  Patient was also found to have thrombocytosis of 636 although this is somewhat improved compared to prior.  Due to patient's substantial abdominal pain upon arrival to the emergency department, case was discussed with Dr. Kieth Brightly with general surgery by the emergency department provider who stated that they would come by and evaluate the patient.  Due to patient's ongoing symptoms the hospitalist group is now been called to assess the patient for admission to the hospital.   Review of Systems:   Review of Systems  Gastrointestinal: Positive for abdominal pain, nausea and vomiting.  All other systems reviewed and are negative.   Past Medical History:  Diagnosis Date  . Arthritis   . 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 Cancer (HCC)    Skin  . Cerebral artery disease   . Coronary artery disease   . COVID-07 Dec 2019  . COVID-19 virus infection 12/02/2019  . Depression   . Essential hypertension   . Hernia, umbilical   . HOH (hard of hearing)   . Hyperlipidemia   . NSTEMI (non-ST elevated myocardial infarction) ([place])   . OSA on CPAP   . Pulmonary emboli [facility])    May 2021  . Type 2 diabetes  mellitus [facility])     Past Surgical History:  Procedure Laterality Date  . COLONOSCOPY W/ POLYPECTOMY    . CORONARY ARTERY BYPASS GRAFT N/A 07/09/2020   Procedure: CORONARY ARTERY BYPASS GRAFTING (CABG) TIMES THREE , USING LEFT INTERNAL  MAMMARY ARTERY AND RIGHT LEG GREATER SAPHENOUS VEIN HARVESTED ENDOSCOPICALLY;  Surgeon: Ivin Poot, MD;  Location: [place];  Service: Open Heart Surgery;  Laterality: N/A;  . LEFT HEART CATH AND CORONARY ANGIOGRAPHY N/A 07/04/2020   Procedure: LEFT HEART CATH AND CORONARY ANGIOGRAPHY;  Surgeon: Troy Sine, MD;  Location: [place] CV LAB;  Service: Cardiovascular;  Laterality: N/A;  . TEE WITHOUT CARDIOVERSION N/A 07/09/2020   Procedure: TRANSESOPHAGEAL ECHOCARDIOGRAM (TEE);  Surgeon: Prescott Gum, Collier Salina, MD;  Location: [place];  Service: Open Heart Surgery;  Laterality: N/A;  . TOTAL KNEE ARTHROPLASTY Left 04/22/2013   Procedure: TOTAL KNEE ARTHROPLASTY- left;  Surgeon: Augustin Schooling, MD;  Location: [place];  Service: Orthopedics;  Laterality: Left;  with femoral block  . TOTAL KNEE ARTHROPLASTY Right 01/06/2014   Procedure: RIGHT TOTAL KNEE ARTHROPLASTY;  Surgeon: Augustin Schooling, MD;  Location: [place];  Service: Orthopedics;  Laterality: Right;     reports that he has never smoked. He has never used smokeless tobacco. He reports that he does not drink alcohol and does not use drugs.  Allergies  Allergen Reactions  . Lipitor [Atorvastatin] Other (See Comments)    Muscle pain    Family History  Problem Relation Age of Onset  . CAD Brother        Premature     Prior to Admission medications   Medication Sig Start Date End Date Taking? Authorizing Provider  aspirin EC 325 MG EC tablet Take 1 tablet (325 mg total) by mouth daily. 07/16/20   Gold, Wilder Glade, PA-C  BD PEN NEEDLE NANO U/F 32G X 4 MM MISC 1 each by Other route daily.  03/19/18   [provider]  Empagliflozin-metFORMIN HCl 11-998 MG TABS Take 1 tablet by mouth 2 (two) times daily.    [provider]  Fe Fum-FA-B Cmp-C-Zn-Mg-Mn-Cu (FERROCITE PLUS) 106-1 MG TABS Take 1 tablet by mouth 2 (two) times daily with a meal. 07/16/20   [provider]  fenofibrate 160 MG tablet Take 160 mg by mouth daily.     [provider]  ferrous [id number] C-folic acid (TRINSICON / FOLTRIN) capsule Take 1 capsule by mouth 2 (two) times daily after a meal. 07/16/20   Gold, Wilder Glade, PA-C  HUMALOG KWIKPEN 100 UNIT/ML KwikPen Inject 10 Units into the skin with breakfast, with lunch, and with evening meal.  04/27/18   [provider]  methylphenidate (RITALIN) 5 MG tablet Take 5 mg by mouth once a week. On Sunday mornings    [provider]  metoprolol tartrate (LOPRESSOR) 25 MG tablet Take 1 tablet (25 mg total) by mouth 2 (two) times daily. 07/16/20   Gold, Wilder Glade, PA-C  ONETOUCH VERIO test strip 1 each by Other route in the morning, at noon, and at bedtime.  03/20/18   [provider]  rosuvastatin (CRESTOR) 20 MG tablet Take 1 tablet (20 mg total) by mouth daily. 07/16/20   Gold, Wayne E, PA-C  TOUJEO SOLOSTAR 300 UNIT/ML SOPN Inject 30 Units into the skin daily.  06/25/18   [provider]  traMADol (ULTRAM) 50 MG tablet Take 1 tablet (50 mg total) by mouth every 6 (six) hours as needed for moderate pain. 07/16/20  John Giovanni, [place]    Physical Exam: Vitals:   08/03/20 2130 08/03/20 2145 08/03/20 2200 08/03/20 2215  BP: 124/64 (!) 146/78 134/72 125/75  Pulse: 100 97 96 94  Resp: 19 (!) 29 (!) 21 18  Temp:      TempSrc:      SpO2: 97% 99% 97% 96%    Constitutional: Acute alert and oriented x3, patient is in distress due to abdominal pain Skin: no rashes, no lesions, poor skin turgor noted. Eyes: Pupils are equally reactive to light.  No evidence of scleral icterus or conjunctival pallor.  ENMT: Slightly dry mucous membranes noted.  Posterior pharynx clear of any exudate or lesions.   Neck: normal, supple, no masses, no thyromegaly.  No evidence of jugular venous distension.   Respiratory: clear to auscultation bilaterally, no wheezing, no crackles. Normal respiratory effort. No accessory muscle use.  Cardiovascular: Tachycardic rate with regular  rhythm.  No murmurs / rubs / gallops. No extremity edema. 2+ pedal pulses. No carotid bruits.  Chest:   Nontender without crepitus or deformity.   Back:   Nontender without crepitus or deformity. Abdomen: Notable generalized abdominal tenderness.  Abdomen is soft however.  Positive bowel sounds in all quadrants.  No evidence of intra-abdominal masses. Musculoskeletal: No joint deformity upper and lower extremities. Good ROM, no contractures. Normal muscle tone.  Neurologic: CN 2-12 grossly intact. Sensation intact.  Patient moving all 4 extremities spontaneously.  Patient is following all commands.  Patient is responsive to verbal stimuli.   Psychiatric: Patient exhibits normal mood with flat affect.  Patient seems to possess insight as to their current situation.     Labs on Admission: I have personally reviewed following labs and imaging studies -   CBC: Recent Labs  Lab 08/03/20 1756  WBC 11.5*  NEUTROABS 10.2*  HGB 10.0*  HCT 31.4*  MCV 91.8  PLT 295*   Basic Metabolic Panel: Recent Labs  Lab 08/03/20 1756  NA 140  K 4.3  CL 105  CO2 17*  GLUCOSE 212*  BUN 27*  CREATININE 1.51*  CALCIUM 8.6*   GFR: Estimated Creatinine Clearance: 45 mL/min (A) (by C-G formula based on SCr of 1.51 mg/dL (H)). Liver Function Tests: Recent Labs  Lab 08/03/20 1756  AST 62*  ALT 19  ALKPHOS 83  BILITOT 1.2  PROT 6.0*  ALBUMIN 3.0*   No results for input(s): LIPASE, AMYLASE in the last 168 hours. No results for input(s): AMMONIA in the last 168 hours. Coagulation Profile: Recent Labs  Lab 08/03/20 1756  INR 1.3*   Cardiac Enzymes: No results for input(s): CKTOTAL, CKMB, CKMBINDEX, TROPONINI in the last 168 hours. BNP (last 3 results) No results for input(s): PROBNP in the last 8760 hours. HbA1C: No results for input(s): HGBA1C in the last 72 hours. CBG: No results for input(s): GLUCAP in the last 168 hours. Lipid Profile: No results for input(s): CHOL, HDL, LDLCALC,  TRIG, CHOLHDL, LDLDIRECT in the last 72 hours. Thyroid Function Tests: No results for input(s): TSH, T4TOTAL, FREET4, T3FREE, THYROIDAB in the last 72 hours. Anemia Panel: No results for input(s): VITAMINB12, FOLATE, FERRITIN, TIBC, IRON, RETICCTPCT in the last 72 hours. Urine analysis:    Component Value Date/Time   COLORURINE YELLOW 08/03/2020 1914   APPEARANCEUR CLEAR 08/03/2020 1914   LABSPEC 1.027 08/03/2020 1914   PHURINE 5.0 08/03/2020 1914   GLUCOSEU >=500 (A) 08/03/2020 [place] NEGATIVE 08/03/2020 [place] NEGATIVE 08/03/2020 1914   KETONESUR 5 (  A) 08/03/2020 1914   PROTEINUR NEGATIVE 08/03/2020 1914   UROBILINOGEN 0.2 01/08/2014 1550   NITRITE NEGATIVE 08/03/2020 1914   LEUKOCYTESUR NEGATIVE 08/03/2020 1914    Radiological Exams on Admission - Personally Reviewed: CT ABDOMEN PELVIS W CONTRAST  Result Date: 08/03/2020 CLINICAL DATA:  Acute right lower quadrant abdominal pain. EXAM: CT ABDOMEN AND PELVIS WITH CONTRAST TECHNIQUE: Multidetector CT imaging of the abdomen and pelvis was performed using the standard protocol following bolus administration of intravenous contrast. CONTRAST:  158mL OMNIPAQUE IOHEXOL 300 MG/ML  SOLN COMPARISON:  None. FINDINGS: Lower chest: No acute abnormality. Hepatobiliary: No focal liver abnormality is seen. No gallstones, gallbladder wall thickening, or biliary dilatation. Pancreas: Unremarkable. No pancreatic ductal dilatation or surrounding inflammatory changes. Spleen: Normal in size without focal abnormality. Adrenals/Urinary Tract: Adrenal glands appear normal. Bilateral parapelvic cysts are noted. No hydronephrosis or renal obstruction is noted. No renal or ureteral calculi are noted. Urinary bladder is unremarkable. Stomach/Bowel: Stomach is within normal limits. Appendix appears normal. No evidence of bowel wall thickening, distention, or inflammatory changes. Vascular/Lymphatic: Aortic atherosclerosis. No enlarged abdominal or  pelvic lymph nodes. Reproductive: Prostate is unremarkable. Other: Small fat containing periumbilical hernia is noted. No ascites is noted. Musculoskeletal: No acute or significant osseous findings. IMPRESSION: Aortic atherosclerosis. Small fat containing periumbilical hernia. No acute abnormality seen in the abdomen or pelvis. Aortic Atherosclerosis (ICD10-I70.0). Electronically Signed   By: Marijo Conception M.D.   On: 08/03/2020 20:09   DG Chest Port 1 View  Result Date: 08/03/2020 CLINICAL DATA:  Questionable sepsis, sudden onset abdominal pain of a cyst. Recent CABG surgery last month. Fever EXAM: PORTABLE CHEST 1 VIEW COMPARISON:  Chest x-ray 07/25/2020. CT chest 07/06/2020. FINDINGS: The heart size and mediastinal contours are unchanged. Sternotomy wires with associated cardiac surgical changes noted. No focal consolidation. No pulmonary edema. No pleural effusion. No pneumothorax. No acute osseous abnormality. IMPRESSION: No active disease. Electronically Signed   By: Iven Finn M.D.   On: 08/03/2020 17:32    EKG: Personally reviewed.  Rhythm is sinus tachycardia with heart rate of 124 bpm.  Significant artifact noted on EKG but otherwise no obvious evidence of dynamic ST segment change.  Assessment/Plan Principal Problem:   Abdominal pain   Patient presenting with rather sudden onset of severe generalized abdominal pain almost immediately after eating 31-day-old reheated rice, fried egg and a several day old opened a can of peaches.  Patient presenting with multiple SIRS criteria including tachycardia, tachypnea, mild leukocytosis and fever.  On exam, abdomen is soft with notable bowel sounds.  CT imaging of the abdomen and pelvis revealed no obvious source of the patient's pain.  Urinalysis reveals no evidence of urinary tract infection  Based on my initial evaluation of the patient, I feel the patient's presentation is likely secondary to foodborne illness  Patient was already  initiated on an empiric regimen of intravenous Zosyn by the emergency department provider.   I will discontinue this antibiotic regimen for now as I believe patient symptoms may be self-limiting.  Blood cultures obtained.  Patient continues to spike fevers clinically worsens or patient exhibitss positive blood cultures we will restart antibiotics  Additionally obtaining troponin, EKG, inflammatory markers to ensure that patient's presentation is not complication of recent CABG performed on 12/20  Hydrating patient with intravenous isotonic fluids  As needed intravenous antiemetics  Clear liquid diet for now  Dr. Kieth Brightly with general surgery has graciously evaluated the patient already at the request of the ER and  does not believe patient possesses a surgical abdomen.  Active Problems:   Foodborne gastroenteritis   Please see assessment and plan above    SIRS (systemic inflammatory response syndrome) (HCC)   Please see assessment and plan above    AF (paroxysmal atrial fibrillation) (HCC)   Patient currently in sinus tachycardia  Monitoring patient on telemetry  Continuing home regimen of metoprolol 25 mg twice daily  Amiodarone was recently discontinued on 1/5 by cardiothoracic surgery  Patient is not on anticoagulation based on home medication reconciliation    Coronary artery disease involving native heart   Patient is status post three-vessel CABG on 12/08/2019  Modest thrombocytosis of 636 is likely related to this recent surgery  Patient's presentation is unlikely to be a post CABG complication although troponin, repeat EKG and inflammatory markers are pending    Chronic kidney disease, stage 3a (HCC)   Creatinine near baseline  Strict input and output monitoring  Avoiding nephrotoxic agents if at all possible  Monitoring renal function and electrolytes with serial chemistry    Mixed diabetic hyperlipidemia associated with type 2 diabetes mellitus  ([place])  . Continuing home regimen of lipid lowering therapy.    Uncontrolled type 2 diabetes mellitus with hyperglycemia, with long-term current use of insulin ([place]) . Patient been placed on Accu-Cheks before every meal and nightly with sliding scale insulin . Holding home regimen of oral hypoglycemics . Hemoglobin A1c performed in December was 8% . Placing patient on reduced regimen of basal insulin considering tenuous oral intake     Code Status:  Full code Family Communication: Wife is at bedside who has been updated on plan of care  Status is: Observation  The patient remains OBS appropriate and will d/c before 2 midnights.  Dispo: The patient is from: Home              Anticipated d/c is to: Home              Anticipated d/c date is: 2 days              Patient currently is not medically stable to d/c.        Vernelle Emerald MD [facility] Pager [phone]  If 7PM-7AM, please contact night-coverage [email or web address] Use universal [place] password for that web site. If you do not have the password, please call the hospital operator.  08/03/2020, 10:42 PM

## 2020-08-04 ENCOUNTER — Observation Stay (HOSPITAL_COMMUNITY): Payer: Medicare HMO

## 2020-08-04 ENCOUNTER — Other Ambulatory Visit: Payer: Self-pay

## 2020-08-04 DIAGNOSIS — Z888 Allergy status to other drugs, medicaments and biological substances status: Secondary | ICD-10-CM | POA: Diagnosis not present

## 2020-08-04 DIAGNOSIS — E1122 Type 2 diabetes mellitus with diabetic chronic kidney disease: Secondary | ICD-10-CM | POA: Diagnosis present

## 2020-08-04 DIAGNOSIS — I5020 Unspecified systolic (congestive) heart failure: Secondary | ICD-10-CM | POA: Diagnosis not present

## 2020-08-04 DIAGNOSIS — E1165 Type 2 diabetes mellitus with hyperglycemia: Secondary | ICD-10-CM | POA: Diagnosis present

## 2020-08-04 DIAGNOSIS — K439 Ventral hernia without obstruction or gangrene: Secondary | ICD-10-CM | POA: Diagnosis present

## 2020-08-04 DIAGNOSIS — I130 Hypertensive heart and chronic kidney disease with heart failure and stage 1 through stage 4 chronic kidney disease, or unspecified chronic kidney disease: Secondary | ICD-10-CM | POA: Diagnosis present

## 2020-08-04 DIAGNOSIS — I2510 Atherosclerotic heart disease of native coronary artery without angina pectoris: Secondary | ICD-10-CM | POA: Diagnosis present

## 2020-08-04 DIAGNOSIS — G4733 Obstructive sleep apnea (adult) (pediatric): Secondary | ICD-10-CM | POA: Diagnosis present

## 2020-08-04 DIAGNOSIS — I5022 Chronic systolic (congestive) heart failure: Secondary | ICD-10-CM | POA: Diagnosis present

## 2020-08-04 DIAGNOSIS — I10 Essential (primary) hypertension: Secondary | ICD-10-CM | POA: Diagnosis not present

## 2020-08-04 DIAGNOSIS — A059 Bacterial foodborne intoxication, unspecified: Secondary | ICD-10-CM | POA: Diagnosis present

## 2020-08-04 DIAGNOSIS — R609 Edema, unspecified: Secondary | ICD-10-CM | POA: Diagnosis not present

## 2020-08-04 DIAGNOSIS — I253 Aneurysm of heart: Secondary | ICD-10-CM | POA: Diagnosis present

## 2020-08-04 DIAGNOSIS — Z794 Long term (current) use of insulin: Secondary | ICD-10-CM | POA: Diagnosis not present

## 2020-08-04 DIAGNOSIS — I255 Ischemic cardiomyopathy: Secondary | ICD-10-CM | POA: Diagnosis present

## 2020-08-04 DIAGNOSIS — K567 Ileus, unspecified: Secondary | ICD-10-CM | POA: Diagnosis not present

## 2020-08-04 DIAGNOSIS — Z20822 Contact with and (suspected) exposure to covid-19: Secondary | ICD-10-CM | POA: Diagnosis present

## 2020-08-04 DIAGNOSIS — R339 Retention of urine, unspecified: Secondary | ICD-10-CM | POA: Diagnosis not present

## 2020-08-04 DIAGNOSIS — I700 Atherosclerosis of aorta: Secondary | ICD-10-CM | POA: Diagnosis present

## 2020-08-04 DIAGNOSIS — Z951 Presence of aortocoronary bypass graft: Secondary | ICD-10-CM | POA: Diagnosis not present

## 2020-08-04 DIAGNOSIS — I351 Nonrheumatic aortic (valve) insufficiency: Secondary | ICD-10-CM | POA: Diagnosis not present

## 2020-08-04 DIAGNOSIS — R109 Unspecified abdominal pain: Secondary | ICD-10-CM | POA: Diagnosis not present

## 2020-08-04 DIAGNOSIS — E785 Hyperlipidemia, unspecified: Secondary | ICD-10-CM | POA: Diagnosis present

## 2020-08-04 DIAGNOSIS — N1831 Chronic kidney disease, stage 3a: Secondary | ICD-10-CM | POA: Diagnosis present

## 2020-08-04 DIAGNOSIS — Z8616 Personal history of COVID-19: Secondary | ICD-10-CM | POA: Diagnosis not present

## 2020-08-04 DIAGNOSIS — E1169 Type 2 diabetes mellitus with other specified complication: Secondary | ICD-10-CM | POA: Diagnosis present

## 2020-08-04 DIAGNOSIS — R509 Fever, unspecified: Secondary | ICD-10-CM | POA: Diagnosis not present

## 2020-08-04 DIAGNOSIS — I513 Intracardiac thrombosis, not elsewhere classified: Secondary | ICD-10-CM | POA: Diagnosis present

## 2020-08-04 DIAGNOSIS — D75839 Thrombocytosis, unspecified: Secondary | ICD-10-CM | POA: Diagnosis present

## 2020-08-04 DIAGNOSIS — I480 Paroxysmal atrial fibrillation: Secondary | ICD-10-CM | POA: Diagnosis present

## 2020-08-04 LAB — COMPREHENSIVE METABOLIC PANEL
ALT: 17 U/L (ref 0–44)
AST: 46 U/L — ABNORMAL HIGH (ref 15–41)
Albumin: 2.7 g/dL — ABNORMAL LOW (ref 3.5–5.0)
Alkaline Phosphatase: 65 U/L (ref 38–126)
Anion gap: 14 (ref 5–15)
BUN: 27 mg/dL — ABNORMAL HIGH (ref 8–23)
CO2: 19 mmol/L — ABNORMAL LOW (ref 22–32)
Calcium: 8.4 mg/dL — ABNORMAL LOW (ref 8.9–10.3)
Chloride: 105 mmol/L (ref 98–111)
Creatinine, Ser: 1.58 mg/dL — ABNORMAL HIGH (ref 0.61–1.24)
GFR, Estimated: 45 mL/min — ABNORMAL LOW (ref >60)
Glucose, Bld: 168 mg/dL — ABNORMAL HIGH (ref 70–99)
Potassium: 4.1 mmol/L (ref 3.5–5.1)
Sodium: 138 mmol/L (ref 135–145)
Total Bilirubin: 1 mg/dL (ref 0.3–1.2)
Total Protein: 5.8 g/dL — ABNORMAL LOW (ref 6.5–8.1)

## 2020-08-04 LAB — CBC WITH DIFFERENTIAL/PLATELET
Abs Immature Granulocytes: 0.05 10*3/uL (ref 0.00–0.07)
Basophils Absolute: 0 10*3/uL (ref 0.0–0.1)
Basophils Relative: 0 %
Eosinophils Absolute: 0 10*3/uL (ref 0.0–0.5)
Eosinophils Relative: 0 %
HCT: 28.7 % — ABNORMAL LOW (ref 39.0–52.0)
Hemoglobin: 9.2 g/dL — ABNORMAL LOW (ref 13.0–17.0)
Immature Granulocytes: 1 %
Lymphocytes Relative: 4 %
Lymphs Abs: 0.4 10*3/uL — ABNORMAL LOW (ref 0.7–4.0)
MCH: 29.4 pg (ref 26.0–34.0)
MCHC: 32.1 g/dL (ref 30.0–36.0)
MCV: 91.7 fL (ref 80.0–100.0)
Monocytes Absolute: 1.1 10*3/uL — ABNORMAL HIGH (ref 0.1–1.0)
Monocytes Relative: 11 %
Neutro Abs: 8.9 10*3/uL — ABNORMAL HIGH (ref 1.7–7.7)
Neutrophils Relative %: 84 %
Platelets: 539 10*3/uL — ABNORMAL HIGH (ref 150–400)
RBC: 3.13 MIL/uL — ABNORMAL LOW (ref 4.22–5.81)
RDW: 16.1 % — ABNORMAL HIGH (ref 11.5–15.5)
WBC Morphology: INCREASED
WBC: 10.6 10*3/uL — ABNORMAL HIGH (ref 4.0–10.5)
nRBC: 0 % (ref 0.0–0.2)

## 2020-08-04 LAB — C-REACTIVE PROTEIN: CRP: 11.1 mg/dL — ABNORMAL HIGH (ref <1.0)

## 2020-08-04 LAB — PHOSPHORUS: Phosphorus: 4.6 mg/dL (ref 2.5–4.6)

## 2020-08-04 LAB — MAGNESIUM: Magnesium: 1.7 mg/dL (ref 1.7–2.4)

## 2020-08-04 LAB — LACTIC ACID, PLASMA: Lactic Acid, Venous: 1.4 mmol/L (ref 0.5–1.9)

## 2020-08-04 LAB — TROPONIN I (HIGH SENSITIVITY): Troponin I (High Sensitivity): 44 ng/L — ABNORMAL HIGH (ref <18)

## 2020-08-04 LAB — GLUCOSE, CAPILLARY
Glucose-Capillary: 413 mg/dL — ABNORMAL HIGH (ref 70–99)
Glucose-Capillary: 144 mg/dL — ABNORMAL HIGH (ref 70–99)
Glucose-Capillary: 148 mg/dL — ABNORMAL HIGH (ref 70–99)
Glucose-Capillary: 151 mg/dL — ABNORMAL HIGH (ref 70–99)
Glucose-Capillary: 203 mg/dL — ABNORMAL HIGH (ref 70–99)

## 2020-08-04 LAB — SEDIMENTATION RATE: Sed Rate: 25 mm/hr — ABNORMAL HIGH (ref 0–16)

## 2020-08-04 LAB — BRAIN NATRIURETIC PEPTIDE: B Natriuretic Peptide: 656.9 pg/mL — ABNORMAL HIGH (ref 0.0–100.0)

## 2020-08-04 MED ORDER — INSULIN ASPART 100 UNIT/ML ~~LOC~~ SOLN
15.0000 [IU] | Freq: Once | SUBCUTANEOUS | Status: AC
  Administered 2020-08-04: 15 [IU] via SUBCUTANEOUS

## 2020-08-04 MED ORDER — CHLORHEXIDINE GLUCONATE CLOTH 2 % EX PADS
6.0000 | MEDICATED_PAD | Freq: Every day | CUTANEOUS | Status: DC
  Administered 2020-08-04 – 2020-08-10 (×5): 6 via TOPICAL

## 2020-08-04 MED ORDER — TAMSULOSIN HCL 0.4 MG PO CAPS
0.4000 mg | ORAL_CAPSULE | Freq: Every day | ORAL | Status: DC
  Administered 2020-08-04 – 2020-08-10 (×7): 0.4 mg via ORAL
  Filled 2020-08-04 (×7): qty 1

## 2020-08-04 MED ORDER — SODIUM CHLORIDE 0.9 % IV SOLN
INTRAVENOUS | Status: DC
  Administered 2020-08-05: 45 mL/h via INTRAVENOUS

## 2020-08-04 NOTE — Progress Notes (Signed)
Patient ID: Patrick Miranda, male   DOB: 06-13-1946, 75 y.o.   MRN: [id number]   Acute Care Surgery Service Progress Note:    Chief Complaint/Subjective: Wife at [facility] He reports some more distension today and a little more discomfort. No BM today. Last BM yesterday. No flatus today  Objective: Vital signs in last 24 hours: Temp:  [98 F (36.7 C)-103 F (39.4 C)] 99.4 F (37.4 C) (01/15 1109) Pulse Rate:  [91-123] 91 (01/15 1109) Resp:  [10-29] 18 (01/15 1109) BP: (105-160)/(54-80) 108/58 (01/15 1109) SpO2:  [92 %-99 %] 92 % (01/15 1109)    Intake/Output from previous day: 01/14 0701 - 01/15 0700 In: 1430 [P.O.:380; IV Piggyback:1050] Out: 370 [Urine:370] Intake/Output this shift: Total I/O In: 860.9 [I.V.:860.9] Out: 150 [Urine:150]  Lungs: cta, nonlabored  Cardiovascular: reg  Abd: soft, distended, reducible umb hernia, mild TTP  Extremities: no edema, +SCDs  Neuro: alert, nonfocal  Lab Results: CBC  Recent Labs    08/03/20 1756 08/04/20 0237  WBC 11.5* 10.6*  HGB 10.0* 9.2*  HCT 31.4* 28.7*  PLT 636* 539*   BMET Recent Labs    08/03/20 1756 08/04/20 0237  NA 140 138  K 4.3 4.1  CL 105 105  CO2 17* 19*  GLUCOSE 212* 168*  BUN 27* 27*  CREATININE 1.51* 1.58*  CALCIUM 8.6* 8.4*   LFT Hepatic Function Latest Ref Rng & Units 08/04/2020 08/03/2020 07/05/2020  Total Protein 6.5 - 8.1 g/dL 5.8(L) 6.0(L) 5.3(L)  Albumin 3.5 - 5.0 g/dL 2.7(L) 3.0(L) 2.9(L)  AST 15 - 41 U/L 46(H) 62(H) 18  ALT 0 - 44 U/L 17 19 10   Alk Phosphatase 38 - 126 U/L 65 83 44  Total Bilirubin 0.3 - 1.2 mg/dL 1.0 1.2 0.6   PT/INR Recent Labs    08/03/20 1756  LABPROT 15.2  INR 1.3*   ABG No results for input(s): PHART, HCO3 in the last 72 hours.  Invalid input(s): PCO2, PO2  Studies/Results:  Anti-infectives: Anti-infectives (From admission, onward)   Start     Dose/Rate Route Frequency Ordered Stop   08/03/20 2330  piperacillin-tazobactam (ZOSYN) IVPB 3.375 g   Status:  Discontinued       "Followed by" Linked Group Details   3.375 g 12.5 mL/hr over 240 Minutes Intravenous Every 8 hours 08/03/20 1715 08/03/20 2223   08/03/20 1730  piperacillin-tazobactam (ZOSYN) IVPB 3.375 g       "Followed by" Linked Group Details   3.375 g 100 mL/hr over 30 Minutes Intravenous  Once 08/03/20 1715 08/03/20 2002      Medications: Scheduled Meds: . aspirin  325 mg Oral Daily  . enoxaparin (LOVENOX) injection  40 mg Subcutaneous Q24H  . fenofibrate  160 mg Oral Daily  . insulin aspart  0-15 Units Subcutaneous TID AC & HS  . insulin glargine  20 Units Subcutaneous Daily  . metoprolol tartrate  25 mg Oral BID  . rosuvastatin  20 mg Oral Daily   Continuous Infusions: PRN Meds:.acetaminophen **OR** acetaminophen, morphine injection **OR** oxyCODONE-acetaminophen, ondansetron **OR** ondansetron (ZOFRAN) IV, polyethylene glycol  Assessment/Plan: Patient Active Problem List   Diagnosis Date Noted  . Abdominal pain 08/03/2020  . Chronic kidney disease, stage 3a ([place]) 08/03/2020  . Mixed diabetic hyperlipidemia associated with type 2 diabetes mellitus ([place]) 08/03/2020  . Uncontrolled type 2 diabetes mellitus with hyperglycemia, with long-term current use of insulin ([place]) 08/03/2020  . Foodborne gastroenteritis 08/03/2020  . SIRS (systemic inflammatory response syndrome) ([place]) 08/03/2020  . Postop check  07/25/2020  . Hx of CABG 07/09/2020  . Coronary artery disease involving native heart 07/04/2020  . Abnormal nuclear stress test   . Multiple subsegmental pulmonary emboli without acute cor pulmonale (HCC)   . AF (paroxysmal atrial fibrillation) ([place])   . Hypersomnia 08/30/2019  . History of adenomatous polyp of colon 05/06/2018  . Vasomotor rhinitis 07/18/2017  . Arthritis of knee, right 01/06/2014  . Brainstem stroke ([place]) 09/07/2011    Class: Acute  . Dysarthria 09/05/2011    Class: Acute  . Essential hypertension 09/05/2011    Class: Chronic  . OSA on  CPAP 09/05/2011    Class: Chronic  . Anemia 09/05/2011    Class: Chronic   Abdominal distension Likely ileus from recent gastroenteritis Reducible fat containing umbilical hernia Not sure of source of fever  I reviewed ct and abd xray. Ct was negative except fat umb hernia.   Bowel rest, IVF Monitor lytes If starts vomiting - place NG tube Disposition:  LOS: 0 days    Leighton Ruff. Redmond Pulling, MD, FACS General, Bariatric, & Minimally Invasive Surgery [phone] [facility]

## 2020-08-04 NOTE — Progress Notes (Signed)
Bladder scan completed. 518 ml in bladder. Asked patient to void, he only voided 50cc. MD made aware via secure chat.

## 2020-08-04 NOTE — Progress Notes (Signed)
PROGRESS NOTE    Patrick Miranda  [id number] DOB: [birth year] DOA: 08/03/2020 PCP: Burnard Bunting, MD   Brief Narrative: 75 year old with past medical history significant for CAD status post CABG 06/2020, obstructive sleep apnea, paroxysmal A. fib, insulin-dependent diabetes type 2, hypertension, chronic kidney disease stage IIIa who presents to [facility] complaining of nausea vomiting and abdominal pain.  Patient explains that the morning of admission after eating breakfast fried egg, 32 days old rice and refrigerated already open can of peaches he began experience abdominal pain.  Symptoms accompanied by multiple episodes of vomiting.  Symptoms persisted and eventually patient presented to [facility].  On evaluation patient was found to be tachycardic, tachypneic with a fever of 101.  Mild leukocytosis 11.5.  Assessment & Plan:   Principal Problem:   Abdominal pain Active Problems:   Essential hypertension   AF (paroxysmal atrial fibrillation) (HCC)   Coronary artery disease involving native heart   Chronic kidney disease, stage 3a (HCC)   Mixed diabetic hyperlipidemia associated with type 2 diabetes mellitus ([place])   Uncontrolled type 2 diabetes mellitus with hyperglycemia, with long-term current use of insulin (HCC)   Foodborne gastroenteritis   SIRS (systemic inflammatory response syndrome) (HCC)  1-Abdominal Pain, Nausea, Vomiting:  -?  Foodborne illness, but wife report patient was not feeling well on Thursday day prior events.  -CT abdomen Pelvis: Aortic atherosclerosis. Small fat containing periumbilical hernia. No acute abnormalities.  -patient with persistent abdominal pain, more distended today. Plan to get KUB.  -KUB with ileus vs SBO. I asked surgery to continue to follow on patient.  -keep on clear diet.    SIRS;  Could be related to gastroenteritis.  Follow Blood culture, urine culture.  Report Diarrhea on admission, plan to get C diff  and GI pathogen.  Monitor off IV antibiotics.  If spike high fever will start antibiotics.   A fib; Continue with metoprolol.   CAD S/P CABG;  Had CABG 06/2020. Follow up with Dr Nils Pyle.  Will inform CV[facility] of admission.   CKD stage 3 a; Cr range 1.4---1.6 Remain stable.  Monitor.   Diabetes type 2;  Continue with lantus and SSI>   Thrombocytosis;  Continue with aspirin.  Lower than 2 weeks ago, platelet count at that time was 1,004  Mild elevation; denies chest pain.     Estimated body mass index is 23.71 kg/m as calculated from the following:   Height as of 08/01/20: 5\' 11"  (1.803 m).   Weight as of 08/01/20: 77.1 kg.   DVT prophylaxis: Lovenox Code Status: Full code Family Communication: Wife who was at bedside.  Disposition Plan:  Status is: Observation  The patient remains OBS appropriate and will d/c before 2 midnights.  Dispo: The patient is from: Home              Anticipated d/c is to: Home              Anticipated d/c date is: 2 days              Patient currently is not medically stable to d/c.        Consultants:   Surgery   Procedures:   none  Antimicrobials:    Subjective: He is still complaining of abdominal pain, lower quadrant. No more vomiting.  Feels abdomen is more distended today.   Objective: Vitals:   08/03/20 2215 08/03/20 2300 08/04/20 0435 08/04/20 0600  BP: 125/75 (!) 138/58 (!) 136/59 119/60  Pulse: 94 96 (!) 102 (!) 101  Resp: 18     Temp:  98 F (36.7 C) (!) 103 F (39.4 C) (!) 100.6 F (38.1 C)  TempSrc:  Oral Oral Oral  SpO2: 96% 96% 95% 92%    Intake/Output Summary (Last 24 hours) at 08/04/2020 0754 Last data filed at 08/04/2020 0434 Gross per 24 hour  Intake 1430 ml  Output 370 ml  Net 1060 ml   There were no vitals filed for this visit.  Examination:  General exam: Appears calm and comfortable  Respiratory system: Clear to auscultation. Respiratory effort normal. Cardiovascular system: S1 &  S2 heard, RRR. No JVD, murmurs, rubs, gallops or clicks. No pedal edema. Gastrointestinal system: Abdomen is distended, soft and tender. No organomegaly or masses felt. Normal bowel sounds heard. Central nervous system: Alert and oriented. Extremities: Symmetric 5 x 5 power.    Data Reviewed: I have personally reviewed following labs and imaging studies  CBC: Recent Labs  Lab 08/03/20 1756 08/04/20 0237  WBC 11.5* 10.6*  NEUTROABS 10.2* 8.9*  HGB 10.0* 9.2*  HCT 31.4* 28.7*  MCV 91.8 91.7  PLT 636* [id number]*   Basic Metabolic Panel: Recent Labs  Lab 08/03/20 1756 08/04/20 0237  NA 140 138  K 4.3 4.1  CL 105 105  CO2 17* 19*  GLUCOSE 212* 168*  BUN 27* 27*  CREATININE 1.51* 1.58*  CALCIUM 8.6* 8.4*  MG  --  1.7  PHOS  --  4.6   GFR: Estimated Creatinine Clearance: 43 mL/min (A) (by C-G formula based on SCr of 1.58 mg/dL (H)). Liver Function Tests: Recent Labs  Lab 08/03/20 1756 08/04/20 0237  AST 62* 46*  ALT 19 17  ALKPHOS 83 65  BILITOT 1.2 1.0  PROT 6.0* 5.8*  ALBUMIN 3.0* 2.7*   No results for input(s): LIPASE, AMYLASE in the last 168 hours. No results for input(s): AMMONIA in the last 168 hours. Coagulation Profile: Recent Labs  Lab 08/03/20 1756  INR 1.3*   Cardiac Enzymes: No results for input(s): CKTOTAL, CKMB, CKMBINDEX, TROPONINI in the last 168 hours. BNP (last 3 results) No results for input(s): PROBNP in the last 8760 hours. HbA1C: No results for input(s): HGBA1C in the last 72 hours. CBG: Recent Labs  Lab 08/04/20 0538  GLUCAP 413*   Lipid Profile: No results for input(s): CHOL, HDL, LDLCALC, TRIG, CHOLHDL, LDLDIRECT in the last 72 hours. Thyroid Function Tests: No results for input(s): TSH, T4TOTAL, FREET4, T3FREE, THYROIDAB in the last 72 hours. Anemia Panel: No results for input(s): VITAMINB12, FOLATE, FERRITIN, TIBC, IRON, RETICCTPCT in the last 72 hours. Sepsis Labs: Recent Labs  Lab 08/03/20 1756 08/04/20 0237   LATICACIDVEN 1.8 1.4    Recent Results (from the past 240 hour(s))  Resp Panel by RT-PCR (Flu A&B, Covid)     Status: None   Collection Time: 08/03/20  7:14 PM   Specimen: Nasopharyngeal(NP) swabs in vial transport medium  Result Value Ref Range Status   SARS Coronavirus 2 by RT PCR NEGATIVE NEGATIVE Final    Comment: (NOTE) SARS-CoV-2 target nucleic acids are NOT DETECTED.  The SARS-CoV-2 RNA is generally detectable in upper respiratory specimens during the acute phase of infection. The lowest concentration of SARS-CoV-2 viral copies this assay can detect is 138 copies/mL. A negative result does not preclude SARS-Cov-2 infection and should not be used as the sole basis for treatment or other patient management decisions. A negative result may occur with  improper specimen collection/handling, submission of  specimen other than nasopharyngeal swab, presence of viral mutation(s) within the areas targeted by this assay, and inadequate number of viral copies(<138 copies/mL). A negative result must be combined with clinical observations, patient history, and epidemiological information. The expected result is Negative.  Fact Sheet for Patients:  EntrepreneurPulse.com.au  Fact Sheet for Healthcare Providers:  IncredibleEmployment.be  This test is no t yet approved or cleared by the [place] FDA and  has been authorized for detection and/or diagnosis of SARS-CoV-2 by FDA under an Emergency Use Authorization (EUA). This EUA will remain  in effect (meaning this test can be used) for the duration of the COVID-19 declaration under Section 564(b)(1) of the Act, 21 U.S.C.section 360bbb-3(b)(1), unless the authorization is terminated  or revoked sooner.       Influenza A by PCR NEGATIVE NEGATIVE Final   Influenza B by PCR NEGATIVE NEGATIVE Final    Comment: (NOTE) The Xpert Xpress SARS-CoV-2/FLU/RSV plus assay is intended as an aid in the  diagnosis of influenza from Nasopharyngeal swab specimens and should not be used as a sole basis for treatment. Nasal washings and aspirates are unacceptable for Xpert Xpress SARS-CoV-2/FLU/RSV testing.  Fact Sheet for Patients: EntrepreneurPulse.com.au  Fact Sheet for Healthcare Providers: IncredibleEmployment.be  This test is not yet approved or cleared by the [place] FDA and has been authorized for detection and/or diagnosis of SARS-CoV-2 by FDA under an Emergency Use Authorization (EUA). This EUA will remain in effect (meaning this test can be used) for the duration of the COVID-19 declaration under Section 564(b)(1) of the Act, 21 U.S.C. section 360bbb-3(b)(1), unless the authorization is terminated or revoked.  Performed at [facility], [place] [address], [place], [place] [address]          Radiology Studies: CT ABDOMEN PELVIS W CONTRAST  Result Date: 08/03/2020 CLINICAL DATA:  Acute right lower quadrant abdominal pain. EXAM: CT ABDOMEN AND PELVIS WITH CONTRAST TECHNIQUE: Multidetector CT imaging of the abdomen and pelvis was performed using the standard protocol following bolus administration of intravenous contrast. CONTRAST:  122mL OMNIPAQUE IOHEXOL 300 MG/ML  SOLN COMPARISON:  None. FINDINGS: Lower chest: No acute abnormality. Hepatobiliary: No focal liver abnormality is seen. No gallstones, gallbladder wall thickening, or biliary dilatation. Pancreas: Unremarkable. No pancreatic ductal dilatation or surrounding inflammatory changes. Spleen: Normal in size without focal abnormality. Adrenals/Urinary Tract: Adrenal glands appear normal. Bilateral parapelvic cysts are noted. No hydronephrosis or renal obstruction is noted. No renal or ureteral calculi are noted. Urinary bladder is unremarkable. Stomach/Bowel: Stomach is within normal limits. Appendix appears normal. No evidence of bowel wall thickening, distention, or inflammatory  changes. Vascular/Lymphatic: Aortic atherosclerosis. No enlarged abdominal or pelvic lymph nodes. Reproductive: Prostate is unremarkable. Other: Small fat containing periumbilical hernia is noted. No ascites is noted. Musculoskeletal: No acute or significant osseous findings. IMPRESSION: Aortic atherosclerosis. Small fat containing periumbilical hernia. No acute abnormality seen in the abdomen or pelvis. Aortic Atherosclerosis (ICD10-I70.0). Electronically Signed   By: Marijo Conception M.D.   On: 08/03/2020 20:09   DG Chest Port 1 View  Result Date: 08/03/2020 CLINICAL DATA:  Questionable sepsis, sudden onset abdominal pain of a cyst. Recent CABG surgery last month. Fever EXAM: PORTABLE CHEST 1 VIEW COMPARISON:  Chest x-ray 07/25/2020. CT chest 07/06/2020. FINDINGS: The heart size and mediastinal contours are unchanged. Sternotomy wires with associated cardiac surgical changes noted. No focal consolidation. No pulmonary edema. No pleural effusion. No pneumothorax. No acute osseous abnormality. IMPRESSION: No active disease. Electronically Signed  By: Iven Finn M.D.   On: 08/03/2020 17:32        Scheduled Meds: . aspirin  325 mg Oral Daily  . enoxaparin (LOVENOX) injection  40 mg Subcutaneous Q24H  . fenofibrate  160 mg Oral Daily  . insulin aspart  0-15 Units Subcutaneous TID AC & HS  . insulin glargine  20 Units Subcutaneous Daily  . metoprolol tartrate  25 mg Oral BID  . rosuvastatin  20 mg Oral Daily   Continuous Infusions: . lactated ringers 100 mL/hr at 08/03/20 2322     LOS: 0 days    Time spent: 35 minutes.     Elmarie Shiley, MD [facility]   If 7PM-7AM, please contact night-coverage [email or web address]  08/04/2020, 7:54 AM

## 2020-08-05 ENCOUNTER — Inpatient Hospital Stay (HOSPITAL_COMMUNITY): Payer: Medicare HMO

## 2020-08-05 DIAGNOSIS — R109 Unspecified abdominal pain: Secondary | ICD-10-CM | POA: Diagnosis not present

## 2020-08-05 LAB — URINE CULTURE

## 2020-08-05 LAB — CBC
HCT: 28.9 % — ABNORMAL LOW (ref 39.0–52.0)
Hemoglobin: 8.9 g/dL — ABNORMAL LOW (ref 13.0–17.0)
MCH: 28.3 pg (ref 26.0–34.0)
MCHC: 30.8 g/dL (ref 30.0–36.0)
MCV: 92 fL (ref 80.0–100.0)
Platelets: 450 10*3/uL — ABNORMAL HIGH (ref 150–400)
RBC: 3.14 MIL/uL — ABNORMAL LOW (ref 4.22–5.81)
RDW: 15.8 % — ABNORMAL HIGH (ref 11.5–15.5)
WBC: 11.4 10*3/uL — ABNORMAL HIGH (ref 4.0–10.5)
nRBC: 0 % (ref 0.0–0.2)

## 2020-08-05 LAB — BASIC METABOLIC PANEL
Anion gap: 12 (ref 5–15)
BUN: 31 mg/dL — ABNORMAL HIGH (ref 8–23)
CO2: 21 mmol/L — ABNORMAL LOW (ref 22–32)
Calcium: 7.5 mg/dL — ABNORMAL LOW (ref 8.9–10.3)
Chloride: 103 mmol/L (ref 98–111)
Creatinine, Ser: 1.64 mg/dL — ABNORMAL HIGH (ref 0.61–1.24)
GFR, Estimated: 43 mL/min — ABNORMAL LOW (ref >60)
Glucose, Bld: 127 mg/dL — ABNORMAL HIGH (ref 70–99)
Potassium: 3.6 mmol/L (ref 3.5–5.1)
Sodium: 136 mmol/L (ref 135–145)

## 2020-08-05 LAB — GLUCOSE, CAPILLARY
Glucose-Capillary: 149 mg/dL — ABNORMAL HIGH (ref 70–99)
Glucose-Capillary: 200 mg/dL — ABNORMAL HIGH (ref 70–99)
Glucose-Capillary: 161 mg/dL — ABNORMAL HIGH (ref 70–99)
Glucose-Capillary: 164 mg/dL — ABNORMAL HIGH (ref 70–99)

## 2020-08-05 MED ORDER — INSULIN GLARGINE 100 UNIT/ML ~~LOC~~ SOLN
15.0000 [IU] | Freq: Every day | SUBCUTANEOUS | Status: DC
  Administered 2020-08-05 – 2020-08-10 (×6): 15 [IU] via SUBCUTANEOUS
  Filled 2020-08-05 (×6): qty 0.15

## 2020-08-05 MED ORDER — SODIUM CHLORIDE 0.9 % IV SOLN
1.0000 g | INTRAVENOUS | Status: DC
  Administered 2020-08-05: 1 g via INTRAVENOUS
  Filled 2020-08-05 (×2): qty 10

## 2020-08-05 NOTE — Progress Notes (Signed)
Patient ID: Patrick Miranda, male   DOB: 1945/08/19, 75 y.o.   MRN: [id number]   Acute Care Surgery Service Progress Note:    Chief Complaint/Subjective: No flatus or BM since 1/14 Still feels distended with abdominal tightness Nausea but controlled; no emesis  Tolerating clears   Objective: Vital signs in last 24 hours: Temp:  [97.7 F (36.5 C)-99.9 F (37.7 C)] 97.7 F (36.5 C) (01/16 0645) Pulse Rate:  [91-98] 92 (01/16 0645) Resp:  [16-18] 18 (01/16 0645) BP: (108-125)/(58-77) 122/66 (01/16 0645) SpO2:  [92 %-99 %] 95 % (01/16 0645) Last BM Date: 08/03/20  Intake/Output from previous day: 01/15 0701 - 01/16 0700 In: 2257.6 [P.O.:840; I.V.:1417.6] Out: 1700 [Urine:1700] Intake/Output this shift: No intake/output data recorded.  Lungs: cta, nonlabored  Cardiovascular: reg  Abd: soft, distended, reducible umb hernia, mild TTP  Extremities: no edema, +SCDs  Neuro: alert, nonfocal  Lab Results: CBC  Recent Labs    08/04/20 0237 08/05/20 0331  WBC 10.6* 11.4*  HGB 9.2* 8.9*  HCT 28.7* 28.9*  PLT 539* 450*   BMET Recent Labs    08/04/20 0237 08/05/20 0331  NA 138 136  K 4.1 3.6  CL 105 103  CO2 19* 21*  GLUCOSE 168* 127*  BUN 27* 31*  CREATININE 1.58* 1.64*  CALCIUM 8.4* 7.5*   LFT Hepatic Function Latest Ref Rng & Units 08/04/2020 08/03/2020 07/05/2020  Total Protein 6.5 - 8.1 g/dL 5.8(L) 6.0(L) 5.3(L)  Albumin 3.5 - 5.0 g/dL 2.7(L) 3.0(L) 2.9(L)  AST 15 - 41 U/L 46(H) 62(H) 18  ALT 0 - 44 U/L _0 Alk Phosphatase 38 - 126 U/L 65 83 44  Total Bilirubin 0.3 - 1.2 mg/dL 1.0 1.2 0.6   PT/INR Recent Labs    08/03/20 1756  LABPROT 15.2  INR 1.3*   ABG No results for input(s): PHART, HCO3 in the last 72 hours.  Invalid input(s): PCO2, PO2  Studies/Results:  Anti-infectives: Anti-infectives (From admission, onward)   Start     Dose/Rate Route Frequency Ordered Stop   08/03/20 2330  piperacillin-tazobactam (ZOSYN) IVPB 3.375 g   Status:  Discontinued       "Followed by" Linked Group Details   3.375 g 12.5 mL/hr over 240 Minutes Intravenous Every 8 hours 08/03/20 1715 08/03/20 2223   08/03/20 1730  piperacillin-tazobactam (ZOSYN) IVPB 3.375 g       "Followed by" Linked Group Details   3.375 g 100 mL/hr over 30 Minutes Intravenous  Once 08/03/20 1715 08/03/20 2002      Medications: Scheduled Meds: . aspirin  325 mg Oral Daily  . Chlorhexidine Gluconate Cloth  6 each Topical Daily  . enoxaparin (LOVENOX) injection  40 mg Subcutaneous Q24H  . fenofibrate  160 mg Oral Daily  . insulin aspart  0-15 Units Subcutaneous TID AC & HS  . insulin glargine  20 Units Subcutaneous Daily  . metoprolol tartrate  25 mg Oral BID  . rosuvastatin  20 mg Oral Daily  . tamsulosin  0.4 mg Oral Daily   Continuous Infusions: . sodium chloride 45 mL/hr at 08/04/20 1554   PRN Meds:.acetaminophen **OR** acetaminophen, morphine injection **OR** oxyCODONE-acetaminophen, ondansetron **OR** ondansetron (ZOFRAN) IV, polyethylene glycol  Assessment/Plan: Patient Active Problem List   Diagnosis Date Noted  . Abdominal pain 08/03/2020  . Chronic kidney disease, stage 3a ([place]) 08/03/2020  . Mixed diabetic hyperlipidemia associated with type 2 diabetes mellitus ([place]) 08/03/2020  . Uncontrolled type 2 diabetes mellitus with hyperglycemia, with long-term current  use of insulin ([place]) 08/03/2020  . Foodborne gastroenteritis 08/03/2020  . SIRS (systemic inflammatory response syndrome) ([place]) 08/03/2020  . Postop check 07/25/2020  . Hx of CABG 07/09/2020  . Coronary artery disease involving native heart 07/04/2020  . Abnormal nuclear stress test   . Multiple subsegmental pulmonary emboli without acute cor pulmonale (HCC)   . AF (paroxysmal atrial fibrillation) ([place])   . Hypersomnia 08/30/2019  . History of adenomatous polyp of colon 05/06/2018  . Vasomotor rhinitis 07/18/2017  . Arthritis of knee, right 01/06/2014  . Brainstem stroke ([place])  09/07/2011    Class: Acute  . Dysarthria 09/05/2011    Class: Acute  . Essential hypertension 09/05/2011    Class: Chronic  . OSA on CPAP 09/05/2011    Class: Chronic  . Anemia 09/05/2011    Class: Chronic   Abdominal distension Likely ileus from recent gastroenteritis Reducible fat containing umbilical hernia Fevers resolved; stable mild leukocytosis   Bowel rest, IVF Await return of bowel function Monitor lytes If starts vomiting - place NG tube Disposition:  LOS: 1 day    Berniece Salines, MD PGY-6, General Surgery [facility]

## 2020-08-05 NOTE — Progress Notes (Signed)
PROGRESS NOTE    Patrick Miranda  [id number] DOB: [birth year] DOA: 08/03/2020 PCP: Burnard Bunting, MD   Brief Narrative: 75 year old with past medical history significant for CAD status post CABG 06/2020, obstructive sleep apnea, paroxysmal A. fib, insulin-dependent diabetes type 2, hypertension, chronic kidney disease stage IIIa who presents to [facility] complaining of nausea vomiting and abdominal pain.  Patient explains that the morning of admission after eating breakfast fried egg, 77 days old rice and refrigerated already open can of peaches he began experience abdominal pain.  Symptoms accompanied by multiple episodes of vomiting.  Symptoms persisted and eventually patient presented to [facility].  On evaluation patient was found to be tachycardic, tachypneic with a fever of 101.  Mild leukocytosis 11.5.  Assessment & Plan:   Principal Problem:   Abdominal pain Active Problems:   Essential hypertension   AF (paroxysmal atrial fibrillation) (HCC)   Coronary artery disease involving native heart   Chronic kidney disease, stage 3a (HCC)   Mixed diabetic hyperlipidemia associated with type 2 diabetes mellitus ([place])   Uncontrolled type 2 diabetes mellitus with hyperglycemia, with long-term current use of insulin (HCC)   Foodborne gastroenteritis   SIRS (systemic inflammatory response syndrome) (HCC)  1-Abdominal Pain, Nausea, Vomiting:  -?  Foodborne illness, but wife report patient was not feeling well on Thursday day prior events.  -CT abdomen Pelvis: Aortic atherosclerosis. Small fat containing periumbilical hernia. No acute abnormalities.  -patient with persistent abdominal pain, more distended today. Plan to get KUB.  -KUB with ileus vs SBO. Surgery following.  -continue with clear diet.  -KUB 1/16 distension of Bowel consistent with ileus.  -ambulate, continue with IV fluids.    SIRS;  Could be related to gastroenteritis.  Blood culture: No  growth, urine culture: multiples species suggest recollection.  Report Diarrhea on admission, plan to get C diff and GI pathogen.  Monitor off IV antibiotics.  If spike high fever will start antibiotics.  Unable to send stool sample, diarrhea resolved.  He has remain afebrile.  Due to urine retention will treat for UTI.   A fib; Continue with metoprolol.   CAD S/P CABG;  Had CABG 06/2020. Follow up with Dr Nils Pyle.  Informed CVTS of admission.   CKD stage 3 a; Cr range 1.4---1.6 Remain stable.  Monitor.   Diabetes type 2;  Continue with lantus and SSI>   Thrombocytosis;  Continue with aspirin.  Lower than 2 weeks ago, platelet count at that time was 1,004  Mild elevation troponin; denies chest pain.     Estimated body mass index is 23.71 kg/m as calculated from the following:   Height as of 08/01/20: 5\' 11"  (1.803 m).   Weight as of 08/01/20: 77.1 kg.   DVT prophylaxis: Lovenox Code Status: Full code Family Communication: wife over the phone Disposition Plan:  Status is: inpatient.   The patient remains OBS appropriate and will d/c before 2 midnights.  Dispo: The patient is from: Home              Anticipated d/c is to: Home              Anticipated d/c date is: 2 days              Patient currently is not medically stable to d/c.        Consultants:   Surgery   Procedures:   none  Antimicrobials:    Subjective: He is still having abdominal pain, supra-pubic.  7/10. Increase again this am the pain.  No passing gas. No BM yet.    Objective: Vitals:   08/04/20 1109 08/04/20 1700 08/04/20 2117 08/05/20 0645  BP: (!) 108/58 114/64 125/77 122/66  Pulse: 91 98 94 92  Resp: 18 18 16 18   Temp: 99.4 F (37.4 C) 98.8 F (37.1 C) 99.9 F (37.7 C) 97.7 F (36.5 C)  TempSrc: Oral Oral Oral Oral  SpO2: 92% 94% 99% 95%    Intake/Output Summary (Last 24 hours) at 08/05/2020 0906 Last data filed at 08/05/2020 0600 Gross per 24 hour  Intake  1156.69 ml  Output 1425 ml  Net -268.31 ml   There were no vitals filed for this visit.  Examination:  General exam: NAD Respiratory system: CTA Cardiovascular system: S 1, S 2, RRR Gastrointestinal system: B S decreased, no rigidity. Mild tenderness.  Central nervous system: alert, following command Extremities: Symmetric power    Data Reviewed: I have personally reviewed following labs and imaging studies  CBC: Recent Labs  Lab 08/03/20 1756 08/04/20 0237 08/05/20 0331  WBC 11.5* 10.6* 11.4*  NEUTROABS 10.2* 8.9*  --   HGB 10.0* 9.2* 8.9*  HCT 31.4* 28.7* 28.9*  MCV 91.8 91.7 92.0  PLT 636* 539* 099*   Basic Metabolic Panel: Recent Labs  Lab 08/03/20 1756 08/04/20 0237 08/05/20 0331  NA 140 138 136  K 4.3 4.1 3.6  CL 105 105 103  CO2 17* 19* 21*  GLUCOSE 212* 168* 127*  BUN 27* 27* 31*  CREATININE 1.51* 1.58* 1.64*  CALCIUM 8.6* 8.4* 7.5*  MG  --  1.7  --   PHOS  --  4.6  --    GFR: Estimated Creatinine Clearance: 41.5 mL/min (A) (by C-G formula based on SCr of 1.64 mg/dL (H)). Liver Function Tests: Recent Labs  Lab 08/03/20 1756 08/04/20 0237  AST 62* 46*  ALT 19 17  ALKPHOS 83 65  BILITOT 1.2 1.0  PROT 6.0* 5.8*  ALBUMIN 3.0* 2.7*   No results for input(s): LIPASE, AMYLASE in the last 168 hours. No results for input(s): AMMONIA in the last 168 hours. Coagulation Profile: Recent Labs  Lab 08/03/20 1756  INR 1.3*   Cardiac Enzymes: No results for input(s): CKTOTAL, CKMB, CKMBINDEX, TROPONINI in the last 168 hours. BNP (last 3 results) No results for input(s): PROBNP in the last 8760 hours. HbA1C: No results for input(s): HGBA1C in the last 72 hours. CBG: Recent Labs  Lab 08/04/20 0810 08/04/20 1302 08/04/20 1811 08/04/20 2232 08/05/20 0646  GLUCAP 144* 148* 151* 203* 149*   Lipid Profile: No results for input(s): CHOL, HDL, LDLCALC, TRIG, CHOLHDL, LDLDIRECT in the last 72 hours. Thyroid Function Tests: No results for  input(s): TSH, T4TOTAL, FREET4, T3FREE, THYROIDAB in the last 72 hours. Anemia Panel: No results for input(s): VITAMINB12, FOLATE, FERRITIN, TIBC, IRON, RETICCTPCT in the last 72 hours. Sepsis Labs: Recent Labs  Lab 08/03/20 1756 08/04/20 0237  LATICACIDVEN 1.8 1.4    Recent Results (from the past 240 hour(s))  Blood Culture (routine x 2)     Status: None (Preliminary result)   Collection Time: 08/03/20  5:09 PM   Specimen: BLOOD  Result Value Ref Range Status   Specimen Description BLOOD SITE NOT SPECIFIED  Final   Special Requests   Final    BOTTLES DRAWN AEROBIC AND ANAEROBIC Blood Culture adequate volume   Culture   Final    NO GROWTH 2 DAYS Performed at [facility], [address], [place], [place] [address]    Report Status PENDING  Incomplete  Blood Culture (routine x 2)     Status: None (Preliminary result)   Collection Time: 08/03/20  5:57 PM   Specimen: BLOOD  Result Value Ref Range Status   Specimen Description BLOOD SITE NOT SPECIFIED  Final   Special Requests   Final    BOTTLES DRAWN AEROBIC AND ANAEROBIC Blood Culture adequate volume   Culture   Final    NO GROWTH 2 DAYS Performed at [facility], [address], [place], [place] [address]    Report Status PENDING  Incomplete  Resp Panel by RT-PCR (Flu A&B, Covid)     Status: None   Collection Time: 08/03/20  7:14 PM   Specimen: Nasopharyngeal(NP) swabs in vial transport medium  Result Value Ref Range Status   SARS Coronavirus 2 by RT PCR NEGATIVE NEGATIVE Final    Comment: (NOTE) SARS-CoV-2 target nucleic acids are NOT DETECTED.  The SARS-CoV-2 RNA is generally detectable in upper respiratory specimens during the acute phase of infection. The lowest concentration of SARS-CoV-2 viral copies this assay can detect is 138 copies/mL. A negative result does not preclude SARS-Cov-2 infection and should not be used as the sole basis for treatment or other patient management decisions. A negative  result may occur with  improper specimen collection/handling, submission of specimen other than nasopharyngeal swab, presence of viral mutation(s) within the areas targeted by this assay, and inadequate number of viral copies(<138 copies/mL). A negative result must be combined with clinical observations, patient history, and epidemiological information. The expected result is Negative.  Fact Sheet for Patients:  EntrepreneurPulse.com.au  Fact Sheet for Healthcare Providers:  IncredibleEmployment.be  This test is no t yet approved or cleared by the [place] FDA and  has been authorized for detection and/or diagnosis of SARS-CoV-2 by FDA under an Emergency Use Authorization (EUA). This EUA will remain  in effect (meaning this test can be used) for the duration of the COVID-19 declaration under Section 564(b)(1) of the Act, 21 U.S.C.section 360bbb-3(b)(1), unless the authorization is terminated  or revoked sooner.       Influenza A by PCR NEGATIVE NEGATIVE Final   Influenza B by PCR NEGATIVE NEGATIVE Final    Comment: (NOTE) The Xpert Xpress SARS-CoV-2/FLU/RSV plus assay is intended as an aid in the diagnosis of influenza from Nasopharyngeal swab specimens and should not be used as a sole basis for treatment. Nasal washings and aspirates are unacceptable for Xpert Xpress SARS-CoV-2/FLU/RSV testing.  Fact Sheet for Patients: EntrepreneurPulse.com.au  Fact Sheet for Healthcare Providers: IncredibleEmployment.be  This test is not yet approved or cleared by the [place] FDA and has been authorized for detection and/or diagnosis of SARS-CoV-2 by FDA under an Emergency Use Authorization (EUA). This EUA will remain in effect (meaning this test can be used) for the duration of the COVID-19 declaration under Section 564(b)(1) of the Act, 21 U.S.C. section 360bbb-3(b)(1), unless the authorization is  terminated or revoked.  Performed at [facility], [place] [address] [place], [place] [address]          Radiology Studies: DG Abd 1 View  Result Date: 08/04/2020 CLINICAL DATA:  Abdominal pain and vomiting EXAM: ABDOMEN - 1 VIEW COMPARISON:  08/03/2013 CT abdomen/pelvis. FINDINGS: A few mildly dilated small bowel loops throughout the central and left abdomen, up to 3.6 cm diameter, new since CT from 1 day prior. New prominent gaseous distention of the colon in  the right lower quadrant, probably the sigmoid colon. No evidence of pneumatosis or pneumoperitoneum. Mild diffuse colonic stool. No radiopaque nephrolithiasis. Moderate degenerative changes throughout the lumbar spine. Excreted contrast in the urinary bladder. IMPRESSION: Prominent gaseous distention of the colon in the right lower quadrant, probably the sigmoid colon. A few mildly dilated small bowel loops throughout the central and left abdomen. These findings are new since CT from 1 day prior, favoring a mild adynamic ileus, with developing partial distal small bowel obstruction not entirely excluded. Electronically Signed   By: Ilona Sorrel M.D.   On: 08/04/2020 10:39   CT ABDOMEN PELVIS W CONTRAST  Result Date: 08/03/2020 CLINICAL DATA:  Acute right lower quadrant abdominal pain. EXAM: CT ABDOMEN AND PELVIS WITH CONTRAST TECHNIQUE: Multidetector CT imaging of the abdomen and pelvis was performed using the standard protocol following bolus administration of intravenous contrast. CONTRAST:  12mL OMNIPAQUE IOHEXOL 300 MG/ML  SOLN COMPARISON:  None. FINDINGS: Lower chest: No acute abnormality. Hepatobiliary: No focal liver abnormality is seen. No gallstones, gallbladder wall thickening, or biliary dilatation. Pancreas: Unremarkable. No pancreatic ductal dilatation or surrounding inflammatory changes. Spleen: Normal in size without focal abnormality. Adrenals/Urinary Tract: Adrenal glands appear normal. Bilateral parapelvic cysts are  noted. No hydronephrosis or renal obstruction is noted. No renal or ureteral calculi are noted. Urinary bladder is unremarkable. Stomach/Bowel: Stomach is within normal limits. Appendix appears normal. No evidence of bowel wall thickening, distention, or inflammatory changes. Vascular/Lymphatic: Aortic atherosclerosis. No enlarged abdominal or pelvic lymph nodes. Reproductive: Prostate is unremarkable. Other: Small fat containing periumbilical hernia is noted. No ascites is noted. Musculoskeletal: No acute or significant osseous findings. IMPRESSION: Aortic atherosclerosis. Small fat containing periumbilical hernia. No acute abnormality seen in the abdomen or pelvis. Aortic Atherosclerosis (ICD10-I70.0). Electronically Signed   By: Marijo Conception M.D.   On: 08/03/2020 20:09   DG Chest Port 1 View  Result Date: 08/03/2020 CLINICAL DATA:  Questionable sepsis, sudden onset abdominal pain of a cyst. Recent CABG surgery last month. Fever EXAM: PORTABLE CHEST 1 VIEW COMPARISON:  Chest x-ray 07/25/2020. CT chest 07/06/2020. FINDINGS: The heart size and mediastinal contours are unchanged. Sternotomy wires with associated cardiac surgical changes noted. No focal consolidation. No pulmonary edema. No pleural effusion. No pneumothorax. No acute osseous abnormality. IMPRESSION: No active disease. Electronically Signed   By: Iven Finn M.D.   On: 08/03/2020 17:32        Scheduled Meds: . aspirin  325 mg Oral Daily  . Chlorhexidine Gluconate Cloth  6 each Topical Daily  . enoxaparin (LOVENOX) injection  40 mg Subcutaneous Q24H  . fenofibrate  160 mg Oral Daily  . insulin aspart  0-15 Units Subcutaneous TID AC & HS  . insulin glargine  15 Units Subcutaneous Daily  . metoprolol tartrate  25 mg Oral BID  . rosuvastatin  20 mg Oral Daily  . tamsulosin  0.4 mg Oral Daily   Continuous Infusions: . sodium chloride 45 mL/hr at 08/04/20 1554     LOS: 1 day    Time spent: 35 minutes.     Elmarie Shiley, MD [facility]   If 7PM-7AM, please contact night-coverage [email or web address]  08/05/2020, 9:06 AM

## 2020-08-05 NOTE — Progress Notes (Signed)
   08/05/20 2200  Assess: MEWS Score  Temp (!) 102.7 F (39.3 C)  BP 132/63  Pulse Rate (!) 108  Resp 16  SpO2 93 %  O2 Device CPAP  Assess: MEWS Score  MEWS Temp 2  MEWS Systolic 0  MEWS Pulse 1  MEWS RR 0  MEWS LOC 0  MEWS Score 3  MEWS Score Color Yellow  Assess: if the MEWS score is Yellow or Red  Were vital signs taken at a resting state? Yes  Focused Assessment No change from prior assessment  Early Detection of Sepsis Score *See Row Information* Medium  MEWS guidelines implemented *See Row Information* No, altered LOC is baseline  Treat  MEWS Interventions Administered prn meds/treatments  Take Vital Signs  Increase Vital Sign Frequency  Yellow: Q 2hr X 2 then Q 4hr X 2, if remains yellow, continue Q 4hrs  Escalate  MEWS: Escalate Yellow: discuss with charge nurse/RN and consider discussing with provider and RRT  Notify: Charge Nurse/RN  Name of Charge Nurse/RN Notified Kim  Date Charge Nurse/RN Notified 08/12/20  Time Charge Nurse/RN Notified 2215  Notify: Provider  Provider Name/Title Shalbou  Date Provider Notified 08/05/20  Time Provider Notified 2215  Notification Type Page  Notification Reason Other (Comment) (MEWS change)

## 2020-08-06 ENCOUNTER — Inpatient Hospital Stay (HOSPITAL_COMMUNITY): Payer: Medicare HMO

## 2020-08-06 ENCOUNTER — Ambulatory Visit: Payer: Medicare Other | Admitting: Physician Assistant

## 2020-08-06 DIAGNOSIS — I351 Nonrheumatic aortic (valve) insufficiency: Secondary | ICD-10-CM | POA: Diagnosis not present

## 2020-08-06 DIAGNOSIS — I480 Paroxysmal atrial fibrillation: Secondary | ICD-10-CM

## 2020-08-06 DIAGNOSIS — I5020 Unspecified systolic (congestive) heart failure: Secondary | ICD-10-CM | POA: Diagnosis not present

## 2020-08-06 DIAGNOSIS — R509 Fever, unspecified: Secondary | ICD-10-CM

## 2020-08-06 DIAGNOSIS — I2510 Atherosclerotic heart disease of native coronary artery without angina pectoris: Secondary | ICD-10-CM

## 2020-08-06 DIAGNOSIS — R609 Edema, unspecified: Secondary | ICD-10-CM | POA: Diagnosis not present

## 2020-08-06 DIAGNOSIS — I513 Intracardiac thrombosis, not elsewhere classified: Secondary | ICD-10-CM

## 2020-08-06 DIAGNOSIS — R109 Unspecified abdominal pain: Secondary | ICD-10-CM | POA: Diagnosis not present

## 2020-08-06 LAB — BASIC METABOLIC PANEL
Anion gap: 10 (ref 5–15)
BUN: 26 mg/dL — ABNORMAL HIGH (ref 8–23)
CO2: 23 mmol/L (ref 22–32)
Calcium: 7.6 mg/dL — ABNORMAL LOW (ref 8.9–10.3)
Chloride: 103 mmol/L (ref 98–111)
Creatinine, Ser: 1.63 mg/dL — ABNORMAL HIGH (ref 0.61–1.24)
GFR, Estimated: 44 mL/min — ABNORMAL LOW (ref >60)
Glucose, Bld: 120 mg/dL — ABNORMAL HIGH (ref 70–99)
Potassium: 3.6 mmol/L (ref 3.5–5.1)
Sodium: 136 mmol/L (ref 135–145)

## 2020-08-06 LAB — CBC
HCT: 26.6 % — ABNORMAL LOW (ref 39.0–52.0)
Hemoglobin: 8.5 g/dL — ABNORMAL LOW (ref 13.0–17.0)
MCH: 28.7 pg (ref 26.0–34.0)
MCHC: 32 g/dL (ref 30.0–36.0)
MCV: 89.9 fL (ref 80.0–100.0)
Platelets: 442 10*3/uL — ABNORMAL HIGH (ref 150–400)
RBC: 2.96 MIL/uL — ABNORMAL LOW (ref 4.22–5.81)
RDW: 15.6 % — ABNORMAL HIGH (ref 11.5–15.5)
WBC: 9.3 10*3/uL (ref 4.0–10.5)
nRBC: 0 % (ref 0.0–0.2)

## 2020-08-06 LAB — ECHOCARDIOGRAM COMPLETE
Area-P 1/2: 3.39 cm2
Height: 71 in
P 1/2 time: 412 msec
S' Lateral: 3.9 cm
Weight: 2719.98 oz

## 2020-08-06 LAB — GLUCOSE, CAPILLARY
Glucose-Capillary: 145 mg/dL — ABNORMAL HIGH (ref 70–99)
Glucose-Capillary: 184 mg/dL — ABNORMAL HIGH (ref 70–99)
Glucose-Capillary: 165 mg/dL — ABNORMAL HIGH (ref 70–99)
Glucose-Capillary: 141 mg/dL — ABNORMAL HIGH (ref 70–99)

## 2020-08-06 LAB — URINE CULTURE: Culture: NO GROWTH

## 2020-08-06 LAB — MAGNESIUM: Magnesium: 2.1 mg/dL (ref 1.7–2.4)

## 2020-08-06 MED ORDER — METOPROLOL SUCCINATE ER 50 MG PO TB24
50.0000 mg | ORAL_TABLET | Freq: Every day | ORAL | Status: DC
  Administered 2020-08-07: 50 mg via ORAL
  Filled 2020-08-06 (×2): qty 1

## 2020-08-06 MED ORDER — SODIUM CHLORIDE 0.9 % IV SOLN
2.0000 g | INTRAVENOUS | Status: DC
  Administered 2020-08-06 – 2020-08-10 (×5): 2 g via INTRAVENOUS
  Filled 2020-08-06 (×5): qty 20

## 2020-08-06 MED ORDER — IOHEXOL 9 MG/ML PO SOLN
ORAL | Status: AC
  Filled 2020-08-06: qty 1000

## 2020-08-06 MED ORDER — HEPARIN (PORCINE) 25000 UT/250ML-% IV SOLN
2100.0000 [IU]/h | INTRAVENOUS | Status: AC
  Administered 2020-08-06: 1300 [IU]/h via INTRAVENOUS
  Administered 2020-08-07: 1550 [IU]/h via INTRAVENOUS
  Administered 2020-08-07: 1450 [IU]/h via INTRAVENOUS
  Administered 2020-08-08: 1900 [IU]/h via INTRAVENOUS
  Filled 2020-08-06 (×7): qty 250

## 2020-08-06 MED ORDER — HEPARIN BOLUS VIA INFUSION
4000.0000 [IU] | Freq: Once | INTRAVENOUS | Status: AC
  Administered 2020-08-06: 4000 [IU] via INTRAVENOUS
  Filled 2020-08-06: qty 4000

## 2020-08-06 MED ORDER — POTASSIUM CHLORIDE CRYS ER 20 MEQ PO TBCR
20.0000 meq | EXTENDED_RELEASE_TABLET | Freq: Once | ORAL | Status: AC
  Administered 2020-08-06: 20 meq via ORAL
  Filled 2020-08-06: qty 1

## 2020-08-06 MED ORDER — ASPIRIN EC 81 MG PO TBEC
81.0000 mg | DELAYED_RELEASE_TABLET | Freq: Every day | ORAL | Status: DC
  Administered 2020-08-07 – 2020-08-10 (×4): 81 mg via ORAL
  Filled 2020-08-06 (×4): qty 1

## 2020-08-06 NOTE — Evaluation (Signed)
Physical Therapy Evaluation Patient Details Name: Patrick Miranda MRN: [id number] DOB: 07/13/1946 Today's Date: 08/06/2020   History of Present Illness  75yo male presenting to [facility] c/o nausea and vomiting as well as abdominal pain. Admitted for work up. PMH hx brain stem CVA, skin CA, covid-19, HTN, HOH, HLD, NSTEMI, hx PE, DM, CABG 06/2020, TKA  Clinical Impression   Patient received in bed, pleasant and cooperative with therapy, although with almost no recall of sternal precautions, needed verbal review of precautions/techniques to protect area of sternal incision. Able to mobilize on a min guard to MinA basis although fairly unsteady without BUE support- gait and balance did improve as we kept walking but would likely still benefit from RW use. Left up in recliner with all needs met this afternoon. Will benefit from skilled HHPT follow up moving forward.     Follow Up Recommendations Home health PT    Equipment Recommendations  Rolling walker with 5" wheels;3in1 (PT)    Recommendations for Other Services       Precautions / Restrictions Precautions Precautions: Fall;Sternal Precaution Booklet Issued: No Precaution Comments: sternal precautions- verbally reviewed (CABG done December 2021) Restrictions Weight Bearing Restrictions: No      Mobility  Bed Mobility Overal bed mobility: Needs Assistance Bed Mobility: Rolling;Sidelying to Sit Rolling: Min assist Sidelying to sit: Min guard;HOB elevated       General bed mobility comments: needed multimodal cues and MinA for technique for rolling, once on his side he was able to push up to sitting from his elbow to upright at EOB    Transfers Overall transfer level: Needs assistance Equipment used: None Transfers: Sit to/from Stand Sit to Stand: Min assist         General transfer comment: cues for hand placement, MinA to fully boost up to standing and gain balance  Ambulation/Gait Ambulation/Gait assistance: Min  guard Gait Distance (Feet): 90 Feet Assistive device: None Gait Pattern/deviations: Step-to pattern;Decreased step length - right;Decreased step length - left;Decreased stride length;Shuffle;Decreased stance time - right;Decreased stance time - left;Trunk flexed;Narrow base of support Gait velocity: decreased   General Gait Details: initially wtih shuffling gait pattern and mild R/L drift and unsteadiness; balance and straight line navigation improved with ongoing gait, but remains easily fatigued and generally unsteady without BUE support  Science writer    Modified Rankin (Stroke Patients Only)       Balance Overall balance assessment: Needs assistance Sitting-balance support: No upper extremity supported;Feet supported Sitting balance-Leahy Scale: Good     Standing balance support: Bilateral upper extremity supported;During functional activity Standing balance-Leahy Scale: Poor Standing balance comment: relies on UE support for balance                             Pertinent Vitals/Pain Pain Assessment: Faces Faces Pain Scale: Hurts even more Pain Location: lower abdomen Pain Descriptors / Indicators: Discomfort;Grimacing;Sharp Pain Intervention(s): Limited activity within patient's tolerance;Monitored during session;Repositioned    Home Living Family/patient expects to be discharged to:: Private residence Living Arrangements: Spouse/significant other;Children Available Help at Discharge: Family;Available 24 hours/day Type of Home: House Home Access: Level entry     Home Layout: One level;Laundry or work area in [place]: Environmental consultant - 2 wheels;Bedside commode;Shower seat;Grab bars - tub/shower;Hand held shower head      Prior Function Level of Independence: Independent  Comments: drives, yardwork     Hand Dominance   Dominant Hand: Right    Extremity/Trunk Assessment   Upper Extremity  Assessment Upper Extremity Assessment: Defer to OT evaluation    Lower Extremity Assessment Lower Extremity Assessment: Generalized weakness    Cervical / Trunk Assessment Cervical / Trunk Assessment: Kyphotic  Communication   Communication: No difficulties  Cognition Arousal/Alertness: Awake/alert Behavior During Therapy: WFL for tasks assessed/performed Overall Cognitive Status: Within Functional Limits for tasks assessed                                 General Comments: possibly some STM deficits- could not remember sternal precautions      General Comments      Exercises     Assessment/Plan    PT Assessment Patient needs continued PT services  PT Problem List Decreased activity tolerance;Decreased balance;Decreased mobility;Decreased knowledge of use of DME;Decreased safety awareness;Decreased knowledge of precautions;Cardiopulmonary status limiting activity;Decreased strength;Decreased coordination       PT Treatment Interventions Gait training;DME instruction;Functional mobility training;Therapeutic activities;Therapeutic exercise;Balance training;Patient/family education    PT Goals (Current goals can be found in the Care Plan section)  Acute Rehab PT Goals Patient Stated Goal: to go home PT Goal Formulation: With patient Time For Goal Achievement: 08/20/20 Potential to Achieve Goals: Good    Frequency Min 3X/week   Barriers to discharge        Co-evaluation               AM-PAC PT "6 Clicks" Mobility  Outcome Measure Help needed turning from your back to your side while in a flat bed without using bedrails?: A Little Help needed moving from lying on your back to sitting on the side of a flat bed without using bedrails?: A Little Help needed moving to and from a bed to a chair (including a wheelchair)?: A Little Help needed standing up from a chair using your arms (e.g., wheelchair or bedside chair)?: A Little Help needed to walk in  hospital room?: A Little Help needed climbing 3-5 steps with a railing? : A Little 6 Click Score: 18    End of Session Equipment Utilized During Treatment: Gait belt Activity Tolerance: Patient tolerated treatment well Patient left: in chair;with call bell/phone within reach Nurse Communication: Mobility status PT Visit Diagnosis: Unsteadiness on feet (R26.81);Muscle weakness (generalized) (M62.81)    Time: 9629-5284 PT Time Calculation (min) (ACUTE ONLY): 23 min   Charges:   PT Evaluation $PT Eval Moderate Complexity: 1 Mod PT Treatments $Gait Training: 8-22 mins       Windell Norfolk, DPT, PN1   Supplemental Physical Therapist [place]    Pager [phone] Acute Rehab Office [phone]

## 2020-08-06 NOTE — CV Procedure (Signed)
BLE venous duplex completed.  Results can be found under chart review under CV PROC. 08/06/2020 11:52 AM Kalyse Meharg RVT, RDMS

## 2020-08-06 NOTE — Consult Note (Signed)
Cardiology Consult Note:   Patient ID: Patrick Miranda MRN: [id number]; DOB: 05/27/1946   Admission date: 08/03/2020  Primary Care Provider: Burnard Bunting, MD [facility] Cardiologist: Rozann Lesches, MD  [place] Electrophysiologist:  None   Chief Complaint:  New LV thrombus  Patient Profile:   Patrick Miranda is a 75 y.o. male with history of Parosyxmal Atrial Fibrillation in the setting of COVID-19 and post CABG, Prior PE related to COVID-19 infection, with known inferoapical WMS and HFmrEF, DM with HTN, HLD and Aortic Atherosclerosis who received urgent 4V CABG 07/09/20 who presented to the ED with concern for sepsis.  Incidentally noted to have LV thrombus as part of work up.  History of Present Illness:   Patrick Miranda originally presented to [facility] 08/03/20:  Patient has post breatked abdominal pain and nausea vomiting.  Found to have a persistent fever, nausea, and leukocytosis with thrombocystosis.  Through his care has been assess by surgery and found to have likely ileus and gastroenteritis. Found to have repeat high grade fevers and so echocardiogram was performed for assessment of cardiac infectious etiology.  Incidentally, an LV thrombus was detected.   Through this, patient notes he is doing well outside of his abdominal pain.  Still have some fevers.  Is able to take down some food and may have improved from 08/05/20.  No acute 10/10 abdominal pain since presentation.  Unclear etiology of prior stroke (does not no of any deficit from that event).  No chest pain or pressure .  No SOB/DOE and no PND/Orthopnea.  No weight gain or leg swelling.  No palpitations or syncope.  Has been minimally active since admission.  Past Medical History:  Diagnosis Date  . Arthritis   . 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 Cancer (HCC)    Skin  . Cerebral artery disease   . Coronary artery disease   . COVID-07 Dec 2019  . COVID-19 virus infection 12/02/2019  . Depression    . Essential hypertension   . Hernia, umbilical   . HOH (hard of hearing)   . Hyperlipidemia   . NSTEMI (non-ST elevated myocardial infarction) ([place])   . OSA on CPAP   . Pulmonary emboli [facility])    May 2021  . Type 2 diabetes mellitus ([place])     Past Surgical History:  Procedure Laterality Date  . COLONOSCOPY W/ POLYPECTOMY    . CORONARY ARTERY BYPASS GRAFT N/A 07/09/2020   Procedure: CORONARY ARTERY BYPASS GRAFTING (CABG) TIMES THREE , USING LEFT INTERNAL MAMMARY ARTERY AND RIGHT LEG GREATER SAPHENOUS VEIN HARVESTED ENDOSCOPICALLY;  Surgeon: Ivin Poot, MD;  Location: [place];  Service: Open Heart Surgery;  Laterality: N/A;  . LEFT HEART CATH AND CORONARY ANGIOGRAPHY N/A 07/04/2020   Procedure: LEFT HEART CATH AND CORONARY ANGIOGRAPHY;  Surgeon: Troy Sine, MD;  Location: [place] CV LAB;  Service: Cardiovascular;  Laterality: N/A;  . TEE WITHOUT CARDIOVERSION N/A 07/09/2020   Procedure: TRANSESOPHAGEAL ECHOCARDIOGRAM (TEE);  Surgeon: Prescott Gum, Collier Salina, MD;  Location: [place];  Service: Open Heart Surgery;  Laterality: N/A;  . TOTAL KNEE ARTHROPLASTY Left 04/22/2013   Procedure: TOTAL KNEE ARTHROPLASTY- left;  Surgeon: Augustin Schooling, MD;  Location: [place];  Service: Orthopedics;  Laterality: Left;  with femoral block  . TOTAL KNEE ARTHROPLASTY Right 01/06/2014   Procedure: RIGHT TOTAL KNEE ARTHROPLASTY;  Surgeon: Augustin Schooling, MD;  Location: [place];  Service: Orthopedics;  Laterality: Right;  Medications Prior to Admission: Prior to Admission medications   Medication Sig Start Date End Date Taking? Authorizing Provider  aspirin EC 325 MG EC tablet Take 1 tablet (325 mg total) by mouth daily. 07/16/20  Yes Gold, Wayne E, PA-C  Empagliflozin-metFORMIN HCl 11-998 MG TABS Take 1 tablet by mouth 2 (two) times daily.   Yes [provider]  fenofibrate 160 MG tablet Take 160 mg by mouth daily.   Yes [provider]  ferrous Q000111Q C-folic acid  (TRINSICON / FOLTRIN) capsule Take 1 capsule by mouth 2 (two) times daily after a meal. 07/16/20  Yes Gold, Wilder Glade, PA-C  HUMALOG KWIKPEN 100 UNIT/ML KwikPen Inject 10 Units into the skin with breakfast, with lunch, and with evening meal.  04/27/18  Yes [provider]  metoprolol tartrate (LOPRESSOR) 25 MG tablet Take 1 tablet (25 mg total) by mouth 2 (two) times daily. 07/16/20  Yes Gold, Wayne E, PA-C  rosuvastatin (CRESTOR) 20 MG tablet Take 1 tablet (20 mg total) by mouth daily. 07/16/20  Yes Gold, Wayne E, PA-C  TOUJEO SOLOSTAR 300 UNIT/ML SOPN Inject 30 Units into the skin daily.  06/25/18  Yes [provider]  BD PEN NEEDLE NANO U/F 32G X 4 MM MISC 1 each by Other route daily.  03/19/18   [provider]  [facility] VERIO test strip 1 each by Other route in the morning, at noon, and at bedtime.  03/20/18   [provider]  traMADol (ULTRAM) 50 MG tablet Take 1 tablet (50 mg total) by mouth every 6 (six) hours as needed for moderate pain. Patient not taking: Reported on 08/04/2020 07/16/20   John Giovanni, PA-C     Allergies:    Allergies  Allergen Reactions  . Lipitor [Atorvastatin] Other (See Comments)    Muscle pain    Social History:   Social History   Socioeconomic History  . Marital status: Married    Spouse name: Not on file  . Number of children: Not on file  . Years of education: Not on file  . Highest education level: Not on file  Occupational History  . Not on file  Tobacco Use  . Smoking status: Never Smoker  . Smokeless tobacco: Never Used  Substance and Sexual Activity  . Alcohol use: No  . Drug use: No  . Sexual activity: Not on file  Other Topics Concern  . Not on file  Social History Narrative  . Not on file   Social Determinants of Health   Financial Resource Strain: Not on file  Food Insecurity: No Food Insecurity  . Worried About Charity fundraiser in the Last Year: Never true  . Ran Out of Food in the Last Year:  Never true  Transportation Needs: No Transportation Needs  . Lack of Transportation (Medical): No  . Lack of Transportation (Non-Medical): No  Physical Activity: Not on file  Stress: Not on file  Social Connections: Not on file  Intimate Partner Violence: Not on file    Family History:   The patient's family history includes CAD in his brother.    ROS:  Please see the history of present illness.  Fevers noted. All other ROS reviewed and negative.     Physical Exam/Data:   Vitals:   08/06/20 0654 08/06/20 1014 08/06/20 1115 08/06/20 1652  BP: 135/68 126/65 126/65 (!) 102/58  Pulse: 92 95 95 90  Resp: 18 16 16 14   Temp: 99.4 F (37.4 C) 100 F (  37.8 C)  99.4 F (37.4 C)  TempSrc: Oral Oral  Oral  SpO2: 93% 94%  97%  Weight:   77.1 kg   Height:   5\' 11"  (1.803 m)     Intake/Output Summary (Last 24 hours) at 08/06/2020 1658 Last data filed at 08/06/2020 1631 Gross per 24 hour  Intake 1808.84 ml  Output 3200 ml  Net -1391.16 ml   Last 3 Weights 08/06/2020 08/01/2020 07/25/2020  Weight (lbs) 170 lb 170 lb 171 lb  Weight (kg) 77.111 kg 77.111 kg 77.565 kg     Body mass index is 23.71 kg/m.  General:  Elderly male well developed, in no acute distress HEENT: normal Lymph: no adenopathy Neck: no JVD Endocrine:  No thryomegaly Vascular: No carotid bruits; FA pulses 2+ bilaterally without bruits  Cardiac:  normal S1, S2; RRR; II/VI systolic crescendo Lungs:  clear to auscultation bilaterally, no wheezing, rhonchi or rales  Abd: soft, nontender, no hepatomegaly  Ext: no edema Musculoskeletal:  No deformities, BUE and BLE strength normal and equal Skin: warm and dry  Neuro:  CNs 2-12 intact, no focal abnormalities noted Psych:  Normal affect   EKG:  The ECG that was done  was personally reviewed and demonstrates Sinus tachycardia rate 116 bpm  Laboratory Data:  High Sensitivity Troponin:   Recent Labs  Lab 08/04/20 0237  TROPONINIHS 44*      Chemistry Recent Labs   Lab 08/05/20 0331 08/06/20 0304  NA 136 136  K 3.6 3.6  CL 103 103  CO2 21* 23  GLUCOSE 127* 120*  BUN 31* 26*  CREATININE 1.64* 1.63*  CALCIUM 7.5* 7.6*  GFRNONAA 43* 44*  ANIONGAP 12 10    Recent Labs  Lab 08/03/20 1756 08/04/20 0237  PROT 6.0* 5.8*  ALBUMIN 3.0* 2.7*  AST 62* 46*  ALT 19 17  ALKPHOS 83 65  BILITOT 1.2 1.0   Hematology Recent Labs  Lab 08/05/20 0331 08/06/20 0304  WBC 11.4* 9.3  RBC 3.14* 2.96*  HGB 8.9* 8.5*  HCT 28.9* 26.6*  MCV 92.0 89.9  MCH 28.3 28.7  MCHC 30.8 32.0  RDW 15.8* 15.6*  PLT 450* 442*   BNP Recent Labs  Lab 08/04/20 0237  BNP 656.9*    DDimer No results for input(s): DDIMER in the last 168 hours.   Radiology/Studies:   Left/Right Heart Catheterizations: Date: 07/04/20 Results:  Ost LAD to Prox LAD lesion is 40% stenosed.  2nd Diag lesion is 90% stenosed.  1st Diag-1 lesion is 95% stenosed.  1st Diag-2 lesion is 90% stenosed with 90% stenosed side branch in Lat 1st Diag.  Mid LAD-1 lesion is 90% stenosed.  Mid LAD-2 lesion is 70% stenosed.  Lat 2nd Mrg lesion is 95% stenosed.  1st Mrg lesion is 20% stenosed.  Prox RCA lesion is 95% stenosed.  Dist LAD lesion is 50% stenosed.    New inferoapical Thombus ECHOCARDIOGRAM COMPLETE  Result Date: 08/06/2020    ECHOCARDIOGRAM REPORT   Patient Name:   Patrick Miranda Date of Exam: 08/06/2020 Medical Rec #:  [id number]          Height:       71.0 in Accession #:    [id number]         Weight:       170.0 lb Date of Birth:  [birth year]          BSA:          1.968 m Patient Age:    15  years           BP:           126/65 mmHg Patient Gender: M                  HR:           90 bpm. Exam Location:  Inpatient Procedure: 2D Echo, 3D Echo, Color Doppler and Cardiac Doppler Indications:    Fever  History:        Patient has prior history of Echocardiogram examinations, most                 recent 07/09/2020. Prior CABG, Arrythmias:Atrial Fibrillation;                  Risk Factors:Hypertension, Diabetes, Dyslipidemia and Sleep                 Apnea.  Sonographer:    Raquel Sarna Senior RDCS Referring Phys: [phone] BELKYS A REGALADO IMPRESSIONS  1. Probable LV apical thrombus. 1.5 x 1.5 cm, with heterogeneous echo texture. Centrally more echo lucent.  2. Left ventricular ejection fraction, by estimation, is 45%. The left ventricle has mildly decreased function. The left ventricle demonstrates regional wall motion abnormalities, large basal inferior and inferoseptal aneurysm, which appears unchanged compared to intraoperative TEE. Left ventricular diastolic parameters are indeterminate.  3. Right ventricular systolic function is moderately reduced. The right ventricular size is normal. There is normal pulmonary artery systolic pressure. The estimated right ventricular systolic pressure is 95.1 mmHg.  4. The mitral valve is grossly normal. Mild mitral valve regurgitation.  5. The aortic valve is grossly normal. Aortic valve regurgitation is mild. No aortic stenosis is present.  6. Aortic dilatation noted. There is borderline dilatation of the ascending aorta, measuring 38 mm.  7. The inferior vena cava is normal in size with greater than 50% respiratory variability, suggesting right atrial pressure of 3 mmHg. Conclusion(s)/Recommendation(s): Critical findings reported to Dr. Zorita Pang and acknowledged at 4:26 PM 08/06/20. FINDINGS  Left Ventricle: Left ventricular ejection fraction, by estimation, is 45%. The left ventricle has mildly decreased function. The left ventricle demonstrates regional wall motion abnormalities. The left ventricular internal cavity size was normal in size. There is no left ventricular hypertrophy. Left ventricular diastolic parameters are indeterminate.  LV Wall Scoring: The basal inferolateral segment is aneurysmal. The mid inferolateral segment, apical septal segment, and apical inferior segment are akinetic. The apical lateral segment and apical anterior segment  are hypokinetic. Right Ventricle: The right ventricular size is normal. No increase in right ventricular wall thickness. Right ventricular systolic function is moderately reduced. There is normal pulmonary artery systolic pressure. The tricuspid regurgitant velocity is 2.36 m/s, and with an assumed right atrial pressure of 3 mmHg, the estimated right ventricular systolic pressure is 88.4 mmHg. Left Atrium: Left atrial size was normal in size. Right Atrium: Right atrial size was normal in size. Pericardium: Trivial pericardial effusion is present. Mitral Valve: The mitral valve is grossly normal. Mild mitral valve regurgitation. Tricuspid Valve: The tricuspid valve is normal in structure. Tricuspid valve regurgitation is trivial. Aortic Valve: The aortic valve is grossly normal. Aortic valve regurgitation is mild. Aortic regurgitation PHT measures 412 msec. No aortic stenosis is present. Pulmonic Valve: The pulmonic valve was grossly normal. Pulmonic valve regurgitation is trivial. Aorta: Aortic dilatation noted. There is borderline dilatation of the ascending aorta, measuring 38 mm. Venous: The inferior vena cava is normal in size with greater than 50% respiratory variability,  suggesting right atrial pressure of 3 mmHg. IAS/Shunts: No atrial level shunt detected by color flow Doppler.  LEFT VENTRICLE PLAX 2D LVIDd:         4.90 cm  Diastology LVIDs:         3.90 cm  LV e' medial:    5.11 cm/s LV PW:         1.20 cm  LV E/e' medial:  15.8 LV IVS:        1.00 cm  LV e' lateral:   10.40 cm/s LVOT diam:     2.00 cm  LV E/e' lateral: 7.8 LV SV:         68 LV SV Index:   35 LVOT Area:     3.14 cm  RIGHT VENTRICLE RV S prime:     6.96 cm/s TAPSE (M-mode): 1.0 cm LEFT ATRIUM             Index       RIGHT ATRIUM           Index LA diam:        4.00 cm 2.03 cm/m  RA Area:     20.80 cm LA Vol (A2C):   39.3 ml 19.97 ml/m RA Volume:   53.60 ml  27.24 ml/m LA Vol (A4C):   63.4 ml 32.22 ml/m LA Biplane Vol: 50.8 ml 25.82  ml/m  AORTIC VALVE LVOT Vmax:   126.00 cm/s LVOT Vmean:  97.600 cm/s LVOT VTI:    0.218 m AI PHT:      412 msec  AORTA Ao Root diam: 3.50 cm Ao Asc diam:  3.80 cm MITRAL VALVE               TRICUSPID VALVE MV Area (PHT): 3.39 cm    TR Peak grad:   22.3 mmHg MV Decel Time: 224 msec    TR Vmax:        236.00 cm/s MV E velocity: 80.60 cm/s MV A velocity: 85.30 cm/s  SHUNTS MV E/A ratio:  0.94        Systemic VTI:  0.22 m                            Systemic Diam: 2.00 cm Cherlynn Kaiser MD Electronically signed by Cherlynn Kaiser MD Signature Date/Time: 08/06/2020/4:34:29 PM    Final    VAS [place] LOWER EXTREMITY VENOUS (DVT)  Result Date: 08/06/2020  Lower Venous DVT Study Indications: Edema.  Comparison Study: No prior venous exams Performing Technologist: Rogelia Rohrer  Examination Guidelines: A complete evaluation includes B-mode imaging, spectral Doppler, color Doppler, and power Doppler as needed of all accessible portions of each vessel. Bilateral testing is considered an integral part of a complete examination. Limited examinations for reoccurring indications may be performed as noted. The reflux portion of the exam is performed with the patient in reverse Trendelenburg.  +---------+---------------+---------+-----------+----------+--------------+ RIGHT    CompressibilityPhasicitySpontaneityPropertiesThrombus Aging +---------+---------------+---------+-----------+----------+--------------+ CFV      Full           Yes      Yes                                 +---------+---------------+---------+-----------+----------+--------------+ SFJ      Full                                                        +---------+---------------+---------+-----------+----------+--------------+  FV Prox  Full           Yes      Yes                                 +---------+---------------+---------+-----------+----------+--------------+ FV Mid   Full           Yes      Yes                                  +---------+---------------+---------+-----------+----------+--------------+ FV DistalFull           Yes      Yes                                 +---------+---------------+---------+-----------+----------+--------------+ PFV      Full                                                        +---------+---------------+---------+-----------+----------+--------------+ POP      Full           Yes      Yes                                 +---------+---------------+---------+-----------+----------+--------------+ PTV      Full                                                        +---------+---------------+---------+-----------+----------+--------------+ PERO     Full                                                        +---------+---------------+---------+-----------+----------+--------------+   +---------+---------------+---------+-----------+----------+--------------+ LEFT     CompressibilityPhasicitySpontaneityPropertiesThrombus Aging +---------+---------------+---------+-----------+----------+--------------+ CFV      Full           Yes      Yes                                 +---------+---------------+---------+-----------+----------+--------------+ SFJ      Full                                                        +---------+---------------+---------+-----------+----------+--------------+ FV Prox  Full           Yes      Yes                                 +---------+---------------+---------+-----------+----------+--------------+ FV Mid   Full  Yes      Yes                                 +---------+---------------+---------+-----------+----------+--------------+ FV DistalFull           Yes      Yes                                 +---------+---------------+---------+-----------+----------+--------------+ PFV      Full                                                         +---------+---------------+---------+-----------+----------+--------------+ POP      Full           Yes      Yes                                 +---------+---------------+---------+-----------+----------+--------------+ PTV      Full                                                        +---------+---------------+---------+-----------+----------+--------------+ PERO     Full                                                        +---------+---------------+---------+-----------+----------+--------------+     Summary: BILATERAL: - No evidence of deep vein thrombosis seen in the lower extremities, bilaterally. - RIGHT: - No cystic structure found in the popliteal fossa.  LEFT: - No cystic structure found in the popliteal fossa.  *See table(s) above for measurements and observations.    Preliminary    CT CHEST ABDOMEN PELVIS WO CONTRAST  Result Date: 08/06/2020 CLINICAL DATA:  Abdominal pain. Nausea and vomiting. Fever of unknown origin EXAM: CT CHEST, ABDOMEN AND PELVIS WITHOUT CONTRAST TECHNIQUE: Multidetector CT imaging of the chest, abdomen and pelvis was performed following the standard protocol without IV contrast. COMPARISON:  AP CT on 08/03/2020, and chest CT on 07/06/2020 FINDINGS: CT CHEST FINDINGS Cardiovascular: No acute findings. Stable mild pericardial thickening. Aortic and coronary atherosclerotic calcification noted. Prior CABG. Mediastinum/Lymph Nodes: No masses or pathologically enlarged lymph nodes identified on this unenhanced exam. Lungs/Pleura: Tiny bilateral pleural effusions and dependent bibasilar atelectasis are new since previous study. No evidence of pulmonary consolidation or mass. Central tracheobronchial airways are patent. Musculoskeletal:  No suspicious bone lesions identified. CT ABDOMEN AND PELVIS FINDINGS Hepatobiliary: No masses visualized on this unenhanced exam. High attenuation sludge and possible tiny calculi noted within the gallbladder, however there  is no evidence of cholecystitis or biliary ductal dilatation. Pancreas: No mass or inflammatory changes identified on this unenhanced exam. Spleen:  Within normal limits in size. Adrenals/Urinary Tract: A tiny 1-2 mm calculus is seen in the midpole the left kidney. No evidence of ureteral calculi or hydronephrosis. Bilateral renal sinus  cysts are again noted. A Foley catheter is seen within the bladder which is collapsed. Stomach/Bowel: Moderate bowel wall thickening is seen involving multiple distal small bowel loops. Soft tissue stranding is seen throughout the small bowel mesentery, with tiny amount of ascites in Morison's pouch and pelvic cul-de-sac. No evidence of bowel obstruction or abscess. Vascular/Lymphatic: No pathologically enlarged lymph nodes identified. No abdominal aortic aneurysm. Aortic atherosclerotic calcification noted. Reproductive:  No masses or other significant abnormality. Other: Stable tiny paraumbilical ventral hernia, which contains only fat. No evidence of herniated bowel loops. Musculoskeletal:  No suspicious bone lesions identified. IMPRESSION: Moderate thickening involving multiple distal small bowel loops, consistent with enteritis. Associated mesenteric inflammatory changes and tiny amount of free fluid. No evidence of abscess. Tiny bilateral pleural effusions and dependent bibasilar atelectasis. Tiny nonobstructing left renal calculus. No evidence of ureteral calculi or hydronephrosis. Cholelithiasis and possible gallbladder sludge. No radiographic evidence of cholecystitis. Stable tiny paraumbilical ventral hernia, which contains only fat. Aortic Atherosclerosis (ICD10-I70.0). Electronically Signed   By: Marlaine Hind M.D.   On: 08/06/2020 14:11     Assessment and Plan:   New LV Thrombus: - likely secondary to aneurysm & remodeling - would start heparin - Discussed with patient:  Standards of care would be coumadin, only small trials have shown non-inferiority of eliquis  and this would be an off label use, though if questions of adherence this may be reasonable - when there is a clear etiology of his abdominal pain and no plans for surgery are being considered, would transition to coumadin  Paroxsymal Atrial Fibrillation,  Prior PE, Prior Stroke, DM, HTN, CAD as below, HFmrEF as below - CHADSVASC=8. - TSH 2.52, imaging notable for no left atrial dilation - Continue anticoagulation with heparin for now; as above.  - Continue rate control with metoprolol as below (presently in SR) - Rhythm control options: malaise, weakness,nausea and vomiting on amiodarone; will attempt rate control if possible   Heart Failure mildly reduced Ejection Fraction/Ischemic cardiomyopathy  CKD IIIa - NYHA class II, Stage B, euvolemic, etiology from ischemic disease - Diuretic regimen: euvolemic presently - Strict I/Os, daily weights, and fluid restriction of < 2 L  - Replace electrolytes PRN and keep K>4 and Mg>2. - reasonable to start metoprolol succinate 50 mg PO daily (changed) - ARNI/ARB/ACE as outpatient as renal function allows - aldactone as outpatient as renal function allows - SGLT2i taken with combination pill as home medication (thought dose is only 5 mg)  Coronary Artery Disease; Obstructive HLD - asymptomatic  - anatomy: LIMA to LAD:  SVG X 3: unclear anatomy but suspect D1 and OM bypass - continue ASA 81 mg (changed) - continue rosuvastatin, goal LDL < 70 - continue BB as above  Discussed with patient and showed images, discussed with wife and daughter over the phone, discussed with primary MD  Will continue to follow  For questions or updates, please contact [facility] Please consult [email or web address] for contact info under     Signed, Werner Lean, MD  08/06/2020 4:58 PM

## 2020-08-06 NOTE — Progress Notes (Signed)
Progress Note     Subjective: Patient reports abdominal pain is slightly improved from yesterday. Still having some nausea but no emesis. +flatus but no BM. Has not been ambulating. Denies cough or SOB. Denies urinary symptoms. Febrile overnight to 102 and low grade temp of 100 this AM.   Objective: Vital signs in last 24 hours: Temp:  [98.5 F (36.9 C)-102.7 F (39.3 C)] 100 F (37.8 C) (01/17 1014) Pulse Rate:  [83-108] 95 (01/17 1014) Resp:  [16-18] 16 (01/17 1014) BP: (102-145)/(53-79) 126/65 (01/17 1014) SpO2:  [92 %-96 %] 94 % (01/17 1014) Last BM Date: 08/03/20  Intake/Output from previous day: 01/16 0701 - 01/17 0700 In: 1470.5 [P.O.:1020; I.V.:450.5] Out: 1800 [Urine:1800] Intake/Output this shift: Total I/O In: 120 [P.O.:120] Out: -   PE: General: pleasant, WD, WN male who is laying in bed in NAD HEENT: head is normocephalic, atraumatic.  Sclera are anicteric Heart: regular, rate, and rhythm.   Lungs: CTAB, no wheezes, rhonchi, or rales noted.  Respiratory effort nonlabored Abd: soft, diffusely ttp, moderately distended, BS hypoactive, small umbilical hernia without overlying erythema  MS: all 4 extremities are symmetrical with no cyanosis, clubbing, or edema. Skin: warm and dry with no masses, lesions, or rashes Neuro: Cranial nerves 2-12 grossly intact, sensation is normal throughout Psych: A&Ox3 with an appropriate affect.    Lab Results:  Recent Labs    08/05/20 0331 08/06/20 0304  WBC 11.4* 9.3  HGB 8.9* 8.5*  HCT 28.9* 26.6*  PLT 450* 442*   BMET Recent Labs    08/05/20 0331 08/06/20 0304  NA 136 136  K 3.6 3.6  CL 103 103  CO2 21* 23  GLUCOSE 127* 120*  BUN 31* 26*  CREATININE 1.64* 1.63*  CALCIUM 7.5* 7.6*   PT/INR Recent Labs    08/03/20 1756  LABPROT 15.2  INR 1.3*   CMP     Component Value Date/Time   NA 136 08/06/2020 0304   K 3.6 08/06/2020 0304   CL 103 08/06/2020 0304   CO2 23 08/06/2020 0304   GLUCOSE 120 (H)  08/06/2020 0304   BUN 26 (H) 08/06/2020 0304   CREATININE 1.63 (H) 08/06/2020 0304   CALCIUM 7.6 (L) 08/06/2020 0304   PROT 5.8 (L) 08/04/2020 0237   ALBUMIN 2.7 (L) 08/04/2020 0237   AST 46 (H) 08/04/2020 0237   ALT 17 08/04/2020 0237   ALKPHOS 65 08/04/2020 0237   BILITOT 1.0 08/04/2020 0237   GFRNONAA 44 (L) 08/06/2020 0304   GFRAA >60 03/12/2020 0959   Lipase  No results found for: LIPASE     Studies/Results: DG Abd 1 View  Result Date: 08/05/2020 CLINICAL DATA:  Insert epic study no EXAM: ABDOMEN - 1 VIEW COMPARISON:  Abdominal radiograph 08/04/2020 FINDINGS: Persistent gaseous distension of both small and large bowel loops, similar to prior, most likely representing ileus. No pneumatosis or supine evidence of free air. No unexpected radiopaque calcification. No acute finding in the visualized skeleton. IMPRESSION: Persistent gaseous distension of both small and large bowel loops, similar to prior, most likely representing ileus. Electronically Signed   By: Audie Pinto M.D.   On: 08/05/2020 10:07   DG Abd 1 View  Result Date: 08/04/2020 CLINICAL DATA:  Abdominal pain and vomiting EXAM: ABDOMEN - 1 VIEW COMPARISON:  08/03/2013 CT abdomen/pelvis. FINDINGS: A few mildly dilated small bowel loops throughout the central and left abdomen, up to 3.6 cm diameter, new since CT from 1 day prior. New prominent gaseous distention  of the colon in the right lower quadrant, probably the sigmoid colon. No evidence of pneumatosis or pneumoperitoneum. Mild diffuse colonic stool. No radiopaque nephrolithiasis. Moderate degenerative changes throughout the lumbar spine. Excreted contrast in the urinary bladder. IMPRESSION: Prominent gaseous distention of the colon in the right lower quadrant, probably the sigmoid colon. A few mildly dilated small bowel loops throughout the central and left abdomen. These findings are new since CT from 1 day prior, favoring a mild adynamic ileus, with developing  partial distal small bowel obstruction not entirely excluded. Electronically Signed   By: Ilona Sorrel M.D.   On: 08/04/2020 10:39    Anti-infectives: Anti-infectives (From admission, onward)   Start     Dose/Rate Route Frequency Ordered Stop   08/06/20 1000  cefTRIAXone (ROCEPHIN) 2 g in sodium chloride 0.9 % 100 mL IVPB        2 g 200 mL/hr over 30 Minutes Intravenous Every 24 hours 08/06/20 0724     08/05/20 1200  cefTRIAXone (ROCEPHIN) 1 g in sodium chloride 0.9 % 100 mL IVPB  Status:  Discontinued        1 g 200 mL/hr over 30 Minutes Intravenous Every 24 hours 08/05/20 1103 08/06/20 0724   08/03/20 2330  piperacillin-tazobactam (ZOSYN) IVPB 3.375 g  Status:  Discontinued       "Followed by" Linked Group Details   3.375 g 12.5 mL/hr over 240 Minutes Intravenous Every 8 hours 08/03/20 1715 08/03/20 2223   08/03/20 1730  piperacillin-tazobactam (ZOSYN) IVPB 3.375 g       "Followed by" Linked Group Details   3.375 g 100 mL/hr over 30 Minutes Intravenous  Once 08/03/20 1715 08/03/20 2002       Assessment/Plan Hx of PE may 2021 Hx of COVID may 2021 HTN HLD Hx of NSTEMI T2DM Hard of hearing Depression  Fat containing umbilical hernia - stable, no indication for emergent surgical intervention   Ileus from recent gastroenteritis - patient passing flatus, no BM, tolerating CLD - abdomen tender but soft - recommend mobilizing today, recommend keeping Mg >2.0 and K >4.0  Fever - febrile to 102 overnight, no leukocytosis - spoke with hospitalist this AM, planning CT chest - can go ahead and rescan abdomen as well - consider [place] to r/o DVT with hx of PE as well - urine cx with no growth   FEN: CLD, IVF @45  mL/hr VTE: lovenox ID: [email or web address] 1/16>>  LOS: 2 days    Norm Parcel , [facility] Surgery 08/06/2020, 10:17 AM Please see Amion for pager number during day hours 7:00am-4:30pm

## 2020-08-06 NOTE — Progress Notes (Addendum)
PROGRESS NOTE    Patrick Miranda  GQQ:[id number] DOB: 03-28-1946 DOA: 08/03/2020 PCP: Burnard Bunting, MD   Brief Narrative: 75 year old with past medical history significant for CAD status post CABG 06/2020, obstructive sleep apnea, paroxysmal A. fib, insulin-dependent diabetes type 2, hypertension, chronic kidney disease stage IIIa who presents to [facility] complaining of nausea vomiting and abdominal pain.  Patient explains that the morning of admission after eating breakfast fried egg, 48 days old rice and refrigerated already open can of peaches he began experience abdominal pain.  Symptoms accompanied by multiple episodes of vomiting.  Symptoms persisted and eventually patient presented to [facility].  On evaluation patient was found to be tachycardic, tachypneic with a fever of 101.  Mild leukocytosis 11.5.  Assessment & Plan:   Principal Problem:   Abdominal pain Active Problems:   Essential hypertension   AF (paroxysmal atrial fibrillation) (HCC)   Coronary artery disease involving native heart   Chronic kidney disease, stage 3a (HCC)   Mixed diabetic hyperlipidemia associated with type 2 diabetes mellitus ([place])   Uncontrolled type 2 diabetes mellitus with hyperglycemia, with long-term current use of insulin (HCC)   Foodborne gastroenteritis   SIRS (systemic inflammatory response syndrome) (HCC)  1-Abdominal Pain, Nausea, Vomiting:  -? Gastroenteritis ?   Foodborne illness, but wife report patient was not feeling well on Thursday day prior events.  -CT abdomen Pelvis: Aortic atherosclerosis. Small fat containing periumbilical hernia. No acute abnormalities.  -KUB with ileus vs SBO. Surgery following.  -Continue with clear diet.  -KUB 1/16 distension of Bowel consistent with ileus.  -Spike fever last night, passing gas, mild abdominal pain. Plan to repeat CT abdomen pelvis.    SIRS;  Could be related to gastroenteritis.  Blood culture: No growth,  urine culture: no growth.  Unable to send stool sample, diarrhea resolved.  Plan to check CT chest and CT abdomen pelvis.  Doppler negative for DVT>  Will repeat blood culture if he spike fever.  Will check ECHO.  Doppler negative for DVT.   A fib; Continue with metoprolol.   Ventricular Thrombus on ECHO;  Plan to start Heparin Gtt.  Cardiology has been consulted.   CAD S/P CABG;  Had CABG 06/2020. Follow up with Dr Nils Pyle.  Informed CVTS of admission.   CKD stage 3 a; Cr range 1.4---1.6 Remain stable.  Monitor.   Diabetes type 2;  Continue with lantus and SSI>   Thrombocytosis;  Continue with aspirin.  Lower than 2 weeks ago, platelet count at that time was 1,004  Mild elevation troponin; denies chest pain.     Estimated body mass index is 23.71 kg/m as calculated from the following:   Height as of 08/01/20: 5\' 11"  (1.803 m).   Weight as of 08/01/20: 77.1 kg.   DVT prophylaxis: Lovenox Code Status: Full code Family Communication: wife over the phone Disposition Plan:  Status is: inpatient.   The patient remains OBS appropriate and will d/c before 2 midnights.  Dispo: The patient is from: Home              Anticipated d/c is to: Home              Anticipated d/c date is: 2 days              Patient currently is not medically stable to d/c.        Consultants:   Surgery   Procedures:   none  Antimicrobials:  Subjective: He denies nausea, still having abdominal pain. Rate pain 5/10, yesterday was 7/10. He is passing gas today.    Objective: Vitals:   08/06/20 0000 08/06/20 0237 08/06/20 0654 08/06/20 1014  BP: (!) 102/53 117/60 135/68 126/65  Pulse: 83 87 92 95  Resp: 16 16 18 16   Temp: 99.2 F (37.3 C) 98.5 F (36.9 C) 99.4 F (37.4 C) 100 F (37.8 C)  TempSrc: Oral Oral Oral Oral  SpO2: 96% 94% 93% 94%    Intake/Output Summary (Last 24 hours) at 08/06/2020 1018 Last data filed at 08/06/2020 0900 Gross per 24 hour  Intake  1125.58 ml  Output 1800 ml  Net -674.42 ml   There were no vitals filed for this visit.  Examination:  General exam: NAD Respiratory system: CTA Cardiovascular system: S 1, S 2 RRR Gastrointestinal system: BS present, soft, nt Central nervous system: Alert, following command Extremities: Symmetric power.     Data Reviewed: I have personally reviewed following labs and imaging studies  CBC: Recent Labs  Lab 08/03/20 1756 08/04/20 0237 08/05/20 0331 08/06/20 0304  WBC 11.5* 10.6* 11.4* 9.3  NEUTROABS 10.2* 8.9*  --   --   HGB 10.0* 9.2* 8.9* 8.5*  HCT 31.4* 28.7* 28.9* 26.6*  MCV 91.8 91.7 92.0 89.9  PLT 636* 539* 450* [id number]*   Basic Metabolic Panel: Recent Labs  Lab 08/03/20 1756 08/04/20 0237 08/05/20 0331 08/06/20 0304  NA 140 138 136 136  K 4.3 4.1 3.6 3.6  CL 105 105 103 103  CO2 17* 19* 21* 23  GLUCOSE 212* 168* 127* 120*  BUN 27* 27* 31* 26*  CREATININE 1.51* 1.58* 1.64* 1.63*  CALCIUM 8.6* 8.4* 7.5* 7.6*  MG  --  1.7  --   --   PHOS  --  4.6  --   --    GFR: Estimated Creatinine Clearance: 41.7 mL/min (A) (by C-G formula based on SCr of 1.63 mg/dL (H)). Liver Function Tests: Recent Labs  Lab 08/03/20 1756 08/04/20 0237  AST 62* 46*  ALT 19 17  ALKPHOS 83 65  BILITOT 1.2 1.0  PROT 6.0* 5.8*  ALBUMIN 3.0* 2.7*   No results for input(s): LIPASE, AMYLASE in the last 168 hours. No results for input(s): AMMONIA in the last 168 hours. Coagulation Profile: Recent Labs  Lab 08/03/20 1756  INR 1.3*   Cardiac Enzymes: No results for input(s): CKTOTAL, CKMB, CKMBINDEX, TROPONINI in the last 168 hours. BNP (last 3 results) No results for input(s): PROBNP in the last 8760 hours. HbA1C: No results for input(s): HGBA1C in the last 72 hours. CBG: Recent Labs  Lab 08/05/20 0646 08/05/20 1057 08/05/20 1621 08/05/20 2203 08/06/20 0655  GLUCAP 149* 200* 161* 164* 145*   Lipid Profile: No results for input(s): CHOL, HDL, LDLCALC, TRIG, CHOLHDL,  LDLDIRECT in the last 72 hours. Thyroid Function Tests: No results for input(s): TSH, T4TOTAL, FREET4, T3FREE, THYROIDAB in the last 72 hours. Anemia Panel: No results for input(s): VITAMINB12, FOLATE, FERRITIN, TIBC, IRON, RETICCTPCT in the last 72 hours. Sepsis Labs: Recent Labs  Lab 08/03/20 1756 08/04/20 0237  LATICACIDVEN 1.8 1.4    Recent Results (from the past 240 hour(s))  Blood Culture (routine x 2)     Status: None (Preliminary result)   Collection Time: 08/03/20  5:09 PM   Specimen: BLOOD  Result Value Ref Range Status   Specimen Description BLOOD SITE NOT SPECIFIED  Final   Special Requests   Final    BOTTLES DRAWN  AEROBIC AND ANAEROBIC Blood Culture adequate volume   Culture   Final    NO GROWTH 2 DAYS Performed at [facility], [place] [address] [place], [place] [address]    Report Status PENDING  Incomplete  Blood Culture (routine x 2)     Status: None (Preliminary result)   Collection Time: 08/03/20  5:57 PM   Specimen: BLOOD  Result Value Ref Range Status   Specimen Description BLOOD SITE NOT SPECIFIED  Final   Special Requests   Final    BOTTLES DRAWN AEROBIC AND ANAEROBIC Blood Culture adequate volume   Culture   Final    NO GROWTH 2 DAYS Performed at [facility], [address] [place], [place] [address]    Report Status PENDING  Incomplete  Urine culture     Status: Abnormal   Collection Time: 08/03/20  7:14 PM   Specimen: In/Out Cath Urine  Result Value Ref Range Status   Specimen Description IN/OUT CATH URINE  Final   Special Requests   Final    NONE Performed at [facility], [place] [address] [place], [place] [address]    Culture MULTIPLE SPECIES PRESENT, SUGGEST RECOLLECTION (A)  Final   Report Status 08/05/2020 FINAL  Final  Resp Panel by RT-PCR (Flu A&B, Covid)     Status: None   Collection Time: 08/03/20  7:14 PM   Specimen: Nasopharyngeal(NP) swabs in vial transport medium  Result Value Ref Range Status   SARS  Coronavirus 2 by RT PCR NEGATIVE NEGATIVE Final    Comment: (NOTE) SARS-CoV-2 target nucleic acids are NOT DETECTED.  The SARS-CoV-2 RNA is generally detectable in upper respiratory specimens during the acute phase of infection. The lowest concentration of SARS-CoV-2 viral copies this assay can detect is 138 copies/mL. A negative result does not preclude SARS-Cov-2 infection and should not be used as the sole basis for treatment or other patient management decisions. A negative result may occur with  improper specimen collection/handling, submission of specimen other than nasopharyngeal swab, presence of viral mutation(s) within the areas targeted by this assay, and inadequate number of viral copies(<138 copies/mL). A negative result must be combined with clinical observations, patient history, and epidemiological information. The expected result is Negative.  Fact Sheet for Patients:  EntrepreneurPulse.com.au  Fact Sheet for Healthcare Providers:  IncredibleEmployment.be  This test is no t yet approved or cleared by the [place] FDA and  has been authorized for detection and/or diagnosis of SARS-CoV-2 by FDA under an Emergency Use Authorization (EUA). This EUA will remain  in effect (meaning this test can be used) for the duration of the COVID-19 declaration under Section 564(b)(1) of the Act, 21 U.S.C.section 360bbb-3(b)(1), unless the authorization is terminated  or revoked sooner.       Influenza A by PCR NEGATIVE NEGATIVE Final   Influenza B by PCR NEGATIVE NEGATIVE Final    Comment: (NOTE) The Xpert Xpress SARS-CoV-2/FLU/RSV plus assay is intended as an aid in the diagnosis of influenza from Nasopharyngeal swab specimens and should not be used as a sole basis for treatment. Nasal washings and aspirates are unacceptable for Xpert Xpress SARS-CoV-2/FLU/RSV testing.  Fact Sheet for  Patients: EntrepreneurPulse.com.au  Fact Sheet for Healthcare Providers: IncredibleEmployment.be  This test is not yet approved or cleared by the [place] FDA and has been authorized for detection and/or diagnosis of SARS-CoV-2 by FDA under an Emergency Use Authorization (EUA). This EUA will remain in effect (meaning this test can  be used) for the duration of the COVID-19 declaration under Section 564(b)(1) of the Act, 21 U.S.C. section 360bbb-3(b)(1), unless the authorization is terminated or revoked.  Performed at [facility], [place] [address], [place], [place] [address]   Urine Culture     Status: None   Collection Time: 08/05/20  3:30 PM   Specimen: Urine, Random  Result Value Ref Range Status   Specimen Description URINE, RANDOM  Final   Special Requests NONE  Final   Culture   Final    NO GROWTH Performed at [facility], [place] [address] [place], [place] [address]    Report Status 08/06/2020 FINAL  Final         Radiology Studies: DG Abd 1 View  Result Date: 08/05/2020 CLINICAL DATA:  Insert epic study no EXAM: ABDOMEN - 1 VIEW COMPARISON:  Abdominal radiograph 08/04/2020 FINDINGS: Persistent gaseous distension of both small and large bowel loops, similar to prior, most likely representing ileus. No pneumatosis or supine evidence of free air. No unexpected radiopaque calcification. No acute finding in the visualized skeleton. IMPRESSION: Persistent gaseous distension of both small and large bowel loops, similar to prior, most likely representing ileus. Electronically Signed   By: Audie Pinto M.D.   On: 08/05/2020 10:07   DG Abd 1 View  Result Date: 08/04/2020 CLINICAL DATA:  Abdominal pain and vomiting EXAM: ABDOMEN - 1 VIEW COMPARISON:  08/03/2013 CT abdomen/pelvis. FINDINGS: A few mildly dilated small bowel loops throughout the central and left abdomen, up to 3.6 cm diameter, new since CT from 1 day prior. New  prominent gaseous distention of the colon in the right lower quadrant, probably the sigmoid colon. No evidence of pneumatosis or pneumoperitoneum. Mild diffuse colonic stool. No radiopaque nephrolithiasis. Moderate degenerative changes throughout the lumbar spine. Excreted contrast in the urinary bladder. IMPRESSION: Prominent gaseous distention of the colon in the right lower quadrant, probably the sigmoid colon. A few mildly dilated small bowel loops throughout the central and left abdomen. These findings are new since CT from 1 day prior, favoring a mild adynamic ileus, with developing partial distal small bowel obstruction not entirely excluded. Electronically Signed   By: Ilona Sorrel M.D.   On: 08/04/2020 10:39        Scheduled Meds: . aspirin  325 mg Oral Daily  . Chlorhexidine Gluconate Cloth  6 each Topical Daily  . enoxaparin (LOVENOX) injection  40 mg Subcutaneous Q24H  . fenofibrate  160 mg Oral Daily  . insulin aspart  0-15 Units Subcutaneous TID AC & HS  . insulin glargine  15 Units Subcutaneous Daily  . metoprolol tartrate  25 mg Oral BID  . rosuvastatin  20 mg Oral Daily  . tamsulosin  0.4 mg Oral Daily   Continuous Infusions: . sodium chloride 45 mL/hr (08/05/20 1618)  . cefTRIAXone (ROCEPHIN)  IV 2 g (08/06/20 0844)     LOS: 2 days    Time spent: 35 minutes.     Elmarie Shiley, MD [facility]   If 7PM-7AM, please contact night-coverage [email or web address]  08/06/2020, 10:18 AM

## 2020-08-06 NOTE — Progress Notes (Signed)
ANTICOAGULATION CONSULT NOTE - Initial Consult  Pharmacy Consult for Heparin Indication: LV thrombus  Allergies  Allergen Reactions  . Lipitor [Atorvastatin] Other (See Comments)    Muscle pain    Patient Measurements: Height: 5\' 11"  (180.3 cm) Weight: 77.1 kg (170 lb) IBW/kg (Calculated) : 75.3 Heparin Dosing Weight:  77.1 kg  Vital Signs: Temp: 99.4 F (37.4 C) (01/17 1652) Temp Source: Oral (01/17 1652) BP: 102/58 (01/17 1652) Pulse Rate: 90 (01/17 1652)  Labs: Recent Labs    08/03/20 1756 08/04/20 0237 08/05/20 0331 08/06/20 0304  HGB 10.0* 9.2* 8.9* 8.5*  HCT 31.4* 28.7* 28.9* 26.6*  PLT 636* 539* 450* 442*  APTT 25  --   --   --   LABPROT 15.2  --   --   --   INR 1.3*  --   --   --   CREATININE 1.51* 1.58* 1.64* 1.63*  TROPONINIHS  --  44*  --   --     Estimated Creatinine Clearance: 41.7 mL/min (A) (by C-G formula based on SCr of 1.63 mg/dL (H)).   Medical History: Past Medical History:  Diagnosis Date  . Arthritis   . 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 Cancer (HCC)    Skin  . Cerebral artery disease   . Coronary artery disease   . COVID-07 Dec 2019  . COVID-19 virus infection 12/02/2019  . Depression   . Essential hypertension   . Hernia, umbilical   . HOH (hard of hearing)   . Hyperlipidemia   . NSTEMI (non-ST elevated myocardial infarction) ([place])   . OSA on CPAP   . Pulmonary emboli [facility])    May 2021  . Type 2 diabetes mellitus (HCC)    Assessment: CC/HPI: stomach pressure, N/V, abdominal pain  PMH: HTN, HLD, T2DM, CAD, hx CVA and PE 5/21, hx urgent CABG x4 in 06/2020, CHF, skin ca, COVID 5/21, HOH, OSA, Afib. CKD  Anticoag: ?Eliquis PTA (LF 06/08/20, INR only 1.3). H/o afib and CVA and PE 5/21. 1/17 Doppler negative for DVT. - 1/17 Echo: LV Apical thrombus. Start IV heparin 1/17. Hgb 8.4. Plts 442.  Goal of Therapy:  Heparin level 0.3-0.7 units/ml Monitor platelets by anticoagulation protocol: Yes   Plan:  Start IV  heparin 4000 unit bolus and infusion at 1300 units/hr Check HL in 8 hrs after heparin starts Daily HL and CBC   Kassem Kibbe S. [place], PharmD, BCPS Clinical Staff Pharmacist [email or web address] Alford [place], Tyrrell Stephens Stillinger 08/06/2020,5:21 PM

## 2020-08-06 NOTE — Progress Notes (Signed)
Echocardiogram 2D Echocardiogram has been performed.  Patrick Miranda 08/06/2020, 3:57 PM

## 2020-08-07 DIAGNOSIS — I513 Intracardiac thrombosis, not elsewhere classified: Secondary | ICD-10-CM | POA: Diagnosis not present

## 2020-08-07 DIAGNOSIS — R109 Unspecified abdominal pain: Secondary | ICD-10-CM | POA: Diagnosis not present

## 2020-08-07 LAB — HEPARIN LEVEL (UNFRACTIONATED)
Heparin Unfractionated: <0.1 IU/mL — ABNORMAL LOW (ref 0.30–0.70)
Heparin Unfractionated: 0.2 IU/mL — ABNORMAL LOW (ref 0.30–0.70)
Heparin Unfractionated: <0.1 [IU]/mL — ABNORMAL LOW (ref 0.30–0.70)

## 2020-08-07 LAB — CBC
HCT: 25.9 % — ABNORMAL LOW (ref 39.0–52.0)
Hemoglobin: 8.3 g/dL — ABNORMAL LOW (ref 13.0–17.0)
MCH: 28.5 pg (ref 26.0–34.0)
MCHC: 32 g/dL (ref 30.0–36.0)
MCV: 89 fL (ref 80.0–100.0)
Platelets: 425 10*3/uL — ABNORMAL HIGH (ref 150–400)
RBC: 2.91 MIL/uL — ABNORMAL LOW (ref 4.22–5.81)
RDW: 15.6 % — ABNORMAL HIGH (ref 11.5–15.5)
WBC: 9.7 10*3/uL (ref 4.0–10.5)
nRBC: 0 % (ref 0.0–0.2)

## 2020-08-07 LAB — BASIC METABOLIC PANEL
Anion gap: 14 (ref 5–15)
BUN: 19 mg/dL (ref 8–23)
CO2: 17 mmol/L — ABNORMAL LOW (ref 22–32)
Calcium: 7.5 mg/dL — ABNORMAL LOW (ref 8.9–10.3)
Chloride: 102 mmol/L (ref 98–111)
Creatinine, Ser: 1.36 mg/dL — ABNORMAL HIGH (ref 0.61–1.24)
GFR, Estimated: 54 mL/min — ABNORMAL LOW (ref >60)
Glucose, Bld: 209 mg/dL — ABNORMAL HIGH (ref 70–99)
Potassium: 3.6 mmol/L (ref 3.5–5.1)
Sodium: 133 mmol/L — ABNORMAL LOW (ref 135–145)

## 2020-08-07 LAB — GLUCOSE, CAPILLARY
Glucose-Capillary: 125 mg/dL — ABNORMAL HIGH (ref 70–99)
Glucose-Capillary: 134 mg/dL — ABNORMAL HIGH (ref 70–99)
Glucose-Capillary: 173 mg/dL — ABNORMAL HIGH (ref 70–99)
Glucose-Capillary: 138 mg/dL — ABNORMAL HIGH (ref 70–99)
Glucose-Capillary: 160 mg/dL — ABNORMAL HIGH (ref 70–99)

## 2020-08-07 LAB — MAGNESIUM: Magnesium: 2.2 mg/dL (ref 1.7–2.4)

## 2020-08-07 MED ORDER — HEPARIN BOLUS VIA INFUSION
1000.0000 [IU] | Freq: Once | INTRAVENOUS | Status: AC
  Administered 2020-08-07: 1000 [IU] via INTRAVENOUS
  Filled 2020-08-07: qty 1000

## 2020-08-07 MED ORDER — OXYCODONE-ACETAMINOPHEN 5-325 MG PO TABS: 1.0000 | ORAL_TABLET | Freq: Three times a day (TID) | ORAL | Status: DC | PRN

## 2020-08-07 MED ORDER — HEPARIN BOLUS VIA INFUSION
2000.0000 [IU] | Freq: Once | INTRAVENOUS | Status: AC
  Administered 2020-08-07: 2000 [IU] via INTRAVENOUS
  Filled 2020-08-07: qty 2000

## 2020-08-07 MED ORDER — METOPROLOL SUCCINATE ER 50 MG PO TB24
75.0000 mg | ORAL_TABLET | Freq: Every day | ORAL | Status: DC
  Administered 2020-08-08 – 2020-08-10 (×3): 75 mg via ORAL
  Filled 2020-08-07 (×3): qty 1

## 2020-08-07 NOTE — Progress Notes (Signed)
[facility] for Heparin Indication: LV thrombus  Allergies  Allergen Reactions  . Lipitor [Atorvastatin] Other (See Comments)    Muscle pain    Patient Measurements: Height: 5\' 11"  (180.3 cm) Weight: 77.1 kg (170 lb) IBW/kg (Calculated) : 75.3 Heparin Dosing Weight:  77.1 kg  Vital Signs: Temp: 99 F (37.2 C) (01/18 2048) Temp Source: Oral (01/18 2048) BP: 142/68 (01/18 2048) Pulse Rate: 89 (01/18 2048)  Labs: Recent Labs    08/05/20 0331 08/06/20 0304 08/07/20 0249 08/07/20 0912 08/07/20 2041  HGB 8.9* 8.5* 8.3*  --   --   HCT 28.9* 26.6* 25.9*  --   --   PLT 450* 442* 425*  --   --   HEPARINUNFRC  --   --  <0.10* 0.20* <0.10*  CREATININE 1.64* 1.63*  --  1.36*  --     Estimated Creatinine Clearance: 50 mL/min (A) (by C-G formula based on SCr of 1.36 mg/dL (H)).   Medical History: Past Medical History:  Diagnosis Date  . Arthritis   . 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 Cancer (HCC)    Skin  . Cerebral artery disease   . Coronary artery disease   . COVID-07 Dec 2019  . COVID-19 virus infection 12/02/2019  . Depression   . Essential hypertension   . Hernia, umbilical   . HOH (hard of hearing)   . Hyperlipidemia   . NSTEMI (non-ST elevated myocardial infarction) ([place])   . OSA on CPAP   . Pulmonary emboli [facility])    May 2021  . Type 2 diabetes mellitus (HCC)    Assessment: CC/HPI: stomach pressure, N/V, abdominal pain  PMH: HTN, HLD, T2DM, CAD, hx CVA and PE 5/21, hx urgent CABG x4 in 06/2020, CHF, skin ca, COVID 5/21, HOH, OSA, Afib. CKD  Anticoag: ?Eliquis PTA (LF 06/08/20, INR only 1.3). H/o afib and CVA and PE 5/21. 1/17 Doppler negative for DVT. - 1/17 Echo: LV Apical thrombus. Start IV heparin 1/17. Hgb 8.4. Plts 442  Heparin level thisevening after bolus and rate increase is < 0.10.   Goal of Therapy:  Heparin level 0.3-0.7 units/ml Monitor platelets by anticoagulation protocol: Yes   Plan:   Heparin 1000 units re-bolus Inc heparin to 1750 units/hr Check anti-Xa level in 6 hours with am labs and daily while on heparin Continue to monitor H&H and platelets  Thank you for allowing [place] to participate in this patients care.   Alanda Slim, PharmD, [facility] Clinical Pharmacist Please see AMION for all Pharmacists' Contact Phone Numbers 08/07/2020, 10:38 PM

## 2020-08-07 NOTE — Care Management (Signed)
Benefit check sent for xaralto and eliquis 

## 2020-08-07 NOTE — Progress Notes (Signed)
[place] for Heparin Indication: LV thrombus  Allergies  Allergen Reactions  . Lipitor [Atorvastatin] Other (See Comments)    Muscle pain    Patient Measurements: Height: 5\' 11"  (180.3 cm) Weight: 77.1 kg (170 lb) IBW/kg (Calculated) : 75.3 Heparin Dosing Weight:  77.1 kg  Vital Signs: Temp: 99.4 F (37.4 C) (01/17 2013) Temp Source: Oral (01/17 2013) BP: 127/67 (01/17 2013) Pulse Rate: 99 (01/17 2013)  Labs: Recent Labs    08/05/20 0331 08/06/20 0304 08/07/20 0249  HGB 8.9* 8.5* 8.3*  HCT 28.9* 26.6* 25.9*  PLT 450* 442* 425*  HEPARINUNFRC  --   --  <0.10*  CREATININE 1.64* 1.63*  --     Estimated Creatinine Clearance: 41.7 mL/min (A) (by C-G formula based on SCr of 1.63 mg/dL (H)).   Medical History: Past Medical History:  Diagnosis Date  . Arthritis   . 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 Cancer (HCC)    Skin  . Cerebral artery disease   . Coronary artery disease   . COVID-07 Dec 2019  . COVID-19 virus infection 12/02/2019  . Depression   . Essential hypertension   . Hernia, umbilical   . HOH (hard of hearing)   . Hyperlipidemia   . NSTEMI (non-ST elevated myocardial infarction) ([place])   . OSA on CPAP   . Pulmonary emboli [facility])    May 2021  . Type 2 diabetes mellitus (HCC)    Assessment: CC/HPI: stomach pressure, N/V, abdominal pain  PMH: HTN, HLD, T2DM, CAD, hx CVA and PE 5/21, hx urgent CABG x4 in 06/2020, CHF, skin ca, COVID 5/21, HOH, OSA, Afib. CKD  Anticoag: ?Eliquis PTA (LF 06/08/20, INR only 1.3). H/o afib and CVA and PE 5/21. 1/17 Doppler negative for DVT. - 1/17 Echo: LV Apical thrombus. Start IV heparin 1/17. Hgb 8.4. Plts 442.  1/18 AM update:  Heparin level low  Goal of Therapy:  Heparin level 0.3-0.7 units/ml Monitor platelets by anticoagulation protocol: Yes   Plan:  Heparin 2000 units re-bolus Inc heparin to 1450 units/hr 1200 heparin level   Narda Bonds, PharmD,  [place] Pharmacist Phone: [phone]

## 2020-08-07 NOTE — Progress Notes (Signed)
[place] for Heparin Indication: LV thrombus  Allergies  Allergen Reactions  . Lipitor [Atorvastatin] Other (See Comments)    Muscle pain    Patient Measurements: Height: 5\' 11"  (180.3 cm) Weight: 77.1 kg (170 lb) IBW/kg (Calculated) : 75.3 Heparin Dosing Weight:  77.1 kg  Vital Signs: Temp: 99.4 F (37.4 C) (01/18 0944) Temp Source: Oral (01/18 0944) BP: 137/69 (01/18 0944) Pulse Rate: 96 (01/18 0944)  Labs: Recent Labs    08/05/20 0331 08/06/20 0304 08/07/20 0249 08/07/20 0912  HGB 8.9* 8.5* 8.3*  --   HCT 28.9* 26.6* 25.9*  --   PLT 450* 442* 425*  --   HEPARINUNFRC  --   --  <0.10* 0.20*  CREATININE 1.64* 1.63*  --  1.36*    Estimated Creatinine Clearance: 50 mL/min (A) (by C-G formula based on SCr of 1.36 mg/dL (H)).   Medical History: Past Medical History:  Diagnosis Date  . Arthritis   . 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 Cancer (HCC)    Skin  . Cerebral artery disease   . Coronary artery disease   . COVID-07 Dec 2019  . COVID-19 virus infection 12/02/2019  . Depression   . Essential hypertension   . Hernia, umbilical   . HOH (hard of hearing)   . Hyperlipidemia   . NSTEMI (non-ST elevated myocardial infarction) ([place])   . OSA on CPAP   . Pulmonary emboli [facility])    May 2021  . Type 2 diabetes mellitus (HCC)    Assessment: CC/HPI: stomach pressure, N/V, abdominal pain  PMH: HTN, HLD, T2DM, CAD, hx CVA and PE 5/21, hx urgent CABG x4 in 06/2020, CHF, skin ca, COVID 5/21, HOH, OSA, Afib. CKD  Anticoag: ?Eliquis PTA (LF 06/08/20, INR only 1.3). H/o afib and CVA and PE 5/21. 1/17 Doppler negative for DVT. - 1/17 Echo: LV Apical thrombus. Start IV heparin 1/17. Hgb 8.4. Plts 442  Heparin level this morning after bolus and rate increase is 0.2, though it was drawn a couple of hours early. No issues with infusion or bleeding per RN.    Goal of Therapy:  Heparin level 0.3-0.7 units/ml Monitor  platelets by anticoagulation protocol: Yes   Plan:  Heparin 1000 units re-bolus Inc heparin to 1550 units/hr Check anti-Xa level in 8 hours and daily while on heparin Continue to monitor H&H and platelets    Thank you for allowing [place] to participate in this patients care.   Jens Som, PharmD Please see amion for complete clinical pharmacist phone list. 08/07/2020 12:24 PM

## 2020-08-07 NOTE — TOC Benefit Eligibility Note (Signed)
Transition of Care [facility]) Benefit Eligibility Note    Patient Details  Name: Patrick Miranda MRN: [id number] Date of Birth: [birth year]   Medication/Dose: Jennye Moccasin 15mg  for 21 days then 20mg .daily, Eliquis 5mg  for 30 day supply  (Eliquis comes in 5mg . only)  Covered?: Yes  Tier: 3 Drug  Prescription Coverage Preferred Pharmacy: [facility] with Person/Company/Phone Number:: [employer] PH# [phone]  Co-Pay: $45.00  Prior Approval: No  Deductible:  (no deductible on this plan)       Shelda Altes Phone Number: 08/07/2020, 12:52 PM

## 2020-08-07 NOTE — Progress Notes (Addendum)
PROGRESS NOTE    Patrick Miranda  WNI:[id number] DOB: [birth year] DOA: 08/03/2020 PCP: Burnard Bunting, MD   Brief Narrative: 75 year old with past medical history significant for CAD status post CABG 06/2020, obstructive sleep apnea, paroxysmal A. fib, insulin-dependent diabetes type 2, hypertension, chronic kidney disease stage IIIa who presents to [facility] complaining of nausea vomiting and abdominal pain.  Patient explains that the morning of admission after eating breakfast fried egg, 20 days old rice and refrigerated already open can of peaches he began experience abdominal pain.  Symptoms accompanied by multiple episodes of vomiting.  Symptoms persisted and eventually patient presented to [facility].  On evaluation patient was found to be tachycardic, tachypneic with a fever of 101.  Mild leukocytosis 11.5.  Assessment & Plan:   Principal Problem:   Abdominal pain Active Problems:   Essential hypertension   AF (paroxysmal atrial fibrillation) (HCC)   Coronary artery disease involving native heart   Chronic kidney disease, stage 3a (HCC)   Mixed diabetic hyperlipidemia associated with type 2 diabetes mellitus ([place])   Uncontrolled type 2 diabetes mellitus with hyperglycemia, with long-term current use of insulin (HCC)   Foodborne gastroenteritis   SIRS (systemic inflammatory response syndrome) (HCC)  1-Abdominal Pain, Nausea, Vomiting:  -? Gastroenteritis ?   Foodborne illness, but wife report patient was not feeling well on Thursday day prior events.  -CT abdomen Pelvis: Aortic atherosclerosis. Small fat containing periumbilical hernia. No acute abnormalities.  -KUB with ileus vs SBO.  -KUB 1/16 distension of Bowel consistent with ileus.  -Spike fever last night, passing gas, mild abdominal pain. CT abdomen showed enteritis.  -plan to advance diet to full liquid. Awaiting for bowel function.    SIRS;  Could be related to gastroenteritis.  Blood  culture: No growth, urine culture: no growth.  Unable to send stool sample, diarrhea resolved.  CT chest negative for PNA, CT abdomen showed enteritis.  Doppler negative for DVT>  Will repeat blood culture if he spike fever.  ECHO shoed LV thrombus.   Doppler negative for DVT.  Day 3 ceftriaxone.   A fib; Continue with metoprolol.   Ventricular Thrombus on ECHO;  Started on Heparin Gtt.  Cardiology has been consulted. Recommend Coumadin.  Plan to start coumadin when bowel function return.   Urine retention;  Started on Flomax.  Voiding trial 1/19.   CAD S/P CABG;  Had CABG 06/2020. Follow up with Dr Nils Pyle.  Informed CVTS of admission.   CKD stage 3 a; Cr range 1.4---1.6 Stable. Stop IV fluids  Diabetes type 2;  Continue with lantus and SSI>   Thrombocytosis;  Continue with aspirin.  Lower than 2 weeks ago, platelet count at that time was 1,004  Mild elevation troponin; denies chest pain.     Estimated body mass index is 23.71 kg/m as calculated from the following:   Height as of this encounter: 5\' 11"  (1.803 m).   Weight as of this encounter: 77.1 kg.   DVT prophylaxis: Lovenox Code Status: Full code Family Communication: wife over the phone 1/18 Disposition Plan:  Status is: inpatient.   The patient remains OBS appropriate and will d/c before 2 midnights.  Dispo: The patient is from: Home              Anticipated d/c is to: Home              Anticipated d/c date is: 2 days  Patient currently is not medically stable to d/c.Awaiting bowel function to return.         Consultants:   Surgery   Procedures:   none  Antimicrobials:    Subjective: Passing gas. Abdominal pain 5/10. Plan to advance diet to full liquid.  Encourage mobilization.    Objective: Vitals:   08/06/20 1652 08/06/20 2013 08/07/20 0441 08/07/20 0944  BP: (!) 102/58 127/67 132/72 137/69  Pulse: 90 99 93 96  Resp: 14 16 16 16   Temp: 99.4 F (37.4 C)  99.4 F (37.4 C) 98.4 F (36.9 C) 99.4 F (37.4 C)  TempSrc: Oral Oral Oral Oral  SpO2: 97% 93% 96% 95%  Weight:      Height:        Intake/Output Summary (Last 24 hours) at 08/07/2020 1508 Last data filed at 08/07/2020 1331 Gross per 24 hour  Intake 2636.03 ml  Output 3875 ml  Net -1238.97 ml   Filed Weights   08/06/20 1115  Weight: 77.1 kg    Examination:  General exam: NAD Respiratory system: CTA Cardiovascular system: CTA Gastrointestinal system: BS present, soft, nt Central nervous system:Alert, following Extremities: Symmetric     Data Reviewed: I have personally reviewed following labs and imaging studies  CBC: Recent Labs  Lab 08/03/20 1756 08/04/20 0237 08/05/20 0331 08/06/20 0304 08/07/20 0249  WBC 11.5* 10.6* 11.4* 9.3 9.7  NEUTROABS 10.2* 8.9*  --   --   --   HGB 10.0* 9.2* 8.9* 8.5* 8.3*  HCT 31.4* 28.7* 28.9* 26.6* 25.9*  MCV 91.8 91.7 92.0 89.9 89.0  PLT 636* 539* 450* 442* [id number]*   Basic Metabolic Panel: Recent Labs  Lab 08/03/20 1756 08/04/20 0237 08/05/20 0331 08/06/20 0304 08/07/20 0249 08/07/20 0912  NA 140 138 136 136  --  133*  K 4.3 4.1 3.6 3.6  --  3.6  CL 105 105 103 103  --  102  CO2 17* 19* 21* 23  --  17*  GLUCOSE 212* 168* 127* 120*  --  209*  BUN 27* 27* 31* 26*  --  19  CREATININE 1.51* 1.58* 1.64* 1.63*  --  1.36*  CALCIUM 8.6* 8.4* 7.5* 7.6*  --  7.5*  MG  --  1.7  --  2.1 2.2  --   PHOS  --  4.6  --   --   --   --    GFR: Estimated Creatinine Clearance: 50 mL/min (A) (by C-G formula based on SCr of 1.36 mg/dL (H)). Liver Function Tests: Recent Labs  Lab 08/03/20 1756 08/04/20 0237  AST 62* 46*  ALT 19 17  ALKPHOS 83 65  BILITOT 1.2 1.0  PROT 6.0* 5.8*  ALBUMIN 3.0* 2.7*   No results for input(s): LIPASE, AMYLASE in the last 168 hours. No results for input(s): AMMONIA in the last 168 hours. Coagulation Profile: Recent Labs  Lab 08/03/20 1756  INR 1.3*   Cardiac Enzymes: No results for input(s):  CKTOTAL, CKMB, CKMBINDEX, TROPONINI in the last 168 hours. BNP (last 3 results) No results for input(s): PROBNP in the last 8760 hours. HbA1C: No results for input(s): HGBA1C in the last 72 hours. CBG: Recent Labs  Lab 08/06/20 1654 08/06/20 2020 08/07/20 0634 08/07/20 0748 08/07/20 1130  GLUCAP 165* 141* 125* 134* 173*   Lipid Profile: No results for input(s): CHOL, HDL, LDLCALC, TRIG, CHOLHDL, LDLDIRECT in the last 72 hours. Thyroid Function Tests: No results for input(s): TSH, T4TOTAL, FREET4, T3FREE, THYROIDAB in the last 72 hours.  Anemia Panel: No results for input(s): VITAMINB12, FOLATE, FERRITIN, TIBC, IRON, RETICCTPCT in the last 72 hours. Sepsis Labs: Recent Labs  Lab 08/03/20 1756 08/04/20 0237  LATICACIDVEN 1.8 1.4    Recent Results (from the past 240 hour(s))  Blood Culture (routine x 2)     Status: None (Preliminary result)   Collection Time: 08/03/20  5:09 PM   Specimen: BLOOD  Result Value Ref Range Status   Specimen Description BLOOD SITE NOT SPECIFIED  Final   Special Requests   Final    BOTTLES DRAWN AEROBIC AND ANAEROBIC Blood Culture adequate volume   Culture   Final    NO GROWTH 4 DAYS Performed at [facility], [address], [place], [place] [address]    Report Status PENDING  Incomplete  Blood Culture (routine x 2)     Status: None (Preliminary result)   Collection Time: 08/03/20  5:57 PM   Specimen: BLOOD  Result Value Ref Range Status   Specimen Description BLOOD SITE NOT SPECIFIED  Final   Special Requests   Final    BOTTLES DRAWN AEROBIC AND ANAEROBIC Blood Culture adequate volume   Culture   Final    NO GROWTH 4 DAYS Performed at [facility], [address] [place], [place] [address]    Report Status PENDING  Incomplete  Urine culture     Status: Abnormal   Collection Time: 08/03/20  7:14 PM   Specimen: In/Out Cath Urine  Result Value Ref Range Status   Specimen Description IN/OUT CATH URINE  Final   Special  Requests   Final    NONE Performed at [facility], [place] [address] [place], [place] [address]    Culture MULTIPLE SPECIES PRESENT, SUGGEST RECOLLECTION (A)  Final   Report Status 08/05/2020 FINAL  Final  Resp Panel by RT-PCR (Flu A&B, Covid)     Status: None   Collection Time: 08/03/20  7:14 PM   Specimen: Nasopharyngeal(NP) swabs in vial transport medium  Result Value Ref Range Status   SARS Coronavirus 2 by RT PCR NEGATIVE NEGATIVE Final    Comment: (NOTE) SARS-CoV-2 target nucleic acids are NOT DETECTED.  The SARS-CoV-2 RNA is generally detectable in upper respiratory specimens during the acute phase of infection. The lowest concentration of SARS-CoV-2 viral copies this assay can detect is 138 copies/mL. A negative result does not preclude SARS-Cov-2 infection and should not be used as the sole basis for treatment or other patient management decisions. A negative result may occur with  improper specimen collection/handling, submission of specimen other than nasopharyngeal swab, presence of viral mutation(s) within the areas targeted by this assay, and inadequate number of viral copies(<138 copies/mL). A negative result must be combined with clinical observations, patient history, and epidemiological information. The expected result is Negative.  Fact Sheet for Patients:  EntrepreneurPulse.com.au  Fact Sheet for Healthcare Providers:  IncredibleEmployment.be  This test is no t yet approved or cleared by the [place] FDA and  has been authorized for detection and/or diagnosis of SARS-CoV-2 by FDA under an Emergency Use Authorization (EUA). This EUA will remain  in effect (meaning this test can be used) for the duration of the COVID-19 declaration under Section 564(b)(1) of the Act, 21 U.S.C.section 360bbb-3(b)(1), unless the authorization is terminated  or revoked sooner.       Influenza A by PCR NEGATIVE NEGATIVE Final    Influenza B by PCR NEGATIVE NEGATIVE Final    Comment: (NOTE) The Xpert  Xpress SARS-CoV-2/FLU/RSV plus assay is intended as an aid in the diagnosis of influenza from Nasopharyngeal swab specimens and should not be used as a sole basis for treatment. Nasal washings and aspirates are unacceptable for Xpert Xpress SARS-CoV-2/FLU/RSV testing.  Fact Sheet for Patients: EntrepreneurPulse.com.au  Fact Sheet for Healthcare Providers: IncredibleEmployment.be  This test is not yet approved or cleared by the [place] FDA and has been authorized for detection and/or diagnosis of SARS-CoV-2 by FDA under an Emergency Use Authorization (EUA). This EUA will remain in effect (meaning this test can be used) for the duration of the COVID-19 declaration under Section 564(b)(1) of the Act, 21 U.S.C. section 360bbb-3(b)(1), unless the authorization is terminated or revoked.  Performed at [facility], [place] [address] [place], [place] [address]   Urine Culture     Status: None   Collection Time: 08/05/20  3:30 PM   Specimen: Urine, Random  Result Value Ref Range Status   Specimen Description URINE, RANDOM  Final   Special Requests NONE  Final   Culture   Final    NO GROWTH Performed at [facility], [place] [address] [place], [place] [address]    Report Status 08/06/2020 FINAL  Final         Radiology Studies: ECHOCARDIOGRAM COMPLETE  Result Date: 08/06/2020    ECHOCARDIOGRAM REPORT   Patient Name:   Patrick Miranda Date of Exam: 08/06/2020 Medical Rec #:  [id number]          Height:       71.0 in Accession #:    [id number]         Weight:       170.0 lb Date of Birth:  29-May-1946          BSA:          1.968 m Patient Age:    67 years           BP:           126/65 mmHg Patient Gender: M                  HR:           90 bpm. Exam Location:  Inpatient Procedure: 2D Echo, 3D Echo, Color Doppler and Cardiac Doppler Indications:    Fever   History:        Patient has prior history of Echocardiogram examinations, most                 recent 07/09/2020. Prior CABG, Arrythmias:Atrial Fibrillation;                 Risk Factors:Hypertension, Diabetes, Dyslipidemia and Sleep                 Apnea.  Sonographer:    Raquel Sarna Senior RDCS Referring Phys: [phone] Daymian Lill A Latese Dufault IMPRESSIONS  1. Probable LV apical thrombus. 1.5 x 1.5 cm, with heterogeneous echo texture. Centrally more echo lucent.  2. Left ventricular ejection fraction, by estimation, is 45%. The left ventricle has mildly decreased function. The left ventricle demonstrates regional wall motion abnormalities, large basal inferior and inferoseptal aneurysm, which appears unchanged compared to intraoperative TEE. Left ventricular diastolic parameters are indeterminate.  3. Right ventricular systolic function is moderately reduced. The right ventricular size is normal. There is normal pulmonary artery systolic pressure. The estimated right ventricular systolic pressure is 79.0 mmHg.  4. The mitral valve is grossly normal. Mild mitral valve regurgitation.  5. The aortic valve is grossly normal. Aortic valve regurgitation is mild. No aortic stenosis is present.  6. Aortic dilatation noted. There is borderline dilatation of the ascending aorta, measuring 38 mm.  7. The inferior vena cava is normal in size with greater than 50% respiratory variability, suggesting right atrial pressure of 3 mmHg. Conclusion(s)/Recommendation(s): Critical findings reported to Dr. Zorita Pang and acknowledged at 4:26 PM 08/06/20. FINDINGS  Left Ventricle: Left ventricular ejection fraction, by estimation, is 45%. The left ventricle has mildly decreased function. The left ventricle demonstrates regional wall motion abnormalities. The left ventricular internal cavity size was normal in size. There is no left ventricular hypertrophy. Left ventricular diastolic parameters are indeterminate.  LV Wall Scoring: The basal inferolateral  segment is aneurysmal. The mid inferolateral segment, apical septal segment, and apical inferior segment are akinetic. The apical lateral segment and apical anterior segment are hypokinetic. Right Ventricle: The right ventricular size is normal. No increase in right ventricular wall thickness. Right ventricular systolic function is moderately reduced. There is normal pulmonary artery systolic pressure. The tricuspid regurgitant velocity is 2.36 m/s, and with an assumed right atrial pressure of 3 mmHg, the estimated right ventricular systolic pressure is [id number] mmHg. Left Atrium: Left atrial size was normal in size. Right Atrium: Right atrial size was normal in size. Pericardium: Trivial pericardial effusion is present. Mitral Valve: The mitral valve is grossly normal. Mild mitral valve regurgitation. Tricuspid Valve: The tricuspid valve is normal in structure. Tricuspid valve regurgitation is trivial. Aortic Valve: The aortic valve is grossly normal. Aortic valve regurgitation is mild. Aortic regurgitation PHT measures 412 msec. No aortic stenosis is present. Pulmonic Valve: The pulmonic valve was grossly normal. Pulmonic valve regurgitation is trivial. Aorta: Aortic dilatation noted. There is borderline dilatation of the ascending aorta, measuring 38 mm. Venous: The inferior vena cava is normal in size with greater than 50% respiratory variability, suggesting right atrial pressure of 3 mmHg. IAS/Shunts: No atrial level shunt detected by color flow Doppler.  LEFT VENTRICLE PLAX 2D LVIDd:         4.90 cm  Diastology LVIDs:         3.90 cm  LV e' medial:    5.11 cm/s LV PW:         1.20 cm  LV E/e' medial:  15.8 LV IVS:        1.00 cm  LV e' lateral:   10.40 cm/s LVOT diam:     2.00 cm  LV E/e' lateral: 7.8 LV SV:         68 LV SV Index:   35 LVOT Area:     3.14 cm  RIGHT VENTRICLE RV S prime:     6.96 cm/s TAPSE (M-mode): 1.0 cm LEFT ATRIUM             Index       RIGHT ATRIUM           Index LA diam:        4.00 cm  2.03 cm/m  RA Area:     20.80 cm LA Vol (A2C):   39.3 ml 19.97 ml/m RA Volume:   53.60 ml  27.24 ml/m LA Vol (A4C):   63.4 ml 32.22 ml/m LA Biplane Vol: 50.8 ml 25.82 ml/m  AORTIC VALVE LVOT Vmax:   126.00 cm/s LVOT Vmean:  97.600 cm/s LVOT VTI:    0.218 m AI PHT:      412 msec  AORTA Ao Root diam: 3.50 cm Ao Asc  diam:  3.80 cm MITRAL VALVE               TRICUSPID VALVE MV Area (PHT): 3.39 cm    TR Peak grad:   22.3 mmHg MV Decel Time: 224 msec    TR Vmax:        236.00 cm/s MV E velocity: 80.60 cm/s MV A velocity: 85.30 cm/s  SHUNTS MV E/A ratio:  0.94        Systemic VTI:  0.22 m                            Systemic Diam: 2.00 cm Cherlynn Kaiser MD Electronically signed by Cherlynn Kaiser MD Signature Date/Time: 08/06/2020/4:34:29 PM    Final    VAS [place] LOWER EXTREMITY VENOUS (DVT)  Result Date: 08/06/2020  Lower Venous DVT Study Indications: Edema.  Comparison Study: No prior venous exams Performing Technologist: Rogelia Rohrer  Examination Guidelines: A complete evaluation includes B-mode imaging, spectral Doppler, color Doppler, and power Doppler as needed of all accessible portions of each vessel. Bilateral testing is considered an integral part of a complete examination. Limited examinations for reoccurring indications may be performed as noted. The reflux portion of the exam is performed with the patient in reverse Trendelenburg.  +---------+---------------+---------+-----------+----------+--------------+ RIGHT    CompressibilityPhasicitySpontaneityPropertiesThrombus Aging +---------+---------------+---------+-----------+----------+--------------+ CFV      Full           Yes      Yes                                 +---------+---------------+---------+-----------+----------+--------------+ SFJ      Full                                                        +---------+---------------+---------+-----------+----------+--------------+ FV Prox  Full           Yes      Yes                                  +---------+---------------+---------+-----------+----------+--------------+ FV Mid   Full           Yes      Yes                                 +---------+---------------+---------+-----------+----------+--------------+ FV DistalFull           Yes      Yes                                 +---------+---------------+---------+-----------+----------+--------------+ PFV      Full                                                        +---------+---------------+---------+-----------+----------+--------------+ POP      Full           Yes  Yes                                 +---------+---------------+---------+-----------+----------+--------------+ PTV      Full                                                        +---------+---------------+---------+-----------+----------+--------------+ PERO     Full                                                        +---------+---------------+---------+-----------+----------+--------------+   +---------+---------------+---------+-----------+----------+--------------+ LEFT     CompressibilityPhasicitySpontaneityPropertiesThrombus Aging +---------+---------------+---------+-----------+----------+--------------+ CFV      Full           Yes      Yes                                 +---------+---------------+---------+-----------+----------+--------------+ SFJ      Full                                                        +---------+---------------+---------+-----------+----------+--------------+ FV Prox  Full           Yes      Yes                                 +---------+---------------+---------+-----------+----------+--------------+ FV Mid   Full           Yes      Yes                                 +---------+---------------+---------+-----------+----------+--------------+ FV DistalFull           Yes      Yes                                  +---------+---------------+---------+-----------+----------+--------------+ PFV      Full                                                        +---------+---------------+---------+-----------+----------+--------------+ POP      Full           Yes      Yes                                 +---------+---------------+---------+-----------+----------+--------------+ PTV      Full                                                        +---------+---------------+---------+-----------+----------+--------------+  PERO     Full                                                        +---------+---------------+---------+-----------+----------+--------------+     Summary: BILATERAL: - No evidence of deep vein thrombosis seen in the lower extremities, bilaterally. - RIGHT: - No cystic structure found in the popliteal fossa.  LEFT: - No cystic structure found in the popliteal fossa.  *See table(s) above for measurements and observations. Electronically signed by Ruta Hinds MD on 08/06/2020 at 5:17:07 PM.    Final    CT CHEST ABDOMEN PELVIS WO CONTRAST  Result Date: 08/06/2020 CLINICAL DATA:  Abdominal pain. Nausea and vomiting. Fever of unknown origin EXAM: CT CHEST, ABDOMEN AND PELVIS WITHOUT CONTRAST TECHNIQUE: Multidetector CT imaging of the chest, abdomen and pelvis was performed following the standard protocol without IV contrast. COMPARISON:  AP CT on 08/03/2020, and chest CT on 07/06/2020 FINDINGS: CT CHEST FINDINGS Cardiovascular: No acute findings. Stable mild pericardial thickening. Aortic and coronary atherosclerotic calcification noted. Prior CABG. Mediastinum/Lymph Nodes: No masses or pathologically enlarged lymph nodes identified on this unenhanced exam. Lungs/Pleura: Tiny bilateral pleural effusions and dependent bibasilar atelectasis are new since previous study. No evidence of pulmonary consolidation or mass. Central tracheobronchial airways are patent. Musculoskeletal:  No  suspicious bone lesions identified. CT ABDOMEN AND PELVIS FINDINGS Hepatobiliary: No masses visualized on this unenhanced exam. High attenuation sludge and possible tiny calculi noted within the gallbladder, however there is no evidence of cholecystitis or biliary ductal dilatation. Pancreas: No mass or inflammatory changes identified on this unenhanced exam. Spleen:  Within normal limits in size. Adrenals/Urinary Tract: A tiny 1-2 mm calculus is seen in the midpole the left kidney. No evidence of ureteral calculi or hydronephrosis. Bilateral renal sinus cysts are again noted. A Foley catheter is seen within the bladder which is collapsed. Stomach/Bowel: Moderate bowel wall thickening is seen involving multiple distal small bowel loops. Soft tissue stranding is seen throughout the small bowel mesentery, with tiny amount of ascites in Morison's pouch and pelvic cul-de-sac. No evidence of bowel obstruction or abscess. Vascular/Lymphatic: No pathologically enlarged lymph nodes identified. No abdominal aortic aneurysm. Aortic atherosclerotic calcification noted. Reproductive:  No masses or other significant abnormality. Other: Stable tiny paraumbilical ventral hernia, which contains only fat. No evidence of herniated bowel loops. Musculoskeletal:  No suspicious bone lesions identified. IMPRESSION: Moderate thickening involving multiple distal small bowel loops, consistent with enteritis. Associated mesenteric inflammatory changes and tiny amount of free fluid. No evidence of abscess. Tiny bilateral pleural effusions and dependent bibasilar atelectasis. Tiny nonobstructing left renal calculus. No evidence of ureteral calculi or hydronephrosis. Cholelithiasis and possible gallbladder sludge. No radiographic evidence of cholecystitis. Stable tiny paraumbilical ventral hernia, which contains only fat. Aortic Atherosclerosis (ICD10-I70.0). Electronically Signed   By: Marlaine Hind M.D.   On: 08/06/2020 14:11         Scheduled Meds: . aspirin  81 mg Oral Daily  . Chlorhexidine Gluconate Cloth  6 each Topical Daily  . fenofibrate  160 mg Oral Daily  . insulin aspart  0-15 Units Subcutaneous TID AC & HS  . insulin glargine  15 Units Subcutaneous Daily  . [START ON 08/08/2020] metoprolol succinate  75 mg Oral Daily  . rosuvastatin  20 mg Oral Daily  . tamsulosin  0.4  mg Oral Daily   Continuous Infusions: . sodium chloride 45 mL/hr at 08/07/20 1331  . cefTRIAXone (ROCEPHIN)  IV Stopped (08/07/20 1040)  . heparin 1,550 Units/hr (08/07/20 1331)     LOS: 3 days    Time spent: 35 minutes.     Elmarie Shiley, MD [facility]   If 7PM-7AM, please contact night-coverage [email or web address]  08/07/2020, 3:08 PM

## 2020-08-07 NOTE — Progress Notes (Signed)
Cardiology Office Note    Date:  08/14/2020   ID:  Patrick Miranda, DOB [birth year], MRN [id number]  PCP:  Burnard Bunting, MD  Cardiologist: Rozann Lesches, MD EPS: None  Chief Complaint  Patient presents with  . Follow-up  . Hospitalization Follow-up    History of Present Illness:  Patrick Miranda is a 75 y.o. male  with a history of CAD s/p CABG x3 in 06/2020, chronic heart failure with mildly reduced EF, paroxysmal atrial fibrillation in the setting of COVID and post CABG, PE related to COVID infection in 11/2019, hypertension, hyperlipidemia, type 2 diabetes, obstructive sleep apnea on CPAP, and depression   Patient was seen in the hospital 08/06/2020 and incidentally found to have an LV thrombus on echo LVEF 45% with large basal inferior and inferoseptal aneurysm and probable LV apical thrombus measuring 1.5 x 1.5 cm.  Likely secondary to aneurysm and remodeling.  He was treated with IV heparin and transition to Coumadin.  Patient echo initially was done because of abdominal pain and high fevers. Possible gastroenteritis versus mesenteric ischemia from LV thrombus.  Patient comes in with his wife. Still having abdominal discomfort when he eats anything and causes diarrhea. Trouble buttoning pants-thinks it's swelling. Not eating much. No cardiac complaints. No chest pain, palpitations, dyspnea   Past Medical History:  Diagnosis Date  . Arthritis   . 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 Cancer (HCC)    Skin  . Cerebral artery disease   . Coronary artery disease   . COVID-07 Dec 2019  . COVID-19 virus infection 12/02/2019  . Depression   . Essential hypertension   . Hernia, umbilical   . HOH (hard of hearing)   . Hyperlipidemia   . NSTEMI (non-ST elevated myocardial infarction) ([place])   . OSA on CPAP   . Pulmonary emboli [facility])    May 2021  . Type 2 diabetes mellitus ([place])     Past Surgical History:  Procedure Laterality Date  . COLONOSCOPY W/ POLYPECTOMY     . CORONARY ARTERY BYPASS GRAFT N/A 07/09/2020   Procedure: CORONARY ARTERY BYPASS GRAFTING (CABG) TIMES THREE , USING LEFT INTERNAL MAMMARY ARTERY AND RIGHT LEG GREATER SAPHENOUS VEIN HARVESTED ENDOSCOPICALLY;  Surgeon: Ivin Poot, MD;  Location: [facility];  Service: Open Heart Surgery;  Laterality: N/A;  . LEFT HEART CATH AND CORONARY ANGIOGRAPHY N/A 07/04/2020   Procedure: LEFT HEART CATH AND CORONARY ANGIOGRAPHY;  Surgeon: Troy Sine, MD;  Location: [facility];  Service: Cardiovascular;  Laterality: N/A;  . TEE WITHOUT CARDIOVERSION N/A 07/09/2020   Procedure: TRANSESOPHAGEAL ECHOCARDIOGRAM (TEE);  Surgeon: Prescott Gum, Collier Salina, MD;  Location: [facility];  Service: Open Heart Surgery;  Laterality: N/A;  . TOTAL KNEE ARTHROPLASTY Left 04/22/2013   Procedure: TOTAL KNEE ARTHROPLASTY- left;  Surgeon: Augustin Schooling, MD;  Location: [place];  Service: Orthopedics;  Laterality: Left;  with femoral block  . TOTAL KNEE ARTHROPLASTY Right 01/06/2014   Procedure: RIGHT TOTAL KNEE ARTHROPLASTY;  Surgeon: Augustin Schooling, MD;  Location: [place];  Service: Orthopedics;  Laterality: Right;    Current Medications: Current Meds  Medication Sig  . acetaminophen (TYLENOL) 325 MG tablet Take 2 tablets (650 mg total) by mouth every 6 (six) hours as needed for mild pain or headache (or Fever >/= 101).  Marland Kitchen aspirin EC 325 MG EC tablet Take 1 tablet (325 mg total) by mouth daily.  . BD PEN NEEDLE NANO U/F 32G X  4 MM MISC 1 each by Other route daily.   . Empagliflozin-metFORMIN HCl 11-998 MG TABS Take 1 tablet by mouth 2 (two) times daily.  . fenofibrate 160 MG tablet Take 160 mg by mouth daily.  Marland Kitchen HUMALOG KWIKPEN 100 UNIT/ML KwikPen Inject 10 Units into the skin with breakfast, with lunch, and with evening meal.   . metoprolol tartrate (LOPRESSOR) 25 MG tablet Take 1 tablet (25 mg total) by mouth 2 (two) times daily.  Glory Rosebush VERIO test strip 1 each by Other route in the morning, at noon, and at bedtime.   .  rosuvastatin (CRESTOR) 20 MG tablet Take 1 tablet (20 mg total) by mouth daily.  . tamsulosin (FLOMAX) 0.4 MG CAPS capsule Take 1 capsule (0.4 mg total) by mouth daily.  [name] 300 UNIT/ML Solostar Pen Inject 20 Units into the skin daily.  . traMADol (ULTRAM) 50 MG tablet Take 1 tablet (50 mg total) by mouth every 6 (six) hours as needed for moderate pain.  Marland Kitchen warfarin (COUMADIN) 5 MG tablet Take 7.5mg (1.5 tabs)  nightly on Friday and Saturday and 1 tab on Sunday and then based on INR check on Monday  . [DISCONTINUED] enoxaparin (LOVENOX) 80 MG/0.8ML injection Inject 0.8 mLs (80 mg total) into the skin every 12 (twelve) hours for 4 days. For 3-4days, stop when INR>2  . [DISCONTINUED] ferrous [id number] C-folic acid (TRINSICON / FOLTRIN) capsule Take 1 capsule by mouth 2 (two) times daily after a meal.     Allergies:   Lipitor [atorvastatin]   Social History   Socioeconomic History  . Marital status: Married    Spouse name: Not on file  . Number of children: Not on file  . Years of education: Not on file  . Highest education level: Not on file  Occupational History  . Not on file  Tobacco Use  . Smoking status: Never Smoker  . Smokeless tobacco: Never Used  Vaping Use  . Vaping Use: Never used  Substance and Sexual Activity  . Alcohol use: No  . Drug use: No  . Sexual activity: Not on file  Other Topics Concern  . Not on file  Social History Narrative  . Not on file   Social Determinants of Health   Financial Resource Strain: Not on file  Food Insecurity: No Food Insecurity  . Worried About Charity fundraiser in the Last Year: Never true  . Ran Out of Food in the Last Year: Never true  Transportation Needs: No Transportation Needs  . Lack of Transportation (Medical): No  . Lack of Transportation (Non-Medical): No  Physical Activity: Not on file  Stress: Not on file  Social Connections: Not on file     Family History:  The patient's family history  includes CAD in his brother.   ROS:   Please see the history of present illness.    ROS All other systems reviewed and are negative.   PHYSICAL EXAM:   VS:  BP 132/60   Pulse 97   Ht 5' 10.5" (1.791 m)   Wt 169 lb (76.7 kg)   SpO2 98%   BMI 23.91 kg/m   Physical Exam  GEN: Thin, in no acute distress  Neck: no JVD, carotid bruits, or masses Cardiac:RRR; no murmurs, rubs, or gallops  Respiratory:  clear to auscultation bilaterally, normal work of breathing GI: soft, nontender, nondistended, + BS Ext: trace right ankle edema,without cyanosis, clubbing, Good distal pulses bilaterally Neuro:  Alert and Oriented x 3,  Psych: euthymic mood, full affect  Wt Readings from Last 3 Encounters:  08/14/20 169 lb (76.7 kg)  08/09/20 178 lb 10 oz (81 kg)  08/01/20 170 lb (77.1 kg)      Studies/Labs Reviewed:   EKG:  EKG is not ordered today.    Recent Labs: 07/05/2020: TSH 2.525 08/04/2020: ALT 17; B Natriuretic Peptide 656.9 08/09/2020: BUN 14; Creatinine, Ser 1.08; Hemoglobin 9.2; Platelets 309; Potassium 3.8; Sodium 136 08/10/2020: Magnesium 1.9   Lipid Panel    Component Value Date/Time   CHOL 122 07/06/2020 0303   TRIG 133 07/06/2020 0303   HDL 27 (L) 07/06/2020 0303   CHOLHDL 4.5 07/06/2020 0303   VLDL 27 07/06/2020 0303   LDLCALC 68 07/06/2020 0303    Additional studies/ records that were reviewed today include:   Echo 08/06/20 IMPRESSIONS     1. Probable LV apical thrombus. 1.5 x 1.5 cm, with heterogeneous echo  texture. Centrally more echo lucent.   2. Left ventricular ejection fraction, by estimation, is 45%. The left  ventricle has mildly decreased function. The left ventricle demonstrates  regional wall motion abnormalities, large basal inferior and inferoseptal  aneurysm, which appears unchanged  compared to intraoperative TEE. Left ventricular diastolic parameters are  indeterminate.   3. Right ventricular systolic function is moderately reduced. The right   ventricular size is normal. There is normal pulmonary artery systolic  pressure. The estimated right ventricular systolic pressure is 99991111 mmHg.   4. The mitral valve is grossly normal. Mild mitral valve regurgitation.   5. The aortic valve is grossly normal. Aortic valve regurgitation is  mild. No aortic stenosis is present.   6. Aortic dilatation noted. There is borderline dilatation of the  ascending aorta, measuring 38 mm.   7. The inferior vena cava is normal in size with greater than 50%  respiratory variability, suggesting right atrial pressure of 3 mmHg.   Conclusion(s)/Recommendation(s): Critical findings reported to Dr.  Zorita Pang and acknowledged at 4:26 PM 08/06/20.     Left Cardiac Catheterization 07/04/2020:  Ost LAD to Prox LAD lesion is 40% stenosed.  2nd Diag lesion is 90% stenosed.  1st Diag-1 lesion is 95% stenosed.  1st Diag-2 lesion is 90% stenosed with 90% stenosed side branch in Lat 1st Diag.  Mid LAD-1 lesion is 90% stenosed.  Mid LAD-2 lesion is 70% stenosed.  Lat 2nd Mrg lesion is 95% stenosed.  1st Mrg lesion is 20% stenosed.  Prox RCA lesion is 95% stenosed.  Dist LAD lesion is 50% stenosed.   Severe coronary obstructive disease with significant calcification involving the LAD.  The LAD is diffusely diseased extending from the proximal to mid segment with 40% diffuse proximal stenosis, calcified 90% stenosis on a bend in the vessel in the region of the first and second diagonal vessels with 95% stenoses in the ostium of the first diagonal and 90% in the second diagonal, 70% mid stenosis and 50% mid distal stenoses.   The circumflex vessel has 20% smooth narrowing in the first marginal branch with 95% focal stenosis in a branch of the second marginal vessel.   The RCA has a focal 95% mid stenosis with reduced antegrade distal flow secondary to competitive left-to-right collateralization.   Normal LV function with EF estimated at 55 mm; LVEDP 12  mm Hg.   Recommendation: Surgical consultation for CABG revascularization.  Patient will need to hold Plavix and will need Plavix washout. _______________   Echocardiogram 08/07/2019: Impressions: 1. Probable LV apical thrombus. 1.5  x 1.5 cm, with heterogeneous echo  texture. Centrally more echo lucent.   2. Left ventricular ejection fraction, by estimation, is 45%. The left  ventricle has mildly decreased function. The left ventricle demonstrates  regional wall motion abnormalities, large basal inferior and inferoseptal  aneurysm, which appears unchanged  compared to intraoperative TEE. Left ventricular diastolic parameters are  indeterminate.   3. Right ventricular systolic function is moderately reduced. The right  ventricular size is normal. There is normal pulmonary artery systolic  pressure. The estimated right ventricular systolic pressure is 06.2 mmHg.   4. The mitral valve is grossly normal. Mild mitral valve regurgitation.   5. The aortic valve is grossly normal. Aortic valve regurgitation is  mild. No aortic stenosis is present.   6. Aortic dilatation noted. There is borderline dilatation of the  ascending aorta, measuring 38 mm.   7. The inferior vena cava is normal in size with greater than 50%  respiratory variability, suggesting right atrial pressure of 3 mmHg.   Conclusion(s)/Recommendation(s): Critical findings reported to Dr.  Zorita Pang and acknowledged at 4:26 PM 08/06/20.     Risk Assessment/Calculations:    CHA2DS2-VASc Score = 7  This indicates a 11.2% annual risk of stroke. The patient's score is based upon: CHF History: Yes HTN History: Yes Diabetes History: No Stroke History: Yes Vascular Disease History: Yes Age Score: 2 Gender Score: 0        ASSESSMENT:    1. Abdominal pain, unspecified abdominal location   2. Thrombus in heart chamber   3. Paroxysmal atrial fibrillation (HCC)   4. Coronary artery disease involving coronary bypass  graft of native heart without angina pectoris   5. Ischemic cardiomyopathy      PLAN:  In order of problems listed above:  New LV Thrombus:- likely secondary to aneurysm & remodeling now on coumadin   Paroxsymal Atrial Fibrillation, Prior PE, Prior Stroke, DM, HTN, CAD as below, HFmrEF as below- CHADSVASC=8.- Rhythm control options: malaise, weakness,nausea and vomiting on amiodarone; will attempt rate control if possible. HR 90's today but regular. Just took toprol before coming in. Won't increase today. Needs close outpatient f/u   Ischemic cardiomyopathy compensated. Can't add meds until GI problems resolve   CKD IIIa  Coronary Artery Disease;S/P CABG 06/2020-no angina  HLD on statin  Abdominal pain-ongoing GI symptoms with no clear diagnosis. Will refer to GI to follow closely.  History of PE post covid 11/2019        Shared Decision Making/Informed Consent        Medication Adjustments/Labs and Tests Ordered: Current medicines are reviewed at length with the patient today.  Concerns regarding medicines are outlined above.  Medication changes, Labs and Tests ordered today are listed in the Patient Instructions below. Patient Instructions  Medication Instructions:  Your physician recommends that you continue on your current medications as directed. Please refer to the Current Medication list given to you today.  *If you need a refill on your cardiac medications before your next appointment, please call your pharmacy*   Lab Work: NONE   If you have labs (blood work) drawn today and your tests are completely normal, you will receive your results only by: Marland Kitchen MyChart Message (if you have MyChart) OR . A paper copy in the mail If you have any lab test that is abnormal or we need to change your treatment, we will call you to review the results.   Testing/Procedures: NONE    Follow-Up: At [facility], you and  your health needs are our priority.  As part of our  continuing mission to provide you with exceptional heart care, we have created designated Provider Care Teams.  These Care Teams include your primary Cardiologist (physician) and Advanced Practice Providers (APPs -  Physician Assistants and Nurse Practitioners) who all work together to provide you with the care you need, when you need it.  We recommend signing up for the patient portal called "MyChart".  Sign up information is provided on this After Visit Summary.  MyChart is used to connect with patients for Virtual Visits (Telemedicine).  Patients are able to view lab/test results, encounter notes, upcoming appointments, etc.  Non-urgent messages can be sent to your provider as well.   To learn more about what you can do with MyChart, go to [email or web address].    Your next appointment:   4-6 week(s)  The format for your next appointment:   In Person  Provider:   Rozann Lesches, MD   Other Instructions Thank you for choosing [facility]!       Sumner Boast, PA-C  08/14/2020 10:48 AM    [facility] [address], [place], [place]  [address] Phone: [phone]; Fax: [phone]

## 2020-08-07 NOTE — Progress Notes (Signed)
Progress Note     Subjective: Patient reports abdominal pain is still present but continues to improve. Nausea better. Passing flatus but no BM yet. Ambulation helped with abdominal symptoms yesterday.   Objective: Vital signs in last 24 hours: Temp:  [98.4 F (36.9 C)-100 F (37.8 C)] 98.4 F (36.9 C) (01/18 0441) Pulse Rate:  [90-99] 93 (01/18 0441) Resp:  [14-16] 16 (01/18 0441) BP: (102-132)/(58-72) 132/72 (01/18 0441) SpO2:  [93 %-97 %] 96 % (01/18 0441) Weight:  [77.1 kg] 77.1 kg (01/17 1115) Last BM Date: 08/02/20  Intake/Output from previous day: 01/17 0701 - 01/18 0700 In: 2585.4 [P.O.:820; I.V.:1665.4; IV Piggyback:100] Out: 2725 [Urine:2725] Intake/Output this shift: No intake/output data recorded.  PE: General: pleasant, WD, WN male who is laying in bed in NAD HEENT: head is normocephalic, atraumatic.  Sclera are anicteric Heart: regular, rate, and rhythm.   Lungs: CTAB, no wheezes, rhonchi, or rales noted.  Respiratory effort nonlabored Abd: soft, diffusely ttp, moderately distended, BS hypoactive, small umbilical hernia without overlying erythema  MS: all 4 extremities are symmetrical with no cyanosis, clubbing, or edema. Skin: warm and dry with no masses, lesions, or rashes Neuro: Cranial nerves 2-12 grossly intact, sensation is normal throughout Psych: A&Ox3 with an appropriate affect.   Lab Results:  Recent Labs    08/06/20 0304 08/07/20 0249  WBC 9.3 9.7  HGB 8.5* 8.3*  HCT 26.6* 25.9*  PLT 442* 425*   BMET Recent Labs    08/05/20 0331 08/06/20 0304  NA 136 136  K 3.6 3.6  CL 103 103  CO2 21* 23  GLUCOSE 127* 120*  BUN 31* 26*  CREATININE 1.64* 1.63*  CALCIUM 7.5* 7.6*   PT/INR No results for input(s): LABPROT, INR in the last 72 hours. CMP     Component Value Date/Time   NA 136 08/06/2020 0304   K 3.6 08/06/2020 0304   CL 103 08/06/2020 0304   CO2 23 08/06/2020 0304   GLUCOSE 120 (H) 08/06/2020 0304   BUN 26 (H) 08/06/2020  0304   CREATININE 1.63 (H) 08/06/2020 0304   CALCIUM 7.6 (L) 08/06/2020 0304   PROT 5.8 (L) 08/04/2020 0237   ALBUMIN 2.7 (L) 08/04/2020 0237   AST 46 (H) 08/04/2020 0237   ALT 17 08/04/2020 0237   ALKPHOS 65 08/04/2020 0237   BILITOT 1.0 08/04/2020 0237   GFRNONAA 44 (L) 08/06/2020 0304   GFRAA >60 03/12/2020 0959   Lipase  No results found for: LIPASE     Studies/Results: DG Abd 1 View  Result Date: 08/05/2020 CLINICAL DATA:  Insert epic study no EXAM: ABDOMEN - 1 VIEW COMPARISON:  Abdominal radiograph 08/04/2020 FINDINGS: Persistent gaseous distension of both small and large bowel loops, similar to prior, most likely representing ileus. No pneumatosis or supine evidence of free air. No unexpected radiopaque calcification. No acute finding in the visualized skeleton. IMPRESSION: Persistent gaseous distension of both small and large bowel loops, similar to prior, most likely representing ileus. Electronically Signed   By: Audie Pinto M.D.   On: 08/05/2020 10:07   ECHOCARDIOGRAM COMPLETE  Result Date: 08/06/2020    ECHOCARDIOGRAM REPORT   Patient Name:   Patrick Miranda Date of Exam: 08/06/2020 Medical Rec #:  [id number]          Height:       71.0 in Accession #:    [id number]         Weight:       170.0 lb Date of  Birth:  [birth year]          BSA:          1.968 m Patient Age:    75 years           BP:           126/65 mmHg Patient Gender: M                  HR:           90 bpm. Exam Location:  Inpatient Procedure: 2D Echo, 3D Echo, Color Doppler and Cardiac Doppler Indications:    Fever  History:        Patient has prior history of Echocardiogram examinations, most                 recent 07/09/2020. Prior CABG, Arrythmias:Atrial Fibrillation;                 Risk Factors:Hypertension, Diabetes, Dyslipidemia and Sleep                 Apnea.  Sonographer:    Raquel Sarna Senior RDCS Referring Phys: [phone] BELKYS A REGALADO IMPRESSIONS  1. Probable LV apical thrombus. 1.5 x 1.5 cm, with  heterogeneous echo texture. Centrally more echo lucent.  2. Left ventricular ejection fraction, by estimation, is 45%. The left ventricle has mildly decreased function. The left ventricle demonstrates regional wall motion abnormalities, large basal inferior and inferoseptal aneurysm, which appears unchanged compared to intraoperative TEE. Left ventricular diastolic parameters are indeterminate.  3. Right ventricular systolic function is moderately reduced. The right ventricular size is normal. There is normal pulmonary artery systolic pressure. The estimated right ventricular systolic pressure is [id number] mmHg.  4. The mitral valve is grossly normal. Mild mitral valve regurgitation.  5. The aortic valve is grossly normal. Aortic valve regurgitation is mild. No aortic stenosis is present.  6. Aortic dilatation noted. There is borderline dilatation of the ascending aorta, measuring 38 mm.  7. The inferior vena cava is normal in size with greater than 50% respiratory variability, suggesting right atrial pressure of 3 mmHg. Conclusion(s)/Recommendation(s): Critical findings reported to Dr. Zorita Pang and acknowledged at 4:26 PM 08/06/20. FINDINGS  Left Ventricle: Left ventricular ejection fraction, by estimation, is 45%. The left ventricle has mildly decreased function. The left ventricle demonstrates regional wall motion abnormalities. The left ventricular internal cavity size was normal in size. There is no left ventricular hypertrophy. Left ventricular diastolic parameters are indeterminate.  LV Wall Scoring: The basal inferolateral segment is aneurysmal. The mid inferolateral segment, apical septal segment, and apical inferior segment are akinetic. The apical lateral segment and apical anterior segment are hypokinetic. Right Ventricle: The right ventricular size is normal. No increase in right ventricular wall thickness. Right ventricular systolic function is moderately reduced. There is normal pulmonary artery systolic  pressure. The tricuspid regurgitant velocity is 2.36 m/s, and with an assumed right atrial pressure of 3 mmHg, the estimated right ventricular systolic pressure is 99991111 mmHg. Left Atrium: Left atrial size was normal in size. Right Atrium: Right atrial size was normal in size. Pericardium: Trivial pericardial effusion is present. Mitral Valve: The mitral valve is grossly normal. Mild mitral valve regurgitation. Tricuspid Valve: The tricuspid valve is normal in structure. Tricuspid valve regurgitation is trivial. Aortic Valve: The aortic valve is grossly normal. Aortic valve regurgitation is mild. Aortic regurgitation PHT measures 412 msec. No aortic stenosis is present. Pulmonic Valve: The pulmonic valve was grossly normal. Pulmonic valve regurgitation is trivial.  Aorta: Aortic dilatation noted. There is borderline dilatation of the ascending aorta, measuring 38 mm. Venous: The inferior vena cava is normal in size with greater than 50% respiratory variability, suggesting right atrial pressure of 3 mmHg. IAS/Shunts: No atrial level shunt detected by color flow Doppler.  LEFT VENTRICLE PLAX 2D LVIDd:         4.90 cm  Diastology LVIDs:         3.90 cm  LV e' medial:    5.11 cm/s LV PW:         1.20 cm  LV E/e' medial:  15.8 LV IVS:        1.00 cm  LV e' lateral:   10.40 cm/s LVOT diam:     2.00 cm  LV E/e' lateral: 7.8 LV SV:         68 LV SV Index:   35 LVOT Area:     3.14 cm  RIGHT VENTRICLE RV S prime:     6.96 cm/s TAPSE (M-mode): 1.0 cm LEFT ATRIUM             Index       RIGHT ATRIUM           Index LA diam:        4.00 cm 2.03 cm/m  RA Area:     20.80 cm LA Vol (A2C):   39.3 ml 19.97 ml/m RA Volume:   53.60 ml  27.24 ml/m LA Vol (A4C):   63.4 ml 32.22 ml/m LA Biplane Vol: 50.8 ml 25.82 ml/m  AORTIC VALVE LVOT Vmax:   126.00 cm/s LVOT Vmean:  97.600 cm/s LVOT VTI:    0.218 m AI PHT:      412 msec  AORTA Ao Root diam: 3.50 cm Ao Asc diam:  3.80 cm MITRAL VALVE               TRICUSPID VALVE MV Area (PHT):  3.39 cm    TR Peak grad:   22.3 mmHg MV Decel Time: 224 msec    TR Vmax:        236.00 cm/s MV E velocity: 80.60 cm/s MV A velocity: 85.30 cm/s  SHUNTS MV E/A ratio:  0.94        Systemic VTI:  0.22 m                            Systemic Diam: 2.00 cm Cherlynn Kaiser MD Electronically signed by Cherlynn Kaiser MD Signature Date/Time: 08/06/2020/4:34:29 PM    Final    VAS [place] LOWER EXTREMITY VENOUS (DVT)  Result Date: 08/06/2020  Lower Venous DVT Study Indications: Edema.  Comparison Study: No prior venous exams Performing Technologist: Rogelia Rohrer  Examination Guidelines: A complete evaluation includes B-mode imaging, spectral Doppler, color Doppler, and power Doppler as needed of all accessible portions of each vessel. Bilateral testing is considered an integral part of a complete examination. Limited examinations for reoccurring indications may be performed as noted. The reflux portion of the exam is performed with the patient in reverse Trendelenburg.  +---------+---------------+---------+-----------+----------+--------------+ RIGHT    CompressibilityPhasicitySpontaneityPropertiesThrombus Aging +---------+---------------+---------+-----------+----------+--------------+ CFV      Full           Yes      Yes                                 +---------+---------------+---------+-----------+----------+--------------+ SFJ  Full                                                        +---------+---------------+---------+-----------+----------+--------------+ FV Prox  Full           Yes      Yes                                 +---------+---------------+---------+-----------+----------+--------------+ FV Mid   Full           Yes      Yes                                 +---------+---------------+---------+-----------+----------+--------------+ FV DistalFull           Yes      Yes                                  +---------+---------------+---------+-----------+----------+--------------+ PFV      Full                                                        +---------+---------------+---------+-----------+----------+--------------+ POP      Full           Yes      Yes                                 +---------+---------------+---------+-----------+----------+--------------+ PTV      Full                                                        +---------+---------------+---------+-----------+----------+--------------+ PERO     Full                                                        +---------+---------------+---------+-----------+----------+--------------+   +---------+---------------+---------+-----------+----------+--------------+ LEFT     CompressibilityPhasicitySpontaneityPropertiesThrombus Aging +---------+---------------+---------+-----------+----------+--------------+ CFV      Full           Yes      Yes                                 +---------+---------------+---------+-----------+----------+--------------+ SFJ      Full                                                        +---------+---------------+---------+-----------+----------+--------------+ FV Prox  Full  Yes      Yes                                 +---------+---------------+---------+-----------+----------+--------------+ FV Mid   Full           Yes      Yes                                 +---------+---------------+---------+-----------+----------+--------------+ FV DistalFull           Yes      Yes                                 +---------+---------------+---------+-----------+----------+--------------+ PFV      Full                                                        +---------+---------------+---------+-----------+----------+--------------+ POP      Full           Yes      Yes                                  +---------+---------------+---------+-----------+----------+--------------+ PTV      Full                                                        +---------+---------------+---------+-----------+----------+--------------+ PERO     Full                                                        +---------+---------------+---------+-----------+----------+--------------+     Summary: BILATERAL: - No evidence of deep vein thrombosis seen in the lower extremities, bilaterally. - RIGHT: - No cystic structure found in the popliteal fossa.  LEFT: - No cystic structure found in the popliteal fossa.  *See table(s) above for measurements and observations. Electronically signed by Ruta Hinds MD on 08/06/2020 at 5:17:07 PM.    Final    CT CHEST ABDOMEN PELVIS WO CONTRAST  Result Date: 08/06/2020 CLINICAL DATA:  Abdominal pain. Nausea and vomiting. Fever of unknown origin EXAM: CT CHEST, ABDOMEN AND PELVIS WITHOUT CONTRAST TECHNIQUE: Multidetector CT imaging of the chest, abdomen and pelvis was performed following the standard protocol without IV contrast. COMPARISON:  AP CT on 08/03/2020, and chest CT on 07/06/2020 FINDINGS: CT CHEST FINDINGS Cardiovascular: No acute findings. Stable mild pericardial thickening. Aortic and coronary atherosclerotic calcification noted. Prior CABG. Mediastinum/Lymph Nodes: No masses or pathologically enlarged lymph nodes identified on this unenhanced exam. Lungs/Pleura: Tiny bilateral pleural effusions and dependent bibasilar atelectasis are new since previous study. No evidence of pulmonary consolidation or mass. Central tracheobronchial airways are patent. Musculoskeletal:  No suspicious bone lesions identified. CT ABDOMEN AND PELVIS FINDINGS Hepatobiliary: No masses visualized on this unenhanced exam.  High attenuation sludge and possible tiny calculi noted within the gallbladder, however there is no evidence of cholecystitis or biliary ductal dilatation. Pancreas: No mass or  inflammatory changes identified on this unenhanced exam. Spleen:  Within normal limits in size. Adrenals/Urinary Tract: A tiny 1-2 mm calculus is seen in the midpole the left kidney. No evidence of ureteral calculi or hydronephrosis. Bilateral renal sinus cysts are again noted. A Foley catheter is seen within the bladder which is collapsed. Stomach/Bowel: Moderate bowel wall thickening is seen involving multiple distal small bowel loops. Soft tissue stranding is seen throughout the small bowel mesentery, with tiny amount of ascites in Morison's pouch and pelvic cul-de-sac. No evidence of bowel obstruction or abscess. Vascular/Lymphatic: No pathologically enlarged lymph nodes identified. No abdominal aortic aneurysm. Aortic atherosclerotic calcification noted. Reproductive:  No masses or other significant abnormality. Other: Stable tiny paraumbilical ventral hernia, which contains only fat. No evidence of herniated bowel loops. Musculoskeletal:  No suspicious bone lesions identified. IMPRESSION: Moderate thickening involving multiple distal small bowel loops, consistent with enteritis. Associated mesenteric inflammatory changes and tiny amount of free fluid. No evidence of abscess. Tiny bilateral pleural effusions and dependent bibasilar atelectasis. Tiny nonobstructing left renal calculus. No evidence of ureteral calculi or hydronephrosis. Cholelithiasis and possible gallbladder sludge. No radiographic evidence of cholecystitis. Stable tiny paraumbilical ventral hernia, which contains only fat. Aortic Atherosclerosis (ICD10-I70.0). Electronically Signed   By: Marlaine Hind M.D.   On: 08/06/2020 14:11    Anti-infectives: Anti-infectives (From admission, onward)   Start     Dose/Rate Route Frequency Ordered Stop   08/06/20 1000  cefTRIAXone (ROCEPHIN) 2 g in sodium chloride 0.9 % 100 mL IVPB        2 g 200 mL/hr over 30 Minutes Intravenous Every 24 hours 08/06/20 0724     08/05/20 1200  cefTRIAXone (ROCEPHIN)  1 g in sodium chloride 0.9 % 100 mL IVPB  Status:  Discontinued        1 g 200 mL/hr over 30 Minutes Intravenous Every 24 hours 08/05/20 1103 08/06/20 0724   08/03/20 2330  piperacillin-tazobactam (ZOSYN) IVPB 3.375 g  Status:  Discontinued       "Followed by" Linked Group Details   3.375 g 12.5 mL/hr over 240 Minutes Intravenous Every 8 hours 08/03/20 1715 08/03/20 2223   08/03/20 1730  piperacillin-tazobactam (ZOSYN) IVPB 3.375 g       "Followed by" Linked Group Details   3.375 g 100 mL/hr over 30 Minutes Intravenous  Once 08/03/20 1715 08/03/20 2002       Assessment/Plan Hx of PE may 2021 Hx of COVID may 2021 HTN HLD Hx of NSTEMI T2DM Hard of hearing Depression  LV apical thrombus - noted on ECHO yesterday, per primary   Fat containing umbilical hernia - stable, no indication for emergent surgical intervention   Ileus from recent gastroenteritis - patient passing flatus, no BM, tolerating CLD - abdominal exam improving some, no BM but passing flatus - ok to advance to FLD - recommend mobilizing today, recommend keeping Mg >2.0 and K >4.0  Fever - none further overnight, no leukocytosis - CT A/P consistent with enteritis  - urine cx with no growth   FEN: FLD, IVF @45  mL/hr VTE: lovenox ID: [email or web address] 1/16>>  No indication for acute surgical intervention from an abdominal standpoint. Abdominal symptoms likely from enteritis. Recommend advancing to soft/bland diet when having BMs and sending stool for studies. Consider GI consult if ileus persisting. General surgery will sign off at this  time, however we are available as needed for questions or concerns.   LOS: 3 days    Norm Parcel , [facility] Surgery 08/07/2020, 9:05 AM Please see Amion for pager number during day hours 7:00am-4:30pm

## 2020-08-07 NOTE — Evaluation (Signed)
Occupational Therapy Evaluation Patient Details Name: Patrick Miranda MRN: [id number] DOB: 1945/12/03 Today's Date: 08/07/2020    History of Present Illness 75yo male presenting to [facility] [place] c/o nausea and vomiting as well as abdominal pain. Admitted for work up. PMH hx brain stem CVA, skin CA, covid-19, HTN, HOH, HLD, NSTEMI, hx PE, DM, CABG 06/2020, TKA   Clinical Impression   PTA, pt lives with family and reports Independence with all ADLs, IADLs and mobility without AD use. Pt presents now with deficits in abdominal pain, dynamic standing balance, endurance and overall strength. Pt min guard for mobility with use of IV pole for support. More unsteadiness noted without UE support during dynamic tasks. Pt noted with 2/4 DOE after mobility but improves with seated rest break. Pt requires Supervision for UB ADLs and min guard for LB ADLs due to deficits. Wife present and reports good support/assistance for pt at home as needed. Anticipate no skilled OT services needed on discharge but pt would benefit from OT services at acute level to maximize balance/endurance during ADL tasks to decrease fall risk.     Follow Up Recommendations  No OT follow up;Supervision - Intermittent (for OOB/mobility and showering tasks initially)    Equipment Recommendations  None recommended by OT (appears well equipped)    Recommendations for Other Services       Precautions / Restrictions Precautions Precautions: Fall;Sternal Precaution Booklet Issued: No Precaution Comments: sternal precautions- verbally reviewed (CABG done December 2021) Restrictions Weight Bearing Restrictions: No      Mobility Bed Mobility Overal bed mobility: Needs Assistance Bed Mobility: Sidelying to Sit   Sidelying to sit: Min assist;HOB elevated       General bed mobility comments: Min A to get to EOB, light assist for trunk and increased time/effort    Transfers Overall transfer level: Needs assistance Equipment used:  None (IV pole) Transfers: Sit to/from Stand Sit to Stand: Min guard         General transfer comment: min guard for steadying. able to complete short mobility in hallway with use of IV pole vs no UE support. Decreased steadiness without UE support.    Balance Overall balance assessment: Needs assistance Sitting-balance support: No upper extremity supported;Feet supported Sitting balance-Leahy Scale: Good     Standing balance support: Single extremity supported;No upper extremity supported;During functional activity Standing balance-Leahy Scale: Fair Standing balance comment: fair static standing but reliant on at least one UE support for dynamic tasks                           ADL either performed or assessed with clinical judgement   ADL Overall ADL's : Needs assistance/impaired Eating/Feeding: Independent;Sitting Eating/Feeding Details (indicate cue type and reason): Eating on entry Grooming: Set up;Sitting   Upper Body Bathing: Set up;Sitting   Lower Body Bathing: Min guard;Sit to/from stand   Upper Body Dressing : Set up;Sitting   Lower Body Dressing: Min guard;Sit to/from stand Lower Body Dressing Details (indicate cue type and reason): able to reach B feet using figure four position Toilet Transfer: Min guard;Ambulation   Toileting- Clothing Manipulation and Hygiene: Min guard;Sit to/from stand       Functional mobility during ADLs: Min guard General ADL Comments: Limited by decreased endurance, strength and dynamic standing balance. benefits from one UE support for standing tasks     Vision Baseline Vision/History: Wears glasses Wears Glasses: Reading only Patient Visual Report: No change from baseline Vision Assessment?: No  apparent visual deficits     Perception     Praxis      Pertinent Vitals/Pain Pain Assessment: 0-10 Pain Score: 5  Pain Location: lower abdomen Pain Descriptors / Indicators: Discomfort;Grimacing;Sharp Pain  Intervention(s): Monitored during session     Hand Dominance Right   Extremity/Trunk Assessment Upper Extremity Assessment Upper Extremity Assessment: Generalized weakness   Lower Extremity Assessment Lower Extremity Assessment: Defer to PT evaluation   Cervical / Trunk Assessment Cervical / Trunk Assessment: Kyphotic   Communication Communication Communication: No difficulties   Cognition Arousal/Alertness: Awake/alert Behavior During Therapy: WFL for tasks assessed/performed Overall Cognitive Status: Within Functional Limits for tasks assessed                                 General Comments: possibly some STM deficits- could not remember sternal precautions   General Comments  Pt noted with SOB 2/4 after mobilty, 100s HR, 96% O2 on RA. Wife present and supportive, encouraged pt to use shower chair, have assistance for IADLs at home and use cane vs RW if unsteadiness continues    Exercises     Shoulder Instructions      Home Living Family/patient expects to be discharged to:: Private residence Living Arrangements: Spouse/significant other;Children;Other (Comment) (granddaughter) Available Help at Discharge: Family;Available 24 hours/day Type of Home: House Home Access: Level entry     Home Layout: One level;Laundry or work area in basement     [employer] Shower/Tub: Occupational psychologist: Handicapped height ([place] over [place])     Home Equipment: Environmental consultant - 2 wheels;Bedside commode;Shower seat;Grab bars - tub/shower;Hand held shower head          Prior Functioning/Environment Level of Independence: Independent        Comments: Independent in all tasks, drives, yardwork, grocery shopping        OT Problem List: Decreased strength;Decreased activity tolerance;Impaired balance (sitting and/or standing);Decreased knowledge of precautions      OT Treatment/Interventions: Self-care/ADL training;Therapeutic exercise;Energy  conservation;DME and/or AE instruction;Therapeutic activities;Patient/family education;Balance training    OT Goals(Current goals can be found in the care plan section) Acute Rehab OT Goals Patient Stated Goal: to go home, decrease abdominal pain OT Goal Formulation: With patient/family Time For Goal Achievement: 08/21/20 Potential to Achieve Goals: Good ADL Goals Pt Will Perform Grooming: with modified independence;standing Pt Will Perform Lower Body Dressing: with modified independence;sitting/lateral leans;sit to/from stand Pt Will Transfer to Toilet: with modified independence;ambulating Additional ADL Goal #1: Pt to demonstrate ability to perform functional tasks >5-7 minutes without rest break in order to increase endurance  OT Frequency: Min 2X/week   Barriers to D/C:            Co-evaluation              AM-PAC OT "6 Clicks" Daily Activity     Outcome Measure Help from another person eating meals?: A Little Help from another person taking care of personal grooming?: A Little Help from another person toileting, which includes using toliet, bedpan, or urinal?: A Little Help from another person bathing (including washing, rinsing, drying)?: A Little Help from another person to put on and taking off regular upper body clothing?: A Little Help from another person to put on and taking off regular lower body clothing?: A Little 6 Click Score: 18   End of Session Equipment Utilized During Treatment: Gait belt Nurse Communication: Mobility status  Activity Tolerance: Patient  tolerated treatment well Patient left: in chair;with call bell/phone within reach;with family/visitor present  OT Visit Diagnosis: Unsteadiness on feet (R26.81);Other abnormalities of gait and mobility (R26.89);Muscle weakness (generalized) (M62.81)                Time: [id number] OT Time Calculation (min): 29 min Charges:  OT General Charges $OT Visit: 1 Visit OT Evaluation $OT Eval Moderate  Complexity: 1 Mod OT Treatments $Therapeutic Activity: 8-22 mins  Layla Maw, OTR/L  Layla Maw 08/07/2020, 12:59 PM

## 2020-08-07 NOTE — Progress Notes (Addendum)
Progress Note  Patient Name: Patrick Miranda Date of Encounter: 08/07/2020  [facility] Cardiologist: Rozann Lesches, MD   Subjective   No acute overnight events. Still have abdominal pain but no more nausea or vomiting. He is passing gas but has not had a bowel movement yet. No cardiac complaints - no chest pain, shortness of breath palpitations.  Inpatient Medications    Scheduled Meds: . aspirin  81 mg Oral Daily  . Chlorhexidine Gluconate Cloth  6 each Topical Daily  . fenofibrate  160 mg Oral Daily  . insulin aspart  0-15 Units Subcutaneous TID AC & HS  . insulin glargine  15 Units Subcutaneous Daily  . metoprolol succinate  50 mg Oral Daily  . rosuvastatin  20 mg Oral Daily  . tamsulosin  0.4 mg Oral Daily   Continuous Infusions: . sodium chloride 45 mL/hr at 08/06/20 1631  . cefTRIAXone (ROCEPHIN)  IV 2 g (08/06/20 0844)  . heparin 1,450 Units/hr (08/07/20 0428)   PRN Meds: acetaminophen **OR** acetaminophen, morphine injection **OR** oxyCODONE-acetaminophen, ondansetron **OR** ondansetron (ZOFRAN) IV, polyethylene glycol   Vital Signs    Vitals:   08/06/20 1115 08/06/20 1652 08/06/20 2013 08/07/20 0441  BP: 126/65 (!) 102/58 127/67 132/72  Pulse: 95 90 99 93  Resp: 16 14 16 16   Temp:  99.4 F (37.4 C) 99.4 F (37.4 C) 98.4 F (36.9 C)  TempSrc:  Oral Oral Oral  SpO2:  97% 93% 96%  Weight: 77.1 kg     Height: 5\' 11"  (1.803 m)       Intake/Output Summary (Last 24 hours) at 08/07/2020 0933 Last data filed at 08/07/2020 0600 Gross per 24 hour  Intake 2465.42 ml  Output 2725 ml  Net -259.58 ml   Last 3 Weights 08/06/2020 08/01/2020 07/25/2020  Weight (lbs) 170 lb 170 lb 171 lb  Weight (kg) 77.111 kg 77.111 kg 77.565 kg      Telemetry    Sinus rhythm with rates in the 90's to low 100's with frequent PVCs (often in bigeminy pattern). - Personally Reviewed  ECG    No new ECG tracing yet. - Personally Reviewed  Physical Exam   GEN: No acute  distress.   Neck: No JVD Cardiac: RR. No murmurs, rubs, or gallops.  Respiratory: Clear to auscultation bilaterally. No wheezes, rhonchi, or rales. GI: Soft, nontender, non-distended  MS: No lower extremity edema. No deformity. Skin: Warm and dry. Neuro:  No focal deficits. Psych: Normal affect. Responds appropriately.  Labs    High Sensitivity Troponin:   Recent Labs  Lab 08/04/20 0237  TROPONINIHS 44*      Chemistry Recent Labs  Lab 08/03/20 1756 08/04/20 0237 08/05/20 0331 08/06/20 0304  NA 140 138 136 136  K 4.3 4.1 3.6 3.6  CL 105 105 103 103  CO2 17* 19* 21* 23  GLUCOSE 212* 168* 127* 120*  BUN 27* 27* 31* 26*  CREATININE 1.51* 1.58* 1.64* 1.63*  CALCIUM 8.6* 8.4* 7.5* 7.6*  PROT 6.0* 5.8*  --   --   ALBUMIN 3.0* 2.7*  --   --   AST 62* 46*  --   --   ALT 19 17  --   --   ALKPHOS 83 65  --   --   BILITOT 1.2 1.0  --   --   GFRNONAA 48* 45* 43* 44*  ANIONGAP 18* 14 12 10      Hematology Recent Labs  Lab 08/05/20 0331 08/06/20 0304 08/07/20 0249  WBC 11.4* 9.3 9.7  RBC 3.14* 2.96* 2.91*  HGB 8.9* 8.5* 8.3*  HCT 28.9* 26.6* 25.9*  MCV 92.0 89.9 89.0  MCH 28.3 28.7 28.5  MCHC 30.8 32.0 32.0  RDW 15.8* 15.6* 15.6*  PLT 450* 442* 425*    BNP Recent Labs  Lab 08/04/20 0237  BNP 656.9*     DDimer No results for input(s): DDIMER in the last 168 hours.   Radiology    DG Abd 1 View  Result Date: 08/05/2020 CLINICAL DATA:  Insert epic study no EXAM: ABDOMEN - 1 VIEW COMPARISON:  Abdominal radiograph 08/04/2020 FINDINGS: Persistent gaseous distension of both small and large bowel loops, similar to prior, most likely representing ileus. No pneumatosis or supine evidence of free air. No unexpected radiopaque calcification. No acute finding in the visualized skeleton. IMPRESSION: Persistent gaseous distension of both small and large bowel loops, similar to prior, most likely representing ileus. Electronically Signed   By: Audie Pinto M.D.   On:  08/05/2020 10:07   ECHOCARDIOGRAM COMPLETE  Result Date: 08/06/2020    ECHOCARDIOGRAM REPORT   Patient Name:   Patrick Miranda Date of Exam: 08/06/2020 Medical Rec #:  [id number]          Height:       71.0 in Accession #:    [id number]         Weight:       170.0 lb Date of Birth:  05-17-1946          BSA:          1.968 m Patient Age:    75 years           BP:           126/65 mmHg Patient Gender: M                  HR:           90 bpm. Exam Location:  Inpatient Procedure: 2D Echo, 3D Echo, Color Doppler and Cardiac Doppler Indications:    Fever  History:        Patient has prior history of Echocardiogram examinations, most                 recent 07/09/2020. Prior CABG, Arrythmias:Atrial Fibrillation;                 Risk Factors:Hypertension, Diabetes, Dyslipidemia and Sleep                 Apnea.  Sonographer:    Raquel Sarna Senior RDCS Referring Phys: [phone] BELKYS A REGALADO IMPRESSIONS  1. Probable LV apical thrombus. 1.5 x 1.5 cm, with heterogeneous echo texture. Centrally more echo lucent.  2. Left ventricular ejection fraction, by estimation, is 45%. The left ventricle has mildly decreased function. The left ventricle demonstrates regional wall motion abnormalities, large basal inferior and inferoseptal aneurysm, which appears unchanged compared to intraoperative TEE. Left ventricular diastolic parameters are indeterminate.  3. Right ventricular systolic function is moderately reduced. The right ventricular size is normal. There is normal pulmonary artery systolic pressure. The estimated right ventricular systolic pressure is 99991111 mmHg.  4. The mitral valve is grossly normal. Mild mitral valve regurgitation.  5. The aortic valve is grossly normal. Aortic valve regurgitation is mild. No aortic stenosis is present.  6. Aortic dilatation noted. There is borderline dilatation of the ascending aorta, measuring 38 mm.  7. The inferior vena cava is normal in size with greater than 50% respiratory  variability,  suggesting right atrial pressure of 3 mmHg. Conclusion(s)/Recommendation(s): Critical findings reported to Dr. Zorita Pang and acknowledged at 4:26 PM 08/06/20. FINDINGS  Left Ventricle: Left ventricular ejection fraction, by estimation, is 45%. The left ventricle has mildly decreased function. The left ventricle demonstrates regional wall motion abnormalities. The left ventricular internal cavity size was normal in size. There is no left ventricular hypertrophy. Left ventricular diastolic parameters are indeterminate.  LV Wall Scoring: The basal inferolateral segment is aneurysmal. The mid inferolateral segment, apical septal segment, and apical inferior segment are akinetic. The apical lateral segment and apical anterior segment are hypokinetic. Right Ventricle: The right ventricular size is normal. No increase in right ventricular wall thickness. Right ventricular systolic function is moderately reduced. There is normal pulmonary artery systolic pressure. The tricuspid regurgitant velocity is 2.36 m/s, and with an assumed right atrial pressure of 3 mmHg, the estimated right ventricular systolic pressure is 99991111 mmHg. Left Atrium: Left atrial size was normal in size. Right Atrium: Right atrial size was normal in size. Pericardium: Trivial pericardial effusion is present. Mitral Valve: The mitral valve is grossly normal. Mild mitral valve regurgitation. Tricuspid Valve: The tricuspid valve is normal in structure. Tricuspid valve regurgitation is trivial. Aortic Valve: The aortic valve is grossly normal. Aortic valve regurgitation is mild. Aortic regurgitation PHT measures 412 msec. No aortic stenosis is present. Pulmonic Valve: The pulmonic valve was grossly normal. Pulmonic valve regurgitation is trivial. Aorta: Aortic dilatation noted. There is borderline dilatation of the ascending aorta, measuring 38 mm. Venous: The inferior vena cava is normal in size with greater than 50% respiratory variability, suggesting  right atrial pressure of 3 mmHg. IAS/Shunts: No atrial level shunt detected by color flow Doppler.  LEFT VENTRICLE PLAX 2D LVIDd:         4.90 cm  Diastology LVIDs:         3.90 cm  LV e' medial:    5.11 cm/s LV PW:         1.20 cm  LV E/e' medial:  15.8 LV IVS:        1.00 cm  LV e' lateral:   10.40 cm/s LVOT diam:     2.00 cm  LV E/e' lateral: 7.8 LV SV:         68 LV SV Index:   35 LVOT Area:     3.14 cm  RIGHT VENTRICLE RV S prime:     6.96 cm/s TAPSE (M-mode): 1.0 cm LEFT ATRIUM             Index       RIGHT ATRIUM           Index LA diam:        4.00 cm 2.03 cm/m  RA Area:     20.80 cm LA Vol (A2C):   39.3 ml 19.97 ml/m RA Volume:   53.60 ml  27.24 ml/m LA Vol (A4C):   63.4 ml 32.22 ml/m LA Biplane Vol: 50.8 ml 25.82 ml/m  AORTIC VALVE LVOT Vmax:   126.00 cm/s LVOT Vmean:  97.600 cm/s LVOT VTI:    0.218 m AI PHT:      412 msec  AORTA Ao Root diam: 3.50 cm Ao Asc diam:  3.80 cm MITRAL VALVE               TRICUSPID VALVE MV Area (PHT): 3.39 cm    TR Peak grad:   22.3 mmHg MV Decel Time: 224 msec    TR Vmax:  236.00 cm/s MV E velocity: 80.60 cm/s MV A velocity: 85.30 cm/s  SHUNTS MV E/A ratio:  0.94        Systemic VTI:  0.22 m                            Systemic Diam: 2.00 cm Cherlynn Kaiser MD Electronically signed by Cherlynn Kaiser MD Signature Date/Time: 08/06/2020/4:34:29 PM    Final    VAS [place] LOWER EXTREMITY VENOUS (DVT)  Result Date: 08/06/2020  Lower Venous DVT Study Indications: Edema.  Comparison Study: No prior venous exams Performing Technologist: Rogelia Rohrer  Examination Guidelines: A complete evaluation includes B-mode imaging, spectral Doppler, color Doppler, and power Doppler as needed of all accessible portions of each vessel. Bilateral testing is considered an integral part of a complete examination. Limited examinations for reoccurring indications may be performed as noted. The reflux portion of the exam is performed with the patient in reverse Trendelenburg.   +---------+---------------+---------+-----------+----------+--------------+ RIGHT    CompressibilityPhasicitySpontaneityPropertiesThrombus Aging +---------+---------------+---------+-----------+----------+--------------+ CFV      Full           Yes      Yes                                 +---------+---------------+---------+-----------+----------+--------------+ SFJ      Full                                                        +---------+---------------+---------+-----------+----------+--------------+ FV Prox  Full           Yes      Yes                                 +---------+---------------+---------+-----------+----------+--------------+ FV Mid   Full           Yes      Yes                                 +---------+---------------+---------+-----------+----------+--------------+ FV DistalFull           Yes      Yes                                 +---------+---------------+---------+-----------+----------+--------------+ PFV      Full                                                        +---------+---------------+---------+-----------+----------+--------------+ POP      Full           Yes      Yes                                 +---------+---------------+---------+-----------+----------+--------------+ PTV      Full                                                        +---------+---------------+---------+-----------+----------+--------------+  PERO     Full                                                        +---------+---------------+---------+-----------+----------+--------------+   +---------+---------------+---------+-----------+----------+--------------+ LEFT     CompressibilityPhasicitySpontaneityPropertiesThrombus Aging +---------+---------------+---------+-----------+----------+--------------+ CFV      Full           Yes      Yes                                  +---------+---------------+---------+-----------+----------+--------------+ SFJ      Full                                                        +---------+---------------+---------+-----------+----------+--------------+ FV Prox  Full           Yes      Yes                                 +---------+---------------+---------+-----------+----------+--------------+ FV Mid   Full           Yes      Yes                                 +---------+---------------+---------+-----------+----------+--------------+ FV DistalFull           Yes      Yes                                 +---------+---------------+---------+-----------+----------+--------------+ PFV      Full                                                        +---------+---------------+---------+-----------+----------+--------------+ POP      Full           Yes      Yes                                 +---------+---------------+---------+-----------+----------+--------------+ PTV      Full                                                        +---------+---------------+---------+-----------+----------+--------------+ PERO     Full                                                        +---------+---------------+---------+-----------+----------+--------------+  Summary: BILATERAL: - No evidence of deep vein thrombosis seen in the lower extremities, bilaterally. - RIGHT: - No cystic structure found in the popliteal fossa.  LEFT: - No cystic structure found in the popliteal fossa.  *See table(s) above for measurements and observations. Electronically signed by Ruta Hinds MD on 08/06/2020 at 5:17:07 PM.    Final    CT CHEST ABDOMEN PELVIS WO CONTRAST  Result Date: 08/06/2020 CLINICAL DATA:  Abdominal pain. Nausea and vomiting. Fever of unknown origin EXAM: CT CHEST, ABDOMEN AND PELVIS WITHOUT CONTRAST TECHNIQUE: Multidetector CT imaging of the chest, abdomen and pelvis was performed following the  standard protocol without IV contrast. COMPARISON:  AP CT on 08/03/2020, and chest CT on 07/06/2020 FINDINGS: CT CHEST FINDINGS Cardiovascular: No acute findings. Stable mild pericardial thickening. Aortic and coronary atherosclerotic calcification noted. Prior CABG. Mediastinum/Lymph Nodes: No masses or pathologically enlarged lymph nodes identified on this unenhanced exam. Lungs/Pleura: Tiny bilateral pleural effusions and dependent bibasilar atelectasis are new since previous study. No evidence of pulmonary consolidation or mass. Central tracheobronchial airways are patent. Musculoskeletal:  No suspicious bone lesions identified. CT ABDOMEN AND PELVIS FINDINGS Hepatobiliary: No masses visualized on this unenhanced exam. High attenuation sludge and possible tiny calculi noted within the gallbladder, however there is no evidence of cholecystitis or biliary ductal dilatation. Pancreas: No mass or inflammatory changes identified on this unenhanced exam. Spleen:  Within normal limits in size. Adrenals/Urinary Tract: A tiny 1-2 mm calculus is seen in the midpole the left kidney. No evidence of ureteral calculi or hydronephrosis. Bilateral renal sinus cysts are again noted. A Foley catheter is seen within the bladder which is collapsed. Stomach/Bowel: Moderate bowel wall thickening is seen involving multiple distal small bowel loops. Soft tissue stranding is seen throughout the small bowel mesentery, with tiny amount of ascites in Morison's pouch and pelvic cul-de-sac. No evidence of bowel obstruction or abscess. Vascular/Lymphatic: No pathologically enlarged lymph nodes identified. No abdominal aortic aneurysm. Aortic atherosclerotic calcification noted. Reproductive:  No masses or other significant abnormality. Other: Stable tiny paraumbilical ventral hernia, which contains only fat. No evidence of herniated bowel loops. Musculoskeletal:  No suspicious bone lesions identified. IMPRESSION: Moderate thickening involving  multiple distal small bowel loops, consistent with enteritis. Associated mesenteric inflammatory changes and tiny amount of free fluid. No evidence of abscess. Tiny bilateral pleural effusions and dependent bibasilar atelectasis. Tiny nonobstructing left renal calculus. No evidence of ureteral calculi or hydronephrosis. Cholelithiasis and possible gallbladder sludge. No radiographic evidence of cholecystitis. Stable tiny paraumbilical ventral hernia, which contains only fat. Aortic Atherosclerosis (ICD10-I70.0). Electronically Signed   By: Marlaine Hind M.D.   On: 08/06/2020 14:11    Cardiac Studies   Left Cardiac Catheterization 07/04/2020:  Ost LAD to Prox LAD lesion is 40% stenosed.  2nd Diag lesion is 90% stenosed.  1st Diag-1 lesion is 95% stenosed.  1st Diag-2 lesion is 90% stenosed with 90% stenosed side branch in Lat 1st Diag.  Mid LAD-1 lesion is 90% stenosed.  Mid LAD-2 lesion is 70% stenosed.  Lat 2nd Mrg lesion is 95% stenosed.  1st Mrg lesion is 20% stenosed.  Prox RCA lesion is 95% stenosed.  Dist LAD lesion is 50% stenosed.   Severe coronary obstructive disease with significant calcification involving the LAD.  The LAD is diffusely diseased extending from the proximal to mid segment with 40% diffuse proximal stenosis, calcified 90% stenosis on a bend in the vessel in the region of the first and second diagonal vessels with 95% stenoses  in the ostium of the first diagonal and 90% in the second diagonal, 70% mid stenosis and 50% mid distal stenoses.  The circumflex vessel has 20% smooth narrowing in the first marginal branch with 95% focal stenosis in a branch of the second marginal vessel.  The RCA has a focal 95% mid stenosis with reduced antegrade distal flow secondary to competitive left-to-right collateralization.  Normal LV function with EF estimated at 55 mm; LVEDP 12 mm Hg.  Recommendation: Surgical consultation for CABG revascularization.  Patient will  need to hold Plavix and will need Plavix washout. _______________  Echocardiogram 08/07/2019: Impressions: 1. Probable LV apical thrombus. 1.5 x 1.5 cm, with heterogeneous echo  texture. Centrally more echo lucent.  2. Left ventricular ejection fraction, by estimation, is 45%. The left  ventricle has mildly decreased function. The left ventricle demonstrates  regional wall motion abnormalities, large basal inferior and inferoseptal  aneurysm, which appears unchanged  compared to intraoperative TEE. Left ventricular diastolic parameters are  indeterminate.  3. Right ventricular systolic function is moderately reduced. The right  ventricular size is normal. There is normal pulmonary artery systolic  pressure. The estimated right ventricular systolic pressure is 99991111 mmHg.  4. The mitral valve is grossly normal. Mild mitral valve regurgitation.  5. The aortic valve is grossly normal. Aortic valve regurgitation is  mild. No aortic stenosis is present.  6. Aortic dilatation noted. There is borderline dilatation of the  ascending aorta, measuring 38 mm.  7. The inferior vena cava is normal in size with greater than 50%  respiratory variability, suggesting right atrial pressure of 3 mmHg.   Conclusion(s)/Recommendation(s): Critical findings reported to Dr.  Zorita Pang and acknowledged at 4:26 PM 08/06/20.  Patient Profile     75 y.o. male with a history of CAD s/p CABG x3 in 06/2020, chronic heart failure with mildly reduced EF, paroxysmal atrial fibrillation in the setting of COVID and post CABG, PE related to COVID infection in 11/2019, hypertension, hyperlipidemia, type 2 diabetes, obstructive sleep apnea on CPAP, and depression who is being evaluated for new LV thrombus at the request of Dr. Lonie Peak.   Assessment & Plan    LV Thrombus - Echo on 08/06/2020 showed LVEF of 45% with large basal inferior and inferoseptal aneurysm and probable LV apical thrombus measuring 1.5 x 1.5 cm.  Likely secondary to aneurysm and remodeling. - Currently on IV Heparin. Plan is to transition to Coumadin once there is clear etiology of his abdominal pain and there is no plans for surgery. Per surgeries note from today, they are not planning any surgical intervention so Coumadin can likely be started now.  CAD s/p Recent CABG - Recent CABG x3 on 07/09/2020 by Dr. Lawson Fiscal. - No chest pain.  - Continue aspirin, beta-blocker, and high-intensity statin.  Ischemic Cardiomyopathy Heart Failure with Mildly Reduced EF - Echo as above. - Appears euvolemic on exam. - Continue Toprol-XL.  - Consider adding ARB if renal function allows. - Continue to monitor volume status closely.  Paroxysmal Atrial Fibrillation Frequent PVCs - Previous episodes of atrial fibrillation occurred in setting of COVID and post-operatively after CABG. - Currently in sinus rhythm with frequent PVC (sometimes in bigeminy pattern). - Potassium 3.6 today.  - Magnesium 2.2 today. - TSH normal in 06/2020. - Will increase Toprol-XL to 75mg  daily. - CHA2DS2-VASc = 8. (CHF, HTN, CAD, DM, stroke/PE x2, age x2). Has not been on anticoagulation at home. Currently on IV Heparin with plans to transition to Coumadin when able as  above.  Hypertension - BP well controlled. - Continue Toprol-XL as above.  Hyperlipidemia - Continue home Crestor 20mg  daily.  CDK Stage III - Creatinine 1.36 today. Baseline around 1.4 to 1.6. - Continue to monitor closely.  Otherwise, per primary team: - Abdominal Pain - Umbilical hernia - Possible food-born illness vs gastroenteritis - SIRS - Thrombocytosis  - Type 2 diabetes     For questions or updates, please contact [facility] Please consult [email or web address] for contact info under        Signed, Darreld Mclean, PA-C  08/07/2020, 9:33 AM    Personally seen and examined. Agree with APP above with the following comments: Briefly  75 yo M with new LV throbus Patient notes  persistent abdominal pain but is improving his PO intake Exam notable for PVCs in the patter of bigeminy Labs notable for stable hgb on heparin Discussed with primary MD, patient and family; would recommend coumadin as the oral anticoagulant of choice in the setting of LV thrombus.  Though there is a small body of work that has shown Eliquis may be safe in the population, this is an off label use and would principally only use it if there was significant concern the patient would not be able to tolerate coumadin secondary to his abdominal issues.  [facility] will sign off.   Medication Recommendations:  Oral anticoagulation start as above Other recommendations (labs, testing, etc):  Coumadin clinic referral Follow up as an outpatient:  We will work on arranging follow up.  Werner Lean, MD

## 2020-08-08 DIAGNOSIS — I10 Essential (primary) hypertension: Secondary | ICD-10-CM | POA: Diagnosis not present

## 2020-08-08 DIAGNOSIS — N1831 Chronic kidney disease, stage 3a: Secondary | ICD-10-CM | POA: Diagnosis not present

## 2020-08-08 DIAGNOSIS — R109 Unspecified abdominal pain: Secondary | ICD-10-CM | POA: Diagnosis not present

## 2020-08-08 LAB — CBC
HCT: 26.7 % — ABNORMAL LOW (ref 39.0–52.0)
Hemoglobin: 8.3 g/dL — ABNORMAL LOW (ref 13.0–17.0)
MCH: 27.8 pg (ref 26.0–34.0)
MCHC: 31.1 g/dL (ref 30.0–36.0)
MCV: 89.3 fL (ref 80.0–100.0)
Platelets: 433 10*3/uL — ABNORMAL HIGH (ref 150–400)
RBC: 2.99 MIL/uL — ABNORMAL LOW (ref 4.22–5.81)
RDW: 15.9 % — ABNORMAL HIGH (ref 11.5–15.5)
WBC: 7.5 10*3/uL (ref 4.0–10.5)
nRBC: 0 % (ref 0.0–0.2)

## 2020-08-08 LAB — CULTURE, BLOOD (ROUTINE X 2)
Culture: NO GROWTH
Culture: NO GROWTH
Special Requests: ADEQUATE
Special Requests: ADEQUATE

## 2020-08-08 LAB — MAGNESIUM: Magnesium: 2 mg/dL (ref 1.7–2.4)

## 2020-08-08 LAB — GLUCOSE, CAPILLARY
Glucose-Capillary: 131 mg/dL — ABNORMAL HIGH (ref 70–99)
Glucose-Capillary: 162 mg/dL — ABNORMAL HIGH (ref 70–99)
Glucose-Capillary: 147 mg/dL — ABNORMAL HIGH (ref 70–99)
Glucose-Capillary: 140 mg/dL — ABNORMAL HIGH (ref 70–99)

## 2020-08-08 LAB — HEPARIN LEVEL (UNFRACTIONATED)
Heparin Unfractionated: <0.1 IU/mL — ABNORMAL LOW (ref 0.30–0.70)
Heparin Unfractionated: <0.1 IU/mL — ABNORMAL LOW (ref 0.30–0.70)

## 2020-08-08 MED ORDER — SENNOSIDES-DOCUSATE SODIUM 8.6-50 MG PO TABS
1.0000 | ORAL_TABLET | Freq: Two times a day (BID) | ORAL | Status: DC
  Administered 2020-08-08 – 2020-08-10 (×4): 1 via ORAL
  Filled 2020-08-08 (×4): qty 1

## 2020-08-08 MED ORDER — HEPARIN BOLUS VIA INFUSION
3000.0000 [IU] | Freq: Once | INTRAVENOUS | Status: AC
  Administered 2020-08-08: 3000 [IU] via INTRAVENOUS
  Filled 2020-08-08: qty 3000

## 2020-08-08 MED ORDER — WARFARIN SODIUM 6 MG PO TABS
6.0000 mg | ORAL_TABLET | Freq: Once | ORAL | Status: AC
  Administered 2020-08-08: 6 mg via ORAL
  Filled 2020-08-08: qty 1

## 2020-08-08 MED ORDER — HEPARIN BOLUS VIA INFUSION
2000.0000 [IU] | Freq: Once | INTRAVENOUS | Status: DC
  Filled 2020-08-08: qty 2000

## 2020-08-08 MED ORDER — WARFARIN - PHARMACIST DOSING INPATIENT: Freq: Every day | Status: DC

## 2020-08-08 MED ORDER — HEPARIN BOLUS VIA INFUSION
1500.0000 [IU] | Freq: Once | INTRAVENOUS | Status: AC
  Administered 2020-08-08: 1500 [IU] via INTRAVENOUS
  Filled 2020-08-08: qty 1500

## 2020-08-08 MED ORDER — ENOXAPARIN SODIUM 80 MG/0.8ML ~~LOC~~ SOLN
1.0000 mg/kg | Freq: Two times a day (BID) | SUBCUTANEOUS | Status: DC
  Administered 2020-08-08 – 2020-08-10 (×4): 80 mg via SUBCUTANEOUS
  Filled 2020-08-08 (×4): qty 0.8

## 2020-08-08 NOTE — Progress Notes (Signed)
PROGRESS NOTE    Patrick Miranda  [id number] DOB: February 11, 1946 DOA: 08/03/2020 PCP: Burnard Bunting, MD   Brief Narrative: 74/M w medical history significant for CAD/recent CABG 06/2020, obstructive sleep apnea, paroxysmal A. fib, insulin-dependent diabetes type 2, hypertension, chronic kidney disease stage IIIa who presented to [facility] complaining of nausea vomiting and abdominal pain. -morning of admission after eating breakfast fried egg, 45 days old rice and refrigerated previously opened can of peaches he began to experience severe abdominal pain.  Symptoms accompanied by multiple episodes of vomiting.  Symptoms persisted and eventually patient presented to [facility]. -Work-up in the ED noted mild leukocytosis, CT without IV contrast noted moderate thickening involving multiple distal small bowel loops,consistent with enteritis and associated mesenteric inflammatory changes and tiny amount of free fluid.   Assessment & Plan:  Abdominal Pain, Nausea, Vomiting:  -Possibilities are gastroenteritis, versus mesenteric ischemia secondary to LV thrombus, CT without IV contrast showed small bowel thickening , KUB suggestive of ileus -Seen by general surgery in consultation, recommended bowel rest and supportive care -Add laxatives today, increase activity, ambulation -Advance diet as tolerated -Also had a low-grade fever this admission, infectious work-up negative thus far, on day 4 of ceftriaxone  LV thrombus -Recent CABG, echo on 1/17 noted EF of 45% with large basal inferior and inferior septal aneurysm and probable LV thrombus measuring 1.5X 1.5 cm -Currently on IV heparin, start Coumadin today, general surgery has signed off  Ischemic cardiomyopathy, EF 45% -Clinically appears euvolemic, continue Toprol  Paroxysmal atrial fibrillation -Continue Toprol, heparin drip, start Coumadin today  CKD 3A -Creatinine stable at baseline, monitor  Urine retention;   -Started on Flomax.  -Voiding trial, attempt to remove Foley catheter and ambulate as tolerated  CAD S/P CABG;  -recent CABG 06/2020. Follow up with Dr Lucianne Lei trigt, CVTS was notified by [facility] of admission  Diabetes type 2;  -CBGs are stable, continue Lantus and sliding scale insulin  Thrombocytosis;  -Continue with aspirin.  -Likely reactive, improving  Estimated body mass index is 24.91 kg/m as calculated from the following:   Height as of this encounter: 5\' 11"  (1.803 m).   Weight as of this encounter: 81 kg.   DVT prophylaxis: Heparin, start Coumadin Code Status: Full code Family Communication: Wife at bedside Disposition Plan:  Status is: inpatient.  Remains inpatient appropriate due to severity of illness  Dispo: The patient is from: Home              Anticipated d/c is to: Home              Anticipated d/c date is: 2 days              Patient currently is not medically stable to d/c.  Consultants:   Surgery, cardiology  Procedures:   none  Antimicrobials:    Subjective: -Having mild lower abdominal discomfort, no nausea or vomiting, tolerating diet   Objective: Vitals:   08/07/20 1653 08/07/20 2048 08/08/20 0433 08/08/20 0944  BP: 133/73 (!) 142/68 (!) 141/78 (!) 147/75  Pulse: 89 89 94 95  Resp: 16 18 18 18   Temp: 99.2 F (37.3 C) 99 F (37.2 C) 99 F (37.2 C) 98.9 F (37.2 C)  TempSrc: Oral Oral Oral Oral  SpO2: 96% 97% 97% 96%  Weight:  81 kg    Height:        Intake/Output Summary (Last 24 hours) at 08/08/2020 1420 Last data filed at 08/08/2020 1259 Gross per 24  hour  Intake 1695.43 ml  Output 2675 ml  Net -979.57 ml   Filed Weights   08/06/20 1115 08/07/20 2048  Weight: 77.1 kg 81 kg    Examination:  General exam: Elderly pleasant male sitting up in bed, AAOx3 CVS: S1-S2, regular rate rhythm Lungs: Decreased breath sounds to bases Abdomen: Soft, mildly distended, nontender, bowel sounds diminished but  present Extremities: No edema GU: Foley catheter noted Skin: No rashes on exposed skin    Data Reviewed: I have personally reviewed following labs and imaging studies  CBC: Recent Labs  Lab 08/03/20 1756 08/04/20 0237 08/05/20 0331 08/06/20 0304 08/07/20 0249 08/08/20 0303  WBC 11.5* 10.6* 11.4* 9.3 9.7 7.5  NEUTROABS 10.2* 8.9*  --   --   --   --   HGB 10.0* 9.2* 8.9* 8.5* 8.3* 8.3*  HCT 31.4* 28.7* 28.9* 26.6* 25.9* 26.7*  MCV 91.8 91.7 92.0 89.9 89.0 89.3  PLT 636* 539* 450* 442* 425* 829*   Basic Metabolic Panel: Recent Labs  Lab 08/03/20 1756 08/04/20 0237 08/05/20 0331 08/06/20 0304 08/07/20 0249 08/07/20 0912 08/08/20 0303  NA 140 138 136 136  --  133*  --   K 4.3 4.1 3.6 3.6  --  3.6  --   CL 105 105 103 103  --  102  --   CO2 17* 19* 21* 23  --  17*  --   GLUCOSE 212* 168* 127* 120*  --  209*  --   BUN 27* 27* 31* 26*  --  19  --   CREATININE 1.51* 1.58* 1.64* 1.63*  --  1.36*  --   CALCIUM 8.6* 8.4* 7.5* 7.6*  --  7.5*  --   MG  --  1.7  --  2.1 2.2  --  2.0  PHOS  --  4.6  --   --   --   --   --    GFR: Estimated Creatinine Clearance: 50 mL/min (A) (by C-G formula based on SCr of 1.36 mg/dL (H)). Liver Function Tests: Recent Labs  Lab 08/03/20 1756 08/04/20 0237  AST 62* 46*  ALT 19 17  ALKPHOS 83 65  BILITOT 1.2 1.0  PROT 6.0* 5.8*  ALBUMIN 3.0* 2.7*   No results for input(s): LIPASE, AMYLASE in the last 168 hours. No results for input(s): AMMONIA in the last 168 hours. Coagulation Profile: Recent Labs  Lab 08/03/20 1756  INR 1.3*   Cardiac Enzymes: No results for input(s): CKTOTAL, CKMB, CKMBINDEX, TROPONINI in the last 168 hours. BNP (last 3 results) No results for input(s): PROBNP in the last 8760 hours. HbA1C: No results for input(s): HGBA1C in the last 72 hours. CBG: Recent Labs  Lab 08/07/20 1130 08/07/20 1651 08/07/20 2049 08/08/20 0643 08/08/20 1156  GLUCAP 173* 138* 160* 131* 162*   Lipid Profile: No results for  input(s): CHOL, HDL, LDLCALC, TRIG, CHOLHDL, LDLDIRECT in the last 72 hours. Thyroid Function Tests: No results for input(s): TSH, T4TOTAL, FREET4, T3FREE, THYROIDAB in the last 72 hours. Anemia Panel: No results for input(s): VITAMINB12, FOLATE, FERRITIN, TIBC, IRON, RETICCTPCT in the last 72 hours. Sepsis Labs: Recent Labs  Lab 08/03/20 1756 08/04/20 0237  LATICACIDVEN 1.8 1.4    Recent Results (from the past 240 hour(s))  Blood Culture (routine x 2)     Status: None   Collection Time: 08/03/20  5:09 PM   Specimen: BLOOD  Result Value Ref Range Status   Specimen Description BLOOD SITE NOT SPECIFIED  Final  Special Requests   Final    BOTTLES DRAWN AEROBIC AND ANAEROBIC Blood Culture adequate volume   Culture   Final    NO GROWTH 5 DAYS Performed at [facility], [place] [address] [place], [place] [address]    Report Status 08/08/2020 FINAL  Final  Blood Culture (routine x 2)     Status: None   Collection Time: 08/03/20  5:57 PM   Specimen: BLOOD  Result Value Ref Range Status   Specimen Description BLOOD SITE NOT SPECIFIED  Final   Special Requests   Final    BOTTLES DRAWN AEROBIC AND ANAEROBIC Blood Culture adequate volume   Culture   Final    NO GROWTH 5 DAYS Performed at [facility], [place] [address], [place], [place] [address]    Report Status 08/08/2020 FINAL  Final  Urine culture     Status: Abnormal   Collection Time: 08/03/20  7:14 PM   Specimen: In/Out Cath Urine  Result Value Ref Range Status   Specimen Description IN/OUT CATH URINE  Final   Special Requests   Final    NONE Performed at [facility], [place] [address] [place], [place] [address]    Culture MULTIPLE SPECIES PRESENT, SUGGEST RECOLLECTION (A)  Final   Report Status 08/05/2020 FINAL  Final  Resp Panel by RT-PCR (Flu A&B, Covid)     Status: None   Collection Time: 08/03/20  7:14 PM   Specimen: Nasopharyngeal(NP) swabs in vial transport medium  Result Value Ref Range Status    SARS Coronavirus 2 by RT PCR NEGATIVE NEGATIVE Final    Comment: (NOTE) SARS-CoV-2 target nucleic acids are NOT DETECTED.  The SARS-CoV-2 RNA is generally detectable in upper respiratory specimens during the acute phase of infection. The lowest concentration of SARS-CoV-2 viral copies this assay can detect is 138 copies/mL. A negative result does not preclude SARS-Cov-2 infection and should not be used as the sole basis for treatment or other patient management decisions. A negative result may occur with  improper specimen collection/handling, submission of specimen other than nasopharyngeal swab, presence of viral mutation(s) within the areas targeted by this assay, and inadequate number of viral copies(<138 copies/mL). A negative result must be combined with clinical observations, patient history, and epidemiological information. The expected result is Negative.  Fact Sheet for Patients:  EntrepreneurPulse.com.au  Fact Sheet for Healthcare Providers:  IncredibleEmployment.be  This test is no t yet approved or cleared by the [place] FDA and  has been authorized for detection and/or diagnosis of SARS-CoV-2 by FDA under an Emergency Use Authorization (EUA). This EUA will remain  in effect (meaning this test can be used) for the duration of the COVID-19 declaration under Section 564(b)(1) of the Act, 21 U.S.C.section 360bbb-3(b)(1), unless the authorization is terminated  or revoked sooner.       Influenza A by PCR NEGATIVE NEGATIVE Final   Influenza B by PCR NEGATIVE NEGATIVE Final    Comment: (NOTE) The Xpert Xpress SARS-CoV-2/FLU/RSV plus assay is intended as an aid in the diagnosis of influenza from Nasopharyngeal swab specimens and should not be used as a sole basis for treatment. Nasal washings and aspirates are unacceptable for Xpert Xpress SARS-CoV-2/FLU/RSV testing.  Fact Sheet for  Patients: EntrepreneurPulse.com.au  Fact Sheet for Healthcare Providers: IncredibleEmployment.be  This test is not yet approved or cleared by the [place] FDA and has been authorized for detection and/or diagnosis of SARS-CoV-2 by FDA under an Emergency Use Authorization (EUA).  This EUA will remain in effect (meaning this test can be used) for the duration of the COVID-19 declaration under Section 564(b)(1) of the Act, 21 U.S.C. section 360bbb-3(b)(1), unless the authorization is terminated or revoked.  Performed at [facility], [address] [place], [place] [address]   Urine Culture     Status: None   Collection Time: 08/05/20  3:30 PM   Specimen: Urine, Random  Result Value Ref Range Status   Specimen Description URINE, RANDOM  Final   Special Requests NONE  Final   Culture   Final    NO GROWTH Performed at [facility], [address] [place], [place] [address]    Report Status 08/06/2020 FINAL  Final         Radiology Studies: ECHOCARDIOGRAM COMPLETE  Result Date: 08/06/2020    ECHOCARDIOGRAM REPORT   Patient Name:   Roma SchanzJOHNNIE C Pellegrino Date of Exam: 08/06/2020 Medical Rec #:  [id number]          Height:       71.0 in Accession #:    [id number]         Weight:       170.0 lb Date of Birth:  Apr 10, 1946          BSA:          1.968 m Patient Age:    74 years           BP:           126/65 mmHg Patient Gender: M                  HR:           90 bpm. Exam Location:  Inpatient Procedure: 2D Echo, 3D Echo, Color Doppler and Cardiac Doppler Indications:    Fever  History:        Patient has prior history of Echocardiogram examinations, most                 recent 07/09/2020. Prior CABG, Arrythmias:Atrial Fibrillation;                 Risk Factors:Hypertension, Diabetes, Dyslipidemia and Sleep                 Apnea.  Sonographer:    Irving BurtonEmily Senior RDCS Referring Phys: [phone] BELKYS A REGALADO IMPRESSIONS  1. Probable LV apical  thrombus. 1.5 x 1.5 cm, with heterogeneous echo texture. Centrally more echo lucent.  2. Left ventricular ejection fraction, by estimation, is 45%. The left ventricle has mildly decreased function. The left ventricle demonstrates regional wall motion abnormalities, large basal inferior and inferoseptal aneurysm, which appears unchanged compared to intraoperative TEE. Left ventricular diastolic parameters are indeterminate.  3. Right ventricular systolic function is moderately reduced. The right ventricular size is normal. There is normal pulmonary artery systolic pressure. The estimated right ventricular systolic pressure is 25.3 mmHg.  4. The mitral valve is grossly normal. Mild mitral valve regurgitation.  5. The aortic valve is grossly normal. Aortic valve regurgitation is mild. No aortic stenosis is present.  6. Aortic dilatation noted. There is borderline dilatation of the ascending aorta, measuring 38 mm.  7. The inferior vena cava is normal in size with greater than 50% respiratory variability, suggesting right atrial pressure of 3 mmHg. Conclusion(s)/Recommendation(s): Critical findings reported to Dr. Jennette Dubinegalado/IM and acknowledged at 4:26 PM 08/06/20. FINDINGS  Left Ventricle: Left ventricular ejection fraction, by estimation, is 45%. The left ventricle has  mildly decreased function. The left ventricle demonstrates regional wall motion abnormalities. The left ventricular internal cavity size was normal in size. There is no left ventricular hypertrophy. Left ventricular diastolic parameters are indeterminate.  LV Wall Scoring: The basal inferolateral segment is aneurysmal. The mid inferolateral segment, apical septal segment, and apical inferior segment are akinetic. The apical lateral segment and apical anterior segment are hypokinetic. Right Ventricle: The right ventricular size is normal. No increase in right ventricular wall thickness. Right ventricular systolic function is moderately reduced. There is  normal pulmonary artery systolic pressure. The tricuspid regurgitant velocity is 2.36 m/s, and with an assumed right atrial pressure of 3 mmHg, the estimated right ventricular systolic pressure is 31.5 mmHg. Left Atrium: Left atrial size was normal in size. Right Atrium: Right atrial size was normal in size. Pericardium: Trivial pericardial effusion is present. Mitral Valve: The mitral valve is grossly normal. Mild mitral valve regurgitation. Tricuspid Valve: The tricuspid valve is normal in structure. Tricuspid valve regurgitation is trivial. Aortic Valve: The aortic valve is grossly normal. Aortic valve regurgitation is mild. Aortic regurgitation PHT measures 412 msec. No aortic stenosis is present. Pulmonic Valve: The pulmonic valve was grossly normal. Pulmonic valve regurgitation is trivial. Aorta: Aortic dilatation noted. There is borderline dilatation of the ascending aorta, measuring 38 mm. Venous: The inferior vena cava is normal in size with greater than 50% respiratory variability, suggesting right atrial pressure of 3 mmHg. IAS/Shunts: No atrial level shunt detected by color flow Doppler.  LEFT VENTRICLE PLAX 2D LVIDd:         4.90 cm  Diastology LVIDs:         3.90 cm  LV e' medial:    5.11 cm/s LV PW:         1.20 cm  LV E/e' medial:  15.8 LV IVS:        1.00 cm  LV e' lateral:   10.40 cm/s LVOT diam:     2.00 cm  LV E/e' lateral: 7.8 LV SV:         68 LV SV Index:   35 LVOT Area:     3.14 cm  RIGHT VENTRICLE RV S prime:     6.96 cm/s TAPSE (M-mode): 1.0 cm LEFT ATRIUM             Index       RIGHT ATRIUM           Index LA diam:        4.00 cm 2.03 cm/m  RA Area:     20.80 cm LA Vol (A2C):   39.3 ml 19.97 ml/m RA Volume:   53.60 ml  27.24 ml/m LA Vol (A4C):   63.4 ml 32.22 ml/m LA Biplane Vol: 50.8 ml 25.82 ml/m  AORTIC VALVE LVOT Vmax:   126.00 cm/s LVOT Vmean:  97.600 cm/s LVOT VTI:    0.218 m AI PHT:      412 msec  AORTA Ao Root diam: 3.50 cm Ao Asc diam:  3.80 cm MITRAL VALVE                TRICUSPID VALVE MV Area (PHT): 3.39 cm    TR Peak grad:   22.3 mmHg MV Decel Time: 224 msec    TR Vmax:        236.00 cm/s MV E velocity: 80.60 cm/s MV A velocity: 85.30 cm/s  SHUNTS MV E/A ratio:  0.94        Systemic VTI:  0.22 m  Systemic Diam: 2.00 cm Cherlynn Kaiser MD Electronically signed by Cherlynn Kaiser MD Signature Date/Time: 08/06/2020/4:34:29 PM    Final         Scheduled Meds:  aspirin  81 mg Oral Daily   Chlorhexidine Gluconate Cloth  6 each Topical Daily   fenofibrate  160 mg Oral Daily   insulin aspart  0-15 Units Subcutaneous TID AC & HS   insulin glargine  15 Units Subcutaneous Daily   metoprolol succinate  75 mg Oral Daily   rosuvastatin  20 mg Oral Daily   tamsulosin  0.4 mg Oral Daily   Continuous Infusions:  cefTRIAXone (ROCEPHIN)  IV Stopped (08/08/20 1215)   heparin 1,900 Units/hr (08/08/20 1259)     LOS: 4 days    Time spent: 35 minutes.   Domenic Polite, MD [facility]  08/08/2020, 2:20 PM

## 2020-08-08 NOTE — Progress Notes (Addendum)
--See ADDENDUM below  [place] for Heparin Indication: LV thrombus  Allergies  Allergen Reactions  . Lipitor [Atorvastatin] Other (See Comments)    Muscle pain    Patient Measurements: Height: 5\' 11"  (180.3 cm) Weight: 81 kg (178 lb 9.2 oz) IBW/kg (Calculated) : 75.3 Heparin Dosing Weight:  77.1 kg  Vital Signs: Temp: 98.9 F (37.2 C) (01/19 0944) Temp Source: Oral (01/19 0944) BP: 147/75 (01/19 0944) Pulse Rate: 95 (01/19 0944)  Labs: Recent Labs    08/06/20 0304 08/06/20 0304 08/07/20 0249 08/07/20 0912 08/07/20 2041 08/08/20 0303 08/08/20 1352  HGB 8.5*  --  8.3*  --   --  8.3*  --   HCT 26.6*  --  25.9*  --   --  26.7*  --   PLT 442*  --  425*  --   --  433*  --   HEPARINUNFRC  --    < > <0.10* 0.20* <0.10* <0.10* <0.10*  CREATININE 1.63*  --   --  1.36*  --   --   --    < > = values in this interval not displayed.    Estimated Creatinine Clearance: 50 mL/min (A) (by C-G formula based on SCr of 1.36 mg/dL (H)).   Medical History: Past Medical History:  Diagnosis Date  . Arthritis   . 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 Cancer (HCC)    Skin  . Cerebral artery disease   . Coronary artery disease   . COVID-07 Dec 2019  . COVID-19 virus infection 12/02/2019  . Depression   . Essential hypertension   . Hernia, umbilical   . HOH (hard of hearing)   . Hyperlipidemia   . NSTEMI (non-ST elevated myocardial infarction) ([place])   . OSA on CPAP   . Pulmonary emboli [facility])    May 2021  . Type 2 diabetes mellitus (HCC)    Assessment: CC/HPI: stomach pressure, N/V, abdominal pain  PMH: HTN, HLD, T2DM, CAD, hx CVA and PE 5/21, hx urgent CABG x4 in 06/2020, CHF, skin ca, COVID 5/21, HOH, OSA, Afib. CKD  Anticoag: ?Eliquis PTA (LF 06/08/20, INR only 1.3). H/o afib and CVA and PE 5/21. 1/17 Doppler negative for DVT. - 1/17 Echo: LV Apical thrombus. Start IV heparin 1/17. Hgb 8.4. Plts 442.  1/19 afternoon update  : 8 hour heparin level check: <0.1 on heparin drip 1900 units/hr. Heparin level remains low despite increased heparin rate. No infusion issues reported.  Will follow up with RN when RN available.   Will check antithrombin III level, since heparin levels have been <0.1 x3  on heparin infusion.   Pharmacy consulted to start Warfarin today.   Baseline INR 1.3,  potential DDI with fenofibrate can increase hypothrombinemic effect of warfarin.   AST slightly up 46,  ALT wnl on 1/15 CKD : SCr down to 1.36    Goal of Therapy:  Heparin level 0.3-0.7 units/ml Monitor platelets by anticoagulation protocol: Yes   Plan:  Heparin 3000 units bolus IV x1 Increase heparin to 2100 units/hr Check 8 hour heparin level and antithrombin III level  Give Warfarin 6 mg x1 today Daily INR, HL and CBC  Nicole Cella, RPh Clinical Pharmacist Please check AMION for all [place] phone numbers After 10:00 PM, call [place] [phone]   08/08/2020 3:07 PM   ADDENDUM:   Discussed with Dr. Broadus John to considert switch from IV heparin to SQ Lovenox bridge to Warfarin,  due to thie issue of undetectable Heparin levels x 3, despite higher heparin infusion rates now increased to 27 units/kg/hr. Each time RN has reported no issues with the heparin infusion and good IV site , no infiltration note.   Dr. Broadus John ordered to switch from Heparin IV drip to SQ Lovenox.   CKD , SCr down to 1. CBC:  Hgb 8.3>8.3 , pltc 400s stable.  No bleeding noted, confirmed per RN>    PLAN:  Discontinue IV heparin drip at 17:00, and 1 hour later start Lovenox 1mg /kg q12h = 80 mg SQ q12 hours.  Will monitor CBC every 48-72 hours Watch renal function and adjust lovenox dose as needed. Follow up INR in AM  Thank you for allowing pharmacy to be part of this patients care team.  Nicole Cella, RPh Clinical Pharmacist Please check AMION for all [place] phone numbers After 10:00 PM, call [name]

## 2020-08-08 NOTE — Progress Notes (Signed)
PT Cancellation Note  Patient Details Name: Patrick Miranda MRN: [id number] DOB: [birth year]   Cancelled Treatment:    Reason Eval/Treat Not Completed: Medical issues which prohibited therapy patient with new LV thrombus, and heparin levels not quite in therapeutic window. Will follow and attempt to return later if he is medically ready and if time/schedule allow.    Windell Norfolk, DPT, PN1   Supplemental Physical Therapist [facility]    Pager [phone] Acute Rehab Office [phone]

## 2020-08-08 NOTE — Progress Notes (Signed)
[place] for Heparin Indication: LV thrombus  Allergies  Allergen Reactions  . Lipitor [Atorvastatin] Other (See Comments)    Muscle pain    Patient Measurements: Height: 5\' 11"  (180.3 cm) Weight: 81 kg (178 lb 9.2 oz) IBW/kg (Calculated) : 75.3 Heparin Dosing Weight:  77.1 kg  Vital Signs: Temp: 99 F (37.2 C) (01/19 0433) Temp Source: Oral (01/19 0433) BP: 141/78 (01/19 0433) Pulse Rate: 94 (01/19 0433)  Labs: Recent Labs    08/06/20 0304 08/06/20 0304 08/07/20 0249 08/07/20 0912 08/07/20 2041 08/08/20 0303  HGB 8.5*  --  8.3*  --   --  8.3*  HCT 26.6*  --  25.9*  --   --  26.7*  PLT 442*  --  425*  --   --  433*  HEPARINUNFRC  --    < > <0.10* 0.20* <0.10* <0.10*  CREATININE 1.63*  --   --  1.36*  --   --    < > = values in this interval not displayed.    Estimated Creatinine Clearance: 50 mL/min (A) (by C-G formula based on SCr of 1.36 mg/dL (H)).   Medical History: Past Medical History:  Diagnosis Date  . Arthritis   . 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 Cancer (HCC)    Skin  . Cerebral artery disease   . Coronary artery disease   . COVID-07 Dec 2019  . COVID-19 virus infection 12/02/2019  . Depression   . Essential hypertension   . Hernia, umbilical   . HOH (hard of hearing)   . Hyperlipidemia   . NSTEMI (non-ST elevated myocardial infarction) ([place])   . OSA on CPAP   . Pulmonary emboli [facility])    May 2021  . Type 2 diabetes mellitus (HCC)    Assessment: CC/HPI: stomach pressure, N/V, abdominal pain  PMH: HTN, HLD, T2DM, CAD, hx CVA and PE 5/21, hx urgent CABG x4 in 06/2020, CHF, skin ca, COVID 5/21, HOH, OSA, Afib. CKD  Anticoag: ?Eliquis PTA (LF 06/08/20, INR only 1.3). H/o afib and CVA and PE 5/21. 1/17 Doppler negative for DVT. - 1/17 Echo: LV Apical thrombus. Start IV heparin 1/17. Hgb 8.4. Plts 442.  1/19 AM update:  Heparin level remains low No infusion issues per RN  Goal of Therapy:   Heparin level 0.3-0.7 units/ml Monitor platelets by anticoagulation protocol: Yes   Plan:  Heparin 1500 units bolus Inc heparin to 1900 units/hr 1400 heparin level  Narda Bonds, PharmD, BCPS Clinical Pharmacist Phone: [phone]

## 2020-08-08 NOTE — TOC Initial Note (Addendum)
Transition of Care [facility]) - Initial/Assessment Note    Patient Details  Name: Patrick Miranda MRN: [id number] Date of Birth: 1946-01-28  Transition of Care [facility]) CM/SW Contact:    Carles Collet, RN Phone Number: 08/08/2020, 10:23 AM  Clinical Narrative:                 Spoke w patient and wife at bedside.  Plan is to return home.  Patient has RW, 3/1, shower at home. We discussed [place] services. They had [facility] in May of 2021 and would like to use them again for services.  Referral given to [facility], awaiting callback to see if they can accept for services. [facility] [place] pending therapeutic INR on coumadin.    Expected Discharge Plan: [place] Barriers to Discharge: Continued Medical Work up   Patient Goals and CMS Choice Patient states their goals for this hospitalization and ongoing recovery are:: return home CMS Medicare.gov Compare Post Acute Care list provided to:: Patient Choice offered to / list presented to : [facility]  Expected Discharge Plan and Services Expected Discharge Plan: [place]   Discharge Planning Services: CM Consult Post Acute Care Choice: [place] arrangements for the past 2 months: [place]                 DME Arranged: N/A         HH Arranged: RN,PT [place] Agency: [place] ([place]) Date HH Agency Contacted: 08/08/20 Time HH Agency Contacted: 5 Representative spoke with at [place]: [place] Arrangements/Services Living arrangements for the past 2 months: [place] Lives with:: Spouse              Current home services: DME    Activities of Daily Living Home Assistive Devices/Equipment: CPAP,Eyeglasses ADL Screening (condition at time of admission) Patient's cognitive ability adequate to safely complete daily activities?: Yes Is the patient deaf or have difficulty hearing?: Yes Does the patient  have difficulty seeing, even when wearing glasses/contacts?: No Does the patient have difficulty concentrating, remembering, or making decisions?: No Patient able to express need for assistance with ADLs?: Yes Does the patient have difficulty dressing or bathing?: No Independently performs ADLs?: Yes (appropriate for developmental age) Does the patient have difficulty walking or climbing stairs?: Yes Weakness of Legs: Both Weakness of Arms/Hands: None  Permission Sought/Granted                  Emotional Assessment              Admission diagnosis:  Ventral hernia without obstruction or gangrene [K43.9] Lower abdominal pain [S50.53] Periumbilical pain [Z76.73] Abdominal pain [R10.9] Sepsis without acute organ dysfunction, due to unspecified organism [facility]) [A41.9] Patient Active Problem List   Diagnosis Date Noted  . Abdominal pain 08/03/2020  . Chronic kidney disease, stage 3a ([place]) 08/03/2020  . Mixed diabetic hyperlipidemia associated with type 2 diabetes mellitus ([place]) 08/03/2020  . Uncontrolled type 2 diabetes mellitus with hyperglycemia, with long-term current use of insulin ([place]) 08/03/2020  . Foodborne gastroenteritis 08/03/2020  . SIRS (systemic inflammatory response syndrome) ([place]) 08/03/2020  . Postop check 07/25/2020  . Hx of CABG 07/09/2020  . Coronary artery disease involving native heart 07/04/2020  . Abnormal nuclear stress test   . Multiple subsegmental pulmonary emboli without acute cor pulmonale (HCC)   . AF (paroxysmal atrial fibrillation) ([place])   . Hypersomnia 08/30/2019  .  History of adenomatous polyp of colon 05/06/2018  . Vasomotor rhinitis 07/18/2017  . Arthritis of knee, right 01/06/2014  . Brainstem stroke ([place]) 09/07/2011    Class: Acute  . Dysarthria 09/05/2011    Class: Acute  . Essential hypertension 09/05/2011    Class: Chronic  . OSA on CPAP 09/05/2011    Class: Chronic  . Anemia 09/05/2011    Class: Chronic   PCP:  Burnard Bunting, MD Pharmacy:   [place], [place] [address] Phone: [phone] Fax: [phone]  [facility] [address] [place] [address] Phone: [place], [place] [address] [place] [address] Phone: [phone] Fax: [phone]  [facility] [place], [place] - [address] [place] [address] Phone: [phone] Fax: [phone]  [facility] [address] [place] - [place] [address] [place] [address] [place] [address] Phone: [phone] Fax: [phone]  [facility] [phone] - [place], [place] [address] [place] [address] [place] [address] Phone: [phone] Fax: [phone]     Social Determinants of Health (SDOH) Interventions    Readmission Risk Interventions No flowsheet data found.

## 2020-08-09 DIAGNOSIS — N1831 Chronic kidney disease, stage 3a: Secondary | ICD-10-CM | POA: Diagnosis not present

## 2020-08-09 DIAGNOSIS — I10 Essential (primary) hypertension: Secondary | ICD-10-CM | POA: Diagnosis not present

## 2020-08-09 DIAGNOSIS — R109 Unspecified abdominal pain: Secondary | ICD-10-CM | POA: Diagnosis not present

## 2020-08-09 LAB — BASIC METABOLIC PANEL
Anion gap: 10 (ref 5–15)
BUN: 14 mg/dL (ref 8–23)
CO2: 21 mmol/L — ABNORMAL LOW (ref 22–32)
Calcium: 7.9 mg/dL — ABNORMAL LOW (ref 8.9–10.3)
Chloride: 105 mmol/L (ref 98–111)
Creatinine, Ser: 1.08 mg/dL (ref 0.61–1.24)
GFR, Estimated: >60 mL/min (ref >60)
Glucose, Bld: 126 mg/dL — ABNORMAL HIGH (ref 70–99)
Potassium: 3.8 mmol/L (ref 3.5–5.1)
Sodium: 136 mmol/L (ref 135–145)

## 2020-08-09 LAB — ANTITHROMBIN III: AntiThromb III Func: 70 % — ABNORMAL LOW (ref 75–120)

## 2020-08-09 LAB — CBC
HCT: 28.1 % — ABNORMAL LOW (ref 39.0–52.0)
Hemoglobin: 9.2 g/dL — ABNORMAL LOW (ref 13.0–17.0)
MCH: 28.7 pg (ref 26.0–34.0)
MCHC: 32.7 g/dL (ref 30.0–36.0)
MCV: 87.5 fL (ref 80.0–100.0)
Platelets: 309 10*3/uL (ref 150–400)
RBC: 3.21 MIL/uL — ABNORMAL LOW (ref 4.22–5.81)
RDW: 15.9 % — ABNORMAL HIGH (ref 11.5–15.5)
WBC: 8.5 10*3/uL (ref 4.0–10.5)
nRBC: 0 % (ref 0.0–0.2)

## 2020-08-09 LAB — PROTIME-INR
INR: 1.4 — ABNORMAL HIGH (ref 0.8–1.2)
Prothrombin Time: 16.8 seconds — ABNORMAL HIGH (ref 11.4–15.2)

## 2020-08-09 LAB — GLUCOSE, CAPILLARY
Glucose-Capillary: 176 mg/dL — ABNORMAL HIGH (ref 70–99)
Glucose-Capillary: 188 mg/dL — ABNORMAL HIGH (ref 70–99)
Glucose-Capillary: 190 mg/dL — ABNORMAL HIGH (ref 70–99)
Glucose-Capillary: 186 mg/dL — ABNORMAL HIGH (ref 70–99)

## 2020-08-09 LAB — MAGNESIUM: Magnesium: 1.9 mg/dL (ref 1.7–2.4)

## 2020-08-09 MED ORDER — POLYETHYLENE GLYCOL 3350 17 G PO PACK
17.0000 g | PACK | Freq: Two times a day (BID) | ORAL | Status: DC
  Administered 2020-08-09 – 2020-08-10 (×3): 17 g via ORAL
  Filled 2020-08-09 (×3): qty 1

## 2020-08-09 MED ORDER — WARFARIN SODIUM 6 MG PO TABS
6.0000 mg | ORAL_TABLET | Freq: Once | ORAL | Status: AC
  Administered 2020-08-09: 6 mg via ORAL
  Filled 2020-08-09: qty 1

## 2020-08-09 NOTE — Progress Notes (Signed)
Physical Therapy Treatment Patient Details Name: Patrick Miranda MRN: [id number] DOB: 08/29/1945 Today's Date: 08/09/2020    History of Present Illness 75yo male presenting to [facility] c/o nausea and vomiting as well as abdominal pain. Admitted for work up. PMH hx brain stem CVA, skin CA, covid-19, HTN, HOH, HLD, NSTEMI, hx PE, DM, CABG 06/2020, TKA    PT Comments    Patient received in recliner, spouse present and very supportive, reports she has already taken him walking multiple rounds in the hallway today. Verbally reviewed and demonstrated sternal precautions- spouse generally remembered them, however patient continues to not remember what they are and needed cues through session. Able to improve and progress mobility significantly today and patient/spouse feel he is very close to his baseline at this point. Had one stumble in the hallway but able to recover well and they report this happens often at home. Left up in recliner with all needs met, spouse present. Doing well enough that he is really more appropriate for OP PT at this point. Family very active in walking him in the hallway and he seems at his baseline- will sign off for now, but please reconsult if he has acute changes in mobility!      Follow Up Recommendations  Outpatient PT     Equipment Recommendations  Rolling walker with 5" wheels;3in1 (PT)    Recommendations for Other Services       Precautions / Restrictions Precautions Precautions: Fall;Sternal Precaution Booklet Issued: No Precaution Comments: sternal precautions- verbally reviewed (CABG done December 2021) Restrictions Weight Bearing Restrictions: No Other Position/Activity Restrictions: sternal precautions    Mobility  Bed Mobility               General bed mobility comments: OOB in recliner  Transfers Overall transfer level: Needs assistance Equipment used: None Transfers: Sit to/from Stand Sit to Stand: Min guard         General  transfer comment: min guard for steadying initially; did need to use rocking and multiple attempts when sternal precautions were enforced  Ambulation/Gait Ambulation/Gait assistance: Min guard Gait Distance (Feet): 150 Feet Assistive device: None Gait Pattern/deviations: Step-to pattern;Decreased step length - right;Decreased step length - left;Decreased stride length;Trunk flexed Gait velocity: decreased   General Gait Details: gait pattern improved today, able to mobilize with light min guard without significant gait impairment. Did have one mild LOB when turning but able to recover and he and his spouse report this is baseline   Marine scientist [place] (Stroke Patients Only)       Balance Overall balance assessment: Needs assistance Sitting-balance support: No upper extremity supported;Feet supported Sitting balance-Leahy Scale: Good     Standing balance support: Single extremity supported;No upper extremity supported;During functional activity Standing balance-Leahy Scale: Good Standing balance comment: able to maintain dynamic balance and make fast turns with light min guard without difficulty. Did have one stumble but able to recover without assist and he/family report this is baseline                            Cognition Arousal/Alertness: Awake/alert Behavior During Therapy: WFL for tasks assessed/performed Overall Cognitive Status: Within Functional Limits for tasks assessed  General Comments: possibly some STM deficits- could not remember sternal precautions      Exercises      General Comments        Pertinent Vitals/Pain Pain Assessment: No/denies pain Pain Score: 0-No pain Pain Intervention(s): Limited activity within patient's tolerance;Monitored during session    Home Living                      Prior Function            PT Goals (current  goals can now be found in the care plan section) Acute Rehab PT Goals Patient Stated Goal: to go home, decrease abdominal pain PT Goal Formulation: With patient Time For Goal Achievement: 08/20/20 Potential to Achieve Goals: Good Progress towards PT goals: Progressing toward goals    Frequency    Min 3X/week      PT Plan Discharge plan needs to be updated    Co-evaluation              AM-PAC PT "6 Clicks" Mobility   Outcome Measure  Help needed turning from your back to your side while in a flat bed without using bedrails?: A Little Help needed moving from lying on your back to sitting on the side of a flat bed without using bedrails?: A Little Help needed moving to and from a bed to a chair (including a wheelchair)?: None Help needed standing up from a chair using your arms (e.g., wheelchair or bedside chair)?: None Help needed to walk in hospital room?: A Little Help needed climbing 3-5 steps with a railing? : A Little 6 Click Score: 20    End of Session Equipment Utilized During Treatment: Gait belt Activity Tolerance: Patient tolerated treatment well Patient left: in chair;with call bell/phone within reach;with family/visitor present Nurse Communication: Mobility status PT Visit Diagnosis: Unsteadiness on feet (R26.81);Muscle weakness (generalized) (M62.81)     Time: 1425-1440 PT Time Calculation (min) (ACUTE ONLY): 15 min  Charges:  $Gait Training: 8-22 mins                     Windell Norfolk, DPT, PN1   Supplemental Physical Therapist [place]    Pager [phone] Acute Rehab Office [phone]

## 2020-08-09 NOTE — Progress Notes (Signed)
--See ADDENDUM below  [place] for Heparin Indication: LV thrombus  Allergies  Allergen Reactions  . Lipitor [Atorvastatin] Other (See Comments)    Muscle pain    Patient Measurements: Height: 5\' 11"  (180.3 cm) Weight: 81 kg (178 lb 9.2 oz) IBW/kg (Calculated) : 75.3 Heparin Dosing Weight:  77.1 kg  Vital Signs: Temp: 98.7 F (37.1 C) (01/20 0958) Temp Source: Oral (01/20 0958) BP: 137/70 (01/20 0958) Pulse Rate: 98 (01/20 0958)  Labs: Recent Labs    08/07/20 0249 08/07/20 0912 08/07/20 2041 08/08/20 0303 08/08/20 1352 08/09/20 0736  HGB 8.3*  --   --  8.3*  --  9.2*  HCT 25.9*  --   --  26.7*  --  28.1*  PLT 425*  --   --  433*  --  309  LABPROT  --   --   --   --   --  16.8*  INR  --   --   --   --   --  1.4*  HEPARINUNFRC <0.10* 0.20* <0.10* <0.10* <0.10*  --   CREATININE  --  1.36*  --   --   --  1.08    Estimated Creatinine Clearance: 62.9 mL/min (by C-G formula based on SCr of 1.08 mg/dL).   Medical History: Past Medical History:  Diagnosis Date  . Arthritis   . 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 Cancer (HCC)    Skin  . Cerebral artery disease   . Coronary artery disease   . COVID-07 Dec 2019  . COVID-19 virus infection 12/02/2019  . Depression   . Essential hypertension   . Hernia, umbilical   . HOH (hard of hearing)   . Hyperlipidemia   . NSTEMI (non-ST elevated myocardial infarction) ([place])   . OSA on CPAP   . Pulmonary emboli [facility])    May 2021  . Type 2 diabetes mellitus (HCC)    Assessment: CC/HPI: stomach pressure, N/V, abdominal pain  PMH: HTN, HLD, T2DM, CAD, hx CVA and PE 5/21, hx urgent CABG x4 in 06/2020, CHF, skin ca, COVID 5/21, HOH, OSA, Afib. CKD  Anticoag: ?Eliquis PTA (LF 06/08/20, INR only 1.3). H/o afib and CVA and PE 5/21. 1/17 Doppler negative for DVT.  1/17 Echo: LV Apical thrombus.   After discussion with Dr. Broadus John  On 1/19 we have stopped IV heparin, switched to  Lovenox due to undetectable Heparin levels x3 on high heparin drip rates.  Warfarin started 08/08/20.  Hgb 8.3>8.3>9.2 , pltc 400s >309 stable. No bleeding noted.  1/20 antithrombin III level = 70 low (75-120%) Baseline INR 1.3,  potential DDI with fenofibrate may increase warfarin hypothrombinemic effect.  INR 1.4 today  CKD ,SCr trending down to 1.36 >1.08, CrCl 62 ml/min Recent CABG 07/09/20   Goal of Therapy:  Heparin level 0.3-0.7 units/ml Monitor platelets by anticoagulation protocol: Yes   Plan:  Lovenox 80 mg sq q12hour Give Warfarin 6 mg x1 today  (potential drug interaction with fenofibrate)  Daily INR, HL and CBC  Nicole Cella, RPh Clinical Pharmacist Please check AMION for all [place] phone numbers After 10:00 PM, call [place]  08/09/2020 12:20 PM   ADDENDUM:   Discussed with Dr. Broadus John to considert switch from IV heparin to SQ Lovenox bridge to Warfarin,  due to thie issue of undetectable Heparin levels x 3, despite higher heparin infusion rates now increased to 27 units/kg/hr. Each time RN has reported  no issues with the heparin infusion and good IV site , no infiltration note.   Dr. Broadus John ordered to switch from Heparin IV drip to SQ Lovenox.   CKD , SCr down to 1. CBC:  Hgb 8.3>8.3 , pltc 400s stable.  No bleeding noted, confirmed per RN>    PLAN:  Discontinue IV heparin drip at 17:00, and 1 hour later start Lovenox 1mg /kg q12h = 80 mg SQ q12 hours.  Will monitor CBC every 48-72 hours Watch renal function and adjust lovenox dose as needed. Follow up INR in AM  Thank you for allowing pharmacy to be part of this patients care team.  Nicole Cella, RPh Clinical Pharmacist Please check AMION for all [place] phone numbers After 10:00 PM, call [place]

## 2020-08-09 NOTE — Progress Notes (Signed)
PROGRESS NOTE    Patrick Miranda  [id number] DOB: [birth year] DOA: 08/03/2020 PCP: Burnard Bunting, MD   Brief Narrative: 74/M w medical history significant for CAD/recent CABG 06/2020, obstructive sleep apnea, paroxysmal A. fib, insulin-dependent diabetes type 2, hypertension, chronic kidney disease stage IIIa who presented to [facility] complaining of nausea vomiting and abdominal pain. -morning of admission after eating breakfast fried egg, 28 days old rice and refrigerated previously opened can of peaches he began to experience severe abdominal pain.  Symptoms accompanied by multiple episodes of vomiting.  Symptoms persisted and eventually patient presented to [facility]. -Work-up in the ED noted mild leukocytosis, CT without IV contrast noted moderate thickening involving multiple distal small bowel loops,consistent with enteritis and associated mesenteric inflammatory changes and tiny amount of free fluid.   Assessment & Plan:  Abdominal Pain, Nausea, Vomiting:  -Possibilities are gastroenteritis, versus mesenteric ischemia secondary to LV thrombus, CT without IV contrast showed small bowel thickening , KUB suggestive of ileus -Seen by general surgery in consultation, recommended bowel rest and supportive care -Slowly improving, continue laxatives, advance diet today -Also had a low-grade fever in the setting, on day five of ceftriaxone, continue this for 1-2 more days  LV thrombus -Recent CABG, echo on 1/17 noted EF of 45% with large basal inferior and inferior septal aneurysm and probable LV thrombus measuring 1.5X 1.5 cm -Currently on full dose Lovenox, started Coumadin 1/19 -Lovenox teaching  Ischemic cardiomyopathy, EF 45% -Clinically appears euvolemic, continue Toprol  Paroxysmal atrial fibrillation -Continue Toprol, Lovenox bridge, started Coumadin 1/19  CKD 3A -Creatinine stable at baseline, monitor  Urine retention;  -Started on Flomax.   -Foley catheter removed yesterday, is voiding better but still with some residual, monitor, will need close urology follow-up  CAD S/P CABG;  -recent CABG 06/2020. Follow up with Dr Lucianne Lei trigt, CVTS was notified by [facility] of admission  Diabetes type 2;  -CBGs are stable, continue Lantus and sliding scale insulin  Thrombocytosis;  -Continue with aspirin.  -Likely reactive, improving  Estimated body mass index is 24.91 kg/m as calculated from the following:   Height as of this encounter: 5\' 11"  (1.803 m).   Weight as of this encounter: 81 kg.   DVT prophylaxis: Heparin, start Coumadin Code Status: Full code Family Communication: Wife at bedside Disposition Plan:  Status is: inpatient.  Remains inpatient appropriate due to severity of illness  Dispo: The patient is from: Home              Anticipated d/c is to: Home              Anticipated d/c date is: 1-2 days              Patient currently is not medically stable to d/c.  Consultants:   Surgery, cardiology  Procedures:   none  Antimicrobials:    Subjective: -Reports having mild lower abdominal discomfort, no nausea or vomiting, tolerating diet, ambulating in the halls, urinating after Foley catheter removal   Objective: Vitals:   08/08/20 0944 08/08/20 1658 08/08/20 1951 08/09/20 0958  BP: (!) 147/75 139/70 (!) 149/71 137/70  Pulse: 95 91 95 98  Resp: 18 18 18 16   Temp: 98.9 F (37.2 C) 100.2 F (37.9 C) 99.7 F (37.6 C) 98.7 F (37.1 C)  TempSrc: Oral Oral Oral Oral  SpO2: 96% 97%  95%  Weight:      Height:        Intake/Output Summary (Last 24 hours)  at 08/09/2020 1138 Last data filed at 08/09/2020 1046 Gross per 24 hour  Intake 1564.59 ml  Output 1875 ml  Net -310.41 ml   Filed Weights   08/06/20 1115 08/07/20 2048  Weight: 77.1 kg 81 kg    Examination:  General exam: Elderly pleasant male sitting up in bed, AAOx3, no distress CVS: S1-S2, regular rate rhythm Lungs: Decreased breath  sounds at the bases Abdomen: Soft, mildly distended, nontender, bowel sounds diminished but present, distended bladder noted Extremities: No edema  GU: Foley catheter noted Skin: No rashes on exposed skin    Data Reviewed: I have personally reviewed following labs and imaging studies  CBC: Recent Labs  Lab 08/03/20 1756 08/04/20 0237 08/05/20 0331 08/06/20 0304 08/07/20 0249 08/08/20 0303 08/09/20 0736  WBC 11.5* 10.6* 11.4* 9.3 9.7 7.5 8.5  NEUTROABS 10.2* 8.9*  --   --   --   --   --   HGB 10.0* 9.2* 8.9* 8.5* 8.3* 8.3* 9.2*  HCT 31.4* 28.7* 28.9* 26.6* 25.9* 26.7* 28.1*  MCV 91.8 91.7 92.0 89.9 89.0 89.3 87.5  PLT 636* 539* 450* 442* 425* 433* [id number]   Basic Metabolic Panel: Recent Labs  Lab 08/04/20 0237 08/05/20 0331 08/06/20 0304 08/07/20 0249 08/07/20 0912 08/08/20 0303 08/09/20 0736  NA 138 136 136  --  133*  --  136  K 4.1 3.6 3.6  --  3.6  --  3.8  CL 105 103 103  --  102  --  105  CO2 19* 21* 23  --  17*  --  21*  GLUCOSE 168* 127* 120*  --  209*  --  126*  BUN 27* 31* 26*  --  19  --  14  CREATININE 1.58* 1.64* 1.63*  --  1.36*  --  1.08  CALCIUM 8.4* 7.5* 7.6*  --  7.5*  --  7.9*  MG 1.7  --  2.1 2.2  --  2.0 1.9  PHOS 4.6  --   --   --   --   --   --    GFR: Estimated Creatinine Clearance: 62.9 mL/min (by C-G formula based on SCr of 1.08 mg/dL). Liver Function Tests: Recent Labs  Lab 08/03/20 1756 08/04/20 0237  AST 62* 46*  ALT 19 17  ALKPHOS 83 65  BILITOT 1.2 1.0  PROT 6.0* 5.8*  ALBUMIN 3.0* 2.7*   No results for input(s): LIPASE, AMYLASE in the last 168 hours. No results for input(s): AMMONIA in the last 168 hours. Coagulation Profile: Recent Labs  Lab 08/03/20 1756 08/09/20 0736  INR 1.3* 1.4*   Cardiac Enzymes: No results for input(s): CKTOTAL, CKMB, CKMBINDEX, TROPONINI in the last 168 hours. BNP (last 3 results) No results for input(s): PROBNP in the last 8760 hours. HbA1C: No results for input(s): HGBA1C in the last 72  hours. CBG: Recent Labs  Lab 08/08/20 0643 08/08/20 1156 08/08/20 1656 08/08/20 2022 08/09/20 0641  GLUCAP 131* 162* 147* 140* 176*   Lipid Profile: No results for input(s): CHOL, HDL, LDLCALC, TRIG, CHOLHDL, LDLDIRECT in the last 72 hours. Thyroid Function Tests: No results for input(s): TSH, T4TOTAL, FREET4, T3FREE, THYROIDAB in the last 72 hours. Anemia Panel: No results for input(s): VITAMINB12, FOLATE, FERRITIN, TIBC, IRON, RETICCTPCT in the last 72 hours. Sepsis Labs: Recent Labs  Lab 08/03/20 1756 08/04/20 0237  LATICACIDVEN 1.8 1.4    Recent Results (from the past 240 hour(s))  Blood Culture (routine x 2)     Status: None  Collection Time: 08/03/20  5:09 PM   Specimen: BLOOD  Result Value Ref Range Status   Specimen Description BLOOD SITE NOT SPECIFIED  Final   Special Requests   Final    BOTTLES DRAWN AEROBIC AND ANAEROBIC Blood Culture adequate volume   Culture   Final    NO GROWTH 5 DAYS Performed at [facility], [address], [place], [place] [address]    Report Status 08/08/2020 FINAL  Final  Blood Culture (routine x 2)     Status: None   Collection Time: 08/03/20  5:57 PM   Specimen: BLOOD  Result Value Ref Range Status   Specimen Description BLOOD SITE NOT SPECIFIED  Final   Special Requests   Final    BOTTLES DRAWN AEROBIC AND ANAEROBIC Blood Culture adequate volume   Culture   Final    NO GROWTH 5 DAYS Performed at [facility], [place] [address], [place], [place] [address]    Report Status 08/08/2020 FINAL  Final  Urine culture     Status: Abnormal   Collection Time: 08/03/20  7:14 PM   Specimen: In/Out Cath Urine  Result Value Ref Range Status   Specimen Description IN/OUT CATH URINE  Final   Special Requests   Final    NONE Performed at [facility], [place] [address], [place], [place] [address]    Culture MULTIPLE SPECIES PRESENT, SUGGEST RECOLLECTION (A)  Final   Report Status 08/05/2020 FINAL  Final  Resp Panel by  RT-PCR (Flu A&B, Covid)     Status: None   Collection Time: 08/03/20  7:14 PM   Specimen: Nasopharyngeal(NP) swabs in vial transport medium  Result Value Ref Range Status   SARS Coronavirus 2 by RT PCR NEGATIVE NEGATIVE Final    Comment: (NOTE) SARS-CoV-2 target nucleic acids are NOT DETECTED.  The SARS-CoV-2 RNA is generally detectable in upper respiratory specimens during the acute phase of infection. The lowest concentration of SARS-CoV-2 viral copies this assay can detect is 138 copies/mL. A negative result does not preclude SARS-Cov-2 infection and should not be used as the sole basis for treatment or other patient management decisions. A negative result may occur with  improper specimen collection/handling, submission of specimen other than nasopharyngeal swab, presence of viral mutation(s) within the areas targeted by this assay, and inadequate number of viral copies(<138 copies/mL). A negative result must be combined with clinical observations, patient history, and epidemiological information. The expected result is Negative.  Fact Sheet for Patients:  EntrepreneurPulse.com.au  Fact Sheet for Healthcare Providers:  IncredibleEmployment.be  This test is no t yet approved or cleared by the [place] FDA and  has been authorized for detection and/or diagnosis of SARS-CoV-2 by FDA under an Emergency Use Authorization (EUA). This EUA will remain  in effect (meaning this test can be used) for the duration of the COVID-19 declaration under Section 564(b)(1) of the Act, 21 U.S.C.section 360bbb-3(b)(1), unless the authorization is terminated  or revoked sooner.       Influenza A by PCR NEGATIVE NEGATIVE Final   Influenza B by PCR NEGATIVE NEGATIVE Final    Comment: (NOTE) The Xpert Xpress SARS-CoV-2/FLU/RSV plus assay is intended as an aid in the diagnosis of influenza from Nasopharyngeal swab specimens and should not be used as a sole  basis for treatment. Nasal washings and aspirates are unacceptable for Xpert Xpress SARS-CoV-2/FLU/RSV testing.  Fact Sheet for Patients: EntrepreneurPulse.com.au  Fact Sheet for Healthcare Providers: IncredibleEmployment.be  This test is  not yet approved or cleared by the [place] and has been authorized for detection and/or diagnosis of SARS-CoV-2 by FDA under an Emergency Use Authorization (EUA). This EUA will remain in effect (meaning this test can be used) for the duration of the COVID-19 declaration under Section 564(b)(1) of the Act, 21 U.S.C. section 360bbb-3(b)(1), unless the authorization is terminated or revoked.  Performed at [facility], [place] [address], [place], [place] [address]   Urine Culture     Status: None   Collection Time: 08/05/20  3:30 PM   Specimen: Urine, Random  Result Value Ref Range Status   Specimen Description URINE, RANDOM  Final   Special Requests NONE  Final   Culture   Final    NO GROWTH Performed at [facility], [place] [address] [place],  [address]    Report Status 08/06/2020 FINAL  Final     Scheduled Meds: . aspirin  81 mg Oral Daily  . Chlorhexidine Gluconate Cloth  6 each Topical Daily  . enoxaparin (LOVENOX) injection  1 mg/kg Subcutaneous Q12H  . fenofibrate  160 mg Oral Daily  . insulin aspart  0-15 Units Subcutaneous TID AC & HS  . insulin glargine  15 Units Subcutaneous Daily  . metoprolol succinate  75 mg Oral Daily  . polyethylene glycol  17 g Oral BID  . rosuvastatin  20 mg Oral Daily  . senna-docusate  1 tablet Oral BID  . tamsulosin  0.4 mg Oral Daily  . Warfarin - Pharmacist Dosing Inpatient   Does not apply q1600   Continuous Infusions: . cefTRIAXone (ROCEPHIN)  IV 2 g (08/09/20 1057)     LOS: 5 days    Time spent: 25 minutes.   Domenic Polite, MD [facility]  08/09/2020, 11:38 AM

## 2020-08-09 NOTE — Discharge Instructions (Signed)
Information on my medicine - Coumadin   (Warfarin)  This medication education was reviewed with me or my healthcare representative as part of my discharge preparation.   Why was Coumadin prescribed for you? Coumadin was prescribed for you because you have a blood clot or a medical condition that can cause an increased risk of forming blood clots. Blood clots can cause serious health problems by blocking the flow of blood to the heart, lung, or brain. Coumadin can prevent harmful blood clots from forming. As a reminder your indication for Coumadin is:   Select from menu  Left Ventricle Thrombosis.  What test will check on my response to Coumadin? While on Coumadin (warfarin) you will need to have an INR test regularly to ensure that your dose is keeping you in the desired range. The INR (international normalized ratio) number is calculated from the result of the laboratory test called prothrombin time (PT).  If an INR APPOINTMENT HAS NOT ALREADY BEEN MADE FOR YOU please schedule an appointment to have this lab work done by your health care provider within 7 days. Your INR goal is usually a number between:  2 to 3 or your provider may give you a more narrow range like 2-2.5.  Ask your health care provider during an office visit what your goal INR is.  What  do you need to  know  About  COUMADIN? Take Coumadin (warfarin) exactly as prescribed by your healthcare provider about the same time each day.  DO NOT stop taking without talking to the doctor who prescribed the medication.  Stopping without other blood clot prevention medication to take the place of Coumadin may increase your risk of developing a new clot or stroke.  Get refills before you run out.  What do you do if you miss a dose? If you miss a dose, take it as soon as you remember on the same day then continue your regularly scheduled regimen the next day.  Do not take two doses of Coumadin at the same time.  Important Safety  Information A possible side effect of Coumadin (Warfarin) is an increased risk of bleeding. You should call your healthcare provider right away if you experience any of the following: ? Bleeding from an injury or your nose that does not stop. ? Unusual colored urine (red or dark brown) or unusual colored stools (red or black). ? Unusual bruising for unknown reasons. ? A serious fall or if you hit your head (even if there is no bleeding).  Some foods or medicines interact with Coumadin (warfarin) and might alter your response to warfarin. To help avoid this: ? Eat a balanced diet, maintaining a consistent amount of Vitamin K. ? Notify your provider about major diet changes you plan to make. ? Avoid alcohol or limit your intake to 1 drink for women and 2 drinks for men per day. (1 drink is 5 oz. wine, 12 oz. beer, or 1.5 oz. liquor.)  Make sure that ANY health care provider who prescribes medication for you knows that you are taking Coumadin (warfarin).  Also make sure the healthcare provider who is monitoring your Coumadin knows when you have started a new medication including herbals and non-prescription products.  Coumadin (Warfarin)  Major Drug Interactions  Increased Warfarin Effect Decreased Warfarin Effect  Alcohol (large quantities) Antibiotics (esp. Septra/Bactrim, Flagyl, Cipro) Amiodarone (Cordarone) Aspirin (ASA) Cimetidine (Tagamet) Megestrol (Megace) NSAIDs (ibuprofen, naproxen, etc.) Piroxicam (Feldene) Propafenone (Rythmol SR) Propranolol (Inderal) Isoniazid (INH) Posaconazole (Noxafil) Barbiturates (Phenobarbital)  Tegretol) °Chlordiazepoxide (Librium) °Cholestyramine (Questran) °Griseofulvin °Oral Contraceptives °Rifampin °Sucralfate (Carafate) °Vitamin K  ° °Coumadin® (Warfarin) Major Herbal Interactions  °Increased Warfarin Effect Decreased Warfarin Effect  °Garlic °Ginseng °Ginkgo biloba Coenzyme Q10 °Green tea °St. John’s wort   ° °Coumadin® (Warfarin)  FOOD Interactions  °Eat a consistent number of servings per week of foods HIGH in Vitamin K °(1 serving = ½ cup)  °Collards (cooked, or boiled & drained) °Kale (cooked, or boiled & drained) °Mustard greens (cooked, or boiled & drained) °Parsley *serving size only = ¼ cup °Spinach (cooked, or boiled & drained) °Swiss chard (cooked, or boiled & drained) °Turnip greens (cooked, or boiled & drained)  °Eat a consistent number of servings per week of foods MEDIUM-HIGH in Vitamin K °(1 serving = 1 cup)  °Asparagus (cooked, or boiled & drained) °Broccoli (cooked, boiled & drained, or raw & chopped) °Brussel sprouts (cooked, or boiled & drained) *serving size only = ½ cup °Lettuce, raw (green leaf, endive, romaine) °Spinach, raw °Turnip greens, raw & chopped  ° °These websites have more information on Coumadin (warfarin):  www.coumadin.com; °www.ahrq.gov/consumer/coumadin.htm; ° ° ° °

## 2020-08-09 NOTE — Plan of Care (Signed)

## 2020-08-10 DIAGNOSIS — N1831 Chronic kidney disease, stage 3a: Secondary | ICD-10-CM | POA: Diagnosis not present

## 2020-08-10 DIAGNOSIS — I10 Essential (primary) hypertension: Secondary | ICD-10-CM | POA: Diagnosis not present

## 2020-08-10 DIAGNOSIS — R109 Unspecified abdominal pain: Secondary | ICD-10-CM | POA: Diagnosis not present

## 2020-08-10 DIAGNOSIS — I2510 Atherosclerotic heart disease of native coronary artery without angina pectoris: Secondary | ICD-10-CM | POA: Diagnosis not present

## 2020-08-10 LAB — PROTIME-INR
INR: 1.5 — ABNORMAL HIGH (ref 0.8–1.2)
Prothrombin Time: 17.7 seconds — ABNORMAL HIGH (ref 11.4–15.2)

## 2020-08-10 LAB — MAGNESIUM: Magnesium: 1.9 mg/dL (ref 1.7–2.4)

## 2020-08-10 LAB — GLUCOSE, CAPILLARY
Glucose-Capillary: 153 mg/dL — ABNORMAL HIGH (ref 70–99)
Glucose-Capillary: 178 mg/dL — ABNORMAL HIGH (ref 70–99)

## 2020-08-10 MED ORDER — COUMADIN BOOK
Freq: Once | Status: DC
  Filled 2020-08-10 (×2): qty 1

## 2020-08-10 MED ORDER — WARFARIN SODIUM 5 MG PO TABS: ORAL_TABLET | ORAL | 1 refills | Status: DC

## 2020-08-10 MED ORDER — ENOXAPARIN SODIUM 80 MG/0.8ML ~~LOC~~ SOLN: 1.0000 mg/kg | Freq: Two times a day (BID) | SUBCUTANEOUS | 1 refills | Status: DC

## 2020-08-10 MED ORDER — ACETAMINOPHEN 325 MG PO TABS: 650.0000 mg | ORAL_TABLET | Freq: Four times a day (QID) | ORAL | Status: DC | PRN

## 2020-08-10 MED ORDER — TAMSULOSIN HCL 0.4 MG PO CAPS: 0.4000 mg | ORAL_CAPSULE | Freq: Every day | ORAL | 0 refills | Status: DC

## 2020-08-10 MED ORDER — TOUJEO SOLOSTAR 300 UNIT/ML ~~LOC~~ SOPN: 20.0000 [IU] | PEN_INJECTOR | Freq: Every day | SUBCUTANEOUS | Status: DC

## 2020-08-10 NOTE — Progress Notes (Signed)
--  See ADDENDUM below  [place] for Heparin Indication: LV thrombus  Allergies  Allergen Reactions  . Lipitor [Atorvastatin] Other (See Comments)    Muscle pain    Patient Measurements: Height: 5\' 11"  (180.3 cm) Weight: 81 kg (178 lb 10 oz) IBW/kg (Calculated) : 75.3 Heparin Dosing Weight:  77.1 kg  Vital Signs: Temp: 98.3 F (36.8 C) (01/21 1046) Temp Source: Oral (01/21 1046) BP: 141/69 (01/21 1046) Pulse Rate: 100 (01/21 1046)  Labs: Recent Labs    08/07/20 2041 08/08/20 0303 08/08/20 1352 08/09/20 0736 08/10/20 0409  HGB  --  8.3*  --  9.2*  --   HCT  --  26.7*  --  28.1*  --   PLT  --  433*  --  309  --   LABPROT  --   --   --  16.8* 17.7*  INR  --   --   --  1.4* 1.5*  HEPARINUNFRC <0.10* <0.10* <0.10*  --   --   CREATININE  --   --   --  1.08  --     Estimated Creatinine Clearance: 62.9 mL/min (by C-G formula based on SCr of 1.08 mg/dL).   Medical History: Past Medical History:  Diagnosis Date  . Arthritis   . 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 Cancer (HCC)    Skin  . Cerebral artery disease   . Coronary artery disease   . COVID-07 Dec 2019  . COVID-19 virus infection 12/02/2019  . Depression   . Essential hypertension   . Hernia, umbilical   . HOH (hard of hearing)   . Hyperlipidemia   . NSTEMI (non-ST elevated myocardial infarction) ([place])   . OSA on CPAP   . Pulmonary emboli [facility])    May 2021  . Type 2 diabetes mellitus (HCC)    Assessment: CC/HPI: stomach pressure, N/V, abdominal pain  PMH: HTN, HLD, T2DM, CAD, hx CVA and PE 5/21, hx urgent CABG x4 in 06/2020, CHF, skin ca, COVID 5/21, HOH, OSA, Afib. CKD  Anticoag: ?Eliquis PTA (LF 06/08/20, INR only 1.3). H/o afib and CVA and PE 5/21. 1/17 Doppler negative for DVT.  1/17 Echo: LV Apical thrombus.   After discussion with Dr. Broadus John  On 1/19 we stopped IV heparin, switched to Lovenox due to undetectable Heparin levels x3 on high heparin  drip rates.  Warfarin started 08/08/20.   Dr. Broadus John plans discharge home today 08/10/20, with plan for  INR check on Monday 08/13/20 per cardioligist.  Patient's wife to give sq lovenox 80mg  sq q12h to patient.   INR 1.4>1.5 after 6mg  Warfarin x 2 days.  Hgb 9.2, pltc  wnl on 1/20.  No bleeding noted.  There is a potential drug drug interaction with fenofibrate may increase warfarin hypothrombinemic effect/ monitor INR and adjus warfarin.   CKD ,SCr trending down to 1.36 >1.08, CrCl 63 ml/min Recent CABG 07/09/20   Goal of Therapy:  INR goal 2-3 Monitor platelets by anticoagulation protocol: Yes   Plan:  I recommend Warfarin 7.5mg  today and 1/22 then 5mg  on 1/23, then INR on 1/24 Monday  (prescribe Rx as 5mg  tablets) Lovenox 80 mg sq q12hour until INR =or >2, goal INR 2-3 Daily INR, HL and CBC until discharged.  Nicole Cella, RPh Clinical Pharmacist Please check AMION for all [place] phone numbers After 10:00 PM, call [place] [phone]  08/10/2020 12:14 PM

## 2020-08-10 NOTE — TOC Benefit Eligibility Note (Signed)
Transition of Care [facility]) Benefit Eligibility Note    Patient Details  Name: Patrick Miranda MRN: [id number] Date of Birth: [birth year]   Medication/Dose: Enoxaparin 80mg . bid  Covered?: Yes  Tier: Other (Tier 4)  Prescription Coverage Preferred Pharmacy: [facility] with Person/Company/Phone Number:: Donato Schultz. [place] [phone]  Co-Pay: $95.00  Prior Approval: No  Deductible: Unmet       Shelda Altes Phone Number: 08/10/2020, 10:42 AM

## 2020-08-10 NOTE — TOC Transition Note (Signed)
Transition of Care [facility]) - CM/SW Discharge Note   Patient Details  Name: Patrick Miranda MRN: [id number] Date of Birth: 1946-02-13  Transition of Care [facility]) CM/SW Contact:  Bartholomew Crews, RN Phone Number: [phone] 08/10/2020, 10:25 AM   Clinical Narrative:     Spoke with patient and spouse at the bedside to discuss transition plans. Spouse has given lovenox injections in the past and feels comfortable doing this for patient. Requested benefits check for lovenox. Spoke with Ramond Marrow at [facility]. Patient will need INR check on Monday which will need to be sent to [facility]. Patient will need HH RN, PT orders with Face to Face. Preferred pharmacy is correct in [place]. Spouse to provide transport home. No further TOC needs identified at this time.   Final next level of care: Home w Home Health Services Barriers to Discharge: No Barriers Identified   Patient Goals and CMS Choice Patient states their goals for this hospitalization and ongoing recovery are:: home with wife CMS Medicare.gov Compare Post Acute Care list provided to:: Patient Choice offered to / list presented to : [facility]  Discharge Placement                       Discharge Plan and Services   Discharge Planning Services: CM Consult Post Acute Care Choice: Home Health          DME Arranged: N/A DME Agency: NA       HH Arranged: PT,RN [place] Agency: [place] ([place]) Date [place]: 08/10/20 Time [place]: [phone] Representative spoke with at [place]: [place] ([place]) Interventions     Readmission Risk Interventions No flowsheet data found.

## 2020-08-10 NOTE — Progress Notes (Signed)
DISCHARGE NOTE HOME  Patrick Miranda to be discharged Home per MD order. Discussed prescriptions and follow up appointments with the patient. Prescriptions given to patient; medication list explained in detail. Patient verbalized understanding.  Skin clean, dry and intact without evidence of skin break down, no evidence of skin tears noted. IV catheter discontinued intact. Site without signs and symptoms of complications. Dressing and pressure applied. Pt denies pain at the site currently. No complaints noted.  Patient free of lines, drains, and wounds.   An After Visit Summary (AVS) was printed and given to the patient. Patient escorted via wheelchair, and discharged home via private auto.  [name], RN

## 2020-08-13 ENCOUNTER — Ambulatory Visit (INDEPENDENT_AMBULATORY_CARE_PROVIDER_SITE_OTHER): Payer: Medicare HMO | Admitting: *Deleted

## 2020-08-13 ENCOUNTER — Telehealth: Payer: Self-pay | Admitting: Physician Assistant

## 2020-08-13 DIAGNOSIS — I2694 Multiple subsegmental pulmonary emboli without acute cor pulmonale: Secondary | ICD-10-CM

## 2020-08-13 DIAGNOSIS — I5022 Chronic systolic (congestive) heart failure: Secondary | ICD-10-CM | POA: Diagnosis not present

## 2020-08-13 DIAGNOSIS — N1831 Chronic kidney disease, stage 3a: Secondary | ICD-10-CM | POA: Diagnosis not present

## 2020-08-13 DIAGNOSIS — I480 Paroxysmal atrial fibrillation: Secondary | ICD-10-CM | POA: Diagnosis not present

## 2020-08-13 DIAGNOSIS — I2510 Atherosclerotic heart disease of native coronary artery without angina pectoris: Secondary | ICD-10-CM | POA: Diagnosis not present

## 2020-08-13 DIAGNOSIS — I252 Old myocardial infarction: Secondary | ICD-10-CM | POA: Diagnosis not present

## 2020-08-13 DIAGNOSIS — Z5181 Encounter for therapeutic drug level monitoring: Secondary | ICD-10-CM | POA: Diagnosis not present

## 2020-08-13 DIAGNOSIS — I130 Hypertensive heart and chronic kidney disease with heart failure and stage 1 through stage 4 chronic kidney disease, or unspecified chronic kidney disease: Secondary | ICD-10-CM | POA: Diagnosis not present

## 2020-08-13 DIAGNOSIS — E1165 Type 2 diabetes mellitus with hyperglycemia: Secondary | ICD-10-CM | POA: Diagnosis not present

## 2020-08-13 DIAGNOSIS — I513 Intracardiac thrombosis, not elsewhere classified: Secondary | ICD-10-CM

## 2020-08-13 DIAGNOSIS — I639 Cerebral infarction, unspecified: Secondary | ICD-10-CM

## 2020-08-13 DIAGNOSIS — E1122 Type 2 diabetes mellitus with diabetic chronic kidney disease: Secondary | ICD-10-CM | POA: Diagnosis not present

## 2020-08-13 DIAGNOSIS — I255 Ischemic cardiomyopathy: Secondary | ICD-10-CM | POA: Diagnosis not present

## 2020-08-13 NOTE — Patient Instructions (Signed)
Start warfarin 5mg  daily except 7.5mg  on Mondays, Wednesdays and Fridays Recheck on Thursdays 08/16/20 Stop Lovenox injections Order given to [facility]

## 2020-08-13 NOTE — Telephone Encounter (Signed)
PT 24.6 INR 2.0   This is their first visit with this patient,  Please leave her a message if she does not answer

## 2020-08-13 NOTE — Telephone Encounter (Signed)
Spoke with [facility].  Coumadin orders given.  See coumadin note.

## 2020-08-14 ENCOUNTER — Ambulatory Visit (INDEPENDENT_AMBULATORY_CARE_PROVIDER_SITE_OTHER): Payer: Medicare HMO | Admitting: Physician Assistant

## 2020-08-14 ENCOUNTER — Encounter: Payer: Self-pay | Admitting: Physician Assistant

## 2020-08-14 ENCOUNTER — Encounter (INDEPENDENT_AMBULATORY_CARE_PROVIDER_SITE_OTHER): Payer: Self-pay | Admitting: *Deleted

## 2020-08-14 ENCOUNTER — Other Ambulatory Visit: Payer: Self-pay

## 2020-08-14 VITALS — BP 132/60 | HR 97 | Ht 70.5 in | Wt 169.0 lb

## 2020-08-14 DIAGNOSIS — R109 Unspecified abdominal pain: Secondary | ICD-10-CM

## 2020-08-14 DIAGNOSIS — I480 Paroxysmal atrial fibrillation: Secondary | ICD-10-CM

## 2020-08-14 DIAGNOSIS — I25810 Atherosclerosis of coronary artery bypass graft(s) without angina pectoris: Secondary | ICD-10-CM | POA: Diagnosis not present

## 2020-08-14 DIAGNOSIS — I513 Intracardiac thrombosis, not elsewhere classified: Secondary | ICD-10-CM | POA: Diagnosis not present

## 2020-08-14 DIAGNOSIS — I255 Ischemic cardiomyopathy: Secondary | ICD-10-CM

## 2020-08-14 DIAGNOSIS — E1122 Type 2 diabetes mellitus with diabetic chronic kidney disease: Secondary | ICD-10-CM | POA: Diagnosis not present

## 2020-08-14 DIAGNOSIS — I252 Old myocardial infarction: Secondary | ICD-10-CM | POA: Diagnosis not present

## 2020-08-14 DIAGNOSIS — I130 Hypertensive heart and chronic kidney disease with heart failure and stage 1 through stage 4 chronic kidney disease, or unspecified chronic kidney disease: Secondary | ICD-10-CM | POA: Diagnosis not present

## 2020-08-14 DIAGNOSIS — N1831 Chronic kidney disease, stage 3a: Secondary | ICD-10-CM | POA: Diagnosis not present

## 2020-08-14 DIAGNOSIS — I5022 Chronic systolic (congestive) heart failure: Secondary | ICD-10-CM | POA: Diagnosis not present

## 2020-08-14 DIAGNOSIS — I2510 Atherosclerotic heart disease of native coronary artery without angina pectoris: Secondary | ICD-10-CM | POA: Diagnosis not present

## 2020-08-14 DIAGNOSIS — E1165 Type 2 diabetes mellitus with hyperglycemia: Secondary | ICD-10-CM | POA: Diagnosis not present

## 2020-08-14 NOTE — Patient Instructions (Signed)
Medication Instructions:  Your physician recommends that you continue on your current medications as directed. Please refer to the Current Medication list given to you today.  *If you need a refill on your cardiac medications before your next appointment, please call your pharmacy*   Lab Work: NONE   If you have labs (blood work) drawn today and your tests are completely normal, you will receive your results only by: Marland Kitchen MyChart Message (if you have MyChart) OR . A paper copy in the mail If you have any lab test that is abnormal or we need to change your treatment, we will call you to review the results.   Testing/Procedures: NONE    Follow-Up: At [facility], you and your health needs are our priority.  As part of our continuing mission to provide you with exceptional heart care, we have created designated Provider Care Teams.  These Care Teams include your primary Cardiologist (physician) and Advanced Practice Providers (APPs -  Physician Assistants and Nurse Practitioners) who all work together to provide you with the care you need, when you need it.  We recommend signing up for the patient portal called "MyChart".  Sign up information is provided on this After Visit Summary.  MyChart is used to connect with patients for Virtual Visits (Telemedicine).  Patients are able to view lab/test results, encounter notes, upcoming appointments, etc.  Non-urgent messages can be sent to your provider as well.   To learn more about what you can do with MyChart, go to [email or web address].    Your next appointment:   4-6 week(s)  The format for your next appointment:   In Person  Provider:   Rozann Lesches, MD   Other Instructions Thank you for choosing [facility]!

## 2020-08-15 DIAGNOSIS — N390 Urinary tract infection, site not specified: Secondary | ICD-10-CM | POA: Diagnosis not present

## 2020-08-15 DIAGNOSIS — R1030 Lower abdominal pain, unspecified: Secondary | ICD-10-CM | POA: Diagnosis not present

## 2020-08-15 DIAGNOSIS — E1129 Type 2 diabetes mellitus with other diabetic kidney complication: Secondary | ICD-10-CM | POA: Diagnosis not present

## 2020-08-15 DIAGNOSIS — R531 Weakness: Secondary | ICD-10-CM | POA: Diagnosis not present

## 2020-08-15 DIAGNOSIS — E1169 Type 2 diabetes mellitus with other specified complication: Secondary | ICD-10-CM | POA: Diagnosis not present

## 2020-08-15 DIAGNOSIS — I10 Essential (primary) hypertension: Secondary | ICD-10-CM | POA: Diagnosis not present

## 2020-08-15 DIAGNOSIS — N1831 Chronic kidney disease, stage 3a: Secondary | ICD-10-CM | POA: Diagnosis not present

## 2020-08-15 DIAGNOSIS — Z7901 Long term (current) use of anticoagulants: Secondary | ICD-10-CM | POA: Diagnosis not present

## 2020-08-15 DIAGNOSIS — Z794 Long term (current) use of insulin: Secondary | ICD-10-CM | POA: Diagnosis not present

## 2020-08-15 DIAGNOSIS — R634 Abnormal weight loss: Secondary | ICD-10-CM | POA: Diagnosis not present

## 2020-08-15 DIAGNOSIS — D649 Anemia, unspecified: Secondary | ICD-10-CM | POA: Diagnosis not present

## 2020-08-16 ENCOUNTER — Telehealth: Payer: Self-pay | Admitting: Cardiology

## 2020-08-16 ENCOUNTER — Other Ambulatory Visit (HOSPITAL_COMMUNITY)
Admission: RE | Admit: 2020-08-16 | Discharge: 2020-08-16 | Disposition: A | Discharge status: Home / Self Care | Payer: Medicare HMO | Source: Other Acute Inpatient Hospital | Attending: Cardiology | Admitting: Cardiology

## 2020-08-16 ENCOUNTER — Ambulatory Visit: Payer: Self-pay | Admitting: *Deleted

## 2020-08-16 DIAGNOSIS — I2694 Multiple subsegmental pulmonary emboli without acute cor pulmonale: Secondary | ICD-10-CM

## 2020-08-16 DIAGNOSIS — I513 Intracardiac thrombosis, not elsewhere classified: Secondary | ICD-10-CM | POA: Diagnosis not present

## 2020-08-16 DIAGNOSIS — I5022 Chronic systolic (congestive) heart failure: Secondary | ICD-10-CM | POA: Diagnosis not present

## 2020-08-16 DIAGNOSIS — R112 Nausea with vomiting, unspecified: Secondary | ICD-10-CM | POA: Diagnosis not present

## 2020-08-16 DIAGNOSIS — R197 Diarrhea, unspecified: Secondary | ICD-10-CM | POA: Diagnosis not present

## 2020-08-16 DIAGNOSIS — I480 Paroxysmal atrial fibrillation: Secondary | ICD-10-CM

## 2020-08-16 DIAGNOSIS — I255 Ischemic cardiomyopathy: Secondary | ICD-10-CM | POA: Diagnosis not present

## 2020-08-16 DIAGNOSIS — I130 Hypertensive heart and chronic kidney disease with heart failure and stage 1 through stage 4 chronic kidney disease, or unspecified chronic kidney disease: Secondary | ICD-10-CM | POA: Diagnosis not present

## 2020-08-16 DIAGNOSIS — R791 Abnormal coagulation profile: Secondary | ICD-10-CM | POA: Diagnosis not present

## 2020-08-16 DIAGNOSIS — I639 Cerebral infarction, unspecified: Secondary | ICD-10-CM

## 2020-08-16 DIAGNOSIS — I252 Old myocardial infarction: Secondary | ICD-10-CM | POA: Diagnosis not present

## 2020-08-16 DIAGNOSIS — E1165 Type 2 diabetes mellitus with hyperglycemia: Secondary | ICD-10-CM | POA: Diagnosis not present

## 2020-08-16 DIAGNOSIS — N3289 Other specified disorders of bladder: Secondary | ICD-10-CM | POA: Diagnosis not present

## 2020-08-16 DIAGNOSIS — N1831 Chronic kidney disease, stage 3a: Secondary | ICD-10-CM | POA: Diagnosis not present

## 2020-08-16 DIAGNOSIS — N1330 Unspecified hydronephrosis: Secondary | ICD-10-CM | POA: Diagnosis not present

## 2020-08-16 DIAGNOSIS — E1122 Type 2 diabetes mellitus with diabetic chronic kidney disease: Secondary | ICD-10-CM | POA: Diagnosis not present

## 2020-08-16 DIAGNOSIS — I2510 Atherosclerotic heart disease of native coronary artery without angina pectoris: Secondary | ICD-10-CM | POA: Diagnosis not present

## 2020-08-16 LAB — PROTIME-INR
INR: 8.6 (ref 0.8–1.2)
Prothrombin Time: 68.9 seconds — ABNORMAL HIGH (ref 11.4–15.2)

## 2020-08-16 LAB — POCT INR: INR: 8 — AB (ref 2.0–3.0)

## 2020-08-16 NOTE — Telephone Encounter (Signed)
INR 8.5 - I talked to pt's wife - NO coumadin and that the office will call on Friday for further instructions.   No bleeding now but if develops to go to Er.

## 2020-08-17 DIAGNOSIS — I252 Old myocardial infarction: Secondary | ICD-10-CM | POA: Diagnosis not present

## 2020-08-17 DIAGNOSIS — N1831 Chronic kidney disease, stage 3a: Secondary | ICD-10-CM | POA: Diagnosis not present

## 2020-08-17 DIAGNOSIS — E1122 Type 2 diabetes mellitus with diabetic chronic kidney disease: Secondary | ICD-10-CM | POA: Diagnosis not present

## 2020-08-17 DIAGNOSIS — R3912 Poor urinary stream: Secondary | ICD-10-CM | POA: Diagnosis not present

## 2020-08-17 DIAGNOSIS — I513 Intracardiac thrombosis, not elsewhere classified: Secondary | ICD-10-CM | POA: Diagnosis not present

## 2020-08-17 DIAGNOSIS — I5022 Chronic systolic (congestive) heart failure: Secondary | ICD-10-CM | POA: Diagnosis not present

## 2020-08-17 DIAGNOSIS — I130 Hypertensive heart and chronic kidney disease with heart failure and stage 1 through stage 4 chronic kidney disease, or unspecified chronic kidney disease: Secondary | ICD-10-CM | POA: Diagnosis not present

## 2020-08-17 DIAGNOSIS — N401 Enlarged prostate with lower urinary tract symptoms: Secondary | ICD-10-CM | POA: Diagnosis not present

## 2020-08-17 DIAGNOSIS — I2510 Atherosclerotic heart disease of native coronary artery without angina pectoris: Secondary | ICD-10-CM | POA: Diagnosis not present

## 2020-08-17 DIAGNOSIS — E1165 Type 2 diabetes mellitus with hyperglycemia: Secondary | ICD-10-CM | POA: Diagnosis not present

## 2020-08-17 DIAGNOSIS — I255 Ischemic cardiomyopathy: Secondary | ICD-10-CM | POA: Diagnosis not present

## 2020-08-17 DIAGNOSIS — R3915 Urgency of urination: Secondary | ICD-10-CM | POA: Diagnosis not present

## 2020-08-20 ENCOUNTER — Telehealth: Payer: Self-pay | Admitting: Physician Assistant

## 2020-08-20 ENCOUNTER — Ambulatory Visit: Payer: Self-pay | Admitting: *Deleted

## 2020-08-20 ENCOUNTER — Encounter (HOSPITAL_COMMUNITY)
Admission: RE | Admit: 2020-08-20 | Discharge: 2020-08-20 | Disposition: A | Discharge status: Home / Self Care | Payer: Medicare HMO | Source: Skilled Nursing Facility | Attending: Cardiology | Admitting: Cardiology

## 2020-08-20 DIAGNOSIS — I513 Intracardiac thrombosis, not elsewhere classified: Secondary | ICD-10-CM | POA: Diagnosis not present

## 2020-08-20 DIAGNOSIS — E1122 Type 2 diabetes mellitus with diabetic chronic kidney disease: Secondary | ICD-10-CM | POA: Diagnosis not present

## 2020-08-20 DIAGNOSIS — R0902 Hypoxemia: Secondary | ICD-10-CM | POA: Diagnosis not present

## 2020-08-20 DIAGNOSIS — I130 Hypertensive heart and chronic kidney disease with heart failure and stage 1 through stage 4 chronic kidney disease, or unspecified chronic kidney disease: Secondary | ICD-10-CM | POA: Diagnosis not present

## 2020-08-20 DIAGNOSIS — I252 Old myocardial infarction: Secondary | ICD-10-CM | POA: Diagnosis not present

## 2020-08-20 DIAGNOSIS — R4020 Unspecified coma: Secondary | ICD-10-CM | POA: Diagnosis not present

## 2020-08-20 DIAGNOSIS — R791 Abnormal coagulation profile: Secondary | ICD-10-CM | POA: Insufficient documentation

## 2020-08-20 DIAGNOSIS — I2510 Atherosclerotic heart disease of native coronary artery without angina pectoris: Secondary | ICD-10-CM | POA: Diagnosis not present

## 2020-08-20 DIAGNOSIS — N1831 Chronic kidney disease, stage 3a: Secondary | ICD-10-CM | POA: Diagnosis not present

## 2020-08-20 DIAGNOSIS — E1165 Type 2 diabetes mellitus with hyperglycemia: Secondary | ICD-10-CM | POA: Diagnosis not present

## 2020-08-20 DIAGNOSIS — R404 Transient alteration of awareness: Secondary | ICD-10-CM | POA: Diagnosis not present

## 2020-08-20 DIAGNOSIS — E162 Hypoglycemia, unspecified: Secondary | ICD-10-CM | POA: Diagnosis not present

## 2020-08-20 DIAGNOSIS — I255 Ischemic cardiomyopathy: Secondary | ICD-10-CM | POA: Diagnosis not present

## 2020-08-20 DIAGNOSIS — E161 Other hypoglycemia: Secondary | ICD-10-CM | POA: Diagnosis not present

## 2020-08-20 DIAGNOSIS — I5022 Chronic systolic (congestive) heart failure: Secondary | ICD-10-CM | POA: Diagnosis not present

## 2020-08-20 LAB — POCT INR: INR: 8 — AB (ref 2.0–3.0)

## 2020-08-20 NOTE — Telephone Encounter (Signed)
New message    Patients PTINR level ,  Same as it was 1/27/ inr 8 Pt 96

## 2020-08-20 NOTE — Telephone Encounter (Signed)
Called [facility] RN [facility].  See coumadin note.

## 2020-08-21 ENCOUNTER — Telehealth: Payer: Self-pay | Admitting: Cardiology

## 2020-08-21 ENCOUNTER — Other Ambulatory Visit (HOSPITAL_COMMUNITY)
Admission: RE | Admit: 2020-08-21 | Discharge: 2020-08-21 | Disposition: A | Discharge status: Home / Self Care | Payer: Medicare HMO | Source: Skilled Nursing Facility | Attending: Cardiology | Admitting: Cardiology

## 2020-08-21 DIAGNOSIS — R791 Abnormal coagulation profile: Secondary | ICD-10-CM | POA: Insufficient documentation

## 2020-08-21 DIAGNOSIS — E1122 Type 2 diabetes mellitus with diabetic chronic kidney disease: Secondary | ICD-10-CM | POA: Diagnosis not present

## 2020-08-21 DIAGNOSIS — N1831 Chronic kidney disease, stage 3a: Secondary | ICD-10-CM | POA: Diagnosis not present

## 2020-08-21 DIAGNOSIS — E1165 Type 2 diabetes mellitus with hyperglycemia: Secondary | ICD-10-CM | POA: Diagnosis not present

## 2020-08-21 DIAGNOSIS — I130 Hypertensive heart and chronic kidney disease with heart failure and stage 1 through stage 4 chronic kidney disease, or unspecified chronic kidney disease: Secondary | ICD-10-CM | POA: Diagnosis not present

## 2020-08-21 DIAGNOSIS — I255 Ischemic cardiomyopathy: Secondary | ICD-10-CM | POA: Diagnosis not present

## 2020-08-21 DIAGNOSIS — I5022 Chronic systolic (congestive) heart failure: Secondary | ICD-10-CM | POA: Diagnosis not present

## 2020-08-21 DIAGNOSIS — I513 Intracardiac thrombosis, not elsewhere classified: Secondary | ICD-10-CM | POA: Diagnosis not present

## 2020-08-21 DIAGNOSIS — I252 Old myocardial infarction: Secondary | ICD-10-CM | POA: Diagnosis not present

## 2020-08-21 DIAGNOSIS — I2510 Atherosclerotic heart disease of native coronary artery without angina pectoris: Secondary | ICD-10-CM | POA: Diagnosis not present

## 2020-08-21 LAB — PROTIME-INR
INR: 5 (ref 0.8–1.2)
Prothrombin Time: 45.1 seconds — ABNORMAL HIGH (ref 11.4–15.2)

## 2020-08-21 NOTE — Patient Instructions (Signed)
POC INR 8.0   Blood drawn and taken to [facility] lab for STAT PT/INR  Sample didn't have enough blood.  (Redrawn on 08/21/20.  INR 5.0) Spoke with Caryl Pina RN [facility] while at pt's home.   Patient/wife told to HOLD warfarin until results come back from [facility] lab.  Pt denies any sign of bleeding.  Instructed if he should develop any bleeding or fall/injury he should go to [facility] ED for evaluation. Call Caryl Pina RN [facility]  [phone] with orders   Received call from Duwayne Heck RN [facility]  INR was 5.0 today.  Hold warfarin tonight and tomorrow night and recheck INR on Thursday 08/23/20.  She is calling pt with instructions.

## 2020-08-21 NOTE — Telephone Encounter (Signed)
Critical inr

## 2020-08-23 ENCOUNTER — Ambulatory Visit (INDEPENDENT_AMBULATORY_CARE_PROVIDER_SITE_OTHER): Payer: Medicare HMO | Admitting: *Deleted

## 2020-08-23 DIAGNOSIS — N1831 Chronic kidney disease, stage 3a: Secondary | ICD-10-CM | POA: Diagnosis not present

## 2020-08-23 DIAGNOSIS — E1165 Type 2 diabetes mellitus with hyperglycemia: Secondary | ICD-10-CM | POA: Diagnosis not present

## 2020-08-23 DIAGNOSIS — I130 Hypertensive heart and chronic kidney disease with heart failure and stage 1 through stage 4 chronic kidney disease, or unspecified chronic kidney disease: Secondary | ICD-10-CM | POA: Diagnosis not present

## 2020-08-23 DIAGNOSIS — I480 Paroxysmal atrial fibrillation: Secondary | ICD-10-CM

## 2020-08-23 DIAGNOSIS — Z5181 Encounter for therapeutic drug level monitoring: Secondary | ICD-10-CM

## 2020-08-23 DIAGNOSIS — I2510 Atherosclerotic heart disease of native coronary artery without angina pectoris: Secondary | ICD-10-CM | POA: Diagnosis not present

## 2020-08-23 DIAGNOSIS — I513 Intracardiac thrombosis, not elsewhere classified: Secondary | ICD-10-CM | POA: Diagnosis not present

## 2020-08-23 DIAGNOSIS — E1122 Type 2 diabetes mellitus with diabetic chronic kidney disease: Secondary | ICD-10-CM | POA: Diagnosis not present

## 2020-08-23 DIAGNOSIS — I5022 Chronic systolic (congestive) heart failure: Secondary | ICD-10-CM | POA: Diagnosis not present

## 2020-08-23 DIAGNOSIS — I639 Cerebral infarction, unspecified: Secondary | ICD-10-CM

## 2020-08-23 DIAGNOSIS — I255 Ischemic cardiomyopathy: Secondary | ICD-10-CM | POA: Diagnosis not present

## 2020-08-23 DIAGNOSIS — I252 Old myocardial infarction: Secondary | ICD-10-CM | POA: Diagnosis not present

## 2020-08-23 LAB — POCT INR: INR: 3 (ref 2.0–3.0)

## 2020-08-23 NOTE — Patient Instructions (Signed)
Take warfarin 2.5mg  daily except 5mg  on Saturday Recheck INR on 08/27/20 Orders given to [facility]

## 2020-08-24 ENCOUNTER — Encounter (HOSPITAL_COMMUNITY): Payer: Self-pay

## 2020-08-24 ENCOUNTER — Emergency Department (HOSPITAL_COMMUNITY): Payer: Medicare HMO

## 2020-08-24 ENCOUNTER — Emergency Department (HOSPITAL_COMMUNITY)
Admission: EM | Admit: 2020-08-24 | Discharge: 2020-08-25 | Disposition: A | Discharge status: Home / Self Care | Payer: Medicare HMO | Attending: Emergency Medicine | Admitting: Emergency Medicine

## 2020-08-24 ENCOUNTER — Other Ambulatory Visit: Payer: Self-pay

## 2020-08-24 DIAGNOSIS — Z7901 Long term (current) use of anticoagulants: Secondary | ICD-10-CM | POA: Diagnosis not present

## 2020-08-24 DIAGNOSIS — Z7984 Long term (current) use of oral hypoglycemic drugs: Secondary | ICD-10-CM | POA: Insufficient documentation

## 2020-08-24 DIAGNOSIS — I6782 Cerebral ischemia: Secondary | ICD-10-CM | POA: Insufficient documentation

## 2020-08-24 DIAGNOSIS — Z8616 Personal history of COVID-19: Secondary | ICD-10-CM | POA: Diagnosis not present

## 2020-08-24 DIAGNOSIS — K529 Noninfective gastroenteritis and colitis, unspecified: Secondary | ICD-10-CM | POA: Diagnosis not present

## 2020-08-24 DIAGNOSIS — N1831 Chronic kidney disease, stage 3a: Secondary | ICD-10-CM | POA: Diagnosis not present

## 2020-08-24 DIAGNOSIS — I2510 Atherosclerotic heart disease of native coronary artery without angina pectoris: Secondary | ICD-10-CM | POA: Diagnosis not present

## 2020-08-24 DIAGNOSIS — E1122 Type 2 diabetes mellitus with diabetic chronic kidney disease: Secondary | ICD-10-CM | POA: Insufficient documentation

## 2020-08-24 DIAGNOSIS — Z7982 Long term (current) use of aspirin: Secondary | ICD-10-CM | POA: Insufficient documentation

## 2020-08-24 DIAGNOSIS — Z859 Personal history of malignant neoplasm, unspecified: Secondary | ICD-10-CM | POA: Insufficient documentation

## 2020-08-24 DIAGNOSIS — R300 Dysuria: Secondary | ICD-10-CM | POA: Diagnosis not present

## 2020-08-24 DIAGNOSIS — Z79899 Other long term (current) drug therapy: Secondary | ICD-10-CM | POA: Diagnosis not present

## 2020-08-24 DIAGNOSIS — R197 Diarrhea, unspecified: Secondary | ICD-10-CM | POA: Insufficient documentation

## 2020-08-24 DIAGNOSIS — I129 Hypertensive chronic kidney disease with stage 1 through stage 4 chronic kidney disease, or unspecified chronic kidney disease: Secondary | ICD-10-CM | POA: Diagnosis not present

## 2020-08-24 DIAGNOSIS — R1031 Right lower quadrant pain: Secondary | ICD-10-CM | POA: Diagnosis not present

## 2020-08-24 DIAGNOSIS — Z951 Presence of aortocoronary bypass graft: Secondary | ICD-10-CM | POA: Insufficient documentation

## 2020-08-24 DIAGNOSIS — R1084 Generalized abdominal pain: Secondary | ICD-10-CM

## 2020-08-24 DIAGNOSIS — Z96653 Presence of artificial knee joint, bilateral: Secondary | ICD-10-CM | POA: Insufficient documentation

## 2020-08-24 DIAGNOSIS — Z86711 Personal history of pulmonary embolism: Secondary | ICD-10-CM | POA: Diagnosis not present

## 2020-08-24 DIAGNOSIS — R109 Unspecified abdominal pain: Secondary | ICD-10-CM | POA: Diagnosis not present

## 2020-08-24 DIAGNOSIS — Z794 Long term (current) use of insulin: Secondary | ICD-10-CM | POA: Insufficient documentation

## 2020-08-24 LAB — COMPREHENSIVE METABOLIC PANEL
ALT: 27 U/L (ref 0–44)
AST: 65 U/L — ABNORMAL HIGH (ref 15–41)
Albumin: 2.1 g/dL — ABNORMAL LOW (ref 3.5–5.0)
Alkaline Phosphatase: 65 U/L (ref 38–126)
Anion gap: 7 (ref 5–15)
BUN: 20 mg/dL (ref 8–23)
CO2: 22 mmol/L (ref 22–32)
Calcium: 8 mg/dL — ABNORMAL LOW (ref 8.9–10.3)
Chloride: 105 mmol/L (ref 98–111)
Creatinine, Ser: 1.42 mg/dL — ABNORMAL HIGH (ref 0.61–1.24)
GFR, Estimated: 52 mL/min — ABNORMAL LOW (ref >60)
Glucose, Bld: 164 mg/dL — ABNORMAL HIGH (ref 70–99)
Potassium: 4.8 mmol/L (ref 3.5–5.1)
Sodium: 134 mmol/L — ABNORMAL LOW (ref 135–145)
Total Bilirubin: 0.5 mg/dL (ref 0.3–1.2)
Total Protein: 5.1 g/dL — ABNORMAL LOW (ref 6.5–8.1)

## 2020-08-24 LAB — CBC WITH DIFFERENTIAL/PLATELET
Abs Immature Granulocytes: 0.31 10*3/uL — ABNORMAL HIGH (ref 0.00–0.07)
Basophils Absolute: 0.1 10*3/uL (ref 0.0–0.1)
Basophils Relative: 1 %
Eosinophils Absolute: 0.1 10*3/uL (ref 0.0–0.5)
Eosinophils Relative: 1 %
HCT: 27 % — ABNORMAL LOW (ref 39.0–52.0)
Hemoglobin: 8.2 g/dL — ABNORMAL LOW (ref 13.0–17.0)
Immature Granulocytes: 4 %
Lymphocytes Relative: 22 %
Lymphs Abs: 1.9 10*3/uL (ref 0.7–4.0)
MCH: 27.4 pg (ref 26.0–34.0)
MCHC: 30.4 g/dL (ref 30.0–36.0)
MCV: 90.3 fL (ref 80.0–100.0)
Monocytes Absolute: 1 10*3/uL (ref 0.1–1.0)
Monocytes Relative: 12 %
Neutro Abs: 5.2 10*3/uL (ref 1.7–7.7)
Neutrophils Relative %: 60 %
Platelets: 714 10*3/uL — ABNORMAL HIGH (ref 150–400)
RBC: 2.99 MIL/uL — ABNORMAL LOW (ref 4.22–5.81)
RDW: 15.9 % — ABNORMAL HIGH (ref 11.5–15.5)
WBC: 8.5 10*3/uL (ref 4.0–10.5)
nRBC: 0 % (ref 0.0–0.2)

## 2020-08-24 LAB — PROTIME-INR
INR: 2.1 — ABNORMAL HIGH (ref 0.8–1.2)
Prothrombin Time: 22.6 seconds — ABNORMAL HIGH (ref 11.4–15.2)

## 2020-08-24 MED ORDER — SODIUM CHLORIDE 0.9 % IV BOLUS
1000.0000 mL | Freq: Once | INTRAVENOUS | Status: AC
  Administered 2020-08-24: 1000 mL via INTRAVENOUS

## 2020-08-24 MED ORDER — LOPERAMIDE HCL 2 MG PO CAPS: 2.0000 mg | ORAL_CAPSULE | Freq: Four times a day (QID) | ORAL | 0 refills | Status: DC | PRN

## 2020-08-24 MED ORDER — IOHEXOL 300 MG/ML  SOLN
100.0000 mL | Freq: Once | INTRAMUSCULAR | Status: AC | PRN
  Administered 2020-08-24: 100 mL via INTRAVENOUS

## 2020-08-24 MED ORDER — LOPERAMIDE HCL 2 MG PO CAPS
4.0000 mg | ORAL_CAPSULE | Freq: Once | ORAL | Status: AC
  Administered 2020-08-24: 4 mg via ORAL
  Filled 2020-08-24: qty 2

## 2020-08-24 NOTE — Discharge Instructions (Signed)
Call your primary care doctor or specialist as discussed in the next 2-3 days.   Return immediately back to the ER if:  Your symptoms worsen within the next 12-24 hours. You develop new symptoms such as new fevers, persistent vomiting, new pain, shortness of breath, or new weakness or numbness, or if you have any other concerns.  

## 2020-08-24 NOTE — ED Provider Notes (Signed)
[facility] EMERGENCY DEPARTMENT Provider Note   CSN: [id number] Arrival date & time: 08/24/20  1717     History Chief Complaint  Patient presents with  . Diarrhea    Patrick Miranda is a 75 y.o. male.  Patient presents with persistent diarrhea.  He states symptoms been going on for 2 or 3 weeks.  He has about 2 or 3 episodes a day nonbloody.  He seen his primary care doctor who sent stool cultures and test.  Patient was told it was negative for C. difficile.  Additional tests were sent and still pending results.  Patient presented to the ER because he started develop right lower quadrant pain in addition scribes aching nonradiating..  Denies any fevers or cough.        Past Medical History:  Diagnosis Date  . Arthritis   . 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 Cancer (HCC)    Skin  . Cerebral artery disease   . Coronary artery disease   . COVID-07 Dec 2019  . COVID-19 virus infection 12/02/2019  . Depression   . Essential hypertension   . Hernia, umbilical   . HOH (hard of hearing)   . Hyperlipidemia   . NSTEMI (non-ST elevated myocardial infarction) ([place])   . OSA on CPAP   . Pulmonary emboli [facility])    May 2021  . Type 2 diabetes mellitus [facility])     Patient Active Problem List   Diagnosis Date Noted  . Thrombus in heart chamber 08/13/2020  . Abdominal pain 08/03/2020  . Chronic kidney disease, stage 3a ([place]) 08/03/2020  . Mixed diabetic hyperlipidemia associated with type 2 diabetes mellitus ([place]) 08/03/2020  . Uncontrolled type 2 diabetes mellitus with hyperglycemia, with long-term current use of insulin ([place]) 08/03/2020  . Foodborne gastroenteritis 08/03/2020  . SIRS (systemic inflammatory response syndrome) ([place]) 08/03/2020  . Postop check 07/25/2020  . Hx of CABG 07/09/2020  . Coronary artery disease involving native heart 07/04/2020  . Abnormal nuclear stress test   . Multiple subsegmental pulmonary emboli without acute cor pulmonale (HCC)   . AF  (paroxysmal atrial fibrillation) ([place])   . Hypersomnia 08/30/2019  . History of adenomatous polyp of colon 05/06/2018  . Vasomotor rhinitis 07/18/2017  . Arthritis of knee, right 01/06/2014  . Brainstem stroke ([place]) 09/07/2011    Class: Acute  . Dysarthria 09/05/2011    Class: Acute  . Essential hypertension 09/05/2011    Class: Chronic  . OSA on CPAP 09/05/2011    Class: Chronic  . Anemia 09/05/2011    Class: Chronic    Past Surgical History:  Procedure Laterality Date  . COLONOSCOPY W/ POLYPECTOMY    . CORONARY ARTERY BYPASS GRAFT N/A 07/09/2020   Procedure: CORONARY ARTERY BYPASS GRAFTING (CABG) TIMES THREE , USING LEFT INTERNAL MAMMARY ARTERY AND RIGHT LEG GREATER SAPHENOUS VEIN HARVESTED ENDOSCOPICALLY;  Surgeon: Ivin Poot, MD;  Location: [place];  Service: Open Heart Surgery;  Laterality: N/A;  . LEFT HEART CATH AND CORONARY ANGIOGRAPHY N/A 07/04/2020   Procedure: LEFT HEART CATH AND CORONARY ANGIOGRAPHY;  Surgeon: Troy Sine, MD;  Location: [place] CV LAB;  Service: Cardiovascular;  Laterality: N/A;  . TEE WITHOUT CARDIOVERSION N/A 07/09/2020   Procedure: TRANSESOPHAGEAL ECHOCARDIOGRAM (TEE);  Surgeon: Prescott Gum, Collier Salina, MD;  Location: [place];  Service: Open Heart Surgery;  Laterality: N/A;  . TOTAL KNEE ARTHROPLASTY Left 04/22/2013   Procedure: TOTAL KNEE ARTHROPLASTY- left;  Surgeon: Augustin Schooling, MD;  Location: [place];  Service: Orthopedics;  Laterality: Left;  with femoral block  . TOTAL KNEE ARTHROPLASTY Right 01/06/2014   Procedure: RIGHT TOTAL KNEE ARTHROPLASTY;  Surgeon: Augustin Schooling, MD;  Location: [place];  Service: Orthopedics;  Laterality: Right;       Family History  Problem Relation Age of Onset  . CAD Brother        Premature    Social History   Tobacco Use  . Smoking status: Never Smoker  . Smokeless tobacco: Never Used  Vaping Use  . Vaping Use: Never used  Substance Use Topics  . Alcohol use: No  . Drug use: No    Home  Medications Prior to Admission medications   Medication Sig Start Date End Date Taking? Authorizing Provider  acetaminophen (TYLENOL) 325 MG tablet Take 2 tablets (650 mg total) by mouth every 6 (six) hours as needed for mild pain or headache (or Fever >/= 101). 08/10/20  Yes Domenic Polite, MD  aspirin EC 325 MG EC tablet Take 1 tablet (325 mg total) by mouth daily. 07/16/20  Yes Gold, Wayne E, PA-C  Empagliflozin-metFORMIN HCl 11-998 MG TABS Take 1 tablet by mouth 2 (two) times daily.   Yes [provider]  fenofibrate 160 MG tablet Take 160 mg by mouth daily.   Yes [provider]  HUMALOG KWIKPEN 100 UNIT/ML KwikPen Inject 8 Units into the skin with breakfast, with lunch, and with evening meal. 04/27/18  Yes [provider]  hyoscyamine (ANASPAZ) 0.125 MG TBDP disintergrating tablet Place 0.125 mg under the tongue every 6 (six) hours as needed for cramping.   Yes [provider]  hyoscyamine (LEVBID) 0.375 MG 12 hr tablet Take 0.375 mg by mouth at bedtime. 08/16/20 09/15/20 Yes [provider]  loperamide (IMODIUM) 2 MG capsule Take 1 capsule (2 mg total) by mouth 4 (four) times daily as needed for up to 5 doses for diarrhea or loose stools. 08/24/20  Yes Luna Fuse, MD  metoprolol tartrate (LOPRESSOR) 25 MG tablet Take 1 tablet (25 mg total) by mouth 2 (two) times daily. 07/16/20  Yes Gold, Wayne E, PA-C  rosuvastatin (CRESTOR) 20 MG tablet Take 1 tablet (20 mg total) by mouth daily. 07/16/20  Yes Gold, Patrick Jupiter E, PA-C  tamsulosin (FLOMAX) 0.4 MG CAPS capsule Take 1 capsule (0.4 mg total) by mouth daily. 08/11/20  Yes Domenic Polite, MD  TOUJEO SOLOSTAR 300 UNIT/ML Solostar Pen Inject 20 Units into the skin daily. 08/10/20  Yes Domenic Polite, MD  warfarin (COUMADIN) 5 MG tablet Take 7.5mg (1.5 tabs)  nightly on Friday and Saturday and 1 tab on Sunday and then based on INR check on Monday Patient taking differently: Take 5 mg by mouth See admin  instructions. Take 2.5 nightly on Thursday,Friday,Sunday and 1 tab on Saturday and then based on INR check on Monday 08/10/20  Yes Domenic Polite, MD  BD PEN NEEDLE NANO U/F 32G X 4 MM MISC 1 each by Other route daily.  03/19/18   [provider]  finasteride (PROSCAR) 5 MG tablet Take 5 mg by mouth daily. Patient not taking: Reported on 08/24/2020 08/17/20   [provider]  metroNIDAZOLE (FLAGYL) 500 MG tablet Take 500 mg by mouth 3 (three) times daily. Patient not taking: Reported on 08/24/2020 08/20/20   [provider]  [facility] VERIO test strip 1 each by Other route in the morning, at noon, and at bedtime.  03/20/18   [provider]  traMADol Veatrice Bourbon) 50  MG tablet Take 1 tablet (50 mg total) by mouth every 6 (six) hours as needed for moderate pain. Patient not taking: Reported on 08/24/2020 07/16/20   John Giovanni, PA-C    Allergies    Lipitor [atorvastatin]  Review of Systems   Review of Systems  Constitutional: Negative for fever.  HENT: Negative for ear pain and sore throat.   Eyes: Negative for pain.  Respiratory: Negative for cough.   Cardiovascular: Negative for chest pain.  Gastrointestinal: Positive for abdominal pain.  Genitourinary: Negative for flank pain.  Musculoskeletal: Negative for back pain.  Skin: Negative for color change and rash.  Neurological: Negative for syncope.  All other systems reviewed and are negative.   Physical Exam Updated Vital Signs BP 111/60   Pulse 99   Temp 98.5 F (36.9 C)   Resp 16   Ht 5' 10.5" (1.791 m)   Wt 68.9 kg   SpO2 98%   BMI 21.50 kg/m   Physical Exam Constitutional:      General: He is not in acute distress.    Appearance: He is well-developed.  HENT:     Head: Normocephalic.     Nose: Nose normal.  Eyes:     Extraocular Movements: Extraocular movements intact.  Cardiovascular:     Rate and Rhythm: Normal rate.  Pulmonary:     Effort: Pulmonary effort is normal.  Skin:     Coloration: Skin is not jaundiced.  Neurological:     Mental Status: He is alert. Mental status is at baseline.     ED Results / Procedures / Treatments   Labs (all labs ordered are listed, but only abnormal results are displayed) Labs Reviewed  COMPREHENSIVE METABOLIC PANEL - Abnormal; Notable for the following components:      Result Value   Sodium 134 (*)    Glucose, Bld 164 (*)    Creatinine, Ser 1.42 (*)    Calcium 8.0 (*)    Total Protein 5.1 (*)    Albumin 2.1 (*)    AST 65 (*)    GFR, Estimated 52 (*)    All other components within normal limits  CBC WITH DIFFERENTIAL/PLATELET - Abnormal; Notable for the following components:   RBC 2.99 (*)    Hemoglobin 8.2 (*)    HCT 27.0 (*)    RDW 15.9 (*)    Platelets 714 (*)    Abs Immature Granulocytes 0.31 (*)    All other components within normal limits  PROTIME-INR - Abnormal; Notable for the following components:   Prothrombin Time 22.6 (*)    INR 2.1 (*)    All other components within normal limits  C DIFFICILE QUICK SCREEN W PCR REFLEX    EKG None  Radiology CT Head Wo Contrast  Result Date: 08/24/2020 CLINICAL DATA:  Dysuria, diarrhea, right inguinal pain, recent fall out of bed EXAM: CT HEAD WITHOUT CONTRAST TECHNIQUE: Contiguous axial images were obtained from the base of the skull through the vertex without intravenous contrast. COMPARISON:  09/05/2011 FINDINGS: Brain: There are confluent hypodensities throughout the periventricular white matter, most compatible with chronic small vessel ischemic change. Focal encephalomalacia within the right parietal cortex consistent with prior infarct. There is hypodensity within the left frontal cortex, compatible with age indeterminate infarct, favor subacute to chronic. No evidence of acute hemorrhage. The lateral ventricles and remaining midline structures are unremarkable. No acute extra-axial fluid collections. No mass effect. Vascular: Extensive atherosclerosis.  No  hyperdense vessel. Skull: Normal. Negative for fracture  or focal lesion. Sinuses/Orbits: No acute finding. Other: None. IMPRESSION: 1. Age-indeterminate left frontal cortical infarct, favor subacute to chronic. 2. Chronic ischemic changes within the right parietal cortex and throughout the periventricular white matter. 3. No acute intracranial hemorrhage. Electronically Signed   By: Randa Ngo M.D.   On: 08/24/2020 23:07   CT Abdomen Pelvis W Contrast  Result Date: 08/24/2020 CLINICAL DATA:  Right side abdominal pain EXAM: CT ABDOMEN AND PELVIS WITH CONTRAST TECHNIQUE: Multidetector CT imaging of the abdomen and pelvis was performed using the standard protocol following bolus administration of intravenous contrast. CONTRAST:  137mL OMNIPAQUE IOHEXOL 300 MG/ML  SOLN COMPARISON:  08/06/2020 FINDINGS: Lower chest: No acute abnormality. Hepatobiliary: Small layering gallstones within the gallbladder. No focal hepatic abnormality. Pancreas: No focal abnormality or ductal dilatation. Spleen: No focal abnormality.  Normal size. Adrenals/Urinary Tract: Bilateral renal parapelvic cysts. No hydronephrosis. Punctate nonobstructing stone in the midpole of the left kidney. Adrenal glands and bladder unremarkable. Stomach/Bowel: There are small bowel loops which are mildly thick walled in the left pelvis and mid pelvis which could reflect enteritis. Stomach and large bowel grossly unremarkable. Vascular/Lymphatic: Aortic atherosclerosis. No evidence of aneurysm or adenopathy. Reproductive: Mildly enlarged prostate. Other: No free fluid or free air. Umbilical hernia containing fat, unchanged. Musculoskeletal: No acute bony abnormality. IMPRESSION: Mild wall thickening within small bowel loops in the left and mid pelvis could reflect enteritis. Cholelithiasis.  No CT evidence for acute cholecystitis. Aortic atherosclerosis. Stable small umbilical hernia containing fat. Electronically Signed   By: Rolm Baptise M.D.   On:  08/24/2020 23:14    Procedures Procedures   Medications Ordered in ED Medications  loperamide (IMODIUM) capsule 4 mg (has no administration in time range)  sodium chloride 0.9 % bolus 1,000 mL (0 mLs Intravenous Stopped 08/24/20 1918)  iohexol (OMNIPAQUE) 300 MG/ML solution 100 mL (100 mLs Intravenous Contrast Given 08/24/20 2238)    ED Course  I have reviewed the triage vital signs and the nursing notes.  Pertinent labs & imaging results that were available during my care of the patient were reviewed by me and considered in my medical decision making (see chart for details).    MDM Rules/Calculators/A&P                          Labs show normal white count hemoglobin 8.2 consistent with baseline hemoglobins in the past.  CT imaging pursued with no evidence of emergent pathology other than enteritis.  Patient given IV fluid hydration here and Imodium.  Given his C. difficile results was negative per patient.  I did order repeat C. difficile orders here, however he has been in the ER several hours and still unable to produce any stool.  Patient advised close outpatient follow-up with his doctor persistently.  Advised immediate return if he has fevers worsening pain or any additional concerns.   Final Clinical Impression(s) / ED Diagnoses Final diagnoses:  Diarrhea, unspecified type  Generalized abdominal pain    Rx / DC Orders ED Discharge Orders         Ordered    loperamide (IMODIUM) 2 MG capsule  4 times daily PRN        08/24/20 2342           Luna Fuse, MD 08/24/20 2342

## 2020-08-24 NOTE — ED Triage Notes (Signed)
Pt to er, pt states that he has been having diarrhea for the past several seeks, states that he has about 2-3 episodes a day, states that he has been to see his pmd and they do not have dx at this time, states that his pmd called him and told him to come to the er.

## 2020-08-27 ENCOUNTER — Ambulatory Visit (INDEPENDENT_AMBULATORY_CARE_PROVIDER_SITE_OTHER): Payer: Medicare HMO | Admitting: *Deleted

## 2020-08-27 DIAGNOSIS — E1122 Type 2 diabetes mellitus with diabetic chronic kidney disease: Secondary | ICD-10-CM | POA: Diagnosis not present

## 2020-08-27 DIAGNOSIS — Z5181 Encounter for therapeutic drug level monitoring: Secondary | ICD-10-CM | POA: Diagnosis not present

## 2020-08-27 DIAGNOSIS — I255 Ischemic cardiomyopathy: Secondary | ICD-10-CM | POA: Diagnosis not present

## 2020-08-27 DIAGNOSIS — I5022 Chronic systolic (congestive) heart failure: Secondary | ICD-10-CM | POA: Diagnosis not present

## 2020-08-27 DIAGNOSIS — I513 Intracardiac thrombosis, not elsewhere classified: Secondary | ICD-10-CM | POA: Diagnosis not present

## 2020-08-27 DIAGNOSIS — I480 Paroxysmal atrial fibrillation: Secondary | ICD-10-CM

## 2020-08-27 DIAGNOSIS — I2510 Atherosclerotic heart disease of native coronary artery without angina pectoris: Secondary | ICD-10-CM | POA: Diagnosis not present

## 2020-08-27 DIAGNOSIS — N1831 Chronic kidney disease, stage 3a: Secondary | ICD-10-CM | POA: Diagnosis not present

## 2020-08-27 DIAGNOSIS — E1165 Type 2 diabetes mellitus with hyperglycemia: Secondary | ICD-10-CM | POA: Diagnosis not present

## 2020-08-27 DIAGNOSIS — I130 Hypertensive heart and chronic kidney disease with heart failure and stage 1 through stage 4 chronic kidney disease, or unspecified chronic kidney disease: Secondary | ICD-10-CM | POA: Diagnosis not present

## 2020-08-27 DIAGNOSIS — I252 Old myocardial infarction: Secondary | ICD-10-CM | POA: Diagnosis not present

## 2020-08-27 LAB — POCT INR: INR: 3.8 — AB (ref 2.0–3.0)

## 2020-08-27 NOTE — Discharge Summary (Signed)
Physician Discharge Summary  [place] [id number] DOB: [birth year] DOA: 08/03/2020  PCP: Burnard Bunting, MD  Admit date: 08/03/2020 Discharge date: 08/10/2020  Time spent: 35 minutes  Recommendations for Outpatient Follow-up:  1. Cardiology Ermalinda Barrios 1/25 2. INR check per [facility] on 1/24, stop Lovenox when INR>2   Discharge Diagnoses:    LV thrombus   Abdominal pain Active Problems:   Essential hypertension   AF (paroxysmal atrial fibrillation) (HCC)   Coronary artery disease involving native heart   Chronic kidney disease, stage 3a ([place])   Mixed diabetic hyperlipidemia associated with type 2 diabetes mellitus ([place])   Uncontrolled type 2 diabetes mellitus with hyperglycemia, with long-term current use of insulin ([place])   Foodborne gastroenteritis   SIRS (systemic inflammatory response syndrome) ([place])   Discharge Condition: stable Diet recommendation: DM heart healthy  Filed Weights   08/06/20 1115 08/07/20 2048 08/09/20 2026  Weight: 77.1 kg 81 kg 81 kg    History of present illness:  74/M w medical history significant for CAD/recent CABG 06/2020, obstructive sleep apnea, paroxysmal A. fib, insulin-dependent diabetes type 2, hypertension, chronic kidney disease stage IIIa who presented to [facility] complaining of nausea vomiting and abdominal pain. -morning of admission after eating breakfast fried egg, 22 days old rice and refrigerated previously opened can of peaches he began to experience severe abdominal pain.  Symptoms accompanied by multiple episodes of vomiting.  Symptoms persisted and eventually patient presented to [facility]. -Work-up in the ED noted mild leukocytosis, CT without IV contrast noted moderate thickening involving multiple distal small bowel loops,consistent with enteritis and associated mesenteric inflammatory changes and tiny amount of free fluid.   Hospital Course:   Abdominal Pain, Nausea, Vomiting:  -suspected to be  gastroenteritis, versus mesenteric ischemia secondary to LV thrombus, CT without IV contrast showed small bowel thickening , KUB suggestive of ileus -Seen by general surgery in consultation, recommended bowel rest and supportive care -improved with bowel rest, IVF, antibiotics -tolerating diet and having BMs at the time of discharge  LV thrombus -Recent CABG, echo on 1/17 noted EF of 45% with large basal inferior and inferior septal aneurysm and probable LV thrombus measuring 1.5X 1.5 cm -Currently on full dose Lovenox, started Coumadin 1/19 -Lovenox teaching completed, [place] RN to draw INR on 1/24  Ischemic cardiomyopathy, EF 45% -Clinically appears euvolemic, continue Toprol  Paroxysmal atrial fibrillation -Continue Toprol, Lovenox bridge, started Coumadin 1/19  CKD 3A -Creatinine stable at baseline  Urine retention;  -Started on Flomax.  -Foley catheter removed yesterday, is voiding better now  CAD S/P CABG;  -recent CABG 06/2020. Follow up with Dr Lucianne Lei trigt, CVTS was notified by [facility] of admission  Diabetes type 2;  -CBGs are stable, continue Lantus and sliding scale insulin  Thrombocytosis;  -Continue with aspirin.  -Likely reactive, improving  Estimated body mass index is 24.91 kg/m as calculated from the following:   Height as of this encounter: 5\' 11"  (1.803 m).   Weight as of this encounter: 81 kg.   Discharge Exam: Vitals:   08/10/20 1024 08/10/20 1046  BP: 140/70 (!) 141/69  Pulse: (!) 102 100  Resp:  19  Temp:  98.3 F (36.8 C)  SpO2:  97%    General: AAOx3 Cardiovascular: S1S2/RRR Respiratory: CTAB  Discharge Instructions   Discharge Instructions    Diet - low sodium heart healthy   Complete by: As directed    Diet Carb Modified   Complete by: As directed  Increase activity slowly   Complete by: As directed    No wound care   Complete by: As directed      Allergies as of 08/10/2020      Reactions   Lipitor  [atorvastatin] Other (See Comments)   Muscle pain      Medication List    TAKE these medications   acetaminophen 325 MG tablet Commonly known as: TYLENOL Take 2 tablets (650 mg total) by mouth every 6 (six) hours as needed for mild pain or headache (or Fever >/= 101).   aspirin 325 MG EC tablet Take 1 tablet (325 mg total) by mouth daily.   BD Pen Needle Nano U/F 32G X 4 MM Misc Generic drug: Insulin Pen Needle 1 each by Other route daily.   Empagliflozin-metFORMIN HCl 11-998 MG Tabs Take 1 tablet by mouth 2 (two) times daily.   fenofibrate 160 MG tablet Take 160 mg by mouth daily.   HumaLOG KwikPen 100 UNIT/ML KwikPen Generic drug: insulin lispro Inject 8 Units into the skin with breakfast, with lunch, and with evening meal.   metoprolol tartrate 25 MG tablet Commonly known as: LOPRESSOR Take 1 tablet (25 mg total) by mouth 2 (two) times daily.   OneTouch Verio test strip Generic drug: glucose blood 1 each by Other route in the morning, at noon, and at bedtime.   rosuvastatin 20 MG tablet Commonly known as: CRESTOR Take 1 tablet (20 mg total) by mouth daily.   tamsulosin 0.4 MG Caps capsule Commonly known as: FLOMAX Take 1 capsule (0.4 mg total) by mouth daily.   Toujeo SoloStar 300 UNIT/ML Solostar Pen Generic drug: insulin glargine (1 Unit Dial) Inject 20 Units into the skin daily. What changed: how much to take   traMADol 50 MG tablet Commonly known as: ULTRAM Take 1 tablet (50 mg total) by mouth every 6 (six) hours as needed for moderate pain.   warfarin 5 MG tablet Commonly known as: Coumadin Take as directed. If you are unsure how to take this medication, talk to your nurse or doctor. Original instructions: Take 7.5mg (1.5 tabs)  nightly on Friday and Saturday and 1 tab on Sunday and then based on INR check on Monday      Allergies  Allergen Reactions  . Lipitor [Atorvastatin] Other (See Comments)    Muscle pain    Follow-up Information     Imogene Burn, PA-C Follow up.   Specialty: Cardiology Why: Office visit scheduled for 08/14/2020 at 9:45am with Ermalinda Barrios, one of our office's PAs. Please arrive 15 minutes early for check-in. Contact information: [place] [address] [phone]        [facility] [place] Follow up.   Why: For home health services INR check on Monday 1/24 Contact information: [place] [address] [phone]        Burnard Bunting, MD. Schedule an appointment as soon as possible for a visit in 1 week(s).   Specialty: Internal Medicine Contact information: [place] [address] [phone]                The results of significant diagnostics from this hospitalization (including imaging, microbiology, ancillary and laboratory) are listed below for reference.    Significant Diagnostic Studies: DG Abd 1 View  Result Date: 08/05/2020 CLINICAL DATA:  Insert epic study no EXAM: ABDOMEN - 1 VIEW COMPARISON:  Abdominal radiograph 08/04/2020 FINDINGS: Persistent gaseous distension of both small and large bowel  loops, similar to prior, most likely representing ileus. No pneumatosis or supine evidence of free air. No unexpected radiopaque calcification. No acute finding in the visualized skeleton. IMPRESSION: Persistent gaseous distension of both small and large bowel loops, similar to prior, most likely representing ileus. Electronically Signed   By: Audie Pinto M.D.   On: 08/05/2020 10:07   DG Abd 1 View  Result Date: 08/04/2020 CLINICAL DATA:  Abdominal pain and vomiting EXAM: ABDOMEN - 1 VIEW COMPARISON:  08/03/2013 CT abdomen/pelvis. FINDINGS: A few mildly dilated small bowel loops throughout the central and left abdomen, up to 3.6 cm diameter, new since CT from 1 day prior. New prominent gaseous distention of the colon in the right lower quadrant, probably the sigmoid colon. No evidence of pneumatosis or  pneumoperitoneum. Mild diffuse colonic stool. No radiopaque nephrolithiasis. Moderate degenerative changes throughout the lumbar spine. Excreted contrast in the urinary bladder. IMPRESSION: Prominent gaseous distention of the colon in the right lower quadrant, probably the sigmoid colon. A few mildly dilated small bowel loops throughout the central and left abdomen. These findings are new since CT from 1 day prior, favoring a mild adynamic ileus, with developing partial distal small bowel obstruction not entirely excluded. Electronically Signed   By: Ilona Sorrel M.D.   On: 08/04/2020 10:39   CT Head Wo Contrast  Result Date: 08/24/2020 CLINICAL DATA:  Dysuria, diarrhea, right inguinal pain, recent fall out of bed EXAM: CT HEAD WITHOUT CONTRAST TECHNIQUE: Contiguous axial images were obtained from the base of the skull through the vertex without intravenous contrast. COMPARISON:  09/05/2011 FINDINGS: Brain: There are confluent hypodensities throughout the periventricular white matter, most compatible with chronic small vessel ischemic change. Focal encephalomalacia within the right parietal cortex consistent with prior infarct. There is hypodensity within the left frontal cortex, compatible with age indeterminate infarct, favor subacute to chronic. No evidence of acute hemorrhage. The lateral ventricles and remaining midline structures are unremarkable. No acute extra-axial fluid collections. No mass effect. Vascular: Extensive atherosclerosis.  No hyperdense vessel. Skull: Normal. Negative for fracture or focal lesion. Sinuses/Orbits: No acute finding. Other: None. IMPRESSION: 1. Age-indeterminate left frontal cortical infarct, favor subacute to chronic. 2. Chronic ischemic changes within the right parietal cortex and throughout the periventricular white matter. 3. No acute intracranial hemorrhage. Electronically Signed   By: Randa Ngo M.D.   On: 08/24/2020 23:07   CT Abdomen Pelvis W Contrast  Result  Date: 08/24/2020 CLINICAL DATA:  Right side abdominal pain EXAM: CT ABDOMEN AND PELVIS WITH CONTRAST TECHNIQUE: Multidetector CT imaging of the abdomen and pelvis was performed using the standard protocol following bolus administration of intravenous contrast. CONTRAST:  17mL OMNIPAQUE IOHEXOL 300 MG/ML  SOLN COMPARISON:  08/06/2020 FINDINGS: Lower chest: No acute abnormality. Hepatobiliary: Small layering gallstones within the gallbladder. No focal hepatic abnormality. Pancreas: No focal abnormality or ductal dilatation. Spleen: No focal abnormality.  Normal size. Adrenals/Urinary Tract: Bilateral renal parapelvic cysts. No hydronephrosis. Punctate nonobstructing stone in the midpole of the left kidney. Adrenal glands and bladder unremarkable. Stomach/Bowel: There are small bowel loops which are mildly thick walled in the left pelvis and mid pelvis which could reflect enteritis. Stomach and large bowel grossly unremarkable. Vascular/Lymphatic: Aortic atherosclerosis. No evidence of aneurysm or adenopathy. Reproductive: Mildly enlarged prostate. Other: No free fluid or free air. Umbilical hernia containing fat, unchanged. Musculoskeletal: No acute bony abnormality. IMPRESSION: Mild wall thickening within small bowel loops in the left and mid pelvis could reflect enteritis. Cholelithiasis.  No CT evidence  for acute cholecystitis. Aortic atherosclerosis. Stable small umbilical hernia containing fat. Electronically Signed   By: Rolm Baptise M.D.   On: 08/24/2020 23:14   CT ABDOMEN PELVIS W CONTRAST  Result Date: 08/03/2020 CLINICAL DATA:  Acute right lower quadrant abdominal pain. EXAM: CT ABDOMEN AND PELVIS WITH CONTRAST TECHNIQUE: Multidetector CT imaging of the abdomen and pelvis was performed using the standard protocol following bolus administration of intravenous contrast. CONTRAST:  132mL OMNIPAQUE IOHEXOL 300 MG/ML  SOLN COMPARISON:  None. FINDINGS: Lower chest: No acute abnormality. Hepatobiliary: No focal  liver abnormality is seen. No gallstones, gallbladder wall thickening, or biliary dilatation. Pancreas: Unremarkable. No pancreatic ductal dilatation or surrounding inflammatory changes. Spleen: Normal in size without focal abnormality. Adrenals/Urinary Tract: Adrenal glands appear normal. Bilateral parapelvic cysts are noted. No hydronephrosis or renal obstruction is noted. No renal or ureteral calculi are noted. Urinary bladder is unremarkable. Stomach/Bowel: Stomach is within normal limits. Appendix appears normal. No evidence of bowel wall thickening, distention, or inflammatory changes. Vascular/Lymphatic: Aortic atherosclerosis. No enlarged abdominal or pelvic lymph nodes. Reproductive: Prostate is unremarkable. Other: Small fat containing periumbilical hernia is noted. No ascites is noted. Musculoskeletal: No acute or significant osseous findings. IMPRESSION: Aortic atherosclerosis. Small fat containing periumbilical hernia. No acute abnormality seen in the abdomen or pelvis. Aortic Atherosclerosis (ICD10-I70.0). Electronically Signed   By: Marijo Conception M.D.   On: 08/03/2020 20:09   DG Chest Port 1 View  Result Date: 08/03/2020 CLINICAL DATA:  Questionable sepsis, sudden onset abdominal pain of a cyst. Recent CABG surgery last month. Fever EXAM: PORTABLE CHEST 1 VIEW COMPARISON:  Chest x-ray 07/25/2020. CT chest 07/06/2020. FINDINGS: The heart size and mediastinal contours are unchanged. Sternotomy wires with associated cardiac surgical changes noted. No focal consolidation. No pulmonary edema. No pleural effusion. No pneumothorax. No acute osseous abnormality. IMPRESSION: No active disease. Electronically Signed   By: Iven Finn M.D.   On: 08/03/2020 17:32   ECHOCARDIOGRAM COMPLETE  Result Date: 08/06/2020    ECHOCARDIOGRAM REPORT   Patient Name:   Patrick Miranda Date of Exam: 08/06/2020 Medical Rec #:  [id number]          Height:       71.0 in Accession #:    [id number]         Weight:        170.0 lb Date of Birth:  1946/02/15          BSA:          1.968 m Patient Age:    78 years           BP:           126/65 mmHg Patient Gender: M                  HR:           90 bpm. Exam Location:  Inpatient Procedure: 2D Echo, 3D Echo, Color Doppler and Cardiac Doppler Indications:    Fever  History:        Patient has prior history of Echocardiogram examinations, most                 recent 07/09/2020. Prior CABG, Arrythmias:Atrial Fibrillation;                 Risk Factors:Hypertension, Diabetes, Dyslipidemia and Sleep                 Apnea.  Sonographer:    [place] Referring  Phys: [id number] BELKYS A REGALADO IMPRESSIONS  1. Probable LV apical thrombus. 1.5 x 1.5 cm, with heterogeneous echo texture. Centrally more echo lucent.  2. Left ventricular ejection fraction, by estimation, is 45%. The left ventricle has mildly decreased function. The left ventricle demonstrates regional wall motion abnormalities, large basal inferior and inferoseptal aneurysm, which appears unchanged compared to intraoperative TEE. Left ventricular diastolic parameters are indeterminate.  3. Right ventricular systolic function is moderately reduced. The right ventricular size is normal. There is normal pulmonary artery systolic pressure. The estimated right ventricular systolic pressure is 25.3 mmHg.  4. The mitral valve is grossly normal. Mild mitral valve regurgitation.  5. The aortic valve is grossly normal. Aortic valve regurgitation is mild. No aortic stenosis is present.  6. Aortic dilatation noted. There is borderline dilatation of the ascending aorta, measuring 38 mm.  7. The inferior vena cava is normal in size with greater than 50% respiratory variability, suggesting right atrial pressure of 3 mmHg. Conclusion(s)/Recommendation(s): Critical findings reported to Dr. Jennette Dubinegalado/IM and acknowledged at 4:26 PM 08/06/20. FINDINGS  Left Ventricle: Left ventricular ejection fraction, by estimation, is 45%. The left ventricle has  mildly decreased function. The left ventricle demonstrates regional wall motion abnormalities. The left ventricular internal cavity size was normal in size. There is no left ventricular hypertrophy. Left ventricular diastolic parameters are indeterminate.  LV Wall Scoring: The basal inferolateral segment is aneurysmal. The mid inferolateral segment, apical septal segment, and apical inferior segment are akinetic. The apical lateral segment and apical anterior segment are hypokinetic. Right Ventricle: The right ventricular size is normal. No increase in right ventricular wall thickness. Right ventricular systolic function is moderately reduced. There is normal pulmonary artery systolic pressure. The tricuspid regurgitant velocity is 2.36 m/s, and with an assumed right atrial pressure of 3 mmHg, the estimated right ventricular systolic pressure is 25.3 mmHg. Left Atrium: Left atrial size was normal in size. Right Atrium: Right atrial size was normal in size. Pericardium: Trivial pericardial effusion is present. Mitral Valve: The mitral valve is grossly normal. Mild mitral valve regurgitation. Tricuspid Valve: The tricuspid valve is normal in structure. Tricuspid valve regurgitation is trivial. Aortic Valve: The aortic valve is grossly normal. Aortic valve regurgitation is mild. Aortic regurgitation PHT measures 412 msec. No aortic stenosis is present. Pulmonic Valve: The pulmonic valve was grossly normal. Pulmonic valve regurgitation is trivial. Aorta: Aortic dilatation noted. There is borderline dilatation of the ascending aorta, measuring 38 mm. Venous: The inferior vena cava is normal in size with greater than 50% respiratory variability, suggesting right atrial pressure of 3 mmHg. IAS/Shunts: No atrial level shunt detected by color flow Doppler.  LEFT VENTRICLE PLAX 2D LVIDd:         4.90 cm  Diastology LVIDs:         3.90 cm  LV e' medial:    5.11 cm/s LV PW:         1.20 cm  LV E/e' medial:  15.8 LV IVS:         1.00 cm  LV e' lateral:   10.40 cm/s LVOT diam:     2.00 cm  LV E/e' lateral: 7.8 LV SV:         68 LV SV Index:   35 LVOT Area:     3.14 cm  RIGHT VENTRICLE RV S prime:     6.96 cm/s TAPSE (M-mode): 1.0 cm LEFT ATRIUM             Index  RIGHT ATRIUM           Index LA diam:        4.00 cm 2.03 cm/m  RA Area:     20.80 cm LA Vol (A2C):   39.3 ml 19.97 ml/m RA Volume:   53.60 ml  27.24 ml/m LA Vol (A4C):   63.4 ml 32.22 ml/m LA Biplane Vol: 50.8 ml 25.82 ml/m  AORTIC VALVE LVOT Vmax:   126.00 cm/s LVOT Vmean:  97.600 cm/s LVOT VTI:    0.218 m AI PHT:      412 msec  AORTA Ao Root diam: 3.50 cm Ao Asc diam:  3.80 cm MITRAL VALVE               TRICUSPID VALVE MV Area (PHT): 3.39 cm    TR Peak grad:   22.3 mmHg MV Decel Time: 224 msec    TR Vmax:        236.00 cm/s MV E velocity: 80.60 cm/s MV A velocity: 85.30 cm/s  SHUNTS MV E/A ratio:  0.94        Systemic VTI:  0.22 m                            Systemic Diam: 2.00 cm Cherlynn Kaiser MD Electronically signed by Cherlynn Kaiser MD Signature Date/Time: 08/06/2020/4:34:29 PM    Final    VAS [place] LOWER EXTREMITY VENOUS (DVT)  Result Date: 08/06/2020  Lower Venous DVT Study Indications: Edema.  Comparison Study: No prior venous exams Performing Technologist: Rogelia Rohrer  Examination Guidelines: A complete evaluation includes B-mode imaging, spectral Doppler, color Doppler, and power Doppler as needed of all accessible portions of each vessel. Bilateral testing is considered an integral part of a complete examination. Limited examinations for reoccurring indications may be performed as noted. The reflux portion of the exam is performed with the patient in reverse Trendelenburg.  +---------+---------------+---------+-----------+----------+--------------+ RIGHT    CompressibilityPhasicitySpontaneityPropertiesThrombus Aging +---------+---------------+---------+-----------+----------+--------------+ CFV      Full           Yes      Yes                                  +---------+---------------+---------+-----------+----------+--------------+ SFJ      Full                                                        +---------+---------------+---------+-----------+----------+--------------+ FV Prox  Full           Yes      Yes                                 +---------+---------------+---------+-----------+----------+--------------+ FV Mid   Full           Yes      Yes                                 +---------+---------------+---------+-----------+----------+--------------+ FV DistalFull           Yes      Yes                                 +---------+---------------+---------+-----------+----------+--------------+  PFV      Full                                                        +---------+---------------+---------+-----------+----------+--------------+ POP      Full           Yes      Yes                                 +---------+---------------+---------+-----------+----------+--------------+ PTV      Full                                                        +---------+---------------+---------+-----------+----------+--------------+ PERO     Full                                                        +---------+---------------+---------+-----------+----------+--------------+   +---------+---------------+---------+-----------+----------+--------------+ LEFT     CompressibilityPhasicitySpontaneityPropertiesThrombus Aging +---------+---------------+---------+-----------+----------+--------------+ CFV      Full           Yes      Yes                                 +---------+---------------+---------+-----------+----------+--------------+ SFJ      Full                                                        +---------+---------------+---------+-----------+----------+--------------+ FV Prox  Full           Yes      Yes                                  +---------+---------------+---------+-----------+----------+--------------+ FV Mid   Full           Yes      Yes                                 +---------+---------------+---------+-----------+----------+--------------+ FV DistalFull           Yes      Yes                                 +---------+---------------+---------+-----------+----------+--------------+ PFV      Full                                                        +---------+---------------+---------+-----------+----------+--------------+  POP      Full           Yes      Yes                                 +---------+---------------+---------+-----------+----------+--------------+ PTV      Full                                                        +---------+---------------+---------+-----------+----------+--------------+ PERO     Full                                                        +---------+---------------+---------+-----------+----------+--------------+     Summary: BILATERAL: - No evidence of deep vein thrombosis seen in the lower extremities, bilaterally. - RIGHT: - No cystic structure found in the popliteal fossa.  LEFT: - No cystic structure found in the popliteal fossa.  *See table(s) above for measurements and observations. Electronically signed by Ruta Hinds MD on 08/06/2020 at 5:17:07 PM.    Final    CT CHEST ABDOMEN PELVIS WO CONTRAST  Result Date: 08/06/2020 CLINICAL DATA:  Abdominal pain. Nausea and vomiting. Fever of unknown origin EXAM: CT CHEST, ABDOMEN AND PELVIS WITHOUT CONTRAST TECHNIQUE: Multidetector CT imaging of the chest, abdomen and pelvis was performed following the standard protocol without IV contrast. COMPARISON:  AP CT on 08/03/2020, and chest CT on 07/06/2020 FINDINGS: CT CHEST FINDINGS Cardiovascular: No acute findings. Stable mild pericardial thickening. Aortic and coronary atherosclerotic calcification noted. Prior CABG. Mediastinum/Lymph Nodes: No masses or  pathologically enlarged lymph nodes identified on this unenhanced exam. Lungs/Pleura: Tiny bilateral pleural effusions and dependent bibasilar atelectasis are new since previous study. No evidence of pulmonary consolidation or mass. Central tracheobronchial airways are patent. Musculoskeletal:  No suspicious bone lesions identified. CT ABDOMEN AND PELVIS FINDINGS Hepatobiliary: No masses visualized on this unenhanced exam. High attenuation sludge and possible tiny calculi noted within the gallbladder, however there is no evidence of cholecystitis or biliary ductal dilatation. Pancreas: No mass or inflammatory changes identified on this unenhanced exam. Spleen:  Within normal limits in size. Adrenals/Urinary Tract: A tiny 1-2 mm calculus is seen in the midpole the left kidney. No evidence of ureteral calculi or hydronephrosis. Bilateral renal sinus cysts are again noted. A Foley catheter is seen within the bladder which is collapsed. Stomach/Bowel: Moderate bowel wall thickening is seen involving multiple distal small bowel loops. Soft tissue stranding is seen throughout the small bowel mesentery, with tiny amount of ascites in Morison's pouch and pelvic cul-de-sac. No evidence of bowel obstruction or abscess. Vascular/Lymphatic: No pathologically enlarged lymph nodes identified. No abdominal aortic aneurysm. Aortic atherosclerotic calcification noted. Reproductive:  No masses or other significant abnormality. Other: Stable tiny paraumbilical ventral hernia, which contains only fat. No evidence of herniated bowel loops. Musculoskeletal:  No suspicious bone lesions identified. IMPRESSION: Moderate thickening involving multiple distal small bowel loops, consistent with enteritis. Associated mesenteric inflammatory changes and tiny amount of free fluid. No evidence of abscess. Tiny bilateral pleural effusions and dependent bibasilar atelectasis. Tiny nonobstructing left renal calculus. No evidence of ureteral  calculi or  hydronephrosis. Cholelithiasis and possible gallbladder sludge. No radiographic evidence of cholecystitis. Stable tiny paraumbilical ventral hernia, which contains only fat. Aortic Atherosclerosis (ICD10-I70.0). Electronically Signed   By: Marlaine Hind M.D.   On: 08/06/2020 14:11    Microbiology: No results found for this or any previous visit (from the past 240 hour(s)).   Labs: Basic Metabolic Panel: Recent Labs  Lab 08/24/20 1843  NA 134*  K 4.8  CL 105  CO2 22  GLUCOSE 164*  BUN 20  CREATININE 1.42*  CALCIUM 8.0*   Liver Function Tests: Recent Labs  Lab 08/24/20 1843  AST 65*  ALT 27  ALKPHOS 65  BILITOT 0.5  PROT 5.1*  ALBUMIN 2.1*   No results for input(s): LIPASE, AMYLASE in the last 168 hours. No results for input(s): AMMONIA in the last 168 hours. CBC: Recent Labs  Lab 08/24/20 1843  WBC 8.5  NEUTROABS 5.2  HGB 8.2*  HCT 27.0*  MCV 90.3  PLT 714*   Cardiac Enzymes: No results for input(s): CKTOTAL, CKMB, CKMBINDEX, TROPONINI in the last 168 hours. BNP: BNP (last 3 results) Recent Labs    12/02/19 1558 08/04/20 0237  BNP 439.8* 656.9*    ProBNP (last 3 results) No results for input(s): PROBNP in the last 8760 hours.  CBG: No results for input(s): GLUCAP in the last 168 hours.     Signed:  Domenic Polite MD.  [facility] 08/27/2020, 2:47 PM

## 2020-08-27 NOTE — Patient Instructions (Signed)
Hold warfarin tonight then decrease dose to 1/2 tablet daily Recheck INR on 08/30/20 Orders given to [facility]

## 2020-08-28 DIAGNOSIS — I5022 Chronic systolic (congestive) heart failure: Secondary | ICD-10-CM | POA: Diagnosis not present

## 2020-08-28 DIAGNOSIS — I2510 Atherosclerotic heart disease of native coronary artery without angina pectoris: Secondary | ICD-10-CM | POA: Diagnosis not present

## 2020-08-28 DIAGNOSIS — E1122 Type 2 diabetes mellitus with diabetic chronic kidney disease: Secondary | ICD-10-CM | POA: Diagnosis not present

## 2020-08-28 DIAGNOSIS — R634 Abnormal weight loss: Secondary | ICD-10-CM | POA: Diagnosis not present

## 2020-08-28 DIAGNOSIS — K529 Noninfective gastroenteritis and colitis, unspecified: Secondary | ICD-10-CM | POA: Diagnosis not present

## 2020-08-28 DIAGNOSIS — N1831 Chronic kidney disease, stage 3a: Secondary | ICD-10-CM | POA: Diagnosis not present

## 2020-08-28 DIAGNOSIS — I513 Intracardiac thrombosis, not elsewhere classified: Secondary | ICD-10-CM | POA: Diagnosis not present

## 2020-08-28 DIAGNOSIS — I255 Ischemic cardiomyopathy: Secondary | ICD-10-CM | POA: Diagnosis not present

## 2020-08-28 DIAGNOSIS — R197 Diarrhea, unspecified: Secondary | ICD-10-CM | POA: Diagnosis not present

## 2020-08-28 DIAGNOSIS — E1165 Type 2 diabetes mellitus with hyperglycemia: Secondary | ICD-10-CM | POA: Diagnosis not present

## 2020-08-28 DIAGNOSIS — I252 Old myocardial infarction: Secondary | ICD-10-CM | POA: Diagnosis not present

## 2020-08-28 DIAGNOSIS — I130 Hypertensive heart and chronic kidney disease with heart failure and stage 1 through stage 4 chronic kidney disease, or unspecified chronic kidney disease: Secondary | ICD-10-CM | POA: Diagnosis not present

## 2020-08-30 ENCOUNTER — Ambulatory Visit (INDEPENDENT_AMBULATORY_CARE_PROVIDER_SITE_OTHER): Payer: Medicare HMO | Admitting: *Deleted

## 2020-08-30 DIAGNOSIS — Z5181 Encounter for therapeutic drug level monitoring: Secondary | ICD-10-CM | POA: Diagnosis not present

## 2020-08-30 DIAGNOSIS — I480 Paroxysmal atrial fibrillation: Secondary | ICD-10-CM

## 2020-08-30 DIAGNOSIS — E1165 Type 2 diabetes mellitus with hyperglycemia: Secondary | ICD-10-CM | POA: Diagnosis not present

## 2020-08-30 DIAGNOSIS — E1122 Type 2 diabetes mellitus with diabetic chronic kidney disease: Secondary | ICD-10-CM | POA: Diagnosis not present

## 2020-08-30 DIAGNOSIS — I255 Ischemic cardiomyopathy: Secondary | ICD-10-CM | POA: Diagnosis not present

## 2020-08-30 DIAGNOSIS — I513 Intracardiac thrombosis, not elsewhere classified: Secondary | ICD-10-CM | POA: Diagnosis not present

## 2020-08-30 DIAGNOSIS — I5022 Chronic systolic (congestive) heart failure: Secondary | ICD-10-CM | POA: Diagnosis not present

## 2020-08-30 DIAGNOSIS — N1831 Chronic kidney disease, stage 3a: Secondary | ICD-10-CM | POA: Diagnosis not present

## 2020-08-30 DIAGNOSIS — I252 Old myocardial infarction: Secondary | ICD-10-CM | POA: Diagnosis not present

## 2020-08-30 DIAGNOSIS — I130 Hypertensive heart and chronic kidney disease with heart failure and stage 1 through stage 4 chronic kidney disease, or unspecified chronic kidney disease: Secondary | ICD-10-CM | POA: Diagnosis not present

## 2020-08-30 DIAGNOSIS — I2510 Atherosclerotic heart disease of native coronary artery without angina pectoris: Secondary | ICD-10-CM | POA: Diagnosis not present

## 2020-08-30 LAB — POCT INR: INR: 2.9 (ref 2.0–3.0)

## 2020-08-30 NOTE — Patient Instructions (Signed)
Continue warfarin 1/2 tablet daily Recheck INR on 09/03/20 Orders given to [facility]

## 2020-09-03 ENCOUNTER — Ambulatory Visit: Payer: Self-pay | Admitting: *Deleted

## 2020-09-03 ENCOUNTER — Telehealth: Payer: Self-pay | Admitting: *Deleted

## 2020-09-03 DIAGNOSIS — N1831 Chronic kidney disease, stage 3a: Secondary | ICD-10-CM | POA: Diagnosis not present

## 2020-09-03 DIAGNOSIS — I255 Ischemic cardiomyopathy: Secondary | ICD-10-CM | POA: Diagnosis not present

## 2020-09-03 DIAGNOSIS — I130 Hypertensive heart and chronic kidney disease with heart failure and stage 1 through stage 4 chronic kidney disease, or unspecified chronic kidney disease: Secondary | ICD-10-CM | POA: Diagnosis not present

## 2020-09-03 DIAGNOSIS — I252 Old myocardial infarction: Secondary | ICD-10-CM | POA: Diagnosis not present

## 2020-09-03 DIAGNOSIS — I513 Intracardiac thrombosis, not elsewhere classified: Secondary | ICD-10-CM | POA: Diagnosis not present

## 2020-09-03 DIAGNOSIS — I5022 Chronic systolic (congestive) heart failure: Secondary | ICD-10-CM | POA: Diagnosis not present

## 2020-09-03 DIAGNOSIS — E1165 Type 2 diabetes mellitus with hyperglycemia: Secondary | ICD-10-CM | POA: Diagnosis not present

## 2020-09-03 DIAGNOSIS — E1122 Type 2 diabetes mellitus with diabetic chronic kidney disease: Secondary | ICD-10-CM | POA: Diagnosis not present

## 2020-09-03 DIAGNOSIS — I2510 Atherosclerotic heart disease of native coronary artery without angina pectoris: Secondary | ICD-10-CM | POA: Diagnosis not present

## 2020-09-03 LAB — POCT INR: INR: 4.1 — AB (ref 2.0–3.0)

## 2020-09-03 MED ORDER — WARFARIN SODIUM 2 MG PO TABS: 2.0000 mg | ORAL_TABLET | Freq: Every day | ORAL | 3 refills | Status: DC

## 2020-09-03 MED ORDER — FUROSEMIDE 40 MG PO TABS: ORAL_TABLET | ORAL | 0 refills | Status: DC

## 2020-09-03 NOTE — Telephone Encounter (Signed)
Kristi with [facility] - pt has gained 3lbs since yesterday - swelling in right foot and abdomen - denies SOB HR 99 BP 118/60 - lethargic and pale (hgb 8.2 on 08/24/20)

## 2020-09-03 NOTE — Telephone Encounter (Signed)
Patrick Miranda with [facility] voiced understanding - per Steffanie Dunn pt will have repeat labs done tomorrow ordered by pcp - had f/u appt with urology this week and is scheduled for endoscopy next week

## 2020-09-03 NOTE — Telephone Encounter (Signed)
Noted.  Spoke with nursing who took phone call.  Patient has apparently been "washed out" recently, has pending follow-up.  No diuretics available for as needed use, will prescribe Lasix 40 mg to be taken for weight gain, 2 to 3 pounds in 24 hours

## 2020-09-03 NOTE — Patient Instructions (Signed)
CHANGE WARFARIN TO 2MG  TABLET Hold warfarin tonight then decrease dose to 1 tablet (2mg ) daily Recheck INR on 09/10/20 Orders given to [facility] RN [facility] New Rx sent to [facility]

## 2020-09-04 DIAGNOSIS — I5022 Chronic systolic (congestive) heart failure: Secondary | ICD-10-CM | POA: Diagnosis not present

## 2020-09-04 DIAGNOSIS — E1165 Type 2 diabetes mellitus with hyperglycemia: Secondary | ICD-10-CM | POA: Diagnosis not present

## 2020-09-04 DIAGNOSIS — I130 Hypertensive heart and chronic kidney disease with heart failure and stage 1 through stage 4 chronic kidney disease, or unspecified chronic kidney disease: Secondary | ICD-10-CM | POA: Diagnosis not present

## 2020-09-04 DIAGNOSIS — I252 Old myocardial infarction: Secondary | ICD-10-CM | POA: Diagnosis not present

## 2020-09-04 DIAGNOSIS — I2510 Atherosclerotic heart disease of native coronary artery without angina pectoris: Secondary | ICD-10-CM | POA: Diagnosis not present

## 2020-09-04 DIAGNOSIS — N1831 Chronic kidney disease, stage 3a: Secondary | ICD-10-CM | POA: Diagnosis not present

## 2020-09-04 DIAGNOSIS — E1122 Type 2 diabetes mellitus with diabetic chronic kidney disease: Secondary | ICD-10-CM | POA: Diagnosis not present

## 2020-09-04 DIAGNOSIS — I255 Ischemic cardiomyopathy: Secondary | ICD-10-CM | POA: Diagnosis not present

## 2020-09-04 DIAGNOSIS — D649 Anemia, unspecified: Secondary | ICD-10-CM | POA: Diagnosis not present

## 2020-09-04 DIAGNOSIS — I513 Intracardiac thrombosis, not elsewhere classified: Secondary | ICD-10-CM | POA: Diagnosis not present

## 2020-09-05 NOTE — Progress Notes (Deleted)
Cardiology Office Note    Date:  09/05/2020   ID:  Patrick Miranda, DOB 04-Nov-1945, MRN [id number]   PCP:  Burnard Bunting, MD   [place]  Cardiologist:  Rozann Lesches, MD *** Advanced Practice Provider:  No care team member to display Electrophysiologist:  None  {Press F2 to show EP APP, CHF, sleep or structural heart MD               :[id number]}  { Click here to update then REFRESH NOTE - MD (PCP) or APP (Team Member)  Change PCP Type for MD, Specialty for APP is either Cardiology or Clinical Cardiac Electrophysiology  :[id number]}   No chief complaint on file.   History of Present Illness:  [name] is a 75 y.o. male with a history of CAD s/p CABG x3 in 06/2020, chronic heart failure with mildly reduced EF, paroxysmal atrial fibrillation in the setting of COVID and post CABG, PE related to COVID infection in 11/2019, hypertension, hyperlipidemia, type 2 diabetes, obstructive sleep apnea on CPAP, and depression    Patient was seen in the hospital 08/06/2020 and incidentally found to have an LV thrombus on echo LVEF 45% with large basal inferior and inferoseptal aneurysm and probable LV apical thrombus measuring 1.5 x 1.5 cm.  Likely secondary to aneurysm and remodeling.  He was treated with IV heparin and transition to Coumadin.  Patient echo initially was done because of abdominal pain and high fevers. Possible gastroenteritis versus mesenteric ischemia from LV thrombus.  I saw the patient 08/14/2020 complaining of abdominal discomfort diarrhea not being able to button his pants.  I referred to GI.  INR 08/16/2020 was 8 and Coumadin held.  In the ED 08/24/2020 with recurrent diarrhea, CT unremarkable, creatinine 1.42, hemoglobin 8.2, INR 2.1 Dr. Domenic Polite called in some as needed Lasix 09/03/2020 for weight gain or edema recent INR 4.1 on 09/03/2020    Past Medical History:  Diagnosis Date  . Arthritis   . 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  Cancer (HCC)    Skin  . Cerebral artery disease   . Coronary artery disease   . COVID-07 Dec 2019  . COVID-19 virus infection 12/02/2019  . Depression   . Essential hypertension   . Hernia, umbilical   . HOH (hard of hearing)   . Hyperlipidemia   . NSTEMI (non-ST elevated myocardial infarction) ([place])   . OSA on CPAP   . Pulmonary emboli [facility])    May 2021  . Type 2 diabetes mellitus ([place])     Past Surgical History:  Procedure Laterality Date  . COLONOSCOPY W/ POLYPECTOMY    . CORONARY ARTERY BYPASS GRAFT N/A 07/09/2020   Procedure: CORONARY ARTERY BYPASS GRAFTING (CABG) TIMES THREE , USING LEFT INTERNAL MAMMARY ARTERY AND RIGHT LEG GREATER SAPHENOUS VEIN HARVESTED ENDOSCOPICALLY;  Surgeon: Ivin Poot, MD;  Location: [place];  Service: Open Heart Surgery;  Laterality: N/A;  . LEFT HEART CATH AND CORONARY ANGIOGRAPHY N/A 07/04/2020   Procedure: LEFT HEART CATH AND CORONARY ANGIOGRAPHY;  Surgeon: Troy Sine, MD;  Location: [place] [facility];  Service: Cardiovascular;  Laterality: N/A;  . TEE WITHOUT CARDIOVERSION N/A 07/09/2020   Procedure: TRANSESOPHAGEAL ECHOCARDIOGRAM (TEE);  Surgeon: Prescott Gum, Collier Salina, MD;  Location: [place];  Service: Open Heart Surgery;  Laterality: N/A;  . TOTAL KNEE ARTHROPLASTY Left 04/22/2013   Procedure: TOTAL KNEE ARTHROPLASTY- left;  Surgeon: Augustin Schooling, MD;  Location:  [place] [facility];  Service: Orthopedics;  Laterality: Left;  with femoral block  . TOTAL KNEE ARTHROPLASTY Right 01/06/2014   Procedure: RIGHT TOTAL KNEE ARTHROPLASTY;  Surgeon: Augustin Schooling, MD;  Location: [place];  Service: Orthopedics;  Laterality: Right;    Current Medications: No outpatient medications have been marked as taking for the 09/11/20 encounter (Appointment) with Imogene Burn, PA-C.     Allergies:   Lipitor [atorvastatin]   Social History   Socioeconomic History  . Marital status: Married    Spouse name: Not on file  . Number of children: Not on file  . Years  of education: Not on file  . Highest education level: Not on file  Occupational History  . Not on file  Tobacco Use  . Smoking status: Never Smoker  . Smokeless tobacco: Never Used  Vaping Use  . Vaping Use: Never used  Substance and Sexual Activity  . Alcohol use: No  . Drug use: No  . Sexual activity: Not on file  Other Topics Concern  . Not on file  Social History Narrative  . Not on file   Social Determinants of Health   Financial Resource Strain: Not on file  Food Insecurity: No Food Insecurity  . Worried About Charity fundraiser in the Last Year: Never true  . Ran Out of Food in the Last Year: Never true  Transportation Needs: No Transportation Needs  . Lack of Transportation (Medical): No  . Lack of Transportation (Non-Medical): No  Physical Activity: Not on file  Stress: Not on file  Social Connections: Not on file     Family History:  The patient's ***family history includes CAD in his brother.   ROS:   Please see the history of present illness.    ROS All other systems reviewed and are negative.   PHYSICAL EXAM:   VS:  There were no vitals taken for this visit.  Physical Exam  GEN: Well nourished, well developed, in no acute distress  HEENT: normal  Neck: no JVD, carotid bruits, or masses Cardiac:RRR; no murmurs, rubs, or gallops  Respiratory:  clear to auscultation bilaterally, normal work of breathing GI: soft, nontender, nondistended, + BS Ext: without cyanosis, clubbing, or edema, Good distal pulses bilaterally MS: no deformity or atrophy  Skin: warm and dry, no rash Neuro:  Alert and Oriented x 3, Strength and sensation are intact Psych: euthymic mood, full affect  Wt Readings from Last 3 Encounters:  08/24/20 152 lb (68.9 kg)  08/14/20 169 lb (76.7 kg)  08/09/20 178 lb 10 oz (81 kg)      Studies/Labs Reviewed:   EKG:  EKG is*** ordered today.  The ekg ordered today demonstrates ***  Recent Labs: 07/05/2020: TSH 2.525 08/04/2020: B  Natriuretic Peptide 656.9 08/10/2020: Magnesium 1.9 08/24/2020: ALT 27; BUN 20; Creatinine, Ser 1.42; Hemoglobin 8.2; Platelets 714; Potassium 4.8; Sodium 134   Lipid Panel    Component Value Date/Time   CHOL 122 07/06/2020 0303   TRIG 133 07/06/2020 0303   HDL 27 (L) 07/06/2020 0303   CHOLHDL 4.5 07/06/2020 0303   VLDL 27 07/06/2020 0303   LDLCALC 68 07/06/2020 0303    Additional studies/ records that were reviewed today include:  Echo 08/06/20 IMPRESSIONS     1. Probable LV apical thrombus. 1.5 x 1.5 cm, with heterogeneous echo  texture. Centrally more echo lucent.   2. Left ventricular ejection fraction, by estimation, is 45%. The left  ventricle has mildly decreased function. The  left ventricle demonstrates  regional wall motion abnormalities, large basal inferior and inferoseptal  aneurysm, which appears unchanged  compared to intraoperative TEE. Left ventricular diastolic parameters are  indeterminate.   3. Right ventricular systolic function is moderately reduced. The right  ventricular size is normal. There is normal pulmonary artery systolic  pressure. The estimated right ventricular systolic pressure is 40.9 mmHg.   4. The mitral valve is grossly normal. Mild mitral valve regurgitation.   5. The aortic valve is grossly normal. Aortic valve regurgitation is  mild. No aortic stenosis is present.   6. Aortic dilatation noted. There is borderline dilatation of the  ascending aorta, measuring 38 mm.   7. The inferior vena cava is normal in size with greater than 50%  respiratory variability, suggesting right atrial pressure of 3 mmHg.   Conclusion(s)/Recommendation(s): Critical findings reported to Dr.  Zorita Pang and acknowledged at 4:26 PM 08/06/20.      Left Cardiac Catheterization 07/04/2020:  Ost LAD to Prox LAD lesion is 40% stenosed.  2nd Diag lesion is 90% stenosed.  1st Diag-1 lesion is 95% stenosed.  1st Diag-2 lesion is 90% stenosed with 90% stenosed  side branch in Lat 1st Diag.  Mid LAD-1 lesion is 90% stenosed.  Mid LAD-2 lesion is 70% stenosed.  Lat 2nd Mrg lesion is 95% stenosed.  1st Mrg lesion is 20% stenosed.  Prox RCA lesion is 95% stenosed.  Dist LAD lesion is 50% stenosed.   Severe coronary obstructive disease with significant calcification involving the LAD.  The LAD is diffusely diseased extending from the proximal to mid segment with 40% diffuse proximal stenosis, calcified 90% stenosis on a bend in the vessel in the region of the first and second diagonal vessels with 95% stenoses in the ostium of the first diagonal and 90% in the second diagonal, 70% mid stenosis and 50% mid distal stenoses.   The circumflex vessel has 20% smooth narrowing in the first marginal branch with 95% focal stenosis in a branch of the second marginal vessel.   The RCA has a focal 95% mid stenosis with reduced antegrade distal flow secondary to competitive left-to-right collateralization.   Normal LV function with EF estimated at 55 mm; LVEDP 12 mm Hg.   Recommendation: Surgical consultation for CABG revascularization.  Patient will need to hold Plavix and will need Plavix washout. _______________   Echocardiogram 08/07/2019: Impressions: 1. Probable LV apical thrombus. 1.5 x 1.5 cm, with heterogeneous echo  texture. Centrally more echo lucent.   2. Left ventricular ejection fraction, by estimation, is 45%. The left  ventricle has mildly decreased function. The left ventricle demonstrates  regional wall motion abnormalities, large basal inferior and inferoseptal  aneurysm, which appears unchanged  compared to intraoperative TEE. Left ventricular diastolic parameters are  indeterminate.   3. Right ventricular systolic function is moderately reduced. The right  ventricular size is normal. There is normal pulmonary artery systolic  pressure. The estimated right ventricular systolic pressure is 81.1 mmHg.   4. The mitral valve is grossly  normal. Mild mitral valve regurgitation.   5. The aortic valve is grossly normal. Aortic valve regurgitation is  mild. No aortic stenosis is present.   6. Aortic dilatation noted. There is borderline dilatation of the  ascending aorta, measuring 38 mm.   7. The inferior vena cava is normal in size with greater than 50%  respiratory variability, suggesting right atrial pressure of 3 mmHg.   Conclusion(s)/Recommendation(s): Critical findings reported to Dr.  Zorita Pang and acknowledged  at 4:26 PM 08/06/20.      Risk Assessment/Calculations:   {Does this patient have ATRIAL FIBRILLATION?:[id number]}     ASSESSMENT:    No diagnosis found.   PLAN:  In order of problems listed above:  New LV Thrombus:- likely secondary to aneurysm & remodeling now on coumadin   Paroxsymal Atrial Fibrillation, Prior PE, Prior Stroke, DM, HTN, CAD as below, HFmrEF as below- CHADSVASC=8.- Rhythm control options: malaise, weakness,nausea and vomiting on amiodarone; will attempt rate control if possible.    Ischemic cardiomyopathy compensated. Can't add meds until GI problems resolve     CKD IIIa   Coronary Artery Disease;S/P CABG 06/2020-no angina   HLD on statin   Abdominal pain-ongoing GI symptoms with no clear diagnosis. Will refer to GI to follow closely.   History of PE post covid 11/2019       Shared Decision Making/Informed Consent   {Are you ordering a CV Procedure (e.g. stress test, cath, DCCV, TEE, etc)?   Press F2        :[id number]}    Medication Adjustments/Labs and Tests Ordered: Current medicines are reviewed at length with the patient today.  Concerns regarding medicines are outlined above.  Medication changes, Labs and Tests ordered today are listed in the Patient Instructions below. There are no Patient Instructions on file for this visit.   Signed, Ermalinda Barrios, PA-C  09/05/2020 2:52 PM    [facility] [place], [place], [place]   [address] Phone: [phone]; Fax: [phone]

## 2020-09-06 DIAGNOSIS — I5022 Chronic systolic (congestive) heart failure: Secondary | ICD-10-CM | POA: Diagnosis not present

## 2020-09-06 DIAGNOSIS — E1122 Type 2 diabetes mellitus with diabetic chronic kidney disease: Secondary | ICD-10-CM | POA: Diagnosis not present

## 2020-09-06 DIAGNOSIS — E1165 Type 2 diabetes mellitus with hyperglycemia: Secondary | ICD-10-CM | POA: Diagnosis not present

## 2020-09-06 DIAGNOSIS — N1831 Chronic kidney disease, stage 3a: Secondary | ICD-10-CM | POA: Diagnosis not present

## 2020-09-06 DIAGNOSIS — I513 Intracardiac thrombosis, not elsewhere classified: Secondary | ICD-10-CM | POA: Diagnosis not present

## 2020-09-06 DIAGNOSIS — I2510 Atherosclerotic heart disease of native coronary artery without angina pectoris: Secondary | ICD-10-CM | POA: Diagnosis not present

## 2020-09-06 DIAGNOSIS — I130 Hypertensive heart and chronic kidney disease with heart failure and stage 1 through stage 4 chronic kidney disease, or unspecified chronic kidney disease: Secondary | ICD-10-CM | POA: Diagnosis not present

## 2020-09-06 DIAGNOSIS — I252 Old myocardial infarction: Secondary | ICD-10-CM | POA: Diagnosis not present

## 2020-09-06 DIAGNOSIS — I255 Ischemic cardiomyopathy: Secondary | ICD-10-CM | POA: Diagnosis not present

## 2020-09-07 ENCOUNTER — Other Ambulatory Visit (HOSPITAL_COMMUNITY): Payer: Self-pay

## 2020-09-08 ENCOUNTER — Emergency Department (HOSPITAL_COMMUNITY): Payer: Medicare HMO

## 2020-09-08 ENCOUNTER — Inpatient Hospital Stay (HOSPITAL_COMMUNITY)
Admission: EM | Admit: 2020-09-08 | Discharge: 2020-09-11 | DRG: 092 | Disposition: A | Discharge status: Home Health | Payer: Medicare HMO | Attending: Internal Medicine | Admitting: Internal Medicine

## 2020-09-08 ENCOUNTER — Encounter (HOSPITAL_COMMUNITY): Payer: Self-pay | Admitting: Emergency Medicine

## 2020-09-08 ENCOUNTER — Other Ambulatory Visit: Payer: Self-pay

## 2020-09-08 DIAGNOSIS — F0150 Vascular dementia without behavioral disturbance: Secondary | ICD-10-CM | POA: Diagnosis present

## 2020-09-08 DIAGNOSIS — Z8616 Personal history of COVID-19: Secondary | ICD-10-CM

## 2020-09-08 DIAGNOSIS — K529 Noninfective gastroenteritis and colitis, unspecified: Secondary | ICD-10-CM | POA: Diagnosis present

## 2020-09-08 DIAGNOSIS — Z96653 Presence of artificial knee joint, bilateral: Secondary | ICD-10-CM | POA: Diagnosis present

## 2020-09-08 DIAGNOSIS — Z7982 Long term (current) use of aspirin: Secondary | ICD-10-CM

## 2020-09-08 DIAGNOSIS — I252 Old myocardial infarction: Secondary | ICD-10-CM

## 2020-09-08 DIAGNOSIS — G9341 Metabolic encephalopathy: Secondary | ICD-10-CM | POA: Diagnosis present

## 2020-09-08 DIAGNOSIS — G928 Other toxic encephalopathy: Secondary | ICD-10-CM | POA: Diagnosis present

## 2020-09-08 DIAGNOSIS — Z951 Presence of aortocoronary bypass graft: Secondary | ICD-10-CM

## 2020-09-08 DIAGNOSIS — Z794 Long term (current) use of insulin: Secondary | ICD-10-CM | POA: Diagnosis not present

## 2020-09-08 DIAGNOSIS — I6509 Occlusion and stenosis of unspecified vertebral artery: Secondary | ICD-10-CM | POA: Diagnosis not present

## 2020-09-08 DIAGNOSIS — Z86711 Personal history of pulmonary embolism: Secondary | ICD-10-CM

## 2020-09-08 DIAGNOSIS — E1122 Type 2 diabetes mellitus with diabetic chronic kidney disease: Secondary | ICD-10-CM | POA: Diagnosis present

## 2020-09-08 DIAGNOSIS — I639 Cerebral infarction, unspecified: Secondary | ICD-10-CM | POA: Diagnosis not present

## 2020-09-08 DIAGNOSIS — N1831 Chronic kidney disease, stage 3a: Secondary | ICD-10-CM | POA: Diagnosis present

## 2020-09-08 DIAGNOSIS — R4182 Altered mental status, unspecified: Secondary | ICD-10-CM | POA: Diagnosis present

## 2020-09-08 DIAGNOSIS — I129 Hypertensive chronic kidney disease with stage 1 through stage 4 chronic kidney disease, or unspecified chronic kidney disease: Secondary | ICD-10-CM | POA: Diagnosis present

## 2020-09-08 DIAGNOSIS — Z79899 Other long term (current) drug therapy: Secondary | ICD-10-CM

## 2020-09-08 DIAGNOSIS — F0151 Vascular dementia with behavioral disturbance: Secondary | ICD-10-CM

## 2020-09-08 DIAGNOSIS — I480 Paroxysmal atrial fibrillation: Secondary | ICD-10-CM | POA: Diagnosis present

## 2020-09-08 DIAGNOSIS — Z8673 Personal history of transient ischemic attack (TIA), and cerebral infarction without residual deficits: Secondary | ICD-10-CM | POA: Diagnosis not present

## 2020-09-08 DIAGNOSIS — R197 Diarrhea, unspecified: Secondary | ICD-10-CM | POA: Diagnosis not present

## 2020-09-08 DIAGNOSIS — I2510 Atherosclerotic heart disease of native coronary artery without angina pectoris: Secondary | ICD-10-CM | POA: Diagnosis present

## 2020-09-08 DIAGNOSIS — I25810 Atherosclerosis of coronary artery bypass graft(s) without angina pectoris: Secondary | ICD-10-CM | POA: Diagnosis not present

## 2020-09-08 DIAGNOSIS — N200 Calculus of kidney: Secondary | ICD-10-CM | POA: Diagnosis present

## 2020-09-08 DIAGNOSIS — Z7901 Long term (current) use of anticoagulants: Secondary | ICD-10-CM

## 2020-09-08 DIAGNOSIS — I429 Cardiomyopathy, unspecified: Secondary | ICD-10-CM | POA: Diagnosis present

## 2020-09-08 DIAGNOSIS — I63413 Cerebral infarction due to embolism of bilateral middle cerebral arteries: Secondary | ICD-10-CM | POA: Diagnosis not present

## 2020-09-08 DIAGNOSIS — E1165 Type 2 diabetes mellitus with hyperglycemia: Secondary | ICD-10-CM | POA: Diagnosis present

## 2020-09-08 DIAGNOSIS — G8929 Other chronic pain: Secondary | ICD-10-CM | POA: Diagnosis present

## 2020-09-08 DIAGNOSIS — I513 Intracardiac thrombosis, not elsewhere classified: Secondary | ICD-10-CM | POA: Diagnosis present

## 2020-09-08 DIAGNOSIS — E785 Hyperlipidemia, unspecified: Secondary | ICD-10-CM | POA: Diagnosis present

## 2020-09-08 DIAGNOSIS — D6489 Other specified anemias: Secondary | ICD-10-CM | POA: Diagnosis present

## 2020-09-08 DIAGNOSIS — R9431 Abnormal electrocardiogram [ECG] [EKG]: Secondary | ICD-10-CM | POA: Diagnosis not present

## 2020-09-08 DIAGNOSIS — K5730 Diverticulosis of large intestine without perforation or abscess without bleeding: Secondary | ICD-10-CM | POA: Diagnosis not present

## 2020-09-08 DIAGNOSIS — Z85828 Personal history of other malignant neoplasm of skin: Secondary | ICD-10-CM

## 2020-09-08 DIAGNOSIS — T380X5A Adverse effect of glucocorticoids and synthetic analogues, initial encounter: Secondary | ICD-10-CM | POA: Diagnosis present

## 2020-09-08 DIAGNOSIS — I69398 Other sequelae of cerebral infarction: Secondary | ICD-10-CM

## 2020-09-08 DIAGNOSIS — I6389 Other cerebral infarction: Secondary | ICD-10-CM | POA: Diagnosis not present

## 2020-09-08 DIAGNOSIS — N281 Cyst of kidney, acquired: Secondary | ICD-10-CM | POA: Diagnosis not present

## 2020-09-08 DIAGNOSIS — I255 Ischemic cardiomyopathy: Secondary | ICD-10-CM | POA: Diagnosis not present

## 2020-09-08 DIAGNOSIS — K8020 Calculus of gallbladder without cholecystitis without obstruction: Secondary | ICD-10-CM | POA: Diagnosis present

## 2020-09-08 DIAGNOSIS — N1832 Chronic kidney disease, stage 3b: Secondary | ICD-10-CM | POA: Diagnosis not present

## 2020-09-08 DIAGNOSIS — F01518 Vascular dementia, unspecified severity, with other behavioral disturbance: Secondary | ICD-10-CM

## 2020-09-08 LAB — CBC
HCT: 27.9 % — ABNORMAL LOW (ref 39.0–52.0)
Hemoglobin: 8.5 g/dL — ABNORMAL LOW (ref 13.0–17.0)
MCH: 27.2 pg (ref 26.0–34.0)
MCHC: 30.5 g/dL (ref 30.0–36.0)
MCV: 89.4 fL (ref 80.0–100.0)
Platelets: 783 10*3/uL — ABNORMAL HIGH (ref 150–400)
RBC: 3.12 MIL/uL — ABNORMAL LOW (ref 4.22–5.81)
RDW: 17.5 % — ABNORMAL HIGH (ref 11.5–15.5)
WBC: 10.7 10*3/uL — ABNORMAL HIGH (ref 4.0–10.5)
nRBC: 0 % (ref 0.0–0.2)

## 2020-09-08 LAB — COMPREHENSIVE METABOLIC PANEL
ALT: 19 U/L (ref 0–44)
AST: 44 U/L — ABNORMAL HIGH (ref 15–41)
Albumin: 1.9 g/dL — ABNORMAL LOW (ref 3.5–5.0)
Alkaline Phosphatase: 52 U/L (ref 38–126)
Anion gap: 10 (ref 5–15)
BUN: 21 mg/dL (ref 8–23)
CO2: 21 mmol/L — ABNORMAL LOW (ref 22–32)
Calcium: 8.1 mg/dL — ABNORMAL LOW (ref 8.9–10.3)
Chloride: 104 mmol/L (ref 98–111)
Creatinine, Ser: 1.19 mg/dL (ref 0.61–1.24)
GFR, Estimated: >60 mL/min (ref >60)
Glucose, Bld: 257 mg/dL — ABNORMAL HIGH (ref 70–99)
Potassium: 4.5 mmol/L (ref 3.5–5.1)
Sodium: 135 mmol/L (ref 135–145)
Total Bilirubin: 0.4 mg/dL (ref 0.3–1.2)
Total Protein: 4.7 g/dL — ABNORMAL LOW (ref 6.5–8.1)

## 2020-09-08 LAB — CBG MONITORING, ED: Glucose-Capillary: 225 mg/dL — ABNORMAL HIGH (ref 70–99)

## 2020-09-08 LAB — LIPASE, BLOOD: Lipase: 59 U/L — ABNORMAL HIGH (ref 11–51)

## 2020-09-08 NOTE — ED Notes (Signed)
Dr. Alvino Chapel notified of pt and CT head ordered.

## 2020-09-08 NOTE — ED Triage Notes (Signed)
C/o 4-5 loose stools per day for the past few weeks.  Generalized abd tenderness.  Pt has been taking Prednisone for 2 months and sugars have been elevated.  Family reports confusion x 4 days.  Saying and doing things that didn't make sense.  Such as saying he was cleaning the fridge to make room for chili and didn't have chili and putting his wife's clothes on backwards.  No arm drift.  Speech clear.

## 2020-09-09 ENCOUNTER — Inpatient Hospital Stay (HOSPITAL_COMMUNITY): Payer: Medicare HMO

## 2020-09-09 ENCOUNTER — Encounter (HOSPITAL_COMMUNITY): Payer: Self-pay | Admitting: Emergency Medicine

## 2020-09-09 ENCOUNTER — Other Ambulatory Visit (HOSPITAL_COMMUNITY): Payer: Medicare HMO

## 2020-09-09 ENCOUNTER — Emergency Department (HOSPITAL_COMMUNITY): Payer: Medicare HMO

## 2020-09-09 DIAGNOSIS — Z7901 Long term (current) use of anticoagulants: Secondary | ICD-10-CM | POA: Diagnosis not present

## 2020-09-09 DIAGNOSIS — K8020 Calculus of gallbladder without cholecystitis without obstruction: Secondary | ICD-10-CM | POA: Diagnosis not present

## 2020-09-09 DIAGNOSIS — F0150 Vascular dementia without behavioral disturbance: Secondary | ICD-10-CM | POA: Diagnosis present

## 2020-09-09 DIAGNOSIS — I63413 Cerebral infarction due to embolism of bilateral middle cerebral arteries: Secondary | ICD-10-CM | POA: Diagnosis not present

## 2020-09-09 DIAGNOSIS — Z794 Long term (current) use of insulin: Secondary | ICD-10-CM

## 2020-09-09 DIAGNOSIS — Z79899 Other long term (current) drug therapy: Secondary | ICD-10-CM | POA: Diagnosis not present

## 2020-09-09 DIAGNOSIS — G9341 Metabolic encephalopathy: Secondary | ICD-10-CM | POA: Diagnosis not present

## 2020-09-09 DIAGNOSIS — N1831 Chronic kidney disease, stage 3a: Secondary | ICD-10-CM

## 2020-09-09 DIAGNOSIS — I129 Hypertensive chronic kidney disease with stage 1 through stage 4 chronic kidney disease, or unspecified chronic kidney disease: Secondary | ICD-10-CM | POA: Diagnosis present

## 2020-09-09 DIAGNOSIS — I6509 Occlusion and stenosis of unspecified vertebral artery: Secondary | ICD-10-CM | POA: Diagnosis not present

## 2020-09-09 DIAGNOSIS — Z86711 Personal history of pulmonary embolism: Secondary | ICD-10-CM | POA: Diagnosis not present

## 2020-09-09 DIAGNOSIS — Z8673 Personal history of transient ischemic attack (TIA), and cerebral infarction without residual deficits: Secondary | ICD-10-CM

## 2020-09-09 DIAGNOSIS — Z85828 Personal history of other malignant neoplasm of skin: Secondary | ICD-10-CM | POA: Diagnosis not present

## 2020-09-09 DIAGNOSIS — Z7982 Long term (current) use of aspirin: Secondary | ICD-10-CM | POA: Diagnosis not present

## 2020-09-09 DIAGNOSIS — I513 Intracardiac thrombosis, not elsewhere classified: Secondary | ICD-10-CM | POA: Diagnosis present

## 2020-09-09 DIAGNOSIS — I2510 Atherosclerotic heart disease of native coronary artery without angina pectoris: Secondary | ICD-10-CM | POA: Diagnosis present

## 2020-09-09 DIAGNOSIS — D6489 Other specified anemias: Secondary | ICD-10-CM | POA: Diagnosis present

## 2020-09-09 DIAGNOSIS — R4182 Altered mental status, unspecified: Secondary | ICD-10-CM | POA: Diagnosis not present

## 2020-09-09 DIAGNOSIS — E785 Hyperlipidemia, unspecified: Secondary | ICD-10-CM | POA: Diagnosis present

## 2020-09-09 DIAGNOSIS — Z96653 Presence of artificial knee joint, bilateral: Secondary | ICD-10-CM | POA: Diagnosis present

## 2020-09-09 DIAGNOSIS — I639 Cerebral infarction, unspecified: Secondary | ICD-10-CM | POA: Diagnosis not present

## 2020-09-09 DIAGNOSIS — I429 Cardiomyopathy, unspecified: Secondary | ICD-10-CM | POA: Diagnosis present

## 2020-09-09 DIAGNOSIS — T380X5A Adverse effect of glucocorticoids and synthetic analogues, initial encounter: Secondary | ICD-10-CM | POA: Diagnosis present

## 2020-09-09 DIAGNOSIS — N200 Calculus of kidney: Secondary | ICD-10-CM | POA: Diagnosis not present

## 2020-09-09 DIAGNOSIS — E1122 Type 2 diabetes mellitus with diabetic chronic kidney disease: Secondary | ICD-10-CM

## 2020-09-09 DIAGNOSIS — K5730 Diverticulosis of large intestine without perforation or abscess without bleeding: Secondary | ICD-10-CM | POA: Diagnosis not present

## 2020-09-09 DIAGNOSIS — I6389 Other cerebral infarction: Secondary | ICD-10-CM | POA: Diagnosis not present

## 2020-09-09 DIAGNOSIS — R197 Diarrhea, unspecified: Secondary | ICD-10-CM

## 2020-09-09 DIAGNOSIS — N1832 Chronic kidney disease, stage 3b: Secondary | ICD-10-CM

## 2020-09-09 DIAGNOSIS — Z8616 Personal history of COVID-19: Secondary | ICD-10-CM | POA: Diagnosis not present

## 2020-09-09 DIAGNOSIS — Z951 Presence of aortocoronary bypass graft: Secondary | ICD-10-CM | POA: Diagnosis not present

## 2020-09-09 DIAGNOSIS — N281 Cyst of kidney, acquired: Secondary | ICD-10-CM | POA: Diagnosis not present

## 2020-09-09 DIAGNOSIS — I255 Ischemic cardiomyopathy: Secondary | ICD-10-CM | POA: Diagnosis not present

## 2020-09-09 DIAGNOSIS — E1165 Type 2 diabetes mellitus with hyperglycemia: Secondary | ICD-10-CM | POA: Diagnosis present

## 2020-09-09 DIAGNOSIS — I25810 Atherosclerosis of coronary artery bypass graft(s) without angina pectoris: Secondary | ICD-10-CM | POA: Diagnosis not present

## 2020-09-09 DIAGNOSIS — I480 Paroxysmal atrial fibrillation: Secondary | ICD-10-CM | POA: Diagnosis present

## 2020-09-09 DIAGNOSIS — G928 Other toxic encephalopathy: Secondary | ICD-10-CM | POA: Diagnosis present

## 2020-09-09 DIAGNOSIS — I252 Old myocardial infarction: Secondary | ICD-10-CM | POA: Diagnosis not present

## 2020-09-09 LAB — URINALYSIS, ROUTINE W REFLEX MICROSCOPIC
Bacteria, UA: NONE SEEN
Bilirubin Urine: NEGATIVE
Glucose, UA: >=500 mg/dL — AB
Hgb urine dipstick: NEGATIVE
Ketones, ur: NEGATIVE mg/dL
Leukocytes,Ua: NEGATIVE
Nitrite: NEGATIVE
Protein, ur: NEGATIVE mg/dL
Specific Gravity, Urine: 1.027 (ref 1.005–1.030)
pH: 5 (ref 5.0–8.0)

## 2020-09-09 LAB — LIPID PANEL
Cholesterol: 65 mg/dL (ref 0–200)
HDL: 21 mg/dL — ABNORMAL LOW (ref >40)
LDL Cholesterol: 25 mg/dL (ref 0–99)
Total CHOL/HDL Ratio: 3.1 RATIO
Triglycerides: 94 mg/dL (ref <150)
VLDL: 19 mg/dL (ref 0–40)

## 2020-09-09 LAB — CBG MONITORING, ED
Glucose-Capillary: 83 mg/dL (ref 70–99)
Glucose-Capillary: 120 mg/dL — ABNORMAL HIGH (ref 70–99)
Glucose-Capillary: 162 mg/dL — ABNORMAL HIGH (ref 70–99)
Glucose-Capillary: 174 mg/dL — ABNORMAL HIGH (ref 70–99)

## 2020-09-09 LAB — RESP PANEL BY RT-PCR (FLU A&B, COVID) ARPGX2
Influenza A by PCR: NEGATIVE
Influenza B by PCR: NEGATIVE
SARS Coronavirus 2 by RT PCR: NEGATIVE

## 2020-09-09 LAB — PROTIME-INR
INR: 2.5 — ABNORMAL HIGH (ref 0.8–1.2)
Prothrombin Time: 26.3 seconds — ABNORMAL HIGH (ref 11.4–15.2)

## 2020-09-09 LAB — AMMONIA: Ammonia: 22 umol/L (ref 9–35)

## 2020-09-09 LAB — HEMOGLOBIN A1C
Hgb A1c MFr Bld: 6.8 % — ABNORMAL HIGH (ref 4.8–5.6)
Mean Plasma Glucose: 148.46 mg/dL

## 2020-09-09 MED ORDER — HYOSCYAMINE SULFATE ER 0.375 MG PO TB12
0.3750 mg | ORAL_TABLET | Freq: Every day | ORAL | Status: DC
  Administered 2020-09-09 – 2020-09-10 (×2): 0.375 mg via ORAL
  Filled 2020-09-09 (×3): qty 1

## 2020-09-09 MED ORDER — ROSUVASTATIN CALCIUM 20 MG PO TABS
20.0000 mg | ORAL_TABLET | Freq: Every day | ORAL | Status: DC
  Administered 2020-09-09 – 2020-09-11 (×3): 20 mg via ORAL
  Filled 2020-09-09 (×3): qty 1

## 2020-09-09 MED ORDER — INSULIN ASPART 100 UNIT/ML ~~LOC~~ SOLN
0.0000 [IU] | Freq: Three times a day (TID) | SUBCUTANEOUS | Status: DC
  Administered 2020-09-09: 3 [IU] via SUBCUTANEOUS
  Administered 2020-09-10 (×2): 5 [IU] via SUBCUTANEOUS
  Administered 2020-09-11: 2 [IU] via SUBCUTANEOUS

## 2020-09-09 MED ORDER — METOPROLOL TARTRATE 25 MG PO TABS
25.0000 mg | ORAL_TABLET | Freq: Two times a day (BID) | ORAL | Status: DC
  Administered 2020-09-09 – 2020-09-11 (×5): 25 mg via ORAL
  Filled 2020-09-09 (×5): qty 1

## 2020-09-09 MED ORDER — INSULIN ASPART 100 UNIT/ML ~~LOC~~ SOLN: 0.0000 [IU] | Freq: Every day | SUBCUTANEOUS | Status: DC

## 2020-09-09 MED ORDER — FENOFIBRATE 160 MG PO TABS
160.0000 mg | ORAL_TABLET | Freq: Every day | ORAL | Status: DC
  Administered 2020-09-10 – 2020-09-11 (×2): 160 mg via ORAL
  Filled 2020-09-09 (×3): qty 1

## 2020-09-09 MED ORDER — INSULIN GLARGINE 100 UNIT/ML ~~LOC~~ SOLN
20.0000 [IU] | Freq: Every day | SUBCUTANEOUS | Status: DC
  Administered 2020-09-09 – 2020-09-11 (×3): 20 [IU] via SUBCUTANEOUS
  Filled 2020-09-09 (×3): qty 0.2

## 2020-09-09 MED ORDER — TAMSULOSIN HCL 0.4 MG PO CAPS
0.4000 mg | ORAL_CAPSULE | Freq: Every day | ORAL | Status: DC
  Administered 2020-09-09 – 2020-09-10 (×2): 0.4 mg via ORAL
  Filled 2020-09-09 (×2): qty 1

## 2020-09-09 MED ORDER — WARFARIN SODIUM 2 MG PO TABS
2.0000 mg | ORAL_TABLET | Freq: Every day | ORAL | Status: DC
  Administered 2020-09-09: 2 mg via ORAL
  Filled 2020-09-09: qty 1

## 2020-09-09 MED ORDER — THIAMINE HCL 100 MG/ML IJ SOLN
100.0000 mg | Freq: Every day | INTRAMUSCULAR | Status: DC
  Administered 2020-09-09: 100 mg via INTRAVENOUS
  Filled 2020-09-09: qty 2

## 2020-09-09 MED ORDER — ACETAMINOPHEN 650 MG RE SUPP: 650.0000 mg | RECTAL | Status: DC | PRN

## 2020-09-09 MED ORDER — WARFARIN - PHYSICIAN DOSING INPATIENT: Freq: Every day | Status: DC

## 2020-09-09 MED ORDER — ASPIRIN EC 325 MG PO TBEC
325.0000 mg | DELAYED_RELEASE_TABLET | Freq: Every day | ORAL | Status: DC
  Administered 2020-09-09 – 2020-09-11 (×3): 325 mg via ORAL
  Filled 2020-09-09 (×3): qty 1

## 2020-09-09 MED ORDER — INSULIN GLARGINE 100 UNIT/ML ~~LOC~~ SOLN: 20.0000 [IU] | Freq: Every day | SUBCUTANEOUS | Status: DC

## 2020-09-09 MED ORDER — FINASTERIDE 5 MG PO TABS
5.0000 mg | ORAL_TABLET | Freq: Every day | ORAL | Status: DC
  Administered 2020-09-09 – 2020-09-11 (×3): 5 mg via ORAL
  Filled 2020-09-09 (×3): qty 1

## 2020-09-09 MED ORDER — LOPERAMIDE HCL 2 MG PO CAPS: 2.0000 mg | ORAL_CAPSULE | Freq: Four times a day (QID) | ORAL | Status: DC | PRN

## 2020-09-09 MED ORDER — ACETAMINOPHEN 325 MG PO TABS: 650.0000 mg | ORAL_TABLET | ORAL | Status: DC | PRN

## 2020-09-09 MED ORDER — ACETAMINOPHEN 160 MG/5ML PO SOLN: 650.0000 mg | ORAL | Status: DC | PRN

## 2020-09-09 MED ORDER — WARFARIN - PHARMACIST DOSING INPATIENT: Freq: Every day | Status: DC

## 2020-09-09 MED ORDER — STROKE: EARLY STAGES OF RECOVERY BOOK
Freq: Once | Status: AC
  Filled 2020-09-09: qty 1

## 2020-09-09 NOTE — ED Provider Notes (Signed)
[facility] DEPARTMENT Provider Note   CSN: [id number] Arrival date & time: 09/08/20  1733     History Chief Complaint  Patient presents with  . Abdominal Pain  . Altered Mental Status    [place] is a 75 y.o. male.  The history is provided by the spouse. The history is limited by the condition of the patient.  Altered Mental Status Presenting symptoms: confusion   Severity:  Severe Most recent episode:  More than 2 days ago Episode history:  Single Duration:  5 days Timing:  Constant Progression:  Unchanged Chronicity:  New Context: not alcohol use   Associated symptoms: abdominal pain   Associated symptoms: no fever, no rash and no vomiting   Associated symptoms comment:  Chronic abdominal pain with diarrhea for 4 days.  Seen by GI.        Past Medical History:  Diagnosis Date  . Arthritis   . 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 Cancer (HCC)    Skin  . Cerebral artery disease   . Coronary artery disease   . COVID-07 Dec 2019  . COVID-19 virus infection 12/02/2019  . Depression   . Essential hypertension   . Hernia, umbilical   . HOH (hard of hearing)   . Hyperlipidemia   . NSTEMI (non-ST elevated myocardial infarction) ([place])   . OSA on CPAP   . Pulmonary emboli [facility])    May 2021  . Type 2 diabetes mellitus [facility])     Patient Active Problem List   Diagnosis Date Noted  . Thrombus in heart chamber 08/13/2020  . Abdominal pain 08/03/2020  . Chronic kidney disease, stage 3a ([place]) 08/03/2020  . Mixed diabetic hyperlipidemia associated with type 2 diabetes mellitus ([place]) 08/03/2020  . Uncontrolled type 2 diabetes mellitus with hyperglycemia, with long-term current use of insulin ([place]) 08/03/2020  . Foodborne gastroenteritis 08/03/2020  . SIRS (systemic inflammatory response syndrome) ([place]) 08/03/2020  . Postop check 07/25/2020  . Hx of CABG 07/09/2020  . Coronary artery disease involving native heart 07/04/2020   . Abnormal nuclear stress test   . Multiple subsegmental pulmonary emboli without acute cor pulmonale (HCC)   . AF (paroxysmal atrial fibrillation) ([place])   . Hypersomnia 08/30/2019  . History of adenomatous polyp of colon 05/06/2018  . Vasomotor rhinitis 07/18/2017  . Arthritis of knee, right 01/06/2014  . Brainstem stroke ([place]) 09/07/2011    Class: Acute  . Dysarthria 09/05/2011    Class: Acute  . Essential hypertension 09/05/2011    Class: Chronic  . OSA on CPAP 09/05/2011    Class: Chronic  . Anemia 09/05/2011    Class: Chronic    Past Surgical History:  Procedure Laterality Date  . COLONOSCOPY W/ POLYPECTOMY    . CORONARY ARTERY BYPASS GRAFT N/A 07/09/2020   Procedure: CORONARY ARTERY BYPASS GRAFTING (CABG) TIMES THREE , USING LEFT INTERNAL MAMMARY ARTERY AND RIGHT LEG GREATER SAPHENOUS VEIN HARVESTED ENDOSCOPICALLY;  Surgeon: Ivin Poot, MD;  Location: [place];  Service: Open Heart Surgery;  Laterality: N/A;  . LEFT HEART CATH AND CORONARY ANGIOGRAPHY N/A 07/04/2020   Procedure: LEFT HEART CATH AND CORONARY ANGIOGRAPHY;  Surgeon: Troy Sine, MD;  Location: [place] CV LAB;  Service: Cardiovascular;  Laterality: N/A;  . TEE WITHOUT CARDIOVERSION N/A 07/09/2020   Procedure: TRANSESOPHAGEAL ECHOCARDIOGRAM (TEE);  Surgeon: Prescott Gum, Collier Salina, MD;  Location: [place];  Service: Open Heart Surgery;  Laterality: N/A;  . TOTAL KNEE  ARTHROPLASTY Left 04/22/2013   Procedure: TOTAL KNEE ARTHROPLASTY- left;  Surgeon: Augustin Schooling, MD;  Location: [place];  Service: Orthopedics;  Laterality: Left;  with femoral block  . TOTAL KNEE ARTHROPLASTY Right 01/06/2014   Procedure: RIGHT TOTAL KNEE ARTHROPLASTY;  Surgeon: Augustin Schooling, MD;  Location: [place];  Service: Orthopedics;  Laterality: Right;       Family History  Problem Relation Age of Onset  . CAD Brother        Premature    Social History   Tobacco Use  . Smoking status: Never Smoker  . Smokeless tobacco: Never Used   Vaping Use  . Vaping Use: Never used  Substance Use Topics  . Alcohol use: No  . Drug use: No    Home Medications Prior to Admission medications   Medication Sig Start Date End Date Taking? Authorizing Provider  acetaminophen (TYLENOL) 325 MG tablet Take 2 tablets (650 mg total) by mouth every 6 (six) hours as needed for mild pain or headache (or Fever >/= 101). 08/10/20   Domenic Polite, MD  aspirin EC 325 MG EC tablet Take 1 tablet (325 mg total) by mouth daily. 07/16/20   Gold, Wilder Glade, PA-C  BD PEN NEEDLE NANO U/F 32G X 4 MM MISC 1 each by Other route daily.  03/19/18   [provider]  Empagliflozin-metFORMIN HCl 11-998 MG TABS Take 1 tablet by mouth 2 (two) times daily.    [provider]  fenofibrate 160 MG tablet Take 160 mg by mouth daily.    [provider]  finasteride (PROSCAR) 5 MG tablet Take 5 mg by mouth daily. Patient not taking: Reported on 08/24/2020 08/17/20   [provider]  furosemide (LASIX) 40 MG tablet TAKE 1 TABLET DAILY AS NEEDED FOR WEIGHT GAIN OF 2-3LBS IN 24 HOURS 09/03/20   Satira Sark, MD  HUMALOG KWIKPEN 100 UNIT/ML KwikPen Inject 8 Units into the skin with breakfast, with lunch, and with evening meal. 04/27/18   [provider]  hyoscyamine (ANASPAZ) 0.125 MG TBDP disintergrating tablet Place 0.125 mg under the tongue every 6 (six) hours as needed for cramping.    [provider]  hyoscyamine (LEVBID) 0.375 MG 12 hr tablet Take 0.375 mg by mouth at bedtime. 08/16/20 09/15/20  [provider]  loperamide (IMODIUM) 2 MG capsule Take 1 capsule (2 mg total) by mouth 4 (four) times daily as needed for up to 5 doses for diarrhea or loose stools. 08/24/20   Luna Fuse, MD  metoprolol tartrate (LOPRESSOR) 25 MG tablet Take 1 tablet (25 mg total) by mouth 2 (two) times daily. 07/16/20   Gold, Wayne E, PA-C  metroNIDAZOLE (FLAGYL) 500 MG tablet Take 500 mg by mouth 3 (three) times daily. Patient not  taking: Reported on 08/24/2020 08/20/20   [provider]  [facility] VERIO test strip 1 each by Other route in the morning, at noon, and at bedtime.  03/20/18   [provider]  rosuvastatin (CRESTOR) 20 MG tablet Take 1 tablet (20 mg total) by mouth daily. 07/16/20   John Giovanni, PA-C  tamsulosin (FLOMAX) 0.4 MG CAPS capsule Take 1 capsule (0.4 mg total) by mouth daily. 08/11/20   Domenic Polite, MD  TOUJEO SOLOSTAR 300 UNIT/ML Solostar Pen Inject 20 Units into the skin daily. 08/10/20   Domenic Polite, MD  traMADol (ULTRAM) 50 MG tablet Take 1 tablet (50 mg total) by mouth every 6 (six) hours as needed for moderate pain.  Patient not taking: Reported on 08/24/2020 07/16/20   Jadene Pierini E, PA-C  warfarin (COUMADIN) 2 MG tablet Take 1 tablet (2 mg total) by mouth daily. 09/03/20   Arnoldo Lenis, MD    Allergies    Lipitor [atorvastatin]  Review of Systems   Review of Systems  Unable to perform ROS: Mental status change  Constitutional: Negative for fever.  Cardiovascular: Negative for leg swelling.  Gastrointestinal: Positive for abdominal pain. Negative for vomiting.  Skin: Negative for rash.  Neurological: Negative for facial asymmetry.  Psychiatric/Behavioral: Positive for confusion.    Physical Exam Updated Vital Signs BP 105/67 (BP Location: Left Arm)   Pulse 85   Temp 98.5 F (36.9 C)   Resp 14   SpO2 98%   Physical Exam Vitals and nursing note reviewed.  Constitutional:      General: He is not in acute distress.    Appearance: Normal appearance.  HENT:     Head: Normocephalic and atraumatic.     Nose: Nose normal.  Eyes:     Conjunctiva/sclera: Conjunctivae normal.     Pupils: Pupils are equal, round, and reactive to light.  Cardiovascular:     Rate and Rhythm: Normal rate and regular rhythm.     Pulses: Normal pulses.     Heart sounds: Normal heart sounds.  Pulmonary:     Effort: Pulmonary effort is normal.     Breath sounds: Normal breath  sounds.  Abdominal:     General: Abdomen is flat. Bowel sounds are normal.     Palpations: Abdomen is soft.     Tenderness: There is no abdominal tenderness. There is no guarding or rebound.  Musculoskeletal:        General: Normal range of motion.     Cervical back: Normal range of motion and neck supple.  Skin:    General: Skin is warm and dry.     Capillary Refill: Capillary refill takes less than 2 seconds.  Neurological:     General: No focal deficit present.     Mental Status: He is alert and oriented to person, place, and time.  Psychiatric:        Mood and Affect: Mood normal.        Behavior: Behavior normal.     ED Results / Procedures / Treatments   Labs (all labs ordered are listed, but only abnormal results are displayed) Results for orders placed or performed during the hospital encounter of 09/08/20  Lipase, blood  Result Value Ref Range   Lipase 59 (H) 11 - 51 U/L  Comprehensive metabolic panel  Result Value Ref Range   Sodium 135 135 - 145 mmol/L   Potassium 4.5 3.5 - 5.1 mmol/L   Chloride 104 98 - 111 mmol/L   CO2 21 (L) 22 - 32 mmol/L   Glucose, Bld 257 (H) 70 - 99 mg/dL   BUN 21 8 - 23 mg/dL   Creatinine, Ser 1.19 0.61 - 1.24 mg/dL   Calcium 8.1 (L) 8.9 - 10.3 mg/dL   Total Protein 4.7 (L) 6.5 - 8.1 g/dL   Albumin 1.9 (L) 3.5 - 5.0 g/dL   AST 44 (H) 15 - 41 U/L   ALT 19 0 - 44 U/L   Alkaline Phosphatase 52 38 - 126 U/L   Total Bilirubin 0.4 0.3 - 1.2 mg/dL   GFR, Estimated >60 >60 mL/min   Anion gap 10 5 - 15  CBC  Result Value Ref Range   WBC 10.7 (  H) 4.0 - 10.5 K/uL   RBC 3.12 (L) 4.22 - 5.81 MIL/uL   Hemoglobin 8.5 (L) 13.0 - 17.0 g/dL   HCT 27.9 (L) 39.0 - 52.0 %   MCV 89.4 80.0 - 100.0 fL   MCH 27.2 26.0 - 34.0 pg   MCHC 30.5 30.0 - 36.0 g/dL   RDW 17.5 (H) 11.5 - 15.5 %   Platelets 783 (H) 150 - 400 K/uL   nRBC 0.0 0.0 - 0.2 %  CBG monitoring, ED  Result Value Ref Range   Glucose-Capillary 225 (H) 70 - 99 mg/dL   Comment 1 Notify  RN    CT Head Wo Contrast  Result Date: 09/08/2020 CLINICAL DATA:  Mental status changes of unknown cause, confusion. History coronary artery disease post MI, hypertension, type II diabetes mellitus, history COVID-19 in May 2021 EXAM: CT HEAD WITHOUT CONTRAST TECHNIQUE: Contiguous axial images were obtained from the base of the skull through the vertex without intravenous contrast. Sagittal and coronal MPR images reconstructed from axial data set. COMPARISON:  08/24/2020 FINDINGS: Brain: Generalized atrophy. Normal ventricular morphology. No midline shift or mass effect. Evolving subacute infarct LEFT frontal lobe, better defined than on previous exam. Subacute to old posterior RIGHT parietal infarct little changed. Small vessel chronic ischemic changes of deep cerebral white matter. No intracranial hemorrhage, mass lesion, or additional infarct identified. No extra-axial fluid collections. Vascular: No hyperdense vessels. Atherosclerotic calcifications of internal carotid arteries bilaterally at skull base Skull: Intact Sinuses/Orbits: Clear Other: N/A IMPRESSION: Evolving subacute infarct LEFT frontal lobe since prior study. Subacute to old posterior RIGHT parietal infarct little changed. Atrophy with small vessel chronic ischemic changes of deep cerebral white matter. No new intracranial abnormalities. Electronically Signed   By: Lavonia Dana M.D.   On: 09/08/2020 19:19   CT Head Wo Contrast  Result Date: 08/24/2020 CLINICAL DATA:  Dysuria, diarrhea, right inguinal pain, recent fall out of bed EXAM: CT HEAD WITHOUT CONTRAST TECHNIQUE: Contiguous axial images were obtained from the base of the skull through the vertex without intravenous contrast. COMPARISON:  09/05/2011 FINDINGS: Brain: There are confluent hypodensities throughout the periventricular white matter, most compatible with chronic small vessel ischemic change. Focal encephalomalacia within the right parietal cortex consistent with prior infarct.  There is hypodensity within the left frontal cortex, compatible with age indeterminate infarct, favor subacute to chronic. No evidence of acute hemorrhage. The lateral ventricles and remaining midline structures are unremarkable. No acute extra-axial fluid collections. No mass effect. Vascular: Extensive atherosclerosis.  No hyperdense vessel. Skull: Normal. Negative for fracture or focal lesion. Sinuses/Orbits: No acute finding. Other: None. IMPRESSION: 1. Age-indeterminate left frontal cortical infarct, favor subacute to chronic. 2. Chronic ischemic changes within the right parietal cortex and throughout the periventricular white matter. 3. No acute intracranial hemorrhage. Electronically Signed   By: Randa Ngo M.D.   On: 08/24/2020 23:07   CT Abdomen Pelvis W Contrast  Result Date: 08/24/2020 CLINICAL DATA:  Right side abdominal pain EXAM: CT ABDOMEN AND PELVIS WITH CONTRAST TECHNIQUE: Multidetector CT imaging of the abdomen and pelvis was performed using the standard protocol following bolus administration of intravenous contrast. CONTRAST:  192mL OMNIPAQUE IOHEXOL 300 MG/ML  SOLN COMPARISON:  08/06/2020 FINDINGS: Lower chest: No acute abnormality. Hepatobiliary: Small layering gallstones within the gallbladder. No focal hepatic abnormality. Pancreas: No focal abnormality or ductal dilatation. Spleen: No focal abnormality.  Normal size. Adrenals/Urinary Tract: Bilateral renal parapelvic cysts. No hydronephrosis. Punctate nonobstructing stone in the midpole of the left kidney. Adrenal glands  and bladder unremarkable. Stomach/Bowel: There are small bowel loops which are mildly thick walled in the left pelvis and mid pelvis which could reflect enteritis. Stomach and large bowel grossly unremarkable. Vascular/Lymphatic: Aortic atherosclerosis. No evidence of aneurysm or adenopathy. Reproductive: Mildly enlarged prostate. Other: No free fluid or free air. Umbilical hernia containing fat, unchanged.  Musculoskeletal: No acute bony abnormality. IMPRESSION: Mild wall thickening within small bowel loops in the left and mid pelvis could reflect enteritis. Cholelithiasis.  No CT evidence for acute cholecystitis. Aortic atherosclerosis. Stable small umbilical hernia containing fat. Electronically Signed   By: Rolm Baptise M.D.   On: 08/24/2020 23:14    Radiology CT Head Wo Contrast  Result Date: 09/08/2020 CLINICAL DATA:  Mental status changes of unknown cause, confusion. History coronary artery disease post MI, hypertension, type II diabetes mellitus, history COVID-19 in May 2021 EXAM: CT HEAD WITHOUT CONTRAST TECHNIQUE: Contiguous axial images were obtained from the base of the skull through the vertex without intravenous contrast. Sagittal and coronal MPR images reconstructed from axial data set. COMPARISON:  08/24/2020 FINDINGS: Brain: Generalized atrophy. Normal ventricular morphology. No midline shift or mass effect. Evolving subacute infarct LEFT frontal lobe, better defined than on previous exam. Subacute to old posterior RIGHT parietal infarct little changed. Small vessel chronic ischemic changes of deep cerebral white matter. No intracranial hemorrhage, mass lesion, or additional infarct identified. No extra-axial fluid collections. Vascular: No hyperdense vessels. Atherosclerotic calcifications of internal carotid arteries bilaterally at skull base Skull: Intact Sinuses/Orbits: Clear Other: N/A IMPRESSION: Evolving subacute infarct LEFT frontal lobe since prior study. Subacute to old posterior RIGHT parietal infarct little changed. Atrophy with small vessel chronic ischemic changes of deep cerebral white matter. No new intracranial abnormalities. Electronically Signed   By: Lavonia Dana M.D.   On: 09/08/2020 19:19    Procedures Procedures   Medications Ordered in ED Medications - No data to display  ED Course  I have reviewed the triage vital signs and the nursing notes.  Pertinent labs &  imaging results that were available during my care of the patient were reviewed by me and considered in my medical decision making (see chart for details).   Case d/w Dr. Leonel Ramsay who will consult  [place] was evaluated in Emergency Department on 09/09/2020 for the symptoms described in the history of present illness. He was evaluated in the context of the global COVID-19 pandemic, which necessitated consideration that the patient might be at risk for infection with the SARS-CoV-2 virus that causes COVID-19. Institutional protocols and algorithms that pertain to the evaluation of patients at risk for COVID-19 are in a state of rapid change based on information released by regulatory bodies including the CDC and federal and state organizations. These policies and algorithms were followed during the patient's care in the ED.  Final Clinical Impression(s) / ED Diagnoses Final diagnoses:  Cerebrovascular accident (CVA), unspecified mechanism ([place])    Admit to medicine   Vasilia Dise, MD 09/09/20 [id number]

## 2020-09-09 NOTE — ED Notes (Signed)
Service Resource called @ 1803-[facility] Ordered. 

## 2020-09-09 NOTE — Progress Notes (Signed)
ANTICOAGULATION CONSULT NOTE - Initial Consult  Pharmacy Consult for Warfarin Indication: LV thrombus  Allergies  Allergen Reactions  . Lipitor [Atorvastatin] Other (See Comments)    Muscle pain  . Sitagliptin     Other reaction(s): myalgias    Patient Measurements:    Vital Signs: Temp: 98.5 F (36.9 C) (02/19 1749) BP: 133/66 (02/20 0144) Pulse Rate: 85 (02/20 0144)  Labs: Recent Labs    09/08/20 1816 09/09/20 0127  HGB 8.5*  --   HCT 27.9*  --   PLT 783*  --   LABPROT  --  26.3*  INR  --  2.5*  CREATININE 1.19  --     CrCl cannot be calculated (Unknown ideal weight.).   Medical History: Past Medical History:  Diagnosis Date  . Arthritis   . 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 Cancer (HCC)    Skin  . Cerebral artery disease   . Coronary artery disease   . COVID-07 Dec 2019  . COVID-19 virus infection 12/02/2019  . Depression   . Essential hypertension   . Hernia, umbilical   . HOH (hard of hearing)   . Hyperlipidemia   . NSTEMI (non-ST elevated myocardial infarction) ([place])   . OSA on CPAP   . Pulmonary emboli [facility])    May 2021  . Type 2 diabetes mellitus (HCC)     Medications:  See electronic med rec  Assessment: 75 y.o. M presents with AMS, abd pain. Pt on warfarin PTA for LV thrombus. Admission INR 2.5 - therapeutic. CBC ok on admission. Home dose: 2mg  daily - last dose 2/18  Goal of Therapy:  INR 2-3 Monitor platelets by anticoagulation protocol: Yes   Plan:  Daily INR Coumadin 2.5mg  po daily  Sherlon Handing, PharmD, BCPS Please see amion for complete clinical pharmacist phone list 09/09/2020,2:57 AM

## 2020-09-09 NOTE — H&P (Signed)
History and Physical  Patient Name: Patrick Miranda     ZOX:[id number]    DOB: March 22, 1946    DOA: 09/08/2020 PCP: Burnard Bunting, MD  Patient coming from: Home  Chief Complaint: Worsening encephalopathy   HPI: Patrick Miranda is a 75 y.o. male with hx of CAD, insulin-dependent type 2 diabetes, hypertension, dyslipidemia, urinary retention, AV thrombus, paroxysmal A. fib, cardiomyopathy who presented to the hospital on 09/08/2020 secondary to worsening encephalopathy.  Patient is a poor historian, however patient's wife is bedside and able to provide some history.  Patient was recently admitted in January 2022 with complaints of nausea and vomiting.  Specific etiology of the symptoms was undetermined, however his GI symptoms improved somewhat.  However it was found that he had a large LV thrombus on echocardiogram and he was anticoagulated with Lovenox with bridge to Coumadin.  His INR was difficult to manage but currently is therapeutic.  However over the past few weeks since discharge, wife has noticed patient has not been himself, tired, slightly confused.  He was evaluated on 08/24/2020 in the ED for continued diarrhea abdominal pain, along with him not being himself.  Work-up was benign and he was discharged home.  On 2/14, GI started patient on prednisone, 40 mg daily with a slow taper for his GI symptoms.  However over the past four 5 days or so, his mentation has worsened tremendously, more encephalopathic, extremely weak, and sleeping all the time.  His sugars have been more elevated.  His wife notes his INR has been better controlled.  He continues to have some soft stool but no significant changes since starting the prednisone.  He denies any fever, dyspnea, chest pain or vomiting.  Due to the significant worsening of mental status, patient was brought to the ER for evaluation.   ED course: -Vitals on arrival: Afebrile, heart rate 94, respiratory rate 16, blood pressure 105/59,  maintaining sats on room air -Relevant labs on admission: Sodium 135, potassium 4.5, chloride 104, bicarb 21, glucose 257, BUN 21, creatinine 1.19, lipase 59, WBC 10.7, hemoglobin 8.5, platelets 783, INR 2.5, urinalysis unrevealing -CT head showed subacute infarcts in the left frontal lobe and posterior right parietal.  CT abdomen pelvis without contrast showed no acute findings but did show cholelithiasis, 3 mm nonobstructing renal stone in the left kidney -Neurology consulted by the ER -The hospitalist service was contacted for further evaluation and management.     ROS: Complete and thorough review of systems obtained, negative unless stated in the HPI      Past Medical History:  Diagnosis Date  . Arthritis   . 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 Cancer (HCC)    Skin  . Cerebral artery disease   . Coronary artery disease   . COVID-07 Dec 2019  . COVID-19 virus infection 12/02/2019  . Depression   . Essential hypertension   . Hernia, umbilical   . HOH (hard of hearing)   . Hyperlipidemia   . NSTEMI (non-ST elevated myocardial infarction) ([place])   . OSA on CPAP   . Pulmonary emboli [facility])    May 2021  . Type 2 diabetes mellitus ([place])     Past Surgical History:  Procedure Laterality Date  . COLONOSCOPY W/ POLYPECTOMY    . CORONARY ARTERY BYPASS GRAFT N/A 07/09/2020   Procedure: CORONARY ARTERY BYPASS GRAFTING (CABG) TIMES THREE , USING LEFT INTERNAL MAMMARY ARTERY AND RIGHT LEG GREATER SAPHENOUS VEIN HARVESTED ENDOSCOPICALLY;  Surgeon: Prescott Gum,  Collier Salina, MD;  Location: [place];  Service: Open Heart Surgery;  Laterality: N/A;  . LEFT HEART CATH AND CORONARY ANGIOGRAPHY N/A 07/04/2020   Procedure: LEFT HEART CATH AND CORONARY ANGIOGRAPHY;  Surgeon: Troy Sine, MD;  Location: [place] [facility];  Service: Cardiovascular;  Laterality: N/A;  . TEE WITHOUT CARDIOVERSION N/A 07/09/2020   Procedure: TRANSESOPHAGEAL ECHOCARDIOGRAM (TEE);  Surgeon: Prescott Gum, Collier Salina, MD;   Location: [place];  Service: Open Heart Surgery;  Laterality: N/A;  . TOTAL KNEE ARTHROPLASTY Left 04/22/2013   Procedure: TOTAL KNEE ARTHROPLASTY- left;  Surgeon: Augustin Schooling, MD;  Location: [place];  Service: Orthopedics;  Laterality: Left;  with femoral block  . TOTAL KNEE ARTHROPLASTY Right 01/06/2014   Procedure: RIGHT TOTAL KNEE ARTHROPLASTY;  Surgeon: Augustin Schooling, MD;  Location: [place];  Service: Orthopedics;  Laterality: Right;    Social History: Patient lives at home with wife.  The patient walks without assistance.  Non-smoker.  Allergies  Allergen Reactions  . Lipitor [Atorvastatin] Other (See Comments)    Muscle pain  . Sitagliptin     Other reaction(s): myalgias    Family history: family history includes CAD in his brother.  Prior to Admission medications   Medication Sig Start Date End Date Taking? Authorizing Provider  acetaminophen (TYLENOL) 325 MG tablet Take 2 tablets (650 mg total) by mouth every 6 (six) hours as needed for mild pain or headache (or Fever >/= 101). 08/10/20  Yes Domenic Polite, MD  aspirin EC 325 MG EC tablet Take 1 tablet (325 mg total) by mouth daily. 07/16/20  Yes Gold, Wayne E, PA-C  BD PEN NEEDLE NANO U/F 32G X 4 MM MISC 1 each by Other route daily.  03/19/18  Yes [provider]  Empagliflozin-metFORMIN HCl 11-998 MG TABS Take 1 tablet by mouth 2 (two) times daily.   Yes [provider]  fenofibrate 160 MG tablet Take 160 mg by mouth daily.   Yes [provider]  finasteride (PROSCAR) 5 MG tablet Take 5 mg by mouth daily. 08/17/20  Yes [provider]  furosemide (LASIX) 40 MG tablet TAKE 1 TABLET DAILY AS NEEDED FOR WEIGHT GAIN OF 2-3LBS IN 24 HOURS Patient taking differently: Take 40 mg by mouth See admin instructions. TAKE 1 TABLET DAILY AS NEEDED FOR WEIGHT GAIN OF 2-3LBS IN 24 HOURS 09/03/20  Yes Satira Sark, MD  HUMALOG KWIKPEN 100 UNIT/ML KwikPen Inject 5-8 Units into the skin with breakfast, with  lunch, and with evening meal. 04/27/18  Yes [provider]  hyoscyamine (ANASPAZ) 0.125 MG TBDP disintergrating tablet Place 0.125 mg under the tongue every 6 (six) hours as needed for cramping.   Yes [provider]  hyoscyamine (LEVBID) 0.375 MG 12 hr tablet Take 0.375 mg by mouth at bedtime. 08/16/20 09/15/20 Yes [provider]  loperamide (IMODIUM) 2 MG capsule Take 1 capsule (2 mg total) by mouth 4 (four) times daily as needed for up to 5 doses for diarrhea or loose stools. 08/24/20  Yes Luna Fuse, MD  metoprolol tartrate (LOPRESSOR) 25 MG tablet Take 1 tablet (25 mg total) by mouth 2 (two) times daily. 07/16/20  Yes Gold, Wayne E, PA-C  ONETOUCH VERIO test strip 1 each by Other route in the morning, at noon, and at bedtime.  03/20/18  Yes [provider]  predniSONE (DELTASONE) 20 MG tablet Take 10-40 mg by mouth See admin instructions. 40mg   Qd x 14 days, 30mg  qd x 14 days,  20 mg qd x 14 days, 10 mg qd x 14 days 09/03/20 10/29/20 Yes [provider]  rosuvastatin (CRESTOR) 20 MG tablet Take 1 tablet (20 mg total) by mouth daily. 07/16/20  Yes Gold, Patrick Jupiter E, PA-C  tamsulosin (FLOMAX) 0.4 MG CAPS capsule Take 1 capsule (0.4 mg total) by mouth daily. Patient taking differently: Take 0.4 mg by mouth at bedtime. 08/11/20  Yes Domenic Polite, MD  TOUJEO SOLOSTAR 300 UNIT/ML Solostar Pen Inject 20 Units into the skin daily. 08/10/20  Yes Domenic Polite, MD  traMADol (ULTRAM) 50 MG tablet Take 1 tablet (50 mg total) by mouth every 6 (six) hours as needed for moderate pain. 07/16/20  Yes Gold, Patrick Jupiter E, PA-C  warfarin (COUMADIN) 2 MG tablet Take 1 tablet (2 mg total) by mouth daily. 09/03/20  Yes Branch, Alphonse Guild, MD  metroNIDAZOLE (FLAGYL) 500 MG tablet Take 500 mg by mouth 3 (three) times daily. Patient not taking: Reported on 08/24/2020 08/20/20   [provider]       Physical Exam: BP 133/66 (BP Location: Right Arm)   Pulse 85   Temp 98.5 F  (36.9 C)   Resp 19   SpO2 99%  General appearance: Well-developed, adult male, alert and in no acute distress .   Eyes: Anicteric, conjunctiva pink, lids and lashes normal. PERRL.    ENT: No nasal deformity, discharge, epistaxis.  Hearing slightly impaired. OP moist without lesions.   Neck: No neck masses.  Trachea midline.  No thyromegaly/tenderness. Lymph: No cervical or supraclavicular lymphadenopathy. Skin: Warm and dry.  No jaundice.  No suspicious rashes or lesions. Cardiac: RRR, nl S1-S2, no murmurs appreciated.   1+ LE edema.  Radial and pedal pulses 2+ and symmetric. Respiratory: Normal respiratory rate and rhythm.  CTAB without rales or wheezes. Abdomen: Abdomen soft.  Slightly tender to deep palpation.  Hyperactive bowel sounds. No ascites, distension, hepatosplenomegaly.   MSK: No deformities or effusions of the large joints of the upper or lower extremities bilaterally.  No cyanosis or clubbing. Neuro: Cranial nerves two through 12 grossly intact.  Sensation intact to light touch. Speech is fluent.    Psych: Sensorium intact and responding to questions.  Behavior appropriate.  Affect WNL.  Appears to be slightly confused     Labs on Admission:  I have personally reviewed following labs and imaging studies: CBC: Recent Labs  Lab 09/08/20 1816  WBC 10.7*  HGB 8.5*  HCT 27.9*  MCV 89.4  PLT 191*   Basic Metabolic Panel: Recent Labs  Lab 09/08/20 1816  NA 135  K 4.5  CL 104  CO2 21*  GLUCOSE 257*  BUN 21  CREATININE 1.19  CALCIUM 8.1*   GFR: CrCl cannot be calculated (Unknown ideal weight.).  Liver Function Tests: Recent Labs  Lab 09/08/20 1816  AST 44*  ALT 19  ALKPHOS 52  BILITOT 0.4  PROT 4.7*  ALBUMIN 1.9*   Recent Labs  Lab 09/08/20 1816  LIPASE 59*   No results for input(s): AMMONIA in the last 168 hours. Coagulation Profile: Recent Labs  Lab 09/03/20 0000 09/09/20 0127  INR 4.1* 2.5*   Cardiac Enzymes: No results for input(s):  CKTOTAL, CKMB, CKMBINDEX, TROPONINI in the last 168 hours. BNP (last 3 results) No results for input(s): PROBNP in the last 8760 hours. HbA1C: No results for input(s): HGBA1C in the last 72 hours. CBG: Recent Labs  Lab 09/08/20 1750  GLUCAP 225*   Lipid Profile: No results for input(s): CHOL, HDL,  LDLCALC, TRIG, CHOLHDL, LDLDIRECT in the last 72 hours. Thyroid Function Tests: No results for input(s): TSH, T4TOTAL, FREET4, T3FREE, THYROIDAB in the last 72 hours. Anemia Panel: No results for input(s): VITAMINB12, FOLATE, FERRITIN, TIBC, IRON, RETICCTPCT in the last 72 hours.   Recent Results (from the past 240 hour(s))  Resp Panel by RT-PCR (Flu A&B, Covid) Nasopharyngeal Swab     Status: None   Collection Time: 09/09/20  1:27 AM   Specimen: Nasopharyngeal Swab; Nasopharyngeal(NP) swabs in vial transport medium  Result Value Ref Range Status   SARS Coronavirus 2 by RT PCR NEGATIVE NEGATIVE Final    Comment: (NOTE) SARS-CoV-2 target nucleic acids are NOT DETECTED.  The SARS-CoV-2 RNA is generally detectable in upper respiratory specimens during the acute phase of infection. The lowest concentration of SARS-CoV-2 viral copies this assay can detect is 138 copies/mL. A negative result does not preclude SARS-Cov-2 infection and should not be used as the sole basis for treatment or other patient management decisions. A negative result may occur with  improper specimen collection/handling, submission of specimen other than nasopharyngeal swab, presence of viral mutation(s) within the areas targeted by this assay, and inadequate number of viral copies(<138 copies/mL). A negative result must be combined with clinical observations, patient history, and epidemiological information. The expected result is Negative.  Fact Sheet for Patients:  EntrepreneurPulse.com.au  Fact Sheet for Healthcare Providers:  IncredibleEmployment.be  This test is no t  yet approved or cleared by the [place] FDA and  has been authorized for detection and/or diagnosis of SARS-CoV-2 by FDA under an Emergency Use Authorization (EUA). This EUA will remain  in effect (meaning this test can be used) for the duration of the COVID-19 declaration under Section 564(b)(1) of the Act, 21 U.S.C.section 360bbb-3(b)(1), unless the authorization is terminated  or revoked sooner.       Influenza A by PCR NEGATIVE NEGATIVE Final   Influenza B by PCR NEGATIVE NEGATIVE Final    Comment: (NOTE) The Xpert Xpress SARS-CoV-2/FLU/RSV plus assay is intended as an aid in the diagnosis of influenza from Nasopharyngeal swab specimens and should not be used as a sole basis for treatment. Nasal washings and aspirates are unacceptable for Xpert Xpress SARS-CoV-2/FLU/RSV testing.  Fact Sheet for Patients: EntrepreneurPulse.com.au  Fact Sheet for Healthcare Providers: IncredibleEmployment.be  This test is not yet approved or cleared by the [place] FDA and has been authorized for detection and/or diagnosis of SARS-CoV-2 by FDA under an Emergency Use Authorization (EUA). This EUA will remain in effect (meaning this test can be used) for the duration of the COVID-19 declaration under Section 564(b)(1) of the Act, 21 U.S.C. section 360bbb-3(b)(1), unless the authorization is terminated or revoked.  Performed at [facility], [place] [address] [place], [place] [address]            Radiological Exams on Admission: Personally reviewed: CT head showed subacute infarcts in the left frontal lobe and posterior right parietal.  CT abdomen pelvis without contrast showed no acute findings but did show cholelithiasis, 3 mm nonobstructing renal stone in the left kidney CT Head Wo Contrast  Result Date: 09/08/2020 CLINICAL DATA:  Mental status changes of unknown cause, confusion. History coronary artery disease post MI, hypertension,  type II diabetes mellitus, history COVID-19 in May 2021 EXAM: CT HEAD WITHOUT CONTRAST TECHNIQUE: Contiguous axial images were obtained from the base of the skull through the vertex without intravenous contrast. Sagittal and coronal MPR images reconstructed from axial data set. COMPARISON:  08/24/2020 FINDINGS: Brain: Generalized atrophy. Normal ventricular morphology. No midline shift or mass effect. Evolving subacute infarct LEFT frontal lobe, better defined than on previous exam. Subacute to old posterior RIGHT parietal infarct little changed. Small vessel chronic ischemic changes of deep cerebral white matter. No intracranial hemorrhage, mass lesion, or additional infarct identified. No extra-axial fluid collections. Vascular: No hyperdense vessels. Atherosclerotic calcifications of internal carotid arteries bilaterally at skull base Skull: Intact Sinuses/Orbits: Clear Other: N/A IMPRESSION: Evolving subacute infarct LEFT frontal lobe since prior study. Subacute to old posterior RIGHT parietal infarct little changed. Atrophy with small vessel chronic ischemic changes of deep cerebral white matter. No new intracranial abnormalities. Electronically Signed   By: Lavonia Dana M.D.   On: 09/08/2020 19:19   CT Renal Stone Study  Result Date: 09/09/2020 CLINICAL DATA:  Flank pain. EXAM: CT ABDOMEN AND PELVIS WITHOUT CONTRAST TECHNIQUE: Multidetector CT imaging of the abdomen and pelvis was performed following the standard protocol without IV contrast. COMPARISON:  August 24, 2020 FINDINGS: Lower chest: Sternal wires are present. Very mild linear atelectasis is noted within the bilateral lung bases. Hepatobiliary: No focal liver abnormality is seen. Multiple tiny gallstones are seen within the partially contracted gallbladder. Pancreas: Unremarkable. No pancreatic ductal dilatation or surrounding inflammatory changes. Spleen: Normal in size without focal abnormality. Adrenals/Urinary Tract: Adrenal glands are  unremarkable. Kidneys are normal in size. Multiple bilateral parapelvic renal cysts are seen a 3 mm nonobstructing renal stone is seen within the posterior aspect of the mid left kidney. Bladder is unremarkable. Stomach/Bowel: Stomach is within normal limits. Appendix appears normal. No evidence of bowel wall thickening, distention, or inflammatory changes. Noninflamed diverticula are seen within the sigmoid colon. Vascular/Lymphatic: Aortic atherosclerosis. No enlarged abdominal or pelvic lymph nodes. Reproductive: The prostate gland is mildly enlarged. Other: There is a 2.5 cm x 2.7 cm fat containing umbilical hernia. No abdominopelvic ascites. Musculoskeletal: Marked severity degenerative changes seen at the level of L4-L5. IMPRESSION: 1. Cholelithiasis. 2. 3 mm nonobstructing renal stone within the left kidney. 3. Multiple bilateral parapelvic renal cysts. 4. Sigmoid diverticulosis. 5. Fat-containing umbilical hernia. 6. Marked severity degenerative changes at the level of L4-L5. 7. Aortic atherosclerosis. Aortic Atherosclerosis (ICD10-I70.0). Electronically Signed   By: Virgina Norfolk M.D.   On: 09/09/2020 02:12       Assessment/Plan   1.  Metabolic encephalopathy -Likely multifactorial including subacute CVA and recently starting prednisone -CT head showed evolving subacute infarct left frontal lobe and subacute to old posterior right parietal infarct -Neurology consulted in the ED -MRI of the brain ordered -Hold home prednisone -Repeat echocardiogram -Telemetry -PT and OT consulted -Continue home aspirin and statin -N.p.o. until bedside swallow eval -Encourage sleeping at night and staying awake during the day  2.  Subacute CVAs, POA -CT head showed evolving subacute infarct left frontal lobe and subacute to old posterior right parietal infarct -See further plans above  3.  LV thrombus -Recent CABG, echo on 1/17 noted EF of 45% with large basal inferior and inferior septal  aneurysm and probable LV thrombus measuring 1.5X 1.5 cm -Continue home warfarin -INR daily -Pharmacy consulted to help with management of warfarin  4.  Chronic abdominal pain, diarrhea -Hold home prednisone due to concern for causing encephalopathy -continue home levbid -Continue home Imodium as needed  5.  A. Fib, paroxysmal -Continue home metoprolol -Continue anticoagulation with home warfarin  6.  Insulin-dependent type 2 diabetes -Currently above goal.  Likely due to steroids -Continue home Toujeo, 20 units daily -Glucose  checks, sliding scale  7.  Previous history of urinary retention -Continue home Flomax and finasteride     DVT prophylaxis: On full dose warfarin for LV thrombus Code Status: Full Family Communication: Discussed with wife bedside Disposition Plan: Anticipate discharge home when able Consults called: Neurology consulted in the ER Admission status: Inpatient    Medical decision making: Patient seen at 2:50 AM on 09/09/2020.  The patient was discussed with ER provider.  What exists of the patient's chart was reviewed in depth and summarized above.  Clinical condition:watcher.        Doran Heater [facility] Please page though [place] or Epic secure chat:  For password, contact charge nurse

## 2020-09-09 NOTE — ED Notes (Signed)
Patient via wheelchair to 028C. Pts wife reports diarrhea x past couple of days. A&Ox4. NAD. Respirations regular/unlabored. Connected to cardiac monitor, pulse ox, bp. Stretcher low, wheels locked, call bell within reach. Family at bedside.

## 2020-09-09 NOTE — Consult Note (Signed)
Neurology Consultation Reason for Consult: AMS Referring Physician: Palumbo, A  CC: Altered mental status  History is obtained from: Patient, wife  HPI: Patrick Miranda is a 75 y.o. male with a history of a stroke "seven or 8 years ago" who is presenting with altered mental status present for a couple of weeks but worsening over the past 4 days.  His wife states that she noticed "sometimes" up to 2 weeks ago but over the past 4 days he has done really odd behaviors.  She describes him responding to multiple situations in inappropriate ways such as looking for a place to put the chili into the refrigerator, but there was no chili.  He has also been more somnolent.  Of note, 6 days ago he was started on prednisone.  He was admitted in December for a cardiac bypass surgery.  He subsequently was found to have a left ventricular thrombus and started on full dose Lovenox on 1/19.  As part of an evaluation for a fall when he  presented on 2/4 with abdominal pain, a head CT was performed which showed an age-indeterminate infarct in the left frontal region.  When he presented this time with confusion, a repeat head CT was obtained which shows evolution of frontal infarct suggestive of a subacute nature.  LKW: at least two weeks ago tpa given?: no, out of window.     ROS: A 14 point ROS was performed and is negative except as noted in the HPI.   Past Medical History:  Diagnosis Date  . Arthritis   . 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 Cancer (HCC)    Skin  . Cerebral artery disease   . Coronary artery disease   . COVID-07 Dec 2019  . COVID-19 virus infection 12/02/2019  . Depression   . Essential hypertension   . Hernia, umbilical   . HOH (hard of hearing)   . Hyperlipidemia   . NSTEMI (non-ST elevated myocardial infarction) ([place])   . OSA on CPAP   . Pulmonary emboli [facility])    May 2021  . Type 2 diabetes mellitus (HCC)      Family History  Problem Relation Age of Onset  . CAD  Brother        Premature     Social History:  reports that he has never smoked. He has never used smokeless tobacco. He reports that he does not drink alcohol and does not use drugs.   Exam: Current vital signs: BP 133/66 (BP Location: Right Arm)   Pulse 85   Temp 98.5 F (36.9 C)   Resp 19   SpO2 99%  Vital signs in last 24 hours: Temp:  [98.5 F (36.9 C)] 98.5 F (36.9 C) (02/19 1749) Pulse Rate:  [84-94] 85 (02/20 0144) Resp:  [14-19] 19 (02/20 0144) BP: (105-133)/(59-67) 133/66 (02/20 0144) SpO2:  [96 %-99 %] 99 % (02/20 0144)   Physical Exam  Constitutional: Appears well-developed and well-nourished.  Psych: Affect appropriate to situation Eyes: No scleral injection HENT: No OP obstruction MSK: no joint deformities.  Cardiovascular: Normal rate and regular rhythm.  Respiratory: Effort normal, non-labored breathing GI: Soft.  No distension. There is no tenderness.  Skin: WDI  Neuro: Mental Status: Patient is awake, alert, oriented to person, but unable to give  month, year.  He is unable to spell world forwards, much less  Backwards, when I asked the number of quarters for $2.75, he responds "That's four too." He is  able ot name simple objects and able to repeat.  Cranial Nerves: II: Visual Fields are full. Pupils are equal, round, and reactive to light.   III,IV, VI: EOMI without ptosis or diploplia.  V: Facial sensation is symmetric to temperature VII: ? Decreased R NL fold.  VIII: hearing is intact to voice X: Uvula elevates symmetrically XI: Shoulder shrug is symmetric. XII: tongue is midline without atrophy or fasciculations.  Motor: Tone is normal. Bulk is normal. 5/5 strength was present in all four extremities.  Sensory: Sensation is symmetric to light touch and temperature in the arms and legs. Cerebellar: FNF  intact bilaterally    I have reviewed labs in epic and the results pertinent to this consultation are: Glucose 257  I have reviewed  the images obtained: CT head-evolving left frontal infarct and chronic right parietal  Impression: 75 year old male with confusion in the setting of left ventricular thrombus and subacute left frontal infarct.  The timing of the worsening is suspicious that the prednisone may be playing a role in his worsening, but with the symptoms preceding this I think that the stroke is likely the major underlying culprit.  That being said, I think that the prednisone is certainly a possibility as far as the timing of his worsening.  Recurrent small strokes is also a possibility, and I think an MRI could be helpful.  With his diarrhea, I would favor checking a B12 level, and also starting thiamine repletion after checking a thiamine level as well.  Recommendations: 1) continue anticoagulation for left ventricular thrombus 2) MRI brain 3) MR angio head neck 4) lipids, A1c 5) B12, B1, TSH, ammonia 6) thiamine 100 mg daily(would prefer at least a dose or two of IV) 7) PT, OT, ST 8) stroke team to follow   Roland Rack, MD [facility] [phone]  If 7pm- 7am, please page neurology on call as listed in [place].

## 2020-09-09 NOTE — ED Notes (Signed)
Pt to MRI

## 2020-09-09 NOTE — Progress Notes (Addendum)
STROKE TEAM PROGRESS NOTE   INTERVAL HISTORY No acute events since arrival.  Briefly:  S/p 3V CABG December 2021  Admission for N/V Jan 13 with mild leukocytosis without clear explanation but clinically improved  LV thrombus, started Lovenox 08/08/20 and transitioned to coumadin with difficulty regulating  1/27 [facility] [place] [facility] outpt visit: low blood pressure noted, Cdiff/infectious studies sent and negative. 20 lbs weight loss since December noted.  ED on  2/4 for fall and abd pain. Found to have  age-indeterminate infarct in the left frontal region on HCT.   2/14 Started prednisone taper for GI issues   Patient denies complaints or concerns. He denies any vision changes, new numbness, loss of sensation or function in arms or legs. When prompted he states he is having difficulty thinking. He feels slow. Wife at bedside without new concerns since arrival. She reports that the mental status changes seemed to onset at least a few days to a week before the prednisone was started. She reports that he cannot function at home and can't even write out or sign a check unable to independently manage simple every day tasks. She relates frustration and exhaustion in trying to sort out her husband's new medical issues over the past month. The ongoing diarrhea and mental status changes have been very challenging for her to manage and have caused her a great deal of worry.   Vitals:   09/09/20 0144 09/09/20 0649 09/09/20 0719 09/09/20 0720  BP: 133/66 104/89  (!) 118/58  Pulse: 85 65  70  Resp: 19 13  18   Temp:    97.7 F (36.5 C)  TempSrc:    Oral  SpO2: 99% 93%  96%  Weight:   71.2 kg   Height:   5\' 10"  (1.778 m)    CBC:  Recent Labs  Lab 09/08/20 1816  WBC 10.7*  HGB 8.5*  HCT 27.9*  MCV 89.4  PLT 889*   Basic Metabolic Panel:  Recent Labs  Lab 09/08/20 1816  NA 135  K 4.5  CL 104  CO2 21*  GLUCOSE 257*  BUN 21  CREATININE 1.19  CALCIUM 8.1*   Lipid Panel:  Recent Labs  Lab  09/09/20 0402  CHOL 65  TRIG 94  HDL 21*  CHOLHDL 3.1  VLDL 19  LDLCALC 25   HgbA1c:  Recent Labs  Lab 09/09/20 0258  HGBA1C 6.8*   Urine Drug Screen: No results for input(s): LABOPIA, COCAINSCRNUR, LABBENZ, AMPHETMU, THCU, LABBARB in the last 168 hours.  Alcohol Level No results for input(s): ETH in the last 168 hours.  IMAGING past 24 hours  PHYSICAL EXAM  Physical Exam  Constitutional: Appears well-developed and well-nourished.  Psych: Calm Eyes: No scleral injection HENT: No OP obstruction MSK: no joint deformities.  Card: regular rhythm on tele Respiratory: Effort normal, non-labored breathing  Skin: WDI  Neuro: Mental Status: Patient is alert, calm, cooperative, sitting up on stretcher in NAD.  Oriented to person only. Speaking clearly with some hesitance. Response time is slowed.  He is able to follow a two step command.  Unable to add 2 numbers or subtract one.  Naming intact 3/3 objects. Memory is impaired. Unable to provide history. Recalls 2/3 words.  Cranial Nerves: II: Visual Fields are full. Pupils are equal, round, and reactive to light.   III,IV, VI: EOMI without ptosis or diploplia.  V: Facial sensation is symmetric to temperature VII: ? Decreased R NL fold.  VIII: hearing is intact to voice X: Uvula  elevates symmetrically XI: Shoulder shrug is symmetric. XII: tongue is midline without atrophy or fasciculations.  Motor: Tone is normal. Bulk is normal. 5/5 strength was present in all four extremities.  Sensory: Sensation is symmetric to light touch x 4 extremities  Cerebellar: RAMs intact BUE  Clock drawing test:       ASSESSMENT/PLAN  Patrick Miranda is a 75 y.o. 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 Presented  with altered mental status, odd behavior present for a couple of weeks but worsening over the past 4 days.  His wife states that she noticed "sometimes" up to 2 weeks ago but over the past 4 days he has done really odd behaviors.  She describes him responding to multiple situations in inappropriate ways such as looking for a place to put the chili into the refrigerator, but there was no chili.  He has also been more somnolent.  Of note, 6 days ago he was started on prednisone for ongoing GI issues.  He was admitted in December for a cardiac bypass surgery.  He subsequently was found to have a left ventricular thrombus and started on full dose Lovenox on 1/19.  As part of an evaluation for a fall when he  presented on 2/4 with abdominal pain, a head CT was performed which showed an age-indeterminate infarct in the left frontal region.  When he presented this time with confusion, a repeat head CT was obtained which shows evolution of frontal infarct suggestive of a subacute nature.   Stroke: chronic left frontal and right parietal infarcts, likely due to CABG in 06/2020 vs. PAF vs. LV thrombus, currently on coumadin with difficult INR control  Code Stroke HCT Evolving subacute infarct LEFT frontal lobe since prior study. Subacute to old posterior RIGHT parietal infarct little changed.   Brain MRI: Areas of altered diffusion signal are related to remote infarcts scattered along the cerebral convexities. Chronic small vessel ischemia including chronic pontine and left thalamic lacunes.  Intracranial MRA: Flow reducing stenosis at the non dominant left V4 segment.   2D Echo Pending. Last study on Jan 17th showed EF of 45% with basal and inferior septal aneurysm and probable LV thrombus  EEG Pending  LDL 25  HgbA1c 6.8  VTE prophylaxis: on Coumadin  On  warfarin with apparent difficulty regulating per chart review. Recommend changing Coumadin to Eliquis for reliable anticoagulation. Primary team to discuss with cardiology.   Therapy recommendations:  TBD  Disposition:  TBD  CAD s/p CABG 06/2020 LV thrombus 07/2020  Likely the cause of current 2 subacute to chronic infarcts on CT and MRI  Currently on coumadin  INR this admission 2.5  Historical difficulty INR control   Recommend changing Coumadin to Eliquis for reliable anticoagulation. Primary team to discuss with cardiology.  COVID 11/2019 PE  Was on eliquis for 6 months   Stopped in 06/2020  PAF  In the setting of COVID and post CABG  Currently NSR  Now on coumadin with difficult INR  May consider switch to eliquis  Cognitive impairment  Vascular dementia  Examples of odd behavior reviewed (wife provided event examples on her records)  Consistent with cognitive impairment  Likely vascular dementia worsened after strokes.  Recommend outpt neurology follow up for neuropsych testing.  Chronic abdominal pain and diarrhea incidental cholelithiasis  20 lb weight loss since December.  Plan to pursue Upper and Lower endoscopy as outpt after cardiology clearance.   Also concerning for GI infarction due to LV thrombus  Management by primary team  Hypertension  Bp overall soft since admission with systolic mostly in 253G ranging from 104-133/54-89 . Avoid low BP . Long-term BP goal normotensive  Hyperlipidemia  Home meds: Crestor 20mg   LDL 25, goal < 70  Now on home crestor 20  Continue statin at discharge  Diabetes type II Uncontrolled  HgbA1c 6.8, goal < 7.0   on Insulin   CBGs  SSI  PCP follow up  Other Stroke Risk Factors  Advanced Age   Hx stroke/TIA - pontine infarct in 2013 - no residue  Obstructive sleep apnea  Other Active Problems  Chronic anemia, Hb 8.5  Hospital day # 0  Delila A Bailey-Modzik, NP-C   ATTENDING  NOTE: I reviewed above note and agree with the assessment and plan. Pt was seen and examined.   Wife at bedside, and recounted HPI with me. He had COVID in 11/2019 with PE and transient afib, he was on eliquis until 06/2020. then he had CABG surgery with post op transient afib and discharged with ASA. in 07/2020 he had LV thrombus and was on heparin IV then lovenox bridged with coumadin. He has difficult INR control on coumadin. His stroke seen on CT and MRI likely happened with CABG procedure or PAF or LV thrombus before full dose anticoagulation, therefore the stroke seems to be chronic.   The symptoms for his admission per wife description and recorded on the paper are consistent with cognitive impairment and vascular dementia. Wife also admitted that those symptoms were likely for the last 1-2 months but now she is more paying attention to them. This could be due to his underlying cognitive impairment in the setting of new strokes.   On exam, pt lying in bed, no distress. Awake alert, orientated to place and people but not to month, year or his age. No aphasia, able to name and repeat and following simple commands. 3/3 registration and 1/3 delayed recall. Otherwise neuro intact.   Assessment and plan please refer to above which I have made changes when necessary. Will recommend switch coumadin to eliquis for better anticoagulation. Primary team will discuss with cardiology. Will also refer to outpt neurology follow up for neuropsych testing. Pending speech cognitive evaluation. Continue crestor and fenofibrate. Discussed with Dr. [place] Pink. Will follow.  Rosalin Hawking, MD PhD Stroke Neurology 09/09/2020 3:28 PM     To contact Stroke Continuity provider, please refer to [email or web address] After hours, contact General Neurology

## 2020-09-09 NOTE — Progress Notes (Signed)
  Echocardiogram 2D Echocardiogram has been performed.  Patrick Miranda 09/09/2020, 5:37 PM

## 2020-09-09 NOTE — ED Notes (Signed)
This RN spoke with admitting MD, Dr. Maryland Pink about pt passing swallow screen. Per Dr. Maryland Pink because pt is here for AMS he wants pt placed on dysphagia 3 diet and for a formal speech eval to be performed. This RN placed diet order but per Dr. Maryland Pink he will place speech order. Will continue to monitor.

## 2020-09-09 NOTE — Progress Notes (Signed)
[facility] PROGRESS NOTE   Patrick Miranda HCW:[id number] DOB: [birth year] DOA: 09/08/2020  PCP: Burnard Bunting, MD  Brief History/Interval Summary: 75 y.o. male with hx of CAD, insulin-dependent type 2 diabetes, hypertension, dyslipidemia, urinary retention, AV thrombus, paroxysmal A. fib, cardiomyopathy who presented to the hospital on 09/08/2020 secondary to worsening encephalopathy. Patient was started on prednisone by his gastroenterologist for ongoing GI issues about 6 days prior to admission. CT scan of the head raised concern for acute stroke. Patient was seen by neurology. Hospitalized for further management.  Reason for Visit: Acute metabolic encephalopathy  Consultants: Neurology  Procedures: EEG and echocardiogram has been ordered  Antibiotics: Anti-infectives (From admission, onward)   None      Subjective/Interval History: Patient's wife is at the bedside. Patient is mildly distracted. He however denies any abdominal pain or nausea currently. No chest pain or shortness of breath. Denies any weakness of any one side of the body. His wife feels that he still not quite back to his baseline. Still confused according to her.     Assessment/Plan:  Acute metabolic encephalopathy Thought to be multifactorial including stroke and medication induced. However MRI brain did not show any acute stroke. Remote infarcts were noted. Patient was started on prednisone about 6 days ago. Prednisone seems to be the most likely culprit. We will also proceed with EEG. Await for further neurology recommendations. PT and OT evaluation. Patient currently does not have any focal deficits. Aspirin and statin being continued. Ammonia level was normal. Infectious work-up unremarkable so far. Speech therapy to evaluate.  History of stroke Continue antiplatelet agent along with statin.Marland Kitchen MRI does not show any acute stroke. LDL is 25. HbA1c 6.8  History of for left ventricular  thrombus Patient on warfarin. INR was 2.5 earlier this morning. Coumadin per pharmacy.  Coronary artery disease status post CABG He recently underwent CABG in December. He is followed by Dr. Domenic Polite who is his cardiologist. Last echocardiogram on 1/17 showed a EF of 45% with basal and inferior septal aneurysm and probable LV thrombus. Seems to be stable from a cardiac standpoint. Continue beta-blocker, aspirin, statin.  Chronic abdominal pain and diarrhea/incidental cholelithiasis Followed at [facility] gastro[facility]ology, Dr. Earlean Shawl. Started on prednisone recently for GI issues. Apparently he has soft stools but very frequent. His abdomen is benign. CT scan renal study did not show any acute intra-abdominal process. Cholelithiasis was noted incidentally but patient's abdomen is benign. LFTs unremarkable. According to the wife the plan is to pursue upper endoscopy and Colonoscopy in the near future once cleared by his cardiologist.  Incidental left renal stone Outpatient monitoring. Currently asymptomatic.  History of paroxysmal atrial fibrillation Metoprolol being continued. He is anticoagulated with warfarin.  Diabetes mellitus type 2, uncontrolled with hyperglycemia Noted to be on Lantus and SSI. Monitor CBGs. HbA1c 6.8. Poorly controlled CBGs most likely due to steroids. Since steroids have been discontinued likely get better.  Previous history of urinary retention Continue Flomax and finasteride. Has been able to urinate here in the hospital.  Normocytic anemia Seems to be stable. No evidence of overt blood loss. Supposed to get an iron infusion in the near future.   DVT Prophylaxis: On warfarin currently Code Status: Full code Family Communication: Discussed with the patient and his wife Disposition Plan: Await PT and OT evaluation. Hopefully will be able to return home when improved  Status is: Inpatient  Remains inpatient appropriate because:Altered mental status, Ongoing  diagnostic testing needed not appropriate for outpatient work up  and Inpatient level of care appropriate due to severity of illness   Dispo: The patient is from: Home              Anticipated d/c is to: Home              Anticipated d/c date is: 2 days              Patient currently is not medically stable to d/c.   Difficult to place patient No      Medications:  Scheduled: . aspirin  325 mg Oral Daily  . fenofibrate  160 mg Oral Daily  . finasteride  5 mg Oral Daily  . hyoscyamine  0.375 mg Oral QHS  . insulin aspart  0-15 Units Subcutaneous TID WC  . insulin aspart  0-5 Units Subcutaneous QHS  . insulin glargine (1 Unit Dial)  20 Units Subcutaneous Daily  . metoprolol tartrate  25 mg Oral BID  . rosuvastatin  20 mg Oral Daily  . tamsulosin  0.4 mg Oral QHS  . thiamine injection  100 mg Intravenous Daily  . warfarin  2 mg Oral q1600  . Warfarin - Pharmacist Dosing Inpatient   Does not apply q1600   Continuous:  DQQ:[id number] **OR** acetaminophen (TYLENOL) oral liquid 160 mg/5 mL **OR** acetaminophen, loperamide   Objective:  Vital Signs  Vitals:   09/09/20 0144 09/09/20 0649 09/09/20 0719 09/09/20 0720  BP: 133/66 104/89  (!) 118/58  Pulse: 85 65  70  Resp: 19 13  18   Temp:    97.7 F (36.5 C)  TempSrc:    Oral  SpO2: 99% 93%  96%  Weight:   71.2 kg   Height:   5\' 10"  (1.778 m)    No intake or output data in the 24 hours ending 09/09/20 0851 Filed Weights   09/09/20 0719  Weight: 71.2 kg    General appearance: Awake alert.  In no distress. Mildly distracted Resp: Clear to auscultation bilaterally.  Normal effort Cardio: S1-S2 is normal regular.  No S3-S4.  No rubs murmurs or bruit GI: Abdomen is soft.  Nontender nondistended.  Bowel sounds are present normal.  No masses organomegaly Extremities: Mild edema noted bilateral lower extremity.  Full range of motion of lower extremities. Neurologic: He is alert. Oriented to person place. Did not know the  year. No facial asymmetry. Tongue is midline. No pronator drift. Motor strength is equal bilateral upper and lower extremities    Lab Results:  Data Reviewed: I have personally reviewed following labs and imaging studies  CBC: Recent Labs  Lab 09/08/20 1816  WBC 10.7*  HGB 8.5*  HCT 27.9*  MCV 89.4  PLT 783*    Basic Metabolic Panel: Recent Labs  Lab 09/08/20 1816  NA 135  K 4.5  CL 104  CO2 21*  GLUCOSE 257*  BUN 21  CREATININE 1.19  CALCIUM 8.1*    GFR: Estimated Creatinine Clearance: 54 mL/min (by C-G formula based on SCr of 1.19 mg/dL).  Liver Function Tests: Recent Labs  Lab 09/08/20 1816  AST 44*  ALT 19  ALKPHOS 52  BILITOT 0.4  PROT 4.7*  ALBUMIN 1.9*    Recent Labs  Lab 09/08/20 1816  LIPASE 59*   Recent Labs  Lab 09/09/20 0402  AMMONIA 22    Coagulation Profile: Recent Labs  Lab 09/03/20 0000 09/09/20 0127  INR 4.1* 2.5*     HbA1C: Recent Labs    09/09/20 0258  HGBA1C 6.8*  CBG: Recent Labs  Lab 09/08/20 1750 09/09/20 0718  GLUCAP 225* 83    Lipid Profile: Recent Labs    09/09/20 0402  CHOL 65  HDL 21*  LDLCALC 25  TRIG 94  CHOLHDL 3.1     Recent Results (from the past 240 hour(s))  Resp Panel by RT-PCR (Flu A&B, Covid) Nasopharyngeal Swab     Status: None   Collection Time: 09/09/20  1:27 AM   Specimen: Nasopharyngeal Swab; Nasopharyngeal(NP) swabs in vial transport medium  Result Value Ref Range Status   SARS Coronavirus 2 by RT PCR NEGATIVE NEGATIVE Final    Comment: (NOTE) SARS-CoV-2 target nucleic acids are NOT DETECTED.  The SARS-CoV-2 RNA is generally detectable in upper respiratory specimens during the acute phase of infection. The lowest concentration of SARS-CoV-2 viral copies this assay can detect is 138 copies/mL. A negative result does not preclude SARS-Cov-2 infection and should not be used as the sole basis for treatment or other patient management decisions. A negative result may  occur with  improper specimen collection/handling, submission of specimen other than nasopharyngeal swab, presence of viral mutation(s) within the areas targeted by this assay, and inadequate number of viral copies(<138 copies/mL). A negative result must be combined with clinical observations, patient history, and epidemiological information. The expected result is Negative.  Fact Sheet for Patients:  EntrepreneurPulse.com.au  Fact Sheet for Healthcare Providers:  IncredibleEmployment.be  This test is no t yet approved or cleared by the [place] FDA and  has been authorized for detection and/or diagnosis of SARS-CoV-2 by FDA under an Emergency Use Authorization (EUA). This EUA will remain  in effect (meaning this test can be used) for the duration of the COVID-19 declaration under Section 564(b)(1) of the Act, 21 U.S.C.section 360bbb-3(b)(1), unless the authorization is terminated  or revoked sooner.       Influenza A by PCR NEGATIVE NEGATIVE Final   Influenza B by PCR NEGATIVE NEGATIVE Final    Comment: (NOTE) The Xpert Xpress SARS-CoV-2/FLU/RSV plus assay is intended as an aid in the diagnosis of influenza from Nasopharyngeal swab specimens and should not be used as a sole basis for treatment. Nasal washings and aspirates are unacceptable for Xpert Xpress SARS-CoV-2/FLU/RSV testing.  Fact Sheet for Patients: EntrepreneurPulse.com.au  Fact Sheet for Healthcare Providers: IncredibleEmployment.be  This test is not yet approved or cleared by the [place] FDA and has been authorized for detection and/or diagnosis of SARS-CoV-2 by FDA under an Emergency Use Authorization (EUA). This EUA will remain in effect (meaning this test can be used) for the duration of the COVID-19 declaration under Section 564(b)(1) of the Act, 21 U.S.C. section 360bbb-3(b)(1), unless the authorization is terminated  or revoked.  Performed at [facility], [place] [address], [place], [place] [address]       Radiology Studies: CT Head Wo Contrast  Result Date: 09/08/2020 CLINICAL DATA:  Mental status changes of unknown cause, confusion. History coronary artery disease post MI, hypertension, type II diabetes mellitus, history COVID-19 in May 2021 EXAM: CT HEAD WITHOUT CONTRAST TECHNIQUE: Contiguous axial images were obtained from the base of the skull through the vertex without intravenous contrast. Sagittal and coronal MPR images reconstructed from axial data set. COMPARISON:  08/24/2020 FINDINGS: Brain: Generalized atrophy. Normal ventricular morphology. No midline shift or mass effect. Evolving subacute infarct LEFT frontal lobe, better defined than on previous exam. Subacute to old posterior RIGHT parietal infarct little changed. Small vessel chronic ischemic changes of deep cerebral white matter. No  intracranial hemorrhage, mass lesion, or additional infarct identified. No extra-axial fluid collections. Vascular: No hyperdense vessels. Atherosclerotic calcifications of internal carotid arteries bilaterally at skull base Skull: Intact Sinuses/Orbits: Clear Other: N/A IMPRESSION: Evolving subacute infarct LEFT frontal lobe since prior study. Subacute to old posterior RIGHT parietal infarct little changed. Atrophy with small vessel chronic ischemic changes of deep cerebral white matter. No new intracranial abnormalities. Electronically Signed   By: Lavonia Dana M.D.   On: 09/08/2020 19:19   MR ANGIO HEAD WO CONTRAST  Result Date: 09/09/2020 CLINICAL DATA:  Altered mental status for a few weeks EXAM: MRI HEAD WITHOUT CONTRAST MRA HEAD WITHOUT CONTRAST MRA NECK WITHOUT CONTRAST TECHNIQUE: Multiplanar, multiecho pulse sequences of the brain and surrounding structures were obtained without intravenous contrast. Angiographic images of the Circle of Willis were obtained using MRA technique without intravenous  contrast. Angiographic images of the neck were obtained using MRA technique without intravenous contrast. Carotid stenosis measurements (when applicable) are obtained utilizing NASCET criteria, using the distal internal carotid diameter as the denominator. COMPARISON:  None. FINDINGS: MRI HEAD FINDINGS Brain: Patchy areas of increased T1 signal in the right temporal occipital cortex but chronic appearing on the other sequences demonstrating encephalomalacia and cortical laminar necrosis. Increased diffusion signal around the periphery of a clearly chronic anterior left frontal infarct which shows cortical laminar necrosis and dense encephalomalacia. No acute hemorrhage, hydrocephalus, or collection. Mild for age cerebral volume loss. Chronic small vessel ischemia with ischemic gliosis in the white matter and pons. Chronic lacune is at the right pons and left thalamus. Accentuated dilated perivascular spaces. Vascular: Preserved flow voids. Skull and upper cervical spine: Normal marrow signal Sinuses/Orbits: Unremarkable MRA HEAD FINDINGS Right dominant vertebral artery. 60% stenosis at the proximal left V4 segment. The basilar is diffusely patent. Hypoplastic left P1 segment with fetal type PCA flow. No branch occlusion, beading, or aneurysm in the posterior circulation. Symmetric carotid artery size. No branch occlusion, beading, or proximal flow limiting stenosis. Mild narrowing at the anterior genu of the right cavernous ICA. Mild left M1 segment narrowing. Possible generalized atheromatous irregularity of MCA branches. MRA NECK FINDINGS Normal caliber of the aortic arch. Two vessel arch branching. The carotid and vertebral arteries are smoothly contoured and widely patent in the neck. Limited assessment of the left vertebral origin due to small vessel size and typical time-of-flight artifact. IMPRESSION: Brain MRI: 1. No acute finding. Areas of altered diffusion signal are related to remote infarcts scattered  along the cerebral convexities. 2. Chronic small vessel ischemia including chronic pontine and left thalamic lacunes. Intracranial MRA: 1. Flow reducing stenosis at the non dominant left V4 segment. 2. Mild atheromatous changes in the anterior circulation. Neck MRA: Negative. Electronically Signed   By: Monte Fantasia M.D.   On: 09/09/2020 07:26   MR ANGIO NECK WO CONTRAST  Result Date: 09/09/2020 CLINICAL DATA:  Altered mental status for a few weeks EXAM: MRI HEAD WITHOUT CONTRAST MRA HEAD WITHOUT CONTRAST MRA NECK WITHOUT CONTRAST TECHNIQUE: Multiplanar, multiecho pulse sequences of the brain and surrounding structures were obtained without intravenous contrast. Angiographic images of the Circle of Willis were obtained using MRA technique without intravenous contrast. Angiographic images of the neck were obtained using MRA technique without intravenous contrast. Carotid stenosis measurements (when applicable) are obtained utilizing NASCET criteria, using the distal internal carotid diameter as the denominator. COMPARISON:  None. FINDINGS: MRI HEAD FINDINGS Brain: Patchy areas of increased T1 signal in the right temporal occipital cortex but chronic appearing on the  other sequences demonstrating encephalomalacia and cortical laminar necrosis. Increased diffusion signal around the periphery of a clearly chronic anterior left frontal infarct which shows cortical laminar necrosis and dense encephalomalacia. No acute hemorrhage, hydrocephalus, or collection. Mild for age cerebral volume loss. Chronic small vessel ischemia with ischemic gliosis in the white matter and pons. Chronic lacune is at the right pons and left thalamus. Accentuated dilated perivascular spaces. Vascular: Preserved flow voids. Skull and upper cervical spine: Normal marrow signal Sinuses/Orbits: Unremarkable MRA HEAD FINDINGS Right dominant vertebral artery. 60% stenosis at the proximal left V4 segment. The basilar is diffusely patent.  Hypoplastic left P1 segment with fetal type PCA flow. No branch occlusion, beading, or aneurysm in the posterior circulation. Symmetric carotid artery size. No branch occlusion, beading, or proximal flow limiting stenosis. Mild narrowing at the anterior genu of the right cavernous ICA. Mild left M1 segment narrowing. Possible generalized atheromatous irregularity of MCA branches. MRA NECK FINDINGS Normal caliber of the aortic arch. Two vessel arch branching. The carotid and vertebral arteries are smoothly contoured and widely patent in the neck. Limited assessment of the left vertebral origin due to small vessel size and typical time-of-flight artifact. IMPRESSION: Brain MRI: 1. No acute finding. Areas of altered diffusion signal are related to remote infarcts scattered along the cerebral convexities. 2. Chronic small vessel ischemia including chronic pontine and left thalamic lacunes. Intracranial MRA: 1. Flow reducing stenosis at the non dominant left V4 segment. 2. Mild atheromatous changes in the anterior circulation. Neck MRA: Negative. Electronically Signed   By: Monte Fantasia M.D.   On: 09/09/2020 07:26   MR BRAIN WO CONTRAST  Result Date: 09/09/2020 CLINICAL DATA:  Altered mental status for a few weeks EXAM: MRI HEAD WITHOUT CONTRAST MRA HEAD WITHOUT CONTRAST MRA NECK WITHOUT CONTRAST TECHNIQUE: Multiplanar, multiecho pulse sequences of the brain and surrounding structures were obtained without intravenous contrast. Angiographic images of the Circle of Willis were obtained using MRA technique without intravenous contrast. Angiographic images of the neck were obtained using MRA technique without intravenous contrast. Carotid stenosis measurements (when applicable) are obtained utilizing NASCET criteria, using the distal internal carotid diameter as the denominator. COMPARISON:  None. FINDINGS: MRI HEAD FINDINGS Brain: Patchy areas of increased T1 signal in the right temporal occipital cortex but chronic  appearing on the other sequences demonstrating encephalomalacia and cortical laminar necrosis. Increased diffusion signal around the periphery of a clearly chronic anterior left frontal infarct which shows cortical laminar necrosis and dense encephalomalacia. No acute hemorrhage, hydrocephalus, or collection. Mild for age cerebral volume loss. Chronic small vessel ischemia with ischemic gliosis in the white matter and pons. Chronic lacune is at the right pons and left thalamus. Accentuated dilated perivascular spaces. Vascular: Preserved flow voids. Skull and upper cervical spine: Normal marrow signal Sinuses/Orbits: Unremarkable MRA HEAD FINDINGS Right dominant vertebral artery. 60% stenosis at the proximal left V4 segment. The basilar is diffusely patent. Hypoplastic left P1 segment with fetal type PCA flow. No branch occlusion, beading, or aneurysm in the posterior circulation. Symmetric carotid artery size. No branch occlusion, beading, or proximal flow limiting stenosis. Mild narrowing at the anterior genu of the right cavernous ICA. Mild left M1 segment narrowing. Possible generalized atheromatous irregularity of MCA branches. MRA NECK FINDINGS Normal caliber of the aortic arch. Two vessel arch branching. The carotid and vertebral arteries are smoothly contoured and widely patent in the neck. Limited assessment of the left vertebral origin due to small vessel size and typical time-of-flight artifact. IMPRESSION: Brain MRI:  1. No acute finding. Areas of altered diffusion signal are related to remote infarcts scattered along the cerebral convexities. 2. Chronic small vessel ischemia including chronic pontine and left thalamic lacunes. Intracranial MRA: 1. Flow reducing stenosis at the non dominant left V4 segment. 2. Mild atheromatous changes in the anterior circulation. Neck MRA: Negative. Electronically Signed   By: Monte Fantasia M.D.   On: 09/09/2020 07:26   CT Renal Stone Study  Result Date:  09/09/2020 CLINICAL DATA:  Flank pain. EXAM: CT ABDOMEN AND PELVIS WITHOUT CONTRAST TECHNIQUE: Multidetector CT imaging of the abdomen and pelvis was performed following the standard protocol without IV contrast. COMPARISON:  August 24, 2020 FINDINGS: Lower chest: Sternal wires are present. Very mild linear atelectasis is noted within the bilateral lung bases. Hepatobiliary: No focal liver abnormality is seen. Multiple tiny gallstones are seen within the partially contracted gallbladder. Pancreas: Unremarkable. No pancreatic ductal dilatation or surrounding inflammatory changes. Spleen: Normal in size without focal abnormality. Adrenals/Urinary Tract: Adrenal glands are unremarkable. Kidneys are normal in size. Multiple bilateral parapelvic renal cysts are seen a 3 mm nonobstructing renal stone is seen within the posterior aspect of the mid left kidney. Bladder is unremarkable. Stomach/Bowel: Stomach is within normal limits. Appendix appears normal. No evidence of bowel wall thickening, distention, or inflammatory changes. Noninflamed diverticula are seen within the sigmoid colon. Vascular/Lymphatic: Aortic atherosclerosis. No enlarged abdominal or pelvic lymph nodes. Reproductive: The prostate gland is mildly enlarged. Other: There is a 2.5 cm x 2.7 cm fat containing umbilical hernia. No abdominopelvic ascites. Musculoskeletal: Marked severity degenerative changes seen at the level of L4-L5. IMPRESSION: 1. Cholelithiasis. 2. 3 mm nonobstructing renal stone within the left kidney. 3. Multiple bilateral parapelvic renal cysts. 4. Sigmoid diverticulosis. 5. Fat-containing umbilical hernia. 6. Marked severity degenerative changes at the level of L4-L5. 7. Aortic atherosclerosis. Aortic Atherosclerosis (ICD10-I70.0). Electronically Signed   By: Virgina Norfolk M.D.   On: 09/09/2020 02:12       LOS: 0 days   [facility] Pager on [email or web address]  09/09/2020, 8:51 AM

## 2020-09-09 NOTE — ED Notes (Signed)
Paged Admitting MD.

## 2020-09-09 NOTE — ED Notes (Signed)
Lunch Tray Ordered @ 1016. 

## 2020-09-10 ENCOUNTER — Inpatient Hospital Stay (HOSPITAL_COMMUNITY): Payer: Medicare HMO

## 2020-09-10 ENCOUNTER — Encounter (HOSPITAL_COMMUNITY): Payer: Medicare HMO

## 2020-09-10 DIAGNOSIS — G9341 Metabolic encephalopathy: Secondary | ICD-10-CM

## 2020-09-10 LAB — ECHOCARDIOGRAM COMPLETE
AR max vel: 2.35 cm2
AV Area VTI: 2.48 cm2
AV Area mean vel: 2.27 cm2
AV Mean grad: 5 mmHg
AV Peak grad: 8.1 mmHg
Ao pk vel: 1.42 m/s
Area-P 1/2: 3.48 cm2
Calc EF: 32.1 %
Height: 70 in
MV VTI: 2.55 cm2
P 1/2 time: 993 msec
S' Lateral: 3.2 cm
Single Plane A2C EF: 19.7 %
Single Plane A4C EF: 45.2 %
Weight: 2512 oz

## 2020-09-10 LAB — CBC
HCT: 28.9 % — ABNORMAL LOW (ref 39.0–52.0)
Hemoglobin: 8.8 g/dL — ABNORMAL LOW (ref 13.0–17.0)
MCH: 27.2 pg (ref 26.0–34.0)
MCHC: 30.4 g/dL (ref 30.0–36.0)
MCV: 89.2 fL (ref 80.0–100.0)
Platelets: 706 10*3/uL — ABNORMAL HIGH (ref 150–400)
RBC: 3.24 MIL/uL — ABNORMAL LOW (ref 4.22–5.81)
RDW: 17.4 % — ABNORMAL HIGH (ref 11.5–15.5)
WBC: 9.8 10*3/uL (ref 4.0–10.5)
nRBC: 0 % (ref 0.0–0.2)

## 2020-09-10 LAB — COMPREHENSIVE METABOLIC PANEL
ALT: 22 U/L (ref 0–44)
AST: 50 U/L — ABNORMAL HIGH (ref 15–41)
Albumin: 1.7 g/dL — ABNORMAL LOW (ref 3.5–5.0)
Alkaline Phosphatase: 47 U/L (ref 38–126)
Anion gap: 8 (ref 5–15)
BUN: 21 mg/dL (ref 8–23)
CO2: 23 mmol/L (ref 22–32)
Calcium: 7.7 mg/dL — ABNORMAL LOW (ref 8.9–10.3)
Chloride: 105 mmol/L (ref 98–111)
Creatinine, Ser: 1.41 mg/dL — ABNORMAL HIGH (ref 0.61–1.24)
GFR, Estimated: 52 mL/min — ABNORMAL LOW (ref >60)
Glucose, Bld: 147 mg/dL — ABNORMAL HIGH (ref 70–99)
Potassium: 4.6 mmol/L (ref 3.5–5.1)
Sodium: 136 mmol/L (ref 135–145)
Total Bilirubin: 0.4 mg/dL (ref 0.3–1.2)
Total Protein: 4 g/dL — ABNORMAL LOW (ref 6.5–8.1)

## 2020-09-10 LAB — CBG MONITORING, ED
Glucose-Capillary: 120 mg/dL — ABNORMAL HIGH (ref 70–99)
Glucose-Capillary: 212 mg/dL — ABNORMAL HIGH (ref 70–99)

## 2020-09-10 LAB — PROTIME-INR
INR: 2 — ABNORMAL HIGH (ref 0.8–1.2)
Prothrombin Time: 21.7 seconds — ABNORMAL HIGH (ref 11.4–15.2)

## 2020-09-10 LAB — GLUCOSE, CAPILLARY
Glucose-Capillary: 215 mg/dL — ABNORMAL HIGH (ref 70–99)
Glucose-Capillary: 177 mg/dL — ABNORMAL HIGH (ref 70–99)

## 2020-09-10 MED ORDER — WARFARIN SODIUM 3 MG PO TABS
3.0000 mg | ORAL_TABLET | Freq: Once | ORAL | Status: DC
  Filled 2020-09-10: qty 1

## 2020-09-10 MED ORDER — FERUMOXYTOL INJECTION 510 MG/17 ML
510.0000 mg | INTRAVENOUS | Status: DC
  Administered 2020-09-10: 510 mg via INTRAVENOUS
  Filled 2020-09-10: qty 17

## 2020-09-10 MED ORDER — SODIUM CHLORIDE 0.9 % IV SOLN: INTRAVENOUS | Status: DC | PRN

## 2020-09-10 MED ORDER — ORAL CARE MOUTH RINSE
15.0000 mL | Freq: Two times a day (BID) | OROMUCOSAL | Status: DC
  Administered 2020-09-11: 15 mL via OROMUCOSAL

## 2020-09-10 MED ORDER — THIAMINE HCL 100 MG PO TABS
100.0000 mg | ORAL_TABLET | Freq: Every day | ORAL | Status: DC
  Administered 2020-09-10 – 2020-09-11 (×2): 100 mg via ORAL
  Filled 2020-09-10 (×2): qty 1

## 2020-09-10 NOTE — Progress Notes (Signed)
EEG complete - results pending 

## 2020-09-10 NOTE — ED Notes (Signed)
Adult diaper wet , changed.

## 2020-09-10 NOTE — Progress Notes (Addendum)
STROKE TEAM PROGRESS NOTE   INTERVAL HISTORY No acute events since arrival.  Hemodynamically and neurologically stable. Afebrile.  Patient is more alert and brisk with responses this morning. He denies any new complaints or concerns. Specifically denies headache, new numbness, weakness, vision changes. No visitors at bedside.   Vitals:   09/10/20 0300 09/10/20 0330 09/10/20 0638 09/10/20 1043  BP: 112/65 116/62 (!) 100/54 101/65  Pulse: 78 82 79 96  Resp: 17 20 16 18   Temp:  98.5 F (36.9 C) 98.2 F (36.8 C)   TempSrc:  Oral Oral   SpO2: 93% 97% 95% 96%  Weight:      Height:       CBC:  Recent Labs  Lab 09/08/20 1816 09/10/20 0419  WBC 10.7* 9.8  HGB 8.5* 8.8*  HCT 27.9* 28.9*  MCV 89.4 89.2  PLT 783* 630*   Basic Metabolic Panel:  Recent Labs  Lab 09/08/20 1816 09/10/20 0419  NA 135 136  K 4.5 4.6  CL 104 105  CO2 21* 23  GLUCOSE 257* 147*  BUN 21 21  CREATININE 1.19 1.41*  CALCIUM 8.1* 7.7*   Lipid Panel:  Recent Labs  Lab 09/09/20 0402  CHOL 65  TRIG 94  HDL 21*  CHOLHDL 3.1  VLDL 19  LDLCALC 25   HgbA1c:  Recent Labs  Lab 09/09/20 0258  HGBA1C 6.8*   Urine Drug Screen: No results for input(s): LABOPIA, COCAINSCRNUR, LABBENZ, AMPHETMU, THCU, LABBARB in the last 168 hours.  Alcohol Level No results for input(s): ETH in the last 168 hours.  IMAGING past 24 hours  PHYSICAL EXAM  Physical Exam  Constitutional: Appears well-developed and well-nourished.  Psych: Calm Eyes: No scleral injection HENT: No OP obstruction MSK: no joint deformities.  Card: regular rhythm on tele Respiratory: No extra work of breathing  Skin: WDI  Neuro: Mental Status: Patient is alert, oriented to self, place and month.  He is able to follow a two step command.  Naming intact 3/3 objects. Recall of recent events impaired.  Cranial Nerves: II: Visual Fields are full. Pupils are equal, round, and reactive to light.   III,IV, VI: EOMI without ptosis or  diploplia.  V: Facial sensation is symmetric to temperature VII: ? Decreased R NL fold.  VIII: hearing is intact to voice X: Uvula elevates symmetrically XI: Shoulder shrug is symmetric. XII: tongue is midline without atrophy or fasciculations.  Motor: Tone is normal. Bulk is normal. 5/5 strength was present in all four extremities.  Sensory: Sensation is symmetric to light touch x 4 extremities  Cerebellar: RAMs intact BUE  ASSESSMENT/PLAN  Patrick Miranda is a 75 y.o. 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 Presented  with altered mental status, odd behavior present for a couple of weeks but worsening over the past 4 days.  His wife states that she noticed "sometimes" up to 2 weeks ago but over the past 4 days his has been behaving oddly.  She describes him responding to multiple situations in inappropriate ways such as looking for a place to put the chili into the refrigerator, but there was no chili.  He has also been more somnolent.  Of  note, 6 days ago he was started on prednisone for ongoing GI issues.  He was admitted in December for a cardiac bypass surgery.  He subsequently was found to have a left ventricular thrombus and started on full dose Lovenox on 1/19.  As part of an evaluation for a fall when he  presented on 2/4 with abdominal pain, a head CT was performed which showed an age-indeterminate infarct in the left frontal region.  When he presented this time with confusion, a repeat head CT was obtained which shows evolution of frontal infarct suggestive of a subacute nature.   Stroke: chronic left frontal and right  parietal infarcts, likely due to CABG in 06/2020 vs. PAF vs. LV thrombus, currently on coumadin with difficult INR control  Code Stroke HCT Evolving subacute infarct LEFT frontal lobe since prior study. Subacute to old posterior RIGHT parietal infarct little changed.   Brain MRI: Areas of altered diffusion signal are related to remote infarcts scattered along the cerebral convexities. Chronic small vessel ischemia including chronic pontine and left thalamic lacunes.  Intracranial MRA: Flow reducing stenosis at the non dominant left V4 segment.   2D Echo LVEF is 40%. Akinesis of the inferior/inferoseptal base with aneurysmal  dilitation. There is also akinesis of the apex. Compared to echo from  January 2022, the apical thrombus is no longer present.  EEG mild diffuse encephalopathy, no seizure  LDL 25  HgbA1c 6.8  VTE prophylaxis: on Coumadin  On warfarin with apparent difficulty regulating per chart review. Recommend changing Coumadin to Eliquis for reliable anticoagulation. Primary team to discuss with cardiology.   Therapy recommendations:  Pending   Disposition:  TBD  CAD s/p CABG 06/2020 LV thrombus 07/2020  1/17 TTE EF of 45% with basal and inferior septal aneurysm and probable LV thrombus  This admission TTE LVEF is 40%. Akinesis of the inferior/inferoseptal base with aneurysmal dilitation. There is also akinesis of the apex. Compared to echo from January 2022, the apical thrombus is no longer present.  Likely the cause of current 2 subacute to chronic infarcts on CT and MRI  Currently on coumadin  INR this admission 2.5->2.0  Historical difficulty INR control   Recommend changing Coumadin to Eliquis for reliable anticoagulation. Primary team to discuss with cardiology.  COVID 11/2019 PE  Was on eliquis for 6 months   Stopped in 06/2020  PAF  In the setting of COVID and post CABG  Currently NSR  Now on coumadin with difficult INR  May consider switch to  eliquis  Cognitive impairment  Vascular dementia  Examples of odd behavior reviewed (wife provided event examples on her records)  Consistent with cognitive impairment   Likely vascular dementia worsened after strokes.  Recommend outpt neurology follow up for neuropsych testing.  Chronic abdominal pain and diarrhea incidental cholelithiasis  20 lb weight loss since December.  Plan to pursue Upper and Lower endoscopy as outpt after cardiology clearance.   Also concerning for GI infarction due to LV thrombus  Management by primary team  Hypertension  Bp overall soft since admission with systolic mostly in 048G ranging from 104-133/54-89 . Avoid low BP . Long-term BP goal normotensive  Hyperlipidemia  Home meds: Crestor 20mg   LDL 25 at goal < 70  Now on home crestor 20  Continue statin at discharge  Diabetes type II Uncontrolled  HgbA1c 6.8, goal < 7.0   on Insulin   CBGs  SSI  PCP follow up  Other Stroke Risk Factors  Advanced Age  Hx stroke/TIA - pontine infarct in 2013 - no residue  Obstructive sleep apnea  Other Active Problems  Chronic anemia, Hb 8.5  Hospital day # 1  Delila A Bailey-Modzik, NP-C   ATTENDING NOTE: I reviewed above note and agree with the assessment and plan. Pt was seen and examined.   No acute event overnight, wife at bedside. Pt was undergoing EEG testing. Later EEG report showed no Sz. TTE also showed EF 40% no LV thrombus anymore but still has akinesis of the inferior/inferoseptal base with aneurysmal dilitation and akinesis of the apex.   Pt presentation consistent with vascular dementia likely exacerbated by recent b/l strokes. Recommend coumadin switch to eliquis for stroke prevention given difficult INR control. Also recommend to continue statin and outpt neurology follow up for neuropsych testing and dementia follow up.  Neurology will sign off. Please call with questions. Pt will follow up with stroke clinic  Dr. Leonie Man at [facility] in about 4 weeks. Thanks for the consult.   Rosalin Hawking, MD PhD Stroke Neurology 09/10/2020 3:06 PM       To contact Stroke Continuity provider, please refer to [email or web address] After hours, contact General Neurology

## 2020-09-10 NOTE — ED Notes (Addendum)
Patient incont of stool, when ask if he needed to use restroom didn't realized he had gone, cleaned patient and new linens applied. Yellow socks and fall band applied.

## 2020-09-10 NOTE — ED Notes (Signed)
Tele Breakfast Orders Placed

## 2020-09-10 NOTE — Progress Notes (Signed)
[place] for Warfarin Indication: LV thrombus  Allergies  Allergen Reactions  . Lipitor [Atorvastatin] Other (See Comments)    Muscle pain  . Sitagliptin     Other reaction(s): myalgias    Patient Measurements: Height: 5\' 10"  (177.8 cm) Weight: 71.2 kg (157 lb) IBW/kg (Calculated) : 73  Vital Signs: Temp: 98.2 F (36.8 C) (02/21 0638) Temp Source: Oral (02/21 9150) BP: 100/54 (02/21 5697) Pulse Rate: 79 (02/21 0638)  Labs: Recent Labs    09/08/20 1816 09/09/20 0127 09/10/20 0419  HGB 8.5*  --  8.8*  HCT 27.9*  --  28.9*  PLT 783*  --  706*  LABPROT  --  26.3* 21.7*  INR  --  2.5* 2.0*  CREATININE 1.19  --  1.41*    Estimated Creatinine Clearance: 45.6 mL/min (A) (by C-G formula based on SCr of 1.41 mg/dL (H)).   Medical History: Past Medical History:  Diagnosis Date  . Arthritis   . 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 Cancer (HCC)    Skin  . Cerebral artery disease   . Coronary artery disease   . COVID-07 Dec 2019  . COVID-19 virus infection 12/02/2019  . Depression   . Essential hypertension   . Hernia, umbilical   . HOH (hard of hearing)   . Hyperlipidemia   . NSTEMI (non-ST elevated myocardial infarction) ([place])   . OSA on CPAP   . Pulmonary emboli [facility])    May 2021  . Type 2 diabetes mellitus (HCC)     Assessment: 75 y.o. M presents with AMS, abd pain. Pt on warfarin PTA for LV thrombus, hx afib. Admission INR 2.5 - therapeutic. INR down to 2.0 today (likely due to missed dose on 2/19). CBC stable. No bleeding issues reported.  Home dose: 2mg  daily - last dose 2/18  Goal of Therapy:  INR 2-3 Monitor platelets by anticoagulation protocol: Yes   Plan:  Warfarin 3mg  PO x 1 dose today; then likely back to home dose tomorrow Monitor daily INR, CBC, s/sx bleeding   Arturo Morton, PharmD, BCPS Please check AMION for all [place] contact numbers Clinical Pharmacist 09/10/2020 9:40 AM

## 2020-09-10 NOTE — ED Notes (Signed)
Family at bedside. 

## 2020-09-10 NOTE — Progress Notes (Signed)
[facility] PROGRESS NOTE   Aaronmichael Brumbaugh Harpole GYK:[id number] DOB: Oct 13, 1945 DOA: 09/08/2020  PCP: Burnard Bunting, MD  Brief History/Interval Summary: 75 y.o. male with hx of CAD, insulin-dependent type 2 diabetes, hypertension, dyslipidemia, urinary retention, AV thrombus, paroxysmal A. fib, cardiomyopathy who presented to the hospital on 09/08/2020 secondary to worsening encephalopathy. Patient was started on prednisone by his gastroenterologist for ongoing GI issues about 6 days prior to admission. CT scan of the head raised concern for acute stroke. Patient was seen by neurology. Hospitalized for further management.  Reason for Visit: Acute metabolic encephalopathy  Consultants: Neurology  Procedures: EEG and echocardiogram has been ordered  Antibiotics: Anti-infectives (From admission, onward)   None      Subjective/Interval History: Patient wife is at the bedside.  Patient remains mildly distracted slightly better compared to yesterday.  He denies any new issues.  Could not sleep last night due to noise.  No chest pain or shortness of breath     Assessment/Plan:  Acute metabolic encephalopathy Thought to be multifactorial including stroke and medication induced. However MRI brain did not show any acute stroke. Remote infarcts were noted. Patient was started on prednisone about 6 days ago. Prednisone seems to be the most likely culprit.  Prednisone has been discontinued. EEG is pending.  Seen by neurology.  Currently on aspirin and statin.  Ammonia level was normal.  Infectious work-up was unremarkable.  Patient to be seen by speech therapy.    History of stroke Patient noted to have remote strokes on imaging studies.  Discussed with neurology who evaluated patient.  They think that patient has had strokes around the time of his CABG surgery.  LDL is noted to be 25.  HbA1c 6.8.  MRI did not show any acute stroke.  PT and OT evaluation.  Speech therapy to see.   Echocardiogram is pending.  Neurology does recommend changing from warfarin to Eliquis which may make sense since patient has had difficulty getting his INR regulated.  Will discuss this with cardiology after echocardiogram report is available.  History of for left ventricular thrombus Patient on warfarin. INR was 2.5 earlier this morning. Coumadin per pharmacy.  May consider changing to Eliquis depending on echocardiogram and discussion with cardiology.  Coronary artery disease status post CABG He recently underwent CABG in December. He is followed by Dr. Domenic Polite who is his cardiologist. Last echocardiogram on 1/17 showed a EF of 45% with basal and inferior septal aneurysm and probable LV thrombus.  Seems to be stable from cardiac standpoint.  Continue beta-blocker aspirin and statin.  Echocardiogram to be repeated.    Chronic abdominal pain and diarrhea/incidental cholelithiasis Followed at [facility] gastroenterology, Dr. Earlean Shawl. Started on prednisone recently for GI issues. Apparently he has soft stools but very frequent.  CT scan renal study did not show any acute intra-abdominal process. Cholelithiasis was noted incidentally.  Abdomen is benign.  LFTs were unremarkable.  According to the wife the plan is to pursue upper endoscopy and Colonoscopy in the near future once cleared by his cardiologist.  They may consider seeing a gastroenterologist in the [place] area.  Incidental left renal stone Outpatient monitoring. Currently asymptomatic.  History of paroxysmal atrial fibrillation Metoprolol being continued. He is anticoagulated with warfarin.  May switch him over to Eliquis.  See discussion above.  Diabetes mellitus type 2, uncontrolled with hyperglycemia Noted to be on Lantus and SSI. Monitor CBGs. HbA1c 6.8. Poorly controlled CBGs most likely due to steroids. Since steroids have been  discontinued likely get better.  Previous history of urinary retention Continue Flomax and  finasteride. Has been able to urinate here in the hospital.  Normocytic anemia Seems to be stable. No evidence of overt blood loss. Supposed to get an iron infusion in the near future.  Chronic kidney disease stage 3a Renal function noted to be close to baseline.  Monitor urine output.   DVT Prophylaxis: On warfarin currently Code Status: Full code Family Communication: Discussed with the patient and his wife Disposition Plan: Hopefully return home when improved and when work-up has been completed.  Status is: Inpatient  Remains inpatient appropriate because:Altered mental status, Ongoing diagnostic testing needed not appropriate for outpatient work up and Inpatient level of care appropriate due to severity of illness   Dispo: The patient is from: Home              Anticipated d/c is to: Home              Anticipated d/c date is: 2 days              Patient currently is not medically stable to d/c.   Difficult to place patient No      Medications:  Scheduled: . aspirin  325 mg Oral Daily  . fenofibrate  160 mg Oral Daily  . finasteride  5 mg Oral Daily  . hyoscyamine  0.375 mg Oral QHS  . insulin aspart  0-15 Units Subcutaneous TID WC  . insulin aspart  0-5 Units Subcutaneous QHS  . insulin glargine  20 Units Subcutaneous Daily  . metoprolol tartrate  25 mg Oral BID  . rosuvastatin  20 mg Oral Daily  . tamsulosin  0.4 mg Oral QHS  . thiamine  100 mg Oral Daily  . warfarin  3 mg Oral ONCE-1600  . Warfarin - Pharmacist Dosing Inpatient   Does not apply q1600   Continuous:  [id number] **OR** acetaminophen (TYLENOL) oral liquid 160 mg/5 mL **OR** acetaminophen, loperamide   Objective:  Vital Signs  Vitals:   09/10/20 0300 09/10/20 0330 09/10/20 0638 09/10/20 1043  BP: 112/65 116/62 (!) 100/54 101/65  Pulse: 78 82 79 96  Resp: 17 20 16 18   Temp:  98.5 F (36.9 C) 98.2 F (36.8 C)   TempSrc:  Oral Oral   SpO2: 93% 97% 95% 96%  Weight:      Height:         Intake/Output Summary (Last 24 hours) at 09/10/2020 1057 Last data filed at 09/09/2020 1618 Gross per 24 hour  Intake -  Output 600 ml  Net -600 ml   Filed Weights   09/09/20 0719  Weight: 71.2 kg    General appearance: Awake alert.  In no distress.  Mildly distracted Resp: Clear to auscultation bilaterally.  Normal effort Cardio: S1-S2 is normal regular.  No S3-S4.  No rubs murmurs or bruit GI: Abdomen is soft.  Nontender nondistended.  Bowel sounds are present normal.  No masses organomegaly Extremities: Mild edema bilateral lower extremities. Neurologic:   No focal neurological deficits.    Lab Results:  Data Reviewed: I have personally reviewed following labs and imaging studies  CBC: Recent Labs  Lab 09/08/20 1816 09/10/20 0419  WBC 10.7* 9.8  HGB 8.5* 8.8*  HCT 27.9* 28.9*  MCV 89.4 89.2  PLT 783* 706*    Basic Metabolic Panel: Recent Labs  Lab 09/08/20 1816 09/10/20 0419  NA 135 136  K 4.5 4.6  CL 104 105  CO2 21*  23  GLUCOSE 257* 147*  BUN 21 21  CREATININE 1.19 1.41*  CALCIUM 8.1* 7.7*    GFR: Estimated Creatinine Clearance: 45.6 mL/min (A) (by C-G formula based on SCr of 1.41 mg/dL (H)).  Liver Function Tests: Recent Labs  Lab 09/08/20 1816 09/10/20 0419  AST 44* 50*  ALT 19 22  ALKPHOS 52 47  BILITOT 0.4 0.4  PROT 4.7* 4.0*  ALBUMIN 1.9* 1.7*    Recent Labs  Lab 09/08/20 1816  LIPASE 59*   Recent Labs  Lab 09/09/20 0402  AMMONIA 22    Coagulation Profile: Recent Labs  Lab 09/09/20 0127 09/10/20 0419  INR 2.5* 2.0*     HbA1C: Recent Labs    09/09/20 0258  HGBA1C 6.8*    CBG: Recent Labs  Lab 09/09/20 0718 09/09/20 1155 09/09/20 1615 09/09/20 2258 09/10/20 0804  GLUCAP 83 120* 162* 174* 120*    Lipid Profile: Recent Labs    09/09/20 0402  CHOL 65  HDL 21*  LDLCALC 25  TRIG 94  CHOLHDL 3.1     Recent Results (from the past 240 hour(s))  Resp Panel by RT-PCR (Flu A&B, Covid) Nasopharyngeal  Swab     Status: None   Collection Time: 09/09/20  1:27 AM   Specimen: Nasopharyngeal Swab; Nasopharyngeal(NP) swabs in vial transport medium  Result Value Ref Range Status   SARS Coronavirus 2 by RT PCR NEGATIVE NEGATIVE Final    Comment: (NOTE) SARS-CoV-2 target nucleic acids are NOT DETECTED.  The SARS-CoV-2 RNA is generally detectable in upper respiratory specimens during the acute phase of infection. The lowest concentration of SARS-CoV-2 viral copies this assay can detect is 138 copies/mL. A negative result does not preclude SARS-Cov-2 infection and should not be used as the sole basis for treatment or other patient management decisions. A negative result may occur with  improper specimen collection/handling, submission of specimen other than nasopharyngeal swab, presence of viral mutation(s) within the areas targeted by this assay, and inadequate number of viral copies(<138 copies/mL). A negative result must be combined with clinical observations, patient history, and epidemiological information. The expected result is Negative.  Fact Sheet for Patients:  EntrepreneurPulse.com.au  Fact Sheet for Healthcare Providers:  IncredibleEmployment.be  This test is no t yet approved or cleared by the [place] FDA and  has been authorized for detection and/or diagnosis of SARS-CoV-2 by FDA under an Emergency Use Authorization (EUA). This EUA will remain  in effect (meaning this test can be used) for the duration of the COVID-19 declaration under Section 564(b)(1) of the Act, 21 U.S.C.section 360bbb-3(b)(1), unless the authorization is terminated  or revoked sooner.       Influenza A by PCR NEGATIVE NEGATIVE Final   Influenza B by PCR NEGATIVE NEGATIVE Final    Comment: (NOTE) The Xpert Xpress SARS-CoV-2/FLU/RSV plus assay is intended as an aid in the diagnosis of influenza from Nasopharyngeal swab specimens and should not be used as a sole  basis for treatment. Nasal washings and aspirates are unacceptable for Xpert Xpress SARS-CoV-2/FLU/RSV testing.  Fact Sheet for Patients: EntrepreneurPulse.com.au  Fact Sheet for Healthcare Providers: IncredibleEmployment.be  This test is not yet approved or cleared by the [place] FDA and has been authorized for detection and/or diagnosis of SARS-CoV-2 by FDA under an Emergency Use Authorization (EUA). This EUA will remain in effect (meaning this test can be used) for the duration of the COVID-19 declaration under Section 564(b)(1) of the Act, 21 U.S.C. section 360bbb-3(b)(1), unless the authorization is  terminated or revoked.  Performed at [facility], [place] [address] [place], [place] [address]       Radiology Studies: CT Head Wo Contrast  Result Date: 09/08/2020 CLINICAL DATA:  Mental status changes of unknown cause, confusion. History coronary artery disease post MI, hypertension, type II diabetes mellitus, history COVID-19 in May 2021 EXAM: CT HEAD WITHOUT CONTRAST TECHNIQUE: Contiguous axial images were obtained from the base of the skull through the vertex without intravenous contrast. Sagittal and coronal MPR images reconstructed from axial data set. COMPARISON:  08/24/2020 FINDINGS: Brain: Generalized atrophy. Normal ventricular morphology. No midline shift or mass effect. Evolving subacute infarct LEFT frontal lobe, better defined than on previous exam. Subacute to old posterior RIGHT parietal infarct little changed. Small vessel chronic ischemic changes of deep cerebral white matter. No intracranial hemorrhage, mass lesion, or additional infarct identified. No extra-axial fluid collections. Vascular: No hyperdense vessels. Atherosclerotic calcifications of internal carotid arteries bilaterally at skull base Skull: Intact Sinuses/Orbits: Clear Other: N/A IMPRESSION: Evolving subacute infarct LEFT frontal lobe since prior study.  Subacute to old posterior RIGHT parietal infarct little changed. Atrophy with small vessel chronic ischemic changes of deep cerebral white matter. No new intracranial abnormalities. Electronically Signed   By: Lavonia Dana M.D.   On: 09/08/2020 19:19   MR ANGIO HEAD WO CONTRAST  Result Date: 09/09/2020 CLINICAL DATA:  Altered mental status for a few weeks EXAM: MRI HEAD WITHOUT CONTRAST MRA HEAD WITHOUT CONTRAST MRA NECK WITHOUT CONTRAST TECHNIQUE: Multiplanar, multiecho pulse sequences of the brain and surrounding structures were obtained without intravenous contrast. Angiographic images of the Circle of Willis were obtained using MRA technique without intravenous contrast. Angiographic images of the neck were obtained using MRA technique without intravenous contrast. Carotid stenosis measurements (when applicable) are obtained utilizing NASCET criteria, using the distal internal carotid diameter as the denominator. COMPARISON:  None. FINDINGS: MRI HEAD FINDINGS Brain: Patchy areas of increased T1 signal in the right temporal occipital cortex but chronic appearing on the other sequences demonstrating encephalomalacia and cortical laminar necrosis. Increased diffusion signal around the periphery of a clearly chronic anterior left frontal infarct which shows cortical laminar necrosis and dense encephalomalacia. No acute hemorrhage, hydrocephalus, or collection. Mild for age cerebral volume loss. Chronic small vessel ischemia with ischemic gliosis in the white matter and pons. Chronic lacune is at the right pons and left thalamus. Accentuated dilated perivascular spaces. Vascular: Preserved flow voids. Skull and upper cervical spine: Normal marrow signal Sinuses/Orbits: Unremarkable MRA HEAD FINDINGS Right dominant vertebral artery. 60% stenosis at the proximal left V4 segment. The basilar is diffusely patent. Hypoplastic left P1 segment with fetal type PCA flow. No branch occlusion, beading, or aneurysm in the  posterior circulation. Symmetric carotid artery size. No branch occlusion, beading, or proximal flow limiting stenosis. Mild narrowing at the anterior genu of the right cavernous ICA. Mild left M1 segment narrowing. Possible generalized atheromatous irregularity of MCA branches. MRA NECK FINDINGS Normal caliber of the aortic arch. Two vessel arch branching. The carotid and vertebral arteries are smoothly contoured and widely patent in the neck. Limited assessment of the left vertebral origin due to small vessel size and typical time-of-flight artifact. IMPRESSION: Brain MRI: 1. No acute finding. Areas of altered diffusion signal are related to remote infarcts scattered along the cerebral convexities. 2. Chronic small vessel ischemia including chronic pontine and left thalamic lacunes. Intracranial MRA: 1. Flow reducing stenosis at the non dominant left V4 segment. 2. Mild atheromatous changes in the anterior  circulation. Neck MRA: Negative. Electronically Signed   By: Monte Fantasia M.D.   On: 09/09/2020 07:26   MR ANGIO NECK WO CONTRAST  Result Date: 09/09/2020 CLINICAL DATA:  Altered mental status for a few weeks EXAM: MRI HEAD WITHOUT CONTRAST MRA HEAD WITHOUT CONTRAST MRA NECK WITHOUT CONTRAST TECHNIQUE: Multiplanar, multiecho pulse sequences of the brain and surrounding structures were obtained without intravenous contrast. Angiographic images of the Circle of Willis were obtained using MRA technique without intravenous contrast. Angiographic images of the neck were obtained using MRA technique without intravenous contrast. Carotid stenosis measurements (when applicable) are obtained utilizing NASCET criteria, using the distal internal carotid diameter as the denominator. COMPARISON:  None. FINDINGS: MRI HEAD FINDINGS Brain: Patchy areas of increased T1 signal in the right temporal occipital cortex but chronic appearing on the other sequences demonstrating encephalomalacia and cortical laminar necrosis.  Increased diffusion signal around the periphery of a clearly chronic anterior left frontal infarct which shows cortical laminar necrosis and dense encephalomalacia. No acute hemorrhage, hydrocephalus, or collection. Mild for age cerebral volume loss. Chronic small vessel ischemia with ischemic gliosis in the white matter and pons. Chronic lacune is at the right pons and left thalamus. Accentuated dilated perivascular spaces. Vascular: Preserved flow voids. Skull and upper cervical spine: Normal marrow signal Sinuses/Orbits: Unremarkable MRA HEAD FINDINGS Right dominant vertebral artery. 60% stenosis at the proximal left V4 segment. The basilar is diffusely patent. Hypoplastic left P1 segment with fetal type PCA flow. No branch occlusion, beading, or aneurysm in the posterior circulation. Symmetric carotid artery size. No branch occlusion, beading, or proximal flow limiting stenosis. Mild narrowing at the anterior genu of the right cavernous ICA. Mild left M1 segment narrowing. Possible generalized atheromatous irregularity of MCA branches. MRA NECK FINDINGS Normal caliber of the aortic arch. Two vessel arch branching. The carotid and vertebral arteries are smoothly contoured and widely patent in the neck. Limited assessment of the left vertebral origin due to small vessel size and typical time-of-flight artifact. IMPRESSION: Brain MRI: 1. No acute finding. Areas of altered diffusion signal are related to remote infarcts scattered along the cerebral convexities. 2. Chronic small vessel ischemia including chronic pontine and left thalamic lacunes. Intracranial MRA: 1. Flow reducing stenosis at the non dominant left V4 segment. 2. Mild atheromatous changes in the anterior circulation. Neck MRA: Negative. Electronically Signed   By: Monte Fantasia M.D.   On: 09/09/2020 07:26   MR BRAIN WO CONTRAST  Result Date: 09/09/2020 CLINICAL DATA:  Altered mental status for a few weeks EXAM: MRI HEAD WITHOUT CONTRAST MRA HEAD  WITHOUT CONTRAST MRA NECK WITHOUT CONTRAST TECHNIQUE: Multiplanar, multiecho pulse sequences of the brain and surrounding structures were obtained without intravenous contrast. Angiographic images of the Circle of Willis were obtained using MRA technique without intravenous contrast. Angiographic images of the neck were obtained using MRA technique without intravenous contrast. Carotid stenosis measurements (when applicable) are obtained utilizing NASCET criteria, using the distal internal carotid diameter as the denominator. COMPARISON:  None. FINDINGS: MRI HEAD FINDINGS Brain: Patchy areas of increased T1 signal in the right temporal occipital cortex but chronic appearing on the other sequences demonstrating encephalomalacia and cortical laminar necrosis. Increased diffusion signal around the periphery of a clearly chronic anterior left frontal infarct which shows cortical laminar necrosis and dense encephalomalacia. No acute hemorrhage, hydrocephalus, or collection. Mild for age cerebral volume loss. Chronic small vessel ischemia with ischemic gliosis in the white matter and pons. Chronic lacune is at the right pons and left  thalamus. Accentuated dilated perivascular spaces. Vascular: Preserved flow voids. Skull and upper cervical spine: Normal marrow signal Sinuses/Orbits: Unremarkable MRA HEAD FINDINGS Right dominant vertebral artery. 60% stenosis at the proximal left V4 segment. The basilar is diffusely patent. Hypoplastic left P1 segment with fetal type PCA flow. No branch occlusion, beading, or aneurysm in the posterior circulation. Symmetric carotid artery size. No branch occlusion, beading, or proximal flow limiting stenosis. Mild narrowing at the anterior genu of the right cavernous ICA. Mild left M1 segment narrowing. Possible generalized atheromatous irregularity of MCA branches. MRA NECK FINDINGS Normal caliber of the aortic arch. Two vessel arch branching. The carotid and vertebral arteries are  smoothly contoured and widely patent in the neck. Limited assessment of the left vertebral origin due to small vessel size and typical time-of-flight artifact. IMPRESSION: Brain MRI: 1. No acute finding. Areas of altered diffusion signal are related to remote infarcts scattered along the cerebral convexities. 2. Chronic small vessel ischemia including chronic pontine and left thalamic lacunes. Intracranial MRA: 1. Flow reducing stenosis at the non dominant left V4 segment. 2. Mild atheromatous changes in the anterior circulation. Neck MRA: Negative. Electronically Signed   By: Monte Fantasia M.D.   On: 09/09/2020 07:26   CT Renal Stone Study  Result Date: 09/09/2020 CLINICAL DATA:  Flank pain. EXAM: CT ABDOMEN AND PELVIS WITHOUT CONTRAST TECHNIQUE: Multidetector CT imaging of the abdomen and pelvis was performed following the standard protocol without IV contrast. COMPARISON:  August 24, 2020 FINDINGS: Lower chest: Sternal wires are present. Very mild linear atelectasis is noted within the bilateral lung bases. Hepatobiliary: No focal liver abnormality is seen. Multiple tiny gallstones are seen within the partially contracted gallbladder. Pancreas: Unremarkable. No pancreatic ductal dilatation or surrounding inflammatory changes. Spleen: Normal in size without focal abnormality. Adrenals/Urinary Tract: Adrenal glands are unremarkable. Kidneys are normal in size. Multiple bilateral parapelvic renal cysts are seen a 3 mm nonobstructing renal stone is seen within the posterior aspect of the mid left kidney. Bladder is unremarkable. Stomach/Bowel: Stomach is within normal limits. Appendix appears normal. No evidence of bowel wall thickening, distention, or inflammatory changes. Noninflamed diverticula are seen within the sigmoid colon. Vascular/Lymphatic: Aortic atherosclerosis. No enlarged abdominal or pelvic lymph nodes. Reproductive: The prostate gland is mildly enlarged. Other: There is a 2.5 cm x 2.7 cm fat  containing umbilical hernia. No abdominopelvic ascites. Musculoskeletal: Marked severity degenerative changes seen at the level of L4-L5. IMPRESSION: 1. Cholelithiasis. 2. 3 mm nonobstructing renal stone within the left kidney. 3. Multiple bilateral parapelvic renal cysts. 4. Sigmoid diverticulosis. 5. Fat-containing umbilical hernia. 6. Marked severity degenerative changes at the level of L4-L5. 7. Aortic atherosclerosis. Aortic Atherosclerosis (ICD10-I70.0). Electronically Signed   By: Virgina Norfolk M.D.   On: 09/09/2020 02:12       LOS: 1 day   [facility] Pager on [email or web address]  09/10/2020, 10:57 AM

## 2020-09-10 NOTE — Progress Notes (Addendum)
Cardiology Office Note    Date:  09/11/2020   ID:  Patrick Miranda, DOB [birth year], MRN [id number]   PCP:  Burnard Bunting, [place]  Cardiologist:  Rozann Lesches, MD   Advanced Practice Provider:  No care team member to display Electrophysiologist:  None   :[id number]}   Chief Complaint  Patient presents with  . Hospitalization Follow-up    History of Present Illness:  [place] is a 75 y.o. male     with a history of CAD s/p CABG x3 in 06/2020, chronic heart failure with mildly reduced EF, paroxysmal atrial fibrillation in the setting of COVID and post CABG, PE related to COVID infection in 11/2019, hypertension, hyperlipidemia, type 2 diabetes, obstructive sleep apnea on CPAP, and depression    Patient was seen in the hospital 08/06/2020 and incidentally found to have an LV thrombus on echo LVEF 45% with large basal inferior and inferoseptal aneurysm and probable LV apical thrombus measuring 1.5 x 1.5 cm.  Likely secondary to aneurysm and remodeling.  He was treated with IV heparin and transition to Coumadin.  Patient echo initially was done because of abdominal pain and high fevers. Possible gastroenteritis versus mesenteric ischemia from LV thrombus.  I saw the patient 08/14/2020 complaining of abdominal discomfort and diarrhea not being able to button his pants.  I referred to GI.  INR 08/16/2020 was 8 and Coumadin held.  Dr. Domenic Polite called in some as needed Lasix 09/03/2020 for weight gain or edema.  Patient  Admitted to the hospital 09/09/20 for altered mental status and odd behavior for a couple weeks.  He had been in the ER after a fall on 08/24/2020 and head CT showed age-indeterminate infarct in the left frontal region.  Repeat CT yesterday showed evolution of frontal infarct suggestive of a subacute nature.  Neurology felt he had chronic left frontal and right parietal infarcts likely due to CABG in 06/2020 versus PAF with LV thrombus  currently on Coumadin with difficult INR control.  MRI showed areas of altered diffusion signal related to remote infarcts, chronic small vessel ischemia.  2D echo LVEF 40% akinesis of the inferior and superior septal base with aneurysmal dilatation, apical thrombus no longer present EEG no seizure.  Neurology recommended changing Coumadin to Eliquis because of unreliable anticoagulation with coumadin.  Patient discharged from the hospital today. He is here with wife and daughter. He is very lethargic. Still having abdominal pain and getting iron. Gi wants to do endo and colonoscopy and wife says needs clearance.  He's very lethargic main complaint is abdominal pain.  Past Medical History:  Diagnosis Date  . Arthritis   . 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 Cancer (HCC)    Skin  . Cerebral artery disease   . Coronary artery disease   . COVID-07 Dec 2019  . COVID-19 virus infection 12/02/2019  . Depression   . Essential hypertension   . Hernia, umbilical   . HOH (hard of hearing)   . Hyperlipidemia   . NSTEMI (non-ST elevated myocardial infarction) ([place])   . OSA on CPAP   . Pulmonary emboli [facility])    May 2021  . Type 2 diabetes mellitus ([place])     Past Surgical History:  Procedure Laterality Date  . COLONOSCOPY W/ POLYPECTOMY    . CORONARY ARTERY BYPASS GRAFT N/A 07/09/2020   Procedure: CORONARY ARTERY BYPASS GRAFTING (CABG) TIMES THREE , USING LEFT INTERNAL MAMMARY ARTERY  AND RIGHT LEG GREATER SAPHENOUS VEIN HARVESTED ENDOSCOPICALLY;  Surgeon: Ivin Poot, MD;  Location: [place];  Service: Open Heart Surgery;  Laterality: N/A;  . LEFT HEART CATH AND CORONARY ANGIOGRAPHY N/A 07/04/2020   Procedure: LEFT HEART CATH AND CORONARY ANGIOGRAPHY;  Surgeon: Troy Sine, MD;  Location: [place] [facility];  Service: Cardiovascular;  Laterality: N/A;  . TEE WITHOUT CARDIOVERSION N/A 07/09/2020   Procedure: TRANSESOPHAGEAL ECHOCARDIOGRAM (TEE);  Surgeon: Prescott Gum, Collier Salina, MD;   Location: [place];  Service: Open Heart Surgery;  Laterality: N/A;  . TOTAL KNEE ARTHROPLASTY Left 04/22/2013   Procedure: TOTAL KNEE ARTHROPLASTY- left;  Surgeon: Augustin Schooling, MD;  Location: [place];  Service: Orthopedics;  Laterality: Left;  with femoral block  . TOTAL KNEE ARTHROPLASTY Right 01/06/2014   Procedure: RIGHT TOTAL KNEE ARTHROPLASTY;  Surgeon: Augustin Schooling, MD;  Location: [place];  Service: Orthopedics;  Laterality: Right;    Current Medications: Current Meds  Medication Sig  . acetaminophen (TYLENOL) 325 MG tablet Take 2 tablets (650 mg total) by mouth every 6 (six) hours as needed for mild pain or headache (or Fever >/= 101).  Marland Kitchen apixaban (ELIQUIS) 5 MG TABS tablet Take 1 tablet (5 mg total) by mouth 2 (two) times daily.  Derrill Memo ON 09/12/2020] aspirin EC 81 MG EC tablet Take 1 tablet (81 mg total) by mouth daily. Swallow whole.  . BD PEN NEEDLE NANO U/F 32G X 4 MM MISC 1 each by Other route daily.   . Empagliflozin-metFORMIN HCl 11-998 MG TABS Take 1 tablet by mouth 2 (two) times daily.  . fenofibrate 160 MG tablet Take 160 mg by mouth daily.  . finasteride (PROSCAR) 5 MG tablet Take 5 mg by mouth daily.  . furosemide (LASIX) 40 MG tablet TAKE 1 TABLET DAILY AS NEEDED FOR WEIGHT GAIN OF 2-3LBS IN 24 HOURS (Patient taking differently: Take 40 mg by mouth See admin instructions. TAKE 1 TABLET DAILY AS NEEDED FOR WEIGHT GAIN OF 2-3LBS IN 24 HOURS)  . HUMALOG KWIKPEN 100 UNIT/ML KwikPen Inject 5-8 Units into the skin with breakfast, with lunch, and with evening meal.  . hyoscyamine (ANASPAZ) 0.125 MG TBDP disintergrating tablet Place 0.125 mg under the tongue every 6 (six) hours as needed for cramping.  . hyoscyamine (LEVBID) 0.375 MG 12 hr tablet Take 0.375 mg by mouth at bedtime.  Marland Kitchen loperamide (IMODIUM) 2 MG capsule Take 1 capsule (2 mg total) by mouth 4 (four) times daily as needed for up to 5 doses for diarrhea or loose stools.  . metoprolol tartrate (LOPRESSOR) 25 MG tablet  Take 0.5 tablets (12.5 mg total) by mouth 2 (two) times daily.  Glory Rosebush VERIO test strip 1 each by Other route in the morning, at noon, and at bedtime.   . rosuvastatin (CRESTOR) 20 MG tablet Take 1 tablet (20 mg total) by mouth daily.  . tamsulosin (FLOMAX) 0.4 MG CAPS capsule Take 1 capsule (0.4 mg total) by mouth daily. (Patient taking differently: Take 0.4 mg by mouth at bedtime.)  . [START ON 09/12/2020] thiamine 100 MG tablet Take 1 tablet (100 mg total) by mouth daily.  [name] 300 UNIT/ML Solostar Pen Inject 20 Units into the skin daily.  . traMADol (ULTRAM) 50 MG tablet Take 1 tablet (50 mg total) by mouth every 6 (six) hours as needed for moderate pain.     Allergies:   Lipitor [atorvastatin] and Sitagliptin   Social History   Socioeconomic History  .  Marital status: Married    Spouse name: Not on file  . Number of children: Not on file  . Years of education: Not on file  . Highest education level: Not on file  Occupational History  . Not on file  Tobacco Use  . Smoking status: Never Smoker  . Smokeless tobacco: Never Used  Vaping Use  . Vaping Use: Never used  Substance and Sexual Activity  . Alcohol use: No  . Drug use: No  . Sexual activity: Not on file  Other Topics Concern  . Not on file  Social History Narrative  . Not on file   Social Determinants of Health   Financial Resource Strain: Not on file  Food Insecurity: No Food Insecurity  . Worried About Charity fundraiser in the Last Year: Never true  . Ran Out of Food in the Last Year: Never true  Transportation Needs: No Transportation Needs  . Lack of Transportation (Medical): No  . Lack of Transportation (Non-Medical): No  Physical Activity: Not on file  Stress: Not on file  Social Connections: Not on file     Family History:  The patient's family history includes CAD in his brother.   ROS:   Please see the history of present illness.    ROS All other systems reviewed and are  negative.   PHYSICAL EXAM:   VS:  BP (!) 128/58   Pulse 76   Ht 5\' 10"  (1.778 m)   Wt 153 lb 12.8 oz (69.8 kg)   BMI 22.07 kg/m   Physical Exam  GEN: Thin, pale, elderly, in no acute distress  Neck: no JVD, carotid bruits, or masses Cardiac:RRR; no murmurs, rubs, or gallops  Respiratory:  clear to auscultation bilaterally, normal work of breathing GI: soft, nontender, nondistended, + BS Ext: without cyanosis, clubbing, or edema, Good distal pulses bilaterally Neuro:  Alert and Oriented x 3 Psych: euthymic mood, full affect  Wt Readings from Last 3 Encounters:  09/11/20 153 lb 12.8 oz (69.8 kg)  09/09/20 157 lb (71.2 kg)  08/24/20 152 lb (68.9 kg)      Studies/Labs Reviewed:   EKG:  EKG is not ordered today.  The ekg  Reviewed 09/09/20 NSR with LBBB Recent Labs: 07/05/2020: TSH 2.525 08/04/2020: B Natriuretic Peptide 656.9 08/10/2020: Magnesium 1.9 09/11/2020: ALT 19; BUN 18; Creatinine, Ser 1.21; Hemoglobin 8.9; Platelets 673; Potassium 4.2; Sodium 137   Lipid Panel    Component Value Date/Time   CHOL 65 09/09/2020 0402   TRIG 94 09/09/2020 0402   HDL 21 (L) 09/09/2020 0402   CHOLHDL 3.1 09/09/2020 0402   VLDL 19 09/09/2020 0402   LDLCALC 25 09/09/2020 0402    Additional studies/ records that were reviewed today include:  Echo 1. Left ventricular ejection fraction, by estimation, is 40%. LVEF is  depressed with akinesis of the inferior/inferoseptal base with aneurysmal  dilitation. There is also akinesis of the apex. Compared to echo from  January 2022, the apical thrombus is no   longer present. The diastolic parameters are consistent with Grade I  diastolic dysfunction (impaired relaxation).   2. Right ventricular systolic function is normal. The right ventricular  size is normal.   3. Left atrial size was mildly dilated.   4. The mitral valve is abnormal. Mild mitral valve regurgitation.   5. The aortic valve is normal in structure. Aortic valve regurgitation  is  mild.   FINDINGS  Echo 08/06/20 IMPRESSIONS     1. Probable LV  apical thrombus. 1.5 x 1.5 cm, with heterogeneous echo  texture. Centrally more echo lucent.   2. Left ventricular ejection fraction, by estimation, is 45%. The left  ventricle has mildly decreased function. The left ventricle demonstrates  regional wall motion abnormalities, large basal inferior and inferoseptal  aneurysm, which appears unchanged  compared to intraoperative TEE. Left ventricular diastolic parameters are  indeterminate.   3. Right ventricular systolic function is moderately reduced. The right  ventricular size is normal. There is normal pulmonary artery systolic  pressure. The estimated right ventricular systolic pressure is 65.6 mmHg.   4. The mitral valve is grossly normal. Mild mitral valve regurgitation.   5. The aortic valve is grossly normal. Aortic valve regurgitation is  mild. No aortic stenosis is present.   6. Aortic dilatation noted. There is borderline dilatation of the  ascending aorta, measuring 38 mm.   7. The inferior vena cava is normal in size with greater than 50%  respiratory variability, suggesting right atrial pressure of 3 mmHg.   Conclusion(s)/Recommendation(s): Critical findings reported to Dr.  Zorita Pang and acknowledged at 4:26 PM 08/06/20.      Left Cardiac Catheterization 07/04/2020:  Ost LAD to Prox LAD lesion is 40% stenosed.  2nd Diag lesion is 90% stenosed.  1st Diag-1 lesion is 95% stenosed.  1st Diag-2 lesion is 90% stenosed with 90% stenosed side branch in Lat 1st Diag.  Mid LAD-1 lesion is 90% stenosed.  Mid LAD-2 lesion is 70% stenosed.  Lat 2nd Mrg lesion is 95% stenosed.  1st Mrg lesion is 20% stenosed.  Prox RCA lesion is 95% stenosed.  Dist LAD lesion is 50% stenosed.   Severe coronary obstructive disease with significant calcification involving the LAD.  The LAD is diffusely diseased extending from the proximal to mid segment with 40%  diffuse proximal stenosis, calcified 90% stenosis on a bend in the vessel in the region of the first and second diagonal vessels with 95% stenoses in the ostium of the first diagonal and 90% in the second diagonal, 70% mid stenosis and 50% mid distal stenoses.   The circumflex vessel has 20% smooth narrowing in the first marginal branch with 95% focal stenosis in a branch of the second marginal vessel.   The RCA has a focal 95% mid stenosis with reduced antegrade distal flow secondary to competitive left-to-right collateralization.   Normal LV function with EF estimated at 55 mm; LVEDP 12 mm Hg.   Recommendation: Surgical consultation for CABG revascularization.  Patient will need to hold Plavix and will need Plavix washout. _______________   Echocardiogram 08/07/2019: Impressions: 1. Probable LV apical thrombus. 1.5 x 1.5 cm, with heterogeneous echo  texture. Centrally more echo lucent.   2. Left ventricular ejection fraction, by estimation, is 45%. The left  ventricle has mildly decreased function. The left ventricle demonstrates  regional wall motion abnormalities, large basal inferior and inferoseptal  aneurysm, which appears unchanged  compared to intraoperative TEE. Left ventricular diastolic parameters are  indeterminate.   3. Right ventricular systolic function is moderately reduced. The right  ventricular size is normal. There is normal pulmonary artery systolic  pressure. The estimated right ventricular systolic pressure is 81.2 mmHg.   4. The mitral valve is grossly normal. Mild mitral valve regurgitation.   5. The aortic valve is grossly normal. Aortic valve regurgitation is  mild. No aortic stenosis is present.   6. Aortic dilatation noted. There is borderline dilatation of the  ascending aorta, measuring 38 mm.   7.  The inferior vena cava is normal in size with greater than 50%  respiratory variability, suggesting right atrial pressure of 3 mmHg.    Conclusion(s)/Recommendation(s): Critical findings reported to Dr.  Zorita Pang and acknowledged at 4:26 PM 08/06/20.     Risk Assessment/Calculations:    CHA2DS2-VASc Score = 7  This indicates a 11.2% annual risk of stroke. The patient's score is based upon: CHF History: Yes HTN History: Yes Diabetes History: No Stroke History: Yes Vascular Disease History: Yes Age Score: 2 Gender Score: 0        ASSESSMENT:    1. History of CVA (cerebrovascular accident)   2. Coronary artery disease involving coronary bypass graft of native heart without angina pectoris   3. Thrombus in heart chamber   4. Paroxysmal atrial fibrillation (HCC)   5. Ischemic cardiomyopathy   6. Chronic kidney disease, stage 3a ([place])   7. Hyperlipidemia, unspecified hyperlipidemia type   8. History of pulmonary embolism      PLAN:  In order of problems listed above:  CVA 2 subacute  on chronic infarcts on CT and MRI neurology felt secondary to CABG 06/2020 vs Afib with LV thrombus. Switched coumadin to Eliquis for better control. History of pontine CVA 2013.Also vascular dementia to f/u with neurology who will have to weigh in on when it's safe to hold eliquis for endo/colonoscopy.  Coronary Artery Disease;S/P CABG 06/2020-no angina, very slow to recover.   New LV Thrombus:found incidentally on echo 08/06/20- likely secondary to aneurysm & remodeling started on coumadin but with INR's up to 8 and difficult to control, neurology switched patient to Eliquis today. Echo 09/09/20 no LV thrombus but LVEF 40%   Paroxsymal Atrial Fibrillation, Prior PE, Prior Stroke, DM, HTN, CAD as below, HFmrEF as below- CHADSVASC=8.- in NSR on EKG 09/09/20   Ischemic cardiomyopathy EF now 40%,  compensated. Can't add meds with low BP's and CKD but  Crt 1.21 today. Consider ARB/Entresto in future but too fragile at this time-Dr. Domenic Polite concurs.   CKD IIIa Crt 1.21 today     HLD on statin LDL 25   Abdominal  pain-ongoing GI symptoms with no clear diagnosis, 20 lb weight loss since CABG, anemia-Hbg 8.9. GI wants to do endo/colonoscopy. Patient on Eliquis for LV thrombus which has resolved, Afib and now CVA. Discussed with Dr. Domenic Polite. He will have to be cleared by neurology to hold eliquis with recent CVA. Should be stable from cardiac standpoint.   History of PE post covid 11/2019       Shared Decision Making/Informed Consent        Medication Adjustments/Labs and Tests Ordered: Current medicines are reviewed at length with the patient today.  Concerns regarding medicines are outlined above.  Medication changes, Labs and Tests ordered today are listed in the Patient Instructions below. Patient Instructions  Medication Instructions:  Your physician recommends that you continue on your current medications as directed. Please refer to the Current Medication list given to you today.  *If you need a refill on your cardiac medications before your next appointment, please call your pharmacy*   Lab Work: NONE   If you have labs (blood work) drawn today and your tests are completely normal, you will receive your results only by: Marland Kitchen MyChart Message (if you have MyChart) OR . A paper copy in the mail If you have any lab test that is abnormal or we need to change your treatment, we will call you to review the results.   Testing/Procedures: NONE  Follow-Up: At [facility], you and your health needs are our priority.  As part of our continuing mission to provide you with exceptional heart care, we have created designated Provider Care Teams.  These Care Teams include your primary Cardiologist (physician) and Advanced Practice Providers (APPs -  Physician Assistants and Nurse Practitioners) who all work together to provide you with the care you need, when you need it.  We recommend signing up for the patient portal called "MyChart".  Sign up information is provided on this After Visit Summary.   MyChart is used to connect with patients for Virtual Visits (Telemedicine).  Patients are able to view lab/test results, encounter notes, upcoming appointments, etc.  Non-urgent messages can be sent to your provider as well.   To learn more about what you can do with MyChart, go to [email or web address].    Your next appointment:    Next available   The format for your next appointment:   In Person  Provider:   Rozann Lesches, MD   Other Instructions Thank you for choosing [facility]!       Sumner Boast, PA-C  09/11/2020 3:43 PM    [facility] [place], [place], [place]  [address] Phone: [phone]; Fax: [phone]

## 2020-09-10 NOTE — Procedures (Signed)
Patient Name: Patrick Miranda  MRN: [id number]  Epilepsy Attending: Lora Havens  Referring Physician/Provider: Dr Bonnielee Haff Date: 09/10/2020 Duration: 25.30 mins  Patient history: 75 year old male with altered mental status.  EEG to evaluate for seizures.  EEG  Level of alertness: Awake  AEDs during EEG study: None  Technical aspects: This EEG study was done with scalp electrodes positioned according to the 10-20 International system of electrode placement. Electrical activity was acquired at a sampling rate of 500Hz  and reviewed with a high frequency filter of 70Hz  and a low frequency filter of 1Hz . EEG data were recorded continuously and digitally stored.   Description: The posterior dominant rhythm consists of 7.5 Hz activity of moderate voltage (25-35 uV) seen predominantly in posterior head regions, symmetric and reactive to eye opening and eye closing. EEG showed intermittent generalized 3 to 6 Hz theta-delta slowing. Hyperventilation and photic stimulation were not performed.     ABNORMALITY -Intermittent slow, generalized  IMPRESSION: This study is suggestive of mild diffuse encephalopathy, nonspecific etiology. No seizures or epileptiform discharges were seen throughout the recording.  Patrick Miranda Patrick Miranda

## 2020-09-10 NOTE — Progress Notes (Signed)
[place] for Warfarin >> apixaban Indication: LV thrombus  Allergies  Allergen Reactions  . Lipitor [Atorvastatin] Other (See Comments)    Muscle pain  . Sitagliptin     Other reaction(s): myalgias    Patient Measurements: Height: 5\' 10"  (177.8 cm) Weight: 71.2 kg (157 lb) IBW/kg (Calculated) : 73  Vital Signs: Temp: 98.2 F (36.8 C) (02/21 0638) Temp Source: Oral (02/21 9242) BP: 101/65 (02/21 1043) Pulse Rate: 96 (02/21 1043)  Labs: Recent Labs    09/08/20 1816 09/09/20 0127 09/10/20 0419  HGB 8.5*  --  8.8*  HCT 27.9*  --  28.9*  PLT 783*  --  706*  LABPROT  --  26.3* 21.7*  INR  --  2.5* 2.0*  CREATININE 1.19  --  1.41*    Estimated Creatinine Clearance: 45.6 mL/min (A) (by C-G formula based on SCr of 1.41 mg/dL (H)).   Medical History: Past Medical History:  Diagnosis Date  . Arthritis   . 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 Cancer (HCC)    Skin  . Cerebral artery disease   . Coronary artery disease   . COVID-07 Dec 2019  . COVID-19 virus infection 12/02/2019  . Depression   . Essential hypertension   . Hernia, umbilical   . HOH (hard of hearing)   . Hyperlipidemia   . NSTEMI (non-ST elevated myocardial infarction) ([place])   . OSA on CPAP   . Pulmonary emboli [facility])    May 2021  . Type 2 diabetes mellitus (HCC)     Assessment: 75 y.o. M presents with AMS, abd pain. Pt on warfarin PTA for LV thrombus found 07/2020, hx afib. Admission INR 2.5 - therapeutic. INR down to 2.0 today (likely due to missed dose on 2/19). CBC stable. No bleeding issues reported.  Home dose: 2mg  daily - last dose 2/18  2/21 PM Update: pharmacy consulted to switch warfarin >> apixaban due to difficulty with controlling INR.  Patient was previously on apixaban for 6 months d/t DVT/PE (ended 06/2020). Will initiate apixaban 5mg  BID since patient has been on anticoagulation previously.  Start once INR <2  Goal of Therapy:  Monitor  platelets by anticoagulation protocol: Yes   Plan:  D/c warfarin Start apixaban 5mg  BID once INR < 2 Check INR tomorrow morning Monitor CBC, s/sx bleeding  Dimple Nanas, PharmD PGY-1 Acute Care Pharmacy Resident Office: [phone] 09/10/2020 1:35 PM

## 2020-09-10 NOTE — ED Notes (Signed)
Pt ambulate to the bathroom with shuffled but steady gait, per wife this is his baseline.

## 2020-09-10 NOTE — Evaluation (Signed)
.Physical Therapy Evaluation Patient Details Name: Patrick Miranda MRN: [id number] DOB: [birth year] Today's Date: 09/10/2020   History of Present Illness  Pt is a 75 y.o. male presenting to ED following worsening confusion as well as nausea, diarrhea, and abdominal pain. Pt diagnosed with metabolic encephalopathy and suspected subacute L frontal and R parietal infarcts. Pt also had CABG in December 2021 and was admitted again in January 2022 for nausea and vomiting. In January, found to have a LV thrombus and started on anticoagulant. PMH also significant for CAD, T2DM, HTN, A-fib, hx of PE, TKA, previous brainstem CVA, vascular dementia, and Covid-19 in May 2021.    Clinical Impression  Pt received in bed, cooperative and pleasant. Pt's wife present for entire session. A&Ox1. Disoriented to exact place (knows he's at the hospital but not which one), time, and situation. Supervision for bed mobility and min guard for safety for OOB mobility, including ~120 ft ambulation. Able to follow simple commands consistently, but demonstrated decreased awareness, attention, memory, and problem solving. At end of session, only able to recall 1 out of 3 words he was asked to memorize. When asked to spell "world," needed increased time for processing and spelt it "[id number]." When asked to count backwards from 100 by 7 during ambulation, unable to do so but started counting upwards by 1. Mobility seems similar to baseline, but safety issues arise due to cognition. Wife stated that she feels prepared and would prefer to take him home, once his stomach issues are solved. Recommend supervision for mobility/OOB due to cognitive status. Pt left in bed with all needs met, call bell within reach, and wife present in room.     Follow Up Recommendations Supervision for mobility/OOB;No PT follow up    Equipment Recommendations  3in1 (PT)    Recommendations for Other Services       Precautions / Restrictions  Precautions Precautions: Fall Precaution Comments: Low fall, sternal s/p CABG in Dec 2021 Restrictions Weight Bearing Restrictions: No      Mobility  Bed Mobility Overal bed mobility: Needs Assistance Bed Mobility: Rolling;Sidelying to Sit Rolling: Supervision Sidelying to sit: Supervision            Transfers Overall transfer level: Needs assistance Equipment used: None Transfers: Sit to/from Stand Sit to Stand: Min guard            Ambulation/Gait Ambulation/Gait assistance: Min guard Gait Distance (Feet): 120 Feet Assistive device: None Gait Pattern/deviations: Decreased step length - right;Decreased step length - left;Step-to pattern;Shuffle Gait velocity: Decreased   General Gait Details: Min guard for safety, no overt LOB, no signs of fatigue or overexertion. Slower speed when counting backwards task added.  Stairs            Wheelchair Mobility    Modified Rankin (Stroke Patients Only)       Balance Overall balance assessment: Needs assistance Sitting-balance support: Feet supported Sitting balance-Leahy Scale: Good     Standing balance support: No upper extremity supported;During functional activity Standing balance-Leahy Scale: Fair                               Pertinent Vitals/Pain Pain Assessment: No/denies pain    Home Living Family/patient expects to be discharged to:: Private residence Living Arrangements: Spouse/significant other Available Help at Discharge: Family;Available 24 hours/day Type of Home: House Home Access: Level entry     Home Layout: One level;Laundry or work area in basement  Home Equipment: [place] - 2 wheels;Bedside commode;Shower seat;Grab bars - tub/shower;Hand held shower head Additional Comments: has been getting HHPT and was ready to DC soon    Prior Function Level of Independence: Independent         Comments: Independent in all tasks, drives, yardwork, grocery shopping     Hand  Dominance        Extremity/Trunk Assessment   Upper Extremity Assessment Upper Extremity Assessment: Overall WFL for tasks assessed    Lower Extremity Assessment Lower Extremity Assessment: Overall WFL for tasks assessed    Cervical / Trunk Assessment Cervical / Trunk Assessment: Normal  Communication   Communication: No difficulties  Cognition Arousal/Alertness: Awake/alert Behavior During Therapy: WFL for tasks assessed/performed Overall Cognitive Status: Impaired/Different from baseline Area of Impairment: Orientation;Attention;Memory;Awareness;Problem solving                 Orientation Level: Disoriented to;Place;Time;Situation Current Attention Level: Sustained Memory: Decreased recall of precautions;Decreased short-term memory     Awareness: Emergent Problem Solving: Slow processing;Decreased initiation;Difficulty sequencing;Requires verbal cues General Comments: Able to state that he is at "[facility]" in [place], but unaware that when asked if he is at [facility] or [facility], he states that he is at neither. When asked the month, he repeatedly says "3". He knew he was at the hospital for treatment but stated "it's for my spine or something like that." When asked to spell "world," pt spelled it "wart" needing increased time to process. At end of session, able to memorize 1 out of 3 words he was asked to remember during session. Pt is able to consistently follow simple commands and will outwardly state when he doesn't think he is able to complete a task. For example, he say he could walk counting normally but "I can't walk counting backwards." When asked to count backwards by 7 starting at 100 during ambulation, pt unable to do so. Started at 95 counting upwards by 1. Wife reports pt's confusion has been worsening.      General Comments General comments (skin integrity, edema, etc.): Reviewed sternal precautions prior to mobility. Pt demonstrated  understanding    Exercises     Assessment/Plan    PT Assessment Patient needs continued PT services  PT Problem List Decreased strength;Decreased cognition;Decreased activity tolerance;Decreased balance;Decreased mobility;Decreased coordination       PT Treatment Interventions Balance training;Gait training;Neuromuscular re-education;Stair training;Cognitive remediation;Functional mobility training;Patient/family education;Therapeutic activities;Therapeutic exercise    PT Goals (Current goals can be found in the Care Plan section)  Acute Rehab PT Goals Patient Stated Goal: go home, figure out stomach issues PT Goal Formulation: With patient Potential to Achieve Goals: Good    Frequency Min 3X/week   Barriers to discharge        Co-evaluation               AM-PAC PT "6 Clicks" Mobility  Outcome Measure Help needed turning from your back to your side while in a flat bed without using bedrails?: None Help needed moving from lying on your back to sitting on the side of a flat bed without using bedrails?: None Help needed moving to and from a bed to a chair (including a wheelchair)?: None Help needed standing up from a chair using your arms (e.g., wheelchair or bedside chair)?: None Help needed to walk in hospital room?: A Little Help needed climbing 3-5 steps with a railing? : A Little 6 Click Score: 22    End of Session Equipment Utilized  During Treatment: Gait belt Activity Tolerance: Patient tolerated treatment well Patient left: in bed;with family/visitor present Nurse Communication: Mobility status PT Visit Diagnosis: Other abnormalities of gait and mobility (R26.89);Muscle weakness (generalized) (M62.81)    Time:  -      Charges:             Rosita Kea, SPT

## 2020-09-11 ENCOUNTER — Ambulatory Visit: Payer: Medicare HMO | Admitting: Physician Assistant

## 2020-09-11 ENCOUNTER — Other Ambulatory Visit: Payer: Self-pay

## 2020-09-11 ENCOUNTER — Encounter: Payer: Self-pay | Admitting: Physician Assistant

## 2020-09-11 VITALS — BP 128/58 | HR 76 | Ht 70.0 in | Wt 153.8 lb

## 2020-09-11 DIAGNOSIS — N1831 Chronic kidney disease, stage 3a: Secondary | ICD-10-CM

## 2020-09-11 DIAGNOSIS — Z86711 Personal history of pulmonary embolism: Secondary | ICD-10-CM

## 2020-09-11 DIAGNOSIS — E785 Hyperlipidemia, unspecified: Secondary | ICD-10-CM

## 2020-09-11 DIAGNOSIS — I480 Paroxysmal atrial fibrillation: Secondary | ICD-10-CM | POA: Diagnosis not present

## 2020-09-11 DIAGNOSIS — I255 Ischemic cardiomyopathy: Secondary | ICD-10-CM

## 2020-09-11 DIAGNOSIS — I513 Intracardiac thrombosis, not elsewhere classified: Secondary | ICD-10-CM

## 2020-09-11 DIAGNOSIS — I25810 Atherosclerosis of coronary artery bypass graft(s) without angina pectoris: Secondary | ICD-10-CM

## 2020-09-11 DIAGNOSIS — Z8673 Personal history of transient ischemic attack (TIA), and cerebral infarction without residual deficits: Secondary | ICD-10-CM | POA: Diagnosis not present

## 2020-09-11 LAB — CBC
HCT: 29.1 % — ABNORMAL LOW (ref 39.0–52.0)
Hemoglobin: 8.9 g/dL — ABNORMAL LOW (ref 13.0–17.0)
MCH: 26.6 pg (ref 26.0–34.0)
MCHC: 30.6 g/dL (ref 30.0–36.0)
MCV: 86.9 fL (ref 80.0–100.0)
Platelets: 673 10*3/uL — ABNORMAL HIGH (ref 150–400)
RBC: 3.35 MIL/uL — ABNORMAL LOW (ref 4.22–5.81)
RDW: 17.2 % — ABNORMAL HIGH (ref 11.5–15.5)
WBC: 11.8 10*3/uL — ABNORMAL HIGH (ref 4.0–10.5)
nRBC: 0 % (ref 0.0–0.2)

## 2020-09-11 LAB — PROTIME-INR
INR: 1.6 — ABNORMAL HIGH (ref 0.8–1.2)
Prothrombin Time: 18.8 seconds — ABNORMAL HIGH (ref 11.4–15.2)

## 2020-09-11 LAB — COMPREHENSIVE METABOLIC PANEL
ALT: 19 U/L (ref 0–44)
AST: 34 U/L (ref 15–41)
Albumin: 1.6 g/dL — ABNORMAL LOW (ref 3.5–5.0)
Alkaline Phosphatase: 43 U/L (ref 38–126)
Anion gap: 9 (ref 5–15)
BUN: 18 mg/dL (ref 8–23)
CO2: 23 mmol/L (ref 22–32)
Calcium: 7.8 mg/dL — ABNORMAL LOW (ref 8.9–10.3)
Chloride: 105 mmol/L (ref 98–111)
Creatinine, Ser: 1.21 mg/dL (ref 0.61–1.24)
GFR, Estimated: >60 mL/min (ref >60)
Glucose, Bld: 159 mg/dL — ABNORMAL HIGH (ref 70–99)
Potassium: 4.2 mmol/L (ref 3.5–5.1)
Sodium: 137 mmol/L (ref 135–145)
Total Bilirubin: 0.8 mg/dL (ref 0.3–1.2)
Total Protein: 3.9 g/dL — ABNORMAL LOW (ref 6.5–8.1)

## 2020-09-11 LAB — GLUCOSE, CAPILLARY: Glucose-Capillary: 127 mg/dL — ABNORMAL HIGH (ref 70–99)

## 2020-09-11 MED ORDER — THIAMINE HCL 100 MG PO TABS: 100.0000 mg | ORAL_TABLET | Freq: Every day | ORAL | 0 refills | Status: DC

## 2020-09-11 MED ORDER — APIXABAN 5 MG PO TABS: 5.0000 mg | ORAL_TABLET | Freq: Two times a day (BID) | ORAL | 2 refills | Status: DC

## 2020-09-11 MED ORDER — METOPROLOL TARTRATE 25 MG PO TABS: 12.5000 mg | ORAL_TABLET | Freq: Two times a day (BID) | ORAL | 1 refills | Status: DC

## 2020-09-11 MED ORDER — APIXABAN 5 MG PO TABS
5.0000 mg | ORAL_TABLET | Freq: Two times a day (BID) | ORAL | Status: DC
  Administered 2020-09-11: 5 mg via ORAL
  Filled 2020-09-11: qty 1

## 2020-09-11 MED ORDER — ONDANSETRON HCL 4 MG/2ML IJ SOLN
4.0000 mg | Freq: Once | INTRAMUSCULAR | Status: AC
  Administered 2020-09-11: 4 mg via INTRAVENOUS
  Filled 2020-09-11: qty 2

## 2020-09-11 MED ORDER — ASPIRIN 81 MG PO TBEC: 81.0000 mg | DELAYED_RELEASE_TABLET | Freq: Every day | ORAL | 11 refills | Status: DC

## 2020-09-11 MED ORDER — ASPIRIN EC 81 MG PO TBEC
81.0000 mg | DELAYED_RELEASE_TABLET | Freq: Every day | ORAL | Status: DC
Start: 2020-09-12 — End: 2020-09-11

## 2020-09-11 NOTE — Discharge Summary (Signed)
[facility]  Physician Discharge Summary   Patient ID: Patrick Miranda MRN: [id number] DOB/AGE: June 03, 1946 75 y.o.  Admit date: 09/08/2020 Discharge date: 09/11/2020  PCP: Burnard Bunting, MD  DISCHARGE DIAGNOSES:  Acute metabolic encephalopathy likely steroid induced, resolved History of stroke History of left ventricular thrombus History of coronary artery disease status post CABG Chronic abdominal pain and chronic diarrhea Incidental cholelithiasis Incidental left renal stone Paroxysmal atrial fibrillation Diabetes mellitus type 2 Chronic kidney disease stage IIIa  RECOMMENDATIONS FOR OUTPATIENT FOLLOW UP: 1. Patient to follow-up with cardiology.  He has an appointment this afternoon 2. Patient changed over from warfarin to Eliquis based on neurology recommendation 3. PCP to consider referral to local gastroenterology. 4. Ambulatory referral sent to [facility] for outpatient follow-up   Home Health: Home health PT Equipment/Devices: None  CODE STATUS: Full code  DISCHARGE CONDITION: fair  Diet recommendation: Modified carbohydrate  INITIAL HISTORY: 75 y.o.malewith hx of CAD, insulin-dependent type 2 diabetes, hypertension, dyslipidemia, urinary retention, AV thrombus, paroxysmal A. fib, cardiomyopathywho presentedto the hospital on 09/08/2020 secondary to worsening encephalopathy. Patient was started on prednisone by his gastroenterologist for ongoing GI issues about 6 days prior to admission. CT scan of the head raised concern for acute stroke. Patient was seen by neurology. Hospitalized for further management.  Consultants: Neurology  Procedures:  EEG IMPRESSION: This study is suggestive of mild diffuse encephalopathy, nonspecific etiology. No seizures or epileptiform discharges were seen throughout the recording.  Echocardiogram See report below   HOSPITAL COURSE:   Acute metabolic encephalopathy Thought to be  multifactorial including stroke and medication induced. However MRI brain did not show any acute stroke. Remote infarcts were noted. Patient was started on prednisone about 6 days prior to admission. Prednisone seems to be the most likely culprit.  Prednisone has been discontinued. EEG was done and did not show any epileptiform activity. Ammonia level was normal.  Infectious work-up was unremarkable.   History of stroke Patient noted to have remote strokes on imaging studies.  Discussed with neurology who evaluated patient.  They think that patient has had strokes around the time of his CABG surgery.  LDL is noted to be 25.  HbA1c 6.8.  MRI did not show any acute stroke.   Neurology does recommend changing from warfarin to Eliquis which may make sense since patient has had difficulty getting his INR regulated.    This was discussed with cardiology.  Echocardiogram done during this admission did not show any LV thrombus.  It was felt it was reasonable to transition the patient from warfarin to Eliquis.  This change has been made this morning.  History of for left ventricular thrombus Patient was on warfarin prior to admission.  Has had large difficulty regulating the INR.  Echocardiogram done during this admission shows resolution of the LV thrombus.  Okay to transition to Eliquis.  See discussion above.    Coronary artery disease status post CABG He recently underwent CABG in December. He is followed by Dr. Domenic Polite who is his cardiologist.  Seems to be stable from cardiac standpoint.  Continue beta-blocker aspirin and statin.    Chronic abdominal pain and diarrhea/incidental cholelithiasis Followed at [facility] gastroenterology, Dr. Earlean Shawl. Started on prednisone recently for GI issues. Apparently he has soft stools but very frequent.  Steroids discontinued due to encephalopathy. CT scan renal study did not show any acute intra-abdominal process. Cholelithiasis was noted incidentally.   Abdomen was benign.  LFTs were unremarkable.  According to the wife  the plan is to pursue upper endoscopy and Colonoscopy in the near future once cleared by his cardiologist.  They may consider seeing a gastroenterologist in the [place] area.  Incidental left renal stone Outpatient monitoring. Currently asymptomatic.  History of paroxysmal atrial fibrillation Metoprolol being continued.  Anticoagulation has been changed over to Eliquis.  Diabetes mellitus type 2, uncontrolled with hyperglycemia HbA1c 6.8.  Poorly controlled CBGs were likely due to steroids.  He may resume his home medication regimen.  Steroid has been discontinued.  Previous history of urinary retention Continue Flomax and finasteride.   Normocytic anemia Seems to be stable. No evidence of overt blood loss.  He was given iron infusion here in the hospital.  He was supposed to get it in the outpatient setting.  Chronic kidney disease stage 3a Renal function noted to be close to baseline.    Patient is stable.  Okay for discharge home today.  Discussed with wife.   PERTINENT LABS:  The results of significant diagnostics from this hospitalization (including imaging, microbiology, ancillary and laboratory) are listed below for reference.    Microbiology: Recent Results (from the past 240 hour(s))  Resp Panel by RT-PCR (Flu A&B, Covid) Nasopharyngeal Swab     Status: None   Collection Time: 09/09/20  1:27 AM   Specimen: Nasopharyngeal Swab; Nasopharyngeal(NP) swabs in vial transport medium  Result Value Ref Range Status   SARS Coronavirus 2 by RT PCR NEGATIVE NEGATIVE Final    Comment: (NOTE) SARS-CoV-2 target nucleic acids are NOT DETECTED.  The SARS-CoV-2 RNA is generally detectable in upper respiratory specimens during the acute phase of infection. The lowest concentration of SARS-CoV-2 viral copies this assay can detect is 138 copies/mL. A negative result does not preclude SARS-Cov-2 infection and  should not be used as the sole basis for treatment or other patient management decisions. A negative result may occur with  improper specimen collection/handling, submission of specimen other than nasopharyngeal swab, presence of viral mutation(s) within the areas targeted by this assay, and inadequate number of viral copies(<138 copies/mL). A negative result must be combined with clinical observations, patient history, and epidemiological information. The expected result is Negative.  Fact Sheet for Patients:  EntrepreneurPulse.com.au  Fact Sheet for Healthcare Providers:  IncredibleEmployment.be  This test is no t yet approved or cleared by the [place] FDA and  has been authorized for detection and/or diagnosis of SARS-CoV-2 by FDA under an Emergency Use Authorization (EUA). This EUA will remain  in effect (meaning this test can be used) for the duration of the COVID-19 declaration under Section 564(b)(1) of the Act, 21 U.S.C.section 360bbb-3(b)(1), unless the authorization is terminated  or revoked sooner.       Influenza A by PCR NEGATIVE NEGATIVE Final   Influenza B by PCR NEGATIVE NEGATIVE Final    Comment: (NOTE) The Xpert Xpress SARS-CoV-2/FLU/RSV plus assay is intended as an aid in the diagnosis of influenza from Nasopharyngeal swab specimens and should not be used as a sole basis for treatment. Nasal washings and aspirates are unacceptable for Xpert Xpress SARS-CoV-2/FLU/RSV testing.  Fact Sheet for Patients: EntrepreneurPulse.com.au  Fact Sheet for Healthcare Providers: IncredibleEmployment.be  This test is not yet approved or cleared by the [place] FDA and has been authorized for detection and/or diagnosis of SARS-CoV-2 by FDA under an Emergency Use Authorization (EUA). This EUA will remain in effect (meaning this test can be used) for the duration of the COVID-19 declaration  under Section 564(b)(1) of the Act, 21 U.S.C.  section 360bbb-3(b)(1), unless the authorization is terminated or revoked.  Performed at [facility], [place] [address], [place], [place] [address]      Labs:  COVID-19 Labs   Lab Results  Component Value Date   SARSCOV2NAA NEGATIVE 09/09/2020   [place] NEGATIVE 08/03/2020   [place] NEGATIVE 07/02/2020   [place] Not Detected 03/13/2020      Basic Metabolic Panel: Recent Labs  Lab 09/08/20 1816 09/10/20 0419 09/11/20 0322  NA 135 136 137  K 4.5 4.6 4.2  CL 104 105 105  CO2 21* 23 23  GLUCOSE 257* 147* 159*  BUN 21 21 18   CREATININE 1.19 1.41* 1.21  CALCIUM 8.1* 7.7* 7.8*   Liver Function Tests: Recent Labs  Lab 09/08/20 1816 09/10/20 0419 09/11/20 0322  AST 44* 50* 34  ALT 19 22 19   ALKPHOS 52 47 43  BILITOT 0.4 0.4 0.8  PROT 4.7* 4.0* 3.9*  ALBUMIN 1.9* 1.7* 1.6*   Recent Labs  Lab 09/08/20 1816  LIPASE 59*   Recent Labs  Lab 09/09/20 0402  AMMONIA 22   CBC: Recent Labs  Lab 09/08/20 1816 09/10/20 0419 09/11/20 0322  WBC 10.7* 9.8 11.8*  HGB 8.5* 8.8* 8.9*  HCT 27.9* 28.9* 29.1*  MCV 89.4 89.2 86.9  PLT 783* 706* 673*   BNP: BNP (last 3 results) Recent Labs    12/02/19 1558 08/04/20 0237  BNP 439.8* 656.9*    CBG: Recent Labs  Lab 09/10/20 0804 09/10/20 1303 09/10/20 1555 09/10/20 2141 09/11/20 0616  GLUCAP 120* 212* 215* 177* 127*     IMAGING STUDIES CT Head Wo Contrast  Result Date: 09/08/2020 CLINICAL DATA:  Mental status changes of unknown cause, confusion. History coronary artery disease post MI, hypertension, type II diabetes mellitus, history COVID-19 in May 2021 EXAM: CT HEAD WITHOUT CONTRAST TECHNIQUE: Contiguous axial images were obtained from the base of the skull through the vertex without intravenous contrast. Sagittal and coronal MPR images reconstructed from axial data set. COMPARISON:  08/24/2020 FINDINGS: Brain: Generalized atrophy. Normal  ventricular morphology. No midline shift or mass effect. Evolving subacute infarct LEFT frontal lobe, better defined than on previous exam. Subacute to old posterior RIGHT parietal infarct little changed. Small vessel chronic ischemic changes of deep cerebral white matter. No intracranial hemorrhage, mass lesion, or additional infarct identified. No extra-axial fluid collections. Vascular: No hyperdense vessels. Atherosclerotic calcifications of internal carotid arteries bilaterally at skull base Skull: Intact Sinuses/Orbits: Clear Other: N/A IMPRESSION: Evolving subacute infarct LEFT frontal lobe since prior study. Subacute to old posterior RIGHT parietal infarct little changed. Atrophy with small vessel chronic ischemic changes of deep cerebral white matter. No new intracranial abnormalities. Electronically Signed   By: Lavonia Dana M.D.   On: 09/08/2020 19:19   CT Head Wo Contrast  Result Date: 08/24/2020 CLINICAL DATA:  Dysuria, diarrhea, right inguinal pain, recent fall out of bed EXAM: CT HEAD WITHOUT CONTRAST TECHNIQUE: Contiguous axial images were obtained from the base of the skull through the vertex without intravenous contrast. COMPARISON:  09/05/2011 FINDINGS: Brain: There are confluent hypodensities throughout the periventricular white matter, most compatible with chronic small vessel ischemic change. Focal encephalomalacia within the right parietal cortex consistent with prior infarct. There is hypodensity within the left frontal cortex, compatible with age indeterminate infarct, favor subacute to chronic. No evidence of acute hemorrhage. The lateral ventricles and remaining midline structures are unremarkable. No acute extra-axial fluid collections. No mass effect. Vascular: Extensive atherosclerosis.  No hyperdense vessel. Skull: Normal. Negative  for fracture or focal lesion. Sinuses/Orbits: No acute finding. Other: None. IMPRESSION: 1. Age-indeterminate left frontal cortical infarct, favor  subacute to chronic. 2. Chronic ischemic changes within the right parietal cortex and throughout the periventricular white matter. 3. No acute intracranial hemorrhage. Electronically Signed   By: Randa Ngo M.D.   On: 08/24/2020 23:07   MR ANGIO HEAD WO CONTRAST  Result Date: 09/09/2020 CLINICAL DATA:  Altered mental status for a few weeks EXAM: MRI HEAD WITHOUT CONTRAST MRA HEAD WITHOUT CONTRAST MRA NECK WITHOUT CONTRAST TECHNIQUE: Multiplanar, multiecho pulse sequences of the brain and surrounding structures were obtained without intravenous contrast. Angiographic images of the Circle of Willis were obtained using MRA technique without intravenous contrast. Angiographic images of the neck were obtained using MRA technique without intravenous contrast. Carotid stenosis measurements (when applicable) are obtained utilizing NASCET criteria, using the distal internal carotid diameter as the denominator. COMPARISON:  None. FINDINGS: MRI HEAD FINDINGS Brain: Patchy areas of increased T1 signal in the right temporal occipital cortex but chronic appearing on the other sequences demonstrating encephalomalacia and cortical laminar necrosis. Increased diffusion signal around the periphery of a clearly chronic anterior left frontal infarct which shows cortical laminar necrosis and dense encephalomalacia. No acute hemorrhage, hydrocephalus, or collection. Mild for age cerebral volume loss. Chronic small vessel ischemia with ischemic gliosis in the white matter and pons. Chronic lacune is at the right pons and left thalamus. Accentuated dilated perivascular spaces. Vascular: Preserved flow voids. Skull and upper cervical spine: Normal marrow signal Sinuses/Orbits: Unremarkable MRA HEAD FINDINGS Right dominant vertebral artery. 60% stenosis at the proximal left V4 segment. The basilar is diffusely patent. Hypoplastic left P1 segment with fetal type PCA flow. No branch occlusion, beading, or aneurysm in the posterior  circulation. Symmetric carotid artery size. No branch occlusion, beading, or proximal flow limiting stenosis. Mild narrowing at the anterior genu of the right cavernous ICA. Mild left M1 segment narrowing. Possible generalized atheromatous irregularity of MCA branches. MRA NECK FINDINGS Normal caliber of the aortic arch. Two vessel arch branching. The carotid and vertebral arteries are smoothly contoured and widely patent in the neck. Limited assessment of the left vertebral origin due to small vessel size and typical time-of-flight artifact. IMPRESSION: Brain MRI: 1. No acute finding. Areas of altered diffusion signal are related to remote infarcts scattered along the cerebral convexities. 2. Chronic small vessel ischemia including chronic pontine and left thalamic lacunes. Intracranial MRA: 1. Flow reducing stenosis at the non dominant left V4 segment. 2. Mild atheromatous changes in the anterior circulation. Neck MRA: Negative. Electronically Signed   By: Monte Fantasia M.D.   On: 09/09/2020 07:26   MR ANGIO NECK WO CONTRAST  Result Date: 09/09/2020 CLINICAL DATA:  Altered mental status for a few weeks EXAM: MRI HEAD WITHOUT CONTRAST MRA HEAD WITHOUT CONTRAST MRA NECK WITHOUT CONTRAST TECHNIQUE: Multiplanar, multiecho pulse sequences of the brain and surrounding structures were obtained without intravenous contrast. Angiographic images of the Circle of Willis were obtained using MRA technique without intravenous contrast. Angiographic images of the neck were obtained using MRA technique without intravenous contrast. Carotid stenosis measurements (when applicable) are obtained utilizing NASCET criteria, using the distal internal carotid diameter as the denominator. COMPARISON:  None. FINDINGS: MRI HEAD FINDINGS Brain: Patchy areas of increased T1 signal in the right temporal occipital cortex but chronic appearing on the other sequences demonstrating encephalomalacia and cortical laminar necrosis. Increased  diffusion signal around the periphery of a clearly chronic anterior left frontal infarct which shows  cortical laminar necrosis and dense encephalomalacia. No acute hemorrhage, hydrocephalus, or collection. Mild for age cerebral volume loss. Chronic small vessel ischemia with ischemic gliosis in the white matter and pons. Chronic lacune is at the right pons and left thalamus. Accentuated dilated perivascular spaces. Vascular: Preserved flow voids. Skull and upper cervical spine: Normal marrow signal Sinuses/Orbits: Unremarkable MRA HEAD FINDINGS Right dominant vertebral artery. 60% stenosis at the proximal left V4 segment. The basilar is diffusely patent. Hypoplastic left P1 segment with fetal type PCA flow. No branch occlusion, beading, or aneurysm in the posterior circulation. Symmetric carotid artery size. No branch occlusion, beading, or proximal flow limiting stenosis. Mild narrowing at the anterior genu of the right cavernous ICA. Mild left M1 segment narrowing. Possible generalized atheromatous irregularity of MCA branches. MRA NECK FINDINGS Normal caliber of the aortic arch. Two vessel arch branching. The carotid and vertebral arteries are smoothly contoured and widely patent in the neck. Limited assessment of the left vertebral origin due to small vessel size and typical time-of-flight artifact. IMPRESSION: Brain MRI: 1. No acute finding. Areas of altered diffusion signal are related to remote infarcts scattered along the cerebral convexities. 2. Chronic small vessel ischemia including chronic pontine and left thalamic lacunes. Intracranial MRA: 1. Flow reducing stenosis at the non dominant left V4 segment. 2. Mild atheromatous changes in the anterior circulation. Neck MRA: Negative. Electronically Signed   By: Monte Fantasia M.D.   On: 09/09/2020 07:26   MR BRAIN WO CONTRAST  Result Date: 09/09/2020 CLINICAL DATA:  Altered mental status for a few weeks EXAM: MRI HEAD WITHOUT CONTRAST MRA HEAD WITHOUT  CONTRAST MRA NECK WITHOUT CONTRAST TECHNIQUE: Multiplanar, multiecho pulse sequences of the brain and surrounding structures were obtained without intravenous contrast. Angiographic images of the Circle of Willis were obtained using MRA technique without intravenous contrast. Angiographic images of the neck were obtained using MRA technique without intravenous contrast. Carotid stenosis measurements (when applicable) are obtained utilizing NASCET criteria, using the distal internal carotid diameter as the denominator. COMPARISON:  None. FINDINGS: MRI HEAD FINDINGS Brain: Patchy areas of increased T1 signal in the right temporal occipital cortex but chronic appearing on the other sequences demonstrating encephalomalacia and cortical laminar necrosis. Increased diffusion signal around the periphery of a clearly chronic anterior left frontal infarct which shows cortical laminar necrosis and dense encephalomalacia. No acute hemorrhage, hydrocephalus, or collection. Mild for age cerebral volume loss. Chronic small vessel ischemia with ischemic gliosis in the white matter and pons. Chronic lacune is at the right pons and left thalamus. Accentuated dilated perivascular spaces. Vascular: Preserved flow voids. Skull and upper cervical spine: Normal marrow signal Sinuses/Orbits: Unremarkable MRA HEAD FINDINGS Right dominant vertebral artery. 60% stenosis at the proximal left V4 segment. The basilar is diffusely patent. Hypoplastic left P1 segment with fetal type PCA flow. No branch occlusion, beading, or aneurysm in the posterior circulation. Symmetric carotid artery size. No branch occlusion, beading, or proximal flow limiting stenosis. Mild narrowing at the anterior genu of the right cavernous ICA. Mild left M1 segment narrowing. Possible generalized atheromatous irregularity of MCA branches. MRA NECK FINDINGS Normal caliber of the aortic arch. Two vessel arch branching. The carotid and vertebral arteries are smoothly  contoured and widely patent in the neck. Limited assessment of the left vertebral origin due to small vessel size and typical time-of-flight artifact. IMPRESSION: Brain MRI: 1. No acute finding. Areas of altered diffusion signal are related to remote infarcts scattered along the cerebral convexities. 2. Chronic small vessel ischemia  including chronic pontine and left thalamic lacunes. Intracranial MRA: 1. Flow reducing stenosis at the non dominant left V4 segment. 2. Mild atheromatous changes in the anterior circulation. Neck MRA: Negative. Electronically Signed   By: Monte Fantasia M.D.   On: 09/09/2020 07:26   CT Abdomen Pelvis W Contrast  Result Date: 08/24/2020 CLINICAL DATA:  Right side abdominal pain EXAM: CT ABDOMEN AND PELVIS WITH CONTRAST TECHNIQUE: Multidetector CT imaging of the abdomen and pelvis was performed using the standard protocol following bolus administration of intravenous contrast. CONTRAST:  114mL OMNIPAQUE IOHEXOL 300 MG/ML  SOLN COMPARISON:  08/06/2020 FINDINGS: Lower chest: No acute abnormality. Hepatobiliary: Small layering gallstones within the gallbladder. No focal hepatic abnormality. Pancreas: No focal abnormality or ductal dilatation. Spleen: No focal abnormality.  Normal size. Adrenals/Urinary Tract: Bilateral renal parapelvic cysts. No hydronephrosis. Punctate nonobstructing stone in the midpole of the left kidney. Adrenal glands and bladder unremarkable. Stomach/Bowel: There are small bowel loops which are mildly thick walled in the left pelvis and mid pelvis which could reflect enteritis. Stomach and large bowel grossly unremarkable. Vascular/Lymphatic: Aortic atherosclerosis. No evidence of aneurysm or adenopathy. Reproductive: Mildly enlarged prostate. Other: No free fluid or free air. Umbilical hernia containing fat, unchanged. Musculoskeletal: No acute bony abnormality. IMPRESSION: Mild wall thickening within small bowel loops in the left and mid pelvis could reflect  enteritis. Cholelithiasis.  No CT evidence for acute cholecystitis. Aortic atherosclerosis. Stable small umbilical hernia containing fat. Electronically Signed   By: Rolm Baptise M.D.   On: 08/24/2020 23:14   EEG adult  Result Date: 09/10/2020 Lora Havens, MD     09/10/2020 12:43 PM Patient Name: Vonn Sliger Cornforth MRN: [id number] Epilepsy Attending: Lora Havens Referring Physician/Provider: Dr Bonnielee Haff Date: 09/10/2020 Duration: 25.30 mins Patient history: 75 year old male with altered mental status.  EEG to evaluate for seizures.  EEG Level of alertness: Awake AEDs during EEG study: None Technical aspects: This EEG study was done with scalp electrodes positioned according to the 10-20 International system of electrode placement. Electrical activity was acquired at a sampling rate of 500Hz  and reviewed with a high frequency filter of 70Hz  and a low frequency filter of 1Hz . EEG data were recorded continuously and digitally stored. Description: The posterior dominant rhythm consists of 7.5 Hz activity of moderate voltage (25-35 uV) seen predominantly in posterior head regions, symmetric and reactive to eye opening and eye closing. EEG showed intermittent generalized 3 to 6 Hz theta-delta slowing. Hyperventilation and photic stimulation were not performed.   ABNORMALITY -Intermittent slow, generalized IMPRESSION: This study is suggestive of mild diffuse encephalopathy, nonspecific etiology. No seizures or epileptiform discharges were seen throughout the recording. Lora Havens   ECHOCARDIOGRAM COMPLETE  Result Date: 09/10/2020    ECHOCARDIOGRAM REPORT   Patient Name:   TREYSHAWN MULDREW Date of Exam: 09/09/2020 Medical Rec #:  [id number]          Height:       70.0 in Accession #:    [id number]         Weight:       157.0 lb Date of Birth:  1946-03-26          BSA:          1.883 m Patient Age:    21 years           BP:           103/61 mmHg Patient Gender: M  HR:           75  bpm. Exam Location:  Inpatient Procedure: 2D Echo, Cardiac Doppler and Color Doppler Indications:    Stroke  History:        Patient has prior history of Echocardiogram examinations, most                 recent 08/06/2020. Prior CABG, Arrythmias:Atrial Fibrillation;                 Risk Factors:Hypertension and Sleep Apnea. DM.  Sonographer:    Clayton Lefort RDCS (AE) Referring Phys: [id number] Orlean Bradford [place]  1. Left ventricular ejection fraction, by estimation, is 40%. LVEF is depressed with akinesis of the inferior/inferoseptal base with aneurysmal dilitation. There is also akinesis of the apex. Compared to echo from January 2022, the apical thrombus is no  longer present. The diastolic parameters are consistent with Grade I diastolic dysfunction (impaired relaxation).  2. Right ventricular systolic function is normal. The right ventricular size is normal.  3. Left atrial size was mildly dilated.  4. The mitral valve is abnormal. Mild mitral valve regurgitation.  5. The aortic valve is normal in structure. Aortic valve regurgitation is mild. FINDINGS  Left Ventricle: LVEF is depressed with akinesis of the inferior/inferoseptal base with aneurysmal dilitatiaon. There is also akinesis of the apex. COmpared to echo from January the apical thrombus is no longer present. Left ventricular ejection fraction, by estimation, is 40%%. The left ventricle has mildly decreased function. The left ventricle demonstrates regional wall motion abnormalities. The left ventricular internal cavity size was normal in size. There is moderate left ventricular hypertrophy. Left ventricular diastolic parameters are consistent with Grade I diastolic dysfunction (impaired relaxation). Right Ventricle: The right ventricular size is normal. Right vetricular wall thickness was not assessed. Right ventricular systolic function is normal. Left Atrium: Left atrial size was mildly dilated. Right Atrium: Right atrial size was normal in  size. Pericardium: There is no evidence of pericardial effusion. Mitral Valve: The mitral valve is abnormal. There is mild thickening of the mitral valve leaflet(s). Mild mitral valve regurgitation. MV peak gradient, 3.6 mmHg. The mean mitral valve gradient is 1.0 mmHg. Tricuspid Valve: The tricuspid valve is normal in structure. Tricuspid valve regurgitation is mild. Aortic Valve: The aortic valve is normal in structure. Aortic valve regurgitation is mild. Aortic regurgitation PHT measures 993 msec. Aortic valve mean gradient measures 5.0 mmHg. Aortic valve peak gradient measures 8.1 mmHg. Aortic valve area, by VTI measures 2.48 cm. Pulmonic Valve: The pulmonic valve was normal in structure. Pulmonic valve regurgitation is not visualized. Aorta: The aortic root is normal in size and structure. IAS/Shunts: The interatrial septum was not assessed.  LEFT VENTRICLE PLAX 2D LVIDd:         4.90 cm      Diastology LVIDs:         3.20 cm      LV e' medial:    4.68 cm/s LV PW:         1.60 cm      LV E/e' medial:  15.6 LV IVS:        1.60 cm      LV e' lateral:   7.83 cm/s LVOT diam:     2.00 cm      LV E/e' lateral: 9.3 LV SV:         73 LV SV Index:   39 LVOT Area:     3.14 cm  3D Volume EF: LV Volumes (MOD)            3D EF:        44 % LV vol d, MOD A2C: 98.9 ml  LV EDV:       198 ml LV vol d, MOD A4C: 143.0 ml LV ESV:       112 ml LV vol s, MOD A2C: 79.4 ml  LV SV:        86 ml LV vol s, MOD A4C: 78.3 ml LV SV MOD A2C:     19.5 ml LV SV MOD A4C:     143.0 ml LV SV MOD BP:      39.6 ml RIGHT VENTRICLE            IVC RV Basal diam:  2.20 cm    IVC diam: 1.20 cm RV S prime:     7.73 cm/s TAPSE (M-mode): 1.1 cm LEFT ATRIUM           Index       RIGHT ATRIUM           Index LA diam:      3.50 cm 1.86 cm/m  RA Area:     19.30 cm LA Vol (A2C): 67.4 ml 35.79 ml/m RA Volume:   50.00 ml  26.55 ml/m LA Vol (A4C): 69.2 ml 36.74 ml/m  AORTIC VALVE AV Area (Vmax):    2.35 cm AV Area (Vmean):    2.27 cm AV Area (VTI):     2.48 cm AV Vmax:           142.00 cm/s AV Vmean:          98.300 cm/s AV VTI:            0.293 m AV Peak Grad:      8.1 mmHg AV Mean Grad:      5.0 mmHg LVOT Vmax:         106.00 cm/s LVOT Vmean:        71.000 cm/s LVOT VTI:          0.231 m LVOT/AV VTI ratio: 0.79 AI PHT:            993 msec  AORTA Ao Root diam: 3.80 cm Ao Asc diam:  3.90 cm MITRAL VALVE MV Area (PHT): 3.48 cm    SHUNTS MV Area VTI:   2.55 cm    Systemic VTI:  0.23 m MV Peak grad:  3.6 mmHg    Systemic Diam: 2.00 cm MV Mean grad:  1.0 mmHg MV Vmax:       0.94 m/s MV Vmean:      44.6 cm/s MV Decel Time: 218 msec MV E velocity: 73.20 cm/s MV A velocity: 88.30 cm/s MV E/A ratio:  0.83 Dorris Carnes MD Electronically signed by Dorris Carnes MD Signature Date/Time: 09/10/2020/1:59:27 PM    Final    CT Renal Stone Study  Result Date: 09/09/2020 CLINICAL DATA:  Flank pain. EXAM: CT ABDOMEN AND PELVIS WITHOUT CONTRAST TECHNIQUE: Multidetector CT imaging of the abdomen and pelvis was performed following the standard protocol without IV contrast. COMPARISON:  August 24, 2020 FINDINGS: Lower chest: Sternal wires are present. Very mild linear atelectasis is noted within the bilateral lung bases. Hepatobiliary: No focal liver abnormality is seen. Multiple tiny gallstones are seen within the partially contracted gallbladder. Pancreas: Unremarkable. No pancreatic ductal dilatation or surrounding inflammatory changes. Spleen: Normal in size without focal abnormality. Adrenals/Urinary Tract: Adrenal glands are unremarkable. Kidneys are  normal in size. Multiple bilateral parapelvic renal cysts are seen a 3 mm nonobstructing renal stone is seen within the posterior aspect of the mid left kidney. Bladder is unremarkable. Stomach/Bowel: Stomach is within normal limits. Appendix appears normal. No evidence of bowel wall thickening, distention, or inflammatory changes. Noninflamed diverticula are seen within the sigmoid colon.  Vascular/Lymphatic: Aortic atherosclerosis. No enlarged abdominal or pelvic lymph nodes. Reproductive: The prostate gland is mildly enlarged. Other: There is a 2.5 cm x 2.7 cm fat containing umbilical hernia. No abdominopelvic ascites. Musculoskeletal: Marked severity degenerative changes seen at the level of L4-L5. IMPRESSION: 1. Cholelithiasis. 2. 3 mm nonobstructing renal stone within the left kidney. 3. Multiple bilateral parapelvic renal cysts. 4. Sigmoid diverticulosis. 5. Fat-containing umbilical hernia. 6. Marked severity degenerative changes at the level of L4-L5. 7. Aortic atherosclerosis. Aortic Atherosclerosis (ICD10-I70.0). Electronically Signed   By: Virgina Norfolk M.D.   On: 09/09/2020 02:12    DISCHARGE EXAMINATION: Vitals:   09/11/20 0414 09/11/20 0744 09/11/20 0850 09/11/20 0945  BP: (!) 99/59 (!) 96/57 (!) 93/54 (!) 104/54  Pulse: 100 89 98   Resp: 18 18    Temp: 99.5 F (37.5 C) 98.4 F (36.9 C)    TempSrc: Oral Oral    SpO2: 92% 94%    Weight:      Height:       General appearance: Awake alert.  In no distress Resp: Clear to auscultation bilaterally.  Normal effort Cardio: S1-S2 is normal regular.  No S3-S4.  No rubs murmurs or bruit GI: Abdomen is soft.  Nontender nondistended.  Bowel sounds are present normal.  No masses organomegaly   DISPOSITION: Home  Discharge Instructions    AMB Referral to [facility] Management   Complete by: As directed    [facility] Medicare patient: Readmission  Please assign to [place] Coordinator for complex care and disease management follow up calls and assess for further needs.  Questions please call:   Natividad Brood, RN BSN [facility] Liaison  [phone] business mobile phone Toll free office [phone]  Fax number: [phone] [email or web address]   Reason for consult: High risk unplanned readmission less than 30 day   Diagnoses of:   Diabetes Other     Other Diagnosis: Encephalopathy   Expected date of contact: 1-3 days (reserved for hospital discharges)   Ambulatory referral to Neurology   Complete by: As directed    Follow up with Dr. Leonie Man at [facility] in 4-6 weeks. Too complicated for NP to follow. Thanks.   Call MD for:  difficulty breathing, headache or visual disturbances   Complete by: As directed    Call MD for:  extreme fatigue   Complete by: As directed    Call MD for:  persistant dizziness or light-headedness   Complete by: As directed    Call MD for:  persistant nausea and vomiting   Complete by: As directed    Call MD for:  severe uncontrolled pain   Complete by: As directed    Call MD for:  temperature >100.4   Complete by: As directed    Diet - low sodium heart healthy   Complete by: As directed    Diet Carb Modified   Complete by: As directed    Discharge instructions   Complete by: As directed    Please take your medications as prescribed.  Please talk to your primary care provider about referring you to a local gastroenterologist.  Follow-up with your cardiologist.  Referral has been sent to a neurologist.  You were cared for by a hospitalist during your hospital stay. If you have any questions about your discharge medications or the care you received while you were in the hospital after you are discharged, you can call the unit and asked to speak with the hospitalist on call if the hospitalist that took care of you is not available. Once you are discharged, your primary care physician will handle any further medical issues. Please note that NO REFILLS for any discharge medications will be authorized once you are discharged, as it is imperative that you return to your primary care physician (or establish a relationship with a primary care physician if you do not have one) for your aftercare needs so that they can reassess your need for medications and monitor your lab values. If you do not have a primary  care physician, you can call [phone] for a physician referral.   Increase activity slowly   Complete by: As directed         Allergies as of 09/11/2020      Reactions   Lipitor [atorvastatin] Other (See Comments)   Muscle pain   Sitagliptin    Other reaction(s): myalgias      Medication List    STOP taking these medications   metroNIDAZOLE 500 MG tablet Commonly known as: FLAGYL   predniSONE 20 MG tablet Commonly known as: DELTASONE   warfarin 2 MG tablet Commonly known as: COUMADIN     TAKE these medications   acetaminophen 325 MG tablet Commonly known as: TYLENOL Take 2 tablets (650 mg total) by mouth every 6 (six) hours as needed for mild pain or headache (or Fever >/= 101).   apixaban 5 MG Tabs tablet Commonly known as: ELIQUIS Take 1 tablet (5 mg total) by mouth 2 (two) times daily.   aspirin 81 MG EC tablet Take 1 tablet (81 mg total) by mouth daily. Swallow whole. Start taking on: September 12, 2020 What changed:   medication strength  how much to take  additional instructions   BD Pen Needle Nano U/F 32G X 4 MM Misc Generic drug: Insulin Pen Needle 1 each by Other route daily.   Empagliflozin-metFORMIN HCl 11-998 MG Tabs Take 1 tablet by mouth 2 (two) times daily.   fenofibrate 160 MG tablet Take 160 mg by mouth daily.   finasteride 5 MG tablet Commonly known as: PROSCAR Take 5 mg by mouth daily.   furosemide 40 MG tablet Commonly known as: Lasix TAKE 1 TABLET DAILY AS NEEDED FOR WEIGHT GAIN OF 2-3LBS IN 24 HOURS What changed:   how much to take  how to take this  when to take this   HumaLOG KwikPen 100 UNIT/ML KwikPen Generic drug: insulin lispro Inject 5-8 Units into the skin with breakfast, with lunch, and with evening meal.   hyoscyamine 0.125 MG Tbdp disintergrating tablet Commonly known as: ANASPAZ Place 0.125 mg under the tongue every 6 (six) hours as needed for cramping.   hyoscyamine 0.375 MG 12 hr tablet Commonly  known as: LEVBID Take 0.375 mg by mouth at bedtime.   loperamide 2 MG capsule Commonly known as: IMODIUM Take 1 capsule (2 mg total) by mouth 4 (four) times daily as needed for up to 5 doses for diarrhea or loose stools.   metoprolol tartrate 25 MG tablet Commonly known as: LOPRESSOR Take 0.5 tablets (12.5 mg total) by mouth 2 (two) times daily. What changed: how much to take  OneTouch Verio test strip Generic drug: glucose blood 1 each by Other route in the morning, at noon, and at bedtime.   rosuvastatin 20 MG tablet Commonly known as: CRESTOR Take 1 tablet (20 mg total) by mouth daily.   tamsulosin 0.4 MG Caps capsule Commonly known as: FLOMAX Take 1 capsule (0.4 mg total) by mouth daily. What changed: when to take this   thiamine 100 MG tablet Take 1 tablet (100 mg total) by mouth daily. Start taking on: September 12, 2020   Toujeo SoloStar 300 UNIT/ML Solostar Pen Generic drug: insulin glargine (1 Unit Dial) Inject 20 Units into the skin daily.   traMADol 50 MG tablet Commonly known as: ULTRAM Take 1 tablet (50 mg total) by mouth every 6 (six) hours as needed for moderate pain.         Follow-up Information    Garvin Fila, MD. Schedule an appointment as soon as possible for a visit in 4 week(s).   Specialties: Neurology, Radiology Contact information: [address] [place] [address] [phone]        Burnard Bunting, MD. Schedule an appointment as soon as possible for a visit in 1 week(s).   Specialty: Internal Medicine Contact information: [address] [place] [address] [phone]               TOTAL DISCHARGE TIME: 93 minutes  [place]  [facility] Pager on [email or web address]  09/11/2020, 1:49 PM

## 2020-09-11 NOTE — Patient Instructions (Signed)
Medication Instructions:  Your physician recommends that you continue on your current medications as directed. Please refer to the Current Medication list given to you today.  *If you need a refill on your cardiac medications before your next appointment, please call your pharmacy*   Lab Work: NONE   If you have labs (blood work) drawn today and your tests are completely normal, you will receive your results only by: Marland Kitchen MyChart Message (if you have MyChart) OR . A paper copy in the mail If you have any lab test that is abnormal or we need to change your treatment, we will call you to review the results.   Testing/Procedures: NONE    Follow-Up: At [facility], you and your health needs are our priority.  As part of our continuing mission to provide you with exceptional heart care, we have created designated Provider Care Teams.  These Care Teams include your primary Cardiologist (physician) and Advanced Practice Providers (APPs -  Physician Assistants and Nurse Practitioners) who all work together to provide you with the care you need, when you need it.  We recommend signing up for the patient portal called "MyChart".  Sign up information is provided on this After Visit Summary.  MyChart is used to connect with patients for Virtual Visits (Telemedicine).  Patients are able to view lab/test results, encounter notes, upcoming appointments, etc.  Non-urgent messages can be sent to your provider as well.   To learn more about what you can do with MyChart, go to [email or web address].    Your next appointment:    Next available   The format for your next appointment:   In Person  Provider:   Rozann Lesches, MD   Other Instructions Thank you for choosing [facility]!

## 2020-09-11 NOTE — Progress Notes (Signed)
Patient discharged home with wife.  Discharge instructions explained to patient and wife, both verbalize understanding and denied any questions.

## 2020-09-11 NOTE — Consult Note (Signed)
   [facility] Inpatient Consult   09/11/2020  Daimion Adamcik Monsour Mar 24, 1946 [id number]   [facility] [ACO] Patient: [employer] Medicare  High risk for unplanned readmission  PCP: Dr. Burnard Bunting, this office is listed to provide the TOC.  Assessed for readmission and for post hospital follow up.  Plan:  Assigned for high risk follow up  Natividad Brood, RN BSN [facility] Liaison  [phone] business mobile phone Toll free office [phone]  Fax number: [phone] [email or web address]

## 2020-09-11 NOTE — TOC Transition Note (Signed)
Transition of Care [facility]) - CM/SW Discharge Note   Patient Details  Name: Patrick Miranda MRN: [id number] Date of Birth: Nov 14, 1945  [facility] CM/SW Contact:  Pollie Friar, RN Phone Number: 09/11/2020, 10:12 AM   Clinical Narrative:    Patient is discharging home with [facility] services resumed through [facility]. Pt was active with them for [facility] PT//RN. [place] with [facility] aware of d/c.  Recommendations for 3 in 1. Pts spouse states he has one at home. CM provided wife with 30 day free coupon for Eliquis.  Wife to provide transport home.   Final next level of care: Home w Home Health Services Barriers to Discharge: No Barriers Identified   Patient Goals and CMS Choice     Choice offered to / list presented to : Spouse  Discharge Placement                       Discharge Plan and Services                                     Social Determinants of Health (SDOH) Interventions     Readmission Risk Interventions No flowsheet data found.

## 2020-09-11 NOTE — Progress Notes (Signed)
OT Cancellation Note  Patient Details Name: Kameryn Tisdel Manring MRN: [id number] DOB: 09/23/1945   Cancelled Treatment:    Reason Eval/Treat Not Completed: Other (comment) attempted OT evaluation but patient in transport chair being discharged from hospital. Please defer to PT note for recommendations.   Gloris Manchester OTR/L Supplemental OT, Department of rehab services [phone]  Penda Venturi R H. 09/11/2020, 10:42 AM

## 2020-09-12 ENCOUNTER — Other Ambulatory Visit: Payer: Self-pay

## 2020-09-12 DIAGNOSIS — R2689 Other abnormalities of gait and mobility: Secondary | ICD-10-CM | POA: Diagnosis not present

## 2020-09-12 DIAGNOSIS — K660 Peritoneal adhesions (postprocedural) (postinfection): Secondary | ICD-10-CM | POA: Diagnosis not present

## 2020-09-12 DIAGNOSIS — I4891 Unspecified atrial fibrillation: Secondary | ICD-10-CM | POA: Diagnosis not present

## 2020-09-12 DIAGNOSIS — Z9889 Other specified postprocedural states: Secondary | ICD-10-CM | POA: Diagnosis not present

## 2020-09-12 DIAGNOSIS — R42 Dizziness and giddiness: Secondary | ICD-10-CM | POA: Diagnosis not present

## 2020-09-12 DIAGNOSIS — Z9049 Acquired absence of other specified parts of digestive tract: Secondary | ICD-10-CM | POA: Diagnosis not present

## 2020-09-12 DIAGNOSIS — I340 Nonrheumatic mitral (valve) insufficiency: Secondary | ICD-10-CM | POA: Diagnosis not present

## 2020-09-12 DIAGNOSIS — Z743 Need for continuous supervision: Secondary | ICD-10-CM | POA: Diagnosis not present

## 2020-09-12 DIAGNOSIS — I509 Heart failure, unspecified: Secondary | ICD-10-CM | POA: Diagnosis not present

## 2020-09-12 DIAGNOSIS — K9189 Other postprocedural complications and disorders of digestive system: Secondary | ICD-10-CM | POA: Diagnosis not present

## 2020-09-12 DIAGNOSIS — F32A Depression, unspecified: Secondary | ICD-10-CM | POA: Diagnosis not present

## 2020-09-12 DIAGNOSIS — R000 Tachycardia, unspecified: Secondary | ICD-10-CM | POA: Diagnosis not present

## 2020-09-12 DIAGNOSIS — R1084 Generalized abdominal pain: Secondary | ICD-10-CM | POA: Diagnosis not present

## 2020-09-12 DIAGNOSIS — I129 Hypertensive chronic kidney disease with stage 1 through stage 4 chronic kidney disease, or unspecified chronic kidney disease: Secondary | ICD-10-CM | POA: Diagnosis not present

## 2020-09-12 DIAGNOSIS — R188 Other ascites: Secondary | ICD-10-CM | POA: Diagnosis not present

## 2020-09-12 DIAGNOSIS — K625 Hemorrhage of anus and rectum: Secondary | ICD-10-CM | POA: Diagnosis not present

## 2020-09-12 DIAGNOSIS — I959 Hypotension, unspecified: Secondary | ICD-10-CM | POA: Diagnosis not present

## 2020-09-12 DIAGNOSIS — Z86711 Personal history of pulmonary embolism: Secondary | ICD-10-CM | POA: Diagnosis not present

## 2020-09-12 DIAGNOSIS — J952 Acute pulmonary insufficiency following nonthoracic surgery: Secondary | ICD-10-CM | POA: Diagnosis not present

## 2020-09-12 DIAGNOSIS — F419 Anxiety disorder, unspecified: Secondary | ICD-10-CM | POA: Diagnosis not present

## 2020-09-12 DIAGNOSIS — K631 Perforation of intestine (nontraumatic): Secondary | ICD-10-CM | POA: Diagnosis not present

## 2020-09-12 DIAGNOSIS — E785 Hyperlipidemia, unspecified: Secondary | ICD-10-CM | POA: Diagnosis not present

## 2020-09-12 DIAGNOSIS — Z4659 Encounter for fitting and adjustment of other gastrointestinal appliance and device: Secondary | ICD-10-CM | POA: Diagnosis not present

## 2020-09-12 DIAGNOSIS — I130 Hypertensive heart and chronic kidney disease with heart failure and stage 1 through stage 4 chronic kidney disease, or unspecified chronic kidney disease: Secondary | ICD-10-CM | POA: Diagnosis not present

## 2020-09-12 DIAGNOSIS — F29 Unspecified psychosis not due to a substance or known physiological condition: Secondary | ICD-10-CM | POA: Diagnosis not present

## 2020-09-12 DIAGNOSIS — E1165 Type 2 diabetes mellitus with hyperglycemia: Secondary | ICD-10-CM | POA: Diagnosis not present

## 2020-09-12 DIAGNOSIS — R41841 Cognitive communication deficit: Secondary | ICD-10-CM | POA: Diagnosis not present

## 2020-09-12 DIAGNOSIS — K420 Umbilical hernia with obstruction, without gangrene: Secondary | ICD-10-CM | POA: Diagnosis not present

## 2020-09-12 DIAGNOSIS — I517 Cardiomegaly: Secondary | ICD-10-CM | POA: Diagnosis not present

## 2020-09-12 DIAGNOSIS — I493 Ventricular premature depolarization: Secondary | ICD-10-CM | POA: Diagnosis not present

## 2020-09-12 DIAGNOSIS — Z20822 Contact with and (suspected) exposure to covid-19: Secondary | ICD-10-CM | POA: Diagnosis not present

## 2020-09-12 DIAGNOSIS — K921 Melena: Secondary | ICD-10-CM | POA: Diagnosis not present

## 2020-09-12 DIAGNOSIS — I10 Essential (primary) hypertension: Secondary | ICD-10-CM | POA: Diagnosis not present

## 2020-09-12 DIAGNOSIS — E1122 Type 2 diabetes mellitus with diabetic chronic kidney disease: Secondary | ICD-10-CM | POA: Diagnosis not present

## 2020-09-12 DIAGNOSIS — N183 Chronic kidney disease, stage 3 unspecified: Secondary | ICD-10-CM | POA: Diagnosis not present

## 2020-09-12 DIAGNOSIS — D62 Acute posthemorrhagic anemia: Secondary | ICD-10-CM | POA: Diagnosis not present

## 2020-09-12 DIAGNOSIS — I253 Aneurysm of heart: Secondary | ICD-10-CM | POA: Diagnosis not present

## 2020-09-12 DIAGNOSIS — Z794 Long term (current) use of insulin: Secondary | ICD-10-CM | POA: Diagnosis not present

## 2020-09-12 DIAGNOSIS — K651 Peritoneal abscess: Secondary | ICD-10-CM | POA: Diagnosis not present

## 2020-09-12 DIAGNOSIS — E119 Type 2 diabetes mellitus without complications: Secondary | ICD-10-CM | POA: Diagnosis not present

## 2020-09-12 DIAGNOSIS — R52 Pain, unspecified: Secondary | ICD-10-CM | POA: Diagnosis not present

## 2020-09-12 DIAGNOSIS — Z7901 Long term (current) use of anticoagulants: Secondary | ICD-10-CM | POA: Diagnosis not present

## 2020-09-12 DIAGNOSIS — M6281 Muscle weakness (generalized): Secondary | ICD-10-CM | POA: Diagnosis not present

## 2020-09-12 DIAGNOSIS — Z48815 Encounter for surgical aftercare following surgery on the digestive system: Secondary | ICD-10-CM | POA: Diagnosis not present

## 2020-09-12 NOTE — Patient Outreach (Signed)
[facility]) Care Management  09/12/2020  Garvin Ellena Luten 19-Dec-1945 [id number]   Referral Date: 09/11/20 Referral Source: Hospital Liaison Referral Reason: Recent Hospitalization   Outreach Attempt: Telephone call to patient. Spoke with wife [name]. She states now is not a good time as patient physician wants him to go to [facility] for possible admit. Advised that CM would watch hospitalization and call when and if appropriate.      Plan: RN CM will watch for possible hospitalization and outreach when/if appropriate.     Jone Baseman, RN, MSN [facility] Care Management Care Management Coordinator Direct Line [phone] Toll Free: [phone]  Fax: [phone]

## 2020-09-13 DIAGNOSIS — F0390 Unspecified dementia without behavioral disturbance: Secondary | ICD-10-CM | POA: Insufficient documentation

## 2020-09-13 DIAGNOSIS — K420 Umbilical hernia with obstruction, without gangrene: Secondary | ICD-10-CM | POA: Insufficient documentation

## 2020-09-17 ENCOUNTER — Encounter (HOSPITAL_COMMUNITY): Payer: Medicare HMO

## 2020-09-17 ENCOUNTER — Telehealth: Payer: Self-pay | Admitting: Cardiology

## 2020-09-17 NOTE — Telephone Encounter (Signed)
Thank you for letting me know.  I hope that he continues to recuperate during his hospital stay.  Hopefully, I will be able to review records in more detail when a discharge summary is available.

## 2020-09-17 NOTE — Telephone Encounter (Signed)
Patrick Miranda was advised by [facility] to call Dr. Domenic Polite and let him know husband had emergency surgery to repair a hole in his intestine. 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 He is currently still in the hospital and has appointment to follow up with Dr. Domenic Polite on 10/17/20.

## 2020-09-19 ENCOUNTER — Ambulatory Visit: Payer: Medicare Other | Admitting: Internal Medicine

## 2020-09-26 DIAGNOSIS — R2689 Other abnormalities of gait and mobility: Secondary | ICD-10-CM | POA: Diagnosis not present

## 2020-09-26 DIAGNOSIS — F29 Unspecified psychosis not due to a substance or known physiological condition: Secondary | ICD-10-CM | POA: Diagnosis not present

## 2020-09-26 DIAGNOSIS — K668 Other specified disorders of peritoneum: Secondary | ICD-10-CM | POA: Diagnosis not present

## 2020-09-26 DIAGNOSIS — Z48815 Encounter for surgical aftercare following surgery on the digestive system: Secondary | ICD-10-CM | POA: Diagnosis not present

## 2020-09-26 DIAGNOSIS — Z794 Long term (current) use of insulin: Secondary | ICD-10-CM | POA: Diagnosis not present

## 2020-09-26 DIAGNOSIS — R531 Weakness: Secondary | ICD-10-CM | POA: Diagnosis not present

## 2020-09-26 DIAGNOSIS — M6281 Muscle weakness (generalized): Secondary | ICD-10-CM | POA: Diagnosis not present

## 2020-09-26 DIAGNOSIS — Z7901 Long term (current) use of anticoagulants: Secondary | ICD-10-CM | POA: Diagnosis not present

## 2020-09-26 DIAGNOSIS — E119 Type 2 diabetes mellitus without complications: Secondary | ICD-10-CM | POA: Diagnosis not present

## 2020-09-26 DIAGNOSIS — Z951 Presence of aortocoronary bypass graft: Secondary | ICD-10-CM | POA: Diagnosis not present

## 2020-09-26 DIAGNOSIS — R41841 Cognitive communication deficit: Secondary | ICD-10-CM | POA: Diagnosis not present

## 2020-09-26 DIAGNOSIS — Z743 Need for continuous supervision: Secondary | ICD-10-CM | POA: Diagnosis not present

## 2020-09-26 DIAGNOSIS — I4891 Unspecified atrial fibrillation: Secondary | ICD-10-CM | POA: Diagnosis not present

## 2020-09-26 DIAGNOSIS — I2699 Other pulmonary embolism without acute cor pulmonale: Secondary | ICD-10-CM | POA: Diagnosis not present

## 2020-09-26 DIAGNOSIS — I509 Heart failure, unspecified: Secondary | ICD-10-CM | POA: Diagnosis not present

## 2020-09-27 ENCOUNTER — Other Ambulatory Visit: Payer: Self-pay

## 2020-09-27 DIAGNOSIS — R531 Weakness: Secondary | ICD-10-CM | POA: Diagnosis not present

## 2020-09-27 DIAGNOSIS — K668 Other specified disorders of peritoneum: Secondary | ICD-10-CM | POA: Diagnosis not present

## 2020-09-27 DIAGNOSIS — I4891 Unspecified atrial fibrillation: Secondary | ICD-10-CM | POA: Diagnosis not present

## 2020-09-27 DIAGNOSIS — I2699 Other pulmonary embolism without acute cor pulmonale: Secondary | ICD-10-CM | POA: Diagnosis not present

## 2020-09-27 NOTE — Patient Outreach (Signed)
[facility]) Care Management  09/27/2020  [place] [birth year] [id number]   Patient sent to [facility].    Plan: RN CM will close case.   Jone Baseman, RN, MSN [facility] Management Care Management Coordinator Direct Line [phone] Cell [phone] Toll Free: [phone]  Fax: [phone]

## 2020-09-30 ENCOUNTER — Other Ambulatory Visit: Payer: Self-pay | Admitting: Surgical

## 2020-10-01 ENCOUNTER — Encounter: Payer: Medicare HMO | Admitting: Cardiothoracic Surgery

## 2020-10-02 ENCOUNTER — Other Ambulatory Visit: Payer: Self-pay | Admitting: Surgery

## 2020-10-02 DIAGNOSIS — Z951 Presence of aortocoronary bypass graft: Secondary | ICD-10-CM

## 2020-10-03 ENCOUNTER — Encounter: Payer: Self-pay | Admitting: Surgery

## 2020-10-03 ENCOUNTER — Ambulatory Visit: Payer: Self-pay | Admitting: *Deleted

## 2020-10-03 ENCOUNTER — Other Ambulatory Visit: Payer: Self-pay

## 2020-10-03 ENCOUNTER — Ambulatory Visit (INDEPENDENT_AMBULATORY_CARE_PROVIDER_SITE_OTHER): Payer: Self-pay | Admitting: Surgery

## 2020-10-03 ENCOUNTER — Ambulatory Visit
Admission: RE | Admit: 2020-10-03 | Discharge: 2020-10-03 | Disposition: A | Discharge status: Home / Self Care | Payer: Medicare HMO | Source: Ambulatory Visit | Attending: Surgery | Admitting: Surgery

## 2020-10-03 VITALS — BP 88/49 | HR 96 | Resp 20 | Ht 70.0 in | Wt 138.0 lb

## 2020-10-03 DIAGNOSIS — Z951 Presence of aortocoronary bypass graft: Secondary | ICD-10-CM

## 2020-10-03 DIAGNOSIS — I2510 Atherosclerotic heart disease of native coronary artery without angina pectoris: Secondary | ICD-10-CM

## 2020-10-03 DIAGNOSIS — Z09 Encounter for follow-up examination after completed treatment for conditions other than malignant neoplasm: Secondary | ICD-10-CM

## 2020-10-03 NOTE — Progress Notes (Signed)
HPI:  The patient is a 75 year old gentleman who underwent coronary artery bypass graft surgery x3 by Dr. Darcey Nora on 07/09/2020.  Had history of diabetes, inferior MI, and admission for class III heart failure.  His intraoperative TEE showed an ejection fraction of 45 to 50% he developed postoperative atrial fibrillation on postoperative day 3 and was converted with amiodarone.  He was readmitted to [facility] on 08/03/2020 with nausea, vomiting, abdominal pain.  He was seen by cardiology and an echocardiogram showed probable LV apical thrombus measuring 1.5 x 1.5 cm.  His ejection fraction was 45% with a large basal inferior and inferoseptal aneurysm that was unchanged compared to his intraoperative TEE.  It was unclear what was causing his abdominal pain and gastroenteritis versus mesenteric ischemia was entertained.  He was sent home on Coumadin.  He was seen back by cardiology on 08/14/2020 complaining of abdominal pain and diarrhea.  He was referred to GI.  His INR on 08/16/2020 was up to 8 and Coumadin was held.  He was also noted to have peripheral edema and was started on Lasix.  He was seen in the emergency room after a fall on 08/24/2020 and a head CT at that time showed an age-indeterminate infarct in the left frontal region. He was readmitted on 09/09/2020 with altered mental status for couple weeks.  Repeat head CT showed evolution of the frontal infarct suggesting a subacute nature.  He was seen by neurology who felt he had a chronic left frontal and right parietal infarct either due to his coronary bypass surgery in December or paroxysmal atrial fibrillation or left ventricular thrombus.  He had an MRI showing areas of altered diffusion signal related to remote infarcts and chronic small vessel ischemia.  A follow-up 2D echo showed an ejection fraction of 40% with akinesis of the inferior and superior septal base with aneurysmal dilatation but the apical thrombus was no longer present.  He was  started on Eliquis due to unreliable anticoagulation with Coumadin.  He continued to have abdominal pain and was taken to the [facility] emergency room where he was diagnosed with a bowel perforation and underwent exploratory laparotomy and short segment bowel resection.  He was subsequently discharged to [facility] rehab where he has been for the past week.  His wife and son are with him today.  They report that he has lost a lot of weight since his heart surgery.  He has not been eating very well.  He has also been lethargic but reportedly doing well with his rehab therapies.  Current Outpatient Medications  Medication Sig Dispense Refill  . acetaminophen (TYLENOL) 325 MG tablet Take 2 tablets (650 mg total) by mouth every 6 (six) hours as needed for mild pain or headache (or Fever >/= 101).    Marland Kitchen apixaban (ELIQUIS) 5 MG TABS tablet Take 1 tablet (5 mg total) by mouth 2 (two) times daily. 60 tablet 2  . aspirin EC 81 MG EC tablet Take 1 tablet (81 mg total) by mouth daily. Swallow whole. 30 tablet 11  . BD PEN NEEDLE NANO U/F 32G X 4 MM MISC 1 each by Other route daily.     . Empagliflozin-metFORMIN HCl 11-998 MG TABS Take 1 tablet by mouth 2 (two) times daily.    . fenofibrate 160 MG tablet Take 160 mg by mouth daily.    . finasteride (PROSCAR) 5 MG tablet Take 5 mg by mouth daily.    . furosemide (LASIX) 40 MG  tablet TAKE 1 TABLET DAILY AS NEEDED FOR WEIGHT GAIN OF 2-3LBS IN 24 HOURS (Patient taking differently: Take 40 mg by mouth See admin instructions. TAKE 1 TABLET DAILY AS NEEDED FOR WEIGHT GAIN OF 2-3LBS IN 24 HOURS) 90 tablet 0  . HUMALOG KWIKPEN 100 UNIT/ML KwikPen Inject 5-8 Units into the skin with breakfast, with lunch, and with evening meal.    . hyoscyamine (ANASPAZ) 0.125 MG TBDP disintergrating tablet Place 0.125 mg under the tongue every 6 (six) hours as needed for cramping.    . hyoscyamine (LEVBID) 0.375 MG 12 hr tablet Take 0.375 mg by mouth at bedtime.    Marland Kitchen loperamide (IMODIUM)  2 MG capsule Take 1 capsule (2 mg total) by mouth 4 (four) times daily as needed for up to 5 doses for diarrhea or loose stools. 5 capsule 0  . metoprolol tartrate (LOPRESSOR) 25 MG tablet Take 0.5 tablets (12.5 mg total) by mouth 2 (two) times daily. 60 tablet 1  . ONETOUCH VERIO test strip 1 each by Other route in the morning, at noon, and at bedtime.   3  . rosuvastatin (CRESTOR) 20 MG tablet Take 1 tablet (20 mg total) by mouth daily. 30 tablet 1  . tamsulosin (FLOMAX) 0.4 MG CAPS capsule Take 1 capsule (0.4 mg total) by mouth daily. (Patient taking differently: Take 0.4 mg by mouth at bedtime.) 30 capsule 0  . thiamine 100 MG tablet Take 1 tablet (100 mg total) by mouth daily. 30 tablet 0  . TOUJEO SOLOSTAR 300 UNIT/ML Solostar Pen Inject 20 Units into the skin daily.    . traMADol (ULTRAM) 50 MG tablet Take 1 tablet (50 mg total) by mouth every 6 (six) hours as needed for moderate pain. 28 tablet 0   No current facility-administered medications for this visit.     Physical Exam: BP (!) 88/49   Pulse 96   Resp 20   Ht 5\' 10"  (1.778 m)   Wt 138 lb (62.6 kg)   SpO2 98% Comment: RA  BMI 19.80 kg/m  He is a thin, frail-appearing gentleman in no distress. Cardiac exam shows a regular rate and rhythm with normal heart sounds.  There is no murmur. Lungs are clear. The chest incision is well-healed and the sternum is stable. There are Steri-Strips over the abdominal incision which is healing well.  Abdomen is soft and nontender. Extremities exam shows no peripheral edema.  Diagnostic Tests:  CLINICAL DATA:  CABG  EXAM: CHEST - 2 VIEW  COMPARISON:  08/03/2020  FINDINGS: Heart size and vascularity normal. Lungs are now clear without infiltrate or effusion. Normal lung volumes.  IMPRESSION: No active cardiopulmonary disease.   Electronically Signed   By: Franchot Gallo M.D.   On: 10/03/2020 09:31   Impression:  He is almost 3 months following coronary bypass  graft surgery and having a slow recovery due to embolic strokes either related to his left ventricular apical thrombus or atrial fibrillation as well as recent bowel necrosis and perforation requiring surgery.  I doubt that his strokes are related to his coronary bypass surgery since he did well initially and was not admitted with altered mental status until February when the strokes were diagnosed.  He could still be having some mental status changes related to these particularly with frontal lobe stroke.  His blood pressure today is low and his wife reports that he has been lethargic.  I discontinued his Lopressor and changed his Lasix to as needed since he has no peripheral  edema.  Hopefully this will improve his blood pressure and lethargy.  I think he is healing up well following bypass surgery but has some continued challenges with his mental status and debilitation due to the past few months of not eating and weight loss probably related to intestinal ischemia and subsequent necrosis.  Luckily this sounds like it was a very limited amount of bowel involved.  Plan:  He has a follow-up appointment with Dr. Domenic Polite on 10/17/2020 and with neurology in the near future.  He will return to see me if he has any problems with his incisions.   Gaye Pollack, MD [facility] [phone]

## 2020-10-07 DIAGNOSIS — N1831 Chronic kidney disease, stage 3a: Secondary | ICD-10-CM | POA: Diagnosis not present

## 2020-10-07 DIAGNOSIS — I255 Ischemic cardiomyopathy: Secondary | ICD-10-CM | POA: Diagnosis not present

## 2020-10-07 DIAGNOSIS — E1165 Type 2 diabetes mellitus with hyperglycemia: Secondary | ICD-10-CM | POA: Diagnosis not present

## 2020-10-07 DIAGNOSIS — I130 Hypertensive heart and chronic kidney disease with heart failure and stage 1 through stage 4 chronic kidney disease, or unspecified chronic kidney disease: Secondary | ICD-10-CM | POA: Diagnosis not present

## 2020-10-07 DIAGNOSIS — E1122 Type 2 diabetes mellitus with diabetic chronic kidney disease: Secondary | ICD-10-CM | POA: Diagnosis not present

## 2020-10-07 DIAGNOSIS — I5022 Chronic systolic (congestive) heart failure: Secondary | ICD-10-CM | POA: Diagnosis not present

## 2020-10-07 DIAGNOSIS — I513 Intracardiac thrombosis, not elsewhere classified: Secondary | ICD-10-CM | POA: Diagnosis not present

## 2020-10-07 DIAGNOSIS — I252 Old myocardial infarction: Secondary | ICD-10-CM | POA: Diagnosis not present

## 2020-10-07 DIAGNOSIS — I2510 Atherosclerotic heart disease of native coronary artery without angina pectoris: Secondary | ICD-10-CM | POA: Diagnosis not present

## 2020-10-08 DIAGNOSIS — R3915 Urgency of urination: Secondary | ICD-10-CM | POA: Diagnosis not present

## 2020-10-08 DIAGNOSIS — R3912 Poor urinary stream: Secondary | ICD-10-CM | POA: Diagnosis not present

## 2020-10-08 DIAGNOSIS — N401 Enlarged prostate with lower urinary tract symptoms: Secondary | ICD-10-CM | POA: Diagnosis not present

## 2020-10-08 DIAGNOSIS — R3914 Feeling of incomplete bladder emptying: Secondary | ICD-10-CM | POA: Diagnosis not present

## 2020-10-09 ENCOUNTER — Encounter: Payer: Self-pay | Admitting: Physician Assistant

## 2020-10-09 DIAGNOSIS — R2689 Other abnormalities of gait and mobility: Secondary | ICD-10-CM | POA: Diagnosis not present

## 2020-10-09 DIAGNOSIS — N183 Chronic kidney disease, stage 3 unspecified: Secondary | ICD-10-CM | POA: Diagnosis not present

## 2020-10-09 DIAGNOSIS — I509 Heart failure, unspecified: Secondary | ICD-10-CM | POA: Diagnosis not present

## 2020-10-10 DIAGNOSIS — R531 Weakness: Secondary | ICD-10-CM | POA: Diagnosis not present

## 2020-10-10 DIAGNOSIS — D649 Anemia, unspecified: Secondary | ICD-10-CM | POA: Diagnosis not present

## 2020-10-10 DIAGNOSIS — K631 Perforation of intestine (nontraumatic): Secondary | ICD-10-CM | POA: Diagnosis not present

## 2020-10-10 DIAGNOSIS — E785 Hyperlipidemia, unspecified: Secondary | ICD-10-CM | POA: Diagnosis not present

## 2020-10-10 DIAGNOSIS — I129 Hypertensive chronic kidney disease with stage 1 through stage 4 chronic kidney disease, or unspecified chronic kidney disease: Secondary | ICD-10-CM | POA: Diagnosis not present

## 2020-10-10 DIAGNOSIS — I959 Hypotension, unspecified: Secondary | ICD-10-CM | POA: Diagnosis not present

## 2020-10-10 DIAGNOSIS — K668 Other specified disorders of peritoneum: Secondary | ICD-10-CM | POA: Diagnosis not present

## 2020-10-10 DIAGNOSIS — E1129 Type 2 diabetes mellitus with other diabetic kidney complication: Secondary | ICD-10-CM | POA: Diagnosis not present

## 2020-10-10 DIAGNOSIS — E440 Moderate protein-calorie malnutrition: Secondary | ICD-10-CM | POA: Diagnosis not present

## 2020-10-10 DIAGNOSIS — I4891 Unspecified atrial fibrillation: Secondary | ICD-10-CM | POA: Diagnosis not present

## 2020-10-11 DIAGNOSIS — Z09 Encounter for follow-up examination after completed treatment for conditions other than malignant neoplasm: Secondary | ICD-10-CM | POA: Diagnosis not present

## 2020-10-11 DIAGNOSIS — Z8719 Personal history of other diseases of the digestive system: Secondary | ICD-10-CM | POA: Diagnosis not present

## 2020-10-12 DIAGNOSIS — E1122 Type 2 diabetes mellitus with diabetic chronic kidney disease: Secondary | ICD-10-CM | POA: Diagnosis not present

## 2020-10-12 DIAGNOSIS — I130 Hypertensive heart and chronic kidney disease with heart failure and stage 1 through stage 4 chronic kidney disease, or unspecified chronic kidney disease: Secondary | ICD-10-CM | POA: Diagnosis not present

## 2020-10-12 DIAGNOSIS — I5022 Chronic systolic (congestive) heart failure: Secondary | ICD-10-CM | POA: Diagnosis not present

## 2020-10-12 DIAGNOSIS — I252 Old myocardial infarction: Secondary | ICD-10-CM | POA: Diagnosis not present

## 2020-10-12 DIAGNOSIS — I482 Chronic atrial fibrillation, unspecified: Secondary | ICD-10-CM | POA: Diagnosis not present

## 2020-10-12 DIAGNOSIS — I2510 Atherosclerotic heart disease of native coronary artery without angina pectoris: Secondary | ICD-10-CM | POA: Diagnosis not present

## 2020-10-12 DIAGNOSIS — Z48815 Encounter for surgical aftercare following surgery on the digestive system: Secondary | ICD-10-CM | POA: Diagnosis not present

## 2020-10-12 DIAGNOSIS — I255 Ischemic cardiomyopathy: Secondary | ICD-10-CM | POA: Diagnosis not present

## 2020-10-12 DIAGNOSIS — N1831 Chronic kidney disease, stage 3a: Secondary | ICD-10-CM | POA: Diagnosis not present

## 2020-10-15 DIAGNOSIS — I2510 Atherosclerotic heart disease of native coronary artery without angina pectoris: Secondary | ICD-10-CM | POA: Diagnosis not present

## 2020-10-15 DIAGNOSIS — I482 Chronic atrial fibrillation, unspecified: Secondary | ICD-10-CM | POA: Diagnosis not present

## 2020-10-15 DIAGNOSIS — I255 Ischemic cardiomyopathy: Secondary | ICD-10-CM | POA: Diagnosis not present

## 2020-10-15 DIAGNOSIS — I130 Hypertensive heart and chronic kidney disease with heart failure and stage 1 through stage 4 chronic kidney disease, or unspecified chronic kidney disease: Secondary | ICD-10-CM | POA: Diagnosis not present

## 2020-10-15 DIAGNOSIS — I5022 Chronic systolic (congestive) heart failure: Secondary | ICD-10-CM | POA: Diagnosis not present

## 2020-10-15 DIAGNOSIS — N1831 Chronic kidney disease, stage 3a: Secondary | ICD-10-CM | POA: Diagnosis not present

## 2020-10-15 DIAGNOSIS — I252 Old myocardial infarction: Secondary | ICD-10-CM | POA: Diagnosis not present

## 2020-10-15 DIAGNOSIS — E1122 Type 2 diabetes mellitus with diabetic chronic kidney disease: Secondary | ICD-10-CM | POA: Diagnosis not present

## 2020-10-15 DIAGNOSIS — Z48815 Encounter for surgical aftercare following surgery on the digestive system: Secondary | ICD-10-CM | POA: Diagnosis not present

## 2020-10-16 ENCOUNTER — Encounter: Payer: Self-pay | Admitting: Cardiology

## 2020-10-16 DIAGNOSIS — Z48815 Encounter for surgical aftercare following surgery on the digestive system: Secondary | ICD-10-CM | POA: Diagnosis not present

## 2020-10-16 DIAGNOSIS — I130 Hypertensive heart and chronic kidney disease with heart failure and stage 1 through stage 4 chronic kidney disease, or unspecified chronic kidney disease: Secondary | ICD-10-CM | POA: Diagnosis not present

## 2020-10-16 DIAGNOSIS — I482 Chronic atrial fibrillation, unspecified: Secondary | ICD-10-CM | POA: Diagnosis not present

## 2020-10-16 DIAGNOSIS — I5022 Chronic systolic (congestive) heart failure: Secondary | ICD-10-CM | POA: Diagnosis not present

## 2020-10-16 DIAGNOSIS — E1122 Type 2 diabetes mellitus with diabetic chronic kidney disease: Secondary | ICD-10-CM | POA: Diagnosis not present

## 2020-10-16 DIAGNOSIS — I252 Old myocardial infarction: Secondary | ICD-10-CM | POA: Diagnosis not present

## 2020-10-16 DIAGNOSIS — I2510 Atherosclerotic heart disease of native coronary artery without angina pectoris: Secondary | ICD-10-CM | POA: Diagnosis not present

## 2020-10-16 DIAGNOSIS — I255 Ischemic cardiomyopathy: Secondary | ICD-10-CM | POA: Diagnosis not present

## 2020-10-16 DIAGNOSIS — N1831 Chronic kidney disease, stage 3a: Secondary | ICD-10-CM | POA: Diagnosis not present

## 2020-10-16 NOTE — Progress Notes (Signed)
Cardiology Office Note  Date: 10/17/2020   ID: COLLEEN DONAHOE, DOB Sep 22, 1945, MRN [id number]  PCP:  Burnard Bunting, MD  Cardiologist:  Rozann Lesches, MD Electrophysiologist:  None   Chief Complaint  Patient presents with  . Cardiac follow-up    History of Present Illness: [place] is a medically complex 75 y.o. male last seen in February by Ms. Bonnell Public PA-C.  I reviewed the chart and updated myself on his history since our last encounter in December 2021.  He is here today with his wife and daughter for a follow-up visit.  Currently at home after his most recent hospitalization.  He has home PT/OT twice weekly.  Has been gradually improving in terms of appetite, gaining back some weight.  No obvious change in his stools.  He still has periods of fatigue and sleepiness.  No obvious angina symptoms or palpitations.  He saw Dr. Cyndia Bent recently.  Was taken off low-dose Lopressor at that time given relatively low blood pressure and fatigue.  Heart rate elevated today.  I personally reviewed his ECG which shows probable sinus tachycardia at 131 bpm, rightward axis, occasional fusion complexes and diffuse ST-T wave abnormalities.  Tracing is not consistent with atrial fibrillation, nor does it appear to be atrial flutter.   He was seen recently on March 24 at [facility] for follow-up of exploratory laparotomy with small bowel resection due to perforation and February.  He did require packed red cell transfusion during his stay following bowel surgery.  Last hemoglobin was 8.4.  I reviewed the remainder of his medications as outlined below.  For the present time he is on both aspirin and Eliquis.  Echocardiogram done at [facility] on February 24 indicated LVEF 45 to 50% range with aneurysmal/akinetic portion of the basal inferoseptal wall, no obvious thrombus.  Past Medical History:  Diagnosis Date  . Arthritis   . 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 Cerebral artery disease   .  Coronary artery disease   . COVID-07 Dec 2019  . Depression   . Essential hypertension   . Hernia, umbilical   . HOH (hard of hearing)   . Hyperlipidemia   . Ischemic cardiomyopathy   . Ischemic necrosis of small bowel ([place])   . Left ventricular mural thrombus   . NSTEMI (non-ST elevated myocardial infarction) ([place])   . OSA on CPAP   . Pulmonary emboli [facility])    May 2021  . Skin cancer   . Type 2 diabetes mellitus ([place])     Past Surgical History:  Procedure Laterality Date  . COLONOSCOPY W/ POLYPECTOMY    . CORONARY ARTERY BYPASS GRAFT N/A 07/09/2020   Procedure: CORONARY ARTERY BYPASS GRAFTING (CABG) TIMES THREE , USING LEFT INTERNAL MAMMARY ARTERY AND RIGHT LEG GREATER SAPHENOUS VEIN HARVESTED ENDOSCOPICALLY;  Surgeon: Ivin Poot, MD;  Location: [place];  Service: Open Heart Surgery;  Laterality: N/A;  . LEFT HEART CATH AND CORONARY ANGIOGRAPHY N/A 07/04/2020   Procedure: LEFT HEART CATH AND CORONARY ANGIOGRAPHY;  Surgeon: Troy Sine, MD;  Location: [place] CV LAB;  Service: Cardiovascular;  Laterality: N/A;  . TEE WITHOUT CARDIOVERSION N/A 07/09/2020   Procedure: TRANSESOPHAGEAL ECHOCARDIOGRAM (TEE);  Surgeon: Prescott Gum, Collier Salina, MD;  Location: [place];  Service: Open Heart Surgery;  Laterality: N/A;  . TOTAL KNEE ARTHROPLASTY Left 04/22/2013   Procedure: TOTAL KNEE ARTHROPLASTY- left;  Surgeon: Augustin Schooling, MD;  Location: [place];  Service: Orthopedics;  Laterality:  Left;  with femoral block  . TOTAL KNEE ARTHROPLASTY Right 01/06/2014   Procedure: RIGHT TOTAL KNEE ARTHROPLASTY;  Surgeon: Augustin Schooling, MD;  Location: [place];  Service: Orthopedics;  Laterality: Right;    Current Outpatient Medications  Medication Sig Dispense Refill  . acetaminophen (TYLENOL) 325 MG tablet Take 2 tablets (650 mg total) by mouth every 6 (six) hours as needed for mild pain or headache (or Fever >/= 101).    Marland Kitchen apixaban (ELIQUIS) 5 MG TABS tablet Take 1 tablet (5 mg total) by mouth 2  (two) times daily. 60 tablet 2  . aspirin EC 81 MG EC tablet Take 1 tablet (81 mg total) by mouth daily. Swallow whole. 30 tablet 11  . BD PEN NEEDLE NANO U/F 32G X 4 MM MISC 1 each by Other route daily.     . fenofibrate 160 MG tablet Take 160 mg by mouth daily.    . finasteride (PROSCAR) 5 MG tablet Take 5 mg by mouth daily.    Marland Kitchen HUMALOG KWIKPEN 100 UNIT/ML KwikPen Inject 5-8 Units into the skin with breakfast, with lunch, and with evening meal.    . metFORMIN (GLUCOPHAGE) 500 MG tablet 1 tablet twice daily before breakfast and evening meal    . metoprolol succinate (TOPROL XL) 25 MG 24 hr tablet Take 1 tablet (25 mg total) by mouth daily after supper. 90 tablet 3  . ONETOUCH VERIO test strip 1 each by Other route in the morning, at noon, and at bedtime.   3  . rosuvastatin (CRESTOR) 20 MG tablet Take 1 tablet (20 mg total) by mouth daily. 30 tablet 1  . tamsulosin (FLOMAX) 0.4 MG CAPS capsule Take 1 capsule (0.4 mg total) by mouth daily. (Patient taking differently: Take 0.4 mg by mouth at bedtime.) 30 capsule 0  . TOUJEO SOLOSTAR 300 UNIT/ML Solostar Pen Inject 20 Units into the skin daily.    . hyoscyamine (LEVBID) 0.375 MG 12 hr tablet Take 0.375 mg by mouth at bedtime.     No current facility-administered medications for this visit.   Allergies:  Lipitor [atorvastatin] and Sitagliptin   Social History: The patient  reports that he has never smoked. He has never used smokeless tobacco. He reports that he does not drink alcohol and does not use drugs.   Family History: The patient's family history includes CAD in his brother.   ROS: No syncope.  No orthopnea or PND.  No nausea or emesis.  Physical Exam: VS:  BP 112/64   Pulse (!) 124   Ht 5' 10.5" (1.791 m)   Wt 145 lb (65.8 kg)   SpO2 98%   BMI 20.51 kg/m , BMI Body mass index is 20.51 kg/m.  Wt Readings from Last 3 Encounters:  10/17/20 145 lb (65.8 kg)  10/03/20 138 lb (62.6 kg)  09/11/20 153 lb 12.8 oz (69.8 kg)     General: Elderly male, appears comfortable.   HEENT: Conjunctiva and lids normal, wearing a mask. Neck: Supple, no elevated JVP or carotid bruits, no thyromegaly. Lungs: Clear to auscultation, nonlabored breathing at rest. Cardiac: Rapid regular rate, no obvious gallop or rub.  No significant murmur. Abdomen: Soft, nontender, bowel sounds present. Extremities: No pitting edema, distal pulses 2+. Skin: Warm and dry.  Pale. Musculoskeletal: No kyphosis. Neuropsychiatric: Alert and oriented x3, affect grossly appropriate.  ECG:  An ECG dated 09/09/2020 was personally reviewed today and demonstrated:  Sinus rhythm with incomplete left bundle branch block and repolarization abnormalities.  Recent  Labwork: 07/05/2020: TSH 2.525 08/04/2020: B Natriuretic Peptide 656.9 08/10/2020: Magnesium 1.9 09/11/2020: ALT 19; AST 34; BUN 18; Creatinine, Ser 1.21; Hemoglobin 8.9; Platelets 673; Potassium 4.2; Sodium 137     Component Value Date/Time   CHOL 65 09/09/2020 0402   TRIG 94 09/09/2020 0402   HDL 21 (L) 09/09/2020 0402   CHOLHDL 3.1 09/09/2020 0402   VLDL 19 09/09/2020 0402   LDLCALC 25 09/09/2020 0402  February 2022: AST 20, ALT 29 September 2020: Potassium 4.1, BUN 8, creatinine 0.66, Hgb 8.4  Other Studies Reviewed Today:  Echocardiogram 09/09/2020: 1. Left ventricular ejection fraction, by estimation, is 40%. LVEF is  depressed with akinesis of the inferior/inferoseptal base with aneurysmal  dilitation. There is also akinesis of the apex. Compared to echo from  January 2022, the apical thrombus is no  longer present. The diastolic parameters are consistent with Grade I  diastolic dysfunction (impaired relaxation).  2. Right ventricular systolic function is normal. The right ventricular  size is normal.  3. Left atrial size was mildly dilated.  4. The mitral valve is abnormal. Mild mitral valve regurgitation.  5. The aortic valve is normal in structure. Aortic valve regurgitation is   mild.   Echocardiogram 09/13/2020 [facility]): SUMMARY  Mild left ventricular hypertrophy  The left ventricular size is normal.  Left ventricular systolic function is mildly reduced.  LV ejection fraction = 45-50%.  The basal LV inferior wall is akinetic and aneurysmal. The base of the  inferoseptal wall also appears to be so. In the absence of coronary  artery disease, sarcoidosis can have such an appearance. Clinical  correlation is recommended.  The right ventricle is normal in size and function.  The left atrium is mildly dilated.  There is no significant valvular stenosis or regurgitation.  The ascendingaorta is normal size.  The inferior vena cava was not visualized during the exam.  There is no pericardial effusion.  There is no comparison study available.   Assessment and Plan:  1.  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  No active angina symptoms at this time.  He is on aspirin and Crestor.  2.  Ischemic cardiomyopathy, LVEF approximately 40 to 50% based on recent echocardiograms done in February at different facilities.  He has akinesis/aneurysmal dilatation of the basal inferior/inferoseptal wall, no residual LV mural thrombus by most recent evaluation.  Plan to resume beta-blocker in the form of Toprol-XL 25 mg once in the evening, particularly with sinus tachycardia (taken off Lopressor recently).  I was concerned he may have been back in an atrial arrhythmia, but ECG does not suggest atrial flutter today.  Relatively low blood pressure at baseline precludes addition of further medicines for cardiomyopathy at this time.  We will continue to follow closely.  3.  History of LV mural thrombus, likely contributing to stroke and also bowel necrosis due to embolization.  He is on Eliquis at this time and most recently had no evidence of residual LV mural thrombus by echocardiogram.  4.  Postoperative atrial fibrillation following CABG, continue to observe for  recurrence.  5.  Recent bowel necrosis requiring exploratory laparotomy with small segment small bowel resection at [facility].  Reports improved abdominal pain and improving appetite.  Check CBC to ensure no decreasing hemoglobin, he did require PRBCs during his most recent hospital stay due to GI bleeding.  Medication Adjustments/Labs and Tests Ordered: Current medicines are reviewed at length with the patient today.  Concerns regarding medicines are outlined above.  Tests Ordered: Orders Placed This Encounter  Procedures  . CBC with Differential    Medication Changes: Meds ordered this encounter  Medications  . metoprolol succinate (TOPROL XL) 25 MG 24 hr tablet    Sig: Take 1 tablet (25 mg total) by mouth daily after supper.    Dispense:  90 tablet    Refill:  3    Disposition:  Follow up 2 to 3 weeks the [place] office.  Signed, Satira Sark, MD, [facility] 10/17/2020 3:31 PM    [facility] at [facility] [address] [place], [place] [address] Phone: [phone]; Fax: [phone]

## 2020-10-17 ENCOUNTER — Other Ambulatory Visit (HOSPITAL_COMMUNITY)
Admission: RE | Admit: 2020-10-17 | Discharge: 2020-10-17 | Disposition: A | Discharge status: Home / Self Care | Payer: Medicare HMO | Source: Ambulatory Visit | Attending: Cardiology | Admitting: Cardiology

## 2020-10-17 ENCOUNTER — Ambulatory Visit: Payer: Medicare HMO | Admitting: Cardiology

## 2020-10-17 ENCOUNTER — Other Ambulatory Visit: Payer: Self-pay

## 2020-10-17 ENCOUNTER — Encounter: Payer: Self-pay | Admitting: Cardiology

## 2020-10-17 ENCOUNTER — Telehealth: Payer: Self-pay

## 2020-10-17 VITALS — BP 112/64 | HR 124 | Ht 70.5 in | Wt 145.0 lb

## 2020-10-17 DIAGNOSIS — Z8673 Personal history of transient ischemic attack (TIA), and cerebral infarction without residual deficits: Secondary | ICD-10-CM

## 2020-10-17 DIAGNOSIS — I9789 Other postprocedural complications and disorders of the circulatory system, not elsewhere classified: Secondary | ICD-10-CM

## 2020-10-17 DIAGNOSIS — I255 Ischemic cardiomyopathy: Secondary | ICD-10-CM

## 2020-10-17 DIAGNOSIS — I25119 Atherosclerotic heart disease of native coronary artery with unspecified angina pectoris: Secondary | ICD-10-CM | POA: Diagnosis not present

## 2020-10-17 DIAGNOSIS — Z79899 Other long term (current) drug therapy: Secondary | ICD-10-CM

## 2020-10-17 DIAGNOSIS — I513 Intracardiac thrombosis, not elsewhere classified: Secondary | ICD-10-CM | POA: Diagnosis not present

## 2020-10-17 DIAGNOSIS — I4891 Unspecified atrial fibrillation: Secondary | ICD-10-CM | POA: Diagnosis not present

## 2020-10-17 DIAGNOSIS — Z8719 Personal history of other diseases of the digestive system: Secondary | ICD-10-CM

## 2020-10-17 LAB — CBC WITH DIFFERENTIAL/PLATELET
Abs Immature Granulocytes: 0.05 10*3/uL (ref 0.00–0.07)
Basophils Absolute: 0.1 10*3/uL (ref 0.0–0.1)
Basophils Relative: 1 %
Eosinophils Absolute: 0.2 10*3/uL (ref 0.0–0.5)
Eosinophils Relative: 2 %
HCT: 36.4 % — ABNORMAL LOW (ref 39.0–52.0)
Hemoglobin: 11.2 g/dL — ABNORMAL LOW (ref 13.0–17.0)
Immature Granulocytes: 1 %
Lymphocytes Relative: 39 %
Lymphs Abs: 3.9 10*3/uL (ref 0.7–4.0)
MCH: 28.6 pg (ref 26.0–34.0)
MCHC: 30.8 g/dL (ref 30.0–36.0)
MCV: 93.1 fL (ref 80.0–100.0)
Monocytes Absolute: 0.8 10*3/uL (ref 0.1–1.0)
Monocytes Relative: 8 %
Neutro Abs: 4.9 10*3/uL (ref 1.7–7.7)
Neutrophils Relative %: 49 %
Platelets: 481 10*3/uL — ABNORMAL HIGH (ref 150–400)
RBC: 3.91 MIL/uL — ABNORMAL LOW (ref 4.22–5.81)
RDW: 17.9 % — ABNORMAL HIGH (ref 11.5–15.5)
WBC: 10 10*3/uL (ref 4.0–10.5)
nRBC: 0 % (ref 0.0–0.2)

## 2020-10-17 MED ORDER — METOPROLOL SUCCINATE ER 25 MG PO TB24: 25.0000 mg | ORAL_TABLET | Freq: Every day | ORAL | 3 refills | Status: DC

## 2020-10-17 NOTE — Telephone Encounter (Signed)
-----   Message from Satira Sark, MD sent at 10/17/2020  4:26 PM EDT ----- Results reviewed.  Please let him know that hemoglobin looks much better, up to 11.2.  Continue with current plan.

## 2020-10-17 NOTE — Telephone Encounter (Signed)
Contacted patient/spouse and verbalization of result note was given. No questions or concerns were had by either party.

## 2020-10-17 NOTE — Addendum Note (Signed)
Addended by: Berlinda Last on: 10/17/2020 04:32 PM   Modules accepted: Orders

## 2020-10-17 NOTE — Patient Instructions (Signed)
Medication Instructions:  Your physician has recommended you make the following change in your medication:   Toprol XL 25 mg Daily in the evening   *If you need a refill on your cardiac medications before your next appointment, please call your pharmacy*   Lab Work: Your physician recommends that you return for lab work in: Today( CBC)   If you have labs (blood work) drawn today and your tests are completely normal, you will receive your results only by: Marland Kitchen MyChart Message (if you have MyChart) OR . A paper copy in the mail If you have any lab test that is abnormal or we need to change your treatment, we will call you to review the results.   Testing/Procedures: NONE    Follow-Up: At [facility], you and your health needs are our priority.  As part of our continuing mission to provide you with exceptional heart care, we have created designated Provider Care Teams.  These Care Teams include your primary Cardiologist (physician) and Advanced Practice Providers (APPs -  Physician Assistants and Nurse Practitioners) who all work together to provide you with the care you need, when you need it.  We recommend signing up for the patient portal called "MyChart".  Sign up information is provided on this After Visit Summary.  MyChart is used to connect with patients for Virtual Visits (Telemedicine).  Patients are able to view lab/test results, encounter notes, upcoming appointments, etc.  Non-urgent messages can be sent to your provider as well.   To learn more about what you can do with MyChart, go to [email or web address].    Your next appointment:   2-3 week(s)  The format for your next appointment:   In Person  Provider:   Rozann Lesches, MD or Bernerd Pho, PA-C   Other Instructions Thank you for choosing [place] [facility]!

## 2020-10-18 ENCOUNTER — Telehealth: Payer: Self-pay | Admitting: Cardiology

## 2020-10-18 DIAGNOSIS — I255 Ischemic cardiomyopathy: Secondary | ICD-10-CM | POA: Diagnosis not present

## 2020-10-18 DIAGNOSIS — I2510 Atherosclerotic heart disease of native coronary artery without angina pectoris: Secondary | ICD-10-CM | POA: Diagnosis not present

## 2020-10-18 DIAGNOSIS — I252 Old myocardial infarction: Secondary | ICD-10-CM | POA: Diagnosis not present

## 2020-10-18 DIAGNOSIS — E1122 Type 2 diabetes mellitus with diabetic chronic kidney disease: Secondary | ICD-10-CM | POA: Diagnosis not present

## 2020-10-18 DIAGNOSIS — I5022 Chronic systolic (congestive) heart failure: Secondary | ICD-10-CM | POA: Diagnosis not present

## 2020-10-18 DIAGNOSIS — N1831 Chronic kidney disease, stage 3a: Secondary | ICD-10-CM | POA: Diagnosis not present

## 2020-10-18 DIAGNOSIS — I130 Hypertensive heart and chronic kidney disease with heart failure and stage 1 through stage 4 chronic kidney disease, or unspecified chronic kidney disease: Secondary | ICD-10-CM | POA: Diagnosis not present

## 2020-10-18 DIAGNOSIS — Z48815 Encounter for surgical aftercare following surgery on the digestive system: Secondary | ICD-10-CM | POA: Diagnosis not present

## 2020-10-18 DIAGNOSIS — I482 Chronic atrial fibrillation, unspecified: Secondary | ICD-10-CM | POA: Diagnosis not present

## 2020-10-18 NOTE — Telephone Encounter (Signed)
metoprolol succinate (TOPROL XL) 25 MG 24 hr tablet        Take 1 tablet (25 mg total) by mouth daily after supper.   Annette at [facility] called to get clarification on when he should take this medication. Please confirm the dosage and how often or when he should take. She can be reached at [phone].

## 2020-10-18 NOTE — Telephone Encounter (Signed)
Spoke with Anne Ng at [facility] and clarified that patient should be taking Toprol XL 25 mg 1 tablet by mouth after supper. She stated that caregiver was confused and gave patient medication at bed time on an empty stomach which made patient nauseous.

## 2020-10-22 DIAGNOSIS — I2510 Atherosclerotic heart disease of native coronary artery without angina pectoris: Secondary | ICD-10-CM | POA: Diagnosis not present

## 2020-10-22 DIAGNOSIS — I482 Chronic atrial fibrillation, unspecified: Secondary | ICD-10-CM | POA: Diagnosis not present

## 2020-10-22 DIAGNOSIS — I130 Hypertensive heart and chronic kidney disease with heart failure and stage 1 through stage 4 chronic kidney disease, or unspecified chronic kidney disease: Secondary | ICD-10-CM | POA: Diagnosis not present

## 2020-10-22 DIAGNOSIS — I5022 Chronic systolic (congestive) heart failure: Secondary | ICD-10-CM | POA: Diagnosis not present

## 2020-10-22 DIAGNOSIS — N1831 Chronic kidney disease, stage 3a: Secondary | ICD-10-CM | POA: Diagnosis not present

## 2020-10-22 DIAGNOSIS — I252 Old myocardial infarction: Secondary | ICD-10-CM | POA: Diagnosis not present

## 2020-10-22 DIAGNOSIS — I255 Ischemic cardiomyopathy: Secondary | ICD-10-CM | POA: Diagnosis not present

## 2020-10-22 DIAGNOSIS — E1122 Type 2 diabetes mellitus with diabetic chronic kidney disease: Secondary | ICD-10-CM | POA: Diagnosis not present

## 2020-10-22 DIAGNOSIS — Z48815 Encounter for surgical aftercare following surgery on the digestive system: Secondary | ICD-10-CM | POA: Diagnosis not present

## 2020-10-23 ENCOUNTER — Ambulatory Visit: Payer: Medicare HMO | Admitting: Podiatry

## 2020-10-23 ENCOUNTER — Other Ambulatory Visit: Payer: Self-pay

## 2020-10-23 ENCOUNTER — Encounter: Payer: Self-pay | Admitting: Podiatry

## 2020-10-23 DIAGNOSIS — B351 Tinea unguium: Secondary | ICD-10-CM

## 2020-10-23 DIAGNOSIS — M79676 Pain in unspecified toe(s): Secondary | ICD-10-CM

## 2020-10-23 DIAGNOSIS — I255 Ischemic cardiomyopathy: Secondary | ICD-10-CM | POA: Diagnosis not present

## 2020-10-23 DIAGNOSIS — N1831 Chronic kidney disease, stage 3a: Secondary | ICD-10-CM | POA: Diagnosis not present

## 2020-10-23 DIAGNOSIS — E119 Type 2 diabetes mellitus without complications: Secondary | ICD-10-CM | POA: Diagnosis not present

## 2020-10-23 DIAGNOSIS — I482 Chronic atrial fibrillation, unspecified: Secondary | ICD-10-CM | POA: Diagnosis not present

## 2020-10-23 DIAGNOSIS — I130 Hypertensive heart and chronic kidney disease with heart failure and stage 1 through stage 4 chronic kidney disease, or unspecified chronic kidney disease: Secondary | ICD-10-CM | POA: Diagnosis not present

## 2020-10-23 DIAGNOSIS — D689 Coagulation defect, unspecified: Secondary | ICD-10-CM | POA: Diagnosis not present

## 2020-10-23 DIAGNOSIS — I5022 Chronic systolic (congestive) heart failure: Secondary | ICD-10-CM | POA: Diagnosis not present

## 2020-10-23 DIAGNOSIS — E1122 Type 2 diabetes mellitus with diabetic chronic kidney disease: Secondary | ICD-10-CM | POA: Diagnosis not present

## 2020-10-23 DIAGNOSIS — I2510 Atherosclerotic heart disease of native coronary artery without angina pectoris: Secondary | ICD-10-CM | POA: Diagnosis not present

## 2020-10-23 DIAGNOSIS — I252 Old myocardial infarction: Secondary | ICD-10-CM | POA: Diagnosis not present

## 2020-10-23 DIAGNOSIS — Z48815 Encounter for surgical aftercare following surgery on the digestive system: Secondary | ICD-10-CM | POA: Diagnosis not present

## 2020-10-23 NOTE — Progress Notes (Signed)
This patient returns to my office for at risk foot care.  This patient requires this care by a professional since this patient will be at risk due to having diabetes and CKD.  Patient is taking plavix and eliquis causing coagulation defect. This patient is unable to cut nails himself since the patient cannot reach his nails.These nails are painful walking and wearing shoes.  This patient presents for at risk foot care today.  General Appearance  Alert, conversant and in no acute stress.  Vascular  Dorsalis pedis and posterior tibial  pulses are palpable  bilaterally.  Capillary return is within normal limits  bilaterally. Temperature is within normal limits  bilaterally.  Neurologic  Senn-Weinstein monofilament wire test within normal limits  bilaterally. Muscle power within normal limits bilaterally.  Nails Thick disfigured discolored nails with subungual debris  from hallux to fifth toes bilaterally. No evidence of bacterial infection or drainage bilaterally.  Asymptomatic left hallux nail.  Orthopedic  No limitations of motion  feet .  No crepitus or effusions noted.  HAV  B/L.  Overlapping second toe left foot.  Skin  normotropic skin with no porokeratosis noted bilaterally.  No signs of infections or ulcers noted.   Asymptomatic pinch callus.  Onychomycosis  Pain in right toes  Pain in left toes  Consent was obtained for treatment procedures.   Mechanical debridement of nails 1-5  bilaterally performed with a nail nipper.  Filed with dremel without incident.    Return office visit   3 months                  Told patient to return for periodic foot care and evaluation due to potential at risk complications.   Gardiner Barefoot DPM

## 2020-10-24 ENCOUNTER — Telehealth: Payer: Self-pay | Admitting: Student

## 2020-10-24 DIAGNOSIS — E1122 Type 2 diabetes mellitus with diabetic chronic kidney disease: Secondary | ICD-10-CM | POA: Diagnosis not present

## 2020-10-24 DIAGNOSIS — I252 Old myocardial infarction: Secondary | ICD-10-CM | POA: Diagnosis not present

## 2020-10-24 DIAGNOSIS — Z48815 Encounter for surgical aftercare following surgery on the digestive system: Secondary | ICD-10-CM | POA: Diagnosis not present

## 2020-10-24 DIAGNOSIS — I130 Hypertensive heart and chronic kidney disease with heart failure and stage 1 through stage 4 chronic kidney disease, or unspecified chronic kidney disease: Secondary | ICD-10-CM | POA: Diagnosis not present

## 2020-10-24 DIAGNOSIS — N1831 Chronic kidney disease, stage 3a: Secondary | ICD-10-CM | POA: Diagnosis not present

## 2020-10-24 DIAGNOSIS — I2510 Atherosclerotic heart disease of native coronary artery without angina pectoris: Secondary | ICD-10-CM | POA: Diagnosis not present

## 2020-10-24 DIAGNOSIS — I482 Chronic atrial fibrillation, unspecified: Secondary | ICD-10-CM | POA: Diagnosis not present

## 2020-10-24 DIAGNOSIS — I255 Ischemic cardiomyopathy: Secondary | ICD-10-CM | POA: Diagnosis not present

## 2020-10-24 DIAGNOSIS — I5022 Chronic systolic (congestive) heart failure: Secondary | ICD-10-CM | POA: Diagnosis not present

## 2020-10-24 MED ORDER — METOPROLOL SUCCINATE ER 25 MG PO TB24: ORAL_TABLET | ORAL | 3 refills | Status: DC

## 2020-10-24 NOTE — Telephone Encounter (Signed)
New message     Pt c/o swelling: STAT is pt has developed SOB within 24 hours  1) How much weight have you gained and in what time span?  Over 2  2) If swelling, where is the swelling located? Legs   3) Are you currently taking a fluid pill? They stopped his fluid pill  4) Are you currently SOB? no

## 2020-10-24 NOTE — Telephone Encounter (Signed)
Thank you.  Please see my recent note.  We just added back Toprol-XL 25 mg in the evening.  If he has been taking this regularly and blood pressure has been in similar range to what is reported today, would add Toprol-XL 12.5 mg dose in the morning and will continue to uptitrate as necessary.

## 2020-10-24 NOTE — Addendum Note (Signed)
Addended by: Levonne Hubert on: 10/24/2020 12:24 PM   Modules accepted: Orders

## 2020-10-24 NOTE — Telephone Encounter (Signed)
Spoke with [facility]N who states that pt's HR is 121bpm on today. BP is 126/64. His weights are as follows 4/4 145lbs, 4/5 143lbs, 4/6 143lbs. She reports that lung are clear and pt denies SOB at this time. Ankles measure Rt 23 cm on today and 20 on yesterday, Lt 21cm on today and 18 on yesterday. Pt denies palpitations at this time. Please advise.

## 2020-10-24 NOTE — Telephone Encounter (Signed)
Returned call to [facility]. No answer. Left msg to call back.

## 2020-10-24 NOTE — Telephone Encounter (Signed)
Spoke with Mrs. [place] wife of pt. Who states that pt is taking Toprol XL 25 mg nightly. Wife understands to have pt take 12.5 mg Toprol XL in the morning and 25 mg in the night. She will continue to monitor BP and Hr and report changes.

## 2020-10-26 DIAGNOSIS — Z48815 Encounter for surgical aftercare following surgery on the digestive system: Secondary | ICD-10-CM | POA: Diagnosis not present

## 2020-10-26 DIAGNOSIS — I2510 Atherosclerotic heart disease of native coronary artery without angina pectoris: Secondary | ICD-10-CM | POA: Diagnosis not present

## 2020-10-26 DIAGNOSIS — I255 Ischemic cardiomyopathy: Secondary | ICD-10-CM | POA: Diagnosis not present

## 2020-10-26 DIAGNOSIS — N1831 Chronic kidney disease, stage 3a: Secondary | ICD-10-CM | POA: Diagnosis not present

## 2020-10-26 DIAGNOSIS — I130 Hypertensive heart and chronic kidney disease with heart failure and stage 1 through stage 4 chronic kidney disease, or unspecified chronic kidney disease: Secondary | ICD-10-CM | POA: Diagnosis not present

## 2020-10-26 DIAGNOSIS — I5022 Chronic systolic (congestive) heart failure: Secondary | ICD-10-CM | POA: Diagnosis not present

## 2020-10-26 DIAGNOSIS — I482 Chronic atrial fibrillation, unspecified: Secondary | ICD-10-CM | POA: Diagnosis not present

## 2020-10-26 DIAGNOSIS — E1122 Type 2 diabetes mellitus with diabetic chronic kidney disease: Secondary | ICD-10-CM | POA: Diagnosis not present

## 2020-10-26 DIAGNOSIS — I252 Old myocardial infarction: Secondary | ICD-10-CM | POA: Diagnosis not present

## 2020-10-30 DIAGNOSIS — E1122 Type 2 diabetes mellitus with diabetic chronic kidney disease: Secondary | ICD-10-CM | POA: Diagnosis not present

## 2020-10-30 DIAGNOSIS — I130 Hypertensive heart and chronic kidney disease with heart failure and stage 1 through stage 4 chronic kidney disease, or unspecified chronic kidney disease: Secondary | ICD-10-CM | POA: Diagnosis not present

## 2020-10-30 DIAGNOSIS — I482 Chronic atrial fibrillation, unspecified: Secondary | ICD-10-CM | POA: Diagnosis not present

## 2020-10-30 DIAGNOSIS — N1831 Chronic kidney disease, stage 3a: Secondary | ICD-10-CM | POA: Diagnosis not present

## 2020-10-30 DIAGNOSIS — Z48815 Encounter for surgical aftercare following surgery on the digestive system: Secondary | ICD-10-CM | POA: Diagnosis not present

## 2020-10-30 DIAGNOSIS — R21 Rash and other nonspecific skin eruption: Secondary | ICD-10-CM | POA: Diagnosis not present

## 2020-10-30 DIAGNOSIS — I255 Ischemic cardiomyopathy: Secondary | ICD-10-CM | POA: Diagnosis not present

## 2020-10-30 DIAGNOSIS — E1129 Type 2 diabetes mellitus with other diabetic kidney complication: Secondary | ICD-10-CM | POA: Diagnosis not present

## 2020-10-30 DIAGNOSIS — I2510 Atherosclerotic heart disease of native coronary artery without angina pectoris: Secondary | ICD-10-CM | POA: Diagnosis not present

## 2020-10-30 DIAGNOSIS — I252 Old myocardial infarction: Secondary | ICD-10-CM | POA: Diagnosis not present

## 2020-10-30 DIAGNOSIS — I5022 Chronic systolic (congestive) heart failure: Secondary | ICD-10-CM | POA: Diagnosis not present

## 2020-10-31 DIAGNOSIS — Z794 Long term (current) use of insulin: Secondary | ICD-10-CM | POA: Diagnosis not present

## 2020-10-31 DIAGNOSIS — I129 Hypertensive chronic kidney disease with stage 1 through stage 4 chronic kidney disease, or unspecified chronic kidney disease: Secondary | ICD-10-CM | POA: Diagnosis not present

## 2020-10-31 DIAGNOSIS — N1831 Chronic kidney disease, stage 3a: Secondary | ICD-10-CM | POA: Diagnosis not present

## 2020-10-31 DIAGNOSIS — E1129 Type 2 diabetes mellitus with other diabetic kidney complication: Secondary | ICD-10-CM | POA: Diagnosis not present

## 2020-10-31 DIAGNOSIS — E785 Hyperlipidemia, unspecified: Secondary | ICD-10-CM | POA: Diagnosis not present

## 2020-10-31 DIAGNOSIS — I10 Essential (primary) hypertension: Secondary | ICD-10-CM | POA: Diagnosis not present

## 2020-11-01 ENCOUNTER — Other Ambulatory Visit: Payer: Self-pay

## 2020-11-01 ENCOUNTER — Encounter: Payer: Self-pay | Admitting: Student

## 2020-11-01 ENCOUNTER — Ambulatory Visit (INDEPENDENT_AMBULATORY_CARE_PROVIDER_SITE_OTHER): Payer: Medicare HMO | Admitting: Student

## 2020-11-01 VITALS — BP 108/50 | HR 110 | Ht 70.5 in | Wt 149.0 lb

## 2020-11-01 DIAGNOSIS — Z8673 Personal history of transient ischemic attack (TIA), and cerebral infarction without residual deficits: Secondary | ICD-10-CM | POA: Diagnosis not present

## 2020-11-01 DIAGNOSIS — I2510 Atherosclerotic heart disease of native coronary artery without angina pectoris: Secondary | ICD-10-CM | POA: Diagnosis not present

## 2020-11-01 DIAGNOSIS — I255 Ischemic cardiomyopathy: Secondary | ICD-10-CM

## 2020-11-01 DIAGNOSIS — I480 Paroxysmal atrial fibrillation: Secondary | ICD-10-CM | POA: Diagnosis not present

## 2020-11-01 DIAGNOSIS — E785 Hyperlipidemia, unspecified: Secondary | ICD-10-CM

## 2020-11-01 DIAGNOSIS — I513 Intracardiac thrombosis, not elsewhere classified: Secondary | ICD-10-CM

## 2020-11-01 MED ORDER — METOPROLOL SUCCINATE ER 25 MG PO TB24: 25.0000 mg | ORAL_TABLET | Freq: Two times a day (BID) | ORAL | 3 refills | Status: DC

## 2020-11-01 NOTE — Patient Instructions (Signed)
Medication Instructions:  INCREASE Toprol XL to 25 mg TWICE a day  Call us if your blood pressure begins to decrease.   *If you need a refill on your cardiac medications before your next appointment, please call your pharmacy*   Lab Work: None today If you have labs (blood work) drawn today and your tests are completely normal, you will receive your results only by: Marland Kitchen MyChart Message (if you have MyChart) OR . A paper copy in the mail If you have any lab test that is abnormal or we need to change your treatment, we will call you to review the results.   Testing/Procedures: None today   Follow-Up: At [facility], you and your health needs are our priority.  As part of our continuing mission to provide you with exceptional heart care, we have created designated Provider Care Teams.  These Care Teams include your primary Cardiologist (physician) and Advanced Practice Providers (APPs -  Physician Assistants and Nurse Practitioners) who all work together to provide you with the care you need, when you need it.  We recommend signing up for the patient portal called "MyChart".  Sign up information is provided on this After Visit Summary.  MyChart is used to connect with patients for Virtual Visits (Telemedicine).  Patients are able to view lab/test results, encounter notes, upcoming appointments, etc.  Non-urgent messages can be sent to your provider as well.   To learn more about what you can do with MyChart, go to [email or web address].    Your next appointment:   2 month(s)  The format for your next appointment:   In Person  Provider:   Rozann Lesches, MD   Other Instructions None

## 2020-11-01 NOTE — Progress Notes (Signed)
Cardiology Office Note    Date:  11/01/2020   ID:  Patrick Miranda, DOB [birth year], MRN [id number]  PCP:  Burnard Bunting, MD  Cardiologist: Rozann Lesches, MD    Chief Complaint  Patient presents with  . Follow-up    2 week visit    History of Present Illness:    Patrick Miranda is a 75 y.o. male with past medical history of CAD (s/p CABG in 06/2020 with LIMA-LAD, SVG-D1 and SVG-RCA), chronic combined systolic and diastolic CHF, paroxysmal atrial fibrillation (occurring in the setting of COVID-19 and post-op setting), PE (occurring while diagnosed with COVID in 11/2019), apical thrombus (by echo in 07/2020), HTN, HLD, Type 2 DM, history of CVA and history of PE who presents to the office today for 2-week follow-up.  He was recently examined Dr. Domenic Polite on 10/17/2020 and had recently been admitted to [facility] for recurrent abdominal pain and underwent exploratory laparotomy and required small bowel resection due to perforation. He was discharged to rehab and had recently followed up with Dr. Cyndia Bent with Lopressor being discontinued given hypotension. He denied any recent anginal symptoms and reported that his appetite and fatigue were gradually improving since returning home. He was working with [facility].  HR was in the 120's at the time of his visit and EKG did not show recurrent atrial fibrillation or flutter. He was restarted on Toprol-XL 25 mg in the evening and continued on ASA, Crestor and Eliquis.  Home Health did reach out on 10/24/2020 and his heart rate was in the 120's and BP was at 126/64. Dr. Domenic Polite recommended starting Toprol-XL 12.5 mg in the morning while continuing 25 mg in the evening.  In talking with the patient and his wife today, he reports he still has fatigue but symptoms are continuing to improve. 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 He denies any recent chest pain or palpitations. His dyspnea continues  to improve. No recent orthopnea or PND. He does have occasional lower extremity edema but has not had to utilize PRN Lasix recently. No recurrent abdominal pain and his appetite continues to improve.    Past Medical History:  Diagnosis Date  . Arthritis   . 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 Cerebral artery disease   . Coronary artery disease    a. s/p CABG in 06/2020 with LIMA-LAD, SVG-D1 and SVG-RCA  . COVID-07 Dec 2019  . Depression   . Essential hypertension   . Hernia, umbilical   . HOH (hard of hearing)   . Hyperlipidemia   . Ischemic cardiomyopathy   . Ischemic necrosis of small bowel ([place])   . Left ventricular mural thrombus   . NSTEMI (non-ST elevated myocardial infarction) ([place])   . OSA on CPAP   . Pulmonary emboli [facility])    May 2021  . Skin cancer   . Type 2 diabetes mellitus ([place])     Past Surgical History:  Procedure Laterality Date  . COLONOSCOPY W/ POLYPECTOMY    . CORONARY ARTERY BYPASS GRAFT N/A 07/09/2020   Procedure: CORONARY ARTERY BYPASS GRAFTING (CABG) TIMES THREE , USING LEFT INTERNAL MAMMARY ARTERY AND RIGHT LEG GREATER SAPHENOUS VEIN HARVESTED ENDOSCOPICALLY;  Surgeon: Ivin Poot, MD;  Location: [place];  Service: Open Heart Surgery;  Laterality: N/A;  . LEFT HEART CATH AND CORONARY ANGIOGRAPHY N/A 07/04/2020   Procedure: LEFT HEART CATH AND CORONARY ANGIOGRAPHY;  Surgeon: Shelva Majestic  A, MD;  Location: [place] [facility];  Service: Cardiovascular;  Laterality: N/A;  . TEE WITHOUT CARDIOVERSION N/A 07/09/2020   Procedure: TRANSESOPHAGEAL ECHOCARDIOGRAM (TEE);  Surgeon: Prescott Gum, Collier Salina, MD;  Location: [place];  Service: Open Heart Surgery;  Laterality: N/A;  . TOTAL KNEE ARTHROPLASTY Left 04/22/2013   Procedure: TOTAL KNEE ARTHROPLASTY- left;  Surgeon: Augustin Schooling, MD;  Location: [place];  Service: Orthopedics;  Laterality: Left;  with femoral block  . TOTAL KNEE ARTHROPLASTY Right 01/06/2014   Procedure: RIGHT TOTAL KNEE ARTHROPLASTY;   Surgeon: Augustin Schooling, MD;  Location: [place];  Service: Orthopedics;  Laterality: Right;    Current Medications: Outpatient Medications Prior to Visit  Medication Sig Dispense Refill  . acetaminophen (TYLENOL) 325 MG tablet Take 2 tablets (650 mg total) by mouth every 6 (six) hours as needed for mild pain or headache (or Fever >/= 101).    Marland Kitchen apixaban (ELIQUIS) 5 MG TABS tablet Take 1 tablet (5 mg total) by mouth 2 (two) times daily. 60 tablet 2  . aspirin EC 81 MG EC tablet Take 1 tablet (81 mg total) by mouth daily. Swallow whole. 30 tablet 11  . BD PEN NEEDLE NANO U/F 32G X 4 MM MISC 1 each by Other route daily.     . Continuous Blood Gluc Sensor (FREESTYLE LIBRE 2 SENSOR) MISC See admin instructions.    Marland Kitchen doxycycline (VIBRA-TABS) 100 MG tablet 1 tablet    . fenofibrate 160 MG tablet Take 160 mg by mouth daily.    . finasteride (PROSCAR) 5 MG tablet Take 5 mg by mouth daily.    Marland Kitchen HUMALOG KWIKPEN 100 UNIT/ML KwikPen Inject 5-8 Units into the skin with breakfast, with lunch, and with evening meal.    . metFORMIN (GLUCOPHAGE) 500 MG tablet     . ONETOUCH VERIO test strip 1 each by Other route in the morning, at noon, and at bedtime.   3  . rosuvastatin (CRESTOR) 20 MG tablet Take 1 tablet (20 mg total) by mouth daily. 30 tablet 1  . tamsulosin (FLOMAX) 0.4 MG CAPS capsule Take 1 capsule (0.4 mg total) by mouth daily. (Patient taking differently: Take 0.4 mg by mouth at bedtime.) 30 capsule 0  . TOUJEO SOLOSTAR 300 UNIT/ML Solostar Pen Inject 20 Units into the skin daily.    . metoprolol succinate (TOPROL XL) 25 MG 24 hr tablet Take 12.5 mg in the AM and Take 25 mg in the PM 135 tablet 3  . Empagliflozin-metFORMIN HCl (SYNJARDY) 11-998 MG TABS Take by mouth.    . hyoscyamine (LEVBID) 0.375 MG 12 hr tablet Take 0.375 mg by mouth at bedtime.     No facility-administered medications prior to visit.     Allergies:   Lipitor [atorvastatin] and Sitagliptin   Social History   Socioeconomic  History  . Marital status: Married    Spouse name: Not on file  . Number of children: Not on file  . Years of education: Not on file  . Highest education level: Not on file  Occupational History  . Not on file  Tobacco Use  . Smoking status: Never Smoker  . Smokeless tobacco: Never Used  Vaping Use  . Vaping Use: Never used  Substance and Sexual Activity  . Alcohol use: No  . Drug use: No  . Sexual activity: Not on file  Other Topics Concern  . Not on file  Social History Narrative  . Not on file   Social Determinants of  Health   Financial Resource Strain: Not on file  Food Insecurity: No Food Insecurity  . Worried About Charity fundraiser in the Last Year: Never true  . Ran Out of Food in the Last Year: Never true  Transportation Needs: No Transportation Needs  . Lack of Transportation (Medical): No  . Lack of Transportation (Non-Medical): No  Physical Activity: Not on file  Stress: Not on file  Social Connections: Not on file     Family History:  The patient's family history includes CAD in his brother.   Review of Systems:   Please see the history of present illness.     General:  No chills, fever, night sweats or weight changes. Positive for fatigue.  Cardiovascular:  No chest pain, dyspnea on exertion, edema, orthopnea, palpitations, paroxysmal nocturnal dyspnea. Dermatological: Positive for pustules along abdomen.  Respiratory: No cough, dyspnea Urologic: No hematuria, dysuria Abdominal:   No nausea, vomiting, diarrhea, bright red blood per rectum, melena, or hematemesis Neurologic:  No visual changes, wkns, changes in mental status. All other systems reviewed and are otherwise negative except as noted above.   Physical Exam:    VS:  BP (!) 108/50   Pulse (!) 110   Ht 5' 10.5" (1.791 m)   Wt 149 lb (67.6 kg)   SpO2 98%   BMI 21.08 kg/m    General: Well developed, well nourished,male appearing in no acute distress. Head: Normocephalic,  atraumatic. Neck: No carotid bruits. JVD not elevated.  Lungs: Respirations regular and unlabored, without wheezes or rales.  Heart: Regular rhythm, tachycardiac rate. No S3 or S4.  No murmur, no rubs, or gallops appreciated. Abdomen: Appears non-distended. No obvious abdominal masses. Msk:  Strength and tone appear normal for age. No obvious joint deformities or effusions. Extremities: No clubbing or cyanosis. Trace lower extremity edema.  Distal pedal pulses are 2+ bilaterally. Neuro: Alert and oriented X 3. Moves all extremities spontaneously. No focal deficits noted. Psych:  Responds to questions appropriately with a normal affect. Skin: Small pustules along abdomen, not near his recent incision.   Wt Readings from Last 3 Encounters:  11/01/20 149 lb (67.6 kg)  10/17/20 145 lb (65.8 kg)  10/03/20 138 lb (62.6 kg)     Studies/Labs Reviewed:   EKG:  EKG is not ordered today.    Recent Labs: 07/05/2020: TSH 2.525 08/04/2020: B Natriuretic Peptide 656.9 08/10/2020: Magnesium 1.9 09/11/2020: ALT 19; BUN 18; Creatinine, Ser 1.21; Potassium 4.2; Sodium 137 10/17/2020: Hemoglobin 11.2; Platelets 481   Lipid Panel    Component Value Date/Time   CHOL 65 09/09/2020 0402   TRIG 94 09/09/2020 0402   HDL 21 (L) 09/09/2020 0402   CHOLHDL 3.1 09/09/2020 0402   VLDL 19 09/09/2020 0402   LDLCALC 25 09/09/2020 0402    Additional studies/ records that were reviewed today include:   Cardiac Catheterization: 06/2020  Ost LAD to Prox LAD lesion is 40% stenosed.  2nd Diag lesion is 90% stenosed.  1st Diag-1 lesion is 95% stenosed.  1st Diag-2 lesion is 90% stenosed with 90% stenosed side branch in Lat 1st Diag.  Mid LAD-1 lesion is 90% stenosed.  Mid LAD-2 lesion is 70% stenosed.  Lat 2nd Mrg lesion is 95% stenosed.  1st Mrg lesion is 20% stenosed.  Prox RCA lesion is 95% stenosed.  Dist LAD lesion is 50% stenosed.   Severe coronary obstructive disease with significant  calcification involving the LAD.  The LAD is diffusely diseased extending from the proximal to  mid segment with 40% diffuse proximal stenosis, calcified 90% stenosis on a bend in the vessel in the region of the first and second diagonal vessels with 95% stenoses in the ostium of the first diagonal and 90% in the second diagonal, 70% mid stenosis and 50% mid distal stenoses.  The circumflex vessel has 20% smooth narrowing in the first marginal branch with 95% focal stenosis in a branch of the second marginal vessel.  The RCA has a focal 95% mid stenosis with reduced antegrade distal flow secondary to competitive left-to-right collateralization.  Normal LV function with EF estimated at 55 mm; LVEDP 12 mm Hg.  RECOMMENDATION: Surgical consultation for CABG revascularization.  Patient will need to hold Plavix and will need Plavix washout.   Echocardiogram: 08/2020 IMPRESSIONS    1. Left ventricular ejection fraction, by estimation, is 40%. LVEF is  depressed with akinesis of the inferior/inferoseptal base with aneurysmal  dilitation. There is also akinesis of the apex. Compared to echo from  January 2022, the apical thrombus is no  longer present. The diastolic parameters are consistent with Grade I  diastolic dysfunction (impaired relaxation).  2. Right ventricular systolic function is normal. The right ventricular  size is normal.  3. Left atrial size was mildly dilated.  4. The mitral valve is abnormal. Mild mitral valve regurgitation.  5. The aortic valve is normal in structure. Aortic valve regurgitation is  mild.   Echocardiogram: 08/2020 (from [place]) SUMMARY  Mild left ventricular hypertrophy  The left ventricular size is normal.  Left ventricular systolic function is mildly reduced.  LV ejection fraction = 45-50%.  The basal LV inferior wall is akinetic and aneurysmal. The base of the  inferoseptal wall also appears to be so. In the absence of coronary   artery disease, sarcoidosis can have such an appearance. Clinical  correlation is recommended.  The right ventricle is normal in size and function.  The left atrium is mildly dilated.  There is no significant valvular stenosis or regurgitation.  The ascendingaorta is normal size.  The inferior vena cava was not visualized during the exam.  There is no pericardial effusion.  There is no comparison study available.    Assessment:    1. Coronary artery disease involving native coronary artery of native heart without angina pectoris   2. Ischemic cardiomyopathy   3. LV (left ventricular) mural thrombus   4. Paroxysmal atrial fibrillation (HCC)   5. Hyperlipidemia LDL goal <70   6. History of CVA (cerebrovascular accident)      Plan:   In order of problems listed above:  1. CAD - He is s/p CABG in 06/2020 with LIMA-LAD, SVG-D1 and SVG-RCA. He denies any recent anginal symptoms. His sternal incision appears well-healing but he does have small pustules along his abdomen. Was started on Doxycycline by his PCP on Tuesday and I encouraged him to reach out to his surgeon at [facility] if symptoms do not improve. Currently not near his surgical incision.  - Continue ASA 81mg  daily, Toprol-XL (with dose adjustment from 12.5mg  in AM/25mg  in PM to 25mg  BID) and Crestor 20mg  daily.   2. Chronic Combined Systolic and Diastolic CHF/Apical Thrombus - His EF was at 45-50% by most recent echocardiogram and his apical thrombus had resolved. Will titrate Toprol-XL to 25mg  BID given his tachycardia. No ACE-I/ARB/ARNI due to soft BP. He does have an Rx for Lasix to use on a PRN basis.  - He was previously on Coumadin but INR's were difficult  to maintain in a therapeutic range. Remains on Eliquis 5mg  BID.   3. Paroxysmal Atrial Fibrillation - He experienced prior episodes in the setting of COVID-19 and post-op setting following CABG. He is tachycardiac today but rates have improved from his last office  visit and rhythm is regular on examination. Will titrate Toprol-XL to 25mg  BID. I encouraged them to report back if he develops any hypotension.  - He remains on Eliquis 5mg  BID for anticoagulation.   4. HLD - LDL was at 25 in 08/2020. At goal of less than 70. Continue Crestor 20mg  daily and Fenofibrate 160mg  daily.   5. History of CVA - He does have scheduled follow-up with Neurology next month. Remains on ASA 81mg  daily, Eliquis 5mg  BID and Crestor 20mg  daily.     Medication Adjustments/Labs and Tests Ordered: Current medicines are reviewed at length with the patient today.  Concerns regarding medicines are outlined above.  Medication changes, Labs and Tests ordered today are listed in the Patient Instructions below. Patient Instructions  Medication Instructions:  INCREASE Toprol XL to 25 mg TWICE a day  Call us if your blood pressure begins to decrease.   *If you need a refill on your cardiac medications before your next appointment, please call your pharmacy*   Lab Work: None today If you have labs (blood work) drawn today and your tests are completely normal, you will receive your results only by: Marland Kitchen MyChart Message (if you have MyChart) OR . A paper copy in the mail If you have any lab test that is abnormal or we need to change your treatment, we will call you to review the results.   Testing/Procedures: None today   Follow-Up: At [facility], you and your health needs are our priority.  As part of our continuing mission to provide you with exceptional heart care, we have created designated Provider Care Teams.  These Care Teams include your primary Cardiologist (physician) and Advanced Practice Providers (APPs -  Physician Assistants and Nurse Practitioners) who all work together to provide you with the care you need, when you need it.  We recommend signing up for the patient portal called "MyChart".  Sign up information is provided on this After Visit Summary.  MyChart  is used to connect with patients for Virtual Visits (Telemedicine).  Patients are able to view lab/test results, encounter notes, upcoming appointments, etc.  Non-urgent messages can be sent to your provider as well.   To learn more about what you can do with MyChart, go to [email or web address].    Your next appointment:   2 month(s)  The format for your next appointment:   In Person  Provider:   Rozann Lesches, MD   Other Instructions None           Signed, Erma Heritage, PA-C  11/01/2020 7:29 PM    [facility]. [address] [place], [place] [address] Phone: [phone] Fax: [phone]

## 2020-11-02 DIAGNOSIS — E1122 Type 2 diabetes mellitus with diabetic chronic kidney disease: Secondary | ICD-10-CM | POA: Diagnosis not present

## 2020-11-02 DIAGNOSIS — I2510 Atherosclerotic heart disease of native coronary artery without angina pectoris: Secondary | ICD-10-CM | POA: Diagnosis not present

## 2020-11-02 DIAGNOSIS — I255 Ischemic cardiomyopathy: Secondary | ICD-10-CM | POA: Diagnosis not present

## 2020-11-02 DIAGNOSIS — I482 Chronic atrial fibrillation, unspecified: Secondary | ICD-10-CM | POA: Diagnosis not present

## 2020-11-02 DIAGNOSIS — Z48815 Encounter for surgical aftercare following surgery on the digestive system: Secondary | ICD-10-CM | POA: Diagnosis not present

## 2020-11-02 DIAGNOSIS — N1831 Chronic kidney disease, stage 3a: Secondary | ICD-10-CM | POA: Diagnosis not present

## 2020-11-02 DIAGNOSIS — I130 Hypertensive heart and chronic kidney disease with heart failure and stage 1 through stage 4 chronic kidney disease, or unspecified chronic kidney disease: Secondary | ICD-10-CM | POA: Diagnosis not present

## 2020-11-02 DIAGNOSIS — I252 Old myocardial infarction: Secondary | ICD-10-CM | POA: Diagnosis not present

## 2020-11-02 DIAGNOSIS — I5022 Chronic systolic (congestive) heart failure: Secondary | ICD-10-CM | POA: Diagnosis not present

## 2020-11-04 NOTE — Progress Notes (Signed)
Subjective:    Patient ID: Patrick Miranda, male    DOB: 1945/12/25, 75 y.o.   MRN: [id number]  HPI M never smoker followed for OSA vasomotor rhinitis, complicated by history brainstem CVA, CAD, DM 2, HBP, anemia. NPSG 2007 AHI 31.3/ hr, CPAP to 9, weight 180 lbs.  ----------------------------------------------------------------------------------------   07/13/2019- Virtual Visit via Telephone Note  History of Present Illness:  75 year old male never smoker followed for OSA, chronic rhinitis, complicated by hx Brainstem CVA, CAD, DM 2, HBP, anemia, CKD CPAP auto 5-15/ [place] 97%, AHI 6.6/ hr Ritalin 5 mg BID PRN Had flu vax Asks pressure change Using ritalin only on Sunday for church now   Observations/Objective:   Assessment and Plan: OSA benefits. Try reducing range to 7-13 for comfort Hypersomnia- ok to use ritalin and nap when needed, safe driving Rhinitis is currently quiescent  Follow Up Instructions: 1 year    11/05/20-  75 year old male never smoker followed for OSA, chronic rhinitis, complicated by hx Brainstem CVA, CAD/Ischemic CM/ CABG,  DM 2, HBP, anemia, CKD, Laparotomy for Small Bowel Perforation Feb, 2022, CPAP auto 7-13/ [facility]    AirSense 10 AutoSet Download-compliance 97%, AHI 8.8/ hr   Moderately high leak Body weight today-146 lbs covid vax- Flu vax- -----Triple bypass in Dec, diagnosed with dementia  Patient and wife state that patient is doing good, machine working good and he is sleeping good. Wife states he does snore and sleep with mouth open with machine on.  Note moderate leak. Snores through open mouth with nasal pillows mask on.  Still sleeping ok.  CXR 10/03/20-  IMPRESSION: No active cardiopulmonary disease.  ROS-see HPI   + = positive Constitutional:    weight loss, night sweats, fevers, chills, + fatigue, lassitude. HEENT:    headaches, difficulty swallowing, tooth/dental problems, sore throat,        sneezing, itching, ear ache, nasal congestion, post nasal drip, snoring CV:    chest pain, orthopnea, PND, swelling in lower extremities, anasarca,                                                      dizziness, palpitations Resp:   shortness of breath with exertion or at rest.                productive cough,   non-productive cough, coughing up of blood.              change in color of mucus.  wheezing.   Skin:    rash or lesions. GI:  No-   heartburn, indigestion, abdominal pain, nausea, vomiting, GU:  MS:   joint pain, stiffness, Neuro-     nothing unusual Psych:  change in mood or affect.  depression or anxiety.   memory loss.      Objective:   Physical Exam OBJ- Physical Exam General- Alert, Oriented, Affect-appropriate, Distress- none acute, trim,  Skin- rash-none, lesions- none, excoriation- none Lymphadenopathy- none Head- atraumatic            Eyes- Gross vision intact, PERRLA, conjunctivae and secretions clear            Ears- Hearing, canals-normal            Nose- + small pressure irritation right nostril, no-Septal dev, mucus, polyps, erosion, perforation  Throat- Mallampati II-III , mucosa clear , drainage- none, tonsils- atrophic,hoarse Neck- flexible , trachea midline, no stridor , thyroid nl, carotid no bruit Chest - symmetrical excursion , unlabored           Heart/CV- RRR , no murmur , no gallop  , no rub, nl s1 s2                           - JVD- none , edema- none, stasis changes- none, varices- none           Lung- clear to P&A, wheeze- none, cough-none , dullness-none, rub- none           Chest wall-  Abd-  Br/ Gen/ Rectal- Not done, not indicated Extrem- cyanosis- none, clubbing, none, atrophy- none, strength- nl Neuro- grossly intact to observation  Assessment & Plan:

## 2020-11-05 ENCOUNTER — Encounter: Payer: Self-pay | Admitting: Internal Medicine

## 2020-11-05 ENCOUNTER — Other Ambulatory Visit: Payer: Self-pay

## 2020-11-05 ENCOUNTER — Ambulatory Visit (INDEPENDENT_AMBULATORY_CARE_PROVIDER_SITE_OTHER): Payer: Medicare HMO | Admitting: Internal Medicine

## 2020-11-05 VITALS — BP 130/64 | HR 114 | Temp 97.5°F | Ht 70.5 in | Wt 146.2 lb

## 2020-11-05 DIAGNOSIS — G4733 Obstructive sleep apnea (adult) (pediatric): Secondary | ICD-10-CM | POA: Diagnosis not present

## 2020-11-05 DIAGNOSIS — Z9989 Dependence on other enabling machines and devices: Secondary | ICD-10-CM

## 2020-11-05 NOTE — Patient Instructions (Signed)
Order- DME [facility]  - please add chin strap. Continue CPAP auto 7-13, mask of choice, humidifier, supplies, AirView/ card  Please call if we can help

## 2020-11-06 ENCOUNTER — Telehealth: Payer: Self-pay | Admitting: Internal Medicine

## 2020-11-06 DIAGNOSIS — E1122 Type 2 diabetes mellitus with diabetic chronic kidney disease: Secondary | ICD-10-CM | POA: Diagnosis not present

## 2020-11-06 DIAGNOSIS — I5022 Chronic systolic (congestive) heart failure: Secondary | ICD-10-CM | POA: Diagnosis not present

## 2020-11-06 DIAGNOSIS — I482 Chronic atrial fibrillation, unspecified: Secondary | ICD-10-CM | POA: Diagnosis not present

## 2020-11-06 DIAGNOSIS — I252 Old myocardial infarction: Secondary | ICD-10-CM | POA: Diagnosis not present

## 2020-11-06 DIAGNOSIS — N1831 Chronic kidney disease, stage 3a: Secondary | ICD-10-CM | POA: Diagnosis not present

## 2020-11-06 DIAGNOSIS — Z48815 Encounter for surgical aftercare following surgery on the digestive system: Secondary | ICD-10-CM | POA: Diagnosis not present

## 2020-11-06 DIAGNOSIS — I130 Hypertensive heart and chronic kidney disease with heart failure and stage 1 through stage 4 chronic kidney disease, or unspecified chronic kidney disease: Secondary | ICD-10-CM | POA: Diagnosis not present

## 2020-11-06 DIAGNOSIS — G4733 Obstructive sleep apnea (adult) (pediatric): Secondary | ICD-10-CM

## 2020-11-06 DIAGNOSIS — I2510 Atherosclerotic heart disease of native coronary artery without angina pectoris: Secondary | ICD-10-CM | POA: Diagnosis not present

## 2020-11-06 DIAGNOSIS — I255 Ischemic cardiomyopathy: Secondary | ICD-10-CM | POA: Diagnosis not present

## 2020-11-06 NOTE — Telephone Encounter (Signed)
New order placed. Pt's wife aware. Nothing further needed at this time- will close encounter.

## 2020-11-06 NOTE — Telephone Encounter (Signed)
Pts wife stated that their insurance is not covered by [employer] and needs his CPAP order it to be sent to [facility] in [place], [place].Marland Kitchen Their fax number; [phone]. Kam[place] regard [phone]

## 2020-11-06 NOTE — Telephone Encounter (Signed)
PCC's  The pt stated that his insurance is not in with [facility] and needs the order for the CPAP to be sent to [facility] in [place].   Do we need to send in a new order for this?  Thanks

## 2020-11-06 NOTE — Telephone Encounter (Signed)
Yes please, since the order from yesterday specifically states [facility].

## 2020-11-08 DIAGNOSIS — R3912 Poor urinary stream: Secondary | ICD-10-CM | POA: Diagnosis not present

## 2020-11-08 DIAGNOSIS — N401 Enlarged prostate with lower urinary tract symptoms: Secondary | ICD-10-CM | POA: Diagnosis not present

## 2020-11-08 DIAGNOSIS — R3914 Feeling of incomplete bladder emptying: Secondary | ICD-10-CM | POA: Diagnosis not present

## 2020-11-09 DIAGNOSIS — R2689 Other abnormalities of gait and mobility: Secondary | ICD-10-CM | POA: Diagnosis not present

## 2020-11-09 DIAGNOSIS — N183 Chronic kidney disease, stage 3 unspecified: Secondary | ICD-10-CM | POA: Diagnosis not present

## 2020-11-09 DIAGNOSIS — I509 Heart failure, unspecified: Secondary | ICD-10-CM | POA: Diagnosis not present

## 2020-11-12 ENCOUNTER — Ambulatory Visit (INDEPENDENT_AMBULATORY_CARE_PROVIDER_SITE_OTHER): Payer: Medicare HMO | Admitting: Gastroenterology

## 2020-11-13 ENCOUNTER — Telehealth: Payer: Self-pay | Admitting: Internal Medicine

## 2020-11-13 NOTE — Telephone Encounter (Signed)
I have refaxed the order and sleep study to [facility] at fax # [phone] per phone note

## 2020-11-13 NOTE — Telephone Encounter (Signed)
Faxed and rec'd faxed confirmed.  Called and spoke w/ Museum/gallery conservator at [employer] and she confirmed they rec'd faxed order. Nothing further needed.

## 2020-11-13 NOTE — Telephone Encounter (Signed)
I called and spoke with patient wife who is on the [facility] and she stated [facility] stated they have not received the order for the CPAP supplies. I checked the referrals and see where it was sent in on 11/06/2020 and then the HST was sent in on 11/07/20. [facility] and [facility] have not received either orders and need [place] to send both orders again. I notified wife of this as well.   PCCs, can you please resend the order and the HST to [facility] again as they have not received it yet. The fax number we have is not correct and I am updating that. The fax number [facility] gave me was [phone]. Thanks!

## 2020-11-15 DIAGNOSIS — G4733 Obstructive sleep apnea (adult) (pediatric): Secondary | ICD-10-CM | POA: Diagnosis not present

## 2020-11-16 ENCOUNTER — Encounter: Payer: Self-pay | Admitting: Cardiology

## 2020-11-16 ENCOUNTER — Other Ambulatory Visit: Payer: Self-pay

## 2020-11-16 ENCOUNTER — Ambulatory Visit (INDEPENDENT_AMBULATORY_CARE_PROVIDER_SITE_OTHER): Payer: Medicare HMO | Admitting: Cardiology

## 2020-11-16 VITALS — BP 108/58 | HR 70 | Ht 70.5 in | Wt 149.0 lb

## 2020-11-16 DIAGNOSIS — D6869 Other thrombophilia: Secondary | ICD-10-CM | POA: Diagnosis not present

## 2020-11-16 DIAGNOSIS — I25119 Atherosclerotic heart disease of native coronary artery with unspecified angina pectoris: Secondary | ICD-10-CM

## 2020-11-16 DIAGNOSIS — I4891 Unspecified atrial fibrillation: Secondary | ICD-10-CM | POA: Diagnosis not present

## 2020-11-16 DIAGNOSIS — I9789 Other postprocedural complications and disorders of the circulatory system, not elsewhere classified: Secondary | ICD-10-CM

## 2020-11-16 DIAGNOSIS — I255 Ischemic cardiomyopathy: Secondary | ICD-10-CM

## 2020-11-16 DIAGNOSIS — I513 Intracardiac thrombosis, not elsewhere classified: Secondary | ICD-10-CM

## 2020-11-16 NOTE — Patient Instructions (Signed)
Medication Instructions:  Your physician recommends that you continue on your current medications as directed. Please refer to the Current Medication list given to you today.  *If you need a refill on your cardiac medications before your next appointment, please call your pharmacy*   Lab Work: None today  If you have labs (blood work) drawn today and your tests are completely normal, you will receive your results only by: MyChart Message (if you have MyChart) OR A paper copy in the mail If you have any lab test that is abnormal or we need to change your treatment, we will call you to review the results.   Testing/Procedures: None today    Follow-Up: At [facility], you and your health needs are our priority.  As part of our continuing mission to provide you with exceptional heart care, we have created designated Provider Care Teams.  These Care Teams include your primary Cardiologist (physician) and Advanced Practice Providers (APPs -  Physician Assistants and Nurse Practitioners) who all work together to provide you with the care you need, when you need it.  We recommend signing up for the patient portal called "MyChart".  Sign up information is provided on this After Visit Summary.  MyChart is used to connect with patients for Virtual Visits (Telemedicine).  Patients are able to view lab/test results, encounter notes, upcoming appointments, etc.  Non-urgent messages can be sent to your provider as well.   To learn more about what you can do with MyChart, go to [email or web address].    Your next appointment:   3 month(s)  The format for your next appointment:   In Person  Provider:   Samuel McDowell, MD   Other Instructions None   

## 2020-11-16 NOTE — Progress Notes (Signed)
Cardiology Office Note  Date: 11/16/2020   ID: Patrick Miranda, DOB 15-Feb-1946, MRN [id number]  PCP:  Burnard Bunting, MD  Cardiologist:  Rozann Lesches, MD Electrophysiologist:  None   Chief Complaint  Patient presents with  . Cardiac follow-up    History of Present Illness: [place] is a 75 y.o. male last seen in mid April by Ms. Strader PA-C, I reviewed the note.  They with his wife for a follow-up visit.  He looks better, vital signs more stable with decrease in heart rate, he is also picked up a few pounds with better appetite.  I did talk with him about trying to walk more regularly, he does need to be careful however, uses a walker or cane depending on the situation.  He does not report any recent falls.  Reports no angina symptoms, NYHA class II dyspnea with typical activities, no palpitations or syncope.  Toprol-XL was increased to 25 mg twice daily at last visit.  Otherwise his cardiac regimen remained stable as noted below.  He does not report any bleeding problems on Eliquis and low-dose aspirin.  Past Medical History:  Diagnosis Date  . Arthritis   . 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 Cerebral artery disease   . Coronary artery disease    a. s/p CABG in 06/2020 with LIMA-LAD, SVG-D1 and SVG-RCA  . COVID-07 Dec 2019  . Depression   . Essential hypertension   . Hernia, umbilical   . HOH (hard of hearing)   . Hyperlipidemia   . Ischemic cardiomyopathy   . 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 Left ventricular mural thrombus   . NSTEMI (non-ST elevated myocardial infarction) ([place])   . OSA on CPAP   . Pulmonary emboli [facility])    May 2021  . Skin cancer   . Type 2 diabetes mellitus ([place])     Past Surgical History:  Procedure Laterality Date  . COLONOSCOPY W/ POLYPECTOMY    . CORONARY ARTERY BYPASS GRAFT N/A 07/09/2020   Procedure: CORONARY ARTERY BYPASS GRAFTING (CABG) TIMES THREE , USING LEFT INTERNAL MAMMARY ARTERY AND RIGHT LEG  GREATER SAPHENOUS VEIN HARVESTED ENDOSCOPICALLY;  Surgeon: Ivin Poot, MD;  Location: [facility];  Service: Open Heart Surgery;  Laterality: N/A;  . LEFT HEART CATH AND CORONARY ANGIOGRAPHY N/A 07/04/2020   Procedure: LEFT HEART CATH AND CORONARY ANGIOGRAPHY;  Surgeon: Troy Sine, MD;  Location: [facility];  Service: Cardiovascular;  Laterality: N/A;  . TEE WITHOUT CARDIOVERSION N/A 07/09/2020   Procedure: TRANSESOPHAGEAL ECHOCARDIOGRAM (TEE);  Surgeon: Prescott Gum, Collier Salina, MD;  Location: [facility];  Service: Open Heart Surgery;  Laterality: N/A;  . TOTAL KNEE ARTHROPLASTY Left 04/22/2013   Procedure: TOTAL KNEE ARTHROPLASTY- left;  Surgeon: Augustin Schooling, MD;  Location: [facility];  Service: Orthopedics;  Laterality: Left;  with femoral block  . TOTAL KNEE ARTHROPLASTY Right 01/06/2014   Procedure: RIGHT TOTAL KNEE ARTHROPLASTY;  Surgeon: Augustin Schooling, MD;  Location: [facility];  Service: Orthopedics;  Laterality: Right;    Current Outpatient Medications  Medication Sig Dispense Refill  . acetaminophen (TYLENOL) 325 MG tablet Take 2 tablets (650 mg total) by mouth every 6 (six) hours as needed for mild pain or headache (or Fever >/= 101).    Marland Kitchen apixaban (ELIQUIS) 5 MG TABS tablet Take 1 tablet (5 mg total) by mouth 2 (two) times daily. 60 tablet 2  . aspirin EC 81 MG EC  tablet Take 1 tablet (81 mg total) by mouth daily. Swallow whole. 30 tablet 11  . BD PEN NEEDLE NANO U/F 32G X 4 MM MISC 1 each by Other route daily.     . Continuous Blood Gluc Sensor (FREESTYLE LIBRE 2 SENSOR) MISC See admin instructions.    . fenofibrate 160 MG tablet Take 160 mg by mouth daily.    . finasteride (PROSCAR) 5 MG tablet Take 5 mg by mouth daily.    Marland Kitchen HUMALOG KWIKPEN 100 UNIT/ML KwikPen Inject 5-8 Units into the skin with breakfast, with lunch, and with evening meal.    . metFORMIN (GLUCOPHAGE) 500 MG tablet Take 500 mg by mouth 2 (two) times daily with a meal.    . metoprolol succinate (TOPROL XL) 25 MG 24 hr  tablet Take 1 tablet (25 mg total) by mouth in the morning and at bedtime. 180 tablet 3  . ONETOUCH VERIO test strip 1 each by Other route in the morning, at noon, and at bedtime.   3  . rosuvastatin (CRESTOR) 20 MG tablet Take 1 tablet (20 mg total) by mouth daily. 30 tablet 1  . tamsulosin (FLOMAX) 0.4 MG CAPS capsule Take 1 capsule (0.4 mg total) by mouth daily. (Patient taking differently: Take 0.4 mg by mouth at bedtime.) 30 capsule 0  . TOUJEO SOLOSTAR 300 UNIT/ML Solostar Pen Inject 20 Units into the skin daily. (Patient taking differently: Inject 15 Units into the skin daily.)     No current facility-administered medications for this visit.   Allergies:  Lipitor [atorvastatin] and Sitagliptin   ROS: No syncope.  Physical Exam: VS:  BP (!) 108/58   Pulse 70   Ht 5' 10.5" (1.791 m)   Wt 149 lb (67.6 kg)   SpO2 96%   BMI 21.08 kg/m , BMI Body mass index is 21.08 kg/m.  Wt Readings from Last 3 Encounters:  11/16/20 149 lb (67.6 kg)  11/05/20 146 lb 3.2 oz (66.3 kg)  11/01/20 149 lb (67.6 kg)    General: Elderly male, appears comfortable at rest. HEENT: Conjunctiva and lids normal, wearing a mask. Neck: Supple, no elevated JVP or carotid bruits, no thyromegaly. Lungs: Clear to auscultation, nonlabored breathing at rest. Cardiac: Regular rate and rhythm, no S3 or significant systolic murmur. Abdomen: Soft, bowel sounds present, no guarding or rebound. Extremities: No pitting edema.  ECG:  An ECG dated 10/17/2020 was personally reviewed today and demonstrated:  Probable sinus tachycardia with rightward axis and occasional fusion complexes as well as diffuse ST-T wave abnormalities.  Recent Labwork: 07/05/2020: TSH 2.525 08/04/2020: B Natriuretic Peptide 656.9 08/10/2020: Magnesium 1.9 09/11/2020: ALT 19; AST 34; BUN 18; Creatinine, Ser 1.21; Potassium 4.2; Sodium 137 10/17/2020: Hemoglobin 11.2; Platelets 481     Component Value Date/Time   CHOL 65 09/09/2020 0402   TRIG 94  09/09/2020 0402   HDL 21 (L) 09/09/2020 0402   CHOLHDL 3.1 09/09/2020 0402   VLDL 19 09/09/2020 0402   LDLCALC 25 09/09/2020 0402    Other Studies Reviewed Today:  Echocardiogram 09/09/2020: 1. Left ventricular ejection fraction, by estimation, is 40%. LVEF is  depressed with akinesis of the inferior/inferoseptal base with aneurysmal  dilitation. There is also akinesis of the apex. Compared to echo from  January 2022, the apical thrombus is no  longer present. The diastolic parameters are consistent with Grade I  diastolic dysfunction (impaired relaxation).  2. Right ventricular systolic function is normal. The right ventricular  size is normal.  3. Left atrial  size was mildly dilated.  4. The mitral valve is abnormal. Mild mitral valve regurgitation.  5. The aortic valve is normal in structure. Aortic valve regurgitation is  mild.   Echocardiogram 09/13/2020 [facility]): SUMMARY  Mild left ventricular hypertrophy  The left ventricular size is normal.  Left ventricular systolic function is mildly reduced.  LV ejection fraction = 45-50%.  The basal LV inferior wall is akinetic and aneurysmal. The base of the  inferoseptal wall also appears to be so. In the absence of coronary  artery disease, sarcoidosis can have such an appearance. Clinical  correlation is recommended.  The right ventricle is normal in size and function.  The left atrium is mildly dilated.  There is no significant valvular stenosis or regurgitation.  The ascendingaorta is normal size.  The inferior vena cava was not visualized during the exam.  There is no pericardial effusion.  There is no comparison study available.   Assessment and Plan:  1.  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  He remains on low-dose aspirin, Toprol-XL, and Crestor.  2.  Ischemic cardiomyopathy, LVEF approximately 40 to 50%.  No residual LV mural thrombus by most recent imaging in February.  Vital signs are stable  with improvement in heart rate, tolerating Toprol-XL at current dose, no additional room for ARB/ANRI at this point with low normal blood pressure at present not on diuretic therapy, may consider starting low-dose Aldactone next.  3.  Acquired thrombophilia.  Has history of postoperative atrial fibrillation.  CHA2DS2-VASc score is 7.  No reported spontaneous bleeding problems with last hemoglobin 11.2.  Medication Adjustments/Labs and Tests Ordered: Current medicines are reviewed at length with the patient today.  Concerns regarding medicines are outlined above.   Tests Ordered: No orders of the defined types were placed in this encounter.   Medication Changes: No orders of the defined types were placed in this encounter.   Disposition:  Follow up 3 months.  Signed, Satira Sark, MD, [facility] 11/16/2020 1:29 PM    [facility] at [facility] [address] [place], [place] [address] Phone: [phone]; Fax: [phone]

## 2020-11-23 DIAGNOSIS — R531 Weakness: Secondary | ICD-10-CM | POA: Diagnosis not present

## 2020-11-23 DIAGNOSIS — R2689 Other abnormalities of gait and mobility: Secondary | ICD-10-CM | POA: Diagnosis not present

## 2020-11-27 ENCOUNTER — Telehealth: Payer: Self-pay | Admitting: Cardiology

## 2020-11-27 MED ORDER — ROSUVASTATIN CALCIUM 20 MG PO TABS: 20.0000 mg | ORAL_TABLET | Freq: Every day | ORAL | 1 refills | Status: DC

## 2020-11-27 NOTE — Telephone Encounter (Signed)
   1. Which medications need to be refilled? (please list name of each medication and dose if known)   CRESTOR 20 MG   2. Which pharmacy/location (including street and city if local pharmacy) is medication to be sent to?  [facility]   3. Do they need a 30 day or 90 day supply?

## 2020-11-27 NOTE — Telephone Encounter (Signed)
Reflled crestor 20 mg daily #90 RF:1 to [facility]

## 2020-11-28 ENCOUNTER — Other Ambulatory Visit: Payer: Self-pay

## 2020-11-28 NOTE — Patient Outreach (Signed)
[facility]) Care Management  [place]  11/28/2020   [place] [birth year] [id number]  Subjective: Telephone call to wife [name]. She reports that patient  Is doing better after his problems with his stomach.  She reports that he has been home from the facility almost 2 months. She reports patient has some weakness but gets around with a rollator.  She report he can do his bathing a dressing independently now but needs assistance at times.   Patient an spouse live in the home with daughter and granddaughter. Family helps with transportation to appointments.    History of CAD s/p CABG (12/21), Afib (on eliquis), hx PE, CHF (EF40%), CKD3, T2DM on insulin, HTN, HLD, OSA, dementia and recent stroke and colon polypsand past surgical history including CABG and bilateral knee replacements.  Patient doing well managing help problems. Wife reports some dementia which is challenging at times.  Diabetes in managed well with A1c of 5.9. Discussed diabetes management.    Discussed [facility] services and support. Wife is agreeable to [facility] telephonic outreach every other month at this time.    Objective:   Encounter Medications:  Outpatient Encounter Medications as of 11/28/2020  Medication Sig  . acetaminophen (TYLENOL) 325 MG tablet Take 2 tablets (650 mg total) by mouth every 6 (six) hours as needed for mild pain or headache (or Fever >/= 101).  Marland Kitchen apixaban (ELIQUIS) 5 MG TABS tablet Take 1 tablet (5 mg total) by mouth 2 (two) times daily.  Marland Kitchen aspirin EC 81 MG EC tablet Take 1 tablet (81 mg total) by mouth daily. Swallow whole.  . BD PEN NEEDLE NANO U/F 32G X 4 MM MISC 1 each by Other route daily.   . Continuous Blood Gluc Sensor (FREESTYLE LIBRE 2 SENSOR) MISC See admin instructions.  . fenofibrate 160 MG tablet Take 160 mg by mouth daily.  . finasteride (PROSCAR) 5 MG tablet Take 5 mg by mouth daily.  Marland Kitchen HUMALOG KWIKPEN 100 UNIT/ML KwikPen Inject 5-8 Units into the skin with  breakfast, with lunch, and with evening meal.  . metFORMIN (GLUCOPHAGE) 500 MG tablet Take 500 mg by mouth 2 (two) times daily with a meal.  . metoprolol succinate (TOPROL XL) 25 MG 24 hr tablet Take 1 tablet (25 mg total) by mouth in the morning and at bedtime.  . rosuvastatin (CRESTOR) 20 MG tablet Take 1 tablet (20 mg total) by mouth daily.  . tamsulosin (FLOMAX) 0.4 MG CAPS capsule Take 1 capsule (0.4 mg total) by mouth daily. (Patient taking differently: Take 0.4 mg by mouth at bedtime.)  . TOUJEO SOLOSTAR 300 UNIT/ML Solostar Pen Inject 20 Units into the skin daily. (Patient taking differently: Inject 15 Units into the skin daily.)  . ONETOUCH VERIO test strip 1 each by Other route in the morning, at noon, and at bedtime.    No facility-administered encounter medications on file as of 11/28/2020.    Functional Status:  In your present state of health, do you have any difficulty performing the following activities: 11/28/2020 09/10/2020  Hearing? Y Y  Comment no hearing aids -  Vision? N N  Difficulty concentrating or making decisions? Y Y  Comment dementia -  Walking or climbing stairs? Y Y  Comment some weakness -  Dressing or bathing? Tempie Donning  Comment wife assists at times. -  Doing errands, shopping? Tempie Donning  Comment wife assists -  Conservation officer, nature and eating ? Y -  Using the Toilet? N -  In the past six months, have you accidently leaked urine? N -  Do you have problems with loss of bowel control? N -  Managing your Medications? Y -  Comment wife helps -  Managing your Finances? Y -  Comment wife helps -  Housekeeping or managing your Housekeeping? Y -  Comment wife helps -  Some recent data might be hidden    Fall/Depression Screening: Fall Risk  11/28/2020 01/19/2020 12/20/2019  Falls in the past year? 0 0 0  Number falls in past yr: - 0 0  Injury with Fall? - 0 0  Comment - N/A- no falls reported N/A- no falls reported  Risk for fall due to : - No Fall Risks Medication side  effect;Other (Comment)  Risk for fall due to: Comment - - weakness, post-hospital discharge  Follow up - Falls prevention discussed Falls prevention discussed   PHQ 2/9 Scores 11/28/2020 01/19/2020 12/20/2019  PHQ - 2 Score - 0 1  Exception Documentation Other- indicate reason in comment box - -  Not completed Wife [name] does assessment - -    Assessment:  Goals Addressed            This Visit's Progress   . Make and Keep All Appointments       Barriers: Knowledge Timeframe:  Long-Range Goal Priority:  High Start Date:     11/28/20                        Expected End Date:   12/32/22                    Follow Up Date 02/17/21   - arrange a ride through an agency 1 week before appointment    Why is this important?    Part of staying healthy is seeing the doctor for follow-up care.   If you forget your appointments, there are some things you can do to stay on track.    Notes: 11/28/20 no problems with transportation.      . Monitor and Manage My Blood Sugar-Diabetes Type 2       Barriers: Knowledge Timeframe:  Long-Range Goal Priority:  High Start Date:   11/28/20                          Expected End Date: 07/20/21                     Follow Up Date 02/17/21   - check blood sugar at prescribed times - take the blood sugar log to all doctor visits    Why is this important?    Checking your blood sugar at home helps to keep it from getting very high or very low.   Writing the results in a diary or log helps the doctor know how to care for you.   Your blood sugar log should have the time, date and the results.   Also, write down the amount of insulin or other medicine that you take.   Other information, like what you ate, exercise done and how you were feeling, will also be helpful.     Notes: 11/28/20 Continue to monitor blood sugars.  A1c 5.9       Plan: RN CM will provide ongoing education and support to caregiver/patient through phone calls.   RN CM will send  welcome packet with consent to caregiver/patient.  RN CM will send initial barriers letter, assessment, and care plan to primary care physician.   RN CM will contact in the month of July. Follow-up:  Patient agrees to Care Plan and Follow-up.   Jone Baseman, RN, MSN [place] [facility] Care Management Coordinator Direct Line [phone] Cell [phone] Toll Free: [phone]  Fax: [phone]

## 2020-11-28 NOTE — Patient Instructions (Signed)
Goals    . Make and Keep All Appointments     Barriers: Knowledge Timeframe:  Long-Range Goal Priority:  High Start Date:     11/28/20                        Expected End Date:   12/32/22                    Follow Up Date 02/17/21   - arrange a ride through an agency 1 week before appointment    Why is this important?    Part of staying healthy is seeing the doctor for follow-up care.   If you forget your appointments, there are some things you can do to stay on track.    Notes: 11/28/20 no problems with transportation.      . Monitor and Manage My Blood Sugar-Diabetes Type 2     Barriers: Knowledge Timeframe:  Long-Range Goal Priority:  High Start Date:   11/28/20                          Expected End Date: 07/20/21                     Follow Up Date 02/17/21   - check blood sugar at prescribed times - take the blood sugar log to all doctor visits    Why is this important?    Checking your blood sugar at home helps to keep it from getting very high or very low.   Writing the results in a diary or log helps the doctor know how to care for you.   Your blood sugar log should have the time, date and the results.   Also, write down the amount of insulin or other medicine that you take.   Other information, like what you ate, exercise done and how you were feeling, will also be helpful.     Notes: 11/28/20 Continue to monitor blood sugars.  A1c 5.9

## 2020-11-29 DIAGNOSIS — E1129 Type 2 diabetes mellitus with other diabetic kidney complication: Secondary | ICD-10-CM | POA: Diagnosis not present

## 2020-12-06 ENCOUNTER — Ambulatory Visit (INDEPENDENT_AMBULATORY_CARE_PROVIDER_SITE_OTHER): Payer: Medicare HMO | Admitting: Neurology

## 2020-12-06 ENCOUNTER — Other Ambulatory Visit: Payer: Self-pay

## 2020-12-06 ENCOUNTER — Other Ambulatory Visit: Payer: Self-pay | Admitting: Neurology

## 2020-12-06 ENCOUNTER — Encounter: Payer: Self-pay | Admitting: Neurology

## 2020-12-06 VITALS — BP 116/63 | HR 93 | Ht 70.5 in | Wt 155.2 lb

## 2020-12-06 DIAGNOSIS — F0150 Vascular dementia without behavioral disturbance: Secondary | ICD-10-CM | POA: Diagnosis not present

## 2020-12-06 DIAGNOSIS — Z113 Encounter for screening for infections with a predominantly sexual mode of transmission: Secondary | ICD-10-CM | POA: Diagnosis not present

## 2020-12-06 DIAGNOSIS — R7989 Other specified abnormal findings of blood chemistry: Secondary | ICD-10-CM | POA: Diagnosis not present

## 2020-12-06 DIAGNOSIS — Z79899 Other long term (current) drug therapy: Secondary | ICD-10-CM | POA: Diagnosis not present

## 2020-12-06 DIAGNOSIS — R799 Abnormal finding of blood chemistry, unspecified: Secondary | ICD-10-CM | POA: Diagnosis not present

## 2020-12-06 DIAGNOSIS — R413 Other amnesia: Secondary | ICD-10-CM | POA: Diagnosis not present

## 2020-12-06 DIAGNOSIS — E538 Deficiency of other specified B group vitamins: Secondary | ICD-10-CM | POA: Diagnosis not present

## 2020-12-06 DIAGNOSIS — Z8673 Personal history of transient ischemic attack (TIA), and cerebral infarction without residual deficits: Secondary | ICD-10-CM | POA: Diagnosis not present

## 2020-12-06 MED ORDER — MEMANTINE HCL 10 MG PO TABS: 10.0000 mg | ORAL_TABLET | Freq: Two times a day (BID) | ORAL | 1 refills | Status: DC

## 2020-12-06 MED ORDER — MEMANTINE HCL 28 X 5 MG & 21 X 10 MG PO TABS: ORAL_TABLET | ORAL | 12 refills | Status: DC

## 2020-12-06 NOTE — Progress Notes (Signed)
[facility] [address] [place]. [place] [address] [phone]       OFFICE CONSULT NOTE  [name] Date of Birth:  [birth year] Medical Record Number:  [id number]   Referring MD: Dr. Erlinda Hong Reason for Referral: Dementia  HPI: Patrick Miranda is a 75 Caucasian male seen today for initial office consultation visit following hospital admission for confusion.  History is obtained from the patient and wife and review of electronic medical records and I personally reviewed available pertinent imaging films in PACS.  He has past medical history of right pontine stroke in 2013, coronary artery disease s/p CABG December 21, hypertension, hyperlipidemia, cardiomyopathy, left ventricular thrombus and pulmonary emboli.  Patient was admitted to [facility] in February 2022 for altered mental status for couple of weeks.  He had some odd behavior and was confused on multiple occasions.  CT scan of the head was showed suspected subacute left frontal and old right parietal infarcts.  MRI scan showed some T2 shine through but no acute infarct and dislocations.  2D echo showed ejection fraction of 40% with a kinesis of the inferior and inferior septal base with aneurysmal dilatation.  This was improved compared with previous echo from January 2022 when there was apical thrombus which was no longer seen.  EEG showed mild diffuse slowing suggestive of encephalopathy but no seizures.  LDL cholesterol 25 mg percent.  Hemoglobin A1c was 6.8.  Patient had been on Coumadin which she had a tough time taking and was switched to Eliquis which she is not tolerating much better without bruising or bleeding.  Continues to have memory loss and cognitive impairment which is unchanged.  Patient not been evaluated for reversible causes of cognitive impairment and not been tried on medication like Aricept or Namenda yet.  Patient had a remote history of a right pontine stroke in 2013 and had only mild  speech difficulties which resolved completely.  He had no other strokes due to his recent cardiac surgery in December.  He had transient postop A. fib as well and a left ventricular thrombus on the echo which has since resolved.  There is no family history of Alzheimer's.  Patient has no history of seizures or significant head injury with loss of consciousness.  Patient's cognitive impairment is mostly related to short-term memory and remembering recent information.  He still mostly independent in activities of daily living and can attend to his own bodily needs.  He does get frustrated easily but there is no agitation, violent behavior, delusions or hallucinations.  He has not exhibited any unsafe behavior and does not wander off.  His balance is poor and he does use a cane and he fell once in the bathroom a month ago does not fall on a regular basis.  ROS:   14 system review of systems is positive for memory loss, imbalance, falls, confusion, imbalance agitation all other systems negative PMH:  Past Medical History:  Diagnosis Date  . Arthritis   . 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 Cerebral artery disease   . Coronary artery disease    a. s/p CABG in 06/2020 with LIMA-LAD, SVG-D1 and SVG-RCA  . COVID-07 Dec 2019  . Depression   . Essential hypertension   . Hernia, umbilical   . HOH (hard of hearing)   . Hyperlipidemia   . Ischemic cardiomyopathy   . 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 Left ventricular mural thrombus   .  NSTEMI (non-ST elevated myocardial infarction) ([place])   . OSA on CPAP   . Pulmonary emboli [facility])    May 2021  . Skin cancer   . Type 2 diabetes mellitus ([place])     Social History:  Social History   Socioeconomic History  . Marital status: Married    Spouse name: Patrick Miranda  . Number of children: Not on file  . Years of education: Not on file  . Highest education level: Not on file  Occupational History  . Not on file  Tobacco Use  . Smoking status:  Never Smoker  . Smokeless tobacco: Never Used  Vaping Use  . Vaping Use: Never used  Substance and Sexual Activity  . Alcohol use: No  . Drug use: No  . Sexual activity: Not on file  Other Topics Concern  . Not on file  Social History Narrative   Lives with wife at Daughter's home   Right Handed   Drinks very little caffeine daily   Social Determinants of Health   Financial Resource Strain: Not on file  Food Insecurity: No Food Insecurity  . Worried About Charity fundraiser in the Last Year: Never true  . Ran Out of Food in the Last Year: Never true  Transportation Needs: No Transportation Needs  . Lack of Transportation (Medical): No  . Lack of Transportation (Non-Medical): No  Physical Activity: Not on file  Stress: Not on file  Social Connections: Not on file  Intimate Partner Violence: Not on file    Medications:   Current Outpatient Medications on File Prior to Visit  Medication Sig Dispense Refill  . acetaminophen (TYLENOL) 325 MG tablet Take 2 tablets (650 mg total) by mouth every 6 (six) hours as needed for mild pain or headache (or Fever >/= 101).    Marland Kitchen apixaban (ELIQUIS) 5 MG TABS tablet Take 1 tablet (5 mg total) by mouth 2 (two) times daily. 60 tablet 2  . aspirin EC 81 MG EC tablet Take 1 tablet (81 mg total) by mouth daily. Swallow whole. 30 tablet 11  . BD PEN NEEDLE NANO U/F 32G X 4 MM MISC 1 each by Other route daily.     . Continuous Blood Gluc Sensor (FREESTYLE LIBRE 2 SENSOR) MISC See admin instructions.    . fenofibrate 160 MG tablet Take 160 mg by mouth daily.    . finasteride (PROSCAR) 5 MG tablet Take 5 mg by mouth daily.    Marland Kitchen HUMALOG KWIKPEN 100 UNIT/ML KwikPen Inject 5-8 Units into the skin with breakfast, with lunch, and with evening meal.    . metFORMIN (GLUCOPHAGE) 500 MG tablet Take 500 mg by mouth 2 (two) times daily with a meal.    . metoprolol succinate (TOPROL XL) 25 MG 24 hr tablet Take 1 tablet (25 mg total) by mouth in the morning and at  bedtime. 180 tablet 3  . ONETOUCH VERIO test strip 1 each by Other route in the morning, at noon, and at bedtime.   3  . rosuvastatin (CRESTOR) 20 MG tablet Take 1 tablet (20 mg total) by mouth daily. 90 tablet 1  . tamsulosin (FLOMAX) 0.4 MG CAPS capsule Take 1 capsule (0.4 mg total) by mouth daily. (Patient taking differently: Take 0.4 mg by mouth at bedtime.) 30 capsule 0  . TOUJEO SOLOSTAR 300 UNIT/ML Solostar Pen Inject 20 Units into the skin daily. (Patient taking differently: Inject 15 Units into the skin daily.)     No current facility-administered medications on  file prior to visit.    Allergies:   Allergies  Allergen Reactions  . Lipitor [Atorvastatin] Other (See Comments)    Muscle pain  . Sitagliptin     Other reaction(s): myalgias    Physical Exam General: well developed, well nourished, seated, in no evident distress Head: head normocephalic and atraumatic.   Neck: supple with no carotid or supraclavicular bruits Cardiovascular: regular rate and rhythm, no murmurs Musculoskeletal: no deformity Skin:  no rash/petichiae Vascular:  Normal pulses all extremities  Neurologic Exam Mental Status: Awake and fully alert. Oriented to place and time. Recent and remote memory diminished. Attention span, concentration and fund of knowledge poor mood and affect appropriate.  Mini-Mental status exam scored 22/30 with deficits in recall, orientation attention calculation reading and writing.  Clock drawing 3/4.  Able to copy intersecting pentagons.  Able to name only 4 animals which can walk on 4 legs. Cranial Nerves: Fundoscopic exam reveals sharp disc margins. Pupils equal, briskly reactive to light. Extraocular movements full without nystagmus. Visual fields full to confrontation. Hearing significantly diminished bilaterally facial sensation intact. Face, tongue, palate moves normally and symmetrically.  Motor: Normal bulk and tone. Normal strength in all tested extremity muscles  except mild weakness of bilateral grip and hip flexors.. Sensory.: intact to touch , pinprick , position and vibratory sensation.  Coordination: Rapid alternating movements normal in all extremities. Finger-to-nose and heel-to-shin performed accurately bilaterally. Gait and Station: Arises from chair with slight difficulty. Stance is stooped l. Gait demonstrates normal stride length and only slight imbalance .  Not able to heel, toe and tandem walk    Reflexes: 1+ and symmetric. Toes downgoing.   NIHSS  1 Modified Rankin  2   ASSESSMENT: 75 year old Caucasian male with cognitive deterioration and memory loss for the last for 5 months following cardiac surgery likely due to mild vascular dementia.  History of remote right pontine infarct in 2013 and suspect left frontal and right parietal infarcts following cardiac surgery in December 2021.  Vascular risk factors of coronary artery disease, left ventricular thrombus, perioperative atrial fibrillation, diabetes, hypertension, hyperlipidemia and age     PLAN: I had a long discussion with the patient and his wife regarding his mild vascular dementia which is likely a combination of his previous stroke as well as strokes during cardiac surgery and answered questions.  I recommend we check lab work for reversible causes of memory loss as well as EEG and a trial of Namenda.  Start with a starter pack and increase as tolerated to 10 mg twice daily.  I discussed possible side effects with patient and wife and advised him to call me if needed.  I also encouraged him to increase participation in cognitively challenging activities like solving crossword puzzles, playing bridge and sodoku.  We also discussed memory compensation strategies.  He will continue on Eliquis for secondary stroke prevention and maintain aggressive risk factor modification with strict control of hypertension with blood pressure goal below 130/90, lipids with LDL cholesterol goal below 70  mg percent and diabetes with hemoglobin A1c goal below 6.5%.  He was advised to use his cane at all times and we discussed fall prevention precautions.  He will return for follow-up in the future in 2 months or call earlier if necessary.  Greater than 50% time during this 45-minute consultation visit was spent in counseling and coordination of care about is memory loss and vascular dementia as well as discussion about stroke prevention and treatment of dementia and  answering questions Antony Contras, MD Note: This document was prepared with digital dictation and possible smart phrase technology. Any transcriptional errors that result from this process are unintentional.

## 2020-12-06 NOTE — Patient Instructions (Signed)
I had a long discussion with the patient and his wife regarding his mild vascular dementia which is likely a combination of his previous stroke as well as strokes during cardiac surgery and answered questions.  I recommend we check lab work for reversible causes of memory loss as well as EEG and a trial of Namenda.  Start with a starter pack and increase as tolerated to 10 mg twice daily.  I discussed possible side effects with patient and wife and advised him to call me if needed.  I also encouraged him to increase participation in cognitively challenging activities like solving crossword puzzles, playing bridge and sodoku.  We also discussed memory compensation strategies.  He will continue on Eliquis for secondary stroke prevention and maintain aggressive risk factor modification with strict control of hypertension with blood pressure goal below 130/90, lipids with LDL cholesterol goal below 70 mg percent and diabetes with hemoglobin A1c goal below 6.5%.  He was advised to use his cane at all times and we discussed fall prevention precautions.  He will return for follow-up in the future in 2 months or call earlier if necessary. Memory Compensation Strategies  1. Use "WARM" strategy.  W= write it down  A= associate it  R= repeat it  M= make a mental note  2.   You can keep a Social worker.  Use a 3-ring notebook with sections for the following: calendar, important names and phone numbers,  medications, doctors' names/phone numbers, lists/reminders, and a section to journal what you did  each day.   3.    Use a calendar to write appointments down.  4.    Write yourself a schedule for the day.  This can be placed on the calendar or in a separate section of the Memory Notebook.  Keeping a  regular schedule can help memory.  5.    Use medication organizer with sections for each day or morning/evening pills.  You may need help loading it  6.    Keep a basket, or pegboard by the door.  Place items  that you need to take out with you in the basket or on the pegboard.  You may also want to  include a message board for reminders.  7.    Use sticky notes.  Place sticky notes with reminders in a place where the task is performed.  For example: " turn off the  stove" placed by the stove, "lock the door" placed on the door at eye level, " take your medications" on  the bathroom mirror or by the place where you normally take your medications.  8.    Use alarms/timers.  Use while cooking to remind yourself to check on food or as a reminder to take your medicine, or as a  reminder to make a call, or as a reminder to perform another task, etc.  Fall Prevention in the Home, Adult Falls can cause injuries and can happen to people of all ages. There are many things you can do to make your home safe and to help prevent falls. Ask for help when making these changes. What actions can I take to prevent falls? General Instructions  Use good lighting in all rooms. Replace any light bulbs that burn out.  Turn on the lights in dark areas. Use night-lights.  Keep items that you use often in easy-to-reach places. Lower the shelves around your home if needed.  Set up your furniture so you have a clear path. Avoid moving  your furniture around.  Do not have throw rugs or other things on the floor that can make you trip.  Avoid walking on wet floors.  If any of your floors are uneven, fix them.  Add color or contrast paint or tape to clearly mark and help you see: ? Grab bars or handrails. ? First and last steps of staircases. ? Where the edge of each step is.  If you use a stepladder: ? Make sure that it is fully opened. Do not climb a closed stepladder. ? Make sure the sides of the stepladder are locked in place. ? Ask someone to hold the stepladder while you use it.  Know where your pets are when moving through your home. What can I do in the bathroom?  Keep the floor dry. Clean up any water on  the floor right away.  Remove soap buildup in the tub or shower.  Use nonskid mats or decals on the floor of the tub or shower.  Attach bath mats securely with double-sided, nonslip rug tape.  If you need to sit down in the shower, use a plastic, nonslip stool.  Install grab bars by the toilet and in the tub and shower. Do not use towel bars as grab bars.      What can I do in the bedroom?  Make sure that you have a light by your bed that is easy to reach.  Do not use any sheets or blankets for your bed that hang to the floor.  Have a firm chair with side arms that you can use for support when you get dressed. What can I do in the kitchen?  Clean up any spills right away.  If you need to reach something above you, use a step stool with a grab bar.  Keep electrical cords out of the way.  Do not use floor polish or wax that makes floors slippery. What can I do with my stairs?  Do not leave any items on the stairs.  Make sure that you have a light switch at the top and the bottom of the stairs.  Make sure that there are handrails on both sides of the stairs. Fix handrails that are broken or loose.  Install nonslip stair treads on all your stairs.  Avoid having throw rugs at the top or bottom of the stairs.  Choose a carpet that does not hide the edge of the steps on the stairs.  Check carpeting to make sure that it is firmly attached to the stairs. Fix carpet that is loose or worn. What can I do on the outside of my home?  Use bright outdoor lighting.  Fix the edges of walkways and driveways and fix any cracks.  Remove anything that might make you trip as you walk through a door, such as a raised step or threshold.  Trim any bushes or trees on paths to your home.  Check to see if handrails are loose or broken and that both sides of all steps have handrails.  Install guardrails along the edges of any raised decks and porches.  Clear paths of anything that can  make you trip, such as tools or rocks.  Have leaves, snow, or ice cleared regularly.  Use sand or salt on paths during winter.  Clean up any spills in your garage right away. This includes grease or oil spills. What other actions can I take?  Wear shoes that: ? Have a low heel. Do not wear high  heels. ? Have rubber bottoms. ? Feel good on your feet and fit well. ? Are closed at the toe. Do not wear open-toe sandals.  Use tools that help you move around if needed. These include: ? Canes. ? Walkers. ? Scooters. ? Crutches.  Review your medicines with your doctor. Some medicines can make you feel dizzy. This can increase your chance of falling. Ask your doctor what else you can do to help prevent falls. Where to find more information  Centers for Disease Control and Prevention, [employer]: http://www.wolf.info/  National Institute on Aging: http://kim-miller.com/ Contact a doctor if:  You are afraid of falling at home.  You feel weak, drowsy, or dizzy at home.  You fall at home. Summary  There are many simple things that you can do to make your home safe and to help prevent falls.  Ways to make your home safe include removing things that can make you trip and installing grab bars in the bathroom.  Ask for help when making these changes in your home. This information is not intended to replace advice given to you by your health care provider. Make sure you discuss any questions you have with your health care provider. Document Revised: 02/08/2020 Document Reviewed: 02/08/2020 Elsevier Patient Education  [employer].

## 2020-12-07 LAB — DEMENTIA PANEL
Homocysteine: 13.3 umol/L (ref 0.0–19.2)
RPR Ser Ql: NONREACTIVE
TSH: 3.43 u[IU]/mL (ref 0.450–4.500)
Vitamin B-12: 153 pg/mL — ABNORMAL LOW (ref 232–1245)

## 2020-12-07 NOTE — Progress Notes (Signed)
Kindly inform the patient that vitamin B12 levels are quite low and he needs to see primary care physician to start replacement.  Thyroid hormone levels and test for syphilis normal.

## 2020-12-09 DIAGNOSIS — R2689 Other abnormalities of gait and mobility: Secondary | ICD-10-CM | POA: Diagnosis not present

## 2020-12-09 DIAGNOSIS — I509 Heart failure, unspecified: Secondary | ICD-10-CM | POA: Diagnosis not present

## 2020-12-09 DIAGNOSIS — N183 Chronic kidney disease, stage 3 unspecified: Secondary | ICD-10-CM | POA: Diagnosis not present

## 2020-12-19 ENCOUNTER — Ambulatory Visit (INDEPENDENT_AMBULATORY_CARE_PROVIDER_SITE_OTHER): Payer: Medicare HMO | Admitting: Neurology

## 2020-12-19 ENCOUNTER — Other Ambulatory Visit: Payer: Self-pay

## 2020-12-19 DIAGNOSIS — R413 Other amnesia: Secondary | ICD-10-CM

## 2020-12-19 DIAGNOSIS — R410 Disorientation, unspecified: Secondary | ICD-10-CM | POA: Diagnosis not present

## 2020-12-19 DIAGNOSIS — F0150 Vascular dementia without behavioral disturbance: Secondary | ICD-10-CM

## 2020-12-24 ENCOUNTER — Telehealth: Payer: Self-pay | Admitting: Emergency Medicine

## 2020-12-24 NOTE — Telephone Encounter (Signed)
Called and reached patient's wife. Discussed Dr. Clydene Fake review and findings of blood work.  She stated she would contact Dr. Reynaldo Minium to start the B12 replacement.  Patient denied further questions, verbalized understanding and expressed appreciation for the phone call.

## 2020-12-24 NOTE — Telephone Encounter (Signed)
-----   Message from Garvin Fila, MD sent at 12/07/2020  8:53 AM EDT ----- Mitchell Heir inform the patient that vitamin B12 levels are quite low and he needs to see primary care physician to start replacement.  Thyroid hormone levels and test for syphilis normal.

## 2020-12-26 ENCOUNTER — Ambulatory Visit (INDEPENDENT_AMBULATORY_CARE_PROVIDER_SITE_OTHER): Payer: Medicare HMO | Admitting: Cardiology

## 2020-12-26 ENCOUNTER — Encounter: Payer: Self-pay | Admitting: Cardiology

## 2020-12-26 VITALS — BP 118/62 | HR 87 | Ht 70.5 in | Wt 162.4 lb

## 2020-12-26 DIAGNOSIS — I255 Ischemic cardiomyopathy: Secondary | ICD-10-CM

## 2020-12-26 DIAGNOSIS — I25119 Atherosclerotic heart disease of native coronary artery with unspecified angina pectoris: Secondary | ICD-10-CM

## 2020-12-26 DIAGNOSIS — Z79899 Other long term (current) drug therapy: Secondary | ICD-10-CM

## 2020-12-26 MED ORDER — SPIRONOLACTONE 25 MG PO TABS: 12.5000 mg | ORAL_TABLET | Freq: Every day | ORAL | 1 refills | Status: DC

## 2020-12-26 NOTE — Patient Instructions (Addendum)
Medication Instructions:   Your physician has recommended you make the following change in your medication:   Start spironolactone 12.5 mg by mouth daily  Continue other medications the same  Labwork:  Your physician recommends that you return for lab work in: 2 weeks to check your BMET. This can be done at [facility]  Testing/Procedures:  none  Follow-Up:  Your physician recommends that you schedule a follow-up appointment in: 4-6 weeks at the [facility] office  Any Other Special Instructions Will Be Listed Below (If Applicable).  If you need a refill on your cardiac medications before your next appointment, please call your pharmacy.

## 2020-12-26 NOTE — Progress Notes (Signed)
Cardiology Office Note  Date: 12/26/2020   ID: Patrick Miranda, DOB 09-Dec-1945, MRN [id number]  PCP:  Patrick Bunting, MD  Cardiologist:  Patrick Lesches, MD Electrophysiologist:  None   Chief Complaint  Patient presents with  . Hand and leg swelling    History of Present Illness: Patrick Miranda is a 75 y.o. male last seen in April.  He is here today for a follow-up visit.  Reports having trouble with predominantly left hand and bilateral lower leg swelling, least over the last week.  No pain associated, no obvious injury.  Still with fairly low energy but no angina symptoms, NYHA class II dyspnea at baseline.  He sleeps a lot.  Also on medications to help with his memory per neurology.  I reviewed his medications, we discussed addition of Aldactone 12.5 mg daily.  Hopefully can continue to adjust his cardiomyopathy regimen depending on blood pressure and clinical response.  Past Medical History:  Diagnosis Date  . Arthritis   . 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 Cerebral artery disease   . Coronary artery disease    a. s/p CABG in 06/2020 with LIMA-LAD, SVG-D1 and SVG-RCA  . COVID-07 Dec 2019  . Depression   . Essential hypertension   . Hernia, umbilical   . HOH (hard of hearing)   . Hyperlipidemia   . Ischemic cardiomyopathy   . Ischemic necrosis of small bowel ([place])   . Left ventricular mural thrombus   . NSTEMI (non-ST elevated myocardial infarction) ([place])   . OSA on CPAP   . Pulmonary emboli [facility]    May 2021  . Skin cancer   . Type 2 diabetes mellitus ([place])     Past Surgical History:  Procedure Laterality Date  . COLONOSCOPY W/ POLYPECTOMY    . CORONARY ARTERY BYPASS GRAFT N/A 07/09/2020   Procedure: CORONARY ARTERY BYPASS GRAFTING (CABG) TIMES THREE , USING LEFT INTERNAL MAMMARY ARTERY AND RIGHT LEG GREATER SAPHENOUS VEIN HARVESTED ENDOSCOPICALLY;  Surgeon: Patrick Poot, MD;  Location: [place];  Service: Open Heart Surgery;  Laterality: N/A;   . LEFT HEART CATH AND CORONARY ANGIOGRAPHY N/A 07/04/2020   Procedure: LEFT HEART CATH AND CORONARY ANGIOGRAPHY;  Surgeon: Patrick Sine, MD;  Location: [place] [facility];  Service: Cardiovascular;  Laterality: N/A;  . TEE WITHOUT CARDIOVERSION N/A 07/09/2020   Procedure: TRANSESOPHAGEAL ECHOCARDIOGRAM (TEE);  Surgeon: Patrick Miranda, Patrick Salina, MD;  Location: [place];  Service: Open Heart Surgery;  Laterality: N/A;  . TOTAL KNEE ARTHROPLASTY Left 04/22/2013   Procedure: TOTAL KNEE ARTHROPLASTY- left;  Surgeon: Augustin Schooling, MD;  Location: [place];  Service: Orthopedics;  Laterality: Left;  with femoral block  . TOTAL KNEE ARTHROPLASTY Right 01/06/2014   Procedure: RIGHT TOTAL KNEE ARTHROPLASTY;  Surgeon: Augustin Schooling, MD;  Location: [place];  Service: Orthopedics;  Laterality: Right;    Current Outpatient Medications  Medication Sig Dispense Refill  . acetaminophen (TYLENOL) 325 MG tablet Take 2 tablets (650 mg total) by mouth every 6 (six) hours as needed for mild pain or headache (or Fever >/= 101).    Marland Kitchen apixaban (ELIQUIS) 5 MG TABS tablet Take 1 tablet (5 mg total) by mouth 2 (two) times daily. 60 tablet 2  . aspirin EC 81 MG EC tablet Take 1 tablet (81 mg total) by mouth daily. Swallow whole. 30 tablet 11  . BD PEN NEEDLE NANO U/F 32G X 4 MM MISC 1 each by Other route  daily.     . Continuous Blood Gluc Sensor (FREESTYLE LIBRE 2 SENSOR) MISC See admin instructions.    . fenofibrate 160 MG tablet Take 160 mg by mouth daily.    . finasteride (PROSCAR) 5 MG tablet Take 5 mg by mouth daily.    Marland Kitchen HUMALOG KWIKPEN 100 UNIT/ML KwikPen Inject 5-8 Units into the skin with breakfast, with lunch, and with evening meal.    . memantine (NAMENDA TITRATION PAK) tablet pack 5 mg/day for =1 week; 5 mg twice daily for =1 week; 15 mg/day given in 5 mg and 10 mg separated doses for =1 week; then 10 mg twice daily 49 tablet 12  . memantine (NAMENDA) 10 MG tablet Take 1 tablet (10 mg total) by mouth 2 (two) times daily.  Start only after patient finishes namenda starter pack 60 tablet 1  . metFORMIN (GLUCOPHAGE) 500 MG tablet Take 500 mg by mouth 2 (two) times daily with a meal.    . metoprolol succinate (TOPROL XL) 25 MG 24 hr tablet Take 1 tablet (25 mg total) by mouth in the morning and at bedtime. 180 tablet 3  . ONETOUCH VERIO test strip 1 each by Other route in the morning, at noon, and at bedtime.   3  . rosuvastatin (CRESTOR) 20 MG tablet Take 1 tablet (20 mg total) by mouth daily. 90 tablet 1  . spironolactone (ALDACTONE) 25 MG tablet Take 0.5 tablets (12.5 mg total) by mouth daily. 45 tablet 1  . tamsulosin (FLOMAX) 0.4 MG CAPS capsule Take 1 capsule (0.4 mg total) by mouth daily. (Patient taking differently: Take 0.4 mg by mouth at bedtime.) 30 capsule 0  . TOUJEO SOLOSTAR 300 UNIT/ML Solostar Pen Inject 20 Units into the skin daily. (Patient taking differently: Inject 15 Units into the skin daily.)     No current facility-administered medications for this visit.   Allergies:  Lipitor [atorvastatin] and Sitagliptin   ROS: No syncope.  Physical Exam: VS:  BP 118/62   Pulse 87   Ht 5' 10.5" (1.791 m)   Wt 162 lb 6.4 oz (73.7 kg)   SpO2 95%   BMI 22.97 kg/m , BMI Body mass index is 22.97 kg/m.  Wt Readings from Last 3 Encounters:  12/26/20 162 lb 6.4 oz (73.7 kg)  12/06/20 155 lb 3.2 oz (70.4 kg)  11/16/20 149 lb (67.6 kg)    General: Elderly male, appears comfortable at rest. HEENT: Conjunctiva and lids normal, wearing a mask. Neck: Supple, no elevated JVP or carotid bruits, no thyromegaly. Lungs: Clear to auscultation, nonlabored breathing at rest. Cardiac: Regular rate and rhythm, no S3 or significant systolic murmur, no pericardial rub. Extremities: 2+ lower leg edema.  Predominantly left hand edema.    ECG:  An ECG dated 10/17/2020 was personally reviewed today and demonstrated:  Probable sinus tachycardia with rightward axis and occasional fusion complexes as well as diffuse ST-T  wave abnormalities.  Recent Labwork: 08/04/2020: B Natriuretic Peptide 656.9 08/10/2020: Magnesium 1.9 09/11/2020: ALT 19; AST 34; BUN 18; Creatinine, Ser 1.21; Potassium 4.2; Sodium 137 10/17/2020: Hemoglobin 11.2; Platelets 481 12/06/2020: TSH 3.430     Component Value Date/Time   CHOL 65 09/09/2020 0402   TRIG 94 09/09/2020 0402   HDL 21 (L) 09/09/2020 0402   CHOLHDL 3.1 09/09/2020 0402   VLDL 19 09/09/2020 0402   LDLCALC 25 09/09/2020 0402    Other Studies Reviewed Today:  Echocardiogram 09/09/2020: 1. Left ventricular ejection fraction, by estimation, is 40%. LVEF is  depressed  with akinesis of the inferior/inferoseptal base with aneurysmal  dilitation. There is also akinesis of the apex. Compared to echo from  January 2022, the apical thrombus is no  longer present. The diastolic parameters are consistent with Grade I  diastolic dysfunction (impaired relaxation).  2. Right ventricular systolic function is normal. The right ventricular  size is normal.  3. Left atrial size was mildly dilated.  4. The mitral valve is abnormal. Mild mitral valve regurgitation.  5. The aortic valve is normal in structure. Aortic valve regurgitation is  mild.   Echocardiogram 09/13/2020([facility]): SUMMARY  Mild left ventricular hypertrophy  The left ventricular size is normal.  Left ventricular systolic function is mildly reduced.  LV ejection fraction = 45-50%.  The basal LV inferior wall is akinetic and aneurysmal. The base of the  inferoseptal wall also appears to be so. In the absence of coronary  artery disease, sarcoidosis can have such an appearance. Clinical  correlation is recommended.  The right ventricle is normal in size and function.  The left atrium is mildly dilated.  There is no significant valvular stenosis or regurgitation.  The ascendingaorta is normal size.  The inferior vena cava was not visualized during the exam.  There is no pericardial effusion.  There is no  comparison study available.   Assessment and Plan:  1.  Ischemic cardiomyopathy, LVEF 40 to 50% range with no residual LV mural thrombus by his last echocardiogram in February.  Continue Toprol-XL, add Aldactone 12.5 mg daily.  Hopefully this will help with his swelling discussed above.  Check BMET in 2 weeks.  Continue to titrate therapy as blood pressure allows.  2.  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  He remains on aspirin and Crestor, no active angina.  3.  Acquired thrombophilia, history of paroxysmal atrial fibrillation with CHA2DS2-VASc score of 7.  He continues on Eliquis for stroke prophylaxis.  Medication Adjustments/Labs and Tests Ordered: Current medicines are reviewed at length with the patient today.  Concerns regarding medicines are outlined above.   Tests Ordered: Orders Placed This Encounter  Procedures  . Basic metabolic panel    Medication Changes: Meds ordered this encounter  Medications  . spironolactone (ALDACTONE) 25 MG tablet    Sig: Take 0.5 tablets (12.5 mg total) by mouth daily.    Dispense:  45 tablet    Refill:  1    12/26/2020 NEW    Disposition:  Follow up 4 to 6 weeks.  Signed, Satira Sark, MD, [facility] 12/26/2020 1:58 PM    [facility] [place], [place], [place] [address] Phone: [phone]; Fax: [phone]

## 2021-01-02 ENCOUNTER — Other Ambulatory Visit: Payer: Self-pay | Admitting: Neurology

## 2021-01-03 ENCOUNTER — Other Ambulatory Visit: Payer: Self-pay | Admitting: Neurology

## 2021-01-09 DIAGNOSIS — R2689 Other abnormalities of gait and mobility: Secondary | ICD-10-CM | POA: Diagnosis not present

## 2021-01-09 DIAGNOSIS — I509 Heart failure, unspecified: Secondary | ICD-10-CM | POA: Diagnosis not present

## 2021-01-09 DIAGNOSIS — N183 Chronic kidney disease, stage 3 unspecified: Secondary | ICD-10-CM | POA: Diagnosis not present

## 2021-01-14 ENCOUNTER — Telehealth: Payer: Self-pay | Admitting: *Deleted

## 2021-01-14 ENCOUNTER — Encounter: Payer: Self-pay | Admitting: Internal Medicine

## 2021-01-14 DIAGNOSIS — E1139 Type 2 diabetes mellitus with other diabetic ophthalmic complication: Secondary | ICD-10-CM | POA: Diagnosis not present

## 2021-01-14 DIAGNOSIS — Z794 Long term (current) use of insulin: Secondary | ICD-10-CM | POA: Diagnosis not present

## 2021-01-14 DIAGNOSIS — E1129 Type 2 diabetes mellitus with other diabetic kidney complication: Secondary | ICD-10-CM | POA: Diagnosis not present

## 2021-01-14 DIAGNOSIS — I129 Hypertensive chronic kidney disease with stage 1 through stage 4 chronic kidney disease, or unspecified chronic kidney disease: Secondary | ICD-10-CM | POA: Diagnosis not present

## 2021-01-14 DIAGNOSIS — N1831 Chronic kidney disease, stage 3a: Secondary | ICD-10-CM | POA: Diagnosis not present

## 2021-01-14 NOTE — Assessment & Plan Note (Signed)
Benefits from CPAP Plan- continue auto 7-13. Add chin strip to see if we can improve leak despite nasal pillows.

## 2021-01-14 NOTE — Progress Notes (Signed)
Kindly inform the patient that EEG study was normal

## 2021-01-14 NOTE — Telephone Encounter (Signed)
-----   Message from Garvin Fila, MD sent at 01/14/2021  5:23 PM EDT ----- Patrick Miranda inform the patient that EEG study was normal

## 2021-01-14 NOTE — Telephone Encounter (Signed)
I spoke to the patient's wife and provided her with the EEG results.

## 2021-01-17 ENCOUNTER — Other Ambulatory Visit (HOSPITAL_COMMUNITY)
Admission: RE | Admit: 2021-01-17 | Discharge: 2021-01-17 | Disposition: A | Discharge status: Home / Self Care | Payer: Medicare HMO | Source: Ambulatory Visit | Attending: Cardiology | Admitting: Cardiology

## 2021-01-17 DIAGNOSIS — I255 Ischemic cardiomyopathy: Secondary | ICD-10-CM | POA: Insufficient documentation

## 2021-01-17 DIAGNOSIS — Z79899 Other long term (current) drug therapy: Secondary | ICD-10-CM | POA: Diagnosis not present

## 2021-01-17 LAB — BASIC METABOLIC PANEL
Anion gap: 4 — ABNORMAL LOW (ref 5–15)
BUN: 19 mg/dL (ref 8–23)
CO2: 28 mmol/L (ref 22–32)
Calcium: 8.3 mg/dL — ABNORMAL LOW (ref 8.9–10.3)
Chloride: 107 mmol/L (ref 98–111)
Creatinine, Ser: 1.05 mg/dL (ref 0.61–1.24)
GFR, Estimated: >60 mL/min (ref >60)
Glucose, Bld: 164 mg/dL — ABNORMAL HIGH (ref 70–99)
Potassium: 4.1 mmol/L (ref 3.5–5.1)
Sodium: 139 mmol/L (ref 135–145)

## 2021-01-23 ENCOUNTER — Other Ambulatory Visit: Payer: Self-pay

## 2021-01-23 ENCOUNTER — Ambulatory Visit (INDEPENDENT_AMBULATORY_CARE_PROVIDER_SITE_OTHER): Payer: Medicare HMO | Admitting: Podiatry

## 2021-01-23 ENCOUNTER — Encounter: Payer: Self-pay | Admitting: Podiatry

## 2021-01-23 DIAGNOSIS — B351 Tinea unguium: Secondary | ICD-10-CM | POA: Diagnosis not present

## 2021-01-23 DIAGNOSIS — D689 Coagulation defect, unspecified: Secondary | ICD-10-CM | POA: Diagnosis not present

## 2021-01-23 DIAGNOSIS — M79676 Pain in unspecified toe(s): Secondary | ICD-10-CM | POA: Diagnosis not present

## 2021-01-23 DIAGNOSIS — E119 Type 2 diabetes mellitus without complications: Secondary | ICD-10-CM | POA: Diagnosis not present

## 2021-01-23 NOTE — Progress Notes (Signed)
This patient returns to my office for at risk foot care.  This patient requires this care by a professional since this patient will be at risk due to having diabetes and CKD.  Patient is taking plavix and eliquis causing coagulation defect. This patient is unable to cut nails himself since the patient cannot reach his nails.These nails are painful walking and wearing shoes.  This patient presents for at risk foot care today.  General Appearance  Alert, conversant and in no acute stress.  Vascular  Dorsalis pedis and posterior tibial  pulses are palpable  bilaterally.  Capillary return is within normal limits  bilaterally. Temperature is within normal limits  bilaterally.  Neurologic  Senn-Weinstein monofilament wire test within normal limits  bilaterally. Muscle power within normal limits bilaterally.  Nails Thick disfigured discolored nails with subungual debris  from hallux to fifth toes bilaterally. No evidence of bacterial infection or drainage bilaterally.  Asymptomatic left hallux nail.  Orthopedic  No limitations of motion  feet .  No crepitus or effusions noted.  HAV  B/L.  Overlapping second toe left foot.  Skin  normotropic skin with no porokeratosis noted bilaterally.  No signs of infections or ulcers noted.   Asymptomatic pinch callus.  Onychomycosis  Pain in right toes  Pain in left toes  Consent was obtained for treatment procedures.   Mechanical debridement of nails 1-5  bilaterally performed with a nail nipper.  Filed with dremel without incident.    Return office visit 4  months                  Told patient to return for periodic foot care and evaluation due to potential at risk complications.   Gardiner Barefoot DPM

## 2021-01-24 ENCOUNTER — Other Ambulatory Visit: Payer: Self-pay | Admitting: Cardiology

## 2021-01-24 ENCOUNTER — Other Ambulatory Visit: Payer: Self-pay

## 2021-01-24 NOTE — Patient Outreach (Signed)
[place] [facility]) Care Management  [place]  01/24/2021   Kersey 1946-02-09 [id number]  Subjective: Telephone call to spouse. She reports she feels patient is worse. Patient sleeping more and walks slower.  Discussed dementia and changes.  She verbalized understanding. Patient to see the neurologist soon per spouse. Discussed diabetes management.  No concerns.   Objective:   Encounter Medications:  Outpatient Encounter Medications as of 01/24/2021  Medication Sig   acetaminophen (TYLENOL) 325 MG tablet Take 2 tablets (650 mg total) by mouth every 6 (six) hours as needed for mild pain or headache (or Fever >/= 101).   apixaban (ELIQUIS) 5 MG TABS tablet Take 1 tablet (5 mg total) by mouth 2 (two) times daily.   aspirin EC 81 MG EC tablet Take 1 tablet (81 mg total) by mouth daily. Swallow whole.   BD PEN NEEDLE NANO U/F 32G X 4 MM MISC 1 each by Other route daily.    Continuous Blood Gluc Sensor (FREESTYLE LIBRE 2 SENSOR) MISC See admin instructions.   fenofibrate 160 MG tablet Take 160 mg by mouth daily.   finasteride (PROSCAR) 5 MG tablet Take 5 mg by mouth daily.   HUMALOG KWIKPEN 100 UNIT/ML KwikPen Inject 5-8 Units into the skin with breakfast, with lunch, and with evening meal.   memantine (NAMENDA TITRATION PAK) tablet pack 5 mg/day for =1 week; 5 mg twice daily for =1 week; 15 mg/day given in 5 mg and 10 mg separated doses for =1 week; then 10 mg twice daily   memantine (NAMENDA) 10 MG tablet Take 1 tablet (10 mg total) by mouth 2 (two) times daily.   metFORMIN (GLUCOPHAGE) 500 MG tablet Take 500 mg by mouth 2 (two) times daily with a meal.   metoprolol succinate (TOPROL XL) 25 MG 24 hr tablet Take 1 tablet (25 mg total) by mouth in the morning and at bedtime.   metoprolol tartrate (LOPRESSOR) 25 MG tablet    NOVOLOG FLEXPEN 100 UNIT/ML FlexPen    ONETOUCH VERIO test strip 1 each by Other route in the morning, at noon, and at bedtime.     PFIZER-BIONT COVID-19 VAC-TRIS SUSP injection    rosuvastatin (CRESTOR) 20 MG tablet Take 1 tablet (20 mg total) by mouth daily.   spironolactone (ALDACTONE) 25 MG tablet Take 0.5 tablets (12.5 mg total) by mouth daily.   tamsulosin (FLOMAX) 0.4 MG CAPS capsule Take 1 capsule (0.4 mg total) by mouth daily. (Patient taking differently: Take 0.4 mg by mouth at bedtime.)   TOUJEO SOLOSTAR 300 UNIT/ML Solostar Pen Inject 20 Units into the skin daily. (Patient taking differently: Inject 15 Units into the skin daily.)   No facility-administered encounter medications on file as of 01/24/2021.    Functional Status:  In your present state of health, do you have any difficulty performing the following activities: 11/28/2020 09/10/2020  Hearing? Y Y  Comment no hearing aids -  Vision? N N  Difficulty concentrating or making decisions? Y Y  Comment dementia -  Walking or climbing stairs? Y Y  Comment some weakness -  Dressing or bathing? Tempie Donning  Comment wife assists at times. -  Doing errands, shopping? Tempie Donning  Comment wife assists -  Conservation officer, nature and eating ? Y -  Using the Toilet? N -  In the past six months, have you accidently leaked urine? N -  Do you have problems with loss of bowel control? N -  Managing your Medications? Y -  Comment wife helps -  Managing your Finances? Y -  Comment wife helps -  Housekeeping or managing your Housekeeping? Y -  Comment wife helps -  Some recent data might be hidden    Fall/Depression Screening: Fall Risk  11/28/2020 01/19/2020 12/20/2019  Falls in the past year? 0 0 0  Number falls in past yr: - 0 0  Injury with Fall? - 0 0  Comment - N/A- no falls reported N/A- no falls reported  Risk for fall due to : - No Fall Risks Medication side effect;Other (Comment)  Risk for fall due to: Comment - - weakness, post-hospital discharge  Follow up - Falls prevention discussed Falls prevention discussed   PHQ 2/9 Scores 11/28/2020 01/19/2020 12/20/2019  PHQ - 2 Score - 0  1  Exception Documentation Other- indicate reason in comment box - -  Not completed Wife [name] does assessment - -    Assessment:   Care Plan Care Plan : Diabetes Type 2 (Adult)  Updates made by Jon Billings, RN since 01/24/2021 12:00 AM     Problem: Glycemic Management (Diabetes, Type 2)      Long-Range Goal: Glycemic Management Optimized as evidenced by A1c less than 7.   Start Date: 11/28/2020  Expected End Date: 07/20/2021  This Visit's Progress: On track  Priority: High  Note:   Evidence-based guidance:  Anticipate A1C testing (point-of-care) every 3 to 6 months based on goal attainment.  Provide medical nutrition therapy and development of individualized eating.  Compare self-reported symptoms of hypo or hyperglycemia to blood glucose levels, diet and fluid intake, current medications, psychosocial and physiologic stressors, change in activity and barriers to care adherence.  Promote self-monitoring of blood glucose levels.   Notes:     Task: Alleviate Barriers to Glycemic Management   Due Date: 07/20/2021  Priority: Routine  Responsible User: Jon Billings, RN  Note:   Care Management Activities:    - blood glucose monitoring encouraged - self-awareness of signs/symptoms of hypo or hyperglycemia encouraged    Notes: 11/28/20 Patient A1c 5.9.   01/24/21 Patient blood sugars up and down per spouse due to patient eating habits.  Advised on concern if next A1c is too elevated.    Diabetes Management Discussed: Medication adherence Reviewed importance of limiting carbs such as rice, potatoes, breads, and pastas. Also discussed limiting sweets and sugary drinks.  Discussed importance of portion control.  Also discussed importance of annual exams, foot exams, and eye exams.         Goals Addressed             This Visit's Progress    COMPLETED: Make and Keep All Appointments   On track    Barriers: Knowledge Timeframe:  Long-Range Goal Priority:  High Start  Date:     11/28/20                        Expected End Date:   12/32/22                    Follow Up Date 02/17/21   - arrange a ride through an agency 1 week before appointment    Why is this important?   Part of staying healthy is seeing the doctor for follow-up care.  If you forget your appointments, there are some things you can do to stay on track.    Notes: 11/28/20 no problems with transportation.  Monitor and Manage My Blood Sugar-Diabetes Type 2   On track    Barriers: Knowledge Timeframe:  Long-Range Goal Priority:  High Start Date:   11/28/20                          Expected End Date: 07/20/21                     Follow Up Date 04/19/21  - check blood sugar at prescribed times - take the blood sugar log to all doctor visits    Why is this important?   Checking your blood sugar at home helps to keep it from getting very high or very low.  Writing the results in a diary or log helps the doctor know how to care for you.  Your blood sugar log should have the time, date and the results.  Also, write down the amount of insulin or other medicine that you take.  Other information, like what you ate, exercise done and how you were feeling, will also be helpful.     Notes: 11/28/20 Continue to monitor blood sugars.  A1c 5.9  01/24/21 Patient blood sugars up and down per spouse due to patient eating habits.  Advised on concern if next A1c is too elevated.    Diabetes Management Discussed: Medication adherence Reviewed importance of limiting carbs such as rice, potatoes, breads, and pastas. Also discussed limiting sweets and sugary drinks.  Discussed importance of portion control.  Also discussed importance of annual exams, foot exams, and eye exams.            Plan:  Follow-up: Patient agrees to Care Plan and Follow-up. Follow-up in 2 month(s)  Jone Baseman, RN, MSN [place] Management Care Management Coordinator Direct Line [phone] Cell [phone] Toll  Free: [phone]  Fax: [phone]

## 2021-01-24 NOTE — Telephone Encounter (Signed)
Prescription refill request for Eliquis received. Indication: PAF Last office visit: 12/26/20  Domenic Polite MD Scr: 1.05 on 01/17/21 Age: 75 Weight: 73.7kg  Based on above findings Eliquis 5mg  twice daily is the appropriate dose.  Refill approved.

## 2021-01-24 NOTE — Patient Instructions (Signed)
Goals Addressed             This Visit's Progress    COMPLETED: Make and Keep All Appointments   On track    Barriers: Knowledge Timeframe:  Long-Range Goal Priority:  High Start Date:     11/28/20                        Expected End Date:   12/32/22                    Follow Up Date 02/17/21   - arrange a ride through an agency 1 week before appointment    Why is this important?   Part of staying healthy is seeing the doctor for follow-up care.  If you forget your appointments, there are some things you can do to stay on track.    Notes: 11/28/20 no problems with transportation.        Monitor and Manage My Blood Sugar-Diabetes Type 2   On track    Barriers: Knowledge Timeframe:  Long-Range Goal Priority:  High Start Date:   11/28/20                          Expected End Date: 07/20/21                     Follow Up Date 04/19/21  - check blood sugar at prescribed times - take the blood sugar log to all doctor visits    Why is this important?   Checking your blood sugar at home helps to keep it from getting very high or very low.  Writing the results in a diary or log helps the doctor know how to care for you.  Your blood sugar log should have the time, date and the results.  Also, write down the amount of insulin or other medicine that you take.  Other information, like what you ate, exercise done and how you were feeling, will also be helpful.     Notes: 11/28/20 Continue to monitor blood sugars.  A1c 5.9  01/24/21 Patient blood sugars up and down per spouse due to patient eating habits.  Advised on concern if next A1c is too elevated.    Diabetes Management Discussed: Medication adherence Reviewed importance of limiting carbs such as rice, potatoes, breads, and pastas. Also discussed limiting sweets and sugary drinks.  Discussed importance of portion control.  Also discussed importance of annual exams, foot exams, and eye exams.

## 2021-01-29 ENCOUNTER — Other Ambulatory Visit: Payer: Self-pay | Admitting: Cardiology

## 2021-01-30 ENCOUNTER — Ambulatory Visit: Payer: Self-pay

## 2021-01-30 DIAGNOSIS — N1831 Chronic kidney disease, stage 3a: Secondary | ICD-10-CM | POA: Diagnosis not present

## 2021-01-30 DIAGNOSIS — Z794 Long term (current) use of insulin: Secondary | ICD-10-CM | POA: Diagnosis not present

## 2021-01-30 DIAGNOSIS — I129 Hypertensive chronic kidney disease with stage 1 through stage 4 chronic kidney disease, or unspecified chronic kidney disease: Secondary | ICD-10-CM | POA: Diagnosis not present

## 2021-01-30 DIAGNOSIS — I2510 Atherosclerotic heart disease of native coronary artery without angina pectoris: Secondary | ICD-10-CM | POA: Diagnosis not present

## 2021-01-30 DIAGNOSIS — E1129 Type 2 diabetes mellitus with other diabetic kidney complication: Secondary | ICD-10-CM | POA: Diagnosis not present

## 2021-02-04 ENCOUNTER — Telehealth: Payer: Self-pay | Admitting: Cardiology

## 2021-02-04 NOTE — Telephone Encounter (Signed)
Pt's wife wanted her to let our office know that pt's bp was a little low. Pt's wife informed to keep a check on bp's and report to [place] in a couple of days.

## 2021-02-04 NOTE — Telephone Encounter (Signed)
New message    Patient wife called - the PCP for this patient wanted them to follow up with Dr Domenic Polite his blood pressure when in office last week was 92/56 and his O2 level is staying around 92 , they thought this was a little low .

## 2021-02-06 ENCOUNTER — Ambulatory Visit: Payer: Medicare HMO | Admitting: Cardiology

## 2021-02-07 ENCOUNTER — Ambulatory Visit: Payer: Medicare HMO | Admitting: Student

## 2021-02-07 DIAGNOSIS — R310 Gross hematuria: Secondary | ICD-10-CM | POA: Diagnosis not present

## 2021-02-07 DIAGNOSIS — N401 Enlarged prostate with lower urinary tract symptoms: Secondary | ICD-10-CM | POA: Diagnosis not present

## 2021-02-07 DIAGNOSIS — R3914 Feeling of incomplete bladder emptying: Secondary | ICD-10-CM | POA: Diagnosis not present

## 2021-02-07 DIAGNOSIS — R3915 Urgency of urination: Secondary | ICD-10-CM | POA: Diagnosis not present

## 2021-02-07 DIAGNOSIS — R350 Frequency of micturition: Secondary | ICD-10-CM | POA: Diagnosis not present

## 2021-02-08 DIAGNOSIS — R2689 Other abnormalities of gait and mobility: Secondary | ICD-10-CM | POA: Diagnosis not present

## 2021-02-08 DIAGNOSIS — I509 Heart failure, unspecified: Secondary | ICD-10-CM | POA: Diagnosis not present

## 2021-02-08 DIAGNOSIS — N183 Chronic kidney disease, stage 3 unspecified: Secondary | ICD-10-CM | POA: Diagnosis not present

## 2021-02-13 DIAGNOSIS — K8020 Calculus of gallbladder without cholecystitis without obstruction: Secondary | ICD-10-CM | POA: Diagnosis not present

## 2021-02-13 DIAGNOSIS — R590 Localized enlarged lymph nodes: Secondary | ICD-10-CM | POA: Diagnosis not present

## 2021-02-13 DIAGNOSIS — N400 Enlarged prostate without lower urinary tract symptoms: Secondary | ICD-10-CM | POA: Diagnosis not present

## 2021-02-13 DIAGNOSIS — N200 Calculus of kidney: Secondary | ICD-10-CM | POA: Diagnosis not present

## 2021-02-13 DIAGNOSIS — R310 Gross hematuria: Secondary | ICD-10-CM | POA: Diagnosis not present

## 2021-02-17 ENCOUNTER — Encounter (HOSPITAL_COMMUNITY): Payer: Self-pay

## 2021-02-17 ENCOUNTER — Emergency Department (HOSPITAL_COMMUNITY): Payer: Medicare HMO

## 2021-02-17 ENCOUNTER — Other Ambulatory Visit: Payer: Self-pay

## 2021-02-17 ENCOUNTER — Emergency Department (HOSPITAL_COMMUNITY)
Admission: EM | Admit: 2021-02-17 | Discharge: 2021-02-17 | Disposition: A | Discharge status: Home / Self Care | Payer: Medicare HMO | Attending: Emergency Medicine | Admitting: Emergency Medicine

## 2021-02-17 DIAGNOSIS — F0390 Unspecified dementia without behavioral disturbance: Secondary | ICD-10-CM | POA: Diagnosis not present

## 2021-02-17 DIAGNOSIS — Z96653 Presence of artificial knee joint, bilateral: Secondary | ICD-10-CM | POA: Diagnosis not present

## 2021-02-17 DIAGNOSIS — G319 Degenerative disease of nervous system, unspecified: Secondary | ICD-10-CM | POA: Diagnosis not present

## 2021-02-17 DIAGNOSIS — Z7901 Long term (current) use of anticoagulants: Secondary | ICD-10-CM | POA: Insufficient documentation

## 2021-02-17 DIAGNOSIS — Z8616 Personal history of COVID-19: Secondary | ICD-10-CM | POA: Diagnosis not present

## 2021-02-17 DIAGNOSIS — Z79899 Other long term (current) drug therapy: Secondary | ICD-10-CM | POA: Insufficient documentation

## 2021-02-17 DIAGNOSIS — Z7984 Long term (current) use of oral hypoglycemic drugs: Secondary | ICD-10-CM | POA: Diagnosis not present

## 2021-02-17 DIAGNOSIS — M5030 Other cervical disc degeneration, unspecified cervical region: Secondary | ICD-10-CM | POA: Diagnosis not present

## 2021-02-17 DIAGNOSIS — Z951 Presence of aortocoronary bypass graft: Secondary | ICD-10-CM | POA: Insufficient documentation

## 2021-02-17 DIAGNOSIS — N1831 Chronic kidney disease, stage 3a: Secondary | ICD-10-CM | POA: Insufficient documentation

## 2021-02-17 DIAGNOSIS — W01198A Fall on same level from slipping, tripping and stumbling with subsequent striking against other object, initial encounter: Secondary | ICD-10-CM | POA: Diagnosis not present

## 2021-02-17 DIAGNOSIS — M4802 Spinal stenosis, cervical region: Secondary | ICD-10-CM | POA: Diagnosis not present

## 2021-02-17 DIAGNOSIS — I129 Hypertensive chronic kidney disease with stage 1 through stage 4 chronic kidney disease, or unspecified chronic kidney disease: Secondary | ICD-10-CM | POA: Insufficient documentation

## 2021-02-17 DIAGNOSIS — I2510 Atherosclerotic heart disease of native coronary artery without angina pectoris: Secondary | ICD-10-CM | POA: Insufficient documentation

## 2021-02-17 DIAGNOSIS — Y92002 Bathroom of unspecified non-institutional (private) residence single-family (private) house as the place of occurrence of the external cause: Secondary | ICD-10-CM | POA: Diagnosis not present

## 2021-02-17 DIAGNOSIS — E1122 Type 2 diabetes mellitus with diabetic chronic kidney disease: Secondary | ICD-10-CM | POA: Diagnosis not present

## 2021-02-17 DIAGNOSIS — Y9301 Activity, walking, marching and hiking: Secondary | ICD-10-CM | POA: Diagnosis not present

## 2021-02-17 DIAGNOSIS — S0990XA Unspecified injury of head, initial encounter: Secondary | ICD-10-CM | POA: Diagnosis not present

## 2021-02-17 DIAGNOSIS — Z7982 Long term (current) use of aspirin: Secondary | ICD-10-CM | POA: Diagnosis not present

## 2021-02-17 DIAGNOSIS — Z043 Encounter for examination and observation following other accident: Secondary | ICD-10-CM | POA: Diagnosis not present

## 2021-02-17 DIAGNOSIS — W19XXXA Unspecified fall, initial encounter: Secondary | ICD-10-CM

## 2021-02-17 DIAGNOSIS — Z85828 Personal history of other malignant neoplasm of skin: Secondary | ICD-10-CM | POA: Diagnosis not present

## 2021-02-17 NOTE — Discharge Instructions (Addendum)
You were seen in the emergency department after a fall.  Your head CT and neck CT did not show any brain bleeds or fractures.  Your CT scans did show some findings of prior stroke and some degenerative changes in your neck-please discuss with your primary care provider.  Please call your primary care office to schedule follow-up for soon as possible.  Return to the emergency department for new or worsening symptoms including but not limited to new or worsening pain, sudden onset headache, change in your vision, numbness, weakness, chest pain, abdominal pain, blood in your urine or stool, trouble breathing, confusion, or any other concerns.

## 2021-02-17 NOTE — ED Notes (Signed)
Patient transported to CT by [facility]

## 2021-02-17 NOTE — ED Triage Notes (Signed)
Pt presents to ED via POV for a fall this am. Pt takes a blood thinner, unsure of what kind. Pt reports he trip and fell while walking to the bathroom. Pt reports he did hit his head on the floor. Denies LOC or N/V. A/O x4, no distress noted

## 2021-02-17 NOTE — ED Notes (Signed)
Trauma Response Nurse Note-  Reason for Call / Reason for Trauma activation:   - L2 fall on thinners  Initial Focused Assessment (If applicable, or please see trauma documentation):  - Pt A&Ox4 - Knot to back of head - C/O no pain - No distress/ lungs clear/ PERRL - VSS  Interventions:  - VS - CT of head and neck - Placed c-collar  Plan of Care as of this note:  - Awaiting CT results - Will hold off and IV and labs at this time per PA.  Event Summary:   - Pt arrived by private vehicle driven by his wife.  Pt suffered from a fall while walking to the BR.  Per pt's wife, he fell into the bathtub hitting the back of his head.  No LOC noted.  Pt seemingly at baseline and has some baseline dementia.  Pt does have a history of multiple bypass surgeries and A-fib.  He is on Eliquis.    [place], [place]

## 2021-02-17 NOTE — Progress Notes (Signed)
Orthopedic Tech Progress Note Patient Details:  [place] [birth year] [id number]  Level 2 Trauma  Patient ID: [place], male   DOB: Aug 07, 1945, 75 y.o.   MRN: [id number]  Carin Primrose 02/17/2021, 10:33 AM

## 2021-02-17 NOTE — ED Notes (Signed)
Ambulated pt to restroom. Pt denies dizziness or other issues. Pt has slow even steady gait.

## 2021-02-17 NOTE — ED Provider Notes (Signed)
[facility] EMERGENCY DEPARTMENT Provider Note   CSN: [id number] Arrival date & time: 02/17/21  0944     History Chief Complaint  Patient presents with   Level 2/ fall    Patrick Miranda is a 75 y.o. male with a hx of CAD, prior VTE, chronic anticoagulation on eliquis, prior stroke, hypertension, and T2DM who presents to the emergency department via EMS as a level 2 trauma due to mechanical fall on blood thinners.  Patient states that he was walking to the bathroom when he thinks that he tripped/misstepped and fell backwards into the bathtub.  He struck his head but did not lose consciousness.  He was able to get up and ambulate with the assistance of his wife.  He states he overall feels okay right now without significant pain.  No significant alleviating or aggravating factors.  He denies any prodromal lightheadedness, dizziness, chest pain, or dyspnea.  He has been acting at his mental status baseline per his wife at bedside.  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[place])    Left ventricular mural thrombus    NSTEMI (non-ST elevated myocardial infarction) ([place])    OSA on CPAP    Pulmonary emboli [facility])    May 2021   Skin cancer    Type 2 diabetes mellitus ([place])     Patient Active Problem List   Diagnosis Date Noted   Dementia ([place]) 123XX123   Umbilical hernia, incarcerated [id number]   Metabolic encephalopathy [id number]   Abdominal pain 08/03/2020   Chronic kidney disease, stage 3a ([place]) 08/03/2020   Mixed diabetic hyperlipidemia associated with type 2 diabetes mellitus ([place]) 08/03/2020    Uncontrolled type 2 diabetes mellitus with hyperglycemia, with long-term current use of insulin ([place]) 08/03/2020   Foodborne gastroenteritis 08/03/2020   SIRS (systemic inflammatory response syndrome) ([place]) 08/03/2020   Postop check 07/25/2020   Hx of CABG 07/09/2020   Coronary artery disease involving native heart 07/04/2020   Abnormal nuclear stress test    Hypersomnia 08/30/2019   History of adenomatous polyp of colon 05/06/2018   Vasomotor rhinitis 07/18/2017   Arthritis of knee, right 01/06/2014   Dysarthria 09/05/2011    Class: Acute   Essential hypertension 09/05/2011    Class: Chronic   OSA on CPAP 09/05/2011    Class: Chronic   Anemia 09/05/2011    Class: Chronic    Past Surgical History:  Procedure Laterality Date   COLONOSCOPY W/ POLYPECTOMY     CORONARY ARTERY BYPASS GRAFT N/A 07/09/2020   Procedure: CORONARY ARTERY BYPASS GRAFTING (CABG) TIMES THREE , USING LEFT INTERNAL MAMMARY ARTERY AND RIGHT LEG GREATER SAPHENOUS VEIN HARVESTED ENDOSCOPICALLY;  Surgeon: Ivin Poot, MD;  Location: [facility];  Service: Open Heart Surgery;  Laterality: N/A;   LEFT HEART CATH AND CORONARY ANGIOGRAPHY N/A 07/04/2020   Procedure: LEFT HEART CATH AND CORONARY ANGIOGRAPHY;  Surgeon: Troy Sine, MD;  Location: [facility] CV LAB;  Service: Cardiovascular;  Laterality: N/A;   TEE WITHOUT CARDIOVERSION  N/A 07/09/2020   Procedure: TRANSESOPHAGEAL ECHOCARDIOGRAM (TEE);  Surgeon: Prescott Gum, Collier Salina, MD;  Location: [facility];  Service: Open Heart Surgery;  Laterality: N/A;   TOTAL KNEE ARTHROPLASTY Left 04/22/2013   Procedure: TOTAL KNEE ARTHROPLASTY- left;  Surgeon: Augustin Schooling, MD;  Location: [facility];  Service: Orthopedics;  Laterality: Left;  with femoral block   TOTAL KNEE ARTHROPLASTY Right 01/06/2014   Procedure: RIGHT TOTAL KNEE ARTHROPLASTY;  Surgeon: Augustin Schooling, MD;  Location: [facility];  Service: Orthopedics;  Laterality: Right;       Family History  Problem Relation Age of  Onset   CAD Brother        Premature   CAD Sister     Social History   Tobacco Use   Smoking status: Never   Smokeless tobacco: Never  Vaping Use   Vaping Use: Never used  Substance Use Topics   Alcohol use: No   Drug use: No    Home Medications Prior to Admission medications   Medication Sig Start Date End Date Taking? Authorizing Provider  acetaminophen (TYLENOL) 325 MG tablet Take 2 tablets (650 mg total) by mouth every 6 (six) hours as needed for mild pain or headache (or Fever >/= 101). 08/10/20   Domenic Polite, MD  apixaban (ELIQUIS) 5 MG TABS tablet TAKE 1 TABLET(5 MG) BY MOUTH TWICE DAILY 01/24/21   Satira Sark, MD  aspirin EC 81 MG EC tablet Take 1 tablet (81 mg total) by mouth daily. Swallow whole. 09/12/20   Bonnielee Haff, MD  BD PEN NEEDLE NANO U/F 32G X 4 MM MISC 1 each by Other route daily.  03/19/18   [provider]  Continuous Blood Gluc Sensor (FREESTYLE LIBRE 2 SENSOR) MISC See admin instructions. 10/31/20   [provider]  fenofibrate 160 MG tablet Take 160 mg by mouth daily.    [provider]  finasteride (PROSCAR) 5 MG tablet Take 5 mg by mouth daily. 08/17/20   [provider]  HUMALOG KWIKPEN 100 UNIT/ML KwikPen Inject 5-8 Units into the skin with breakfast, with lunch, and with evening meal. 04/27/18   [provider]  memantine (NAMENDA TITRATION PAK) tablet pack 5 mg/day for =1 week; 5 mg twice daily for =1 week; 15 mg/day given in 5 mg and 10 mg separated doses for =1 week; then 10 mg twice daily 12/06/20   Garvin Fila, MD  memantine (NAMENDA) 10 MG tablet Take 1 tablet (10 mg total) by mouth 2 (two) times daily. 01/02/21   Garvin Fila, MD  metFORMIN (GLUCOPHAGE) 500 MG tablet Take 500 mg by mouth 2 (two) times daily with a meal. 10/10/20   [provider]  metoprolol succinate (TOPROL-XL) 25 MG 24 hr tablet TAKE 1 TABLET(25 MG) BY MOUTH DAILY AFTER AND SUPPER 01/29/21   Ahmed Prima, Fransisco Hertz,  PA-C  metoprolol tartrate (LOPRESSOR) 25 MG tablet  11/26/20   [provider]  NOVOLOG FLEXPEN 100 UNIT/ML FlexPen  12/04/20   [provider]  [facility] VERIO test strip 1 each by Other route in the morning, at noon, and at bedtime.  03/20/18   [provider]  PFIZER-BIONT COVID-19 VAC-TRIS SUSP injection  10/03/20   [provider]  rosuvastatin (CRESTOR) 20 MG tablet Take 1 tablet (20 mg total) by mouth daily. 11/27/20   Satira Sark, MD  spironolactone (ALDACTONE) 25 MG tablet Take 0.5 tablets (12.5 mg total) by mouth daily. 12/26/20   Satira Sark, MD  tamsulosin (FLOMAX) 0.4 MG CAPS capsule Take 1 capsule (0.4 mg total) by mouth daily. Patient taking differently: Take 0.4 mg by mouth at bedtime. 08/11/20   Domenic Polite, MD  TOUJEO SOLOSTAR 300 UNIT/ML Solostar Pen Inject 20 Units into the skin daily. Patient taking differently: Inject 15 Units into the skin daily. 08/10/20   Domenic Polite, MD    Allergies    Lipitor [atorvastatin] and Sitagliptin  Review of Systems   Review of Systems  Constitutional:  Negative for chills and fever.  Eyes:  Negative for visual disturbance.  Respiratory:  Negative for shortness of breath.   Cardiovascular:  Negative for chest pain.  Gastrointestinal:  Negative for abdominal pain and vomiting.  Musculoskeletal:  Negative for arthralgias, back pain and neck pain.  Neurological:  Negative for syncope, weakness and numbness.  All other systems reviewed and are negative.  Physical Exam Updated Vital Signs BP 115/60 (BP Location: Left Arm)   Pulse 88   Temp 98.2 F (36.8 C) (Oral)   Resp 18   Ht 5' 10.5" (1.791 m)   Wt 77.1 kg   SpO2 98%   BMI 24.05 kg/m   Physical Exam Vitals and nursing note reviewed.  Constitutional:      General: He is not in acute distress.    Appearance: He is well-developed. He is not toxic-appearing.  HENT:     Head: Normocephalic and atraumatic.     Comments: No  raccoon eyes or battle sign.  No scalp hematoma or lacerations. Eyes:     General:        Right eye: No discharge.        Left eye: No discharge.     Extraocular Movements: Extraocular movements intact.     Conjunctiva/sclera: Conjunctivae normal.     Pupils: Pupils are equal, round, and reactive to light.  Cardiovascular:     Rate and Rhythm: Normal rate and regular rhythm.  Pulmonary:     Effort: Pulmonary effort is normal. No respiratory distress.     Breath sounds: Normal breath sounds. No wheezing, rhonchi or rales.  Chest:     Chest wall: No tenderness.  Abdominal:     General: There is no distension.     Palpations: Abdomen is soft.     Tenderness: There is no abdominal tenderness. There is no guarding or rebound.  Musculoskeletal:     Cervical back: Normal range of motion and neck supple. No tenderness.     Comments: Upper extremities: Patient able to actively range all major joints.  There is no focal bony tenderness to palpation Back: No midline tenderness Lower extremities: Patient able to lift bilateral lower extremities off of the stretcher and fully flex/extend the knees without difficulty.  No focal bony tenderness to palpation.  Skin:    General: Skin is warm and dry.     Findings: No rash.  Neurological:     Mental Status: He is alert.     Comments: Clear speech.  CN III through XII grossly intact.  Sensation grossly intact bilateral upper and lower extremities.  5 out of 5 symmetric grip strength.  5 out of 5 strength with plantar dorsiflexion bilaterally.  Psychiatric:        Behavior: Behavior normal.    ED Results / Procedures / Treatments   Labs (all labs ordered are listed, but only abnormal results are displayed) Labs Reviewed - No data to display  EKG None  Radiology CT Head Wo Contrast  Result Date:  02/17/2021 CLINICAL DATA:  Status post fall. EXAM: CT HEAD WITHOUT CONTRAST TECHNIQUE: Contiguous axial images were obtained from the base of the  skull through the vertex without intravenous contrast. COMPARISON:  MRI brain 09/09/2020 FINDINGS: Brain: No evidence of acute infarction, hemorrhage, hydrocephalus, extra-axial collection or mass lesion/mass effect. Remote left frontal lobe, right occipital lobe and right parietal lobe cortical infarcts. There is mild diffuse low-attenuation within the subcortical and periventricular white matter compatible with chronic microvascular disease. Prominence of the sulci and ventricles compatible with brain atrophy. Vascular: No hyperdense vessel or unexpected calcification. Skull: Normal. Negative for fracture or focal lesion. Sinuses/Orbits: No acute finding. Other: None IMPRESSION: 1. No acute intracranial abnormalities. 2. Chronic small vessel ischemic disease and brain atrophy. 3. Remote bilateral cortical infarcts. Electronically Signed   By: Kerby Moors M.D.   On: 02/17/2021 10:37   CT Cervical Spine Wo Contrast  Result Date: 02/17/2021 CLINICAL DATA:  Fall. EXAM: CT CERVICAL SPINE WITHOUT CONTRAST TECHNIQUE: Multidetector CT imaging of the cervical spine was performed without intravenous contrast. Multiplanar CT image reconstructions were also generated. COMPARISON:  None. FINDINGS: Alignment: Normal. Skull base and vertebrae: The vertebral body heights are well maintained. The facet joints appear well aligned. No acute fracture or dislocation identified. Soft tissues and spinal canal: No prevertebral fluid or swelling. No visible canal hematoma. Disc levels: Disc space narrowing and endplate spurring is noted at C5-6 and C6-7. Upper chest: Negative. Other: None IMPRESSION: 1. No evidence for cervical spine fracture. 2. Cervical degenerative disc disease. Electronically Signed   By: Kerby Moors M.D.   On: 02/17/2021 10:43    Procedures Procedures   Medications Ordered in ED Medications - No data to display  ED Course  I have reviewed the triage vital signs and the nursing notes.  Pertinent  labs & imaging results that were available during my care of the patient were reviewed by me and considered in my medical decision making (see chart for details).    MDM Rules/Calculators/A&P                           Patient presents to the ED with complaints of mechanicall fall with head injury.  Nontoxic, resting comfortably, vitals without significant abnormality.  No focal neurologic deficits.  Will obtain CT head and C-spine.  Additional history obtained:  Additional history obtained from chart review & nursing note review.   Imaging Studies ordered:  I ordered imaging studies which included CT head/cspine, I independently reviewed, formal radiology impression shows:  CT head: 1. No acute intracranial abnormalities. 2. Chronic small vessel ischemic disease and brain atrophy. 3. Remote bilateral cortical infarcts.  CT C spine: 1. No evidence for cervical spine fracture. 2. Cervical degenerative disc disease.  ED Course:  No head bleed or spinal fracture noted.  Patient without focal neurologic deficits or point/focal vertebral tenderness.  There is no chest or abdominal tenderness patient is moving all extremities without difficulty.  He is ambulatory in the emergency department.  Overall appears appropriate for discharge home.  I discussed results, treatment plan, need for follow-up, and return precautions with the patient & his wife. Provided opportunity for questions, patient & his wife confirmed understanding and are in agreement with plan.   Findings and plan of care discussed with supervising physician Dr. Billy Fischer who has evaluated the patient & is in agreement.   Portions of this note were generated with [employer]. Dictation errors may occur  despite best attempts at proofreading.  Final Clinical Impression(s) / ED Diagnoses Final diagnoses:  Fall, initial encounter  Injury of head, initial encounter    Rx / DC Orders ED Discharge Orders     None         Amaryllis Dyke, PA-C 02/17/21 1216    Gareth Morgan, MD 02/17/21 2355

## 2021-02-21 ENCOUNTER — Ambulatory Visit: Payer: Medicare HMO | Admitting: Cardiology

## 2021-02-21 DIAGNOSIS — G4733 Obstructive sleep apnea (adult) (pediatric): Secondary | ICD-10-CM | POA: Diagnosis not present

## 2021-02-23 ENCOUNTER — Other Ambulatory Visit: Payer: Self-pay | Admitting: Cardiology

## 2021-03-01 DIAGNOSIS — E1129 Type 2 diabetes mellitus with other diabetic kidney complication: Secondary | ICD-10-CM | POA: Diagnosis not present

## 2021-03-03 NOTE — Progress Notes (Signed)
Cardiology Office Note  Date: 03/04/2021   ID: Patrick Miranda, DOB 09-09-1945, MRN [id number]  PCP:  Burnard Bunting, MD  Cardiologist:  Rozann Lesches, MD Electrophysiologist:  None   Chief Complaint  Patient presents with   Cardiac follow-up    History of Present Illness: Patrick Miranda is a 75 y.o. male last seen in June.  He is here today for a follow-up visit.  I reviewed the interval chart.  He continues to be limited in terms of functional capacity.  No recent falls.  His weight is down 6 pounds compared to the last visit.  I reviewed his medications, cardiac regimen includes Toprol-XL, Eliquis, low-dose aspirin, Crestor, and Aldactone which he has tolerated.  Last LVEF 45 to 50% by echocardiogram done in February at [facility].  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[place])    Left ventricular mural thrombus    NSTEMI (non-ST elevated myocardial infarction) ([place])    OSA on CPAP    Pulmonary emboli [facility])    May 2021   Skin cancer    Type 2 diabetes mellitus ([place])     Past Surgical History:  Procedure Laterality Date   COLONOSCOPY W/ POLYPECTOMY     CORONARY ARTERY BYPASS GRAFT N/A 07/09/2020   Procedure: CORONARY ARTERY BYPASS GRAFTING (CABG) TIMES THREE , USING LEFT INTERNAL MAMMARY ARTERY AND RIGHT LEG GREATER SAPHENOUS VEIN HARVESTED ENDOSCOPICALLY;  Surgeon: Ivin Poot, MD;  Location: [facility];  Service: Open Heart Surgery;  Laterality: N/A;   LEFT HEART CATH AND CORONARY ANGIOGRAPHY N/A 07/04/2020   Procedure: LEFT HEART CATH AND CORONARY ANGIOGRAPHY;  Surgeon: Troy Sine, MD;   Location: [place] CV LAB;  Service: Cardiovascular;  Laterality: N/A;   TEE WITHOUT CARDIOVERSION N/A 07/09/2020   Procedure: TRANSESOPHAGEAL ECHOCARDIOGRAM (TEE);  Surgeon: Prescott Gum, Collier Salina, MD;  Location: [facility];  Service: Open Heart Surgery;  Laterality: N/A;   TOTAL KNEE ARTHROPLASTY Left 04/22/2013   Procedure: TOTAL KNEE ARTHROPLASTY- left;  Surgeon: Augustin Schooling, MD;  Location: [facility];  Service: Orthopedics;  Laterality: Left;  with femoral block   TOTAL KNEE ARTHROPLASTY Right 01/06/2014   Procedure: RIGHT TOTAL KNEE ARTHROPLASTY;  Surgeon: Augustin Schooling, MD;  Location: [place];  Service: Orthopedics;  Laterality: Right;    Current Outpatient Medications  Medication Sig Dispense Refill   acetaminophen (TYLENOL) 325 MG tablet Take 2 tablets (650 mg total) by mouth every 6 (six) hours as needed for mild pain or headache (or Fever >/= 101).     apixaban (ELIQUIS) 5 MG TABS tablet TAKE 1 TABLET(5 MG) BY MOUTH TWICE DAILY 60 tablet 5   aspirin EC 81 MG EC tablet Take 1 tablet (81 mg total) by mouth daily. Swallow whole. 30 tablet 11   BD PEN NEEDLE NANO U/F 32G X 4 MM MISC 1 each by Other route daily.      Continuous Blood Gluc Sensor (FREESTYLE LIBRE 2 SENSOR) MISC See admin instructions.     fenofibrate 160 MG tablet  Take 160 mg by mouth daily.     finasteride (PROSCAR) 5 MG tablet Take 5 mg by mouth daily.     memantine (NAMENDA TITRATION PAK) tablet pack 5 mg/day for =1 week; 5 mg twice daily for =1 week; 15 mg/day given in 5 mg and 10 mg separated doses for =1 week; then 10 mg twice daily 49 tablet 12   memantine (NAMENDA) 10 MG tablet Take 1 tablet (10 mg total) by mouth 2 (two) times daily. 180 tablet 1   metFORMIN (GLUCOPHAGE) 500 MG tablet Take 500 mg by mouth 2 (two) times daily with a meal.     metoprolol succinate (TOPROL-XL) 25 MG 24 hr tablet TAKE 1 TABLET(25 MG) BY MOUTH DAILY AFTER AND SUPPER 90 tablet 1   NOVOLOG FLEXPEN 100 UNIT/ML FlexPen      ONETOUCH VERIO test strip  1 each by Other route in the morning, at noon, and at bedtime.   3   rosuvastatin (CRESTOR) 20 MG tablet TAKE 1 TABLET(20 MG) BY MOUTH DAILY 90 tablet 3   spironolactone (ALDACTONE) 25 MG tablet Take 0.5 tablets (12.5 mg total) by mouth daily. 45 tablet 1   tamsulosin (FLOMAX) 0.4 MG CAPS capsule Take 1 capsule (0.4 mg total) by mouth daily. (Patient taking differently: Take 0.4 mg by mouth at bedtime.) 30 capsule 0   TOUJEO SOLOSTAR 300 UNIT/ML Solostar Pen Inject 20 Units into the skin daily. (Patient taking differently: Inject 15 Units into the skin daily.)     HUMALOG KWIKPEN 100 UNIT/ML KwikPen Inject 5-8 Units into the skin with breakfast, with lunch, and with evening meal. (Patient not taking: Reported on 03/04/2021)     metoprolol tartrate (LOPRESSOR) 25 MG tablet  (Patient not taking: Reported on 03/04/2021)     PFIZER-BIONT COVID-19 VAC-TRIS SUSP injection  (Patient not taking: Reported on 03/04/2021)     No current facility-administered medications for this visit.   Allergies:  Lipitor [atorvastatin] and Sitagliptin   ROS: No orthopnea or PND.  Physical Exam: VS:  BP 138/66   Pulse 76   Ht 5' 10.5" (1.791 m)   Wt 164 lb (74.4 kg)   SpO2 99%   BMI 23.20 kg/m , BMI Body mass index is 23.2 kg/m.  Wt Readings from Last 3 Encounters:  03/04/21 164 lb (74.4 kg)  02/17/21 170 lb (77.1 kg)  12/26/20 162 lb 6.4 oz (73.7 kg)    General: Elderly male, appears comfortable at rest. HEENT: Conjunctiva and lids normal, wearing a mask. Neck: Supple, no elevated JVP or carotid bruits, no thyromegaly. Lungs: Clear to auscultation, nonlabored breathing at rest. Cardiac: Regular rate and rhythm, no S3 or significant systolic murmur, no pericardial rub. Extremities: No pitting edema.  ECG:  An ECG dated 10/17/2020 was personally reviewed today and demonstrated:  Sinus tachycardia with IVCD and fusion beats.  Recent Labwork: 08/04/2020: B Natriuretic Peptide 656.9 08/10/2020: Magnesium  1.9 09/11/2020: ALT 19; AST 34 10/17/2020: Hemoglobin 11.2; Platelets 481 12/06/2020: TSH 3.430 01/17/2021: BUN 19; Creatinine, Ser 1.05; Potassium 4.1; Sodium 139     Component Value Date/Time   CHOL 65 09/09/2020 0402   TRIG 94 09/09/2020 0402   HDL 21 (L) 09/09/2020 0402   CHOLHDL 3.1 09/09/2020 0402   VLDL 19 09/09/2020 0402   LDLCALC 25 09/09/2020 0402    Other Studies Reviewed Today:  Echocardiogram 09/09/2020:  1. Left ventricular ejection fraction, by estimation, is 40%. LVEF is  depressed with akinesis of the inferior/inferoseptal base with aneurysmal  dilitation.  There is also akinesis of the apex. Compared to echo from  January 2022, the apical thrombus is no   longer present. The diastolic parameters are consistent with Grade I  diastolic dysfunction (impaired relaxation).   2. Right ventricular systolic function is normal. The right ventricular  size is normal.   3. Left atrial size was mildly dilated.   4. The mitral valve is abnormal. Mild mitral valve regurgitation.   5. The aortic valve is normal in structure. Aortic valve regurgitation is  mild.    Echocardiogram 09/13/2020 [facility]): SUMMARY  Mild left ventricular hypertrophy  The left ventricular size is normal.  Left ventricular systolic function is mildly reduced.  LV ejection fraction = 45-50%.  The basal LV inferior wall is akinetic and aneurysmal. The base of the  inferoseptal wall also appears to be so. In the absence of coronary  artery disease, sarcoidosis can have such an appearance. Clinical  correlation is recommended.  The right ventricle is normal in size and function.  The left atrium is mildly dilated.  There is no significant valvular stenosis or regurgitation.  The ascending aorta is normal size.  The inferior vena cava was not visualized during the exam.  There is no pericardial effusion.  There is no comparison study available.   Assessment and Plan:  1.  Ischemic cardiomyopathy with  chronic combined heart failure.  LVEF 45 to 50% by last echocardiogram at [facility] in February.  No residual LV thrombus at that time.  Plan to repeat echocardiogram on current medical therapy and make adjustments from there.  Blood pressure has tended to limit up titration.  2.  Paroxysmal atrial fibrillation with CHA2DS2-VASc score of 7.  Remains on Eliquis for stroke prophylaxis.  Recent lab work reviewed.  No spontaneous bleeding problems reported.  3.  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  No active angina symptoms.  We have kept him on low-dose aspirin along with Crestor.  Medication Adjustments/Labs and Tests Ordered: Current medicines are reviewed at length with the patient today.  Concerns regarding medicines are outlined above.   Tests Ordered: Orders Placed This Encounter  Procedures   ECHOCARDIOGRAM COMPLETE     Medication Changes: No orders of the defined types were placed in this encounter.   Disposition:  Follow up  3 months.  Signed, Satira Sark, MD, [facility] 03/04/2021 10:33 AM    [facility] [place]. [address] [place], [place] [address] Phone: [phone]; Fax: [phone]

## 2021-03-04 ENCOUNTER — Encounter: Payer: Self-pay | Admitting: Cardiology

## 2021-03-04 ENCOUNTER — Other Ambulatory Visit: Payer: Self-pay

## 2021-03-04 ENCOUNTER — Ambulatory Visit (INDEPENDENT_AMBULATORY_CARE_PROVIDER_SITE_OTHER): Payer: Medicare HMO | Admitting: Cardiology

## 2021-03-04 VITALS — BP 138/66 | HR 76 | Ht 70.5 in | Wt 164.0 lb

## 2021-03-04 DIAGNOSIS — I480 Paroxysmal atrial fibrillation: Secondary | ICD-10-CM | POA: Diagnosis not present

## 2021-03-04 DIAGNOSIS — I25119 Atherosclerotic heart disease of native coronary artery with unspecified angina pectoris: Secondary | ICD-10-CM | POA: Diagnosis not present

## 2021-03-04 DIAGNOSIS — I255 Ischemic cardiomyopathy: Secondary | ICD-10-CM

## 2021-03-04 NOTE — Patient Instructions (Signed)
Medication Instructions:  Your physician recommends that you continue on your current medications as directed. Please refer to the Current Medication list given to you today.  *If you need a refill on your cardiac medications before your next appointment, please call your pharmacy*   Lab Work: None today  If you have labs (blood work) drawn today and your tests are completely normal, you will receive your results only by: [place] (if you have MyChart) OR A paper copy in the mail If you have any lab test that is abnormal or we need to change your treatment, we will call you to review the results.   Testing/Procedures: Your physician has requested that you have an echocardiogram. Echocardiography is a painless test that uses sound waves to create images of your heart. It provides your doctor with information about the size and shape of your heart and how well your heart's chambers and valves are working. This procedure takes approximately one hour. There are no restrictions for this procedure.    Follow-Up: At [facility], you and your health needs are our priority.  As part of our continuing mission to provide you with exceptional heart care, we have created designated Provider Care Teams.  These Care Teams include your primary Cardiologist (physician) and Advanced Practice Providers (APPs -  Physician Assistants and Nurse Practitioners) who all work together to provide you with the care you need, when you need it.  We recommend signing up for the patient portal called "MyChart".  Sign up information is provided on this After Visit Summary.  MyChart is used to connect with patients for Virtual Visits (Telemedicine).  Patients are able to view lab/test results, encounter notes, upcoming appointments, etc.  Non-urgent messages can be sent to your provider as well.   To learn more about what you can do with MyChart, go to [email or web address].    Your next appointment:   3  month(s)  The format for your next appointment:   In Person  Provider:   Rozann Lesches, MD   Other Instructions   Eliquis 5 mg # 14, lot [id number], exp 12/2022

## 2021-03-11 DIAGNOSIS — N183 Chronic kidney disease, stage 3 unspecified: Secondary | ICD-10-CM | POA: Diagnosis not present

## 2021-03-11 DIAGNOSIS — I509 Heart failure, unspecified: Secondary | ICD-10-CM | POA: Diagnosis not present

## 2021-03-11 DIAGNOSIS — R2689 Other abnormalities of gait and mobility: Secondary | ICD-10-CM | POA: Diagnosis not present

## 2021-03-13 ENCOUNTER — Ambulatory Visit (INDEPENDENT_AMBULATORY_CARE_PROVIDER_SITE_OTHER): Payer: Medicare HMO | Admitting: Neurology

## 2021-03-13 ENCOUNTER — Encounter: Payer: Self-pay | Admitting: Neurology

## 2021-03-13 VITALS — BP 119/73 | HR 85 | Ht 70.0 in | Wt 164.0 lb

## 2021-03-13 DIAGNOSIS — F0150 Vascular dementia without behavioral disturbance: Secondary | ICD-10-CM

## 2021-03-13 DIAGNOSIS — R413 Other amnesia: Secondary | ICD-10-CM

## 2021-03-13 NOTE — Progress Notes (Signed)
[facility] [address] [place].  [address] [phone]       OFFICE FOLLOW UP VISIT NOTE  [name] Date of Birth:  Apr 25, 1946 Medical Record Number:  [id number]   Referring MD: Dr. Erlinda Hong Reason for Referral: Dementia  HPI: Initial visit 12/06/2020 :Patrick Miranda is a 23 Caucasian male seen today for initial office consultation visit following hospital admission for confusion.  History is obtained from the patient and wife and review of electronic medical records and I personally reviewed available pertinent imaging films in PACS.  He has past medical history of right pontine stroke in 2013, coronary artery disease s/p CABG December 21, hypertension, hyperlipidemia, cardiomyopathy, left ventricular thrombus and pulmonary emboli.  Patient was admitted to [facility] in February 2022 for altered mental status for couple of weeks.  He had some odd behavior and was confused on multiple occasions.  CT scan of the head was showed suspected subacute left frontal and old right parietal infarcts.  MRI scan showed some T2 shine through but no acute infarct and dislocations.  2D echo showed ejection fraction of 40% with a kinesis of the inferior and inferior septal base with aneurysmal dilatation.  This was improved compared with previous echo from January 2022 when there was apical thrombus which was no longer seen.  EEG showed mild diffuse slowing suggestive of encephalopathy but no seizures.  LDL cholesterol 25 mg percent.  Hemoglobin A1c was 6.8.  Patient had been on Coumadin which she had a tough time taking and was switched to Eliquis which she is not tolerating much better without bruising or bleeding.  Continues to have memory loss and cognitive impairment which is unchanged.  Patient not been evaluated for reversible causes of cognitive impairment and not been tried on medication like Aricept or Namenda yet.  Patient had a remote history of a right pontine  stroke in 2013 and had only mild speech difficulties which resolved completely.  He had no other strokes due to his recent cardiac surgery in December.  He had transient postop A. fib as well and a left ventricular thrombus on the echo which has since resolved.  There is no family history of Alzheimer's.  Patient has no history of seizures or significant head injury with loss of consciousness.  Patient's cognitive impairment is mostly related to short-term memory and remembering recent information.  He still mostly independent in activities of daily living and can attend to his own bodily needs.  He does get frustrated easily but there is no agitation, violent behavior, delusions or hallucinations.  He has not exhibited any unsafe behavior and does not wander off.  His balance is poor and he does use a cane and he fell once in the bathroom a month ago does not fall on a regular basis. Update 03/13/2021 ; He returns for follow-up after last visit 3 months ago.  He is accompanied by his wife.   They feel his memory is slightly worse.  He does get confused off and on at times.  He is still mostly independent with activities of daily living but he needs constant reminders and repeated reminders about recent staffing which he forgets.  He has not been driving.  Patient has been complaining of getting tired easily and hence does not walk a lot.  Wife feels at times when he is tired he can drag his feet.  He has had no falls or injuries.  There have been no delusions or  hallucinations noted.  He does get occasionally agitated but there has been no violent behavior.  He continues to live with his wife and daughter and have somebody around him all the time.  He did undergo lab work on 12/06/2020 which was significant for low vitamin B12 levels of 153 otherwise TSH, homocystine and RPR were normal.  EEG on 12/31/2020 was normal.  Patient had a fall and was seen in the ER on 02/17/2021 and had a CT scan of the head which showed  mild generalized atrophy and bilateral lower cortical infarcts with no acute abnormality.  On Mini-Mental status exam today he actually scored 27 out of 30 which is an improvement from last visit when he had scored 75/30. 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HCC)    Left ventricular mural thrombus    NSTEMI (non-ST elevated myocardial infarction) (HCC)    OSA on CPAP    Pulmonary emboli [facility])    May 2021   Skin cancer    Type 2 diabetes mellitus ([place])     Social History:  Social History   Socioeconomic History   Marital status: Married    Spouse name: [name]   Number of children: Not on file   Years of education: Not on file   Highest education level: Not on file  Occupational History   Not on file  Tobacco Use   Smoking status: Never   Smokeless tobacco: Never  Vaping Use   Vaping Use: Never used  Substance and Sexual Activity   Alcohol use: No   Drug use: No   Sexual activity: Not on file  Other Topics Concern   Not on file  Social History Narrative   Lives with wife at Daughter's home   Right Handed   Drinks very little caffeine daily   Social Determinants of Health   Financial Resource Strain: Not on file  Food Insecurity: Not on file  Transportation Needs: No Transportation Needs   Lack of Transportation (Medical): No   Lack of Transportation (Non-Medical): No  Physical Activity: Not on file  Stress: Not on file  Social Connections: Not on file  Intimate  Partner Violence: Not on file    Medications:   Current Outpatient Medications on File Prior to Visit  Medication Sig Dispense Refill   acetaminophen (TYLENOL) 325 MG tablet Take 2 tablets (650 mg total) by mouth every 6 (six) hours as needed for mild pain or headache (or Fever >/= 101).     apixaban (ELIQUIS) 5 MG TABS tablet TAKE 1 TABLET(5 MG) BY MOUTH TWICE DAILY 60 tablet 5   aspirin EC 81 MG EC tablet Take 1 tablet (81 mg total) by mouth daily. Swallow whole. 30 tablet 11   BD PEN NEEDLE NANO U/F 32G X 4 MM MISC 1 each by Other route daily.      Continuous Blood Gluc Sensor (FREESTYLE LIBRE 2  SENSOR) MISC See admin instructions.     fenofibrate 160 MG tablet Take 160 mg by mouth daily.     finasteride (PROSCAR) 5 MG tablet Take 5 mg by mouth daily.     HUMALOG KWIKPEN 100 UNIT/ML KwikPen Inject 5-8 Units into the skin with breakfast, with lunch, and with evening meal.     memantine (NAMENDA TITRATION PAK) tablet pack 5 mg/day for =1 week; 5 mg twice daily for =1 week; 15 mg/day given in 5 mg and 10 mg separated doses for =1 week; then 10 mg twice daily 49 tablet 12   memantine (NAMENDA) 10 MG tablet Take 1 tablet (10 mg total) by mouth 2 (two) times daily. 180 tablet 1   metFORMIN (GLUCOPHAGE) 500 MG tablet Take 500 mg by mouth 2 (two) times daily with a meal.     metoprolol succinate (TOPROL-XL) 25 MG 24 hr tablet TAKE 1 TABLET(25 MG) BY MOUTH DAILY AFTER AND SUPPER 90 tablet 1   NOVOLOG FLEXPEN 100 UNIT/ML FlexPen      ONETOUCH VERIO test strip 1 each by Other route in the morning, at noon, and at bedtime.   3   PFIZER-BIONT COVID-19 VAC-TRIS SUSP injection      rosuvastatin (CRESTOR) 20 MG tablet TAKE 1 TABLET(20 MG) BY MOUTH DAILY 90 tablet 3   spironolactone (ALDACTONE) 25 MG tablet Take 0.5 tablets (12.5 mg total) by mouth daily. 45 tablet 1   tamsulosin (FLOMAX) 0.4 MG CAPS capsule Take 1 capsule (0.4 mg total) by mouth daily. (Patient taking differently: Take 0.4 mg by mouth at  bedtime.) 30 capsule 0   TOUJEO SOLOSTAR 300 UNIT/ML Solostar Pen Inject 20 Units into the skin daily. (Patient taking differently: Inject 15 Units into the skin daily.)     No current facility-administered medications on file prior to visit.    Allergies:   Allergies  Allergen Reactions   Lipitor [Atorvastatin] Other (See Comments)    Muscle pain   Sitagliptin     Other reaction(s): myalgias    Physical Exam General: well developed, well nourished, seated, in no evident distress Head: head normocephalic and atraumatic.   Neck: supple with no carotid or supraclavicular bruits Cardiovascular: regular rate and rhythm, no murmurs Musculoskeletal: no deformity Skin:  no rash/petichiae Vascular:  Normal pulses all extremities  Neurologic Exam Mental Status: Awake and fully alert. Oriented to place and time. Recent and remote memory diminished. Attention span, concentration and fund of knowledge poor mood and affect appropriate.  Mini-Mental status exam scored 22/30 with deficits in recall, orientation attention calculation reading and writing.  Clock drawing 3/4.  Able to copy intersecting pentagons.  Able to name only 4 animals which can walk on 4 legs. Cranial Nerves: Fundoscopic exam reveals sharp disc margins. Pupils equal, briskly reactive to light. Extraocular movements full without nystagmus. Visual fields full to confrontation. Hearing significantly diminished bilaterally facial sensation intact. Face, tongue, palate moves normally and symmetrically.  Motor: Normal bulk and tone. Normal strength in all tested extremity muscles except mild weakness of bilateral grip and hip flexors.. Sensory.: intact to touch , pinprick , position and vibratory sensation.  Coordination: Rapid alternating movements normal in all extremities. Finger-to-nose and heel-to-shin performed accurately bilaterally. Gait and Station: Arises from chair with slight difficulty. Stance is stooped l. Gait  demonstrates normal stride length and only slight imbalance .  Not able to heel, toe and tandem walk    Reflexes: 1+ and symmetric. Toes downgoing.   NIHSS  1 Modified  [place]  2  MMSE - Mini Mental State Exam 03/13/2021 12/06/2020  Orientation to time 3 4  Orientation to Place 4 5  Registration 3 3  Attention/ Calculation 5 2  Recall 3 1  Language- name 2 objects 2 2  Language- repeat 1 0  Language- follow 3 step command 3 3  Language- read & follow direction 1 1  Write a sentence 1 0  Copy design 1 1  Total score 27 22      ASSESSMENT: 75 year old Caucasian male with cognitive deterioration and memory loss for the last for 5 months following cardiac surgery likely due to mild vascular dementia.  He has shown some improvement on starting Namenda and vitamin B12 replacement history of remote right pontine infarct in 2013 and suspect left frontal and right parietal infarcts following cardiac surgery in December 2021.  Vascular risk factors of coronary artery disease, left ventricular thrombus, perioperative atrial fibrillation, diabetes, hypertension, hyperlipidemia and age     PLAN: I had a long discussion with the patient, his wife and daughter regarding his memory loss and cognitive impairment which appears to be stable and he seems to be tolerating the current dose of Namenda well without side effects.  Continue Namenda 10 mg twice daily.  Continue vitamin B12 replacement as per primary physician and repeat B12 level today to look for adequacy of replacement.  Continue participation in cognitively challenging activities like solving crossword puzzles, playing bridge and sodoku.  We also discussed memory compensation strategies return for follow-up in the future in 6 months or call earlier if necessary. Greater than 50% time during this 35-minute  visit was spent in counseling and coordination of care about is memory loss and vascular dementia as well as discussion about stroke prevention  and treatment of dementia and answering questions Antony Contras, MD Note: This document was prepared with digital dictation and possible smart phrase technology. Any transcriptional errors that result from this process are unintentional.

## 2021-03-13 NOTE — Patient Instructions (Signed)
I had a long discussion with the patient, his wife and daughter regarding his memory loss and cognitive impairment which appears to be stable and he seems to be tolerating the current dose of Namenda well without side effects.  Continue Namenda 10 mg twice daily.  Continue participation in cognitively challenging activities like solving crossword puzzles, playing bridge and sodoku.  We also discussed memory compensation strategies return for follow-up in the future in 6 months or call earlier if necessary. Memory Compensation Strategies  Use "WARM" strategy.  W= write it down  A= associate it  R= repeat it  M= make a mental note  2.   You can keep a Social worker.  Use a 3-ring notebook with sections for the following: calendar, important names and phone numbers,  medications, doctors' names/phone numbers, lists/reminders, and a section to journal what you did  each day.   3.    Use a calendar to write appointments down.  4.    Write yourself a schedule for the day.  This can be placed on the calendar or in a separate section of the Memory Notebook.  Keeping a  regular schedule can help memory.  5.    Use medication organizer with sections for each day or morning/evening pills.  You may need help loading it  6.    Keep a basket, or pegboard by the door.  Place items that you need to take out with you in the basket or on the pegboard.  You may also want to  include a message board for reminders.  7.    Use sticky notes.  Place sticky notes with reminders in a place where the task is performed.  For example: " turn off the  stove" placed by the stove, "lock the door" placed on the door at eye level, " take your medications" on  the bathroom mirror or by the place where you normally take your medications.  8.    Use alarms/timers.  Use while cooking to remind yourself to check on food or as a reminder to take your medicine, or as a  reminder to make a call, or as a reminder to perform  another task, etc.

## 2021-03-14 ENCOUNTER — Telehealth: Payer: Self-pay | Admitting: Emergency Medicine

## 2021-03-14 LAB — VITAMIN B12: Vitamin B-12: >2000 pg/mL — ABNORMAL HIGH (ref 232–1245)

## 2021-03-14 NOTE — Telephone Encounter (Signed)
-----   Message from Garvin Fila, MD sent at 03/14/2021  6:51 AM EDT ----- Mitchell Heir inform patient that vitamin b 12 levels were satisfactory ----- Message ----- From: Interface, [facility] Lab Results In Sent: 03/14/2021   5:37 AM EDT To: Garvin Fila, MD

## 2021-03-27 ENCOUNTER — Ambulatory Visit (HOSPITAL_COMMUNITY)
Admission: RE | Admit: 2021-03-27 | Discharge: 2021-03-27 | Disposition: A | Discharge status: Home / Self Care | Payer: Medicare HMO | Source: Ambulatory Visit | Attending: Student | Admitting: Student

## 2021-03-27 ENCOUNTER — Other Ambulatory Visit: Payer: Self-pay

## 2021-03-27 DIAGNOSIS — I255 Ischemic cardiomyopathy: Secondary | ICD-10-CM | POA: Diagnosis not present

## 2021-03-27 LAB — ECHOCARDIOGRAM COMPLETE
AR max vel: 2.46 cm2
AV Area VTI: 2.65 cm2
AV Area mean vel: 2.73 cm2
AV Mean grad: 4 mmHg
AV Peak grad: 9.7 mmHg
Ao pk vel: 1.56 m/s
Area-P 1/2: 3.15 cm2
Calc EF: 46.6 %
MV VTI: 3.16 cm2
P 1/2 time: 426 msec
S' Lateral: 4 cm
Single Plane A2C EF: 55 %
Single Plane A4C EF: 34 %

## 2021-03-28 ENCOUNTER — Other Ambulatory Visit: Payer: Self-pay

## 2021-03-28 ENCOUNTER — Telehealth: Payer: Self-pay | Admitting: *Deleted

## 2021-03-28 DIAGNOSIS — Z79899 Other long term (current) drug therapy: Secondary | ICD-10-CM

## 2021-03-28 DIAGNOSIS — I255 Ischemic cardiomyopathy: Secondary | ICD-10-CM

## 2021-03-28 MED ORDER — LOSARTAN POTASSIUM 25 MG PO TABS
12.5000 mg | ORAL_TABLET | Freq: Every day | ORAL | 1 refills | Status: DC
Start: 2021-03-28 — End: 2021-09-19

## 2021-03-28 NOTE — Telephone Encounter (Signed)
Patient informed and verbalized understanding of plan. Lab order faxed to [place] Copy sent to PCP

## 2021-03-28 NOTE — Telephone Encounter (Signed)
Pt's wife returned call  

## 2021-03-28 NOTE — Patient Outreach (Signed)
[facility]) Care Management  03/28/2021  [place] 11-Apr-1946 [id number]   Telephone call to patient for disease management follow up.   No answer.  HIPAA compliant voice message left.    Plan: If no return call, RN CM will attempt patient again in December.  Jone Baseman, RN, MSN [facility] [facility] Care Management Coordinator Direct Line [phone] Cell [phone] Toll Free: [phone]  Fax: [phone]

## 2021-03-28 NOTE — Telephone Encounter (Signed)
-----   Message from Satira Sark, MD sent at 03/27/2021  5:03 PM EDT ----- Results reviewed.  LVEF relatively stable at 45% with wall motion abnormalities consistent with ischemic heart disease.  No obvious mural thrombus described.  Let's try addition of low-dose Cozaar 12.5 mg daily to further round out cardiomyopathy regimen.  Not certain that he would tolerate Entresto given blood pressures.  Check BMET 7-10 days after starting.

## 2021-03-29 DIAGNOSIS — R310 Gross hematuria: Secondary | ICD-10-CM | POA: Diagnosis not present

## 2021-03-29 DIAGNOSIS — R350 Frequency of micturition: Secondary | ICD-10-CM | POA: Diagnosis not present

## 2021-03-29 DIAGNOSIS — R3915 Urgency of urination: Secondary | ICD-10-CM | POA: Diagnosis not present

## 2021-03-29 DIAGNOSIS — R3912 Poor urinary stream: Secondary | ICD-10-CM | POA: Diagnosis not present

## 2021-04-03 DIAGNOSIS — L570 Actinic keratosis: Secondary | ICD-10-CM | POA: Diagnosis not present

## 2021-04-03 DIAGNOSIS — L820 Inflamed seborrheic keratosis: Secondary | ICD-10-CM | POA: Diagnosis not present

## 2021-04-03 DIAGNOSIS — L821 Other seborrheic keratosis: Secondary | ICD-10-CM | POA: Diagnosis not present

## 2021-04-03 DIAGNOSIS — D692 Other nonthrombocytopenic purpura: Secondary | ICD-10-CM | POA: Diagnosis not present

## 2021-04-03 DIAGNOSIS — L738 Other specified follicular disorders: Secondary | ICD-10-CM | POA: Diagnosis not present

## 2021-04-05 ENCOUNTER — Other Ambulatory Visit: Payer: Self-pay | Admitting: Neurology

## 2021-04-09 ENCOUNTER — Other Ambulatory Visit (HOSPITAL_COMMUNITY)
Admission: RE | Admit: 2021-04-09 | Discharge: 2021-04-09 | Disposition: A | Discharge status: Home / Self Care | Payer: Medicare HMO | Source: Ambulatory Visit | Attending: Cardiology | Admitting: Cardiology

## 2021-04-09 ENCOUNTER — Other Ambulatory Visit: Payer: Self-pay

## 2021-04-09 DIAGNOSIS — R935 Abnormal findings on diagnostic imaging of other abdominal regions, including retroperitoneum: Secondary | ICD-10-CM | POA: Diagnosis not present

## 2021-04-09 DIAGNOSIS — F0150 Vascular dementia without behavioral disturbance: Secondary | ICD-10-CM | POA: Diagnosis not present

## 2021-04-09 DIAGNOSIS — I255 Ischemic cardiomyopathy: Secondary | ICD-10-CM | POA: Insufficient documentation

## 2021-04-09 DIAGNOSIS — Z79899 Other long term (current) drug therapy: Secondary | ICD-10-CM | POA: Insufficient documentation

## 2021-04-09 DIAGNOSIS — Z9049 Acquired absence of other specified parts of digestive tract: Secondary | ICD-10-CM | POA: Diagnosis not present

## 2021-04-09 DIAGNOSIS — R197 Diarrhea, unspecified: Secondary | ICD-10-CM | POA: Diagnosis not present

## 2021-04-09 LAB — BASIC METABOLIC PANEL
Anion gap: 4 — ABNORMAL LOW (ref 5–15)
BUN: 19 mg/dL (ref 8–23)
CO2: 29 mmol/L (ref 22–32)
Calcium: 8.2 mg/dL — ABNORMAL LOW (ref 8.9–10.3)
Chloride: 103 mmol/L (ref 98–111)
Creatinine, Ser: 1.49 mg/dL — ABNORMAL HIGH (ref 0.61–1.24)
GFR, Estimated: 48 mL/min — ABNORMAL LOW (ref >60)
Glucose, Bld: 293 mg/dL — ABNORMAL HIGH (ref 70–99)
Potassium: 4.2 mmol/L (ref 3.5–5.1)
Sodium: 136 mmol/L (ref 135–145)

## 2021-04-11 DIAGNOSIS — I509 Heart failure, unspecified: Secondary | ICD-10-CM | POA: Diagnosis not present

## 2021-04-11 DIAGNOSIS — R197 Diarrhea, unspecified: Secondary | ICD-10-CM | POA: Diagnosis not present

## 2021-04-11 DIAGNOSIS — N183 Chronic kidney disease, stage 3 unspecified: Secondary | ICD-10-CM | POA: Diagnosis not present

## 2021-04-11 DIAGNOSIS — R2689 Other abnormalities of gait and mobility: Secondary | ICD-10-CM | POA: Diagnosis not present

## 2021-04-15 ENCOUNTER — Telehealth: Payer: Self-pay | Admitting: *Deleted

## 2021-04-15 DIAGNOSIS — N1831 Chronic kidney disease, stage 3a: Secondary | ICD-10-CM

## 2021-04-15 DIAGNOSIS — I10 Essential (primary) hypertension: Secondary | ICD-10-CM

## 2021-04-15 NOTE — Telephone Encounter (Signed)
Per wife Patrick Miranda) - requesting samples of the Eliquis 5mg  - in the doughnut whole.

## 2021-04-15 NOTE — Telephone Encounter (Signed)
Laurine Blazer, LPN  04/04/568  7:94 PM EDT Back to Top    Notified, copy to pcp.  He will do at [facility] prior to next [place] on 05/08/2021.  Order entered.

## 2021-04-15 NOTE — Telephone Encounter (Signed)
-----   Message from Satira Sark, MD sent at 04/09/2021 11:13 AM EDT ----- Results reviewed.  Lab work shows normal potassium at 4.2, creatinine has bumped up to 1.49 after recent start of low-dose losartan.  Let's try to continue for now, he needs a repeat BMET for his next visit.

## 2021-04-16 MED ORDER — APIXABAN 5 MG PO TABS: 5.0000 mg | ORAL_TABLET | Freq: Two times a day (BID) | ORAL | 0 refills | Status: DC

## 2021-04-16 NOTE — Telephone Encounter (Signed)
Wife [name]) notified samples ready for pick up.  Assistance application enclosed as well.  She verbalized understanding.

## 2021-04-16 NOTE — Telephone Encounter (Signed)
Pt requesting Eliquis 5mg  samples: Indication:PAF Last office visit: 03/04/21  Myles Gip MD Scr: 1.49/1.52 on 04/09/21 Age: 75 Weight: 74.4kg  Based on above findings Eliquis 5mg  twice daily is the appropriate dose.  OK to provide samples if available.

## 2021-04-19 ENCOUNTER — Telehealth: Payer: Self-pay | Admitting: Cardiology

## 2021-04-19 NOTE — Telephone Encounter (Signed)
   [facility] Pre-operative Risk Assessment    Patient Name: Patrick Miranda  DOB: [birth year] MRN: [id number]  HEARTCARE STAFF:  - IMPORTANT!!!!!! Under Visit Info/Reason for Call, type in Other and utilize the format Clearance MM/DD/YY or Clearance TBD. Do not use dashes or single digits. - Please review there is not already an duplicate clearance open for this procedure. - If request is for dental extraction, please clarify the # of teeth to be extracted. - If the patient is currently at the dentist's office, call Pre-Op Callback Staff (MA/nurse) to input urgent request.  - If the patient is not currently in the dentist office, please route to the Pre-Op pool.  Request for surgical clearance:  What type of surgery is being performed? TURP  When is this surgery scheduled? TBD  What type of clearance is required (medical clearance vs. Pharmacy clearance to hold med vs. Both)? Both  Are there any medications that need to be held prior to surgery and how long? Eliquis 48 hrs. Prior to surgery. Aspirin 5 days prior to surgery.  Practice name and name of physician performing surgery? [facility]/ Dr. Claudia Desanctis  What is the office phone number? [phone]   7.   What is the office fax number? [phone]  8.   Anesthesia type (None, local, MAC, general) ? Not specified   Alvin Critchley 04/19/2021, 4:10 PM  _________________________________________________________________   (provider comments below)

## 2021-04-22 DIAGNOSIS — Z125 Encounter for screening for malignant neoplasm of prostate: Secondary | ICD-10-CM | POA: Diagnosis not present

## 2021-04-22 DIAGNOSIS — E785 Hyperlipidemia, unspecified: Secondary | ICD-10-CM | POA: Diagnosis not present

## 2021-04-22 DIAGNOSIS — E1139 Type 2 diabetes mellitus with other diabetic ophthalmic complication: Secondary | ICD-10-CM | POA: Diagnosis not present

## 2021-04-23 NOTE — Telephone Encounter (Signed)
Patient with diagnosis of A Fib on Eliquis for anticoagulation.    Procedure: TURP Date of procedure: TBD   CHA2DS2-VASc Score = 8  This indicates a 10.8% annual risk of stroke. The patient's score is based upon: CHF History: 1 HTN History: 1 Diabetes History: 1 Stroke History: 2 Vascular Disease History: 1 Age Score: 2 Gender Score: 0    CrCl 44 mL/min Platelet count 481K  Patient with atrial fibrillation and history of stroke.  Will route to Dr Domenic Polite for input

## 2021-04-23 NOTE — Telephone Encounter (Signed)
   Primary Cardiologist: Rozann Lesches, MD  Chart reviewed as part of pre-operative protocol coverage. Given past medical history and time since last visit, based on ACC/AHA guidelines, [place] would be at acceptable risk for the planned procedure without further cardiovascular testing.   Patient with diagnosis of A Fib on Eliquis for anticoagulation.     Procedure: TURP Date of procedure: TBD     CHA2DS2-VASc Score = 8  This indicates a 10.8% annual risk of stroke. The patient's score is based upon: CHF History: 1 HTN History: 1 Diabetes History: 1 Stroke History: 2 Vascular Disease History: 1 Age Score: 2 Gender Score: 0     CrCl 44 mL/min Platelet count 481K   Patient with atrial fibrillation and history of stroke.  His aspirin may be held for 5 days prior to his surgery.    As far as Eliquis is concerned, would hold for 48 hours.  Given high relative stroke risk with CHA2DS2-VASc score of 8, give single dose of Lovenox on the morning before the operation.  Please resume aspirin and Eliquis as soon as hemostasis is achieved at the discretion of the surgeon.  I will route this recommendation to the requesting party via Epic fax function and remove from pre-op pool.  Please call with questions.  [name]. Prim Morace NP-C    04/23/2021, 12:30 PM [place] [address] Office [phone] Fax [phone]

## 2021-04-23 NOTE — Telephone Encounter (Signed)
Patrick Miranda 75 year old male is requesting preoperative cardiac evaluation for TURP procedure.  He was last seen in the clinic on 03/04/2021.  During that time he continued to be limited in his functional capacity.  He denied falls.  His weight was down around 6 pounds.  His medications were reviewed.  His echocardiogram showed an LVEF of 45-50% by [facility] done in February.  Follow-up was planned for 3 months   His PMH includes ischemic cardiomyopathy, paroxysmal atrial fibrillation CHA2DS2-VASc score 7, and multivessel coronary artery disease status post CABG 12/21.   May his aspirin be held prior to its procedure?  Please direct your response to CV DIV preop pool.  Thank you for your help.  [name]. Osborn Pullin NP-C    04/23/2021, 11:10 AM [place] [address] Office [phone] Fax [phone]

## 2021-04-29 DIAGNOSIS — Z0000 Encounter for general adult medical examination without abnormal findings: Secondary | ICD-10-CM | POA: Diagnosis not present

## 2021-04-29 DIAGNOSIS — Z23 Encounter for immunization: Secondary | ICD-10-CM | POA: Diagnosis not present

## 2021-04-29 DIAGNOSIS — Z1331 Encounter for screening for depression: Secondary | ICD-10-CM | POA: Diagnosis not present

## 2021-04-29 DIAGNOSIS — M1990 Unspecified osteoarthritis, unspecified site: Secondary | ICD-10-CM | POA: Diagnosis not present

## 2021-04-29 DIAGNOSIS — Z1389 Encounter for screening for other disorder: Secondary | ICD-10-CM | POA: Diagnosis not present

## 2021-04-29 DIAGNOSIS — R2689 Other abnormalities of gait and mobility: Secondary | ICD-10-CM | POA: Diagnosis not present

## 2021-04-29 DIAGNOSIS — E1151 Type 2 diabetes mellitus with diabetic peripheral angiopathy without gangrene: Secondary | ICD-10-CM | POA: Diagnosis not present

## 2021-04-29 DIAGNOSIS — Z1212 Encounter for screening for malignant neoplasm of rectum: Secondary | ICD-10-CM | POA: Diagnosis not present

## 2021-04-29 DIAGNOSIS — I4891 Unspecified atrial fibrillation: Secondary | ICD-10-CM | POA: Diagnosis not present

## 2021-04-29 DIAGNOSIS — E1139 Type 2 diabetes mellitus with other diabetic ophthalmic complication: Secondary | ICD-10-CM | POA: Diagnosis not present

## 2021-04-29 DIAGNOSIS — I10 Essential (primary) hypertension: Secondary | ICD-10-CM | POA: Diagnosis not present

## 2021-04-29 DIAGNOSIS — E785 Hyperlipidemia, unspecified: Secondary | ICD-10-CM | POA: Diagnosis not present

## 2021-04-29 DIAGNOSIS — R82998 Other abnormal findings in urine: Secondary | ICD-10-CM | POA: Diagnosis not present

## 2021-04-29 DIAGNOSIS — D649 Anemia, unspecified: Secondary | ICD-10-CM | POA: Diagnosis not present

## 2021-04-30 ENCOUNTER — Other Ambulatory Visit: Payer: Self-pay | Admitting: Student

## 2021-05-06 ENCOUNTER — Other Ambulatory Visit: Payer: Self-pay | Admitting: Urology

## 2021-05-07 DIAGNOSIS — H2513 Age-related nuclear cataract, bilateral: Secondary | ICD-10-CM | POA: Diagnosis not present

## 2021-05-07 DIAGNOSIS — E119 Type 2 diabetes mellitus without complications: Secondary | ICD-10-CM | POA: Diagnosis not present

## 2021-05-07 DIAGNOSIS — H524 Presbyopia: Secondary | ICD-10-CM | POA: Diagnosis not present

## 2021-05-07 NOTE — Patient Instructions (Addendum)
DUE TO COVID-19 ONLY ONE VISITOR IS ALLOWED TO COME WITH YOU AND STAY IN THE WAITING ROOM ONLY DURING PRE OP AND PROCEDURE.   **NO VISITORS ARE ALLOWED IN THE SHORT STAY AREA OR RECOVERY ROOM!!**  IF YOU WILL BE ADMITTED INTO THE HOSPITAL YOU ARE ALLOWED ONLY TWO SUPPORT PEOPLE DURING VISITATION HOURS ONLY (10AM -8PM)   The support person(s) may change daily. The support person(s) must pass our screening, gel in and out, and wear a mask at all times, including in the patient's room. Patients must also wear a mask when staff or their support person are in the room.  No visitors under the age of 68. Any visitor under the age of 29 must be accompanied by an adult.    COVID SWAB TESTING MUST BE COMPLETED ON:  05/10/21 **MUST PRESENT COMPLETED FORM AT TESTING SITE**    [place] [address] [place] (backside of the building) You are not required to quarantine, however you are required to wear a well-fitted mask when you are out and around people not in your household.  Hand Hygiene often Do NOT share personal items Notify your provider if you are in close contact with someone who has COVID or you develop fever 100.4 or greater, new onset of sneezing, cough, sore throat, shortness of breath or body aches.  [address] [place], [address], must go inside of the hospital, NOT A DRIVE THRU!  (Must self quarantine after testing. Follow instructions on handout.)       Your procedure is scheduled on: 05/14/21   Report to [facility] Main Entrance    Report to admitting at : 11:15 AM   Call this number if you have problems the morning of surgery [phone]   Do not eat food :After Midnight.   May have liquids until: 10:30 AM    day of surgery  CLEAR LIQUID DIET  Foods Allowed                                                                     Foods Excluded  Water, Black Coffee and tea, regular and decaf                              liquids that you cannot  Plain Jell-O in any flavor  (No red)                                           see through such as: Fruit ices (not with fruit pulp)                                     milk, soups, orange juice              Iced Popsicles (No red)  All solid food                                   Apple juices Sports drinks like Gatorade (No red) Lightly seasoned clear broth or consume(fat free) Sugar,  Sample Menu Breakfast                                Lunch                                     Supper Cranberry juice                    Beef broth                            Chicken broth Jell-O                                     Grape juice                           Apple juice Coffee or tea                        Jell-O                                      Popsicle                                                Coffee or tea                        Coffee or tea     Oral Hygiene is also important to reduce your risk of infection.                                    Remember - BRUSH YOUR TEETH THE MORNING OF SURGERY WITH YOUR REGULAR TOOTHPASTE   Do NOT smoke after Midnight   Take these medicines the morning of surgery with A SIP OF WATER: metoprolol,budesonide,finasteride,memantine.  How to Manage Your Diabetes Before and After Surgery  Why is it important to control my blood sugar before and after surgery? Improving blood sugar levels before and after surgery helps healing and can limit problems. A way of improving blood sugar control is eating a healthy diet by:  Eating less sugar and carbohydrates  Increasing activity/exercise  Talking with your doctor about reaching your blood sugar goals High blood sugars (greater than 180 mg/dL) can raise your risk of infections and slow your recovery, so you will need to focus on controlling your diabetes during the weeks before surgery. Make sure that the doctor who takes care of your  diabetes knows about your planned surgery including the date and location.  How do I  manage my blood sugar before surgery? Check your blood sugar at least 4 times a day, starting 2 days before surgery, to make sure that the level is not too high or low. Check your blood sugar the morning of your surgery when you wake up and every 2 hours until you get to the Short Stay unit. If your blood sugar is less than 70 mg/dL, you will need to treat for low blood sugar: Do not take insulin. Treat a low blood sugar (less than 70 mg/dL) with  cup of clear juice (cranberry or apple), 4 glucose tablets, OR glucose gel. Recheck blood sugar in 15 minutes after treatment (to make sure it is greater than 70 mg/dL). If your blood sugar is not greater than 70 mg/dL on recheck, call [phone] for further instructions. Report your blood sugar to the short stay nurse when you get to Short Stay.  If you are admitted to the hospital after surgery: Your blood sugar will be checked by the staff and you will probably be given insulin after surgery (instead of oral diabetes medicines) to make sure you have good blood sugar levels. The goal for blood sugar control after surgery is 80-180 mg/dL.   WHAT DO I DO ABOUT MY DIABETES MEDICATION?  Do not take oral diabetes medicines (pills) the morning of surgery.  THE DAY BEFORE SURGERY, take metformin as usual. Novolog as usual for morning and afternoon dose,DO NOT take bedtime dose.Take toujeo insulin as usual in AM.       THE MORNING OF SURGERY, Take half of the dose of toujeo insulin.  If your CBG is greater than 220 mg/dL, you may take  of your sliding scale  (correction) dose of insulin.  DO NOT TAKE ANY ORAL DIABETIC MEDICATIONS DAY OF YOUR SURGERY                              You may not have any metal on your body including hair pins, jewelry, and body piercing             Do not wear lotions, powders, perfumes/cologne, or deodorant              Men may  shave face and neck.   Do not bring valuables to the hospital. [place].   Contacts, dentures or bridgework may not be worn into surgery.   Bring small overnight bag day of surgery.    Patients discharged on the day of surgery will not be allowed to drive home.   Special Instructions: Bring a copy of your healthcare power of attorney and living will documents         the day of surgery if you haven't scanned them before.              Please read over the following fact sheets you were given: IF YOU HAVE QUESTIONS ABOUT YOUR PRE-OP INSTRUCTIONS PLEASE CALL [phone]   [facility] - Preparing for Surgery Before surgery, you can play an important role.  Because skin is not sterile, your skin needs to be as free of germs as possible.  You can reduce the number of germs on your skin by washing with CHG (chlorahexidine gluconate) soap before surgery.  CHG is an antiseptic cleaner which kills germs and bonds with the skin to continue killing germs  even after washing. Please DO NOT use if you have an allergy to CHG or antibacterial soaps.  If your skin becomes reddened/irritated stop using the CHG and inform your nurse when you arrive at Short Stay. Do not shave (including legs and underarms) for at least 48 hours prior to the first CHG shower.  You may shave your face/neck. Please follow these instructions carefully:  1.  Shower with CHG Soap the night before surgery and the  morning of Surgery.  2.  If you choose to wash your hair, wash your hair first as usual with your  normal  shampoo.  3.  After you shampoo, rinse your hair and body thoroughly to remove the  shampoo.                           4.  Use CHG as you would any other liquid soap.  You can apply chg directly  to the skin and wash                       Gently with a scrungie or clean washcloth.  5.  Apply the CHG Soap to your body ONLY FROM THE NECK DOWN.   Do not use on face/ open                            Wound or open sores. Avoid contact with eyes, ears mouth and genitals (private parts).                       Wash face,  Genitals (private parts) with your normal soap.             6.  Wash thoroughly, paying special attention to the area where your surgery  will be performed.  7.  Thoroughly rinse your body with warm water from the neck down.  8.  DO NOT shower/wash with your normal soap after using and rinsing off  the CHG Soap.                9.  Pat yourself dry with a clean towel.            10.  Wear clean pajamas.            11.  Place clean sheets on your bed the night of your first shower and do not  sleep with pets. Day of Surgery : Do not apply any lotions/deodorants the morning of surgery.  Please wear clean clothes to the hospital/surgery center.  FAILURE TO FOLLOW THESE INSTRUCTIONS MAY RESULT IN THE CANCELLATION OF YOUR SURGERY PATIENT SIGNATURE_________________________________  NURSE SIGNATURE__________________________________  ________________________________________________________________________

## 2021-05-08 ENCOUNTER — Encounter (HOSPITAL_COMMUNITY): Payer: Self-pay

## 2021-05-08 ENCOUNTER — Other Ambulatory Visit: Payer: Self-pay

## 2021-05-08 ENCOUNTER — Ambulatory Visit: Payer: Medicare HMO | Admitting: Student

## 2021-05-08 ENCOUNTER — Encounter (HOSPITAL_COMMUNITY)
Admission: RE | Admit: 2021-05-08 | Discharge: 2021-05-08 | Disposition: A | Discharge status: Home / Self Care | Payer: Medicare HMO | Source: Ambulatory Visit | Attending: Urology | Admitting: Urology

## 2021-05-08 DIAGNOSIS — G4733 Obstructive sleep apnea (adult) (pediatric): Secondary | ICD-10-CM | POA: Insufficient documentation

## 2021-05-08 DIAGNOSIS — Z8616 Personal history of COVID-19: Secondary | ICD-10-CM | POA: Insufficient documentation

## 2021-05-08 DIAGNOSIS — Z8673 Personal history of transient ischemic attack (TIA), and cerebral infarction without residual deficits: Secondary | ICD-10-CM | POA: Insufficient documentation

## 2021-05-08 DIAGNOSIS — K529 Noninfective gastroenteritis and colitis, unspecified: Secondary | ICD-10-CM | POA: Diagnosis not present

## 2021-05-08 DIAGNOSIS — Z01812 Encounter for preprocedural laboratory examination: Secondary | ICD-10-CM | POA: Insufficient documentation

## 2021-05-08 DIAGNOSIS — Z79899 Other long term (current) drug therapy: Secondary | ICD-10-CM | POA: Diagnosis not present

## 2021-05-08 DIAGNOSIS — Z7901 Long term (current) use of anticoagulants: Secondary | ICD-10-CM | POA: Diagnosis not present

## 2021-05-08 DIAGNOSIS — I129 Hypertensive chronic kidney disease with stage 1 through stage 4 chronic kidney disease, or unspecified chronic kidney disease: Secondary | ICD-10-CM | POA: Diagnosis not present

## 2021-05-08 DIAGNOSIS — Z951 Presence of aortocoronary bypass graft: Secondary | ICD-10-CM | POA: Diagnosis not present

## 2021-05-08 DIAGNOSIS — I2510 Atherosclerotic heart disease of native coronary artery without angina pectoris: Secondary | ICD-10-CM | POA: Insufficient documentation

## 2021-05-08 DIAGNOSIS — I255 Ischemic cardiomyopathy: Secondary | ICD-10-CM | POA: Insufficient documentation

## 2021-05-08 DIAGNOSIS — E1122 Type 2 diabetes mellitus with diabetic chronic kidney disease: Secondary | ICD-10-CM | POA: Insufficient documentation

## 2021-05-08 DIAGNOSIS — N400 Enlarged prostate without lower urinary tract symptoms: Secondary | ICD-10-CM | POA: Insufficient documentation

## 2021-05-08 DIAGNOSIS — Z794 Long term (current) use of insulin: Secondary | ICD-10-CM | POA: Diagnosis not present

## 2021-05-08 DIAGNOSIS — F0390 Unspecified dementia without behavioral disturbance: Secondary | ICD-10-CM | POA: Insufficient documentation

## 2021-05-08 DIAGNOSIS — N189 Chronic kidney disease, unspecified: Secondary | ICD-10-CM | POA: Diagnosis not present

## 2021-05-08 DIAGNOSIS — Z7984 Long term (current) use of oral hypoglycemic drugs: Secondary | ICD-10-CM | POA: Diagnosis not present

## 2021-05-08 DIAGNOSIS — Z7982 Long term (current) use of aspirin: Secondary | ICD-10-CM | POA: Insufficient documentation

## 2021-05-08 DIAGNOSIS — R935 Abnormal findings on diagnostic imaging of other abdominal regions, including retroperitoneum: Secondary | ICD-10-CM | POA: Diagnosis not present

## 2021-05-08 HISTORY — DX: Unspecified dementia, unspecified severity, without behavioral disturbance, psychotic disturbance, mood disturbance, and anxiety: F03.90

## 2021-05-08 HISTORY — DX: Cardiac arrhythmia, unspecified: I49.9

## 2021-05-08 LAB — BASIC METABOLIC PANEL
Anion gap: 5 (ref 5–15)
BUN: 20 mg/dL (ref 8–23)
CO2: 25 mmol/L (ref 22–32)
Calcium: 8.5 mg/dL — ABNORMAL LOW (ref 8.9–10.3)
Chloride: 111 mmol/L (ref 98–111)
Creatinine, Ser: 1.21 mg/dL (ref 0.61–1.24)
GFR, Estimated: >60 mL/min (ref >60)
Glucose, Bld: 247 mg/dL — ABNORMAL HIGH (ref 70–99)
Potassium: 3.9 mmol/L (ref 3.5–5.1)
Sodium: 141 mmol/L (ref 135–145)

## 2021-05-08 LAB — CBC
HCT: 34.6 % — ABNORMAL LOW (ref 39.0–52.0)
Hemoglobin: 10.9 g/dL — ABNORMAL LOW (ref 13.0–17.0)
MCH: 30 pg (ref 26.0–34.0)
MCHC: 31.5 g/dL (ref 30.0–36.0)
MCV: 95.3 fL (ref 80.0–100.0)
Platelets: 389 10*3/uL (ref 150–400)
RBC: 3.63 MIL/uL — ABNORMAL LOW (ref 4.22–5.81)
RDW: 15 % (ref 11.5–15.5)
WBC: 6.7 10*3/uL (ref 4.0–10.5)
nRBC: 0 % (ref 0.0–0.2)

## 2021-05-08 LAB — HEMOGLOBIN A1C
Hgb A1c MFr Bld: 8.8 % — ABNORMAL HIGH (ref 4.8–5.6)
Mean Plasma Glucose: 205.86 mg/dL

## 2021-05-08 LAB — GLUCOSE, CAPILLARY: Glucose-Capillary: 235 mg/dL — ABNORMAL HIGH (ref 70–99)

## 2021-05-08 NOTE — Progress Notes (Signed)
COVID Vaccine Completed: Yes Date COVID Vaccine completed: 05/06/21 COVID vaccine manufacturer: [place]  x 4    PCP - Dr. Burnard Bunting Cardiologist - Dr. Rozann Lesches  Chest x-ray - 10/03/20 EKG - 10/17/20 Stress Test -  ECHO - 03/27/21 Cardiac Cath -  Pacemaker/ICD device last checked:  Sleep Study - Yes CPAP - Yes  Fasting Blood Sugar - 200's Checks Blood Sugar 4  times a day  Blood Thinner Instructions:Eliquis can be held 48 hrs. Before surgery. Aspirin can be held 5 days before surgery.As per cardiologist instructions. Aspirin Instructions: Last Dose:  Anesthesia review: Hx: HTN,DIA,CAD,OSA(CPAP),PE,Afib  Patient denies shortness of breath, fever, cough and chest pain at PAT appointment   Patient verbalized understanding of instructions that were given to them at the PAT appointment. Patient was also instructed that they will need to review over the PAT instructions again at home before surgery.

## 2021-05-08 NOTE — Progress Notes (Signed)
Lab. Results: A1C: 8.8.

## 2021-05-09 ENCOUNTER — Telehealth: Payer: Self-pay | Admitting: Pharmacist

## 2021-05-09 ENCOUNTER — Encounter (HOSPITAL_COMMUNITY): Payer: Self-pay

## 2021-05-09 DIAGNOSIS — R935 Abnormal findings on diagnostic imaging of other abdominal regions, including retroperitoneum: Secondary | ICD-10-CM | POA: Diagnosis not present

## 2021-05-09 DIAGNOSIS — K529 Noninfective gastroenteritis and colitis, unspecified: Secondary | ICD-10-CM | POA: Diagnosis not present

## 2021-05-09 DIAGNOSIS — I4891 Unspecified atrial fibrillation: Secondary | ICD-10-CM

## 2021-05-09 DIAGNOSIS — R197 Diarrhea, unspecified: Secondary | ICD-10-CM | POA: Diagnosis not present

## 2021-05-09 MED ORDER — ENOXAPARIN SODIUM 120 MG/0.8ML IJ SOSY: PREFILLED_SYRINGE | INTRAMUSCULAR | 0 refills | Status: DC

## 2021-05-09 NOTE — Telephone Encounter (Signed)
Sending single dose of Lovenox to pharmacy for morning before TURP procedure

## 2021-05-09 NOTE — Anesthesia Preprocedure Evaluation (Addendum)
Anesthesia Evaluation  Patient identified by MRN, date of birth, ID band Patient awake    Reviewed: Allergy & Precautions, NPO status , Patient's Chart, lab work & pertinent test results  Airway Mallampati: II  TM Distance: >3 FB Neck ROM: Full    Dental  (+) Dental Advisory Given, Teeth Intact   Pulmonary sleep apnea ,    Pulmonary exam normal breath sounds clear to auscultation       Cardiovascular hypertension, Pt. on home beta blockers and Pt. on medications + CAD and + Past MI  + dysrhythmias + Valvular Problems/Murmurs AI  Rhythm:Regular Rate:Normal  Echo 03/2021 1. Akinesis of the inferior/inferoseptal base with aneurysmal dilitation. Apical hypokinesis. Left ventricular ejection fraction, by estimation, is 45%. The left ventricle has mildly decreased function. The left ventricle demonstrates regional wall motion abnormalities (see scoring diagram/findings for description). Left  ventricular diastolic parameters are consistent with Grade I diastolic dysfunction (impaired relaxation).  2. Right ventricular systolic function is normal. The right ventricular size is normal. There is normal pulmonary artery systolic pressure.  3. The mitral valve is normal in structure. Trivial mitral valve regurgitation. No evidence of mitral stenosis.  4. The aortic valve has an indeterminant number of cusps. Aortic valve regurgitation is mild. No aortic stenosis is present.    Neuro/Psych PSYCHIATRIC DISORDERS Depression Dementia CVA    GI/Hepatic negative GI ROS, Neg liver ROS,   Endo/Other  diabetes, Poorly Controlled, Type 2  Renal/GU Renal disease     Musculoskeletal  (+) Arthritis ,   Abdominal   Peds  Hematology  (+) Blood dyscrasia, anemia ,   Anesthesia Other Findings   Reproductive/Obstetrics                           Anesthesia Physical Anesthesia Plan  ASA: 3  Anesthesia Plan: General    Post-op Pain Management:    Induction: Intravenous  PONV Risk Score and Plan: 3 and Ondansetron, Dexamethasone and Treatment may vary due to age or medical condition  Airway Management Planned: LMA  Additional Equipment: None  Intra-op Plan:   Post-operative Plan: Extubation in OR  Informed Consent: I have reviewed the patients History and Physical, chart, labs and discussed the procedure including the risks, benefits and alternatives for the proposed anesthesia with the patient or authorized representative who has indicated his/her understanding and acceptance.     Dental advisory given  Plan Discussed with: CRNA  Anesthesia Plan Comments: (See APP note by Durel Salts, FNP )      Anesthesia Quick Evaluation

## 2021-05-09 NOTE — Progress Notes (Addendum)
Anesthesia Chart Review:   Case: [id number] Date/Time: 05/14/21 1330   Procedure: TRANSURETHRAL RESECTION OF THE PROSTATE (TURP) - 75 MINS   Anesthesia type: General   Pre-op diagnosis: BENIGN PROSTATIC HYPERPLASIA   Location: [place] / [facility]   Surgeons: Robley Fries, MD       DISCUSSION: Pt is 75 years old with hx CAD (s/p CABG 2021), ischemic cardiomyopathy (EF 45-50% by Echo 09/13/20), atrial fibrillation, HTN, DM, PE, OSA, CKD, stroke, dementia  Hospitalized 2/23-09/26/20 at [facility] for jejunal perforation, s/p small bowel resection  DM is poorly controlled with A1c 8.8 and pt reports fasting glucose at home is in 200's. I left voicemail for [facility] in Dr. Keane Scrape office about DM status.   Pt to hold eliquis 2 days before surgery   VS: BP 116/65   Pulse 94   Temp 36.7 C (Oral)   Ht 5' 10.5" (1.791 m)   Wt 73.5 kg   SpO2 100%   BMI 22.92 kg/m   PROVIDERS: - PCP is Burnard Bunting, MD - Cardiologist is Rozann Lesches, MD. Last office visit 03/04/21. Pt cleared for surgery at acceptable risk by Coletta Memos, NP on 04/23/21 - Neurologist is Antony Contras, MD. Last office visit 03/13/21  LABS:  - glucose 235, A1c 8.8  (all labs ordered are listed, but only abnormal results are displayed)  Labs Reviewed  CBC - Abnormal; Notable for the following components:      Result Value   RBC 3.63 (*)    Hemoglobin 10.9 (*)    HCT 34.6 (*)    All other components within normal limits  BASIC METABOLIC PANEL - Abnormal; Notable for the following components:   Glucose, Bld 247 (*)    Calcium 8.5 (*)    All other components within normal limits  HEMOGLOBIN A1C - Abnormal; Notable for the following components:   Hgb A1c MFr Bld 8.8 (*)    All other components within normal limits  GLUCOSE, CAPILLARY - Abnormal; Notable for the following components:   Glucose-Capillary 235 (*)    All other components within normal limits     IMAGES: CXR 10/03/20: No active  cardiopulmonary disease   EKG 10/17/20: Sinus rhythm with incomplete left bundle branch block and repolarization abnormalities   CV: Echo 03/27/21:  1. Akinesis of the inferior/inferoseptal base with aneurysmal dilitation. Apical hypokinesis. Left ventricular ejection fraction, by estimation, is 45%. The left ventricle has mildly decreased function. The left ventricle demonstrates regional wall motion abnormalities (see scoring diagram/findings for description). Left ventricular diastolic parameters are consistent with Grade I diastolic  dysfunction (impaired relaxation).  2. Right ventricular systolic function is normal. The right ventricular size is normal. There is normal pulmonary artery systolic pressure.  3. The mitral valve is normal in structure. Trivial mitral valve regurgitation. No evidence of mitral stenosis.  4. The aortic valve has an indeterminant number of cusps. Aortic valve regurgitation is mild. No aortic stenosis is present.     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 s/p CABG in 06/2020 with LIMA-LAD, SVG-D1 and SVG-RCA   COVID-07 Dec 2019   Dementia [facility])    Depression    Dysrhythmia    Essential hypertension    Hernia, umbilical    HOH (hard of hearing)    Hyperlipidemia    Ischemic cardiomyopathy  Ischemic necrosis of small bowel (HCC)    Left ventricular mural thrombus    NSTEMI (non-ST elevated myocardial infarction) (HCC)    OSA on CPAP    Pulmonary emboli [facility])    May 2021   Skin cancer    Type 2 diabetes mellitus ([place])     Past Surgical History:  Procedure Laterality Date   COLON SURGERY     COLONOSCOPY W/ POLYPECTOMY     CORONARY ARTERY BYPASS GRAFT N/A 07/09/2020   Procedure: CORONARY ARTERY BYPASS GRAFTING (CABG) TIMES THREE , USING LEFT INTERNAL MAMMARY ARTERY AND RIGHT LEG GREATER SAPHENOUS VEIN HARVESTED ENDOSCOPICALLY;   Surgeon: Ivin Poot, MD;  Location: [place];  Service: Open Heart Surgery;  Laterality: N/A;   LEFT HEART CATH AND CORONARY ANGIOGRAPHY N/A 07/04/2020   Procedure: LEFT HEART CATH AND CORONARY ANGIOGRAPHY;  Surgeon: Troy Sine, MD;  Location: [place] CV [facility];  Service: Cardiovascular;  Laterality: N/A;   TEE WITHOUT CARDIOVERSION N/A 07/09/2020   Procedure: TRANSESOPHAGEAL ECHOCARDIOGRAM (TEE);  Surgeon: Prescott Gum, Collier Salina, MD;  Location: [place];  Service: Open Heart Surgery;  Laterality: N/A;   TOTAL KNEE ARTHROPLASTY Left 04/22/2013   Procedure: TOTAL KNEE ARTHROPLASTY- left;  Surgeon: Augustin Schooling, MD;  Location: [place];  Service: Orthopedics;  Laterality: Left;  with femoral block   TOTAL KNEE ARTHROPLASTY Right 01/06/2014   Procedure: RIGHT TOTAL KNEE ARTHROPLASTY;  Surgeon: Augustin Schooling, MD;  Location: [place];  Service: Orthopedics;  Laterality: Right;    MEDICATIONS:  acetaminophen (TYLENOL) 325 MG tablet   apixaban (ELIQUIS) 5 MG TABS tablet   aspirin EC 81 MG EC tablet   BD PEN NEEDLE NANO U/F 32G X 4 MM MISC   Biotin 5000 MCG TABS   budesonide (ENTOCORT EC) 3 MG 24 hr capsule   Continuous Blood Gluc Sensor (FREESTYLE LIBRE 2 SENSOR) MISC   Cyanocobalamin (B-12) 5000 MCG SUBL   fenofibrate 160 MG tablet   finasteride (PROSCAR) 5 MG tablet   Insulin Aspart (NOVOLOG FLEXPEN [place])   Lactobacillus Rhamnosus, GG, (CULTURELLE PO)   losartan (COZAAR) 25 MG tablet   memantine (NAMENDA) 10 MG tablet   metFORMIN (GLUCOPHAGE) 500 MG tablet   metoprolol succinate (TOPROL-XL) 25 MG 24 hr tablet   Multiple Vitamin (MULTIVITAMIN WITH MINERALS) TABS tablet   ONETOUCH VERIO test strip   rosuvastatin (CRESTOR) 20 MG tablet   spironolactone (ALDACTONE) 25 MG tablet   tamsulosin (FLOMAX) 0.4 MG CAPS capsule   TOUJEO SOLOSTAR 300 UNIT/ML Solostar Pen   No current facility-administered medications for this encounter.   - Pt to hold eliquis 2 days before surgery.   If glucose  acceptable day of surgery, I anticipate pt can proceed with surgery as scheduled.  Willeen Cass, PhD, FNP-BC [facility] Short Stay Surgical Center/Anesthesiology Phone: [phone] 05/09/2021 10:54 AM

## 2021-05-10 ENCOUNTER — Other Ambulatory Visit: Payer: Self-pay | Admitting: Urology

## 2021-05-11 DIAGNOSIS — R2689 Other abnormalities of gait and mobility: Secondary | ICD-10-CM | POA: Diagnosis not present

## 2021-05-11 DIAGNOSIS — I509 Heart failure, unspecified: Secondary | ICD-10-CM | POA: Diagnosis not present

## 2021-05-11 DIAGNOSIS — N183 Chronic kidney disease, stage 3 unspecified: Secondary | ICD-10-CM | POA: Diagnosis not present

## 2021-05-11 LAB — SARS CORONAVIRUS 2 (TAT 6-24 HRS): SARS Coronavirus 2: NEGATIVE

## 2021-05-13 ENCOUNTER — Telehealth: Payer: Self-pay | Admitting: Cardiology

## 2021-05-13 NOTE — Telephone Encounter (Signed)
Contacted pts wife and verbalized that before pt's procedure in pre admit, an EKG will be performed to determine if the pt is able to go through with his TURP. Pt's spouse voiced understanding.

## 2021-05-13 NOTE — Telephone Encounter (Signed)
Patient c/o Palpitations:  High priority if patient c/o lightheadedness, shortness of breath, or chest pain  How long have you had palpitations/irregular HR/ Afib? Are you having the symptoms now? No  Are you currently experiencing lightheadedness, SOB or CP? no  Do you have a history of afib (atrial fibrillation) or irregular heart rhythm? yes  Have you checked your BP or HR? (document readings if available):   Are you experiencing any other symptoms? Patient states he only gets SOB when he has the afib.   Wife of the patient called. The patient had a couple short spells of afib this weekend. She is concerned because he is supposed to have a procedure done tomorrow and she does not know if the recent afib would affect his ability to have the procedure. Please advise

## 2021-05-14 ENCOUNTER — Observation Stay (HOSPITAL_COMMUNITY)
Admission: RE | Admit: 2021-05-14 | Discharge: 2021-05-15 | Disposition: A | Discharge status: Home / Self Care | Payer: Medicare HMO | Source: Ambulatory Visit | Attending: Urology | Admitting: Urology

## 2021-05-14 ENCOUNTER — Encounter (HOSPITAL_COMMUNITY)
Admission: RE | Disposition: A | Discharge status: Home / Self Care | Payer: Self-pay | Source: Ambulatory Visit | Attending: Urology

## 2021-05-14 ENCOUNTER — Ambulatory Visit (HOSPITAL_COMMUNITY): Payer: Medicare HMO | Admitting: Anesthesiology

## 2021-05-14 ENCOUNTER — Encounter (HOSPITAL_COMMUNITY): Payer: Self-pay | Admitting: Urology

## 2021-05-14 ENCOUNTER — Ambulatory Visit (HOSPITAL_COMMUNITY): Payer: Medicare HMO | Admitting: Emergency Medicine

## 2021-05-14 ENCOUNTER — Other Ambulatory Visit: Payer: Self-pay

## 2021-05-14 DIAGNOSIS — I10 Essential (primary) hypertension: Secondary | ICD-10-CM | POA: Diagnosis not present

## 2021-05-14 DIAGNOSIS — R350 Frequency of micturition: Secondary | ICD-10-CM | POA: Insufficient documentation

## 2021-05-14 DIAGNOSIS — N138 Other obstructive and reflux uropathy: Secondary | ICD-10-CM | POA: Diagnosis present

## 2021-05-14 DIAGNOSIS — E119 Type 2 diabetes mellitus without complications: Secondary | ICD-10-CM | POA: Insufficient documentation

## 2021-05-14 DIAGNOSIS — Z7901 Long term (current) use of anticoagulants: Secondary | ICD-10-CM | POA: Diagnosis not present

## 2021-05-14 DIAGNOSIS — I2510 Atherosclerotic heart disease of native coronary artery without angina pectoris: Secondary | ICD-10-CM | POA: Diagnosis not present

## 2021-05-14 DIAGNOSIS — Z79899 Other long term (current) drug therapy: Secondary | ICD-10-CM | POA: Insufficient documentation

## 2021-05-14 DIAGNOSIS — Z7984 Long term (current) use of oral hypoglycemic drugs: Secondary | ICD-10-CM | POA: Insufficient documentation

## 2021-05-14 DIAGNOSIS — R3912 Poor urinary stream: Secondary | ICD-10-CM | POA: Diagnosis not present

## 2021-05-14 DIAGNOSIS — G4733 Obstructive sleep apnea (adult) (pediatric): Secondary | ICD-10-CM | POA: Diagnosis not present

## 2021-05-14 DIAGNOSIS — N401 Enlarged prostate with lower urinary tract symptoms: Principal | ICD-10-CM | POA: Insufficient documentation

## 2021-05-14 DIAGNOSIS — Z794 Long term (current) use of insulin: Secondary | ICD-10-CM | POA: Diagnosis not present

## 2021-05-14 DIAGNOSIS — Z9989 Dependence on other enabling machines and devices: Secondary | ICD-10-CM | POA: Diagnosis not present

## 2021-05-14 DIAGNOSIS — R3915 Urgency of urination: Secondary | ICD-10-CM | POA: Diagnosis not present

## 2021-05-14 DIAGNOSIS — N400 Enlarged prostate without lower urinary tract symptoms: Secondary | ICD-10-CM | POA: Diagnosis not present

## 2021-05-14 DIAGNOSIS — G9341 Metabolic encephalopathy: Secondary | ICD-10-CM | POA: Diagnosis not present

## 2021-05-14 HISTORY — PX: TRANSURETHRAL RESECTION OF PROSTATE: SHX73

## 2021-05-14 LAB — GLUCOSE, CAPILLARY
Glucose-Capillary: 195 mg/dL — ABNORMAL HIGH (ref 70–99)
Glucose-Capillary: 191 mg/dL — ABNORMAL HIGH (ref 70–99)
Glucose-Capillary: 268 mg/dL — ABNORMAL HIGH (ref 70–99)

## 2021-05-14 SURGERY — TURP (TRANSURETHRAL RESECTION OF PROSTATE)
Anesthesia: General

## 2021-05-14 MED ORDER — DEXAMETHASONE SODIUM PHOSPHATE 10 MG/ML IJ SOLN
INTRAMUSCULAR | Status: DC | PRN
  Administered 2021-05-14: 5 mg via INTRAVENOUS

## 2021-05-14 MED ORDER — INSULIN GLARGINE (1 UNIT DIAL) 300 UNIT/ML ~~LOC~~ SOPN: 15.0000 [IU] | PEN_INJECTOR | Freq: Every day | SUBCUTANEOUS | Status: DC

## 2021-05-14 MED ORDER — B-12 5000 MCG SL SUBL: 5000.0000 ug | SUBLINGUAL_TABLET | Freq: Every day | SUBLINGUAL | Status: DC

## 2021-05-14 MED ORDER — ASPIRIN EC 81 MG PO TBEC
81.0000 mg | DELAYED_RELEASE_TABLET | Freq: Every day | ORAL | Status: DC
  Administered 2021-05-15: 81 mg via ORAL
  Filled 2021-05-14: qty 1

## 2021-05-14 MED ORDER — APIXABAN 5 MG PO TABS
5.0000 mg | ORAL_TABLET | Freq: Two times a day (BID) | ORAL | Status: DC
  Administered 2021-05-15: 5 mg via ORAL
  Filled 2021-05-14: qty 1

## 2021-05-14 MED ORDER — SODIUM CHLORIDE 0.9% FLUSH: 3.0000 mL | INTRAVENOUS | Status: DC | PRN

## 2021-05-14 MED ORDER — SPIRONOLACTONE 12.5 MG HALF TABLET
12.5000 mg | ORAL_TABLET | Freq: Every day | ORAL | Status: DC
  Administered 2021-05-15: 12.5 mg via ORAL
  Filled 2021-05-14: qty 1

## 2021-05-14 MED ORDER — PROPOFOL 10 MG/ML IV BOLUS
INTRAVENOUS | Status: DC | PRN
  Administered 2021-05-14: 80 mg via INTRAVENOUS

## 2021-05-14 MED ORDER — BIOTIN 5000 MCG PO TABS: 5000.0000 ug | ORAL_TABLET | Freq: Every day | ORAL | Status: DC

## 2021-05-14 MED ORDER — LOSARTAN POTASSIUM 25 MG PO TABS
12.5000 mg | ORAL_TABLET | Freq: Every day | ORAL | Status: DC
  Administered 2021-05-15: 12.5 mg via ORAL
  Filled 2021-05-14: qty 1

## 2021-05-14 MED ORDER — INSULIN PEN NEEDLE 32G X 4 MM MISC: 1.0000 | Freq: Every day | Status: DC

## 2021-05-14 MED ORDER — INSULIN ASPART 100 UNIT/ML FLEXPEN: 0.0000 [IU] | PEN_INJECTOR | Freq: Three times a day (TID) | SUBCUTANEOUS | Status: DC

## 2021-05-14 MED ORDER — SODIUM CHLORIDE 0.9 % IV SOLN: 250.0000 mL | INTRAVENOUS | Status: DC | PRN

## 2021-05-14 MED ORDER — MIDAZOLAM HCL 2 MG/2ML IJ SOLN
INTRAMUSCULAR | Status: AC
  Filled 2021-05-14: qty 2

## 2021-05-14 MED ORDER — OXYCODONE HCL 5 MG/5ML PO SOLN: 5.0000 mg | Freq: Once | ORAL | Status: DC | PRN

## 2021-05-14 MED ORDER — SODIUM CHLORIDE 0.9% FLUSH: 3.0000 mL | Freq: Two times a day (BID) | INTRAVENOUS | Status: DC

## 2021-05-14 MED ORDER — ONDANSETRON HCL 4 MG/2ML IJ SOLN
INTRAMUSCULAR | Status: DC | PRN
  Administered 2021-05-14: 4 mg via INTRAVENOUS

## 2021-05-14 MED ORDER — OXYCODONE HCL 5 MG PO TABS: 5.0000 mg | ORAL_TABLET | ORAL | Status: DC | PRN

## 2021-05-14 MED ORDER — SENNOSIDES-DOCUSATE SODIUM 8.6-50 MG PO TABS
2.0000 | ORAL_TABLET | Freq: Every day | ORAL | Status: DC
  Administered 2021-05-14: 2 via ORAL
  Filled 2021-05-14: qty 2

## 2021-05-14 MED ORDER — VITAMIN B-12 1000 MCG PO TABS
5000.0000 ug | ORAL_TABLET | Freq: Every day | ORAL | Status: DC
  Administered 2021-05-15: 5000 ug via ORAL
  Filled 2021-05-14: qty 5

## 2021-05-14 MED ORDER — FENTANYL CITRATE (PF) 100 MCG/2ML IJ SOLN
INTRAMUSCULAR | Status: DC | PRN
  Administered 2021-05-14 (×2): 50 ug via INTRAVENOUS

## 2021-05-14 MED ORDER — CEFAZOLIN SODIUM-DEXTROSE 2-4 GM/100ML-% IV SOLN
2.0000 g | Freq: Three times a day (TID) | INTRAVENOUS | Status: AC
Start: 2021-05-14 — End: 2021-05-15
  Administered 2021-05-14 – 2021-05-15 (×2): 2 g via INTRAVENOUS
  Filled 2021-05-14 (×2): qty 100

## 2021-05-14 MED ORDER — MORPHINE SULFATE (PF) 2 MG/ML IV SOLN: 2.0000 mg | INTRAVENOUS | Status: DC | PRN

## 2021-05-14 MED ORDER — DIPHENHYDRAMINE HCL 50 MG/ML IJ SOLN: 12.5000 mg | Freq: Four times a day (QID) | INTRAMUSCULAR | Status: DC | PRN

## 2021-05-14 MED ORDER — PHENYLEPHRINE 40 MCG/ML (10ML) SYRINGE FOR IV PUSH (FOR BLOOD PRESSURE SUPPORT)
PREFILLED_SYRINGE | INTRAVENOUS | Status: AC
  Filled 2021-05-14: qty 10

## 2021-05-14 MED ORDER — LACTATED RINGERS IV SOLN
INTRAVENOUS | Status: DC
  Administered 2021-05-14: 1000 mL via INTRAVENOUS

## 2021-05-14 MED ORDER — SODIUM CHLORIDE 0.9 % IR SOLN
Status: DC | PRN
  Administered 2021-05-14 (×6): 3000 mL via INTRAVESICAL

## 2021-05-14 MED ORDER — FENTANYL CITRATE PF 50 MCG/ML IJ SOSY
25.0000 ug | PREFILLED_SYRINGE | INTRAMUSCULAR | Status: DC | PRN
  Administered 2021-05-14 (×2): 50 ug via INTRAVENOUS

## 2021-05-14 MED ORDER — DEXTROSE-NACL 5-0.45 % IV SOLN: INTRAVENOUS | Status: DC

## 2021-05-14 MED ORDER — FENTANYL CITRATE PF 50 MCG/ML IJ SOSY
PREFILLED_SYRINGE | INTRAMUSCULAR | Status: AC
  Administered 2021-05-14: 50 ug via INTRAVENOUS
  Filled 2021-05-14: qty 3

## 2021-05-14 MED ORDER — ONDANSETRON HCL 4 MG/2ML IJ SOLN: 4.0000 mg | INTRAMUSCULAR | Status: DC | PRN

## 2021-05-14 MED ORDER — ACETAMINOPHEN 325 MG PO TABS: 650.0000 mg | ORAL_TABLET | ORAL | Status: DC | PRN

## 2021-05-14 MED ORDER — OXYCODONE HCL 5 MG PO TABS: 5.0000 mg | ORAL_TABLET | Freq: Once | ORAL | Status: DC | PRN

## 2021-05-14 MED ORDER — FENTANYL CITRATE (PF) 100 MCG/2ML IJ SOLN
INTRAMUSCULAR | Status: AC
  Filled 2021-05-14: qty 2

## 2021-05-14 MED ORDER — CEFAZOLIN SODIUM-DEXTROSE 2-4 GM/100ML-% IV SOLN
2.0000 g | INTRAVENOUS | Status: AC
  Administered 2021-05-14: 2 g via INTRAVENOUS
  Filled 2021-05-14: qty 100

## 2021-05-14 MED ORDER — METOPROLOL SUCCINATE ER 25 MG PO TB24
25.0000 mg | ORAL_TABLET | Freq: Every day | ORAL | Status: DC
  Administered 2021-05-15: 25 mg via ORAL
  Filled 2021-05-14: qty 1

## 2021-05-14 MED ORDER — CHLORHEXIDINE GLUCONATE 0.12 % MT SOLN
15.0000 mL | Freq: Once | OROMUCOSAL | Status: AC
  Administered 2021-05-14: 15 mL via OROMUCOSAL

## 2021-05-14 MED ORDER — BACITRACIN-NEOMYCIN-POLYMYXIN 400-5-5000 EX OINT: 1.0000 "application " | TOPICAL_OINTMENT | Freq: Three times a day (TID) | CUTANEOUS | Status: DC | PRN

## 2021-05-14 MED ORDER — LIDOCAINE 2% (20 MG/ML) 5 ML SYRINGE
INTRAMUSCULAR | Status: DC | PRN
  Administered 2021-05-14: 80 mg via INTRAVENOUS

## 2021-05-14 MED ORDER — BELLADONNA ALKALOIDS-OPIUM 16.2-60 MG RE SUPP: 1.0000 | Freq: Four times a day (QID) | RECTAL | Status: DC | PRN

## 2021-05-14 MED ORDER — INSULIN GLARGINE-YFGN 100 UNIT/ML ~~LOC~~ SOLN
15.0000 [IU] | Freq: Every day | SUBCUTANEOUS | Status: DC
  Administered 2021-05-15: 15 [IU] via SUBCUTANEOUS
  Filled 2021-05-14: qty 0.15

## 2021-05-14 MED ORDER — 0.9 % SODIUM CHLORIDE (POUR BTL) OPTIME
TOPICAL | Status: DC | PRN
  Administered 2021-05-14: 1000 mL

## 2021-05-14 MED ORDER — INSULIN ASPART 100 UNIT/ML IJ SOLN
0.0000 [IU] | Freq: Three times a day (TID) | INTRAMUSCULAR | Status: DC
  Administered 2021-05-15: 8 [IU] via SUBCUTANEOUS

## 2021-05-14 MED ORDER — CULTURELLE PO CAPS: ORAL_CAPSULE | Freq: Every day | ORAL | Status: DC

## 2021-05-14 MED ORDER — ORAL CARE MOUTH RINSE: 15.0000 mL | Freq: Once | OROMUCOSAL | Status: AC

## 2021-05-14 MED ORDER — DIPHENHYDRAMINE HCL 12.5 MG/5ML PO ELIX: 12.5000 mg | ORAL_SOLUTION | Freq: Four times a day (QID) | ORAL | Status: DC | PRN

## 2021-05-14 MED ORDER — FENOFIBRATE 160 MG PO TABS
160.0000 mg | ORAL_TABLET | Freq: Every day | ORAL | Status: DC
  Administered 2021-05-15: 160 mg via ORAL
  Filled 2021-05-14: qty 1

## 2021-05-14 MED ORDER — METFORMIN HCL 500 MG PO TABS
500.0000 mg | ORAL_TABLET | Freq: Two times a day (BID) | ORAL | Status: DC
  Administered 2021-05-15: 500 mg via ORAL
  Filled 2021-05-14: qty 1

## 2021-05-14 MED ORDER — TAMSULOSIN HCL 0.4 MG PO CAPS
0.4000 mg | ORAL_CAPSULE | Freq: Every day | ORAL | Status: DC
  Administered 2021-05-14: 0.4 mg via ORAL
  Filled 2021-05-14: qty 1

## 2021-05-14 MED ORDER — FINASTERIDE 5 MG PO TABS
5.0000 mg | ORAL_TABLET | Freq: Every day | ORAL | Status: DC
  Administered 2021-05-14: 5 mg via ORAL
  Filled 2021-05-14: qty 1

## 2021-05-14 MED ORDER — SODIUM CHLORIDE 0.9 % IR SOLN
3000.0000 mL | Status: DC
  Administered 2021-05-14: 3000 mL

## 2021-05-14 MED ORDER — MEMANTINE HCL 5 MG PO TABS
5.0000 mg | ORAL_TABLET | Freq: Two times a day (BID) | ORAL | Status: DC
  Administered 2021-05-14 – 2021-05-15 (×2): 5 mg via ORAL
  Filled 2021-05-14 (×2): qty 1

## 2021-05-14 MED ORDER — BUDESONIDE 3 MG PO CPEP
9.0000 mg | ORAL_CAPSULE | Freq: Every day | ORAL | Status: DC
  Administered 2021-05-15: 9 mg via ORAL
  Filled 2021-05-14: qty 3

## 2021-05-14 MED ORDER — PROPOFOL 10 MG/ML IV BOLUS
INTRAVENOUS | Status: AC
  Filled 2021-05-14: qty 20

## 2021-05-14 SURGICAL SUPPLY — 17 items
BAG DRN RND TRDRP ANRFLXCHMBR (UROLOGICAL SUPPLIES)
BAG URINE DRAIN 2000ML AR STRL (UROLOGICAL SUPPLIES) IMPLANT
BAG URO CATCHER STRL LF (MISCELLANEOUS) ×2 IMPLANT
CATH FOLEY 3WAY 30CC 22FR (CATHETERS) IMPLANT
CATH HEMA 3WAY 30CC 22FR COUDE (CATHETERS) IMPLANT
DRAPE FOOT SWITCH (DRAPES) ×2 IMPLANT
GLOVE SURG ENC MOIS LTX SZ6.5 (GLOVE) ×2 IMPLANT
HOLDER FOLEY CATH W/STRAP (MISCELLANEOUS) ×2 IMPLANT
LOOP CUT BIPOLAR 24F LRG (ELECTROSURGICAL) ×2 IMPLANT
MANIFOLD NEPTUNE II (INSTRUMENTS) ×2 IMPLANT
PACK CYSTO (CUSTOM PROCEDURE TRAY) ×2 IMPLANT
PLUG CATH AND CAP STER (CATHETERS) ×2 IMPLANT
SYR 30ML LL (SYRINGE) IMPLANT
SYR TOOMEY IRRIG 70ML (MISCELLANEOUS) ×2
SYRINGE TOOMEY IRRIG 70ML (MISCELLANEOUS) ×1 IMPLANT
TUBING CONNECTING 10 (TUBING) ×2 IMPLANT
TUBING UROLOGY SET (TUBING) ×2 IMPLANT

## 2021-05-14 NOTE — Anesthesia Postprocedure Evaluation (Signed)
Anesthesia Post Note  Patient: Patrick Miranda  Procedure(s) Performed: TRANSURETHRAL RESECTION OF THE PROSTATE (TURP)     Patient location during evaluation: PACU Anesthesia Type: General Level of consciousness: awake and alert and oriented Pain management: pain level controlled Vital Signs Assessment: post-procedure vital signs reviewed and stable Respiratory status: spontaneous breathing, nonlabored ventilation and respiratory function stable Cardiovascular status: blood pressure returned to baseline Postop Assessment: no apparent nausea or vomiting Anesthetic complications: no   No notable events documented.  Last Vitals:  Vitals:   05/14/21 1530 05/14/21 1545  BP: 119/65 (!) 116/56  Pulse: (!) 59 (!) 55  Resp: (!) 8 11  Temp:    SpO2: 95% 94%    Last Pain:  Vitals:   05/14/21 1215  TempSrc:   PainSc: 0-No pain                 Marthenia Rolling

## 2021-05-14 NOTE — Anesthesia Procedure Notes (Addendum)
Procedure Name: LMA Insertion Date/Time: 05/14/2021 2:30 PM Performed by: Gean Maidens, CRNA Pre-anesthesia Checklist: Patient identified, Emergency Drugs available, Suction available, Patient being monitored and Timeout performed Patient Re-evaluated:Patient Re-evaluated prior to induction Oxygen Delivery Method: Circle system utilized Preoxygenation: Pre-oxygenation with 100% oxygen Induction Type: IV induction Ventilation: Mask ventilation without difficulty LMA: LMA inserted LMA Size: 4.0 Number of attempts: 1 Placement Confirmation: positive ETCO2 and breath sounds checked- equal and bilateral Tube secured with: Tape Dental Injury: Teeth and Oropharynx as per pre-operative assessment

## 2021-05-14 NOTE — Progress Notes (Signed)
PHARMACIST - PHYSICIAN ORDER COMMUNICATION  CONCERNING: P&T Medication Policy on Herbal Medications  DESCRIPTION:  This patient's order for:  Biotin  has been noted.  This product(s) is classified as an "herbal" or natural product. Due to a lack of definitive safety studies or FDA approval, nonstandard manufacturing practices, plus the potential risk of unknown drug-drug interactions while on inpatient medications, the Pharmacy and Therapeutics Committee does not permit the use of "herbal" or natural products of this type within .   ACTION TAKEN: The pharmacy department is unable to verify this order at this time and your patient has been informed of this safety policy. Please reevaluate patient's clinical condition at discharge and address if the herbal or natural product(s) should be resumed at that time.  Zeniya Lapidus, PharmD 

## 2021-05-14 NOTE — H&P (Signed)
CC/HPI: cc: BPH with bladder outlet obstruction   08/17/2020: 75 year old man with recent hospitalization for abdominal pain found to have BPH with bladder outlet obstruction. Abdominal ultrasound showed bilateral hydronephrosis with thickened bladder wall. Patient was started on Flomax and discharge from hospital proximally week ago. His PVR today in the office is 176 cc. He reports slow to start stream, poor urinary control and weak stream. He denies any gross hematuria, UTIs or family history of prostate cancer. Patient had a CABG in December 2021 and remains on anticoagulation.   IPSS: 5, 4, 5, 5, 5, 5, 4=33/35  Terrible 6/6 QOL    10/08/20: 75 year old man with the above history returns for follow-up with repeat PVR. Last month he underwent urgent surgery for perforated bowel. He has since recovered from that 2 week hospital stay at [facility]. He remains on tamsulosin 0.4 mg daily and finasteride daily. PVR in the office today 212 cc.   11/08/20: 75 year old man with the above history comes in after his wife noticed that he is urinating more frequently than usual. Patient has been on Flomax and finasteride. He does have an elevated PVR at baseline. Urinalysis today without signs of infection.   02/07/21: 75 year old man with a history of BPH with lower urinary tract symptoms currently on Flomax and finasteride returns for follow-up with PVR. Patient's wife is noted blood in his underwear and urine for the last few weeks. Patient's PVR is 113 cc. He continues to experience significant frequency.   03/29/21: Cystoscopy     ALLERGIES: lipitor    MEDICATIONS: Aspirin 81 mg tablet,chewable  Finasteride 5 mg tablet  Metformin Hcl  Metoprolol Tartrate  Tamsulosin Hcl 0.4 mg capsule 1 capsule PO Q HS  Tamsulosin Hcl 0.4 mg capsule  Eliquis 5 mg tablet  Fenofibrate 160 mg tablet  Humalog Kwikpen U-100  Rosuvastatin Calcium 20 mg tablet  Toujeo Solostar 300 unit/ml (1.5 ml) insulin pen      GU PSH: Locm 300-399Mg /Ml Iodine,1Ml - 02/13/2021     NON-GU PSH: None   GU PMH: Gross hematuria - 02/13/2021, - 02/07/2021 BPH w/LUTS - 02/07/2021, - 11/08/2020, - 10/08/2020, - 08/17/2020 Incomplete bladder emptying - 02/07/2021, - 11/08/2020, - 10/08/2020 Urinary Frequency - 02/07/2021 Urinary Urgency - 02/07/2021, - 10/08/2020, - 08/17/2020 Weak Urinary Stream - 11/08/2020, - 10/08/2020, - 08/17/2020    NON-GU PMH: None   FAMILY HISTORY: None   SOCIAL HISTORY: Marital Status: Married Preferred Language: English; Ethnicity: Not Hispanic Or Latino; Race: White Current Smoking Status: Patient has never smoked.   Tobacco Use Assessment Completed: Used Tobacco in last 30 days? Has never drank.  Drinks 1 caffeinated drink per day.    REVIEW OF SYSTEMS:    GU Review Male:   Patient denies trouble starting your stream, stream starts and stops, burning/ pain with urination, get up at night to urinate, have to strain to urinate , erection problems, hard to postpone urination, penile pain, leakage of urine, and frequent urination.  Gastrointestinal (Upper):   Patient denies nausea, vomiting, and indigestion/ heartburn.  Gastrointestinal (Lower):   Patient denies diarrhea and constipation.  Constitutional:   Patient denies fever, night sweats, weight loss, and fatigue.  Skin:   Patient denies skin rash/ lesion and itching.  Eyes:   Patient denies blurred vision and double vision.  Ears/ Nose/ Throat:   Patient denies sore throat and sinus problems.  Hematologic/Lymphatic:   Patient denies swollen glands and easy bruising.  Cardiovascular:   Patient denies leg swelling and  chest pains.  Respiratory:   Patient denies cough and shortness of breath.  Endocrine:   Patient denies excessive thirst.  Musculoskeletal:   Patient denies back pain and joint pain.  Neurological:   Patient denies headaches and dizziness.  Psychologic:   Patient denies depression and anxiety.   VITAL SIGNS: None    MULTI-SYSTEM PHYSICAL EXAMINATION:    Constitutional: Well-nourished. No physical deformities. Normally developed. Good grooming.  Neck: Neck symmetrical, not swollen. Normal tracheal position.  Respiratory: No labored breathing, no use of accessory muscles.   Skin: No paleness, no jaundice, no cyanosis. No lesion, no ulcer, no rash.  Neurologic / Psychiatric: Oriented to time, oriented to place, oriented to person. No depression, no anxiety, no agitation.  Gastrointestinal: No rigidity, non obese abdomen.   Eyes: Normal conjunctivae. Normal eyelids.  Ears, Nose, Mouth, and Throat: Left ear no scars, no lesions, no masses. Right ear no scars, no lesions, no masses. Nose no scars, no lesions, no masses. Normal hearing. Normal lips.  Musculoskeletal: Normal gait and station of head and neck.     Complexity of Data:  Records Review:   Previous Patient Records  Urodynamics Review:   Review Bladder Scan, Review Flow Rate  X-Ray Review: C.T. Abdomen/Pelvis: Reviewed Films. Reviewed Report. Discussed With Patient.    Notes:                     PSA 1.351 04/12/20   PROCEDURES:         Flexible Cystoscopy - 52000  Risks, benefits, and some of the potential complications of the procedure were discussed at length with the patient including infection, bleeding, voiding discomfort, urinary retention, fever, chills, sepsis, and others. All questions were answered. Informed consent was obtained. Sterile technique and intraurethral analgesia were used.  Meatus:  Normal size. Normal location. Normal condition.  Urethra:  No strictures.  External Sphincter:  Normal.  Verumontanum:  Normal.  Prostate:  Obstructing. Enlarged median lobe.   Bladder Neck:  Non-obstructing.  Ureteral Orifices:  Normal location. Normal size. Normal shape. Effluxed clear urine.  Bladder:  Mild trabeculation. No tumors. Normal mucosa. No stones.      The lower urinary tract was carefully examined. The procedure was  well-tolerated and without complications. Antibiotic instructions were given. Instructions were given to call the office immediately for bloody urine, difficulty urinating, urinary retention, painful or frequent urination, fever, chills, nausea, vomiting or other illness. The patient stated that he understood these instructions and would comply with them.        Flow Rate - 51741  Average Flow Rate: 3.8 cc/sec  Flow Time: 0:15 min:sec  Peak Flow Rate: 6.3 cc/sec  Total Void Time: 0:16 min:sec  Voided Volume: 60 cc           PVR Ultrasound - 39767  Scanned Volume: 121 cc         Urinalysis Dipstick Dipstick Cont'd  Color: Yellow Bilirubin: Neg mg/dL  Appearance: Clear Ketones: Neg mg/dL  Specific Gravity: 1.025 Blood: Neg ery/uL  pH: 6.0 Protein: Neg mg/dL  Glucose: Trace mg/dL Urobilinogen: 0.2 mg/dL    Nitrites: Neg    Leukocyte Esterase: Neg leu/uL    ASSESSMENT:      ICD-10 Details  1 GU:   BPH w/LUTS - N40.1 Chronic, Stable  2   Gross hematuria - R31.0 Undiagnosed New Problem  3   Urinary Frequency - R35.0 Chronic, Stable  4   Urinary Urgency - R39.15 Chronic, Stable  5   Weak Urinary Stream - R39.12 Chronic, Stable   PLAN:           Document Letter(s):  Created for Patient: Clinical Summary         Notes:   1. Gross hematuria: No concerning upper tract lesions or bladder pathology seen on cystoscopy today. Patient was given a printed copy of radiology report which is reviewed with him and his wife. They are going to follow-up with his GI doctor.   2. BPH with lower urinary tract symptoms: Patient to remain on Flomax and finasteride. Cystoscopy did show obstructing prostate with intravesical median lobe. I discussed with patient the risks and benefits of a TURP. Patient is going to think about whether not he would like to proceed with surgery. He would also need cardiac clearance to come off of his blood thinner. He will call me over the next few weeks after he has  thought about surgery.

## 2021-05-14 NOTE — Progress Notes (Signed)
Patients home CPAP setup, sterile water filled to appropriate level, and plugged into red outlet in wall for patient. CPAP is at bedside within patients reach. Patient states he will place on/off when ready.

## 2021-05-14 NOTE — Discharge Instructions (Signed)
Post transurethral resection of the prostate (TURP) instructions ° °Your recent prostate surgery requires very special post hospital care. Despite the fact that no skin incisions were used the area around the prostate incision is quite raw and is covered with a scab to promote healing and prevent bleeding. Certain cautions are needed to assure that the scab is not disturbed of the next 2-3 weeks while the healing proceeds. ° °Because the raw surface in your prostate and the irritating effects of urine you may expect frequency of urination and/or urgency (a stronger desire to urinate) and perhaps even getting up at night more often. This will usually resolve or improve slowly over the healing period. You may see some blood in your urine over the first 6 weeks. Do not be alarmed, even if the urine was clear for a while. Get off your feet and drink lots of fluids until clearing occurs. If you start to pass clots or don't improve call us. ° °Catheter: (If you are discharged with a catheter.) °1. Keep your catheter secured to your leg at all times with tape or the supplied strap. °2. You may experience leakage of urine around your catheter- as long as the  °catheter continues to drain, this is normal.  If your catheter stops draining  °go to the ER. °3. You may also have blood in your urine, even after it has been clear for  °several days; you may even pass some small blood clots or other material.  This  °is normal as well.  If this happens, sit down and drink plenty of water to help  °make urine to flush out your bladder.  If the blood in your urine becomes worse  °after doing this, contact our office or return to the ER. °4. You may use the leg bag (small bag) during the day, but use the large bag at  °night. ° °Diet: ° °You may return to your normal diet immediately. Because of the raw surface of your bladder, alcohol, spicy foods, foods high in acid and drinks with caffeine may cause irritation or frequency and  should be used in moderation. To keep your urine flowing freely and avoid constipation, drink plenty of fluids during the day (8-10 glasses). Tip: Avoid cranberry juice because it is very acidic. ° °Activity: ° °Your physical activity doesn't need to be restricted. However, if you are very active, you may see some blood in the urine. We suggest that you reduce your activity under the circumstances until the bleeding has stopped. ° °Bowels: ° °It is important to keep your bowels regular during the postoperative period. Straining with bowel movements can cause bleeding. A bowel movement every other day is reasonable. Use a mild laxative if needed, such as milk of magnesia 2-3 tablespoons, or 2 Dulcolax tablets. Call if you continue to have problems. If you had been taking narcotics for pain, before, during or after your surgery, you may be constipated. Take a laxative if necessary. ° °Medication: ° °You should resume your pre-surgery medications unless told not to. DO NOT RESUME YOUR ASPIRIN, WARFARIN, OR OTHER BLOOD THINNER FOR 1 WEEK. In addition you may be given an antibiotic to prevent or treat infection. Antibiotics are not always necessary. All medication should be taken as prescribed until the bottles are finished unless you are having an unusual reaction to one of the drugs. ° ° ° ° °Problems you should report to us: ° °a. Fever greater than 101°F. °b. Heavy bleeding, or clots (see   notes above about blood in urine). °c. Inability to urinate. °d. Drug reactions (hives, rash, nausea, vomiting, diarrhea). °e. Severe burning or pain with urination that is not improving. ° ° °

## 2021-05-14 NOTE — Transfer of Care (Signed)
Immediate Anesthesia Transfer of Care Note  Patient: [place]  Procedure(s) Performed: TRANSURETHRAL RESECTION OF THE PROSTATE (TURP)  Patient Location: PACU  Anesthesia Type:General  Level of Consciousness: sedated, patient cooperative and responds to stimulation  Airway & Oxygen Therapy: Patient Spontanous Breathing and Patient connected to face mask oxygen  Post-op Assessment: Report given to RN and Post -op Vital signs reviewed and stable  Post vital signs: Reviewed and stable  Last Vitals:  Vitals Value Taken Time  BP 111/75 05/14/21 1515  Temp    Pulse 59 05/14/21 1517  Resp 12 05/14/21 1517  SpO2 100 % 05/14/21 1517  Vitals shown include unvalidated device data.  Last Pain:  Vitals:   05/14/21 1215  TempSrc:   PainSc: 0-No pain         Complications: No notable events documented.

## 2021-05-14 NOTE — Anesthesia Procedure Notes (Deleted)
Procedure Name: LMA Insertion Date/Time: 05/14/2021 2:34 PM Performed by: Gean Maidens, CRNA Pre-anesthesia Checklist: Patient identified, Emergency Drugs available, Suction available, Patient being monitored and Timeout performed Patient Re-evaluated:Patient Re-evaluated prior to induction Oxygen Delivery Method: Circle system utilized Preoxygenation: Pre-oxygenation with 100% oxygen Induction Type: IV induction Ventilation: Mask ventilation without difficulty LMA: LMA inserted LMA Size: 4.0 Number of attempts: 1 Placement Confirmation: positive ETCO2 and breath sounds checked- equal and bilateral Tube secured with: Tape Dental Injury: Teeth and Oropharynx as per pre-operative assessment

## 2021-05-14 NOTE — Op Note (Signed)
Preoperative diagnosis: Bladder outlet obstruction secondary to BPH  Postoperative diagnosis:  Bladder outlet obstruction secondary to BPH  Procedure:  Cystoscopy Transurethral resection of the prostate  Surgeon: Jacalyn Lefevre, M.D.  Anesthesia: General  Complications: None  EBL: Minimal  Specimens: Prostate chips  Indication: Patrick Miranda is a patient with bladder outlet obstruction secondary to benign prostatic hyperplasia. After reviewing the management options for treatment, he elected to proceed with the above surgical procedure(s). We have discussed the potential benefits and risks of the procedure, side effects of the proposed treatment, the likelihood of the patient achieving the goals of the procedure, and any potential problems that might occur during the procedure or recuperation. Informed consent has been obtained.  Description of procedure:  The patient was taken to the operating room and general anesthesia was induced.  The patient was placed in the dorsal lithotomy position, prepped and draped in the usual sterile fashion, and preoperative antibiotics were administered. A preoperative time-out was performed.   Cystourethroscopy was performed.  The patient's urethra was examined and demonstrated bilobar prostatic hypertrophy.   The bladder was then systematically examined in its entirety. There was no evidence of any bladder tumors, stones, or other mucosal pathology.  The ureteral orifices were identified and marked so as to be avoided during the procedure.  The prostate adenoma was then resected utilizing loop cautery resection with the bipolar cutting loop.  The prostate adenoma from the bladder neck back to the verumontanum was resected beginning at the six o'clock position and then extended to include the right and left lobes of the prostate and anterior prostate. Care was taken not to resect distal to the verumontanum.    Hemostasis was then achieved  with the cautery and the bladder was emptied and reinspected with no significant bleeding noted at the end of the procedure.    A 22Fr 3-way catheter was then placed into the bladder.  The patient appeared to tolerate the procedure well and without complications.  The patient was able to be awakened and transferred to the recovery unit in satisfactory condition.   Plan: Patient will be observed overnight.  If his urine remains clear he will restart Eliquis tomorrow.  Plan will be discharged home with Foley catheter in place tomorrow and void trial the following day in the office.

## 2021-05-14 NOTE — Interval H&P Note (Signed)
History and Physical Interval Note:  05/14/2021 1:09 PM  [place]  has presented today for surgery, with the diagnosis of BENIGN PROSTATIC HYPERPLASIA.  The various methods of treatment have been discussed with the patient and family. After consideration of risks, benefits and other options for treatment, the patient has consented to  Procedure(s) with comments: [place] (TURP) (N/A) - 90 MINS as a surgical intervention.  The patient's history has been reviewed, patient examined, no change in status, stable for surgery.  I have reviewed the patient's chart and labs.  Questions were answered to the patient's satisfaction.     Rilen Shukla D Antwane Grose

## 2021-05-15 ENCOUNTER — Encounter (HOSPITAL_COMMUNITY): Payer: Self-pay | Admitting: Urology

## 2021-05-15 ENCOUNTER — Emergency Department (HOSPITAL_COMMUNITY): Payer: Medicare HMO

## 2021-05-15 ENCOUNTER — Emergency Department (HOSPITAL_COMMUNITY)
Admission: EM | Admit: 2021-05-15 | Discharge: 2021-05-16 | Disposition: A | Discharge status: Home / Self Care | Payer: Medicare HMO | Source: Home / Self Care | Attending: Emergency Medicine | Admitting: Emergency Medicine

## 2021-05-15 DIAGNOSIS — I6381 Other cerebral infarction due to occlusion or stenosis of small artery: Secondary | ICD-10-CM | POA: Diagnosis not present

## 2021-05-15 DIAGNOSIS — D631 Anemia in chronic kidney disease: Secondary | ICD-10-CM | POA: Diagnosis not present

## 2021-05-15 DIAGNOSIS — F0390 Unspecified dementia without behavioral disturbance: Secondary | ICD-10-CM | POA: Insufficient documentation

## 2021-05-15 DIAGNOSIS — Z7901 Long term (current) use of anticoagulants: Secondary | ICD-10-CM | POA: Insufficient documentation

## 2021-05-15 DIAGNOSIS — Z96653 Presence of artificial knee joint, bilateral: Secondary | ICD-10-CM | POA: Insufficient documentation

## 2021-05-15 DIAGNOSIS — I4891 Unspecified atrial fibrillation: Secondary | ICD-10-CM | POA: Diagnosis not present

## 2021-05-15 DIAGNOSIS — R739 Hyperglycemia, unspecified: Secondary | ICD-10-CM | POA: Diagnosis not present

## 2021-05-15 DIAGNOSIS — I130 Hypertensive heart and chronic kidney disease with heart failure and stage 1 through stage 4 chronic kidney disease, or unspecified chronic kidney disease: Secondary | ICD-10-CM | POA: Diagnosis not present

## 2021-05-15 DIAGNOSIS — Z85828 Personal history of other malignant neoplasm of skin: Secondary | ICD-10-CM | POA: Insufficient documentation

## 2021-05-15 DIAGNOSIS — Z7984 Long term (current) use of oral hypoglycemic drugs: Secondary | ICD-10-CM | POA: Insufficient documentation

## 2021-05-15 DIAGNOSIS — R410 Disorientation, unspecified: Secondary | ICD-10-CM | POA: Diagnosis not present

## 2021-05-15 DIAGNOSIS — R3912 Poor urinary stream: Secondary | ICD-10-CM | POA: Diagnosis not present

## 2021-05-15 DIAGNOSIS — E1129 Type 2 diabetes mellitus with other diabetic kidney complication: Secondary | ICD-10-CM | POA: Diagnosis not present

## 2021-05-15 DIAGNOSIS — R404 Transient alteration of awareness: Secondary | ICD-10-CM | POA: Diagnosis not present

## 2021-05-15 DIAGNOSIS — R350 Frequency of micturition: Secondary | ICD-10-CM | POA: Diagnosis not present

## 2021-05-15 DIAGNOSIS — A419 Sepsis, unspecified organism: Secondary | ICD-10-CM | POA: Diagnosis not present

## 2021-05-15 DIAGNOSIS — E041 Nontoxic single thyroid nodule: Secondary | ICD-10-CM | POA: Diagnosis not present

## 2021-05-15 DIAGNOSIS — W050XXA Fall from non-moving wheelchair, initial encounter: Secondary | ICD-10-CM | POA: Insufficient documentation

## 2021-05-15 DIAGNOSIS — A4159 Other Gram-negative sepsis: Secondary | ICD-10-CM | POA: Diagnosis not present

## 2021-05-15 DIAGNOSIS — Z79899 Other long term (current) drug therapy: Secondary | ICD-10-CM | POA: Insufficient documentation

## 2021-05-15 DIAGNOSIS — Z20822 Contact with and (suspected) exposure to covid-19: Secondary | ICD-10-CM | POA: Diagnosis not present

## 2021-05-15 DIAGNOSIS — I10 Essential (primary) hypertension: Secondary | ICD-10-CM | POA: Insufficient documentation

## 2021-05-15 DIAGNOSIS — R6520 Severe sepsis without septic shock: Secondary | ICD-10-CM | POA: Diagnosis not present

## 2021-05-15 DIAGNOSIS — Z8616 Personal history of COVID-19: Secondary | ICD-10-CM | POA: Insufficient documentation

## 2021-05-15 DIAGNOSIS — I5022 Chronic systolic (congestive) heart failure: Secondary | ICD-10-CM | POA: Diagnosis not present

## 2021-05-15 DIAGNOSIS — B961 Klebsiella pneumoniae [K. pneumoniae] as the cause of diseases classified elsewhere: Secondary | ICD-10-CM | POA: Diagnosis present

## 2021-05-15 DIAGNOSIS — W06XXXA Fall from bed, initial encounter: Secondary | ICD-10-CM | POA: Diagnosis present

## 2021-05-15 DIAGNOSIS — I252 Old myocardial infarction: Secondary | ICD-10-CM | POA: Diagnosis not present

## 2021-05-15 DIAGNOSIS — N401 Enlarged prostate with lower urinary tract symptoms: Secondary | ICD-10-CM | POA: Diagnosis not present

## 2021-05-15 DIAGNOSIS — Z794 Long term (current) use of insulin: Secondary | ICD-10-CM | POA: Insufficient documentation

## 2021-05-15 DIAGNOSIS — I255 Ischemic cardiomyopathy: Secondary | ICD-10-CM | POA: Diagnosis present

## 2021-05-15 DIAGNOSIS — E1165 Type 2 diabetes mellitus with hyperglycemia: Secondary | ICD-10-CM | POA: Diagnosis not present

## 2021-05-15 DIAGNOSIS — M4802 Spinal stenosis, cervical region: Secondary | ICD-10-CM | POA: Diagnosis not present

## 2021-05-15 DIAGNOSIS — Z1611 Resistance to penicillins: Secondary | ICD-10-CM | POA: Diagnosis present

## 2021-05-15 DIAGNOSIS — G319 Degenerative disease of nervous system, unspecified: Secondary | ICD-10-CM | POA: Diagnosis not present

## 2021-05-15 DIAGNOSIS — T8144XA Sepsis following a procedure, initial encounter: Secondary | ICD-10-CM | POA: Diagnosis not present

## 2021-05-15 DIAGNOSIS — W19XXXA Unspecified fall, initial encounter: Secondary | ICD-10-CM

## 2021-05-15 DIAGNOSIS — N390 Urinary tract infection, site not specified: Secondary | ICD-10-CM | POA: Diagnosis not present

## 2021-05-15 DIAGNOSIS — I2510 Atherosclerotic heart disease of native coronary artery without angina pectoris: Secondary | ICD-10-CM | POA: Insufficient documentation

## 2021-05-15 DIAGNOSIS — E785 Hyperlipidemia, unspecified: Secondary | ICD-10-CM | POA: Diagnosis not present

## 2021-05-15 DIAGNOSIS — G9389 Other specified disorders of brain: Secondary | ICD-10-CM | POA: Diagnosis not present

## 2021-05-15 DIAGNOSIS — S0083XA Contusion of other part of head, initial encounter: Secondary | ICD-10-CM | POA: Insufficient documentation

## 2021-05-15 DIAGNOSIS — N1831 Chronic kidney disease, stage 3a: Secondary | ICD-10-CM | POA: Diagnosis present

## 2021-05-15 DIAGNOSIS — Z8249 Family history of ischemic heart disease and other diseases of the circulatory system: Secondary | ICD-10-CM | POA: Diagnosis not present

## 2021-05-15 DIAGNOSIS — I6529 Occlusion and stenosis of unspecified carotid artery: Secondary | ICD-10-CM | POA: Diagnosis not present

## 2021-05-15 DIAGNOSIS — E119 Type 2 diabetes mellitus without complications: Secondary | ICD-10-CM | POA: Insufficient documentation

## 2021-05-15 DIAGNOSIS — R000 Tachycardia, unspecified: Secondary | ICD-10-CM | POA: Diagnosis not present

## 2021-05-15 DIAGNOSIS — F32A Depression, unspecified: Secondary | ICD-10-CM | POA: Diagnosis present

## 2021-05-15 DIAGNOSIS — R509 Fever, unspecified: Secondary | ICD-10-CM | POA: Diagnosis not present

## 2021-05-15 DIAGNOSIS — I482 Chronic atrial fibrillation, unspecified: Secondary | ICD-10-CM | POA: Diagnosis not present

## 2021-05-15 LAB — CBC WITH DIFFERENTIAL/PLATELET
Abs Immature Granulocytes: 0.06 10*3/uL (ref 0.00–0.07)
Basophils Absolute: 0 10*3/uL (ref 0.0–0.1)
Basophils Relative: 0 %
Eosinophils Absolute: 0 10*3/uL (ref 0.0–0.5)
Eosinophils Relative: 0 %
HCT: 33.2 % — ABNORMAL LOW (ref 39.0–52.0)
Hemoglobin: 10.7 g/dL — ABNORMAL LOW (ref 13.0–17.0)
Immature Granulocytes: 1 %
Lymphocytes Relative: 16 %
Lymphs Abs: 1.8 10*3/uL (ref 0.7–4.0)
MCH: 29.7 pg (ref 26.0–34.0)
MCHC: 32.2 g/dL (ref 30.0–36.0)
MCV: 92.2 fL (ref 80.0–100.0)
Monocytes Absolute: 1 10*3/uL (ref 0.1–1.0)
Monocytes Relative: 9 %
Neutro Abs: 8.4 10*3/uL — ABNORMAL HIGH (ref 1.7–7.7)
Neutrophils Relative %: 74 %
Platelets: 363 10*3/uL (ref 150–400)
RBC: 3.6 MIL/uL — ABNORMAL LOW (ref 4.22–5.81)
RDW: 15 % (ref 11.5–15.5)
WBC: 11.3 10*3/uL — ABNORMAL HIGH (ref 4.0–10.5)
nRBC: 0 % (ref 0.0–0.2)

## 2021-05-15 LAB — COMPREHENSIVE METABOLIC PANEL
ALT: 8 U/L (ref 0–44)
AST: 23 U/L (ref 15–41)
Albumin: 2.4 g/dL — ABNORMAL LOW (ref 3.5–5.0)
Alkaline Phosphatase: 42 U/L (ref 38–126)
Anion gap: 7 (ref 5–15)
BUN: 21 mg/dL (ref 8–23)
CO2: 25 mmol/L (ref 22–32)
Calcium: 8.3 mg/dL — ABNORMAL LOW (ref 8.9–10.3)
Chloride: 103 mmol/L (ref 98–111)
Creatinine, Ser: 1.32 mg/dL — ABNORMAL HIGH (ref 0.61–1.24)
GFR, Estimated: 56 mL/min — ABNORMAL LOW (ref >60)
Glucose, Bld: 101 mg/dL — ABNORMAL HIGH (ref 70–99)
Potassium: 3.5 mmol/L (ref 3.5–5.1)
Sodium: 135 mmol/L (ref 135–145)
Total Bilirubin: 0.3 mg/dL (ref 0.3–1.2)
Total Protein: 5.2 g/dL — ABNORMAL LOW (ref 6.5–8.1)

## 2021-05-15 LAB — GLUCOSE, CAPILLARY: Glucose-Capillary: 253 mg/dL — ABNORMAL HIGH (ref 70–99)

## 2021-05-15 LAB — SURGICAL PATHOLOGY

## 2021-05-15 MED ORDER — TRAMADOL HCL 50 MG PO TABS: 50.0000 mg | ORAL_TABLET | Freq: Four times a day (QID) | ORAL | 0 refills | Status: DC | PRN

## 2021-05-15 MED ORDER — CHLORHEXIDINE GLUCONATE CLOTH 2 % EX PADS
6.0000 | MEDICATED_PAD | Freq: Every day | CUTANEOUS | Status: DC
  Administered 2021-05-15: 6 via TOPICAL

## 2021-05-15 NOTE — Plan of Care (Signed)
  Problem: Education: Goal: Knowledge of General Education information will improve Description: Including pain rating scale, medication(s)/side effects and non-pharmacologic comfort measures Outcome: Adequate for Discharge   Problem: Health Behavior/Discharge Planning: Goal: Ability to manage health-related needs will improve Outcome: Adequate for Discharge   Problem: Clinical Measurements: Goal: Ability to maintain clinical measurements within normal limits will improve Outcome: Adequate for Discharge Goal: Will remain free from infection Outcome: Adequate for Discharge Goal: Diagnostic test results will improve Outcome: Adequate for Discharge Goal: Respiratory complications will improve Outcome: Adequate for Discharge Goal: Cardiovascular complication will be avoided Outcome: Adequate for Discharge   Problem: Activity: Goal: Risk for activity intolerance will decrease Outcome: Adequate for Discharge   Problem: Nutrition: Goal: Adequate nutrition will be maintained Outcome: Adequate for Discharge   Problem: Coping: Goal: Level of anxiety will decrease Outcome: Adequate for Discharge   Problem: Elimination: Goal: Will not experience complications related to bowel motility Outcome: Adequate for Discharge Goal: Will not experience complications related to urinary retention Outcome: Adequate for Discharge   Problem: Pain Managment: Goal: General experience of comfort will improve Outcome: Adequate for Discharge   Problem: Safety: Goal: Ability to remain free from injury will improve Outcome: Adequate for Discharge   Problem: Skin Integrity: Goal: Risk for impaired skin integrity will decrease Outcome: Adequate for Discharge   Problem: Education: Goal: Knowledge of the prescribed therapeutic regimen will improve Outcome: Adequate for Discharge   Problem: Bowel/Gastric: Goal: Gastrointestinal status for postoperative course will improve Outcome: Adequate for  Discharge   Problem: Health Behavior/Discharge Planning: Goal: Identification of resources available to assist in meeting health care needs will improve Outcome: Adequate for Discharge   Problem: Skin Integrity: Goal: Demonstration of wound healing without infection will improve Outcome: Adequate for Discharge   Problem: Urinary Elimination: Goal: Ability to avoid or minimize complications of infection will improve Outcome: Adequate for Discharge   

## 2021-05-15 NOTE — Discharge Summary (Signed)
Physician Discharge Summary  Patient ID: Patrick Miranda MRN: [id number] DOB/AGE: 09-24-1945 75 y.o.  Admit date: 05/14/2021 Discharge date: 05/15/2021  Admission Diagnoses:  Discharge Diagnoses:  Active Problems:   BPH with obstruction/lower urinary tract symptoms   Discharged Condition: good  Hospital Course: 75 year old male underwent TURP on 05/14/2021.  He remained overnight on observation with continuous bladder irrigation.  He did well and was able to be weaned off continuous bladder irrigation.  He was stable for discharge with Foley catheter the following day.  Consults: None  Significant Diagnostic Studies: None  Treatments: surgery: TURP  Discharge Exam: Blood pressure (!) 122/55, pulse 61, temperature 98.9 F (37.2 C), temperature source Oral, resp. rate 20, height 5' 10.5" (1.791 m), weight 73.7 kg, SpO2 96 %. General appearance: alert no acute distress Adequate perfusion of extremities Nonlabored respiration Abdomen soft, nontender, nondistended Three-way Foley catheter in place draining light pink urine off CBI  Disposition: Discharge disposition: 01-Home or Self Care       Discharge Instructions     Diet - low sodium heart healthy   Complete by: As directed    No wound care   Complete by: As directed       Allergies as of 05/15/2021       Reactions   Lipitor [atorvastatin] Other (See Comments)   Muscle pain   Sitagliptin    Myalgias        Medication List     TAKE these medications    acetaminophen 325 MG tablet Commonly known as: TYLENOL Take 2 tablets (650 mg total) by mouth every 6 (six) hours as needed for mild pain or headache (or Fever >/= 101).   apixaban 5 MG Tabs tablet Commonly known as: Eliquis Take 1 tablet (5 mg total) by mouth 2 (two) times daily.   aspirin 81 MG EC tablet Take 1 tablet (81 mg total) by mouth daily. Swallow whole.   B-12 5000 MCG Subl Place 5,000 mcg under the tongue daily.   BD Pen Needle  Nano U/F 32G X 4 MM Misc Generic drug: Insulin Pen Needle 1 each by Other route daily.   Biotin 5000 MCG Tabs Take 5,000 mcg by mouth daily.   budesonide 3 MG 24 hr capsule Commonly known as: ENTOCORT EC Take 9 mg by mouth daily.   CULTURELLE PO Take 1 capsule by mouth daily.   enoxaparin 120 MG/0.8ML injection Commonly known as: LOVENOX Pinch area of skin on abdomen and then inject entire contents of syringe under the skin in the morning on the day before surgery   fenofibrate 160 MG tablet Take 160 mg by mouth daily.   finasteride 5 MG tablet Commonly known as: PROSCAR Take 5 mg by mouth at bedtime.   FreeStyle [employer] 2 Sensor Misc See admin instructions.   losartan 25 MG tablet Commonly known as: COZAAR Take 0.5 tablets (12.5 mg total) by mouth daily.   memantine 10 MG tablet Commonly known as: NAMENDA TAKE 1 TABLET(10 MG) BY MOUTH TWICE DAILY   metFORMIN 500 MG tablet Commonly known as: GLUCOPHAGE Take 500 mg by mouth 2 (two) times daily with a meal.   metoprolol succinate 25 MG 24 hr tablet Commonly known as: TOPROL-XL TAKE 1 TABLET(25 MG) BY MOUTH DAILY AFTER SUPPER   multivitamin with minerals Tabs tablet Take 1 tablet by mouth daily.   NOVOLOG FLEXPEN [place] Inject 0-15 Units into the skin 3 (three) times daily before meals. Dose per sliding scale   OneTouch Verio test  strip Generic drug: glucose blood 1 each by Other route in the morning, at noon, and at bedtime.   rosuvastatin 20 MG tablet Commonly known as: CRESTOR TAKE 1 TABLET(20 MG) BY MOUTH DAILY   spironolactone 25 MG tablet Commonly known as: ALDACTONE Take 0.5 tablets (12.5 mg total) by mouth daily.   tamsulosin 0.4 MG Caps capsule Commonly known as: FLOMAX Take 1 capsule (0.4 mg total) by mouth daily. What changed: when to take this   Toujeo SoloStar 300 UNIT/ML Solostar Pen Generic drug: insulin glargine (1 Unit Dial) Inject 20 Units into the skin daily. What changed:  how much to  take additional instructions   traMADol 50 MG tablet Commonly known as: Ultram Take 1 tablet (50 mg total) by mouth every 6 (six) hours as needed.        Follow-up Information     [facility] Follow up.   Contact information: [place] [phone]                Signed: Marton Redwood, III 05/15/2021, 10:09 AM

## 2021-05-15 NOTE — ED Provider Notes (Signed)
Emergency Medicine Provider Triage Evaluation Note  [place] , a 75 y.o. male  was evaluated in triage.  Pt complains of weakness, fall.  Patient states he had a TURP procedure yesterday.  He left the hospital today at 11:00.  Wife states since he left the hospital, he has been extremely weak, unable to walk which she normally can do.  He has fallen twice today due to weakness, once while trying to get into bed and unable to lift his legs up onto the bed and he slid down.  Another time when he was trying to walk and he fell backwards.  After the second fall, patient was not having anything draining from his Foley from the recent TURP, there is blood at the tip of his penis.  As such, EMS was called.  Foley started to drain with EMS.  Patient is also reporting pain in his neck since the second fall.  He hit the right side of his face and has an abrasion, but no pain in that area.  He is on Eliquis, but this has been held for the past 2 days due to the procedure.  Review of Systems  Positive: Weakness, neck pain Negative: cp  Physical Exam  BP (!) 94/47 (BP Location: Right Arm)   Pulse 79   Temp 98.4 F (36.9 C) (Oral)   Resp 18   SpO2 99%  Gen:   Awake, no distress   Resp:  Normal effort  MSK:   Moves extremities without difficulty  Other:  No ttp of abd  Medical Decision Making  Medically screening exam initiated at 6:46 PM.  Appropriate orders placed.  [place] was informed that the remainder of the evaluation will be completed by another provider, this initial triage assessment does not replace that evaluation, and the importance of remaining in the ED until their evaluation is complete.  Labs, imaging    Franchot Heidelberg, PA-C 05/15/21 1847    Tegeler, Gwenyth Allegra, MD 05/15/21 [phone]

## 2021-05-15 NOTE — ED Triage Notes (Addendum)
Pt arrived via POV, c/o multiple falls since being discharged this afternoon. No LOC. Stopped bloodthinners prior to TURP

## 2021-05-15 NOTE — Progress Notes (Signed)
Patient and spouse given discharge, follow up, medication, and foley catheter care instructions, verbalized understanding, IV removed, personal belongings with patient, spouse to transport home

## 2021-05-16 DIAGNOSIS — N401 Enlarged prostate with lower urinary tract symptoms: Secondary | ICD-10-CM | POA: Diagnosis not present

## 2021-05-16 DIAGNOSIS — R3912 Poor urinary stream: Secondary | ICD-10-CM | POA: Diagnosis not present

## 2021-05-16 DIAGNOSIS — R350 Frequency of micturition: Secondary | ICD-10-CM | POA: Diagnosis not present

## 2021-05-16 NOTE — ED Provider Notes (Signed)
[facility] DEPT Provider Note   CSN: [id number] Arrival date & time: 05/15/21  1803     History Chief Complaint  Patient presents with   Patrick Miranda is a 75 y.o. male.  The history is provided by the patient, the spouse and medical records.  Patrick Miranda is a 75 y.o. male who presents to the Emergency Department complaining of fall. He presents the emergency department accompanied by his wife for evaluation of injuries following a fall. On October 25 he had a TURP procedure performed and was discharged home on October 26. He takes eloquence for history of atrial fibrillation. The medication was held for surgery but it was restarted Wednesday morning. He had an event when he was getting into bed from his wheelchair when he almost fell but his wife was able to catch him. He later was in bed and rolled out of bed and struck his face on a nightstand. He complains of mild pain to his upper neck, no additional symptoms. His catheter is draining well. No chest pain, difficulty breathing, abdominal pain, nausea and vomiting.     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[place])    Left ventricular mural thrombus    NSTEMI (non-ST elevated myocardial infarction) (HCC)    OSA on CPAP    Pulmonary emboli [facility])    May 2021   Skin cancer    Type 2 diabetes mellitus ([place])     Patient Active Problem List   Diagnosis Date Noted   BPH with obstruction/lower urinary tract symptoms 05/14/2021   Dementia ([place]) 82/50/5397   Umbilical hernia, incarcerated  67/34/1937   Metabolic encephalopathy 90/24/0973   Abdominal pain 08/03/2020   Chronic kidney disease, stage 3a ([place]) 08/03/2020   Mixed diabetic hyperlipidemia associated with type 2 diabetes mellitus ([place]) 08/03/2020   Uncontrolled type 2 diabetes mellitus with hyperglycemia, with long-term current use of insulin ([place]) 08/03/2020   Foodborne gastroenteritis 08/03/2020   SIRS (systemic inflammatory response syndrome) ([place]) 08/03/2020   Postop check 07/25/2020   Hx of CABG 07/09/2020   Coronary artery disease involving native heart 07/04/2020   Abnormal nuclear stress test    Atrial fibrillation ([place]) 12/14/2019   Hypersomnia 08/30/2019   History of adenomatous polyp of colon 05/06/2018   Vasomotor rhinitis 07/18/2017   Arthritis of knee, right 01/06/2014   CVA (cerebral vascular accident) ([place]) 09/07/2011    Class: Acute   Dysarthria 09/05/2011    Class: Acute   Essential hypertension 09/05/2011    Class: Chronic   OSA on CPAP 09/05/2011    Class: Chronic   Anemia 09/05/2011    Class: Chronic    Past Surgical History:  Procedure Laterality Date   COLON SURGERY     COLONOSCOPY W/ POLYPECTOMY     CORONARY ARTERY BYPASS GRAFT N/A 07/09/2020   Procedure: CORONARY ARTERY BYPASS GRAFTING (CABG) TIMES THREE , USING LEFT INTERNAL MAMMARY ARTERY AND RIGHT LEG GREATER SAPHENOUS  VEIN HARVESTED ENDOSCOPICALLY;  Surgeon: Ivin Poot, MD;  Location: [facility];  Service: Open Heart Surgery;  Laterality: N/A;   LEFT HEART CATH AND CORONARY ANGIOGRAPHY N/A 07/04/2020   Procedure: LEFT HEART CATH AND CORONARY ANGIOGRAPHY;  Surgeon: Troy Sine, MD;  Location: [place] CV LAB;  Service: Cardiovascular;  Laterality: N/A;   TEE WITHOUT CARDIOVERSION N/A 07/09/2020   Procedure: TRANSESOPHAGEAL ECHOCARDIOGRAM (TEE);  Surgeon: Prescott Gum, Collier Salina, MD;  Location: [place];  Service: Open Heart Surgery;  Laterality: N/A;   TOTAL KNEE ARTHROPLASTY Left 04/22/2013   Procedure: TOTAL KNEE ARTHROPLASTY-  left;  Surgeon: Augustin Schooling, MD;  Location: [place];  Service: Orthopedics;  Laterality: Left;  with femoral block   TOTAL KNEE ARTHROPLASTY Right 01/06/2014   Procedure: RIGHT TOTAL KNEE ARTHROPLASTY;  Surgeon: Augustin Schooling, MD;  Location: [facility];  Service: Orthopedics;  Laterality: Right;   TRANSURETHRAL RESECTION OF PROSTATE N/A 05/14/2021   Procedure: TRANSURETHRAL RESECTION OF THE PROSTATE (TURP);  Surgeon: Robley Fries, MD;  Location: [facility];  Service: Urology;  Laterality: N/A;  58 MINS       Family History  Problem Relation Age of Onset   CAD Brother        Premature   CAD Sister     Social History   Tobacco Use   Smoking status: Never   Smokeless tobacco: Never  Vaping Use   Vaping Use: Never used  Substance Use Topics   Alcohol use: No   Drug use: No    Home Medications Prior to Admission medications   Medication Sig Start Date End Date Taking? Authorizing Provider  acetaminophen (TYLENOL) 325 MG tablet Take 2 tablets (650 mg total) by mouth every 6 (six) hours as needed for mild pain or headache (or Fever >/= 101). 08/10/20   Domenic Polite, MD  apixaban (ELIQUIS) 5 MG TABS tablet Take 1 tablet (5 mg total) by mouth 2 (two) times daily. 04/16/21   Satira Sark, MD  aspirin EC 81 MG EC tablet Take 1 tablet (81 mg total) by mouth daily. Swallow whole. 09/12/20   Bonnielee Haff, MD  BD PEN NEEDLE NANO U/F 32G X 4 MM MISC 1 each by Other route daily.  03/19/18   [provider]  Biotin 5000 MCG TABS Take 5,000 mcg by mouth daily.    [provider]  budesonide (ENTOCORT EC) 3 MG 24 hr capsule Take 9 mg by mouth daily.    [provider]  Continuous Blood Gluc Sensor (FREESTYLE LIBRE 2 SENSOR) MISC See admin instructions. 10/31/20   [provider]  Cyanocobalamin (B-12) 5000 MCG SUBL Place 5,000 mcg under the tongue daily.    [provider]  enoxaparin (LOVENOX) 120 MG/0.8ML injection Pinch area of skin on abdomen  and then inject entire contents of syringe under the skin in the morning on the day before surgery 05/09/21   Satira Sark, MD  fenofibrate 160 MG tablet Take 160 mg by mouth daily.    [provider]  finasteride (PROSCAR) 5 MG tablet Take 5 mg by mouth at bedtime. 08/17/20   [provider]  Insulin Aspart (NOVOLOG FLEXPEN [place]) Inject 0-15 Units into the skin 3 (three) times daily before meals. Dose per sliding scale    [provider]  Lactobacillus Rhamnosus, GG, (CULTURELLE PO) Take 1 capsule by mouth daily.    [provider]  losartan (COZAAR) 25 MG tablet Take 0.5 tablets (12.5 mg total)  by mouth daily. 03/28/21   Satira Sark, MD  memantine (NAMENDA) 10 MG tablet TAKE 1 TABLET(10 MG) BY MOUTH TWICE DAILY 04/08/21   Garvin Fila, MD  metFORMIN (GLUCOPHAGE) 500 MG tablet Take 500 mg by mouth 2 (two) times daily with a meal. 10/10/20   [provider]  metoprolol succinate (TOPROL-XL) 25 MG 24 hr tablet TAKE 1 TABLET(25 MG) BY MOUTH DAILY AFTER SUPPER 04/30/21   Strader, [place] M, PA-C  Multiple Vitamin (MULTIVITAMIN WITH MINERALS) TABS tablet Take 1 tablet by mouth daily.    [provider]  [facility] VERIO test strip 1 each by Other route in the morning, at noon, and at bedtime.  03/20/18   [provider]  rosuvastatin (CRESTOR) 20 MG tablet TAKE 1 TABLET(20 MG) BY MOUTH DAILY 02/25/21   Satira Sark, MD  spironolactone (ALDACTONE) 25 MG tablet Take 0.5 tablets (12.5 mg total) by mouth daily. 12/26/20   Satira Sark, MD  tamsulosin (FLOMAX) 0.4 MG CAPS capsule Take 1 capsule (0.4 mg total) by mouth daily. Patient taking differently: Take 0.4 mg by mouth at bedtime. 08/11/20   Domenic Polite, MD  TOUJEO SOLOSTAR 300 UNIT/ML Solostar Pen Inject 20 Units into the skin daily. Patient taking differently: Inject 15 Units into the skin daily. 7 units this am 08/10/20   Domenic Polite, MD  traMADol (ULTRAM) 50 MG tablet  Take 1 tablet (50 mg total) by mouth every 6 (six) hours as needed. 05/15/21 05/15/22  Lucas Mallow, MD    Allergies    Lipitor [atorvastatin] and Sitagliptin  Review of Systems   Review of Systems  All other systems reviewed and are negative.  Physical Exam Updated Vital Signs BP (!) 124/55   Pulse 67   Temp 98.4 F (36.9 C) (Oral)   Resp 18   SpO2 100%   Physical Exam Vitals and nursing note reviewed.  Constitutional:      Appearance: He is well-developed.  HENT:     Head: Normocephalic.     Comments: Ecchymosis to the right lateral mandible without significant local tenderness or swelling. Able to open and close jaw without difficulty Neck:     Comments: Mild tenderness to palpation over the upper neck posteriorly Cardiovascular:     Rate and Rhythm: Normal rate and regular rhythm.     Heart sounds: No murmur heard. Pulmonary:     Effort: Pulmonary effort is normal. No respiratory distress.     Breath sounds: Normal breath sounds.  Abdominal:     Palpations: Abdomen is soft.     Tenderness: There is no abdominal tenderness. There is no guarding or rebound.  Genitourinary:    Comments: Foley catheter bag hooked to the side of the wheelchair with pale yellow urine in tubing.  Musculoskeletal:        General: No tenderness.     Cervical back: Neck supple.     Comments: Five out of five grip strength and bilateral upper extremities. Five out of five strength and bilateral lower extremities  Skin:    General: Skin is warm and dry.  Neurological:     Mental Status: He is alert and oriented to person, place, and time.  Psychiatric:        Behavior: Behavior normal.    ED Results / Procedures / Treatments   Labs (all labs ordered are listed, but only abnormal results are displayed) Labs Reviewed  CBC WITH DIFFERENTIAL/PLATELET - Abnormal; Notable for the following components:  Result Value   WBC 11.3 (*)    RBC 3.60 (*)    Hemoglobin 10.7 (*)    HCT  33.2 (*)    Neutro Abs 8.4 (*)    All other components within normal limits  COMPREHENSIVE METABOLIC PANEL - Abnormal; Notable for the following components:   Glucose, Bld 101 (*)    Creatinine, Ser 1.32 (*)    Calcium 8.3 (*)    Total Protein 5.2 (*)    Albumin 2.4 (*)    GFR, Estimated 56 (*)    All other components within normal limits    EKG None  Radiology CT Head Wo Contrast  Result Date: 05/15/2021 CLINICAL DATA:  Weakness unable to walk EXAM: CT HEAD WITHOUT CONTRAST CT CERVICAL SPINE WITHOUT CONTRAST TECHNIQUE: Multidetector CT imaging of the head and cervical spine was performed following the standard protocol without intravenous contrast. Multiplanar CT image reconstructions of the cervical spine were also generated. COMPARISON:  CT brain and cervical spine 02/17/2021 FINDINGS: CT HEAD FINDINGS Brain: No acute territorial infarction, hemorrhage or intracranial mass. Multiple chronic infarcts with encephalomalacia in the left frontal lobe, right parietal lobe, right occipital lobe and right posterior temporal lobes. Chronic lacunar infarcts within the pons and left thalamus. Mild atrophy. Moderate chronic small vessel ischemic changes of the white matter. Vascular: No hyperdense vessels.  Carotid vascular calcification Skull: Normal. Negative for fracture or focal lesion. Sinuses/Orbits: No acute finding. Other: None CT CERVICAL SPINE FINDINGS Alignment: No subluxation.  Facet alignment within normal limits. Skull base and vertebrae: No acute fracture. No primary bone lesion or focal pathologic process. Soft tissues and spinal canal: No prevertebral fluid or swelling. No visible canal hematoma. Disc levels: Multilevel degenerative change. Moderate severe disc space narrowing C5-C6 and C6-C7. Facet degenerative change at multiple levels with foraminal stenosis Upper chest: Lung apices are clear. 1.3 cm hypodense nodule left lobe of thyroid, no further workup recommended based on size of  lesion and age of patient Other: Negative IMPRESSION: 1. No CT evidence for acute intracranial abnormality. Atrophy and chronic small vessel ischemic changes of the white matter. Multiple chronic infarcts. 2. Degenerative changes of the cervical spine. No acute osseous abnormality. Electronically Signed   By: Donavan Foil M.D.   On: 05/15/2021 19:41   CT Cervical Spine Wo Contrast  Result Date: 05/15/2021 CLINICAL DATA:  Weakness unable to walk EXAM: CT HEAD WITHOUT CONTRAST CT CERVICAL SPINE WITHOUT CONTRAST TECHNIQUE: Multidetector CT imaging of the head and cervical spine was performed following the standard protocol without intravenous contrast. Multiplanar CT image reconstructions of the cervical spine were also generated. COMPARISON:  CT brain and cervical spine 02/17/2021 FINDINGS: CT HEAD FINDINGS Brain: No acute territorial infarction, hemorrhage or intracranial mass. Multiple chronic infarcts with encephalomalacia in the left frontal lobe, right parietal lobe, right occipital lobe and right posterior temporal lobes. Chronic lacunar infarcts within the pons and left thalamus. Mild atrophy. Moderate chronic small vessel ischemic changes of the white matter. Vascular: No hyperdense vessels.  Carotid vascular calcification Skull: Normal. Negative for fracture or focal lesion. Sinuses/Orbits: No acute finding. Other: None CT CERVICAL SPINE FINDINGS Alignment: No subluxation.  Facet alignment within normal limits. Skull base and vertebrae: No acute fracture. No primary bone lesion or focal pathologic process. Soft tissues and spinal canal: No prevertebral fluid or swelling. No visible canal hematoma. Disc levels: Multilevel degenerative change. Moderate severe disc space narrowing C5-C6 and C6-C7. Facet degenerative change at multiple levels with foraminal stenosis Upper chest: Lung  apices are clear. 1.3 cm hypodense nodule left lobe of thyroid, no further workup recommended based on size of lesion and age  of patient Other: Negative IMPRESSION: 1. No CT evidence for acute intracranial abnormality. Atrophy and chronic small vessel ischemic changes of the white matter. Multiple chronic infarcts. 2. Degenerative changes of the cervical spine. No acute osseous abnormality. Electronically Signed   By: Donavan Foil M.D.   On: 05/15/2021 19:41    Procedures Procedures   Medications Ordered in ED Medications - No data to display  ED Course  I have reviewed the triage vital signs and the nursing notes.  Pertinent labs & imaging results that were available during my care of the patient were reviewed by me and considered in my medical decision making (see chart for details).    MDM Rules/Calculators/A&P                          patient with extensive cardiac and neurologic history on anticoagulation here for evaluation of injuries following a fall out of bed. He has bruising to the right jaw without significant tenderness. No clinical evidence of mandibular fracture. Imaging of head and neck are negative for acute fracture or serious intracranial bleed. He did have one borderline blood pressure during his ED stay. This improved without intervention. On record review patient has normal blood pressures at baseline with occasional hypotension. He is asymptomatic at this time. CBC with mild leukocytosis but no evidence for acute infection at this time. Plan to discharge home with ongoing home care, outpatient follow-up and return precautions  Final Clinical Impression(s) / ED Diagnoses Final diagnoses:  Fall, initial encounter  Contusion of face, initial encounter    Rx / DC Orders ED Discharge Orders     None        Quintella Reichert, MD 05/16/21 0236

## 2021-05-18 ENCOUNTER — Encounter (HOSPITAL_COMMUNITY): Payer: Self-pay | Admitting: Emergency Medicine

## 2021-05-18 ENCOUNTER — Emergency Department (HOSPITAL_COMMUNITY): Payer: Medicare HMO

## 2021-05-18 ENCOUNTER — Inpatient Hospital Stay (HOSPITAL_COMMUNITY)
Admission: EM | Admit: 2021-05-18 | Discharge: 2021-05-21 | DRG: 862 | Disposition: A | Discharge status: Home Health | Payer: Medicare HMO | Attending: Internal Medicine | Admitting: Internal Medicine

## 2021-05-18 DIAGNOSIS — A4159 Other Gram-negative sepsis: Secondary | ICD-10-CM | POA: Diagnosis present

## 2021-05-18 DIAGNOSIS — Z9181 History of falling: Secondary | ICD-10-CM

## 2021-05-18 DIAGNOSIS — N390 Urinary tract infection, site not specified: Secondary | ICD-10-CM | POA: Diagnosis present

## 2021-05-18 DIAGNOSIS — T8144XA Sepsis following a procedure, initial encounter: Principal | ICD-10-CM | POA: Diagnosis present

## 2021-05-18 DIAGNOSIS — Z7901 Long term (current) use of anticoagulants: Secondary | ICD-10-CM

## 2021-05-18 DIAGNOSIS — R509 Fever, unspecified: Secondary | ICD-10-CM | POA: Diagnosis not present

## 2021-05-18 DIAGNOSIS — Z9079 Acquired absence of other genital organ(s): Secondary | ICD-10-CM

## 2021-05-18 DIAGNOSIS — Z96651 Presence of right artificial knee joint: Secondary | ICD-10-CM | POA: Diagnosis present

## 2021-05-18 DIAGNOSIS — E1165 Type 2 diabetes mellitus with hyperglycemia: Secondary | ICD-10-CM | POA: Diagnosis present

## 2021-05-18 DIAGNOSIS — Z1611 Resistance to penicillins: Secondary | ICD-10-CM | POA: Diagnosis present

## 2021-05-18 DIAGNOSIS — Z794 Long term (current) use of insulin: Secondary | ICD-10-CM

## 2021-05-18 DIAGNOSIS — G4733 Obstructive sleep apnea (adult) (pediatric): Secondary | ICD-10-CM | POA: Diagnosis present

## 2021-05-18 DIAGNOSIS — I5022 Chronic systolic (congestive) heart failure: Secondary | ICD-10-CM | POA: Diagnosis present

## 2021-05-18 DIAGNOSIS — F0390 Unspecified dementia without behavioral disturbance: Secondary | ICD-10-CM | POA: Diagnosis present

## 2021-05-18 DIAGNOSIS — E1129 Type 2 diabetes mellitus with other diabetic kidney complication: Secondary | ICD-10-CM | POA: Diagnosis not present

## 2021-05-18 DIAGNOSIS — I252 Old myocardial infarction: Secondary | ICD-10-CM

## 2021-05-18 DIAGNOSIS — Z85828 Personal history of other malignant neoplasm of skin: Secondary | ICD-10-CM

## 2021-05-18 DIAGNOSIS — Z8616 Personal history of COVID-19: Secondary | ICD-10-CM

## 2021-05-18 DIAGNOSIS — S0083XA Contusion of other part of head, initial encounter: Secondary | ICD-10-CM | POA: Diagnosis present

## 2021-05-18 DIAGNOSIS — E785 Hyperlipidemia, unspecified: Secondary | ICD-10-CM | POA: Diagnosis not present

## 2021-05-18 DIAGNOSIS — R404 Transient alteration of awareness: Secondary | ICD-10-CM | POA: Diagnosis not present

## 2021-05-18 DIAGNOSIS — B961 Klebsiella pneumoniae [K. pneumoniae] as the cause of diseases classified elsewhere: Secondary | ICD-10-CM | POA: Diagnosis present

## 2021-05-18 DIAGNOSIS — I255 Ischemic cardiomyopathy: Secondary | ICD-10-CM | POA: Diagnosis present

## 2021-05-18 DIAGNOSIS — A419 Sepsis, unspecified organism: Secondary | ICD-10-CM | POA: Diagnosis not present

## 2021-05-18 DIAGNOSIS — I482 Chronic atrial fibrillation, unspecified: Secondary | ICD-10-CM | POA: Diagnosis present

## 2021-05-18 DIAGNOSIS — Z8673 Personal history of transient ischemic attack (TIA), and cerebral infarction without residual deficits: Secondary | ICD-10-CM

## 2021-05-18 DIAGNOSIS — Z86711 Personal history of pulmonary embolism: Secondary | ICD-10-CM

## 2021-05-18 DIAGNOSIS — D631 Anemia in chronic kidney disease: Secondary | ICD-10-CM | POA: Diagnosis present

## 2021-05-18 DIAGNOSIS — R6520 Severe sepsis without septic shock: Secondary | ICD-10-CM | POA: Diagnosis present

## 2021-05-18 DIAGNOSIS — Z7984 Long term (current) use of oral hypoglycemic drugs: Secondary | ICD-10-CM

## 2021-05-18 DIAGNOSIS — I10 Essential (primary) hypertension: Secondary | ICD-10-CM

## 2021-05-18 DIAGNOSIS — I4891 Unspecified atrial fibrillation: Secondary | ICD-10-CM | POA: Diagnosis not present

## 2021-05-18 DIAGNOSIS — I130 Hypertensive heart and chronic kidney disease with heart failure and stage 1 through stage 4 chronic kidney disease, or unspecified chronic kidney disease: Secondary | ICD-10-CM | POA: Diagnosis present

## 2021-05-18 DIAGNOSIS — Z7982 Long term (current) use of aspirin: Secondary | ICD-10-CM

## 2021-05-18 DIAGNOSIS — Z20822 Contact with and (suspected) exposure to covid-19: Secondary | ICD-10-CM | POA: Diagnosis present

## 2021-05-18 DIAGNOSIS — R410 Disorientation, unspecified: Secondary | ICD-10-CM | POA: Diagnosis not present

## 2021-05-18 DIAGNOSIS — N1831 Chronic kidney disease, stage 3a: Secondary | ICD-10-CM | POA: Diagnosis present

## 2021-05-18 DIAGNOSIS — W06XXXA Fall from bed, initial encounter: Secondary | ICD-10-CM | POA: Diagnosis present

## 2021-05-18 DIAGNOSIS — Z79899 Other long term (current) drug therapy: Secondary | ICD-10-CM

## 2021-05-18 DIAGNOSIS — M1990 Unspecified osteoarthritis, unspecified site: Secondary | ICD-10-CM | POA: Diagnosis present

## 2021-05-18 DIAGNOSIS — F32A Depression, unspecified: Secondary | ICD-10-CM | POA: Diagnosis present

## 2021-05-18 DIAGNOSIS — Z888 Allergy status to other drugs, medicaments and biological substances status: Secondary | ICD-10-CM

## 2021-05-18 DIAGNOSIS — I2510 Atherosclerotic heart disease of native coronary artery without angina pectoris: Secondary | ICD-10-CM | POA: Diagnosis present

## 2021-05-18 DIAGNOSIS — Z8249 Family history of ischemic heart disease and other diseases of the circulatory system: Secondary | ICD-10-CM

## 2021-05-18 DIAGNOSIS — Z951 Presence of aortocoronary bypass graft: Secondary | ICD-10-CM

## 2021-05-18 DIAGNOSIS — Z87448 Personal history of other diseases of urinary system: Secondary | ICD-10-CM

## 2021-05-18 DIAGNOSIS — R000 Tachycardia, unspecified: Secondary | ICD-10-CM | POA: Diagnosis not present

## 2021-05-18 DIAGNOSIS — R739 Hyperglycemia, unspecified: Secondary | ICD-10-CM | POA: Diagnosis not present

## 2021-05-18 LAB — CBG MONITORING, ED
Glucose-Capillary: 241 mg/dL — ABNORMAL HIGH (ref 70–99)
Glucose-Capillary: 294 mg/dL — ABNORMAL HIGH (ref 70–99)
Glucose-Capillary: 339 mg/dL — ABNORMAL HIGH (ref 70–99)

## 2021-05-18 LAB — URINALYSIS, ROUTINE W REFLEX MICROSCOPIC
Bilirubin Urine: NEGATIVE
Glucose, UA: >=500 mg/dL — AB
Ketones, ur: 5 mg/dL — AB
Nitrite: POSITIVE — AB
Protein, ur: 30 mg/dL — AB
RBC / HPF: >50 RBC/hpf — ABNORMAL HIGH (ref 0–5)
Specific Gravity, Urine: 1.021 (ref 1.005–1.030)
WBC, UA: >50 WBC/hpf — ABNORMAL HIGH (ref 0–5)
pH: 5 (ref 5.0–8.0)

## 2021-05-18 LAB — CORTISOL: Cortisol, Plasma: 21.6 ug/dL

## 2021-05-18 LAB — COMPREHENSIVE METABOLIC PANEL
ALT: 10 U/L (ref 0–44)
AST: 33 U/L (ref 15–41)
Albumin: 2.3 g/dL — ABNORMAL LOW (ref 3.5–5.0)
Alkaline Phosphatase: 88 U/L (ref 38–126)
Anion gap: 9 (ref 5–15)
BUN: 19 mg/dL (ref 8–23)
CO2: 20 mmol/L — ABNORMAL LOW (ref 22–32)
Calcium: 8 mg/dL — ABNORMAL LOW (ref 8.9–10.3)
Chloride: 107 mmol/L (ref 98–111)
Creatinine, Ser: 1.43 mg/dL — ABNORMAL HIGH (ref 0.61–1.24)
GFR, Estimated: 51 mL/min — ABNORMAL LOW (ref >60)
Glucose, Bld: 273 mg/dL — ABNORMAL HIGH (ref 70–99)
Potassium: 3.6 mmol/L (ref 3.5–5.1)
Sodium: 136 mmol/L (ref 135–145)
Total Bilirubin: 0.4 mg/dL (ref 0.3–1.2)
Total Protein: 5.1 g/dL — ABNORMAL LOW (ref 6.5–8.1)

## 2021-05-18 LAB — CBC WITH DIFFERENTIAL/PLATELET
Abs Immature Granulocytes: 0.04 10*3/uL (ref 0.00–0.07)
Basophils Absolute: 0 10*3/uL (ref 0.0–0.1)
Basophils Relative: 0 %
Eosinophils Absolute: 0 10*3/uL (ref 0.0–0.5)
Eosinophils Relative: 0 %
HCT: 30.7 % — ABNORMAL LOW (ref 39.0–52.0)
Hemoglobin: 9.9 g/dL — ABNORMAL LOW (ref 13.0–17.0)
Immature Granulocytes: 1 %
Lymphocytes Relative: 8 %
Lymphs Abs: 0.6 10*3/uL — ABNORMAL LOW (ref 0.7–4.0)
MCH: 30.1 pg (ref 26.0–34.0)
MCHC: 32.2 g/dL (ref 30.0–36.0)
MCV: 93.3 fL (ref 80.0–100.0)
Monocytes Absolute: 0.1 10*3/uL (ref 0.1–1.0)
Monocytes Relative: 1 %
Neutro Abs: 6.3 10*3/uL (ref 1.7–7.7)
Neutrophils Relative %: 90 %
Platelets: 306 10*3/uL (ref 150–400)
RBC: 3.29 MIL/uL — ABNORMAL LOW (ref 4.22–5.81)
RDW: 15 % (ref 11.5–15.5)
WBC: 7 10*3/uL (ref 4.0–10.5)
nRBC: 0 % (ref 0.0–0.2)

## 2021-05-18 LAB — RESP PANEL BY RT-PCR (FLU A&B, COVID) ARPGX2
Influenza A by PCR: NEGATIVE
Influenza B by PCR: NEGATIVE
SARS Coronavirus 2 by RT PCR: NEGATIVE

## 2021-05-18 LAB — PROTIME-INR
INR: 1.3 — ABNORMAL HIGH (ref 0.8–1.2)
Prothrombin Time: 16.6 seconds — ABNORMAL HIGH (ref 11.4–15.2)

## 2021-05-18 LAB — LACTIC ACID, PLASMA
Lactic Acid, Venous: 3.6 mmol/L (ref 0.5–1.9)
Lactic Acid, Venous: 1.7 mmol/L (ref 0.5–1.9)

## 2021-05-18 LAB — GLUCOSE, CAPILLARY: Glucose-Capillary: 288 mg/dL — ABNORMAL HIGH (ref 70–99)

## 2021-05-18 MED ORDER — ROSUVASTATIN CALCIUM 20 MG PO TABS
20.0000 mg | ORAL_TABLET | Freq: Every day | ORAL | Status: DC
  Administered 2021-05-18 – 2021-05-21 (×4): 20 mg via ORAL
  Filled 2021-05-18 (×4): qty 1

## 2021-05-18 MED ORDER — BUDESONIDE 3 MG PO CPEP
9.0000 mg | ORAL_CAPSULE | Freq: Every day | ORAL | Status: DC
  Administered 2021-05-18: 9 mg via ORAL
  Filled 2021-05-18 (×3): qty 3

## 2021-05-18 MED ORDER — LACTATED RINGERS IV BOLUS
1000.0000 mL | Freq: Once | INTRAVENOUS | Status: AC
  Administered 2021-05-18: 1000 mL via INTRAVENOUS

## 2021-05-18 MED ORDER — CHLORHEXIDINE GLUCONATE CLOTH 2 % EX PADS
6.0000 | MEDICATED_PAD | Freq: Every day | CUTANEOUS | Status: DC
  Administered 2021-05-19 – 2021-05-21 (×3): 6 via TOPICAL

## 2021-05-18 MED ORDER — INSULIN GLARGINE-YFGN 100 UNIT/ML ~~LOC~~ SOLN
12.0000 [IU] | Freq: Every day | SUBCUTANEOUS | Status: DC
  Administered 2021-05-18: 12 [IU] via SUBCUTANEOUS
  Filled 2021-05-18 (×2): qty 0.12

## 2021-05-18 MED ORDER — VANCOMYCIN HCL 1500 MG/300ML IV SOLN
1500.0000 mg | Freq: Once | INTRAVENOUS | Status: AC
  Administered 2021-05-18: 1500 mg via INTRAVENOUS
  Filled 2021-05-18: qty 300

## 2021-05-18 MED ORDER — MEMANTINE HCL 10 MG PO TABS
10.0000 mg | ORAL_TABLET | Freq: Two times a day (BID) | ORAL | Status: DC
  Administered 2021-05-18 – 2021-05-21 (×7): 10 mg via ORAL
  Filled 2021-05-18 (×7): qty 1

## 2021-05-18 MED ORDER — ACETAMINOPHEN 325 MG PO TABS
650.0000 mg | ORAL_TABLET | Freq: Four times a day (QID) | ORAL | Status: DC | PRN
  Administered 2021-05-18 (×2): 650 mg via ORAL
  Filled 2021-05-18 (×2): qty 2

## 2021-05-18 MED ORDER — ONDANSETRON HCL 4 MG/2ML IJ SOLN: 4.0000 mg | Freq: Four times a day (QID) | INTRAMUSCULAR | Status: DC | PRN

## 2021-05-18 MED ORDER — METOPROLOL SUCCINATE ER 25 MG PO TB24: 12.5000 mg | ORAL_TABLET | Freq: Every day | ORAL | Status: DC

## 2021-05-18 MED ORDER — HYDROCORTISONE SOD SUC (PF) 100 MG IJ SOLR
100.0000 mg | Freq: Three times a day (TID) | INTRAMUSCULAR | Status: DC
  Administered 2021-05-18 – 2021-05-19 (×4): 100 mg via INTRAVENOUS
  Filled 2021-05-18 (×4): qty 2

## 2021-05-18 MED ORDER — TRAMADOL HCL 50 MG PO TABS: 50.0000 mg | ORAL_TABLET | Freq: Four times a day (QID) | ORAL | Status: DC | PRN

## 2021-05-18 MED ORDER — SODIUM CHLORIDE 0.9 % IV SOLN
INTRAVENOUS | Status: AC
Start: 2021-05-18 — End: 2021-05-19

## 2021-05-18 MED ORDER — APIXABAN 5 MG PO TABS
5.0000 mg | ORAL_TABLET | Freq: Two times a day (BID) | ORAL | Status: DC
  Administered 2021-05-18 – 2021-05-21 (×7): 5 mg via ORAL
  Filled 2021-05-18 (×7): qty 1

## 2021-05-18 MED ORDER — ASPIRIN EC 81 MG PO TBEC
81.0000 mg | DELAYED_RELEASE_TABLET | Freq: Every day | ORAL | Status: DC
  Administered 2021-05-18 – 2021-05-21 (×4): 81 mg via ORAL
  Filled 2021-05-18 (×4): qty 1

## 2021-05-18 MED ORDER — PIPERACILLIN-TAZOBACTAM 3.375 G IVPB 30 MIN
3.3750 g | Freq: Once | INTRAVENOUS | Status: AC
  Administered 2021-05-18: 3.375 g via INTRAVENOUS
  Filled 2021-05-18: qty 50

## 2021-05-18 MED ORDER — INSULIN ASPART 100 UNIT/ML IJ SOLN
0.0000 [IU] | Freq: Three times a day (TID) | INTRAMUSCULAR | Status: DC
  Administered 2021-05-18: 5 [IU] via SUBCUTANEOUS
  Administered 2021-05-18: 7 [IU] via SUBCUTANEOUS
  Administered 2021-05-19: 9 [IU] via SUBCUTANEOUS
  Administered 2021-05-19: 5 [IU] via SUBCUTANEOUS
  Administered 2021-05-19: 9 [IU] via SUBCUTANEOUS
  Administered 2021-05-20: 7 [IU] via SUBCUTANEOUS
  Administered 2021-05-20: 3 [IU] via SUBCUTANEOUS
  Administered 2021-05-20: 7 [IU] via SUBCUTANEOUS
  Administered 2021-05-21: 5 [IU] via SUBCUTANEOUS
  Administered 2021-05-21: 2 [IU] via SUBCUTANEOUS
  Filled 2021-05-18 (×2): qty 1

## 2021-05-18 MED ORDER — SACCHAROMYCES BOULARDII 250 MG PO CAPS
250.0000 mg | ORAL_CAPSULE | Freq: Two times a day (BID) | ORAL | Status: DC
  Administered 2021-05-18 – 2021-05-21 (×7): 250 mg via ORAL
  Filled 2021-05-18 (×7): qty 1

## 2021-05-18 MED ORDER — ONDANSETRON HCL 4 MG PO TABS: 4.0000 mg | ORAL_TABLET | Freq: Four times a day (QID) | ORAL | Status: DC | PRN

## 2021-05-18 MED ORDER — INSULIN ASPART 100 UNIT/ML IJ SOLN
0.0000 [IU] | Freq: Every day | INTRAMUSCULAR | Status: DC
  Administered 2021-05-18: 2 [IU] via SUBCUTANEOUS
  Administered 2021-05-19 – 2021-05-20 (×2): 4 [IU] via SUBCUTANEOUS

## 2021-05-18 MED ORDER — SODIUM CHLORIDE 0.9 % IV SOLN
2.0000 g | Freq: Two times a day (BID) | INTRAVENOUS | Status: DC
  Administered 2021-05-18 – 2021-05-21 (×7): 2 g via INTRAVENOUS
  Filled 2021-05-18 (×7): qty 2

## 2021-05-18 MED ORDER — FINASTERIDE 5 MG PO TABS
5.0000 mg | ORAL_TABLET | Freq: Every day | ORAL | Status: DC
  Administered 2021-05-18 – 2021-05-20 (×3): 5 mg via ORAL
  Filled 2021-05-18 (×3): qty 1

## 2021-05-18 MED ORDER — TAMSULOSIN HCL 0.4 MG PO CAPS
0.4000 mg | ORAL_CAPSULE | Freq: Every day | ORAL | Status: DC
  Administered 2021-05-18 – 2021-05-20 (×3): 0.4 mg via ORAL
  Filled 2021-05-18 (×3): qty 1

## 2021-05-18 NOTE — ED Notes (Signed)
Checked primofit and changed out for a new urine canister

## 2021-05-18 NOTE — ED Provider Notes (Addendum)
[facility] EMERGENCY DEPARTMENT Provider Note   CSN: [id number] Arrival date & time: 05/18/21  1740     History Chief Complaint  Patient presents with   Fall   Fever    Patrick Miranda is a 75 y.o. male.  75 year old male the presents emerged from today secondary to weakness and falls.  Wife is with him.  States that he is fallen 3 times since having a TURP procedure done a few days ago.  Has generalized weakness.  Difficulty getting up on his own.  Febrile with EMS so brought here for further evaluation.  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[place])    Left ventricular mural thrombus    NSTEMI (non-ST elevated myocardial infarction) ([place])    OSA on CPAP    Pulmonary emboli [facility])    May 2021   Skin cancer    Type 2 diabetes mellitus [facility])     Patient Active Problem List   Diagnosis Date Noted   Sepsis ([place]) 05/18/2021   BPH with obstruction/lower urinary tract symptoms 05/14/2021   Dementia ([place]) 81/44/8185   Umbilical hernia, incarcerated 63/14/9702   Metabolic encephalopathy 63/78/5885   Abdominal pain 08/03/2020   Chronic kidney disease, stage 3a ([place]) 08/03/2020   Mixed diabetic hyperlipidemia associated with type 2 diabetes mellitus ([place]) 08/03/2020   Uncontrolled type 2 diabetes mellitus with hyperglycemia, with long-term current use of insulin ([place]) 08/03/2020   Foodborne gastroenteritis 08/03/2020   SIRS (systemic inflammatory  response syndrome) ([place]) 08/03/2020   Postop check 07/25/2020   Hx of CABG 07/09/2020   Coronary artery disease involving native heart 07/04/2020   Abnormal nuclear stress test    Atrial fibrillation ([place]) 12/14/2019   Hypersomnia 08/30/2019   History of adenomatous polyp of colon 05/06/2018   Vasomotor rhinitis 07/18/2017   Arthritis of knee, right 01/06/2014   CVA (cerebral vascular accident) ([place]) 09/07/2011    Class: Acute   Dysarthria 09/05/2011    Class: Acute   Essential hypertension 09/05/2011    Class: Chronic   OSA on CPAP 09/05/2011    Class: Chronic   Anemia 09/05/2011    Class: Chronic    Past Surgical History:  Procedure Laterality Date   COLON SURGERY     COLONOSCOPY W/ POLYPECTOMY     CORONARY ARTERY BYPASS GRAFT N/A 07/09/2020   Procedure: CORONARY ARTERY BYPASS GRAFTING (CABG) TIMES THREE , USING LEFT INTERNAL MAMMARY ARTERY AND RIGHT LEG GREATER SAPHENOUS VEIN HARVESTED ENDOSCOPICALLY;  Surgeon: Ivin Poot, MD;  Location: [place];  Service: Open Heart Surgery;  Laterality: N/A;   LEFT HEART CATH AND CORONARY ANGIOGRAPHY N/A 07/04/2020   Procedure: LEFT HEART CATH AND CORONARY ANGIOGRAPHY;  Surgeon: Troy Sine, MD;  Location: [place] CV [facility];  Service: Cardiovascular;  Laterality: N/A;   TEE WITHOUT  CARDIOVERSION N/A 07/09/2020   Procedure: TRANSESOPHAGEAL ECHOCARDIOGRAM (TEE);  Surgeon: Prescott Gum, Collier Salina, MD;  Location: [place];  Service: Open Heart Surgery;  Laterality: N/A;   TOTAL KNEE ARTHROPLASTY Left 04/22/2013   Procedure: TOTAL KNEE ARTHROPLASTY- left;  Surgeon: Augustin Schooling, MD;  Location: [place];  Service: Orthopedics;  Laterality: Left;  with femoral block   TOTAL KNEE ARTHROPLASTY Right 01/06/2014   Procedure: RIGHT TOTAL KNEE ARTHROPLASTY;  Surgeon: Augustin Schooling, MD;  Location: [place];  Service: Orthopedics;  Laterality: Right;   TRANSURETHRAL RESECTION OF PROSTATE N/A 05/14/2021   Procedure: TRANSURETHRAL RESECTION OF THE PROSTATE  (TURP);  Surgeon: Robley Fries, MD;  Location: [facility];  Service: Urology;  Laterality: N/A;  15 MINS       Family History  Problem Relation Age of Onset   CAD Brother        Premature   CAD Sister     Social History   Tobacco Use   Smoking status: Never   Smokeless tobacco: Never  Vaping Use   Vaping Use: Never used  Substance Use Topics   Alcohol use: No   Drug use: No    Home Medications Prior to Admission medications   Medication Sig Start Date End Date Taking? Authorizing Provider  acetaminophen (TYLENOL) 325 MG tablet Take 2 tablets (650 mg total) by mouth every 6 (six) hours as needed for mild pain or headache (or Fever >/= 101). 08/10/20   Domenic Polite, MD  apixaban (ELIQUIS) 5 MG TABS tablet Take 1 tablet (5 mg total) by mouth 2 (two) times daily. 04/16/21   Satira Sark, MD  aspirin EC 81 MG EC tablet Take 1 tablet (81 mg total) by mouth daily. Swallow whole. 09/12/20   Bonnielee Haff, MD  BD PEN NEEDLE NANO U/F 32G X 4 MM MISC 1 each by Other route daily.  03/19/18   [provider]  Biotin 5000 MCG TABS Take 5,000 mcg by mouth daily.    [provider]  budesonide (ENTOCORT EC) 3 MG 24 hr capsule Take 9 mg by mouth daily.    [provider]  Continuous Blood Gluc Sensor (FREESTYLE LIBRE 2 SENSOR) MISC See admin instructions. 10/31/20   [provider]  Cyanocobalamin (B-12) 5000 MCG SUBL Place 5,000 mcg under the tongue daily.    [provider]  enoxaparin (LOVENOX) 120 MG/0.8ML injection Pinch area of skin on abdomen and then inject entire contents of syringe under the skin in the morning on the day before surgery 05/09/21   Satira Sark, MD  fenofibrate 160 MG tablet Take 160 mg by mouth daily.    [provider]  finasteride (PROSCAR) 5 MG tablet Take 5 mg by mouth at bedtime. 08/17/20   [provider]  Insulin Aspart (NOVOLOG FLEXPEN [place]) Inject 0-15 Units into the skin 3 (three) times  daily before meals. Dose per sliding scale    [provider]  Lactobacillus Rhamnosus, GG, (CULTURELLE PO) Take 1 capsule by mouth daily.    [provider]  losartan (COZAAR) 25 MG tablet Take 0.5 tablets (12.5 mg total) by mouth daily. 03/28/21   Satira Sark, MD  memantine (NAMENDA) 10 MG tablet TAKE 1 TABLET(10 MG) BY MOUTH TWICE DAILY 04/08/21   Garvin Fila, MD  metFORMIN (GLUCOPHAGE) 500 MG tablet Take 500 mg by mouth 2 (two) times daily with a meal. 10/10/20   [provider]  metoprolol succinate (TOPROL-XL) 25 MG 24  hr tablet TAKE 1 TABLET(25 MG) BY MOUTH DAILY AFTER SUPPER 04/30/21   Strader, [place] M, PA-C  Multiple Vitamin (MULTIVITAMIN WITH MINERALS) TABS tablet Take 1 tablet by mouth daily.    [provider]  [facility] VERIO test strip 1 each by Other route in the morning, at noon, and at bedtime.  03/20/18   [provider]  rosuvastatin (CRESTOR) 20 MG tablet TAKE 1 TABLET(20 MG) BY MOUTH DAILY 02/25/21   Satira Sark, MD  spironolactone (ALDACTONE) 25 MG tablet Take 0.5 tablets (12.5 mg total) by mouth daily. 12/26/20   Satira Sark, MD  tamsulosin (FLOMAX) 0.4 MG CAPS capsule Take 1 capsule (0.4 mg total) by mouth daily. Patient taking differently: Take 0.4 mg by mouth at bedtime. 08/11/20   Domenic Polite, MD  TOUJEO SOLOSTAR 300 UNIT/ML Solostar Pen Inject 20 Units into the skin daily. Patient taking differently: Inject 15 Units into the skin daily. 7 units this am 08/10/20   Domenic Polite, MD  traMADol (ULTRAM) 50 MG tablet Take 1 tablet (50 mg total) by mouth every 6 (six) hours as needed. 05/15/21 05/15/22  Lucas Mallow, MD    Allergies    Lipitor [atorvastatin] and Sitagliptin  Review of Systems   Review of Systems  Unable to perform ROS: Dementia  Constitutional:  Positive for fever.   Physical Exam Updated Vital Signs BP (!) 89/50   Pulse 89   Temp 98.9 F (37.2 C) (Oral)   Resp 19   Ht 5'  10.5" (1.791 m)   Wt 73.7 kg   SpO2 94%   BMI 22.97 kg/m   Physical Exam Vitals and nursing note reviewed.  Constitutional:      Appearance: He is well-developed.  HENT:     Head: Normocephalic and atraumatic.     Nose: No congestion or rhinorrhea.  Eyes:     Pupils: Pupils are equal, round, and reactive to light.  Cardiovascular:     Rate and Rhythm: Tachycardia present.  Pulmonary:     Effort: Pulmonary effort is normal. No respiratory distress.  Abdominal:     General: Abdomen is flat. There is no distension.  Musculoskeletal:        General: Normal range of motion.     Cervical back: Normal range of motion.  Skin:    General: Skin is warm and dry.     Findings: No erythema.  Neurological:     General: No focal deficit present.     Mental Status: He is alert.    ED Results / Procedures / Treatments   Labs (all labs ordered are listed, but only abnormal results are displayed) Labs Reviewed  COMPREHENSIVE METABOLIC PANEL - Abnormal; Notable for the following components:      Result Value   CO2 20 (*)    Glucose, Bld 273 (*)    Creatinine, Ser 1.43 (*)    Calcium 8.0 (*)    Total Protein 5.1 (*)    Albumin 2.3 (*)    GFR, Estimated 51 (*)    All other components within normal limits  LACTIC ACID, PLASMA - Abnormal; Notable for the following components:   Lactic Acid, Venous 3.6 (*)    All other components within normal limits  CBC WITH DIFFERENTIAL/PLATELET - Abnormal; Notable for the following components:   RBC 3.29 (*)    Hemoglobin 9.9 (*)    HCT 30.7 (*)    Lymphs Abs 0.6 (*)    All other components within  normal limits  PROTIME-INR - Abnormal; Notable for the following components:   Prothrombin Time 16.6 (*)    INR 1.3 (*)    All other components within normal limits  URINALYSIS, ROUTINE W REFLEX MICROSCOPIC - Abnormal; Notable for the following components:   Color, Urine AMBER (*)    APPearance CLOUDY (*)    Glucose, UA >=500 (*)    Hgb urine  dipstick LARGE (*)    Ketones, ur 5 (*)    Protein, ur 30 (*)    Nitrite POSITIVE (*)    Leukocytes,Ua LARGE (*)    RBC / HPF >50 (*)    WBC, UA >50 (*)    Bacteria, UA MANY (*)    All other components within normal limits  CBG MONITORING, ED - Abnormal; Notable for the following components:   Glucose-Capillary 241 (*)    All other components within normal limits  CULTURE, BLOOD (ROUTINE X 2)  RESP PANEL BY RT-PCR (FLU A&B, COVID) ARPGX2  CULTURE, BLOOD (ROUTINE X 2)  LACTIC ACID, PLASMA    EKG None  Radiology DG Chest Port 1 View  Result Date: 05/18/2021 CLINICAL DATA:  Fever, sepsis EXAM: PORTABLE CHEST 1 VIEW COMPARISON:  10/03/2020 FINDINGS: Lungs are well expanded, symmetric, and clear. No pneumothorax or pleural effusion. Coronary artery bypass grafting has been performed. Cardiac size within normal limits. Pulmonary vascularity is normal. Osseous structures are age-appropriate. No acute bone abnormality. IMPRESSION: No active disease. Electronically Signed   By: Fidela Salisbury M.D.   On: 05/18/2021 03:30    Procedures .Critical Care Performed by: Merrily Pew, MD Authorized by: Merrily Pew, MD   Critical care provider statement:    Critical care time (minutes):  32   Critical care time was exclusive of:  Separately billable procedures and treating other patients and teaching time   Critical care was necessary to treat or prevent imminent or life-threatening deterioration of the following conditions:  Sepsis   Critical care was time spent personally by me on the following activities:  Development of treatment plan with patient or surrogate, evaluation of patient's response to treatment, examination of patient, obtaining history from patient or surrogate, review of old charts, re-evaluation of patient's condition, pulse oximetry, ordering and performing treatments and interventions and ordering and review of laboratory studies   Medications Ordered in ED Medications   lactated ringers bolus 1,000 mL (0 mLs Intravenous Stopped 05/18/21 0523)  vancomycin (VANCOREADY) IVPB 1500 mg/300 mL (0 mg Intravenous Stopped 05/18/21 0628)  piperacillin-tazobactam (ZOSYN) IVPB 3.375 g (0 g Intravenous Stopped 05/18/21 0523)  lactated ringers bolus 1,000 mL (1,000 mLs Intravenous New Bag/Given 05/18/21 2440)  lactated ringers bolus 1,000 mL (1,000 mLs Intravenous New Bag/Given 05/18/21 1027)    ED Course  I have reviewed the triage vital signs and the nursing notes.  Pertinent labs & imaging results that were available during my care of the patient were reviewed by me and considered in my medical decision making (see chart for details).    MDM Rules/Calculators/A&P                         Patient here with sepsis likely related to urinary tract infection.  Also had a TURP recently.  Patient had 1 or 2 soft blood pressures but seems to improve pretty easily with fluids.  Lactic acid was above 3 but improved to 1.7.  Low suspicion for shock or severe sepsis.  Discussed with the hospitalist for admission.  Antibiotic started prior to blood cultures being drawn.  Stable for admission.  Final Clinical Impression(s) / ED Diagnoses Final diagnoses:  Lower urinary tract infectious disease    Rx / DC Orders ED Discharge Orders     None        Emeri Estill, Corene Cornea, MD 05/18/21 0700    Iqra Rotundo, Corene Cornea, MD 06/07/21 2305

## 2021-05-18 NOTE — ED Notes (Signed)
Pts HR is dropping to the low 40s. Pts wife brought in CPAP for the pt to use. MD made aware of changes.

## 2021-05-18 NOTE — H&P (Signed)
History and Physical    Patrick Miranda VQM:[id number] DOB: November 12, 1945 DOA: 05/18/2021  PCP: Burnard Bunting, MD   Patient coming from: home  I have personally briefly reviewed patient's old medical records in [place]  Chief Complaint: Fall, weakness and fever.  HPI: Patrick Miranda is a 75 y.o. male with medical history significant of history of stroke, dementia, hypertension, hyperlipidemia, ischemic cardiomyopathy, type 2 diabetes mellitus, obstructive sleep apnea on CPAP, pulmonary embolism on chronic anticoagulation and history of BPH with recent TURP; who presented to the hospital secondary to increased weakness, falls and fever.  Patient denies chest pain, no nausea, no vomiting, and no hematuria.  There has not been any history of cough, sick contacts or new focal deficits.  Patient history is limited secondary to underlying dementia.  COVID PCR in the ED negative; patient received only 1 dose of Moderna vaccine against COVID in September 2021.  ED Course: Patient had met sepsis criteria on admission with tachycardia, fever, elevated WBCs, soft blood pressure, lactic acid elevation and concern of infection in his urinary tract.  Images are demonstrating no acute cardiopulmonary process.  Cultures have been taken, broad-spectrum antibiotics initiated and fluid resuscitation provided.  TRH has been called to place in the hospital for further evaluation and management.  Review of Systems: As per HPI otherwise all other systems reviewed and are negative.  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[place])    Left ventricular mural thrombus    NSTEMI (non-ST elevated myocardial infarction) ([place])    OSA on CPAP    Pulmonary emboli [facility])    May 2021   Skin cancer    Type 2 diabetes mellitus ([place])     Past Surgical History:  Procedure Laterality Date   COLON SURGERY     COLONOSCOPY W/ POLYPECTOMY     CORONARY ARTERY BYPASS GRAFT N/A 07/09/2020   Procedure: CORONARY ARTERY BYPASS GRAFTING (CABG) TIMES THREE , USING LEFT INTERNAL MAMMARY ARTERY AND RIGHT LEG GREATER SAPHENOUS VEIN HARVESTED ENDOSCOPICALLY;  Surgeon: Ivin Poot, MD;  Location: [place];  Service: Open Heart Surgery;  Laterality: N/A;   LEFT HEART CATH AND CORONARY ANGIOGRAPHY N/A 07/04/2020   Procedure: LEFT HEART CATH AND CORONARY ANGIOGRAPHY;  Surgeon: Troy Sine, MD;  Location: [place] [facility];  Service: Cardiovascular;  Laterality: N/A;   TEE WITHOUT CARDIOVERSION N/A 07/09/2020   Procedure: TRANSESOPHAGEAL ECHOCARDIOGRAM (TEE);  Surgeon: Prescott Gum, Collier Salina, MD;  Location: [place];  Service: Open Heart Surgery;  Laterality: N/A;   TOTAL KNEE ARTHROPLASTY Left 04/22/2013   Procedure: TOTAL KNEE ARTHROPLASTY- left;  Surgeon: Augustin Schooling, MD;  Location: [place];  Service: Orthopedics;  Laterality: Left;  with femoral block   TOTAL KNEE ARTHROPLASTY Right 01/06/2014   Procedure: RIGHT TOTAL KNEE ARTHROPLASTY;  Surgeon: Augustin Schooling, MD;  Location: [place];  Service: Orthopedics;  Laterality: Right;   TRANSURETHRAL RESECTION OF PROSTATE N/A 05/14/2021   Procedure: TRANSURETHRAL RESECTION OF  THE PROSTATE (TURP);  Surgeon: Robley Fries, MD;  Location: [facility];  Service: Urology;  Laterality: N/A;  34 MINS    Social History  reports that he has never smoked. He has never used smokeless tobacco. He reports that he does not drink alcohol and does not use drugs.  Allergies  Allergen Reactions   Lipitor [Atorvastatin] Other (See Comments)    Muscle pain   Sitagliptin     Myalgias    Family  History  Problem Relation Age of Onset   CAD Brother        Premature   CAD Sister     Prior to Admission medications   Medication Sig Start Date End Date Taking? Authorizing Provider  acetaminophen (TYLENOL) 325 MG tablet Take 2 tablets (650 mg total) by mouth every 6 (six) hours as needed for mild pain or headache (or Fever >/= 101). 08/10/20   Domenic Polite, MD  apixaban (ELIQUIS) 5 MG TABS tablet Take 1 tablet (5 mg total) by mouth 2 (two) times daily. 04/16/21   Satira Sark, MD  aspirin EC 81 MG EC tablet Take 1 tablet (81 mg total) by mouth daily. Swallow whole. 09/12/20   Bonnielee Haff, MD  BD PEN NEEDLE NANO U/F 32G X 4 MM MISC 1 each by Other route daily.  03/19/18   [provider]  Biotin 5000 MCG TABS Take 5,000 mcg by mouth daily.    [provider]  budesonide (ENTOCORT EC) 3 MG 24 hr capsule Take 9 mg by mouth daily.    [provider]  Continuous Blood Gluc Sensor (FREESTYLE LIBRE 2 SENSOR) MISC See admin instructions. 10/31/20   [provider]  Cyanocobalamin (B-12) 5000 MCG SUBL Place 5,000 mcg under the tongue daily.    [provider]  enoxaparin (LOVENOX) 120 MG/0.8ML injection Pinch area of skin on abdomen and then inject entire contents of syringe under the skin in the morning on the day before surgery 05/09/21   Satira Sark, MD  fenofibrate 160 MG tablet Take 160 mg by mouth daily.    [provider]  finasteride (PROSCAR) 5 MG tablet Take 5 mg by mouth at bedtime. 08/17/20   [provider]  Insulin Aspart (NOVOLOG FLEXPEN [place]) Inject 0-15 Units into the skin 3 (three) times daily before meals. Dose per sliding scale    [provider]  Lactobacillus Rhamnosus, GG, (CULTURELLE PO) Take 1 capsule by mouth daily.    [provider]  losartan (COZAAR) 25 MG tablet Take 0.5 tablets (12.5 mg total) by mouth daily. 03/28/21   Satira Sark, MD  memantine (NAMENDA) 10 MG tablet  TAKE 1 TABLET(10 MG) BY MOUTH TWICE DAILY 04/08/21   Garvin Fila, MD  metFORMIN (GLUCOPHAGE) 500 MG tablet Take 500 mg by mouth 2 (two) times daily with a meal. 10/10/20   [provider]  metoprolol succinate (TOPROL-XL) 25 MG 24 hr tablet TAKE 1 TABLET(25 MG) BY MOUTH DAILY AFTER SUPPER 04/30/21   Strader, [place] M, PA-C  Multiple Vitamin (MULTIVITAMIN WITH MINERALS) TABS tablet Take 1 tablet by mouth daily.    [provider]  [facility] VERIO test strip 1 each by Other route in the morning, at noon, and at bedtime.  03/20/18   [provider]  rosuvastatin (CRESTOR) 20 MG tablet TAKE 1 TABLET(20 MG) BY MOUTH DAILY 02/25/21   Satira Sark, MD  spironolactone (ALDACTONE) 25 MG tablet Take 0.5 tablets (12.5 mg total) by  mouth daily. 12/26/20   Satira Sark, MD  tamsulosin (FLOMAX) 0.4 MG CAPS capsule Take 1 capsule (0.4 mg total) by mouth daily. Patient taking differently: Take 0.4 mg by mouth at bedtime. 08/11/20   Domenic Polite, MD  TOUJEO SOLOSTAR 300 UNIT/ML Solostar Pen Inject 20 Units into the skin daily. Patient taking differently: Inject 15 Units into the skin daily. 7 units this am 08/10/20   Domenic Polite, MD  traMADol (ULTRAM) 50 MG tablet Take 1 tablet (50 mg total) by mouth every 6 (six) hours as needed. 05/15/21 05/15/22  Lucas Mallow, MD    Physical Exam: Vitals:   05/18/21 0530 05/18/21 0600 05/18/21 0630 05/18/21 0700  BP: (!) 101/52 (!) 89/50 (!) 95/52 (!) 90/51  Pulse: (!) 102 89 80 78  Resp: (!) _0 Temp:      TempSrc:      SpO2: 95% 94% 90% 93%  Weight:      Height:        Constitutional: No chest pain, no nausea, no vomiting.  Patient is afebrile. Vitals:   05/18/21 0530 05/18/21 0600 05/18/21 0630 05/18/21 0700  BP: (!) 101/52 (!) 89/50 (!) 95/52 (!) 90/51  Pulse: (!) 102 89 80 78  Resp: (!) _1 Temp:      TempSrc:      SpO2: 95% 94% 90% 93%  Weight:      Height:       Eyes: PERRL, lids and  conjunctivae normal, no icterus, no nystagmus. ENMT: Mucous membranes are moist. Posterior pharynx clear of any exudate or lesions. Neck: normal, supple, no masses, no thyromegaly, no JVD. Respiratory: clear to auscultation bilaterally, no wheezing, no crackles. Normal respiratory effort. No accessory muscle use.  Cardiovascular: Sinus tachycardia, no rubs, no gallops, no JVD on exam.  No lower extremity edema appreciated. Abdomen: no tenderness, no masses palpated. No hepatosplenomegaly. Bowel sounds positive.  Musculoskeletal: no clubbing / cyanosis. No joint deformity upper and lower extremities. Good ROM, no contractures. Normal muscle tone.  Skin: no petechiae. Neurologic: CN 2-12 grossly intact.  Following commands appropriately. Psychiatric: Judgment and insight impaired secondary to underlying dementia.  Labs on Admission: I have personally reviewed following labs and imaging studies  CBC: Recent Labs  Lab 05/15/21 1937 05/18/21 0240  WBC 11.3* 7.0  NEUTROABS 8.4* 6.3  HGB 10.7* 9.9*  HCT 33.2* 30.7*  MCV 92.2 93.3  PLT 363 127    Basic Metabolic Panel: Recent Labs  Lab 05/15/21 1937 05/18/21 0240  NA 135 136  K 3.5 3.6  CL 103 107  CO2 25 20*  GLUCOSE 101* 273*  BUN 21 19  CREATININE 1.32* 1.43*  CALCIUM 8.3* 8.0*    GFR: Estimated Creatinine Clearance: 45.8 mL/min (A) (by C-G formula based on SCr of 1.43 mg/dL (H)).  Liver Function Tests: Recent Labs  Lab 05/15/21 1937 05/18/21 0240  AST 23 33  ALT 8 10  ALKPHOS 42 88  BILITOT 0.3 0.4  PROT 5.2* 5.1*  ALBUMIN 2.4* 2.3*    Urine analysis:    Component Value Date/Time   COLORURINE AMBER (A) 05/18/2021 0552   APPEARANCEUR CLOUDY (A) 05/18/2021 0552   LABSPEC 1.021 05/18/2021 0552   PHURINE 5.0 05/18/2021 0552   GLUCOSEU >=500 (A) 05/18/2021 0552   HGBUR LARGE (A) 05/18/2021 0552   BILIRUBINUR NEGATIVE 05/18/2021 0552   KETONESUR 5 (A) 05/18/2021 0552   PROTEINUR 30 (A) 05/18/2021 0552    UROBILINOGEN 0.2  01/08/2014 1550   NITRITE POSITIVE (A) 05/18/2021 0552   LEUKOCYTESUR LARGE (A) 05/18/2021 0552    Radiological Exams on Admission: DG Chest Port 1 View  Result Date: 05/18/2021 CLINICAL DATA:  Fever, sepsis EXAM: PORTABLE CHEST 1 VIEW COMPARISON:  10/03/2020 FINDINGS: Lungs are well expanded, symmetric, and clear. No pneumothorax or pleural effusion. Coronary artery bypass grafting has been performed. Cardiac size within normal limits. Pulmonary vascularity is normal. Osseous structures are age-appropriate. No acute bone abnormality. IMPRESSION: No active disease. Electronically Signed   By: Fidela Salisbury M.D.   On: 05/18/2021 03:30    EKG: None  Assessment/Plan 1-severe sepsis secondary to UTI and recent urologic instrumentation (therapy) -Chest x-ray without acute cardiopulmonary process -Follow culture results -Continue IV antibiotics -Fluid resuscitation has been provided -Initially lactic acid was elevated but responded appropriately to fluid resuscitation and is now within normal limits. -Continue the use of antipyretics as needed. -Patient will be placed in the stepdown; blood pressure is soft probably triggered by the use of antihypertensive agents prior to admission; but just in case conditions further prevail due to infection and my injury requiring pressors.  2-hypertension -Holding antihypertensive agents currently in the setting of soft blood pressure in the presence of infection.  3-history of dementia -No behavioral disturbances appreciated -Continue the use of Namenda  4-type 2 diabetes mellitus -Holding oral hypoglycemic agent -Will use sliding scale insulin and semglee -Follow CBGs and adjust hypoglycemic regimen as needed -Checking A1c.  5-hyperlipidemia -Continue statins.  6-history of BPH status posttherapy -Continue Flomax-finasteride.  7-prior history of pulmonary embolism -Continue treatment with Eliquis.  DVT  prophylaxis: Chronically on Eliquis. Code Status:   Full code Family Communication:  No family at bedside. Disposition Plan:   Patient is from:  Home  Anticipated DC to:  To be determined  Anticipated DC date:  Approximately 3 days based on patient's response.  Anticipated DC barriers: Resolution of sepsis features.  Consults called:  None Admission status:  Inpatient, stepdown, length of stay more than 2 midnights.  Severity of Illness: The appropriate patient status for this patient is INPATIENT. Inpatient status is judged to be reasonable and necessary in order to provide the required intensity of service to ensure the patient's safety. The patient's presenting symptoms, physical exam findings, and initial radiographic and laboratory data in the context of their chronic comorbidities is felt to place them at high risk for further clinical deterioration. Furthermore, it is not anticipated that the patient will be medically stable for discharge from the hospital within 2 midnights of admission.   * I certify that at the point of admission it is my clinical judgment that the patient will require inpatient hospital care spanning beyond 2 midnights from the point of admission due to high intensity of service, high risk for further deterioration and high frequency of surveillance required.Barton Dubois MD [facility]  How to contact the [facility] Attending or Consulting provider [place] or covering provider during after hours [place], for this patient?   Check the care team in [facility] and look for a) attending/consulting TRH provider listed and b) the [facility] team listed Log into [email or web address] and use [place]'s universal password to access. If you do not have the password, please contact the hospital operator. Locate the [facility] provider you are looking for under [facility] and page to a number that you can be directly reached. If you still have difficulty reaching the provider, please  page the [facility] (  Director on Call) for the Hospitalists listed on amion for assistance.  05/18/2021, 8:45 AM

## 2021-05-18 NOTE — ED Triage Notes (Signed)
Pt brought in with [facility] from home after fall. Pt noted by EMS to have fever. Pt has had several recent falls since being discharged from [facility] after having TURP procedure.

## 2021-05-18 NOTE — ED Notes (Signed)
Pts BP is 85/44 and pt is febrile after tylenol @ 101.5. MD made aware

## 2021-05-18 NOTE — Progress Notes (Signed)
Pharmacy Antibiotic Note  Patrick Miranda a 75 y.o. male admitted on 05/18/2021 with UTI.  Pharmacy has been consulted for vancomycin and cefepime dosing.  Plan: Cefepime 2gm IV every 12 hours.  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 s/p CABG in 06/2020 with LIMA-LAD, SVG-D1 and SVG-RCA   COVID-07 Dec 2019   Dementia [facility])    Depression    Dysrhythmia    Essential hypertension    Hernia, umbilical    HOH (hard of hearing)    Hyperlipidemia    Ischemic cardiomyopathy    Ischemic necrosis of small bowel ([place])    Left ventricular mural thrombus    NSTEMI (non-ST elevated myocardial infarction) ([facility])    OSA on CPAP    Pulmonary emboli [facility])    May 2021   Skin cancer    Type 2 diabetes mellitus ([place])     Allergies:  Allergies  Allergen Reactions   Lipitor [Atorvastatin] Other (See Comments)    Muscle pain   Sitagliptin     Myalgias    Filed Weights   05/18/21 0250  Weight: 73.7 kg (162 lb 6.4 oz)    CBC Latest Ref Rng & Units 05/18/2021 05/15/2021 05/08/2021  WBC 4.0 - 10.5 K/uL 7.0 11.3(H) 6.7  Hemoglobin 13.0 - 17.0 g/dL 9.9(L) 10.7(L) 10.9(L)  Hematocrit 39.0 - 52.0 % 30.7(L) 33.2(L) 34.6(L)  Platelets 150 - 400 K/uL 306 363 389     Estimated Creatinine Clearance: 45.8 mL/min (A) (by C-G formula based on SCr of 1.43 mg/dL (H)).  Antibiotics Given (last 72 hours)     Date/Time Action Medication Dose Rate   05/18/21 0419 New Bag/Given   piperacillin-tazobactam (ZOSYN) IVPB 3.375 g 3.375 g 100 mL/hr   05/18/21 0425 New Bag/Given   vancomycin (VANCOREADY) IVPB 1500 mg/300 mL 1,500 mg 150 mL/hr       Antimicrobials this admission:  Cefepime 05/18/2021  >>  vancomycin 05/18/2021 x 1  Zosyn 05/18/2021  x 1   Microbiology results: 05/18/2021  BCx: sent 05/18/2021  Resp Panel: negative     Thank you for allowing  pharmacy to be a part of this patient's care.  Thomasenia Sales, PharmD Clinical Pharmacist

## 2021-05-18 NOTE — ED Notes (Signed)
Noticed pt to be shaking and HR elevated at 124. Sats 100% on room air. Rectal temp checked 100 F. Tylenol was given. Lungs sounds clear.

## 2021-05-18 NOTE — Progress Notes (Signed)
Reviewing patient's chart prior to ICU admit. Pt history of CVA, PE, and recent TURP procedure 10/25 at [facility] where pt was off PO eliquis for procedure. Patient presents to [facility] ED for multiple falls with admitting diagnosis of sepsis. This RN suggested head CT to attending physician and ED RN to rule out clot or bleed. MD notified this RN that history reports pt has not hit his head and weakness is due to sepsis with no focal deficits. ED RN notified this RN that head CT was obtained on 10/26 and was negative and that pt "does not present in any manor that would suspect a bleed or a clot." No new orders received. Report received with oncoming RN Quitman Livings.

## 2021-05-19 DIAGNOSIS — I5022 Chronic systolic (congestive) heart failure: Secondary | ICD-10-CM | POA: Diagnosis not present

## 2021-05-19 DIAGNOSIS — A419 Sepsis, unspecified organism: Secondary | ICD-10-CM | POA: Diagnosis not present

## 2021-05-19 DIAGNOSIS — E1165 Type 2 diabetes mellitus with hyperglycemia: Secondary | ICD-10-CM

## 2021-05-19 DIAGNOSIS — E785 Hyperlipidemia, unspecified: Secondary | ICD-10-CM | POA: Diagnosis not present

## 2021-05-19 DIAGNOSIS — N390 Urinary tract infection, site not specified: Secondary | ICD-10-CM | POA: Diagnosis not present

## 2021-05-19 LAB — BLOOD CULTURE ID PANEL (REFLEXED) - BCID2

## 2021-05-19 LAB — BASIC METABOLIC PANEL
Anion gap: 6 (ref 5–15)
BUN: 24 mg/dL — ABNORMAL HIGH (ref 8–23)
CO2: 20 mmol/L — ABNORMAL LOW (ref 22–32)
Calcium: 7.5 mg/dL — ABNORMAL LOW (ref 8.9–10.3)
Chloride: 111 mmol/L (ref 98–111)
Creatinine, Ser: 1.34 mg/dL — ABNORMAL HIGH (ref 0.61–1.24)
GFR, Estimated: 55 mL/min — ABNORMAL LOW (ref >60)
Glucose, Bld: 312 mg/dL — ABNORMAL HIGH (ref 70–99)
Potassium: 4 mmol/L (ref 3.5–5.1)
Sodium: 137 mmol/L (ref 135–145)

## 2021-05-19 LAB — GLUCOSE, CAPILLARY
Glucose-Capillary: 279 mg/dL — ABNORMAL HIGH (ref 70–99)
Glucose-Capillary: 396 mg/dL — ABNORMAL HIGH (ref 70–99)
Glucose-Capillary: 420 mg/dL — ABNORMAL HIGH (ref 70–99)
Glucose-Capillary: 346 mg/dL — ABNORMAL HIGH (ref 70–99)

## 2021-05-19 LAB — MRSA NEXT GEN BY PCR, NASAL: MRSA by PCR Next Gen: NOT DETECTED

## 2021-05-19 MED ORDER — ORAL CARE MOUTH RINSE
15.0000 mL | Freq: Two times a day (BID) | OROMUCOSAL | Status: DC
  Administered 2021-05-19 – 2021-05-21 (×4): 15 mL via OROMUCOSAL

## 2021-05-19 MED ORDER — CHLORHEXIDINE GLUCONATE 0.12 % MT SOLN
15.0000 mL | Freq: Two times a day (BID) | OROMUCOSAL | Status: DC
  Administered 2021-05-19 – 2021-05-21 (×5): 15 mL via OROMUCOSAL
  Filled 2021-05-19 (×5): qty 15

## 2021-05-19 MED ORDER — POLYVINYL ALCOHOL 1.4 % OP SOLN
1.0000 [drp] | OPHTHALMIC | Status: DC | PRN
  Administered 2021-05-19: 1 [drp] via OPHTHALMIC
  Filled 2021-05-19: qty 15

## 2021-05-19 MED ORDER — INSULIN GLARGINE-YFGN 100 UNIT/ML ~~LOC~~ SOLN
22.0000 [IU] | Freq: Every day | SUBCUTANEOUS | Status: DC
  Administered 2021-05-20: 22 [IU] via SUBCUTANEOUS
  Filled 2021-05-19 (×2): qty 0.22

## 2021-05-19 MED ORDER — HYDROCORTISONE SOD SUC (PF) 100 MG IJ SOLR
50.0000 mg | Freq: Three times a day (TID) | INTRAMUSCULAR | Status: DC
  Administered 2021-05-19 – 2021-05-20 (×3): 50 mg via INTRAVENOUS
  Filled 2021-05-19 (×3): qty 2

## 2021-05-19 NOTE — Progress Notes (Signed)
Per wife PROGRESS NOTE    Patrick Miranda  OQH:[id number] DOB: [birth year] DOA: 05/18/2021 PCP: Burnard Bunting, MD    Chief Complaint  Patient presents with   Fall   Fever    Brief Narrative:  Patrick Miranda is a 75 y.o. male with medical history significant of history of stroke, dementia, hypertension, hyperlipidemia, ischemic cardiomyopathy, type 2 diabetes mellitus, obstructive sleep apnea on CPAP, pulmonary embolism on chronic anticoagulation and history of BPH with recent TURP; who presented to the hospital secondary to increased weakness, falls and fever.  Patient denies chest pain, no nausea, no vomiting, and no hematuria.  There has not been any history of cough, sick contacts or new focal deficits.  Patient history is limited secondary to underlying dementia.   COVID PCR in the ED negative; patient received only 1 dose of Moderna vaccine against COVID in September 2021.   ED Course: Patient had met sepsis criteria on admission with tachycardia, fever, elevated WBCs, soft blood pressure, lactic acid elevation and concern of infection in his urinary tract.  Images are demonstrating no acute cardiopulmonary process.  Cultures have been taken, broad-spectrum antibiotics initiated and fluid resuscitation provided.  TRH has been called to place in the hospital for further evaluation and management.  Assessment & Plan: 1-severe sepsis secondary to UTI and recent urologic instrumentation (status post TURP) -Patient met sepsis criteria at time of admission with tachycardia, fever, leukocytosis, soft blood pressure, elevated lactic acid level and having urine as a source of infection.  He was a slightly more confused than at baseline according to patient's wife which would be organ dysfunction presentation.  Creatinine level was also higher than his baseline. -Continue to follow culture results -Continue current IV antibiotic -Maintain adequate fluid resuscitation -Lactic acid  resolved and within normal limits -Patient is currently afebrile. -Continue the use of antipyretics as needed and provide supportive care -At this moment patient is safe to be transferred to telemetry bed. -Start decreasing Solu-Cortef.  2-hypertension -Blood pressure stable currently -Continue holding antihypertensive agents at this time -Continue to follow vital signs.  3-history of dementia -No behavioral disturbances appreciated at time of admission. -Patient was slightly more confused as per wife reports; currently essentially back to his baseline. -Continue the use of Namenda -Continue supportive care.  4-type 2 diabetes mellitus with hyperglycemia -Continue holding oral hypoglycemic agents -Continue sliding scale insulin and Semglee -Follow CBGs and further adjust hypoglycemic regimen as needed. -CBGs probably higher than her baseline secondary to the use of Solu-Cortef at time of admission.  5-hyperlipidemia -Continue statin  6-history of BPH status post TURP -Continue the use of Flomax and finasteride -Patient reports no retention symptoms.  7-history of pulmonary embolism -Continue treatment with Eliquis.  8-chronic systolic heart failure -Ejection fraction 45% on most recent echocardiogram in September 2022 -Will plan to resume home heart failure management once blood pressure and sepsis features further stabilized. -Continue to follow daily weights, intake and output, low-sodium diet and judicious fluid resuscitation.   DVT prophylaxis: Chronically on Eliquis Code Status: Full code. Family Communication: Wife at bedside. Disposition:   Status is: Inpatient   Consultants:  None  Procedures:  See below for x-ray reports.  Antimicrobials:  Cefepime and  Subjective: Afebrile, no chest pain, no nausea, no vomiting.  Currently denying dysuria; patient expressed some intermittent chills and is pleasantly confused (per wife at bedside, he is at  baseline).  Objective: Vitals:   05/19/21 1300 05/19/21 1430 05/19/21 1500 05/19/21 1600  BP:  136/77 (!) 142/56 (!) 139/52  Pulse: 62 72 62 (!) 55  Resp: 19 (!) 21 19 (!) 21  Temp:      TempSrc:      SpO2: 98% 96% 97% 94%  Weight:      Height:        Intake/Output Summary (Last 24 hours) at 05/19/2021 1647 Last data filed at 05/19/2021 0538 Gross per 24 hour  Intake 1537.9 ml  Output 500 ml  Net 1037.9 ml   Filed Weights   05/18/21 0250 05/18/21 1917 05/19/21 0500  Weight: 73.7 kg 80.8 kg 81.2 kg    Examination:  General exam: Appears calm and comfortable, pleasantly confused and following commands appropriately.  Bedside he is baseline.  No chest pain, no nausea, no vomiting.  Patient is currently afebrile. Respiratory system: Good air movement bilaterally, no using accessory muscles, no wheezing or crackles appreciated on exam. Cardiovascular system: S1 & S2 heard, rate controlled. No JVD, no rubs, no gallops, no pedal edema on exam. Gastrointestinal system: Abdomen is nondistended, soft and nontender.  Positive bowel sounds appreciated. Central nervous system: No focal neurological deficits. Extremities: No cyanosis or clubbing. Skin: No petechiae. Psychiatry: Judgement and insight appear impaired secondary to underlying dementia.  Good affect and a stable mood.    Data Reviewed: I have personally reviewed following labs and imaging studies  CBC: Recent Labs  Lab 05/15/21 1937 05/18/21 0240  WBC 11.3* 7.0  NEUTROABS 8.4* 6.3  HGB 10.7* 9.9*  HCT 33.2* 30.7*  MCV 92.2 93.3  PLT 363 240    Basic Metabolic Panel: Recent Labs  Lab 05/15/21 1937 05/18/21 0240 05/19/21 0339  NA 135 136 137  K 3.5 3.6 4.0  CL 103 107 111  CO2 25 20* 20*  GLUCOSE 101* 273* 312*  BUN 21 19 24*  CREATININE 1.32* 1.43* 1.34*  CALCIUM 8.3* 8.0* 7.5*    GFR: Estimated Creatinine Clearance: 49.2 mL/min (A) (by C-G formula based on SCr of 1.34 mg/dL (H)).  Liver Function  Tests: Recent Labs  Lab 05/15/21 1937 05/18/21 0240  AST 23 33  ALT 8 10  ALKPHOS 42 88  BILITOT 0.3 0.4  PROT 5.2* 5.1*  ALBUMIN 2.4* 2.3*    CBG: Recent Labs  Lab 05/18/21 1657 05/18/21 2112 05/19/21 0810 05/19/21 1155 05/19/21 1642  GLUCAP 339* 288* 279* 396* 420*    Recent Results (from the past 240 hour(s))  SARS Coronavirus 2 (TAT 6-24 hrs)     Status: None   Collection Time: 05/10/21 12:00 AM  Result Value Ref Range Status   SARS Coronavirus 2 RESULT: NEGATIVE  Final    Comment: RESULT: NEGATIVESARS-CoV-2 INTERPRETATION:A NEGATIVE  test result means that SARS-CoV-2 RNA was not present in the specimen above the limit of detection of this test. This does not preclude a possible SARS-CoV-2 infection and should not be used as the  sole basis for patient management decisions. Negative results must be combined with clinical observations, patient history, and epidemiological information. Optimum specimen types and timing for peak viral levels during infections caused by SARS-CoV-2  have not been determined. Collection of multiple specimens or types of specimens may be necessary to detect virus. Improper specimen collection and handling, sequence variability under primers/probes, or organism present below the limit of detection may  lead to false negative results. Positive and negative predictive values of testing are highly dependent on prevalence. False negative test results are more likely when prevalence of disease is high.The expected result is NEGATIVE.Fact  S heet for  Healthcare Providers: LocalChronicle.no Sheet for Patients: SalonLookup.es Reference Range - Negative   Culture, blood (Routine x 2)     Status: None (Preliminary result)   Collection Time: 05/18/21  2:40 AM   Specimen: BLOOD RIGHT ARM  Result Value Ref Range Status   Specimen Description BLOOD RIGHT ARM  Final   Special Requests   Final     BOTTLES DRAWN AEROBIC AND ANAEROBIC Blood Culture adequate volume   Culture   Final    NO GROWTH 1 DAY Performed at [facility], [address], [place], [place] [address]    Report Status PENDING  Incomplete  Resp Panel by RT-PCR (Flu A&B, Covid) Nasopharyngeal Swab     Status: None   Collection Time: 05/18/21  2:40 AM   Specimen: Nasopharyngeal Swab; Nasopharyngeal(NP) swabs in vial transport medium  Result Value Ref Range Status   SARS Coronavirus 2 by RT PCR NEGATIVE NEGATIVE Final    Comment: (NOTE) SARS-CoV-2 target nucleic acids are NOT DETECTED.  The SARS-CoV-2 RNA is generally detectable in upper respiratory specimens during the acute phase of infection. The lowest concentration of SARS-CoV-2 viral copies this assay can detect is 138 copies/mL. A negative result does not preclude SARS-Cov-2 infection and should not be used as the sole basis for treatment or other patient management decisions. A negative result may occur with  improper specimen collection/handling, submission of specimen other than nasopharyngeal swab, presence of viral mutation(s) within the areas targeted by this assay, and inadequate number of viral copies(<138 copies/mL). A negative result must be combined with clinical observations, patient history, and epidemiological information. The expected result is Negative.  Fact Sheet for Patients:  EntrepreneurPulse.com.au  Fact Sheet for Healthcare Providers:  IncredibleEmployment.be  This test is no t yet approved or cleared by the [place] FDA and  has been authorized for detection and/or diagnosis of SARS-CoV-2 by FDA under an Emergency Use Authorization (EUA). This EUA will remain  in effect (meaning this test can be used) for the duration of the COVID-19 declaration under Section 564(b)(1) of the Act, 21 U.S.C.section 360bbb-3(b)(1), unless the authorization is terminated  or revoked sooner.       Influenza  A by PCR NEGATIVE NEGATIVE Final   Influenza B by PCR NEGATIVE NEGATIVE Final    Comment: (NOTE) The Xpert Xpress SARS-CoV-2/FLU/RSV plus assay is intended as an aid in the diagnosis of influenza from Nasopharyngeal swab specimens and should not be used as a sole basis for treatment. Nasal washings and aspirates are unacceptable for Xpert Xpress SARS-CoV-2/FLU/RSV testing.  Fact Sheet for Patients: EntrepreneurPulse.com.au  Fact Sheet for Healthcare Providers: IncredibleEmployment.be  This test is not yet approved or cleared by the [place] FDA and has been authorized for detection and/or diagnosis of SARS-CoV-2 by FDA under an Emergency Use Authorization (EUA). This EUA will remain in effect (meaning this test can be used) for the duration of the COVID-19 declaration under Section 564(b)(1) of the Act, 21 U.S.C. section 360bbb-3(b)(1), unless the authorization is terminated or revoked.  Performed at [facility], [address], [place], [place] [address]   Culture, blood (Routine x 2)     Status: None (Preliminary result)   Collection Time: 05/18/21  3:17 AM   Specimen: BLOOD RIGHT FOREARM  Result Value Ref Range Status   Specimen Description   Final    BLOOD RIGHT FOREARM Performed at [facility], [address] [place], [place] [address]    Special Requests  Final    BOTTLES DRAWN AEROBIC AND ANAEROBIC Blood Culture adequate volume Performed at [facility], [address], [place], [place] [address]    Culture  Setup Time   Final    ANAEROBIC BOTTLE GRAM POSITIVE COCCI CRITICAL RESULT CALLED TO, READ BACK BY AND VERIFIED WITH: COE,H 1809 05/18/2021 [place]LE[place],R GNR SEEN ON GRAM STAIN, REPORTED RESULT TO L HENDERSON _0  05/19/21 [place] Performed at [facility], [place] [address], [place], [place] [address]    Culture   Final    NO GROWTH 1 DAY Performed at [facility], [address] [place], [place] [address]    Report Status  PENDING  Incomplete  Blood Culture ID Panel (Reflexed)     Status: Abnormal   Collection Time: 05/18/21  3:17 AM  Result Value Ref Range Status   Enterococcus faecalis NOT DETECTED NOT DETECTED Final   Enterococcus Faecium NOT DETECTED NOT DETECTED Final   Listeria monocytogenes NOT DETECTED NOT DETECTED Final   Staphylococcus species NOT DETECTED NOT DETECTED Final   Staphylococcus aureus (BCID) NOT DETECTED NOT DETECTED Final   Staphylococcus epidermidis NOT DETECTED NOT DETECTED Final   Staphylococcus lugdunensis NOT DETECTED NOT DETECTED Final   Streptococcus species NOT DETECTED NOT DETECTED Final   Streptococcus agalactiae NOT DETECTED NOT DETECTED Final   Streptococcus pneumoniae NOT DETECTED NOT DETECTED Final   Streptococcus pyogenes NOT DETECTED NOT DETECTED Final   A.calcoaceticus-baumannii NOT DETECTED NOT DETECTED Final   Bacteroides fragilis NOT DETECTED NOT DETECTED Final   Enterobacterales DETECTED (A) NOT DETECTED Final    Comment: Enterobacterales represent a large order of gram negative bacteria, not a single organism. CRITICAL RESULT CALLED TO, READ BACK BY AND VERIFIED WITH: REPORTED RESULT TO L HENDERSON _1  05/19/21 [place]    Enterobacter cloacae complex NOT DETECTED NOT DETECTED Final   Escherichia coli NOT DETECTED NOT DETECTED Final   Klebsiella aerogenes NOT DETECTED NOT DETECTED Final   Klebsiella oxytoca NOT DETECTED NOT DETECTED Final   Klebsiella pneumoniae DETECTED (A) NOT DETECTED Final    Comment: CRITICAL RESULT CALLED TO, READ BACK BY AND VERIFIED WITH: REPORTED RESULT TO L HENDERSON _2  05/19/21 [place]    Proteus species NOT DETECTED NOT DETECTED Final   Salmonella species NOT DETECTED NOT DETECTED Final   Serratia marcescens NOT DETECTED NOT DETECTED Final   Haemophilus influenzae NOT DETECTED NOT DETECTED Final   Neisseria meningitidis NOT DETECTED NOT DETECTED Final   Pseudomonas aeruginosa NOT DETECTED NOT DETECTED Final   Stenotrophomonas  maltophilia NOT DETECTED NOT DETECTED Final   Candida albicans NOT DETECTED NOT DETECTED Final   Candida auris NOT DETECTED NOT DETECTED Final   Candida glabrata NOT DETECTED NOT DETECTED Final   Candida krusei NOT DETECTED NOT DETECTED Final   Candida parapsilosis NOT DETECTED NOT DETECTED Final   Candida tropicalis NOT DETECTED NOT DETECTED Final   Cryptococcus neoformans/gattii NOT DETECTED NOT DETECTED Final   CTX-M ESBL NOT DETECTED NOT DETECTED Final   Carbapenem resistance IMP NOT DETECTED NOT DETECTED Final   Carbapenem resistance KPC NOT DETECTED NOT DETECTED Final   Carbapenem resistance NDM NOT DETECTED NOT DETECTED Final   Carbapenem resist OXA 48 LIKE NOT DETECTED NOT DETECTED Final   Carbapenem resistance VIM NOT DETECTED NOT DETECTED Final    Comment: Performed at [facility], [address], [place], [place] [address]  MRSA Next Gen by PCR, Nasal     Status: None   Collection Time: 05/18/21  7:22 PM  Specimen: Nasal Mucosa; Nasal Swab  Result Value Ref Range Status   MRSA by PCR Next Gen NOT DETECTED NOT DETECTED Final    Comment: (NOTE) The GeneXpert MRSA Assay (FDA approved for NASAL specimens only), is one component of a comprehensive MRSA colonization surveillance program. It is not intended to diagnose MRSA infection nor to guide or monitor treatment for MRSA infections. Test performance is not FDA approved in patients less than 42 years old. Performed at [facility], [address] [place], [place] [address]      Radiology Studies: [facility] Chest [facility] 1 View  Result Date: 05/18/2021 CLINICAL DATA:  Fever, sepsis EXAM: PORTABLE CHEST 1 VIEW COMPARISON:  10/03/2020 FINDINGS: Lungs are well expanded, symmetric, and clear. No pneumothorax or pleural effusion. Coronary artery bypass grafting has been performed. Cardiac size within normal limits. Pulmonary vascularity is normal. Osseous structures are age-appropriate. No acute bone abnormality. IMPRESSION: No  active disease. Electronically Signed   By: Fidela Salisbury M.D.   On: 05/18/2021 03:30    Scheduled Meds:  apixaban  5 mg Oral BID   aspirin EC  81 mg Oral Daily   chlorhexidine  15 mL Mouth Rinse BID   Chlorhexidine Gluconate Cloth  6 each Topical Daily   finasteride  5 mg Oral QHS   hydrocortisone sod succinate (SOLU-CORTEF) inj  100 mg Intravenous Q8H   insulin aspart  0-5 Units Subcutaneous QHS   insulin aspart  0-9 Units Subcutaneous TID WC   insulin glargine-yfgn  22 Units Subcutaneous QHS   mouth rinse  15 mL Mouth Rinse q12n4p   memantine  10 mg Oral BID   rosuvastatin  20 mg Oral Daily   saccharomyces boulardii  250 mg Oral BID   tamsulosin  0.4 mg Oral QHS   Continuous Infusions:  ceFEPime (MAXIPIME) IV 2 g (05/19/21 0853)     LOS: 1 day    Time spent: 35 minutes   Barton Dubois, MD [facility]   To contact the attending provider between 7A-7P or the covering provider during after hours 7P-7A, please log into the web site [email or web address] and access using universal [place] password for that web site. If you do not have the password, please call the hospital operator.  05/19/2021, 4:47 PM

## 2021-05-20 DIAGNOSIS — I5022 Chronic systolic (congestive) heart failure: Secondary | ICD-10-CM | POA: Diagnosis not present

## 2021-05-20 DIAGNOSIS — N390 Urinary tract infection, site not specified: Secondary | ICD-10-CM | POA: Diagnosis not present

## 2021-05-20 DIAGNOSIS — A419 Sepsis, unspecified organism: Secondary | ICD-10-CM | POA: Diagnosis not present

## 2021-05-20 DIAGNOSIS — E785 Hyperlipidemia, unspecified: Secondary | ICD-10-CM | POA: Diagnosis not present

## 2021-05-20 LAB — GLUCOSE, CAPILLARY
Glucose-Capillary: 247 mg/dL — ABNORMAL HIGH (ref 70–99)
Glucose-Capillary: 340 mg/dL — ABNORMAL HIGH (ref 70–99)
Glucose-Capillary: 342 mg/dL — ABNORMAL HIGH (ref 70–99)
Glucose-Capillary: 335 mg/dL — ABNORMAL HIGH (ref 70–99)

## 2021-05-20 MED ORDER — INSULIN GLARGINE-YFGN 100 UNIT/ML ~~LOC~~ SOLN
25.0000 [IU] | Freq: Every day | SUBCUTANEOUS | Status: DC
  Administered 2021-05-20: 25 [IU] via SUBCUTANEOUS
  Filled 2021-05-20 (×2): qty 0.25

## 2021-05-20 NOTE — Plan of Care (Addendum)
  Problem: Acute Rehab PT Goals(only PT should resolve) Goal: Pt Will Transfer Bed To Chair/Chair To Bed Outcome: Progressing Flowsheets (Taken 05/20/2021 1438) Pt will Transfer Bed to Chair/Chair to Bed: Independently Goal: Pt Will Ambulate Outcome: Progressing Flowsheets (Taken 05/20/2021 1438) Pt will Ambulate:  100 feet  with least restrictive assistive device  with supervision Goal: Pt Will Go Up/Down Stairs Outcome: Progressing Flowsheets (Taken 05/20/2021 1438) Pt will Go Up / Down Stairs:  Flight  with min guard assist  with minimal assist  with rail(s)   John Giovanni, SPT  During this treatment session, the therapist was present, participating in and directing the treatment.  2:58 PM, 05/20/21 Lonell Grandchild, MPT Physical Therapist with [facility] [phone] office [phone] mobile phone

## 2021-05-20 NOTE — TOC Initial Note (Addendum)
Transition of Care [facility]) - Initial/Assessment Note    Patient Details  Name: Patrick Miranda MRN: [id number] Date of Birth: 1945-12-24  [facility] CM/SW Contact:    Boneta Lucks, RN Phone Number: 05/20/2021, 11:19 AM  Clinical Narrative:     Patient admitted with sepsis. Patient lives a home with his wife, [name]. Per [name] he has had multiple falls. He uses a walker and wheelchair. They live on 2nd floor apartment. She states he is to weak it get up the steps. She requested PT eval, MD will order. They are agreeable to SNF, requesting [facility]. Patient has COVID vaccines X4. TOC to follow with PT recommendations.              Addendum : PT is recommending SNF. Sent out to [facility] as requested by wife.  Patient SS#  is incorrect in [place] MUST, wife will provide a card tomorrow or Wednesday to send to [facility] must.   Expected Discharge Plan: Skilled Nursing Facility Barriers to Discharge: Continued Medical Work up  Patient Goals and CMS Choice Patient states their goals for this hospitalization and ongoing recovery are:: to get stronger CMS Medicare.gov Compare Post Acute Care list provided to:: Patient Represenative (must comment) Choice offered to / list presented to : Spouse  Expected Discharge Plan and Services Expected Discharge Plan: [place] arrangements for the past 2 months: Single Family Home                    Prior Living Arrangements/Services Living arrangements for the past 2 months: Single Family Home Lives with:: Spouse Patient language and need for interpreter reviewed:: Yes        Need for Family Participation in Patient Care: Yes (Comment) Care giver support system in place?: Yes (comment) Current home services: DME Criminal Activity/Legal Involvement Pertinent to Current Situation/Hospitalization: No - Comment as needed  Activities of Daily Living      Emotional Assessment      Orientation: : Oriented to  Self, Oriented to Place Alcohol / Substance Use: Not Applicable Psych Involvement: No (comment)  Admission diagnosis:  Lower urinary tract infectious disease [N39.0] Fever [R50.9] Sepsis ([place]) [A41.9] Patient Active Problem List   Diagnosis Date Noted   Sepsis ([place]) 05/18/2021   BPH with obstruction/lower urinary tract symptoms 05/14/2021   Dementia ([place]) 59/56/3875   Umbilical hernia, incarcerated 64/33/2951   Metabolic encephalopathy 88/41/6606   Abdominal pain 08/03/2020   Chronic kidney disease, stage 3a ([place]) 08/03/2020   Mixed diabetic hyperlipidemia associated with type 2 diabetes mellitus ([place]) 08/03/2020   Uncontrolled type 2 diabetes mellitus with hyperglycemia, with long-term current use of insulin ([place]) 08/03/2020   Foodborne gastroenteritis 08/03/2020   SIRS (systemic inflammatory response syndrome) ([place]) 08/03/2020   Postop check 07/25/2020   Hx of CABG 07/09/2020   Coronary artery disease involving native heart 07/04/2020   Abnormal nuclear stress test    Atrial fibrillation ([place]) 12/14/2019   Hypersomnia 08/30/2019   History of adenomatous polyp of colon 05/06/2018   Vasomotor rhinitis 07/18/2017   Arthritis of knee, right 01/06/2014   CVA (cerebral vascular accident) ([place]) 09/07/2011    Class: Acute   Dysarthria 09/05/2011    Class: Acute   Essential hypertension 09/05/2011    Class: Chronic   OSA on CPAP 09/05/2011    Class: Chronic   Anemia 09/05/2011    Class: Chronic   PCP:  Burnard Bunting, MD Pharmacy:  [facility] [phone] - [place], [place] AT [place] [address] [place] [address] Phone: [phone] Fax: [phone]  Readmission Risk Interventions Readmission Risk Prevention Plan 05/20/2021  Transportation Screening Complete  Medication Review (RN Care Manager) Complete  PCP or Specialist appointment within 3-5 days of discharge Not Complete  HRI or Home Care Consult Complete  SW  Recovery Care/Counseling Consult Complete  Palliative Care Screening Not Complete  Comments [place] Not Complete  SNF Comments PT pending  Some recent data might be hidden

## 2021-05-20 NOTE — Progress Notes (Signed)
Inpatient Diabetes Program Recommendations  AACE/ADA: New Consensus Statement on Inpatient Glycemic Control (2015)  Target Ranges:  Prepandial:   less than 140 mg/dL      Peak postprandial:   less than 180 mg/dL (1-2 hours)      Critically ill patients:  140 - 180 mg/dL   Lab Results  Component Value Date   GLUCAP 340 (H) 05/20/2021   HGBA1C 8.8 (H) 05/08/2021    Review of Glycemic Control Results for NAQUAN, GARMAN (MRN [id number]) as of 05/20/2021 12:08  Ref. Range 05/19/2021 11:55 05/19/2021 16:42 05/19/2021 21:24 05/20/2021 07:30 05/20/2021 11:12  Glucose-Capillary Latest Ref Range: 70 - 99 mg/dL 396 (H) 420 (H) 346 (H) 247 (H) 340 (H)   Diabetes history: DM 2 Outpatient Diabetes medications: 0-15 units tid, Metformin 500 mg bid, Toujeo 15 units Daily Current orders for Inpatient glycemic control:  Semglee 22 units Novolog 0-9 units tid + hs  Solucortef 50 mg Q8 hours  Inpatient Diabetes Program Recommendations:   If steroid dose remains the same consider: -  Increasing Semglee to 30 units -  Add Novolog 6 units tid meal coverage if eating >50% of meals  Thanks,  Tama Headings RN, MSN, BC-ADM Inpatient Diabetes Coordinator Team Pager [phone] (8a-5p)

## 2021-05-20 NOTE — Progress Notes (Signed)
Per wife PROGRESS NOTE    Patrick Miranda  NID:[id number] DOB: [birth year] DOA: 05/18/2021 PCP: Burnard Bunting, MD    Chief Complaint  Patient presents with   Fall   Fever    Brief Narrative:  Patrick Miranda is a 75 y.o. male with medical history significant of history of stroke, dementia, hypertension, hyperlipidemia, ischemic cardiomyopathy, type 2 diabetes mellitus, obstructive sleep apnea on CPAP, pulmonary embolism on chronic anticoagulation and history of BPH with recent TURP; who presented to the hospital secondary to increased weakness, falls and fever.  Patient denies chest pain, no nausea, no vomiting, and no hematuria.  There has not been any history of cough, sick contacts or new focal deficits.  Patient history is limited secondary to underlying dementia.   COVID PCR in the ED negative; patient received only 1 dose of Moderna vaccine against COVID in September 2021.   ED Course: Patient had met sepsis criteria on admission with tachycardia, fever, elevated WBCs, soft blood pressure, lactic acid elevation and concern of infection in his urinary tract.  Images are demonstrating no acute cardiopulmonary process.  Cultures have been taken, broad-spectrum antibiotics initiated and fluid resuscitation provided.  TRH has been called to place in the hospital for further evaluation and management.  Assessment & Plan: 1-severe sepsis secondary to Klebsiella pneumonia UTI and bacteremia -recent urologic instrumentation (status post TURP) -Patient met sepsis criteria at time of admission with tachycardia, fever, leukocytosis, soft blood pressure, elevated lactic acid level and having urine as a source of infection.  He was a slightly more confused than at baseline according to patient's wife which would account for organ dysfunction presentation.  Creatinine level was also higher than his baseline. -Continue to follow culture results for sensitivity -Continue current IV  antibiotic -Infectious disease service has been curbside for recommendations about length of therapy and preferred route. -Continue to maintain adequate fluid resuscitation -Lactic acid resolved and within normal limits currently. -Patient is currently afebrile and all vital demonstrating improvement. -Continue the use of antipyretics as needed and follow clinical response. -At this moment will Stop Solu-Cortef. -Physical therapy will be consulted to assess patient mobility capacity.  2-hypertension -Blood pressure stable currently -Continue holding antihypertensive agents at this time -Continue to follow vital signs. -Solu-Cortef will be discontinued.  3-history of dementia -No behavioral disturbances appreciated at time of admission. -Patient was slightly more confused as per wife reports; currently essentially back to his baseline. -Continue the use of Namenda -Continue supportive care.  4-type 2 diabetes mellitus with hyperglycemia -Continue holding oral hypoglycemic agents while inpatient. -Continue sliding scale insulin and Semglee; patient's long-acting insulin dose adjusted for better control. -Continue to follow CBGs and further adjust hypoglycemic regimen as needed. -CBGs were probably higher than his baseline secondary to the use of Solu-Cortef; the medication will be discontinued today.  Continue to follow CBGs.  5-hyperlipidemia -Continue statin -Heart healthy diet has been encouraged.  6-history of BPH status post TURP -Continue the use of Flomax and finasteride -Patient reports no retention symptoms.  7-history of pulmonary embolism -Continue treatment with Eliquis.  8-chronic systolic heart failure -Ejection fraction 45% on most recent echocardiogram in September 2022 -Will plan to resume home heart failure management once blood pressure and sepsis features further stabilized. -Continue to follow daily weights, intake and output, low-sodium diet and judicious  fluid resuscitation.   DVT prophylaxis: Chronically on Eliquis Code Status: Full code. Family Communication: Wife at bedside. Disposition:   Status is: Inpatient   Consultants:  Infectious disease curbside (Dr. Graylon Good).  Procedures:  See below for x-ray reports.  Antimicrobials:  Cefepime   Subjective: No fever, no chest pain, no nausea, no vomiting.  Overall is feeling better.  Expressed weakness and deconditioning; blood pressure has stabilized blood sugar is running high.  Objective: Vitals:   05/20/21 0423 05/20/21 0442 05/20/21 0911 05/20/21 1239  BP:  (!) 130/56 123/60 (!) 142/64  Pulse: 67 (!) 47 63 62  Resp: _0 Temp:  98.1 F (36.7 C) 97.6 F (36.4 C) 97.8 F (36.6 C)  TempSrc:   Oral Oral  SpO2: 98% 97% 97% 96%  Weight:  81.2 kg    Height:  _1  (1.778 m)      Intake/Output Summary (Last 24 hours) at 05/20/2021 1313 Last data filed at 05/20/2021 1231 Gross per 24 hour  Intake 916 ml  Output 1200 ml  Net -284 ml   Filed Weights   05/18/21 1917 05/19/21 0500 05/20/21 0442  Weight: 80.8 kg 81.2 kg 81.2 kg    Examination: General exam: Alert, awake and cooperative with examination; denies chest pain, no nausea, no vomiting, no shortness of breath.  Patient is afebrile. Respiratory system: Clear to auscultation. Respiratory effort normal.  No using accessory muscle.  Good saturation on room air. Cardiovascular system:RRR. No rubs, gallops or JVD on exam Gastrointestinal system: Abdomen is nondistended, soft and nontender. No organomegaly or masses felt. Normal bowel sounds heard. Central nervous system: Alert and oriented. No focal neurological deficits. Extremities: No cyanosis, clubbing or edema. Skin: No petechiae. Psychiatry: Judgement and insight appear impaired secondary to dementia, but at baseline currently.  Mood is a stable.  Data Reviewed: I have personally reviewed following labs and imaging studies  CBC: Recent Labs  Lab  05/15/21 1937 05/18/21 0240  WBC 11.3* 7.0  NEUTROABS 8.4* 6.3  HGB 10.7* 9.9*  HCT 33.2* 30.7*  MCV 92.2 93.3  PLT 363 235    Basic Metabolic Panel: Recent Labs  Lab 05/15/21 1937 05/18/21 0240 05/19/21 0339  NA 135 136 137  K 3.5 3.6 4.0  CL 103 107 111  CO2 25 20* 20*  GLUCOSE 101* 273* 312*  BUN 21 19 24*  CREATININE 1.32* 1.43* 1.34*  CALCIUM 8.3* 8.0* 7.5*    GFR: Estimated Creatinine Clearance: 48.4 mL/min (A) (by C-G formula based on SCr of 1.34 mg/dL (H)).  Liver Function Tests: Recent Labs  Lab 05/15/21 1937 05/18/21 0240  AST 23 33  ALT 8 10  ALKPHOS 42 88  BILITOT 0.3 0.4  PROT 5.2* 5.1*  ALBUMIN 2.4* 2.3*    CBG: Recent Labs  Lab 05/19/21 1155 05/19/21 1642 05/19/21 2124 05/20/21 0730 05/20/21 1112  GLUCAP 396* 420* 346* 247* 340*    Recent Results (from the past 240 hour(s))  Culture, blood (Routine x 2)     Status: None (Preliminary result)   Collection Time: 05/18/21  2:40 AM   Specimen: BLOOD RIGHT ARM  Result Value Ref Range Status   Specimen Description   Final    BLOOD RIGHT ARM Performed at [facility], [address] [place], [place] [address]    Special Requests   Final    BOTTLES DRAWN AEROBIC AND ANAEROBIC Blood Culture adequate volume Performed at [facility], [address], [place], [place] [address]    Culture  Setup Time   Final    GRAM NEGATIVE RODS Gram Stain Report Called to,Read Back By and Verified With:  S.VILLALOBOS @ 0708  05/20/21 BY STEPHTR BLOOD ANAEROBIC BOTTLE Performed at [facility], [address], [place], [place] [address]    Culture GRAM NEGATIVE RODS  Final   Report Status PENDING  Incomplete  Resp Panel by RT-PCR (Flu A&B, Covid) Nasopharyngeal Swab     Status: None   Collection Time: 05/18/21  2:40 AM   Specimen: Nasopharyngeal Swab; Nasopharyngeal(NP) swabs in vial transport medium  Result Value Ref Range Status   SARS Coronavirus 2 by RT PCR NEGATIVE NEGATIVE Final    Comment:  (NOTE) SARS-CoV-2 target nucleic acids are NOT DETECTED.  The SARS-CoV-2 RNA is generally detectable in upper respiratory specimens during the acute phase of infection. The lowest concentration of SARS-CoV-2 viral copies this assay can detect is 138 copies/mL. A negative result does not preclude SARS-Cov-2 infection and should not be used as the sole basis for treatment or other patient management decisions. A negative result may occur with  improper specimen collection/handling, submission of specimen other than nasopharyngeal swab, presence of viral mutation(s) within the areas targeted by this assay, and inadequate number of viral copies(<138 copies/mL). A negative result must be combined with clinical observations, patient history, and epidemiological information. The expected result is Negative.  Fact Sheet for Patients:  EntrepreneurPulse.com.au  Fact Sheet for Healthcare Providers:  IncredibleEmployment.be  This test is no t yet approved or cleared by the [place] FDA and  has been authorized for detection and/or diagnosis of SARS-CoV-2 by FDA under an Emergency Use Authorization (EUA). This EUA will remain  in effect (meaning this test can be used) for the duration of the COVID-19 declaration under Section 564(b)(1) of the Act, 21 U.S.C.section 360bbb-3(b)(1), unless the authorization is terminated  or revoked sooner.       Influenza A by PCR NEGATIVE NEGATIVE Final   Influenza B by PCR NEGATIVE NEGATIVE Final    Comment: (NOTE) The Xpert Xpress SARS-CoV-2/FLU/RSV plus assay is intended as an aid in the diagnosis of influenza from Nasopharyngeal swab specimens and should not be used as a sole basis for treatment. Nasal washings and aspirates are unacceptable for Xpert Xpress SARS-CoV-2/FLU/RSV testing.  Fact Sheet for Patients: EntrepreneurPulse.com.au  Fact Sheet for Healthcare  Providers: IncredibleEmployment.be  This test is not yet approved or cleared by the [place] FDA and has been authorized for detection and/or diagnosis of SARS-CoV-2 by FDA under an Emergency Use Authorization (EUA). This EUA will remain in effect (meaning this test can be used) for the duration of the COVID-19 declaration under Section 564(b)(1) of the Act, 21 U.S.C. section 360bbb-3(b)(1), unless the authorization is terminated or revoked.  Performed at [facility], [address], [place], [place] [address]   Culture, blood (Routine x 2)     Status: Abnormal (Preliminary result)   Collection Time: 05/18/21  3:17 AM   Specimen: BLOOD RIGHT FOREARM  Result Value Ref Range Status   Specimen Description   Final    BLOOD RIGHT FOREARM Performed at [facility], [address] [place], [place] [address]    Special Requests   Final    BOTTLES DRAWN AEROBIC AND ANAEROBIC Blood Culture adequate volume Performed at [facility], [address] [place], [place] [address]    Culture  Setup Time   Final    ANAEROBIC BOTTLE GRAM POSITIVE COCCI CRITICAL RESULT CALLED TO, READ BACK BY AND VERIFIED WITH: COE,H 1809 05/18/2021 CO[place],R GNR SEEN ON GRAM STAIN, REPORTED RESULT TO L HENDERSON _0  05/19/21 [place] GRAM NEGATIVE RODS AEROBIC BOTTLE ONLY  Culture (A)  Final    KLEBSIELLA PNEUMONIAE SUSCEPTIBILITIES TO FOLLOW Performed at [facility], [place] [address], [place], [place] [address]    Report Status PENDING  Incomplete  Blood Culture ID Panel (Reflexed)     Status: Abnormal   Collection Time: 05/18/21  3:17 AM  Result Value Ref Range Status   Enterococcus faecalis NOT DETECTED NOT DETECTED Final   Enterococcus Faecium NOT DETECTED NOT DETECTED Final   Listeria monocytogenes NOT DETECTED NOT DETECTED Final   Staphylococcus species NOT DETECTED NOT DETECTED Final   Staphylococcus aureus (BCID) NOT DETECTED NOT DETECTED Final   Staphylococcus epidermidis  NOT DETECTED NOT DETECTED Final   Staphylococcus lugdunensis NOT DETECTED NOT DETECTED Final   Streptococcus species NOT DETECTED NOT DETECTED Final   Streptococcus agalactiae NOT DETECTED NOT DETECTED Final   Streptococcus pneumoniae NOT DETECTED NOT DETECTED Final   Streptococcus pyogenes NOT DETECTED NOT DETECTED Final   A.calcoaceticus-baumannii NOT DETECTED NOT DETECTED Final   Bacteroides fragilis NOT DETECTED NOT DETECTED Final   Enterobacterales DETECTED (A) NOT DETECTED Final    Comment: Enterobacterales represent a large order of gram negative bacteria, not a single organism. CRITICAL RESULT CALLED TO, READ BACK BY AND VERIFIED WITH: REPORTED RESULT TO L HENDERSON _0  05/19/21 [place]    Enterobacter cloacae complex NOT DETECTED NOT DETECTED Final   Escherichia coli NOT DETECTED NOT DETECTED Final   Klebsiella aerogenes NOT DETECTED NOT DETECTED Final   Klebsiella oxytoca NOT DETECTED NOT DETECTED Final   Klebsiella pneumoniae DETECTED (A) NOT DETECTED Final    Comment: CRITICAL RESULT CALLED TO, READ BACK BY AND VERIFIED WITH: REPORTED RESULT TO L HENDERSON _1  05/19/21 [place]    Proteus species NOT DETECTED NOT DETECTED Final   Salmonella species NOT DETECTED NOT DETECTED Final   Serratia marcescens NOT DETECTED NOT DETECTED Final   Haemophilus influenzae NOT DETECTED NOT DETECTED Final   Neisseria meningitidis NOT DETECTED NOT DETECTED Final   Pseudomonas aeruginosa NOT DETECTED NOT DETECTED Final   Stenotrophomonas maltophilia NOT DETECTED NOT DETECTED Final   Candida albicans NOT DETECTED NOT DETECTED Final   Candida auris NOT DETECTED NOT DETECTED Final   Candida glabrata NOT DETECTED NOT DETECTED Final   Candida krusei NOT DETECTED NOT DETECTED Final   Candida parapsilosis NOT DETECTED NOT DETECTED Final   Candida tropicalis NOT DETECTED NOT DETECTED Final   Cryptococcus neoformans/gattii NOT DETECTED NOT DETECTED Final   CTX-M ESBL NOT DETECTED NOT DETECTED Final    Carbapenem resistance IMP NOT DETECTED NOT DETECTED Final   Carbapenem resistance KPC NOT DETECTED NOT DETECTED Final   Carbapenem resistance NDM NOT DETECTED NOT DETECTED Final   Carbapenem resist OXA 48 LIKE NOT DETECTED NOT DETECTED Final   Carbapenem resistance VIM NOT DETECTED NOT DETECTED Final    Comment: Performed at [facility], [address], [place], [place] [address]  MRSA Next Gen by PCR, Nasal     Status: None   Collection Time: 05/18/21  7:22 PM   Specimen: Nasal Mucosa; Nasal Swab  Result Value Ref Range Status   MRSA by PCR Next Gen NOT DETECTED NOT DETECTED Final    Comment: (NOTE) The GeneXpert MRSA Assay (FDA approved for NASAL specimens only), is one component of a comprehensive MRSA colonization surveillance program. It is not intended to diagnose MRSA infection nor to guide or monitor treatment for MRSA infections. Test performance is not FDA approved in patients less than 21 years old. Performed at [facility], [address], [place], [place] [address]      Radiology Studies: No results found.  Scheduled Meds:  apixaban  5 mg Oral BID   aspirin EC  81 mg Oral Daily   chlorhexidine  15 mL Mouth Rinse BID   Chlorhexidine Gluconate Cloth  6 each Topical Daily   finasteride  5 mg Oral QHS   insulin aspart  0-5 Units Subcutaneous QHS   insulin aspart  0-9 Units Subcutaneous TID WC   insulin glargine-yfgn  22 Units Subcutaneous QHS   mouth rinse  15 mL Mouth Rinse q12n4p   memantine  10 mg Oral BID   rosuvastatin  20 mg Oral Daily   saccharomyces boulardii  250 mg Oral BID   tamsulosin  0.4 mg Oral QHS   Continuous Infusions:  ceFEPime (MAXIPIME) IV 2 g (05/20/21 0943)     LOS: 2 days    Time spent: 35 minutes   Barton Dubois, MD [facility]   To contact the attending provider between 7A-7P or the covering provider during after hours 7P-7A, please log into the web site [email or web address] and access using universal [place] password  for that web site. If you do not have the password, please call the hospital operator.  05/20/2021, 1:13 PM

## 2021-05-20 NOTE — NC FL2 (Signed)
[place] MEDICAID FL2 LEVEL OF CARE SCREENING TOOL     IDENTIFICATION  Patient Name: Patrick Miranda Birthdate: 08-Sep-1945 Sex: male Admission Date (Current Location): 05/18/2021  [facility] and [place] Number:  [employer] and Address:  [place] [address], [place]      Provider Number: [id number]  Attending Physician Name and Address:  Barton Dubois, MD  Relative Name and Phone Number:  [place] - wife  [phone]    Current Level of Care: Hospital Recommended Level of Care: [place] Prior Approval Number:    Date Approved/Denied:   PASRR Number: [id number]  Discharge Plan: SNF    Current Diagnoses: Patient Active Problem List   Diagnosis Date Noted   Sepsis ([place]) 05/18/2021   BPH with obstruction/lower urinary tract symptoms 05/14/2021   Dementia ([place]) 08/65/7846   Umbilical hernia, incarcerated 96/29/5284   Metabolic encephalopathy 13/24/4010   Abdominal pain 08/03/2020   Chronic kidney disease, stage 3a ([place]) 08/03/2020   Mixed diabetic hyperlipidemia associated with type 2 diabetes mellitus ([place]) 08/03/2020   Uncontrolled type 2 diabetes mellitus with hyperglycemia, with long-term current use of insulin ([place]) 08/03/2020   Foodborne gastroenteritis 08/03/2020   SIRS (systemic inflammatory response syndrome) ([place]) 08/03/2020   Postop check 07/25/2020   Hx of CABG 07/09/2020   Coronary artery disease involving native heart 07/04/2020   Abnormal nuclear stress test    Atrial fibrillation ([place]) 12/14/2019   Hypersomnia 08/30/2019   History of adenomatous polyp of colon 05/06/2018   Vasomotor rhinitis 07/18/2017   Arthritis of knee, right 01/06/2014   CVA (cerebral vascular accident) ([place]) 09/07/2011   Dysarthria 09/05/2011   Essential hypertension 09/05/2011   OSA on CPAP 09/05/2011   Anemia 09/05/2011    Orientation RESPIRATION BLADDER Height & Weight     Self, Place, Situation   Normal External catheter Weight: 81.2 kg Height:  5\' 10"  (177.8 cm)  BEHAVIORAL SYMPTOMS/MOOD NEUROLOGICAL BOWEL NUTRITION STATUS      Continent Diet (see DC summary)  AMBULATORY STATUS COMMUNICATION OF NEEDS Skin   Extensive Assist Verbally Normal                       Personal Care Assistance Level of Assistance  Bathing, Dressing, Feeding Bathing Assistance: Limited assistance Feeding assistance: Independent Dressing Assistance: Maximum assistance     Functional Limitations Info  Sight, Hearing, Speech Sight Info: Adequate Hearing Info: Impaired Speech Info: Adequate    SPECIAL CARE FACTORS FREQUENCY  PT (By licensed PT)     PT Frequency: 5 Times a week              Contractures Contractures Info: Not present    Additional Factors Info  Code Status, Allergies Code Status Info: FUL Allergies Info: Lipitor, sitagliptin           Current Medications (05/20/2021):  This is the current hospital active medication list Current Facility-Administered Medications  Medication Dose Route Frequency Provider Last Rate Last Admin   acetaminophen (TYLENOL) tablet 650 mg  650 mg Oral Q6H PRN Barton Dubois, MD   650 mg at 05/18/21 1127   apixaban (ELIQUIS) tablet 5 mg  5 mg Oral BID Barton Dubois, MD   5 mg at 05/20/21 2725   aspirin EC tablet 81 mg  81 mg Oral Daily Barton Dubois, MD   81 mg at 05/20/21 0917   ceFEPIme (MAXIPIME) 2 g in sodium chloride 0.9 % 100 mL IVPB  2 g Intravenous Q12H Coffee, Donna Christen, RPH 200 mL/hr at 05/20/21 0943 2 g at 05/20/21 0943   chlorhexidine (PERIDEX) 0.12 % solution 15 mL  15 mL Mouth Rinse BID Zierle-Ghosh, Asia B, DO   15 mL at 05/20/21 6962   Chlorhexidine Gluconate Cloth 2 % PADS 6 each  6 each Topical Daily Zierle-Ghosh, Asia B, DO   6 each at 05/20/21 1231   finasteride (PROSCAR) tablet 5 mg  5 mg Oral QHS Barton Dubois, MD   5 mg at 05/19/21 2300   insulin aspart (novoLOG) injection 0-5 Units  0-5 Units Subcutaneous QHS Barton Dubois, MD   4 Units at 05/19/21 2245   insulin aspart (novoLOG) injection 0-9 Units  0-9 Units Subcutaneous TID WC Barton Dubois, MD   7 Units at 05/20/21 1230   insulin glargine-yfgn (SEMGLEE) injection 25 Units  25 Units Subcutaneous QHS Barton Dubois, MD       MEDLINE mouth rinse  15 mL Mouth Rinse q12n4p Zierle-Ghosh, Asia B, DO   15 mL at 05/19/21 1500   memantine (NAMENDA) tablet 10 mg  10 mg Oral BID Barton Dubois, MD   10 mg at 05/20/21 0917   ondansetron (ZOFRAN) tablet 4 mg  4 mg Oral Q6H PRN Barton Dubois, MD       Or   ondansetron [facility]) injection 4 mg  4 mg Intravenous Q6H PRN Barton Dubois, MD       polyvinyl alcohol (LIQUIFILM TEARS) 1.4 % ophthalmic solution 1 drop  1 drop Both Eyes PRN Barton Dubois, MD   1 drop at 05/19/21 1359   rosuvastatin (CRESTOR) tablet 20 mg  20 mg Oral Daily Barton Dubois, MD   20 mg at 05/20/21 9528   saccharomyces boulardii (FLORASTOR) capsule 250 mg  250 mg Oral BID Barton Dubois, MD   250 mg at 05/20/21 4132   tamsulosin (FLOMAX) capsule 0.4 mg  0.4 mg Oral QHS Barton Dubois, MD   0.4 mg at 05/19/21 2300   traMADol (ULTRAM) tablet 50 mg  50 mg Oral Q6H PRN Barton Dubois, MD         Discharge Medications: Please see discharge summary for a list of discharge medications.  Relevant Imaging Results:  Relevant Lab Results:   Additional Information SS# [id number]  Boneta Lucks, RN

## 2021-05-20 NOTE — Evaluation (Addendum)
Physical Therapy Evaluation Patient Details Name: Patrick Miranda MRN: [id number] DOB: March 07, 1946 Today's Date: 05/20/2021  History of Present Illness  Patrick Miranda is a 75 y.o. male with medical history significant of history of stroke, dementia, hypertension, hyperlipidemia, ischemic cardiomyopathy, type 2 diabetes mellitus, obstructive sleep apnea on CPAP, pulmonary embolism on chronic anticoagulation and history of BPH with recent TURP; who presented to the hospital secondary to increased weakness, falls and fever.  Patient denies chest pain, no nausea, no vomiting, and no hematuria.  There has not been any history of cough, sick contacts or new focal deficits.  Patient history is limited secondary to underlying dementia.   Clinical Impression  Patient presents in bed awake, alert, and agreeable for therapy w/ wife at bedside. Patient transitioned from supine to sitting EOB w/ increased time, able to sit at EOB w/ good balance w/ B feet supported. Patient demonstrated the ability to put socks on B LE seated at EOB w/ Min guard. Patient demonstrated a slow labored steady cadence using a RW when ambulating in the hallway and limited mostly due to fatigue.  Patient will benefit from continued physical therapy in hospital and recommended venue below to increase strength, balance, endurance for safe ADLs and gait.    Recommendations for follow up therapy are one component of a multi-disciplinary discharge planning process, led by the attending physician.  Recommendations may be updated based on patient status, additional functional criteria and insurance authorization.  Follow Up Recommendations Skilled nursing-short term rehab (<3 hours/day)    Assistance Recommended at Discharge Frequent or constant Supervision/Assistance  Functional Status Assessment    Equipment Recommendations       Recommendations for Other Services       Precautions / Restrictions Precautions Precautions:  Fall Restrictions Weight Bearing Restrictions: No      Mobility  Bed Mobility Overal bed mobility: Modified Independent             General bed mobility comments: increased time, HOB elevated d/t HOB at home being raised    Transfers Overall transfer level: Modified independent Equipment used: Rolling walker (2 wheels)               General transfer comment: Able to transfer from bed to chair w/out AD but slightly unsteady on feet, increased time, safer using RW    Ambulation/Gait Ambulation/Gait assistance: Supervision;Min guard Gait Distance (Feet): 60 Feet Assistive device: Rolling walker (2 wheels) Gait Pattern/deviations: Step-through pattern;Decreased step length - right;Decreased step length - left;Decreased stride length Gait velocity: Decreased   General Gait Details: slow cadence, steady using RW, reported no fatigue  Stairs            Wheelchair Mobility    Modified Rankin (Stroke Patients Only)       Balance Overall balance assessment: Needs assistance Sitting-balance support: No upper extremity supported;Feet supported Sitting balance-Leahy Scale: Good Sitting balance - Comments: at EOB   Standing balance support: Bilateral upper extremity supported;During functional activity Standing balance-Leahy Scale: Fair Standing balance comment: fair/good using RW                             Pertinent Vitals/Pain Pain Assessment: No/denies pain    Home Living Family/patient expects to be discharged to:: Private residence Living Arrangements: Spouse/significant other Available Help at Discharge: Family;Available 24 hours/day Type of Home: Apartment Home Access: Stairs to enter Entrance Stairs-Rails: Right;Left;Can reach both Entrance Stairs-Number of Steps: 15  Home Layout: One level Home Equipment: Rollator (4 wheels);Cane - single point;BSC;Tub bench;Wheelchair - manual;Grab bars - tub/shower;Other (comment) (HOB raises  and lowers)      Prior Function Prior Level of Function : Needs assist             Mobility Comments: ambulates household distances w/out AD, uses SPC or rollator for ambulating community distances ADLs Comments: assisted by wife as needed, does not drive     Hand Dominance        Extremity/Trunk Assessment   Upper Extremity Assessment Upper Extremity Assessment: Overall WFL for tasks assessed    Lower Extremity Assessment Lower Extremity Assessment: Generalized weakness       Communication   Communication: No difficulties  Cognition Arousal/Alertness: Awake/alert Behavior During Therapy: WFL for tasks assessed/performed Overall Cognitive Status: History of cognitive impairments - at baseline                                 General Comments: Dementia        General Comments      Exercises     Assessment/Plan    PT Assessment Patient needs continued PT services  PT Problem List Decreased strength;Decreased mobility;Decreased safety awareness;Decreased range of motion;Decreased activity tolerance;Decreased coordination;Decreased balance;Decreased knowledge of use of DME       PT Treatment Interventions DME instruction;Therapeutic exercise;Gait training;Balance training;Stair training;Functional mobility training;Therapeutic activities;Patient/family education    PT Goals (Current goals can be found in the Care Plan section)  Acute Rehab PT Goals Patient Stated Goal: Return home w/ wife PT Goal Formulation: With patient/family Time For Goal Achievement: 06/03/21 Potential to Achieve Goals: Good    Frequency Min 3X/week   Barriers to discharge        Co-evaluation               AM-PAC PT "6 Clicks" Mobility  Outcome Measure Help needed turning from your back to your side while in a flat bed without using bedrails?: None Help needed moving from lying on your back to sitting on the side of a flat bed without using bedrails?:  None Help needed moving to and from a bed to a chair (including a wheelchair)?: A Little Help needed standing up from a chair using your arms (e.g., wheelchair or bedside chair)?: None Help needed to walk in hospital room?: A Little Help needed climbing 3-5 steps with a railing? : A Lot 6 Click Score: 20    End of Session   Activity Tolerance: Patient tolerated treatment well Patient left: in chair;with call bell/phone within reach;with family/visitor present Nurse Communication: Mobility status PT Visit Diagnosis: Unsteadiness on feet (R26.81);Other abnormalities of gait and mobility (R26.89);Muscle weakness (generalized) (M62.81)    Time: 1340-1404 PT Time Calculation (min) (ACUTE ONLY): 24 min   Charges:   PT Evaluation $PT Eval Moderate Complexity: 1 Mod PT Treatments $Therapeutic Activity: 23-37 mins        Cassie Jones, SPT  During this treatment session, the therapist was present, participating in and directing the treatment.  2:56 PM, 05/20/21 Lonell Grandchild, MPT Physical Therapist with [facility] [phone] office [phone] mobile phone

## 2021-05-20 NOTE — Progress Notes (Signed)
Gram negative rods from 10/29. MD made aware. No new orders at this time.

## 2021-05-21 DIAGNOSIS — N390 Urinary tract infection, site not specified: Secondary | ICD-10-CM | POA: Diagnosis not present

## 2021-05-21 DIAGNOSIS — Z794 Long term (current) use of insulin: Secondary | ICD-10-CM

## 2021-05-21 DIAGNOSIS — I5022 Chronic systolic (congestive) heart failure: Secondary | ICD-10-CM | POA: Diagnosis not present

## 2021-05-21 DIAGNOSIS — I10 Essential (primary) hypertension: Secondary | ICD-10-CM

## 2021-05-21 DIAGNOSIS — A419 Sepsis, unspecified organism: Secondary | ICD-10-CM | POA: Diagnosis not present

## 2021-05-21 DIAGNOSIS — E1165 Type 2 diabetes mellitus with hyperglycemia: Secondary | ICD-10-CM | POA: Diagnosis not present

## 2021-05-21 LAB — GLUCOSE, CAPILLARY
Glucose-Capillary: 180 mg/dL — ABNORMAL HIGH (ref 70–99)
Glucose-Capillary: 267 mg/dL — ABNORMAL HIGH (ref 70–99)
Glucose-Capillary: 183 mg/dL — ABNORMAL HIGH (ref 70–99)

## 2021-05-21 LAB — BASIC METABOLIC PANEL
Anion gap: 4 — ABNORMAL LOW (ref 5–15)
BUN: 29 mg/dL — ABNORMAL HIGH (ref 8–23)
CO2: 24 mmol/L (ref 22–32)
Calcium: 7.6 mg/dL — ABNORMAL LOW (ref 8.9–10.3)
Chloride: 110 mmol/L (ref 98–111)
Creatinine, Ser: 1.3 mg/dL — ABNORMAL HIGH (ref 0.61–1.24)
GFR, Estimated: 57 mL/min — ABNORMAL LOW (ref >60)
Glucose, Bld: 218 mg/dL — ABNORMAL HIGH (ref 70–99)
Potassium: 3.5 mmol/L (ref 3.5–5.1)
Sodium: 138 mmol/L (ref 135–145)

## 2021-05-21 LAB — CULTURE, BLOOD (ROUTINE X 2): Special Requests: ADEQUATE

## 2021-05-21 MED ORDER — CEFDINIR 300 MG PO CAPS: 300.0000 mg | ORAL_CAPSULE | Freq: Two times a day (BID) | ORAL | 0 refills | Status: AC

## 2021-05-21 MED ORDER — METOPROLOL SUCCINATE ER 25 MG PO TB24: 12.5000 mg | ORAL_TABLET | Freq: Every day | ORAL | Status: DC

## 2021-05-21 MED ORDER — TOUJEO SOLOSTAR 300 UNIT/ML ~~LOC~~ SOPN: 15.0000 [IU] | PEN_INJECTOR | Freq: Every day | SUBCUTANEOUS | Status: DC

## 2021-05-21 NOTE — Discharge Summary (Signed)
Physician Discharge Summary  Gonzales CLE:[id number] DOB: [birth year] DOA: 05/18/2021  PCP: Burnard Bunting, MD  Admit date: 05/18/2021 Discharge date: 05/21/2021  Time spent: 35 minutes  Recommendations for Outpatient Follow-up:  Repeat basic metabolic panel to follow electrolytes and renal function Reassess complete resolution of patient's symptoms after completion of antibiotic therapy. Reassess blood pressure and adjust antihypertensive regimen as needed Continue to follow patient CBGs/A1c with further adjustment to hypoglycemia regimen as needed.  Discharge Diagnoses:  Active Problems:   Primary hypertension    Type 2 diabetes mellitus with hyperglycemia, with long-term current use of insulin (HCC)   Sepsis (HCC)   Lower urinary tract infectious disease   Chronic systolic HF (heart failure) ([place])   Discharge Condition: Stable and improved.  Discharged home with instruction to follow-up with PCP, urology and cardiology service as an outpatient.  CODE STATUS: Full code.  Diet recommendation: Heart healthy and modified carbohydrate diet.  Filed Weights   05/18/21 1917 05/19/21 0500 05/20/21 0442  Weight: 80.8 kg 81.2 kg 81.2 kg    History of present illness:  Patrick Miranda is a 75 y.o. male with medical history significant of history of stroke, dementia, hypertension, hyperlipidemia, ischemic cardiomyopathy, type 2 diabetes mellitus, obstructive sleep apnea on CPAP, pulmonary embolism on chronic anticoagulation and history of BPH with recent TURP; who presented to the hospital secondary to increased weakness, falls and fever.  Patient denies chest pain, no nausea, no vomiting, and no hematuria.  There has not been any history of cough, sick contacts or new focal deficits.  Patient history is limited secondary to underlying dementia.   COVID PCR in the ED negative; patient received only 1 dose of Moderna vaccine against COVID in September 2021.   ED Course:  Patient had met sepsis criteria on admission with tachycardia, fever, elevated WBCs, soft blood pressure, lactic acid elevation and concern of infection in his urinary tract.  Images are demonstrating no acute cardiopulmonary process.  Cultures have been taken, broad-spectrum antibiotics initiated and fluid resuscitation provided.  TRH has been called to place in the hospital for further evaluation and management.  Hospital Course:  1-severe sepsis secondary to Klebsiella pneumonia UTI and bacteremia -recent urologic instrumentation (status post TURP) -Patient met sepsis criteria at time of admission with tachycardia, fever, leukocytosis, soft blood pressure, elevated lactic acid level and having urine as a source of infection.  He was a slightly more confused than at baseline according to patient's wife which would account for organ dysfunction presentation.  Creatinine level was also slightly higher than his baseline (back to baseline for him at discharge). -Klebsiella pneumonia has been isolated on patient's cultures results with sensitivity demonstrating just resistance to ampicillin; Otherwise pansensitive. -Infectious disease service has been curbside for recommendations about length of therapy and recommendations given to follow cefdinir 500 mg twice a day for 8 more days. -Lactic acid resolved and within normal limits currently. -Patient is currently afebrile and all sepsis features resolved. -Continue the use of antipyretics as needed and continue maintaining adequate hydration. -Physical therapy has seen patient with recommendation for home health PT/OT.   2-hypertension -Blood pressure stable and rising -Start resuming home antihypertensive regimen -Heart healthy diet has been encouraged -Outpatient follow-up with PCP recommended.   3-history of dementia -No behavioral disturbances appreciated at time of admission. -Patient was slightly more confused as per wife reports; currently  back to his baseline. -Continue the use of Namenda -Continue supportive care.   4-type 2 diabetes mellitus with  hyperglycemia -CBGs were running slightly high during hospitalization in the setting of acute infection along with the use of Solu-Cortef. -Trending down appropriately at discharge -Will resume home hypoglycemic regimen, advised to follow modified carbohydrate diet and outpatient follow-up with PCP to further adjust diabetic management as required.   5-hyperlipidemia -Continue statin -Heart healthy diet has been encouraged.   6-history of BPH status post TURP -Continue the use of Flomax and finasteride -Patient reports no retention symptoms. -Continue outpatient follow-up with urology service.   7-history of pulmonary embolism -Continue treatment with Eliquis.   8-chronic systolic heart failure -Ejection fraction 45% on most recent echocardiogram in September 2022 -Recent home heart failure management medications -Continue to follow daily weights and low-sodium diet -Patient advised to maintain adequate hydration -Outpatient follow-up with cardiology.    Procedures: See below for x-ray reports.  Consultations: ID (Dr. Graylon Good).  Discharge Exam: Vitals:   05/21/21 0905 05/21/21 1402  BP: (!) 105/50 126/62  Pulse: (!) 59 63  Resp:  18  Temp:  98.2 F (36.8 C)  SpO2:  98%    General: No fever, no chest pain, no nausea, no vomiting.  Patient reports no palpitations.  Feeling significantly better and ready to go home. Cardiovascular: Rate controlled, mild intermittent episodes of bradycardia appreciated on telemetry overnight; no rubs, no gallops, no JVD. Respiratory: Good air movement bilaterally, no using accessory muscles, good saturation on room air.  No accessory muscles use. Abdomen: Soft, nontender, nondistended, positive bowel sounds Extremities: No cyanosis or clubbing.  Discharge Instructions   Discharge Instructions     (HEART FAILURE PATIENTS)  Call MD:  Anytime you have any of the following symptoms: 1) 3 pound weight gain in 24 hours or 5 pounds in 1 week 2) shortness of breath, with or without a dry hacking cough 3) swelling in the hands, feet or stomach 4) if you have to sleep on extra pillows at night in order to breathe.   Complete by: As directed    Diet - low sodium heart healthy   Complete by: As directed    Discharge instructions   Complete by: As directed    Take medications as prescribed Maintain adequate hydration Follow heart healthy/low-sodium diet and check your weight on daily basis. Arrange follow-up with PCP in 10 days Follow recommendation and instruction by home health services.      Allergies as of 05/21/2021       Reactions   Lipitor [atorvastatin] Other (See Comments)   Muscle pain   Sitagliptin    Myalgias        Medication List     STOP taking these medications    enoxaparin 120 MG/0.8ML injection Commonly known as: LOVENOX   spironolactone 25 MG tablet Commonly known as: ALDACTONE       TAKE these medications    acetaminophen 325 MG tablet Commonly known as: TYLENOL Take 2 tablets (650 mg total) by mouth every 6 (six) hours as needed for mild pain or headache (or Fever >/= 101).   apixaban 5 MG Tabs tablet Commonly known as: Eliquis Take 1 tablet (5 mg total) by mouth 2 (two) times daily.   aspirin 81 MG EC tablet Take 1 tablet (81 mg total) by mouth daily. Swallow whole.   B-12 5000 MCG Subl Place 5,000 mcg under the tongue daily.   BD Pen Needle Nano U/F 32G X 4 MM Misc Generic drug: Insulin Pen Needle 1 each by Other route daily.   Biotin 5000 MCG Tabs  Take 5,000 mcg by mouth daily.   budesonide 3 MG 24 hr capsule Commonly known as: ENTOCORT EC Take 9 mg by mouth daily.   cefdinir 300 MG capsule Commonly known as: OMNICEF Take 1 capsule (300 mg total) by mouth 2 (two) times daily for 8 days.   CULTURELLE PO Take 1 capsule by mouth daily.   fenofibrate 160  MG tablet Take 160 mg by mouth daily.   finasteride 5 MG tablet Commonly known as: PROSCAR Take 5 mg by mouth at bedtime.   FreeStyle [employer] 2 Sensor Misc See admin instructions.   losartan 25 MG tablet Commonly known as: COZAAR Take 0.5 tablets (12.5 mg total) by mouth daily. What changed: when to take this   memantine 10 MG tablet Commonly known as: NAMENDA TAKE 1 TABLET(10 MG) BY MOUTH TWICE DAILY   metFORMIN 500 MG tablet Commonly known as: GLUCOPHAGE Take 500 mg by mouth 2 (two) times daily with a meal.   metoprolol succinate 25 MG 24 hr tablet Commonly known as: TOPROL-XL Take 0.5 tablets (12.5 mg total) by mouth daily. What changed: See the new instructions.   multivitamin with minerals Tabs tablet Take 1 tablet by mouth daily.   NOVOLOG FLEXPEN [place] Inject 0-15 Units into the skin 3 (three) times daily before meals. Dose per sliding scale   OneTouch Verio test strip Generic drug: glucose blood 1 each by Other route in the morning, at noon, and at bedtime.   rosuvastatin 20 MG tablet Commonly known as: CRESTOR TAKE 1 TABLET(20 MG) BY MOUTH DAILY What changed: See the new instructions.   tamsulosin 0.4 MG Caps capsule Commonly known as: FLOMAX Take 1 capsule (0.4 mg total) by mouth daily. What changed: when to take this   Toujeo SoloStar 300 UNIT/ML Solostar Pen Generic drug: insulin glargine (1 Unit Dial) Inject 15 Units into the skin daily.   traMADol 50 MG tablet Commonly known as: Ultram Take 1 tablet (50 mg total) by mouth every 6 (six) hours as needed.       Allergies  Allergen Reactions   Lipitor [Atorvastatin] Other (See Comments)    Muscle pain   Sitagliptin     Myalgias    Contact information for follow-up providers     [facility] Follow up.   Specialty: Home Health Services Why: PT will call to schedule your first home visit. Contact information: [address] [place] [address] [phone]          Burnard Bunting, MD. Schedule an appointment as soon as possible for a visit in 10 day(s).   Specialty: Internal Medicine Contact information: [place] [address] [phone]         Satira Sark, MD .   Specialty: Cardiology Contact information: [place] [address] [phone]              Contact information for after-discharge care     [facility] .   Service: Skilled Nursing Contact information: [address] [place] [phone]                     The results of significant diagnostics from this hospitalization (including imaging, microbiology, ancillary and laboratory) are listed below for reference.    Significant Diagnostic Studies: CT Head Wo Contrast  Result Date: 05/15/2021 CLINICAL DATA:  Weakness unable to walk EXAM: CT HEAD WITHOUT CONTRAST  CT CERVICAL SPINE WITHOUT CONTRAST TECHNIQUE: Multidetector CT imaging of the head and cervical spine was performed following the standard protocol without intravenous contrast. Multiplanar CT image reconstructions of the cervical spine were also generated. COMPARISON:  CT brain and cervical spine 02/17/2021 FINDINGS: CT HEAD FINDINGS Brain: No acute territorial infarction, hemorrhage or intracranial mass. Multiple chronic infarcts with encephalomalacia in the left frontal lobe, right parietal lobe, right occipital lobe and right posterior temporal lobes. Chronic lacunar infarcts within the pons and left thalamus. Mild atrophy. Moderate chronic small vessel ischemic changes of the white matter. Vascular: No hyperdense vessels.  Carotid vascular calcification Skull: Normal. Negative for fracture or focal lesion. Sinuses/Orbits: No acute finding. Other: None CT CERVICAL SPINE FINDINGS Alignment: No subluxation.  Facet alignment within normal limits. Skull base and vertebrae: No acute fracture. No  primary bone lesion or focal pathologic process. Soft tissues and spinal canal: No prevertebral fluid or swelling. No visible canal hematoma. Disc levels: Multilevel degenerative change. Moderate severe disc space narrowing C5-C6 and C6-C7. Facet degenerative change at multiple levels with foraminal stenosis Upper chest: Lung apices are clear. 1.3 cm hypodense nodule left lobe of thyroid, no further workup recommended based on size of lesion and age of patient Other: Negative IMPRESSION: 1. No CT evidence for acute intracranial abnormality. Atrophy and chronic small vessel ischemic changes of the white matter. Multiple chronic infarcts. 2. Degenerative changes of the cervical spine. No acute osseous abnormality. Electronically Signed   By: Donavan Foil M.D.   On: 05/15/2021 19:41   CT Cervical Spine Wo Contrast  Result Date: 05/15/2021 CLINICAL DATA:  Weakness unable to walk EXAM: CT HEAD WITHOUT CONTRAST CT CERVICAL SPINE WITHOUT CONTRAST TECHNIQUE: Multidetector CT imaging of the head and cervical spine was performed following the standard protocol without intravenous contrast. Multiplanar CT image reconstructions of the cervical spine were also generated. COMPARISON:  CT brain and cervical spine 02/17/2021 FINDINGS: CT HEAD FINDINGS Brain: No acute territorial infarction, hemorrhage or intracranial mass. Multiple chronic infarcts with encephalomalacia in the left frontal lobe, right parietal lobe, right occipital lobe and right posterior temporal lobes. Chronic lacunar infarcts within the pons and left thalamus. Mild atrophy. Moderate chronic small vessel ischemic changes of the white matter. Vascular: No hyperdense vessels.  Carotid vascular calcification Skull: Normal. Negative for fracture or focal lesion. Sinuses/Orbits: No acute finding. Other: None CT CERVICAL SPINE FINDINGS Alignment: No subluxation.  Facet alignment within normal limits. Skull base and vertebrae: No acute fracture. No primary bone  lesion or focal pathologic process. Soft tissues and spinal canal: No prevertebral fluid or swelling. No visible canal hematoma. Disc levels: Multilevel degenerative change. Moderate severe disc space narrowing C5-C6 and C6-C7. Facet degenerative change at multiple levels with foraminal stenosis Upper chest: Lung apices are clear. 1.3 cm hypodense nodule left lobe of thyroid, no further workup recommended based on size of lesion and age of patient Other: Negative IMPRESSION: 1. No CT evidence for acute intracranial abnormality. Atrophy and chronic small vessel ischemic changes of the white matter. Multiple chronic infarcts. 2. Degenerative changes of the cervical spine. No acute osseous abnormality. Electronically Signed   By: Donavan Foil M.D.   On: 05/15/2021 19:41   DG Chest Port 1 View  Result Date: 05/18/2021 CLINICAL DATA:  Fever, sepsis EXAM: PORTABLE CHEST 1 VIEW COMPARISON:  10/03/2020 FINDINGS: Lungs are well expanded, symmetric, and clear. No pneumothorax or pleural effusion. Coronary artery bypass grafting has been performed. Cardiac size within normal limits. Pulmonary vascularity is normal.  Osseous structures are age-appropriate. No acute bone abnormality. IMPRESSION: No active disease. Electronically Signed   By: Fidela Salisbury M.D.   On: 05/18/2021 03:30    Microbiology: Recent Results (from the past 240 hour(s))  Culture, blood (Routine x 2)     Status: Abnormal (Preliminary result)   Collection Time: 05/18/21  2:40 AM   Specimen: BLOOD RIGHT ARM  Result Value Ref Range Status   Specimen Description   Final    BLOOD RIGHT ARM Performed at [facility], [address], [place], [place] [address]    Special Requests   Final    BOTTLES DRAWN AEROBIC AND ANAEROBIC Blood Culture adequate volume Performed at [facility], [address], [place], [place] [address]    Culture  Setup Time   Final    GRAM NEGATIVE RODS Gram Stain Report Called to,Read Back By and Verified With:   S.VILLALOBOS @ 0708 05/20/21 BY STEPHTR BLOOD ANAEROBIC BOTTLE Performed at [facility], [address], [place], [place] [address]    Culture KLEBSIELLA PNEUMONIAE (A)  Final   Report Status PENDING  Incomplete  Resp Panel by RT-PCR (Flu A&B, Covid) Nasopharyngeal Swab     Status: None   Collection Time: 05/18/21  2:40 AM   Specimen: Nasopharyngeal Swab; Nasopharyngeal(NP) swabs in vial transport medium  Result Value Ref Range Status   SARS Coronavirus 2 by RT PCR NEGATIVE NEGATIVE Final    Comment: (NOTE) SARS-CoV-2 target nucleic acids are NOT DETECTED.  The SARS-CoV-2 RNA is generally detectable in upper respiratory specimens during the acute phase of infection. The lowest concentration of SARS-CoV-2 viral copies this assay can detect is 138 copies/mL. A negative result does not preclude SARS-Cov-2 infection and should not be used as the sole basis for treatment or other patient management decisions. A negative result may occur with  improper specimen collection/handling, submission of specimen other than nasopharyngeal swab, presence of viral mutation(s) within the areas targeted by this assay, and inadequate number of viral copies(<138 copies/mL). A negative result must be combined with clinical observations, patient history, and epidemiological information. The expected result is Negative.  Fact Sheet for Patients:  EntrepreneurPulse.com.au  Fact Sheet for Healthcare Providers:  IncredibleEmployment.be  This test is no t yet approved or cleared by the [place] FDA and  has been authorized for detection and/or diagnosis of SARS-CoV-2 by FDA under an Emergency Use Authorization (EUA). This EUA will remain  in effect (meaning this test can be used) for the duration of the COVID-19 declaration under Section 564(b)(1) of the Act, 21 U.S.C.section 360bbb-3(b)(1), unless the authorization is terminated  or revoked sooner.        Influenza A by PCR NEGATIVE NEGATIVE Final   Influenza B by PCR NEGATIVE NEGATIVE Final    Comment: (NOTE) The Xpert Xpress SARS-CoV-2/FLU/RSV plus assay is intended as an aid in the diagnosis of influenza from Nasopharyngeal swab specimens and should not be used as a sole basis for treatment. Nasal washings and aspirates are unacceptable for Xpert Xpress SARS-CoV-2/FLU/RSV testing.  Fact Sheet for Patients: EntrepreneurPulse.com.au  Fact Sheet for Healthcare Providers: IncredibleEmployment.be  This test is not yet approved or cleared by the [place] FDA and has been authorized for detection and/or diagnosis of SARS-CoV-2 by FDA under an Emergency Use Authorization (EUA). This EUA will remain in effect (meaning this test can be used) for the duration of the COVID-19 declaration under Section 564(b)(1) of the Act, 21 U.S.C. section 360bbb-3(b)(1), unless the authorization is terminated or  revoked.  Performed at [facility], [address], [place], [place] [address]   Culture, blood (Routine x 2)     Status: Abnormal   Collection Time: 05/18/21  3:17 AM   Specimen: BLOOD RIGHT FOREARM  Result Value Ref Range Status   Specimen Description   Final    BLOOD RIGHT FOREARM Performed at [facility], [address], [place], [place] [address]    Special Requests   Final    BOTTLES DRAWN AEROBIC AND ANAEROBIC Blood Culture adequate volume Performed at [facility], [address] [place], [place] [address]    Culture  Setup Time   Final    CRITICAL RESULT CALLED TO, READ BACK BY AND VERIFIED WITH: COE,H 1809 05/18/2021 COLE[place],R GNR SEEN ON GRAM STAIN, REPORTED RESULT TO L HENDERSON @0700  05/19/21 [place] GRAM NEGATIVE RODS IN BOTH AEROBIC AND ANAEROBIC BOTTLES CORRECTED RESULTS PREVIOUSLY REPORTED AS: GRAM POSITIVE COCCI CORRECTED RESULTS CALLED TO: PHARMD S [place] 05/21/21 AT 859 AM BY CM Performed at [facility], [place] [address]  [place], [place] [address]    Culture KLEBSIELLA PNEUMONIAE (A)  Final   Report Status 05/21/2021 FINAL  Final   Organism ID, Bacteria KLEBSIELLA PNEUMONIAE  Final      Susceptibility   Klebsiella pneumoniae - MIC*    AMPICILLIN RESISTANT Resistant     CEFAZOLIN <=4 SENSITIVE Sensitive     CEFEPIME <=0.12 SENSITIVE Sensitive     CEFTAZIDIME <=1 SENSITIVE Sensitive     CEFTRIAXONE <=0.25 SENSITIVE Sensitive     CIPROFLOXACIN <=0.25 SENSITIVE Sensitive     GENTAMICIN <=1 SENSITIVE Sensitive     IMIPENEM <=0.25 SENSITIVE Sensitive     TRIMETH/SULFA <=20 SENSITIVE Sensitive     AMPICILLIN/SULBACTAM 4 SENSITIVE Sensitive     * KLEBSIELLA PNEUMONIAE  Blood Culture ID Panel (Reflexed)     Status: Abnormal   Collection Time: 05/18/21  3:17 AM  Result Value Ref Range Status   Enterococcus faecalis NOT DETECTED NOT DETECTED Final   Enterococcus Faecium NOT DETECTED NOT DETECTED Final   Listeria monocytogenes NOT DETECTED NOT DETECTED Final   Staphylococcus species NOT DETECTED NOT DETECTED Final   Staphylococcus aureus (BCID) NOT DETECTED NOT DETECTED Final   Staphylococcus epidermidis NOT DETECTED NOT DETECTED Final   Staphylococcus lugdunensis NOT DETECTED NOT DETECTED Final   Streptococcus species NOT DETECTED NOT DETECTED Final   Streptococcus agalactiae NOT DETECTED NOT DETECTED Final   Streptococcus pneumoniae NOT DETECTED NOT DETECTED Final   Streptococcus pyogenes NOT DETECTED NOT DETECTED Final   A.calcoaceticus-baumannii NOT DETECTED NOT DETECTED Final   Bacteroides fragilis NOT DETECTED NOT DETECTED Final   Enterobacterales DETECTED (A) NOT DETECTED Final    Comment: Enterobacterales represent a large order of gram negative bacteria, not a single organism. CRITICAL RESULT CALLED TO, READ BACK BY AND VERIFIED WITH: REPORTED RESULT TO L HENDERSON @0700  05/19/21 [place]    Enterobacter cloacae complex NOT DETECTED NOT DETECTED Final   Escherichia coli NOT DETECTED NOT DETECTED Final    Klebsiella aerogenes NOT DETECTED NOT DETECTED Final   Klebsiella oxytoca NOT DETECTED NOT DETECTED Final   Klebsiella pneumoniae DETECTED (A) NOT DETECTED Final    Comment: CRITICAL RESULT CALLED TO, READ BACK BY AND VERIFIED WITH: REPORTED RESULT TO L HENDERSON @0700  05/19/21 [place]    Proteus species NOT DETECTED NOT DETECTED Final   Salmonella species NOT DETECTED NOT DETECTED Final   Serratia marcescens NOT DETECTED NOT DETECTED Final   Haemophilus influenzae NOT DETECTED NOT DETECTED  Final   Neisseria meningitidis NOT DETECTED NOT DETECTED Final   Pseudomonas aeruginosa NOT DETECTED NOT DETECTED Final   Stenotrophomonas maltophilia NOT DETECTED NOT DETECTED Final   Candida albicans NOT DETECTED NOT DETECTED Final   Candida auris NOT DETECTED NOT DETECTED Final   Candida glabrata NOT DETECTED NOT DETECTED Final   Candida krusei NOT DETECTED NOT DETECTED Final   Candida parapsilosis NOT DETECTED NOT DETECTED Final   Candida tropicalis NOT DETECTED NOT DETECTED Final   Cryptococcus neoformans/gattii NOT DETECTED NOT DETECTED Final   CTX-M ESBL NOT DETECTED NOT DETECTED Final   Carbapenem resistance IMP NOT DETECTED NOT DETECTED Final   Carbapenem resistance KPC NOT DETECTED NOT DETECTED Final   Carbapenem resistance NDM NOT DETECTED NOT DETECTED Final   Carbapenem resist OXA 48 LIKE NOT DETECTED NOT DETECTED Final   Carbapenem resistance VIM NOT DETECTED NOT DETECTED Final    Comment: Performed at [facility], [address], [place], [place] [address]  MRSA Next Gen by PCR, Nasal     Status: None   Collection Time: 05/18/21  7:22 PM   Specimen: Nasal Mucosa; Nasal Swab  Result Value Ref Range Status   MRSA by PCR Next Gen NOT DETECTED NOT DETECTED Final    Comment: (NOTE) The GeneXpert MRSA Assay (FDA approved for NASAL specimens only), is one component of a comprehensive MRSA colonization surveillance program. It is not intended to diagnose MRSA infection nor to guide or  monitor treatment for MRSA infections. Test performance is not FDA approved in patients less than 29 years old. Performed at [facility], [address], [place], [place] [address]      Labs: Basic Metabolic Panel: Recent Labs  Lab 05/15/21 1937 05/18/21 0240 05/19/21 0339 05/21/21 0553  NA 135 136 137 138  K 3.5 3.6 4.0 3.5  CL 103 107 111 110  CO2 25 20* 20* 24  GLUCOSE 101* 273* 312* 218*  BUN 21 19 24* 29*  CREATININE 1.32* 1.43* 1.34* 1.30*  CALCIUM 8.3* 8.0* 7.5* 7.6*   Liver Function Tests: Recent Labs  Lab 05/15/21 1937 05/18/21 0240  AST 23 33  ALT 8 10  ALKPHOS 42 88  BILITOT 0.3 0.4  PROT 5.2* 5.1*  ALBUMIN 2.4* 2.3*   CBC: Recent Labs  Lab 05/15/21 1937 05/18/21 0240  WBC 11.3* 7.0  NEUTROABS 8.4* 6.3  HGB 10.7* 9.9*  HCT 33.2* 30.7*  MCV 92.2 93.3  PLT 363 306   BNP (last 3 results) Recent Labs    08/04/20 0237  BNP 656.9*   CBG: Recent Labs  Lab 05/20/21 1112 05/20/21 1613 05/20/21 2015 05/21/21 0715 05/21/21 1115  GLUCAP 340* 342* 335* 180* 267*    Signed:  [place] Dubois MD.  [facility] 05/21/2021, 4:01 PM

## 2021-05-21 NOTE — Progress Notes (Signed)
Discharge instructions given . Pt and wife verbalized understanding

## 2021-05-21 NOTE — TOC Transition Note (Signed)
Transition of Care [facility]) - CM/SW Discharge Note   Patient Details  Name: Patrick Miranda MRN: [id number] Date of Birth: Oct 08, 1945  Transition of Care [facility]) CM/SW Contact:  Boneta Lucks, RN Phone Number: 05/21/2021, 7:43 PM   Clinical Narrative:   Patient did better with PT today, wife is wanting to take him home with home health.  Marjory Lies with [facility] accepted the referral. Updated Kerri with [facility].    Final next level of care: [place] Barriers to Discharge: Barriers Resolved   Patient Goals and CMS Choice Patient states their goals for this hospitalization and ongoing recovery are:: wants home with home health. CMS Medicare.gov Compare Post Acute Care list provided to:: Patient Represenative (must comment) Choice offered to / list presented to : Spouse  Discharge Placement        Name of family member notified: wife Patient and family notified of of transfer: 05/21/21  Discharge Plan and Services                HH Arranged: OT, PT [facility] Agency: [place] Date [place]: 05/21/21 Time [place]: [place] Representative spoke with at [place]: Marjory Lies  Readmission Risk Interventions Readmission Risk Prevention Plan 05/20/2021 05/20/2021  Transportation Screening Complete Complete  Medication Review [employer]) Complete Complete  PCP or Specialist appointment within 3-5 days of discharge Complete Not Complete  HRI or Home Care Consult - Complete  SW Recovery Care/Counseling Consult - Complete  Palliative Care Screening - Not Complete  Comments - Ordered  Skilled Nursing Facility Complete Not Complete  SNF Comments - PT pending  Some recent data might be hidden

## 2021-05-21 NOTE — Progress Notes (Addendum)
Physical Therapy Treatment Patient Details Name: Patrick Miranda MRN: [id number] DOB: [birth year] Today's Date: 05/21/2021   History of Present Illness Patrick Miranda is a 75 y.o. male with medical history significant of history of stroke, dementia, hypertension, hyperlipidemia, ischemic cardiomyopathy, type 2 diabetes mellitus, obstructive sleep apnea on CPAP, pulmonary embolism on chronic anticoagulation and history of BPH with recent TURP; who presented to the hospital secondary to increased weakness, falls and fever.  Patient denies chest pain, no nausea, no vomiting, and no hematuria.  There has not been any history of cough, sick contacts or new focal deficits.  Patient history is limited secondary to underlying dementia.    PT Comments    Pt presents in chair awake, alert, and agreeable for therapy w/ wife at bedside. Patient functioning near baseline for functional mobility and gait. Patient demonstrating ability to safely ambulate community distances using straight cane w/ only occasional slight unsteadiness, no loss of balance. Patient demonstrated ability to ambulate up/down a flight of stairs safely using the cane and railing for support w/ supervion and min guard for safety in case of fatigue. Patient tolerated sitting up in chair after therapy w/ wife present. Patient will benefit from continued physical therapy in hospital and recommended venue below to increase strength, balance, endurance for safe ADLs and gait.   Recommendations for follow up therapy are one component of a multi-disciplinary discharge planning process, led by the attending physician.  Recommendations may be updated based on patient status, additional functional criteria and insurance authorization.  Follow Up Recommendations  Skilled nursing-short term rehab (<3 hours/day)     Assistance Recommended at Discharge Frequent or constant Supervision/Assistance  Equipment Recommendations  None recommended by PT     Recommendations for Other Services       Precautions / Restrictions Precautions Precautions: Fall Restrictions Weight Bearing Restrictions: No     Mobility  Bed Mobility                    Transfers Overall transfer level: Modified independent Equipment used: Straight cane               General transfer comment: able to perform STS transfer from chair using straight cane, increased time    Ambulation/Gait Ambulation/Gait assistance: Supervision;Min guard Gait Distance (Feet): 100 Feet Assistive device: Straight cane Gait Pattern/deviations: Step-through pattern;Decreased step length - right;Decreased step length - left;Decreased stride length Gait velocity: Decreased   General Gait Details: slow cadence, occasional slight unsteadiness using cane, no loss of balance reported slight fatigue   Stairs Stairs: Yes Stairs assistance: Supervision;Min guard Stair Management: One rail Left;With cane Number of Stairs: 10 General stair comments: Demonstrated able to ambulate up/down stairs safely using cane w/ supervion and min guard for safety in case of fatigue.   Wheelchair Mobility    Modified Rankin (Stroke Patients Only)       Balance Overall balance assessment: Needs assistance Sitting-balance support: No upper extremity supported;Feet supported Sitting balance-Leahy Scale: Good Sitting balance - Comments: in chair   Standing balance support: Single extremity supported;During functional activity Standing balance-Leahy Scale: Fair Standing balance comment: fair/good using straight cane                            Cognition Arousal/Alertness: Awake/alert Behavior During Therapy: WFL for tasks assessed/performed Overall Cognitive Status: History of cognitive impairments - at baseline  General Comments: Dementia        Exercises General Exercises - Lower Extremity Ankle  Circles/Pumps: Seated;AROM;Strengthening;Both;10 reps Long Arc Quad: Seated;AROM;Strengthening;Both;10 reps Hip Flexion/Marching: Seated;AROM;Strengthening;Both;10 reps    General Comments        Pertinent Vitals/Pain Pain Assessment: No/denies pain    Home Living                          Prior Function            PT Goals (current goals can now be found in the care plan section) Acute Rehab PT Goals Patient Stated Goal: Return home w/ wife PT Goal Formulation: With patient/family Time For Goal Achievement: 06/03/21 Potential to Achieve Goals: Good Progress towards PT goals: Progressing toward goals    Frequency    Min 3X/week      PT Plan      Co-evaluation              AM-PAC PT "6 Clicks" Mobility   Outcome Measure  Help needed turning from your back to your side while in a flat bed without using bedrails?: None Help needed moving from lying on your back to sitting on the side of a flat bed without using bedrails?: None Help needed moving to and from a bed to a chair (including a wheelchair)?: None Help needed standing up from a chair using your arms (e.g., wheelchair or bedside chair)?: None Help needed to walk in hospital room?: A Little Help needed climbing 3-5 steps with a railing? : A Little 6 Click Score: 22    End of Session Equipment Utilized During Treatment: Gait belt Activity Tolerance: Patient tolerated treatment well Patient left: in chair;with call bell/phone within reach;with family/visitor present Nurse Communication: Mobility status PT Visit Diagnosis: Unsteadiness on feet (R26.81);Other abnormalities of gait and mobility (R26.89);Muscle weakness (generalized) (M62.81)     Time: 2563-8937 PT Time Calculation (min) (ACUTE ONLY): 30 min  Charges:  $Gait Training: 8-22 mins $Therapeutic Exercise: 8-22 mins                     Cassie Jones, SPT  During this treatment session, the therapist was present, participating in  and directing the treatment.  3:49 PM, 05/21/21 Lonell Grandchild, MPT Physical Therapist with [facility] [phone] office [phone] mobile phone

## 2021-05-21 NOTE — TOC Progression Note (Signed)
Transition of Care [facility]) - Progression Note    Patient Details  Name: Patrick Miranda MRN: [id number] Date of Birth: 07/06/1946  Transition of Care [facility]) CM/SW Contact  Boneta Lucks, RN Phone Number: 05/21/2021, 12:30 PM  Clinical Narrative:   [facility] made a bed offer, wife accepted. Marianna Fuss will start INS AUTH. Patient is medically ready. TOC to follow.  Expected Discharge Plan: Skilled Nursing Facility Barriers to Discharge: Continued Medical Work up  Expected Discharge Plan and Services Expected Discharge Plan: [place]

## 2021-05-21 NOTE — Care Management Important Message (Signed)
Important Message  Patient Details  Name: Patrick Miranda MRN: [id number] Date of Birth: [birth year]   Medicare Important Message Given:  Yes     Tommy Medal 05/21/2021, 12:15 PM

## 2021-05-22 LAB — CULTURE, BLOOD (ROUTINE X 2): Special Requests: ADEQUATE

## 2021-05-23 ENCOUNTER — Other Ambulatory Visit: Payer: Self-pay

## 2021-05-23 ENCOUNTER — Telehealth: Payer: Self-pay | Admitting: Cardiology

## 2021-05-23 NOTE — Telephone Encounter (Signed)
Pt was recently released from [facility], pt is due to have labwork before 05/30/21, he wants to know if it's ok for Patrick Miranda to use the labwork from [facility] so he doesn't have to get stuck again.

## 2021-05-23 NOTE — Patient Outreach (Signed)
Patrick Miranda) Care Management  [place]  05/23/2021   Patrick Miranda Mar 27, 1946 [id number]  Subjective: Patient with recent hospitalization for sepsis.  Spoke with spouse. She reports patient is better. Reviewed sepsis and signs of worsening and importance of seeking help as soon as possible.    Objective:   Encounter Medications:  Outpatient Encounter Medications as of 05/23/2021  Medication Sig   acetaminophen (TYLENOL) 325 MG tablet Take 2 tablets (650 mg total) by mouth every 6 (six) hours as needed for mild pain or headache (or Fever >/= 101).   apixaban (ELIQUIS) 5 MG TABS tablet Take 1 tablet (5 mg total) by mouth 2 (two) times daily.   aspirin EC 81 MG EC tablet Take 1 tablet (81 mg total) by mouth daily. Swallow whole.   BD PEN NEEDLE NANO U/F 32G X 4 MM MISC 1 each by Other route daily.    Biotin 5000 MCG TABS Take 5,000 mcg by mouth daily.   budesonide (ENTOCORT EC) 3 MG 24 hr capsule Take 9 mg by mouth daily.   cefdinir (OMNICEF) 300 MG capsule Take 1 capsule (300 mg total) by mouth 2 (two) times daily for 8 days.   Continuous Blood Gluc Sensor (FREESTYLE LIBRE 2 SENSOR) MISC See admin instructions.   Cyanocobalamin (B-12) 5000 MCG SUBL Place 5,000 mcg under the tongue daily.   fenofibrate 160 MG tablet Take 160 mg by mouth daily.   finasteride (PROSCAR) 5 MG tablet Take 5 mg by mouth at bedtime.   Insulin Aspart (NOVOLOG FLEXPEN [place]) Inject 0-15 Units into the skin 3 (three) times daily before meals. Dose per sliding scale   Lactobacillus Rhamnosus, GG, (CULTURELLE PO) Take 1 capsule by mouth daily.   losartan (COZAAR) 25 MG tablet Take 0.5 tablets (12.5 mg total) by mouth daily. (Patient taking differently: Take 12.5 mg by mouth at bedtime.)   memantine (NAMENDA) 10 MG tablet TAKE 1 TABLET(10 MG) BY MOUTH TWICE DAILY   metFORMIN (GLUCOPHAGE) 500 MG tablet Take 500 mg by mouth 2 (two) times daily with a meal.   metoprolol succinate (TOPROL-XL)  25 MG 24 hr tablet Take 0.5 tablets (12.5 mg total) by mouth daily.   Multiple Vitamin (MULTIVITAMIN WITH MINERALS) TABS tablet Take 1 tablet by mouth daily.   ONETOUCH VERIO test strip 1 each by Other route in the morning, at noon, and at bedtime.    rosuvastatin (CRESTOR) 20 MG tablet TAKE 1 TABLET(20 MG) BY MOUTH DAILY (Patient taking differently: Take 20 mg by mouth at bedtime.)   tamsulosin (FLOMAX) 0.4 MG CAPS capsule Take 1 capsule (0.4 mg total) by mouth daily. (Patient taking differently: Take 0.4 mg by mouth at bedtime.)   TOUJEO SOLOSTAR 300 UNIT/ML Solostar Pen Inject 15 Units into the skin daily.   traMADol (ULTRAM) 50 MG tablet Take 1 tablet (50 mg total) by mouth every 6 (six) hours as needed. (Patient not taking: No sig reported)   No facility-administered encounter medications on file as of 05/23/2021.    Functional Status:  In your present state of health, do you have any difficulty performing the following activities: 05/23/2021 05/14/2021  Hearing? Y Y  Comment no hearing aids -  Vision? N N  Difficulty concentrating or making decisions? Y Y  Comment dementia -  Walking or climbing stairs? Y N  Comment some weakness -  Dressing or bathing? Tempie Donning  Comment wife assists at times. -  Doing errands, shopping? Y N  Comment wife assists -  Preparing Food and eating ? Y -  Comment wife prepares food -  Using the Toilet? N -  In the past six months, have you accidently leaked urine? N -  Do you have problems with loss of bowel control? N -  Managing your Medications? Y -  Comment wife does -  Managing your Finances? Y -  Comment wife does -  [place], broadcasting/film/video? Y -  Comment wife does -  Some recent data might be hidden    Fall/Depression Screening: Fall Risk  05/23/2021 11/28/2020 01/19/2020  Falls in the past year? 1 0 0  Number falls in past yr: 1 - 0  Injury with Fall? 1 - 0  Comment - - N/A- no falls reported  Risk for fall due to : History  of fall(s) - No Fall Risks  Risk for fall due to: Comment - - -  Follow up Falls prevention discussed - Falls prevention discussed   PHQ 2/9 Scores 05/23/2021 11/28/2020 01/19/2020 12/20/2019  PHQ - 2 Score - - 0 1  Exception Documentation Other- indicate reason in comment box Other- indicate reason in comment box - -  Not completed - Wife Patrick Miranda does assessment - -    Assessment:   Care Plan Care Plan : Diabetes Type 2 (Adult)  Updates made by Jon Billings, RN since 05/23/2021 12:00 AM     Problem: Glycemic Management (Diabetes, Type 2)      Long-Range Goal: Glycemic Management Optimized as evidenced by A1c less than 7. Completed 05/23/2021  Start Date: 11/28/2020  Expected End Date: 07/20/2021  Recent Progress: On track  Priority: High  Note:   Evidence-based guidance:  Anticipate A1C testing (point-of-care) every 3 to 6 months based on goal attainment.  Provide medical nutrition therapy and development of individualized eating.  Compare self-reported symptoms of hypo or hyperglycemia to blood glucose levels, diet and fluid intake, current medications, psychosocial and physiologic stressors, change in activity and barriers to care adherence.  Promote self-monitoring of blood glucose levels.   Notes: 08/26/83 Duplicate goal    Task: Alleviate Barriers to Glycemic Management Completed 05/23/2021  Due Date: 07/20/2021  Priority: Routine  Responsible User: Jon Billings, RN  Note:   Care Management Activities:    - blood glucose monitoring encouraged - self-awareness of signs/symptoms of hypo or hyperglycemia encouraged    Notes: 11/28/20 Patient A1c 5.9.   01/24/21 Patient blood sugars up and down per spouse due to patient eating habits.  Advised on concern if next A1c is too elevated.    Diabetes Management Discussed: Medication adherence Reviewed importance of limiting carbs such as rice, potatoes, breads, and pastas. Also discussed limiting sweets and sugary drinks.  Discussed  importance of portion control.  Also discussed importance of annual exams, foot exams, and eye exams.       Care Plan : RN Care Manager Plan of Care  Updates made by Jon Billings, RN since 05/23/2021 12:00 AM     Problem: Chronic Care Management and care coordintation daibetes and sepsis   Priority: High     Goal: Knowledge deficit related to sepsis   Start Date: 05/23/2021  Expected End Date: 07/19/2021  Priority: High  Note:   Current Barriers:  Knowledge Deficits related to plan of care for management of Sepsis  RNCM Clinical Goal(s):  Patient will verbalize understanding of plan for management of Sepsis verbalize basic understanding of  Sepsis disease process and self health management plan Sepsis take all medications  exactly as prescribed and will call provider for medication related questions through collaboration with RN Care manager, provider, and care team.   Interventions: Provided education to patient re: sepsis and UTI Inter-disciplinary care team collaboration (see longitudinal plan of care) Evaluation of current treatment plan related to  self management and patient's adherence to plan as established by provider  SepsisNew goal. Evaluation of current treatment plan related to  Sepsis , self-management and patient's adherence to plan as established by provider. Discussed plans with patient for ongoing care management follow up and provided patient with direct contact information for care management team Evaluation of current treatment plan related to Sepsis and patient's adherence to plan as established by provider; Provided education to patient re: Sepsis; Reviewed medications with patient and discussed importance of medication adherence; Reviewed scheduled/upcoming provider appointments including; Discussed plans with patient for ongoing care management follow up and provided patient with direct contact information for care management team;  Patient  Goals/Self-Care Activities: Patient will self administer medications as prescribed Patient will attend all scheduled provider appointments Patient will call provider office for new concerns or questions Finish antibiotics as prescribed  Follow Up Plan:  Telephone follow up appointment with care management team member scheduled for:  3-4 weeks The patient has been provided with contact information for the care management team and has been advised to call with any health related questions or concerns.      Long-Range Goal: Development of plan of care for management of diabetes   Start Date: 05/23/2021  Expected End Date: 01/17/2022  This Visit's Progress: On track  Priority: High  Note:   Current Barriers:  Chronic Disease Management support and education needs related to DMII  RNCM Clinical Goal(s):  Patient will verbalize understanding of plan for management of DMII verbalize basic understanding of  DMII disease process and self health management plan Diabetes take all medications exactly as prescribed and will call provider for medication related questions continue to work with RN Care Manager to address care management and care coordination needs related to  DMII through collaboration with RN Care manager, provider, and care team.   Interventions: Provided education to patient about basic DM disease process Inter-disciplinary care team collaboration (see longitudinal plan of care) Evaluation of current treatment plan related to  self management and patient's adherence to plan as established by provider Reviewed scheduled/upcoming provider appointments including PCP    Diabetes Interventions: Assessed patient's understanding of A1c goal: <7% Reviewed importance of limiting carbs such as rice, potatoes, breads, and pastas. Provided education to patient about basic DM disease process; Reviewed medications with patient and discussed importance of medication adherence; Discussed  plans with patient for ongoing care management follow up and provided patient with direct contact information for care management team; Lab Results  Component Value Date   HGBA1C 8.8 (H) 05/08/2021  Patient blood sugars running high with recent sepsis. However sugars are coming down per wife with most recent blood sugar 170. Discussed how infection can cause sugars to spike.    Patient Goals/Self-Care Activities: Patient will self administer medications as prescribed Patient will attend all scheduled provider appointments Patient will call provider office for new concerns or questions Patient will check blood sugars as prescribed Patient will follow low carbohydrate diet  Follow Up Plan:  Telephone follow up appointment with care management team member scheduled for:  3-4 weeks The patient has been provided with contact information for the care management team and has been advised to call with  any health related questions or concerns.        Goals Addressed             This Visit's Progress    COMPLETED: Monitor and Manage My Blood Sugar-Diabetes Type 2   On track    Barriers: Knowledge Timeframe:  Long-Range Goal Priority:  High Start Date:   11/28/20                          Expected End Date: 07/20/21                     Follow Up Date 04/19/21  - check blood sugar at prescribed times - take the blood sugar log to all doctor visits    Why is this important?   Checking your blood sugar at home helps to keep it from getting very high or very low.  Writing the results in a diary or log helps the doctor know how to care for you.  Your blood sugar log should have the time, date and the results.  Also, write down the amount of insulin or other medicine that you take.  Other information, like what you ate, exercise done and how you were feeling, will also be helpful.     Notes: 11/28/20 Continue to monitor blood sugars.  A1c 5.9  01/24/21 Patient blood sugars up and down per  spouse due to patient eating habits.  Advised on concern if next A1c is too elevated.    Diabetes Management Discussed: Medication adherence Reviewed importance of limiting carbs such as rice, potatoes, breads, and pastas. Also discussed limiting sweets and sugary drinks.  Discussed importance of portion control.  Also discussed importance of annual exams, foot exams, and eye exams.   16/0/73 Duplicate goal         Plan: RN CM will send updated welcome letter to patient. RN CM will sent updated barriers letter to physician.   Follow-up: Patient agrees to Care Plan and Follow-up. Follow-up in 3-4 week(s)   Jone Baseman, RN, MSN [place] [facility] Care Management Coordinator Direct Line [phone] Cell [phone] Toll Free: [phone]  Fax: [phone]

## 2021-05-23 NOTE — Telephone Encounter (Signed)
Wife informed no need for lab work.

## 2021-05-23 NOTE — Telephone Encounter (Signed)
    His most recent BMET was on 05/21/2021 and this showed his electrolytes and renal function were stable. He does not need repeat labs prior to his visit next week.   Signed, Erma Heritage, PA-C 05/23/2021, 10:29 AM

## 2021-05-23 NOTE — Patient Instructions (Addendum)
Patient Goals/Self-Care Activities: Patient will self-administer medications as prescribed Patient will attend all scheduled provider appointments Patient will check blood sugars as prescribed Patient will follow low carbohydrate diet       Patient will call provider office for new concerns or questions Finish antibiotics as prescribed

## 2021-05-23 NOTE — Telephone Encounter (Signed)
Pt has appt with B. Ahmed Prima, PA-C on 05/30/2021. This was r/s from 05/08/2021. Pt had BMET completed on 05/08/2021. Please advise if this is acceptable or if he needs to complete new BMET.

## 2021-05-24 DIAGNOSIS — E1122 Type 2 diabetes mellitus with diabetic chronic kidney disease: Secondary | ICD-10-CM | POA: Diagnosis not present

## 2021-05-24 DIAGNOSIS — I255 Ischemic cardiomyopathy: Secondary | ICD-10-CM | POA: Diagnosis not present

## 2021-05-24 DIAGNOSIS — N390 Urinary tract infection, site not specified: Secondary | ICD-10-CM | POA: Diagnosis not present

## 2021-05-24 DIAGNOSIS — D631 Anemia in chronic kidney disease: Secondary | ICD-10-CM | POA: Diagnosis not present

## 2021-05-24 DIAGNOSIS — I5022 Chronic systolic (congestive) heart failure: Secondary | ICD-10-CM | POA: Diagnosis not present

## 2021-05-24 DIAGNOSIS — A4159 Other Gram-negative sepsis: Secondary | ICD-10-CM | POA: Diagnosis not present

## 2021-05-24 DIAGNOSIS — I130 Hypertensive heart and chronic kidney disease with heart failure and stage 1 through stage 4 chronic kidney disease, or unspecified chronic kidney disease: Secondary | ICD-10-CM | POA: Diagnosis not present

## 2021-05-24 DIAGNOSIS — Z48816 Encounter for surgical aftercare following surgery on the genitourinary system: Secondary | ICD-10-CM | POA: Diagnosis not present

## 2021-05-24 DIAGNOSIS — N1831 Chronic kidney disease, stage 3a: Secondary | ICD-10-CM | POA: Diagnosis not present

## 2021-05-24 DIAGNOSIS — F0280 Dementia in other diseases classified elsewhere without behavioral disturbance: Secondary | ICD-10-CM | POA: Diagnosis not present

## 2021-05-29 ENCOUNTER — Ambulatory Visit (INDEPENDENT_AMBULATORY_CARE_PROVIDER_SITE_OTHER): Payer: Medicare HMO | Admitting: Podiatry

## 2021-05-29 ENCOUNTER — Other Ambulatory Visit: Payer: Self-pay

## 2021-05-29 ENCOUNTER — Encounter: Payer: Self-pay | Admitting: Podiatry

## 2021-05-29 DIAGNOSIS — A419 Sepsis, unspecified organism: Secondary | ICD-10-CM | POA: Diagnosis not present

## 2021-05-29 DIAGNOSIS — D689 Coagulation defect, unspecified: Secondary | ICD-10-CM

## 2021-05-29 DIAGNOSIS — F03A0 Unspecified dementia, mild, without behavioral disturbance, psychotic disturbance, mood disturbance, and anxiety: Secondary | ICD-10-CM | POA: Diagnosis not present

## 2021-05-29 DIAGNOSIS — Z7901 Long term (current) use of anticoagulants: Secondary | ICD-10-CM | POA: Diagnosis not present

## 2021-05-29 DIAGNOSIS — I10 Essential (primary) hypertension: Secondary | ICD-10-CM | POA: Diagnosis not present

## 2021-05-29 DIAGNOSIS — E119 Type 2 diabetes mellitus without complications: Secondary | ICD-10-CM

## 2021-05-29 DIAGNOSIS — E1129 Type 2 diabetes mellitus with other diabetic kidney complication: Secondary | ICD-10-CM | POA: Diagnosis not present

## 2021-05-29 DIAGNOSIS — E785 Hyperlipidemia, unspecified: Secondary | ICD-10-CM | POA: Diagnosis not present

## 2021-05-29 DIAGNOSIS — M79676 Pain in unspecified toe(s): Secondary | ICD-10-CM | POA: Diagnosis not present

## 2021-05-29 DIAGNOSIS — B351 Tinea unguium: Secondary | ICD-10-CM | POA: Diagnosis not present

## 2021-05-29 DIAGNOSIS — N400 Enlarged prostate without lower urinary tract symptoms: Secondary | ICD-10-CM | POA: Diagnosis not present

## 2021-05-29 DIAGNOSIS — L219 Seborrheic dermatitis, unspecified: Secondary | ICD-10-CM | POA: Diagnosis not present

## 2021-05-29 DIAGNOSIS — I5022 Chronic systolic (congestive) heart failure: Secondary | ICD-10-CM | POA: Diagnosis not present

## 2021-05-29 NOTE — Progress Notes (Signed)
This patient returns to my office for at risk foot care.  This patient requires this care by a professional since this patient will be at risk due to having diabetes and CKD.and coagulation defect..  Patient is taking plavix and eliquis causing coagulation defect. This patient is unable to cut nails himself since the patient cannot reach his nails.These nails are painful walking and wearing shoes.  This patient presents for at risk foot care today.  General Appearance  Alert, conversant and in no acute stress.  Vascular  Dorsalis pedis and posterior tibial  pulses are palpable  bilaterally.  Capillary return is within normal limits  bilaterally. Temperature is within normal limits  bilaterally.  Neurologic  Senn-Weinstein monofilament wire test within normal limits  bilaterally. Muscle power within normal limits bilaterally.  Nails Thick disfigured discolored nails with subungual debris  from hallux to fifth toes bilaterally. No evidence of bacterial infection or drainage bilaterally.  Asymptomatic left hallux nail.  Orthopedic  No limitations of motion  feet .  No crepitus or effusions noted.  HAV  B/L.  Overlapping second toe left foot.  Skin  normotropic skin with no porokeratosis noted bilaterally.  No signs of infections or ulcers noted.   Asymptomatic pinch callus.  Onychomycosis  Pain in right toes  Pain in left toes  Consent was obtained for treatment procedures.   Mechanical debridement of nails 1-5  bilaterally performed with a nail nipper.  Filed with dremel without incident.    Return office visit 4  months                  Told patient to return for periodic foot care and evaluation due to potential at risk complications.   Stevana Dufner DPM  

## 2021-05-29 NOTE — Progress Notes (Signed)
Cardiology Office Note    Date:  05/30/2021   ID:  Patrick Miranda, DOB 08/20/1945, MRN [id number]  PCP:  Burnard Bunting, MD  Cardiologist: Rozann Lesches, MD    Chief Complaint  Patient presents with   Follow-up    2 month visit    History of Present Illness:    Patrick Miranda is a 75 y.o. male with past medical history of CAD (s/p CABG in 06/2020 with LIMA-LAD, SVG-D1 and SVG-RCA), HFmrEF (EF 45-50% in 06/2020, at 45% in 03/2021),  paroxysmal atrial fibrillation (occurring in the setting of COVID-19 and post-op setting), PE (occurring while diagnosed with COVID in 11/2019), apical thrombus (by echo in 07/2020), HTN, HLD, Type 2 DM, history of CVA and history of PE who presents to the office today for 23-month follow-up.  He was examined by Dr. Domenic Polite in 02/2021 and reported his activity was minimal given his overall weakness but denied any specific anginal symptoms. His EF had been at 45 to 50% by prior echocardiogram at [facility], therefore repeat imaging was recommended with possible medication adjustments pending results. His echo did show his EF was at 45% and it was recommended to try adding Losartan 12.5 mg daily as his BP would not allow for Entresto. There was no evidence of a definitive thrombus at that time.   In the interim, he was admitted for TURP in 04/2021 with no immediate complications noted but was admitted from 10/29 -05/21/2021 for sepsis in the setting of Klebsiella pneumonia UTI. He did receive IV antibiotics during admission and was transitioned to Cefdinir at the time of discharge.  In talking with the patient and his wife today, he does report feeling weak since his recent hospitalization but is starting to work with [facility]. He reports his breathing has overall been stable and denies any orthopnea or PND. Does utilize a CPAP at night. No recent chest pain or palpitations. He did experience palpitations at the time of his admission last month but had  no recurrent symptoms. His wife reports he only consumes approximately 1 bottle of water daily.   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[place])    Left ventricular mural thrombus    NSTEMI (non-ST elevated myocardial infarction) ([place])    OSA on CPAP    Pulmonary emboli [facility])    May 2021   Skin cancer    Type 2 diabetes mellitus ([place])     Past Surgical History:  Procedure Laterality Date   COLON SURGERY     COLONOSCOPY W/ POLYPECTOMY     CORONARY ARTERY BYPASS GRAFT N/A 07/09/2020   Procedure: CORONARY ARTERY BYPASS GRAFTING (CABG) TIMES THREE , USING LEFT INTERNAL MAMMARY ARTERY AND RIGHT LEG GREATER SAPHENOUS VEIN HARVESTED ENDOSCOPICALLY;  Surgeon: Ivin Poot, MD;  Location: [place];  Service: Open Heart Surgery;  Laterality: N/A;   LEFT HEART CATH AND CORONARY ANGIOGRAPHY N/A 07/04/2020   Procedure: LEFT HEART CATH AND CORONARY ANGIOGRAPHY;  Surgeon: Claiborne Billings,  Joyice Faster, MD;  Location: [place] [facility];  Service: Cardiovascular;  Laterality: N/A;   TEE WITHOUT CARDIOVERSION N/A 07/09/2020   Procedure: TRANSESOPHAGEAL ECHOCARDIOGRAM (TEE);  Surgeon: Prescott Gum, Collier Salina, MD;  Location: [place];  Service: Open Heart Surgery;  Laterality: N/A;   TOTAL KNEE ARTHROPLASTY Left 04/22/2013   Procedure: TOTAL KNEE ARTHROPLASTY- left;  Surgeon: Augustin Schooling, MD;  Location: [place];  Service: Orthopedics;  Laterality: Left;  with femoral block   TOTAL KNEE ARTHROPLASTY Right 01/06/2014   Procedure: RIGHT TOTAL KNEE ARTHROPLASTY;  Surgeon: Augustin Schooling, MD;  Location: [place];  Service: Orthopedics;   Laterality: Right;   TRANSURETHRAL RESECTION OF PROSTATE N/A 05/14/2021   Procedure: TRANSURETHRAL RESECTION OF THE PROSTATE (TURP);  Surgeon: Robley Fries, MD;  Location: [facility];  Service: Urology;  Laterality: N/A;  90 MINS    Current Medications: Outpatient Medications Prior to Visit  Medication Sig Dispense Refill   acetaminophen (TYLENOL) 325 MG tablet Take 2 tablets (650 mg total) by mouth every 6 (six) hours as needed for mild pain or headache (or Fever >/= 101).     apixaban (ELIQUIS) 5 MG TABS tablet Take 1 tablet (5 mg total) by mouth 2 (two) times daily. 42 tablet 0   aspirin EC 81 MG EC tablet Take 1 tablet (81 mg total) by mouth daily. Swallow whole. 30 tablet 11   BD PEN NEEDLE NANO U/F 32G X 4 MM MISC 1 each by Other route daily.      Biotin 5000 MCG TABS Take 5,000 mcg by mouth daily.     budesonide (ENTOCORT EC) 3 MG 24 hr capsule Take 9 mg by mouth daily.     Continuous Blood Gluc Sensor (FREESTYLE LIBRE 2 SENSOR) MISC See admin instructions.     Cyanocobalamin (B-12) 5000 MCG SUBL Place 5,000 mcg under the tongue daily.     fenofibrate 160 MG tablet Take 160 mg by mouth daily.     finasteride (PROSCAR) 5 MG tablet Take 5 mg by mouth at bedtime.     Insulin Aspart (NOVOLOG FLEXPEN [place]) Inject 0-15 Units into the skin 3 (three) times daily before meals. Dose per sliding scale     Lactobacillus Rhamnosus, GG, (CULTURELLE PO) Take 1 capsule by mouth daily.     losartan (COZAAR) 25 MG tablet Take 0.5 tablets (12.5 mg total) by mouth daily. (Patient taking differently: Take 12.5 mg by mouth at bedtime.) 45 tablet 1   memantine (NAMENDA) 10 MG tablet TAKE 1 TABLET(10 MG) BY MOUTH TWICE DAILY 180 tablet 1   metFORMIN (GLUCOPHAGE) 500 MG tablet Take 500 mg by mouth 2 (two) times daily with a meal.     metoprolol succinate (TOPROL-XL) 25 MG 24 hr tablet Take 0.5 tablets (12.5 mg total) by mouth daily.     Multiple Vitamin (MULTIVITAMIN WITH MINERALS) TABS tablet Take 1 tablet by  mouth daily.     ONETOUCH VERIO test strip 1 each by Other route in the morning, at noon, and at bedtime.   3   rosuvastatin (CRESTOR) 20 MG tablet TAKE 1 TABLET(20 MG) BY MOUTH DAILY (Patient taking differently: Take 20 mg by mouth at bedtime.) 90 tablet 3   tamsulosin (FLOMAX) 0.4 MG CAPS capsule Take 1 capsule (0.4 mg total) by mouth daily. (Patient taking differently: Take 0.4 mg by mouth at bedtime.) 30 capsule 0   TOUJEO SOLOSTAR 300 UNIT/ML Solostar Pen Inject 15 Units into the skin daily.     traMADol (  ULTRAM) 50 MG tablet Take 1 tablet (50 mg total) by mouth every 6 (six) hours as needed. 6 tablet 0   No facility-administered medications prior to visit.     Allergies:   Lipitor [atorvastatin] and Sitagliptin   Social History   Socioeconomic History   Marital status: Married    Spouse name: [name]   Number of children: Not on file   Years of education: Not on file   Highest education level: Not on file  Occupational History   Not on file  Tobacco Use   Smoking status: Never   Smokeless tobacco: Never  Vaping Use   Vaping Use: Never used  Substance and Sexual Activity   Alcohol use: No   Drug use: No   Sexual activity: Not on file  Other Topics Concern   Not on file  Social History Narrative   Lives with wife at Daughter's home   Right Handed   Drinks very little caffeine daily   Social Determinants of Health   Financial Resource Strain: Not on file  Food Insecurity: Not on file  Transportation Needs: No Transportation Needs   Lack of Transportation (Medical): No   Lack of Transportation (Non-Medical): No  Physical Activity: Not on file  Stress: Not on file  Social Connections: Not on file     Family History:  The patient's family history includes CAD in his brother and sister.   Review of Systems:    Please see the history of present illness.     All other systems reviewed and are otherwise negative except as noted above.   Physical Exam:    VS:   BP (!) 110/56   Pulse 66   Ht 5' 10.5" (1.791 m)   Wt 167 lb (75.8 kg)   SpO2 97%   BMI 23.62 kg/m    General: Pleasant elderly male appearing in no acute distress. Head: Normocephalic, atraumatic. Neck: No carotid bruits. JVD not elevated.  Lungs: Respirations regular and unlabored, without wheezes or rales.  Heart: RRR. No S3 or S4.  No murmur, no rubs, or gallops appreciated. Abdomen: Appears non-distended. No obvious abdominal masses. Msk:  Strength and tone appear normal for age. No obvious joint deformities or effusions. Extremities: No clubbing or cyanosis. No pitting edema.  Distal pedal pulses are 2+ bilaterally. Neuro: Alert and oriented X 3. Moves all extremities spontaneously. No focal deficits noted. Psych:  Responds to questions appropriately with a normal affect. Skin: No rashes or lesions noted  Wt Readings from Last 3 Encounters:  05/30/21 167 lb (75.8 kg)  05/20/21 179 lb 0.2 oz (81.2 kg)  05/15/21 162 lb 6.4 oz (73.7 kg)     Studies/Labs Reviewed:   EKG:  EKG is not ordered today.   Recent Labs: 08/04/2020: B Natriuretic Peptide 656.9 08/10/2020: Magnesium 1.9 12/06/2020: TSH 3.430 05/18/2021: ALT 10; Hemoglobin 9.9; Platelets 306 05/21/2021: BUN 29; Creatinine, Ser 1.30; Potassium 3.5; Sodium 138   Lipid Panel    Component Value Date/Time   CHOL 65 09/09/2020 0402   TRIG 94 09/09/2020 0402   HDL 21 (L) 09/09/2020 0402   CHOLHDL 3.1 09/09/2020 0402   VLDL 19 09/09/2020 0402   LDLCALC 25 09/09/2020 0402    Additional studies/ records that were reviewed today include:   Cardiac Catheterization: 06/2020 Ost LAD to Prox LAD lesion is 40% stenosed. 2nd Diag lesion is 90% stenosed. 1st Diag-1 lesion is 95% stenosed. 1st Diag-2 lesion is 90% stenosed with 90% stenosed side branch in Lat  1st Diag. Mid LAD-1 lesion is 90% stenosed. Mid LAD-2 lesion is 70% stenosed. Lat 2nd Mrg lesion is 95% stenosed. 1st Mrg lesion is 20% stenosed. Prox RCA lesion is  95% stenosed. Dist LAD lesion is 50% stenosed.   Severe coronary obstructive disease with significant calcification involving the LAD.  The LAD is diffusely diseased extending from the proximal to mid segment with 40% diffuse proximal stenosis, calcified 90% stenosis on a bend in the vessel in the region of the first and second diagonal vessels with 95% stenoses in the ostium of the first diagonal and 90% in the second diagonal, 70% mid stenosis and 50% mid distal stenoses.   The circumflex vessel has 20% smooth narrowing in the first marginal branch with 95% focal stenosis in a branch of the second marginal vessel.   The RCA has a focal 95% mid stenosis with reduced antegrade distal flow secondary to competitive left-to-right collateralization.   Normal LV function with EF estimated at 55 mm; LVEDP 12 mm Hg.   RECOMMENDATION: Surgical consultation for CABG revascularization.  Patient will need to hold Plavix and will need Plavix washout.  Echocardiogram: 03/2021 IMPRESSIONS     1. Akinesis of the inferior/inferoseptal base with aneurysmal dilitation.  Apical hypokinesis. . Left ventricular ejection fraction, by estimation,  is 45%. The left ventricle has mildly decreased function. The left  ventricle demonstrates regional wall  motion abnormalities (see scoring diagram/findings for description). Left  ventricular diastolic parameters are consistent with Grade I diastolic  dysfunction (impaired relaxation).   2. Right ventricular systolic function is normal. The right ventricular  size is normal. There is normal pulmonary artery systolic pressure.   3. The mitral valve is normal in structure. Trivial mitral valve  regurgitation. No evidence of mitral stenosis.   4. The aortic valve has an indeterminant number of cusps. Aortic valve  regurgitation is mild. No aortic stenosis is present.   Assessment:    1. Coronary artery disease involving native coronary artery of native heart  without angina pectoris   2. Ischemic cardiomyopathy   3. Paroxysmal atrial fibrillation (HCC)   4. LV (left ventricular) mural thrombus   5. Hyperlipidemia LDL goal <70      Plan:   In order of problems listed above:  1. CAD - He underwent CABG in 06/2020 with LIMA-LAD, SVG-D1 and SVG-RCA.  He reports his breathing has been stable and denies any recent chest pain. Continue ASA 81 mg daily, Toprol-XL 12.5 mg daily and Crestor 20 mg daily.  2. HFmrEF - His EF was at 45-50% in 06/2020 and at 45% in 03/2021. He denies any recent orthopnea or PND and appears euvolemic on examination today. He is currently on Losartan 12.5 mg daily and Toprol-XL 12.5 mg daily as Spironolactone was discontinued during his recent admission given hypotension. Given his softer BP of 110/56 today, minimal PO intake per his wife's report, and balance issues at home I did not restart Spironolactone today. Could reconsider at follow-up visits pending reassessment of his BP at that time. Would not be an ideal SGLT2 inhibitor candidate given his recurrent UTI's.  3. Paroxysmal Atrial Fibrillation - He denies any recent palpitations and heart rate is well controlled in the 60's today. Continue Toprol-XL 12.5 mg daily for rate control.   - He denies any evidence of active bleeding and remains on Eliquis 5 mg twice daily for anticoagulation which is the appropriate dose at this time given his age, weight and kidney function.  4. Apical  Thrombus - He was initially on Coumadin but was switched to Eliquis given difficult to control INR's. His thrombus had resolved by most recent echocardiogram.  5. HLD - His LDL was at 25 in 08/2020. Continue Crestor 20 mg daily and Fenofibrate.   Medication Adjustments/Labs and Tests Ordered: Current medicines are reviewed at length with the patient today.  Concerns regarding medicines are outlined above.  Medication changes, Labs and Tests ordered today are listed in the Patient  Instructions below. Patient Instructions  Medication Instructions:  Your physician recommends that you continue on your current medications as directed. Please refer to the Current Medication list given to you today.  *If you need a refill on your cardiac medications before your next appointment, please call your pharmacy*   Lab Work: None today  If you have labs (blood work) drawn today and your tests are completely normal, you will receive your results only by: [place] (if you have MyChart) OR A paper copy in the mail If you have any lab test that is abnormal or we need to change your treatment, we will call you to review the results.   Testing/Procedures: None today    Follow-Up: At [facility], you and your health needs are our priority.  As part of our continuing mission to provide you with exceptional heart care, we have created designated Provider Care Teams.  These Care Teams include your primary Cardiologist (physician) and Advanced Practice Providers (APPs -  Physician Assistants and Nurse Practitioners) who all work together to provide you with the care you need, when you need it.  We recommend signing up for the patient portal called "MyChart".  Sign up information is provided on this After Visit Summary.  MyChart is used to connect with patients for Virtual Visits (Telemedicine).  Patients are able to view lab/test results, encounter notes, upcoming appointments, etc.  Non-urgent messages can be sent to your provider as well.   To learn more about what you can do with MyChart, go to [email or web address].    Your next appointment:   6 month(s)  The format for your next appointment:   In Person  Provider:   Rozann Lesches, MD or Bernerd Pho, PA-C    Other Instructions None    Signed, Erma Heritage, PA-C  05/30/2021 4:52 PM    [facility]. [address] [place], [place] [address] Phone: [phone] Fax: [phone]

## 2021-05-30 ENCOUNTER — Encounter: Payer: Self-pay | Admitting: Student

## 2021-05-30 ENCOUNTER — Ambulatory Visit (INDEPENDENT_AMBULATORY_CARE_PROVIDER_SITE_OTHER): Payer: Medicare HMO | Admitting: Student

## 2021-05-30 VITALS — BP 110/56 | HR 66 | Ht 70.5 in | Wt 167.0 lb

## 2021-05-30 DIAGNOSIS — E785 Hyperlipidemia, unspecified: Secondary | ICD-10-CM

## 2021-05-30 DIAGNOSIS — I513 Intracardiac thrombosis, not elsewhere classified: Secondary | ICD-10-CM

## 2021-05-30 DIAGNOSIS — I2510 Atherosclerotic heart disease of native coronary artery without angina pectoris: Secondary | ICD-10-CM

## 2021-05-30 DIAGNOSIS — I255 Ischemic cardiomyopathy: Secondary | ICD-10-CM

## 2021-05-30 DIAGNOSIS — I480 Paroxysmal atrial fibrillation: Secondary | ICD-10-CM | POA: Diagnosis not present

## 2021-05-30 NOTE — Patient Instructions (Signed)
Medication Instructions:  Your physician recommends that you continue on your current medications as directed. Please refer to the Current Medication list given to you today.  *If you need a refill on your cardiac medications before your next appointment, please call your pharmacy*   Lab Work: None today  If you have labs (blood work) drawn today and your tests are completely normal, you will receive your results only by: [place] (if you have MyChart) OR A paper copy in the mail If you have any lab test that is abnormal or we need to change your treatment, we will call you to review the results.   Testing/Procedures: None today    Follow-Up: At [facility], you and your health needs are our priority.  As part of our continuing mission to provide you with exceptional heart care, we have created designated Provider Care Teams.  These Care Teams include your primary Cardiologist (physician) and Advanced Practice Providers (APPs -  Physician Assistants and Nurse Practitioners) who all work together to provide you with the care you need, when you need it.  We recommend signing up for the patient portal called "MyChart".  Sign up information is provided on this After Visit Summary.  MyChart is used to connect with patients for Virtual Visits (Telemedicine).  Patients are able to view lab/test results, encounter notes, upcoming appointments, etc.  Non-urgent messages can be sent to your provider as well.   To learn more about what you can do with MyChart, go to [email or web address].    Your next appointment:   6 month(s)  The format for your next appointment:   In Person  Provider:   Rozann Lesches, MD or Bernerd Pho, PA-C    Other Instructions None

## 2021-06-16 ENCOUNTER — Other Ambulatory Visit: Payer: Self-pay | Admitting: Cardiology

## 2021-06-17 ENCOUNTER — Other Ambulatory Visit: Payer: Self-pay

## 2021-06-17 NOTE — Patient Outreach (Signed)
[facility]) Care Management  06/17/2021  [place] 03/17/1946 [id number]   Telephone call to patient for follow up. No answer.  HIPAA compliant voice message left.  Plan: RN CM will attempt again within 4 business days.    Jone Baseman, RN, MSN [place] Management Care Management Coordinator Direct Line [phone] Cell [phone] Toll Free: [phone]  Fax: [phone]

## 2021-06-21 ENCOUNTER — Other Ambulatory Visit: Payer: Self-pay

## 2021-06-21 NOTE — Patient Outreach (Signed)
[facility]) Care Management  [place]  06/21/2021   [place] Dec 09, 1945 [id number]  Subjective: Spoke with spouse. She reports patient is doing good. No problems with urinary retention.  She reports blood sugars increased due to patient possibly eating things he should not. Discussed importance of diabetes control.  She verbalized understanding.  No concerns.    Objective:   Encounter Medications:  Outpatient Encounter Medications as of 06/21/2021  Medication Sig   acetaminophen (TYLENOL) 325 MG tablet Take 2 tablets (650 mg total) by mouth every 6 (six) hours as needed for mild pain or headache (or Fever >/= 101).   apixaban (ELIQUIS) 5 MG TABS tablet Take 1 tablet (5 mg total) by mouth 2 (two) times daily.   aspirin EC 81 MG EC tablet Take 1 tablet (81 mg total) by mouth daily. Swallow whole.   BD PEN NEEDLE NANO U/F 32G X 4 MM MISC 1 each by Other route daily.    Biotin 5000 MCG TABS Take 5,000 mcg by mouth daily.   budesonide (ENTOCORT EC) 3 MG 24 hr capsule Take 9 mg by mouth daily.   Continuous Blood Gluc Sensor (FREESTYLE LIBRE 2 SENSOR) MISC See admin instructions.   Cyanocobalamin (B-12) 5000 MCG SUBL Place 5,000 mcg under the tongue daily.   fenofibrate 160 MG tablet Take 160 mg by mouth daily.   finasteride (PROSCAR) 5 MG tablet Take 5 mg by mouth at bedtime.   Insulin Aspart (NOVOLOG FLEXPEN [place]) Inject 0-15 Units into the skin 3 (three) times daily before meals. Dose per sliding scale   Lactobacillus Rhamnosus, GG, (CULTURELLE PO) Take 1 capsule by mouth daily.   losartan (COZAAR) 25 MG tablet Take 0.5 tablets (12.5 mg total) by mouth daily. (Patient taking differently: Take 12.5 mg by mouth at bedtime.)   memantine (NAMENDA) 10 MG tablet TAKE 1 TABLET(10 MG) BY MOUTH TWICE DAILY   metFORMIN (GLUCOPHAGE) 500 MG tablet Take 500 mg by mouth 2 (two) times daily with a meal.   metoprolol succinate (TOPROL-XL) 25 MG 24 hr tablet Take 0.5  tablets (12.5 mg total) by mouth daily.   Multiple Vitamin (MULTIVITAMIN WITH MINERALS) TABS tablet Take 1 tablet by mouth daily.   ONETOUCH VERIO test strip 1 each by Other route in the morning, at noon, and at bedtime.    tamsulosin (FLOMAX) 0.4 MG CAPS capsule Take 1 capsule (0.4 mg total) by mouth daily. (Patient taking differently: Take 0.4 mg by mouth at bedtime.)   TOUJEO SOLOSTAR 300 UNIT/ML Solostar Pen Inject 15 Units into the skin daily.   traMADol (ULTRAM) 50 MG tablet Take 1 tablet (50 mg total) by mouth every 6 (six) hours as needed.   rosuvastatin (CRESTOR) 20 MG tablet TAKE 1 TABLET(20 MG) BY MOUTH DAILY (Patient not taking: Reported on 06/21/2021)   No facility-administered encounter medications on file as of 06/21/2021.    Functional Status:  In your present state of health, do you have any difficulty performing the following activities: 06/21/2021 05/23/2021  Hearing? Y Y  Comment no hearing aids no hearing aids  Vision? N N  Difficulty concentrating or making decisions? Y Y  Comment dementia dementia  Walking or climbing stairs? Y Y  Comment some weakness some weakness  Dressing or bathing? Tempie Donning  Comment wife assists at times with bathing and getting clothes out wife assists at times.  Doing errands, shopping? Tempie Donning  Comment wife assists with appointments and groceries wife assists  Conservation officer, nature  and eating ? Tempie Donning  Comment wife prepares food wife prepares food  Using the Toilet? N N  In the past six months, have you accidently leaked urine? - N  Do you have problems with loss of bowel control? N N  Managing your Medications? Tempie Donning  Comment wife does medications wife does  Managing your Finances? Tempie Donning  Comment wife does wife does  Runner, broadcasting/film/video? Tempie Donning  Comment wife does housekeeping wife does  Some recent data might be hidden    Fall/Depression Screening: Fall Risk  06/21/2021 05/23/2021 11/28/2020  Falls in the past year? 1 1 0  Number falls  in past yr: 1 1 -  Injury with Fall? 1 1 -  Comment - - -  Risk for fall due to : History of fall(s) History of fall(s) -  Risk for fall due to: Comment - - -  Follow up Falls prevention discussed Falls prevention discussed -   PHQ 2/9 Scores 06/21/2021 05/23/2021 11/28/2020 01/19/2020 12/20/2019  PHQ - 2 Score - - - 0 1  Exception Documentation Other- indicate reason in comment box Other- indicate reason in comment box Other- indicate reason in comment box - -  Not completed Wife [name] does assessment - Wife [name] does assessment - -    Assessment:   Care Plan Care Plan : Diabetes Type 2 (Adult)  Updates made by Jon Billings, RN since 06/21/2021 12:00 AM  Completed 06/21/2021   Problem: Glycemic Management (Diabetes, Type 2) Resolved 06/21/2021     Care Plan : RN Care Manager Plan of Care  Updates made by Jon Billings, RN since 06/21/2021 12:00 AM     Problem: Chronic Care Management and care coordintation daibetes and sepsis   Priority: High     Goal: Knowledge deficit related to sepsis Completed 06/21/2021  Start Date: 05/23/2021  Expected End Date: 07/19/2021  This Visit's Progress: On track  Priority: High  Note:   Current Barriers:  Knowledge Deficits related to plan of care for management of Sepsis  RNCM Clinical Goal(s):  Patient will verbalize understanding of plan for management of Sepsis verbalize basic understanding of  Sepsis disease process and self health management plan Sepsis take all medications exactly as prescribed and will call provider for medication related questions through collaboration with RN Care manager, provider, and care team.   Interventions: Provided education to patient re: sepsis and UTI Inter-disciplinary care team collaboration (see longitudinal plan of care) Evaluation of current treatment plan related to  self management and patient's adherence to plan as established by provider  SepsisNew goal. and Goal Met. Evaluation of current  treatment plan related to  Sepsis , self-management and patient's adherence to plan as established by provider. Discussed plans with patient for ongoing care management follow up and provided patient with direct contact information for care management team Evaluation of current treatment plan related to Sepsis and patient's adherence to plan as established by provider; Provided education to patient re: Sepsis; Reviewed medications with patient and discussed importance of medication adherence; Reviewed scheduled/upcoming provider appointments including; Discussed plans with patient for ongoing care management follow up and provided patient with direct contact information for care management team;  Patient Goals/Self-Care Activities: Patient will self administer medications as prescribed Patient will attend all scheduled provider appointments Patient will call provider office for new concerns or questions Finish antibiotics as prescribed  Follow Up Plan:  Telephone follow up appointment with care management team member scheduled for:  3-4 weeks The patient has been provided with contact information for the care management team and has been advised to call with any health related questions or concerns.      Long-Range Goal: Development of plan of care for management of diabetes   Start Date: 05/23/2021  Expected End Date: 01/17/2022  This Visit's Progress: On track  Recent Progress: On track  Priority: High  Note:   Current Barriers:  Chronic Disease Management support and education needs related to DMII  RNCM Clinical Goal(s):  Patient will verbalize understanding of plan for management of DMII verbalize basic understanding of  DMII disease process and self health management plan Diabetes take all medications exactly as prescribed and will call provider for medication related questions continue to work with RN Care Manager to address care management and care coordination needs related to   DMII through collaboration with RN Care manager, provider, and care team.   Interventions: Provided education to patient about basic DM disease process Inter-disciplinary care team collaboration (see longitudinal plan of care) Evaluation of current treatment plan related to  self management and patient's adherence to plan as established by provider Reviewed scheduled/upcoming provider appointments including PCP    Diabetes Interventions: Assessed patient's understanding of A1c goal: <7% Reviewed importance of limiting carbs such as rice, potatoes, breads, and pastas. Provided education to patient about basic DM disease process Discussed plans with patient for ongoing care management follow up and provided patient with direct contact information for care management team Lab Results  Component Value Date   HGBA1C 8.8 (H) 05/08/2021  06/21/21 Patient blood sugars still  running high.  Wife reports patient eating thing he should not. Discussed importance of diabetes management.   Patient Goals/Self-Care Activities: check blood sugar at prescribed times: once daily take the blood sugar log to all doctor visits Patient will follow low carbohydrate diet  Follow Up Plan:  Telephone follow up appointment with care management team member scheduled for:  January The patient has been provided with contact information for the care management team and has been advised to call with any health related questions or concerns.        Goals Addressed   None     Plan:  Follow-up: Patient agrees to Care Plan and Follow-up. Follow-up in 6-8 week(s)   Jone Baseman, RN, MSN [place] [facility] Care Management Coordinator Direct Line [phone] Cell [phone] Toll Free: [phone]  Fax: [phone]

## 2021-06-21 NOTE — Patient Instructions (Signed)
Patient Goals/Self-Care Activities: Diabetes check blood sugar at prescribed times: once daily take the blood sugar log to all doctor visits Patient will follow low carbohydrate diet

## 2021-06-24 DIAGNOSIS — J069 Acute upper respiratory infection, unspecified: Secondary | ICD-10-CM | POA: Diagnosis not present

## 2021-06-24 DIAGNOSIS — I2699 Other pulmonary embolism without acute cor pulmonale: Secondary | ICD-10-CM | POA: Diagnosis not present

## 2021-06-24 DIAGNOSIS — Z7901 Long term (current) use of anticoagulants: Secondary | ICD-10-CM | POA: Diagnosis not present

## 2021-06-24 DIAGNOSIS — R0981 Nasal congestion: Secondary | ICD-10-CM | POA: Diagnosis not present

## 2021-06-24 DIAGNOSIS — I5022 Chronic systolic (congestive) heart failure: Secondary | ICD-10-CM | POA: Diagnosis not present

## 2021-06-24 DIAGNOSIS — F03B0 Unspecified dementia, moderate, without behavioral disturbance, psychotic disturbance, mood disturbance, and anxiety: Secondary | ICD-10-CM | POA: Diagnosis not present

## 2021-06-24 DIAGNOSIS — J09X2 Influenza due to identified novel influenza A virus with other respiratory manifestations: Secondary | ICD-10-CM | POA: Diagnosis not present

## 2021-06-24 DIAGNOSIS — F03A0 Unspecified dementia, mild, without behavioral disturbance, psychotic disturbance, mood disturbance, and anxiety: Secondary | ICD-10-CM | POA: Diagnosis not present

## 2021-06-24 DIAGNOSIS — I10 Essential (primary) hypertension: Secondary | ICD-10-CM | POA: Diagnosis not present

## 2021-06-27 ENCOUNTER — Ambulatory Visit: Payer: Self-pay

## 2021-07-01 DIAGNOSIS — A4189 Other specified sepsis: Secondary | ICD-10-CM | POA: Diagnosis not present

## 2021-07-01 DIAGNOSIS — R8279 Other abnormal findings on microbiological examination of urine: Secondary | ICD-10-CM | POA: Diagnosis not present

## 2021-07-01 DIAGNOSIS — R3912 Poor urinary stream: Secondary | ICD-10-CM | POA: Diagnosis not present

## 2021-07-03 DIAGNOSIS — K529 Noninfective gastroenteritis and colitis, unspecified: Secondary | ICD-10-CM | POA: Diagnosis not present

## 2021-07-04 DIAGNOSIS — L821 Other seborrheic keratosis: Secondary | ICD-10-CM | POA: Diagnosis not present

## 2021-07-04 DIAGNOSIS — L218 Other seborrheic dermatitis: Secondary | ICD-10-CM | POA: Diagnosis not present

## 2021-07-17 DIAGNOSIS — E1129 Type 2 diabetes mellitus with other diabetic kidney complication: Secondary | ICD-10-CM | POA: Diagnosis not present

## 2021-07-17 DIAGNOSIS — E785 Hyperlipidemia, unspecified: Secondary | ICD-10-CM | POA: Diagnosis not present

## 2021-07-17 DIAGNOSIS — N1831 Chronic kidney disease, stage 3a: Secondary | ICD-10-CM | POA: Diagnosis not present

## 2021-07-17 DIAGNOSIS — Z794 Long term (current) use of insulin: Secondary | ICD-10-CM | POA: Diagnosis not present

## 2021-07-17 DIAGNOSIS — I129 Hypertensive chronic kidney disease with stage 1 through stage 4 chronic kidney disease, or unspecified chronic kidney disease: Secondary | ICD-10-CM | POA: Diagnosis not present

## 2021-07-23 ENCOUNTER — Other Ambulatory Visit: Payer: Self-pay | Admitting: Cardiology

## 2021-07-23 NOTE — Telephone Encounter (Signed)
Prescription refill request for Eliquis received. Indication: Atrial Fib/CVA Last office visit: 05/30/21  B Strader PA-C Scr: 1.30 on 05/21/21 Age: 76 Weight: 75.8  Based on above findings Eliquis 5mg  twice daily is the appropriate dose.  Refill approved.

## 2021-08-07 ENCOUNTER — Other Ambulatory Visit: Payer: Self-pay

## 2021-08-07 NOTE — Patient Outreach (Signed)
[facility]) Care Management  08/07/2021  Patrick Miranda 1945/08/05 [id number]  CMA made telephone outreach to cancel patients upcoming appointment as care coordinator Jon Billings, RN is out of the office. CMA spoke with patients spouse and spouse shared that patient would await for call next month to reschedule appointment.  Ina Homes [facility] Management Assistant [phone]

## 2021-08-08 DIAGNOSIS — E1129 Type 2 diabetes mellitus with other diabetic kidney complication: Secondary | ICD-10-CM | POA: Diagnosis not present

## 2021-08-09 ENCOUNTER — Ambulatory Visit: Payer: Self-pay

## 2021-08-30 ENCOUNTER — Encounter: Payer: Self-pay | Admitting: Podiatry

## 2021-08-30 ENCOUNTER — Ambulatory Visit: Payer: Medicare HMO | Admitting: Podiatry

## 2021-08-30 ENCOUNTER — Other Ambulatory Visit: Payer: Self-pay

## 2021-08-30 DIAGNOSIS — M79676 Pain in unspecified toe(s): Secondary | ICD-10-CM

## 2021-08-30 DIAGNOSIS — E119 Type 2 diabetes mellitus without complications: Secondary | ICD-10-CM | POA: Diagnosis not present

## 2021-08-30 DIAGNOSIS — D689 Coagulation defect, unspecified: Secondary | ICD-10-CM | POA: Diagnosis not present

## 2021-08-30 DIAGNOSIS — B351 Tinea unguium: Secondary | ICD-10-CM

## 2021-08-30 NOTE — Progress Notes (Signed)
This patient returns to my office for at risk foot care.  This patient requires this care by a professional since this patient will be at risk due to having diabetes and CKD.and coagulation defect..  Patient is taking plavix and eliquis causing coagulation defect. This patient is unable to cut nails himself since the patient cannot reach his nails.These nails are painful walking and wearing shoes.  This patient presents for at risk foot care today.  General Appearance  Alert, conversant and in no acute stress.  Vascular  Dorsalis pedis and posterior tibial  pulses are palpable  bilaterally.  Capillary return is within normal limits  bilaterally. Temperature is within normal limits  bilaterally.  Neurologic  Senn-Weinstein monofilament wire test within normal limits  bilaterally. Muscle power within normal limits bilaterally.  Nails Thick disfigured discolored nails with subungual debris  from hallux to fifth toes bilaterally. No evidence of bacterial infection or drainage bilaterally.  Asymptomatic left hallux nail.  Orthopedic  No limitations of motion  feet .  No crepitus or effusions noted.  HAV  B/L.  Overlapping second toe left foot.  Skin  normotropic skin with no porokeratosis noted bilaterally.  No signs of infections or ulcers noted.   Asymptomatic pinch callus.  Onychomycosis  Pain in right toes  Pain in left toes  Consent was obtained for treatment procedures.   Mechanical debridement of nails 1-5  bilaterally performed with a nail nipper.  Filed with dremel without incident.    Return office visit 4  months                  Told patient to return for periodic foot care and evaluation due to potential at risk complications.   Aaric Dolph DPM  

## 2021-09-09 ENCOUNTER — Other Ambulatory Visit: Payer: Self-pay

## 2021-09-09 NOTE — Patient Outreach (Signed)
[place] [facility]) Care Management  09/09/2021  [place] 10-05-1945 [id number]   Telephone call to patient for disease management follow up.   No answer.  HIPAA compliant voice message left.    Plan: If no return call, RN CM will attempt patient again in April.    Jone Baseman, RN, MSN [facility] Care Management Care Management Coordinator Direct Line [phone] Toll Free: [phone]  Fax: [phone]

## 2021-09-13 ENCOUNTER — Other Ambulatory Visit: Payer: Self-pay

## 2021-09-13 MED ORDER — METOPROLOL SUCCINATE ER 25 MG PO TB24: 12.5000 mg | ORAL_TABLET | Freq: Every day | ORAL | 3 refills | Status: DC

## 2021-09-13 NOTE — Telephone Encounter (Signed)
[facility] asked for clarification of patients Toprol XL (12.5 mg) . They state wife had requested refill for BID dosing when in fact he is on once a day dosing.    I clarified with wife,she had been giving him twice a day dosing. She was confused because he had taken lopressor in the past. She verbalized understanding that patient is taking metoprolol succinate 12.5 mg once daily.     I spoke with pharmacist.Josh and he will refill toprol XL 12.5 mg qd for them.

## 2021-09-16 ENCOUNTER — Other Ambulatory Visit: Payer: Self-pay

## 2021-09-16 NOTE — Patient Outreach (Signed)
[place] [facility]) Care Management  09/16/2021  Patrick Miranda [birth year] [id number]  CMA made telephone outreach call to complete [facility] Patient Satisfaction Survey. Call successfully completed and documented for future purposes.  Ina Homes [facility] Management Assistant [phone]

## 2021-09-19 ENCOUNTER — Other Ambulatory Visit: Payer: Self-pay | Admitting: Cardiology

## 2021-09-23 ENCOUNTER — Encounter: Payer: Self-pay | Admitting: Neurology

## 2021-09-23 ENCOUNTER — Ambulatory Visit: Payer: Medicare HMO | Admitting: Neurology

## 2021-09-23 VITALS — BP 119/74 | HR 90 | Ht 70.0 in | Wt 177.0 lb

## 2021-09-23 DIAGNOSIS — R413 Other amnesia: Secondary | ICD-10-CM

## 2021-09-23 DIAGNOSIS — F01A0 Vascular dementia, mild, without behavioral disturbance, psychotic disturbance, mood disturbance, and anxiety: Secondary | ICD-10-CM | POA: Diagnosis not present

## 2021-09-23 NOTE — Progress Notes (Signed)
[facility] [address] [place]. [place] [address] [phone]       OFFICE FOLLOW UP VISIT NOTE  [name] Date of Birth:  1946/06/24 Medical Record Number:  [id number]   Referring MD: Dr. Erlinda Hong Reason for Referral: Dementia  HPI: Initial visit 12/06/2020 :[name] is a 34 Caucasian male seen today for initial office consultation visit following hospital admission for confusion.  History is obtained from the patient and wife and review of electronic medical records and I personally reviewed available pertinent imaging films in PACS.  He has past medical history of right pontine stroke in 2013, coronary artery disease s/p CABG December 21, hypertension, hyperlipidemia, cardiomyopathy, left ventricular thrombus and pulmonary emboli.  Patient was admitted to [facility] in February 2022 for altered mental status for couple of weeks.  He had some odd behavior and was confused on multiple occasions.  CT scan of the head was showed suspected subacute left frontal and old right parietal infarcts.  MRI scan showed some T2 shine through but no acute infarct and dislocations.  2D echo showed ejection fraction of 40% with a kinesis of the inferior and inferior septal base with aneurysmal dilatation.  This was improved compared with previous echo from January 2022 when there was apical thrombus which was no longer seen.  EEG showed mild diffuse slowing suggestive of encephalopathy but no seizures.  LDL cholesterol 25 mg percent.  Hemoglobin A1c was 6.8.  Patient had been on Coumadin which she had a tough time taking and was switched to Eliquis which she is not tolerating much better without bruising or bleeding.  Continues to have memory loss and cognitive impairment which is unchanged.  Patient not been evaluated for reversible causes of cognitive impairment and not been tried on medication like Aricept or Namenda yet.  Patient had a remote history of a right pontine  stroke in 2013 and had only mild speech difficulties which resolved completely.  He had no other strokes due to his recent cardiac surgery in December.  He had transient postop A. fib as well and a left ventricular thrombus on the echo which has since resolved.  There is no family history of Alzheimer's.  Patient has no history of seizures or significant head injury with loss of consciousness.  Patient's cognitive impairment is mostly related to short-term memory and remembering recent information.  He still mostly independent in activities of daily living and can attend to his own bodily needs.  He does get frustrated easily but there is no agitation, violent behavior, delusions or hallucinations.  He has not exhibited any unsafe behavior and does not wander off.  His balance is poor and he does use a cane and he fell once in the bathroom a month ago does not fall on a regular basis. Update 03/13/2021 ; He returns for follow-up after last visit 3 months ago.  He is accompanied by his wife.   They feel his memory is slightly worse.  He does get confused off and on at times.  He is still mostly independent with activities of daily living but he needs constant reminders and repeated reminders about recent staffing which he forgets.  He has not been driving.  Patient has been complaining of getting tired easily and hence does not walk a lot.  Wife feels at times when he is tired he can drag his feet.  He has had no falls or injuries.  There have been no delusions or  hallucinations noted.  He does get occasionally agitated but there has been no violent behavior.  He continues to live with his wife and daughter and have somebody around him all the time.  He did undergo lab work on 12/06/2020 which was significant for low vitamin B12 levels of 153 otherwise TSH, homocystine and RPR were normal.  EEG on 12/31/2020 was normal.  Patient had a fall and was seen in the ER on 02/17/2021 and had a CT scan of the head which showed  mild generalized atrophy and bilateral lower cortical infarcts with no acute abnormality.  On Mini-Mental status exam today he actually scored 27 out of 30 which is an improvement from last visit when he had scored 59/30. Update 09/23/2021 : He returns for follow-up after last visit 6 months ago.  He is accompanied by his wife and daughter.  They feel he is doing about the same.  His memory and cognitive difficulties are unchanged.  He remains on Namenda 10 mg twice daily which is tolerating well without side effects.  Patient seems to sleep a lot.  Does not walk a lot for exercise.  He has not been regularly doing cognitively challenging activities.  He has not had any new health issues.  Remains on Eliquis which is tolerating well without bleeding or bruising.  Blood pressures well controlled today it is 119/74.  Sugars are also under good control.  He is tolerating Crestor well without muscle aches and pains.  He has no new complaints today.  On Mini-Mental status exam today scored 25/30 which is similar to 27/30 at last visit 6 months ago.  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 s/p CABG in 06/2020 with LIMA-LAD, SVG-D1 and SVG-RCA   COVID-07 Dec 2019   Dementia [facility])    Depression    Dysrhythmia    Essential hypertension    Hernia, umbilical    HOH (hard of hearing)    Hyperlipidemia    Ischemic cardiomyopathy    Ischemic necrosis of small bowel (HCC)    Left ventricular mural thrombus    NSTEMI (non-ST elevated myocardial infarction) (HCC)    OSA on CPAP    Pulmonary emboli [facility])    May 2021   Skin cancer    Type 2 diabetes mellitus ([place])     Social History:   Social History   Socioeconomic History   Marital status: Married    Spouse name: [name]   Number of children: Not on file   Years of education: Not on file   Highest education level: Not on file  Occupational History   Not on file  Tobacco Use   Smoking status: Never   Smokeless tobacco: Never  Vaping Use   Vaping Use: Never used  Substance and Sexual Activity   Alcohol use: No   Drug use: No   Sexual activity: Not on file  Other Topics Concern   Not on file  Social History Narrative   Lives with wife at Daughter's home   Right Handed   Drinks very little caffeine daily   Social Determinants of Health   Financial Resource Strain:  Not on file  Food Insecurity: No Food Insecurity   Worried About [place] in the Last Year: Never true   Ran Out of Food in the Last Year: Never true  Transportation Needs: No Transportation Needs   Lack of Transportation (Medical): No   Lack of Transportation (Non-Medical): No  Physical Activity: Not on file  Stress: Not on file  Social Connections: Not on file  Intimate Partner Violence: Not on file    Medications:   Current Outpatient Medications on File Prior to Visit  Medication Sig Dispense Refill   acetaminophen (TYLENOL) 325 MG tablet Take 2 tablets (650 mg total) by mouth every 6 (six) hours as needed for mild pain or headache (or Fever >/= 101).     apixaban (ELIQUIS) 5 MG TABS tablet TAKE 1 TABLET(5 MG) BY MOUTH TWICE DAILY 60 tablet 5   aspirin EC 81 MG EC tablet Take 1 tablet (81 mg total) by mouth daily. Swallow whole. 30 tablet 11   BD PEN NEEDLE NANO U/F 32G X 4 MM MISC 1 each by Other route daily.      Biotin 5000 MCG TABS Take 5,000 mcg by mouth daily.     Continuous Blood Gluc Sensor (FREESTYLE LIBRE 2 SENSOR) MISC See admin instructions.     Cyanocobalamin (B-12) 5000 MCG SUBL Place 5,000 mcg under the tongue daily.     Insulin Aspart (NOVOLOG FLEXPEN [place]) Inject 0-15 Units into the skin 3 (three) times daily  before meals. Dose per sliding scale     losartan (COZAAR) 25 MG tablet TAKE 1/2 TABLET(12.5 MG) BY MOUTH DAILY 45 tablet 1   memantine (NAMENDA) 10 MG tablet TAKE 1 TABLET(10 MG) BY MOUTH TWICE DAILY 180 tablet 1   metFORMIN (GLUCOPHAGE) 500 MG tablet Take 500 mg by mouth 2 (two) times daily with a meal.     metoprolol succinate (TOPROL-XL) 25 MG 24 hr tablet Take 0.5 tablets (12.5 mg total) by mouth daily. 45 tablet 3   Multiple Vitamin (MULTIVITAMIN WITH MINERALS) TABS tablet Take 1 tablet by mouth daily.     ONETOUCH VERIO test strip 1 each by Other route in the morning, at noon, and at bedtime.   3   rosuvastatin (CRESTOR) 20 MG tablet TAKE 1 TABLET(20 MG) BY MOUTH DAILY 90 tablet 3   tamsulosin (FLOMAX) 0.4 MG CAPS capsule Take 1 capsule (0.4 mg total) by mouth daily. (Patient taking differently: Take 0.4 mg by mouth at bedtime.) 30 capsule 0   TOUJEO SOLOSTAR 300 UNIT/ML Solostar Pen Inject 15 Units into the skin daily.     traMADol (ULTRAM) 50 MG tablet Take 1 tablet (50 mg total) by mouth every 6 (six) hours as needed. 6 tablet 0   No current facility-administered medications on file prior to visit.    Allergies:   Allergies  Allergen Reactions   Lipitor [Atorvastatin] Other (See Comments)    Muscle pain   Sitagliptin     Myalgias    Physical Exam General: well developed, well nourished elderly Caucasian male, seated, in no evident distress Head: head normocephalic and atraumatic.   Neck: supple with no carotid or supraclavicular bruits Cardiovascular: regular rate and rhythm, no murmurs Musculoskeletal: no deformity Skin:  no rash/petichiae Vascular:  Normal pulses all extremities  Neurologic Exam Mental Status: Awake and fully alert. Oriented to place and time. Recent and remote memory diminished. Attention span, concentration and fund of knowledge poor mood and affect appropriate.  Mini-Mental status exam scored 25/30  with deficits in recall, orientation attention  calculation reading and writing.  Clock drawing 4/4.  Able to copy intersecting pentagons.  Able to name only 8 animals which can walk on 4 legs. Cranial Nerves: Fundoscopic exam reveals sharp disc margins. Pupils equal, briskly reactive to light. Extraocular movements full without nystagmus. Visual fields full to confrontation. Hearing significantly diminished bilaterally facial sensation intact. Face, tongue, palate moves normally and symmetrically.  Motor: Normal bulk and tone. Normal strength in all tested extremity muscles except mild weakness of bilateral grip and hip flexors.. Sensory.: intact to touch , pinprick , position and vibratory sensation.  Coordination: Rapid alternating movements normal in all extremities. Finger-to-nose and heel-to-shin performed accurately bilaterally. Gait and Station: Arises from chair with slight difficulty. Stance is stooped l. Gait demonstrates normal stride length and only slight imbalance .  Not able to heel, toe and tandem walk    Reflexes: 1+ and symmetric. Toes downgoing.      MMSE - Mini Mental State Exam 09/23/2021 03/13/2021 12/06/2020  Orientation to time '4 3 4  '$ Orientation to Place '5 4 5  '$ Registration '3 3 3  '$ Attention/ Calculation '2 5 2  '$ Recall '2 3 1  '$ Language- name 2 objects '2 2 2  '$ Language- repeat 1 1 0  Language- follow 3 step command '3 3 3  '$ Language- read & follow direction '1 1 1  '$ Write a sentence 1 1 0  Copy design '1 1 1  '$ Total score '25 27 22      '$ ASSESSMENT: 76 year old Caucasian male with cognitive deterioration and memory loss for the last for 5 months following cardiac surgery likely due to mild vascular dementia.  He has shown some improvement on starting Namenda and vitamin B12 replacement history of remote right pontine infarct in 2013 and suspect left frontal and right parietal infarcts following cardiac surgery in December 2021.  Vascular risk factors of coronary artery disease, left ventricular thrombus, perioperative atrial  fibrillation, diabetes, hypertension, hyperlipidemia and age     PLAN: I had a long discussion with the patient, his wife and daughter regarding his memory loss and cognitive impairment which appears to be stable and he seems to be tolerating the current dose of Namenda well without side effects.  Continue Namenda 10 mg twice daily.  Continue vitamin B12 replacement as per primary physician .  Continue participation in cognitively challenging activities like solving crossword puzzles, playing bridge and sodoku.  We also discussed memory compensation strategies.  He will also encouraged to increase his physical activity and to walk or exercise every day as tolerated.  Return for follow-up in the future in 6 months with my nurse practitioner Janett Billow or call earlier if necessary. Greater than 50% time during this 40-minute  visit was spent in counseling and coordination of care about is memory loss and vascular dementia as well as discussion about stroke prevention and treatment of dementia and answering questions Antony Contras, MD Note: This document was prepared with digital dictation and possible smart phrase technology. Any transcriptional errors that result from this process are unintentional.

## 2021-09-23 NOTE — Patient Instructions (Signed)
I had a long discussion with the patient, his wife and daughter regarding his memory loss and cognitive impairment which appears to be stable and he seems to be tolerating the current dose of Namenda well without side effects.  Continue Namenda 10 mg twice daily.  Continue vitamin B12 replacement as per primary physician .  Continue participation in cognitively challenging activities like solving crossword puzzles, playing bridge and sodoku.  We also discussed memory compensation strategies.  He will also encouraged to increase his physical activity and to walk or exercise every day as tolerated.  Return for follow-up in the future in 6 months with my nurse practitioner Janett Billow or call earlier if necessary. ?

## 2021-09-24 ENCOUNTER — Other Ambulatory Visit: Payer: Self-pay | Admitting: Neurology

## 2021-10-02 DIAGNOSIS — R351 Nocturia: Secondary | ICD-10-CM | POA: Diagnosis not present

## 2021-10-02 DIAGNOSIS — N401 Enlarged prostate with lower urinary tract symptoms: Secondary | ICD-10-CM | POA: Diagnosis not present

## 2021-10-02 DIAGNOSIS — R3915 Urgency of urination: Secondary | ICD-10-CM | POA: Diagnosis not present

## 2021-10-15 DIAGNOSIS — Z794 Long term (current) use of insulin: Secondary | ICD-10-CM | POA: Diagnosis not present

## 2021-10-15 DIAGNOSIS — E1129 Type 2 diabetes mellitus with other diabetic kidney complication: Secondary | ICD-10-CM | POA: Diagnosis not present

## 2021-10-15 DIAGNOSIS — I129 Hypertensive chronic kidney disease with stage 1 through stage 4 chronic kidney disease, or unspecified chronic kidney disease: Secondary | ICD-10-CM | POA: Diagnosis not present

## 2021-10-15 DIAGNOSIS — N1831 Chronic kidney disease, stage 3a: Secondary | ICD-10-CM | POA: Diagnosis not present

## 2021-10-15 DIAGNOSIS — E785 Hyperlipidemia, unspecified: Secondary | ICD-10-CM | POA: Diagnosis not present

## 2021-10-22 ENCOUNTER — Ambulatory Visit: Payer: Self-pay

## 2021-10-24 ENCOUNTER — Other Ambulatory Visit: Payer: Self-pay

## 2021-10-24 NOTE — Patient Outreach (Signed)
[facility]) Care Management ? ?10/24/2021 ? ?[place] ?1945/12/01 ?[id number] ? ? ?Telephone call to spouse [name].  She reports that patient doing good.  Saw report of A1c at 8.0.  Wife reports they continue to work on his diabetes diet.  Reiterated diabetes management importance. No concerns.   ? ?Care Plan : RN Care Manager Plan of Care  ?Updates made by Jon Billings, RN since 10/24/2021 12:00 AM  ?  ? ?Problem: Chronic Care Management and care coordintation daibetes and sepsis   ?Priority: High  ?  ? ?Long-Range Goal: Development of plan of care for management of diabetes   ?Start Date: 05/23/2021  ?Expected End Date: 07/20/2022  ?This Visit's Progress: On track  ?Recent Progress: On track  ?Priority: High  ?Note:   ?Current Barriers:  ?Chronic Disease Management support and education needs related to DMII ? ?RNCM Clinical Goal(s):  ?Patient will verbalize understanding of plan for management of DMII ?verbalize basic understanding of  DMII disease process and self health management plan Diabetes ?take all medications exactly as prescribed and will call provider for medication related questions ?continue to work with RN Care Manager to address care management and care coordination needs related to  DMII through collaboration with RN Care manager, provider, and care team.  ? ?Interventions: ?Provided education to patient about basic DM disease process ?Inter-disciplinary care team collaboration (see longitudinal plan of care) ?Evaluation of current treatment plan related to  self management and patient's adherence to plan as established by provider ?Reviewed scheduled/upcoming provider appointments including PCP  ? ? ?Diabetes Interventions: ?Assessed patient's understanding of A1c goal: <7% ?Reviewed importance of limiting carbs such as rice, potatoes, breads, and pastas. ?Provided education to patient about basic DM disease process ?Discussed plans with patient for ongoing care management  follow up and provided patient with direct contact information for care management team ?Lab Results  ?Component Value Date  ? HGBA1C 8.8 (H) 05/08/2021  ?06/21/21 Patient blood sugars still  running high.  Wife reports patient eating thing he should not. Discussed importance of diabetes management.  ? ?10/24/21 Patient A1c 8.0.  Blood sugars up and down with this mornings sugar at 169.  Reiterated importance of continued diabetes management. ? ?Patient Goals/Self-Care Activities:  Diabetes ?check blood sugar at prescribed times: once daily ?take the blood sugar log to all doctor visits ?Patient will follow low carbohydrate diet ? ?Follow Up Plan:  Telephone follow up appointment with care management team member scheduled for:  June ?The patient has been provided with contact information for the care management team and has been advised to call with any health related questions or concerns.  ? ?  ? ?Plan: Follow-up: Patient agrees to Care Plan and Follow-up. ?Follow-up in June. ? ?Jone Baseman, RN, MSN ?[facility] Care Management ?Care Management Coordinator ?Direct Line [phone] ?Toll Free: [phone]  ?Fax: [phone] ? ?

## 2021-10-24 NOTE — Patient Instructions (Signed)
Patient Goals/Self-Care Activities:  Diabetes ?check blood sugar at prescribed times: once daily ?take the blood sugar log to all doctor visits ?Patient will follow low carbohydrate diet ?

## 2021-10-28 DIAGNOSIS — E1129 Type 2 diabetes mellitus with other diabetic kidney complication: Secondary | ICD-10-CM | POA: Diagnosis not present

## 2021-10-30 DIAGNOSIS — R350 Frequency of micturition: Secondary | ICD-10-CM | POA: Diagnosis not present

## 2021-10-30 DIAGNOSIS — R3912 Poor urinary stream: Secondary | ICD-10-CM | POA: Diagnosis not present

## 2021-10-30 DIAGNOSIS — R3915 Urgency of urination: Secondary | ICD-10-CM | POA: Diagnosis not present

## 2021-10-30 DIAGNOSIS — N401 Enlarged prostate with lower urinary tract symptoms: Secondary | ICD-10-CM | POA: Diagnosis not present

## 2021-11-06 NOTE — Progress Notes (Signed)
? ?Subjective:  ? ? Patient ID: Patrick Miranda, male    DOB: 17-Sep-1945, 76 y.o.   MRN: [id number] ? ?HPI ?M never smoker followed for OSA vasomotor rhinitis, complicated by history brainstem CVA, CAD, DM 2, HBP, anemia. ?NPSG 2007 AHI 31.3/ hr, CPAP to 9, weight 180 lbs. ? ?---------------------------------------------------------------------------------------- ? ?  ? ?11/05/20-  76 year old male never smoker followed for OSA, chronic rhinitis, complicated by hx Brainstem CVA, CAD/Ischemic CM/ CABG,  DM 2, HBP, anemia, CKD, Laparotomy for Small Bowel Perforation Feb, 2022, ?CPAP auto 7-13/ [employer]    AirSense 10 AutoSet ?Download-compliance 97%, AHI 8.8/ hr   Moderately high leak ?Body weight today-146 lbs ?covid vax- ?Flu vax- ?-----Triple bypass in Dec, diagnosed with dementia  ?Patient and wife state that patient is doing good, machine working good and he is sleeping good. Wife states he does snore and sleep with mouth open with machine on.  ?Note moderate leak. Snores through open mouth with nasal pillows mask on.  ?Still sleeping ok.  ?CXR 10/03/20-  ?IMPRESSION: ?No active cardiopulmonary disease. ? ?11/07/21- 76 year old male never smoker followed for OSA, chronic rhinitis, complicated by hx Brainstem CVA, Dementia, CAD/Ischemic CM/ CABG,  DM 2, HBP, anemia, CKD, Laparotomy for Small Bowel Perforation Feb, 2022, ?CPAP auto 7-13/ [employer] [facility] in [place] 10 AutoSet ?Download-compliance 100%, AHI 7.9/ hr [facility] ?Body weight today-178 lbs ?covid vax-4 [employer] ?Flu vax-had ?-----Patient and his wife states that he is having a really hard time finding a mask that works for him.  ?Download reviewed. Wife confirms he use CPAP every night. He ffinds it hard to keep mask on- complains of mask fit. Discussed.  ?Deenies significant new cardiac events. ?CXR 05/18/21- 1V- ?IMPRESSION: ?No active disease. ? ? ?ROS-see HPI   + = positive ?Constitutional:    weight loss, night sweats,  fevers, chills, + fatigue, lassitude. ?HEENT:    headaches, difficulty swallowing, tooth/dental problems, sore throat,  ?     sneezing, itching, ear ache, nasal congestion, post nasal drip, snoring ?CV:    chest pain, orthopnea, PND, swelling in lower extremities, anasarca,                                   ?                   dizziness, palpitations ?Resp:   shortness of breath with exertion or at rest.   ?             productive cough,   non-productive cough, coughing up of blood.   ?           change in color of mucus.  wheezing.   ?Skin:    rash or lesions. ?GI:  No-   heartburn, indigestion, abdominal pain, nausea, vomiting, ?GU:  ?MS:   joint pain, stiffness, ?Neuro-     nothing unusual ?Psych:  change in mood or affect.  depression or anxiety.   memory loss.   ?   ?Objective:  ? Physical Exam ?OBJ- Physical Exam ?General- Alert, Oriented, Affect-appropriate, Distress- none acute, trim,  ?Skin- rash-none, lesions- none, excoriation- none ?Lymphadenopathy- none ?Head- atraumatic ?           Eyes- Gross vision intact, PERRLA, conjunctivae and secretions clear ?           Ears- Hearing, canals-normal ?  Nose- + small pressure irritation right nostril, no-Septal dev, mucus, polyps, erosion, perforation  ?           Throat- Mallampati II-III , mucosa clear , drainage- none, tonsils- atrophic,hoarse ?Neck- flexible , trachea midline, no stridor , thyroid nl, carotid no bruit ?Chest - symmetrical excursion , unlabored ?          Heart/CV- RRR , no murmur , no gallop  , no rub, nl s1 s2 ?                          - JVD- none , edema- none, stasis changes- none, varices- none ?          Lung- clear to P&A, wheeze- none, cough-none , dullness-none, rub- none ?          Chest wall-  ?Abd-  ?Br/ Gen/ Rectal- Not done, not indicated ?Extrem- cyanosis- none, clubbing, none, atrophy- none, strength- nl ?Neuro- grossly intact to observation ? ?Assessment & Plan:  ? ?

## 2021-11-07 ENCOUNTER — Encounter: Payer: Self-pay | Admitting: Internal Medicine

## 2021-11-07 ENCOUNTER — Ambulatory Visit: Payer: Medicare HMO | Admitting: Internal Medicine

## 2021-11-07 VITALS — BP 120/64 | HR 79 | Temp 98.1°F | Ht 70.5 in | Wt 178.8 lb

## 2021-11-07 DIAGNOSIS — I2510 Atherosclerotic heart disease of native coronary artery without angina pectoris: Secondary | ICD-10-CM

## 2021-11-07 DIAGNOSIS — Z9989 Dependence on other enabling machines and devices: Secondary | ICD-10-CM

## 2021-11-07 DIAGNOSIS — G4733 Obstructive sleep apnea (adult) (pediatric): Secondary | ICD-10-CM

## 2021-11-07 NOTE — Patient Instructions (Signed)
We will ask your DME company [employer] to add AirView or an SD card so we can tell what the machine is doing. For now we will continue auto 7-13 ? ?Order- please schedule for mask fitting at [place] ? ?Please call if e can help ?

## 2021-11-14 DIAGNOSIS — L570 Actinic keratosis: Secondary | ICD-10-CM | POA: Diagnosis not present

## 2021-11-14 DIAGNOSIS — D485 Neoplasm of uncertain behavior of skin: Secondary | ICD-10-CM | POA: Diagnosis not present

## 2021-12-01 ENCOUNTER — Encounter: Payer: Self-pay | Admitting: Internal Medicine

## 2021-12-01 NOTE — Assessment & Plan Note (Signed)
Continues to work with cardiology.  ?

## 2021-12-01 NOTE — Assessment & Plan Note (Signed)
Some residual central apneas, reflecting his cardiovascular disease.  ?Plan- continue auto 7-13, refer for mask fitting ?

## 2021-12-03 DIAGNOSIS — I129 Hypertensive chronic kidney disease with stage 1 through stage 4 chronic kidney disease, or unspecified chronic kidney disease: Secondary | ICD-10-CM | POA: Diagnosis not present

## 2021-12-03 DIAGNOSIS — J069 Acute upper respiratory infection, unspecified: Secondary | ICD-10-CM | POA: Diagnosis not present

## 2021-12-03 DIAGNOSIS — I5022 Chronic systolic (congestive) heart failure: Secondary | ICD-10-CM | POA: Diagnosis not present

## 2021-12-03 DIAGNOSIS — N1831 Chronic kidney disease, stage 3a: Secondary | ICD-10-CM | POA: Diagnosis not present

## 2021-12-03 DIAGNOSIS — R051 Acute cough: Secondary | ICD-10-CM | POA: Diagnosis not present

## 2021-12-09 ENCOUNTER — Ambulatory Visit: Payer: Medicare HMO | Admitting: Cardiology

## 2021-12-09 ENCOUNTER — Encounter: Payer: Self-pay | Admitting: Cardiology

## 2021-12-09 VITALS — BP 124/64 | HR 72 | Ht 70.5 in | Wt 179.0 lb

## 2021-12-09 DIAGNOSIS — I25119 Atherosclerotic heart disease of native coronary artery with unspecified angina pectoris: Secondary | ICD-10-CM

## 2021-12-09 DIAGNOSIS — I5020 Unspecified systolic (congestive) heart failure: Secondary | ICD-10-CM

## 2021-12-09 DIAGNOSIS — I480 Paroxysmal atrial fibrillation: Secondary | ICD-10-CM | POA: Diagnosis not present

## 2021-12-09 DIAGNOSIS — I255 Ischemic cardiomyopathy: Secondary | ICD-10-CM | POA: Diagnosis not present

## 2021-12-09 NOTE — Patient Instructions (Signed)
Medication Instructions:   STOP Aspirin   Labwork: None today  Testing/Procedures: None today  Follow-Up: 6 months  Any Other Special Instructions Will Be Listed Below (If Applicable).  If you need a refill on your cardiac medications before your next appointment, please call your pharmacy.

## 2021-12-09 NOTE — Progress Notes (Signed)
Cardiology Office Note  Date: 12/09/2021   ID: Patrick Miranda, DOB 02/23/1946, MRN [id number]  PCP:  Burnard Bunting, MD  Cardiologist:  Rozann Lesches, MD Electrophysiologist:  None   Chief Complaint  Patient presents with   Cardiac follow-up    History of Present Illness: Patrick Miranda is a 76 y.o. male last seen in November 2022 by Ms. Strader PA-C.  He is here today with his wife for a follow-up visit.  He does not report any angina symptoms.  He is fairly sedentary, his wife states that he sleeps a lot even during the daytime.  I reviewed his medications which are stable from a cardiac perspective.  We discussed stopping aspirin at this point since he is on Eliquis.  Last echocardiogram in September 2022 showed no residual LV mural thrombus.  LVEF was approximately 45% at that time.  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 s/p CABG in 06/2020 with LIMA-LAD, SVG-D1 and SVG-RCA   COVID-07 Dec 2019   Dementia 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[place])    Left ventricular mural thrombus    NSTEMI (non-ST elevated myocardial infarction) ([place])    OSA on CPAP    Pulmonary emboli [facility])    May 2021   Skin cancer    Type 2 diabetes mellitus ([place])     Past Surgical History:  Procedure Laterality Date   COLON SURGERY     COLONOSCOPY W/ POLYPECTOMY     CORONARY ARTERY BYPASS GRAFT N/A 07/09/2020   Procedure: CORONARY ARTERY BYPASS GRAFTING (CABG) TIMES THREE , USING LEFT INTERNAL MAMMARY ARTERY AND RIGHT LEG GREATER SAPHENOUS VEIN HARVESTED ENDOSCOPICALLY;  Surgeon: Ivin Poot, MD;  Location: [place];  Service: Open Heart Surgery;  Laterality: N/A;   LEFT HEART CATH AND CORONARY ANGIOGRAPHY N/A  07/04/2020   Procedure: LEFT HEART CATH AND CORONARY ANGIOGRAPHY;  Surgeon: Troy Sine, MD;  Location: [place] [facility];  Service: Cardiovascular;  Laterality: N/A;   TEE WITHOUT CARDIOVERSION N/A 07/09/2020   Procedure: TRANSESOPHAGEAL ECHOCARDIOGRAM (TEE);  Surgeon: Prescott Gum, Collier Salina, MD;  Location: [place];  Service: Open Heart Surgery;  Laterality: N/A;   TOTAL KNEE ARTHROPLASTY Left 04/22/2013   Procedure: TOTAL KNEE ARTHROPLASTY- left;  Surgeon: Augustin Schooling, MD;  Location: [place];  Service: Orthopedics;  Laterality: Left;  with femoral block   TOTAL KNEE ARTHROPLASTY Right 01/06/2014   Procedure: RIGHT TOTAL KNEE ARTHROPLASTY;  Surgeon: Augustin Schooling, MD;  Location: [place];  Service: Orthopedics;  Laterality: Right;   TRANSURETHRAL RESECTION OF PROSTATE N/A 05/14/2021   Procedure: TRANSURETHRAL RESECTION OF THE PROSTATE (TURP);  Surgeon: Robley Fries, MD;  Location: [facility];  Service: Urology;  Laterality: N/A;  90 MINS    Current Outpatient Medications  Medication Sig Dispense Refill   acetaminophen (TYLENOL) 325 MG tablet Take 2 tablets (650 mg total) by mouth every 6 (six) hours as needed for mild pain or headache (or Fever >/= 101).     apixaban (ELIQUIS) 5 MG TABS tablet TAKE 1 TABLET(5 MG) BY MOUTH TWICE DAILY 60 tablet 5  BD PEN NEEDLE NANO U/F 32G X 4 MM MISC 1 each by Other route daily.      Biotin 5000 MCG TABS Take 5,000 mcg by mouth daily.     Continuous Blood Gluc Sensor (FREESTYLE LIBRE 2 SENSOR) MISC See admin instructions.     Cyanocobalamin (B-12) 5000 MCG SUBL Place 5,000 mcg under the tongue daily.     Insulin Aspart (NOVOLOG FLEXPEN [place]) Inject 0-15 Units into the skin 3 (three) times daily before meals. Dose per sliding scale     losartan (COZAAR) 25 MG tablet TAKE 1/2 TABLET(12.5 MG) BY MOUTH DAILY 45 tablet 1   memantine (NAMENDA) 10 MG tablet TAKE 1 TABLET(10 MG) BY MOUTH TWICE DAILY 180 tablet 1   metFORMIN (GLUCOPHAGE) 500 MG tablet Take 500 mg by  mouth 2 (two) times daily with a meal.     metoprolol succinate (TOPROL-XL) 25 MG 24 hr tablet Take 0.5 tablets (12.5 mg total) by mouth daily. 45 tablet 3   Multiple Vitamin (MULTIVITAMIN WITH MINERALS) TABS tablet Take 1 tablet by mouth daily.     ONETOUCH VERIO test strip 1 each by Other route in the morning, at noon, and at bedtime.   3   rosuvastatin (CRESTOR) 20 MG tablet TAKE 1 TABLET(20 MG) BY MOUTH DAILY 90 tablet 3   TOUJEO SOLOSTAR 300 UNIT/ML Solostar Pen Inject 15 Units into the skin daily.     No current facility-administered medications for this visit.   Allergies:  Lipitor [atorvastatin] and Sitagliptin   ROS: No palpitations or syncope.  Physical Exam: VS:  BP 124/64   Pulse 72   Ht 5' 10.5" (1.791 m)   Wt 179 lb (81.2 kg)   SpO2 96%   BMI 25.32 kg/m , BMI Body mass index is 25.32 kg/m.  Wt Readings from Last 3 Encounters:  12/09/21 179 lb (81.2 kg)  11/07/21 178 lb 12.8 oz (81.1 kg)  09/23/21 177 lb (80.3 kg)    General: Patient appears comfortable at rest. HEENT: Conjunctiva and lids normal. Neck: Supple, no elevated JVP or carotid bruits, no thyromegaly. Lungs: Clear to auscultation, nonlabored breathing at rest. Cardiac: Regular rate and rhythm, no S3 or significant systolic murmur. Extremities: No pitting edema.  ECG:  An ECG dated 05/18/2021 was personally reviewed today and demonstrated:  Sinus bradycardia with nonspecific T wave changes.  Recent Labwork: 05/18/2021: ALT 10; AST 33; Hemoglobin 9.9; Platelets 306 05/21/2021: BUN 29; Creatinine, Ser 1.30; Potassium 3.5; Sodium 138     Component Value Date/Time   CHOL 65 09/09/2020 0402   TRIG 94 09/09/2020 0402   HDL 21 (L) 09/09/2020 0402   CHOLHDL 3.1 09/09/2020 0402   VLDL 19 09/09/2020 0402   LDLCALC 25 09/09/2020 0402    Other Studies Reviewed Today:  Echocardiogram 03/27/2021:  1. Akinesis of the inferior/inferoseptal base with aneurysmal dilitation.  Apical hypokinesis. . Left  ventricular ejection fraction, by estimation,  is 45%. The left ventricle has mildly decreased function. The left  ventricle demonstrates regional wall  motion abnormalities (see scoring diagram/findings for description). Left  ventricular diastolic parameters are consistent with Grade I diastolic  dysfunction (impaired relaxation).   2. Right ventricular systolic function is normal. The right ventricular  size is normal. There is normal pulmonary artery systolic pressure.   3. The mitral valve is normal in structure. Trivial mitral valve  regurgitation. No evidence of mitral stenosis.   4. The aortic valve has an indeterminant number of cusps. Aortic valve  regurgitation is  mild. No aortic stenosis is present.   Assessment and Plan:  1.  HFrEF with ischemic cardiomyopathy and LVEF approximately 45%.  Previously documented LV mural thrombus no longer evident and he continues on Eliquis with history of PAF and prior stroke as well.  No active angina with fairly sedentary status.  Continue Toprol-XL and Cozaar.  2.  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  Stopping aspirin at this point.  Continue Crestor.  3.  Paroxysmal atrial fibrillation with CHA2DS2-VASc score of 7.  Continue Eliquis for stroke prophylaxis.  Medication Adjustments/Labs and Tests Ordered: Current medicines are reviewed at length with the patient today.  Concerns regarding medicines are outlined above.   Tests Ordered: No orders of the defined types were placed in this encounter.   Medication Changes: No orders of the defined types were placed in this encounter.   Disposition:  Follow up  6 months.  Signed, Satira Sark, MD, [facility] 12/09/2021 2:44 PM    [facility] [address] [place], [place] [address] Phone: [phone]; Fax: [phone]

## 2021-12-18 ENCOUNTER — Other Ambulatory Visit: Payer: Self-pay | Admitting: Cardiology

## 2021-12-23 ENCOUNTER — Other Ambulatory Visit: Payer: Self-pay

## 2021-12-23 NOTE — Patient Instructions (Signed)
Patient Goals/Self-Care Activities:  Diabetes check blood sugar at prescribed times: once daily take the blood sugar log to all doctor visits Patient will follow low carbohydrate diet

## 2021-12-23 NOTE — Patient Outreach (Signed)
Patrick Miranda) Care Management  12/23/2021  Patrick Miranda 02-07-1946 [id number]   Telephone call to spouse Patrick Miranda.  She states that patient is doing pretty good.  Patient sugars up and down due to him eating when she is not ware. Discussed importance of at least at mealtime providing a diabetic diet low in carbohydrates.  She verbalized understanding.  No concerns.    Care Plan : RN Care Manager Plan of Care  Updates made by Patrick Billings, RN since 12/23/2021 12:00 AM     Problem: Chronic Care Management and care coordintation daibetes and sepsis   Priority: High     Long-Range Goal: Development of plan of care for management of diabetes   Start Date: 05/23/2021  Expected End Date: 07/20/2022  This Visit's Progress: On track  Recent Progress: On track  Priority: High  Note:   Current Barriers:  Chronic Disease Management support and education needs related to DMII  RNCM Clinical Goal(s):  Patient will verbalize understanding of plan for management of DMII verbalize basic understanding of  DMII disease process and self health management plan Diabetes take all medications exactly as prescribed and will call provider for medication related questions continue to work with RN Care Manager to address care management and care coordination needs related to  DMII through collaboration with RN Care manager, provider, and care team.   Interventions: Provided education to patient about basic DM disease process Inter-disciplinary care team collaboration (see longitudinal plan of care) Evaluation of current treatment plan related to  self management and patient's adherence to plan as established by provider Reviewed scheduled/upcoming provider appointments including PCP    Diabetes Interventions: Assessed patient's understanding of A1c goal: <7% Reviewed importance of limiting carbs such as rice, potatoes, breads, and pastas. Provided education to patient about basic DM  disease process Discussed plans with patient for ongoing care management follow up and provided patient with direct contact information for care management team Lab Results  Component Value Date   HGBA1C 8.8 (H) 05/08/2021  06/21/21 Patient blood sugars still  running high.  Wife reports patient eating thing he should not. Discussed importance of diabetes management.   10/24/21 Patient A1c 8.0.  Blood sugars up and down with this mornings sugar at 169.  Reiterated importance of continued diabetes management.  12/23/21 Patient doing well per spouse. She reports sugars up and down due to eating habits.  Blood  sugar this an 188.  Encouraged continued diabetes management.   Patient Goals/Self-Care Activities:  Diabetes check blood sugar at prescribed times: once daily take the blood sugar log to all doctor visits Patient will follow low carbohydrate diet  Follow Up Plan:  Telephone follow up appointment with care management team member scheduled for:  August The patient has been provided with contact information for the care management team and has been advised to call with any health related questions or concerns.       Plan: Follow-up: Patient agrees to Care Plan and Follow-up. Follow-up in August.   Patrick Miranda J Daphnie Venturini, RN, MSN [place] [facility] Care Management Coordinator Direct Line [phone] Toll Free: [phone]  Fax: [phone]

## 2021-12-30 ENCOUNTER — Encounter: Payer: Self-pay | Admitting: Podiatry

## 2021-12-30 ENCOUNTER — Ambulatory Visit: Payer: Medicare HMO | Admitting: Podiatry

## 2021-12-30 ENCOUNTER — Ambulatory Visit (HOSPITAL_BASED_OUTPATIENT_CLINIC_OR_DEPARTMENT_OTHER): Payer: Medicare HMO | Attending: Internal Medicine | Admitting: Internal Medicine

## 2021-12-30 DIAGNOSIS — B351 Tinea unguium: Secondary | ICD-10-CM | POA: Diagnosis not present

## 2021-12-30 DIAGNOSIS — E119 Type 2 diabetes mellitus without complications: Secondary | ICD-10-CM

## 2021-12-30 DIAGNOSIS — M79676 Pain in unspecified toe(s): Secondary | ICD-10-CM | POA: Diagnosis not present

## 2021-12-30 DIAGNOSIS — D689 Coagulation defect, unspecified: Secondary | ICD-10-CM

## 2021-12-30 DIAGNOSIS — G4733 Obstructive sleep apnea (adult) (pediatric): Secondary | ICD-10-CM

## 2021-12-30 NOTE — Progress Notes (Signed)
This patient returns to my office for at risk foot care.  This patient requires this care by a professional since this patient will be at risk due to having diabetes and CKD.and coagulation defect..  Patient is taking plavix and eliquis causing coagulation defect. This patient is unable to cut nails himself since the patient cannot reach his nails.These nails are painful walking and wearing shoes.  This patient presents for at risk foot care today.  General Appearance  Alert, conversant and in no acute stress.  Vascular  Dorsalis pedis and posterior tibial  pulses are palpable  bilaterally.  Capillary return is within normal limits  bilaterally. Temperature is within normal limits  bilaterally.  Neurologic  Senn-Weinstein monofilament wire test within normal limits  bilaterally. Muscle power within normal limits bilaterally.  Nails Thick disfigured discolored nails with subungual debris  from hallux to fifth toes bilaterally. No evidence of bacterial infection or drainage bilaterally.  Asymptomatic left hallux nail.  Orthopedic  No limitations of motion  feet .  No crepitus or effusions noted.  HAV  B/L.  Overlapping second toe left foot.  Skin  normotropic skin with no porokeratosis noted bilaterally.  No signs of infections or ulcers noted.   Asymptomatic pinch callus.  Onychomycosis  Pain in right toes  Pain in left toes  Consent was obtained for treatment procedures.   Mechanical debridement of nails 1-5  bilaterally performed with a nail nipper.  Filed with dremel without incident.    Return office visit 4  months                  Told patient to return for periodic foot care and evaluation due to potential at risk complications.   Gehrig Patras DPM  

## 2022-01-01 DIAGNOSIS — R350 Frequency of micturition: Secondary | ICD-10-CM | POA: Diagnosis not present

## 2022-01-01 DIAGNOSIS — R3915 Urgency of urination: Secondary | ICD-10-CM | POA: Diagnosis not present

## 2022-01-01 DIAGNOSIS — N401 Enlarged prostate with lower urinary tract symptoms: Secondary | ICD-10-CM | POA: Diagnosis not present

## 2022-01-08 ENCOUNTER — Telehealth: Payer: Self-pay | Admitting: Internal Medicine

## 2022-01-09 NOTE — Telephone Encounter (Signed)
Called patients wife back and informed her that last year in Patrick Miranda orders were sent to [place] in regards to his cpap machine. I informed her that we were unable to tell her a date of when her husband received his machine. Wife states she understood and will contact Psychiatrist. Nothing further needed

## 2022-01-16 DIAGNOSIS — I129 Hypertensive chronic kidney disease with stage 1 through stage 4 chronic kidney disease, or unspecified chronic kidney disease: Secondary | ICD-10-CM | POA: Diagnosis not present

## 2022-01-16 DIAGNOSIS — Z794 Long term (current) use of insulin: Secondary | ICD-10-CM | POA: Diagnosis not present

## 2022-01-16 DIAGNOSIS — N1831 Chronic kidney disease, stage 3a: Secondary | ICD-10-CM | POA: Diagnosis not present

## 2022-01-16 DIAGNOSIS — I2510 Atherosclerotic heart disease of native coronary artery without angina pectoris: Secondary | ICD-10-CM | POA: Diagnosis not present

## 2022-01-16 DIAGNOSIS — E1129 Type 2 diabetes mellitus with other diabetic kidney complication: Secondary | ICD-10-CM | POA: Diagnosis not present

## 2022-02-18 ENCOUNTER — Telehealth: Payer: Self-pay | Admitting: Cardiology

## 2022-02-18 MED ORDER — APIXABAN 5 MG PO TABS: ORAL_TABLET | ORAL | 5 refills | Status: DC

## 2022-02-18 NOTE — Telephone Encounter (Signed)
*  STAT* If patient is at the pharmacy, call can be transferred to refill team.   1. Which medications need to be refilled? (please list name of each medication and dose if known) apixaban (ELIQUIS) 5 MG TABS tablet  2. Which pharmacy/location (including street and city if local pharmacy) is medication to be sent to? [facility] [place] [address]  3. Do they need a 30 day or 90 day supply? [place]

## 2022-02-18 NOTE — Telephone Encounter (Signed)
Prescription refill request for Eliquis received. Indication: Atrial Fib Last office visit: 12/09/21  Myles Gip MD Scr: 1.30 on 05/21/21 Age: 76 Weight: 81.2kg  Based on above findings Eliquis '5mg'$  twice daily is the appropriate dose.  Refill approved.

## 2022-02-23 ENCOUNTER — Other Ambulatory Visit: Payer: Self-pay | Admitting: Cardiology

## 2022-02-25 MED ORDER — APIXABAN 5 MG PO TABS: ORAL_TABLET | ORAL | 1 refills | Status: AC

## 2022-02-25 NOTE — Addendum Note (Signed)
Addended by: Malen Gauze on: 02/25/2022 10:16 AM   Modules accepted: Orders

## 2022-02-25 NOTE — Telephone Encounter (Signed)
  Pt's wife said, the pharmacy told them they did not receive the eliquis refill. She requested if we can resend refill

## 2022-03-03 ENCOUNTER — Other Ambulatory Visit: Payer: Self-pay

## 2022-03-03 NOTE — Patient Outreach (Signed)
[facility]) Care Management  03/03/2022  [place] 1945-09-11 [id number]   Telephone call to spouse for follow up. she cannot talk right now.  Advised that CM would call back another time. She is agreeable.    Plan: RN CM will attempt again in the month of August.    Tamra Koos J Adrian Specht, RN, MSN [place] Management Care Management Coordinator Direct Line [phone] Toll Free: [phone]  Fax: [phone]

## 2022-03-05 ENCOUNTER — Other Ambulatory Visit: Payer: Self-pay

## 2022-03-05 NOTE — Patient Outreach (Signed)
[facility]) Care Management  03/05/2022  [place] 1946-06-25 [id number]   Patient meeting goals.  Closing case.  Wife verbalized understanding.  No concerns.    Care Plan : RN Care Manager Plan of Care  Updates made by Jon Billings, RN since 03/05/2022 12:00 AM  Completed 03/05/2022   Problem: Chronic Care Management and care coordintation daibetes and sepsis Resolved 03/05/2022  Priority: High     Long-Range Goal: Development of plan of care for management of diabetes Completed 03/05/2022  Start Date: 05/23/2021  Expected End Date: 07/20/2022  Recent Progress: On track  Priority: High  Note:   Current Barriers:  Chronic Disease Management support and education needs related to DMII  RNCM Clinical Goal(s):  Patient will verbalize understanding of plan for management of DMII verbalize basic understanding of  DMII disease process and self health management plan Diabetes take all medications exactly as prescribed and will call provider for medication related questions continue to work with RN Care Manager to address care management and care coordination needs related to  DMII through collaboration with RN Care manager, provider, and care team.   Interventions: Provided education to patient about basic DM disease process Inter-disciplinary care team collaboration (see longitudinal plan of care) Evaluation of current treatment plan related to  self management and patient's adherence to plan as established by provider Reviewed scheduled/upcoming provider appointments including PCP    Diabetes Interventions: Assessed patient's understanding of A1c goal: <7% Reviewed importance of limiting carbs such as rice, potatoes, breads, and pastas. Provided education to patient about basic DM disease process Discussed plans with patient for ongoing care management follow up and provided patient with direct contact information for care management team Lab Results   Component Value Date   HGBA1C 8.8 (H) 05/08/2021  06/21/21 Patient blood sugars still  running high.  Wife reports patient eating thing he should not. Discussed importance of diabetes management.   10/24/21 Patient A1c 8.0.  Blood sugars up and down with this mornings sugar at 169.  Reiterated importance of continued diabetes management.  12/23/21 Patient doing well per spouse. She reports sugars up and down due to eating habits.  Blood  sugar this an 188.  Encouraged continued diabetes management.   03/05/22 Doing well.  Meeting goals.    Patient Goals/Self-Care Activities:  Diabetes check blood sugar at prescribed times: once daily take the blood sugar log to all doctor visits Patient will follow low carbohydrate diet  Follow Up Plan:  Closing case    Plan: Closing case.    Jone Baseman, RN, MSN [facility] Care Management Care Management Coordinator Direct Line [phone] Toll Free: [phone]  Fax: [phone]

## 2022-03-27 ENCOUNTER — Ambulatory Visit: Payer: Medicare HMO | Admitting: Adult Health

## 2022-03-27 ENCOUNTER — Encounter: Payer: Self-pay | Admitting: Adult Health

## 2022-03-27 VITALS — BP 149/71 | HR 78 | Ht 70.5 in | Wt 177.4 lb

## 2022-03-27 DIAGNOSIS — R413 Other amnesia: Secondary | ICD-10-CM

## 2022-03-27 DIAGNOSIS — F0150 Vascular dementia without behavioral disturbance: Secondary | ICD-10-CM

## 2022-03-27 MED ORDER — SERTRALINE HCL 25 MG PO TABS: 25.0000 mg | ORAL_TABLET | Freq: Every day | ORAL | 5 refills | Status: DC

## 2022-03-27 NOTE — Progress Notes (Signed)
[facility] [address] [place]. [place] [address] [phone]       OFFICE FOLLOW UP VISIT NOTE  [name] Date of Birth:  11/04/1945 Medical Record Number:  [id number]   Primary neurologist: Dr. Leonie Man Referring MD: Dr. Erlinda Hong Reason for Referral: Dementia   Chief Complaint  Patient presents with   Follow-up    Pt reports feeling fine. Wife wants to know what type of dementia he has and what stage and what to expect regarding his dementia. Room 3 with wife and daughter.      HPI:   Initial visit 12/06/2020 Dr. Lerry Paterson. Patrick Miranda is a 42 Caucasian male seen today for initial office consultation visit following hospital admission for confusion.  History is obtained from the patient and wife and review of electronic medical records and I personally reviewed available pertinent imaging films in PACS.  He has past medical history of right pontine stroke in 2013, coronary artery disease s/p CABG December 21, hypertension, hyperlipidemia, cardiomyopathy, left ventricular thrombus and pulmonary emboli.  Patient was admitted to [facility] in February 2022 for altered mental status for couple of weeks.  He had some odd behavior and was confused on multiple occasions.  CT scan of the head was showed suspected subacute left frontal and old right parietal infarcts.  MRI scan showed some T2 shine through but no acute infarct and dislocations.  2D echo showed ejection fraction of 40% with a kinesis of the inferior and inferior septal base with aneurysmal dilatation.  This was improved compared with previous echo from January 2022 when there was apical thrombus which was no longer seen.  EEG showed mild diffuse slowing suggestive of encephalopathy but no seizures.  LDL cholesterol 25 mg percent.  Hemoglobin A1c was 6.8.  Patient had been on Coumadin which she had a tough time taking and was switched to Eliquis which she is not tolerating much better without bruising  or bleeding.  Continues to have memory loss and cognitive impairment which is unchanged.  Patient not been evaluated for reversible causes of cognitive impairment and not been tried on medication like Aricept or Namenda yet.  Patient had a remote history of a right pontine stroke in 2013 and had only mild speech difficulties which resolved completely.  He had no other strokes due to his recent cardiac surgery in December.  He had transient postop A. fib as well and a left ventricular thrombus on the echo which has since resolved.  There is no family history of Alzheimer's.  Patient has no history of seizures or significant head injury with loss of consciousness.  Patient's cognitive impairment is mostly related to short-term memory and remembering recent information.  He still mostly independent in activities of daily living and can attend to his own bodily needs.  He does get frustrated easily but there is no agitation, violent behavior, delusions or hallucinations.  He has not exhibited any unsafe behavior and does not wander off.  His balance is poor and he does use a cane and he fell once in the bathroom a month ago does not fall on a regular basis.  Update 03/13/2021 Dr. Leonie Man; He returns for follow-up after last visit 3 months ago.  He is accompanied by his wife.   They feel his memory is slightly worse.  He does get confused off and on at times.  He is still mostly independent with activities of daily living but he needs constant reminders and repeated reminders  about recent staffing which he forgets.  He has not been driving.  Patient has been complaining of getting tired easily and hence does not walk a lot.  Wife feels at times when he is tired he can drag his feet.  He has had no falls or injuries.  There have been no delusions or hallucinations noted.  He does get occasionally agitated but there has been no violent behavior.  He continues to live with his wife and daughter and have somebody around him  all the time.  He did undergo lab work on 12/06/2020 which was significant for low vitamin B12 levels of 153 otherwise TSH, homocystine and RPR were normal.  EEG on 12/31/2020 was normal.  Patient had a fall and was seen in the ER on 02/17/2021 and had a CT scan of the head which showed mild generalized atrophy and bilateral lower cortical infarcts with no acute abnormality.  On Mini-Mental status exam today he actually scored 27 out of 30 which is an improvement from last visit when he had scored 52/30. Update 09/23/2021 Dr. Leonie Man: He returns for follow-up after last visit 6 months ago.  He is accompanied by his wife and daughter.  They feel he is doing about the same.  His memory and cognitive difficulties are unchanged.  He remains on Namenda 10 mg twice daily which is tolerating well without side effects.  Patient seems to sleep a lot.  Does not walk a lot for exercise.  He has not been regularly doing cognitively challenging activities.  He has not had any new health issues.  Remains on Eliquis which is tolerating well without bleeding or bruising.  Blood pressures well controlled today it is 119/74.  Sugars are also under good control.  He is tolerating Crestor well without muscle aches and pains.  He has no new complaints today.  On Mini-Mental status exam today scored 25/30 which is similar to 27/30 at last visit 6 months ago.  Family does not feel he has had any significant cognitive decline  Update 03/27/2022 JM: Patient returns for 46-monthmemory follow-up accompanied by his wife and daughter.   Patient believes memory has been stable but family believes memory has gradually declined since prior visit. Will misplace items, will forget where their home is, short term memory worsening, difficulty operating remote or cell phone at times. Sleeps throughout the night, can have occasional nocturia, frequent daytime napping, will get up to eat or go to the bathroom. He doesn't do any type of physical or mental  activity, wife tries to encourage him but he refuses. Does use his CPAP nightly. Appetite good.  Wife questions possible underlying depression that could be contributing.  Is scheduled next month for yearly physical with PCP.  Blood pressure not routinely monitored at home.  Glucose levels have been slightly elevated, past 7-day average 210, wife believes this is because he will forget that he just ate and then eat again.  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 s/p CABG in 06/2020 with LIMA-LAD, SVG-D1 and SVG-RCA   COVID-07 Dec 2019   D[facility] ([facility]    Depression  Dysrhythmia    Essential hypertension    Hernia, umbilical    HOH (hard of hearing)    Hyperlipidemia    Ischemic cardiomyopathy    Ischemic necrosis of small bowel (HCC)    Left ventricular mural thrombus    NSTEMI (non-ST elevated myocardial infarction) (HCC)    OSA on CPAP    Pulmonary emboli [facility])    May 2021   Skin cancer    Type 2 diabetes mellitus ([place])     Social History:  Social History   Socioeconomic History   Marital status: Married    Spouse name: [name]   Number of children: Not on file   Years of education: Not on file   Highest education level: Not on file  Occupational History   Not on file  Tobacco Use   Smoking status: Never   Smokeless tobacco: Never  Vaping Use   Vaping Use: Never used  Substance and Sexual Activity   Alcohol use: No   Drug use: No   Sexual activity: Not on file  Other Topics Concern   Not on file  Social History Narrative   Lives with wife at Daughter's home   Right Handed   Drinks very little caffeine daily   Social Determinants of Health   Financial Resource Strain: Not on file  Food  Insecurity: No Food Insecurity (06/21/2021)   Hunger Vital Sign    Worried About Running Out of Food in the Last Year: Never true    Ran Out of Food in the Last Year: Never true  Transportation Needs: No Transportation Needs (10/24/2021)   PRAPARE - Hydrologist (Medical): No    Lack of Transportation (Non-Medical): No  Physical Activity: Not on file  Stress: Not on file  Social Connections: Not on file  Intimate Partner Violence: Not on file    Medications:   Current Outpatient Medications on File Prior to Visit  Medication Sig Dispense Refill   acetaminophen (TYLENOL) 325 MG tablet Take 2 tablets (650 mg total) by mouth every 6 (six) hours as needed for mild pain or headache (or Fever >/= 101).     apixaban (ELIQUIS) 5 MG TABS tablet TAKE 1 TABLET(5 MG) BY MOUTH TWICE DAILY 180 tablet 1   BD PEN NEEDLE NANO U/F 32G X 4 MM MISC 1 each by Other route daily.      Biotin 5000 MCG TABS Take 5,000 mcg by mouth daily.     Continuous Blood Gluc Sensor (FREESTYLE LIBRE 2 SENSOR) MISC See admin instructions.     Cyanocobalamin (B-12) 5000 MCG SUBL Place 5,000 mcg under the tongue daily.     fenofibrate 160 MG tablet 1 capsule with a meal Orally Once a day     Insulin Aspart (NOVOLOG FLEXPEN [place]) Inject 0-15 Units into the skin 3 (three) times daily before meals. Dose per sliding scale     losartan (COZAAR) 25 MG tablet TAKE 1/2 TABLET(12.5 MG) BY MOUTH DAILY 45 tablet 1   memantine (NAMENDA) 10 MG tablet TAKE 1 TABLET(10 MG) BY MOUTH TWICE DAILY 180 tablet 1   metFORMIN (GLUCOPHAGE) 500 MG tablet Take 500 mg by mouth 2 (two) times daily with a meal.     metoprolol succinate (TOPROL-XL) 25 MG 24 hr tablet Take 0.5 tablets (12.5 mg total) by mouth daily. 45 tablet 3   Multiple Vitamin (MULTIVITAMIN WITH MINERALS) TABS tablet Take 1 tablet by mouth daily.     ONETOUCH VERIO test strip  1 each by Other route in the morning, at noon, and at bedtime.   3   rosuvastatin  (CRESTOR) 20 MG tablet TAKE 1 TABLET(20 MG) BY MOUTH DAILY 90 tablet 3   tamsulosin (FLOMAX) 0.4 MG CAPS capsule Take 0.4 mg by mouth daily.     TOUJEO SOLOSTAR 300 UNIT/ML Solostar Pen Inject 15 Units into the skin daily.     No current facility-administered medications on file prior to visit.    Allergies:   Allergies  Allergen Reactions   Lipitor [Atorvastatin] Other (See Comments)    Muscle pain   Sitagliptin     Myalgias Other reaction(s): myalgias    Physical Exam Today's Vitals   03/27/22 1440  BP: (!) 149/71  Pulse: 78  Weight: 177 lb 6 oz (80.5 kg)  Height: 5' 10.5" (1.791 m)   Body mass index is 25.09 kg/m.   General: well developed, well nourished elderly Caucasian male, seated, in no evident distress Head: head normocephalic and atraumatic.   Neck: supple with no carotid or supraclavicular bruits Cardiovascular: regular rate and rhythm, no murmurs Musculoskeletal: no deformity Skin:  no rash/petichiae Vascular:  Normal pulses all extremities  Neurologic Exam Mental Status: Awake and fully alert. Oriented to place and time. Recent and remote memory diminished. Attention span, concentration and fund of knowledge poor mood and affect appropriate.    03/27/2022    3:09 PM 09/23/2021    2:31 PM 03/13/2021    3:08 PM  MMSE - Mini Mental State Exam  Orientation to time '4 4 3  '$ Orientation to Place '5 5 4  '$ Registration '2 3 3  '$ Attention/ Calculation '1 2 5  '$ Recall '2 2 3  '$ Language- name 2 objects '2 2 2  '$ Language- repeat '1 1 1  '$ Language- follow 3 step command '3 3 3  '$ Language- read & follow direction '1 1 1  '$ Write a sentence '1 1 1  '$ Copy design '1 1 1  '$ Total score '23 25 27   '$ Cranial Nerves: Pupils equal, briskly reactive to light. Extraocular movements full without nystagmus. Visual fields full to confrontation. Hearing significantly diminished bilaterally facial sensation intact. Face, tongue, palate moves normally and symmetrically.  Motor: Normal bulk and tone.  Normal strength in all tested extremity muscles except mild weakness of bilateral grip and hip flexors.. Sensory.: intact to touch , pinprick , position and vibratory sensation.  Coordination: Rapid alternating movements normal in all extremities. Finger-to-nose and heel-to-shin performed accurately bilaterally. Gait and Station: Arises from chair with slight difficulty. Stance is stooped l. Gait demonstrates normal stride length and only slight imbalance with use of walking stick.  Not able to heel, toe and tandem walk    Reflexes: 1+ and symmetric. Toes downgoing.        ASSESSMENT/PLAN: 76 year old Caucasian male with cognitive deterioration and memory loss for the last  5 months following cardiac surgery likely due to mild vascular dementia.  He has shown some improvement on starting Namenda and vitamin B12 replacement.  History of remote right pontine infarct in 2013 and suspect left frontal and right parietal infarcts following cardiac surgery in December 2021.  Vascular risk factors of coronary artery disease, left ventricular thrombus, perioperative atrial fibrillation, diabetes, hypertension, hyperlipidemia and age     -Continue Namenda 10 mg twice daily.  -Start sertraline 25 mg nightly for suspected underlying depression/anxiety.  Unable to adequately complete depression scale.  Has follow-up with PCP next month who can further manage at that time -Continue vitamin B12  replacement as per primary physician  -Discussed importance of routine cognitively challenging activities like solving crossword puzzles, playing bridge and sodoku as well as routine physical activity, managing vascular risk factors, adequate sleep and healthy diet.  We also discussed memory compensation strategies.      Follow-up in 6 months or call earlier if needed    CC:  Burnard Bunting, MD   I spent 37 minutes of face-to-face and non-face-to-face time with patient and family.  This included previsit  chart review, lab review, study review, order entry, electronic health record documentation, patient and family education and discussion regarding vascular dementia with review and discussion of MMSE, treatment plan, and answered all her questions to patient and family satisfaction  Frann Rider, [facility]  [facility] [address] [place], [place] [address]  Phone [phone] Fax [phone] Note: This document was prepared with digital dictation and possible smart phrase technology. Any transcriptional errors that result from this process are unintentional.

## 2022-03-27 NOTE — Patient Instructions (Addendum)
Your Plan:  Continue Namenda '10mg'$  twice daily for memory  Recommend starting sertraline (Zoloft) '25mg'$  nightly for depression/anxiety - can further discuss dosage adjustment or change of medication with primary doctor at follow up visit next month  Very important to increase daily activity and memory exercises as this can help improve memory    Follow up in 6 months or call earlier if needed     Thank you for coming to see [place] at [facility]. I hope we have been able to provide you high quality care today.  You may receive a patient satisfaction survey over the next few weeks. We would appreciate your feedback and comments so that we may continue to improve ourselves and the health of our patients.

## 2022-04-03 DIAGNOSIS — D1801 Hemangioma of skin and subcutaneous tissue: Secondary | ICD-10-CM | POA: Diagnosis not present

## 2022-04-03 DIAGNOSIS — L820 Inflamed seborrheic keratosis: Secondary | ICD-10-CM | POA: Diagnosis not present

## 2022-04-03 DIAGNOSIS — D224 Melanocytic nevi of scalp and neck: Secondary | ICD-10-CM | POA: Diagnosis not present

## 2022-04-03 DIAGNOSIS — L565 Disseminated superficial actinic porokeratosis (DSAP): Secondary | ICD-10-CM | POA: Diagnosis not present

## 2022-04-03 DIAGNOSIS — L570 Actinic keratosis: Secondary | ICD-10-CM | POA: Diagnosis not present

## 2022-04-03 DIAGNOSIS — L821 Other seborrheic keratosis: Secondary | ICD-10-CM | POA: Diagnosis not present

## 2022-04-04 ENCOUNTER — Ambulatory Visit: Payer: Medicare HMO | Admitting: Podiatry

## 2022-04-04 ENCOUNTER — Encounter: Payer: Self-pay | Admitting: Podiatry

## 2022-04-04 DIAGNOSIS — B351 Tinea unguium: Secondary | ICD-10-CM | POA: Diagnosis not present

## 2022-04-04 DIAGNOSIS — D689 Coagulation defect, unspecified: Secondary | ICD-10-CM | POA: Diagnosis not present

## 2022-04-04 DIAGNOSIS — M79676 Pain in unspecified toe(s): Secondary | ICD-10-CM

## 2022-04-04 DIAGNOSIS — E119 Type 2 diabetes mellitus without complications: Secondary | ICD-10-CM | POA: Diagnosis not present

## 2022-04-04 NOTE — Progress Notes (Signed)
This patient returns to my office for at risk foot care.  This patient requires this care by a professional since this patient will be at risk due to having diabetes and CKD.and coagulation defect..  Patient is taking plavix and eliquis causing coagulation defect. This patient is unable to cut nails himself since the patient cannot reach his nails.These nails are painful walking and wearing shoes.  This patient presents for at risk foot care today.  General Appearance  Alert, conversant and in no acute stress.  Vascular  Dorsalis pedis and posterior tibial  pulses are palpable  bilaterally.  Capillary return is within normal limits  bilaterally. Temperature is within normal limits  bilaterally.  Neurologic  Senn-Weinstein monofilament wire test within normal limits  bilaterally. Muscle power within normal limits bilaterally.  Nails Thick disfigured discolored nails with subungual debris  from hallux to fifth toes bilaterally. No evidence of bacterial infection or drainage bilaterally.  Asymptomatic left hallux nail.  Orthopedic  No limitations of motion  feet .  No crepitus or effusions noted.  HAV  B/L.  Overlapping second toe left foot.  Skin  normotropic skin with no porokeratosis noted bilaterally.  No signs of infections or ulcers noted.   Asymptomatic pinch callus.  Onychomycosis  Pain in right toes  Pain in left toes  Consent was obtained for treatment procedures.   Mechanical debridement of nails 1-5  bilaterally performed with a nail nipper.  Filed with dremel without incident.    Return office visit 4  months                  Told patient to return for periodic foot care and evaluation due to potential at risk complications.   Gardiner Barefoot DPM

## 2022-04-07 DIAGNOSIS — E1129 Type 2 diabetes mellitus with other diabetic kidney complication: Secondary | ICD-10-CM | POA: Diagnosis not present

## 2022-04-26 DIAGNOSIS — Z23 Encounter for immunization: Secondary | ICD-10-CM | POA: Diagnosis not present

## 2022-04-30 DIAGNOSIS — I10 Essential (primary) hypertension: Secondary | ICD-10-CM | POA: Diagnosis not present

## 2022-04-30 DIAGNOSIS — R7989 Other specified abnormal findings of blood chemistry: Secondary | ICD-10-CM | POA: Diagnosis not present

## 2022-04-30 DIAGNOSIS — E785 Hyperlipidemia, unspecified: Secondary | ICD-10-CM | POA: Diagnosis not present

## 2022-04-30 DIAGNOSIS — E1129 Type 2 diabetes mellitus with other diabetic kidney complication: Secondary | ICD-10-CM | POA: Diagnosis not present

## 2022-04-30 DIAGNOSIS — D649 Anemia, unspecified: Secondary | ICD-10-CM | POA: Diagnosis not present

## 2022-05-02 DIAGNOSIS — R3915 Urgency of urination: Secondary | ICD-10-CM | POA: Diagnosis not present

## 2022-05-02 DIAGNOSIS — N401 Enlarged prostate with lower urinary tract symptoms: Secondary | ICD-10-CM | POA: Diagnosis not present

## 2022-05-02 DIAGNOSIS — R3912 Poor urinary stream: Secondary | ICD-10-CM | POA: Diagnosis not present

## 2022-05-07 DIAGNOSIS — Z794 Long term (current) use of insulin: Secondary | ICD-10-CM | POA: Diagnosis not present

## 2022-05-07 DIAGNOSIS — R82998 Other abnormal findings in urine: Secondary | ICD-10-CM | POA: Diagnosis not present

## 2022-05-07 DIAGNOSIS — N1831 Chronic kidney disease, stage 3a: Secondary | ICD-10-CM | POA: Diagnosis not present

## 2022-05-07 DIAGNOSIS — Z1331 Encounter for screening for depression: Secondary | ICD-10-CM | POA: Diagnosis not present

## 2022-05-07 DIAGNOSIS — E1129 Type 2 diabetes mellitus with other diabetic kidney complication: Secondary | ICD-10-CM | POA: Diagnosis not present

## 2022-05-07 DIAGNOSIS — Z1212 Encounter for screening for malignant neoplasm of rectum: Secondary | ICD-10-CM | POA: Diagnosis not present

## 2022-05-07 DIAGNOSIS — E1139 Type 2 diabetes mellitus with other diabetic ophthalmic complication: Secondary | ICD-10-CM | POA: Diagnosis not present

## 2022-05-07 DIAGNOSIS — E785 Hyperlipidemia, unspecified: Secondary | ICD-10-CM | POA: Diagnosis not present

## 2022-05-07 DIAGNOSIS — E039 Hypothyroidism, unspecified: Secondary | ICD-10-CM | POA: Diagnosis not present

## 2022-05-07 DIAGNOSIS — Z0000 Encounter for general adult medical examination without abnormal findings: Secondary | ICD-10-CM | POA: Diagnosis not present

## 2022-05-07 DIAGNOSIS — Z1339 Encounter for screening examination for other mental health and behavioral disorders: Secondary | ICD-10-CM | POA: Diagnosis not present

## 2022-05-07 DIAGNOSIS — D508 Other iron deficiency anemias: Secondary | ICD-10-CM | POA: Diagnosis not present

## 2022-05-07 DIAGNOSIS — I129 Hypertensive chronic kidney disease with stage 1 through stage 4 chronic kidney disease, or unspecified chronic kidney disease: Secondary | ICD-10-CM | POA: Diagnosis not present

## 2022-05-16 ENCOUNTER — Telehealth: Payer: Self-pay | Admitting: Internal Medicine

## 2022-05-16 DIAGNOSIS — H2513 Age-related nuclear cataract, bilateral: Secondary | ICD-10-CM | POA: Diagnosis not present

## 2022-05-16 DIAGNOSIS — E119 Type 2 diabetes mellitus without complications: Secondary | ICD-10-CM | POA: Diagnosis not present

## 2022-05-16 DIAGNOSIS — H52203 Unspecified astigmatism, bilateral: Secondary | ICD-10-CM | POA: Diagnosis not present

## 2022-05-16 DIAGNOSIS — G4733 Obstructive sleep apnea (adult) (pediatric): Secondary | ICD-10-CM

## 2022-05-16 NOTE — Telephone Encounter (Signed)
Pt's spouse called stating that DME had no records from [place] about when pt's last appt was. Stated to her that with pt having [employer], pt is needing to switch to [facility] for his CPAP. Stated to her that we would place a new Rx for pt's CPAP and get it sent to [facility] and we would also send the last OV notes to them too and she verbalized understanding. Nothing further needed.

## 2022-06-16 DIAGNOSIS — K529 Noninfective gastroenteritis and colitis, unspecified: Secondary | ICD-10-CM | POA: Diagnosis not present

## 2022-06-17 DIAGNOSIS — R197 Diarrhea, unspecified: Secondary | ICD-10-CM | POA: Diagnosis not present

## 2022-06-17 DIAGNOSIS — K529 Noninfective gastroenteritis and colitis, unspecified: Secondary | ICD-10-CM | POA: Diagnosis not present

## 2022-06-17 NOTE — Progress Notes (Unsigned)
Cardiology Office Note  Date: 06/18/2022   ID: [name], DOB August 12, 1945, MRN [id number]  PCP:  Burnard Bunting, MD  Cardiologist:  Rozann Lesches, MD Electrophysiologist:  None   Chief Complaint  Patient presents with   Cardiac follow-up    History of Present Illness: Patrick Miranda is a 76 y.o. male last seen in May.  He is here today with his wife for a follow-up visit.  Reports no angina, stable NYHA class II dyspnea, no palpitations or syncope.  I reviewed his medications which are stable from a cardiac perspective.  Requesting lab work from PCP done back in September at physical.  I personally reviewed his ECG today which shows sinus rhythm with IVCD, old anterior infarct pattern and nonspecific ST changes.  His last echocardiogram was in September 2022 at which point LVEF was approximately 45%.  We discussed getting an updated study.  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[place])    Left ventricular mural thrombus    NSTEMI (non-ST elevated myocardial infarction) ([place])    OSA on CPAP    Pulmonary emboli [facility])    May 2021   Skin cancer    Type 2 diabetes mellitus ([place])     Past Surgical History:  Procedure Laterality Date   COLON SURGERY     COLONOSCOPY W/ POLYPECTOMY     CORONARY ARTERY BYPASS GRAFT N/A 07/09/2020   Procedure: CORONARY ARTERY BYPASS GRAFTING (CABG) TIMES THREE , USING LEFT INTERNAL MAMMARY ARTERY AND RIGHT LEG GREATER SAPHENOUS VEIN HARVESTED ENDOSCOPICALLY;  Surgeon: Ivin Poot, MD;  Location: [place];  Service: Open Heart Surgery;  Laterality: N/A;    LEFT HEART CATH AND CORONARY ANGIOGRAPHY N/A 07/04/2020   Procedure: LEFT HEART CATH AND CORONARY ANGIOGRAPHY;  Surgeon: Troy Sine, MD;  Location: [place] CV [facility];  Service: Cardiovascular;  Laterality: N/A;   TEE WITHOUT CARDIOVERSION N/A 07/09/2020   Procedure: TRANSESOPHAGEAL ECHOCARDIOGRAM (TEE);  Surgeon: Prescott Gum, Collier Salina, MD;  Location: [place];  Service: Open Heart Surgery;  Laterality: N/A;   TOTAL KNEE ARTHROPLASTY Left 04/22/2013   Procedure: TOTAL KNEE ARTHROPLASTY- left;  Surgeon: Augustin Schooling, MD;  Location: [place];  Service: Orthopedics;  Laterality: Left;  with femoral block   TOTAL KNEE ARTHROPLASTY Right 01/06/2014   Procedure: RIGHT TOTAL KNEE ARTHROPLASTY;  Surgeon: Augustin Schooling, MD;  Location: [place];  Service: Orthopedics;  Laterality: Right;   TRANSURETHRAL RESECTION OF PROSTATE N/A 05/14/2021   Procedure: TRANSURETHRAL RESECTION OF THE PROSTATE (TURP);  Surgeon: Robley Fries, MD;  Location: [facility];  Service: Urology;  Laterality: N/A;  90 MINS    Current Outpatient Medications  Medication Sig Dispense Refill   acetaminophen (TYLENOL) 325 MG tablet Take 2 tablets (650 mg total) by mouth every 6 (six) hours as needed for mild pain or headache (or Fever >/= 101).     apixaban (ELIQUIS) 5 MG TABS tablet TAKE 1 TABLET(5 MG) BY MOUTH TWICE DAILY 180 tablet 1  BD PEN NEEDLE NANO U/F 32G X 4 MM MISC 1 each by Other route daily.      Biotin 5000 MCG TABS Take 5,000 mcg by mouth daily.     Continuous Blood Gluc Sensor (FREESTYLE LIBRE 2 SENSOR) MISC See admin instructions.     Cyanocobalamin (B-12) 5000 MCG SUBL Place 5,000 mcg under the tongue daily.     fenofibrate 160 MG tablet 1 capsule with a meal Orally Once a day     Insulin Aspart (NOVOLOG FLEXPEN [place]) Inject 0-15 Units into the skin 3 (three) times daily before meals. Dose per sliding scale     losartan (COZAAR) 25 MG tablet TAKE 1/2 TABLET(12.5 MG) BY MOUTH DAILY 45 tablet 1   memantine (NAMENDA) 10 MG  tablet TAKE 1 TABLET(10 MG) BY MOUTH TWICE DAILY 180 tablet 1   metFORMIN (GLUCOPHAGE) 500 MG tablet Take 500 mg by mouth 2 (two) times daily with a meal.     metoprolol succinate (TOPROL-XL) 25 MG 24 hr tablet Take 0.5 tablets (12.5 mg total) by mouth daily. 45 tablet 3   mirabegron ER (MYRBETRIQ) 25 MG TB24 tablet Take 25 mg by mouth daily.     Multiple Vitamin (MULTIVITAMIN WITH MINERALS) TABS tablet Take 1 tablet by mouth daily.     ONETOUCH VERIO test strip 1 each by Other route in the morning, at noon, and at bedtime.   3   rosuvastatin (CRESTOR) 20 MG tablet TAKE 1 TABLET(20 MG) BY MOUTH DAILY 90 tablet 3   sertraline (ZOLOFT) 25 MG tablet Take 1 tablet (25 mg total) by mouth daily. 30 tablet 5   tamsulosin (FLOMAX) 0.4 MG CAPS capsule Take 0.4 mg by mouth daily.     TOUJEO SOLOSTAR 300 UNIT/ML Solostar Pen Inject 15 Units into the skin daily.     No current facility-administered medications for this visit.   Allergies:  Lipitor [atorvastatin] and Sitagliptin   ROS: No orthopnea or PND.  No leg swelling.  Physical Exam: VS:  BP 136/72   Pulse 64   Ht 5' 10.5" (1.791 m)   Wt 175 lb (79.4 kg)   SpO2 98%   BMI 24.75 kg/m , BMI Body mass index is 24.75 kg/m.  Wt Readings from Last 3 Encounters:  06/18/22 175 lb (79.4 kg)  03/27/22 177 lb 6 oz (80.5 kg)  12/30/21 179 lb (81.2 kg)    General: Patient appears comfortable at rest. HEENT: Conjunctiva and lids normal. Neck: Supple, no elevated JVP or carotid bruits. Lungs: Clear to auscultation, nonlabored breathing at rest. Cardiac: Regular rate and rhythm, no S3 or significant systolic murmur . Abdomen: Soft, nontender, bowel sounds present. Extremities: No pitting edema.  ECG:  An ECG dated 05/18/2021 was personally reviewed today and demonstrated:  Sinus bradycardia with nonspecific T wave changes.  Recent Labwork:    Component Value Date/Time   CHOL 65 09/09/2020 0402   TRIG 94 09/09/2020 0402   HDL 21 (L) 09/09/2020  0402   CHOLHDL 3.1 09/09/2020 0402   VLDL 19 09/09/2020 0402   LDLCALC 25 09/09/2020 0402   Other Studies Reviewed Today:  Echocardiogram 03/27/2021:  1. Akinesis of the inferior/inferoseptal base with aneurysmal dilitation.  Apical hypokinesis. . Left ventricular ejection fraction, by estimation,  is 45%. The left ventricle has mildly decreased function. The left  ventricle demonstrates regional wall  motion abnormalities (see scoring diagram/findings for description). Left  ventricular diastolic parameters are consistent with Grade I diastolic  dysfunction (impaired relaxation).   2.  Right ventricular systolic function is normal. The right ventricular  size is normal. There is normal pulmonary artery systolic pressure.   3. The mitral valve is normal in structure. Trivial mitral valve  regurgitation. No evidence of mitral stenosis.   4. The aortic valve has an indeterminant number of cusps. Aortic valve  regurgitation is mild. No aortic stenosis is present.   Assessment and Plan:  1.  HFmrEF with ischemic cardiomyopathy, LVEF approximately 45% by echocardiogram last year.  He reports NYHA class II dyspnea, no fluid retention and stable weight.  Plan to update echocardiogram.  For now continue Toprol-XL and losartan.  Requesting interval lab work from PCP.  2.  Paroxysmal atrial fibrillation with CHA2DS2-VASc score of 7.  He remains on Eliquis for stroke prophylaxis.  Currently in sinus rhythm without significant palpitations reported.  3.  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  No active angina at this time.  Continue Crestor.  Medication Adjustments/Labs and Tests Ordered: Current medicines are reviewed at length with the patient today.  Concerns regarding medicines are outlined above.   Tests Ordered: Orders Placed This Encounter  Procedures   ECHOCARDIOGRAM COMPLETE    Medication Changes: No orders of the defined types were placed in this  encounter.   Disposition:  Follow up  6 months.  Signed, Satira Sark, MD, [facility] 06/18/2022 8:48 AM    [facility] [place]. [address], [place],  [address] Phone: [phone]; Fax: [phone]

## 2022-06-18 ENCOUNTER — Ambulatory Visit: Payer: Medicare HMO | Attending: Cardiology | Admitting: Cardiology

## 2022-06-18 ENCOUNTER — Encounter: Payer: Self-pay | Admitting: Cardiology

## 2022-06-18 VITALS — BP 136/72 | HR 64 | Ht 70.5 in | Wt 175.0 lb

## 2022-06-18 DIAGNOSIS — I25119 Atherosclerotic heart disease of native coronary artery with unspecified angina pectoris: Secondary | ICD-10-CM

## 2022-06-18 DIAGNOSIS — I255 Ischemic cardiomyopathy: Secondary | ICD-10-CM | POA: Diagnosis not present

## 2022-06-18 DIAGNOSIS — I5022 Chronic systolic (congestive) heart failure: Secondary | ICD-10-CM | POA: Diagnosis not present

## 2022-06-18 NOTE — Patient Instructions (Signed)
Medication Instructions:  Your physician recommends that you continue on your current medications as directed. Please refer to the Current Medication list given to you today.   Labwork: None today   Testing/Procedures: Your physician has requested that you have an echocardiogram. Echocardiography is a painless test that uses sound waves to create images of your heart. It provides your doctor with information about the size and shape of your heart and how well your heart's chambers and valves are working. This procedure takes approximately one hour. There are no restrictions for this procedure. Please do NOT wear cologne, perfume, aftershave, or lotions (deodorant is allowed). Please arrive 15 minutes prior to your appointment time.   Follow-Up: 6 months  Any Other Special Instructions Will Be Listed Below (If Applicable).  If you need a refill on your cardiac medications before your next appointment, please call your pharmacy.  

## 2022-06-18 NOTE — Addendum Note (Signed)
Addended by: Barbarann Ehlers A on: 06/18/2022 09:34 AM   Modules accepted: Orders

## 2022-06-23 ENCOUNTER — Ambulatory Visit (HOSPITAL_COMMUNITY)
Admission: RE | Admit: 2022-06-23 | Discharge: 2022-06-23 | Disposition: A | Discharge status: Home / Self Care | Payer: Medicare HMO | Source: Ambulatory Visit | Attending: Cardiology | Admitting: Cardiology

## 2022-06-23 DIAGNOSIS — I255 Ischemic cardiomyopathy: Secondary | ICD-10-CM | POA: Insufficient documentation

## 2022-06-23 LAB — ECHOCARDIOGRAM COMPLETE
AR max vel: 1.7 cm2
AV Area VTI: 1.78 cm2
AV Area mean vel: 1.79 cm2
AV Mean grad: 8.1 mmHg
AV Peak grad: 15.6 mmHg
Ao pk vel: 1.97 m/s
Area-P 1/2: 3.65 cm2
MV M vel: 4.97 m/s
MV Peak grad: 98.8 mmHg
P 1/2 time: 562 msec
S' Lateral: 4.2 cm

## 2022-06-23 NOTE — Progress Notes (Signed)
*  PRELIMINARY RESULTS* Echocardiogram 2D Echocardiogram has been performed.  Patrick Miranda 06/23/2022, 3:53 PM

## 2022-07-04 ENCOUNTER — Telehealth: Payer: Self-pay

## 2022-07-04 MED ORDER — EMPAGLIFLOZIN 10 MG PO TABS: 10.0000 mg | ORAL_TABLET | Freq: Every day | ORAL | 3 refills | Status: DC

## 2022-07-04 NOTE — Telephone Encounter (Addendum)
     Satira Sark, MD  Bernita Raisin, RN Results reviewed.  Follow-up echocardiogram shows LVEF 45 to 50% with wall motion abnormalities consistent with known ischemic cardiomyopathy.  Only mild mitral regurgitation.  He is on Toprol XL and Cozaar.  Would be reasonable to add either [name] or Farxiga 10 mg daily to round out cardiomyopathy regimen if he is in agreement.           ----- Message from Rollen Sox, [facility] sent at 06/24/2022  9:02 AM EST ----- Regarding: RE: cost to pt for jardiance and farxiga [name] is cheaper right now on [employer]  ----- Message ----- From: Bernita Raisin, RN Sent: 06/24/2022   8:31 AM EST To: [facility] Pharm[facility] Subject: cost to pt for jardiance and farxiga           MD wants either one for patient, which is cheaper?   Thanks,  Cathey       Echo results discussed with wife, [name] 10 mg daily e-scribed to [employer]. Copied pcp

## 2022-07-17 DIAGNOSIS — K529 Noninfective gastroenteritis and colitis, unspecified: Secondary | ICD-10-CM | POA: Diagnosis not present

## 2022-08-02 ENCOUNTER — Other Ambulatory Visit: Payer: Self-pay | Admitting: Neurology

## 2022-08-06 ENCOUNTER — Ambulatory Visit (INDEPENDENT_AMBULATORY_CARE_PROVIDER_SITE_OTHER): Payer: Medicare HMO | Admitting: Podiatry

## 2022-08-06 ENCOUNTER — Encounter: Payer: Self-pay | Admitting: Podiatry

## 2022-08-06 DIAGNOSIS — B351 Tinea unguium: Secondary | ICD-10-CM | POA: Diagnosis not present

## 2022-08-06 DIAGNOSIS — D689 Coagulation defect, unspecified: Secondary | ICD-10-CM

## 2022-08-06 DIAGNOSIS — M79676 Pain in unspecified toe(s): Secondary | ICD-10-CM | POA: Diagnosis not present

## 2022-08-06 DIAGNOSIS — E119 Type 2 diabetes mellitus without complications: Secondary | ICD-10-CM

## 2022-08-06 NOTE — Progress Notes (Signed)
This patient returns to my office for at risk foot care.  This patient requires this care by a professional since this patient will be at risk due to having diabetes and CKD.and coagulation defect..  Patient is taking plavix and eliquis causing coagulation defect. This patient is unable to cut nails himself since the patient cannot reach his nails.These nails are painful walking and wearing shoes.  This patient presents for at risk foot care today.  General Appearance  Alert, conversant and in no acute stress.  Vascular  Dorsalis pedis and posterior tibial  pulses are palpable  bilaterally.  Capillary return is within normal limits  bilaterally. Temperature is within normal limits  bilaterally.  Neurologic  Senn-Weinstein monofilament wire test within normal limits  bilaterally. Muscle power within normal limits bilaterally.  Nails Thick disfigured discolored nails with subungual debris  hallux nails  bilaterally. No evidence of bacterial infection or drainage bilaterally.  Asymptomatic left hallux nail.  Orthopedic  No limitations of motion  feet .  No crepitus or effusions noted.  HAV  B/L.  Overlapping second toe left foot.  Skin  normotropic skin with no porokeratosis noted bilaterally.  No signs of infections or ulcers noted.   Asymptomatic pinch callus.  Onychomycosis  Pain in right toes  Pain in left toes  Consent was obtained for treatment procedures.   Mechanical debridement of nails 1-5  bilaterally performed with a nail nipper.  Filed with dremel without incident.    Return office visit 4  months                  Told patient to return for periodic foot care and evaluation due to potential at risk complications.   Gardiner Barefoot DPM

## 2022-08-07 DIAGNOSIS — E1129 Type 2 diabetes mellitus with other diabetic kidney complication: Secondary | ICD-10-CM | POA: Diagnosis not present

## 2022-08-07 DIAGNOSIS — I2510 Atherosclerotic heart disease of native coronary artery without angina pectoris: Secondary | ICD-10-CM | POA: Diagnosis not present

## 2022-08-07 DIAGNOSIS — E039 Hypothyroidism, unspecified: Secondary | ICD-10-CM | POA: Diagnosis not present

## 2022-08-07 DIAGNOSIS — N1831 Chronic kidney disease, stage 3a: Secondary | ICD-10-CM | POA: Diagnosis not present

## 2022-08-07 DIAGNOSIS — I129 Hypertensive chronic kidney disease with stage 1 through stage 4 chronic kidney disease, or unspecified chronic kidney disease: Secondary | ICD-10-CM | POA: Diagnosis not present

## 2022-08-07 DIAGNOSIS — Z794 Long term (current) use of insulin: Secondary | ICD-10-CM | POA: Diagnosis not present

## 2022-08-20 DIAGNOSIS — E1129 Type 2 diabetes mellitus with other diabetic kidney complication: Secondary | ICD-10-CM | POA: Diagnosis not present

## 2022-09-04 ENCOUNTER — Telehealth: Payer: Self-pay | Admitting: Cardiology

## 2022-09-04 ENCOUNTER — Other Ambulatory Visit: Payer: Self-pay

## 2022-09-04 MED ORDER — ROSUVASTATIN CALCIUM 20 MG PO TABS: ORAL_TABLET | ORAL | 3 refills | Status: DC

## 2022-09-04 NOTE — Telephone Encounter (Signed)
Per wife,pt not taking daily, asked for refill to pharmacy,done crestor 20 mg qd

## 2022-09-04 NOTE — Telephone Encounter (Signed)
Pt c/o medication issue:  1. Name of Medication: rosuvastatin (CRESTOR) 20 MG tablet   2. How are you currently taking this medication (dosage and times per day)? As prescribed   3. Are you having a reaction (difficulty breathing--STAT)?   4. What is your medication issue? Patients wife state they have two different instructions for this medication, since one is from another doctor, but would like to know which prescription instructions they should follow and get clarification as to who should be filling this med. Please advise.

## 2022-09-04 NOTE — Telephone Encounter (Signed)
Refilled crestor 20 mg qd, #90,RF:3

## 2022-09-11 ENCOUNTER — Other Ambulatory Visit: Payer: Self-pay | Admitting: Neurology

## 2022-09-11 MED ORDER — SERTRALINE HCL 25 MG PO TABS: 25.0000 mg | ORAL_TABLET | Freq: Every day | ORAL | 5 refills | Status: DC

## 2022-09-24 NOTE — Progress Notes (Unsigned)
[facility] [address] [place]. [place] [address] [phone]       OFFICE FOLLOW UP VISIT NOTE  [name] Date of Birth:  1946-07-03 Medical Record Number:  [id number]   Primary neurologist: Dr. Leonie Man Referring MD: Dr. Erlinda Hong Reason for Referral: Dementia   No chief complaint on file.     HPI:   Initial visit 12/06/2020 Dr. Lerry Paterson. Klinke is a 77 Caucasian male seen today for initial office consultation visit following hospital admission for confusion.  History is obtained from the patient and wife and review of electronic medical records and I personally reviewed available pertinent imaging films in PACS.  He has past medical history of right pontine stroke in 2013, coronary artery disease s/p CABG December 21, hypertension, hyperlipidemia, cardiomyopathy, left ventricular thrombus and pulmonary emboli.  Patient was admitted to [facility] in February 2022 for altered mental status for couple of weeks.  He had some odd behavior and was confused on multiple occasions.  CT scan of the head was showed suspected subacute left frontal and old right parietal infarcts.  MRI scan showed some T2 shine through but no acute infarct and dislocations.  2D echo showed ejection fraction of 40% with a kinesis of the inferior and inferior septal base with aneurysmal dilatation.  This was improved compared with previous echo from January 2022 when there was apical thrombus which was no longer seen.  EEG showed mild diffuse slowing suggestive of encephalopathy but no seizures.  LDL cholesterol 25 mg percent.  Hemoglobin A1c was 6.8.  Patient had been on Coumadin which she had a tough time taking and was switched to Eliquis which she is not tolerating much better without bruising or bleeding.  Continues to have memory loss and cognitive impairment which is unchanged.  Patient not been evaluated for reversible causes of cognitive impairment and not been tried on  medication like Aricept or Namenda yet.  Patient had a remote history of a right pontine stroke in 2013 and had only mild speech difficulties which resolved completely.  He had no other strokes due to his recent cardiac surgery in December.  He had transient postop A. fib as well and a left ventricular thrombus on the echo which has since resolved.  There is no family history of Alzheimer's.  Patient has no history of seizures or significant head injury with loss of consciousness.  Patient's cognitive impairment is mostly related to short-term memory and remembering recent information.  He still mostly independent in activities of daily living and can attend to his own bodily needs.  He does get frustrated easily but there is no agitation, violent behavior, delusions or hallucinations.  He has not exhibited any unsafe behavior and does not wander off.  His balance is poor and he does use a cane and he fell once in the bathroom a month ago does not fall on a regular basis.  Update 03/13/2021 Dr. Leonie Man; He returns for follow-up after last visit 3 months ago.  He is accompanied by his wife.   They feel his memory is slightly worse.  He does get confused off and on at times.  He is still mostly independent with activities of daily living but he needs constant reminders and repeated reminders about recent staffing which he forgets.  He has not been driving.  Patient has been complaining of getting tired easily and hence does not walk a lot.  Wife feels at times when he is tired he  can drag his feet.  He has had no falls or injuries.  There have been no delusions or hallucinations noted.  He does get occasionally agitated but there has been no violent behavior.  He continues to live with his wife and daughter and have somebody around him all the time.  He did undergo lab work on 12/06/2020 which was significant for low vitamin B12 levels of 153 otherwise TSH, homocystine and RPR were normal.  EEG on 12/31/2020 was normal.   Patient had a fall and was seen in the ER on 02/17/2021 and had a CT scan of the head which showed mild generalized atrophy and bilateral lower cortical infarcts with no acute abnormality.  On Mini-Mental status exam today he actually scored 27 out of 30 which is an improvement from last visit when he had scored 35/30. Update 09/23/2021 Dr. Leonie Man: He returns for follow-up after last visit 6 months ago.  He is accompanied by his wife and daughter.  They feel he is doing about the same.  His memory and cognitive difficulties are unchanged.  He remains on Namenda 10 mg twice daily which is tolerating well without side effects.  Patient seems to sleep a lot.  Does not walk a lot for exercise.  He has not been regularly doing cognitively challenging activities.  He has not had any new health issues.  Remains on Eliquis which is tolerating well without bleeding or bruising.  Blood pressures well controlled today it is 119/74.  Sugars are also under good control.  He is tolerating Crestor well without muscle aches and pains.  He has no new complaints today.  On Mini-Mental status exam today scored 25/30 which is similar to 27/30 at last visit 6 months ago.  Family does not feel he has had any significant cognitive decline  Update 03/27/2022 JM: Patient returns for 73-monthmemory follow-up accompanied by his wife and daughter.   Patient believes memory has been stable but family believes memory has gradually declined since prior visit. Will misplace items, will forget where their home is, short term memory worsening, difficulty operating remote or cell phone at times. Sleeps throughout the night, can have occasional nocturia, frequent daytime napping, will get up to eat or go to the bathroom. He doesn't do any type of physical or mental activity, wife tries to encourage him but he refuses. Does use his CPAP nightly. Appetite good.  Wife questions possible underlying depression that could be contributing.  Is scheduled next  month for yearly physical with PCP.  Blood pressure not routinely monitored at home.  Glucose levels have been slightly elevated, past 7-day average 210, wife believes this is because he will forget that he just ate and then eat again.  No further concerns at this time   Update 09/25/2022 JM: Returns for 68-monthollow-up.       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 s/p CABG in 06/2020 with LIMA-LAD, SVG-D1 and SVG-RCA   [facility]07 Dec 2019   [facility]   Depression    Essential hypertension    Hernia, umbilical    HOH (hard of hearing)    Hyperlipidemia    Ischemic cardiomyopathy  Ischemic necrosis of small bowel (HCC)    Left ventricular mural thrombus    NSTEMI (non-ST elevated myocardial infarction) (HCC)    OSA on CPAP    Pulmonary emboli [facility])    May 2021   Skin cancer    Type 2 diabetes mellitus ([place])     Social History:  Social History   Socioeconomic History   Marital status: Married    Spouse name: [name]   Number of children: Not on file   Years of education: Not on file   Highest education level: Not on file  Occupational History   Not on file  Tobacco Use   Smoking status: Never   Smokeless tobacco: Never  Vaping Use   Vaping Use: Never used  Substance and Sexual Activity   Alcohol use: No   Drug use: No   Sexual activity: Not on file  Other Topics Concern   Not on file  Social History Narrative   Lives with wife at Daughter's home   Right Handed   Drinks very little caffeine daily   Social Determinants of Health   Financial Resource Strain: Not on file  Food Insecurity: No Food Insecurity (06/21/2021)   Hunger Vital Sign    Worried About Running Out of Food in the Last Year: Never true    Ran Out of Food in  the Last Year: Never true  Transportation Needs: No Transportation Needs (10/24/2021)   PRAPARE - Hydrologist (Medical): No    Lack of Transportation (Non-Medical): No  Physical Activity: Not on file  Stress: Not on file  Social Connections: Not on file  Intimate Partner Violence: Not on file    Medications:   Current Outpatient Medications on File Prior to Visit  Medication Sig Dispense Refill   acetaminophen (TYLENOL) 325 MG tablet Take 2 tablets (650 mg total) by mouth every 6 (six) hours as needed for mild pain or headache (or Fever >/= 101).     apixaban (ELIQUIS) 5 MG TABS tablet TAKE 1 TABLET(5 MG) BY MOUTH TWICE DAILY 180 tablet 1   BD PEN NEEDLE NANO U/F 32G X 4 MM MISC 1 each by Other route daily.      Biotin 5000 MCG TABS Take 5,000 mcg by mouth daily.     Continuous Blood Gluc Sensor (FREESTYLE LIBRE 2 SENSOR) MISC See admin instructions.     Cyanocobalamin (B-12) 5000 MCG SUBL Place 5,000 mcg under the tongue daily.     empagliflozin ([name]) 10 MG TABS tablet Take 1 tablet (10 mg total) by mouth daily before breakfast. 90 tablet 3   fenofibrate 160 MG tablet 1 capsule with a meal Orally Once a day     Insulin Aspart (NOVOLOG FLEXPEN [place]) Inject 0-15 Units into the skin 3 (three) times daily before meals. Dose per sliding scale     losartan (COZAAR) 25 MG tablet TAKE 1/2 TABLET(12.5 MG) BY MOUTH DAILY 45 tablet 1   memantine (NAMENDA) 10 MG tablet TAKE 1 TABLET(10 MG) BY MOUTH TWICE DAILY 180 tablet 0   metFORMIN (GLUCOPHAGE) 500 MG tablet Take 500 mg by mouth 2 (two) times daily with a meal.     metoprolol succinate (TOPROL-XL) 25 MG 24 hr tablet Take 0.5 tablets (12.5 mg total) by mouth daily. 45 tablet 3   mirabegron ER (MYRBETRIQ) 25 MG TB24 tablet Take 25 mg by mouth daily.     Multiple Vitamin (MULTIVITAMIN WITH MINERALS) TABS tablet Take 1 tablet by mouth  daily.     ONETOUCH VERIO test strip 1 each by Other route in the morning, at noon,  and at bedtime.   3   rosuvastatin (CRESTOR) 20 MG tablet TAKE 1 TABLET(20 MG) BY MOUTH DAILY 90 tablet 3   sertraline (ZOLOFT) 25 MG tablet Take 1 tablet (25 mg total) by mouth daily. 30 tablet 5   tamsulosin (FLOMAX) 0.4 MG CAPS capsule Take 0.4 mg by mouth daily.     TOUJEO SOLOSTAR 300 UNIT/ML Solostar Pen Inject 15 Units into the skin daily.     No current facility-administered medications on file prior to visit.    Allergies:   Allergies  Allergen Reactions   Lipitor [Atorvastatin] Other (See Comments)    Muscle pain   Sitagliptin     Myalgias Other reaction(s): myalgias    Physical Exam There were no vitals filed for this visit.  There is no height or weight on file to calculate BMI.   General: well developed, well nourished elderly Caucasian male, seated, in no evident distress Head: head normocephalic and atraumatic.   Neck: supple with no carotid or supraclavicular bruits Cardiovascular: regular rate and rhythm, no murmurs Musculoskeletal: no deformity Skin:  no rash/petichiae Vascular:  Normal pulses all extremities  Neurologic Exam Mental Status: Awake and fully alert. Oriented to place and time. Recent and remote memory diminished. Attention span, concentration and fund of knowledge poor mood and affect appropriate.    03/27/2022    3:09 PM 09/23/2021    2:31 PM 03/13/2021    3:08 PM  MMSE - Mini Mental State Exam  Orientation to time '4 4 3  '$ Orientation to Place '5 5 4  '$ Registration '2 3 3  '$ Attention/ Calculation '1 2 5  '$ Recall '2 2 3  '$ Language- name 2 objects '2 2 2  '$ Language- repeat '1 1 1  '$ Language- follow 3 step command '3 3 3  '$ Language- read & follow direction '1 1 1  '$ Write a sentence '1 1 1  '$ Copy design '1 1 1  '$ Total score '23 25 27   '$ Cranial Nerves: Pupils equal, briskly reactive to light. Extraocular movements full without nystagmus. Visual fields full to confrontation. Hearing significantly diminished bilaterally facial sensation intact. Face, tongue,  palate moves normally and symmetrically.  Motor: Normal bulk and tone. Normal strength in all tested extremity muscles except mild weakness of bilateral grip and hip flexors.. Sensory.: intact to touch , pinprick , position and vibratory sensation.  Coordination: Rapid alternating movements normal in all extremities. Finger-to-nose and heel-to-shin performed accurately bilaterally. Gait and Station: Arises from chair with slight difficulty. Stance is stooped l. Gait demonstrates normal stride length and only slight imbalance with use of walking stick.  Not able to heel, toe and tandem walk    Reflexes: 1+ and symmetric. Toes downgoing.        ASSESSMENT/PLAN: 77 year old Caucasian male with cognitive deterioration and memory loss for the last  5 months following cardiac surgery likely due to mild vascular dementia.  He has shown some improvement on starting Namenda and vitamin B12 replacement.  History of remote right pontine infarct in 2013 and suspect left frontal and right parietal infarcts following cardiac surgery in December 2021.  Vascular risk factors of coronary artery disease, left ventricular thrombus, perioperative atrial fibrillation, diabetes, hypertension, hyperlipidemia and age     -Continue Namenda 10 mg twice daily.  -Start sertraline 25 mg nightly for suspected underlying depression/anxiety.  Unable to adequately complete depression scale.  Has follow-up with  PCP next month who can further manage at that time -Continue vitamin B12 replacement as per primary physician  -Discussed importance of routine cognitively challenging activities like solving crossword puzzles, playing bridge and sodoku as well as routine physical activity, managing vascular risk factors, adequate sleep and healthy diet.  We also discussed memory compensation strategies.      Follow-up in 6 months or call earlier if needed    CC:  Burnard Bunting, MD   I spent 37 minutes of face-to-face and  non-face-to-face time with patient and family.  This included previsit chart review, lab review, study review, order entry, electronic health record documentation, patient and family education and discussion regarding above diagnoses and treatment plan and answered all other questions to patient and family satisfaction   Frann Rider, [facility]  [facility] [address] [place], [place] [address]  Phone [phone] Fax [phone] Note: This document was prepared with digital dictation and possible smart phrase technology. Any transcriptional errors that result from this process are unintentional.

## 2022-09-25 ENCOUNTER — Ambulatory Visit: Payer: Medicare HMO | Admitting: Adult Health

## 2022-09-25 ENCOUNTER — Encounter: Payer: Self-pay | Admitting: Adult Health

## 2022-09-25 VITALS — BP 125/67 | HR 69 | Ht 70.0 in | Wt 180.8 lb

## 2022-09-25 DIAGNOSIS — E538 Deficiency of other specified B group vitamins: Secondary | ICD-10-CM

## 2022-09-25 DIAGNOSIS — R413 Other amnesia: Secondary | ICD-10-CM

## 2022-09-25 DIAGNOSIS — F0150 Vascular dementia without behavioral disturbance: Secondary | ICD-10-CM | POA: Diagnosis not present

## 2022-09-25 DIAGNOSIS — E559 Vitamin D deficiency, unspecified: Secondary | ICD-10-CM | POA: Diagnosis not present

## 2022-09-25 DIAGNOSIS — E611 Iron deficiency: Secondary | ICD-10-CM | POA: Diagnosis not present

## 2022-09-25 DIAGNOSIS — E039 Hypothyroidism, unspecified: Secondary | ICD-10-CM

## 2022-09-25 MED ORDER — MEMANTINE HCL 10 MG PO TABS: 10.0000 mg | ORAL_TABLET | Freq: Two times a day (BID) | ORAL | 3 refills | Status: DC

## 2022-09-25 NOTE — Patient Instructions (Addendum)
Can try to hold sertraline for the next week to see if fatigue symptoms improve   If anger/outbursts worsen, please restart at '25mg'$  dosage and we can consider increasing dosage   We will check lab work today - will keep you updated via MyChart regarding results   Continue Namenda '10mg'$  twice daily   Try to increase daily activity and mentally stimulating activities as this will help slow memory decline    Follow up in 6 months or call earlier if needed

## 2022-09-27 LAB — IRON,TIBC AND FERRITIN PANEL
Ferritin: 49 ng/mL (ref 30–400)
Iron Saturation: 12 % — ABNORMAL LOW (ref 15–55)
Iron: 46 ug/dL (ref 38–169)
Total Iron Binding Capacity: 376 ug/dL (ref 250–450)
UIBC: 330 ug/dL (ref 111–343)

## 2022-09-27 LAB — VITAMIN D 25 HYDROXY (VIT D DEFICIENCY, FRACTURES): Vit D, 25-Hydroxy: 44.6 ng/mL (ref 30.0–100.0)

## 2022-09-27 LAB — VITAMIN B12: Vitamin B-12: >2000 pg/mL — ABNORMAL HIGH (ref 232–1245)

## 2022-09-27 LAB — CBC
Hematocrit: 37.2 % — ABNORMAL LOW (ref 37.5–51.0)
Hemoglobin: 12 g/dL — ABNORMAL LOW (ref 13.0–17.7)
MCH: 29.4 pg (ref 26.6–33.0)
MCHC: 32.3 g/dL (ref 31.5–35.7)
MCV: 91 fL (ref 79–97)
Platelets: 283 10*3/uL (ref 150–450)
RBC: 4.08 x10E6/uL — ABNORMAL LOW (ref 4.14–5.80)
RDW: 13.1 % (ref 11.6–15.4)
WBC: 7.7 10*3/uL (ref 3.4–10.8)

## 2022-09-27 LAB — THYROID PANEL
Free Thyroxine Index: 2.4 (ref 1.2–4.9)
T3 Uptake Ratio: 37 % (ref 24–39)
T4, Total: 6.4 ug/dL (ref 4.5–12.0)

## 2022-09-28 ENCOUNTER — Emergency Department (HOSPITAL_COMMUNITY): Payer: Medicare HMO

## 2022-09-28 ENCOUNTER — Emergency Department (HOSPITAL_COMMUNITY)
Admission: EM | Admit: 2022-09-28 | Discharge: 2022-09-28 | Disposition: A | Discharge status: Home / Self Care | Payer: Medicare HMO | Attending: Emergency Medicine | Admitting: Emergency Medicine

## 2022-09-28 ENCOUNTER — Other Ambulatory Visit: Payer: Self-pay

## 2022-09-28 DIAGNOSIS — I2510 Atherosclerotic heart disease of native coronary artery without angina pectoris: Secondary | ICD-10-CM | POA: Insufficient documentation

## 2022-09-28 DIAGNOSIS — Z7984 Long term (current) use of oral hypoglycemic drugs: Secondary | ICD-10-CM | POA: Diagnosis not present

## 2022-09-28 DIAGNOSIS — Z8673 Personal history of transient ischemic attack (TIA), and cerebral infarction without residual deficits: Secondary | ICD-10-CM | POA: Diagnosis not present

## 2022-09-28 DIAGNOSIS — R059 Cough, unspecified: Secondary | ICD-10-CM | POA: Diagnosis not present

## 2022-09-28 DIAGNOSIS — Z85828 Personal history of other malignant neoplasm of skin: Secondary | ICD-10-CM | POA: Insufficient documentation

## 2022-09-28 DIAGNOSIS — Z8616 Personal history of COVID-19: Secondary | ICD-10-CM | POA: Insufficient documentation

## 2022-09-28 DIAGNOSIS — I5022 Chronic systolic (congestive) heart failure: Secondary | ICD-10-CM | POA: Insufficient documentation

## 2022-09-28 DIAGNOSIS — U071 COVID-19: Secondary | ICD-10-CM | POA: Insufficient documentation

## 2022-09-28 DIAGNOSIS — Z951 Presence of aortocoronary bypass graft: Secondary | ICD-10-CM | POA: Diagnosis not present

## 2022-09-28 DIAGNOSIS — R4182 Altered mental status, unspecified: Secondary | ICD-10-CM | POA: Diagnosis not present

## 2022-09-28 DIAGNOSIS — Z96653 Presence of artificial knee joint, bilateral: Secondary | ICD-10-CM | POA: Diagnosis not present

## 2022-09-28 DIAGNOSIS — Z7901 Long term (current) use of anticoagulants: Secondary | ICD-10-CM | POA: Insufficient documentation

## 2022-09-28 DIAGNOSIS — N1831 Chronic kidney disease, stage 3a: Secondary | ICD-10-CM | POA: Insufficient documentation

## 2022-09-28 DIAGNOSIS — I130 Hypertensive heart and chronic kidney disease with heart failure and stage 1 through stage 4 chronic kidney disease, or unspecified chronic kidney disease: Secondary | ICD-10-CM | POA: Diagnosis not present

## 2022-09-28 DIAGNOSIS — Z79899 Other long term (current) drug therapy: Secondary | ICD-10-CM | POA: Insufficient documentation

## 2022-09-28 DIAGNOSIS — Z794 Long term (current) use of insulin: Secondary | ICD-10-CM | POA: Diagnosis not present

## 2022-09-28 DIAGNOSIS — R531 Weakness: Secondary | ICD-10-CM | POA: Diagnosis not present

## 2022-09-28 DIAGNOSIS — F0390 Unspecified dementia without behavioral disturbance: Secondary | ICD-10-CM | POA: Insufficient documentation

## 2022-09-28 DIAGNOSIS — E119 Type 2 diabetes mellitus without complications: Secondary | ICD-10-CM | POA: Diagnosis not present

## 2022-09-28 DIAGNOSIS — I10 Essential (primary) hypertension: Secondary | ICD-10-CM | POA: Diagnosis not present

## 2022-09-28 LAB — URINALYSIS, ROUTINE W REFLEX MICROSCOPIC
Bilirubin Urine: NEGATIVE
Glucose, UA: >=500 mg/dL — AB
Hgb urine dipstick: NEGATIVE
Ketones, ur: NEGATIVE mg/dL
Leukocytes,Ua: NEGATIVE
Nitrite: NEGATIVE
Protein, ur: NEGATIVE mg/dL
Specific Gravity, Urine: 1.01 (ref 1.005–1.030)
pH: 5.5 (ref 5.0–8.0)

## 2022-09-28 LAB — CBC WITH DIFFERENTIAL/PLATELET
Abs Immature Granulocytes: 0.03 10*3/uL (ref 0.00–0.07)
Basophils Absolute: 0 10*3/uL (ref 0.0–0.1)
Basophils Relative: 1 %
Eosinophils Absolute: 0 10*3/uL (ref 0.0–0.5)
Eosinophils Relative: 0 %
HCT: 36.9 % — ABNORMAL LOW (ref 39.0–52.0)
Hemoglobin: 11.8 g/dL — ABNORMAL LOW (ref 13.0–17.0)
Immature Granulocytes: 0 %
Lymphocytes Relative: 18 %
Lymphs Abs: 1.5 10*3/uL (ref 0.7–4.0)
MCH: 30 pg (ref 26.0–34.0)
MCHC: 32 g/dL (ref 30.0–36.0)
MCV: 93.9 fL (ref 80.0–100.0)
Monocytes Absolute: 0.9 10*3/uL (ref 0.1–1.0)
Monocytes Relative: 11 %
Neutro Abs: 5.7 10*3/uL (ref 1.7–7.7)
Neutrophils Relative %: 70 %
Platelets: 236 10*3/uL (ref 150–400)
RBC: 3.93 MIL/uL — ABNORMAL LOW (ref 4.22–5.81)
RDW: 14.2 % (ref 11.5–15.5)
WBC: 8.2 10*3/uL (ref 4.0–10.5)
nRBC: 0 % (ref 0.0–0.2)

## 2022-09-28 LAB — URINALYSIS, MICROSCOPIC (REFLEX): Squamous Epithelial / HPF: NONE SEEN /HPF (ref 0–5)

## 2022-09-28 LAB — COMPREHENSIVE METABOLIC PANEL
ALT: 12 U/L (ref 0–44)
AST: 27 U/L (ref 15–41)
Albumin: 3.2 g/dL — ABNORMAL LOW (ref 3.5–5.0)
Alkaline Phosphatase: 44 U/L (ref 38–126)
Anion gap: 8 (ref 5–15)
BUN: 26 mg/dL — ABNORMAL HIGH (ref 8–23)
CO2: 20 mmol/L — ABNORMAL LOW (ref 22–32)
Calcium: 8.1 mg/dL — ABNORMAL LOW (ref 8.9–10.3)
Chloride: 108 mmol/L (ref 98–111)
Creatinine, Ser: 1.58 mg/dL — ABNORMAL HIGH (ref 0.61–1.24)
GFR, Estimated: 45 mL/min — ABNORMAL LOW (ref >60)
Glucose, Bld: 248 mg/dL — ABNORMAL HIGH (ref 70–99)
Potassium: 3.7 mmol/L (ref 3.5–5.1)
Sodium: 136 mmol/L (ref 135–145)
Total Bilirubin: 0.6 mg/dL (ref 0.3–1.2)
Total Protein: 6.1 g/dL — ABNORMAL LOW (ref 6.5–8.1)

## 2022-09-28 LAB — RESP PANEL BY RT-PCR (RSV, FLU A&B, COVID)  RVPGX2
Influenza A by PCR: NEGATIVE
Influenza B by PCR: NEGATIVE
Resp Syncytial Virus by PCR: NEGATIVE
SARS Coronavirus 2 by RT PCR: POSITIVE — AB

## 2022-09-28 MED ORDER — SODIUM CHLORIDE 0.9 % IV BOLUS
500.0000 mL | Freq: Once | INTRAVENOUS | Status: AC
  Administered 2022-09-28: 500 mL via INTRAVENOUS

## 2022-09-28 NOTE — ED Triage Notes (Addendum)
Pt BIB [facility] from home. Per EMS family reported that pt has become very weak today. Pt is normally ambulatory with a walker but has been unable to this afternoon. Pts daughter was recently dx with COVID and believes he may have it now. Pt has no complaints in triage.

## 2022-09-28 NOTE — ED Notes (Signed)
Pt ambulated from room out into the hall. Pt denied any difficulties breathing or any other complaints. Pt now resting in bed

## 2022-09-28 NOTE — ED Notes (Signed)
Pt given paper scrubs to wear at discharge

## 2022-09-28 NOTE — ED Provider Notes (Signed)
[facility] Provider Note  CSN: [id number] Arrival date & time: 09/28/22 1936  Chief Complaint(s) Weakness  HPI Patrick Miranda is a 77 y.o. male with history of prior stroke, coronary artery disease, dementia, CHF, prior PE on Eliquis, diabetes presenting with weakness.  Patient reports generalized weakness starting today.  He reports sore throat but denies any other symptoms.  Patient's daughter reports that he has also been coughing and seemed more confused than typical.  No other symptoms such as difficulty breathing, fevers, abdominal pain, nausea or vomiting, diarrhea, painful urination.  Patient's daughter recently diagnosed with COVID.  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 s/p CABG in 06/2020 with LIMA-LAD, SVG-D1 and SVG-RCA   COVID-07 Dec 2019   Dementia [facility])    Depression    Essential hypertension    Hernia, umbilical    HOH (hard of hearing)    Hyperlipidemia    Ischemic cardiomyopathy    Ischemic necrosis of small bowel ([place])    Left ventricular mural thrombus    NSTEMI (non-ST elevated myocardial infarction) ([place])    OSA on CPAP    Pulmonary emboli [facility])    May 2021   Skin cancer    Type 2 diabetes mellitus [facility])    Patient Active Problem List   Diagnosis Date Noted   Lower urinary tract infectious disease    Chronic systolic HF (heart failure) ([place])    Sepsis ([place]) 05/18/2021   BPH with obstruction/lower urinary tract symptoms 05/14/2021   Dementia ([place]) [id number]   Umbilical hernia, incarcerated [id number]   Metabolic encephalopathy [id number]   Abdominal pain 08/03/2020   Chronic kidney disease, stage 3a ([place]) 08/03/2020   Mixed diabetic hyperlipidemia associated with type 2 diabetes mellitus ([place]) 08/03/2020   Type 2 diabetes mellitus with hyperglycemia, with long-term  current use of insulin ([place]) 08/03/2020   Foodborne gastroenteritis 08/03/2020   SIRS (systemic inflammatory response syndrome) ([place]) 08/03/2020   Postop check 07/25/2020   Hx of CABG 07/09/2020   Coronary artery disease involving native heart 07/04/2020   Abnormal nuclear stress test    Atrial fibrillation, chronic ([place]) 12/14/2019   Hypersomnia 08/30/2019   History of adenomatous polyp of colon 05/06/2018   Vasomotor rhinitis 07/18/2017   Arthritis of knee, right 01/06/2014   CVA (cerebral vascular accident) ([place]) 09/07/2011    Class: Acute   Dysarthria 09/05/2011    Class: Acute   Primary hypertension 09/05/2011    Class: Chronic   OSA on CPAP 09/05/2011    Class: Chronic   Anemia 09/05/2011    Class: Chronic   Home Medication(s) Prior to Admission medications   Medication Sig Start Date End Date Taking? Authorizing Provider  acetaminophen (TYLENOL) 325 MG tablet Take 2 tablets (650 mg total) by mouth every 6 (six) hours as needed for mild pain or headache (or Fever >/= 101). 08/10/20   Domenic Polite, MD  apixaban (ELIQUIS) 5 MG TABS tablet TAKE 1 TABLET(5 MG) BY MOUTH TWICE DAILY 02/25/22   Satira Sark, MD  BD PEN NEEDLE NANO U/F 32G X 4 MM MISC 1 each by Other route daily.  03/19/18   [provider]  Biotin 5000 MCG TABS Take 5,000 mcg by mouth daily. Patient not taking: Reported on 09/25/2022    [provider]  Continuous Blood Gluc Sensor (FREESTYLE LIBRE 2 SENSOR) MISC See admin instructions. 10/31/20   [provider]  Cyanocobalamin (B-12) 5000 MCG SUBL Place 5,000 mcg under the tongue daily.    [provider]  empagliflozin ([name]) 10 MG TABS tablet Take 1 tablet (10 mg total) by mouth daily before breakfast. 07/04/22   Satira Sark, MD  fenofibrate 160 MG tablet 1 capsule with a meal Orally Once a day 07/15/17   [provider]  Insulin Aspart (NOVOLOG FLEXPEN [place]) Inject 0-15 Units into the skin 3 (three) times  daily before meals. Dose per sliding scale    [provider]  losartan (COZAAR) 25 MG tablet TAKE 1/2 TABLET(12.5 MG) BY MOUTH DAILY 12/18/21   Satira Sark, MD  memantine (NAMENDA) 10 MG tablet Take 1 tablet (10 mg total) by mouth 2 (two) times daily. 09/25/22   Frann Rider, NP  metFORMIN (GLUCOPHAGE) 500 MG tablet Take 500 mg by mouth 2 (two) times daily with a meal. 10/10/20   [provider]  metoprolol succinate (TOPROL-XL) 25 MG 24 hr tablet Take 0.5 tablets (12.5 mg total) by mouth daily. 09/13/21   Strader, Fransisco Hertz, PA-C  mirabegron ER (MYRBETRIQ) 25 MG TB24 tablet Take 25 mg by mouth daily.    [provider]  Multiple Vitamin (MULTIVITAMIN WITH MINERALS) TABS tablet Take 1 tablet by mouth daily.    [provider]  [facility] VERIO test strip 1 each by Other route in the morning, at noon, and at bedtime.  03/20/18   [provider]  rosuvastatin (CRESTOR) 20 MG tablet TAKE 1 TABLET(20 MG) BY MOUTH DAILY 09/04/22   Satira Sark, MD  sertraline (ZOLOFT) 25 MG tablet Take 1 tablet (25 mg total) by mouth daily. 09/11/22   Frann Rider, NP  tamsulosin (FLOMAX) 0.4 MG CAPS capsule Take 0.4 mg by mouth daily.    [provider]  TOUJEO SOLOSTAR 300 UNIT/ML Solostar Pen Inject 15 Units into the skin daily. 05/21/21   Barton Dubois, MD                                                                                                                                    Past Surgical History Past Surgical History:  Procedure Laterality Date   COLON SURGERY     COLONOSCOPY W/ POLYPECTOMY     CORONARY ARTERY BYPASS GRAFT N/A 07/09/2020   Procedure: CORONARY ARTERY BYPASS GRAFTING (CABG) TIMES THREE , USING LEFT INTERNAL MAMMARY ARTERY AND RIGHT LEG GREATER SAPHENOUS VEIN HARVESTED ENDOSCOPICALLY;  Surgeon: Ivin Poot, MD;  Location: [place];  Service: Open Heart Surgery;  Laterality: N/A;   LEFT HEART CATH AND CORONARY ANGIOGRAPHY N/A  07/04/2020   Procedure: LEFT HEART CATH AND CORONARY ANGIOGRAPHY;  Surgeon: Troy Sine, MD;  Location: [facility];  Service: Cardiovascular;  Laterality: N/A;   TEE WITHOUT CARDIOVERSION N/A 07/09/2020  Procedure: TRANSESOPHAGEAL ECHOCARDIOGRAM (TEE);  Surgeon: Prescott Gum, Collier Salina, MD;  Location: [facility];  Service: Open Heart Surgery;  Laterality: N/A;   TOTAL KNEE ARTHROPLASTY Left 04/22/2013   Procedure: TOTAL KNEE ARTHROPLASTY- left;  Surgeon: Augustin Schooling, MD;  Location: [facility];  Service: Orthopedics;  Laterality: Left;  with femoral block   TOTAL KNEE ARTHROPLASTY Right 01/06/2014   Procedure: RIGHT TOTAL KNEE ARTHROPLASTY;  Surgeon: Augustin Schooling, MD;  Location: [facility];  Service: Orthopedics;  Laterality: Right;   TRANSURETHRAL RESECTION OF PROSTATE N/A 05/14/2021   Procedure: TRANSURETHRAL RESECTION OF THE PROSTATE (TURP);  Surgeon: Robley Fries, MD;  Location: [facility];  Service: Urology;  Laterality: N/A;  99 MINS   Family History Family History  Problem Relation Age of Onset   CAD Brother        Premature   CAD Sister     Social History Social History   Tobacco Use   Smoking status: Never   Smokeless tobacco: Never  Vaping Use   Vaping Use: Never used  Substance Use Topics   Alcohol use: No   Drug use: No   Allergies Lipitor [atorvastatin] and Sitagliptin  Review of Systems Review of Systems  All other systems reviewed and are negative.   Physical Exam Vital Signs  I have reviewed the triage vital signs BP 130/60   Pulse 62   Temp 98.4 F (36.9 C) (Oral)   Resp 20   Ht '5\' 10"'$  (1.778 m)   Wt 82 kg   SpO2 95%   BMI 25.94 kg/m  Physical Exam Vitals and nursing note reviewed.  Constitutional:      General: He is not in acute distress.    Appearance: Normal appearance.  HENT:     Mouth/Throat:     Mouth: Mucous membranes are moist.  Eyes:     Conjunctiva/sclera: Conjunctivae normal.  Cardiovascular:     Rate and Rhythm: Normal rate  and regular rhythm.  Pulmonary:     Effort: Pulmonary effort is normal. No respiratory distress.     Breath sounds: Normal breath sounds.  Abdominal:     General: Abdomen is flat.     Palpations: Abdomen is soft.     Tenderness: There is no abdominal tenderness.  Musculoskeletal:     Right lower leg: No edema.     Left lower leg: No edema.  Skin:    General: Skin is warm and dry.     Capillary Refill: Capillary refill takes less than 2 seconds.  Neurological:     Mental Status: He is alert and oriented to person, place, and time. Mental status is at baseline.  Psychiatric:        Mood and Affect: Mood normal.        Behavior: Behavior normal.     ED Results and Treatments Labs (all labs ordered are listed, but only abnormal results are displayed) Labs Reviewed  RESP PANEL BY RT-PCR (RSV, FLU A&B, COVID)  RVPGX2 - Abnormal; Notable for the following components:      Result Value   SARS Coronavirus 2 by RT PCR POSITIVE (*)    All other components within normal limits  COMPREHENSIVE METABOLIC PANEL - Abnormal; Notable for the following components:   CO2 20 (*)    Glucose, Bld 248 (*)    BUN 26 (*)    Creatinine, Ser 1.58 (*)    Calcium 8.1 (*)    Total Protein 6.1 (*)    Albumin 3.2 (*)  GFR, Estimated 45 (*)    All other components within normal limits  CBC WITH DIFFERENTIAL/PLATELET - Abnormal; Notable for the following components:   RBC 3.93 (*)    Hemoglobin 11.8 (*)    HCT 36.9 (*)    All other components within normal limits  URINALYSIS, ROUTINE W REFLEX MICROSCOPIC - Abnormal; Notable for the following components:   Glucose, UA >=500 (*)    All other components within normal limits  URINALYSIS, MICROSCOPIC (REFLEX) - Abnormal; Notable for the following components:   Bacteria, UA RARE (*)    All other components within normal limits                                                                                                                           Radiology DG Chest Port 1 View  Result Date: 09/28/2022 CLINICAL DATA:  Weakness.  Cough. EXAM: PORTABLE CHEST 1 VIEW COMPARISON:  Chest radiograph 05/18/2021. FINDINGS: Monitoring leads overlie the patient. Stable cardiac and mediastinal contours. Aortic tortuosity. No large area pulmonary consolidation. No pleural effusion or pneumothorax. Thoracic spine degenerative changes. IMPRESSION: No active disease. Electronically Signed   By: Lovey Newcomer M.D.   On: 09/28/2022 20:50   CT Head Wo Contrast  Result Date: 09/28/2022 CLINICAL DATA:  Mental status change EXAM: CT HEAD WITHOUT CONTRAST TECHNIQUE: Contiguous axial images were obtained from the base of the skull through the vertex without intravenous contrast. RADIATION DOSE REDUCTION: This exam was performed according to the departmental dose-optimization program which includes automated exposure control, adjustment of the mA and/or kV according to patient size and/or use of iterative reconstruction technique. COMPARISON:  Head CT 05/15/2021 FINDINGS: Brain: No evidence of acute infarction, hemorrhage, hydrocephalus, extra-axial collection or mass lesion/mass effect. Old infarcts in the left frontal lobe, right occipital lobe and right parietal lobe appear unchanged. There is stable periventricular white matter hypodensity, likely chronic small vessel ischemic change. Vascular: Atherosclerotic calcifications are present within the cavernous internal carotid arteries. Skull: Normal. Negative for fracture or focal lesion. Sinuses/Orbits: No acute finding. Other: None. IMPRESSION: 1. No acute intracranial abnormality. 2. Stable old infarcts and chronic small vessel ischemic change. Electronically Signed   By: Ronney Asters M.D.   On: 09/28/2022 20:30    Pertinent labs & imaging results that were available during my care of the patient were reviewed by me and considered in my medical decision making (see MDM for details).  Medications Ordered in  ED Medications  sodium chloride 0.9 % bolus 500 mL (0 mLs Intravenous Stopped 09/28/22 2139)  Procedures Procedures  (including critical care time)  Medical Decision Making / ED Course   MDM:  77 year old male presenting to the emergency department generalized weakness.  Patient overall well-appearing, examination nonfocal.  Lungs clear, no abdominal tenderness.  Given weakness, will obtain laboratory workup to evaluate for toxic or metabolic causes such as electrolyte abnormalities or AKI, will also evaluate for infectious process with chest x-ray, COVID test, urinalysis.  CT head stable without acute process and patient has no focal neurologic symptoms, aside from mild confusion is roughly at baseline.  Will reassess. Clinical Course as of 09/28/22 2330  Nancy Fetter Sep 28, 2022  2040 SARS Coronavirus 2 by RT PCR(!): POSITIVE [WS]  [id number] Metabolic/toxic workup otherwise unremarkable. Suspect symptoms due to COVID. Patient able to ambulate with walker. No sign of UTI. Patient on apixaban so unable to prescribe paxlovid. Will discharge patient to home. All questions answered. Patient comfortable with plan of discharge. Return precautions discussed with patient and specified on the after visit summary.  [WS]    Clinical Course User Index [WS] Cristie Hem, MD     Additional history obtained: -Additional history obtained from family and ems -External records from outside source obtained and reviewed including: Chart review including previous notes, labs, imaging, consultation notes including neurologist note 09/24/21   Lab Tests: -I ordered, reviewed, and interpreted labs.   The pertinent results include:   Labs Reviewed  RESP PANEL BY RT-PCR (RSV, FLU A&B, COVID)  RVPGX2 - Abnormal; Notable for the following components:      Result Value   SARS  Coronavirus 2 by RT PCR POSITIVE (*)    All other components within normal limits  COMPREHENSIVE METABOLIC PANEL - Abnormal; Notable for the following components:   CO2 20 (*)    Glucose, Bld 248 (*)    BUN 26 (*)    Creatinine, Ser 1.58 (*)    Calcium 8.1 (*)    Total Protein 6.1 (*)    Albumin 3.2 (*)    GFR, Estimated 45 (*)    All other components within normal limits  CBC WITH DIFFERENTIAL/PLATELET - Abnormal; Notable for the following components:   RBC 3.93 (*)    Hemoglobin 11.8 (*)    HCT 36.9 (*)    All other components within normal limits  URINALYSIS, ROUTINE W REFLEX MICROSCOPIC - Abnormal; Notable for the following components:   Glucose, UA >=500 (*)    All other components within normal limits  URINALYSIS, MICROSCOPIC (REFLEX) - Abnormal; Notable for the following components:   Bacteria, UA RARE (*)    All other components within normal limits    Notable for positive covid. No UTI. Mild hyperglycemia.   EKG   EKG Interpretation  Date/Time:  Sunday September 28 2022 19:45:28 EDT Ventricular Rate:  82 PR Interval:  123 QRS Duration: 135 QT Interval:  381 QTC Calculation: 445 R Axis:   87 Text Interpretation: Sinus rhythm Nonspecific intraventricular conduction delay Nonspecific repol abnormality, diffuse leads Confirmed by Garnette Gunner [id number]) on 09/28/2022 10:56:48 PM         Imaging Studies ordered: I ordered imaging studies including CT head On my interpretation imaging demonstrates no acute process I independently visualized and interpreted imaging. 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 s/p CABG in 06/2020 with LIMA-LAD, SVG-D1 and SVG-RCA   COVID-07 Dec 2019   Dementia [facility])    Depression    Essential hypertension    Hernia, umbilical    HOH (hard of hearing)    Hyperlipidemia    Ischemic cardiomyopathy    Ischemic necrosis of small bowel ([place])    Left ventricular mural thrombus    NSTEMI (non-ST elevated myocardial infarction) ([place])    OSA on CPAP    Pulmonary emboli [facility])    May 2021   Skin cancer    Type 2 diabetes mellitus ([place])       Dispostion: Disposition decision including need for hospitalization was considered, and patient discharged from emergency department.    Final Clinical Impression(s) / ED Diagnoses Final diagnoses:  COVID-19     This chart was dictated using voice recognition software.  Despite best efforts to proofread,  errors can occur which can change the documentation meaning.    Cristie Hem, MD 09/28/22 2330

## 2022-09-28 NOTE — ED Notes (Signed)
Patient transported to CT 

## 2022-09-28 NOTE — Discharge Instructions (Addendum)
We evaluated you due to your weakness.  Your laboratory tests were reassuring.  Your COVID test was positive which is likely the cause of your weakness.  Please take Tylenol and Motrin if you develop fevers.  Your symptoms should improve with time.  Please be sure to stay hydrated.

## 2022-10-06 ENCOUNTER — Telehealth: Payer: Self-pay

## 2022-10-06 NOTE — Telephone Encounter (Signed)
     Patient  visit on 3/10  at [facility] you been able to follow up with your primary care physician? Yes   The patient was or was not able to obtain any needed medicine or equipment. Yes   Are there diet recommendations that you are having difficulty following? Na   Patient expresses understanding of discharge instructions and education provided has no other needs at this time.  Yes      [facility] [phone] [address], [place], [place] [address] Phone: [phone] Email: [email or web address]

## 2022-10-07 ENCOUNTER — Telehealth: Payer: Self-pay

## 2022-10-07 NOTE — Patient Outreach (Addendum)
Received a Pharmacy referral for Medication Assistance for [employer].   I have sent a referral to the [facility] Team through [place].    [facility], [place], [place] [facility] [phone]

## 2022-10-15 ENCOUNTER — Telehealth: Payer: Self-pay | Admitting: Pharmacist

## 2022-10-15 NOTE — Progress Notes (Signed)
[facility])  [place] Team   Reason for referral: PAP for numerous medications  [facility] pharmacy referral is being closed due to the following reasons:  Reached out to patient, they said they would call me back with the necessary information to see if they qualify for PAP.  Did not return call. Left additional messages. They do have my contact information and I am happy to help them if able to connect.  [facility] is happy to assist the patient/family in the future for clinical pharmacy needs, following a discussion from your team about [facility] outreach. Thank you for allowing [facility] to be a part of your patient's care.    Curlene Labrum, PharmD [facility] Pharmacist Office: [phone]

## 2022-10-16 DIAGNOSIS — D509 Iron deficiency anemia, unspecified: Secondary | ICD-10-CM | POA: Diagnosis not present

## 2022-10-16 DIAGNOSIS — K5289 Other specified noninfective gastroenteritis and colitis: Secondary | ICD-10-CM | POA: Diagnosis not present

## 2022-10-20 DIAGNOSIS — K5289 Other specified noninfective gastroenteritis and colitis: Secondary | ICD-10-CM | POA: Diagnosis not present

## 2022-10-20 DIAGNOSIS — D509 Iron deficiency anemia, unspecified: Secondary | ICD-10-CM | POA: Diagnosis not present

## 2022-10-29 DIAGNOSIS — N401 Enlarged prostate with lower urinary tract symptoms: Secondary | ICD-10-CM | POA: Diagnosis not present

## 2022-10-29 DIAGNOSIS — N3941 Urge incontinence: Secondary | ICD-10-CM | POA: Diagnosis not present

## 2022-10-29 DIAGNOSIS — R350 Frequency of micturition: Secondary | ICD-10-CM | POA: Diagnosis not present

## 2022-11-08 NOTE — Progress Notes (Unsigned)
Subjective:    Patient ID: Patrick Miranda, male    DOB: Sep 11, 1945, 77 y.o.   MRN: [id number]  HPI M never smoker followed for OSA vasomotor rhinitis, complicated by history brainstem CVA, CAD, DM 2, HBP, anemia. NPSG 2007 AHI 31.3/ hr, CPAP to 9, weight 180 lbs.  ----------------------------------------------------------------------------------------  11/07/21- 77 year old male never smoker followed for OSA, chronic rhinitis, complicated by hx Brainstem CVA, Dementia, CAD/Ischemic CM/ CABG,  DM 2, HBP, anemia, CKD, Laparotomy for Small Bowel Perforation Feb, 2022, CPAP auto 7-13/ [facility] in [place]  AirSense 10 AutoSet Download-compliance 100%, AHI 7.9/ hr Most[facility] Body weight today-178 lbs covid vax-4 Phizer Flu vax-had -----Patient and his wife states that he is having a really hard time finding a mask that works for him.  Download reviewed. Wife confirms he use CPAP every night. He ffinds it hard to keep mask on- complains of mask fit. Discussed.  Deenies significant new cardiac events. CXR 05/18/21- 1V- IMPRESSION: No active disease.  $/86/42- 77 year old male never smoker followed for OSA, chronic rhinitis, complicated by hx Brainstem CVA, Dementia, CAD/Ischemic CM/ CABG,  DM 2, HBP, anemia, CKD, Laparotomy for Small Bowel Perforation Feb, 2022, Covid March2024,  CPAP auto 7-13/ [facility] in [place]  AirSense 10 AutoSet Download-compliance 100%, AHI 14 All [facility] Body weight today-                                                                    Wife here He defers to his wife for answers to many questions but overall says he is sleeping okay.  His mask gets loose frequently.  We discussed central apneas.  He and she would like to try refitting mask for his CPAP machine now rather than doing a BiPAP ST sleep study to see if he qualifies for treatment directed specifically at central apneas.  ROS-see HPI   + = positive Constitutional:    weight  loss, night sweats, fevers, chills, + fatigue, lassitude. HEENT:    headaches, difficulty swallowing, tooth/dental problems, sore throat,       sneezing, itching, ear ache, nasal congestion, post nasal drip, snoring CV:    chest pain, orthopnea, PND, swelling in lower extremities, anasarca,                                                      dizziness, palpitations Resp:   shortness of breath with exertion or at rest.                productive cough,   non-productive cough, coughing up of blood.              change in color of mucus.  wheezing.   Skin:    rash or lesions. GI:  No-   heartburn, indigestion, abdominal pain, nausea, vomiting, GU:  MS:   joint pain, stiffness, Neuro-     nothing unusual Psych:  change in mood or affect.  depression or anxiety.   memory loss.      Objective:   Physical Exam OBJ- Physical Exam General- Alert, Oriented, Affect-appropriate, Distress-  none acute,  Skin- rash-none, lesions- none, excoriation- none Lymphadenopathy- none Head- atraumatic            Eyes- Gross vision intact, PERRLA, conjunctivae and secretions clear            Ears- Hearing, canals-normal            Nose- , no-Septal dev, mucus, polyps, erosion, perforation             Throat- Mallampati II-III , mucosa clear , drainage- none, tonsils- atrophic,hoarse Neck- flexible , trachea midline, no stridor , thyroid nl, carotid no bruit Chest - symmetrical excursion , unlabored           Heart/CV- RRR/ almost regular , no murmur , no gallop  , no rub, nl s1 s2                           - JVD- none , edema- none, stasis changes- none, varices- none           Lung- clear to P&A, wheeze- none, cough-none , dullness-none, rub- none           Chest wall-  Abd-  Br/ Gen/ Rectal- Not done, not indicated Extrem- cyanosis- none, clubbing, none, atrophy- none, strength- nl Neuro- grossly intact to observation  Assessment & Plan:

## 2022-11-10 ENCOUNTER — Ambulatory Visit: Payer: Medicare HMO | Admitting: Internal Medicine

## 2022-11-10 ENCOUNTER — Encounter: Payer: Self-pay | Admitting: Internal Medicine

## 2022-11-10 VITALS — BP 110/62 | HR 63 | Temp 98.0°F | Ht 65.0 in | Wt 182.2 lb

## 2022-11-10 DIAGNOSIS — G4733 Obstructive sleep apnea (adult) (pediatric): Secondary | ICD-10-CM

## 2022-11-10 NOTE — Assessment & Plan Note (Signed)
Benefits from CPAP for his obstructive apneas.  We are not going to address the central apneas directly at this time, as they do not seem to be disturbing his sleep. Plan-DME to refit mask of choice for better fit and seal.  Continue auto 7-13

## 2022-11-10 NOTE — Patient Instructions (Signed)
Order- DME [facility]- continue Auto 7-13. Please refit mask of choice for better seal ann comfort.    Please call if we can help

## 2022-11-10 NOTE — Assessment & Plan Note (Signed)
Wife is taking over more of his communication and interaction, recognizing some degree of dementia.

## 2022-11-11 DIAGNOSIS — I129 Hypertensive chronic kidney disease with stage 1 through stage 4 chronic kidney disease, or unspecified chronic kidney disease: Secondary | ICD-10-CM | POA: Diagnosis not present

## 2022-11-11 DIAGNOSIS — E1129 Type 2 diabetes mellitus with other diabetic kidney complication: Secondary | ICD-10-CM | POA: Diagnosis not present

## 2022-11-11 DIAGNOSIS — I2510 Atherosclerotic heart disease of native coronary artery without angina pectoris: Secondary | ICD-10-CM | POA: Diagnosis not present

## 2022-11-11 DIAGNOSIS — Z794 Long term (current) use of insulin: Secondary | ICD-10-CM | POA: Diagnosis not present

## 2022-11-11 DIAGNOSIS — N1831 Chronic kidney disease, stage 3a: Secondary | ICD-10-CM | POA: Diagnosis not present

## 2022-11-18 DIAGNOSIS — E1129 Type 2 diabetes mellitus with other diabetic kidney complication: Secondary | ICD-10-CM | POA: Diagnosis not present

## 2022-11-18 IMAGING — CT CT CHEST W/ CM
2 of 3 series · 15 of 36 positions shown, 18 images · IV contrast (omnipaque)
Comparison: CTA chest 12/02/2019

CLINICAL DATA: Pleural effusion. History of COVID infection.
Preoperative respiratory evaluation for CABG.

EXAM:
CT CHEST WITH CONTRAST
TECHNIQUE: Multidetector CT imaging of the chest was performed during
intravenous contrast administration.
CONTRAST:  75mL OMNIPAQUE IOHEXOL 350 MG/ML SOLN

[Series 3: chest with 2mm st · axial · 0.84mm/px · z∈[+1068,+1356]mm · 12 of 170 slices shown, 15 images]
[im 13/170  mediastinal]
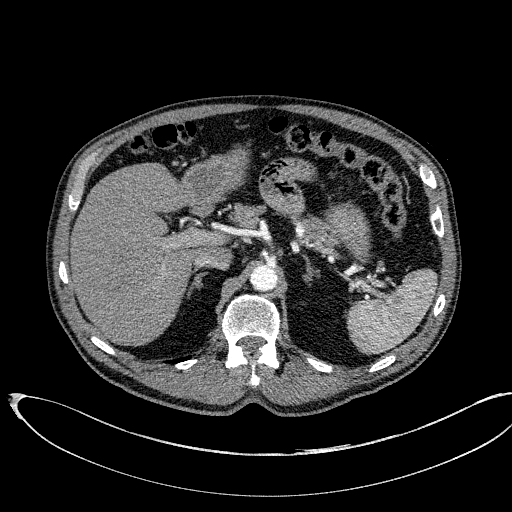
[im 13/170  lung]
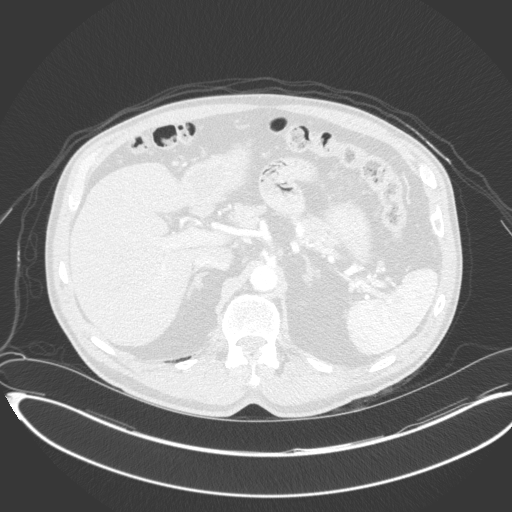
[im 26/170  lung]
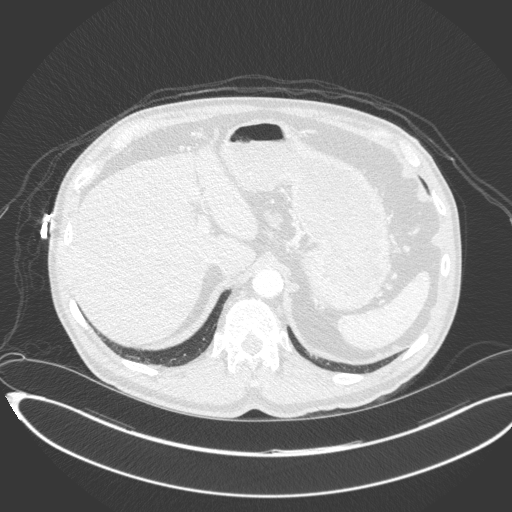
[im 38/170  lung]
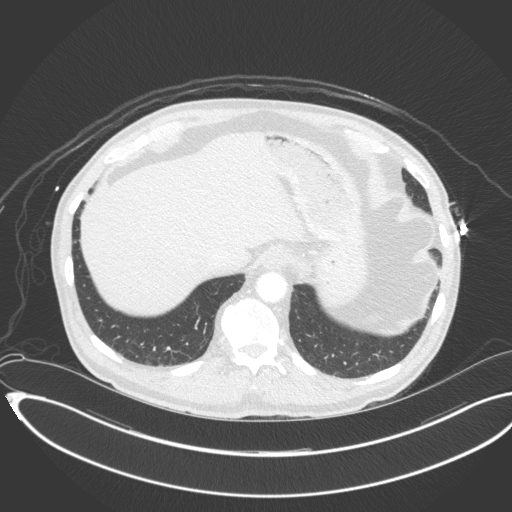
[im 51/170  lung]
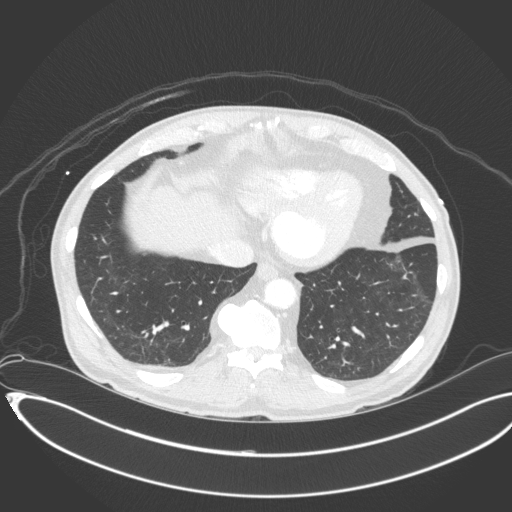
[im 63/170  mediastinal]
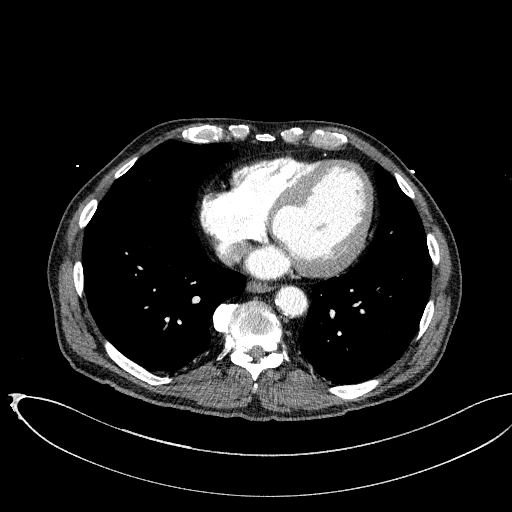
[im 63/170  lung]
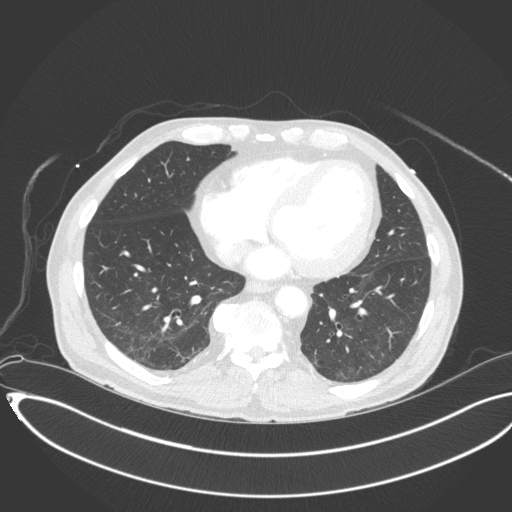
[im 76/170  lung]
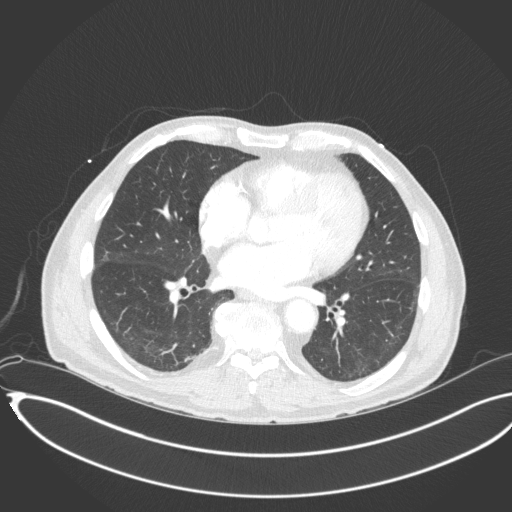
[im 94/170  lung]
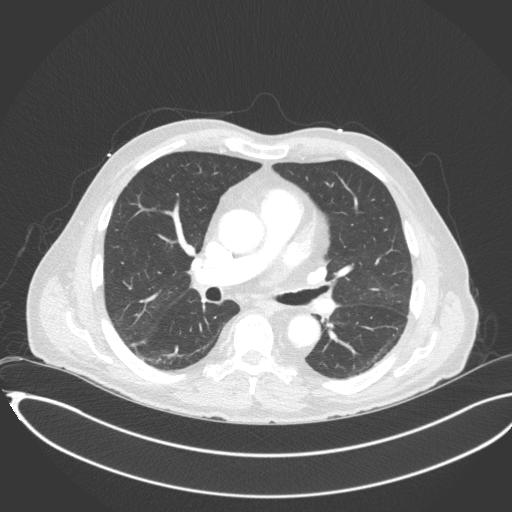
[im 107/170  lung]
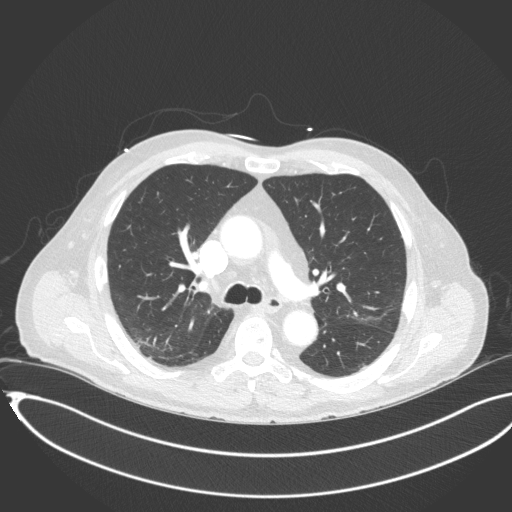
[im 119/170  mediastinal]
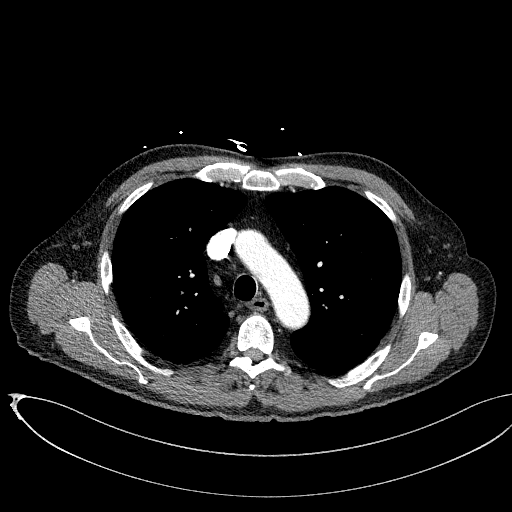
[im 119/170  lung]
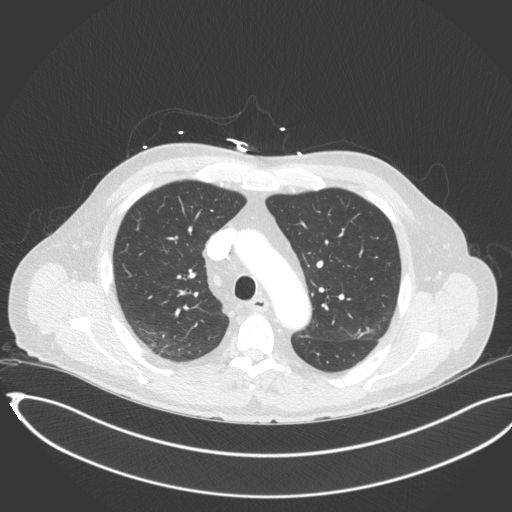
[im 132/170  lung]
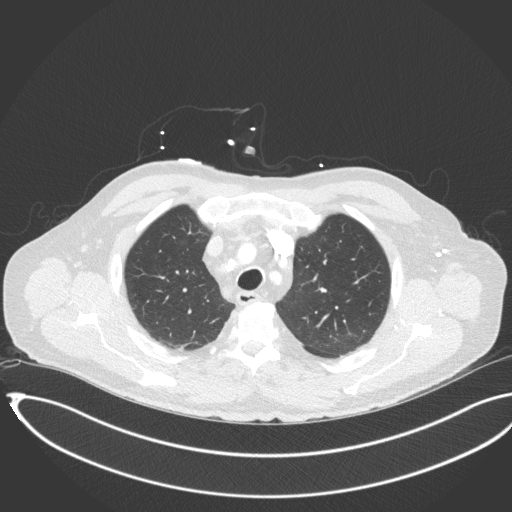
[im 144/170  lung]
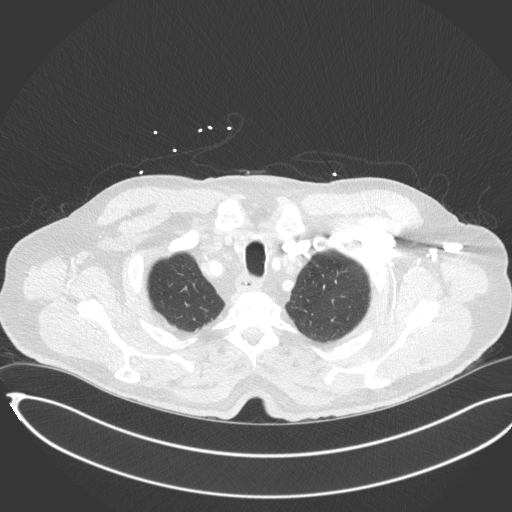
[im 157/170  lung]
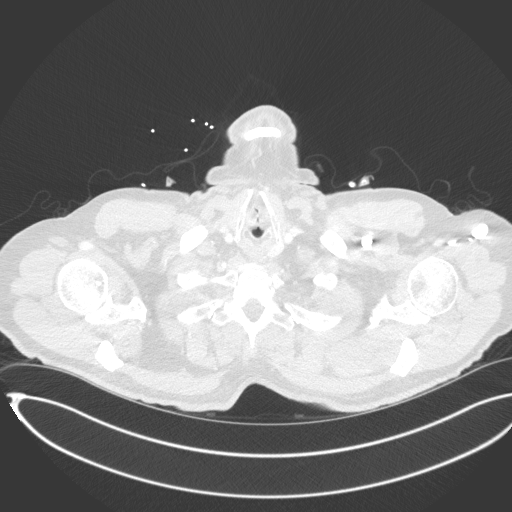

[Series 6: chest with 2mm st cor · coronal · 0.66mm/px · 3 of 131 slices shown]
[im 27/131  lung]
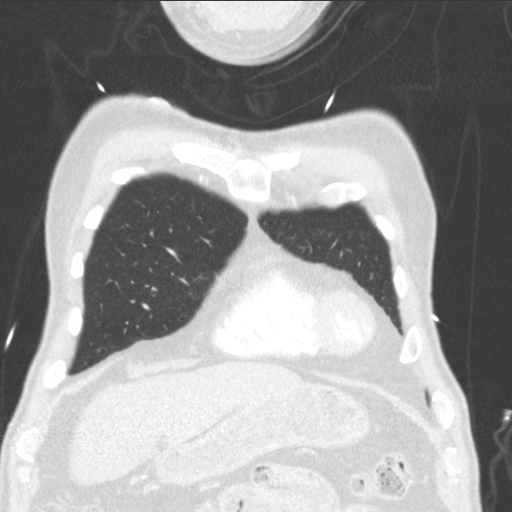
[im 53/131  lung]
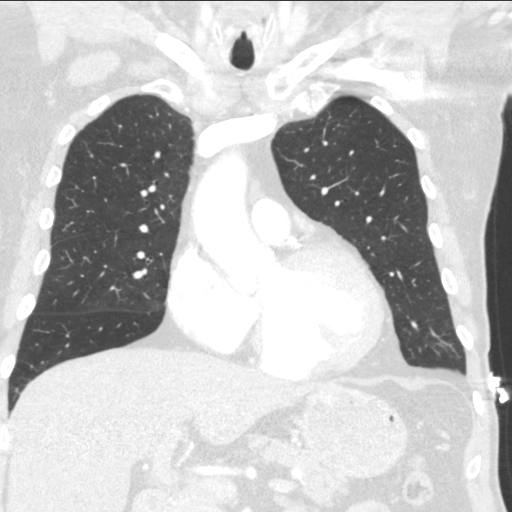
[im 79/131  lung]
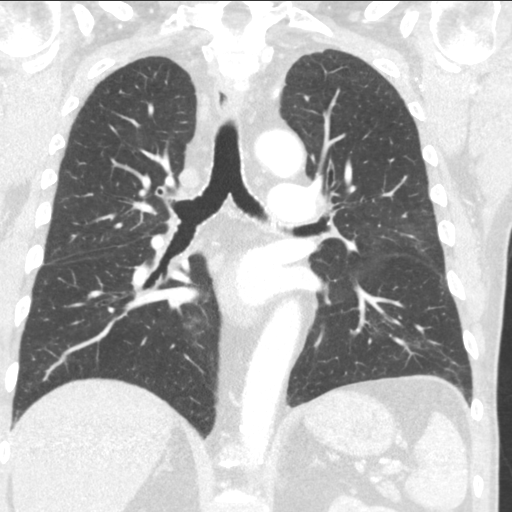

[15 of 36 positions shown; findings below may reference images not displayed]

FINDINGS: Cardiovascular: The heart size is normal. There is marked thinning
of the inferior wall the left ventricle (see coronal image 53 of
series 6 and sagittal image 103 of series 7). This is likely related
to infarct although pseudoaneurysm cannot be excluded. No
substantial pericardial effusion. Coronary artery calcification is
evident. Atherosclerotic calcification is noted in the wall of the
thoracic aorta.

Mediastinum/Nodes: No mediastinal lymphadenopathy. There is no hilar
lymphadenopathy. The esophagus has normal imaging features. There is
no axillary lymphadenopathy.

Lungs/Pleura: Suggestion of underlying subtle changes of
centrilobular emphysema. The peripheral ground-glass and confluent
airspace disease seen on the previous exam has largely resolved in
the interval although there is some persistent architectural
distortion and interstitial scarring in the regions of previously
noted lung disease. No suspicious pulmonary nodule or mass. No dense
focal airspace consolidation. No evidence for pulmonary edema or
pleural effusion.

Upper Abdomen: Unremarkable.

Musculoskeletal: No worrisome lytic or sclerotic osseous
abnormality. Degenerative changes noted right shoulder.
IMPRESSION: 1. Marked thinning of the inferior wall of the left ventricle
towards the base. This is compatible with infarct although
pseudoaneurysm cannot be excluded.
2. Interval resolution of the peripheral ground-glass and confluent
airspace disease seen on the previous exam with some persistent
architectural distortion and interstitial opacity in the regions of
previously noted lung disease. This may reflect incomplete
resolution of changes related to the prior disease or chronic post
infectious/inflammatory scarring.
3. No suspicious pulmonary nodule or mass.
4. Aortic Atherosclerosis ([id number]).

## 2022-11-21 ENCOUNTER — Telehealth: Payer: Self-pay | Admitting: Pharmacist

## 2022-11-21 IMAGING — CR DG CHEST 1V
1 series · 1 of 1 positions shown · non-contrast
Comparison: 07/05/2020

CLINICAL DATA: Open heart surgery

EXAM:
CHEST  1 VIEW

[AP]
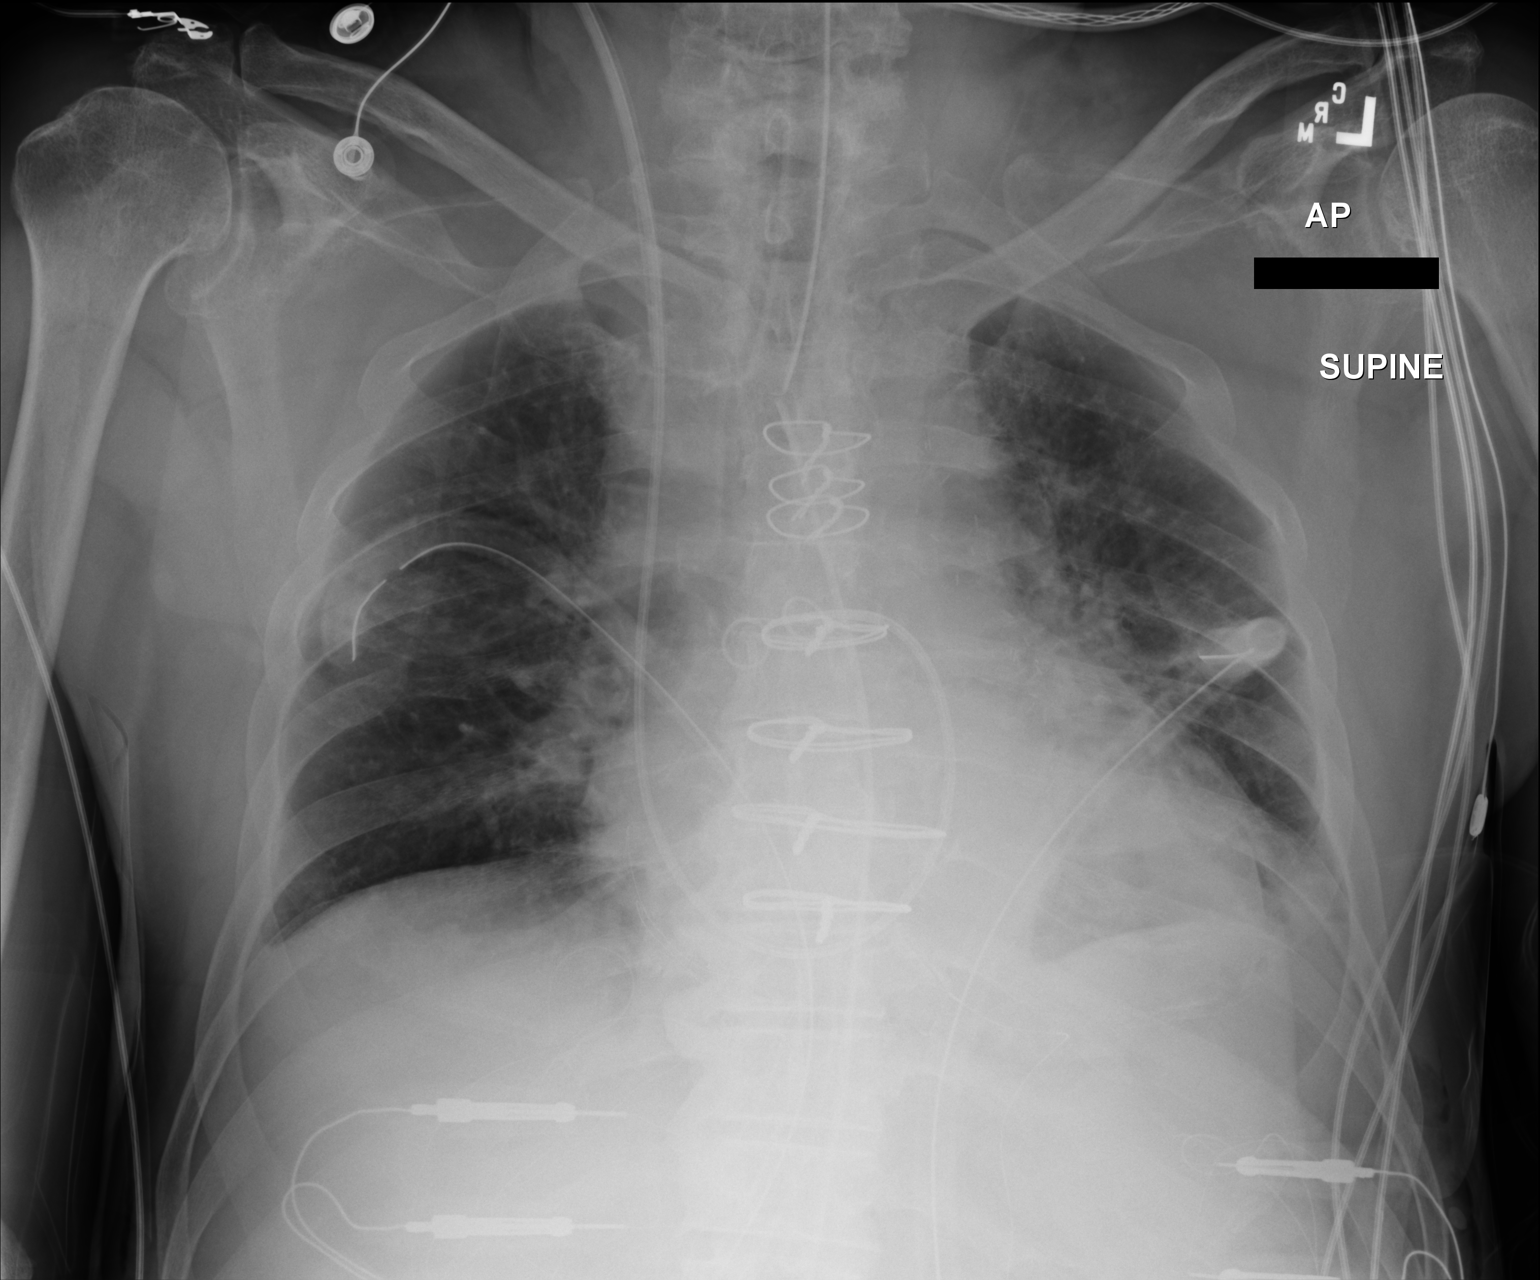

[1 of 1 positions shown; findings below may reference images not displayed]

FINDINGS: Postoperative changes to the chest with median sternotomy wires.
Endotracheal tube terminates 4.2 cm above the carina. Right IJ
Swan-Ganz catheter terminates at the level of the main pulmonary
outflow tract. Bilateral chest tubes and mediastinal drain in place.
Heart size is mildly enlarged. Streaky opacities within the
bilateral mid lung zones and left lung base favor atelectasis. No
pleural effusion or pneumothorax.
IMPRESSION: Postoperative changes to the chest with bilateral atelectasis.
Support apparatus, as above.

## 2022-11-21 NOTE — Progress Notes (Signed)
[facility]) Care Management  [facility] CM Pharmacy   11/21/2022  Yael Fuehrer Faulcon May 05, 1946 [id number]  Reason for referral: medication assistance  Referral source: Lenard Forth Referral medication(s): Eliquis, Novolog, [place],   Current insurance:[employer]  Objective: Allergies  Allergen Reactions   Lipitor [Atorvastatin] Other (See Comments)    Muscle pain   Sitagliptin     Myalgias Other reaction(s): myalgias    Medications Reviewed Today     Reviewed by Legrand Pitts, LPN (Licensed Practical Nurse) on 11/10/22 at 1008  Med List Status: <None>   Medication Order Taking? Sig Documenting Provider Last Dose Status Informant  acetaminophen (TYLENOL) 325 MG tablet [id number] Yes Take 2 tablets (650 mg total) by mouth every 6 (six) hours as needed for mild pain or headache (or Fever >/= 101). Zannie Cove, MD Taking Active Spouse/Significant Other  apixaban (ELIQUIS) 5 MG TABS tablet [id number] Yes TAKE 1 TABLET(5 MG) BY MOUTH TWICE DAILY Jonelle Sidle, MD Taking Active   BD PEN NEEDLE NANO U/F 32G X 4 MM MISC [id number] Yes 1 each by Other route daily.  [provider] Taking Active Spouse/Significant Other  Biotin 5000 MCG TABS [id number] Yes Take 5,000 mcg by mouth daily. [provider] Taking Active Spouse/Significant Other  Continuous Blood Gluc Sensor (FREESTYLE LIBRE 2 SENSOR) MISC [id number] Yes See admin instructions. [provider] Taking Active Spouse/Significant Other  Cyanocobalamin (B-12) 5000 MCG SUBL [id number] Yes Place 5,000 mcg under the tongue daily. [provider] Taking Active Spouse/Significant Other  empagliflozin ([name]) 10 MG TABS tablet [id number] Yes Take 1 tablet (10 mg total) by mouth daily before breakfast. Jonelle Sidle, MD Taking Active   fenofibrate 160 MG tablet [id number] Yes 1 capsule with a meal Orally Once a day [provider] Taking Active   Insulin Aspart  (NOVOLOG FLEXPEN [place]) [id number] Yes Inject 0-15 Units into the skin 3 (three) times daily before meals. Dose per sliding scale [provider] Taking Active Spouse/Significant Other  losartan (COZAAR) 25 MG tablet [id number] Yes TAKE 1/2 TABLET(12.5 MG) BY MOUTH DAILY Jonelle Sidle, MD Taking Active   memantine [facility]) 10 MG tablet [id number] Yes Take 1 tablet (10 mg total) by mouth 2 (two) times daily. Ihor Austin, NP Taking Active   metFORMIN (GLUCOPHAGE) 500 MG tablet [id number] Yes Take 500 mg by mouth 2 (two) times daily with a meal. [provider] Taking Active Spouse/Significant Other  metoprolol succinate (TOPROL-XL) 25 MG 24 hr tablet [id number] Yes Take 0.5 tablets (12.5 mg total) by mouth daily. Ellsworth Lennox, PA-C Taking Active   mirabegron ER (MYRBETRIQ) 25 MG TB24 tablet [id number] Yes Take 25 mg by mouth daily. [provider] Taking Active   Multiple Vitamin (MULTIVITAMIN WITH MINERALS) TABS tablet [id number] Yes Take 1 tablet by mouth daily. [provider] Taking Active Spouse/Significant Other  ONETOUCH VERIO test strip [id number] Yes 1 each by Other route in the morning, at noon, and at bedtime.  [provider] Taking Active Spouse/Significant Other  rosuvastatin (CRESTOR) 20 MG tablet [id number] Yes TAKE 1 TABLET(20 MG) BY MOUTH DAILY Jonelle Sidle, MD Taking Active   sertraline (ZOLOFT) 25 MG tablet [id number] Yes Take 1 tablet (25 mg total) by mouth daily. Ihor Austin, NP Taking Active   tamsulosin (FLOMAX) 0.4 MG CAPS capsule [id number] Yes Take 0.4 mg by mouth daily. [provider] Taking Active   TOUJEO SOLOSTAR 300 UNIT/ML Solostar Pen [id number] Yes Inject 15 Units into the skin daily. Gwenlyn Perking,  Mikle Bosworth, MD Taking Active             Assessment:  Drugs sorted by  system:  Neurologic/Psychologic:  Cardiovascular:  Pulmonary/Allergy:  Gastrointestinal:  Endocrine:  Renal:  Topical:  Pain:  Infectious Diseases:  Oncology:  Genitourinary:  Vitamins/Minerals/Supplements:  Miscellaneous:  Medication Review Findings:  *** *** ***  Medication Assistance Findings:  {Medication assistance THN:22160}     Additional medication assistance options reviewed with patient as warranted:  {Additional mediction assistance options:21932}  Plan: I will route patient assistance letter to [facility] pharmacy technician who will coordinate patient assistance program application process for medications listed above.  [facility] pharmacy technician will assist with obtaining all required documents from both patient and provider(s) and submit application(s) once completed.

## 2022-11-27 ENCOUNTER — Telehealth: Payer: Self-pay | Admitting: Pharmacy Technician

## 2022-11-27 DIAGNOSIS — Z5986 Financial insecurity: Secondary | ICD-10-CM

## 2022-11-27 NOTE — Progress Notes (Signed)
[facility] Customer service manager [facility])                                            [facility] Quality Pharmacy Team    11/27/2022  Patrick Miranda [birth year] [id number]                                      Medication Assistance Referral  Referral From: [facility] RPh Katina B.  Medication/Company: Patrick Miranda / [employer] Patient application portion:  Mining engineer portion: Faxed  to Dr. Geoffry Paradise [facility] CPP) Provider address/fax verified via: Office website  Medication/Company: Patrick Miranda / Novo Nordisk Patient application portion:  Mailed Provider application portion: Faxed  to Dr. Geoffry Paradise [facility] CPP) Provider address/fax verified via: Office website  Medication/Company: Patrick Miranda / BI Patient application portion:  Mailed Provider application portion: Faxed  to Dr. Geoffry Paradise [facility] CPP) Provider address/fax verified via: Office website  Medication/Company: Patrick Miranda / BMS Patient application portion:  Mailed Provider application portion: Faxed  to Dr. Geoffry Paradise Provider address/fax verified via: Office website  Patrick Miranda, CPhT [facility] [employer]  [phone]

## 2022-12-10 ENCOUNTER — Ambulatory Visit: Payer: Medicare HMO | Admitting: Podiatry

## 2022-12-10 ENCOUNTER — Encounter: Payer: Self-pay | Admitting: Podiatry

## 2022-12-10 DIAGNOSIS — E119 Type 2 diabetes mellitus without complications: Secondary | ICD-10-CM

## 2022-12-10 DIAGNOSIS — B351 Tinea unguium: Secondary | ICD-10-CM

## 2022-12-10 DIAGNOSIS — M79676 Pain in unspecified toe(s): Secondary | ICD-10-CM

## 2022-12-10 NOTE — Progress Notes (Signed)
This patient returns to my office for at risk foot care.  This patient requires this care by a professional since this patient will be at risk due to having diabetes and CKD.and coagulation defect..  Patient is taking plavix and eliquis causing coagulation defect. This patient is unable to cut nails himself since the patient cannot reach his nails.These nails are painful walking and wearing shoes.  This patient presents for at risk foot care today.  General Appearance  Alert, conversant and in no acute stress.  Vascular  Dorsalis pedis and posterior tibial  pulses are palpable  bilaterally.  Capillary return is within normal limits  bilaterally. Temperature is within normal limits  bilaterally.  Neurologic  Senn-Weinstein monofilament wire test within normal limits  bilaterally. Muscle power within normal limits bilaterally.  Nails Thick disfigured discolored nails with subungual debris  hallux nails  bilaterally. No evidence of bacterial infection or drainage bilaterally.  Asymptomatic left hallux nail.  Orthopedic  No limitations of motion  feet .  No crepitus or effusions noted.  HAV  B/L.  Overlapping second toe left foot.  Skin  normotropic skin with no porokeratosis noted bilaterally.  No signs of infections or ulcers noted.   Asymptomatic pinch callus.  Onychomycosis  Pain in right toes  Pain in left toes  Consent was obtained for treatment procedures.   Mechanical debridement of nails 1-5  bilaterally performed with a nail nipper.  Filed with dremel without incident.    Return office visit 3  months                  Told patient to return for periodic foot care and evaluation due to potential at risk complications.   Helane Gunther DPM

## 2022-12-11 DIAGNOSIS — K8020 Calculus of gallbladder without cholecystitis without obstruction: Secondary | ICD-10-CM | POA: Diagnosis not present

## 2022-12-11 DIAGNOSIS — R140 Abdominal distension (gaseous): Secondary | ICD-10-CM | POA: Diagnosis not present

## 2022-12-11 DIAGNOSIS — N281 Cyst of kidney, acquired: Secondary | ICD-10-CM | POA: Diagnosis not present

## 2022-12-11 DIAGNOSIS — K529 Noninfective gastroenteritis and colitis, unspecified: Secondary | ICD-10-CM | POA: Diagnosis not present

## 2022-12-30 ENCOUNTER — Ambulatory Visit: Payer: Medicare HMO | Admitting: Cardiology

## 2023-01-19 ENCOUNTER — Other Ambulatory Visit: Payer: Self-pay | Admitting: Anesthesiology

## 2023-01-19 MED ORDER — SERTRALINE HCL 25 MG PO TABS: 25.0000 mg | ORAL_TABLET | Freq: Every day | ORAL | 3 refills | Status: DC

## 2023-01-29 DIAGNOSIS — K529 Noninfective gastroenteritis and colitis, unspecified: Secondary | ICD-10-CM | POA: Diagnosis not present

## 2023-02-17 DIAGNOSIS — N1831 Chronic kidney disease, stage 3a: Secondary | ICD-10-CM | POA: Diagnosis not present

## 2023-02-17 DIAGNOSIS — Z794 Long term (current) use of insulin: Secondary | ICD-10-CM | POA: Diagnosis not present

## 2023-02-17 DIAGNOSIS — E785 Hyperlipidemia, unspecified: Secondary | ICD-10-CM | POA: Diagnosis not present

## 2023-02-17 DIAGNOSIS — E1129 Type 2 diabetes mellitus with other diabetic kidney complication: Secondary | ICD-10-CM | POA: Diagnosis not present

## 2023-02-17 DIAGNOSIS — I129 Hypertensive chronic kidney disease with stage 1 through stage 4 chronic kidney disease, or unspecified chronic kidney disease: Secondary | ICD-10-CM | POA: Diagnosis not present

## 2023-02-19 ENCOUNTER — Telehealth: Payer: Self-pay | Admitting: Pharmacy Technician

## 2023-02-19 DIAGNOSIS — Z5986 Financial insecurity: Secondary | ICD-10-CM

## 2023-02-19 NOTE — Progress Notes (Signed)
[facility])                                            [facility] Quality Pharmacy Team    02/19/2023  Patrick Miranda [birth year] [id number]  Received both patient and provider portion(s) of patient assistance application(s) for [place] and Novolog. Faxed completed application and required documents into [employer].  NOTE: :Based on income documents submitted, patient does not qualify for BMS patient assistance foundation for Eliquis, BI patient assistance foundation for [name] and [place] patient assistance as patient is over income for these programs.  Pattricia Boss, CPhT [place]  [facility] Office: [phone] Fax: [phone] Email: [email or web address]

## 2023-02-24 ENCOUNTER — Telehealth: Payer: Self-pay | Admitting: Pharmacy Technician

## 2023-02-24 DIAGNOSIS — Z5986 Financial insecurity: Secondary | ICD-10-CM

## 2023-02-24 NOTE — Progress Notes (Signed)
[facility])                                            [facility] Quality Pharmacy Team    02/24/2023  Patrick Miranda August 25, 1945 [id number]  Care coordination call placed to [employer] in regard to [place] and Novolog application.   Spoke to [place] who informs patient is APPROVED 02/23/23-07/21/23 as patient has Medicare Part D. Initial shipment as well as refills will automatically fill and ship to prescriber's address on file. Patient may call Novo Nordisk at [phone] if shipment has not arrived and patient does not have sufficient supply.  Pattricia Boss, CPhT [place]  [facility] Office: [phone] Fax: [phone] Email: [email or web address]

## 2023-03-04 ENCOUNTER — Ambulatory Visit: Payer: Medicare HMO | Attending: Cardiology | Admitting: Cardiology

## 2023-03-04 ENCOUNTER — Encounter: Payer: Self-pay | Admitting: Cardiology

## 2023-03-04 VITALS — BP 132/60 | HR 64 | Ht 70.5 in | Wt 169.0 lb

## 2023-03-04 DIAGNOSIS — I5022 Chronic systolic (congestive) heart failure: Secondary | ICD-10-CM

## 2023-03-04 DIAGNOSIS — I25119 Atherosclerotic heart disease of native coronary artery with unspecified angina pectoris: Secondary | ICD-10-CM

## 2023-03-04 DIAGNOSIS — I255 Ischemic cardiomyopathy: Secondary | ICD-10-CM

## 2023-03-04 DIAGNOSIS — I10 Essential (primary) hypertension: Secondary | ICD-10-CM | POA: Diagnosis not present

## 2023-03-04 DIAGNOSIS — I480 Paroxysmal atrial fibrillation: Secondary | ICD-10-CM

## 2023-03-04 NOTE — Progress Notes (Signed)
Cardiology Office Note  Date: 03/04/2023   ID: QUAME VUKELICH, DOB 25-Feb-1946, MRN [id number]  History of Present Illness: Patrick Miranda is a 77 y.o. male last seen in November 2023.  He is here today with his wife for a follow-up visit.  He does not report any angina with current level of activity, stable NYHA class II dyspnea, no progressive palpitations.  No leg swelling, orthopnea, or PND.  It is down in comparison to April.  I reviewed his medications.  Cardiac regimen now includes Eliquis for stroke prophylaxis and cardiomyopathy regimen including [name] which she is tolerating, Cozaar, and Toprol-XL.  Echocardiogram from December 2023 revealed LVEF 45 to 50%.  I reviewed his lab work from earlier this year.  He continues to follow regularly with Dr. Jacky Kindle.  Physical Exam: VS:  BP 132/60   Pulse 64   Ht 5' 10.5" (1.791 m)   Wt 169 lb (76.7 kg)   SpO2 95%   BMI 23.91 kg/m , BMI Body mass index is 23.91 kg/m.  Wt Readings from Last 3 Encounters:  03/04/23 169 lb (76.7 kg)  11/10/22 182 lb 3.2 oz (82.6 kg)  09/28/22 180 lb 12.4 oz (82 kg)    General: Patient appears comfortable at rest. HEENT: Conjunctiva and lids normal. Neck: Supple, no elevated JVP or carotid bruits. Lungs: Clear to auscultation, nonlabored breathing at rest. Cardiac: Regular rate and rhythm, no S3 or significant systolic murmur. Extremities: No pitting edema.  ECG:  An ECG dated 09/28/2022 was personally reviewed today and demonstrated:  Sinus rhythm with IVCD and repolarization abnormalities.  Labwork: 09/28/2022: ALT 12; AST 27; BUN 26; Creatinine, Ser 1.58; Hemoglobin 11.8; Platelets 236; Potassium 3.7; Sodium 136   Other Studies Reviewed Today:  Echocardiogram 06/23/2022:  1. 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 . Left  ventricular ejection fraction, by estimation, is 45 to 50%. The left  ventricle has mildly decreased function. The left ventricle demonstrates   regional wall motion abnormalities (see scoring  diagram/findings for description). There is mild left ventricular  hypertrophy. Left ventricular diastolic parameters are consistent with  Grade I diastolic dysfunction (impaired relaxation).   2. Right ventricular systolic function is normal. The right ventricular  size is normal. There is normal pulmonary artery systolic pressure.   3. Left atrial size was moderately dilated.   4. Right atrial size was mildly dilated.   5. The mitral valve is abnormal. Mild mitral valve regurgitation. No  evidence of mitral stenosis.   6. The aortic valve has an indeterminant number of cusps. There is mild  calcification of the aortic valve. There is mild thickening of the aortic  valve. Aortic valve regurgitation is mild.   7. The inferior vena cava is normal in size with greater than 50%  respiratory variability, suggesting right atrial pressure of 3 mmHg.   Assessment and Plan:  1.  HFmrEF with ischemic cardiomyopathy, LVEF approximately 45 to 50% by echocardiogram in December 2023.  Clinically stable with no fluid retention on examination.  He is currently on [name], losartan, and Toprol-XL.  No additional diuretic at this time.   2.  Paroxysmal atrial fibrillation with CHA2DS2-VASc score of 7.  He remains on Eliquis for stroke prophylaxis.  No interval palpitations and heart rate is regular today.  Continue current regimen.   3.  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  He does not report any angina with current level of activity.  Not on aspirin given use  of Eliquis.  Continue Crestor.  4.  Essential hypertension.  Systolic in the 130s today.  No changes made to current regimen.  Disposition:  Follow up  6 months.  Signed, Jonelle Sidle, M.D., F.A.C.C. [facility]

## 2023-03-04 NOTE — Patient Instructions (Addendum)

## 2023-03-18 ENCOUNTER — Encounter: Payer: Self-pay | Admitting: Podiatry

## 2023-03-18 ENCOUNTER — Ambulatory Visit: Payer: Medicare HMO | Admitting: Podiatry

## 2023-03-18 DIAGNOSIS — M79676 Pain in unspecified toe(s): Secondary | ICD-10-CM | POA: Diagnosis not present

## 2023-03-18 DIAGNOSIS — D689 Coagulation defect, unspecified: Secondary | ICD-10-CM | POA: Diagnosis not present

## 2023-03-18 DIAGNOSIS — B351 Tinea unguium: Secondary | ICD-10-CM

## 2023-03-18 DIAGNOSIS — E119 Type 2 diabetes mellitus without complications: Secondary | ICD-10-CM | POA: Diagnosis not present

## 2023-03-18 NOTE — Progress Notes (Signed)
This patient returns to my office for at risk foot care.  This patient requires this care by a professional since this patient will be at risk due to having diabetes and CKD.and coagulation defect..  Patient is taking plavix and eliquis causing coagulation defect. This patient is unable to cut nails himself since the patient cannot reach his nails.These nails are painful walking and wearing shoes.  This patient presents for at risk foot care today.  General Appearance  Alert, conversant and in no acute stress.  Vascular  Dorsalis pedis and posterior tibial  pulses are palpable  bilaterally.  Capillary return is within normal limits  bilaterally. Temperature is within normal limits  bilaterally.  Neurologic  Senn-Weinstein monofilament wire test within normal limits  bilaterally. Muscle power within normal limits bilaterally.  Nails Thick disfigured discolored nails with subungual debris  hallux nails  bilaterally. No evidence of bacterial infection or drainage bilaterally.  Asymptomatic left hallux nail.  Orthopedic  No limitations of motion  feet .  No crepitus or effusions noted.  HAV  B/L.  Overlapping second toe left foot.  Skin  normotropic skin with no porokeratosis noted bilaterally.  No signs of infections or ulcers noted.   Asymptomatic pinch callus.  Onychomycosis  Pain in right toes  Pain in left toes  Consent was obtained for treatment procedures.   Mechanical debridement of nails 1-5  bilaterally performed with a nail nipper.  Filed with dremel without incident. Remove dead skin left hallux from healed blister.   Return office visit 3  months                  Told patient to return for periodic foot care and evaluation due to potential at risk complications.   Helane Gunther DPM

## 2023-03-24 ENCOUNTER — Ambulatory Visit: Payer: Medicare HMO | Admitting: Cardiology

## 2023-04-02 DIAGNOSIS — I4891 Unspecified atrial fibrillation: Secondary | ICD-10-CM | POA: Diagnosis not present

## 2023-04-02 DIAGNOSIS — R2689 Other abnormalities of gait and mobility: Secondary | ICD-10-CM | POA: Diagnosis not present

## 2023-04-02 DIAGNOSIS — S41112A Laceration without foreign body of left upper arm, initial encounter: Secondary | ICD-10-CM | POA: Diagnosis not present

## 2023-04-02 DIAGNOSIS — W010XXA Fall on same level from slipping, tripping and stumbling without subsequent striking against object, initial encounter: Secondary | ICD-10-CM | POA: Diagnosis not present

## 2023-04-02 DIAGNOSIS — H6123 Impacted cerumen, bilateral: Secondary | ICD-10-CM | POA: Diagnosis not present

## 2023-04-07 ENCOUNTER — Other Ambulatory Visit: Payer: Self-pay | Admitting: Cardiology

## 2023-04-07 DIAGNOSIS — L570 Actinic keratosis: Secondary | ICD-10-CM | POA: Diagnosis not present

## 2023-04-07 DIAGNOSIS — L304 Erythema intertrigo: Secondary | ICD-10-CM | POA: Diagnosis not present

## 2023-04-07 DIAGNOSIS — H9193 Unspecified hearing loss, bilateral: Secondary | ICD-10-CM | POA: Diagnosis not present

## 2023-04-07 DIAGNOSIS — Z23 Encounter for immunization: Secondary | ICD-10-CM | POA: Diagnosis not present

## 2023-04-07 DIAGNOSIS — L821 Other seborrheic keratosis: Secondary | ICD-10-CM | POA: Diagnosis not present

## 2023-04-07 DIAGNOSIS — H6123 Impacted cerumen, bilateral: Secondary | ICD-10-CM | POA: Diagnosis not present

## 2023-04-13 NOTE — Progress Notes (Unsigned)
[facility] [address] [place]. [place] [address] [phone]       OFFICE FOLLOW UP VISIT NOTE  Mr. Hemanth Mascioli [place] Date of Birth:  03-31-1946 Medical Record Number:  [id number]   Primary neurologist: Dr. Pearlean Brownie Referring MD: Dr. Roda Shutters Reason for Referral: Dementia   No chief complaint on file.     HPI:   Initial visit 12/06/2020 Dr. Karle Barr. [place] is a 86 Caucasian male seen today for initial office consultation visit following hospital admission for confusion.  History is obtained from the patient and wife and review of electronic medical records and I personally reviewed available pertinent imaging films in PACS.  He has past medical history of right pontine stroke in 2013, coronary artery disease s/p CABG December 21, hypertension, hyperlipidemia, cardiomyopathy, left ventricular thrombus and pulmonary emboli.  Patient was admitted to [facility] in February 2022 for altered mental status for couple of weeks.  He had some odd behavior and was confused on multiple occasions.  CT scan of the head was showed suspected subacute left frontal and old right parietal infarcts.  MRI scan showed some T2 shine through but no acute infarct and dislocations.  2D echo showed ejection fraction of 40% with a kinesis of the inferior and inferior septal base with aneurysmal dilatation.  This was improved compared with previous echo from January 2022 when there was apical thrombus which was no longer seen.  EEG showed mild diffuse slowing suggestive of encephalopathy but no seizures.  LDL cholesterol 25 mg percent.  Hemoglobin A1c was 6.8.  Patient had been on Coumadin which she had a tough time taking and was switched to Eliquis which she is not tolerating much better without bruising or bleeding.  Continues to have memory loss and cognitive impairment which is unchanged.  Patient not been evaluated for reversible causes of cognitive impairment and not been tried on  medication like Aricept or Namenda yet.  Patient had a remote history of a right pontine stroke in 2013 and had only mild speech difficulties which resolved completely.  He had no other strokes due to his recent cardiac surgery in December.  He had transient postop A. fib as well and a left ventricular thrombus on the echo which has since resolved.  There is no family history of Alzheimer's.  Patient has no history of seizures or significant head injury with loss of consciousness.  Patient's cognitive impairment is mostly related to short-term memory and remembering recent information.  He still mostly independent in activities of daily living and can attend to his own bodily needs.  He does get frustrated easily but there is no agitation, violent behavior, delusions or hallucinations.  He has not exhibited any unsafe behavior and does not wander off.  His balance is poor and he does use a cane and he fell once in the bathroom a month ago does not fall on a regular basis.  Update 03/13/2021 Dr. Pearlean Brownie; He returns for follow-up after last visit 3 months ago.  He is accompanied by his wife.   They feel his memory is slightly worse.  He does get confused off and on at times.  He is still mostly independent with activities of daily living but he needs constant reminders and repeated reminders about recent staffing which he forgets.  He has not been driving.  Patient has been complaining of getting tired easily and hence does not walk a lot.  Wife feels at times when he is tired he  can drag his feet.  He has had no falls or injuries.  There have been no delusions or hallucinations noted.  He does get occasionally agitated but there has been no violent behavior.  He continues to live with his wife and daughter and have somebody around him all the time.  He did undergo lab work on 12/06/2020 which was significant for low vitamin B12 levels of 153 otherwise TSH, homocystine and RPR were normal.  EEG on 12/31/2020 was normal.   Patient had a fall and was seen in the ER on 02/17/2021 and had a CT scan of the head which showed mild generalized atrophy and bilateral lower cortical infarcts with no acute abnormality.  On Mini-Mental status exam today he actually scored 27 out of 30 which is an improvement from last visit when he had scored 22/30.  Update 09/23/2021 Dr. Pearlean Brownie: He returns for follow-up after last visit 6 months ago.  He is accompanied by his wife and daughter.  They feel he is doing about the same.  His memory and cognitive difficulties are unchanged.  He remains on Namenda 10 mg twice daily which is tolerating well without side effects.  Patient seems to sleep a lot.  Does not walk a lot for exercise.  He has not been regularly doing cognitively challenging activities.  He has not had any new health issues.  Remains on Eliquis which is tolerating well without bleeding or bruising.  Blood pressures well controlled today it is 119/74.  Sugars are also under good control.  He is tolerating Crestor well without muscle aches and pains.  He has no new complaints today.  On Mini-Mental status exam today scored 25/30 which is similar to 27/30 at last visit 6 months ago.  Family does not feel he has had any significant cognitive decline  Update 03/27/2022 JM: Patient returns for 19-month memory follow-up accompanied by his wife and daughter.   Patient believes memory has been stable but family believes memory has gradually declined since prior visit. Will misplace items, will forget where their home is, short term memory worsening, difficulty operating remote or cell phone at times. Sleeps throughout the night, can have occasional nocturia, frequent daytime napping, will get up to eat or go to the bathroom. He doesn't do any type of physical or mental activity, wife tries to encourage him but he refuses. Does use his CPAP nightly. Appetite good.  Wife questions possible underlying depression that could be contributing.  Is scheduled next  month for yearly physical with PCP.  Blood pressure not routinely monitored at home.  Glucose levels have been slightly elevated, past 7-day average 210, wife believes this is because he will forget that he just ate and then eat again.  No further concerns at this time  Update 09/25/2022 JM: Returns for 80-month follow-up. Patient believes his memory has been stable since prior visit. Per wife, believes memory has declined some since prior visit more so with short term memory. Reports he "sleeps like a log", sleeps all the time per wife, day and night.  Remains sedentary, not interested in participating in any type of activity centers or local gyms.  Does not do any mentally stimulating activities.  Appetite is good. No behavioral concerns. At prior visit, wife mentioned concerns of possible depression, has noticed some improvement of anger/outbursts but believes he is more sleepy since starting. Did have lab work last fall with PCP, unable to view via epic, per wife, iron levels were low and currently  on supplement and also started on thyroid medication. Is currently taking B12 for known B12 deficiency. Has not had repeat levels since.    Update 04/14/2023 JM: Patient returns for follow-up visit.           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 s/p CABG in 06/2020 with LIMA-LAD, SVG-D1 and SVG-RCA   COVID-07 Dec 2019   Dementia [facility])    Depression    Essential hypertension    Hernia, umbilical    HOH (hard of hearing)    Hyperlipidemia    Ischemic cardiomyopathy    Ischemic necrosis of small bowel (HCC)    Left ventricular mural thrombus    NSTEMI (non-ST elevated myocardial infarction) (HCC)    OSA on CPAP    Pulmonary emboli [facility])    May 2021   Skin cancer     Type 2 diabetes mellitus (HCC)     Social History:  Social History   Socioeconomic History   Marital status: Married    Spouse name: [name]   Number of children: Not on file   Years of education: Not on file   Highest education level: Not on file  Occupational History   Not on file  Tobacco Use   Smoking status: Never   Smokeless tobacco: Never  Vaping Use   Vaping status: Never Used  Substance and Sexual Activity   Alcohol use: No   Drug use: No   Sexual activity: Not on file  Other Topics Concern   Not on file  Social History Narrative   Lives with wife at Daughter's home   Right Handed   Drinks very little caffeine daily   Social Determinants of Health   Financial Resource Strain: Not on file  Food Insecurity: No Food Insecurity (06/21/2021)   Hunger Vital Sign    Worried About Running Out of Food in the Last Year: Never true    Ran Out of Food in the Last Year: Never true  Transportation Needs: No Transportation Needs (10/24/2021)   PRAPARE - Administrator, Civil Service (Medical): No    Lack of Transportation (Non-Medical): No  Physical Activity: Not on file  Stress: Not on file  Social Connections: Unknown (11/29/2021)   Received from [facility], [facility]   Social Network    Social Network: Not on file  Intimate Partner Violence: Unknown (10/21/2021)   Received from [facility], [facility]   HITS    Physically Hurt: Not on file    Insult or Talk Down To: Not on file    Threaten Physical Harm: Not on file    Scream or Curse: Not on file    Medications:   Current Outpatient Medications on File Prior to Visit  Medication Sig Dispense Refill   apixaban (ELIQUIS) 5 MG TABS tablet TAKE 1 TABLET(5 MG) BY MOUTH TWICE DAILY 180 tablet 1   BD PEN NEEDLE NANO U/F 32G X 4 MM MISC 1 each by Other route daily.      Biotin 5000 MCG TABS Take 5,000 mcg by mouth daily.     budesonide (ENTOCORT EC) 3 MG 24 hr capsule Take 3 mg by mouth daily.  Continuous Blood Gluc Sensor (FREESTYLE LIBRE 2 SENSOR) MISC See admin instructions.     Cyanocobalamin (B-12) 5000 MCG SUBL Place 5,000 mcg under the tongue daily.     fenofibrate 160 MG tablet 1 capsule with a meal Orally Once a day     Insulin Aspart (NOVOLOG FLEXPEN [place]) Inject 0-15 Units into the skin 3 (three) times daily before meals. Dose per sliding scale     [name] 10 MG TABS tablet TAKE 1 TABLET(10 MG) BY MOUTH DAILY BEFORE BREAKFAST 90 tablet 3   levothyroxine (SYNTHROID) 50 MCG tablet Take 50 mcg by mouth every morning.     losartan (COZAAR) 25 MG tablet TAKE 1/2 TABLET(12.5 MG) BY MOUTH DAILY 45 tablet 1   memantine (NAMENDA) 10 MG tablet Take 1 tablet (10 mg total) by mouth 2 (two) times daily. 180 tablet 3   metFORMIN (GLUCOPHAGE) 500 MG tablet Take 500 mg by mouth 2 (two) times daily with a meal.     metoprolol succinate (TOPROL-XL) 25 MG 24 hr tablet Take 0.5 tablets (12.5 mg total) by mouth daily. 45 tablet 3   Multiple Vitamin (MULTIVITAMIN WITH MINERALS) TABS tablet Take 1 tablet by mouth daily.     ONETOUCH VERIO test strip 1 each by Other route in the morning, at noon, and at bedtime.   3   rosuvastatin (CRESTOR) 20 MG tablet TAKE 1 TABLET(20 MG) BY MOUTH DAILY 90 tablet 3   sertraline (ZOLOFT) 25 MG tablet Take 1 tablet (25 mg total) by mouth daily. 30 tablet 3   tamsulosin (FLOMAX) 0.4 MG CAPS capsule Take 0.4 mg by mouth daily.     TOUJEO SOLOSTAR 300 UNIT/ML Solostar Pen Inject 15 Units into the skin daily.     No current facility-administered medications on file prior to visit.    Allergies:   Allergies  Allergen Reactions   Lipitor [Atorvastatin] Other (See Comments)    Muscle pain   Sitagliptin     Myalgias Other reaction(s): myalgias    Physical Exam There were no vitals filed for this visit.   There is no height or weight on file to calculate BMI.   General: well developed, well nourished elderly Caucasian male, seated, in no evident  distress Head: head normocephalic and atraumatic.   Neck: supple with no carotid or supraclavicular bruits Cardiovascular: regular rate and rhythm, no murmurs Musculoskeletal: no deformity Skin:  no rash/petichiae Vascular:  Normal pulses all extremities  Neurologic Exam Mental Status: Awake and fully alert. Oriented to place and time. Recent and remote memory diminished. Attention span, concentration and fund of knowledge poor mood and affect appropriate. Cranial Nerves: Pupils equal, briskly reactive to light. Extraocular movements full without nystagmus. Visual fields full to confrontation. Hearing significantly diminished bilaterally. facial sensation intact. Face, tongue, palate moves normally and symmetrically.  Motor: Normal bulk and tone. Normal strength in all tested extremity muscles except mild weakness of bilateral grip and hip flexors.. Sensory.: intact to touch , pinprick , position and vibratory sensation.  Coordination: Rapid alternating movements normal in all extremities. Finger-to-nose and heel-to-shin performed accurately bilaterally. Gait and Station: Arises from chair with slight difficulty. Stance is stooped l. Gait demonstrates normal stride length and only slight imbalance with use of walking stick.  Not able to heel, toe and tandem walk    Reflexes: 1+ and symmetric. Toes downgoing.       09/25/2022    2:26 PM  Montreal Cognitive Assessment   Visuospatial/ Executive (0/5) 4  Naming (0/3) 2  Attention: Read list of digits (0/2) 1  Attention: Read list of letters (0/1) 0  Attention: Serial 7 subtraction starting at 100 (0/3) 1  Language: Repeat phrase (0/2) 2  Language : Fluency (0/1) 0  Abstraction (0/2) 2  Delayed Recall (0/5) 1  Orientation (0/6) 6  Total 19      03/27/2022    3:09 PM 09/23/2021    2:31 PM 03/13/2021    3:08 PM  MMSE - Mini Mental State Exam  Orientation to time 4 4 3   Orientation to Place 5 5 4   Registration 2 3 3   Attention/  Calculation 1 2 5   Recall 2 2 3   Language- name 2 objects 2 2 2   Language- repeat 1 1 1   Language- follow 3 step command 3 3 3   Language- read & follow direction 1 1 1   Write a sentence 1 1 1   Copy design 1 1 1   Total score 23 25 27             ASSESSMENT/PLAN: 77 year old Caucasian male with cognitive deterioration and memory loss for the last  5 months following cardiac surgery likely due to mild vascular dementia.  History of remote right pontine infarct in 2013 and suspect left frontal and right parietal infarcts following cardiac surgery in December 2021.  Vascular risk factors of coronary artery disease, left ventricular thrombus, perioperative atrial fibrillation, diabetes, hypertension, hyperlipidemia and age    -continued gradual decline per wife -MOCA today ***/30 (prior 19/30) -MMSE 23/30 (03/2022) -Continue Namenda 10 mg twice daily - refill provided -wife concern sertraline causing daytime fatigue, currently on low dose, can hold medication over the next week to see if any improvement. If improved and no issues with anger/outbursts, can continue to stay off medication. If no improvement of fatigue and outbursts return, would recommend restarting. Suspect fatigue more coming from being sedentary and highly encouraged increasing daily activity, socialization and mentally stimulating activities but will also repeat lab work today as iron deficiency (dx'd 03/2022), hypothyroidism (dx'd 03/2022), B12 deficiency and vitamin D deficiency could also be contributing to fatigue     Follow-up in 6 months or call earlier if needed    CC:  Geoffry Paradise, MD   I spent 31 minutes of face-to-face and non-face-to-face time with patient and wife.  This included previsit chart review, lab review, study review, order entry, electronic health record documentation, patient and wife education and discussion regarding above diagnoses and treatment plan and answered all other questions to  patient and wife satisfaction   Ihor Austin, AGNP-BC  [facility] [address] [place], [place] [address]  Phone [phone] Fax [phone] Note: This document was prepared with digital dictation and possible smart phrase technology. Any transcriptional errors that result from this process are unintentional.

## 2023-04-14 ENCOUNTER — Ambulatory Visit: Payer: Medicare HMO | Admitting: Adult Health

## 2023-04-14 ENCOUNTER — Encounter: Payer: Self-pay | Admitting: Adult Health

## 2023-04-14 VITALS — BP 133/62 | HR 62 | Ht 70.0 in | Wt 167.0 lb

## 2023-04-14 DIAGNOSIS — R413 Other amnesia: Secondary | ICD-10-CM | POA: Diagnosis not present

## 2023-04-14 DIAGNOSIS — F01A0 Vascular dementia, mild, without behavioral disturbance, psychotic disturbance, mood disturbance, and anxiety: Secondary | ICD-10-CM

## 2023-04-14 MED ORDER — DONEPEZIL HCL 10 MG PO TABS: 10.0000 mg | ORAL_TABLET | Freq: Every day | ORAL | 3 refills | Status: DC

## 2023-04-14 NOTE — Patient Instructions (Addendum)
Your Plan:  Continue Namenda 10mg  twice daily  Start Aricept 10mg  nightly to hopefully help further slow memory decline  You will be called to schedule a neurocognitive evaluation for further evaluation   Highly recommend increasing daily activity both physically and mentally as this can help slow your memory decline     Follow up in 6 months or call earlier if needed       Thank you for coming to see [place] at [facility]. I hope we have been able to provide you high quality care today.  You may receive a patient satisfaction survey over the next few weeks. We would appreciate your feedback and comments so that we may continue to improve ourselves and the health of our patients.

## 2023-04-16 ENCOUNTER — Telehealth: Payer: Self-pay | Admitting: Adult Health

## 2023-04-16 NOTE — Telephone Encounter (Signed)
Referral for neuropsychology fax to [facility]. Phone: [phone], Fax: [phone]. 

## 2023-04-17 NOTE — Telephone Encounter (Signed)
Pt's wife called [facility] do not accept pt's insurance. Advised pt to call insurance company to find out who is in network and we will be glad to send referral.

## 2023-04-19 ENCOUNTER — Emergency Department (HOSPITAL_COMMUNITY)
Admission: EM | Admit: 2023-04-19 | Discharge: 2023-04-19 | Disposition: A | Discharge status: Home / Self Care | Payer: Medicare HMO | Attending: Emergency Medicine | Admitting: Emergency Medicine

## 2023-04-19 ENCOUNTER — Emergency Department (HOSPITAL_COMMUNITY): Payer: Medicare HMO

## 2023-04-19 ENCOUNTER — Other Ambulatory Visit: Payer: Self-pay

## 2023-04-19 ENCOUNTER — Encounter (HOSPITAL_COMMUNITY): Payer: Self-pay | Admitting: Emergency Medicine

## 2023-04-19 DIAGNOSIS — Z794 Long term (current) use of insulin: Secondary | ICD-10-CM | POA: Insufficient documentation

## 2023-04-19 DIAGNOSIS — J9811 Atelectasis: Secondary | ICD-10-CM | POA: Diagnosis not present

## 2023-04-19 DIAGNOSIS — R0602 Shortness of breath: Secondary | ICD-10-CM | POA: Diagnosis not present

## 2023-04-19 DIAGNOSIS — R001 Bradycardia, unspecified: Secondary | ICD-10-CM | POA: Insufficient documentation

## 2023-04-19 DIAGNOSIS — Z7901 Long term (current) use of anticoagulants: Secondary | ICD-10-CM | POA: Insufficient documentation

## 2023-04-19 DIAGNOSIS — R5383 Other fatigue: Secondary | ICD-10-CM | POA: Insufficient documentation

## 2023-04-19 DIAGNOSIS — Z7984 Long term (current) use of oral hypoglycemic drugs: Secondary | ICD-10-CM | POA: Diagnosis not present

## 2023-04-19 DIAGNOSIS — E119 Type 2 diabetes mellitus without complications: Secondary | ICD-10-CM | POA: Diagnosis not present

## 2023-04-19 DIAGNOSIS — I517 Cardiomegaly: Secondary | ICD-10-CM | POA: Diagnosis not present

## 2023-04-19 LAB — BASIC METABOLIC PANEL
Anion gap: 7 (ref 5–15)
BUN: 20 mg/dL (ref 8–23)
CO2: 25 mmol/L (ref 22–32)
Calcium: 8.7 mg/dL — ABNORMAL LOW (ref 8.9–10.3)
Chloride: 105 mmol/L (ref 98–111)
Creatinine, Ser: 1.28 mg/dL — ABNORMAL HIGH (ref 0.61–1.24)
GFR, Estimated: 57 mL/min — ABNORMAL LOW (ref >60)
Glucose, Bld: 192 mg/dL — ABNORMAL HIGH (ref 70–99)
Potassium: 4.1 mmol/L (ref 3.5–5.1)
Sodium: 137 mmol/L (ref 135–145)

## 2023-04-19 LAB — AMMONIA: Ammonia: <10 umol/L (ref 9–35)

## 2023-04-19 LAB — URINALYSIS, ROUTINE W REFLEX MICROSCOPIC
Bacteria, UA: NONE SEEN
Bilirubin Urine: NEGATIVE
Glucose, UA: >=500 mg/dL — AB
Hgb urine dipstick: NEGATIVE
Ketones, ur: 5 mg/dL — AB
Leukocytes,Ua: NEGATIVE
Nitrite: NEGATIVE
Protein, ur: NEGATIVE mg/dL
Specific Gravity, Urine: 1.026 (ref 1.005–1.030)
pH: 5 (ref 5.0–8.0)

## 2023-04-19 LAB — CBC
HCT: 37.8 % — ABNORMAL LOW (ref 39.0–52.0)
Hemoglobin: 12.1 g/dL — ABNORMAL LOW (ref 13.0–17.0)
MCH: 30.2 pg (ref 26.0–34.0)
MCHC: 32 g/dL (ref 30.0–36.0)
MCV: 94.3 fL (ref 80.0–100.0)
Platelets: 321 10*3/uL (ref 150–400)
RBC: 4.01 MIL/uL — ABNORMAL LOW (ref 4.22–5.81)
RDW: 14.3 % (ref 11.5–15.5)
WBC: 9.4 10*3/uL (ref 4.0–10.5)
nRBC: 0 % (ref 0.0–0.2)

## 2023-04-19 LAB — LIPASE, BLOOD: Lipase: 45 U/L (ref 11–51)

## 2023-04-19 LAB — HEPATIC FUNCTION PANEL
ALT: 11 U/L (ref 0–44)
AST: 21 U/L (ref 15–41)
Albumin: 3.3 g/dL — ABNORMAL LOW (ref 3.5–5.0)
Alkaline Phosphatase: 43 U/L (ref 38–126)
Bilirubin, Direct: 0.1 mg/dL (ref 0.0–0.2)
Indirect Bilirubin: 0.5 mg/dL (ref 0.3–0.9)
Total Bilirubin: 0.6 mg/dL (ref 0.3–1.2)
Total Protein: 6.1 g/dL — ABNORMAL LOW (ref 6.5–8.1)

## 2023-04-19 LAB — CBG MONITORING, ED: Glucose-Capillary: 180 mg/dL — ABNORMAL HIGH (ref 70–99)

## 2023-04-19 MED ORDER — SODIUM CHLORIDE 0.9 % IV SOLN: INTRAVENOUS | Status: DC

## 2023-04-19 NOTE — Discharge Instructions (Signed)
Make an appointment to follow-up with cardiology either here or in the office.  Return for any new or worse symptoms.  Today's heart rate was all little bit on the slow side but was a normal slow rhythm.  If he remains persistently in the 40s which she currently is not you could hold his Toprol XL.  Workup here today without any acute findings.

## 2023-04-19 NOTE — ED Triage Notes (Signed)
Pt c/o shob since this am. Hx of dementia and with his daughter. Lives with her. She states he is lethargic and pulse was low. Pt denies pain. Pt is a/o x 4 but some obvious confusion noted. No resp distress or shob noted in triage. Color wnl.

## 2023-04-19 NOTE — ED Provider Notes (Signed)
[place] EMERGENCY DEPARTMENT AT [facility] Provider Note   CSN: [id number] Arrival date & time: 04/19/23  1406     History  Chief Complaint  Patient presents with   Shortness of Breath   Weakness    Patrick Miranda is a 77 y.o. male.  Patient is followed by Dr. Diona Browner cardiology.  Patient brought in by his daughter.  Concerned that he just seemed to be a bit out of it today maybe a little lethargic.  No complaints no nausea vomiting or diarrhea no respiratory symptoms no pain.  Patient was fine yesterday.  Temp 97.8 pulse 51 respirations 18 blood pressure 135/60 and oxygen saturation on room air was 99%.  Patient is on Eliquis and is on Toprol XL.  Past medical history is significant for hyperlipidemia brainstem stroke syndrome in 2013 type 2 diabetes history of pulmonary embolism in 2021 non-STEMI in the past history of ischemic cardiomyopathy.       Home Medications Prior to Admission medications   Medication Sig Start Date End Date Taking? Authorizing Provider  apixaban (ELIQUIS) 5 MG TABS tablet TAKE 1 TABLET(5 MG) BY MOUTH TWICE DAILY 02/25/22   Jonelle Sidle, MD  BD PEN NEEDLE NANO U/F 32G X 4 MM MISC 1 each by Other route daily.  03/19/18   [provider]  Biotin 5000 MCG TABS Take 5,000 mcg by mouth daily.    [provider]  budesonide (ENTOCORT EC) 3 MG 24 hr capsule Take 3 mg by mouth daily. 05/23/22 05/21/23  [provider]  Continuous Blood Gluc Sensor (FREESTYLE LIBRE 2 SENSOR) MISC See admin instructions. 10/31/20   [provider]  Cyanocobalamin (B-12) 5000 MCG SUBL Place 5,000 mcg under the tongue daily.    [provider]  donepezil (ARICEPT) 10 MG tablet Take 1 tablet (10 mg total) by mouth at bedtime. 04/14/23   Ihor Austin, NP  fenofibrate 160 MG tablet 1 capsule with a meal Orally Once a day 07/15/17   [provider]  Insulin Aspart (NOVOLOG FLEXPEN [place]) Inject 0-15 Units into the  skin 3 (three) times daily before meals. Dose per sliding scale    [provider]  [name] 10 MG TABS tablet TAKE 1 TABLET(10 MG) BY MOUTH DAILY BEFORE BREAKFAST 04/07/23   Jonelle Sidle, MD  levothyroxine (SYNTHROID) 50 MCG tablet Take 50 mcg by mouth every morning.    [provider]  losartan (COZAAR) 25 MG tablet TAKE 1/2 TABLET(12.5 MG) BY MOUTH DAILY 12/18/21   Jonelle Sidle, MD  memantine (NAMENDA) 10 MG tablet Take 1 tablet (10 mg total) by mouth 2 (two) times daily. 09/25/22   Ihor Austin, NP  metFORMIN (GLUCOPHAGE) 500 MG tablet Take 500 mg by mouth 2 (two) times daily with a meal. 10/10/20   [provider]  metoprolol succinate (TOPROL-XL) 25 MG 24 hr tablet Take 0.5 tablets (12.5 mg total) by mouth daily. 09/13/21   Strader, Lennart Pall, PA-C  Multiple Vitamin (MULTIVITAMIN WITH MINERALS) TABS tablet Take 1 tablet by mouth daily.    [provider]  [facility] VERIO test strip 1 each by Other route in the morning, at noon, and at bedtime.  03/20/18   [provider]  rosuvastatin (CRESTOR) 20 MG tablet TAKE 1 TABLET(20 MG) BY MOUTH DAILY 09/04/22   Jonelle Sidle, MD  sertraline (ZOLOFT) 25 MG tablet Take 1 tablet (25 mg total) by mouth daily. 01/19/23   Ihor Austin, NP  tamsulosin [facility])  0.4 MG CAPS capsule Take 0.4 mg by mouth daily.    [provider]  TOUJEO SOLOSTAR 300 UNIT/ML Solostar Pen Inject 15 Units into the skin daily. 05/21/21   Vassie Loll, MD      Allergies    Lipitor [atorvastatin] and Sitagliptin    Review of Systems   Review of Systems  Constitutional:  Positive for fatigue. Negative for chills and fever.  HENT:  Negative for ear pain and sore throat.   Eyes:  Negative for pain and visual disturbance.  Respiratory:  Negative for cough and shortness of breath.   Cardiovascular:  Negative for chest pain and palpitations.  Gastrointestinal:  Negative for abdominal pain and vomiting.   Genitourinary:  Negative for dysuria and hematuria.  Musculoskeletal:  Negative for arthralgias and back pain.  Skin:  Negative for color change and rash.  Neurological:  Negative for seizures and syncope.  All other systems reviewed and are negative.   Physical Exam Updated Vital Signs BP (!) 143/65   Pulse (!) 52   Temp 97.8 F (36.6 C) (Oral)   Resp 16   SpO2 98%  Physical Exam Vitals and nursing note reviewed.  Constitutional:      General: He is not in acute distress.    Appearance: Normal appearance. He is well-developed.  HENT:     Head: Normocephalic and atraumatic.  Eyes:     Extraocular Movements: Extraocular movements intact.     Conjunctiva/sclera: Conjunctivae normal.     Pupils: Pupils are equal, round, and reactive to light.  Cardiovascular:     Rate and Rhythm: Regular rhythm. Bradycardia present.     Heart sounds: No murmur heard. Pulmonary:     Effort: Pulmonary effort is normal. No respiratory distress.     Breath sounds: Normal breath sounds.  Abdominal:     Palpations: Abdomen is soft.     Tenderness: There is no abdominal tenderness.  Musculoskeletal:        General: No swelling.     Cervical back: Normal range of motion and neck supple. No rigidity.     Right lower leg: No edema.     Left lower leg: No edema.  Skin:    General: Skin is warm and dry.     Capillary Refill: Capillary refill takes less than 2 seconds.  Neurological:     General: No focal deficit present.     Mental Status: He is alert. Mental status is at baseline.     Cranial Nerves: No cranial nerve deficit.     Sensory: No sensory deficit.     Motor: No weakness.  Psychiatric:        Mood and Affect: Mood normal.     ED Results / Procedures / Treatments   Labs (all labs ordered are listed, but only abnormal results are displayed) Labs Reviewed  BASIC METABOLIC PANEL - Abnormal; Notable for the following components:      Result Value   Glucose, Bld 192 (*)     Creatinine, Ser 1.28 (*)    Calcium 8.7 (*)    GFR, Estimated 57 (*)    All other components within normal limits  CBC - Abnormal; Notable for the following components:   RBC 4.01 (*)    Hemoglobin 12.1 (*)    HCT 37.8 (*)    All other components within normal limits  URINALYSIS, ROUTINE W REFLEX MICROSCOPIC - Abnormal; Notable for the following components:   Glucose, UA >=500 (*)  Ketones, ur 5 (*)    All other components within normal limits  HEPATIC FUNCTION PANEL - Abnormal; Notable for the following components:   Total Protein 6.1 (*)    Albumin 3.3 (*)    All other components within normal limits  CBG MONITORING, ED - Abnormal; Notable for the following components:   Glucose-Capillary 180 (*)    All other components within normal limits  LIPASE, BLOOD  AMMONIA    EKG EKG Interpretation Date/Time:  Sunday April 19 2023 14:23:47 EDT Ventricular Rate:  52 PR Interval:  126 QRS Duration:  130 QT Interval:  492 QTC Calculation: 457 R Axis:   73  Text Interpretation: Sinus bradycardia Non-specific intra-ventricular conduction block Cannot rule out Inferior infarct , age undetermined Abnormal ECG When compared with ECG of 28-Sep-2022 19:45, PREVIOUS ECG IS PRESENT Confirmed by Vanetta Mulders [phone]) on 04/19/2023 3:39:20 PM  Radiology DG Chest Port 1 View  Result Date: 04/19/2023 CLINICAL DATA:  Lethargy and shortness of breath. EXAM: PORTABLE CHEST 1 VIEW COMPARISON:  Chest radiograph dated 09/28/2022. FINDINGS: Shallow inspiration with bibasilar atelectasis. No focal consolidation, pleural effusion, or pneumothorax. Mild cardiomegaly. Median sternotomy wires. No acute osseous pathology. IMPRESSION: 1. No active disease. 2. Mild cardiomegaly. Electronically Signed   By: Elgie Collard M.D.   On: 04/19/2023 17:23    Procedures Procedures    Medications Ordered in ED Medications  0.9 %  sodium chloride infusion ( Intravenous New Bag/Given 04/19/23 1654)    ED  Course/ Medical Decision Making/ A&P                                 Medical Decision Making Amount and/or Complexity of Data Reviewed Labs: ordered. Radiology: ordered.  Risk Prescription drug management.   Patient nontoxic no acute distress here.  Workup without any significant abnormalities.  Heart rate now is up into upper 50s.  Patient has evidence of sinus bradycardia.  Ammonia level is less than 10 hepatic function without abnormalities lipase normal.  Basic metabolic panel blood sugar 192 GFR 57 which is actually an improvement for him.  CBC no leukocytosis hemoglobin 12.1 platelets 321.  Urinalysis negative for any evidence of urinary tract infection.  And chest x-ray no active disease mild cardiomegaly.  Patient's blood pressures are good here.  Daughter little concerned about the bradycardia.  Seems to be improving here.  She could discuss this with cardiology as she gets concerned remains in the 40s she could always hold his Toprol XL.   Final Clinical Impression(s) / ED Diagnoses Final diagnoses:  Other fatigue  Bradycardia    Rx / DC Orders ED Discharge Orders     None         Vanetta Mulders, MD 04/19/23 Rickey Primus

## 2023-04-20 DIAGNOSIS — K529 Noninfective gastroenteritis and colitis, unspecified: Secondary | ICD-10-CM | POA: Diagnosis not present

## 2023-04-24 ENCOUNTER — Telehealth: Payer: Self-pay | Admitting: Cardiology

## 2023-04-24 NOTE — Telephone Encounter (Signed)
Patients wife notified and verbalized understanding. Pt's wife had no further questions or concerns at this time. Pt's wife will have pt hold Toprol- Xl at this time to see if there is any clinical improvement.

## 2023-04-24 NOTE — Telephone Encounter (Signed)
Wife called in to say that the patient was recently in the hospital. Asking that the dr take a look at the notes, to see if they need to be change made to medication. Please advise

## 2023-05-04 DIAGNOSIS — E039 Hypothyroidism, unspecified: Secondary | ICD-10-CM | POA: Diagnosis not present

## 2023-05-04 DIAGNOSIS — E1129 Type 2 diabetes mellitus with other diabetic kidney complication: Secondary | ICD-10-CM | POA: Diagnosis not present

## 2023-05-04 DIAGNOSIS — D649 Anemia, unspecified: Secondary | ICD-10-CM | POA: Diagnosis not present

## 2023-05-04 DIAGNOSIS — I10 Essential (primary) hypertension: Secondary | ICD-10-CM | POA: Diagnosis not present

## 2023-05-04 DIAGNOSIS — E785 Hyperlipidemia, unspecified: Secondary | ICD-10-CM | POA: Diagnosis not present

## 2023-05-04 DIAGNOSIS — K50819 Crohn's disease of both small and large intestine with unspecified complications: Secondary | ICD-10-CM | POA: Diagnosis not present

## 2023-05-04 DIAGNOSIS — Z1212 Encounter for screening for malignant neoplasm of rectum: Secondary | ICD-10-CM | POA: Diagnosis not present

## 2023-05-04 DIAGNOSIS — Z0000 Encounter for general adult medical examination without abnormal findings: Secondary | ICD-10-CM | POA: Diagnosis not present

## 2023-05-04 DIAGNOSIS — Z0189 Encounter for other specified special examinations: Secondary | ICD-10-CM | POA: Diagnosis not present

## 2023-05-06 ENCOUNTER — Telehealth: Payer: Self-pay | Admitting: Pharmacist

## 2023-05-06 NOTE — Progress Notes (Signed)
05/06/2023  Patient ID: Patrick Miranda, male   DOB: 06-13-1946, 77 y.o.   MRN: [id number]   Reason for referral: Medication Assistance  Referral source: 2025 Re-enrollement  Referral medication(s): [name], [name] Current insurance:[employer]   Objective: Allergies  Allergen Reactions   Lipitor [Atorvastatin] Other (See Comments)    Muscle pain   Sitagliptin     Myalgias Other reaction(s): myalgias    Medications Reviewed Today     Reviewed by Patrick Miranda, [facility] (Pharmacist) on 05/06/23 at 0906  Med List Status: <None>   Medication Order Taking? Sig Documenting Provider Last Dose Status Informant  apixaban (ELIQUIS) 5 MG TABS tablet [id number] Yes TAKE 1 TABLET(5 MG) BY MOUTH TWICE DAILY Patrick Sidle, MD Taking Active   BD PEN NEEDLE NANO U/F 32G X 4 MM MISC [id number] Yes 1 each by Other route daily.  [provider] Taking Active Spouse/Significant Other  Biotin 5000 MCG TABS [id number] Yes Take 5,000 mcg by mouth daily. [provider] Taking Active Spouse/Significant Other  budesonide (ENTOCORT EC) 3 MG 24 hr capsule [id number] Yes Take 3 mg by mouth daily. [provider] Taking Active   Continuous Blood Gluc Sensor (FREESTYLE LIBRE 2 SENSOR) [place] [id number] Yes See admin instructions. [provider] Taking Active Spouse/Significant Other  donepezil (ARICEPT) 10 MG tablet [id number] Yes Take 1 tablet (10 mg total) by mouth at bedtime. Patrick Austin, NP Taking Active   fenofibrate 160 MG tablet [id number] Yes 1 capsule with a meal Orally Once a day [provider] Taking Active   Insulin Aspart (NOVOLOG FLEXPEN [place]) [id number] Yes Inject 0-15 Units into the skin 3 (three) times daily before meals. Dose per sliding scale [provider] Taking Active Spouse/Significant Other  insulin degludec (TRESIBA FLEXTOUCH) 100 UNIT/ML FlexTouch Pen [id number] Yes Inject 38 Units into the skin daily. [provider]  Taking Active   [name] 10 MG TABS tablet [id number] Yes TAKE 1 TABLET(10 MG) BY MOUTH DAILY BEFORE BREAKFAST Patrick Sidle, MD Taking Active   levothyroxine (SYNTHROID) 50 MCG tablet [id number] Yes Take 50 mcg by mouth every morning. [provider] Taking Active   losartan (COZAAR) 25 MG tablet [id number] Yes TAKE 1/2 TABLET(12.5 MG) BY MOUTH DAILY Patrick Sidle, MD Taking Active   memantine [facility]) 10 MG tablet [id number] Yes Take 1 tablet (10 mg total) by mouth 2 (two) times daily. Patrick Austin, NP Taking Active   metFORMIN (GLUCOPHAGE) 500 MG tablet [id number] Yes Take 500 mg by mouth 2 (two) times daily with a meal. [provider] Taking Active Spouse/Significant Other  Discontinued 05/06/23 0906 (Completed Course)   metoprolol succinate (TOPROL-XL) 25 MG 24 hr tablet [id number] Yes Take 0.5 tablets (12.5 mg total) by mouth daily. Patrick Lennox, PA-C Taking Active   mirabegron ER (MYRBETRIQ) 25 MG TB24 tablet [id number] Yes Take 25 mg by mouth daily. [provider] Taking Active   Multiple Vitamin (MULTIVITAMIN WITH MINERALS) TABS tablet [id number] Yes Take 1 tablet by mouth daily. [provider] Taking Active Spouse/Significant Other  rosuvastatin (CRESTOR) 20 MG tablet [id number] Yes TAKE 1 TABLET(20 MG) BY MOUTH DAILY Patrick Sidle, MD Taking Active   sertraline (ZOLOFT) 25 MG tablet [id number] Yes Take 1 tablet (25 mg total) by mouth daily. Patrick Austin, NP Taking Active   tamsulosin (FLOMAX) 0.4 MG CAPS capsule [id number] Yes Take 0.4 mg by mouth daily. [provider] Taking Active  Assessment: Medication Assistance Findings:  Medication assistance needs identified: Spoke with Patient's wife, Patrick Miranda.  HIPAA identifiers were obtained.  Patrick Miranda confirmed the patient received [name], and Novolog through patient assistance programming last year and will need assistance for next year. Patient's  medications were reviewed.  HGA1c- 8.3%     Additional medication assistance options reviewed with patient as warranted:  No other options identified  Plan: I will route patient assistance letter to [facility] pharmacy technician who will coordinate patient assistance program application process for medications listed above.  [facility] pharmacy technician will assist with obtaining all required documents from both patient and provider(s) and submit application(s) once completed.     Patrick Miranda, PharmD, BCACP [facility] Clinical Pharmacist [phone]

## 2023-05-11 DIAGNOSIS — I10 Essential (primary) hypertension: Secondary | ICD-10-CM | POA: Diagnosis not present

## 2023-05-11 DIAGNOSIS — E1139 Type 2 diabetes mellitus with other diabetic ophthalmic complication: Secondary | ICD-10-CM | POA: Diagnosis not present

## 2023-05-11 DIAGNOSIS — Z1339 Encounter for screening examination for other mental health and behavioral disorders: Secondary | ICD-10-CM | POA: Diagnosis not present

## 2023-05-11 DIAGNOSIS — Z23 Encounter for immunization: Secondary | ICD-10-CM | POA: Diagnosis not present

## 2023-05-11 DIAGNOSIS — I4891 Unspecified atrial fibrillation: Secondary | ICD-10-CM | POA: Diagnosis not present

## 2023-05-11 DIAGNOSIS — Z1331 Encounter for screening for depression: Secondary | ICD-10-CM | POA: Diagnosis not present

## 2023-05-11 DIAGNOSIS — D649 Anemia, unspecified: Secondary | ICD-10-CM | POA: Diagnosis not present

## 2023-05-11 DIAGNOSIS — I129 Hypertensive chronic kidney disease with stage 1 through stage 4 chronic kidney disease, or unspecified chronic kidney disease: Secondary | ICD-10-CM | POA: Diagnosis not present

## 2023-05-11 DIAGNOSIS — I700 Atherosclerosis of aorta: Secondary | ICD-10-CM | POA: Diagnosis not present

## 2023-05-11 DIAGNOSIS — I5022 Chronic systolic (congestive) heart failure: Secondary | ICD-10-CM | POA: Diagnosis not present

## 2023-05-11 DIAGNOSIS — Z0000 Encounter for general adult medical examination without abnormal findings: Secondary | ICD-10-CM | POA: Diagnosis not present

## 2023-05-11 DIAGNOSIS — R2689 Other abnormalities of gait and mobility: Secondary | ICD-10-CM | POA: Diagnosis not present

## 2023-05-11 DIAGNOSIS — E11319 Type 2 diabetes mellitus with unspecified diabetic retinopathy without macular edema: Secondary | ICD-10-CM | POA: Diagnosis not present

## 2023-05-11 DIAGNOSIS — E785 Hyperlipidemia, unspecified: Secondary | ICD-10-CM | POA: Diagnosis not present

## 2023-05-11 DIAGNOSIS — N400 Enlarged prostate without lower urinary tract symptoms: Secondary | ICD-10-CM | POA: Diagnosis not present

## 2023-05-11 DIAGNOSIS — R82998 Other abnormal findings in urine: Secondary | ICD-10-CM | POA: Diagnosis not present

## 2023-05-11 DIAGNOSIS — K50918 Crohn's disease, unspecified, with other complication: Secondary | ICD-10-CM | POA: Diagnosis not present

## 2023-05-11 DIAGNOSIS — F03A0 Unspecified dementia, mild, without behavioral disturbance, psychotic disturbance, mood disturbance, and anxiety: Secondary | ICD-10-CM | POA: Diagnosis not present

## 2023-05-11 DIAGNOSIS — E1129 Type 2 diabetes mellitus with other diabetic kidney complication: Secondary | ICD-10-CM | POA: Diagnosis not present

## 2023-05-11 DIAGNOSIS — E039 Hypothyroidism, unspecified: Secondary | ICD-10-CM | POA: Diagnosis not present

## 2023-05-15 ENCOUNTER — Telehealth: Payer: Self-pay | Admitting: Pharmacy Technician

## 2023-05-15 DIAGNOSIS — Z5986 Financial insecurity: Secondary | ICD-10-CM

## 2023-05-15 NOTE — Progress Notes (Signed)
[facility] Customer service manager [facility])                                            [facility] Quality Pharmacy Team    05/15/2023  Patrick Miranda [birth year] [id number]                                      Medication Assistance Referral  Referral From: [facility] RPh Patrick B.  Medication/Company: Patrick Miranda / [employer] Patient application portion:  Mining engineer portion: Faxed  to Dr. Geoffry Paradise Provider address/fax verified via: Office website  Medication/Company: Patrick Miranda / [employer] Patient application portion:  Mining engineer portion: Faxed  to Dr. Geoffry Paradise Provider address/fax verified via: Office website  Medication/Company: Patrick Miranda / Patrick Miranda Patient application portion:  Mailed Provider application portion: Faxed  to Dr. Nona Dell Provider address/fax verified via: Office website  Patrick Miranda, CPhT [place]  Office: [phone] Fax: [phone] Email: [email or web address]

## 2023-05-18 DIAGNOSIS — E1129 Type 2 diabetes mellitus with other diabetic kidney complication: Secondary | ICD-10-CM | POA: Diagnosis not present

## 2023-05-18 DIAGNOSIS — K50819 Crohn's disease of both small and large intestine with unspecified complications: Secondary | ICD-10-CM | POA: Diagnosis not present

## 2023-05-19 DIAGNOSIS — H2513 Age-related nuclear cataract, bilateral: Secondary | ICD-10-CM | POA: Diagnosis not present

## 2023-05-19 DIAGNOSIS — H52203 Unspecified astigmatism, bilateral: Secondary | ICD-10-CM | POA: Diagnosis not present

## 2023-05-21 ENCOUNTER — Other Ambulatory Visit: Payer: Self-pay | Admitting: Anesthesiology

## 2023-05-21 MED ORDER — SERTRALINE HCL 25 MG PO TABS: 25.0000 mg | ORAL_TABLET | Freq: Every day | ORAL | 3 refills | Status: DC

## 2023-06-10 DIAGNOSIS — H906 Mixed conductive and sensorineural hearing loss, bilateral: Secondary | ICD-10-CM | POA: Diagnosis not present

## 2023-06-15 DIAGNOSIS — K50819 Crohn's disease of both small and large intestine with unspecified complications: Secondary | ICD-10-CM | POA: Diagnosis not present

## 2023-06-16 ENCOUNTER — Ambulatory Visit: Payer: Medicare HMO | Admitting: Podiatry

## 2023-06-16 DIAGNOSIS — E1129 Type 2 diabetes mellitus with other diabetic kidney complication: Secondary | ICD-10-CM | POA: Diagnosis not present

## 2023-06-16 DIAGNOSIS — M7021 Olecranon bursitis, right elbow: Secondary | ICD-10-CM | POA: Diagnosis not present

## 2023-06-16 DIAGNOSIS — M25521 Pain in right elbow: Secondary | ICD-10-CM | POA: Diagnosis not present

## 2023-06-17 ENCOUNTER — Inpatient Hospital Stay (HOSPITAL_COMMUNITY)
Admission: EM | Admit: 2023-06-17 | Discharge: 2023-06-19 | DRG: 603 | Disposition: A | Discharge status: Home / Self Care | Payer: Medicare HMO | Attending: Family Medicine | Admitting: Family Medicine

## 2023-06-17 ENCOUNTER — Ambulatory Visit: Payer: Medicare HMO | Admitting: Podiatry

## 2023-06-17 ENCOUNTER — Other Ambulatory Visit: Payer: Self-pay

## 2023-06-17 ENCOUNTER — Encounter (HOSPITAL_COMMUNITY): Payer: Self-pay

## 2023-06-17 DIAGNOSIS — H918X3 Other specified hearing loss, bilateral: Secondary | ICD-10-CM | POA: Diagnosis present

## 2023-06-17 DIAGNOSIS — I10 Essential (primary) hypertension: Secondary | ICD-10-CM | POA: Diagnosis present

## 2023-06-17 DIAGNOSIS — E1122 Type 2 diabetes mellitus with diabetic chronic kidney disease: Secondary | ICD-10-CM | POA: Diagnosis not present

## 2023-06-17 DIAGNOSIS — A419 Sepsis, unspecified organism: Secondary | ICD-10-CM | POA: Diagnosis not present

## 2023-06-17 DIAGNOSIS — Z85828 Personal history of other malignant neoplasm of skin: Secondary | ICD-10-CM

## 2023-06-17 DIAGNOSIS — I255 Ischemic cardiomyopathy: Secondary | ICD-10-CM | POA: Diagnosis present

## 2023-06-17 DIAGNOSIS — M71121 Other infective bursitis, right elbow: Secondary | ICD-10-CM | POA: Diagnosis present

## 2023-06-17 DIAGNOSIS — Z96653 Presence of artificial knee joint, bilateral: Secondary | ICD-10-CM | POA: Diagnosis present

## 2023-06-17 DIAGNOSIS — Z7984 Long term (current) use of oral hypoglycemic drugs: Secondary | ICD-10-CM

## 2023-06-17 DIAGNOSIS — Z7901 Long term (current) use of anticoagulants: Secondary | ICD-10-CM | POA: Diagnosis not present

## 2023-06-17 DIAGNOSIS — R6520 Severe sepsis without septic shock: Secondary | ICD-10-CM | POA: Diagnosis not present

## 2023-06-17 DIAGNOSIS — Z794 Long term (current) use of insulin: Secondary | ICD-10-CM

## 2023-06-17 DIAGNOSIS — N400 Enlarged prostate without lower urinary tract symptoms: Secondary | ICD-10-CM | POA: Diagnosis present

## 2023-06-17 DIAGNOSIS — I5042 Chronic combined systolic (congestive) and diastolic (congestive) heart failure: Secondary | ICD-10-CM | POA: Diagnosis not present

## 2023-06-17 DIAGNOSIS — I5022 Chronic systolic (congestive) heart failure: Secondary | ICD-10-CM | POA: Diagnosis present

## 2023-06-17 DIAGNOSIS — G4733 Obstructive sleep apnea (adult) (pediatric): Secondary | ICD-10-CM | POA: Diagnosis not present

## 2023-06-17 DIAGNOSIS — I252 Old myocardial infarction: Secondary | ICD-10-CM

## 2023-06-17 DIAGNOSIS — K5090 Crohn's disease, unspecified, without complications: Secondary | ICD-10-CM | POA: Diagnosis present

## 2023-06-17 DIAGNOSIS — M7021 Olecranon bursitis, right elbow: Secondary | ICD-10-CM | POA: Diagnosis not present

## 2023-06-17 DIAGNOSIS — M25521 Pain in right elbow: Secondary | ICD-10-CM | POA: Diagnosis not present

## 2023-06-17 DIAGNOSIS — D631 Anemia in chronic kidney disease: Secondary | ICD-10-CM | POA: Diagnosis not present

## 2023-06-17 DIAGNOSIS — N189 Chronic kidney disease, unspecified: Secondary | ICD-10-CM | POA: Diagnosis present

## 2023-06-17 DIAGNOSIS — E785 Hyperlipidemia, unspecified: Secondary | ICD-10-CM | POA: Diagnosis present

## 2023-06-17 DIAGNOSIS — F0390 Unspecified dementia without behavioral disturbance: Secondary | ICD-10-CM | POA: Diagnosis present

## 2023-06-17 DIAGNOSIS — Z8249 Family history of ischemic heart disease and other diseases of the circulatory system: Secondary | ICD-10-CM | POA: Diagnosis not present

## 2023-06-17 DIAGNOSIS — F32A Depression, unspecified: Secondary | ICD-10-CM | POA: Diagnosis present

## 2023-06-17 DIAGNOSIS — I2510 Atherosclerotic heart disease of native coronary artery without angina pectoris: Secondary | ICD-10-CM | POA: Diagnosis present

## 2023-06-17 DIAGNOSIS — N179 Acute kidney failure, unspecified: Secondary | ICD-10-CM | POA: Diagnosis not present

## 2023-06-17 DIAGNOSIS — I130 Hypertensive heart and chronic kidney disease with heart failure and stage 1 through stage 4 chronic kidney disease, or unspecified chronic kidney disease: Secondary | ICD-10-CM | POA: Diagnosis not present

## 2023-06-17 DIAGNOSIS — E1165 Type 2 diabetes mellitus with hyperglycemia: Secondary | ICD-10-CM

## 2023-06-17 DIAGNOSIS — K50919 Crohn's disease, unspecified, with unspecified complications: Secondary | ICD-10-CM | POA: Diagnosis not present

## 2023-06-17 DIAGNOSIS — N1831 Chronic kidney disease, stage 3a: Secondary | ICD-10-CM | POA: Diagnosis present

## 2023-06-17 DIAGNOSIS — Z8673 Personal history of transient ischemic attack (TIA), and cerebral infarction without residual deficits: Secondary | ICD-10-CM

## 2023-06-17 DIAGNOSIS — Z9079 Acquired absence of other genital organ(s): Secondary | ICD-10-CM

## 2023-06-17 DIAGNOSIS — M7110 Other infective bursitis, unspecified site: Principal | ICD-10-CM | POA: Diagnosis present

## 2023-06-17 DIAGNOSIS — Z8616 Personal history of COVID-19: Secondary | ICD-10-CM

## 2023-06-17 DIAGNOSIS — L03113 Cellulitis of right upper limb: Principal | ICD-10-CM | POA: Diagnosis present

## 2023-06-17 DIAGNOSIS — Z79899 Other long term (current) drug therapy: Secondary | ICD-10-CM

## 2023-06-17 DIAGNOSIS — Z79890 Hormone replacement therapy (postmenopausal): Secondary | ICD-10-CM

## 2023-06-17 DIAGNOSIS — I482 Chronic atrial fibrillation, unspecified: Secondary | ICD-10-CM | POA: Diagnosis present

## 2023-06-17 DIAGNOSIS — Z951 Presence of aortocoronary bypass graft: Secondary | ICD-10-CM

## 2023-06-17 DIAGNOSIS — Z86711 Personal history of pulmonary embolism: Secondary | ICD-10-CM

## 2023-06-17 LAB — CBC WITH DIFFERENTIAL/PLATELET
Abs Immature Granulocytes: 0.05 10*3/uL (ref 0.00–0.07)
Basophils Absolute: 0 10*3/uL (ref 0.0–0.1)
Basophils Relative: 0 %
Eosinophils Absolute: 0 10*3/uL (ref 0.0–0.5)
Eosinophils Relative: 0 %
HCT: 33.5 % — ABNORMAL LOW (ref 39.0–52.0)
Hemoglobin: 10.4 g/dL — ABNORMAL LOW (ref 13.0–17.0)
Immature Granulocytes: 1 %
Lymphocytes Relative: 13 %
Lymphs Abs: 1.3 10*3/uL (ref 0.7–4.0)
MCH: 28.9 pg (ref 26.0–34.0)
MCHC: 31 g/dL (ref 30.0–36.0)
MCV: 93.1 fL (ref 80.0–100.0)
Monocytes Absolute: 0.8 10*3/uL (ref 0.1–1.0)
Monocytes Relative: 8 %
Neutro Abs: 7.6 10*3/uL (ref 1.7–7.7)
Neutrophils Relative %: 78 %
Platelets: 246 10*3/uL (ref 150–400)
RBC: 3.6 MIL/uL — ABNORMAL LOW (ref 4.22–5.81)
RDW: 14.3 % (ref 11.5–15.5)
WBC: 9.8 10*3/uL (ref 4.0–10.5)
nRBC: 0 % (ref 0.0–0.2)

## 2023-06-17 LAB — LACTIC ACID, PLASMA: Lactic Acid, Venous: 1.6 mmol/L (ref 0.5–1.9)

## 2023-06-17 LAB — COMPREHENSIVE METABOLIC PANEL
ALT: 12 U/L (ref 0–44)
AST: 23 U/L (ref 15–41)
Albumin: 3 g/dL — ABNORMAL LOW (ref 3.5–5.0)
Alkaline Phosphatase: 46 U/L (ref 38–126)
Anion gap: 5 (ref 5–15)
BUN: 26 mg/dL — ABNORMAL HIGH (ref 8–23)
CO2: 23 mmol/L (ref 22–32)
Calcium: 8.5 mg/dL — ABNORMAL LOW (ref 8.9–10.3)
Chloride: 105 mmol/L (ref 98–111)
Creatinine, Ser: 1.9 mg/dL — ABNORMAL HIGH (ref 0.61–1.24)
GFR, Estimated: 36 mL/min — ABNORMAL LOW (ref >60)
Glucose, Bld: 431 mg/dL — ABNORMAL HIGH (ref 70–99)
Potassium: 4.2 mmol/L (ref 3.5–5.1)
Sodium: 133 mmol/L — ABNORMAL LOW (ref 135–145)
Total Bilirubin: 0.4 mg/dL (ref <1.2)
Total Protein: 6 g/dL — ABNORMAL LOW (ref 6.5–8.1)

## 2023-06-17 MED ORDER — ACETAMINOPHEN 500 MG PO TABS
1000.0000 mg | ORAL_TABLET | Freq: Once | ORAL | Status: AC
  Administered 2023-06-17: 1000 mg via ORAL
  Filled 2023-06-17: qty 2

## 2023-06-17 MED ORDER — ROSUVASTATIN CALCIUM 20 MG PO TABS
20.0000 mg | ORAL_TABLET | Freq: Every day | ORAL | Status: DC
  Administered 2023-06-18 – 2023-06-19 (×2): 20 mg via ORAL
  Filled 2023-06-17 (×2): qty 1

## 2023-06-17 MED ORDER — SODIUM CHLORIDE 0.9 % IV SOLN
1.0000 g | Freq: Once | INTRAVENOUS | Status: AC
  Administered 2023-06-17: 1 g via INTRAVENOUS
  Filled 2023-06-17: qty 10

## 2023-06-17 MED ORDER — ONDANSETRON HCL 4 MG/2ML IJ SOLN: 4.0000 mg | Freq: Three times a day (TID) | INTRAMUSCULAR | Status: DC | PRN

## 2023-06-17 MED ORDER — BUDESONIDE 3 MG PO CPEP
3.0000 mg | ORAL_CAPSULE | Freq: Every day | ORAL | Status: DC
  Administered 2023-06-18 – 2023-06-19 (×2): 3 mg via ORAL
  Filled 2023-06-17 (×2): qty 1

## 2023-06-17 MED ORDER — ONDANSETRON HCL 4 MG PO TABS: 4.0000 mg | ORAL_TABLET | Freq: Three times a day (TID) | ORAL | Status: DC | PRN

## 2023-06-17 MED ORDER — LACTATED RINGERS IV BOLUS
1000.0000 mL | Freq: Once | INTRAVENOUS | Status: AC
  Administered 2023-06-17: 1000 mL via INTRAVENOUS

## 2023-06-17 MED ORDER — INSULIN ASPART 100 UNIT/ML IJ SOLN
0.0000 [IU] | INTRAMUSCULAR | Status: DC
  Administered 2023-06-18: 8 [IU] via SUBCUTANEOUS
  Administered 2023-06-18: 3 [IU] via SUBCUTANEOUS
  Administered 2023-06-18: 2 [IU] via SUBCUTANEOUS
  Administered 2023-06-18: 5 [IU] via SUBCUTANEOUS
  Administered 2023-06-18: 3 [IU] via SUBCUTANEOUS
  Administered 2023-06-19 (×2): 2 [IU] via SUBCUTANEOUS

## 2023-06-17 MED ORDER — MEMANTINE HCL 10 MG PO TABS
10.0000 mg | ORAL_TABLET | Freq: Two times a day (BID) | ORAL | Status: DC
  Administered 2023-06-18 – 2023-06-19 (×4): 10 mg via ORAL
  Filled 2023-06-17 (×4): qty 1

## 2023-06-17 MED ORDER — ACETAMINOPHEN 325 MG PO TABS
650.0000 mg | ORAL_TABLET | Freq: Four times a day (QID) | ORAL | Status: DC | PRN
  Administered 2023-06-18: 650 mg via ORAL
  Filled 2023-06-17: qty 2

## 2023-06-17 MED ORDER — INSULIN GLARGINE-YFGN 100 UNIT/ML ~~LOC~~ SOLN
30.0000 [IU] | Freq: Every day | SUBCUTANEOUS | Status: DC
  Administered 2023-06-18: 30 [IU] via SUBCUTANEOUS
  Filled 2023-06-17 (×3): qty 0.3

## 2023-06-17 MED ORDER — APIXABAN 5 MG PO TABS
5.0000 mg | ORAL_TABLET | Freq: Two times a day (BID) | ORAL | Status: DC
  Administered 2023-06-18 – 2023-06-19 (×4): 5 mg via ORAL
  Filled 2023-06-17 (×4): qty 1

## 2023-06-17 MED ORDER — SODIUM CHLORIDE 0.9 % IV SOLN
2.0000 g | INTRAVENOUS | Status: DC
  Administered 2023-06-18 – 2023-06-19 (×2): 2 g via INTRAVENOUS
  Filled 2023-06-17: qty 20

## 2023-06-17 MED ORDER — TAMSULOSIN HCL 0.4 MG PO CAPS
0.4000 mg | ORAL_CAPSULE | Freq: Every day | ORAL | Status: DC
  Administered 2023-06-18 – 2023-06-19 (×2): 0.4 mg via ORAL
  Filled 2023-06-17 (×2): qty 1

## 2023-06-17 MED ORDER — VANCOMYCIN HCL 1500 MG/300ML IV SOLN: 1500.0000 mg | INTRAVENOUS | Status: DC

## 2023-06-17 MED ORDER — VANCOMYCIN HCL 1750 MG/350ML IV SOLN
1750.0000 mg | Freq: Once | INTRAVENOUS | Status: AC
  Administered 2023-06-17: 1750 mg via INTRAVENOUS
  Filled 2023-06-17: qty 350

## 2023-06-17 MED ORDER — ACETAMINOPHEN 650 MG RE SUPP: 650.0000 mg | Freq: Four times a day (QID) | RECTAL | Status: DC | PRN

## 2023-06-17 MED ORDER — DONEPEZIL HCL 5 MG PO TABS
10.0000 mg | ORAL_TABLET | Freq: Every day | ORAL | Status: DC
  Administered 2023-06-18 (×2): 10 mg via ORAL
  Filled 2023-06-17 (×2): qty 2

## 2023-06-17 MED ORDER — SERTRALINE HCL 50 MG PO TABS
25.0000 mg | ORAL_TABLET | Freq: Every day | ORAL | Status: DC
  Administered 2023-06-18 – 2023-06-19 (×2): 25 mg via ORAL
  Filled 2023-06-17 (×2): qty 1

## 2023-06-17 MED ORDER — SODIUM CHLORIDE 0.9 % IV SOLN: INTRAVENOUS | Status: AC

## 2023-06-17 MED ORDER — POLYETHYLENE GLYCOL 3350 17 G PO PACK: 17.0000 g | PACK | Freq: Every day | ORAL | Status: DC | PRN

## 2023-06-17 MED ORDER — LEVOTHYROXINE SODIUM 50 MCG PO TABS
50.0000 ug | ORAL_TABLET | Freq: Every day | ORAL | Status: DC
  Administered 2023-06-18 – 2023-06-19 (×2): 50 ug via ORAL
  Filled 2023-06-17 (×2): qty 1

## 2023-06-17 NOTE — Assessment & Plan Note (Signed)
Stable and compensated.  Last echo 06/2022 EF of 45 to 50% with grade 1 DD.  Not on diuretics.

## 2023-06-17 NOTE — ED Provider Notes (Signed)
[place] EMERGENCY DEPARTMENT AT [facility] Provider Note  CSN: [id number] Arrival date & time: 06/17/23 1853  Chief Complaint(s) Joint Swelling  HPI Patrick Miranda is a 77 y.o. male history of prior stroke, dementia, ischemic cardiomyopathy, atrial fibrillation on Eliquis presenting to the emergency department with right arm redness.  Patient initially saw his primary doctor, was diagnosed with septic bursitis and started on doxycycline.  He then went to [facility] today where they changed him to Keflex and then performed an aspiration of his bursa.  They advised him to come to the emergency department if he had a fever.  He had a fever, so he came to the emergency department.  Patient and family report redness over the elbow extending up and down the arm.  He reports some pain, not worse with movement of the arm.  No chest pain, shortness of breath, abdominal pain, lightheadedness or dizziness, fainting.   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 s/p CABG in 06/2020 with LIMA-LAD, SVG-D1 and SVG-RCA   COVID-07 Dec 2019   Dementia [facility])    Depression    Essential hypertension    Hernia, umbilical    HOH (hard of hearing)    Hyperlipidemia    Ischemic cardiomyopathy    Ischemic necrosis of small bowel (HCC)    Left ventricular mural thrombus    NSTEMI (non-ST elevated myocardial infarction) (HCC)    OSA on CPAP    Pulmonary emboli [facility])    May 2021   Skin cancer    Type 2 diabetes mellitus (HCC)    Patient Active Problem List   Diagnosis Date Noted   Septic bursitis 06/17/2023   Lower urinary tract infectious disease    Chronic systolic HF (heart failure) (HCC)    Sepsis (HCC) 05/18/2021   BPH with obstruction/lower urinary tract symptoms 05/14/2021   Dementia (HCC) 09/13/2020   Umbilical hernia, incarcerated 09/13/2020    Metabolic encephalopathy 09/09/2020   Abdominal pain 08/03/2020   Chronic kidney disease, stage 3a (HCC) 08/03/2020   Mixed diabetic hyperlipidemia associated with type 2 diabetes mellitus (HCC) 08/03/2020   Type 2 diabetes mellitus with hyperglycemia, with long-term current use of insulin (HCC) 08/03/2020   Foodborne gastroenteritis 08/03/2020   SIRS (systemic inflammatory response syndrome) (HCC) 08/03/2020   Postop check 07/25/2020   Hx of CABG 07/09/2020   Coronary artery disease involving native heart 07/04/2020   Abnormal nuclear stress test    Atrial fibrillation, chronic (HCC) 12/14/2019   Hypersomnia 08/30/2019   History of adenomatous polyp of colon 05/06/2018   Vasomotor rhinitis 07/18/2017   Arthritis of knee, right 01/06/2014   CVA (cerebral vascular accident) (HCC) 09/07/2011    Class: Acute   Dysarthria 09/05/2011    Class: Acute   Primary hypertension 09/05/2011    Class: Chronic   OSA on CPAP 09/05/2011    Class: Chronic   Anemia 09/05/2011    Class: Chronic   Home Medication(s) Prior to Admission medications   Medication Sig Start Date End Date Taking? Authorizing Provider  acidophilus (RISAQUAD) CAPS capsule Take 1 capsule by mouth daily.   Yes [provider]  apixaban (ELIQUIS) 5 MG TABS tablet TAKE 1 TABLET(5 MG) BY MOUTH TWICE DAILY 02/25/22  Yes Jonelle Sidle, MD  BIOTIN PO Take 5,000 mcg by mouth daily.  Yes [provider]  budesonide (ENTOCORT EC) 3 MG 24 hr capsule Take 3 mg by mouth daily.   Yes [provider]  cephALEXin (KEFLEX) 500 MG capsule Take 500 mg by mouth 3 (three) times daily. 06/16/23  Yes [provider]  Cyanocobalamin 2500 MCG TABS Take 2,500 mcg by mouth daily.   Yes [provider]  donepezil (ARICEPT) 10 MG tablet Take 1 tablet (10 mg total) by mouth at bedtime. 04/14/23  Yes McCue, Shanda Bumps, NP  doxycycline (VIBRAMYCIN) 100 MG capsule Take 100 mg by mouth 2 (two) times daily. 06/17/23   Yes [provider]  fenofibrate 160 MG tablet 1 capsule with a meal Orally Once a day 07/15/17  Yes [provider]  Insulin Aspart (NOVOLOG FLEXPEN [place]) Inject 8-10 Units into the skin 3 (three) times daily before meals. Dose per sliding scale   Yes [provider]  insulin degludec (TRESIBA FLEXTOUCH) 100 UNIT/ML FlexTouch Pen Inject 32 Units into the skin daily.   Yes [provider]  [name] 10 MG TABS tablet TAKE 1 TABLET(10 MG) BY MOUTH DAILY BEFORE BREAKFAST 04/07/23  Yes Jonelle Sidle, MD  levothyroxine (SYNTHROID) 50 MCG tablet Take 50 mcg by mouth every morning.   Yes [provider]  losartan (COZAAR) 25 MG tablet TAKE 1/2 TABLET(12.5 MG) BY MOUTH DAILY 12/18/21  Yes Jonelle Sidle, MD  memantine (NAMENDA) 10 MG tablet Take 1 tablet (10 mg total) by mouth 2 (two) times daily. 09/25/22  Yes Ihor Austin, NP  metFORMIN (GLUCOPHAGE) 500 MG tablet Take 500 mg by mouth 2 (two) times daily with a meal. 10/10/20  Yes [provider]  Multiple Vitamin (MULTIVITAMIN WITH MINERALS) TABS tablet Take 1 tablet by mouth daily.   Yes [provider]  rosuvastatin (CRESTOR) 20 MG tablet TAKE 1 TABLET(20 MG) BY MOUTH DAILY 09/04/22  Yes Jonelle Sidle, MD  sertraline (ZOLOFT) 25 MG tablet Take 1 tablet (25 mg total) by mouth daily. 05/21/23  Yes McCue, Shanda Bumps, NP  tamsulosin (FLOMAX) 0.4 MG CAPS capsule Take 0.4 mg by mouth daily. 05/07/22  Yes [provider]  Vedolizumab (ENTYVIO IV) Inject into the vein every 28 (twenty-eight) days.   Yes [provider]  BD PEN NEEDLE NANO U/F 32G X 4 MM MISC 1 each by Other route daily.  03/19/18   [provider]  Continuous Blood Gluc Sensor (FREESTYLE LIBRE 2 SENSOR) MISC See admin instructions. 10/31/20   [provider]                                                                                                                                    Past  Surgical History Past Surgical History:  Procedure Laterality Date   COLON SURGERY     COLONOSCOPY W/ POLYPECTOMY     CORONARY ARTERY BYPASS GRAFT N/A 07/09/2020   Procedure: CORONARY ARTERY BYPASS GRAFTING (CABG) TIMES THREE ,  USING LEFT INTERNAL MAMMARY ARTERY AND RIGHT LEG GREATER SAPHENOUS VEIN HARVESTED ENDOSCOPICALLY;  Surgeon: Kerin Perna, MD;  Location: [facility] OR;  Service: Open Heart Surgery;  Laterality: N/A;   LEFT HEART CATH AND CORONARY ANGIOGRAPHY N/A 07/04/2020   Procedure: LEFT HEART CATH AND CORONARY ANGIOGRAPHY;  Surgeon: Lennette Bihari, MD;  Location: [facility] INVASIVE CV LAB;  Service: Cardiovascular;  Laterality: N/A;   TEE WITHOUT CARDIOVERSION N/A 07/09/2020   Procedure: TRANSESOPHAGEAL ECHOCARDIOGRAM (TEE);  Surgeon: Donata Clay, Theron Arista, MD;  Location: [facility] OR;  Service: Open Heart Surgery;  Laterality: N/A;   TOTAL KNEE ARTHROPLASTY Left 04/22/2013   Procedure: TOTAL KNEE ARTHROPLASTY- left;  Surgeon: Verlee Rossetti, MD;  Location: [facility] OR;  Service: Orthopedics;  Laterality: Left;  with femoral block   TOTAL KNEE ARTHROPLASTY Right 01/06/2014   Procedure: RIGHT TOTAL KNEE ARTHROPLASTY;  Surgeon: Verlee Rossetti, MD;  Location: [facility] OR;  Service: Orthopedics;  Laterality: Right;   TRANSURETHRAL RESECTION OF PROSTATE N/A 05/14/2021   Procedure: TRANSURETHRAL RESECTION OF THE PROSTATE (TURP);  Surgeon: Noel Christmas, MD;  Location: [facility] ORS;  Service: Urology;  Laterality: N/A;  90 MINS   Family History Family History  Problem Relation Age of Onset   CAD Brother        Premature   CAD Sister     Social History Social History   Tobacco Use   Smoking status: Never   Smokeless tobacco: Never  Vaping Use   Vaping status: Never Used  Substance Use Topics   Alcohol use: No   Drug use: No   Allergies Atorvastatin and Sitagliptin  Review of Systems Review of Systems  All other systems reviewed and are negative.   Physical Exam Vital Signs  I have reviewed the  triage vital signs BP (!) 126/48   Pulse 61   Temp 99.9 F (37.7 C) (Oral)   Resp 16   Ht 5\' 10"  (1.778 m)   Wt 80.3 kg   SpO2 95%   BMI 25.40 kg/m  Physical Exam Vitals and nursing note reviewed.  Constitutional:      General: He is not in acute distress.    Appearance: Normal appearance.  HENT:     Mouth/Throat:     Mouth: Mucous membranes are moist.  Eyes:     Conjunctiva/sclera: Conjunctivae normal.  Cardiovascular:     Rate and Rhythm: Normal rate and regular rhythm.  Pulmonary:     Effort: Pulmonary effort is normal. No respiratory distress.     Breath sounds: Normal breath sounds.  Abdominal:     General: Abdomen is flat.     Palpations: Abdomen is soft.     Tenderness: There is no abdominal tenderness.  Musculoskeletal:     Right lower leg: No edema.     Left lower leg: No edema.     Comments: Full range of motion of the right elbow with no pain.  Erythema extending from the posterior elbow down the forearm and up the arm to about the mid forearm and mid upper arm.  Very small amount of fluctuance around the olecranon bursa though seems almost entirely drained.  Skin:    General: Skin is warm and dry.     Capillary Refill: Capillary refill takes less than 2 seconds.  Neurological:     Mental Status: He is alert and oriented to person, place, and time. Mental status is at baseline.  Psychiatric:        Mood and Affect: Mood  normal.        Behavior: Behavior normal.     ED Results and Treatments Labs (all labs ordered are listed, but only abnormal results are displayed) Labs Reviewed  CBC WITH DIFFERENTIAL/PLATELET - Abnormal; Notable for the following components:      Result Value   RBC 3.60 (*)    Hemoglobin 10.4 (*)    HCT 33.5 (*)    All other components within normal limits  COMPREHENSIVE METABOLIC PANEL - Abnormal; Notable for the following components:   Sodium 133 (*)    Glucose, Bld 431 (*)    BUN 26 (*)    Creatinine, Ser 1.90 (*)    Calcium  8.5 (*)    Total Protein 6.0 (*)    Albumin 3.0 (*)    GFR, Estimated 36 (*)    All other components within normal limits  CULTURE, BLOOD (ROUTINE X 2)  CULTURE, BLOOD (ROUTINE X 2)  LACTIC ACID, PLASMA  LACTIC ACID, PLASMA                                                                                                                          Radiology No results found.  Pertinent labs & imaging results that were available during my care of the patient were reviewed by me and considered in my medical decision making (see MDM for details).  Medications Ordered in ED Medications  cefTRIAXone (ROCEPHIN) 1 g in sodium chloride 0.9 % 100 mL IVPB (has no administration in time range)  vancomycin (VANCOREADY) IVPB 1750 mg/350 mL (has no administration in time range)    Followed by  vancomycin (VANCOREADY) IVPB 1500 mg/300 mL (has no administration in time range)  acetaminophen (TYLENOL) tablet 1,000 mg (1,000 mg Oral Given 06/17/23 2026)  lactated ringers bolus 1,000 mL (1,000 mLs Intravenous New Bag/Given 06/17/23 2059)                                                                                                                                     Procedures Procedures  (including critical care time)  Medical Decision Making / ED Course   MDM:  77 year old male presenting to the emergency department with fever.  On exam, patient has redness to his right forearm, up to his mid right upper arm.  This seems to originate from his olecranon bursa.  Seems consistent with septic bursitis.  He had this  drained earlier today in his orthopedic office.  He denies having a fever.  He has been on antibiotics without improvement.  Discussed with orthopedic on-call for [facility], Dr. Rennis Chris, given drainage, no further orthopedic care needed, but patient should follow-up with them on outpatient.  He thinks it is reasonable to admit the patient for cellulitis given some spread.  There is no  evidence of any necrotizing infection.  He has no leukocytosis.  He did have a fever in the emergency department which improved with Tylenol.  I do not think he will need any orthopedic intervention.  I have an extremely low concern for any septic joint infection.  His labs are notable for mild AKI and he has received fluids.  Discussed with Dr. Mariea Clonts who has admitted the patient.      Additional history obtained: -Additional history obtained from family -External records from outside source obtained and reviewed including: Chart review including previous notes, labs, imaging, consultation notes including PMD visit for bursitis    Lab Tests: -I ordered, reviewed, and interpreted labs.   The pertinent results include:   Labs Reviewed  CBC WITH DIFFERENTIAL/PLATELET - Abnormal; Notable for the following components:      Result Value   RBC 3.60 (*)    Hemoglobin 10.4 (*)    HCT 33.5 (*)    All other components within normal limits  COMPREHENSIVE METABOLIC PANEL - Abnormal; Notable for the following components:   Sodium 133 (*)    Glucose, Bld 431 (*)    BUN 26 (*)    Creatinine, Ser 1.90 (*)    Calcium 8.5 (*)    Total Protein 6.0 (*)    Albumin 3.0 (*)    GFR, Estimated 36 (*)    All other components within normal limits  CULTURE, BLOOD (ROUTINE X 2)  CULTURE, BLOOD (ROUTINE X 2)  LACTIC ACID, PLASMA  LACTIC ACID, PLASMA    Notable for AKI    Medicines ordered and prescription drug management: Meds ordered this encounter  Medications   acetaminophen (TYLENOL) tablet 1,000 mg   lactated ringers bolus 1,000 mL   cefTRIAXone (ROCEPHIN) 1 g in sodium chloride 0.9 % 100 mL IVPB    Order Specific Question:   Antibiotic Indication:    Answer:   Cellulitis   FOLLOWED BY Linked Order Group    vancomycin (VANCOREADY) IVPB 1750 mg/350 mL     Order Specific Question:   Indication:     Answer:   Cellulitis    vancomycin (VANCOREADY) IVPB 1500 mg/300 mL     Order Specific  Question:   Indication:     Answer:   Cellulitis    -I have reviewed the patients home medicines and have made adjustments as needed   Consultations Obtained: I requested consultation with the orthopedic surgeon,  and discussed lab and imaging findings as well as pertinent plan - they recommend: d/c vs admit for IV abx, no further orthopedic care needed at this time    Cardiac Monitoring: The patient was maintained on a cardiac monitor.  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 s/p  CABG in 06/2020 with LIMA-LAD, SVG-D1 and SVG-RCA   COVID-07 Dec 2019   Dementia [facility])    Depression    Essential hypertension    Hernia, umbilical    HOH (hard of hearing)    Hyperlipidemia    Ischemic cardiomyopathy    Ischemic necrosis of small bowel (HCC)    Left ventricular mural thrombus    NSTEMI (non-ST elevated myocardial infarction) (HCC)    OSA on CPAP    Pulmonary emboli [facility])    May 2021   Skin cancer    Type 2 diabetes mellitus (HCC)       Dispostion: Disposition decision including need for hospitalization was considered, and patient admitted to the hospital.    Final Clinical Impression(s) / ED Diagnoses Final diagnoses:  Septic bursitis  AKI (acute kidney injury) (HCC)     This chart was dictated using voice recognition software.  Despite best efforts to proofread,  errors can occur which can change the documentation meaning.    Lonell Grandchild, MD 06/17/23 2206

## 2023-06-17 NOTE — H&P (Signed)
History and Physical    Patrick Miranda UUV:[id number] DOB: January 29, 1946 DOA: 06/17/2023  PCP: Geoffry Paradise, MD   Patient coming from: Home  I have personally briefly reviewed patient's old medical records in [facility]  Chief Complaint: Right elbow pain and swelling  HPI: Patrick Miranda is a 77 y.o. male with medical history significant for dementia, atrial fibrillation, BPH, CKD 3, systolic CHF, CABG, diabetes mellitus, OSA, Crohn's disease. On my evaluation patient is awake and alert, able to answer simple questions, but spouse provides most of the history due to patient's baseline dementia. Patient presented to the ED with complaints of right elbow pain and swelling that started 2 days ago.  He went to the urgent care yesterday and was started on a course of doxycycline, and follow-up was arranged with his orthopedist with [facility] for today.  He had incision and drainage, and was started on a course of cephalexin.  Spouse reports drainage of greenish-brown fluid from patient's elbow, sample was sent for culture.  Told to go to the ED if patient spiked a fever.  Patient had a fever of 100.1 at home hence presentation to ER.  ED Course: Tmax 100.5.  Heart rate 57-78.  Respirate rate 16.  Blood pressure systolic 120s to 474Q.  WBC 9.8.  Creatinine elevated at 1.9. EDP talked to Dr. Rennis Chris on-call for [facility] recommended giving drainage, no further Ortho care needed, patient can follow-up as outpatient.    Review of Systems: As per HPI all other systems reviewed and negative.  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 s/p CABG in 06/2020 with LIMA-LAD, SVG-D1 and SVG-RCA   COVID-07 Dec 2019   Dementia [facility])    Depression    Essential hypertension    Hernia, umbilical    HOH (hard of hearing)    Hyperlipidemia    Ischemic cardiomyopathy    Ischemic  necrosis of small bowel (HCC)    Left ventricular mural thrombus    NSTEMI (non-ST elevated myocardial infarction) (HCC)    OSA on CPAP    Pulmonary emboli [facility])    May 2021   Skin cancer    Type 2 diabetes mellitus (HCC)     Past Surgical History:  Procedure Laterality Date   COLON SURGERY     COLONOSCOPY W/ POLYPECTOMY     CORONARY ARTERY BYPASS GRAFT N/A 07/09/2020   Procedure: CORONARY ARTERY BYPASS GRAFTING (CABG) TIMES THREE , USING LEFT INTERNAL MAMMARY ARTERY AND RIGHT LEG GREATER SAPHENOUS VEIN HARVESTED ENDOSCOPICALLY;  Surgeon: Kerin Perna, MD;  Location: [facility] OR;  Service: Open Heart Surgery;  Laterality: N/A;   LEFT HEART CATH AND CORONARY ANGIOGRAPHY N/A 07/04/2020   Procedure: LEFT HEART CATH AND CORONARY ANGIOGRAPHY;  Surgeon: Lennette Bihari, MD;  Location: [facility] INVASIVE CV LAB;  Service: Cardiovascular;  Laterality: N/A;   TEE WITHOUT CARDIOVERSION N/A 07/09/2020   Procedure: TRANSESOPHAGEAL ECHOCARDIOGRAM (TEE);  Surgeon: Donata Clay, Theron Arista, MD;  Location: [facility] OR;  Service: Open Heart Surgery;  Laterality: N/A;   TOTAL KNEE ARTHROPLASTY Left 04/22/2013   Procedure: TOTAL KNEE ARTHROPLASTY- left;  Surgeon: Verlee Rossetti, MD;  Location: [facility] OR;  Service: Orthopedics;  Laterality: Left;  with femoral block   TOTAL KNEE ARTHROPLASTY Right 01/06/2014   Procedure: RIGHT TOTAL KNEE ARTHROPLASTY;  Surgeon: Verlee Rossetti, MD;  Location: [facility] OR;  Service: Orthopedics;  Laterality: Right;   TRANSURETHRAL RESECTION OF PROSTATE N/A 05/14/2021   Procedure: TRANSURETHRAL RESECTION OF THE PROSTATE (TURP);  Surgeon: Noel Christmas, MD;  Location: [facility] ORS;  Service: Urology;  Laterality: N/A;  90 MINS     reports that he has never smoked. He has never used smokeless tobacco. He reports that he does not drink alcohol and does not use drugs.  Allergies  Allergen Reactions   Atorvastatin Other (See Comments)    Muscle pain   Sitagliptin Other (See Comments)    Myalgias    Family  History  Problem Relation Age of Onset   CAD Brother        Premature   CAD Sister     Prior to Admission medications   Medication Sig Start Date End Date Taking? Authorizing Provider  acidophilus (RISAQUAD) CAPS capsule Take 1 capsule by mouth daily.   Yes [provider]  apixaban (ELIQUIS) 5 MG TABS tablet TAKE 1 TABLET(5 MG) BY MOUTH TWICE DAILY 02/25/22  Yes Jonelle Sidle, MD  BIOTIN PO Take 5,000 mcg by mouth daily.   Yes [provider]  budesonide (ENTOCORT EC) 3 MG 24 hr capsule Take 3 mg by mouth daily.   Yes [provider]  cephALEXin (KEFLEX) 500 MG capsule Take 500 mg by mouth 3 (three) times daily. 06/16/23  Yes [provider]  Cyanocobalamin 2500 MCG TABS Take 2,500 mcg by mouth daily.   Yes [provider]  donepezil (ARICEPT) 10 MG tablet Take 1 tablet (10 mg total) by mouth at bedtime. 04/14/23  Yes McCue, Shanda Bumps, NP  doxycycline (VIBRAMYCIN) 100 MG capsule Take 100 mg by mouth 2 (two) times daily. 06/17/23  Yes [provider]  fenofibrate 160 MG tablet 1 capsule with a meal Orally Once a day 07/15/17  Yes [provider]  Insulin Aspart (NOVOLOG FLEXPEN [place]) Inject 8-10 Units into the skin 3 (three) times daily before meals. Dose per sliding scale   Yes [provider]  insulin degludec (TRESIBA FLEXTOUCH) 100 UNIT/ML FlexTouch Pen Inject 32 Units into the skin daily.   Yes [provider]  [name] 10 MG TABS tablet TAKE 1 TABLET(10 MG) BY MOUTH DAILY BEFORE BREAKFAST 04/07/23  Yes Jonelle Sidle, MD  levothyroxine (SYNTHROID) 50 MCG tablet Take 50 mcg by mouth every morning.   Yes [provider]  losartan (COZAAR) 25 MG tablet TAKE 1/2 TABLET(12.5 MG) BY MOUTH DAILY 12/18/21  Yes Jonelle Sidle, MD  memantine (NAMENDA) 10 MG tablet Take 1 tablet (10 mg total) by mouth 2 (two) times daily. 09/25/22  Yes Ihor Austin, NP  metFORMIN (GLUCOPHAGE) 500 MG tablet Take 500 mg by  mouth 2 (two) times daily with a meal. 10/10/20  Yes [provider]  Multiple Vitamin (MULTIVITAMIN WITH MINERALS) TABS tablet Take 1 tablet by mouth daily.   Yes [provider]  rosuvastatin (CRESTOR) 20 MG tablet TAKE 1 TABLET(20 MG) BY MOUTH DAILY 09/04/22  Yes Jonelle Sidle, MD  sertraline (ZOLOFT) 25 MG tablet Take 1 tablet (25 mg total) by mouth daily. 05/21/23  Yes McCue, Shanda Bumps, NP  tamsulosin (FLOMAX) 0.4 MG CAPS capsule Take 0.4 mg by mouth daily. 05/07/22  Yes [provider]  Vedolizumab (ENTYVIO IV) Inject into the vein every 28 (twenty-eight) days.   Yes [provider]  BD PEN NEEDLE NANO U/F 32G X 4 MM MISC 1 each by Other route daily.  03/19/18  [provider]  Continuous Blood Gluc Sensor (FREESTYLE LIBRE 2 SENSOR) MISC See admin instructions. 10/31/20   [provider]    Physical Exam: Vitals:   06/17/23 2030 06/17/23 2100 06/17/23 2102 06/17/23 2200  BP: (!) 150/64 (!) 140/53 (!) 140/53 (!) 126/48  Pulse: 62 (!) 57 60 61  Resp:   16   Temp:   99.9 F (37.7 C)   TempSrc:   Oral   SpO2: 96% 97% 97% 95%  Weight:      Height:        Constitutional: NAD, calm, comfortable Vitals:   06/17/23 2030 06/17/23 2100 06/17/23 2102 06/17/23 2200  BP: (!) 150/64 (!) 140/53 (!) 140/53 (!) 126/48  Pulse: 62 (!) 57 60 61  Resp:   16   Temp:   99.9 F (37.7 C)   TempSrc:   Oral   SpO2: 96% 97% 97% 95%  Weight:      Height:       Eyes: PERRL, lids and conjunctivae normal ENMT: Mucous membranes are moist. Neck: normal, supple, no masses, no thyromegaly Respiratory: clear to auscultation bilaterally, no wheezing, no crackles. Normal respiratory effort. No accessory muscle use.  Cardiovascular: Regular rate and rhythm, no murmurs / rubs / gallops. No extremity edema. 2+ pedal pulses. No carotid bruits.  Abdomen: no tenderness, no masses palpated. No hepatosplenomegaly. Bowel sounds positive.  Musculoskeletal: no  clubbing / cyanosis. No joint deformity upper and lower extremities.  Skin: Mild tenderness palpation and swelling with erythema to extensor aspect of right elbow, minimal fluctuance, full range of motion flexion and extension to the right elbow joint without pain. Neurologic: CN 2-12 grossly intact. Sensation intact, DTR normal. Strength 5/5 in all 4.  Psychiatric: Normal judgment and insight. Alert and oriented x 3. Normal mood.    Due to location, unfortunately photo quality is poor.    Labs on Admission: I have personally reviewed following labs and imaging studies  CBC: Recent Labs  Lab 06/17/23 2005  WBC 9.8  NEUTROABS 7.6  HGB 10.4*  HCT 33.5*  MCV 93.1  PLT 246   Basic Metabolic Panel: Recent Labs  Lab 06/17/23 2005  NA 133*  K 4.2  CL 105  CO2 23  GLUCOSE 431*  BUN 26*  CREATININE 1.90*  CALCIUM 8.5*   GFR: Estimated Creatinine Clearance: 33.1 mL/min (A) (by C-G formula based on SCr of 1.9 mg/dL (H)). Liver Function Tests: Recent Labs  Lab 06/17/23 2005  AST 23  ALT 12  ALKPHOS 46  BILITOT 0.4  PROT 6.0*  ALBUMIN 3.0*   Urine analysis:    Component Value Date/Time   COLORURINE YELLOW 04/19/2023 1345   APPEARANCEUR CLEAR 04/19/2023 1345   LABSPEC 1.026 04/19/2023 1345   PHURINE 5.0 04/19/2023 1345   GLUCOSEU >=500 (A) 04/19/2023 1345   HGBUR NEGATIVE 04/19/2023 1345   BILIRUBINUR NEGATIVE 04/19/2023 1345   KETONESUR 5 (A) 04/19/2023 1345   PROTEINUR NEGATIVE 04/19/2023 1345   UROBILINOGEN 0.2 01/08/2014 1550   NITRITE NEGATIVE 04/19/2023 1345   LEUKOCYTESUR NEGATIVE 04/19/2023 1345    Radiological Exams on Admission: No results found.  EKG:  None .   Assessment/Plan Principal Problem:   Septic bursitis with surrounding cellulitis Active Problems:   Type 2 diabetes mellitus with hyperglycemia, with long-term current use of insulin (HCC)   Acute kidney injury superimposed on CKD (HCC)   Atrial fibrillation, chronic (HCC)   Chronic  systolic HF (heart failure) (HCC)   Primary hypertension  OSA on CPAP   Hx of CABG   Dementia (HCC)   Crohn disease (HCC)   Assessment and Plan: * Septic bursitis with surrounding cellulitis Swelling to bursa area on extensor aspect of right elbow, with mild tenderness, erythema and mild fluctuance.  Full range of motion to right elbow, doubt joint involvement.  Febrile to 100.5.  WBC 9.8.  Normal lactic acid 1.6.  Rules out for sepsis.  - Incision and drainage done by [facility] earlier today, - EDP talked to Dr. Rennis Chris on-call for [facility],recommended giving drainage, no further Ortho care needed, patient can follow-up as outpatient.  -Follow-up with [facility] for culture results - Follow-up blood cultures -IV ceftriaxone 2 g daily, and vancomycin started in ED -He is on budesonide and Entyvio, doubt need for stress dose steroids at this time  Acute kidney injury superimposed on CKD (HCC) AKI on CKD 3A.  Creatinine 1.9, baseline 1.2-1.5. -Hold losartan 25 mg -1 L LR bolus given, continue N/s 75cc/hr x 15hrs  Type 2 diabetes mellitus with hyperglycemia, with long-term current use of insulin (HCC) Blood glucose 431. -1 L bolus given -Resume Tresiba 30 units daily - SSI- M Q4h for now till improvement in blood glucose -Hold metformin and [name] - Hgba1c  Chronic systolic HF (heart failure) (HCC) Stable and compensated.  Last echo 06/2022 EF of 45 to 50% with grade 1 DD.  Not on diuretics.  Atrial fibrillation, chronic (HCC) RAte controlled and on anticoagulation. -Resume Eliquis, metoprolol  Primary hypertension Stable. -Hold 25 mg losartan for now with AKI -Resume metoprolol, tamsulosin for BPH  Crohn disease (HCC) - Resume oral budesonide - He is on Entyvio lesions  Dementia (HCC) Stable.  Able to answer simple questions but unable to give details.  Lives at home. -Resume Zoloft, donepezil, memantine  Hx of CABG Stable no chest pain.   DVT prophylaxis:  Eliquis Code Status: FULL-confirmed with spouse at bedside. Family Communication:  SPouse at bedside Disposition Plan: ~ 2 days Consults called: None  Admission status:  Inpt Med surg I certify that at the point of admission it is my clinical judgment that the patient will require inpatient hospital care spanning beyond 2 midnights from the point of admission due to high intensity of service, high risk for further deterioration and high frequency of surveillance required.   Author: Onnie Boer, MD 06/17/2023 11:06 PM  For on call review [email or web address].

## 2023-06-17 NOTE — Assessment & Plan Note (Signed)
Stable. -Hold 25 mg losartan for now with AKI -Resume metoprolol, tamsulosin for BPH

## 2023-06-17 NOTE — Assessment & Plan Note (Addendum)
-   Resume oral budesonide - He is on Entyvio lesions

## 2023-06-17 NOTE — Assessment & Plan Note (Addendum)
Swelling to bursa area on extensor aspect of right elbow, with mild tenderness, erythema and mild fluctuance.  Full range of motion to right elbow, doubt joint involvement.  Febrile to 100.5.  WBC 9.8.  Normal lactic acid 1.6.  Rules out for sepsis.  - Incision and drainage done by [facility] earlier today, - EDP talked to Dr. Rennis Chris on-call for [facility],recommended giving drainage, no further Ortho care needed, patient can follow-up as outpatient.  -Follow-up with [facility] for culture results - Follow-up blood cultures -IV ceftriaxone 2 g daily, and vancomycin started in ED -He is on budesonide and Entyvio, doubt need for stress dose steroids at this time

## 2023-06-17 NOTE — ED Triage Notes (Signed)
Pt was being treated with doxycycline for bursitis in the right elbow and did not improve so he was sent to ortho today to R/O a septic joint.  Ortho took some fluid off and changed the abx to keflex and told pt to come to ER if he ran a fever.  Pt has had one dose of keflex and had a temperature of 100.1 at home.

## 2023-06-17 NOTE — Assessment & Plan Note (Signed)
RAte controlled and on anticoagulation. -Resume Eliquis, metoprolol

## 2023-06-17 NOTE — Assessment & Plan Note (Signed)
Stable no chest pain

## 2023-06-17 NOTE — Assessment & Plan Note (Addendum)
Blood glucose 431. -1 L bolus given -Resume Tresiba 30 units daily - SSI- M Q4h for now till improvement in blood glucose -Hold metformin and [name] - Hgba1c

## 2023-06-17 NOTE — Assessment & Plan Note (Addendum)
Stable.  Able to answer simple questions but unable to give details.  Lives at home. -Resume Zoloft, donepezil, memantine

## 2023-06-17 NOTE — Assessment & Plan Note (Addendum)
AKI on CKD 3A.  Creatinine 1.9, baseline 1.2-1.5. -Hold losartan 25 mg -1 L LR bolus given, continue N/s 75cc/hr x 15hrs

## 2023-06-17 NOTE — Progress Notes (Signed)
Pharmacy Antibiotic Note  Patrick Miranda is a 77 y.o. male admitted on 06/17/2023 with cellulitis.  Per triage note: Pt was being treated with doxycycline for bursitis in the right elbow and did not improve so he was sent to ortho today to R/O a septic joint. Ortho took some fluid off and changed the abx to keflex and told pt to come to ER if he ran a fever. Pt has had one dose of keflex and had a temperature of 100.1 at home. Pharmacy has been consulted for vancomycin dosing.  Ceftriaxone 1 g IV x 1 ordered Tmax 100.5 WBC 9.8  Plan: Day 1 of antibiotics Give vancomycin 1750 mg IV x1 followed by 1500 mg IV Q36H. Goal AUC 400-550. Expected AUC: 542.9 Expected Css min: 12.7 SCr used: 1.9  Weight used: IBW, Vd used: 0.72 (BMI 25.4) Continue to monitor renal function and follow culture results   Height: 5\' 10"  (177.8 cm) Weight: 80.3 kg (177 lb) IBW/kg (Calculated) : 73  Temp (24hrs), Avg:100.2 F (37.9 C), Min:99.9 F (37.7 C), Max:100.5 F (38.1 C)  Recent Labs  Lab 06/17/23 2005  WBC 9.8  CREATININE 1.90*    Estimated Creatinine Clearance: 33.1 mL/min (A) (by C-G formula based on SCr of 1.9 mg/dL (H)).    Allergies  Allergen Reactions   Atorvastatin Other (See Comments)    Muscle pain   Sitagliptin Other (See Comments)    Myalgias    Antimicrobials this admission: 11/27 Ceftriaxone x 1 11/27 Vanc >>   Dose adjustments this admission: None  Microbiology results: None  Thank you for allowing pharmacy to be a part of this patient's care.  Merryl Hacker, PharmD Clinical Pharmacist 06/17/2023 9:34 PM

## 2023-06-18 DIAGNOSIS — M7110 Other infective bursitis, unspecified site: Secondary | ICD-10-CM | POA: Diagnosis not present

## 2023-06-18 LAB — CBC
HCT: 30.3 % — ABNORMAL LOW (ref 39.0–52.0)
Hemoglobin: 9.3 g/dL — ABNORMAL LOW (ref 13.0–17.0)
MCH: 28.6 pg (ref 26.0–34.0)
MCHC: 30.7 g/dL (ref 30.0–36.0)
MCV: 93.2 fL (ref 80.0–100.0)
Platelets: 222 10*3/uL (ref 150–400)
RBC: 3.25 MIL/uL — ABNORMAL LOW (ref 4.22–5.81)
RDW: 14.2 % (ref 11.5–15.5)
WBC: 10.4 10*3/uL (ref 4.0–10.5)
nRBC: 0 % (ref 0.0–0.2)

## 2023-06-18 LAB — GLUCOSE, CAPILLARY
Glucose-Capillary: 128 mg/dL — ABNORMAL HIGH (ref 70–99)
Glucose-Capillary: 202 mg/dL — ABNORMAL HIGH (ref 70–99)
Glucose-Capillary: 173 mg/dL — ABNORMAL HIGH (ref 70–99)
Glucose-Capillary: 179 mg/dL — ABNORMAL HIGH (ref 70–99)
Glucose-Capillary: 256 mg/dL — ABNORMAL HIGH (ref 70–99)

## 2023-06-18 LAB — BASIC METABOLIC PANEL
Anion gap: 8 (ref 5–15)
BUN: 23 mg/dL (ref 8–23)
CO2: 24 mmol/L (ref 22–32)
Calcium: 8.1 mg/dL — ABNORMAL LOW (ref 8.9–10.3)
Chloride: 108 mmol/L (ref 98–111)
Creatinine, Ser: 1.41 mg/dL — ABNORMAL HIGH (ref 0.61–1.24)
GFR, Estimated: 51 mL/min — ABNORMAL LOW (ref >60)
Glucose, Bld: 108 mg/dL — ABNORMAL HIGH (ref 70–99)
Potassium: 3.6 mmol/L (ref 3.5–5.1)
Sodium: 140 mmol/L (ref 135–145)

## 2023-06-18 LAB — HEMOGLOBIN A1C
Hgb A1c MFr Bld: 8.1 % — ABNORMAL HIGH (ref 4.8–5.6)
Mean Plasma Glucose: 185.77 mg/dL

## 2023-06-18 MED ORDER — VANCOMYCIN HCL 1250 MG/250ML IV SOLN
1250.0000 mg | INTRAVENOUS | Status: DC
  Administered 2023-06-19: 1250 mg via INTRAVENOUS
  Filled 2023-06-18: qty 250

## 2023-06-18 NOTE — Progress Notes (Signed)
   06/18/23 1021  TOC Brief Assessment  Insurance and Status Reviewed  Patient has primary care physician Yes  Home environment has been reviewed home with spouse  Prior level of function: independent  Prior/Current Home Services No current home services  Social Determinants of Health Reivew SDOH reviewed no interventions necessary  Readmission risk has been reviewed Yes  Transition of care needs transition of care needs identified, TOC will continue to follow   Transition of Care Department (TOC) has reviewed patient and no TOC needs have been identified at this time. We will continue to monitor patient advancement through interdisciplinary progression rounds. If new patient transition needs arise, please place a TOC consult.

## 2023-06-18 NOTE — Plan of Care (Signed)
Problem: Education: Goal: Knowledge of General Education information will improve Description: Including pain rating scale, medication(s)/side effects and non-pharmacologic comfort measures Outcome: Progressing   Problem: Health Behavior/Discharge Planning: Goal: Ability to manage health-related needs will improve Outcome: Progressing   Problem: Clinical Measurements: Goal: Ability to maintain clinical measurements within normal limits will improve Outcome: Progressing Goal: Will remain free from infection Outcome: Progressing Goal: Diagnostic test results will improve Outcome: Progressing Goal: Respiratory complications will improve Outcome: Progressing Goal: Cardiovascular complication will be avoided Outcome: Progressing   Problem: Activity: Goal: Risk for activity intolerance will decrease Outcome: Progressing   Problem: Nutrition: Goal: Adequate nutrition will be maintained Outcome: Progressing   Problem: Coping: Goal: Level of anxiety will decrease Outcome: Progressing   Problem: Elimination: Goal: Will not experience complications related to bowel motility Outcome: Progressing Goal: Will not experience complications related to urinary retention Outcome: Progressing   Problem: Pain Management: Goal: General experience of comfort will improve Outcome: Progressing   Problem: Safety: Goal: Ability to remain free from injury will improve Outcome: Progressing   Problem: Skin Integrity: Goal: Risk for impaired skin integrity will decrease Outcome: Progressing   Problem: Education: Goal: Ability to describe self-care measures that may prevent or decrease complications (Diabetes Survival Skills Education) will improve Outcome: Progressing Goal: Individualized Educational Video(s) Outcome: Progressing   Problem: Coping: Goal: Ability to adjust to condition or change in health will improve Outcome: Progressing   Problem: Fluid Volume: Goal: Ability to  maintain a balanced intake and output will improve Outcome: Progressing   Problem: Health Behavior/Discharge Planning: Goal: Ability to identify and utilize available resources and services will improve Outcome: Progressing Goal: Ability to manage health-related needs will improve Outcome: Progressing   Problem: Metabolic: Goal: Ability to maintain appropriate glucose levels will improve Outcome: Progressing   Problem: Nutritional: Goal: Maintenance of adequate nutrition will improve Outcome: Progressing Goal: Progress toward achieving an optimal weight will improve Outcome: Progressing   Problem: Skin Integrity: Goal: Risk for impaired skin integrity will decrease Outcome: Progressing   Problem: Tissue Perfusion: Goal: Adequacy of tissue perfusion will improve Outcome: Progressing   Problem: Clinical Measurements: Goal: Ability to avoid or minimize complications of infection will improve Outcome: Progressing   Problem: Skin Integrity: Goal: Skin integrity will improve Outcome: Progressing

## 2023-06-18 NOTE — Progress Notes (Signed)
PROGRESS NOTE  [employer], is a 77 y.o. male, DOB - 20-Oct-1945, WUJ:[id number]  Admit date - 06/17/2023   Admitting Physician Onnie Boer, MD  Outpatient Primary MD for the patient is Geoffry Paradise, MD  LOS - 1  Chief Complaint  Patient presents with   Joint Swelling       Brief Narrative:   77 y.o. male with medical history significant for dementia, atrial fibrillation, BPH, CKD 3, systolic CHF, CABG, diabetes mellitus, OSA, Crohn's disease admitted on 06/17/2023 with septic right olecranon bursitis     -Assessment and Plan: 1)Rt Olecranon Bursitis with surrounding cellulitis -No further fevers -No leukocytosis - Incision and drainage done by [facility] orthopedic provider on 06/17/2023--cultures apparently sent and pending -- EDP talked to Dr. Rennis Chris on-call for [facility],recommended outpatient Ortho follow-up-Follow-up with Raechel Chute for culture results -Patient received Keflex on 06/16/2023 -Patient received doxycycline monotherapy on 06/17/2023 -Admitted on 06/17/2023 in the evening and started on Rocephin and vancomycin -Continue same pending culture data -PTA he was on budesonide and Entyvio, doubt need for stress dose steroids at this time  2)AKI----acute kidney injury on CKD stage -3A    creatinine on admission= 1.90 ,  baseline creatinine = 1.3 to 1.5   ,  creatinine is now=1.41 -Hold losartan -Patient received IV fluids - renally adjust medications, avoid nephrotoxic agents / dehydration  / hypotension  3)DM2- -A1c 8.4 reflecting uncontrolled DM with Hyperglycemia PTA  Blood glucose 431. -1 L bolus given --c/n Semglee 30 units daily -Use Novolog/Humalog Sliding scale insulin with Accu-Cheks/Fingersticks as ordered  -Hold metformin and [name]  4)Chronic Combined Diastolic and Systolic HF  Stable and compensated.  Last echo 06/2022 EF of 45 to 50% with grade 1 DD.  - Not on diuretics.  5)Atrial fibrillation, chronic --Stable C/n  Eliquis for stroke prophylaxis --c/n metoprolol for rate control  6)Primary Hypertension Stable. -Hold Losartan for now with AKI -Resume Metoprolol, Tamsulosin for BPH  7)Crohn disease (HCC) - Resume oral budesonide - He is on Entyvio lesions  8)Dementia (HCC) Stable.  Able to answer simple questions but unable to give details.  Lives at home with Family. -Resume Zoloft, Donepezil, Memantine  9)Hx of CABG Stable No chest pain. C/n Crestor  No anti-platelets due to Eliquis for afib  Status is: Inpatient   Disposition: The patient is from: Home              Anticipated d/c is to: Home              Anticipated d/c date is: 1 day              Patient currently is not medically stable to d/c. Barriers: Not Clinically Stable-   Code Status :  -  Code Status: Full Code   Family Communication:   Discussed with Daughter Jeanett Schlein and wife [name]  DVT Prophylaxis  :   - SCDs   apixaban (ELIQUIS) tablet 5 mg   Lab Results  Component Value Date   PLT 222 06/18/2023   Inpatient Medications  Scheduled Meds:  apixaban  5 mg Oral BID   budesonide  3 mg Oral Daily   donepezil  10 mg Oral QHS   insulin aspart  0-15 Units Subcutaneous Q4H   insulin glargine-yfgn  30 Units Subcutaneous Daily   levothyroxine  50 mcg Oral Q0600   memantine  10 mg Oral BID   rosuvastatin  20 mg Oral Daily   sertraline  25 mg Oral Daily  tamsulosin  0.4 mg Oral Daily   Continuous Infusions:  cefTRIAXone (ROCEPHIN)  IV 2 g (06/18/23 0936)   [START ON 06/19/2023] vancomycin     PRN Meds:.acetaminophen **OR** acetaminophen, ondansetron **OR** ondansetron (ZOFRAN) IV, polyethylene glycol   Anti-infectives (From admission, onward)    Start     Dose/Rate Route Frequency Ordered Stop   06/19/23 1030  vancomycin (VANCOREADY) IVPB 1500 mg/300 mL  Status:  Discontinued       Placed in "Followed by" Linked Group   1,500 mg 150 mL/hr over 120 Minutes Intravenous Every 36 hours 06/17/23 2201  06/18/23 0856   06/19/23 0000  vancomycin (VANCOREADY) IVPB 1250 mg/250 mL       Placed in "Followed by" Linked Group   1,250 mg 166.7 mL/hr over 90 Minutes Intravenous Every 24 hours 06/18/23 0856     06/18/23 0800  cefTRIAXone (ROCEPHIN) 2 g in sodium chloride 0.9 % 100 mL IVPB        2 g 200 mL/hr over 30 Minutes Intravenous Every 24 hours 06/17/23 2311 06/25/23 0759   06/17/23 2230  vancomycin (VANCOREADY) IVPB 1750 mg/350 mL       Placed in "Followed by" Linked Group   1,750 mg 175 mL/hr over 120 Minutes Intravenous  Once 06/17/23 2201 06/18/23 0226   06/17/23 2145  cefTRIAXone (ROCEPHIN) 1 g in sodium chloride 0.9 % 100 mL IVPB        1 g 200 mL/hr over 30 Minutes Intravenous  Once 06/17/23 2132 06/17/23 2253       Subjective: [employer] today has no fevers, no emesis,  No chest pain,     Discussed with Daughter Jeanett Schlein and wife [name]  No further fevers  -- C/o Rt Elbow area pain  Objective: Vitals:   06/17/23 2310 06/18/23 0401 06/18/23 0752 06/18/23 1212  BP: 137/71 91/66 (!) 106/51 (!) 111/53  Pulse: 63 60 75 66  Resp: 16 16 17 16   Temp: 98.4 F (36.9 C) 98.2 F (36.8 C) 98.8 F (37.1 C) 98.9 F (37.2 C)  TempSrc: Oral Oral Oral Oral  SpO2: 97% 95% 96% 97%  Weight: 79.7 kg     Height:        Intake/Output Summary (Last 24 hours) at 06/18/2023 1647 Last data filed at 06/18/2023 1610 Gross per 24 hour  Intake 2998.31 ml  Output 600 ml  Net 2398.31 ml   Filed Weights   06/17/23 1921 06/17/23 2310  Weight: 80.3 kg 79.7 kg    Physical Exam  Gen:- Awake Alert,  in no apparent distress  HEENT:- [place].AT, No sclera icterus Ears- HOH Neck-Supple Neck,No JVD,.  Lungs-  CTAB , fair symmetrical air movement CV- S1, S2 normal, regular  Abd-  +ve B.Sounds, Abd Soft, No tenderness,    Extremity/Skin:- No  edema, pedal pulses present  Psych-affect is appropriate, oriented x3 Neuro-no new focal deficits, no tremors MSK----Rt Elbow area swelling,  warmt, redness and tenderness -  Media Information  Document Information  Photos  Rt Elbow  06/18/2023 10:08  Attached To:  Hospital Encounter on 06/17/23  Source Information  Shon Hale, MD  Ap-Dept 300  Document History    Data Reviewed: I have personally reviewed following labs and imaging studies  CBC: Recent Labs  Lab 06/17/23 2005 06/18/23 0429  WBC 9.8 10.4  NEUTROABS 7.6  --   HGB 10.4* 9.3*  HCT 33.5* 30.3*  MCV 93.1 93.2  PLT 246 222   Basic Metabolic Panel: Recent Labs  Lab 06/17/23 2005 06/18/23 0429  NA 133* 140  K 4.2 3.6  CL 105 108  CO2 23 24  GLUCOSE 431* 108*  BUN 26* 23  CREATININE 1.90* 1.41*  CALCIUM 8.5* 8.1*   GFR: Estimated Creatinine Clearance: 44.6 mL/min (A) (by C-G formula based on SCr of 1.41 mg/dL (H)). Liver Function Tests: Recent Labs  Lab 06/17/23 2005  AST 23  ALT 12  ALKPHOS 46  BILITOT 0.4  PROT 6.0*  ALBUMIN 3.0*   HbA1C: Recent Labs    06/17/23 2005  HGBA1C 8.1*   Recent Results (from the past 240 hour(s))  Culture, blood (routine x 2)     Status: None (Preliminary result)   Collection Time: 06/17/23  9:52 PM   Specimen: BLOOD  Result Value Ref Range Status   Specimen Description BLOOD BLOOD RIGHT ARM  Final   Special Requests   Final    BOTTLES DRAWN AEROBIC AND ANAEROBIC Blood Culture adequate volume   Culture   Final    NO GROWTH < 12 HOURS Performed at [facility], [address], [place], [place] [address]    Report Status PENDING  Incomplete  Culture, blood (routine x 2)     Status: None (Preliminary result)   Collection Time: 06/17/23  9:55 PM   Specimen: BLOOD  Result Value Ref Range Status   Specimen Description BLOOD BLOOD LEFT WRIST  Final   Special Requests   Final    BOTTLES DRAWN AEROBIC AND ANAEROBIC Blood Culture adequate volume   Culture   Final    NO GROWTH < 12 HOURS Performed at [facility], [address], [place], [place] [address]    Report Status PENDING   Incomplete     Scheduled Meds:  apixaban  5 mg Oral BID   budesonide  3 mg Oral Daily   donepezil  10 mg Oral QHS   insulin aspart  0-15 Units Subcutaneous Q4H   insulin glargine-yfgn  30 Units Subcutaneous Daily   levothyroxine  50 mcg Oral Q0600   memantine  10 mg Oral BID   rosuvastatin  20 mg Oral Daily   sertraline  25 mg Oral Daily   tamsulosin  0.4 mg Oral Daily   Continuous Infusions:  cefTRIAXone (ROCEPHIN)  IV 2 g (06/18/23 0936)   [START ON 06/19/2023] vancomycin      LOS: 1 day   Shon Hale M.D on 06/18/2023 at 4:47 PM  Go to [email or web address] - for contact info  [facility] - Office  [phone]  If 7PM-7AM, please contact night-coverage [email or web address] 06/18/2023, 4:47 PM

## 2023-06-19 DIAGNOSIS — M7110 Other infective bursitis, unspecified site: Secondary | ICD-10-CM | POA: Diagnosis not present

## 2023-06-19 LAB — GLUCOSE, CAPILLARY
Glucose-Capillary: 130 mg/dL — ABNORMAL HIGH (ref 70–99)
Glucose-Capillary: 147 mg/dL — ABNORMAL HIGH (ref 70–99)
Glucose-Capillary: 222 mg/dL — ABNORMAL HIGH (ref 70–99)
Glucose-Capillary: 59 mg/dL — ABNORMAL LOW (ref 70–99)
Glucose-Capillary: 85 mg/dL (ref 70–99)

## 2023-06-19 LAB — CREATININE, SERUM
Creatinine, Ser: 1.58 mg/dL — ABNORMAL HIGH (ref 0.61–1.24)
GFR, Estimated: 44 mL/min — ABNORMAL LOW (ref >60)

## 2023-06-19 MED ORDER — ACETAMINOPHEN 325 MG PO TABS: 650.0000 mg | ORAL_TABLET | Freq: Four times a day (QID) | ORAL | Status: DC | PRN

## 2023-06-19 NOTE — Discharge Summary (Signed)
Patrick Miranda, is a 77 y.o. male  DOB 01/11/1946  MRN [id number].  Admission date:  06/17/2023  Admitting Physician  Onnie Boer, MD  Discharge Date:  06/19/2023   Primary MD  Geoffry Paradise, MD  Recommendations for primary care physician for things to follow:  1)Please take Cephalexin and Doxycycline antibiotics as prescribed 2)Please call [facility] clinic on Monday, 06/22/2023 to get the results of right elbow aspiration fluid cultures 3)Avoid ibuprofen/Advil/Aleve/Motrin/Goody Powders/Naproxen/BC powders/Meloxicam/Diclofenac/Indomethacin and other Nonsteroidal anti-inflammatory medications as these will make you more likely to bleed and can cause stomach ulcers, can also cause Kidney problems.   Admission Diagnosis  Septic bursitis [M71.10] AKI (acute kidney injury) (HCC) [N17.9]   Discharge Diagnosis  Septic bursitis [M71.10] AKI (acute kidney injury) (HCC) [N17.9]    Principal Problem:   Septic bursitis with surrounding cellulitis Active Problems:   Type 2 diabetes mellitus with hyperglycemia, with long-term current use of insulin (HCC)   Acute kidney injury superimposed on CKD (HCC)   Atrial fibrillation, chronic (HCC)   Chronic systolic HF (heart failure) (HCC)   Primary hypertension   OSA on CPAP   Hx of CABG   Dementia (HCC)   Crohn disease (HCC)      Past Medical History:  Diagnosis Date   Arthritis    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 s/p CABG in 06/2020 with LIMA-LAD, SVG-D1 and SVG-RCA   COVID-07 Dec 2019   Dementia [facility])    Depression    Essential hypertension    Hernia, umbilical    HOH (hard of hearing)    Hyperlipidemia    Ischemic cardiomyopathy    Ischemic necrosis of small bowel (HCC)    Left ventricular mural thrombus    NSTEMI (non-ST elevated myocardial infarction) (HCC)    OSA  on CPAP    Pulmonary emboli [facility])    May 2021   Skin cancer    Type 2 diabetes mellitus (HCC)     Past Surgical History:  Procedure Laterality Date   COLON SURGERY     COLONOSCOPY W/ POLYPECTOMY     CORONARY ARTERY BYPASS GRAFT N/A 07/09/2020   Procedure: CORONARY ARTERY BYPASS GRAFTING (CABG) TIMES THREE , USING LEFT INTERNAL MAMMARY ARTERY AND RIGHT LEG GREATER SAPHENOUS VEIN HARVESTED ENDOSCOPICALLY;  Surgeon: Kerin Perna, MD;  Location: [facility] OR;  Service: Open Heart Surgery;  Laterality: N/A;   LEFT HEART CATH AND CORONARY ANGIOGRAPHY N/A 07/04/2020   Procedure: LEFT HEART CATH AND CORONARY ANGIOGRAPHY;  Surgeon: Lennette Bihari, MD;  Location: [facility] INVASIVE CV LAB;  Service: Cardiovascular;  Laterality: N/A;   TEE WITHOUT CARDIOVERSION N/A 07/09/2020   Procedure: TRANSESOPHAGEAL ECHOCARDIOGRAM (TEE);  Surgeon: Donata Clay, Theron Arista, MD;  Location: [facility] OR;  Service: Open Heart Surgery;  Laterality: N/A;   TOTAL KNEE ARTHROPLASTY Left 04/22/2013   Procedure: TOTAL KNEE ARTHROPLASTY- left;  Surgeon: Verlee Rossetti, MD;  Location: [facility] OR;  Service: Orthopedics;  Laterality: Left;  with femoral block   TOTAL KNEE ARTHROPLASTY Right 01/06/2014   Procedure: RIGHT TOTAL KNEE ARTHROPLASTY;  Surgeon: Verlee Rossetti, MD;  Location: [facility] OR;  Service: Orthopedics;  Laterality: Right;   TRANSURETHRAL RESECTION OF PROSTATE N/A 05/14/2021   Procedure: TRANSURETHRAL RESECTION OF THE PROSTATE (TURP);  Surgeon: Noel Christmas, MD;  Location: [facility] ORS;  Service: Urology;  Laterality: N/A;  90 MINS       HPI  from the history and physical done on the day of admission:   Chief Complaint: Right elbow pain and swelling   HPI: Patrick Miranda is a 77 y.o. male with medical history significant for dementia, atrial fibrillation, BPH, CKD 3, systolic CHF, CABG, diabetes mellitus, OSA, Crohn's disease. On my evaluation patient is awake and alert, able to answer simple questions, but spouse provides most of  the history due to patient's baseline dementia. Patient presented to the ED with complaints of right elbow pain and swelling that started 2 days ago.  He went to the urgent care yesterday and was started on a course of doxycycline, and follow-up was arranged with his orthopedist with [facility] for today.  He had incision and drainage, and was started on a course of cephalexin.  Spouse reports drainage of greenish-brown fluid from patient's elbow, sample was sent for culture.  Told to go to the ED if patient spiked a fever.  Patient had a fever of 100.1 at home hence presentation to ER.   ED Course: Tmax 100.5.  Heart rate 57-78.  Respirate rate 16.  Blood pressure systolic 120s to 536U.  WBC 9.8.  Creatinine elevated at 1.9. EDP talked to Dr. Rennis Chris on-call for [facility] recommended giving drainage, no further Ortho care needed, patient can follow-up as outpatient.     Review of Systems: As per HPI all other systems reviewed and negative.   Hospital Course:     Brief Narrative:   77 y.o. male with medical history significant for dementia, atrial fibrillation, BPH, CKD 3, systolic CHF, CABG, diabetes mellitus, OSA, Crohn's disease admitted on 06/17/2023 with septic right olecranon bursitis      -Assessment and Plan: 1)Rt Olecranon Bursitis with surrounding cellulitis -No further fevers -No leukocytosis -Rt Olecranon bursa aspiration done by [facility] orthopedic provider on 06/17/2023--cultures apparently sent and pending -- EDP talked to Dr. Rennis Chris on-call for [facility],recommended outpatient Ortho follow-up-Follow-up with Raechel Chute for culture results -Patient received Keflex on 06/16/2023 -Patient received doxycycline monotherapy on 06/17/2023 -Admitted on 06/17/2023 in the evening and treated with Rocephin and vancomycin -Okay to discharge on combination of doxycycline and Keflex -Follow-up with orthopedic surgeon on Monday 12-20 24 to get results of fluid culture that was  aspirated from his Rt olecranon bursa -PTA he was on budesonide and Entyvio, doubt need for stress dose steroids at this time   2)AKI----acute kidney injury on CKD stage -3A    creatinine on admission= 1.90 ,  baseline creatinine = 1.3 to 1.5   ,  creatinine is now=1.41---improved with IV fluids - renally adjust medications, avoid nephrotoxic agents / dehydration  / hypotension   3)DM2- -A1c 8.4 reflecting uncontrolled DM with Hyperglycemia PTA  -Suspect hyperglycemia worsening due to infection -Resume PTA diabetic regimen   4)Chronic Combined Diastolic and Systolic HF  Stable and compensated.  Last echo 06/2022 EF of 45 to 50% with grade 1 DD.  -Continue losartan   5)Atrial fibrillation, chronic --Stable C/n Eliquis for stroke prophylaxis -   6)Primary Hypertension  Stable. Continue losartan - Tamsulosin for BPH   7)Crohn disease (HCC) - Resume oral budesonide - He is on Entyvio lesions   8)Dementia (HCC) Stable cognitive and memory deficits at baseline - lives at home with Family. -Resume Zoloft, Donepezil, Memantine   9)Hx of CABG Stable No chest pain. C/n Crestor  No anti-platelets due to Eliquis for afib    10) chronic anemia--normochromic and normocytic suspect anemia of chronic disease in the setting of Crohn's colitis and CKD  -Hgb stable , no bleeding concerns follow-up with PCP   Disposition: The patient is from: Home              Anticipated d/c is to: Home  Discharge Condition: stable  Follow UP   Follow-up Information     Francena Hanly, MD. Schedule an appointment as soon as possible for a visit in 4 day(s).   Specialty: Orthopedic Surgery Why: wound culture results Contact information: [address] [place] [address] [phone]                 Consults obtained - ortho  Diet and Activity recommendation:  As advised  Discharge Instructions    Discharge Instructions     Call MD for:  difficulty breathing,  headache or visual disturbances   Complete by: As directed    Call MD for:  persistant dizziness or light-headedness   Complete by: As directed    Call MD for:  persistant nausea and vomiting   Complete by: As directed    Call MD for:  redness, tenderness, or signs of infection (pain, swelling, redness, odor or green/yellow discharge around incision site)   Complete by: As directed    Call MD for:  severe uncontrolled pain   Complete by: As directed    Call MD for:  temperature >100.4   Complete by: As directed    Diet - low sodium heart healthy   Complete by: As directed    Diet Carb Modified   Complete by: As directed    Discharge instructions   Complete by: As directed    1)Please take Cephalexin and Doxycycline antibiotics as prescribed 2)Please call [facility] clinic on Monday, 06/22/2023 to get the results of right elbow aspiration fluid cultures 3)Avoid ibuprofen/Advil/Aleve/Motrin/Goody Powders/Naproxen/BC powders/Meloxicam/Diclofenac/Indomethacin and other Nonsteroidal anti-inflammatory medications as these will make you more likely to bleed and can cause stomach ulcers, can also cause Kidney problems.   Increase activity slowly   Complete by: As directed       Discharge Medications     Allergies as of 06/19/2023       Reactions   Atorvastatin Other (See Comments)   Muscle pain   Sitagliptin Other (See Comments)   Myalgias        Medication List     TAKE these medications    acetaminophen 325 MG tablet Commonly known as: TYLENOL Take 2 tablets (650 mg total) by mouth every 6 (six) hours as needed for mild pain (pain score 1-3) (or Fever >/= 101).   acidophilus Caps capsule Take 1 capsule by mouth daily.   apixaban 5 MG Tabs tablet Commonly known as: Eliquis TAKE 1 TABLET(5 MG) BY MOUTH TWICE DAILY   BD Pen Needle Nano U/F 32G X 4 MM Misc Generic drug: Insulin Pen Needle 1 each by Other route daily.   BIOTIN PO Take 5,000 mcg by mouth daily.    budesonide 3 MG 24 hr capsule Commonly known as: ENTOCORT EC Take 3 mg  by mouth daily.   cephALEXin 500 MG capsule Commonly known as: KEFLEX Take 500 mg by mouth 3 (three) times daily.   Cyanocobalamin 2500 MCG Tabs Take 2,500 mcg by mouth daily.   donepezil 10 MG tablet Commonly known as: ARICEPT Take 1 tablet (10 mg total) by mouth at bedtime.   doxycycline 100 MG capsule Commonly known as: VIBRAMYCIN Take 100 mg by mouth 2 (two) times daily.   ENTYVIO IV Inject into the vein every 28 (twenty-eight) days.   fenofibrate 160 MG tablet 1 capsule with a meal Orally Once a day   FreeStyle Libre 2 Sensor Misc See admin instructions.   [name] 10 MG Tabs tablet Generic drug: empagliflozin TAKE 1 TABLET(10 MG) BY MOUTH DAILY BEFORE BREAKFAST   levothyroxine 50 MCG tablet Commonly known as: SYNTHROID Take 50 mcg by mouth every morning.   losartan 25 MG tablet Commonly known as: COZAAR TAKE 1/2 TABLET(12.5 MG) BY MOUTH DAILY   memantine 10 MG tablet Commonly known as: NAMENDA Take 1 tablet (10 mg total) by mouth 2 (two) times daily.   metFORMIN 500 MG tablet Commonly known as: GLUCOPHAGE Take 500 mg by mouth 2 (two) times daily with a meal.   multivitamin with minerals Tabs tablet Take 1 tablet by mouth daily.   NOVOLOG FLEXPEN  Inject 8-10 Units into the skin 3 (three) times daily before meals. Dose per sliding scale   rosuvastatin 20 MG tablet Commonly known as: CRESTOR TAKE 1 TABLET(20 MG) BY MOUTH DAILY   sertraline 25 MG tablet Commonly known as: ZOLOFT Take 1 tablet (25 mg total) by mouth daily.   tamsulosin 0.4 MG Caps capsule Commonly known as: FLOMAX Take 0.4 mg by mouth daily.   Evaristo Bury FlexTouch 100 UNIT/ML FlexTouch Pen Generic drug: insulin degludec Inject 32 Units into the skin daily.       Major procedures and Radiology Reports - PLEASE review detailed and final reports for all details, in brief -   Recent Results (from the past  240 hour(s))  Culture, blood (routine x 2)     Status: None (Preliminary result)   Collection Time: 06/17/23  9:52 PM   Specimen: BLOOD  Result Value Ref Range Status   Specimen Description BLOOD BLOOD RIGHT ARM  Final   Special Requests   Final    BOTTLES DRAWN AEROBIC AND ANAEROBIC Blood Culture adequate volume   Culture   Final    NO GROWTH 2 DAYS Performed at [facility], [address], [place], [place] [address]    Report Status PENDING  Incomplete  Culture, blood (routine x 2)     Status: None (Preliminary result)   Collection Time: 06/17/23  9:55 PM   Specimen: BLOOD  Result Value Ref Range Status   Specimen Description BLOOD BLOOD LEFT WRIST  Final   Special Requests   Final    BOTTLES DRAWN AEROBIC AND ANAEROBIC Blood Culture adequate volume   Culture   Final    NO GROWTH 2 DAYS Performed at [facility], [address], [place], [place] [address]    Report Status PENDING  Incomplete   Today   Subjective    Patrick Miranda today has no new complaints  -Wife at bedside, questions answered       Patient has been seen and examined prior to discharge   Objective   Blood pressure (!) 121/51, pulse 66, temperature (!) 97.5 F (36.4 C), temperature source Oral, resp. rate 16, height 5\' 10"  (1.778 m), weight 79.7  kg, SpO2 99%.   Intake/Output Summary (Last 24 hours) at 06/19/2023 1042 Last data filed at 06/19/2023 0320 Gross per 24 hour  Intake 1401.25 ml  Output 150 ml  Net 1251.25 ml    Exam Gen:- Awake Alert, no acute distress  HEENT:- [place].AT, No sclera icterus Ears----HOH Neck-Supple Neck,No JVD,.  Lungs-  CTAB , good air movement bilaterally CV- S1, S2 normal, regular Abd-  +ve B.Sounds, Abd Soft, No tenderness,    Psych-affect is appropriate, baseline cognitive and memory deficits consistent with underlying dementia Neuro-no new focal deficits, no tremors  MSK----Much improved Rt Elbow area swelling, warmt, redness and tenderness    Data Review    CBC w Diff:  Lab Results  Component Value Date   WBC 10.4 06/18/2023   HGB 9.3 (L) 06/18/2023   HGB 12.0 (L) 09/25/2022   HCT 30.3 (L) 06/18/2023   HCT 37.2 (L) 09/25/2022   PLT 222 06/18/2023   PLT 283 09/25/2022   LYMPHOPCT 13 06/17/2023   MONOPCT 8 06/17/2023   EOSPCT 0 06/17/2023   BASOPCT 0 06/17/2023    CMP:  Lab Results  Component Value Date   NA 140 06/18/2023   K 3.6 06/18/2023   CL 108 06/18/2023   CO2 24 06/18/2023   BUN 23 06/18/2023   CREATININE 1.58 (H) 06/19/2023   PROT 6.0 (L) 06/17/2023   ALBUMIN 3.0 (L) 06/17/2023   BILITOT 0.4 06/17/2023   ALKPHOS 46 06/17/2023   AST 23 06/17/2023   ALT 12 06/17/2023  .  Total Discharge time is about 33 minutes  Shon Hale M.D on 06/19/2023 at 10:42 AM  Go to [email or web address] -  for contact info  [facility] - Office  [phone]

## 2023-06-19 NOTE — Plan of Care (Signed)
  Problem: Education: Goal: Knowledge of General Education information will improve Description: Including pain rating scale, medication(s)/side effects and non-pharmacologic comfort measures Outcome: Progressing   Problem: Health Behavior/Discharge Planning: Goal: Ability to manage health-related needs will improve Outcome: Progressing   Problem: Clinical Measurements: Goal: Ability to maintain clinical measurements within normal limits will improve Outcome: Progressing Goal: Will remain free from infection Outcome: Progressing Goal: Diagnostic test results will improve Outcome: Progressing Goal: Respiratory complications will improve Outcome: Progressing Goal: Cardiovascular complication will be avoided Outcome: Progressing   Problem: Pain Management: Goal: General experience of comfort will improve Outcome: Progressing   Problem: Safety: Goal: Ability to remain free from injury will improve Outcome: Progressing   Problem: Skin Integrity: Goal: Risk for impaired skin integrity will decrease Outcome: Progressing

## 2023-06-19 NOTE — Discharge Instructions (Signed)
1)Please take Cephalexin and Doxycycline antibiotics as prescribed 2)Please call [facility] on Monday, 06/22/2023 to get the results of right elbow aspiration fluid cultures 3)Avoid ibuprofen/Advil/Aleve/Motrin/Goody Powders/Naproxen/BC powders/Meloxicam/Diclofenac/Indomethacin and other Nonsteroidal anti-inflammatory medications as these will make you more likely to bleed and can cause stomach ulcers, can also cause Kidney problems.

## 2023-06-22 ENCOUNTER — Ambulatory Visit: Payer: Medicare HMO | Admitting: Podiatry

## 2023-06-22 ENCOUNTER — Encounter: Payer: Self-pay | Admitting: Podiatry

## 2023-06-22 DIAGNOSIS — M79676 Pain in unspecified toe(s): Secondary | ICD-10-CM | POA: Diagnosis not present

## 2023-06-22 DIAGNOSIS — E119 Type 2 diabetes mellitus without complications: Secondary | ICD-10-CM | POA: Diagnosis not present

## 2023-06-22 DIAGNOSIS — D689 Coagulation defect, unspecified: Secondary | ICD-10-CM

## 2023-06-22 DIAGNOSIS — B351 Tinea unguium: Secondary | ICD-10-CM

## 2023-06-22 LAB — CULTURE, BLOOD (ROUTINE X 2)
Culture: NO GROWTH
Culture: NO GROWTH
Special Requests: ADEQUATE
Special Requests: ADEQUATE

## 2023-06-22 NOTE — Progress Notes (Signed)
This patient returns to my office for at risk foot care.  This patient requires this care by a professional since this patient will be at risk due to having diabetes and CKD.and coagulation defect..  Patient is taking plavix and eliquis causing coagulation defect. This patient is unable to cut nails himself since the patient cannot reach his nails.These nails are painful walking and wearing shoes.  This patient presents for at risk foot care today.  General Appearance  Alert, conversant and in no acute stress.  Vascular  Dorsalis pedis and posterior tibial  pulses are palpable  bilaterally.  Capillary return is within normal limits  bilaterally. Temperature is within normal limits  bilaterally.  Neurologic  Senn-Weinstein monofilament wire test within normal limits  bilaterally. Muscle power within normal limits bilaterally.  Nails Thick disfigured discolored nails with subungual debris  hallux nails  bilaterally. No evidence of bacterial infection or drainage bilaterally.  Asymptomatic left hallux nail.  Orthopedic  No limitations of motion  feet .  No crepitus or effusions noted.  HAV  B/L.  Overlapping second toe left foot.  Skin  normotropic skin with no porokeratosis noted bilaterally.  No signs of infections or ulcers noted.   Asymptomatic pinch callus.  Onychomycosis  Pain in right toes  Pain in left toes  Consent was obtained for treatment procedures.   Mechanical debridement of nails 1-5  bilaterally performed with a nail nipper.  Filed with dremel without incident. Remove dead skin left hallux from healed blister.   Return office visit 3  months                  Told patient to return for periodic foot care and evaluation due to potential at risk complications.   Helane Gunther DPM

## 2023-06-25 DIAGNOSIS — M71121 Other infective bursitis, right elbow: Secondary | ICD-10-CM | POA: Diagnosis not present

## 2023-07-01 DIAGNOSIS — M7021 Olecranon bursitis, right elbow: Secondary | ICD-10-CM | POA: Diagnosis not present

## 2023-07-20 DIAGNOSIS — K529 Noninfective gastroenteritis and colitis, unspecified: Secondary | ICD-10-CM | POA: Diagnosis not present

## 2023-07-24 DIAGNOSIS — K529 Noninfective gastroenteritis and colitis, unspecified: Secondary | ICD-10-CM | POA: Diagnosis not present

## 2023-07-30 ENCOUNTER — Telehealth: Payer: Self-pay | Admitting: Pharmacy Technician

## 2023-07-30 DIAGNOSIS — N1831 Chronic kidney disease, stage 3a: Secondary | ICD-10-CM | POA: Diagnosis not present

## 2023-07-30 DIAGNOSIS — E1129 Type 2 diabetes mellitus with other diabetic kidney complication: Secondary | ICD-10-CM | POA: Diagnosis not present

## 2023-07-30 DIAGNOSIS — I129 Hypertensive chronic kidney disease with stage 1 through stage 4 chronic kidney disease, or unspecified chronic kidney disease: Secondary | ICD-10-CM | POA: Diagnosis not present

## 2023-07-30 DIAGNOSIS — E785 Hyperlipidemia, unspecified: Secondary | ICD-10-CM | POA: Diagnosis not present

## 2023-07-30 DIAGNOSIS — Z5986 Financial insecurity: Secondary | ICD-10-CM

## 2023-07-30 DIAGNOSIS — Z794 Long term (current) use of insulin: Secondary | ICD-10-CM | POA: Diagnosis not present

## 2023-07-30 NOTE — Progress Notes (Signed)
 Pharmacy Medication Assistance Program Note    07/30/2023  Patient ID: Patrick Miranda, male   DOB: [birth year], 78 y.o.   MRN: [id number]     07/30/2023  Outreach Medication One  Initial Outreach Date (Medication One) 05/08/2023  Manufacturer Medication One [employer] [facility] Novolog   Dose of Novolog  FlexPen 100 units/ml  Type of Radiographer, Therapeutic Assistance  Date Application Sent to Patient 05/13/2023  Application Items Requested Application;Proof of Income;Other  Date Application Sent to Prescriber 05/13/2023  Name of Prescriber Charlie Love  Date Application Received From Patient 07/29/2023  Application Items Received From Patient Application;Proof of Income;Other  Date Application Received From Provider 06/01/2023  Date Application Submitted to Manufacturer 07/29/2023  Method Application Sent to Manufacturer Fax        07/30/2023  Outreach Medication Two  Initial Outreach Date (Medication Two) 05/08/2023  Manufacturer Medication Two Novo Nordisk  [facility] Tresiba  Dose of Tresiba FlexTouch 200 units/ml  Type of Radiographer, Therapeutic Assistance  Date Application Sent to Patient 05/13/2023  Application Items Requested Application;Proof of Income;Other  Date Application Sent to Prescriber 05/13/2023  Name of Prescriber Charlie Love  Date Application Received From Patient 07/29/2023  Application Items Received From Patient Application;Proof of Income;Other  Date Application Received From Provider 06/01/2023  Method Application Sent to Manufacturer Fax  Date Application Submitted to Manufacturer 07/29/2023          07/30/2023  Outreach Medication Three  Initial Outreach Date (Medication Three) 05/08/2023  Manufacturer Medication Three [employer]gel[employer]  [employer] [facility] [name]   Dose of [name]  10mg   Type of Radiographer, Therapeutic Assistance  Date Application Sent to Patient 05/13/2023  Application Items Requested  Application;Proof of Income;Other  Date Application Sent to Prescriber 05/13/2023  Name of Prescriber Jayson Sierras  Date Application Received From Patient 07/29/2023  Application Items Received From Patient Application;Proof of Income;Other  Date Application Received From Provider 06/11/2023  Date Application Submitted to Manufacturer 07/29/2023  Method Application Sent to Manufacturer Fax     SIGNATURE  Kate Caddy, CPhT [place]  Office: [phone] Fax: [phone] Email: [email or web address]

## 2023-08-03 ENCOUNTER — Other Ambulatory Visit: Payer: Self-pay | Admitting: *Deleted

## 2023-08-03 DIAGNOSIS — F01A0 Vascular dementia, mild, without behavioral disturbance, psychotic disturbance, mood disturbance, and anxiety: Secondary | ICD-10-CM

## 2023-08-03 MED ORDER — DONEPEZIL HCL 10 MG PO TABS: 10.0000 mg | ORAL_TABLET | Freq: Every day | ORAL | 3 refills | Status: DC

## 2023-08-03 NOTE — Telephone Encounter (Signed)
 Last seen on 04/14/23 Follow up scheduled 11/03/22

## 2023-08-06 ENCOUNTER — Telehealth: Payer: Self-pay | Admitting: Pharmacy Technician

## 2023-08-06 DIAGNOSIS — Z5986 Financial insecurity: Secondary | ICD-10-CM

## 2023-08-06 NOTE — Progress Notes (Signed)
Pharmacy Medication Assistance Program Note    08/06/2023  Patient ID: Patrick Miranda, male   DOB: 1945/10/01, 78 y.o.   MRN: [id number]     07/30/2023 08/06/2023  Outreach Medication One  Initial Outreach Date (Medication One) 05/08/2023   Manufacturer Medication One [employer] [facility] Novolog   Dose of Novolog FlexPen 100 units/ml   Type of Radiographer, therapeutic Assistance   Date Application Sent to Patient 05/13/2023   Application Items Requested Application;Proof of Income;Other   Date Application Sent to Prescriber 05/13/2023   Name of Prescriber Patrick Miranda   Date Application Received From Patient 07/29/2023   Application Items Received From Patient Application;Proof of Income;Other   Date Application Received From Provider 06/01/2023   Date Application Submitted to Manufacturer 07/29/2023   Method Application Sent to Manufacturer Fax   Patient Assistance Determination  Approved  Approval Start Date  07/30/2023  Approval End Date  07/20/2024  Patient Notification Method  Telephone Call  Telephone Call Outcome  Successful         07/30/2023 08/06/2023  Outreach Medication Two  Initial Outreach Date (Medication Two) 05/08/2023   Manufacturer Medication Two Novo Nordisk   Nordisk [facility] Tresiba   Dose of Tresiba FlexTouch 200 units/ml   Type of Radiographer, therapeutic Assistance   Date Application Sent to Patient 05/13/2023   Application Items Requested Application;Proof of Income;Other   Date Application Sent to Prescriber 05/13/2023   Name of Prescriber Patrick Miranda   Date Application Received From Patient 07/29/2023   Application Items Received From Patient Application;Proof of Income;Other   Date Application Received From Provider 06/01/2023   Method Application Sent to Manufacturer Fax   Date Application Submitted to Manufacturer 07/29/2023   Patient Assistance Determination  Approved  Approval Start Date  07/30/2023  Patient Notification Method   Telephone Call  Telephone Call Outcome  Successful   Successful     Multiple values from one day are sorted in reverse-chronological order        07/30/2023 08/06/2023  Outreach Medication Three  Initial Outreach Date (Medication Three) 05/08/2023   Manufacturer Medication Three [employer]gel[employer]   [employer] Drugs [name]   Dose of [name] 10mg    Type of Radiographer, therapeutic Assistance   Date Application Sent to Patient 05/13/2023   Application Items Requested Application;Proof of Income;Other   Date Application Sent to Prescriber 05/13/2023   Name of Prescriber Patrick Miranda   Date Application Received From Patient 07/29/2023   Application Items Received From Patient Application;Proof of Income;Other   Date Application Received From Provider 06/11/2023   Date Application Submitted to Manufacturer 07/29/2023   Method Application Sent to Manufacturer Fax   Patient Assistance Determination  Approved  Approval Start Date  08/06/2023  Approval End Date  07/20/2024  Patient Notification Method  Telephone Call    Two care coordination calls placed to [employer] in regard to [place] and Novolog application and to [employer] in regard to [place] application.  Spoke to [employer] at [employer] and patient is APPROVED 07/30/23-07/20/24 for [place] and [employer]. She informs the medications, and subsequent refills will process, and ship automatically based on last fill date in 2024 and going forward. Patient may call Novo Nordisk at any time to check shipment dates by calling [phone].  Spoke to Rhodell at [facility] and he informs patient is APPROVED 08/06/23-07/20/24 for [name]. He informs order has not been placed but typically patients receive it in 7-10 business days. Another care coordination call placed  to [employer] to check on order. Spoke to [place] who informs she placed the order today and patient should have in about 5-7 business days to his home. She informs patient will have to call  [employer] for refills by dialing [phone].  Successful outreach to patient's wife Patrick Miranda. HIPAA verified. Informed Patrick Miranda of patient's approvals, refill procedure and expected dates of initial deliveries.   Patrick Miranda, CPhT [place]  Office: [phone] Fax: [phone] Email: [email or web address]

## 2023-08-13 DIAGNOSIS — K50819 Crohn's disease of both small and large intestine with unspecified complications: Secondary | ICD-10-CM | POA: Diagnosis not present

## 2023-08-16 DIAGNOSIS — E1129 Type 2 diabetes mellitus with other diabetic kidney complication: Secondary | ICD-10-CM | POA: Diagnosis not present

## 2023-08-17 ENCOUNTER — Other Ambulatory Visit: Payer: Self-pay | Admitting: Neurology

## 2023-08-17 ENCOUNTER — Other Ambulatory Visit: Payer: Self-pay | Admitting: Cardiology

## 2023-08-17 NOTE — Telephone Encounter (Signed)
Last seen on 04/14/23 per note " Continue Namenda 10 mg twice daily " Follow up scheduled 11/03/23

## 2023-08-31 ENCOUNTER — Ambulatory Visit: Payer: Medicare HMO | Admitting: Podiatry

## 2023-08-31 ENCOUNTER — Encounter: Payer: Self-pay | Admitting: Podiatry

## 2023-08-31 DIAGNOSIS — B351 Tinea unguium: Secondary | ICD-10-CM

## 2023-08-31 DIAGNOSIS — M79676 Pain in unspecified toe(s): Secondary | ICD-10-CM

## 2023-08-31 DIAGNOSIS — E119 Type 2 diabetes mellitus without complications: Secondary | ICD-10-CM | POA: Diagnosis not present

## 2023-08-31 NOTE — Progress Notes (Signed)
This patient returns to my office for at risk foot care.  This patient requires this care by a professional since this patient will be at risk due to having diabetes and CKD.and coagulation defect..  Patient is taking plavix and eliquis causing coagulation defect. This patient is unable to cut nails himself since the patient cannot reach his nails.These nails are painful walking and wearing shoes.  This patient presents for at risk foot care today.  General Appearance  Alert, conversant and in no acute stress.  Vascular  Dorsalis pedis and posterior tibial  pulses are palpable  bilaterally.  Capillary return is within normal limits  bilaterally. Temperature is within normal limits  bilaterally.  Neurologic  Senn-Weinstein monofilament wire test within normal limits  bilaterally. Muscle power within normal limits bilaterally.  Nails Thick disfigured discolored nails with subungual debris  hallux nails  bilaterally. No evidence of bacterial infection or drainage bilaterally.  Asymptomatic left hallux nail.  Orthopedic  No limitations of motion  feet .  No crepitus or effusions noted.  HAV  B/L.  Overlapping second toe left foot.  Skin  normotropic skin with no porokeratosis noted bilaterally.  No signs of infections or ulcers noted.   Asymptomatic pinch callus.  Onychomycosis  Pain in right toes  Pain in left toes  Consent was obtained for treatment procedures.   Mechanical debridement of nails 1-5  bilaterally performed with a nail nipper.  Filed with dremel without incident. Remove dead skin left hallux from healed blister.   Return office visit 3  months                  Told patient to return for periodic foot care and evaluation due to potential at risk complications.   Helane Gunther DPM

## 2023-09-10 ENCOUNTER — Telehealth: Payer: Self-pay | Admitting: Adult Health

## 2023-09-10 ENCOUNTER — Ambulatory Visit: Payer: Medicare HMO | Admitting: Cardiology

## 2023-09-10 DIAGNOSIS — F0150 Vascular dementia without behavioral disturbance: Secondary | ICD-10-CM

## 2023-09-10 NOTE — Telephone Encounter (Signed)
 Called and let pt wife know that the referral was placed as requested and she voiced gratitude and understanding

## 2023-09-10 NOTE — Telephone Encounter (Signed)
 Yes, please place order as requested. Thank you.

## 2023-09-10 NOTE — Addendum Note (Signed)
 Addended by: Danne Harbor on: 09/10/2023 03:24 PM   Modules accepted: Orders

## 2023-09-10 NOTE — Telephone Encounter (Signed)
 Called and let pt wife know that we are awaiting [facility]'s response and she voiced gratitude understanding.

## 2023-09-10 NOTE — Telephone Encounter (Signed)
 Pt wife has called back asking that the referral be sent to [facility] [phone] this office is in [facility] and wife's choice over having to go to [facility]

## 2023-09-10 NOTE — Telephone Encounter (Signed)
 Wife has called to report that she has found a place that accepts pt's insurance for the Neuro cognitive testing, a referral is needed for [facility], [phone]

## 2023-09-10 NOTE — Telephone Encounter (Signed)
 Referral for neuropsychology fax to [facility]. [phone], Fax: [phone]

## 2023-09-30 ENCOUNTER — Other Ambulatory Visit: Payer: Self-pay

## 2023-09-30 MED ORDER — SERTRALINE HCL 25 MG PO TABS: 25.0000 mg | ORAL_TABLET | Freq: Every day | ORAL | 3 refills | Status: DC

## 2023-10-08 DIAGNOSIS — K50819 Crohn's disease of both small and large intestine with unspecified complications: Secondary | ICD-10-CM | POA: Diagnosis not present

## 2023-10-21 DIAGNOSIS — K50819 Crohn's disease of both small and large intestine with unspecified complications: Secondary | ICD-10-CM | POA: Diagnosis not present

## 2023-11-03 ENCOUNTER — Encounter: Payer: Self-pay | Admitting: Adult Health

## 2023-11-03 ENCOUNTER — Ambulatory Visit: Payer: Medicare HMO | Admitting: Adult Health

## 2023-11-03 VITALS — BP 125/61 | HR 79 | Ht 70.0 in | Wt 174.0 lb

## 2023-11-03 DIAGNOSIS — Z820 Family history of epilepsy and other diseases of the nervous system: Secondary | ICD-10-CM | POA: Diagnosis not present

## 2023-11-03 DIAGNOSIS — Z8673 Personal history of transient ischemic attack (TIA), and cerebral infarction without residual deficits: Secondary | ICD-10-CM | POA: Diagnosis not present

## 2023-11-03 DIAGNOSIS — R413 Other amnesia: Secondary | ICD-10-CM | POA: Diagnosis not present

## 2023-11-03 DIAGNOSIS — F03B0 Unspecified dementia, moderate, without behavioral disturbance, psychotic disturbance, mood disturbance, and anxiety: Secondary | ICD-10-CM | POA: Diagnosis not present

## 2023-11-03 DIAGNOSIS — F01A0 Vascular dementia, mild, without behavioral disturbance, psychotic disturbance, mood disturbance, and anxiety: Secondary | ICD-10-CM | POA: Diagnosis not present

## 2023-11-03 MED ORDER — DONEPEZIL HCL 10 MG PO TABS: 10.0000 mg | ORAL_TABLET | Freq: Every day | ORAL | 3 refills | Status: DC

## 2023-11-03 MED ORDER — SERTRALINE HCL 25 MG PO TABS: 25.0000 mg | ORAL_TABLET | Freq: Every day | ORAL | 3 refills | Status: DC

## 2023-11-03 MED ORDER — MEMANTINE HCL 10 MG PO TABS: 10.0000 mg | ORAL_TABLET | Freq: Two times a day (BID) | ORAL | 3 refills | Status: DC

## 2023-11-03 NOTE — Patient Instructions (Signed)
 Your Plan:  Continue Aricept 10 mg daily and Namenda 10 mg twice daily  Continue sertraline 25 mg daily for depression  Follow-up with neuropsychology as scheduled on April 22 with Dr. Annette Barters  Will check lab work today to look for possible Alzheimer's type dementia    Follow-up in 6 months or call earlier if needed     Thank you for coming to see us  at [facility]. I hope we have been able to provide you high quality care today.  You may receive a patient satisfaction survey over the next few weeks. We would appreciate your feedback and comments so that we may continue to improve ourselves and the health of our patients.

## 2023-11-03 NOTE — Progress Notes (Signed)
 [facility] [address] [place]. [place] [address] [phone]       OFFICE FOLLOW UP VISIT NOTE  Mr. Adhrit Krenz Magid Date of Birth:  1945-11-30 Medical Record Number:  [id number]   Primary neurologist: Dr. Pearlean Brownie Referring MD: Dr. Roda Shutters Reason for Referral: Dementia     HPI:   Initial visit 12/06/2020 Dr. Karle Barr. Faiola is a 78 Caucasian male seen today for initial office consultation visit following hospital admission for confusion.  History is obtained from the patient and wife and review of electronic medical records and I personally reviewed available pertinent imaging films in PACS.  He has past medical history of right pontine stroke in 2013, coronary artery disease s/p CABG December 21, hypertension, hyperlipidemia, cardiomyopathy, left ventricular thrombus and pulmonary emboli.  Patient was admitted to [facility] in February 2022 for altered mental status for couple of weeks.  He had some odd behavior and was confused on multiple occasions.  CT scan of the head was showed suspected subacute left frontal and old right parietal infarcts.  MRI scan showed some T2 shine through but no acute infarct and dislocations.  2D echo showed ejection fraction of 40% with a kinesis of the inferior and inferior septal base with aneurysmal dilatation.  This was improved compared with previous echo from January 2022 when there was apical thrombus which was no longer seen.  EEG showed mild diffuse slowing suggestive of encephalopathy but no seizures.  LDL cholesterol 25 mg percent.  Hemoglobin A1c was 6.8.  Patient had been on Coumadin which she had a tough time taking and was switched to Eliquis which she is not tolerating much better without bruising or bleeding.  Continues to have memory loss and cognitive impairment which is unchanged.  Patient not been evaluated for reversible causes of cognitive impairment and not been tried on medication like Aricept or Namenda yet.   Patient had a remote history of a right pontine stroke in 2013 and had only mild speech difficulties which resolved completely.  He had no other strokes due to his recent cardiac surgery in December.  He had transient postop A. fib as well and a left ventricular thrombus on the echo which has since resolved.  There is no family history of Alzheimer's.  Patient has no history of seizures or significant head injury with loss of consciousness.  Patient's cognitive impairment is mostly related to short-term memory and remembering recent information.  He still mostly independent in activities of daily living and can attend to his own bodily needs.  He does get frustrated easily but there is no agitation, violent behavior, delusions or hallucinations.  He has not exhibited any unsafe behavior and does not wander off.  His balance is poor and he does use a cane and he fell once in the bathroom a month ago does not fall on a regular basis.  Update 03/13/2021 Dr. Pearlean Brownie; He returns for follow-up after last visit 3 months ago.  He is accompanied by his wife.   They feel his memory is slightly worse.  He does get confused off and on at times.  He is still mostly independent with activities of daily living but he needs constant reminders and repeated reminders about recent staffing which he forgets.  He has not been driving.  Patient has been complaining of getting tired easily and hence does not walk a lot.  Wife feels at times when he is tired he can drag his feet.  He has  had no falls or injuries.  There have been no delusions or hallucinations noted.  He does get occasionally agitated but there has been no violent behavior.  He continues to live with his wife and daughter and have somebody around him all the time.  He did undergo lab work on 12/06/2020 which was significant for low vitamin B12 levels of 153 otherwise TSH, homocystine and RPR were normal.  EEG on 12/31/2020 was normal.  Patient had a fall and was seen in the  ER on 02/17/2021 and had a CT scan of the head which showed mild generalized atrophy and bilateral lower cortical infarcts with no acute abnormality.  On Mini-Mental status exam today he actually scored 27 out of 30 which is an improvement from last visit when he had scored 22/30.  Update 09/23/2021 Dr. Janett Medin: He returns for follow-up after last visit 6 months ago.  He is accompanied by his wife and daughter.  They feel he is doing about the same.  His memory and cognitive difficulties are unchanged.  He remains on Namenda 10 mg twice daily which is tolerating well without side effects.  Patient seems to sleep a lot.  Does not walk a lot for exercise.  He has not been regularly doing cognitively challenging activities.  He has not had any new health issues.  Remains on Eliquis which is tolerating well without bleeding or bruising.  Blood pressures well controlled today it is 119/74.  Sugars are also under good control.  He is tolerating Crestor well without muscle aches and pains.  He has no new complaints today.  On Mini-Mental status exam today scored 25/30 which is similar to 27/30 at last visit 6 months ago.  Family does not feel he has had any significant cognitive decline  Update 03/27/2022 JM: Patient returns for 22-month memory follow-up accompanied by his wife and daughter.   Patient believes memory has been stable but family believes memory has gradually declined since prior visit. Will misplace items, will forget where their home is, short term memory worsening, difficulty operating remote or cell phone at times. Sleeps throughout the night, can have occasional nocturia, frequent daytime napping, will get up to eat or go to the bathroom. He doesn't do any type of physical or mental activity, wife tries to encourage him but he refuses. Does use his CPAP nightly. Appetite good.  Wife questions possible underlying depression that could be contributing.  Is scheduled next month for yearly physical with PCP.   Blood pressure not routinely monitored at home.  Glucose levels have been slightly elevated, past 7-day average 210, wife believes this is because he will forget that he just ate and then eat again.  No further concerns at this time  Update 09/25/2022 JM: Returns for 7-month follow-up. Patient believes his memory has been stable since prior visit. Per wife, believes memory has declined some since prior visit more so with short term memory. Reports he "sleeps like a log", sleeps all the time per wife, day and night.  Remains sedentary, not interested in participating in any type of activity centers or local gyms.  Does not do any mentally stimulating activities.  Appetite is good. No behavioral concerns. At prior visit, wife mentioned concerns of possible depression, has noticed some improvement of anger/outbursts on sertraline but believes he is more sleepy since starting. Did have lab work last fall with PCP, unable to view via epic, per wife, iron levels were low and currently on supplement and also started  on thyroid medication. Is currently taking B12 for known B12 deficiency. Has not had repeat levels since.   Update 04/14/2023 JM: Patient returns for follow-up visit accompanied by his wife and daughter. Reports continued memory decline since the last visit. Difficulty with short term memory. Continues to sleep majority of the day despite sleeping well at night. Did try to stop sertraline at prior visit due to concern of fatigue side effect but did not see any change therefore was restarted. Depression seems to be well controlled. Does not participate in any type of physical or mental activities.  Good appetite.  No behavioral concerns.  MMSE today 21/30 (prior 22/30). Wife is concerned that she feels his level of cognitive impairment is not well reflected on memory testing. Does have fm hx of dementia in mother and both siblings.  Is scheduled to see PCP next month for annual physical.     Update  11/03/2023 JM: Patient returns for follow-up visit accompanied by his wife and daughter.  Family unable to say for certain if memory has declined or has remained stable since prior visit.  Wife does note that there has not been any improvement.  He has remained on memantine and donepezil without side effects.  Denies any behavioral symptoms.  Reports he sleeps well at night but does nap frequently during the day.  Reports good appetite.  Mood stable on sertraline.  Has initial evaluation with neuropsychologist Dr. Annette Barters on 4/22.  Wife is eager to have testing completed as she feels this will help provide more answers, does note fm hx of Alzheimer's disease in his mother, sister and brother.  Ambulates outdoors with walking stick, no recent falls.  Reports he routinely follows with PCP.        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 s/p CABG in 06/2020 with LIMA-LAD, SVG-D1 and SVG-RCA   COVID-07 Dec 2019   Dementia [facility])    Depression    Essential hypertension    Hernia, umbilical    HOH (hard of hearing)    Hyperlipidemia    Ischemic cardiomyopathy    Ischemic necrosis of small bowel (HCC)    Left ventricular mural thrombus    NSTEMI (non-ST elevated myocardial infarction) (HCC)    OSA on CPAP    Pulmonary emboli [facility])    May 2021   Skin cancer    Type 2 diabetes mellitus (HCC)     Social History:  Social History   Socioeconomic History   Marital status: Married    Spouse name: [name]   Number of children: Not on file   Years of education: Not on file   Highest education level: Not on file  Occupational History   Not on file  Tobacco Use   Smoking status: Never   Smokeless tobacco: Never  Vaping Use   Vaping status: Never Used  Substance and  Sexual Activity   Alcohol use: No   Drug use: No   Sexual activity: Not on file  Other Topics Concern   Not on file  Social History Narrative   Lives with wife at Daughter's home   Right Handed   Drinks very little caffeine daily   Retired  Social Drivers of Corporate investment banker Strain: Not on file  Food Insecurity: No Food Insecurity (06/17/2023)   Hunger Vital Sign    Worried About Running Out of Food in the Last Year: Never true    Ran Out of Food in the Last Year: Never true  Transportation Needs: No Transportation Needs (06/17/2023)   PRAPARE - Administrator, Civil Service (Medical): No    Lack of Transportation (Non-Medical): No  Physical Activity: Not on file  Stress: Not on file  Social Connections: Unknown (11/29/2021)   [facility]   Social Network    Social Network: Not on file  Intimate Partner Violence: Not At Risk (06/17/2023)   Humiliation, Afraid, Rape, and Kick questionnaire    Fear of Current or Ex-Partner: No    Emotionally Abused: No    Physically Abused: No    Sexually Abused: No    Medications:   Current Outpatient Medications on File Prior to Visit  Medication Sig Dispense Refill   acetaminophen (TYLENOL) 325 MG tablet Take 2 tablets (650 mg total) by mouth every 6 (six) hours as needed for mild pain (pain score 1-3) (or Fever >/= 101).     acidophilus (RISAQUAD) CAPS capsule Take 1 capsule by mouth daily.     apixaban (ELIQUIS) 5 MG TABS tablet TAKE 1 TABLET(5 MG) BY MOUTH TWICE DAILY 180 tablet 1   BD PEN NEEDLE NANO U/F 32G X 4 MM MISC 1 each by Other route daily.      BIOTIN PO Take 5,000 mcg by mouth daily.     Continuous Blood Gluc Sensor (FREESTYLE LIBRE 2 SENSOR) MISC See admin instructions.     donepezil (ARICEPT) 10 MG tablet Take 1 tablet (10 mg total) by mouth at bedtime. 30 tablet 3   fenofibrate 160 MG tablet 1 capsule with a meal Orally Once a day     Insulin Aspart (NOVOLOG  FLEXPEN Collins) Inject 8-10 Units into the skin 3 (three) times daily before meals. Dose per sliding scale     insulin degludec (TRESIBA FLEXTOUCH) 100 UNIT/ML FlexTouch Pen Inject 32 Units into the skin daily.     [name] 10 MG TABS tablet TAKE 1 TABLET(10 MG) BY MOUTH DAILY BEFORE BREAKFAST 90 tablet 3   levothyroxine (SYNTHROID) 50 MCG tablet Take 50 mcg by mouth every morning.     losartan (COZAAR) 25 MG tablet TAKE 1/2 TABLET(12.5 MG) BY MOUTH DAILY 45 tablet 1   memantine (NAMENDA) 10 MG tablet TAKE 1 TABLET(10 MG) BY MOUTH TWICE DAILY 180 tablet 0   metFORMIN (GLUCOPHAGE) 500 MG tablet Take 500 mg by mouth 2 (two) times daily with a meal.     Multiple Vitamin (MULTIVITAMIN WITH MINERALS) TABS tablet Take 1 tablet by mouth daily.     mupirocin ointment (BACTROBAN) 2 % Apply topically 2 (two) times daily as needed.     MYRBETRIQ 25 MG TB24 tablet Take 25 mg by mouth daily.     rosuvastatin (CRESTOR) 20 MG tablet TAKE 1 TABLET(20 MG) BY MOUTH DAILY 90 tablet 3   sertraline (ZOLOFT) 25 MG tablet Take 1 tablet (25 mg total) by mouth daily. 30 tablet 3   tamsulosin (FLOMAX) 0.4 MG CAPS capsule Take 0.4 mg by mouth daily.     Vedolizumab (ENTYVIO IV) Inject into the vein every 28 (twenty-eight) days.     budesonide (ENTOCORT EC) 3 MG 24 hr capsule Take 3 mg by mouth daily.  cephALEXin (KEFLEX) 500 MG capsule Take 500 mg by mouth 3 (three) times daily.     Cyanocobalamin 2500 MCG TABS Take 2,500 mcg by mouth daily.     doxycycline (VIBRAMYCIN) 100 MG capsule Take 100 mg by mouth 2 (two) times daily.     ferrous sulfate 325 (65 FE) MG tablet Take by mouth.     No current facility-administered medications on file prior to visit.    Allergies:   Allergies  Allergen Reactions   Atorvastatin Other (See Comments)    Muscle pain   Sitagliptin Other (See Comments)    Myalgias    Physical Exam Today's Vitals   11/03/23 1318  BP: 125/61  Pulse: 79  Weight: 174 lb (78.9 kg)  Height: 5\' 10"   (1.778 m)   Body mass index is 24.97 kg/m.  General: well developed, well nourished very pleasant elderly Caucasian male, seated, in no evident distress  Neurologic Exam Mental Status: Awake and fully alert. Recent and remote memory diminished. Attention span, concentration and fund of knowledge poor with family providing history.  Mood and affect appropriate. Cranial Nerves: Pupils equal, briskly reactive to light. Extraocular movements full without nystagmus. Visual fields full to confrontation. Hearing significantly diminished bilaterally. facial sensation intact. Face, tongue, palate moves normally and symmetrically.  Motor: Normal bulk and tone. Normal strength in all tested extremity muscles Sensory.: intact to touch , pinprick , position and vibratory sensation.  Coordination: Rapid alternating movements normal in all extremities. Finger-to-nose and heel-to-shin performed accurately bilaterally. Gait and Station: Arises from chair with slight difficulty. Stance is stooped. Gait demonstrates normal stride length and only slight imbalance with use of walking stick.  Not able to heel, toe and tandem walk    Reflexes: 1+ and symmetric. Toes downgoing.        11/03/2023    1:38 PM 04/14/2023   11:08 AM 03/27/2022    3:09 PM  MMSE - Mini Mental State Exam  Orientation to time 3 3 4   Orientation to Place 5 5 5   Registration 3 3 2   Attention/ Calculation 2 1 1   Recall 1 3 2   Language- name 2 objects 2 2 2   Language- repeat 1 0 1  Language- follow 3 step command 2 3 3   Language- read & follow direction 1 0 1  Write a sentence 0 0 1  Copy design 0 1 1  Total score 20 21 23            ASSESSMENT/PLAN: 78 year old Caucasian male with cognitive deterioration and memory loss with onset around 06/2020 following cardiac surgery.  History of remote right pontine infarct in 2013 and suspect left frontal and right parietal infarcts following cardiac surgery in December 2021.  Vascular  risk factors of coronary artery disease, left ventricular thrombus, perioperative atrial fibrillation, diabetes, hypertension, hyperlipidemia and age    -Suspect cognitive impairment more vascular in etiology but possible underlying Alzheimer's due to extensive family history -per wife request, will obtain ATN profile today  -Has initial visit with neuropsychologist Dr. Roseanne Reno on 4/22 for neurocognitive testing -MMSE 20/30 today, prior 21/30 (03/2023) -Continue Namenda 10 mg twice daily - refill provided - Continue donepezil 10 mg daily -Continue sertraline 25 mg daily for depression management -Discussed importance of increasing both physical and mental activities as well as ensuring good sleep and healthy diet and management of vascular risk factors      Follow-up in 6 months or call earlier if needed     CC:  Jacky Kindle,  Richard, MD   I spent 30 minutes of face-to-face and non-face-to-face time with patient and wife and daughter.  This included previsit chart review, lab review, study review, order entry, electronic health record documentation, patient and wife education and discussion regarding above diagnoses and treatment plan and answered all other questions to patient and wife and daughter's satisfaction   Johny Nap, [facility]  [facility] [address] [place], [place] [address]  Phone [phone] Fax [phone] Note: This document was prepared with digital dictation and possible smart phrase technology. Any transcriptional errors that result from this process are unintentional.

## 2023-11-06 LAB — ATN PROFILE
A -- Beta-amyloid 42/40 Ratio: 0.117 (ref >0.102)
Beta-amyloid 40: 224.94 pg/mL
Beta-amyloid 42: 26.31 pg/mL
N -- NfL, Plasma: 3.2 pg/mL (ref 0.00–7.64)
T -- p-tau181: 1.75 pg/mL — ABNORMAL HIGH (ref 0.00–0.97)

## 2023-11-09 ENCOUNTER — Encounter: Payer: Self-pay | Admitting: Adult Health

## 2023-11-09 DIAGNOSIS — E1129 Type 2 diabetes mellitus with other diabetic kidney complication: Secondary | ICD-10-CM | POA: Diagnosis not present

## 2023-11-09 DIAGNOSIS — I129 Hypertensive chronic kidney disease with stage 1 through stage 4 chronic kidney disease, or unspecified chronic kidney disease: Secondary | ICD-10-CM | POA: Diagnosis not present

## 2023-11-09 DIAGNOSIS — E785 Hyperlipidemia, unspecified: Secondary | ICD-10-CM | POA: Diagnosis not present

## 2023-11-09 DIAGNOSIS — Z794 Long term (current) use of insulin: Secondary | ICD-10-CM | POA: Diagnosis not present

## 2023-11-09 DIAGNOSIS — N1831 Chronic kidney disease, stage 3a: Secondary | ICD-10-CM | POA: Diagnosis not present

## 2023-11-09 DIAGNOSIS — F329 Major depressive disorder, single episode, unspecified: Secondary | ICD-10-CM | POA: Diagnosis not present

## 2023-11-09 DIAGNOSIS — E11319 Type 2 diabetes mellitus with unspecified diabetic retinopathy without macular edema: Secondary | ICD-10-CM | POA: Diagnosis not present

## 2023-11-09 DIAGNOSIS — I2510 Atherosclerotic heart disease of native coronary artery without angina pectoris: Secondary | ICD-10-CM | POA: Diagnosis not present

## 2023-11-09 DIAGNOSIS — F03A0 Unspecified dementia, mild, without behavioral disturbance, psychotic disturbance, mood disturbance, and anxiety: Secondary | ICD-10-CM | POA: Diagnosis not present

## 2023-11-10 ENCOUNTER — Ambulatory Visit: Payer: Medicare HMO | Admitting: Internal Medicine

## 2023-11-10 DIAGNOSIS — F0280 Dementia in other diseases classified elsewhere without behavioral disturbance: Secondary | ICD-10-CM | POA: Diagnosis not present

## 2023-11-10 DIAGNOSIS — F0150 Vascular dementia without behavioral disturbance: Secondary | ICD-10-CM | POA: Diagnosis not present

## 2023-11-10 DIAGNOSIS — G309 Alzheimer's disease, unspecified: Secondary | ICD-10-CM | POA: Diagnosis not present

## 2023-11-13 ENCOUNTER — Ambulatory Visit: Payer: Medicare HMO | Attending: Cardiology | Admitting: Cardiology

## 2023-11-13 ENCOUNTER — Encounter: Payer: Self-pay | Admitting: Cardiology

## 2023-11-13 VITALS — BP 118/64 | HR 51 | Ht 70.0 in | Wt 171.8 lb

## 2023-11-13 DIAGNOSIS — I480 Paroxysmal atrial fibrillation: Secondary | ICD-10-CM | POA: Diagnosis not present

## 2023-11-13 DIAGNOSIS — I25119 Atherosclerotic heart disease of native coronary artery with unspecified angina pectoris: Secondary | ICD-10-CM

## 2023-11-13 DIAGNOSIS — I5022 Chronic systolic (congestive) heart failure: Secondary | ICD-10-CM | POA: Diagnosis not present

## 2023-11-13 DIAGNOSIS — E782 Mixed hyperlipidemia: Secondary | ICD-10-CM

## 2023-11-13 NOTE — Progress Notes (Signed)
    Cardiology Office Note  Date: 11/13/2023   ID: Patrick Miranda, DOB 26-Feb-1946, MRN [id number]  History of Present Illness: Patrick Miranda is a 78 y.o. male last seen in August 2024.  He is here for a routine visit with his wife.  He does not report any major change in stamina, no exertional chest pain or palpitations, stable NYHA class II dyspnea.  He has had no fluid retention and weight has been stable.  We went over his medications.  He reports compliance with therapy, no obvious intolerances.  No spontaneous bleeding problems reported on Eliquis .  I reviewed his interval lab work which is noted below.  Physical Exam: VS:  BP 118/64   Pulse (!) 51   Ht 5\' 10"  (1.778 m)   Wt 171 lb 12.8 oz (77.9 kg)   SpO2 97%   BMI 24.65 kg/m , BMI Body mass index is 24.65 kg/m.  Wt Readings from Last 3 Encounters:  11/13/23 171 lb 12.8 oz (77.9 kg)  11/03/23 174 lb (78.9 kg)  06/17/23 175 lb 11.3 oz (79.7 kg)    General: Patient appears comfortable at rest. HEENT: Conjunctiva and lids normal. Neck: Supple, no elevated JVP or carotid bruits. Lungs: Clear to auscultation, nonlabored breathing at rest. Cardiac: Regular rate and rhythm, no S3 or significant systolic murmur. Extremities: No pitting edema.  ECG:  An ECG dated 04/19/2023 was personally reviewed today and demonstrated:  Sinus bradycardia with IVCD.  Labwork: 06/17/2023: ALT 12; AST 23 06/18/2023: BUN 23; Hemoglobin 9.3; Platelets 222; Potassium 3.6; Sodium 140 06/19/2023: Creatinine, Ser 1.58  October 2024: Cholesterol 94, triglycerides 80, HDL 36, LDL 42  Other Studies Reviewed Today:  No interval cardiac testing for review today.  Assessment and Plan:  1.  HFmrEF with ischemic cardiomyopathy, LVEF approximately 45 to 50% by echocardiogram in December 2023.  He reports NYHA class II symptoms, no fluid retention.  Plan to continue medical therapy and observation.  Currently on [name]  10 mg daily and Cozaar   12.5 mg daily.  He was taken off Toprol -XL given symptomatic bradycardia.  No other additional diuretic requirement at this time.   2.  Paroxysmal atrial fibrillation with CHA2DS2-VASc score of 7.  Asymptomatic in terms of palpitations.  He continues on Eliquis  5 mg twice daily for stroke prophylaxis, no spontaneous bleeding problems reported.   3.  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  No angina at current level of activity.  Continue Crestor  20 mg daily.  LDL 42 in October 2024.   4.  Primary hypertension.  No change to current regimen.  Disposition:  Follow up  6 months.  Signed, Gerard Knight, M.D., F.A.C.C. [facility]

## 2023-11-13 NOTE — Patient Instructions (Addendum)

## 2023-11-14 DIAGNOSIS — E1165 Type 2 diabetes mellitus with hyperglycemia: Secondary | ICD-10-CM | POA: Diagnosis not present

## 2023-11-14 DIAGNOSIS — E1129 Type 2 diabetes mellitus with other diabetic kidney complication: Secondary | ICD-10-CM | POA: Diagnosis not present

## 2023-11-30 ENCOUNTER — Encounter: Payer: Self-pay | Admitting: Podiatry

## 2023-11-30 ENCOUNTER — Ambulatory Visit: Payer: Medicare HMO | Admitting: Podiatry

## 2023-11-30 DIAGNOSIS — B351 Tinea unguium: Secondary | ICD-10-CM

## 2023-11-30 DIAGNOSIS — D689 Coagulation defect, unspecified: Secondary | ICD-10-CM

## 2023-11-30 DIAGNOSIS — M79676 Pain in unspecified toe(s): Secondary | ICD-10-CM

## 2023-11-30 DIAGNOSIS — E119 Type 2 diabetes mellitus without complications: Secondary | ICD-10-CM | POA: Diagnosis not present

## 2023-11-30 NOTE — Progress Notes (Signed)
This patient returns to my office for at risk foot care.  This patient requires this care by a professional since this patient will be at risk due to having diabetes and CKD.and coagulation defect..  Patient is taking plavix and eliquis causing coagulation defect. This patient is unable to cut nails himself since the patient cannot reach his nails.These nails are painful walking and wearing shoes.  This patient presents for at risk foot care today.  General Appearance  Alert, conversant and in no acute stress.  Vascular  Dorsalis pedis and posterior tibial  pulses are palpable  bilaterally.  Capillary return is within normal limits  bilaterally. Temperature is within normal limits  bilaterally.  Neurologic  Senn-Weinstein monofilament wire test within normal limits  bilaterally. Muscle power within normal limits bilaterally.  Nails Thick disfigured discolored nails with subungual debris  hallux nails  bilaterally. No evidence of bacterial infection or drainage bilaterally.  Asymptomatic left hallux nail.  Orthopedic  No limitations of motion  feet .  No crepitus or effusions noted.  HAV  B/L.  Overlapping second toe left foot.  Skin  normotropic skin with no porokeratosis noted bilaterally.  No signs of infections or ulcers noted.   Asymptomatic pinch callus.  Onychomycosis  Pain in right toes  Pain in left toes  Consent was obtained for treatment procedures.   Mechanical debridement of nails 1-5  bilaterally performed with a nail nipper.  Filed with dremel without incident.    Return office visit 3  months                  Told patient to return for periodic foot care and evaluation due to potential at risk complications.   Helane Gunther DPM

## 2023-12-03 DIAGNOSIS — K50819 Crohn's disease of both small and large intestine with unspecified complications: Secondary | ICD-10-CM | POA: Diagnosis not present

## 2023-12-07 DIAGNOSIS — D485 Neoplasm of uncertain behavior of skin: Secondary | ICD-10-CM | POA: Diagnosis not present

## 2023-12-07 DIAGNOSIS — C44329 Squamous cell carcinoma of skin of other parts of face: Secondary | ICD-10-CM | POA: Diagnosis not present

## 2023-12-17 DIAGNOSIS — N401 Enlarged prostate with lower urinary tract symptoms: Secondary | ICD-10-CM | POA: Diagnosis not present

## 2023-12-17 DIAGNOSIS — R3915 Urgency of urination: Secondary | ICD-10-CM | POA: Diagnosis not present

## 2023-12-17 DIAGNOSIS — R350 Frequency of micturition: Secondary | ICD-10-CM | POA: Diagnosis not present

## 2024-01-03 NOTE — Progress Notes (Unsigned)
 Subjective:    Patient ID: Patrick Miranda, male    DOB: 09/13/1945, 78 y.o.   MRN: [id number]  HPI M never smoker followed for OSA vasomotor rhinitis, complicated by history brainstem CVA, CAD, DM 2, HBP, anemia. NPSG 2007 AHI 31.3/ hr, CPAP to 9, weight 180 lbs.  ----------------------------------------------------------------------------------------   $/59/41- 78 year old male never smoker followed for OSA, chronic rhinitis, complicated by hx Brainstem CVA, Dementia, CAD/Ischemic CM/ CABG,  DM 2, HBP, anemia, CKD, Laparotomy for Small Bowel Perforation Feb, 2022, Covid March2024,  CPAP auto 7-13/ [facility] in [place]   AirSense 10 AutoSet Download-compliance 100%, AHI 14 [facility] Body weight today-                                                                    Wife here He defers to his wife for answers to many questions but overall says he is sleeping okay.  His mask gets loose frequently.  We discussed central apneas.  He and she would like to try refitting mask for his CPAP machine now rather than doing a BiPAP ST sleep study to see if he qualifies for treatment directed specifically at central apneas.  01/04/24- 78 year old male never smoker followed for OSA, chronic rhinitis, complicated by hx Brainstem CVA, Dementia, CAD/Ischemic CM/ CABG,  DM 2, HBP, anemia, CKD, Laparotomy for Small Bowel Perforation Feb, 2022, Covid March2024,  CPAP auto 7-13/ [facility] in [place]   AirSense 10 AutoSet Download-compliance  Body weight today-    CXR 04/19/23 IMPRESSION: 1. No active disease. 2. Mild cardiomegaly.         ROS-see HPI   + = positive Constitutional:    weight loss, night sweats, fevers, chills, + fatigue, lassitude. HEENT:    headaches, difficulty swallowing, tooth/dental problems, sore throat,       sneezing, itching, ear ache, nasal congestion, post nasal drip, snoring CV:    chest pain, orthopnea, PND, swelling in lower extremities, anasarca,                                                       dizziness, palpitations Resp:   shortness of breath with exertion or at rest.                productive cough,   non-productive cough, coughing up of blood.              change in color of mucus.  wheezing.   Skin:    rash or lesions. GI:  No-   heartburn, indigestion, abdominal pain, nausea, vomiting, GU:  MS:   joint pain, stiffness, Neuro-     nothing unusual Psych:  change in mood or affect.  depression or anxiety.   memory loss.      Objective:   Physical Exam OBJ- Physical Exam General- Alert, Oriented, Affect-appropriate, Distress- none acute,  Skin- rash-none, lesions- none, excoriation- none Lymphadenopathy- none Head- atraumatic            Eyes- Gross vision intact, PERRLA, conjunctivae and secretions clear  Ears- Hearing, canals-normal            Nose- , no-Septal dev, mucus, polyps, erosion, perforation             Throat- Mallampati II-III , mucosa clear , drainage- none, tonsils- atrophic,hoarse Neck- flexible , trachea midline, no stridor , thyroid  nl, carotid no bruit Chest - symmetrical excursion , unlabored           Heart/CV- RRR/ almost regular , no murmur , no gallop  , no rub, nl s1 s2                           - JVD- none , edema- none, stasis changes- none, varices- none           Lung- clear to P&A, wheeze- none, cough-none , dullness-none, rub- none           Chest wall-  Abd-  Br/ Gen/ Rectal- Not done, not indicated Extrem- cyanosis- none, clubbing, none, atrophy- none, strength- nl Neuro- grossly intact to observation  Assessment & Plan:

## 2024-01-04 ENCOUNTER — Encounter: Payer: Self-pay | Admitting: Internal Medicine

## 2024-01-04 ENCOUNTER — Ambulatory Visit: Admitting: Internal Medicine

## 2024-01-04 VITALS — BP 126/64 | HR 61 | Temp 97.8°F | Ht 70.0 in | Wt 175.4 lb

## 2024-01-04 DIAGNOSIS — G4733 Obstructive sleep apnea (adult) (pediatric): Secondary | ICD-10-CM | POA: Diagnosis not present

## 2024-01-04 MED ORDER — AZITHROMYCIN 250 MG PO TABS: ORAL_TABLET | ORAL | 0 refills | Status: DC

## 2024-01-04 NOTE — Patient Instructions (Signed)
 You are doing well with your CPAP- no changes.  For the mild bronchitis from your cold- I have sent a script for Zpak antibiotic and you can take otc Delsym or Robitussin DM cough syrup to help with cough as needed,.

## 2024-01-05 ENCOUNTER — Other Ambulatory Visit: Payer: Self-pay

## 2024-01-05 MED ORDER — JARDIANCE 10 MG PO TABS: 10.0000 mg | ORAL_TABLET | Freq: Every day | ORAL | 3 refills | Status: DC

## 2024-01-06 ENCOUNTER — Other Ambulatory Visit: Payer: Self-pay

## 2024-01-06 ENCOUNTER — Encounter (HOSPITAL_COMMUNITY): Payer: Self-pay

## 2024-01-06 ENCOUNTER — Inpatient Hospital Stay (HOSPITAL_COMMUNITY)
Admission: EM | Admit: 2024-01-06 | Discharge: 2024-01-12 | DRG: 275 | Disposition: A | Discharge status: Home Health | Attending: Cardiology | Admitting: Cardiology

## 2024-01-06 ENCOUNTER — Emergency Department (HOSPITAL_COMMUNITY)

## 2024-01-06 DIAGNOSIS — Z8249 Family history of ischemic heart disease and other diseases of the circulatory system: Secondary | ICD-10-CM

## 2024-01-06 DIAGNOSIS — Z820 Family history of epilepsy and other diseases of the nervous system: Secondary | ICD-10-CM

## 2024-01-06 DIAGNOSIS — I2510 Atherosclerotic heart disease of native coronary artery without angina pectoris: Secondary | ICD-10-CM | POA: Diagnosis present

## 2024-01-06 DIAGNOSIS — Z79899 Other long term (current) drug therapy: Secondary | ICD-10-CM

## 2024-01-06 DIAGNOSIS — R0902 Hypoxemia: Secondary | ICD-10-CM | POA: Diagnosis not present

## 2024-01-06 DIAGNOSIS — Z951 Presence of aortocoronary bypass graft: Secondary | ICD-10-CM | POA: Diagnosis not present

## 2024-01-06 DIAGNOSIS — Z7901 Long term (current) use of anticoagulants: Secondary | ICD-10-CM

## 2024-01-06 DIAGNOSIS — I25810 Atherosclerosis of coronary artery bypass graft(s) without angina pectoris: Secondary | ICD-10-CM | POA: Diagnosis present

## 2024-01-06 DIAGNOSIS — I255 Ischemic cardiomyopathy: Secondary | ICD-10-CM | POA: Diagnosis present

## 2024-01-06 DIAGNOSIS — F0390 Unspecified dementia without behavioral disturbance: Secondary | ICD-10-CM | POA: Diagnosis not present

## 2024-01-06 DIAGNOSIS — G4733 Obstructive sleep apnea (adult) (pediatric): Secondary | ICD-10-CM | POA: Diagnosis present

## 2024-01-06 DIAGNOSIS — Z8616 Personal history of COVID-19: Secondary | ICD-10-CM | POA: Diagnosis not present

## 2024-01-06 DIAGNOSIS — I110 Hypertensive heart disease with heart failure: Secondary | ICD-10-CM | POA: Diagnosis present

## 2024-01-06 DIAGNOSIS — I472 Ventricular tachycardia, unspecified: Secondary | ICD-10-CM | POA: Diagnosis not present

## 2024-01-06 DIAGNOSIS — I4729 Other ventricular tachycardia: Secondary | ICD-10-CM | POA: Diagnosis not present

## 2024-01-06 DIAGNOSIS — E785 Hyperlipidemia, unspecified: Secondary | ICD-10-CM | POA: Diagnosis present

## 2024-01-06 DIAGNOSIS — W19XXXA Unspecified fall, initial encounter: Secondary | ICD-10-CM | POA: Diagnosis present

## 2024-01-06 DIAGNOSIS — Z85828 Personal history of other malignant neoplasm of skin: Secondary | ICD-10-CM

## 2024-01-06 DIAGNOSIS — I480 Paroxysmal atrial fibrillation: Secondary | ICD-10-CM | POA: Diagnosis present

## 2024-01-06 DIAGNOSIS — I21A1 Myocardial infarction type 2: Secondary | ICD-10-CM | POA: Diagnosis not present

## 2024-01-06 DIAGNOSIS — Z86711 Personal history of pulmonary embolism: Secondary | ICD-10-CM

## 2024-01-06 DIAGNOSIS — I499 Cardiac arrhythmia, unspecified: Secondary | ICD-10-CM | POA: Diagnosis not present

## 2024-01-06 DIAGNOSIS — Z7984 Long term (current) use of oral hypoglycemic drugs: Secondary | ICD-10-CM

## 2024-01-06 DIAGNOSIS — R55 Syncope and collapse: Secondary | ICD-10-CM | POA: Diagnosis not present

## 2024-01-06 DIAGNOSIS — Z9079 Acquired absence of other genital organ(s): Secondary | ICD-10-CM

## 2024-01-06 DIAGNOSIS — Z794 Long term (current) use of insulin: Secondary | ICD-10-CM

## 2024-01-06 DIAGNOSIS — I5022 Chronic systolic (congestive) heart failure: Secondary | ICD-10-CM | POA: Diagnosis not present

## 2024-01-06 DIAGNOSIS — Z515 Encounter for palliative care: Secondary | ICD-10-CM

## 2024-01-06 DIAGNOSIS — Z79890 Hormone replacement therapy (postmenopausal): Secondary | ICD-10-CM

## 2024-01-06 DIAGNOSIS — E119 Type 2 diabetes mellitus without complications: Secondary | ICD-10-CM | POA: Diagnosis present

## 2024-01-06 DIAGNOSIS — Z7189 Other specified counseling: Secondary | ICD-10-CM | POA: Diagnosis not present

## 2024-01-06 DIAGNOSIS — R5383 Other fatigue: Secondary | ICD-10-CM | POA: Diagnosis present

## 2024-01-06 DIAGNOSIS — R001 Bradycardia, unspecified: Secondary | ICD-10-CM | POA: Diagnosis present

## 2024-01-06 DIAGNOSIS — E876 Hypokalemia: Secondary | ICD-10-CM

## 2024-01-06 DIAGNOSIS — I252 Old myocardial infarction: Secondary | ICD-10-CM

## 2024-01-06 DIAGNOSIS — I462 Cardiac arrest due to underlying cardiac condition: Secondary | ICD-10-CM | POA: Diagnosis not present

## 2024-01-06 DIAGNOSIS — Z8673 Personal history of transient ischemic attack (TIA), and cerebral infarction without residual deficits: Secondary | ICD-10-CM

## 2024-01-06 DIAGNOSIS — R531 Weakness: Secondary | ICD-10-CM | POA: Diagnosis not present

## 2024-01-06 DIAGNOSIS — R4189 Other symptoms and signs involving cognitive functions and awareness: Secondary | ICD-10-CM

## 2024-01-06 DIAGNOSIS — Z96653 Presence of artificial knee joint, bilateral: Secondary | ICD-10-CM | POA: Diagnosis present

## 2024-01-06 DIAGNOSIS — J449 Chronic obstructive pulmonary disease, unspecified: Secondary | ICD-10-CM | POA: Diagnosis present

## 2024-01-06 DIAGNOSIS — Z888 Allergy status to other drugs, medicaments and biological substances status: Secondary | ICD-10-CM

## 2024-01-06 DIAGNOSIS — I493 Ventricular premature depolarization: Secondary | ICD-10-CM | POA: Diagnosis present

## 2024-01-06 DIAGNOSIS — R0989 Other specified symptoms and signs involving the circulatory and respiratory systems: Secondary | ICD-10-CM | POA: Diagnosis not present

## 2024-01-06 DIAGNOSIS — H9190 Unspecified hearing loss, unspecified ear: Secondary | ICD-10-CM | POA: Diagnosis present

## 2024-01-06 LAB — CBC WITH DIFFERENTIAL/PLATELET
Abs Immature Granulocytes: 0.05 10*3/uL (ref 0.00–0.07)
Basophils Absolute: 0.1 10*3/uL (ref 0.0–0.1)
Basophils Relative: 1 %
Eosinophils Absolute: 0.4 10*3/uL (ref 0.0–0.5)
Eosinophils Relative: 4 %
HCT: 36.8 % — ABNORMAL LOW (ref 39.0–52.0)
Hemoglobin: 11.2 g/dL — ABNORMAL LOW (ref 13.0–17.0)
Immature Granulocytes: 1 %
Lymphocytes Relative: 57 %
Lymphs Abs: 5.7 10*3/uL — ABNORMAL HIGH (ref 0.7–4.0)
MCH: 28.5 pg (ref 26.0–34.0)
MCHC: 30.4 g/dL (ref 30.0–36.0)
MCV: 93.6 fL (ref 80.0–100.0)
Monocytes Absolute: 0.7 10*3/uL (ref 0.1–1.0)
Monocytes Relative: 7 %
Neutro Abs: 3 10*3/uL (ref 1.7–7.7)
Neutrophils Relative %: 30 %
Platelets: 281 10*3/uL (ref 150–400)
RBC: 3.93 MIL/uL — ABNORMAL LOW (ref 4.22–5.81)
RDW: 14.9 % (ref 11.5–15.5)
Smear Review: NORMAL
WBC Morphology: ABNORMAL
WBC: 9.9 10*3/uL (ref 4.0–10.5)
nRBC: 0 % (ref 0.0–0.2)

## 2024-01-06 LAB — I-STAT VENOUS BLOOD GAS, ED
Acid-base deficit: 4 mmol/L — ABNORMAL HIGH (ref 0.0–2.0)
Bicarbonate: 21.9 mmol/L (ref 20.0–28.0)
Calcium, Ion: 1.21 mmol/L (ref 1.15–1.40)
HCT: 31 % — ABNORMAL LOW (ref 39.0–52.0)
Hemoglobin: 10.5 g/dL — ABNORMAL LOW (ref 13.0–17.0)
O2 Saturation: 74 %
Potassium: 3.7 mmol/L (ref 3.5–5.1)
Sodium: 147 mmol/L — ABNORMAL HIGH (ref 135–145)
TCO2: 23 mmol/L (ref 22–32)
pCO2, Ven: 44.9 mmHg (ref 44–60)
pH, Ven: 7.297 (ref 7.25–7.43)
pO2, Ven: 44 mmHg (ref 32–45)

## 2024-01-06 LAB — APTT: aPTT: 31 s (ref 24–36)

## 2024-01-06 LAB — BASIC METABOLIC PANEL WITH GFR
Anion gap: 8 (ref 5–15)
BUN: 16 mg/dL (ref 8–23)
CO2: 20 mmol/L — ABNORMAL LOW (ref 22–32)
Calcium: 8.7 mg/dL — ABNORMAL LOW (ref 8.9–10.3)
Chloride: 114 mmol/L — ABNORMAL HIGH (ref 98–111)
Creatinine, Ser: 1.35 mg/dL — ABNORMAL HIGH (ref 0.61–1.24)
GFR, Estimated: 54 mL/min — ABNORMAL LOW (ref >60)
Glucose, Bld: 134 mg/dL — ABNORMAL HIGH (ref 70–99)
Potassium: 5.5 mmol/L — ABNORMAL HIGH (ref 3.5–5.1)
Sodium: 142 mmol/L (ref 135–145)

## 2024-01-06 LAB — HEMOGLOBIN A1C
Hgb A1c MFr Bld: 7.8 % — ABNORMAL HIGH (ref 4.8–5.6)
Mean Plasma Glucose: 177.16 mg/dL

## 2024-01-06 LAB — COMPREHENSIVE METABOLIC PANEL WITH GFR
ALT: 11 U/L (ref 0–44)
AST: 45 U/L — ABNORMAL HIGH (ref 15–41)
Albumin: 2.8 g/dL — ABNORMAL LOW (ref 3.5–5.0)
Alkaline Phosphatase: 49 U/L (ref 38–126)
Anion gap: 10 (ref 5–15)
BUN: 18 mg/dL (ref 8–23)
CO2: 19 mmol/L — ABNORMAL LOW (ref 22–32)
Calcium: 8.5 mg/dL — ABNORMAL LOW (ref 8.9–10.3)
Chloride: 113 mmol/L — ABNORMAL HIGH (ref 98–111)
Creatinine, Ser: 1.74 mg/dL — ABNORMAL HIGH (ref 0.61–1.24)
GFR, Estimated: 40 mL/min — ABNORMAL LOW (ref >60)
Glucose, Bld: 232 mg/dL — ABNORMAL HIGH (ref 70–99)
Potassium: 3 mmol/L — ABNORMAL LOW (ref 3.5–5.1)
Sodium: 142 mmol/L (ref 135–145)
Total Bilirubin: 0.5 mg/dL (ref 0.0–1.2)
Total Protein: 5.5 g/dL — ABNORMAL LOW (ref 6.5–8.1)

## 2024-01-06 LAB — MAGNESIUM: Magnesium: 2 mg/dL (ref 1.7–2.4)

## 2024-01-06 LAB — TROPONIN I (HIGH SENSITIVITY)
Troponin I (High Sensitivity): 26 ng/L — ABNORMAL HIGH (ref <18)
Troponin I (High Sensitivity): 349 ng/L (ref <18)

## 2024-01-06 LAB — GLUCOSE, CAPILLARY
Glucose-Capillary: 134 mg/dL — ABNORMAL HIGH (ref 70–99)
Glucose-Capillary: 154 mg/dL — ABNORMAL HIGH (ref 70–99)
Glucose-Capillary: 180 mg/dL — ABNORMAL HIGH (ref 70–99)

## 2024-01-06 LAB — T4, FREE: Free T4: 1.3 ng/dL — ABNORMAL HIGH (ref 0.61–1.12)

## 2024-01-06 LAB — MRSA NEXT GEN BY PCR, NASAL: MRSA by PCR Next Gen: NOT DETECTED

## 2024-01-06 LAB — TSH: TSH: 4.483 u[IU]/mL (ref 0.350–4.500)

## 2024-01-06 LAB — PHOSPHORUS: Phosphorus: 4.1 mg/dL (ref 2.5–4.6)

## 2024-01-06 LAB — HEPARIN LEVEL (UNFRACTIONATED): Heparin Unfractionated: 0.92 [IU]/mL — ABNORMAL HIGH (ref 0.30–0.70)

## 2024-01-06 MED ORDER — HEPARIN (PORCINE) 25000 UT/250ML-% IV SOLN
1450.0000 [IU]/h | INTRAVENOUS | Status: DC
  Administered 2024-01-06: 1100 [IU]/h via INTRAVENOUS
  Administered 2024-01-07: 1450 [IU]/h via INTRAVENOUS
  Filled 2024-01-06 (×2): qty 250

## 2024-01-06 MED ORDER — AMIODARONE HCL IN DEXTROSE 360-4.14 MG/200ML-% IV SOLN
30.0000 mg/h | INTRAVENOUS | Status: DC
  Administered 2024-01-06 – 2024-01-07 (×2): 30 mg/h via INTRAVENOUS
  Filled 2024-01-06 (×3): qty 200

## 2024-01-06 MED ORDER — INSULIN ASPART 100 UNIT/ML IJ SOLN
0.0000 [IU] | INTRAMUSCULAR | Status: DC
  Administered 2024-01-06 (×2): 3 [IU] via SUBCUTANEOUS
  Administered 2024-01-07: 2 [IU] via SUBCUTANEOUS
  Administered 2024-01-07: 3 [IU] via SUBCUTANEOUS
  Administered 2024-01-07: 2 [IU] via SUBCUTANEOUS

## 2024-01-06 MED ORDER — FUROSEMIDE 10 MG/ML IJ SOLN
40.0000 mg | Freq: Once | INTRAMUSCULAR | Status: AC
  Administered 2024-01-06: 40 mg via INTRAVENOUS
  Filled 2024-01-06: qty 4

## 2024-01-06 MED ORDER — SODIUM CHLORIDE 0.9 % IV BOLUS
1000.0000 mL | Freq: Once | INTRAVENOUS | Status: AC
  Administered 2024-01-06: 1000 mL via INTRAVENOUS

## 2024-01-06 MED ORDER — ROSUVASTATIN CALCIUM 20 MG PO TABS
20.0000 mg | ORAL_TABLET | Freq: Every evening | ORAL | Status: DC
  Administered 2024-01-06 – 2024-01-11 (×6): 20 mg via ORAL
  Filled 2024-01-06 (×6): qty 1

## 2024-01-06 MED ORDER — MEMANTINE HCL 10 MG PO TABS
10.0000 mg | ORAL_TABLET | Freq: Two times a day (BID) | ORAL | Status: DC
  Administered 2024-01-06 – 2024-01-12 (×13): 10 mg via ORAL
  Filled 2024-01-06 (×14): qty 1

## 2024-01-06 MED ORDER — DONEPEZIL HCL 10 MG PO TABS
10.0000 mg | ORAL_TABLET | Freq: Every day | ORAL | Status: DC
  Administered 2024-01-06 – 2024-01-11 (×6): 10 mg via ORAL
  Filled 2024-01-06 (×7): qty 1

## 2024-01-06 MED ORDER — POTASSIUM CHLORIDE 10 MEQ/100ML IV SOLN
10.0000 meq | INTRAVENOUS | Status: AC
  Administered 2024-01-06 (×3): 10 meq via INTRAVENOUS
  Filled 2024-01-06 (×3): qty 100

## 2024-01-06 MED ORDER — FENOFIBRATE 160 MG PO TABS
160.0000 mg | ORAL_TABLET | Freq: Every morning | ORAL | Status: DC
  Administered 2024-01-06 – 2024-01-12 (×7): 160 mg via ORAL
  Filled 2024-01-06 (×7): qty 1

## 2024-01-06 MED ORDER — POTASSIUM CHLORIDE CRYS ER 20 MEQ PO TBCR
40.0000 meq | EXTENDED_RELEASE_TABLET | Freq: Once | ORAL | Status: AC
  Administered 2024-01-06: 40 meq via ORAL
  Filled 2024-01-06: qty 2

## 2024-01-06 MED ORDER — NITROGLYCERIN 0.4 MG SL SUBL: 0.4000 mg | SUBLINGUAL_TABLET | SUBLINGUAL | Status: DC | PRN

## 2024-01-06 MED ORDER — TAMSULOSIN HCL 0.4 MG PO CAPS
0.4000 mg | ORAL_CAPSULE | Freq: Every day | ORAL | Status: DC
  Administered 2024-01-06 – 2024-01-11 (×6): 0.4 mg via ORAL
  Filled 2024-01-06 (×6): qty 1

## 2024-01-06 MED ORDER — AZITHROMYCIN 250 MG PO TABS
250.0000 mg | ORAL_TABLET | Freq: Every day | ORAL | Status: DC
  Administered 2024-01-06: 250 mg via ORAL
  Filled 2024-01-06 (×2): qty 1

## 2024-01-06 MED ORDER — AMIODARONE HCL IN DEXTROSE 360-4.14 MG/200ML-% IV SOLN
60.0000 mg/h | INTRAVENOUS | Status: AC
  Administered 2024-01-06: 60 mg/h via INTRAVENOUS

## 2024-01-06 MED ORDER — ONDANSETRON HCL 4 MG/2ML IJ SOLN: 4.0000 mg | Freq: Four times a day (QID) | INTRAMUSCULAR | Status: DC | PRN

## 2024-01-06 MED ORDER — ONDANSETRON HCL 4 MG/2ML IJ SOLN
INTRAMUSCULAR | Status: AC
  Filled 2024-01-06: qty 2

## 2024-01-06 MED ORDER — ACETAMINOPHEN 325 MG PO TABS
650.0000 mg | ORAL_TABLET | ORAL | Status: DC | PRN
  Administered 2024-01-11 – 2024-01-12 (×2): 650 mg via ORAL
  Filled 2024-01-06 (×2): qty 2

## 2024-01-06 MED ORDER — ONDANSETRON HCL 4 MG/2ML IJ SOLN
4.0000 mg | Freq: Once | INTRAMUSCULAR | Status: AC
  Administered 2024-01-06: 4 mg via INTRAVENOUS

## 2024-01-06 MED ORDER — LEVOTHYROXINE SODIUM 50 MCG PO TABS
50.0000 ug | ORAL_TABLET | Freq: Every morning | ORAL | Status: DC
  Administered 2024-01-08 – 2024-01-12 (×5): 50 ug via ORAL
  Filled 2024-01-06 (×6): qty 1

## 2024-01-06 MED ORDER — ASPIRIN 81 MG PO TBEC
81.0000 mg | DELAYED_RELEASE_TABLET | Freq: Every day | ORAL | Status: DC
  Administered 2024-01-07 – 2024-01-12 (×6): 81 mg via ORAL
  Filled 2024-01-06 (×6): qty 1

## 2024-01-06 MED ORDER — MAGNESIUM SULFATE 2 GM/50ML IV SOLN: 2.0000 g | Freq: Once | INTRAVENOUS | Status: DC

## 2024-01-06 MED ORDER — SERTRALINE HCL 50 MG PO TABS
25.0000 mg | ORAL_TABLET | Freq: Every day | ORAL | Status: DC
  Administered 2024-01-06 – 2024-01-12 (×7): 25 mg via ORAL
  Filled 2024-01-06 (×7): qty 1

## 2024-01-06 NOTE — ED Notes (Signed)
 CCMD contacted

## 2024-01-06 NOTE — ED Provider Notes (Signed)
 [place] EMERGENCY DEPARTMENT AT [facility] Provider Note  CSN: [id number] Arrival date & time: 01/06/24 0825  Chief Complaint(s) V tach  HPI Patrick Miranda Head is a 78 y.o. male with past medical history as below, significant for CABG 2021, OSA on CPAP, afib, dementia, HTN, HOH, ischemic cardiomyopathy, PE who presents to the ED with complaint of vtach/unresponsive   Patient here from home via EMS, patient was unresponsive this morning.  On MS arrival his found to be in V. tach, he underwent electrical cardioversion x 2 with return to sinus rhythm, started on lidocaine  infusion by EMS and brought to the ER.  Patient does not recall the events leading up to his presentation this morning, he remembers going to bed last night.  Reports that he feels okay this morning just tired.  No chest pain or dyspnea.  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 s/p CABG in 06/2020 with LIMA-LAD, SVG-D1 and SVG-RCA   COVID-07 Dec 2019   Dementia [facility])    Depression    Essential hypertension    Hernia, umbilical    HOH (hard of hearing)    Hyperlipidemia    Ischemic cardiomyopathy    Ischemic necrosis of small bowel (HCC)    Left ventricular mural thrombus    NSTEMI (non-ST elevated myocardial infarction) (HCC)    OSA on CPAP    Pulmonary emboli [facility])    May 2021   Skin cancer    Type 2 diabetes mellitus (HCC)    Patient Active Problem List   Diagnosis Date Noted   Septic bursitis with surrounding cellulitis 06/17/2023   Acute kidney injury superimposed on CKD (HCC) 06/17/2023   Crohn disease (HCC) 06/17/2023   Lower urinary tract infectious disease    Chronic systolic HF (heart failure) (HCC)    Sepsis (HCC) 05/18/2021   BPH with obstruction/lower urinary  tract symptoms 05/14/2021   Dementia (HCC) 09/13/2020   Umbilical hernia, incarcerated 09/13/2020   Metabolic encephalopathy 09/09/2020   Abdominal pain 08/03/2020   Chronic kidney disease, stage 3a (HCC) 08/03/2020   Mixed diabetic hyperlipidemia associated with type 2 diabetes mellitus (HCC) 08/03/2020   Type 2 diabetes mellitus with hyperglycemia, with long-term current use of insulin  (HCC) 08/03/2020   Foodborne gastroenteritis 08/03/2020   SIRS (systemic inflammatory response syndrome) (HCC) 08/03/2020   Postop check 07/25/2020   Hx of CABG 07/09/2020   Coronary artery disease involving native heart 07/04/2020   Abnormal nuclear stress test    Atrial fibrillation, chronic (HCC) 12/14/2019   Hypersomnia 08/30/2019   History of adenomatous polyp of colon 05/06/2018   Vasomotor rhinitis 07/18/2017   Arthritis of knee, right 01/06/2014   CVA (cerebral vascular accident) (HCC) 09/07/2011    Class: Acute   Dysarthria 09/05/2011    Class: Acute   Primary hypertension 09/05/2011    Class: Chronic   OSA on CPAP 09/05/2011    Class: Chronic   Anemia 09/05/2011    Class: Chronic   Home Medication(s) Prior to Admission medications   Medication Sig Start Date End Date Taking? Authorizing Provider  apixaban  (ELIQUIS ) 5 MG TABS tablet TAKE 1 TABLET(5 MG) BY MOUTH TWICE DAILY 02/25/22  Yes Gerard Knight, MD  azithromycin  (ZITHROMAX ) 250 MG tablet 2  today then one daily Patient taking differently: Take 250 mg by mouth daily. 01/04/24  Yes Young, Rupert Counts D, MD  Biotin -Vitamin C (HAIR SKIN NAILS GUMMIES PO) Take 2 tablets by mouth daily.   Yes [provider]  Cyanocobalamin  (VITAMIN B-12) 5000 MCG SUBL Place 1 tablet under the tongue daily.   Yes [provider]  donepezil  (ARICEPT ) 10 MG tablet Take 1 tablet (10 mg total) by mouth at bedtime. 11/03/23  Yes McCue, Camilo Cella, NP  fenofibrate  160 MG tablet Take 160 mg by mouth every morning. 07/15/17  Yes [provider]  Ferrous Sulfate  (IRON PO) Take 1 tablet by mouth daily.   Yes [provider]  Insulin  Aspart (NOVOLOG  FLEXPEN [place]) Inject 8-10 Units into the skin 3 (three) times daily before meals. Dose per sliding scale   Yes [provider]  insulin  degludec (TRESIBA FLEXTOUCH) 100 UNIT/ML FlexTouch Pen Inject 32 Units into the skin daily.   Yes [provider]  [name]  10 MG TABS tablet Take 1 tablet (10 mg total) by mouth daily. Patient taking differently: Take 10 mg by mouth every morning. 01/05/24  Yes Gerard Knight, MD  levothyroxine  (SYNTHROID ) 50 MCG tablet Take 50 mcg by mouth every morning.   Yes [provider]  losartan  (COZAAR ) 25 MG tablet TAKE 1/2 TABLET(12.5 MG) BY MOUTH DAILY Patient taking differently: Take 12.5 mg by mouth every morning. 12/18/21  Yes Gerard Knight, MD  memantine  (NAMENDA ) 10 MG tablet Take 1 tablet (10 mg total) by mouth 2 (two) times daily. 11/03/23  Yes Johny Nap, NP  metFORMIN  (GLUCOPHAGE ) 500 MG tablet Take 500 mg by mouth 2 (two) times daily with a meal. 10/10/20  Yes [provider]  Multiple Vitamin (MULTIVITAMIN WITH MINERALS) TABS tablet Take 1 tablet by mouth daily.   Yes [provider]  rosuvastatin  (CRESTOR ) 20 MG tablet TAKE 1 TABLET(20 MG) BY MOUTH DAILY Patient taking differently: Take 20 mg by mouth every evening. TAKE 1 TABLET(20 MG) BY MOUTH DAILY 08/17/23  Yes Gerard Knight, MD  sertraline  (ZOLOFT ) 25 MG tablet Take 1 tablet (25 mg total) by mouth daily. 11/03/23  Yes McCue, Camilo Cella, NP  tamsulosin  (FLOMAX ) 0.4 MG CAPS capsule Take 0.4 mg by mouth at bedtime. 05/07/22  Yes [provider]  Vedolizumab  (ENTYVIO  IV) Inject into the vein every 28 (twenty-eight) days.   Yes [provider]  BD PEN NEEDLE NANO U/F 32G X 4 MM MISC 1 each by Other route daily.  03/19/18   [provider]  Continuous Glucose Sensor (FREESTYLE LIBRE 3 PLUS SENSOR) MISC by  Does not apply route. Change sensor every 15 days.    [provider]                                                                                                                                    Past Surgical History Past Surgical  History:  Procedure Laterality Date   COLON SURGERY     COLONOSCOPY W/ POLYPECTOMY     CORONARY ARTERY BYPASS GRAFT N/A 07/09/2020   Procedure: CORONARY ARTERY BYPASS GRAFTING (CABG) TIMES THREE , USING LEFT INTERNAL MAMMARY ARTERY AND RIGHT LEG GREATER SAPHENOUS VEIN HARVESTED ENDOSCOPICALLY;  Surgeon: Heriberto London, MD;  Location: [facility] OR;  Service: Open Heart Surgery;  Laterality: N/A;   LEFT HEART CATH AND CORONARY ANGIOGRAPHY N/A 07/04/2020   Procedure: LEFT HEART CATH AND CORONARY ANGIOGRAPHY;  Surgeon: Millicent Ally, MD;  Location: [facility] INVASIVE CV LAB;  Service: Cardiovascular;  Laterality: N/A;   TEE WITHOUT CARDIOVERSION N/A 07/09/2020   Procedure: TRANSESOPHAGEAL ECHOCARDIOGRAM (TEE);  Surgeon: Matt Song, Donata Fryer, MD;  Location: [facility] OR;  Service: Open Heart Surgery;  Laterality: N/A;   TOTAL KNEE ARTHROPLASTY Left 04/22/2013   Procedure: TOTAL KNEE ARTHROPLASTY- left;  Surgeon: Lorriane Rote, MD;  Location: [facility] OR;  Service: Orthopedics;  Laterality: Left;  with femoral block   TOTAL KNEE ARTHROPLASTY Right 01/06/2014   Procedure: RIGHT TOTAL KNEE ARTHROPLASTY;  Surgeon: Lorriane Rote, MD;  Location: [facility] OR;  Service: Orthopedics;  Laterality: Right;   TRANSURETHRAL RESECTION OF PROSTATE N/A 05/14/2021   Procedure: TRANSURETHRAL RESECTION OF THE PROSTATE (TURP);  Surgeon: Roxane Copp, MD;  Location: [facility] ORS;  Service: Urology;  Laterality: N/A;  90 MINS   Family History Family History  Problem Relation Age of Onset   Alzheimer's disease Mother    CAD Sister    Alzheimer's disease Sister    CAD Brother        Premature   Alzheimer's disease Brother     Social History Social History   Tobacco Use   Smoking status: Never    Smokeless tobacco: Never  Vaping Use   Vaping status: Never Used  Substance Use Topics   Alcohol  use: No   Drug use: No   Allergies Atorvastatin and Sitagliptin  Review of Systems A thorough review of systems was obtained and all systems are negative except as noted in the HPI and PMH.   Physical Exam Vital Signs  I have reviewed the triage vital signs BP (!) 114/50   Pulse (!) 48   Temp (!) 96.5 F (35.8 C) (Tympanic)   Resp 10   Ht 5' 10 (1.778 m)   Wt 79.4 kg   SpO2 100%   BMI 25.11 kg/m  Physical Exam Vitals and nursing note reviewed.  Constitutional:      General: He is not in acute distress.    Appearance: He is well-developed. He is ill-appearing.  HENT:     Head: Normocephalic and atraumatic.     Right Ear: External ear normal.     Left Ear: External ear normal.     Mouth/Throat:     Mouth: Mucous membranes are moist.   Eyes:     General: No scleral icterus.   Cardiovascular:     Rate and Rhythm: Normal rate and regular rhythm.     Pulses: Normal pulses.     Heart sounds: Normal heart sounds.  Pulmonary:     Effort: Pulmonary effort is normal. No respiratory distress.     Breath sounds: Normal breath sounds.  Chest:   Abdominal:     General: Abdomen is flat.     Palpations: Abdomen is soft.     Tenderness: There is no abdominal tenderness.   Musculoskeletal:     Cervical back: No rigidity.     Right  lower leg: No edema.     Left lower leg: No edema.   Skin:    General: Skin is warm and dry.     Capillary Refill: Capillary refill takes less than 2 seconds.     Coloration: Skin is pale.   Neurological:     Mental Status: He is alert.   Psychiatric:        Mood and Affect: Mood normal.        Behavior: Behavior normal.     ED Results and Treatments Labs (all labs ordered are listed, but only abnormal results are displayed) Labs Reviewed  COMPREHENSIVE METABOLIC PANEL WITH GFR - Abnormal; Notable for the following components:       Result Value   Potassium 3.0 (*)    Chloride 113 (*)    CO2 19 (*)    Glucose, Bld 232 (*)    Creatinine, Ser 1.74 (*)    Calcium  8.5 (*)    Total Protein 5.5 (*)    Albumin  2.8 (*)    AST 45 (*)    GFR, Estimated 40 (*)    All other components within normal limits  CBC WITH DIFFERENTIAL/PLATELET - Abnormal; Notable for the following components:   RBC 3.93 (*)    Hemoglobin 11.2 (*)    HCT 36.8 (*)    Lymphs Abs 5.7 (*)    All other components within normal limits  T4, FREE - Abnormal; Notable for the following components:   Free T4 1.30 (*)    All other components within normal limits  I-STAT VENOUS BLOOD GAS, ED - Abnormal; Notable for the following components:   Acid-base deficit 4.0 (*)    Sodium 147 (*)    HCT 31.0 (*)    Hemoglobin 10.5 (*)    All other components within normal limits  TROPONIN I (HIGH SENSITIVITY) - Abnormal; Notable for the following components:   Troponin I (High Sensitivity) 26 (*)    All other components within normal limits  MAGNESIUM   PHOSPHORUS  TSH  PATHOLOGIST SMEAR REVIEW  TROPONIN I (HIGH SENSITIVITY)                                                                                                                          Radiology DG Chest Port 1 View Result Date: 01/06/2024 CLINICAL DATA:  V-tach. EXAM: PORTABLE CHEST 1 VIEW COMPARISON:  04/19/2023 FINDINGS: The cardio pericardial silhouette is enlarged. There is pulmonary vascular congestion without overt pulmonary edema. Status post CABG. No acute bony abnormality. Telemetry leads overlie the chest. IMPRESSION: Enlargement of the cardiopericardial silhouette with pulmonary vascular congestion. Electronically Signed   By: Donnal Fusi M.D.   On: 01/06/2024 10:45    Pertinent labs & imaging results that were available during my care of the patient were reviewed by me and considered in my medical decision making (see MDM for details).  Medications Ordered in ED Medications  amiodarone   (NEXTERONE  PREMIX) 360-4.14 MG/200ML-% (1.8 mg/mL) IV infusion (  60 mg/hr Intravenous New Bag/Given 01/06/24 0843)  amiodarone  (NEXTERONE  PREMIX) 360-4.14 MG/200ML-% (1.8 mg/mL) IV infusion (has no administration in time range)  ondansetron  (ZOFRAN ) 4 MG/2ML injection (  Not Given 01/06/24 0857)  potassium chloride  SA (KLOR-CON  M) CR tablet 40 mEq (has no administration in time range)  potassium chloride  10 mEq in 100 mL IVPB (has no administration in time range)  sodium chloride  0.9 % bolus 1,000 mL (0 mLs Intravenous Stopped 01/06/24 1102)  ondansetron  (ZOFRAN ) injection 4 mg (4 mg Intravenous Given 01/06/24 0852)                                                                                                                                     Procedures .Critical Care  Performed by: Teddi Favors, DO Authorized by: Teddi Favors, DO   Critical care provider statement:    Critical care time (minutes):  49   Critical care time was exclusive of:  Separately billable procedures and treating other patients   Critical care was necessary to treat or prevent imminent or life-threatening deterioration of the following conditions:  Cardiac failure   Critical care was time spent personally by me on the following activities:  Development of treatment plan with patient or surrogate, discussions with consultants, evaluation of patient's response to treatment, examination of patient, ordering and review of laboratory studies, ordering and review of radiographic studies, ordering and performing treatments and interventions, pulse oximetry, re-evaluation of patient's condition, review of old charts and obtaining history from patient or surrogate   Care discussed with: admitting provider     (including critical care time)  Medical Decision Making / ED Course    Medical Decision Making:    Patrick Miranda is a 78 y.o. male  with past medical history as below, significant for CABG 2021, OSA on CPAP,  afib, dementia, HTN, HOH, ischemic cardiomyopathy, PE who presents to the ED with complaint of vtach/unresponsive The complaint involves an extensive differential diagnosis and also carries with it a high risk of complications and morbidity.  Serious etiology was considered. Ddx includes but is not limited to: Ventricular tachycardia, ventricular fibrillation, arrhythmia, electrolyte derangement, infection, metabolic, etc.  Complete initial physical exam performed, notably the patient was in no acute distress, he is ill-appearing, pale.  NSR on telemetry.    Reviewed and confirmed nursing documentation for past medical history, family history, social history.  Vital signs reviewed.     Brief summary:  78 year old male history above including CABG and dementia, ischemic heart myopathy here with unresponsive, V. Tach Converted to sinus rhythm after cardioversion x 2 by EMS Lidocaine  drip started by EMS, will transition to Amio Will admit   Clinical Course as of 01/06/24 1204  Wed Jan 06, 2024  0959 Spoke w/ cards, will come see [SG]  1203 Creatinine(!): 1.74 Similar prev [SG]  1203 Potassium(!): 3.0 replace [SG]    Clinical Course User  Index [SG] Teddi Favors, DO      EMS rhythm strip prior to intervention revealing for wide complex tachycardia concerning for VT      Pt w/ unstable VT  Admit cardiology       Additional history obtained: -Additional history obtained from ems, family -External records from outside source obtained and reviewed including: Chart review including previous notes, labs, imaging, consultation notes including  Cardiology office notes (Dr Londa Rival) Pulm note (Dr Rupert Counts) Home meds    Lab Tests: -I ordered, reviewed, and interpreted labs.   The pertinent results include:   Labs Reviewed  COMPREHENSIVE METABOLIC PANEL WITH GFR - Abnormal; Notable for the following components:      Result Value   Potassium 3.0 (*)    Chloride 113 (*)     CO2 19 (*)    Glucose, Bld 232 (*)    Creatinine, Ser 1.74 (*)    Calcium  8.5 (*)    Total Protein 5.5 (*)    Albumin  2.8 (*)    AST 45 (*)    GFR, Estimated 40 (*)    All other components within normal limits  CBC WITH DIFFERENTIAL/PLATELET - Abnormal; Notable for the following components:   RBC 3.93 (*)    Hemoglobin 11.2 (*)    HCT 36.8 (*)    Lymphs Abs 5.7 (*)    All other components within normal limits  T4, FREE - Abnormal; Notable for the following components:   Free T4 1.30 (*)    All other components within normal limits  I-STAT VENOUS BLOOD GAS, ED - Abnormal; Notable for the following components:   Acid-base deficit 4.0 (*)    Sodium 147 (*)    HCT 31.0 (*)    Hemoglobin 10.5 (*)    All other components within normal limits  TROPONIN I (HIGH SENSITIVITY) - Abnormal; Notable for the following components:   Troponin I (High Sensitivity) 26 (*)    All other components within normal limits  MAGNESIUM   PHOSPHORUS  TSH  PATHOLOGIST SMEAR REVIEW  TROPONIN I (HIGH SENSITIVITY)    Notable for trop mild elev, k low  EKG   EKG Interpretation Date/Time:  Wednesday January 06 2024 08:30:42 EDT Ventricular Rate:  68 PR Interval:  130 QRS Duration:  148 QT Interval:  459 QTC Calculation: 489 R Axis:   85  Text Interpretation: Sinus rhythm Nonspecific intraventricular conduction delay Anteroseptal infarct, old Minimal ST depression, diffuse leads similar to prior Confirmed by Russella Courts ([id number]) on 01/06/2024 8:53:15 AM         Imaging Studies ordered: I ordered imaging studies including cxr I independently visualized the following imaging with scope of interpretation limited to determining acute life threatening conditions related to emergency care; findings noted above I agree with the radiologist interpretation If any imaging was obtained with contrast I closely monitored patient for any possible adverse reaction a/w contrast administration in the emergency  department   Medicines ordered and prescription drug management: Meds ordered this encounter  Medications   amiodarone  (NEXTERONE  PREMIX) 360-4.14 MG/200ML-% (1.8 mg/mL) IV infusion   amiodarone  (NEXTERONE  PREMIX) 360-4.14 MG/200ML-% (1.8 mg/mL) IV infusion   sodium chloride  0.9 % bolus 1,000 mL   ondansetron  (ZOFRAN ) injection 4 mg   ondansetron  (ZOFRAN ) 4 MG/2ML injection    Louise Rowels S: cabinet override   potassium chloride  SA (KLOR-CON  M) CR tablet 40 mEq   DISCONTD: magnesium  sulfate IVPB 2 g 50 mL   potassium chloride  10 mEq in 100  mL IVPB    -I have reviewed the patients home medicines and have made adjustments as needed   Consultations Obtained: I requested consultation with the cardiology,  and discussed lab and imaging findings as well as pertinent plan    Cardiac Monitoring: The patient was maintained on a cardiac monitor.  I personally viewed and interpreted the cardiac monitored which showed an underlying rhythm of: sinus brady Continuous pulse oximetry interpreted by myself, 95% on 2L.    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 s/p CABG in 06/2020 with LIMA-LAD, SVG-D1 and SVG-RCA   COVID-07 Dec 2019   Dementia [facility])    Depression    Essential hypertension    Hernia, umbilical    HOH (hard of hearing)    Hyperlipidemia    Ischemic cardiomyopathy    Ischemic necrosis of small bowel (HCC)    Left ventricular mural thrombus    NSTEMI (non-ST elevated myocardial infarction) (HCC)    OSA on CPAP    Pulmonary emboli [facility])    May 2021   Skin cancer    Type 2  diabetes mellitus (HCC)       Dispostion: Disposition decision including need for hospitalization was considered, and patient admitted to the hospital.    Final Clinical Impression(s) / ED Diagnoses Final diagnoses:  Ventricular tachycardia (HCC)  Unresponsive  Hypokalemia        Teddi Favors, DO 01/06/24 1204

## 2024-01-06 NOTE — Progress Notes (Signed)
  EP aware of pt.   Discussed with Dr. Arlester Ladd.   Will follow up with pt for device discussion pending course, goals of care, and updated echo/ischemic eval.   Merla Starch, PA-C  01/06/2024 3:12 PM

## 2024-01-06 NOTE — Progress Notes (Signed)
   01/06/24 2259  BiPAP/CPAP/SIPAP  BiPAP/CPAP/SIPAP Pt Type Adult  BiPAP/CPAP/SIPAP Resmed  Patient Home Machine Yes  Safety Check Completed by RT for Home Unit Yes, no issues noted  Patient Home Mask Yes  Patient Home Tubing Yes  Device Plugged into RED Power Outlet Yes    Pt has home CPAP unit at bedside, no assistance needed

## 2024-01-06 NOTE — Progress Notes (Addendum)
 PHARMACY - ANTICOAGULATION CONSULT NOTE  Pharmacy Consult for heparin  Indication: chest pain/ACS and atrial fibrillation  Allergies  Allergen Reactions   Atorvastatin Other (See Comments)    Muscle pain   Sitagliptin Other (See Comments)    Myalgias    Patient Measurements: Height: 5' 10 (177.8 cm) Weight: 79.4 kg (175 lb) IBW/kg (Calculated) : 73 HEPARIN  DW (KG): 79.4  Vital Signs: Temp: 98 F (36.7 C) (06/18 1957) Temp Source: Oral (06/18 1957) BP: 117/69 (06/18 2300) Pulse Rate: 59 (06/18 2300)  Labs: Recent Labs    01/06/24 0829 01/06/24 0908 01/06/24 1029 01/06/24 1801 01/06/24 2152  HGB 11.2* 10.5*  --   --   --   HCT 36.8* 31.0*  --   --   --   PLT 281  --   --   --   --   APTT  --   --   --   --  31  HEPARINUNFRC  --   --   --   --  0.92*  CREATININE 1.74*  --   --  1.35*  --   TROPONINIHS 26*  --  349*  --   --     Estimated Creatinine Clearance: 46.6 mL/min (A) (by C-G formula based on SCr of 1.35 mg/dL (H)).   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 s/p CABG in 06/2020 with LIMA-LAD, SVG-D1 and SVG-RCA   COVID-07 Dec 2019   Dementia [facility])    Depression    Essential hypertension    Hernia, umbilical    HOH (hard of hearing)    Hyperlipidemia    Ischemic cardiomyopathy    Ischemic necrosis of small bowel (HCC)    Left ventricular mural thrombus    NSTEMI (non-ST elevated myocardial infarction) (HCC)    OSA on CPAP    Pulmonary emboli [facility])    May 2021   Skin cancer    Type 2 diabetes mellitus (HCC)     Assessment: 28 YOM presenting with V-tach, hx of afib on Eliquis  PTA with last dose 6/17 @2100 , holding for cath.  PM: heparin  level falsely elevated and aPTT below goal at 31 seconds on 1100 units/hr. Per RN, no issues with heparin  gtt running continuously or signs/symptoms of bleeding.  Goal of Therapy:   Heparin  level 0.3-0.7 units/ml aPTT 66-102 seconds Monitor platelets by anticoagulation protocol: Yes   Plan:  Increase Heparin  gtt to 1400 units/hr F/u 8 hour aPTT F/u post cath [facility] plan, ability to transition back to Eliquis   Young Hensen, PharmD, BCPS Clinical Pharmacist 01/06/2024 11:17 PM

## 2024-01-06 NOTE — Consult Note (Signed)
 Consultation Note Date: 01/06/2024   Patient Name: Patrick Miranda  DOB: 1946/01/30  MRN: [id number]  Age / Sex: 78 y.o., male  PCP: Suan Elm, MD Referring Physician: Jann Melody,*  Reason for Consultation: Establishing goals of care  HPI/Patient Profile: 78 y.o. male  with past medical history of dementia, brainstem CVA, CABG 2021, HTN, HLD, paroxysmal atrial fibrillation, ischemia cardiomyopathy, history of PE, OSA aon CPAP, DM2, depression admitted on 01/06/2024 with pulseless VT resuscitated with two shocks.   Clinical Assessment and Goals of Care: Consult received and chart review completed. Noted plans for cardiac catheterization and concerns for potential defibrillator in the setting of moderate dementia. I met today with Mr. Amburn along with his wife and son at bedside. Mr. Brister is lying on stretcher - no complaints. Family reports that he never complains and never worries. They share that he has moderate dementia but has done overall well but requires more assistance these days. They report he sleeps well at night but sleeps often throughout the day as well. No very active which is very different from his previous baseline where he was extremely active. Son also notes weight loss. Noted decline in activity and increased time sleeping gradually worsening since CABG 2021.   I spent time reviewing labs and PCXR with wife and son. We all reviewed plans for cardiac catheterization planned for tomorrow. Awaiting echocardiogram. Explained that heart failure team as well as electrophysiology team will be following to help put all the pieces of the puzzle together from the tests to help us  know options moving forward. I discussed with them my role with palliative care to ensure they are getting information to make good decisions and support to them through this process. They appreciate the  support.   Mr. Ingber and his wife have been married > 50 years. She plans to stay with him at his bedside throughout hospitalization as she has always done before. They have a daughter and son. Son is IT sales professional. Daughter and granddaughter were with wife when Mr. Schriefer had VT. Granddaughter has had some nursing courses but they are all glad that he did not require CPR - they understand that we would have likely been in a much different position if that had been the case. Wife is still shaken by the events of today. Reassurance provided that he is where he needs to be and we will keep talking to know the best path forward.   All questions/concerns addressed. Emotional support provided.   Primary Decision Maker NEXT OF KIN wife [name]    SUMMARY OF RECOMMENDATIONS   - Ongoing palliative conversations  Code Status/Advance Care Planning: Full code - will discuss further   Symptom Management:  Per cardiology, heart failure, EP  Prognosis:  To be determined.   Discharge Planning: To Be Determined      Primary Diagnoses: Present on Admission:  V-tach [facility])   I have reviewed the medical record, interviewed the patient and family, and examined the patient. The following aspects are pertinent.  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 s/p CABG in 06/2020 with LIMA-LAD, SVG-D1 and SVG-RCA   COVID-07 Dec 2019   Dementia [facility])    Depression    Essential hypertension    Hernia, umbilical    HOH (hard of hearing)    Hyperlipidemia    Ischemic cardiomyopathy    Ischemic necrosis of small bowel (HCC)    Left ventricular mural thrombus    NSTEMI (non-ST elevated myocardial infarction) (HCC)    OSA on CPAP    Pulmonary emboli [facility])    May 2021   Skin cancer    Type 2 diabetes mellitus (HCC)    Social History   Socioeconomic History   Marital status: Married     Spouse name: [name]   Number of children: Not on file   Years of education: Not on file   Highest education level: Not on file  Occupational History   Not on file  Tobacco Use   Smoking status: Never   Smokeless tobacco: Never  Vaping Use   Vaping status: Never Used  Substance and Sexual Activity   Alcohol  use: No   Drug use: No   Sexual activity: Not on file  Other Topics Concern   Not on file  Social History Narrative   Lives with wife at Daughter's home   Right Handed   Drinks very little caffeine daily   Retired    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 Strain: Not on file  Food Insecurity: No Food Insecurity (06/17/2023)   Hunger Vital Sign    Worried About Running Out of Food in the Last Year: Never true    Ran Out of Food in the Last Year: Never true  Transportation Needs: No Transportation Needs (06/17/2023)   PRAPARE - Administrator, Civil Service (Medical): No    Lack of Transportation (Non-Medical): No  Physical Activity: Not on file  Stress: Not on file  Social Connections: Unknown (11/29/2021)   Received from [facility]   Social Network    Social Network: Not on file   Family History  Problem Relation Age of Onset   Alzheimer's disease Mother    CAD Sister    Alzheimer's disease Sister    CAD Brother        Premature   Alzheimer's disease Brother    Scheduled Meds:  [START ON 01/07/2024] aspirin  EC  81 mg Oral Daily   azithromycin   250 mg Oral Daily   donepezil   10 mg Oral QHS   fenofibrate   160 mg Oral q morning   [START ON 01/07/2024] levothyroxine   50 mcg Oral q morning   memantine   10 mg Oral BID   ondansetron        rosuvastatin   20 mg Oral QPM   sertraline   25 mg Oral Daily   tamsulosin   0.4 mg Oral QHS   Continuous Infusions:  amiodarone  60 mg/hr (01/06/24 0843)   amiodarone      heparin  1,100 Units/hr (01/06/24 1355)   potassium chloride  10 mEq (01/06/24 1328)   PRN Meds:.acetaminophen , nitroGLYCERIN ,  ondansetron , ondansetron  (ZOFRAN ) IV Allergies  Allergen Reactions   Atorvastatin Other (See Comments)    Muscle pain   Sitagliptin Other (See Comments)    Myalgias   Review of Systems  Unable to perform ROS: Dementia  Physical Exam Vitals and nursing note reviewed.  Constitutional:      General: He is not in acute distress.    Appearance: Normal appearance.   Cardiovascular:     Rate and Rhythm: Bradycardia present.  Pulmonary:     Effort: No tachypnea, accessory muscle usage or respiratory distress.  Abdominal:     Palpations: Abdomen is soft.   Neurological:     Mental Status: He is alert and oriented to person, place, and time.     Vital Signs: BP (!) 122/50   Pulse (!) 47   Temp (!) 97.1 F (36.2 C) (Temporal)   Resp 16   Ht 5' 10 (1.778 m)   Wt 79.4 kg   SpO2 97%   BMI 25.11 kg/m  Pain Scale: 0-10   Pain Score: 0-No pain   SpO2: SpO2: 97 % O2 Device:SpO2: 97 % O2 Flow Rate: .O2 Flow Rate (L/min): 2 L/min  IO: Intake/output summary:  Intake/Output Summary (Last 24 hours) at 01/06/2024 1403 Last data filed at 01/06/2024 1102 Gross per 24 hour  Intake 1250 ml  Output 0 ml  Net 1250 ml    LBM:   Baseline Weight: Weight: 79.4 kg Most recent weight: Weight: 79.4 kg     Palliative Assessment/Data:    Time Total: 80 min  Greater than 50%  of this time was spent counseling and coordinating care related to the above assessment and plan.  Signed by: Vila Grayer, NP Palliative Medicine Team Pager # [phone] (M-F 8a-5p) Team Phone # [phone] (Nights/Weekends)

## 2024-01-06 NOTE — H&P (Addendum)
 Cardiology Admission History and Physical   Patient ID: Montell Leopard Powe MRN: [id number]; DOB: [birth year]   Admission date: 01/06/2024  PCP:  Suan Elm, MD   [facility] Providers Cardiologist:  Teddie Favre, MD    Chief Complaint:  Ventricular Tachycardia   Patient Profile: Kendre Jacinto Hermans is a 78 y.o. male with a past medical history of CABG in 2021, dementia, depression, HTN, HLD, paroxysmal atrial fibrillation, ischemic cardiomyopathy, history of PE, OSA on CPAP, type 2 DM, history of brainstem CVA who is being seen 01/06/2024 for the evaluation of Ventricular Tachycardia .  History of Present Illness: Mr. Sobocinski is a 78 year old male with above medical history who is followed by Dr. Londa Rival. Patient previously underwent nuclear stress test on 06/28/20 that was an intermediate risk study with a moderate sized, severe intensity, apical to basal inferior defect that was partially reversible. He underwent LHC on 07/04/20 that showed severe coronary obstructive disease with significant calcification involving the LAD. The LAD diffusely diseased. There was 95% stenosis in the ostium of the first diagonal, 90% stenosis in the second diagonal. There was also 95% focal mid RCA stenosis. He was seen by CT surgery and ultimately underwent CABG with LIMA-LAD, SVG-D1, and SVG-RCA. Echocardiogram prior to CABG in 06/2020 showed EF 45-50% with wall motion abnormalities, grade I DD, normal RV systolic function  Later, echocardiogram in 07/2020 showed a probable LV apical thrombus, EF 45% with unchanged wall motion abnormalities, mild MR, mild AI. Repeat echo in 08/2020 showed that the apical thrombus was no longer present.   His most recent echo from 06/2022 showed EF 45-50% with regional wall motion abnormalities, grade I DD, normal RV systolic function, normal PA systolic pressure, mild MR, mild AI. He was last seen by Dr. Londa Rival on 11/13/23. At that time, patient was doing  well. Remained on jardiance  10 mg daily, Cozaar  12.5 mg daily. He was taken off toprol  XL due to bradycardia. Remained on eliquis  for PAF and crestor  for CAD.   Patient was brought to the [facility] ED by EMS on 6/18. Patient had become unresponsive while sitting on the couch with family. EMS found him lethargic but able to follow simple commands. He was found to be in [place], cardioverted x2, started on lido gtt. Upon arrival to the ED, vital signs showed BP 109/57, oxygen  97% on [place], HR 58 BPM. Initial EKG in the ED showed NSR with HR 68 BPM, nonspecific IVCD. CXR showed enlargement of the cardiopericardial silhouette with pulmonary vascular congestion   Labs in the ED significant for  - K 3.0  - Creatinine 1.74 - albumin  2.8 - Mag 2.0 - Hemoglobin 11.2 - Platelets 281 - Initial hsTn 26. Repeat pending   Patient seen in the ED with is wife at bedside. Patient does not remember most of the events from this AM, so his wife provides most of the history. Reports that they were getting ready to leave the house when the patient suddenly fell back onto the cough. He was unconscious. His wife tried to shake him and yell his name to wake him up, but he did not wake up. Their granddaughter, a Theatre stage manager, was with them and tried to find a pulse but was unable to. They called EMS. When EMS arrived, they were also unable to find a pulse. They shocked him twice and he woke up. Currently feeling well in the ED. Denies any chest pain or shortness of breath. Denies recently having any chest  pain, DOE, dizziness, syncope, near syncope. He did have an upper respiratory infection a few weeks ago and he continues to have a cough.   Patient does have dementia, but is alert and oriented to person, place, and time. Discussed code status with patient and his wife. They both reported that patient was full code.     Past Medical History:  Diagnosis Date   Arthritis    Brain stem stroke syndrome 08/2011   Cerebral  artery disease    Coronary artery disease    a. s/p CABG in 06/2020 with LIMA-LAD, SVG-D1 and SVG-RCA   COVID-07 Dec 2019   Dementia [facility])    Depression    Essential hypertension    Hernia, umbilical    HOH (hard of hearing)    Hyperlipidemia    Ischemic cardiomyopathy    Ischemic necrosis of small bowel (HCC)    Left ventricular mural thrombus    NSTEMI (non-ST elevated myocardial infarction) (HCC)    OSA on CPAP    Pulmonary emboli [facility])    May 2021   Skin cancer    Type 2 diabetes mellitus (HCC)    Past Surgical History:  Procedure Laterality Date   COLON SURGERY     COLONOSCOPY W/ POLYPECTOMY     CORONARY ARTERY BYPASS GRAFT N/A 07/09/2020   Procedure: CORONARY ARTERY BYPASS GRAFTING (CABG) TIMES THREE , USING LEFT INTERNAL MAMMARY ARTERY AND RIGHT LEG GREATER SAPHENOUS VEIN HARVESTED ENDOSCOPICALLY;  Surgeon: Heriberto London, MD;  Location: [name] OR;  Service: Open Heart Surgery;  Laterality: N/A;   LEFT HEART CATH AND CORONARY ANGIOGRAPHY N/A 07/04/2020   Procedure: LEFT HEART CATH AND CORONARY ANGIOGRAPHY;  Surgeon: Millicent Ally, MD;  Location: [facility] INVASIVE CV LAB;  Service: Cardiovascular;  Laterality: N/A;   TEE WITHOUT CARDIOVERSION N/A 07/09/2020   Procedure: TRANSESOPHAGEAL ECHOCARDIOGRAM (TEE);  Surgeon: Matt Song, Donata Fryer, MD;  Location: [facility] OR;  Service: Open Heart Surgery;  Laterality: N/A;   TOTAL KNEE ARTHROPLASTY Left 04/22/2013   Procedure: TOTAL KNEE ARTHROPLASTY- left;  Surgeon: Lorriane Rote, MD;  Location: [facility] OR;  Service: Orthopedics;  Laterality: Left;  with femoral block   TOTAL KNEE ARTHROPLASTY Right 01/06/2014   Procedure: RIGHT TOTAL KNEE ARTHROPLASTY;  Surgeon: Lorriane Rote, MD;  Location: [facility] OR;  Service: Orthopedics;  Laterality: Right;   TRANSURETHRAL RESECTION OF PROSTATE N/A 05/14/2021   Procedure: TRANSURETHRAL RESECTION OF THE PROSTATE (TURP);  Surgeon: Roxane Copp, MD;  Location: [facility] ORS;  Service: Urology;  Laterality: N/A;  90  MINS     Medications Prior to Admission: Prior to Admission medications   Medication Sig Start Date End Date Taking? Authorizing Provider  apixaban  (ELIQUIS ) 5 MG TABS tablet TAKE 1 TABLET(5 MG) BY MOUTH TWICE DAILY 02/25/22  Yes Gerard Knight, MD  azithromycin  (ZITHROMAX ) 250 MG tablet 2 today then one daily Patient taking differently: Take 250 mg by mouth daily. 01/04/24  Yes Young, Rupert Counts D, MD  Biotin -Vitamin C (HAIR SKIN NAILS GUMMIES PO) Take 2 tablets by mouth daily.   Yes [provider]  Cyanocobalamin  (VITAMIN B-12) 5000 MCG SUBL Place 1 tablet under the tongue daily.   Yes [provider]  donepezil  (ARICEPT ) 10 MG tablet Take 1 tablet (10 mg total) by mouth at bedtime. 11/03/23  Yes McCue, Camilo Cella, NP  fenofibrate  160 MG tablet Take 160 mg by mouth every morning. 07/15/17  Yes [provider]  Ferrous Sulfate  (IRON PO) Take 1 tablet  by mouth daily.   Yes [provider]  Insulin  Aspart (NOVOLOG  FLEXPEN [place]) Inject 8-10 Units into the skin 3 (three) times daily before meals. Dose per sliding scale   Yes [provider]  insulin  degludec (TRESIBA FLEXTOUCH) 100 UNIT/ML FlexTouch Pen Inject 32 Units into the skin daily.   Yes [provider]  [name]  10 MG TABS tablet Take 1 tablet (10 mg total) by mouth daily. Patient taking differently: Take 10 mg by mouth every morning. 01/05/24  Yes Gerard Knight, MD  levothyroxine  (SYNTHROID ) 50 MCG tablet Take 50 mcg by mouth every morning.   Yes [provider]  losartan  (COZAAR ) 25 MG tablet TAKE 1/2 TABLET(12.5 MG) BY MOUTH DAILY Patient taking differently: Take 12.5 mg by mouth every morning. 12/18/21  Yes Gerard Knight, MD  memantine  (NAMENDA ) 10 MG tablet Take 1 tablet (10 mg total) by mouth 2 (two) times daily. 11/03/23  Yes Johny Nap, NP  metFORMIN  (GLUCOPHAGE ) 500 MG tablet Take 500 mg by mouth 2 (two) times daily with a meal. 10/10/20  Yes [provider]  Multiple Vitamin (MULTIVITAMIN WITH MINERALS) TABS tablet Take 1 tablet by mouth daily.   Yes [provider]  rosuvastatin  (CRESTOR ) 20 MG tablet TAKE 1 TABLET(20 MG) BY MOUTH DAILY Patient taking differently: Take 20 mg by mouth every evening. TAKE 1 TABLET(20 MG) BY MOUTH DAILY 08/17/23  Yes Gerard Knight, MD  sertraline  (ZOLOFT ) 25 MG tablet Take 1 tablet (25 mg total) by mouth daily. 11/03/23  Yes McCue, Camilo Cella, NP  tamsulosin  (FLOMAX ) 0.4 MG CAPS capsule Take 0.4 mg by mouth at bedtime. 05/07/22  Yes [provider]  Vedolizumab  (ENTYVIO  IV) Inject into the vein every 28 (twenty-eight) days.   Yes [provider]  BD PEN NEEDLE NANO U/F 32G X 4 MM MISC 1 each by Other route daily.  03/19/18   [provider]  Continuous Glucose Sensor (FREESTYLE LIBRE 3 PLUS SENSOR) MISC by Does not apply route. Change sensor every 15 days.    [provider]     Allergies:    Allergies  Allergen Reactions   Atorvastatin Other (See Comments)    Muscle pain   Sitagliptin Other (See Comments)    Myalgias    Social History:   Social History   Socioeconomic History   Marital status: Married    Spouse name: [name]   Number of children: Not on file   Years of education: Not on file   Highest education level: Not on file  Occupational History   Not on file  Tobacco Use   Smoking status: Never   Smokeless tobacco: Never  Vaping Use   Vaping status: Never Used  Substance and Sexual Activity   Alcohol  use: No   Drug use: No   Sexual activity: Not on file  Other Topics Concern   Not on file  Social History Narrative   Lives with wife at Daughter's home   Right Handed   Drinks very little caffeine daily   Retired    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 Strain: Not on file  Food Insecurity: No Food Insecurity (06/17/2023)   Hunger Vital Sign    Worried About Running Out of Food in the Last Year: Never true    Ran Out of Food  in the Last Year: Never true  Transportation Needs: No Transportation Needs (06/17/2023)   PRAPARE - Administrator, Civil Service (Medical): No  Lack of Transportation (Non-Medical): No  Physical Activity: Not on file  Stress: Not on file  Social Connections: Unknown (11/29/2021)   Received from [facility]   Social Network    Social Network: Not on file  Intimate Partner Violence: Not At Risk (06/17/2023)   Humiliation, Afraid, Rape, and Kick questionnaire    Fear of Current or Ex-Partner: No    Emotionally Abused: No    Physically Abused: No    Sexually Abused: No     Family History:   The patient's family history includes Alzheimer's disease in his brother, mother, and sister; CAD in his brother and sister.    ROS:  Please see the history of present illness.  All other ROS reviewed and negative.     Physical Exam/Data: Vitals:   01/06/24 0915 01/06/24 0945 01/06/24 1000 01/06/24 1015  BP: (!) 111/56 (!) 106/50 (!) 104/54 (!) 111/53  Pulse: (!) 58 (!) 46 (!) 48 (!) 49  Resp: 15 14 15 15   Temp:      TempSrc:      SpO2: 100% 100% 100% 100%  Weight:      Height:        Intake/Output Summary (Last 24 hours) at 01/06/2024 1108 Last data filed at 01/06/2024 1102 Gross per 24 hour  Intake 1250 ml  Output 0 ml  Net 1250 ml      01/06/2024    8:38 AM 01/04/2024    9:00 AM 11/13/2023   10:33 AM  Last 3 Weights  Weight (lbs) 175 lb 175 lb 6.4 oz 171 lb 12.8 oz  Weight (kg) 79.379 kg 79.561 kg 77.928 kg     Body mass index is 25.11 kg/m.  General:  Well nourished, well developed, in no acute distress. Sitting upright in the bed.  HEENT: normal Neck: no  JVD Vascular: Radial pulses 2+ bilaterally   Cardiac:  normal S1, S2; Regular rhythm, bradycardic; no murmur  Lungs:  clear to auscultation bilaterally, no wheezing, rhonchi or rales. Normal WOB on room air  Abd: soft, nontender  Ext: no  edema in BLE  Musculoskeletal:  No deformities  Skin: warm and  dry  Neuro:  CNs 2-12 intact, no focal abnormalities noted Psych:  Normal affect   EKG:  The ECG that was done in the ED 6/18 was personally reviewed and demonstrates NSR with HR 68 BPM, nonspecific IVCD  Relevant CV Studies: Cardiac Studies & Procedures   ______________________________________________________________________________________________ CARDIAC CATHETERIZATION  CARDIAC CATHETERIZATION 07/04/2020  Conclusion  Ost LAD to Prox LAD lesion is 40% stenosed.  2nd Diag lesion is 90% stenosed.  1st Diag-1 lesion is 95% stenosed.  1st Diag-2 lesion is 90% stenosed with 90% stenosed side branch in Lat 1st Diag.  Mid LAD-1 lesion is 90% stenosed.  Mid LAD-2 lesion is 70% stenosed.  Lat 2nd Mrg lesion is 95% stenosed.  1st Mrg lesion is 20% stenosed.  Prox RCA lesion is 95% stenosed.  Dist LAD lesion is 50% stenosed.  Severe coronary obstructive disease with significant calcification involving the LAD.  The LAD is diffusely diseased extending from the proximal to mid segment with 40% diffuse proximal stenosis, calcified 90% stenosis on a bend in the vessel in the region of the first and second diagonal vessels with 95% stenoses in the ostium of the first diagonal and 90% in the second diagonal, 70% mid stenosis and 50% mid distal stenoses.  The circumflex vessel has 20% smooth narrowing in the first marginal branch with 95% focal  stenosis in a branch of the second marginal vessel.  The RCA has a focal 95% mid stenosis with reduced antegrade distal flow secondary to competitive left-to-right collateralization.  Normal LV function with EF estimated at 55 mm; LVEDP 12 mm Hg.  RECOMMENDATION: Surgical consultation for CABG revascularization.  Patient will need to hold Plavix  and will need Plavix  washout.  Findings Coronary Findings Diagnostic  Dominance: Right  Left Anterior Descending Ost LAD to Prox LAD lesion is 40% stenosed. Mid LAD-1 lesion is 90% stenosed. Mid  LAD-2 lesion is 70% stenosed. Dist LAD lesion is 50% stenosed.  First Diagonal Branch 1st Diag-1 lesion is 95% stenosed. 1st Diag-2 lesion is 90% stenosed with 90% stenosed side branch in Lat 1st Diag.  Second Diagonal Branch 2nd Diag lesion is 90% stenosed.  Left Circumflex  First Obtuse Marginal Branch 1st Mrg lesion is 20% stenosed.  Lateral Second Obtuse Marginal Branch Lat 2nd Mrg lesion is 95% stenosed.  Right Coronary Artery Prox RCA lesion is 95% stenosed.  Right Posterior Descending Artery Collaterals RPDA filled by collaterals from Dist LAD.  Intervention  No interventions have been documented.   STRESS TESTS  NM MYOCAR MULTI W/SPECT W 06/28/2020  Narrative  No diagnostic ST segment changes with IVCD at baseline. Occasional to frequent PVCs.  Moderate sized, severe intensity, apical to basal inferior defect that is partially reversible at the apex predominantly consistent with scar and moderate peri-infarct ischemia.  This is an intermediate risk study.  Nuclear stress EF: 48%.   ECHOCARDIOGRAM  ECHOCARDIOGRAM COMPLETE 06/23/2022  Narrative ECHOCARDIOGRAM REPORT    Patient Name:   TAHJIR SILVERIA Date of Exam: 06/23/2022 Medical Rec #:  [id number]          Height:       70.5 in Accession #:    [id number]         Weight:       175.0 lb Date of Birth:  10-20-1945          BSA:          1.983 m Patient Age:    76 years           BP:           143/68 mmHg Patient Gender: M                  HR:           63 bpm. Exam Location:  [facility]  Procedure: 2D Echo, Cardiac Doppler and Color Doppler  Indications:    I25.5 (ICD-10-CM) - Ischemic cardiomyopathy  History:        Patient has prior history of Echocardiogram examinations, most recent 03/27/2021. CAD and Previous Myocardial Infarction, Prior CABG, Stroke, Arrythmias:Atrial Fibrillation; Risk Factors:Hypertension, Diabetes and Dyslipidemia. Hx of COVID-19, Dementia (HCC).  Sonographer:     Denese Finn RCS Referring Phys: [phone] SAMUEL G MCDOWELL  IMPRESSIONS   1. 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 . Left ventricular ejection fraction, by estimation, is 45 to 50%. The left ventricle has mildly decreased function. The left ventricle demonstrates regional wall motion abnormalities (see scoring diagram/findings for description). There is mild left ventricular hypertrophy. Left ventricular diastolic parameters are consistent with Grade I diastolic dysfunction (impaired relaxation). 2. Right ventricular systolic function is normal. The right ventricular size is normal. There is normal pulmonary artery systolic pressure. 3. Left atrial size was moderately dilated. 4. Right atrial size was mildly dilated. 5. The mitral valve is abnormal. Mild mitral  valve regurgitation. No evidence of mitral stenosis. 6. The aortic valve has an indeterminant number of cusps. There is mild calcification of the aortic valve. There is mild thickening of the aortic valve. Aortic valve regurgitation is mild. 7. The inferior vena cava is normal in size with greater than 50% respiratory variability, suggesting right atrial pressure of 3 mmHg.  FINDINGS Left Ventricle: The basal inferior/ inferoseptal wall is aneurysmal. Left ventricular ejection fraction, by estimation, is 45 to 50%. The left ventricle has mildly decreased function. The left ventricle demonstrates regional wall motion abnormalities. The left ventricular internal cavity size was normal in size. There is mild left ventricular hypertrophy. Left ventricular diastolic parameters are consistent with Grade I diastolic dysfunction (impaired relaxation). Normal left ventricular filling pressure.  Right Ventricle: The right ventricular size is normal. Right vetricular wall thickness was not well visualized. Right ventricular systolic function is normal. There is normal pulmonary artery systolic pressure. The tricuspid regurgitant velocity  is 2.35 m/s, and with an assumed right atrial pressure of 3 mmHg, the estimated right ventricular systolic pressure is 25.1 mmHg.  Left Atrium: Left atrial size was moderately dilated.  Right Atrium: Right atrial size was mildly dilated.  Pericardium: There is no evidence of pericardial effusion.  Mitral Valve: The mitral valve is abnormal. There is mild thickening of the mitral valve leaflet(s). There is mild calcification of the mitral valve leaflet(s). Mild mitral annular calcification. Mild mitral valve regurgitation. No evidence of mitral valve stenosis.  Tricuspid Valve: The tricuspid valve is normal in structure. Tricuspid valve regurgitation is mild . No evidence of tricuspid stenosis.  Aortic Valve: The aortic valve has an indeterminant number of cusps. There is mild calcification of the aortic valve. There is mild thickening of the aortic valve. There is mild aortic valve annular calcification. Aortic valve regurgitation is mild. Aortic regurgitation PHT measures 562 msec. Aortic valve mean gradient measures 8.1 mmHg. Aortic valve peak gradient measures 15.6 mmHg. Aortic valve area, by VTI measures 1.78 cm.  Pulmonic Valve: The pulmonic valve was not well visualized. Pulmonic valve regurgitation is not visualized. No evidence of pulmonic stenosis.  Aorta: The aortic root is normal in size and structure.  Venous: The inferior vena cava is normal in size with greater than 50% respiratory variability, suggesting right atrial pressure of 3 mmHg.  IAS/Shunts: No atrial level shunt detected by color flow Doppler.   LEFT VENTRICLE PLAX 2D LVIDd:         5.40 cm   Diastology LVIDs:         4.20 cm   LV e' medial:    5.55 cm/s LV PW:         1.10 cm   LV E/e' medial:  12.7 LV IVS:        1.00 cm   LV e' lateral:   11.30 cm/s LVOT diam:     1.90 cm   LV E/e' lateral: 6.2 LV SV:         72 LV SV Index:   36 LVOT Area:     2.84 cm   RIGHT VENTRICLE RV S prime:     9.25  cm/s TAPSE (M-mode): 1.4 cm  LEFT ATRIUM             Index        RIGHT ATRIUM           Index LA diam:        3.70 cm 1.87 cm/m   RA Area:  21.20 cm LA Vol (A2C):   93.9 ml 47.36 ml/m  RA Volume:   57.20 ml  28.85 ml/m LA Vol (A4C):   82.1 ml 41.41 ml/m LA Biplane Vol: 88.9 ml 44.84 ml/m AORTIC VALVE AV Area (Vmax):    1.70 cm AV Area (Vmean):   1.79 cm AV Area (VTI):     1.78 cm AV Vmax:           197.29 cm/s AV Vmean:          131.394 cm/s AV VTI:            0.407 m AV Peak Grad:      15.6 mmHg AV Mean Grad:      8.1 mmHg LVOT Vmax:         118.00 cm/s LVOT Vmean:        82.800 cm/s LVOT VTI:          0.255 m LVOT/AV VTI ratio: 0.63 AI PHT:            562 msec  AORTA Ao Root diam: 3.60 cm  MITRAL VALVE                TRICUSPID VALVE MV Area (PHT): 3.65 cm     TR Peak grad:   22.1 mmHg MV Decel Time: 208 msec     TR Vmax:        235.00 cm/s MR Peak grad: 98.8 mmHg MR Mean grad: 68.0 mmHg     SHUNTS MR Vmax:      497.00 cm/s   Systemic VTI:  0.26 m MR Vmean:     386.0 cm/s    Systemic Diam: 1.90 cm MV E velocity: 70.30 cm/s MV A velocity: 107.00 cm/s MV E/A ratio:  0.66  Armida Lander MD Electronically signed by Armida Lander MD Signature Date/Time: 06/23/2022/4:49:23 PM    Final   TEE  ECHO INTRAOPERATIVE TEE 07/09/2020  Narrative *INTRAOPERATIVE TRANSESOPHAGEAL REPORT *    Patient Name:   LADARRION TELFAIR Hice Date of Exam: 07/09/2020 Medical Rec #:  [id number]          Height:       70.5 in Accession #:    [id number]         Weight:       172.4 lb Date of Birth:  1945-11-08          BSA:          1.97 m Patient Age:    74 years           BP:           134/87 mmHg Patient Gender: M                  HR:           65 bpm. Exam Location:  Anesthesiology  Transesophogeal exam was perform intraoperatively during surgical procedure. Patient was closely monitored under general anesthesia during the entirety of examination.  Indications:      Coronary Artery Disease Sonographer:     Ruta Cousins RDCS Performing Phys: [id number]AN TRIGT Diagnosing Phys: Jake Mayers MD  Complications: No known complications during this procedure. POST-OP IMPRESSIONS Overall, there were no significant changes from pre-bypass.  PRE-OP FINDINGS Left Ventricle: The left ventricle has mildly reduced systolic function, with an ejection fraction of 45-50%. The cavity size was left ventricular size was not assessed. There is no increase in left ventricular wall thickness.  Right Ventricle: The right  ventricle has normal systolic function. The cavity was normal. There is right vetricular wall thickness was not assessed.  Left Atrium: Left atrial size was not assessed. The left atrial appendage is well visualized and there is no evidence of thrombus present.  Right Atrium: Right atrial size was not assessed.  Interatrial Septum: No atrial level shunt detected by color flow Doppler.  Pericardium: There is no evidence of pericardial effusion.  Mitral Valve: The mitral valve is normal in structure. Mitral valve regurgitation is trivial by color flow Doppler. The MR jet is centrally-directed.  Tricuspid Valve: The tricuspid valve was normal in structure. Tricuspid valve regurgitation is trivial by color flow Doppler.  Aortic Valve: The aortic valve is normal in structure. Aortic valve regurgitation is mild by color flow Doppler. The jet is centrally-directed.  Pulmonic Valve: The pulmonic valve was normal in structure. Pulmonic valve regurgitation is trivial by color flow Doppler.   +--------------+--------++ LEFT VENTRICLE         +--------------+--------++ PLAX 2D                +--------------+--------++ LVOT diam:    2.60 cm  +--------------+--------++ LVOT Area:    5.31 cm +--------------+--------++                        +--------------+--------++   +--------------+-------+ SHUNTS                 +--------------+-------+ Systemic Diam:2.60 cm +--------------+-------+   Jake Mayers MD Electronically signed by Jake Mayers MD Signature Date/Time: 07/09/2020/3:31:20 PM    Final        ______________________________________________________________________________________________       Laboratory Data: High Sensitivity Troponin:   Recent Labs  Lab 01/06/24 0829  TROPONINIHS 26*      Chemistry Recent Labs  Lab 01/06/24 0829 01/06/24 0842 01/06/24 0908  NA 142  --  147*  K 3.0*  --  3.7  CL 113*  --   --   CO2 19*  --   --   GLUCOSE 232*  --   --   BUN 18  --   --   CREATININE 1.74*  --   --   CALCIUM  8.5*  --   --   MG  --  2.0  --   GFRNONAA 40*  --   --   ANIONGAP 10  --   --     Recent Labs  Lab 01/06/24 0829  PROT 5.5*  ALBUMIN  2.8*  AST 45*  ALT 11  ALKPHOS 49  BILITOT 0.5   Lipids No results for input(s): CHOL, TRIG, HDL, LABVLDL, LDLCALC, CHOLHDL in the last 168 hours. Hematology Recent Labs  Lab 01/06/24 0829 01/06/24 0908  WBC 9.9  --   RBC 3.93*  --   HGB 11.2* 10.5*  HCT 36.8* 31.0*  MCV 93.6  --   MCH 28.5  --   MCHC 30.4  --   RDW 14.9  --   PLT 281  --    Thyroid   Recent Labs  Lab 01/06/24 0829  FREET4 1.30*   BNPNo results for input(s): BNP, PROBNP in the last 168 hours.  DDimer No results for input(s): DDIMER in the last 168 hours.  Radiology/Studies:  [facility] Chest Port 1 View Result Date: 01/06/2024 CLINICAL DATA:  V-tach. EXAM: PORTABLE CHEST 1 VIEW COMPARISON:  04/19/2023 FINDINGS: The cardio pericardial silhouette is enlarged. There is pulmonary vascular congestion without overt pulmonary edema. Status post CABG. No  acute bony abnormality. Telemetry leads overlie the chest. IMPRESSION: Enlargement of the cardiopericardial silhouette with pulmonary vascular congestion. Electronically Signed   By: Donnal Fusi M.D.   On: 01/06/2024 10:45     Assessment and Plan:  Vtach  -  Patient was in his usual state of health this AM when he suddenly lost consciousness and fell back onto the couch. Found to be in V-tach by EMS, shocked x2 with return of NSR. Started on lidocaine  drip  - Once arrived in the ED, he was started on IV amiodarone   - In the ED, patient has been maintaining NSR with occasional PVCs. Denies chest pain, shortness of breath, palpitations. Patient cannot recall any precipitating symptoms. Denies recent chest pain or DOE  - hsTn 26>349 - EKG in NSR today shows Qtc 489. OK to continue azithromycin . EKG ordered for tomorrow AM  - K initially 3.0. Mag 2.0. K supplementation was ordered in the ED  - Continue IV amiodarone   - Ordered echocardiogram  - Recommended cardiac catheterization to rule out ischemic causes. Patient and wife are in agreement with proceeding. He is on eliquis  at home, last dose yesterday at 8 PM. He is stable in the ED. Anticipate that cath will be tomorrow, but will confirm timing with MD  - No BB for now with patient's baseline bradycardia  - Consider EP consult pending results of above workup   CAD s/p CABG  - S/p CABG in 2021 with LIMA-LAD, SVG-D1, and SVG-RCA  - Echos since then have shown regional wall motion abnormalities. Question possible scar mediated VT? - Patient denies chest pain at this time. No recent chest pain or DOE  - Echo and cath as above  - Continue crestor  20 mg daily   Paroxysmal Atrial Fibrillation  - Currently maintaining NSR per telemetry  - Continue IV amiodarone  for VT as above  - Patient is on eliquis  5 mg BID at home. Reports compliance. Last dose was yesterday around 8 PM. Hold eliquis  pending cath, start IV heparin  per pharm consult   Hypokalemia  - K initially 3.0 in the ED - Supplementation ordered by EDP   Type 2 DM  - SSI while admitted   Risk Assessment/Risk Scores:   Code Status: Full Code  Severity of Illness: The appropriate patient status for this patient is INPATIENT. Inpatient  status is judged to be reasonable and necessary in order to provide the required intensity of service to ensure the patient's safety. The patient's presenting symptoms, physical exam findings, and initial radiographic and laboratory data in the context of their chronic comorbidities is felt to place them at high risk for further clinical deterioration. Furthermore, it is not anticipated that the patient will be medically stable for discharge from the hospital within 2 midnights of admission.   * I certify that at the point of admission it is my clinical judgment that the patient will require inpatient hospital care spanning beyond 2 midnights from the point of admission due to high intensity of service, high risk for further deterioration and high frequency of surveillance required.*  For questions or updates, please contact [facility] Please consult [email or web address] for contact info under     Signed, Debria Fang, PA-C  01/06/2024 11:08 AM   I have personally seen and examined the patient.  My HPI, Exam, and assessment and plan are below, independent of the NPP above.  Mr. Wahab is a 78 year old with coronary artery disease and ischemic cardiomyopathy who presents  with cardiac arrest after Pulses less ventricular tachycardia. He is accompanied by his wife.  He experienced a cardiac arrest with pulseless ventricular tachycardia and was successfully resuscitated with two shocks. He was found by family members, including his granddaughter, who attempted to call for help after finding no pulse. He did not received CPR. He is currently in the emergency room on IV amiodarone .  He has a history of coronary artery disease, status post cardiac catheterization in 2021, and ischemic cardiomyopathy. He also has a history of an inferior WMA and mild dementia. His last cardiac catheterization was in 2021 and prior bypass. He is on Eliquis , which he last took yesterday at 8 PM, and is on  rosuvastatin  20 mg daily for ischemic disease.  He has a history of atrial fibrillation and is on amiodarone . He also has a history of type 2 diabetes, managed with sliding scale insulin , and chronic obstructive pulmonary disease, but is not on a significant amount of inhalers. He has obstructive sleep apnea and uses CPAP. He has a history of stroke with no residual deficits.  He recently had an upper respiratory infection and was treated with azithromycin  for bronchitis. No chest pain or breathing issues currently. His wife reports that he typically sleeps until almost lunch, then spends most of the day watching TV in his recliner until late at night. He is able to eat and perform basic self-care, though he requires some assistance with showering.  He is on Namenda  and donepezil  for dementia, which is described as being in the beginning to middle stages. He is also on Zoloft  for neuropsychiatric support. His family reports that his dementia is not severe. He has a history of pulmonary embolism and significant hypoalbuminemia.  Exam notable for  Gen: no distress,   Neck: No JVD Ears:  Samuel Crock Sign Cardiac: No Rubs or Gallops, no murmur, regulary bradycardia, +2 radial pulses Respiratory: Clear to auscultation bilaterally, normal effort, normal  respiratory rate GI: Soft, nontender, non-distended  MS: No  edema;  moves all extremities Integument: Skin feels warn Neuro:  At time of evaluation, alert and oriented to person/place/time/situation  Psych: Normal, thought mildly flat, affect   Labs notable for elevated troponin and K of 3 EKG: WCT with rate ~214 bpm with RSR' with dominant R wave in V4, and rS interval 120 ms concerning for MMVT V1-V3 negative, V5-V6 positive concerning for posterior/infero-basal scar  mediated VT in the setting of known wall motion abnormalities  Tele: [facility] presently  In assessment and plan:   Cardiac Arrest: Pulseless VT s/p infield resuscitation with no  deficit Ventricular tachycardia Monomorphic VT likely originating from the inferior RV base, as suggested by EKG morphology that correlates with his known inferior hypokinesis and RCA disease with prior collaterals (2021) . Brief cardiac arrest with pulseless VT, successfully resuscitated with two shocks. Currently stable on IV amiodarone  with no chest pain or dyspnea. Telemetry shows normal sinus rhythm with occasional PVCs. No NSVT observed. QTc borderline elevated but not concerning in the context of wide QRS. Decision to admit to ICU due to VT arrest and need for close monitoring. - Admit to cardiac ICU - Continue IV amiodarone  - Repeat echocardiogram to assess for new wall motion abnormalities (limited echo) - Order EKG for tomorrow while on azithromycin ; I saw no pause dependent ectopy - Consulted Drs. Stoner ([facility]- closed unit 2H admission) and our EP team and reviewed care - MG stable K goal of 4 (BMP pending at 1700 with additional K)  Coronary artery disease Coronary artery disease with previous catheterization in 2021. Known blockages and history of triple bypass surgery. Elevated troponin levels possibly due to recent cardiac arrest rather than new ischemic event. Decision to delay heart catheterization to assess for new blockages due to current stability and lack of symptoms. Consideration of ischemic workup complicated by existing ischemic cardiomyopathy and potential scar tissue contributing to VT. - continue heparin , trend troponins; I suspect Type II NSTEMI is related to cardiac arrest not to ischemic but NPO at midnight incase cath is warranted  Ischemic cardiomyopathy Ischemic cardiomyopathy with previous diagnosis and treatment. Potential scar tissue from past myocardial damage could be contributing to VT. Current management focuses on stabilizing heart rhythm and assessing need for defibrillator. This is complicated in the setting of his mild to moderate dementia - no BB  bradycardia on amiodarone ; will not stop Aricept  or memantine  today - unclear why he is on no GDMT; echo is pending and we may start medical management  Type 2 diabetes mellitus - Implement sliding scale insulin  as needed - Hold metformin  during hospital stay - NPO tomorrow with decrease of his home sliding scale insulin   Chronic obstructive pulmonary disease (COPD) COPD, well-managed and not on extensive inhaler regimen. - no change to his current therapy - droplet precautions for bronchitis, will complete his azithromycin  course unless TDP  Obstructive sleep apnea Obstructive sleep apnea managed with CPAP. No changes to CPAP regimen during this visit.  PAF CHADSVASC 6+ History of stroke - heparin  as we evaluate the above, may return to eleiquis  Mild dementia Mild dementia with management by neurology. Family reports dementia is not severe. Discussion of dementia's impact on decision-making for potential defibrillator placement. - for now continue home therapy I have turned the lights on in [place]; we will continue sertraline ; he has no history of sundowning but is high risk - we have asked out palliative care team to see him non-urgently; Ultimately it is unclear to me his one year prognosis and this would affect decision making for and ICD.  I have reviewed this with patient, wife, and Dr. Alease Amend - he is mobile enough that if LifeVest at a back up rate of ~ 190 was ordered, he would wear it and would likely not have AF mediated shock   History of pulmonary embolism History of pulmonary embolism. Currently on anticoagulation therapy with Eliquis , transitioning to heparin  during hospital stay due to potential procedures. - Transition from Eliquis  to heparin   Full Code DM Cards Diet Heparin   At [facility] estimated Discharge Saturday as we must - evaluated whether ischemic eval is appropriate - establish Cardiac GOC - suppress VT - prevent sundowning AMS - create secondary VT  prevention plan  CRITICAL CARE Performed by: Petina Muraski A Rozann Holts  Total critical care time: 74 minutes. Critical care time was exclusive of separately billable procedures and treating other patients. Critical care was necessary to treat or prevent imminent or life-threatening deterioration. Critical care was time spent personally by me on the following activities: development of treatment plan with patient and his wife as well as nursing about K repletion, discussions with consultants, evaluation of patient's response to treatment, examination of patient, obtaining history from patient or surrogate, ordering and performing treatments and interventions, ordering and review of laboratory studies, ordering and review of radiographic studies, pulse oximetry.   Signed, Gloriann Larger, MD FASE [facility]  [facility]  01/06/2024 2:14 PM    Gloriann Larger, MD FASE [facility] Cardiologist [facility]  [facility]  [address] [place], [place] [address] [phone]  1:45 PM

## 2024-01-06 NOTE — ED Triage Notes (Signed)
 BIBEMS [place] from home at 0730 became unresponsive sitting on couch with family. EMS found him lethargic but would follolw simple commands found in vtach cardiovert x 2. 1 push dose lido, lido gtt started no loss of pulse.

## 2024-01-06 NOTE — H&P (View-Only) (Signed)
 Advanced Heart Failure Team Consult Note   Primary Physician: Suan Elm, MD Cardiologist:  Teddie Favre, MD Reason for Consultation: Ventricular Tachycardia  HPI:    Patrick Miranda is seen today for evaluation of VT at the request of Dr. Astrid Lay.   Patrick Miranda is a 78 y.o. male with a past medical history of CABG in 2021, dementia, depression, HTN, HLD, paroxysmal atrial fibrillation on Eliquis , ischemic cardiomyopathy, history of PE, OSA on CPAP, type 2 DM, history of brainstem CVA.  Nuclear stress test on 06/28/20 that was an intermediate risk study with a moderate sized, severe intensity, apical to basal inferior defect that was partially reversible. He underwent LHC on 07/04/20 that showed severe coronary obstructive disease with significant calcification involving the LAD. The LAD diffusely diseased. There was 95% stenosis in the ostium of the first diagonal, 90% stenosis in the second diagonal. There was also 95% focal mid RCA stenosis. He was seen by CT surgery and ultimately underwent CABG with LIMA-LAD, SVG-D1, and SVG-RCA. Echocardiogram prior to CABG in 06/2020 showed EF 45-50%   Last echo showed stable EF 45-50% with RWMA, G1DD, nl RV.   Presented to [facility] from EMS 01/06/24. He became unresponsive at home with family. On EMS arrival was in ventricular tachycardia. Cardioverted x2 and started on Lidocaine  gtt. In ED BP 109/57, HR 58 bpm, EKG on arrival NSR 68 bpm. CXR with pulm edema. Labs notable for K 3.0, Mg 2.0, CO2 19, BUN/Cr 18/1.78, hs-trop 26>349, WBC 9.9. Awake in ED, does not remember much of what happened. Admitting to ICU for further management. Echo ordered and pending  Lying in stretcher. Wife present. Patient had been suffering from URI/congestive cough and had seen PCP. He is now on azithromycin . Feeling better. Is not very mobile at home, wife helps with ADLs.   Home Medications Prior to Admission medications   Medication Sig Start Date End  Date Taking? Authorizing Provider  apixaban  (ELIQUIS ) 5 MG TABS tablet TAKE 1 TABLET(5 MG) BY MOUTH TWICE DAILY 02/25/22  Yes Gerard Knight, MD  azithromycin  (ZITHROMAX ) 250 MG tablet 2 today then one daily Patient taking differently: Take 250 mg by mouth daily. 01/04/24  Yes Young, Rupert Counts D, MD  Biotin -Vitamin C (HAIR SKIN NAILS GUMMIES PO) Take 2 tablets by mouth daily.   Yes [provider]  Cyanocobalamin  (VITAMIN B-12) 5000 MCG SUBL Place 1 tablet under the tongue daily.   Yes [provider]  donepezil  (ARICEPT ) 10 MG tablet Take 1 tablet (10 mg total) by mouth at bedtime. 11/03/23  Yes Johny Nap, NP  fenofibrate  160 MG tablet Take 160 mg by mouth every morning. 07/15/17  Yes [provider]  Ferrous Sulfate  (IRON PO) Take 1 tablet by mouth daily.   Yes [provider]  Insulin  Aspart (NOVOLOG  FLEXPEN [place]) Inject 8-10 Units into the skin 3 (three) times daily before meals. Dose per sliding scale   Yes [provider]  insulin  degludec (TRESIBA FLEXTOUCH) 100 UNIT/ML FlexTouch Pen Inject 32 Units into the skin daily.   Yes [provider]  [name]  10 MG TABS tablet Take 1 tablet (10 mg total) by mouth daily. Patient taking differently: Take 10 mg by mouth every morning. 01/05/24  Yes Gerard Knight, MD  levothyroxine  (SYNTHROID ) 50 MCG tablet Take 50 mcg by mouth every morning.   Yes [provider]  losartan  (COZAAR ) 25 MG tablet TAKE 1/2 TABLET(12.5 MG) BY MOUTH DAILY Patient taking differently: Take 12.5 mg  by mouth every morning. 12/18/21  Yes Gerard Knight, MD  memantine  (NAMENDA ) 10 MG tablet Take 1 tablet (10 mg total) by mouth 2 (two) times daily. 11/03/23  Yes Johny Nap, NP  metFORMIN  (GLUCOPHAGE ) 500 MG tablet Take 500 mg by mouth 2 (two) times daily with a meal. 10/10/20  Yes [provider]  Multiple Vitamin (MULTIVITAMIN WITH MINERALS) TABS tablet Take 1 tablet by mouth daily.   Yes [provider]  rosuvastatin  (CRESTOR ) 20 MG tablet TAKE 1 TABLET(20 MG) BY MOUTH DAILY Patient taking differently: Take 20 mg by mouth every evening. TAKE 1 TABLET(20 MG) BY MOUTH DAILY 08/17/23  Yes Gerard Knight, MD  sertraline  (ZOLOFT ) 25 MG tablet Take 1 tablet (25 mg total) by mouth daily. 11/03/23  Yes McCue, Camilo Cella, NP  tamsulosin  (FLOMAX ) 0.4 MG CAPS capsule Take 0.4 mg by mouth at bedtime. 05/07/22  Yes [provider]  Vedolizumab  (ENTYVIO  IV) Inject into the vein every 28 (twenty-eight) days.   Yes [provider]  BD PEN NEEDLE NANO U/F 32G X 4 MM MISC 1 each by Other route daily.  03/19/18   [provider]  Continuous Glucose Sensor (FREESTYLE LIBRE 3 PLUS SENSOR) MISC by Does not apply route. Change sensor every 15 days.    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 s/p CABG in 06/2020 with LIMA-LAD, SVG-D1 and SVG-RCA   COVID-07 Dec 2019   Dementia [facility])    Depression    Essential hypertension    Hernia, umbilical    HOH (hard of hearing)    Hyperlipidemia    Ischemic cardiomyopathy    Ischemic necrosis of small bowel (HCC)    Left ventricular mural thrombus    NSTEMI (non-ST elevated myocardial infarction) (HCC)    OSA on CPAP    Pulmonary emboli [facility])    May 2021   Skin cancer    Type 2 diabetes mellitus (HCC)     Past Surgical History: Past Surgical History:  Procedure Laterality Date   COLON SURGERY     COLONOSCOPY W/ POLYPECTOMY     CORONARY ARTERY BYPASS GRAFT N/A 07/09/2020   Procedure: CORONARY ARTERY BYPASS GRAFTING (CABG) TIMES THREE , USING LEFT INTERNAL MAMMARY ARTERY AND RIGHT LEG GREATER SAPHENOUS VEIN HARVESTED ENDOSCOPICALLY;  Surgeon: Heriberto London, MD;  Location: [facility] OR;  Service: Open Heart Surgery;  Laterality: N/A;   LEFT HEART CATH AND CORONARY  ANGIOGRAPHY N/A 07/04/2020   Procedure: LEFT HEART CATH AND CORONARY ANGIOGRAPHY;  Surgeon: Millicent Ally, MD;  Location: [facility] INVASIVE CV LAB;  Service: Cardiovascular;  Laterality: N/A;   TEE WITHOUT CARDIOVERSION N/A 07/09/2020   Procedure: TRANSESOPHAGEAL ECHOCARDIOGRAM (TEE);  Surgeon: Matt Song, Donata Fryer, MD;  Location[facility];  Service: Open Heart Surgery;  Laterality: N/A;   TOTAL KNEE ARTHROPLASTY Left 04/22/2013   Procedure: TOTAL KNEE ARTHROPLASTY- left;  Surgeon: Lorriane Rote, MD;  Location: [facility] OR;  Service: Orthopedics;  Laterality: Left;  with femoral block   TOTAL KNEE ARTHROPLASTY Right 01/06/2014   Procedure: RIGHT TOTAL KNEE ARTHROPLASTY;  Surgeon: Lorriane Rote, MD;  Location: [facility] OR;  Service: Orthopedics;  Laterality: Right;   TRANSURETHRAL RESECTION OF PROSTATE N/A 05/14/2021   Procedure: TRANSURETHRAL RESECTION OF THE PROSTATE (TURP);  Surgeon: Pace, Maryellen  D, MD;  Location: [facility] ORS;  Service: Urology;  Laterality: N/A;  90 MINS    Family History: Family History  Problem Relation Age of Onset   Alzheimer's disease Mother    CAD Sister    Alzheimer's disease Sister    CAD Brother        Premature   Alzheimer's disease Brother     Social History: Social History   Socioeconomic History   Marital status: Married    Spouse name: [name]   Number of children: Not on file   Years of education: Not on file   Highest education level: Not on file  Occupational History   Not on file  Tobacco Use   Smoking status: Never   Smokeless tobacco: Never  Vaping Use   Vaping status: Never Used  Substance and Sexual Activity   Alcohol  use: No   Drug use: No   Sexual activity: Not on file  Other Topics Concern   Not on file  Social History Narrative   Lives with wife at Daughter's home   Right Handed   Drinks very little caffeine daily   Retired    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 Strain: Not on file  Food Insecurity: No Food Insecurity  (06/17/2023)   Hunger Vital Sign    Worried About Running Out of Food in the Last Year: Never true    Ran Out of Food in the Last Year: Never true  Transportation Needs: No Transportation Needs (06/17/2023)   PRAPARE - Administrator, Civil Service (Medical): No    Lack of Transportation (Non-Medical): No  Physical Activity: Not on file  Stress: Not on file  Social Connections: Unknown (11/29/2021)   Received from [facility]   Social Network    Social Network: Not on file    Allergies:  Allergies  Allergen Reactions   Atorvastatin Other (See Comments)    Muscle pain   Sitagliptin Other (See Comments)    Myalgias    Objective:    Vital Signs:   Temp:  [96.5 F (35.8 C)-97.1 F (36.2 C)] 97.1 F (36.2 C) (06/18 1239) Pulse Rate:  [46-59] 48 (06/18 1515) Resp:  [10-21] 12 (06/18 1515) BP: (104-133)/(48-58) 125/53 (06/18 1515) SpO2:  [95 %-100 %] 98 % (06/18 1515) Weight:  [79.4 kg] 79.4 kg (06/18 0838)   Weight change: Filed Weights   01/06/24 0838  Weight: 79.4 kg   Intake/Output:  Intake/Output Summary (Last 24 hours) at 01/06/2024 1540 Last data filed at 01/06/2024 1102 Gross per 24 hour  Intake 1250 ml  Output 0 ml  Net 1250 ml    Physical Exam    General: Elderly appearing. No distress on RA Cardiac: JVP flat. S1 and S2 present. No murmurs or rub. Extremities: Warm and dry.  No peripheral edema.  Neuro: Alert and oriented x3. Affect pleasant. Moves all extremities without difficulty.  Telemetry   SB in 50s (personally reviewed)  EKG    SR 68 bpm (personally reviewed)  Labs   Basic Metabolic Panel: Recent Labs  Lab 01/06/24 0829 01/06/24 0842 01/06/24 0908  NA 142  --  147*  K 3.0*  --  3.7  CL 113*  --   --   CO2 19*  --   --   GLUCOSE 232*  --   --   BUN 18  --   --   CREATININE 1.74*  --   --   CALCIUM  8.5*  --   --  MG  --  2.0  --   PHOS  --  4.1  --    Liver Function Tests: Recent Labs  Lab 01/06/24 0829  AST  45*  ALT 11  ALKPHOS 49  BILITOT 0.5  PROT 5.5*  ALBUMIN  2.8*   CBC: Recent Labs  Lab 01/06/24 0829 01/06/24 0908  WBC 9.9  --   NEUTROABS 3.0  --   HGB 11.2* 10.5*  HCT 36.8* 31.0*  MCV 93.6  --   PLT 281  --    Medications:    Current Medications:  [START ON 01/07/2024] aspirin  EC  81 mg Oral Daily   azithromycin   250 mg Oral Daily   donepezil   10 mg Oral QHS   fenofibrate   160 mg Oral q morning   [START ON 01/07/2024] levothyroxine   50 mcg Oral q morning   memantine   10 mg Oral BID   ondansetron        rosuvastatin   20 mg Oral QPM   sertraline   25 mg Oral Daily   tamsulosin   0.4 mg Oral QHS   Infusions:  amiodarone  30 mg/hr (01/06/24 1430)   heparin  1,100 Units/hr (01/06/24 1355)   Patient Profile   Patrick Miranda is a 78 y.o. male with a past medical history of CABG in 2021, dementia, depression, HTN, HLD, paroxysmal atrial fibrillation on Eliquis , ischemic cardiomyopathy, history of PE, OSA on CPAP, type 2 DM, history of brainstem CVA. Admitted with VT.   Assessment/Plan   Ventricular Tachycardia - lost consciousness at home, witnessed. On EMS arrival, VT on monitor. Shocked x2 with return to NSR.  - EKG in ED with NSR and frequent PVCs - with ischemic history, ?scar mediated. Previously noted RWMA on prior echo - goal K>4, Mg>2 - continue lidocaine  - continue amio - not started on a beta blocker due to baseline bradycardia - consider coronary angiography for ischemic rule out once more stable - EP following, device discussion pending family/patient GOC  HFmrEF - Longstanding history of EF 45-50%. iCM. - Repeat Echo pending  CAD  - h/o CABG in 2021 (LIMA-LAD, SVG-D1, and SVG-RCA) - no chest pain - continue ASA - continue fenofibrate  + statin  PAF - in NSR on tele - continue IV amio - continue IV heparin . Prev on Eliquis   Hypokalemia - replete for goal K>4  Dementia - continue namenda  + aricept   Length of Stay: 0  CRITICAL  CARE Performed by: [place] Pheonix Clinkscale  Total critical care time: 13 minutes  -Critical care time was exclusive of separately billable procedures and treating other patients. -Critical care was necessary to treat or prevent imminent or life-threatening deterioration. -Critical care was time spent personally by me on the following activities: development of treatment plan with patient and/or surrogate as well as nursing, discussions with consultants, evaluation of patient's response to treatment, examination of patient, obtaining history from patient or surrogate, ordering and performing treatments and interventions, ordering and review of laboratory studies, ordering and review of radiographic studies, pulse oximetry and re-evaluation of patient's condition.  [place] Charlet Harr, NP  01/06/2024, 3:40 PM  Advanced Heart Failure Team Pager [phone] (M-F; 7a - 5p)  Please contact [facility] for night-coverage after hours (4p -7a ) and weekends on [email or web address]

## 2024-01-06 NOTE — Progress Notes (Signed)
 PHARMACY - ANTICOAGULATION CONSULT NOTE  Pharmacy Consult for heparin  Indication: chest pain/ACS and atrial fibrillation  Allergies  Allergen Reactions   Atorvastatin Other (See Comments)    Muscle pain   Sitagliptin Other (See Comments)    Myalgias    Patient Measurements: Height: 5' 10 (177.8 cm) Weight: 79.4 kg (175 lb) IBW/kg (Calculated) : 73 HEPARIN  DW (KG): 79.4  Vital Signs: Temp: 97.1 F (36.2 C) (06/18 1239) Temp Source: Temporal (06/18 1239) BP: 122/50 (06/18 1315) Pulse Rate: 47 (06/18 1315)  Labs: Recent Labs    01/06/24 0829 01/06/24 0908 01/06/24 1029  HGB 11.2* 10.5*  --   HCT 36.8* 31.0*  --   PLT 281  --   --   CREATININE 1.74*  --   --   TROPONINIHS 26*  --  349*    Estimated Creatinine Clearance: 36.1 mL/min (A) (by C-G formula based on SCr of 1.74 mg/dL (H)).   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 s/p CABG in 06/2020 with LIMA-LAD, SVG-D1 and SVG-RCA   COVID-07 Dec 2019   Dementia [facility])    Depression    Essential hypertension    Hernia, umbilical    HOH (hard of hearing)    Hyperlipidemia    Ischemic cardiomyopathy    Ischemic necrosis of small bowel (HCC)    Left ventricular mural thrombus    NSTEMI (non-ST elevated myocardial infarction) (HCC)    OSA on CPAP    Pulmonary emboli [facility])    May 2021   Skin cancer    Type 2 diabetes mellitus (HCC)     Assessment: 57 YOM presenting with V-tach, hx of afib on Eliquis  PTA with last dose 6/17 @2100 , holding for cath  Goal of Therapy:  Heparin  level 0.3-0.7 units/ml aPTT 66-102 seconds Monitor platelets by anticoagulation protocol: Yes   Plan:  Heparin  gtt at 1100 units/hr F/u 8 hour aPTT/HL F/u post cath [facility] plan, ability to transition  Trinidad Funk, PharmD, [facility] Clinical Pharmacist ED Pharmacist Phone # [phone] 01/06/2024 1:32  PM

## 2024-01-06 NOTE — Consult Note (Signed)
 Advanced Heart Failure Team Consult Note   Primary Physician: Suan Elm, MD Cardiologist:  Teddie Favre, MD Reason for Consultation: Ventricular Tachycardia  HPI:    Patrick Miranda is seen today for evaluation of VT at the request of Dr. Astrid Lay.   Patrick Miranda is a 78 y.o. male with a past medical history of CABG in 2021, dementia, depression, HTN, HLD, paroxysmal atrial fibrillation on Eliquis , ischemic cardiomyopathy, history of PE, OSA on CPAP, type 2 DM, history of brainstem CVA.  Nuclear stress test on 06/28/20 that was an intermediate risk study with a moderate sized, severe intensity, apical to basal inferior defect that was partially reversible. He underwent LHC on 07/04/20 that showed severe coronary obstructive disease with significant calcification involving the LAD. The LAD diffusely diseased. There was 95% stenosis in the ostium of the first diagonal, 90% stenosis in the second diagonal. There was also 95% focal mid RCA stenosis. He was seen by CT surgery and ultimately underwent CABG with LIMA-LAD, SVG-D1, and SVG-RCA. Echocardiogram prior to CABG in 06/2020 showed EF 45-50%   Last echo showed stable EF 45-50% with RWMA, G1DD, nl RV.   Presented to [facility] from EMS 01/06/24. He became unresponsive at home with family. On EMS arrival was in ventricular tachycardia. Cardioverted x2 and started on Lidocaine  gtt. In ED BP 109/57, HR 58 bpm, EKG on arrival NSR 68 bpm. CXR with pulm edema. Labs notable for K 3.0, Mg 2.0, CO2 19, BUN/Cr 18/1.78, hs-trop 26>349, WBC 9.9. Awake in ED, does not remember much of what happened. Admitting to ICU for further management. Echo ordered and pending  Lying in stretcher. Wife present. Patient had been suffering from URI/congestive cough and had seen PCP. He is now on azithromycin . Feeling better. Is not very mobile at home, wife helps with ADLs.   Home Medications Prior to Admission medications   Medication Sig Start Date End  Date Taking? Authorizing Provider  apixaban  (ELIQUIS ) 5 MG TABS tablet TAKE 1 TABLET(5 MG) BY MOUTH TWICE DAILY 02/25/22  Yes Gerard Knight, MD  azithromycin  (ZITHROMAX ) 250 MG tablet 2 today then one daily Patient taking differently: Take 250 mg by mouth daily. 01/04/24  Yes Young, Rupert Counts D, MD  Biotin -Vitamin C (HAIR SKIN NAILS GUMMIES PO) Take 2 tablets by mouth daily.   Yes [provider]  Cyanocobalamin  (VITAMIN B-12) 5000 MCG SUBL Place 1 tablet under the tongue daily.   Yes [provider]  donepezil  (ARICEPT ) 10 MG tablet Take 1 tablet (10 mg total) by mouth at bedtime. 11/03/23  Yes Johny Nap, NP  fenofibrate  160 MG tablet Take 160 mg by mouth every morning. 07/15/17  Yes [provider]  Ferrous Sulfate  (IRON PO) Take 1 tablet by mouth daily.   Yes [provider]  Insulin  Aspart (NOVOLOG  FLEXPEN [place]) Inject 8-10 Units into the skin 3 (three) times daily before meals. Dose per sliding scale   Yes [provider]  insulin  degludec (TRESIBA FLEXTOUCH) 100 UNIT/ML FlexTouch Pen Inject 32 Units into the skin daily.   Yes [provider]  [name]  10 MG TABS tablet Take 1 tablet (10 mg total) by mouth daily. Patient taking differently: Take 10 mg by mouth every morning. 01/05/24  Yes Gerard Knight, MD  levothyroxine  (SYNTHROID ) 50 MCG tablet Take 50 mcg by mouth every morning.   Yes [provider]  losartan  (COZAAR ) 25 MG tablet TAKE 1/2 TABLET(12.5 MG) BY MOUTH DAILY Patient taking differently: Take 12.5 mg  by mouth every morning. 12/18/21  Yes Gerard Knight, MD  memantine  (NAMENDA ) 10 MG tablet Take 1 tablet (10 mg total) by mouth 2 (two) times daily. 11/03/23  Yes Johny Nap, NP  metFORMIN  (GLUCOPHAGE ) 500 MG tablet Take 500 mg by mouth 2 (two) times daily with a meal. 10/10/20  Yes [provider]  Multiple Vitamin (MULTIVITAMIN WITH MINERALS) TABS tablet Take 1 tablet by mouth daily.   Yes [provider]  rosuvastatin  (CRESTOR ) 20 MG tablet TAKE 1 TABLET(20 MG) BY MOUTH DAILY Patient taking differently: Take 20 mg by mouth every evening. TAKE 1 TABLET(20 MG) BY MOUTH DAILY 08/17/23  Yes Gerard Knight, MD  sertraline  (ZOLOFT ) 25 MG tablet Take 1 tablet (25 mg total) by mouth daily. 11/03/23  Yes McCue, Camilo Cella, NP  tamsulosin  (FLOMAX ) 0.4 MG CAPS capsule Take 0.4 mg by mouth at bedtime. 05/07/22  Yes [provider]  Vedolizumab  (ENTYVIO  IV) Inject into the vein every 28 (twenty-eight) days.   Yes [provider]  BD PEN NEEDLE NANO U/F 32G X 4 MM MISC 1 each by Other route daily.  03/19/18   [provider]  Continuous Glucose Sensor (FREESTYLE LIBRE 3 PLUS SENSOR) MISC by Does not apply route. Change sensor every 15 days.    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 s/p CABG in 06/2020 with LIMA-LAD, SVG-D1 and SVG-RCA   COVID-07 Dec 2019   Dementia [facility])    Depression    Essential hypertension    Hernia, umbilical    HOH (hard of hearing)    Hyperlipidemia    Ischemic cardiomyopathy    Ischemic necrosis of small bowel (HCC)    Left ventricular mural thrombus    NSTEMI (non-ST elevated myocardial infarction) (HCC)    OSA on CPAP    Pulmonary emboli [facility])    May 2021   Skin cancer    Type 2 diabetes mellitus (HCC)     Past Surgical History: Past Surgical History:  Procedure Laterality Date   COLON SURGERY     COLONOSCOPY W/ POLYPECTOMY     CORONARY ARTERY BYPASS GRAFT N/A 07/09/2020   Procedure: CORONARY ARTERY BYPASS GRAFTING (CABG) TIMES THREE , USING LEFT INTERNAL MAMMARY ARTERY AND RIGHT LEG GREATER SAPHENOUS VEIN HARVESTED ENDOSCOPICALLY;  Surgeon: Heriberto London, MD;  Location: [facility] OR;  Service: Open Heart Surgery;  Laterality: N/A;   LEFT HEART CATH AND CORONARY  ANGIOGRAPHY N/A 07/04/2020   Procedure: LEFT HEART CATH AND CORONARY ANGIOGRAPHY;  Surgeon: Millicent Ally, MD;  Location: [facility] INVASIVE CV LAB;  Service: Cardiovascular;  Laterality: N/A;   TEE WITHOUT CARDIOVERSION N/A 07/09/2020   Procedure: TRANSESOPHAGEAL ECHOCARDIOGRAM (TEE);  Surgeon: Matt Song, Donata Fryer, MD;  Location: [facility] OR;  Service: Open Heart Surgery;  Laterality: N/A;   TOTAL KNEE ARTHROPLASTY Left 04/22/2013   Procedure: TOTAL KNEE ARTHROPLASTY- left;  Surgeon: Lorriane Rote, MD;  Location: [facility] OR;  Service: Orthopedics;  Laterality: Left;  with femoral block   TOTAL KNEE ARTHROPLASTY Right 01/06/2014   Procedure: RIGHT TOTAL KNEE ARTHROPLASTY;  Surgeon: Lorriane Rote, MD;  Location: [facility] OR;  Service: Orthopedics;  Laterality: Right;   TRANSURETHRAL RESECTION OF PROSTATE N/A 05/14/2021   Procedure: TRANSURETHRAL RESECTION OF THE PROSTATE (TURP);  Surgeon: Pace, Maryellen  D, MD;  Location: [facility] ORS;  Service: Urology;  Laterality: N/A;  90 MINS    Family History: Family History  Problem Relation Age of Onset   Alzheimer's disease Mother    CAD Sister    Alzheimer's disease Sister    CAD Brother        Premature   Alzheimer's disease Brother     Social History: Social History   Socioeconomic History   Marital status: Married    Spouse name: [name]   Number of children: Not on file   Years of education: Not on file   Highest education level: Not on file  Occupational History   Not on file  Tobacco Use   Smoking status: Never   Smokeless tobacco: Never  Vaping Use   Vaping status: Never Used  Substance and Sexual Activity   Alcohol  use: No   Drug use: No   Sexual activity: Not on file  Other Topics Concern   Not on file  Social History Narrative   Lives with wife at Daughter's home   Right Handed   Drinks very little caffeine daily   Retired    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 Strain: Not on file  Food Insecurity: No Food Insecurity  (06/17/2023)   Hunger Vital Sign    Worried About Running Out of Food in the Last Year: Never true    Ran Out of Food in the Last Year: Never true  Transportation Needs: No Transportation Needs (06/17/2023)   PRAPARE - Administrator, Civil Service (Medical): No    Lack of Transportation (Non-Medical): No  Physical Activity: Not on file  Stress: Not on file  Social Connections: Unknown (11/29/2021)   Received from [facility]   Social Network    Social Network: Not on file    Allergies:  Allergies  Allergen Reactions   Atorvastatin Other (See Comments)    Muscle pain   Sitagliptin Other (See Comments)    Myalgias    Objective:    Vital Signs:   Temp:  [96.5 F (35.8 C)-97.1 F (36.2 C)] 97.1 F (36.2 C) (06/18 1239) Pulse Rate:  [46-59] 48 (06/18 1515) Resp:  [10-21] 12 (06/18 1515) BP: (104-133)/(48-58) 125/53 (06/18 1515) SpO2:  [95 %-100 %] 98 % (06/18 1515) Weight:  [79.4 kg] 79.4 kg (06/18 0838)   Weight change: Filed Weights   01/06/24 0838  Weight: 79.4 kg   Intake/Output:  Intake/Output Summary (Last 24 hours) at 01/06/2024 1540 Last data filed at 01/06/2024 1102 Gross per 24 hour  Intake 1250 ml  Output 0 ml  Net 1250 ml    Physical Exam    General: Elderly appearing. No distress on RA Cardiac: JVP flat. S1 and S2 present. No murmurs or rub. Extremities: Warm and dry.  No peripheral edema.  Neuro: Alert and oriented x3. Affect pleasant. Moves all extremities without difficulty.  Telemetry   SB in 50s (personally reviewed)  EKG    SR 68 bpm (personally reviewed)  Labs   Basic Metabolic Panel: Recent Labs  Lab 01/06/24 0829 01/06/24 0842 01/06/24 0908  NA 142  --  147*  K 3.0*  --  3.7  CL 113*  --   --   CO2 19*  --   --   GLUCOSE 232*  --   --   BUN 18  --   --   CREATININE 1.74*  --   --   CALCIUM  8.5*  --   --  MG  --  2.0  --   PHOS  --  4.1  --    Liver Function Tests: Recent Labs  Lab 01/06/24 0829  AST  45*  ALT 11  ALKPHOS 49  BILITOT 0.5  PROT 5.5*  ALBUMIN  2.8*   CBC: Recent Labs  Lab 01/06/24 0829 01/06/24 0908  WBC 9.9  --   NEUTROABS 3.0  --   HGB 11.2* 10.5*  HCT 36.8* 31.0*  MCV 93.6  --   PLT 281  --    Medications:    Current Medications:  [START ON 01/07/2024] aspirin  EC  81 mg Oral Daily   azithromycin   250 mg Oral Daily   donepezil   10 mg Oral QHS   fenofibrate   160 mg Oral q morning   [START ON 01/07/2024] levothyroxine   50 mcg Oral q morning   memantine   10 mg Oral BID   ondansetron        rosuvastatin   20 mg Oral QPM   sertraline   25 mg Oral Daily   tamsulosin   0.4 mg Oral QHS   Infusions:  amiodarone  30 mg/hr (01/06/24 1430)   heparin  1,100 Units/hr (01/06/24 1355)   Patient Profile   Patrick Miranda is a 78 y.o. male with a past medical history of CABG in 2021, dementia, depression, HTN, HLD, paroxysmal atrial fibrillation on Eliquis , ischemic cardiomyopathy, history of PE, OSA on CPAP, type 2 DM, history of brainstem CVA. Admitted with VT.   Assessment/Plan   Ventricular Tachycardia - lost consciousness at home, witnessed. On EMS arrival, VT on monitor. Shocked x2 with return to NSR.  - EKG in ED with NSR and frequent PVCs - with ischemic history, ?scar mediated. Previously noted RWMA on prior echo - goal K>4, Mg>2 - continue lidocaine  - continue amio - not started on a beta blocker due to baseline bradycardia - consider coronary angiography for ischemic rule out once more stable - EP following, device discussion pending family/patient GOC  HFmrEF - Longstanding history of EF 45-50%. iCM. - Repeat Echo pending  CAD  - h/o CABG in 2021 (LIMA-LAD, SVG-D1, and SVG-RCA) - no chest pain - continue ASA - continue fenofibrate  + statin  PAF - in NSR on tele - continue IV amio - continue IV heparin . Prev on Eliquis   Hypokalemia - replete for goal K>4  Dementia - continue namenda  + aricept   Length of Stay: 0  CRITICAL  CARE Performed by: [place] Pheonix Clinkscale  Total critical care time: 13 minutes  -Critical care time was exclusive of separately billable procedures and treating other patients. -Critical care was necessary to treat or prevent imminent or life-threatening deterioration. -Critical care was time spent personally by me on the following activities: development of treatment plan with patient and/or surrogate as well as nursing, discussions with consultants, evaluation of patient's response to treatment, examination of patient, obtaining history from patient or surrogate, ordering and performing treatments and interventions, ordering and review of laboratory studies, ordering and review of radiographic studies, pulse oximetry and re-evaluation of patient's condition.  [place] Charlet Harr, NP  01/06/2024, 3:40 PM  Advanced Heart Failure Team Pager [phone] (M-F; 7a - 5p)  Please contact [facility] for night-coverage after hours (4p -7a ) and weekends on [email or web address]

## 2024-01-07 ENCOUNTER — Encounter (HOSPITAL_COMMUNITY)
Admission: EM | Disposition: A | Discharge status: Home Health | Payer: Self-pay | Source: Home / Self Care | Attending: Internal Medicine

## 2024-01-07 ENCOUNTER — Encounter (HOSPITAL_COMMUNITY): Payer: Self-pay | Admitting: Cardiology

## 2024-01-07 ENCOUNTER — Inpatient Hospital Stay (HOSPITAL_COMMUNITY)

## 2024-01-07 DIAGNOSIS — I5022 Chronic systolic (congestive) heart failure: Secondary | ICD-10-CM

## 2024-01-07 DIAGNOSIS — I4729 Other ventricular tachycardia: Secondary | ICD-10-CM

## 2024-01-07 DIAGNOSIS — I2510 Atherosclerotic heart disease of native coronary artery without angina pectoris: Secondary | ICD-10-CM

## 2024-01-07 HISTORY — PX: LEFT HEART CATH AND CORS/GRAFTS ANGIOGRAPHY: CATH118250

## 2024-01-07 LAB — HEPARIN LEVEL (UNFRACTIONATED): Heparin Unfractionated: 1.02 [IU]/mL — ABNORMAL HIGH (ref 0.30–0.70)

## 2024-01-07 LAB — CBC
HCT: 34 % — ABNORMAL LOW (ref 39.0–52.0)
Hemoglobin: 10.8 g/dL — ABNORMAL LOW (ref 13.0–17.0)
MCH: 28.6 pg (ref 26.0–34.0)
MCHC: 31.8 g/dL (ref 30.0–36.0)
MCV: 90.2 fL (ref 80.0–100.0)
Platelets: 281 10*3/uL (ref 150–400)
RBC: 3.77 MIL/uL — ABNORMAL LOW (ref 4.22–5.81)
RDW: 15.1 % (ref 11.5–15.5)
WBC: 8.4 10*3/uL (ref 4.0–10.5)
nRBC: 0 % (ref 0.0–0.2)

## 2024-01-07 LAB — ECHOCARDIOGRAM COMPLETE
Area-P 1/2: 3 cm2
Calc EF: 38.3 %
Height: 70 in
S' Lateral: 4.6 cm
Single Plane A2C EF: 39.4 %
Single Plane A4C EF: 34.7 %
Weight: 2800 [oz_av]

## 2024-01-07 LAB — MAGNESIUM: Magnesium: 1.9 mg/dL (ref 1.7–2.4)

## 2024-01-07 LAB — APTT: aPTT: 68 s — ABNORMAL HIGH (ref 24–36)

## 2024-01-07 LAB — BASIC METABOLIC PANEL WITH GFR
Anion gap: 7 (ref 5–15)
BUN: 23 mg/dL (ref 8–23)
CO2: 23 mmol/L (ref 22–32)
Calcium: 8.6 mg/dL — ABNORMAL LOW (ref 8.9–10.3)
Chloride: 111 mmol/L (ref 98–111)
Creatinine, Ser: 1.64 mg/dL — ABNORMAL HIGH (ref 0.61–1.24)
GFR, Estimated: 43 mL/min — ABNORMAL LOW (ref >60)
Glucose, Bld: 126 mg/dL — ABNORMAL HIGH (ref 70–99)
Potassium: 3.9 mmol/L (ref 3.5–5.1)
Sodium: 141 mmol/L (ref 135–145)

## 2024-01-07 LAB — GLUCOSE, CAPILLARY
Glucose-Capillary: 122 mg/dL — ABNORMAL HIGH (ref 70–99)
Glucose-Capillary: 138 mg/dL — ABNORMAL HIGH (ref 70–99)
Glucose-Capillary: 154 mg/dL — ABNORMAL HIGH (ref 70–99)
Glucose-Capillary: 98 mg/dL (ref 70–99)
Glucose-Capillary: 205 mg/dL — ABNORMAL HIGH (ref 70–99)

## 2024-01-07 LAB — PATHOLOGIST SMEAR REVIEW

## 2024-01-07 SURGERY — LEFT HEART CATH AND CORS/GRAFTS ANGIOGRAPHY
Anesthesia: LOCAL

## 2024-01-07 MED ORDER — CHLORHEXIDINE GLUCONATE CLOTH 2 % EX PADS
6.0000 | MEDICATED_PAD | Freq: Every day | CUTANEOUS | Status: DC
  Administered 2024-01-07: 6 via TOPICAL

## 2024-01-07 MED ORDER — VERAPAMIL HCL 2.5 MG/ML IV SOLN
INTRAVENOUS | Status: AC
  Filled 2024-01-07: qty 2

## 2024-01-07 MED ORDER — SODIUM CHLORIDE 0.9 % IV SOLN: INTRAVENOUS | Status: DC

## 2024-01-07 MED ORDER — MIDAZOLAM HCL 2 MG/2ML IJ SOLN
INTRAMUSCULAR | Status: AC
  Filled 2024-01-07: qty 2

## 2024-01-07 MED ORDER — FENTANYL CITRATE (PF) 100 MCG/2ML IJ SOLN
INTRAMUSCULAR | Status: DC | PRN
Start: 2024-01-07 — End: 2024-01-07
  Administered 2024-01-07: 25 ug via INTRAVENOUS

## 2024-01-07 MED ORDER — FENTANYL CITRATE (PF) 100 MCG/2ML IJ SOLN
INTRAMUSCULAR | Status: AC
Start: 2024-01-07 — End: 2024-01-07
  Filled 2024-01-07: qty 2

## 2024-01-07 MED ORDER — VERAPAMIL HCL 2.5 MG/ML IV SOLN
INTRAVENOUS | Status: DC | PRN
  Administered 2024-01-07: 10 mL via INTRA_ARTERIAL

## 2024-01-07 MED ORDER — SODIUM CHLORIDE 0.9% FLUSH
3.0000 mL | Freq: Two times a day (BID) | INTRAVENOUS | Status: DC
Start: 2024-01-07 — End: 2024-01-12
  Administered 2024-01-08 – 2024-01-12 (×7): 3 mL via INTRAVENOUS

## 2024-01-07 MED ORDER — HEPARIN (PORCINE) IN NACL 1000-0.9 UT/500ML-% IV SOLN
INTRAVENOUS | Status: DC | PRN
  Administered 2024-01-07 (×2): 500 mL

## 2024-01-07 MED ORDER — APIXABAN 5 MG PO TABS
5.0000 mg | ORAL_TABLET | Freq: Two times a day (BID) | ORAL | Status: DC
  Administered 2024-01-08: 5 mg via ORAL
  Filled 2024-01-07: qty 1

## 2024-01-07 MED ORDER — DOXYCYCLINE HYCLATE 100 MG PO TABS
100.0000 mg | ORAL_TABLET | Freq: Two times a day (BID) | ORAL | Status: AC
  Administered 2024-01-07 – 2024-01-08 (×4): 100 mg via ORAL
  Filled 2024-01-07 (×4): qty 1

## 2024-01-07 MED ORDER — MEXILETINE HCL 150 MG PO CAPS
150.0000 mg | ORAL_CAPSULE | Freq: Three times a day (TID) | ORAL | Status: DC
  Administered 2024-01-07 – 2024-01-08 (×4): 150 mg via ORAL
  Filled 2024-01-07 (×5): qty 1

## 2024-01-07 MED ORDER — SODIUM CHLORIDE 0.9% FLUSH: 3.0000 mL | INTRAVENOUS | Status: DC | PRN

## 2024-01-07 MED ORDER — PERFLUTREN LIPID MICROSPHERE
1.0000 mL | INTRAVENOUS | Status: AC | PRN
  Administered 2024-01-07: 4 mL via INTRAVENOUS

## 2024-01-07 MED ORDER — INSULIN ASPART 100 UNIT/ML IJ SOLN
0.0000 [IU] | Freq: Three times a day (TID) | INTRAMUSCULAR | Status: DC
  Administered 2024-01-08: 2 [IU] via SUBCUTANEOUS
  Administered 2024-01-08 (×2): 5 [IU] via SUBCUTANEOUS
  Administered 2024-01-08: 3 [IU] via SUBCUTANEOUS
  Administered 2024-01-09: 5 [IU] via SUBCUTANEOUS
  Administered 2024-01-09: 8 [IU] via SUBCUTANEOUS
  Administered 2024-01-09 – 2024-01-10 (×5): 3 [IU] via SUBCUTANEOUS
  Administered 2024-01-11: 8 [IU] via SUBCUTANEOUS
  Administered 2024-01-11: 2 [IU] via SUBCUTANEOUS
  Administered 2024-01-12: 5 [IU] via SUBCUTANEOUS

## 2024-01-07 MED ORDER — HEPARIN SODIUM (PORCINE) 1000 UNIT/ML IJ SOLN
INTRAMUSCULAR | Status: DC | PRN
  Administered 2024-01-07: 4000 [IU] via INTRAVENOUS

## 2024-01-07 MED ORDER — MIDAZOLAM HCL 2 MG/2ML IJ SOLN
INTRAMUSCULAR | Status: DC | PRN
  Administered 2024-01-07: 1 mg via INTRAVENOUS

## 2024-01-07 MED ORDER — LIDOCAINE HCL (PF) 1 % IJ SOLN
INTRAMUSCULAR | Status: DC | PRN
  Administered 2024-01-07: 2 mL

## 2024-01-07 MED ORDER — HEPARIN SODIUM (PORCINE) 1000 UNIT/ML IJ SOLN
INTRAMUSCULAR | Status: AC
  Filled 2024-01-07: qty 10

## 2024-01-07 MED ORDER — POTASSIUM CHLORIDE CRYS ER 20 MEQ PO TBCR
20.0000 meq | EXTENDED_RELEASE_TABLET | Freq: Once | ORAL | Status: AC
  Administered 2024-01-07: 20 meq via ORAL
  Filled 2024-01-07: qty 1

## 2024-01-07 MED ORDER — SODIUM CHLORIDE 0.9 % IV SOLN: 250.0000 mL | INTRAVENOUS | Status: AC | PRN

## 2024-01-07 MED ORDER — SODIUM CHLORIDE 0.9 % IV SOLN: INTRAVENOUS | Status: AC

## 2024-01-07 MED ORDER — LIDOCAINE HCL (PF) 1 % IJ SOLN
INTRAMUSCULAR | Status: AC
  Filled 2024-01-07: qty 30

## 2024-01-07 MED ORDER — IOHEXOL 350 MG/ML SOLN
INTRAVENOUS | Status: DC | PRN
  Administered 2024-01-07: 80 mL

## 2024-01-07 SURGICAL SUPPLY — 9 items
CATH 5FR JL3.5 JR4 ANG PIG MP (CATHETERS) IMPLANT
CATH INFINITI 5 FR IM (CATHETERS) IMPLANT
CATH INFINITI 5 FR LCB (CATHETERS) IMPLANT
CATH INFINITI 5FR JL4 (CATHETERS) IMPLANT
DEVICE RAD COMP TR BAND LRG (VASCULAR PRODUCTS) IMPLANT
GLIDESHEATH SLEND SS 6F .021 (SHEATH) IMPLANT
GUIDEWIRE INQWIRE 1.5J.035X260 (WIRE) IMPLANT
KIT HEART LEFT (KITS) IMPLANT
PACK CARDIAC CATHETERIZATION (CUSTOM PROCEDURE TRAY) ×1 IMPLANT

## 2024-01-07 NOTE — Progress Notes (Signed)
 Echocardiogram 2D Echocardiogram has been performed.  Emmaline Haring Alexius Hangartner RDCS 01/07/2024, 10:59 AM

## 2024-01-07 NOTE — Progress Notes (Signed)
   01/07/24 2158  BiPAP/CPAP/SIPAP  $ Non-Invasive Home Ventilator  Subsequent  BiPAP/CPAP/SIPAP Pt Type Adult  BiPAP/CPAP/SIPAP Resmed  Mask Size Medium  IPAP 13 cmH20  EPAP 7 cmH2O  FiO2 (%) 21 %  Patient Home Machine Yes  Safety Check Completed by RT for Home Unit Yes, no issues noted  Patient Home Mask Yes  Patient Home Tubing Yes  Device Plugged into RED Power Outlet Yes  BiPAP/CPAP /SiPAP Vitals  Pulse Rate 77  Resp 18  SpO2 98 %  Bilateral Breath Sounds Clear  MEWS Score/Color  MEWS Score 0  MEWS Score Color Marrie Sizer

## 2024-01-07 NOTE — Progress Notes (Addendum)
 Advanced Heart Failure Rounding Note  Cardiologist: Teddie Favre, MD  Chief Complaint: Ventricular Tachycardia Subjective:    No VT on tele overnight. SB 40-50s. On Amio 30/hr.  Cr slightly up 1.35>1.64  Objective:    Weight Range: 79.4 kg Body mass index is 25.11 kg/m.   Vital Signs:   Temp:  [96.5 F (35.8 C)-98.3 F (36.8 C)] 97.6 F (36.4 C) (06/19 0328) Pulse Rate:  [46-76] 55 (06/19 0700) Resp:  [8-25] 19 (06/19 0700) BP: (92-147)/(47-76) 113/55 (06/19 0700) SpO2:  [91 %-100 %] 93 % (06/19 0700) Weight:  [79.4 kg] 79.4 kg (06/18 0838) Last BM Date : 01/06/24 (femily endorsed BM in ED)  Weight change: Filed Weights   01/06/24 0838  Weight: 79.4 kg   Intake/Output:  Intake/Output Summary (Last 24 hours) at 01/07/2024 0734 Last data filed at 01/07/2024 0700 Gross per 24 hour  Intake 2595.37 ml  Output 1950 ml  Net 645.37 ml    Physical Exam    General: Elderly appearing. No distress on RA Cardiac: JVP flat. S1 and S2 present. No murmurs or rub. Extremities: Warm and dry.  No peripheral edema.  Neuro: Alert and oriented x3. Affect pleasant. Weakness  Telemetry   SB 50s (personally reviewed)  EKG    SB 51 bpm (personally reviewed)  Labs    CBC Recent Labs    01/06/24 0829 01/06/24 0908 01/07/24 0235  WBC 9.9  --  8.4  NEUTROABS 3.0  --   --   HGB 11.2* 10.5* 10.8*  HCT 36.8* 31.0* 34.0*  MCV 93.6  --  90.2  PLT 281  --  281   Basic Metabolic Panel Recent Labs    07/22/70 0842 01/06/24 0908 01/06/24 1801 01/07/24 0235  NA  --    < > 142 141  K  --    < > 5.5* 3.9  CL  --   --  114* 111  CO2  --   --  20* 23  GLUCOSE  --   --  134* 126*  BUN  --   --  16 23  CREATININE  --   --  1.35* 1.64*  CALCIUM   --   --  8.7* 8.6*  MG 2.0  --   --   --   PHOS 4.1  --   --   --    < > = values in this interval not displayed.   Liver Function Tests Recent Labs    01/06/24 0829  AST 45*  ALT 11  ALKPHOS 49  BILITOT 0.5  PROT  5.5*  ALBUMIN  2.8*   Hemoglobin A1C Recent Labs    01/06/24 2152  HGBA1C 7.8*   Thyroid  Function Tests Recent Labs    01/06/24 0900  TSH 4.483   Medications:    Scheduled Medications:  aspirin  EC  81 mg Oral Daily   azithromycin   250 mg Oral Daily   donepezil   10 mg Oral QHS   fenofibrate   160 mg Oral q morning   insulin  aspart  0-15 Units Subcutaneous Q4H   levothyroxine   50 mcg Oral q morning   memantine   10 mg Oral BID   potassium chloride   20 mEq Oral Once   rosuvastatin   20 mg Oral QPM   sertraline   25 mg Oral Daily   tamsulosin   0.4 mg Oral QHS   Infusions:  amiodarone  30 mg/hr (01/07/24 0700)   heparin  1,400 Units/hr (01/07/24 0700)   PRN Medications: acetaminophen , nitroGLYCERIN , ondansetron  (  ZOFRAN ) IV  Patient Profile   Patrick Miranda is a 78 y.o. male with a past medical history of CABG in 2021, dementia, depression, HTN, HLD, paroxysmal atrial fibrillation on Eliquis , ischemic cardiomyopathy, history of PE, OSA on CPAP, type 2 DM, history of brainstem CVA. Admitted with VT.   Assessment/Plan   Ventricular Tachycardia - lost consciousness at home, witnessed. On EMS arrival, VT on monitor. Shocked x2 with return to NSR.  - EKG in ED with NSR and frequent PVCs - with ischemic history, ?scar mediated. Previously noted RWMA on prior echo - goal K>4, Mg>2 - continue amio 30/hr. Stop after LHC.  - no beta blocker due to baseline bradycardia - start mexiletine 150 mg tid - LHC with possible PCI with IC today - EP following, device discussion pending family/patient GOC.    HFmrEF - Longstanding history of EF 45-50%. iCM. - Repeat Echo read pending   CAD  - h/o CABG in 2021 (LIMA-LAD, SVG-D1, and SVG-RCA) - no chest pain - continue ASA - continue fenofibrate  + statin   PAF - in NSR on tele - continue IV amio until after LHC - continue IV heparin . Prev on Eliquis , resume after cath.    Hypokalemia - replete for goal K>4   Dementia -  continue namenda  + aricept   Length of Stay: 1  Plan to transfer to out of ICU today. Will [facility] with [facility]. No need for AHF follow up.   [place] Lincon Sahlin, NP  01/07/2024, 7:34 AM  Advanced Heart Failure Team Pager [phone] (M-F; 7a - 5p). For after hours, see Amion for HF MD on call.

## 2024-01-07 NOTE — Interval H&P Note (Signed)
 History and Physical Interval Note:  01/07/2024 2:14 PM  Patrick Miranda  has presented today for surgery, with the diagnosis of vt.  The various methods of treatment have been discussed with the patient and family. After consideration of risks, benefits and other options for treatment, the patient has consented to  Procedure(s): LEFT HEART CATH AND CORS/GRAFTS ANGIOGRAPHY (N/A) as a surgical intervention.  The patient's history has been reviewed, patient examined, no change in status, stable for surgery.  I have reviewed the patient's chart and labs.  Questions were answered to the patient's satisfaction.    Cath Lab Visit (complete for each Cath Lab visit)  Clinical Evaluation Leading to the Procedure:   ACS: Yes.    Non-ACS:    Anginal Classification: CCS II  Anti-ischemic medical therapy: Minimal Therapy (1 class of medications)  Non-Invasive Test Results: No non-invasive testing performed  Prior CABG: Previous CABG       Patrick Miranda [facility] 01/07/2024 2:14 PM

## 2024-01-07 NOTE — Progress Notes (Signed)
 Palliative:  HPI: 78 y.o. male  with past medical history of dementia, brainstem CVA, CABG 2021, HTN, HLD, paroxysmal atrial fibrillation, ischemia cardiomyopathy, history of PE, OSA aon CPAP, DM2, depression admitted on 01/06/2024 with pulseless VT resuscitated with two shocks.   I met today at Patrick Miranda's bedside along with wife and daughter present. Patrick Miranda has no complaints and is a man of few words. He is ready to get through heart cath so that he can eat. He denies any pains or discomforts. Still awaiting results from echocardiogram as well. We did speak about potential for defibrillator and the risks vs benefits of this device. Wife shares that they do not believe this will be a good option for Patrick Miranda.   Patrick Miranda was taken for heart cath. I spoke further with wife and daughter. Wife shares that she is very scared about Patrick Miranda's episode with VT. Daughter was also present and we reflected on the traumatic experience of seeing him go through this. Wife admits that she knows something like this can happen and will happen one day. She does not want him to die but is also very worried about his level of suffering. She worries that he does not have good quality of life now as he is very sedentary and sleeps much of the time. She shares that he has been resistant to conversations although she does not believe he would want to be on life support if it came to that. She agrees that I can attempt further code status discussions with him tomorrow but is worried that we will be unable to elicit clear wishes.   They share that son and granddaughter were very worried and concerned about palliative care. I reassured them that my presence is for support as they are faced with difficult decisions. I am hopeful that we can have some options to optimize with medication although we are still gathering information to know more about risks and what to expect moving forward. They have good  understanding of the complicated nature of heart issues in the setting of a dementia that we know will progress and make quality of life worse with time - they want to be prudent about pursuing interventions that are more likely to add to his quality of life.   All questions/concerns addressed. Emotional support provided.   Exam: Alert, oriented. Good spirits. No distress. Breathing regular, unlabored. HR 50s. Moves all extremities.   Plan: - Ongoing goals of care conversations and support - I will discuss code status further with patient tomorrow - Leaning towards not pursuing ICD  55 min  Vila Grayer, NP Palliative Medicine Team Pager [phone] (Please see [email or web address] for schedule) Team Phone [phone]

## 2024-01-07 NOTE — Progress Notes (Signed)
 PHARMACY - ANTICOAGULATION CONSULT NOTE  Pharmacy Consult for heparin  Indication: chest pain/ACS and atrial fibrillation  Allergies  Allergen Reactions   Atorvastatin Other (See Comments)    Muscle pain   Sitagliptin Other (See Comments)    Myalgias    Patient Measurements: Height: 5' 10 (177.8 cm) Weight: 79.4 kg (175 lb) IBW/kg (Calculated) : 73 HEPARIN  DW (KG): 79.4  Vital Signs: Temp: 97.7 F (36.5 C) (06/19 0748) Temp Source: Oral (06/19 0748) BP: 126/58 (06/19 0800) Pulse Rate: 58 (06/19 0800)  Labs: Recent Labs    01/06/24 0829 01/06/24 0908 01/06/24 1029 01/06/24 1801 01/06/24 2152 01/07/24 0235 01/07/24 0808  HGB 11.2* 10.5*  --   --   --  10.8*  --   HCT 36.8* 31.0*  --   --   --  34.0*  --   PLT 281  --   --   --   --  281  --   APTT  --   --   --   --  31  --  68*  HEPARINUNFRC  --   --   --   --  0.92* 1.02*  --   CREATININE 1.74*  --   --  1.35*  --  1.64*  --   TROPONINIHS 26*  --  349*  --   --   --   --     Estimated Creatinine Clearance: 38.3 mL/min (A) (by C-G formula based on SCr of 1.64 mg/dL (H)).   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 s/p CABG in 06/2020 with LIMA-LAD, SVG-D1 and SVG-RCA   COVID-07 Dec 2019   Dementia [facility])    Depression    Essential hypertension    Hernia, umbilical    HOH (hard of hearing)    Hyperlipidemia    Ischemic cardiomyopathy    Ischemic necrosis of small bowel (HCC)    Left ventricular mural thrombus    NSTEMI (non-ST elevated myocardial infarction) (HCC)    OSA on CPAP    Pulmonary emboli [facility])    May 2021   Skin cancer    Type 2 diabetes mellitus (HCC)     Assessment: 83 YOM presenting with V-tach, hx of afib on Eliquis  PTA with last dose 6/17 @2100 , holding for cath.  Heparin  level 1.02 still impacted by recent DOAC.  aPTT 68 sec on lower end of therapeutic  range.  CBC stable.  No infusion issues/bleeding noted.  Goal of Therapy:  Heparin  level 0.3-0.7 units/ml aPTT 66-102 seconds Monitor platelets by anticoagulation protocol: Yes   Plan:  Increase Heparin  gtt to 1450 units/hr F/u post cath [facility] plan, ability to transition back to Eliquis   Cecillia Cogan, PharmD Clinical Pharmacist 01/07/2024  9:25 AM

## 2024-01-08 DIAGNOSIS — I472 Ventricular tachycardia, unspecified: Secondary | ICD-10-CM | POA: Diagnosis not present

## 2024-01-08 LAB — BASIC METABOLIC PANEL WITH GFR
Anion gap: 6 (ref 5–15)
BUN: 22 mg/dL (ref 8–23)
CO2: 22 mmol/L (ref 22–32)
Calcium: 8.2 mg/dL — ABNORMAL LOW (ref 8.9–10.3)
Chloride: 109 mmol/L (ref 98–111)
Creatinine, Ser: 1.49 mg/dL — ABNORMAL HIGH (ref 0.61–1.24)
GFR, Estimated: 48 mL/min — ABNORMAL LOW (ref >60)
Glucose, Bld: 149 mg/dL — ABNORMAL HIGH (ref 70–99)
Potassium: 4.2 mmol/L (ref 3.5–5.1)
Sodium: 137 mmol/L (ref 135–145)

## 2024-01-08 LAB — GLUCOSE, CAPILLARY
Glucose-Capillary: 138 mg/dL — ABNORMAL HIGH (ref 70–99)
Glucose-Capillary: 216 mg/dL — ABNORMAL HIGH (ref 70–99)
Glucose-Capillary: 201 mg/dL — ABNORMAL HIGH (ref 70–99)
Glucose-Capillary: 142 mg/dL — ABNORMAL HIGH (ref 70–99)
Glucose-Capillary: 183 mg/dL — ABNORMAL HIGH (ref 70–99)

## 2024-01-08 LAB — CBC
HCT: 33.9 % — ABNORMAL LOW (ref 39.0–52.0)
Hemoglobin: 10.4 g/dL — ABNORMAL LOW (ref 13.0–17.0)
MCH: 28.3 pg (ref 26.0–34.0)
MCHC: 30.7 g/dL (ref 30.0–36.0)
MCV: 92.4 fL (ref 80.0–100.0)
Platelets: 121 10*3/uL — ABNORMAL LOW (ref 150–400)
RBC: 3.67 MIL/uL — ABNORMAL LOW (ref 4.22–5.81)
RDW: 15.1 % (ref 11.5–15.5)
WBC: 6 10*3/uL (ref 4.0–10.5)
nRBC: 0 % (ref 0.0–0.2)

## 2024-01-08 LAB — MAGNESIUM: Magnesium: 1.6 mg/dL — ABNORMAL LOW (ref 1.7–2.4)

## 2024-01-08 MED ORDER — HEPARIN (PORCINE) 25000 UT/250ML-% IV SOLN
1450.0000 [IU]/h | INTRAVENOUS | Status: AC
  Administered 2024-01-08 – 2024-01-10 (×3): 1450 [IU]/h via INTRAVENOUS
  Filled 2024-01-08 (×3): qty 250

## 2024-01-08 MED ORDER — MAGNESIUM SULFATE 2 GM/50ML IV SOLN
2.0000 g | Freq: Once | INTRAVENOUS | Status: AC
  Administered 2024-01-08: 2 g via INTRAVENOUS
  Filled 2024-01-08: qty 50

## 2024-01-08 MED ORDER — AMIODARONE HCL 200 MG PO TABS
200.0000 mg | ORAL_TABLET | Freq: Two times a day (BID) | ORAL | Status: DC
  Administered 2024-01-08 – 2024-01-12 (×9): 200 mg via ORAL
  Filled 2024-01-08 (×9): qty 1

## 2024-01-08 NOTE — Consult Note (Addendum)
 ELECTROPHYSIOLOGY CONSULT NOTE    Patient ID: Patrick Miranda MRN: [id number], DOB/AGE: 01-19-1946 78 y.o.  Admit date: 01/06/2024 Date of Consult: 01/08/2024  Primary Physician: Suan Elm, MD Primary Cardiologist: Teddie Favre, MD  Electrophysiologist: New   Referring Provider: Dr. Paulita Boss  Patient Profile: Patrick Miranda is a 78 y.o. male with a history of CABG in 2021, dementia, depression, HTN, HLD, paroxysmal atrial fibrillation, ischemic cardiomyopathy, history of PE, OSA on CPAP, type 2 DM, history of brainstem CVA who is being seen today for the evaluation of VT arrest at the request of Dr. Paulita Boss.  HPI:  Patrick Miranda is a 78 y.o. male with h/o above.   Patient was brought to the [facility] ED by EMS on 6/18. Patient had become unresponsive while sitting on the couch with family. EMS found him lethargic but able to follow simple commands. He was found to be in [place], cardioverted x2, started on lido gtt. Upon arrival to the ED, vital signs showed BP 109/57, oxygen  97% on [place], HR 58 BPM. Initial EKG in the ED showed NSR with HR 68 BPM, nonspecific IVCD. CXR showed enlargement of the cardiopericardial silhouette with pulmonary vascular congestion.   Underwent Cath and Echo 6/19 which showed EF 35-40% and patent LIMA to mLAD, but occluded LAD after the graft insertion site. Patent SVG to distal RCA. Medical therapy recommended.   Palliative care consulted. Not entirely sure they are interested in ICD. EP asked to consult to help with conversation.   Pt is awake and alert, with family in room. Was USOH but did have some fatigue and symptoms leading up to the event per his wife.  Denies prior syncope, or chest pain. Has continued to speak with palliative care and family, and would like to pursue all available treatments.    Labs Potassium4.2 (06/20 0405) Magnesium   1.6* (06/20 0405) Creatinine, ser  1.49* (06/20 0405) PLT  121* (06/20  0405) HGB  10.4* (06/20 0405) WBC 6.0 (06/20 0405) Troponin I (High Sensitivity)349* (06/18 1029).    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 s/p CABG in 06/2020 with LIMA-LAD, SVG-D1 and SVG-RCA   COVID-07 Dec 2019   Dementia [facility])    Depression    Essential hypertension    Hernia, umbilical    HOH (hard of hearing)    Hyperlipidemia    Ischemic cardiomyopathy    Ischemic necrosis of small bowel (HCC)    Left ventricular mural thrombus    NSTEMI (non-ST elevated myocardial infarction) (HCC)    OSA on CPAP    Pulmonary emboli [facility])    May 2021   Skin cancer    Type 2 diabetes mellitus (HCC)      Surgical History:  Past Surgical History:  Procedure Laterality Date   COLON SURGERY     COLONOSCOPY W/ POLYPECTOMY     CORONARY ARTERY BYPASS GRAFT N/A 07/09/2020   Procedure: CORONARY ARTERY BYPASS GRAFTING (CABG) TIMES THREE , USING LEFT INTERNAL MAMMARY ARTERY AND RIGHT LEG GREATER SAPHENOUS VEIN HARVESTED ENDOSCOPICALLY;  Surgeon: Heriberto London, MD;  Location: [facility] OR;  Service: Open Heart Surgery;  Laterality: N/A;   LEFT HEART CATH AND CORONARY ANGIOGRAPHY N/A 07/04/2020   Procedure: LEFT HEART CATH AND CORONARY ANGIOGRAPHY;  Surgeon: Millicent Ally, MD;  Location: [facility] INVASIVE CV LAB;  Service: Cardiovascular;  Laterality: N/A;   LEFT HEART CATH AND CORS/GRAFTS ANGIOGRAPHY N/A 01/07/2024   Procedure: LEFT HEART CATH AND CORS/GRAFTS ANGIOGRAPHY;  Surgeon: [place], Peter M, MD;  Location: [facility] INVASIVE CV LAB;  Service: Cardiovascular;  Laterality: N/A;   TEE WITHOUT CARDIOVERSION N/A 07/09/2020   Procedure: TRANSESOPHAGEAL ECHOCARDIOGRAM (TEE);  Surgeon: Matt Song, Donata Fryer, MD;  Location: [facility] OR;  Service: Open Heart Surgery;  Laterality: N/A;   TOTAL KNEE ARTHROPLASTY Left 04/22/2013   Procedure: TOTAL KNEE ARTHROPLASTY- left;  Surgeon: Lorriane Rote, MD;  Location: [facility]  OR;  Service: Orthopedics;  Laterality: Left;  with femoral block   TOTAL KNEE ARTHROPLASTY Right 01/06/2014   Procedure: RIGHT TOTAL KNEE ARTHROPLASTY;  Surgeon: Lorriane Rote, MD;  Location: [facility] OR;  Service: Orthopedics;  Laterality: Right;   TRANSURETHRAL RESECTION OF PROSTATE N/A 05/14/2021   Procedure: TRANSURETHRAL RESECTION OF THE PROSTATE (TURP);  Surgeon: Roxane Copp, MD;  Location: [facility] ORS;  Service: Urology;  Laterality: N/A;  90 MINS     Medications Prior to Admission  Medication Sig Dispense Refill Last Dose/Taking   apixaban  (ELIQUIS ) 5 MG TABS tablet TAKE 1 TABLET(5 MG) BY MOUTH TWICE DAILY 180 tablet 1 01/05/2024 at  9:00 PM   azithromycin  (ZITHROMAX ) 250 MG tablet 2 today then one daily (Patient taking differently: Take 250 mg by mouth daily.) 6 tablet 0 01/05/2024 at 10:00 AM   Biotin -Vitamin C (HAIR SKIN NAILS GUMMIES PO) Take 2 tablets by mouth daily.   01/05/2024   Cyanocobalamin  (VITAMIN B-12) 5000 MCG SUBL Place 1 tablet under the tongue daily.   01/05/2024   donepezil  (ARICEPT ) 10 MG tablet Take 1 tablet (10 mg total) by mouth at bedtime. 90 tablet 3 01/05/2024 Bedtime   fenofibrate  160 MG tablet Take 160 mg by mouth every morning.   01/05/2024 Morning   Ferrous Sulfate  (IRON PO) Take 1 tablet by mouth daily.   01/05/2024   Insulin  Aspart (NOVOLOG  FLEXPEN [place]) Inject 8-10 Units into the skin 3 (three) times daily before meals. Dose per sliding scale   01/05/2024   insulin  degludec (TRESIBA FLEXTOUCH) 100 UNIT/ML FlexTouch Pen Inject 32 Units into the skin daily.   01/05/2024   [name]  10 MG TABS tablet Take 1 tablet (10 mg total) by mouth daily. (Patient taking differently: Take 10 mg by mouth every morning.) 90 tablet 3 01/05/2024 Morning   levothyroxine  (SYNTHROID ) 50 MCG tablet Take 50 mcg by mouth every morning.   01/05/2024 Morning   losartan  (COZAAR ) 25 MG tablet TAKE 1/2 TABLET(12.5 MG) BY MOUTH DAILY (Patient taking differently: Take 12.5 mg by mouth every morning.)  45 tablet 1 01/05/2024 Morning   memantine  (NAMENDA ) 10 MG tablet Take 1 tablet (10 mg total) by mouth 2 (two) times daily. 180 tablet 3 01/05/2024 Bedtime   metFORMIN  (GLUCOPHAGE ) 500 MG tablet Take 500 mg by mouth 2 (two) times daily with a meal.   01/05/2024 Evening   Multiple Vitamin (MULTIVITAMIN WITH MINERALS) TABS tablet Take 1 tablet by mouth daily.   01/05/2024   rosuvastatin  (CRESTOR ) 20 MG tablet TAKE 1 TABLET(20 MG) BY MOUTH DAILY (Patient taking differently: Take 20 mg by mouth every evening. TAKE 1 TABLET(20 MG) BY MOUTH DAILY) 90 tablet 3 01/05/2024 Evening   sertraline  (ZOLOFT ) 25 MG tablet Take 1 tablet (25 mg total) by mouth daily. 90 tablet 3 01/05/2024 Morning   tamsulosin  (FLOMAX ) 0.4 MG CAPS capsule Take 0.4 mg by mouth at bedtime.   01/05/2024 Bedtime   Vedolizumab  (  ENTYVIO  IV) Inject into the vein every 28 (twenty-eight) days.   Past Month   BD PEN NEEDLE NANO U/F 32G X 4 MM MISC 1 each by Other route daily.       Continuous Glucose Sensor (FREESTYLE LIBRE 3 PLUS SENSOR) MISC by Does not apply route. Change sensor every 15 days.       Inpatient Medications:   apixaban   5 mg Oral BID   aspirin  EC  81 mg Oral Daily   donepezil   10 mg Oral QHS   doxycycline   100 mg Oral Q12H   fenofibrate   160 mg Oral q morning   insulin  aspart  0-15 Units Subcutaneous TID AC & HS   levothyroxine   50 mcg Oral q morning   memantine   10 mg Oral BID   mexiletine  150 mg Oral Q8H   rosuvastatin   20 mg Oral QPM   sertraline   25 mg Oral Daily   sodium chloride  flush  3 mL Intravenous Q12H   tamsulosin   0.4 mg Oral QHS    Allergies:  Allergies  Allergen Reactions   Atorvastatin Other (See Comments)    Muscle pain   Sitagliptin Other (See Comments)    Myalgias    Family History  Problem Relation Age of Onset   Alzheimer's disease Mother    CAD Sister    Alzheimer's disease Sister    CAD Brother        Premature   Alzheimer's disease Brother      Physical Exam: Vitals:   01/07/24  2158 01/07/24 2324 01/08/24 0402 01/08/24 0829  BP:  (!) 134/59 131/70 (!) 129/50  Pulse: 77 (!) 53 (!) 53 (!) 54  Resp: 18 16 18 17   Temp:  98.2 F (36.8 C) 98.2 F (36.8 C) 98 F (36.7 C)  TempSrc:  Oral Oral Oral  SpO2: 98%  98% 99%  Weight:      Height:        GEN- NAD, A&O x 3, normal affect HEENT: Normocephalic, atraumatic Lungs- CTAB, Normal effort.  Heart- Regular rate and rhythm, No M/G/R.  GI- Soft, NT, ND.  Extremities- No clubbing, cyanosis, or edema   Radiology/Studies:   LHC 01/07/24 2 vessel occlusive CAD Patent LIMA to the mid LAD but the LAD is occluded after the graft insertion.  Occluded SVG to the diagonal. The diagonals are small in caliber and there is good antegrade flow in the native vessel Patent SVG to distal RCA Normal LVEDP 15 mm Hg  Echo 01/07/24 Showed LVEF 35-40%  EKG:  01/07/2024 shows sinus bradycardia at 51 bpm (personally reviewed)    TELEMETRY: Sinus bradycardia 40-50s overnight. No further VT (personally reviewed)  Assessment/Plan:  VT arrest Chronic systolic CHF In setting of ICM, likely scar mediated.  Initially treated with amiodarone  and now mexitil Patrick Miranda move back to amiodarone  at 200 mg BID after discussion with family. Taper.  Long discussion regarding available treatments and goals of care Pt and family would like to proceed with ICD in addition to AAD.  He has eaten and received Eliquis  this am. Patrick Miranda transition to heparin  and tentatively plan ICD Monday afternoon.    CAD s/p CABG 2021 (LIMA-LAD, SVG-D1, and SVG-RCA) Cath this admission with patent LIMA to mLAD but LAD is occluded after the graft insertion site, occluded SVG to diagonal with good antegrade flow in the native vessel, patent SVG to distal RCA.  No targets felt appropriate for PCI or further revascularization.  Recommendations for medical management.  Paroxysmal AF Maintaining NSR Heparin  as above for procedure. Stop midnight Sunday into Monday.    [facility] Palliative following; Family is leaning towards ICD  For questions or updates, please contact [facility] Please consult [email or web address] for contact info under     Signed, Patrick Galla, PA-C  01/08/2024, 10:18 AM       I have seen and examined this patient with Patrick Miranda.  Agree with above, note added to reflect my findings.  Patient with a past medical history as above.  He presented to the hospital after he became unresponsive while sitting on his couch with his family.  EMS found him lethargic but able to follow simple commands.  He was found to be in ventricular tachycardia and was cardioverted x 2.  He was initially started on lidocaine  drip and amiodarone .  Left heart catheterization showed stable coronary artery disease and an ejection fraction of 35 to 40%.  GEN: No acute distress.   Neck: No JVD Cardiac: RRR, no murmurs, rubs, or gallops.  Respiratory: normal BS bases bilaterally. GI: Soft, nontender, non-distended  MS: No edema; No deformity. Neuro:  Nonfocal  Skin: warm and dry Psych: Normal affect    VT cardiac arrest: Likely scar mediated.  Was initially on mexiletine, but Patrick Miranda transition to amiodarone .  Long discussion about further therapies for his ventricular tachycardia including continued amiodarone , ICD therapy.  At this time, the patient's family wishes to proceed with ICD therapy.  He is on Eliquis  for atrial fibrillation.  Patrick Miranda hold it the weekend and plan ICD on Monday. Coronary artery disease: Post CABG.  Has occluded LAD and occluded vein to the diagonal.  No obvious source for arrhythmia.  Management per primary team. Chronic systolic heart failure: No obvious volume overload.  Plan per primary team Paroxysmal atrial fibrillation: In normal rhythm.  Aneisha Skyles hold anticoagulation for device on Monday.  Naysa Puskas M. Josias Tomerlin MD 01/08/2024 1:57 PM

## 2024-01-08 NOTE — Plan of Care (Signed)
  Problem: Cardiac: Goal: Ability to achieve and maintain adequate cardiovascular perfusion will improve Outcome: Progressing   Problem: Clinical Measurements: Goal: Ability to maintain clinical measurements within normal limits will improve Outcome: Progressing Goal: Will remain free from infection Outcome: Progressing Goal: Diagnostic test results will improve Outcome: Progressing Goal: Respiratory complications will improve Outcome: Progressing Goal: Cardiovascular complication will be avoided Outcome: Progressing

## 2024-01-08 NOTE — Evaluation (Signed)
 Physical Therapy Evaluation Patient Details Name: Patrick Miranda MRN: [id number] DOB: August 04, 1945 Today's Date: 01/08/2024  History of Present Illness  78 y.o. male presents to [facility] hospital on 01/06/2024 with vtach. Plan for ICD placement on 6/23 per chart. PMH includes CABG, dementia, depression, HTN, HLD, PAF, ischemic cardiomyopathy, PE, OSA, DMII, CVA.  Clinical Impression  Pt presents to PT with deficits in activity tolerance, power, and adherence to precautions. Pt underwent LHC on 6/18, requiring cueing to avoid pushing through LUE during functional mobility. Pt is able to utilize a SPC in his RUE to transfer and ambulate for household distances. PT will return after planned ICD placement to reassess, however PT anticipates no post-acute PT needs.      If plan is discharge home, recommend the following: A little help with bathing/dressing/bathroom;Assistance with cooking/housework;Assist for transportation;Help with stairs or ramp for entrance   Can travel by private vehicle        Equipment Recommendations None recommended by PT  Recommendations for Other Services       Functional Status Assessment Patient has had a recent decline in their functional status and demonstrates the ability to make significant improvements in function in a reasonable and predictable amount of time.     Precautions / Restrictions Precautions Precautions: Other (comment) (recent L radial cath) Recall of Precautions/Restrictions: Intact Restrictions Weight Bearing Restrictions Per Provider Order: No      Mobility  Bed Mobility Overal bed mobility: Modified Independent             General bed mobility comments: sit to supine    Transfers Overall transfer level: Needs assistance Equipment used: Straight cane Transfers: Sit to/from Stand Sit to Stand: Supervision           General transfer comment: cues to not push with LUE after recent radial cath     Ambulation/Gait Ambulation/Gait assistance: Supervision Gait Distance (Feet): 200 Feet Assistive device: Straight cane Gait Pattern/deviations: Step-through pattern Gait velocity: functional Gait velocity interpretation: 1.31 - 2.62 ft/sec, indicative of limited community ambulator   General Gait Details: steady step-through gait  Stairs            Wheelchair Mobility     Tilt Bed    Modified Rankin (Stroke Patients Only)       Balance Overall balance assessment: Needs assistance Sitting-balance support: No upper extremity supported, Feet supported Sitting balance-Leahy Scale: Good     Standing balance support: Single extremity supported, Reliant on assistive device for balance Standing balance-Leahy Scale: Poor                               Pertinent Vitals/Pain Pain Assessment Pain Assessment: No/denies pain    Home Living Family/patient expects to be discharged to:: Private residence Living Arrangements: Spouse/significant other;Children;Other relatives Available Help at Discharge: Family;Available 24 hours/day Type of Home: Apartment Home Access: Level entry       Home Layout: One level Home Equipment: Cane - single point;Shower seat      Prior Function Prior Level of Function : Independent/Modified Independent             Mobility Comments: ambulatory with SPC when leaving the home, no DME indoors       Extremity/Trunk Assessment   Upper Extremity Assessment Upper Extremity Assessment: LUE deficits/detail LUE Deficits / Details: ROM WFL, PT providing cues to restrict use of LUE for pushing/pulling due to recent L radial cath  Lower Extremity Assessment Lower Extremity Assessment: Overall WFL for tasks assessed    Cervical / Trunk Assessment Cervical / Trunk Assessment: Normal  Communication   Communication Communication: No apparent difficulties    Cognition Arousal: Alert Behavior During Therapy: WFL for  tasks assessed/performed   PT - Cognitive impairments: No apparent impairments                         Following commands: Intact       Cueing Cueing Techniques: Verbal cues     General Comments General comments (skin integrity, edema, etc.): VSS on RA, pt in NAD    Exercises     Assessment/Plan    PT Assessment Patient needs continued PT services  PT Problem List Decreased activity tolerance;Decreased knowledge of precautions       PT Treatment Interventions Gait training;Balance training;Neuromuscular re-education;Patient/family education;DME instruction    PT Goals (Current goals can be found in the Care Plan section)  Acute Rehab PT Goals Patient Stated Goal: to return home PT Goal Formulation: With patient Time For Goal Achievement: 01/22/24 Potential to Achieve Goals: Good Additional Goals Additional Goal #1: Pt will be able to verbalize pacemaker precautions with minA from caregivers    Frequency Min 1X/week     Co-evaluation               AM-PAC PT 6 Clicks Mobility  Outcome Measure Help needed turning from your back to your side while in a flat bed without using bedrails?: None Help needed moving from lying on your back to sitting on the side of a flat bed without using bedrails?: None Help needed moving to and from a bed to a chair (including a wheelchair)?: A Little Help needed standing up from a chair using your arms (e.g., wheelchair or bedside chair)?: A Little Help needed to walk in hospital room?: A Little Help needed climbing 3-5 steps with a railing? : A Little 6 Click Score: 20    End of Session Equipment Utilized During Treatment: Gait belt Activity Tolerance: Patient tolerated treatment well Patient left: in bed;with call bell/phone within reach;with bed alarm set Nurse Communication: Mobility status PT Visit Diagnosis: Other abnormalities of gait and mobility (R26.89)    Time: 1416-1430 PT Time Calculation (min)  (ACUTE ONLY): 14 min   Charges:   PT Evaluation $PT Eval Low Complexity: 1 Low   PT General Charges $$ ACUTE PT VISIT: 1 Visit         Rexie Catena, PT, DPT Acute Rehabilitation Office [phone]   Rexie Catena 01/08/2024, 3:36 PM

## 2024-01-08 NOTE — Progress Notes (Signed)
 Palliative:  HPI: 78 y.o. male  with past medical history of dementia, brainstem CVA, CABG 2021, HTN, HLD, paroxysmal atrial fibrillation, ischemia cardiomyopathy, history of PE, OSA aon CPAP, DM2, depression admitted on 01/06/2024 with pulseless VT resuscitated with two shocks.   I met today with Mr. Detert along with wife, daughter, son and Dr. Paulita Boss who is discussing in detail the risks vs benefits of ICD placement. Ultimately they have decided to proceed with AICD placement. His dementia seems more mild at this time and is not a significant concern for AICD placement at this time. We did discuss that if his quality of life or health declines in the future that the decision can always be made to deactivate AICD to avoid shock. I explained that this can be done easily and is non-invasive. We also discussed that some patients have opted to deactivate after having many shocks to avoid the pain and suffering involved.   We did speak more about AICD for shock but the difference in outcome if this were to escalate to needing CPR/intubation. We discussed the risks vs benefits. Son expressed desire for ongoing full code - Mr. Weideman and other family do not disagree or dispute. Goals are clear. Mr. Renken smiles contently - he does not disagree. He denies any concerns or questions. He agrees with family and their wishes.   All questions/concerns addressed. Emotional support provided.   Exam: Alert, oriented. Continued good spirits. No distress. Breathing regular, unlabored. HR 50s. Moves all extremities.    Plan: - Full code verified - Proceed with ICD   35 min  Vila Grayer, NP Palliative Medicine Team Pager [phone] (Please see [email or web address] for schedule) Team Phone [phone]

## 2024-01-08 NOTE — Care Management (Signed)
 Transition of Care [facility]) - Inpatient Brief Assessment   Patient Details  Name: Patrick Miranda MRN: [id number] Date of Birth: 08-Apr-1946  Transition of [facility] CM/SW Contact:    Ronni Colace, RN Phone Number: 01/08/2024, 10:01 AM   Clinical Narrative:  78 year old from home on CPAP presented with a episode of VT. Palliative is working with family will see them today for follow up and to discuss status  TOC will follow for needs, recommendations, and transitions of care.  Transition of Care Asessment: Insurance and Status: Insurance coverage has been reviewed Patient has primary care physician: Yes Home environment has been reviewed: home with spouse Prior level of function:: hx dementia, sleeping alot Prior/Current Home Services: No current home services Social Drivers of Health Review: SDOH reviewed no interventions necessary Readmission risk has been reviewed: Yes Transition of care needs: no transition of care needs at this time

## 2024-01-08 NOTE — Progress Notes (Addendum)
 Progress Note  Patient Name: Patrick Miranda Date of Encounter: 01/08/2024 [facility] Cardiologist: Teddie Favre, MD   Interval Summary    Sitting up in the chair, family at the bedside. No complaints.   Vital Signs Vitals:   01/07/24 2158 01/07/24 2324 01/08/24 0402 01/08/24 0829  BP:  (!) 134/59 131/70 (!) 129/50  Pulse: 77 (!) 53 (!) 53 (!) 54  Resp: 18 16 18 17   Temp:  98.2 F (36.8 C) 98.2 F (36.8 C) 98 F (36.7 C)  TempSrc:  Oral Oral Oral  SpO2: 98%  98% 99%  Weight:      Height:        Intake/Output Summary (Last 24 hours) at 01/08/2024 0954 Last data filed at 01/08/2024 0829 Gross per 24 hour  Intake 1054.77 ml  Output 150 ml  Net 904.77 ml      01/07/2024    5:21 PM 01/06/2024    8:38 AM 01/04/2024    9:00 AM  Last 3 Weights  Weight (lbs) 175 lb 0.7 oz 175 lb 175 lb 6.4 oz  Weight (kg) 79.4 kg 79.379 kg 79.561 kg      Telemetry/ECG   Sinus Bradycardia - Personally Reviewed  Physical Exam   GEN: No acute distress.   Neck: No JVD Cardiac: RRR, no murmurs, rubs, or gallops.  Respiratory: Clear to auscultation bilaterally. GI: Soft, nontender, non-distended  MS: No edema  Assessment & Plan   Patrick Miranda is a 78 y.o. male with a past medical history of CABG in 2021, dementia, depression, HTN, HLD, paroxysmal atrial fibrillation on Eliquis , ischemic cardiomyopathy, history of PE, OSA on CPAP, type 2 DM, history of brainstem CVA. Admitted with VT.   Ventricular tachycardia HFrEF -- Lost consciousness at home, witnessed by family.  On EMS arrival was noted to be in VT and shocked twice with return of sinus rhythm. -- Treated with IV amiodarone , transitioned to mexiletine 150 mg 3 times daily. Baseline bradycardia, therefore no room for BB therapy -- Echocardiogram 6/19 with LVEF of 35 to 40%, global hypokinesis with aneurysmal deformity of the basal and mid inferior wall with no evidence of LV thrombus.  RV moderately  reduced/enlarged with normal PA pressure, moderate MR -- palliative care following for GOC -- spoke with family/pt this morning, they are on the fence regarding possible ICD. He does have baseline dementia and decreased functional status. They would like to speak EP today, will reach out   CAD s/p CABG '21 (LIMA-LAD, SVG-D1, and SVG-RCA)  -- Cardiac catheterization 619 with patent LIMA to mLAD but LAD is occluded after the graft insertion site, occluded SVG to diagonal with good antegrade flow in the native vessel, patent SVG to distal RCA.  No targets felt appropriate for PCI or further revascularization.  Recommendations for medical management. -- on asa, statin   Paroxysmal atrial fibrillation -- Remains in sinus on telemetry -- Eliquis  5mg  BID resumed this morning  Dementia -- on namenda /aricept    Medical Readiness Date: 01/09/2024    For questions or updates, please contact [name] Please consult [email or web address] for contact info under       Signed, Johnie Nailer, NP    I have personally seen and examined the patient.  My HPI, Exam, and assessment and plan are below, independent of the NPP above.  Patients family notes that he is strong and has many years ahead of him. Wife notes that she has anxiety about leaving him by himself at home  and that an secondary prevention ICD would help.  Exam notable for  Gen: no distress   Neck: No JVD Cardiac: No Rubs or Gallops, systolic Murmur, regular bradycardia, +2 radial pulses Respiratory: Clear to auscultation bilaterally, normal effort, normal  respiratory rate GI: Soft, nontender, non-distended  Integument: Skin feels warm Neuro:  At time of evaluation, alert and oriented to person/place/time  Psych: Normal affect Minimally conversant  In assessment and plan:  Continue current coronary plan, no change in AAD therapy, euvolemic; will make now current changes to plan as above.  Family meeting:  We discussed risks  and benefits of ICD in the setting of dementia (mild to moderate with sedentary activity but able to do ADLs, recent evaluation at [facility] neuropsychology, LVEF (listed as above 35%) and Code status.  We discussed the risks and benefits of primary prevention ICD.  Discussed how ICD discussion would affect CODE status.  Discussed how ICD could affect mobility in the healing phase.  Discussed unclear long term prognosis.  Discussed how AF could affect, potentially, inappropriate shocks.  PC NP Ms. Daneil Dunker present for SDM. - no change in Code status at this time - EP to see for evaluation - no further questions at this time - discussed with patient, wife, son, & daughter  Gloriann Larger, MD FASE [facility] Cardiologist [facility]  [address], [address] [place], [place] [address] [phone]  11:05 AM

## 2024-01-08 NOTE — Evaluation (Signed)
 Occupational Therapy Evaluation Patient Details Name: Patrick Miranda MRN: [id number] DOB: Jan 31, 1946 Today's Date: 01/08/2024   History of Present Illness   78 y.o. male presents to [facility] hospital on 01/06/2024 with vtach. Plan for PPM placement on 6/23 per chart. PMH includes CABG, dementia, depression, HTN, HLD, PAF, ischemic cardiomyopathy, PE, OSA, DMII, CVA.     Clinical Impressions Pt admitted based on above, and was seen based on problem list below. PTA pt was independent with ADLs and IADLs. Today pt is requiring set up  to s for safety for ADLs. Functional transfers are  s for safety. Pt with good recall of limited use of LUE. Educated pt on post-pacemaker restrictions. Anticipate pt will progress well , no follow up OT or DME needs. OT will follow up post-op to continue treatment and family education.        If plan is discharge home, recommend the following:   A little help with walking and/or transfers;A little help with bathing/dressing/bathroom     Functional Status Assessment   Patient has had a recent decline in their functional status and demonstrates the ability to make significant improvements in function in a reasonable and predictable amount of time.     Equipment Recommendations   None recommended by OT      Precautions/Restrictions   Precautions Precautions: Other (comment) (recent L radial cath) Recall of Precautions/Restrictions: Intact Restrictions Weight Bearing Restrictions Per Provider Order: No     Mobility Bed Mobility Overal bed mobility: Modified Independent     General bed mobility comments: No use of bed features    Transfers Overall transfer level: Needs assistance Equipment used: None Transfers: Sit to/from Stand, Bed to chair/wheelchair/BSC Sit to Stand: Supervision     Step pivot transfers: Supervision     General transfer comment: Increased time, no assist, 128ft functional mobility in hallway, no AD       Balance Overall balance assessment: Needs assistance Sitting-balance support: No upper extremity supported, Feet supported Sitting balance-Leahy Scale: Good     Standing balance support: Reliant on assistive device for balance, No upper extremity supported, During functional activity Standing balance-Leahy Scale: Poor Standing balance comment: Pt benefits from use of UE support       ADL either performed or assessed with clinical judgement   ADL Overall ADL's : Needs assistance/impaired Eating/Feeding: Set up;Sitting   Grooming: Wash/dry hands;Supervision/safety;Standing           Upper Body Dressing : Set up;Sitting   Lower Body Dressing: Supervision/safety;Sit to/from stand Lower Body Dressing Details (indicate cue type and reason): Able to figure 4 legs Toilet Transfer: Supervision/safety;Regular Toilet   Toileting- Clothing Manipulation and Hygiene: Supervision/safety;Sit to/from stand Toileting - Clothing Manipulation Details (indicate cue type and reason): S for safety with LUE     Functional mobility during ADLs: Supervision/safety General ADL Comments: Pt with good recall of LUE restrictions, educated on post pacemaker precautions     Vision Baseline Vision/History: 0 No visual deficits              Pertinent Vitals/Pain Pain Assessment Pain Assessment: No/denies pain     Extremity/Trunk Assessment Upper Extremity Assessment Upper Extremity Assessment: Overall WFL for tasks assessed LUE Deficits / Details: ROM WFL, PT providing cues to restrict use of LUE for pushing/pulling due to recent L radial cath   Lower Extremity Assessment Lower Extremity Assessment: Defer to PT evaluation   Cervical / Trunk Assessment Cervical / Trunk Assessment: Normal   Communication Communication Communication: No  apparent difficulties   Cognition Arousal: Alert Behavior During Therapy: WFL for tasks assessed/performed Cognition: History of cognitive impairments    OT - Cognition Comments: Hx of dementia pt A & O x4, decreased STM, overall WFL     Following commands: Intact       Cueing  General Comments   Cueing Techniques: Verbal cues  VSS on RA           Home Living Family/patient expects to be discharged to:: Private residence Living Arrangements: Spouse/significant other;Children;Other relatives Available Help at Discharge: Family;Available 24 hours/day Type of Home: Apartment Home Access: Level entry     Home Layout: One level     Bathroom Shower/Tub: Chief Strategy Officer: Standard     Home Equipment: Cane - single point;Shower seat;Other (comment) (Adjustable bed)          Prior Functioning/Environment Prior Level of Function : Independent/Modified Independent     Mobility Comments: ambulatory with SPC when leaving the home, no DME indoors      OT Problem List: Decreased range of motion;Decreased strength;Decreased activity tolerance;Impaired balance (sitting and/or standing);Cardiopulmonary status limiting activity;Decreased knowledge of use of DME or AE;Decreased knowledge of precautions   OT Treatment/Interventions: Self-care/ADL training;Therapeutic exercise;Energy conservation;DME and/or AE instruction;Therapeutic activities;Patient/family education;Balance training      OT Goals(Current goals can be found in the care plan section)   Acute Rehab OT Goals Patient Stated Goal: To get better OT Goal Formulation: With patient Time For Goal Achievement: 01/22/24 Potential to Achieve Goals: Good   OT Frequency:  Min 2X/week       AM-PAC OT 6 Clicks Daily Activity     Outcome Measure Help from another person eating meals?: None Help from another person taking care of personal grooming?: A Little Help from another person toileting, which includes using toliet, bedpan, or urinal?: A Little Help from another person bathing (including washing, rinsing, drying)?: A Little Help from another  person to put on and taking off regular upper body clothing?: A Little Help from another person to put on and taking off regular lower body clothing?: A Little 6 Click Score: 19   End of Session Equipment Utilized During Treatment: Gait belt Nurse Communication: Mobility status  Activity Tolerance: Patient tolerated treatment well Patient left: in bed;with call bell/phone within reach;with bed alarm set  OT Visit Diagnosis: Unsteadiness on feet (R26.81);Other abnormalities of gait and mobility (R26.89)                Time: 8119-1478 OT Time Calculation (min): 14 min Charges:  OT General Charges $OT Visit: 1 Visit OT Evaluation $OT Eval Low Complexity: 1 Low OT Treatments $Self Care/Home Management : 8-22 mins  Delmer Ferraris, OT  Acute Rehabilitation Services Office [phone] Secure chat preferred    Mickael Alamo 01/08/2024, 4:05 PM

## 2024-01-08 NOTE — TOC Initial Note (Signed)
 Transition of Care [facility]) - Initial/Assessment Note    Patient Details  Name: Patrick Miranda MRN: [id number] Date of Birth: August 31, 1945  Transition of Care [facility] CM/SW Contact:    Benjiman Bras, RN Phone Number: [phone] 01/08/2024, 11:31 AM  Clinical Narrative:                 TOC CM spoke to pt, wife and dtr at bedside. Pt was independent pta. Uses cane at home for ambulation.  Had [facility] at home for [place]. Will arrange follow up appt with PCP at dc.  Will continue to follow for dc needs.   Expected Discharge Plan: Home w Home Health Services Barriers to Discharge: Continued Medical Work up   Patient Goals and CMS Choice Patient states their goals for this hospitalization and ongoing recovery are:: wants to remain independent CMS Medicare.gov Compare Post Acute Care list provided to:: Patient Represenative (must comment) Choice offered to / list presented to : Spouse      Expected Discharge Plan and Services   Discharge Planning Services: CM Consult Post Acute Care Choice: Home Health Living arrangements for the past 2 months: Single Family Home                                      Prior Living Arrangements/Services Living arrangements for the past 2 months: Single Family Home Lives with:: Spouse Patient language and need for interpreter reviewed:: Yes Do you feel safe going back to the place where you live?: Yes      Need for Family Participation in Patient Care: No (Comment) Care giver support system in place?: Yes (comment) Current home services: DME (cane) Criminal Activity/Legal Involvement Pertinent to Current Situation/Hospitalization: No - Comment as needed  Activities of Daily Living   ADL Screening (condition at time of admission) Independently performs ADLs?: Yes (appropriate for developmental age) Is the patient deaf or have difficulty hearing?: Yes Does the patient have difficulty seeing, even when wearing glasses/contacts?: No Does  the patient have difficulty concentrating, remembering, or making decisions?: No  Permission Sought/Granted Permission sought to share information with : Case Manager, Family Supports, PCP Permission granted to share information with : Yes, Verbal Permission Granted  Share Information with NAME: [employer]  Permission granted to share info w AGENCY: Home Health, PCP  Permission granted to share info w Relationship: wife  Permission granted to share info w Contact Information: [phone]  Emotional Assessment Appearance:: Appears stated age Attitude/Demeanor/Rapport: Engaged Affect (typically observed): Accepting Orientation: : Oriented to Self, Oriented to Place, Oriented to  Time, Oriented to Situation   Psych Involvement: No (comment)  Admission diagnosis:  Hypokalemia [E87.6] Ventricular tachycardia (HCC) [I47.20] V-tach (HCC) [I47.20] Unresponsive [R41.89] Patient Active Problem List   Diagnosis Date Noted   V-tach (HCC) 01/06/2024   Septic bursitis with surrounding cellulitis 06/17/2023   Acute kidney injury superimposed on CKD (HCC) 06/17/2023   Crohn disease (HCC) 06/17/2023   Lower urinary tract infectious disease    Chronic systolic HF (heart failure) (HCC)    Sepsis (HCC) 05/18/2021   BPH with obstruction/lower urinary tract symptoms 05/14/2021   Dementia (HCC) 09/13/2020   Umbilical hernia, incarcerated 09/13/2020   Metabolic encephalopathy 09/09/2020   Abdominal pain 08/03/2020   Chronic kidney disease, stage 3a (HCC) 08/03/2020   Mixed diabetic hyperlipidemia associated with type 2 diabetes mellitus (HCC) 08/03/2020   Type 2 diabetes mellitus with  hyperglycemia, with long-term current use of insulin  (HCC) 08/03/2020   Foodborne gastroenteritis 08/03/2020   SIRS (systemic inflammatory response syndrome) (HCC) 08/03/2020   Postop check 07/25/2020   Hx of CABG 07/09/2020   Coronary artery disease involving native heart 07/04/2020   Abnormal nuclear stress  test    Atrial fibrillation, chronic (HCC) 12/14/2019   Hypersomnia 08/30/2019   History of adenomatous polyp of colon 05/06/2018   Vasomotor rhinitis 07/18/2017   Arthritis of knee, right 01/06/2014   CVA (cerebral vascular accident) (HCC) 09/07/2011    Class: Acute   Dysarthria 09/05/2011    Class: Acute   Primary hypertension 09/05/2011    Class: Chronic   OSA on CPAP 09/05/2011    Class: Chronic   Anemia 09/05/2011    Class: Chronic   PCP:  Suan Elm, MD Pharmacy:   [facility] [id number] - [place], [place] - [address] AT [facility] [address] & [address] [place] [address] Phone: [phone] Fax: [phone]     Social Drivers of Health (SDOH) Social History: SDOH Screenings   Food Insecurity: No Food Insecurity (01/06/2024)  Housing: Low Risk  (01/06/2024)  Transportation Needs: No Transportation Needs (01/06/2024)  Utilities: Not At Risk (01/06/2024)  Alcohol  Screen: Low Risk  (05/23/2021)  Depression (PHQ2-9): Low Risk  (01/19/2020)  Social Connections: Socially Integrated (01/06/2024)  Tobacco Use: Low Risk  (01/06/2024)   SDOH Interventions:     Readmission Risk Interventions    05/20/2021    3:14 PM 05/20/2021   11:15 AM  Readmission Risk Prevention Plan  Transportation Screening Complete Complete  Medication Review Oceanographer) Complete Complete  PCP or Specialist appointment within 3-5 days of discharge Complete Not Complete  HRI or Home Care Consult  Complete  SW Recovery Care/Counseling Consult  Complete  Palliative Care Screening  Not Complete  Comments  Ordered  Skilled Nursing Facility Complete Not Complete  SNF Comments  PT pending

## 2024-01-08 NOTE — Progress Notes (Signed)
 PHARMACY - ANTICOAGULATION CONSULT NOTE  Pharmacy Consult for heparin  Indication: chest pain/ACS and atrial fibrillation  Allergies  Allergen Reactions   Atorvastatin Other (See Comments)    Muscle pain   Sitagliptin Other (See Comments)    Myalgias    Patient Measurements: Height: 5' 10 (177.8 cm) Weight: 79.4 kg (175 lb 0.7 oz) IBW/kg (Calculated) : 73 HEPARIN  DW (KG): 79.4  Vital Signs: Temp: 98 F (36.7 C) (06/20 0829) Temp Source: Oral (06/20 0829) BP: 129/50 (06/20 0829) Pulse Rate: 54 (06/20 0829)  Labs: Recent Labs    01/06/24 0829 01/06/24 0908 01/06/24 1029 01/06/24 1801 01/06/24 2152 01/07/24 0235 01/07/24 0808 01/08/24 0405  HGB 11.2* 10.5*  --   --   --  10.8*  --  10.4*  HCT 36.8* 31.0*  --   --   --  34.0*  --  33.9*  PLT 281  --   --   --   --  281  --  121*  APTT  --   --   --   --  31  --  68*  --   HEPARINUNFRC  --   --   --   --  0.92* 1.02*  --   --   CREATININE 1.74*  --   --  1.35*  --  1.64*  --  1.49*  TROPONINIHS 26*  --  349*  --   --   --   --   --     Estimated Creatinine Clearance: 42.2 mL/min (A) (by C-G formula based on SCr of 1.49 mg/dL (H)).   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 s/p CABG in 06/2020 with LIMA-LAD, SVG-D1 and SVG-RCA   COVID-07 Dec 2019   Dementia [facility])    Depression    Essential hypertension    Hernia, umbilical    HOH (hard of hearing)    Hyperlipidemia    Ischemic cardiomyopathy    Ischemic necrosis of small bowel (HCC)    Left ventricular mural thrombus    NSTEMI (non-ST elevated myocardial infarction) (HCC)    OSA on CPAP    Pulmonary emboli [facility])    May 2021   Skin cancer    Type 2 diabetes mellitus (HCC)     Assessment: 49 YOM presenting with V-tach, hx of afib on Eliquis  PTA and he is s.p cath and apixaban  was restarted. Plans are for PPM on Monday so now  to be on IV heparin    -last apixaban  dose ~ 9am on 6/20 -On heparin  for cath and last infusion rate was 1450 units/hr and aPTT was at goal   Goal of Therapy:  Heparin  level 0.3-0.7 units/ml aPTT 66-102 seconds Monitor platelets by anticoagulation protocol: Yes   Plan:  -Start heparin  1450 units/hr at 10pm -aPTT and heparin  level in 8 hrs -Heparin  to stop at midnight on Sunday for PPM on Monday  Baxter Limber, PharmD Clinical Pharmacist **Pharmacist phone directory can now be found on [email or web address] ([email or web address]).  Listed under [facility].

## 2024-01-09 LAB — GLUCOSE, CAPILLARY
Glucose-Capillary: 171 mg/dL — ABNORMAL HIGH (ref 70–99)
Glucose-Capillary: 172 mg/dL — ABNORMAL HIGH (ref 70–99)
Glucose-Capillary: 184 mg/dL — ABNORMAL HIGH (ref 70–99)
Glucose-Capillary: 207 mg/dL — ABNORMAL HIGH (ref 70–99)
Glucose-Capillary: 251 mg/dL — ABNORMAL HIGH (ref 70–99)

## 2024-01-09 LAB — CBC
HCT: 34.2 % — ABNORMAL LOW (ref 39.0–52.0)
Hemoglobin: 10.9 g/dL — ABNORMAL LOW (ref 13.0–17.0)
MCH: 28 pg (ref 26.0–34.0)
MCHC: 31.9 g/dL (ref 30.0–36.0)
MCV: 87.9 fL (ref 80.0–100.0)
Platelets: 259 10*3/uL (ref 150–400)
RBC: 3.89 MIL/uL — ABNORMAL LOW (ref 4.22–5.81)
RDW: 14.8 % (ref 11.5–15.5)
WBC: 7 10*3/uL (ref 4.0–10.5)
nRBC: 0 % (ref 0.0–0.2)

## 2024-01-09 LAB — BASIC METABOLIC PANEL WITH GFR
Anion gap: 8 (ref 5–15)
BUN: 23 mg/dL (ref 8–23)
CO2: 22 mmol/L (ref 22–32)
Calcium: 8.5 mg/dL — ABNORMAL LOW (ref 8.9–10.3)
Chloride: 107 mmol/L (ref 98–111)
Creatinine, Ser: 1.45 mg/dL — ABNORMAL HIGH (ref 0.61–1.24)
GFR, Estimated: 49 mL/min — ABNORMAL LOW (ref >60)
Glucose, Bld: 170 mg/dL — ABNORMAL HIGH (ref 70–99)
Potassium: 4.2 mmol/L (ref 3.5–5.1)
Sodium: 137 mmol/L (ref 135–145)

## 2024-01-09 LAB — HEPARIN LEVEL (UNFRACTIONATED): Heparin Unfractionated: 1.03 [IU]/mL — ABNORMAL HIGH (ref 0.30–0.70)

## 2024-01-09 LAB — SURGICAL PCR SCREEN
MRSA, PCR: POSITIVE — AB
Staphylococcus aureus: POSITIVE — AB

## 2024-01-09 LAB — LIPOPROTEIN A (LPA): Lipoprotein (a): <8.4 nmol/L (ref <75.0)

## 2024-01-09 LAB — MAGNESIUM: Magnesium: 1.9 mg/dL (ref 1.7–2.4)

## 2024-01-09 LAB — APTT: aPTT: 74 s — ABNORMAL HIGH (ref 24–36)

## 2024-01-09 MED ORDER — MUPIROCIN 2 % EX OINT
1.0000 | TOPICAL_OINTMENT | Freq: Two times a day (BID) | CUTANEOUS | Status: DC
  Administered 2024-01-10 – 2024-01-12 (×5): 1 via NASAL
  Filled 2024-01-09: qty 22

## 2024-01-09 MED ORDER — MAGNESIUM SULFATE 2 GM/50ML IV SOLN
2.0000 g | Freq: Once | INTRAVENOUS | Status: AC
  Administered 2024-01-09: 2 g via INTRAVENOUS
  Filled 2024-01-09: qty 50

## 2024-01-09 MED ORDER — SODIUM CHLORIDE 0.9 % IV SOLN
80.0000 mg | INTRAVENOUS | Status: AC
  Administered 2024-01-11: 80 mg
  Filled 2024-01-09: qty 2

## 2024-01-09 MED ORDER — CHLORHEXIDINE GLUCONATE CLOTH 2 % EX PADS
6.0000 | MEDICATED_PAD | Freq: Every day | CUTANEOUS | Status: DC
  Administered 2024-01-10 – 2024-01-11 (×2): 6 via TOPICAL

## 2024-01-09 MED ORDER — CHLORHEXIDINE GLUCONATE 4 % EX SOLN
60.0000 mL | Freq: Once | CUTANEOUS | Status: AC
  Administered 2024-01-10: 4 via TOPICAL
  Filled 2024-01-09: qty 60

## 2024-01-09 MED ORDER — LORATADINE 10 MG PO TABS
10.0000 mg | ORAL_TABLET | Freq: Every day | ORAL | Status: DC
  Administered 2024-01-09 – 2024-01-12 (×4): 10 mg via ORAL
  Filled 2024-01-09 (×4): qty 1

## 2024-01-09 MED ORDER — CHLORHEXIDINE GLUCONATE 4 % EX SOLN
60.0000 mL | Freq: Once | CUTANEOUS | Status: AC
  Administered 2024-01-11: 4 via TOPICAL
  Filled 2024-01-09: qty 60

## 2024-01-09 NOTE — Progress Notes (Signed)
Patient placed on home CPAP for the night.

## 2024-01-09 NOTE — Progress Notes (Signed)
 Progress Note  Patient Name: Patrick Miranda Date of Encounter: 01/09/2024  Primary Cardiologist: Jayson Sierras, MD   Subjective   Denies chest pain or sob. Feels better.   Inpatient Medications    Scheduled Meds:  amiodarone   200 mg Oral BID   aspirin  EC  81 mg Oral Daily   donepezil   10 mg Oral QHS   fenofibrate   160 mg Oral q morning   insulin  aspart  0-15 Units Subcutaneous TID AC & HS   levothyroxine   50 mcg Oral q morning   memantine   10 mg Oral BID   rosuvastatin   20 mg Oral QPM   sertraline   25 mg Oral Daily   sodium chloride  flush  3 mL Intravenous Q12H   tamsulosin   0.4 mg Oral QHS   Continuous Infusions:  heparin  1,450 Units/hr (01/09/24 0316)   PRN Meds: acetaminophen , nitroGLYCERIN , ondansetron  (ZOFRAN ) IV, sodium chloride  flush   Vital Signs    Vitals:   01/08/24 2127 01/09/24 0005 01/09/24 0430 01/09/24 0741  BP:  (!) 140/76 (!) 135/59 (!) 146/72  Pulse: (!) 49 (!) 51 (!) 48   Resp:  19 16 20   Temp:  99 F (37.2 C) 98.2 F (36.8 C) 97.9 F (36.6 C)  TempSrc:  Oral Oral Axillary  SpO2: 100% 97% 100%   Weight:      Height:        Intake/Output Summary (Last 24 hours) at 01/09/2024 1030 Last data filed at 01/09/2024 0913 Gross per 24 hour  Intake 679.07 ml  Output 1150 ml  Net -470.93 ml   Filed Weights   01/06/24 0838 01/07/24 1721  Weight: 79.4 kg 79.4 kg    Telemetry    Nsr with PVC's - Personally Reviewed  ECG    none - Personally Reviewed  Physical Exam   GEN: No acute distress.   Neck: No JVD Cardiac: RRR, no murmurs, rubs, or gallops.  Respiratory: Clear to auscultation bilaterally. GI: Soft, nontender, non-distended  MS: No edema; No deformity. Neuro:  Nonfocal  Psych: Normal affect   Labs    Chemistry Recent Labs  Lab 01/06/24 0829 01/06/24 0908 01/07/24 0235 01/08/24 0405 01/09/24 0550  NA 142   < > 141 137 137  K 3.0*   < > 3.9 4.2 4.2  CL 113*   < > 111 109 107  CO2 19*   < > 23 22 22   GLUCOSE  232*   < > 126* 149* 170*  BUN 18   < > 23 22 23   CREATININE 1.74*   < > 1.64* 1.49* 1.45*  CALCIUM  8.5*   < > 8.6* 8.2* 8.5*  PROT 5.5*  --   --   --   --   ALBUMIN  2.8*  --   --   --   --   AST 45*  --   --   --   --   ALT 11  --   --   --   --   ALKPHOS 49  --   --   --   --   BILITOT 0.5  --   --   --   --   GFRNONAA 40*   < > 43* 48* 49*  ANIONGAP 10   < > 7 6 8    < > = values in this interval not displayed.     Hematology Recent Labs  Lab 01/07/24 0235 01/08/24 0405 01/09/24 0550  WBC 8.4 6.0 7.0  RBC 3.77*  3.67* 3.89*  HGB 10.8* 10.4* 10.9*  HCT 34.0* 33.9* 34.2*  MCV 90.2 92.4 87.9  MCH 28.6 28.3 28.0  MCHC 31.8 30.7 31.9  RDW 15.1 15.1 14.8  PLT 281 121* 259    Cardiac EnzymesNo results for input(s): TROPONINI in the last 168 hours. No results for input(s): TROPIPOC in the last 168 hours.   BNPNo results for input(s): BNP, PROBNP in the last 168 hours.   DDimer No results for input(s): DDIMER in the last 168 hours.   Radiology    CARDIAC CATHETERIZATION Result Date: 01/07/2024   Prox RCA lesion is 99% stenosed.   Prox LAD to Mid LAD lesion is 80% stenosed.   Mid LAD lesion is 100% stenosed.   Dist LAD lesion is 100% stenosed.   Origin to Prox Graft lesion is 100% stenosed.   LIMA graft was visualized by angiography and is normal in caliber.   SVG graft was visualized by angiography and is normal in caliber.   SVG graft was visualized by angiography.   The graft exhibits no disease.   The graft exhibits no disease.   LV end diastolic pressure is normal. 2 vessel occlusive CAD Patent LIMA to the mid LAD but the LAD is occluded after the graft insertion. Occluded SVG to the diagonal. The diagonals are small in caliber and there is good antegrade flow in the native vessel Patent SVG to distal RCA Normal LVEDP 15 mm Hg Plan: overall circulation with grafts looks Ok but LAD is occluded distal to the LIMA insertion. No targets for PCI or further revascularization.  Manage medically. IV heparin  discontinued. Can resume Eliquis  tomorrow unless further procedures planned.   ECHOCARDIOGRAM COMPLETE Result Date: 01/07/2024    ECHOCARDIOGRAM REPORT   Patient Name:   Patrick Miranda Date of Exam: 01/07/2024 Medical Rec #:  [id number]          Height:       70.0 in Accession #:    [id number]         Weight:       175.0 lb Date of Birth:  [birth year]          BSA:          1.972 m Patient Age:    78 years           BP:           131/80 mmHg Patient Gender: M                  HR:           53 bpm. Exam Location:  Inpatient Procedure: 2D Echo, Cardiac Doppler, Color Doppler and Intracardiac            Opacification Agent (Both Spectral and Color Flow Doppler were            utilized during procedure). Indications:    I47.2 Ventricular tachycardia  History:        Patient has prior history of Echocardiogram examinations, most                 recent 06/23/2022. Prior CABG, Arrythmias:Atrial Fibrillation;                 Risk Factors:Hypertension, Diabetes, Dyslipidemia and Sleep                 Apnea.  Sonographer:    Damien Senior RDCS Referring Phys: ROLLO JONELLE LOUDER IMPRESSIONS  1. There is global LV hypokinesis with  aneursymal deformity of the basal to mid inferior wall. No evidence of LV thrombus with definity . Left ventricular ejection fraction, by estimation, is 35 to 40%. Left ventricular ejection fraction by 2D MOD biplane is 38.3 %. The left ventricle has moderately decreased function. The left ventricle demonstrates global hypokinesis. The left ventricular internal cavity size was mildly dilated. There is mild concentric left ventricular hypertrophy of the basal segment. Left ventricular diastolic parameters are consistent with Grade II diastolic dysfunction (pseudonormalization).  2. Right ventricular systolic function is moderately reduced. The right ventricular size is moderately enlarged. There is normal pulmonary artery systolic pressure. The estimated right ventricular  systolic pressure is 21.5 mmHg.  3. Left atrial size was moderately dilated.  4. Right atrial size was mild to moderately dilated.  5. The mitral valve is abnormal. Moderate mitral valve regurgitation.  6. The aortic valve is tricuspid. There is mild calcification of the aortic valve. Aortic valve regurgitation is moderate.  7. Aortic dilatation noted. There is borderline dilatation of the ascending aorta, measuring 38 mm.  8. The inferior vena cava is normal in size with greater than 50% respiratory variability, suggesting right atrial pressure of 3 mmHg. Conclusion(s)/Recommendation(s): There is global LV hypokinesis with aneursymal deformity of the basal to mid inferior wall. No evidence of LV thrombus with definity . Would consider cMRI to further evaluate if clinically indicated. FINDINGS  Left Ventricle: There is global LV hypokinesis with aneursymal deformity of the basal to mid inferior wall. No evidence of LV thrombus with definity . Left ventricular ejection fraction, by estimation, is 35 to 40%. Left ventricular ejection fraction by 2D MOD biplane is 38.3 %. The left ventricle has moderately decreased function. The left ventricle demonstrates global hypokinesis. Definity  contrast agent was given IV to delineate the left ventricular endocardial borders. The left ventricular internal cavity size was mildly dilated. There is mild concentric left ventricular hypertrophy of the basal segment. Left ventricular diastolic parameters are consistent with Grade II diastolic dysfunction (pseudonormalization).  LV Wall Scoring: The basal inferior segment is aneurysmal. The anterior septum, mid inferoseptal segment, mid inferior segment, and basal inferoseptal segment are hypokinetic. The entire anterior wall, entire lateral wall, and entire apex are normal. Right Ventricle: The right ventricular size is moderately enlarged. No increase in right ventricular wall thickness. Right ventricular systolic function is  moderately reduced. There is normal pulmonary artery systolic pressure. The tricuspid regurgitant velocity is 2.15 m/s, and with an assumed right atrial pressure of 3 mmHg, the estimated right ventricular systolic pressure is 21.5 mmHg. Left Atrium: Left atrial size was moderately dilated. Right Atrium: Right atrial size was mild to moderately dilated. Pericardium: Trivial pericardial effusion is present. Mitral Valve: The mitral valve is abnormal. Moderate mitral valve regurgitation. Tricuspid Valve: The tricuspid valve is normal in structure. Tricuspid valve regurgitation is mild. Aortic Valve: The aortic valve is tricuspid. There is mild calcification of the aortic valve. Aortic valve regurgitation is moderate. Pulmonic Valve: The pulmonic valve was normal in structure. Pulmonic valve regurgitation is trivial. Aorta: The aortic root and ascending aorta are structurally normal, with no evidence of dilitation and aortic dilatation noted. There is borderline dilatation of the ascending aorta, measuring 38 mm. Venous: The inferior vena cava is normal in size with greater than 50% respiratory variability, suggesting right atrial pressure of 3 mmHg. IAS/Shunts: No atrial level shunt detected by color flow Doppler.  LEFT VENTRICLE PLAX 2D  Biplane EF (MOD) LVIDd:         5.80 cm         LV Biplane EF:   Left LVIDs:         4.60 cm                          ventricular LV PW:         1.10 cm                          ejection LV IVS:        0.80 cm                          fraction by LVOT diam:     2.00 cm                          2D MOD LV SV:         58                               biplane is LV SV Index:   29                               38.3 %. LVOT Area:     3.14 cm                                Diastology                                LV e' medial:    6.53 cm/s LV Volumes (MOD)               LV E/e' medial:  12.7 LV vol d, MOD    218.0 ml      LV e' lateral:   11.50 cm/s A2C:                            LV E/e' lateral: 7.2 LV vol d, MOD    173.0 ml A4C: LV vol s, MOD    132.0 ml A2C: LV vol s, MOD    113.0 ml A4C: LV SV MOD A2C:   86.0 ml LV SV MOD A4C:   173.0 ml LV SV MOD BP:    75.4 ml RIGHT VENTRICLE RV S prime:     7.94 cm/s TAPSE (M-mode): 1.8 cm LEFT ATRIUM             Index        RIGHT ATRIUM           Index LA diam:        4.40 cm 2.23 cm/m   RA Area:     23.00 cm LA Vol (A2C):   56.5 ml 28.65 ml/m  RA Volume:   70.10 ml  35.54 ml/m LA Vol (A4C):   76.3 ml 38.69 ml/m LA Biplane Vol: 68.3 ml 34.63 ml/m  AORTIC VALVE LVOT Vmax:   74.20 cm/s LVOT Vmean:  57.200 cm/s LVOT VTI:    0.185 m  AORTA Ao Root diam:  3.20 cm Ao Asc diam:  3.80 cm MITRAL VALVE               TRICUSPID VALVE MV Area (PHT): 3.00 cm    TR Peak grad:   18.5 mmHg MV Decel Time: 253 msec    TR Vmax:        215.00 cm/s MV E velocity: 82.80 cm/s MV A velocity: 43.00 cm/s  SHUNTS MV E/A ratio:  1.93        Systemic VTI:  0.18 m                            Systemic Diam: 2.00 cm Toribio Fuel MD Electronically signed by Toribio Fuel MD Signature Date/Time: 01/07/2024/4:33:29 PM    Final     Cardiac Studies   See above  Patient Profile     78 y.o. male admitted with sustained hemodynamically unstable VT in the setting of an ICM.  Assessment & Plan    VT - he has quited down. I discussed the treatment options with the patient and his wife and daughter and they would like to proceed with ICD insertion. Scheduled for Monday afternoon. ICM - he denies anginal symptoms. S/p CABG 2021.cath results noted. Coags - hold eliquis  going forward for ICD insertion on Monday. Do not give eliquis .   For questions or updates, please contact [facility] Please consult [email or web address] for contact info under Cardiology/STEMI.      Signed, Danelle Birmingham, MD  01/09/2024, 10:30 AM

## 2024-01-09 NOTE — Plan of Care (Signed)
  Problem: Safety: Goal: Ability to remain free from injury will improve Outcome: Progressing   Problem: Metabolic: Goal: Ability to maintain appropriate glucose levels will improve Outcome: Progressing   Problem: Cardiovascular: Goal: Ability to achieve and maintain adequate cardiovascular perfusion will improve Outcome: Progressing Goal: Vascular access site(s) Level 0-1 will be maintained Outcome: Completed/Met

## 2024-01-09 NOTE — Progress Notes (Signed)
 PHARMACY - ANTICOAGULATION CONSULT NOTE  Pharmacy Consult for heparin  Indication: chest pain/ACS and atrial fibrillation  Allergies  Allergen Reactions   Atorvastatin Other (See Comments)    Muscle pain   Sitagliptin Other (See Comments)    Myalgias    Patient Measurements: Height: 5' 10 (177.8 cm) Weight: 79.4 kg (175 lb 0.7 oz) IBW/kg (Calculated) : 73 HEPARIN  DW (KG): 79.4  Vital Signs: Temp: 98.2 F (36.8 C) (06/21 0430) Temp Source: Oral (06/21 0430) BP: 135/59 (06/21 0430) Pulse Rate: 48 (06/21 0430)  Labs: Recent Labs    01/06/24 0829 01/06/24 0908 01/06/24 1029 01/06/24 1801 01/06/24 2152 01/07/24 0235 01/07/24 0808 01/08/24 0405 01/09/24 0550  HGB 11.2*   < >  --   --   --  10.8*  --  10.4* 10.9*  HCT 36.8*   < >  --   --   --  34.0*  --  33.9* 34.2*  PLT 281  --   --   --   --  281  --  121* 259  APTT  --   --   --   --  31  --  68*  --  74*  HEPARINUNFRC  --   --   --   --  0.92* 1.02*  --   --  1.03*  CREATININE 1.74*  --   --  1.35*  --  1.64*  --  1.49*  --   TROPONINIHS 26*  --  349*  --   --   --   --   --   --    < > = values in this interval not displayed.    Estimated Creatinine Clearance: 42.2 mL/min (A) (by C-G formula based on SCr of 1.49 mg/dL (H)).   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 s/p CABG in 06/2020 with LIMA-LAD, SVG-D1 and SVG-RCA   COVID-07 Dec 2019   Dementia [facility])    Depression    Essential hypertension    Hernia, umbilical    HOH (hard of hearing)    Hyperlipidemia    Ischemic cardiomyopathy    Ischemic necrosis of small bowel (HCC)    Left ventricular mural thrombus    NSTEMI (non-ST elevated myocardial infarction) (HCC)    OSA on CPAP    Pulmonary emboli [facility])    May 2021   Skin cancer    Type 2 diabetes mellitus (HCC)     Assessment: 33 YOM presenting with V-tach, hx of  afib on Eliquis  PTA and he is s.p cath and apixaban  was restarted. Plans are for PPM on Monday so now to be on IV heparin    -last apixaban  dose ~ 9am on 6/20 -On heparin  for cath and last infusion rate was 1450 units/hr and aPTT was at goal  AM: aPTT within goal and no issues per RN. CBC shows Hgb low, stable and plts improvement to 259  Goal of Therapy:  Heparin  level 0.3-0.7 units/ml aPTT 66-102 seconds Monitor platelets by anticoagulation protocol: Yes   Plan:  -Continue heparin  1450 units/hr at 10pm -aPTT and heparin  level daily until correlate -Heparin  to stop at midnight on Sunday for PPM on Monday  Lynwood Poplar, PharmD, BCPS Clinical Pharmacist 01/09/2024 6:38 AM

## 2024-01-10 LAB — APTT: aPTT: 75 s — ABNORMAL HIGH (ref 24–36)

## 2024-01-10 LAB — GLUCOSE, CAPILLARY
Glucose-Capillary: 174 mg/dL — ABNORMAL HIGH (ref 70–99)
Glucose-Capillary: 189 mg/dL — ABNORMAL HIGH (ref 70–99)
Glucose-Capillary: 159 mg/dL — ABNORMAL HIGH (ref 70–99)
Glucose-Capillary: 181 mg/dL — ABNORMAL HIGH (ref 70–99)
Glucose-Capillary: 183 mg/dL — ABNORMAL HIGH (ref 70–99)

## 2024-01-10 LAB — CBC
HCT: 33.3 % — ABNORMAL LOW (ref 39.0–52.0)
Hemoglobin: 10.6 g/dL — ABNORMAL LOW (ref 13.0–17.0)
MCH: 28.3 pg (ref 26.0–34.0)
MCHC: 31.8 g/dL (ref 30.0–36.0)
MCV: 88.8 fL (ref 80.0–100.0)
Platelets: 249 10*3/uL (ref 150–400)
RBC: 3.75 MIL/uL — ABNORMAL LOW (ref 4.22–5.81)
RDW: 15.1 % (ref 11.5–15.5)
WBC: 6.3 10*3/uL (ref 4.0–10.5)
nRBC: 0 % (ref 0.0–0.2)

## 2024-01-10 LAB — BASIC METABOLIC PANEL WITH GFR
Anion gap: 3 — ABNORMAL LOW (ref 5–15)
BUN: 23 mg/dL (ref 8–23)
CO2: 22 mmol/L (ref 22–32)
Calcium: 8 mg/dL — ABNORMAL LOW (ref 8.9–10.3)
Chloride: 111 mmol/L (ref 98–111)
Creatinine, Ser: 1.63 mg/dL — ABNORMAL HIGH (ref 0.61–1.24)
GFR, Estimated: 43 mL/min — ABNORMAL LOW (ref >60)
Glucose, Bld: 176 mg/dL — ABNORMAL HIGH (ref 70–99)
Potassium: 4.3 mmol/L (ref 3.5–5.1)
Sodium: 136 mmol/L (ref 135–145)

## 2024-01-10 LAB — HEPARIN LEVEL (UNFRACTIONATED): Heparin Unfractionated: 0.61 [IU]/mL (ref 0.30–0.70)

## 2024-01-10 LAB — MAGNESIUM: Magnesium: 2 mg/dL (ref 1.7–2.4)

## 2024-01-10 MED ORDER — SODIUM CHLORIDE 0.9 % IV SOLN: INTRAVENOUS | Status: DC

## 2024-01-10 MED ORDER — SODIUM CHLORIDE 0.9% FLUSH: 3.0000 mL | INTRAVENOUS | Status: DC | PRN

## 2024-01-10 MED ORDER — SODIUM CHLORIDE 0.9% FLUSH
3.0000 mL | Freq: Two times a day (BID) | INTRAVENOUS | Status: DC
  Administered 2024-01-10 – 2024-01-11 (×2): 3 mL via INTRAVENOUS

## 2024-01-10 MED ORDER — CEFAZOLIN SODIUM-DEXTROSE 2-4 GM/100ML-% IV SOLN
2.0000 g | INTRAVENOUS | Status: AC
  Administered 2024-01-11: 2 g via INTRAVENOUS
  Filled 2024-01-10: qty 100

## 2024-01-10 NOTE — Plan of Care (Signed)
  Problem: Activity: Goal: Ability to tolerate increased activity will improve Outcome: Completed/Met   Problem: Cardiac: Goal: Ability to achieve and maintain adequate cardiovascular perfusion will improve Outcome: Progressing   Problem: Activity: Goal: Risk for activity intolerance will decrease Outcome: Completed/Met

## 2024-01-10 NOTE — Progress Notes (Addendum)
 Progress Note  Patient Name: Patrick Miranda Date of Encounter: 01/10/2024  Primary Cardiologist: Jayson Sierras, MD   Subjective    No chest pain or sob.   Inpatient Medications    Scheduled Meds:  amiodarone   200 mg Oral BID   aspirin  EC  81 mg Oral Daily   chlorhexidine   60 mL Topical Once   [START ON 01/11/2024] chlorhexidine   60 mL Topical Once   Chlorhexidine  Gluconate Cloth  6 each Topical Daily   donepezil   10 mg Oral QHS   fenofibrate   160 mg Oral q morning   [START ON 01/11/2024] gentamicin (GARAMYCIN) 80 mg in sodium chloride  0.9 % 500 mL irrigation  80 mg Irrigation On Call   insulin  aspart  0-15 Units Subcutaneous TID AC & HS   levothyroxine   50 mcg Oral q morning   loratadine   10 mg Oral Daily   memantine   10 mg Oral BID   mupirocin ointment  1 Application Nasal BID   rosuvastatin   20 mg Oral QPM   sertraline   25 mg Oral Daily   sodium chloride  flush  3 mL Intravenous Q12H   tamsulosin   0.4 mg Oral QHS   Continuous Infusions:  heparin  1,450 Units/hr (01/10/24 1002)   PRN Meds: acetaminophen , nitroGLYCERIN , ondansetron  (ZOFRAN ) IV, sodium chloride  flush   Vital Signs    Vitals:   01/09/24 1940 01/09/24 2322 01/10/24 0548 01/10/24 0747  BP: (!) 152/70 (!) 125/58 136/63 (!) 139/52  Pulse:  (!) 54 60 (!) 48  Resp: 18 16 16 16   Temp: 98.4 F (36.9 C) 98 F (36.7 C) 98 F (36.7 C) 97.9 F (36.6 C)  TempSrc: Oral Oral Oral Oral  SpO2: 97% 95% 97% 96%  Weight:      Height:        Intake/Output Summary (Last 24 hours) at 01/10/2024 1105 Last data filed at 01/10/2024 0950 Gross per 24 hour  Intake 1014 ml  Output 2050 ml  Net -1036 ml   Filed Weights   01/06/24 0838 01/07/24 1721  Weight: 79.4 kg 79.4 kg    Telemetry    Nsr with occasional PVC - Personally Reviewed  ECG    none - Personally Reviewed  Physical Exam   GEN: No acute distress.   Neck: No JVD Cardiac: RRR, no murmurs, rubs, or gallops.  Respiratory: Clear to  auscultation bilaterally. GI: Soft, nontender, non-distended  MS: No edema; No deformity. Neuro:  Nonfocal  Psych: Normal affect   Labs    Chemistry Recent Labs  Lab 01/06/24 0829 01/06/24 0908 01/08/24 0405 01/09/24 0550 01/10/24 0444  NA 142   < > 137 137 136  K 3.0*   < > 4.2 4.2 4.3  CL 113*   < > 109 107 111  CO2 19*   < > 22 22 22   GLUCOSE 232*   < > 149* 170* 176*  BUN 18   < > 22 23 23   CREATININE 1.74*   < > 1.49* 1.45* 1.63*  CALCIUM  8.5*   < > 8.2* 8.5* 8.0*  PROT 5.5*  --   --   --   --   ALBUMIN  2.8*  --   --   --   --   AST 45*  --   --   --   --   ALT 11  --   --   --   --   ALKPHOS 49  --   --   --   --  BILITOT 0.5  --   --   --   --   GFRNONAA 40*   < > 48* 49* 43*  ANIONGAP 10   < > 6 8 3*   < > = values in this interval not displayed.     Hematology Recent Labs  Lab 01/08/24 0405 01/09/24 0550 01/10/24 0444  WBC 6.0 7.0 6.3  RBC 3.67* 3.89* 3.75*  HGB 10.4* 10.9* 10.6*  HCT 33.9* 34.2* 33.3*  MCV 92.4 87.9 88.8  MCH 28.3 28.0 28.3  MCHC 30.7 31.9 31.8  RDW 15.1 14.8 15.1  PLT 121* 259 249    Cardiac EnzymesNo results for input(s): TROPONINI in the last 168 hours. No results for input(s): TROPIPOC in the last 168 hours.   BNPNo results for input(s): BNP, PROBNP in the last 168 hours.   DDimer No results for input(s): DDIMER in the last 168 hours.   Radiology    No results found.  Cardiac Studies   See above  Patient Profile     78 y.o. male admitted with VT in the setting of ICM  Assessment & Plan    VT - he has not had more VT. He will undergo ICD insertion tomorrow. Orders are written. He will need his IV heparin  stopped in the morning.  ICM - he denies anginal symptoms.   For questions or updates, please contact [facility] Please consult [email or web address] for contact info under Cardiology/STEMI.      Signed, Danelle Birmingham, MD  01/10/2024, 11:05 AM

## 2024-01-10 NOTE — Progress Notes (Signed)
 PHARMACY - ANTICOAGULATION CONSULT NOTE  Pharmacy Consult for heparin  Indication: chest pain/ACS and atrial fibrillation  Allergies  Allergen Reactions   Atorvastatin Other (See Comments)    Muscle pain   Sitagliptin Other (See Comments)    Myalgias    Patient Measurements: Height: 5' 10 (177.8 cm) Weight: 79.4 kg (175 lb 0.7 oz) IBW/kg (Calculated) : 73 HEPARIN  DW (KG): 79.4  Vital Signs: Temp: 98 F (36.7 C) (06/22 0548) Temp Source: Oral (06/22 0548) BP: 136/63 (06/22 0548) Pulse Rate: 60 (06/22 0548)  Labs: Recent Labs    01/07/24 0808 01/08/24 0405 01/08/24 0405 01/09/24 0550 01/10/24 0444  HGB  --  10.4*   < > 10.9* 10.6*  HCT  --  33.9*  --  34.2* 33.3*  PLT  --  121*  --  259 249  APTT 68*  --   --  74* 75*  HEPARINUNFRC  --   --   --  1.03* 0.61  CREATININE  --  1.49*  --  1.45* 1.63*   < > = values in this interval not displayed.    Estimated Creatinine Clearance: 38.6 mL/min (A) (by C-G formula based on SCr of 1.63 mg/dL (H)).   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 s/p CABG in 06/2020 with LIMA-LAD, SVG-D1 and SVG-RCA   COVID-07 Dec 2019   Dementia [facility])    Depression    Essential hypertension    Hernia, umbilical    HOH (hard of hearing)    Hyperlipidemia    Ischemic cardiomyopathy    Ischemic necrosis of small bowel (HCC)    Left ventricular mural thrombus    NSTEMI (non-ST elevated myocardial infarction) (HCC)    OSA on CPAP    Pulmonary emboli [facility])    May 2021   Skin cancer    Type 2 diabetes mellitus (HCC)     Assessment: 19 YOM presenting with V-tach, hx of afib on Eliquis  PTA and he is s/p cath and apixaban  was restarted. Plans are for ICD on Monday so now to be on IV heparin    aPTT therapeutic, heparin  level trending down (high from DOAC use), CBC ok. Heparin  to stop at midnight for device  tomorrow.  Goal of Therapy:  Heparin  level 0.3-0.7 units/ml aPTT 66-102 seconds Monitor platelets by anticoagulation protocol: Yes   Plan:  -Continue heparin  1450 units/hr - stop at midnight tonight   Ozell Jamaica, PharmD, BCPS, [facility] Clinical Pharmacist [phone] Please check AMION for all [facility] Pharm[facility] numbers 01/10/2024

## 2024-01-10 NOTE — Progress Notes (Signed)
Patient placed himself on home CPAP for the night.  

## 2024-01-11 ENCOUNTER — Encounter (HOSPITAL_COMMUNITY)
Admission: EM | Disposition: A | Discharge status: Home Health | Payer: Self-pay | Source: Home / Self Care | Attending: Internal Medicine

## 2024-01-11 ENCOUNTER — Other Ambulatory Visit: Payer: Self-pay

## 2024-01-11 HISTORY — PX: ICD IMPLANT: EP1208

## 2024-01-11 LAB — CBC
HCT: 37.5 % — ABNORMAL LOW (ref 39.0–52.0)
Hemoglobin: 11.6 g/dL — ABNORMAL LOW (ref 13.0–17.0)
MCH: 28 pg (ref 26.0–34.0)
MCHC: 30.9 g/dL (ref 30.0–36.0)
MCV: 90.4 fL (ref 80.0–100.0)
Platelets: 255 10*3/uL (ref 150–400)
RBC: 4.15 MIL/uL — ABNORMAL LOW (ref 4.22–5.81)
RDW: 15.2 % (ref 11.5–15.5)
WBC: 5.3 10*3/uL (ref 4.0–10.5)
nRBC: 0 % (ref 0.0–0.2)

## 2024-01-11 LAB — BASIC METABOLIC PANEL WITH GFR
Anion gap: 10 (ref 5–15)
BUN: 26 mg/dL — ABNORMAL HIGH (ref 8–23)
CO2: 20 mmol/L — ABNORMAL LOW (ref 22–32)
Calcium: 8.5 mg/dL — ABNORMAL LOW (ref 8.9–10.3)
Chloride: 107 mmol/L (ref 98–111)
Creatinine, Ser: 1.39 mg/dL — ABNORMAL HIGH (ref 0.61–1.24)
GFR, Estimated: 52 mL/min — ABNORMAL LOW (ref >60)
Glucose, Bld: 278 mg/dL — ABNORMAL HIGH (ref 70–99)
Potassium: 4.4 mmol/L (ref 3.5–5.1)
Sodium: 137 mmol/L (ref 135–145)

## 2024-01-11 LAB — GLUCOSE, CAPILLARY
Glucose-Capillary: 255 mg/dL — ABNORMAL HIGH (ref 70–99)
Glucose-Capillary: 127 mg/dL — ABNORMAL HIGH (ref 70–99)
Glucose-Capillary: 122 mg/dL — ABNORMAL HIGH (ref 70–99)
Glucose-Capillary: 166 mg/dL — ABNORMAL HIGH (ref 70–99)

## 2024-01-11 LAB — MAGNESIUM: Magnesium: 1.9 mg/dL (ref 1.7–2.4)

## 2024-01-11 SURGERY — ICD IMPLANT

## 2024-01-11 MED ORDER — FENTANYL CITRATE (PF) 100 MCG/2ML IJ SOLN
INTRAMUSCULAR | Status: AC
Start: 2024-01-11 — End: 2024-01-11
  Filled 2024-01-11: qty 2

## 2024-01-11 MED ORDER — LIDOCAINE HCL (PF) 1 % IJ SOLN
INTRAMUSCULAR | Status: DC | PRN
  Administered 2024-01-11: 60 mL

## 2024-01-11 MED ORDER — CEFAZOLIN SODIUM-DEXTROSE 2-4 GM/100ML-% IV SOLN
INTRAVENOUS | Status: AC
  Filled 2024-01-11: qty 100

## 2024-01-11 MED ORDER — HEPARIN (PORCINE) IN NACL 1000-0.9 UT/500ML-% IV SOLN
INTRAVENOUS | Status: DC | PRN
  Administered 2024-01-11: 500 mL

## 2024-01-11 MED ORDER — MIDAZOLAM HCL 2 MG/2ML IJ SOLN
INTRAMUSCULAR | Status: AC
Start: 2024-01-11 — End: 2024-01-11
  Filled 2024-01-11: qty 2

## 2024-01-11 MED ORDER — LIDOCAINE HCL (PF) 1 % IJ SOLN
INTRAMUSCULAR | Status: AC
  Filled 2024-01-11: qty 30

## 2024-01-11 MED ORDER — SODIUM CHLORIDE 0.9 % IV SOLN
INTRAVENOUS | Status: AC
  Filled 2024-01-11: qty 2

## 2024-01-11 MED ORDER — MIDAZOLAM HCL 5 MG/5ML IJ SOLN
INTRAMUSCULAR | Status: DC | PRN
  Administered 2024-01-11: .5 mg via INTRAVENOUS

## 2024-01-11 MED ORDER — FENTANYL CITRATE (PF) 100 MCG/2ML IJ SOLN
INTRAMUSCULAR | Status: DC | PRN
  Administered 2024-01-11: 12.5 ug via INTRAVENOUS

## 2024-01-11 SURGICAL SUPPLY — 7 items
CABLE SURGICAL S-101-97-12 (CABLE) ×1 IMPLANT
ICD COBALT XT VR DVPA2D4 (ICD Generator) IMPLANT
LEAD SPRINT QUAT SEC 6935M-62 (Lead) IMPLANT
PAD DEFIB RADIO PHYSIO CONN (PAD) ×1 IMPLANT
SHEATH 9FR PRELUDE SNAP 13 (SHEATH) IMPLANT
SHEATH PROBE COVER 6X72 (BAG) IMPLANT
TRAY PACEMAKER INSERTION (PACKS) ×1 IMPLANT

## 2024-01-11 NOTE — TOC Progression Note (Signed)
 Transition of Care [facility]) - Progression Note    Patient Details  Name: Patrick Miranda MRN: [id number] Date of Birth: 05-18-1946  [facility] of [facility]) CM/SW Contact  Robynn Eileen Hoose, RN Phone Number: 01/11/2024, 11:08 AM  Clinical Narrative:   Progression rounds update, pt to get ICD placement today. Possible discharge home tomorrow. PT/OT recommendations no follow up.    Expected Discharge Plan: Home w Home Health Services Barriers to Discharge: Continued Medical Work up  Expected Discharge Plan and Services   Discharge Planning Services: CM Consult Post Acute Care Choice: Home Health Living arrangements for the past 2 months: Single Family Home                                       Social Determinants of Health (SDOH) Interventions SDOH Screenings   Food Insecurity: No Food Insecurity (01/06/2024)  Housing: Low Risk  (01/06/2024)  Transportation Needs: No Transportation Needs (01/06/2024)  Utilities: Not At Risk (01/06/2024)  Alcohol  Screen: Low Risk  (05/23/2021)  Depression (PHQ2-9): Low Risk  (01/19/2020)  Social Connections: Socially Integrated (01/06/2024)  Tobacco Use: Low Risk  (01/06/2024)    Readmission Risk Interventions    05/20/2021    3:14 PM 05/20/2021   11:15 AM  Readmission Risk Prevention Plan  Transportation Screening Complete Complete  Medication Review [employer]) Complete Complete  PCP or Specialist appointment within 3-5 days of discharge Complete Not Complete  HRI or Home Care Consult  Complete  SW Recovery Care/Counseling Consult  Complete  Palliative Care Screening  Not Complete  Comments  Ordered  Skilled Nursing Facility Complete Not Complete  SNF Comments  PT pending

## 2024-01-11 NOTE — Discharge Instructions (Addendum)
 After Your ICD (Implantable Cardiac Defibrillator)   You have a Medtronic ICD  ACTIVITY Do not lift your arm above shoulder height for 1 week after your procedure. After 7 days, you may progress as below.  You should remove your sling 24 hours after your procedure, unless otherwise instructed by your provider.     Monday January 18, 2024  Tuesday January 19, 2024 Wednesday January 20, 2024 Thursday January 21, 2024   Do not lift, push, pull, or carry anything over 10 pounds with the affected arm until 6 weeks (Monday February 22, 2024 ) after your procedure.   You may drive AFTER your wound check, unless you have been told otherwise by your provider.   Ask your healthcare provider when you can go back to work   INCISION/Dressing HOLD Eliquis  for 5 days post procedure.  OK to resume on 01/16/24  If large square, outer bandage is left in place, this can be removed after 24 hours from your procedure. Do not remove steri-strips or glue as below.   Monitor your defibrillator site for redness, swelling, and drainage. Call the device clinic at [phone] if you experience these symptoms or fever/chills.  If your incision is sealed with Steri-strips or staples, you may shower 7 days after your procedure or when told by your provider. Do not remove the steri-strips or let the shower hit directly on your site. You may wash around your site with soap and water.    If you were discharged in a sling, please do not wear this during the day more than 48 hours after your surgery unless otherwise instructed. This may increase the risk of stiffness and soreness in your shoulder.   Avoid lotions, ointments, or perfumes over your incision until it is well-healed.  You may use a hot tub or a pool AFTER your wound check appointment if the incision is completely closed.  Your ICD is designed to protect you from life threatening heart rhythms. Because of this, you may receive a shock.   1 shock with no symptoms:  Call  the office during business hours. 1 shock with symptoms (chest pain, chest pressure, dizziness, lightheadedness, shortness of breath, overall feeling unwell):  Call 911. If you experience 2 or more shocks in 24 hours:  Call 911. If you receive a shock, you should not drive for 6 months per the [place] DMV IF you receive appropriate therapy from your ICD.   ICD Alerts:  Some alerts are vibratory and others beep. These are NOT emergencies. Please call our office to let us  know. If this occurs at night or on weekends, it can wait until the next business day. Send a remote transmission.  If your device is capable of reading fluid status (for heart failure), you will be offered monthly monitoring to review this with you.   DEVICE MANAGEMENT Remote monitoring is used to monitor your ICD from home. This monitoring is scheduled every 91 days by our office. It allows us  to keep an eye on the functioning of your device to ensure it is working properly. You will routinely see your Electrophysiologist annually (more often if necessary).   You should receive your ID card for your new device in 4-8 weeks. Keep this card with you at all times once received. Consider wearing a medical alert bracelet or necklace.  Your ICD  may be MRI compatible. This will be discussed at your next office visit/wound check.  You should avoid contact with strong electric or magnetic  fields.   Do not use amateur (ham) radio equipment or electric (arc) welding torches. MP3 player headphones with magnets should not be used. Some devices are safe to use if held at least 12 inches (30 cm) from your defibrillator. These include power tools, lawn mowers, and speakers. If you are unsure if something is safe to use, ask your health care provider.  When using your cell phone, hold it to the ear that is on the opposite side from the defibrillator. Do not leave your cell phone in a pocket over the defibrillator.  You may safely use electric  blankets, heating pads, computers, and microwave ovens.  Call the office right away if: You have chest pain. You feel more than one shock. You feel more short of breath than you have felt before. You feel more light-headed than you have felt before. Your incision starts to open up.  This information is not intended to replace advice given to you by your health care provider. Make sure you discuss any questions you have with your health care provider.

## 2024-01-11 NOTE — Progress Notes (Signed)
  Patient Name: Patrick Miranda Date of Encounter: 01/11/2024  Primary Cardiologist: Jayson Sierras, MD Electrophysiologist: None  Interval Summary   Pt denies chest pain, SOB. No specific questions regarding pending procedure.  No family at bedside currently.   Vital Signs    Vitals:   01/10/24 1931 01/10/24 2315 01/11/24 0530 01/11/24 0727  BP: 139/61 (!) 137/53 (!) 150/54 (!) 150/61  Pulse: (!) 55 (!) 55 (!) 47 (!) 48  Resp: 18 16 15 16   Temp: 98 F (36.7 C) 98 F (36.7 C)  97.7 F (36.5 C)  TempSrc: Oral Oral  Axillary  SpO2:  97%    Weight:      Height:        Intake/Output Summary (Last 24 hours) at 01/11/2024 0732 Last data filed at 01/11/2024 9271 Gross per 24 hour  Intake 1739.31 ml  Output 1850 ml  Net -110.69 ml   Filed Weights   01/06/24 0838 01/07/24 1721  Weight: 79.4 kg 79.4 kg    Physical Exam    GEN- pleasant, well appearing elderly male, alert  / interacts appropriately.   Lungs- Clear to ausculation bilaterally, normal work of breathing Cardiac- Regular rate and rhythm, no murmurs, rubs or gallops GI- soft, NT, ND, + BS Extremities- no clubbing or cyanosis. No edema  Telemetry    SB50's - SR70's, occ PVC, no VT/NSVT (personally reviewed)  Hospital Course    Patrick Miranda is a 78 y.o. male with PMH of CAD s/p CABG (2021), ICM, AF, HTN, HLD, PE, OSA on CPAP, DM II, brainstem CVA, dementia admitted 01/06/24 for VT arrest in setting of ICM.  LHC 6/19 showed patent LIMA to mLAD, but occluded LAD after the graft insertion site. Patent SVG to distal RCA. Medical therapy recommended. ECHO with LVEF 35-40%. Palliative Care consulted as family was not initially certain they wanted ICD > ultimately elected for ICD & AAD.    Assessment & Plan    VT Arrest  Chronic Systolic HF In setting of ICM, likely scare mediated. Initially treated with amiodarone  + mexiletine.  -NPO after 0900 -heparin  off at [place] -pending ICD insertion this afternoon   -continue amiodarone  200 mg BID  ICM  CAD s/p CABG 2021 -no anginal symptoms  -LHC with rec's for medical mgmt / no targets felt appropriate for PCI   Paroxysmal AF  -heparin  stopped MN for ICD insertion  -tele monitoring / NSR    Medical Readiness Date: 01/09/2024    For questions or updates, please contact [facility] Please consult [email or web address] for contact info under     Signed, Daphne Barrack, NP-C, AGACNP-BC [facility] - Electrophysiology  01/11/2024, 7:32 AM

## 2024-01-11 NOTE — Progress Notes (Addendum)
  Progress Note  Patient Name: Patrick Miranda Date of Encounter: 01/11/2024 [facility] Cardiologist: Jayson Sierras, MD   Interval Summary   No CV complaints  Vital Signs Vitals:   01/10/24 1931 01/10/24 2315 01/11/24 0530 01/11/24 0727  BP: 139/61 (!) 137/53 (!) 150/54 (!) 150/61  Pulse: (!) 55 (!) 55 (!) 47 (!) 48  Resp: 18 16 15 16   Temp: 98 F (36.7 C) 98 F (36.7 C)  97.7 F (36.5 C)  TempSrc: Oral Oral  Axillary  SpO2:  97%    Weight:      Height:        Intake/Output Summary (Last 24 hours) at 01/11/2024 0829 Last data filed at 01/11/2024 9271 Gross per 24 hour  Intake 1739.31 ml  Output 1700 ml  Net 39.31 ml      01/07/2024    5:21 PM 01/06/2024    8:38 AM 01/04/2024    9:00 AM  Last 3 Weights  Weight (lbs) 175 lb 0.7 oz 175 lb 175 lb 6.4 oz  Weight (kg) 79.4 kg 79.379 kg 79.561 kg      Telemetry/ECG  Sinus bradycardia - Personally Reviewed  Physical Exam  GEN: No acute distress.   Neck: No JVD Cardiac: RRR, no murmurs, rubs, or gallops.  Respiratory: Clear to auscultation bilaterally. GI: Soft, nontender, non-distended  MS: No edema  Assessment & Plan   78 yo w CAD s/p CABG and moderate LV dysfunction with compensated systolic HF (EF 35-40%) admitted with VT arrest. For secondary prevention ICD implantation later today, on amiodarone . Euvolemic clinically. Need to resume losartan  and [name] . Currently on IV heparin . Restart oral anticoagulation after ICD implanted.  Medical Readiness Date: 01/12/2024    For questions or updates, please contact [facility] Please consult [email or web address] for contact info under       Signed, Jerel Balding, MD

## 2024-01-11 NOTE — Progress Notes (Signed)
 Palliative:  ***  Yong Channel, NP Palliative Medicine Team Pager [phone] (Please see [email or web address] for schedule) Team Phone [phone]

## 2024-01-12 ENCOUNTER — Inpatient Hospital Stay (HOSPITAL_COMMUNITY)

## 2024-01-12 ENCOUNTER — Encounter (HOSPITAL_COMMUNITY): Payer: Self-pay | Admitting: Cardiology

## 2024-01-12 LAB — CBC
HCT: 32.2 % — ABNORMAL LOW (ref 39.0–52.0)
Hemoglobin: 10.3 g/dL — ABNORMAL LOW (ref 13.0–17.0)
MCH: 28.5 pg (ref 26.0–34.0)
MCHC: 32 g/dL (ref 30.0–36.0)
MCV: 89 fL (ref 80.0–100.0)
Platelets: 250 10*3/uL (ref 150–400)
RBC: 3.62 MIL/uL — ABNORMAL LOW (ref 4.22–5.81)
RDW: 15.3 % (ref 11.5–15.5)
WBC: 6 10*3/uL (ref 4.0–10.5)
nRBC: 0 % (ref 0.0–0.2)

## 2024-01-12 LAB — BASIC METABOLIC PANEL WITH GFR
Anion gap: 5 (ref 5–15)
BUN: 24 mg/dL — ABNORMAL HIGH (ref 8–23)
CO2: 22 mmol/L (ref 22–32)
Calcium: 7.9 mg/dL — ABNORMAL LOW (ref 8.9–10.3)
Chloride: 107 mmol/L (ref 98–111)
Creatinine, Ser: 1.51 mg/dL — ABNORMAL HIGH (ref 0.61–1.24)
GFR, Estimated: 47 mL/min — ABNORMAL LOW (ref >60)
Glucose, Bld: 280 mg/dL — ABNORMAL HIGH (ref 70–99)
Potassium: 4.5 mmol/L (ref 3.5–5.1)
Sodium: 134 mmol/L — ABNORMAL LOW (ref 135–145)

## 2024-01-12 LAB — GLUCOSE, CAPILLARY
Glucose-Capillary: 235 mg/dL — ABNORMAL HIGH (ref 70–99)
Glucose-Capillary: 205 mg/dL — ABNORMAL HIGH (ref 70–99)

## 2024-01-12 LAB — MAGNESIUM: Magnesium: 1.8 mg/dL (ref 1.7–2.4)

## 2024-01-12 MED ORDER — EMPAGLIFLOZIN 10 MG PO TABS
10.0000 mg | ORAL_TABLET | Freq: Every day | ORAL | Status: DC
  Administered 2024-01-12: 10 mg via ORAL
  Filled 2024-01-12: qty 1

## 2024-01-12 MED ORDER — AMIODARONE HCL 200 MG PO TABS: ORAL_TABLET | ORAL | 3 refills | Status: DC

## 2024-01-12 MED ORDER — LOSARTAN POTASSIUM 25 MG PO TABS
12.5000 mg | ORAL_TABLET | Freq: Every day | ORAL | Status: DC
  Administered 2024-01-12: 12.5 mg via ORAL
  Filled 2024-01-12: qty 1

## 2024-01-12 NOTE — Care Management Important Message (Signed)
 Important Message  Patient Details  Name: Zi Newbury Stimpson MRN: [id number] Date of Birth: 1945/10/14   Important Message Given:  Yes - Medicare IM     Vonzell Arrie Sharps 01/12/2024, 10:23 AM

## 2024-01-12 NOTE — Progress Notes (Signed)
  Progress Note  Patient Name: Patrick Miranda Date of Encounter: 01/12/2024 [facility] Cardiologist: Jayson Sierras, MD   Interval Summary   Uncomplicated ICD implantation yesterday.  Feels well.  Clinically euvolemic. Chest x-ray shows clear lung fields without evidence of congestive heart failure and normal position of the ICD lead, no pneumothorax. No interval ventricular arrhythmia of note.  Vital Signs Vitals:   01/11/24 2330 01/12/24 0033 01/12/24 0603 01/12/24 0819  BP: (!) 133/55 (!) 138/58 (!) 139/55 (!) 149/66  Pulse: 62 76 (!) 49 63  Resp: 16  19 18   Temp: 98.3 F (36.8 C)  98 F (36.7 C) 97.6 F (36.4 C)  TempSrc: Oral  Oral Oral  SpO2: 94% 96% 97% 97%  Weight:      Height:        Intake/Output Summary (Last 24 hours) at 01/12/2024 0927 Last data filed at 01/12/2024 0000 Gross per 24 hour  Intake 1256.45 ml  Output 1995 ml  Net -738.55 ml      01/07/2024    5:21 PM 01/06/2024    8:38 AM 01/04/2024    9:00 AM  Last 3 Weights  Weight (lbs) 175 lb 0.7 oz 175 lb 175 lb 6.4 oz  Weight (kg) 79.4 kg 79.379 kg 79.561 kg      Telemetry/ECG  Sinus bradycardia/normal sinus rhythm- Personally Reviewed  Physical Exam  GEN: No acute distress.   Neck: No JVD Cardiac: RRR, no murmurs, rubs, or gallops.  ICD site appears healthy. Respiratory: Clear to auscultation bilaterally. GI: Soft, nontender, non-distended  MS: No edema  Assessment & Plan  Restart losartan  and [name] . Restart oral anticoagulant based on implanting physicians recommendations. Expect to be ready for discharge today pending final evaluation by PT. Medical Readiness Date: 01/09/2024    For questions or updates, please contact [facility] Please consult [email or web address] for contact info under       Signed, Jerel Balding, MD

## 2024-01-12 NOTE — TOC Transition Note (Addendum)
 Transition of Care [facility]) - Discharge Note   Patient Details  Name: Patrick Miranda MRN: [id number] Date of Birth: 1946-02-08  Transition of Care [facility]) CM/SW Contact:  Justina Delcia Czar, RN Phone Number: [phone] 01/12/2024, 1:38 PM   Clinical Narrative:     TOC CM spoke to pt and wife at bedside. Medicare.gov list with ratings placed on chart and giving to pt. Pt requested [facility]. Contacted rep, Lynette with new referral. Pt has scale at home for daily weights. Provided Living Better with HF and discussed low sodium/heart healthy diet.   Contacted PCP office. Left message with [place] office manager to arrange PCP follow up and call pt with appt time.   Wife will provide transportation home.   Final next level of care: Home w Home Health Services Barriers to Discharge: No Barriers Identified   Patient Goals and CMS Choice Patient states their goals for this hospitalization and ongoing recovery are:: wants to remain independent CMS Medicare.gov Compare Post Acute Care list provided to:: Patient Represenative (must comment) Choice offered to / list presented to : Spouse      Discharge Placement                       Discharge Plan and Services Additional resources added to the After Visit Summary for     Discharge Planning Services: CM Consult Post Acute Care Choice: Home Health                    HH Arranged: RN, PT [facility] Agency: [facility] Date [facility] Agency Contacted: 01/12/24 Time HH Agency Contacted: 1338 Representative spoke with at [facility] Agency: Arna  Social Drivers of Health (SDOH) Interventions SDOH Screenings   Food Insecurity: No Food Insecurity (01/06/2024)  Housing: Low Risk  (01/06/2024)  Transportation Needs: No Transportation Needs (01/06/2024)  Utilities: Not At Risk (01/06/2024)  Alcohol  Screen: Low Risk  (05/23/2021)  Depression (PHQ2-9): Low Risk  (01/19/2020)  Social Connections: Socially Integrated (01/06/2024)  Tobacco Use: Low  Risk  (01/06/2024)     Readmission Risk Interventions    05/20/2021    3:14 PM 05/20/2021   11:15 AM  Readmission Risk Prevention Plan  Transportation Screening Complete Complete  Medication Review Oceanographer) Complete Complete  PCP or Specialist appointment within 3-5 days of discharge Complete Not Complete  HRI or Home Care Consult  Complete  SW Recovery Care/Counseling Consult  Complete  Palliative Care Screening  Not Complete  Comments  Ordered  Skilled Nursing Facility Complete Not Complete  SNF Comments  PT pending

## 2024-01-12 NOTE — Discharge Summary (Signed)
 ELECTROPHYSIOLOGY PROCEDURE DISCHARGE SUMMARY    Patient ID: Patrick Miranda,  MRN: [id number], DOB/AGE: 19-Sep-1945 78 y.o.  Admit date: 01/06/2024 Discharge date: 01/12/2024  Primary Care Physician: Shepard Ade, MD  Primary Cardiologist: Jayson Sierras, MD  Electrophysiologist: Dr. Cindie    Primary Diagnosis:  Ventricular Tachycardia leading to Cardiac Arrest  Chronic Systolic CHF / ICM   Secondary Diagnosis: CAD  Paroxysmal Atrial Fibrillation    Allergies  Allergen Reactions   Atorvastatin Other (See Comments)    Muscle pain   Sitagliptin Other (See Comments)    Myalgias     Procedures This Admission:  1.  Implantation of a Medtronic single chamber ICD on 01/11/24 by Dr. Cindie.  The patient received a Medtronic Cobalt XT VR [id number] with a Medtronic Sprint Quattro Secure [id number] right ventricular lead. Pt did not require a LV or Left bundle Lead. SABRA DFTs were deferred at time of implant There were no post procedure complications 2.  CXR on 01/12/24 demonstrated no pneumothorax status post device implantation.      Brief HPI:  Patrick Miranda is a 78 y.o. male with PMH of CAD s/p CABG (2021), ICM, AF, HTN, HLD, PE, OSA on CPAP, DM II, brainstem CVA, dementia admitted 01/06/24 for VT arrest in setting of ICM.   Electrophysiology was consulted for consideration of ICD implantation.  Past medical history includes above.  LHC 6/19 showed patent LIMA to mLAD, but occluded LAD after the graft insertion site. Patent SVG to distal RCA. Medical therapy recommended. ECHO with LVEF 35-40%. Palliative Care consulted as family was not initially certain they wanted ICD > ultimately elected for ICD & AAD. Risks, benefits, and alternatives to ICD implantation were reviewed with the patient who wished to proceed.   Hospital Course:  The patient was admitted and underwent implantation of a Medtronic single chamber ICD with details as outlined above. They were monitored on  telemetry overnight which demonstrated NSR .  Left chest was without hematoma or ecchymosis.  The device was interrogated and found to be functioning normally.  CXR was obtained and demonstrated no pneumothorax status post device implantation..  Wound care, arm mobility, and restrictions were reviewed with the patient.  The patient was examined and considered stable for discharge to home.   The patient's discharge medications include an ARB. He was started on amiodarone  for VT.   Anticoagulation resumption This patient should resume their Eliquis  on 01/17/24  Physical Exam: Vitals:   01/12/24 0033 01/12/24 0603 01/12/24 0819 01/12/24 1218  BP: (!) 138/58 (!) 139/55 (!) 149/66 (!) 135/52  Pulse: 76 (!) 49 63 (!) 55  Resp:  19 18 20   Temp:  98 F (36.7 C) 97.6 F (36.4 C) 98 F (36.7 C)  TempSrc:  Oral Oral Oral  SpO2: 96% 97% 97%   Weight:      Height:        GEN- NAD. A&O x 3.  HEENT: Normocephalic, atraumatic Lungs- CTAB, normal effort.  Heart- RRR. No M/G/R.  GI- Soft, NT, ND.  Extremities- No clubbing, cyanosis, or edema Skin- Warm and dry, no rash or lesion. ICD site with steri-strips in place, clean / dry, scant amt old blood under strips  Discharge Medications:  Allergies as of 01/12/2024       Reactions   Atorvastatin Other (See Comments)   Muscle pain   Sitagliptin Other (See Comments)   Myalgias        Medication List  PAUSE taking these medications    apixaban  5 MG Tabs tablet Wait to take this until: January 17, 2024 Morning Commonly known as: Eliquis  TAKE 1 TABLET(5 MG) BY MOUTH TWICE DAILY       STOP taking these medications    azithromycin  250 MG tablet Commonly known as: ZITHROMAX        TAKE these medications    amiodarone  200 MG tablet Commonly known as: PACERONE  200 mg BID x 7 days, then 200 mg daily   BD Pen Needle Nano U/F 32G X 4 MM Misc Generic drug: Insulin  Pen Needle 1 each by Other route daily.   donepezil  10 MG  tablet Commonly known as: ARICEPT  Take 1 tablet (10 mg total) by mouth at bedtime.   ENTYVIO  IV Inject into the vein every 28 (twenty-eight) days.   fenofibrate  160 MG tablet Take 160 mg by mouth every morning.   FreeStyle Libre 3 Plus Sensor Misc by Does not apply route. Change sensor every 15 days.   HAIR SKIN NAILS GUMMIES PO Take 2 tablets by mouth daily.   IRON PO Take 1 tablet by mouth daily.   [name]  10 MG Tabs tablet Generic drug: empagliflozin  Take 1 tablet (10 mg total) by mouth daily. What changed: when to take this   levothyroxine  50 MCG tablet Commonly known as: SYNTHROID  Take 50 mcg by mouth every morning.   losartan  25 MG tablet Commonly known as: COZAAR  TAKE 1/2 TABLET(12.5 MG) BY MOUTH DAILY What changed: See the new instructions.   memantine  10 MG tablet Commonly known as: NAMENDA  Take 1 tablet (10 mg total) by mouth 2 (two) times daily.   metFORMIN  500 MG tablet Commonly known as: GLUCOPHAGE  Take 500 mg by mouth 2 (two) times daily with a meal.   multivitamin with minerals Tabs tablet Take 1 tablet by mouth daily.   NOVOLOG  FLEXPEN [place] Inject 8-10 Units into the skin 3 (three) times daily before meals. Dose per sliding scale   rosuvastatin  20 MG tablet Commonly known as: CRESTOR  TAKE 1 TABLET(20 MG) BY MOUTH DAILY What changed: See the new instructions.   sertraline  25 MG tablet Commonly known as: ZOLOFT  Take 1 tablet (25 mg total) by mouth daily.   tamsulosin  0.4 MG Caps capsule Commonly known as: FLOMAX  Take 0.4 mg by mouth at bedtime.   Tresiba FlexTouch 100 UNIT/ML FlexTouch Pen Generic drug: insulin  degludec Inject 32 Units into the skin daily.   Vitamin B-12 5000 MCG Subl Place 1 tablet under the tongue daily.        Disposition: Home with usual follow up as in AVS  Duration of Discharge Encounter:  APP time: 32 minutes  Signed, Daphne Barrack, NP-C, AGACNP-BC [facility] - Electrophysiology  01/12/2024,  1:01 PM

## 2024-01-12 NOTE — Progress Notes (Signed)
 Occupational Therapy Treatment Patient Details Name: Patrick Miranda MRN: [id number] DOB: [birth year] Today's Date: 01/12/2024   History of present illness 78 y.o. male presents to [facility] hospital on 01/06/2024 with vtach. ICD placement 6/23. PMH includes CABG, dementia, depression, HTN, HLD, PAF, ischemic cardiomyopathy, PE, OSA, DMII, CVA.   OT comments  Pt seen for first OT session s/p ICD placement. Provided education on ROM/lifting restrictions and ADL considerations. Wife present, supportive and able to provide assistance with ADLs/IADLs as needed. Pt able to manage ADLs and in-room mobility assessed without physical assistance though mild unsteadiness noted. Wife inquiring about HHPT follow up at [facility] for gait/balance.      If plan is discharge home, recommend the following:  A little help with bathing/dressing/bathroom;Assist for transportation;Direct supervision/assist for medications management;Assistance with cooking/housework   Equipment Recommendations  None recommended by OT    Recommendations for Other Services      Precautions / Restrictions Precautions Precautions: ICD/Pacemaker Recall of Precautions/Restrictions: Impaired Restrictions Weight Bearing Restrictions Per Provider Order: No       Mobility Bed Mobility Overal bed mobility: Modified Independent                  Transfers Overall transfer level: Needs assistance Equipment used: None Transfers: Sit to/from Stand Sit to Stand: Supervision                 Balance Overall balance assessment: Mild deficits observed, not formally tested                                         ADL either performed or assessed with clinical judgement   ADL Overall ADL's : Needs assistance/impaired     Grooming: Supervision/safety;Standing;Oral care;Wash/dry face Grooming Details (indicate cue type and reason): standing at sink > 6 min, no LOB. minor cues to avoid reaching with LUE, pushing  on sink, etc             Lower Body Dressing: Supervision/safety;Sit to/from stand Lower Body Dressing Details (indicate cue type and reason): able to don socks easily             Functional mobility during ADLs: Contact guard assist General ADL Comments: Minor unsteadiness without AD for mobility. Provided education on ICD precautions - allowed movements, aoviding lifting/pushing/pulling with LUE but able to complete most ADLs without restrictions. Discussed where wife may have to assist (present during session).    Extremity/Trunk Assessment Upper Extremity Assessment Upper Extremity Assessment: Overall WFL for tasks assessed;Right hand dominant   Lower Extremity Assessment Lower Extremity Assessment: Defer to PT evaluation        Vision   Vision Assessment?: No apparent visual deficits   Perception     Praxis     Communication Communication Communication: No apparent difficulties   Cognition Arousal: Alert Behavior During Therapy: WFL for tasks assessed/performed Cognition: History of cognitive impairments             OT - Cognition Comments: Hx of dementia pt A & O x4, decreased STM, overall WFL                 Following commands: Intact        Cueing   Cueing Techniques: Verbal cues  Exercises      Shoulder Instructions       General Comments Wife present during session    Pertinent Vitals/ Pain  Pain Assessment Pain Assessment: No/denies pain  Home Living                                          Prior Functioning/Environment              Frequency  Min 2X/week        Progress Toward Goals  OT Goals(current goals can now be found in the care plan section)  Progress towards OT goals: Progressing toward goals  Acute Rehab OT Goals Patient Stated Goal: home soon OT Goal Formulation: With patient Time For Goal Achievement: 01/22/24 Potential to Achieve Goals: Good ADL Goals Pt Will Perform  Grooming: with modified independence;standing Pt Will Perform Upper Body Dressing: with modified independence;sitting Pt Will Perform Lower Body Dressing: with modified independence;sit to/from stand Pt Will Transfer to Toilet: with modified independence;ambulating;regular height toilet Pt Will Perform Toileting - Clothing Manipulation and hygiene: with modified independence;sit to/from stand  Plan      Co-evaluation                 AM-PAC OT 6 Clicks Daily Activity     Outcome Measure   Help from another person eating meals?: None Help from another person taking care of personal grooming?: A Little Help from another person toileting, which includes using toliet, bedpan, or urinal?: A Little Help from another person bathing (including washing, rinsing, drying)?: A Little Help from another person to put on and taking off regular upper body clothing?: A Little Help from another person to put on and taking off regular lower body clothing?: A Little 6 Click Score: 19    End of Session Equipment Utilized During Treatment: Gait belt  OT Visit Diagnosis: Unsteadiness on feet (R26.81);Other abnormalities of gait and mobility (R26.89)   Activity Tolerance Patient tolerated treatment well   Patient Left in chair;with call bell/phone within reach;with family/visitor present   Nurse Communication          Time: 9145-9079 OT Time Calculation (min): 26 min  Charges: OT General Charges $OT Visit: 1 Visit OT Treatments $Self Care/Home Management : 23-37 mins  Mliss NOVAK, OTR/L Acute Rehab Services Office: [phone]   Mliss Fish 01/12/2024, 11:11 AM

## 2024-01-14 DIAGNOSIS — I130 Hypertensive heart and chronic kidney disease with heart failure and stage 1 through stage 4 chronic kidney disease, or unspecified chronic kidney disease: Secondary | ICD-10-CM | POA: Diagnosis not present

## 2024-01-14 DIAGNOSIS — E1122 Type 2 diabetes mellitus with diabetic chronic kidney disease: Secondary | ICD-10-CM | POA: Diagnosis not present

## 2024-01-14 DIAGNOSIS — D631 Anemia in chronic kidney disease: Secondary | ICD-10-CM | POA: Diagnosis not present

## 2024-01-14 DIAGNOSIS — I2510 Atherosclerotic heart disease of native coronary artery without angina pectoris: Secondary | ICD-10-CM | POA: Diagnosis not present

## 2024-01-14 DIAGNOSIS — I480 Paroxysmal atrial fibrillation: Secondary | ICD-10-CM | POA: Diagnosis not present

## 2024-01-14 DIAGNOSIS — I5022 Chronic systolic (congestive) heart failure: Secondary | ICD-10-CM | POA: Diagnosis not present

## 2024-01-14 DIAGNOSIS — I472 Ventricular tachycardia, unspecified: Secondary | ICD-10-CM | POA: Diagnosis not present

## 2024-01-14 DIAGNOSIS — Z48812 Encounter for surgical aftercare following surgery on the circulatory system: Secondary | ICD-10-CM | POA: Diagnosis not present

## 2024-01-14 DIAGNOSIS — N1831 Chronic kidney disease, stage 3a: Secondary | ICD-10-CM | POA: Diagnosis not present

## 2024-01-15 ENCOUNTER — Telehealth: Payer: Self-pay

## 2024-01-15 MED FILL — Midazolam HCl Inj 2 MG/2ML (Base Equivalent): INTRAMUSCULAR | Qty: 0.5 | Status: AC

## 2024-01-15 NOTE — Telephone Encounter (Signed)
 Follow-up after same day discharge: Implant date: 01/11/2024 MD: Ole Holts  Device: Medtronic ICD Location: Left Chest   Wound check visit: 01/29/2024 90 day MD follow-up: 04/15/2024  Remote Transmission received:01/12/2024  Dressing/sling removed: Dressing is removed  Confirm OAC restart on: Pt starts Eliquis  on 01/17/2024  Please continue to monitor your cardiac device site for redness, swelling, and drainage. Call the device clinic at [phone] if you experience these symptoms, fever/chills, or have questions about your device.   Remote monitoring is used to monitor your cardiac device from home. This monitoring is scheduled every 91 days by our office. It allows us  to keep an eye on the functioning of your device to ensure it is working properly.

## 2024-01-18 DIAGNOSIS — I480 Paroxysmal atrial fibrillation: Secondary | ICD-10-CM | POA: Diagnosis not present

## 2024-01-18 DIAGNOSIS — Z48812 Encounter for surgical aftercare following surgery on the circulatory system: Secondary | ICD-10-CM | POA: Diagnosis not present

## 2024-01-18 DIAGNOSIS — I5022 Chronic systolic (congestive) heart failure: Secondary | ICD-10-CM | POA: Diagnosis not present

## 2024-01-18 DIAGNOSIS — D631 Anemia in chronic kidney disease: Secondary | ICD-10-CM | POA: Diagnosis not present

## 2024-01-18 DIAGNOSIS — I2510 Atherosclerotic heart disease of native coronary artery without angina pectoris: Secondary | ICD-10-CM | POA: Diagnosis not present

## 2024-01-18 DIAGNOSIS — N1831 Chronic kidney disease, stage 3a: Secondary | ICD-10-CM | POA: Diagnosis not present

## 2024-01-18 DIAGNOSIS — I130 Hypertensive heart and chronic kidney disease with heart failure and stage 1 through stage 4 chronic kidney disease, or unspecified chronic kidney disease: Secondary | ICD-10-CM | POA: Diagnosis not present

## 2024-01-18 DIAGNOSIS — E1122 Type 2 diabetes mellitus with diabetic chronic kidney disease: Secondary | ICD-10-CM | POA: Diagnosis not present

## 2024-01-18 DIAGNOSIS — I472 Ventricular tachycardia, unspecified: Secondary | ICD-10-CM | POA: Diagnosis not present

## 2024-01-20 DIAGNOSIS — I2510 Atherosclerotic heart disease of native coronary artery without angina pectoris: Secondary | ICD-10-CM | POA: Diagnosis not present

## 2024-01-20 DIAGNOSIS — E785 Hyperlipidemia, unspecified: Secondary | ICD-10-CM | POA: Diagnosis not present

## 2024-01-20 DIAGNOSIS — I472 Ventricular tachycardia, unspecified: Secondary | ICD-10-CM | POA: Diagnosis not present

## 2024-01-20 DIAGNOSIS — Z794 Long term (current) use of insulin: Secondary | ICD-10-CM | POA: Diagnosis not present

## 2024-01-20 DIAGNOSIS — N1831 Chronic kidney disease, stage 3a: Secondary | ICD-10-CM | POA: Diagnosis not present

## 2024-01-20 DIAGNOSIS — I469 Cardiac arrest, cause unspecified: Secondary | ICD-10-CM | POA: Diagnosis not present

## 2024-01-20 DIAGNOSIS — E1129 Type 2 diabetes mellitus with other diabetic kidney complication: Secondary | ICD-10-CM | POA: Diagnosis not present

## 2024-01-20 DIAGNOSIS — D649 Anemia, unspecified: Secondary | ICD-10-CM | POA: Diagnosis not present

## 2024-01-20 DIAGNOSIS — F03A0 Unspecified dementia, mild, without behavioral disturbance, psychotic disturbance, mood disturbance, and anxiety: Secondary | ICD-10-CM | POA: Diagnosis not present

## 2024-01-20 DIAGNOSIS — I129 Hypertensive chronic kidney disease with stage 1 through stage 4 chronic kidney disease, or unspecified chronic kidney disease: Secondary | ICD-10-CM | POA: Diagnosis not present

## 2024-01-27 DIAGNOSIS — E1129 Type 2 diabetes mellitus with other diabetic kidney complication: Secondary | ICD-10-CM | POA: Diagnosis not present

## 2024-01-27 DIAGNOSIS — I2510 Atherosclerotic heart disease of native coronary artery without angina pectoris: Secondary | ICD-10-CM | POA: Diagnosis not present

## 2024-01-27 DIAGNOSIS — I129 Hypertensive chronic kidney disease with stage 1 through stage 4 chronic kidney disease, or unspecified chronic kidney disease: Secondary | ICD-10-CM | POA: Diagnosis not present

## 2024-01-27 DIAGNOSIS — N1831 Chronic kidney disease, stage 3a: Secondary | ICD-10-CM | POA: Diagnosis not present

## 2024-01-27 DIAGNOSIS — Z794 Long term (current) use of insulin: Secondary | ICD-10-CM | POA: Diagnosis not present

## 2024-01-28 DIAGNOSIS — K50819 Crohn's disease of both small and large intestine with unspecified complications: Secondary | ICD-10-CM | POA: Diagnosis not present

## 2024-01-29 ENCOUNTER — Encounter: Payer: Self-pay | Admitting: Pulmonary Disease

## 2024-01-29 ENCOUNTER — Ambulatory Visit: Attending: Pulmonary Disease | Admitting: Pulmonary Disease

## 2024-01-29 VITALS — BP 124/71 | HR 72 | Ht 70.0 in | Wt 176.6 lb

## 2024-01-29 DIAGNOSIS — I10 Essential (primary) hypertension: Secondary | ICD-10-CM | POA: Diagnosis not present

## 2024-01-29 DIAGNOSIS — I469 Cardiac arrest, cause unspecified: Secondary | ICD-10-CM

## 2024-01-29 DIAGNOSIS — I255 Ischemic cardiomyopathy: Secondary | ICD-10-CM

## 2024-01-29 DIAGNOSIS — I25119 Atherosclerotic heart disease of native coronary artery with unspecified angina pectoris: Secondary | ICD-10-CM

## 2024-01-29 DIAGNOSIS — Z95810 Presence of automatic (implantable) cardiac defibrillator: Secondary | ICD-10-CM | POA: Diagnosis not present

## 2024-01-29 LAB — CUP PACEART INCLINIC DEVICE CHECK
Date Time Interrogation Session: 20250711165719
Implantable Lead Connection Status: 753985
Implantable Lead Implant Date: 20250623
Implantable Lead Location: 753860
Implantable Pulse Generator Implant Date: 20250623

## 2024-01-29 NOTE — Progress Notes (Signed)
  Electrophysiology Office Note:   Date:  01/29/2024  ID:  Patrick Miranda, DOB July 23, 1945, MRN [id number]  Primary Cardiologist: Jayson Sierras, MD Primary Heart Failure: None Electrophysiologist: OLE ONEIDA HOLTS, MD       History of Present Illness:   Patrick Miranda is a 77 y.o. male with h/o CAD s/p CABG (2021), ICM, AF, HTN, HLD, PE, OSA on CPAP, DM II, brainstem CVA, dementia, VT arrest in setting of ICM (01/06/24) seen today for routine electrophysiology follow-up s/p Defibrillator implant wound check visit.  Since last being seen in our clinic the patient reports doing well. He has not any issues with the wound.  No issues with amiodarone .   He denies chest pain, palpitations, dyspnea, PND, orthopnea, nausea, vomiting, dizziness, syncope, edema, weight gain, or early satiety.    Review of systems complete and found to be negative unless listed in HPI.   EP Information / Studies Reviewed:    EKG is not ordered today. EKG from 01/12/24 reviewed which showed SB 49 bpm      ICD Interrogation-  reviewed in detail today,  See PACEART report.  Device History: Medtronic Single Chamber ICD implanted 01/11/24 for secondary prevention of SCD History of appropriate therapy: No History of AAD therapy: Yes; currently on amiodarone    Risk Assessment/Calculations:    CHA2DS2-VASc Score = 8   This indicates a 10.8% annual risk of stroke. The patient's score is based upon: CHF History: 1 HTN History: 1 Diabetes History: 1 Stroke History: 2 Vascular Disease History: 1 Age Score: 2 Gender Score: 0             Physical Exam:   VS:  BP 124/71   Pulse 72   Ht 5' 10 (1.778 m)   Wt 176 lb 9.6 oz (80.1 kg)   SpO2 95%   BMI 25.34 kg/m    Wt Readings from Last 3 Encounters:  01/29/24 176 lb 9.6 oz (80.1 kg)  01/07/24 175 lb 0.7 oz (79.4 kg)  01/04/24 175 lb 6.4 oz (79.6 kg)     GEN: Well nourished, well developed in no acute distress NECK: No JVD; No carotid  bruits CARDIAC: Regular rate and rhythm, no murmurs, rubs, gallops. Steri strips removed from incision site without difficulty, site well healed, clean/dry, no hematoma RESPIRATORY:  Clear to auscultation without rales, wheezing or rhonchi  ABDOMEN: Soft, non-tender, non-distended EXTREMITIES:  No edema; No deformity   ASSESSMENT AND PLAN:    VT Arrest s/p Medtronic single chamber ICD  ICM / CAD s/p CABG  -euvolemic on exam -Stable on an appropriate medical regimen -Normal ICD function -See Pace Art report -No changes today -no anginal symptoms  -ICD site well healed without hematoma   -continue amiodarone  200 mg daily > consider amio labs & reduction to 100 mg at 90 day visit  Paroxysmal AF  -continue OAC for stroke prophylaxis   Secondary Hypercoagulable State  -continue Eliquis  5mg  BID, dose reviewed and appropriate by age / wt   Disposition:   Follow up with Dr. HOLTS [place] [id number] for 90 d implant check    Signed, Daphne Barrack, NP-C, AGACNP-BC [facility] - Electrophysiology  01/29/2024, 4:59 PM

## 2024-01-29 NOTE — Patient Instructions (Signed)
 Medication Instructions:  Your physician recommends that you continue on your current medications as directed. Please refer to the Current Medication list given to you today.  *If you need a refill on your cardiac medications before your next appointment, please call your pharmacy*  Lab Work: None ordered If you have labs (blood work) drawn today and your tests are completely normal, you will receive your results only by: MyChart Message (if you have MyChart) OR A paper copy in the mail If you have any lab test that is abnormal or we need to change your treatment, we will call you to review the results.  Follow-Up: At [facility], you and your health needs are our priority.  As part of our continuing mission to provide you with exceptional heart care, our providers are all part of one team.  This team includes your primary Cardiologist (physician) and Advanced Practice Providers or APPs (Physician Assistants and Nurse Practitioners) who all work together to provide you with the care you need, when you need it.  Your next appointment:   As scheduled

## 2024-01-30 ENCOUNTER — Ambulatory Visit: Payer: Self-pay | Admitting: Cardiology

## 2024-02-05 DIAGNOSIS — I472 Ventricular tachycardia, unspecified: Secondary | ICD-10-CM | POA: Diagnosis not present

## 2024-02-05 DIAGNOSIS — D631 Anemia in chronic kidney disease: Secondary | ICD-10-CM | POA: Diagnosis not present

## 2024-02-05 DIAGNOSIS — I130 Hypertensive heart and chronic kidney disease with heart failure and stage 1 through stage 4 chronic kidney disease, or unspecified chronic kidney disease: Secondary | ICD-10-CM | POA: Diagnosis not present

## 2024-02-05 DIAGNOSIS — I480 Paroxysmal atrial fibrillation: Secondary | ICD-10-CM | POA: Diagnosis not present

## 2024-02-05 DIAGNOSIS — Z48812 Encounter for surgical aftercare following surgery on the circulatory system: Secondary | ICD-10-CM | POA: Diagnosis not present

## 2024-02-05 DIAGNOSIS — I5022 Chronic systolic (congestive) heart failure: Secondary | ICD-10-CM | POA: Diagnosis not present

## 2024-02-05 DIAGNOSIS — E1122 Type 2 diabetes mellitus with diabetic chronic kidney disease: Secondary | ICD-10-CM | POA: Diagnosis not present

## 2024-02-05 DIAGNOSIS — N1831 Chronic kidney disease, stage 3a: Secondary | ICD-10-CM | POA: Diagnosis not present

## 2024-02-05 DIAGNOSIS — I2510 Atherosclerotic heart disease of native coronary artery without angina pectoris: Secondary | ICD-10-CM | POA: Diagnosis not present

## 2024-02-10 DIAGNOSIS — N1831 Chronic kidney disease, stage 3a: Secondary | ICD-10-CM | POA: Diagnosis not present

## 2024-02-10 DIAGNOSIS — E1122 Type 2 diabetes mellitus with diabetic chronic kidney disease: Secondary | ICD-10-CM | POA: Diagnosis not present

## 2024-02-10 DIAGNOSIS — I472 Ventricular tachycardia, unspecified: Secondary | ICD-10-CM | POA: Diagnosis not present

## 2024-02-10 DIAGNOSIS — I5022 Chronic systolic (congestive) heart failure: Secondary | ICD-10-CM | POA: Diagnosis not present

## 2024-02-10 DIAGNOSIS — Z48812 Encounter for surgical aftercare following surgery on the circulatory system: Secondary | ICD-10-CM | POA: Diagnosis not present

## 2024-02-10 DIAGNOSIS — I2510 Atherosclerotic heart disease of native coronary artery without angina pectoris: Secondary | ICD-10-CM | POA: Diagnosis not present

## 2024-02-10 DIAGNOSIS — I480 Paroxysmal atrial fibrillation: Secondary | ICD-10-CM | POA: Diagnosis not present

## 2024-02-10 DIAGNOSIS — D631 Anemia in chronic kidney disease: Secondary | ICD-10-CM | POA: Diagnosis not present

## 2024-02-10 DIAGNOSIS — I130 Hypertensive heart and chronic kidney disease with heart failure and stage 1 through stage 4 chronic kidney disease, or unspecified chronic kidney disease: Secondary | ICD-10-CM | POA: Diagnosis not present

## 2024-02-12 DIAGNOSIS — E1165 Type 2 diabetes mellitus with hyperglycemia: Secondary | ICD-10-CM | POA: Diagnosis not present

## 2024-02-12 DIAGNOSIS — E1129 Type 2 diabetes mellitus with other diabetic kidney complication: Secondary | ICD-10-CM | POA: Diagnosis not present

## 2024-02-19 NOTE — Procedures (Signed)
Mask fit

## 2024-02-22 DIAGNOSIS — E1122 Type 2 diabetes mellitus with diabetic chronic kidney disease: Secondary | ICD-10-CM | POA: Diagnosis not present

## 2024-02-22 DIAGNOSIS — D485 Neoplasm of uncertain behavior of skin: Secondary | ICD-10-CM | POA: Diagnosis not present

## 2024-02-22 DIAGNOSIS — F03A18 Unspecified dementia, mild, with other behavioral disturbance: Secondary | ICD-10-CM | POA: Diagnosis not present

## 2024-02-22 DIAGNOSIS — L821 Other seborrheic keratosis: Secondary | ICD-10-CM | POA: Diagnosis not present

## 2024-02-22 DIAGNOSIS — L820 Inflamed seborrheic keratosis: Secondary | ICD-10-CM | POA: Diagnosis not present

## 2024-02-22 DIAGNOSIS — L570 Actinic keratosis: Secondary | ICD-10-CM | POA: Diagnosis not present

## 2024-02-22 DIAGNOSIS — J449 Chronic obstructive pulmonary disease, unspecified: Secondary | ICD-10-CM | POA: Diagnosis not present

## 2024-02-22 DIAGNOSIS — L905 Scar conditions and fibrosis of skin: Secondary | ICD-10-CM | POA: Diagnosis not present

## 2024-02-22 DIAGNOSIS — I5022 Chronic systolic (congestive) heart failure: Secondary | ICD-10-CM | POA: Diagnosis not present

## 2024-02-22 DIAGNOSIS — I480 Paroxysmal atrial fibrillation: Secondary | ICD-10-CM | POA: Diagnosis not present

## 2024-02-22 DIAGNOSIS — N1831 Chronic kidney disease, stage 3a: Secondary | ICD-10-CM | POA: Diagnosis not present

## 2024-02-22 DIAGNOSIS — I472 Ventricular tachycardia, unspecified: Secondary | ICD-10-CM | POA: Diagnosis not present

## 2024-02-22 DIAGNOSIS — Z85828 Personal history of other malignant neoplasm of skin: Secondary | ICD-10-CM | POA: Diagnosis not present

## 2024-02-22 DIAGNOSIS — K50819 Crohn's disease of both small and large intestine with unspecified complications: Secondary | ICD-10-CM | POA: Diagnosis not present

## 2024-02-22 DIAGNOSIS — I130 Hypertensive heart and chronic kidney disease with heart failure and stage 1 through stage 4 chronic kidney disease, or unspecified chronic kidney disease: Secondary | ICD-10-CM | POA: Diagnosis not present

## 2024-02-23 DIAGNOSIS — E1122 Type 2 diabetes mellitus with diabetic chronic kidney disease: Secondary | ICD-10-CM | POA: Diagnosis not present

## 2024-02-23 DIAGNOSIS — I130 Hypertensive heart and chronic kidney disease with heart failure and stage 1 through stage 4 chronic kidney disease, or unspecified chronic kidney disease: Secondary | ICD-10-CM | POA: Diagnosis not present

## 2024-02-23 DIAGNOSIS — I2510 Atherosclerotic heart disease of native coronary artery without angina pectoris: Secondary | ICD-10-CM | POA: Diagnosis not present

## 2024-02-23 DIAGNOSIS — I5022 Chronic systolic (congestive) heart failure: Secondary | ICD-10-CM | POA: Diagnosis not present

## 2024-02-23 DIAGNOSIS — Z48812 Encounter for surgical aftercare following surgery on the circulatory system: Secondary | ICD-10-CM | POA: Diagnosis not present

## 2024-02-23 DIAGNOSIS — D631 Anemia in chronic kidney disease: Secondary | ICD-10-CM | POA: Diagnosis not present

## 2024-02-23 DIAGNOSIS — I480 Paroxysmal atrial fibrillation: Secondary | ICD-10-CM | POA: Diagnosis not present

## 2024-02-23 DIAGNOSIS — I472 Ventricular tachycardia, unspecified: Secondary | ICD-10-CM | POA: Diagnosis not present

## 2024-02-23 DIAGNOSIS — N1831 Chronic kidney disease, stage 3a: Secondary | ICD-10-CM | POA: Diagnosis not present

## 2024-02-24 ENCOUNTER — Ambulatory Visit (INDEPENDENT_AMBULATORY_CARE_PROVIDER_SITE_OTHER)

## 2024-02-24 DIAGNOSIS — I472 Ventricular tachycardia, unspecified: Secondary | ICD-10-CM | POA: Diagnosis not present

## 2024-02-25 LAB — CUP PACEART REMOTE DEVICE CHECK
Battery Remaining Longevity: 172 mo
Battery Voltage: 3.18 V
Brady Statistic RA Percent Paced: INVALID
Brady Statistic RV Percent Paced: 0.2 %
Date Time Interrogation Session: 20250806022815
HighPow Impedance: 57 Ohm
Implantable Lead Connection Status: 753985
Implantable Lead Implant Date: 20250623
Implantable Lead Location: 753860
Implantable Pulse Generator Implant Date: 20250623
Lead Channel Impedance Value: 342 Ohm
Lead Channel Impedance Value: 418 Ohm
Lead Channel Pacing Threshold Amplitude: 0.5 V
Lead Channel Pacing Threshold Pulse Width: 0.4 ms
Lead Channel Sensing Intrinsic Amplitude: 11.3 mV
Lead Channel Setting Pacing Amplitude: 3.5 V
Lead Channel Setting Pacing Pulse Width: 0.4 ms
Lead Channel Setting Sensing Sensitivity: 0.3 mV
Zone Setting Status: 755011

## 2024-02-26 ENCOUNTER — Ambulatory Visit: Payer: Self-pay | Admitting: Cardiology

## 2024-02-29 ENCOUNTER — Encounter: Payer: Self-pay | Admitting: Podiatry

## 2024-02-29 ENCOUNTER — Ambulatory Visit: Admitting: Podiatry

## 2024-02-29 DIAGNOSIS — E119 Type 2 diabetes mellitus without complications: Secondary | ICD-10-CM

## 2024-02-29 DIAGNOSIS — D689 Coagulation defect, unspecified: Secondary | ICD-10-CM | POA: Diagnosis not present

## 2024-02-29 DIAGNOSIS — M79676 Pain in unspecified toe(s): Secondary | ICD-10-CM

## 2024-02-29 DIAGNOSIS — B351 Tinea unguium: Secondary | ICD-10-CM | POA: Diagnosis not present

## 2024-02-29 NOTE — Progress Notes (Signed)
This patient returns to my office for at risk foot care.  This patient requires this care by a professional since this patient will be at risk due to having diabetes and CKD.and coagulation defect..  Patient is taking plavix and eliquis causing coagulation defect. This patient is unable to cut nails himself since the patient cannot reach his nails.These nails are painful walking and wearing shoes.  This patient presents for at risk foot care today.  General Appearance  Alert, conversant and in no acute stress.  Vascular  Dorsalis pedis and posterior tibial  pulses are palpable  bilaterally.  Capillary return is within normal limits  bilaterally. Temperature is within normal limits  bilaterally.  Neurologic  Senn-Weinstein monofilament wire test within normal limits  bilaterally. Muscle power within normal limits bilaterally.  Nails Thick disfigured discolored nails with subungual debris  hallux nails  bilaterally. No evidence of bacterial infection or drainage bilaterally.  Asymptomatic left hallux nail.  Orthopedic  No limitations of motion  feet .  No crepitus or effusions noted.  HAV  B/L.  Overlapping second toe left foot.  Skin  normotropic skin with no porokeratosis noted bilaterally.  No signs of infections or ulcers noted.   Asymptomatic pinch callus.  Onychomycosis  Pain in right toes  Pain in left toes  Consent was obtained for treatment procedures.   Mechanical debridement of nails 1-5  bilaterally performed with a nail nipper.  Filed with dremel without incident.    Return office visit 4  months                  Told patient to return for periodic foot care and evaluation due to potential at risk complications.   Gardiner Barefoot DPM

## 2024-03-07 DIAGNOSIS — E1122 Type 2 diabetes mellitus with diabetic chronic kidney disease: Secondary | ICD-10-CM | POA: Diagnosis not present

## 2024-03-07 DIAGNOSIS — I472 Ventricular tachycardia, unspecified: Secondary | ICD-10-CM | POA: Diagnosis not present

## 2024-03-07 DIAGNOSIS — I2510 Atherosclerotic heart disease of native coronary artery without angina pectoris: Secondary | ICD-10-CM | POA: Diagnosis not present

## 2024-03-07 DIAGNOSIS — I480 Paroxysmal atrial fibrillation: Secondary | ICD-10-CM | POA: Diagnosis not present

## 2024-03-07 DIAGNOSIS — Z48812 Encounter for surgical aftercare following surgery on the circulatory system: Secondary | ICD-10-CM | POA: Diagnosis not present

## 2024-03-07 DIAGNOSIS — D631 Anemia in chronic kidney disease: Secondary | ICD-10-CM | POA: Diagnosis not present

## 2024-03-07 DIAGNOSIS — I130 Hypertensive heart and chronic kidney disease with heart failure and stage 1 through stage 4 chronic kidney disease, or unspecified chronic kidney disease: Secondary | ICD-10-CM | POA: Diagnosis not present

## 2024-03-07 DIAGNOSIS — I5022 Chronic systolic (congestive) heart failure: Secondary | ICD-10-CM | POA: Diagnosis not present

## 2024-03-07 DIAGNOSIS — N1831 Chronic kidney disease, stage 3a: Secondary | ICD-10-CM | POA: Diagnosis not present

## 2024-03-24 DIAGNOSIS — K50819 Crohn's disease of both small and large intestine with unspecified complications: Secondary | ICD-10-CM | POA: Diagnosis not present

## 2024-04-02 ENCOUNTER — Other Ambulatory Visit: Payer: Self-pay | Admitting: Cardiology

## 2024-04-06 DIAGNOSIS — D225 Melanocytic nevi of trunk: Secondary | ICD-10-CM | POA: Diagnosis not present

## 2024-04-06 DIAGNOSIS — L821 Other seborrheic keratosis: Secondary | ICD-10-CM | POA: Diagnosis not present

## 2024-04-06 DIAGNOSIS — L820 Inflamed seborrheic keratosis: Secondary | ICD-10-CM | POA: Diagnosis not present

## 2024-04-06 DIAGNOSIS — D692 Other nonthrombocytopenic purpura: Secondary | ICD-10-CM | POA: Diagnosis not present

## 2024-04-06 DIAGNOSIS — L570 Actinic keratosis: Secondary | ICD-10-CM | POA: Diagnosis not present

## 2024-04-13 ENCOUNTER — Other Ambulatory Visit: Payer: Self-pay | Admitting: Cardiology

## 2024-04-13 DIAGNOSIS — H9203 Otalgia, bilateral: Secondary | ICD-10-CM | POA: Diagnosis not present

## 2024-04-13 DIAGNOSIS — H9193 Unspecified hearing loss, bilateral: Secondary | ICD-10-CM | POA: Diagnosis not present

## 2024-04-13 DIAGNOSIS — I129 Hypertensive chronic kidney disease with stage 1 through stage 4 chronic kidney disease, or unspecified chronic kidney disease: Secondary | ICD-10-CM | POA: Diagnosis not present

## 2024-04-13 DIAGNOSIS — H6993 Unspecified Eustachian tube disorder, bilateral: Secondary | ICD-10-CM | POA: Diagnosis not present

## 2024-04-13 DIAGNOSIS — Z794 Long term (current) use of insulin: Secondary | ICD-10-CM | POA: Diagnosis not present

## 2024-04-13 DIAGNOSIS — E1129 Type 2 diabetes mellitus with other diabetic kidney complication: Secondary | ICD-10-CM | POA: Diagnosis not present

## 2024-04-14 NOTE — Progress Notes (Unsigned)
  Electrophysiology Office Follow up Visit Note:    Date:  04/15/2024   ID:  AMBROSIO REUTER, DOB 22-Apr-1946, MRN [id number]  PCP:  Shepard Ade, MD  [facility] Cardiologist:  Jayson Sierras, MD  [facility] Electrophysiologist:  OLE ONEIDA HOLTS, MD    Interval History:     Patrick Miranda is a 78 y.o. male who presents for a follow up visit.   The patient presents for follow-up after ICD implant January 11, 2024 for history of ventricular tachycardia out-of-hospital cardiac arrest. He is doing well today.  He is with family.  He is fatigued.  They are interested in enrolling in cardiac rehab which I think would be a good idea.       Past medical, surgical, social and family history were reviewed.  ROS:   Please see the history of present illness.    All other systems reviewed and are negative.  EKGs/Labs/Other Studies Reviewed:    The following studies were reviewed today:  April 15, 2024 in-clinic device interrogation personally reviewed battery and lead parameter stable.  1 NSVT lasting less than 20 beats.          Physical Exam:    VS:  BP 110/68 (BP Location: Left Arm, Patient Position: Sitting, Cuff Size: Normal)   Pulse 60   Ht 5' 10 (1.778 m)   Wt 176 lb (79.8 kg)   SpO2 96%   BMI 25.25 kg/m     Wt Readings from Last 3 Encounters:  04/15/24 176 lb (79.8 kg)  01/29/24 176 lb 9.6 oz (80.1 kg)  01/07/24 175 lb 0.7 oz (79.4 kg)     GEN: no distress CARD: RRR, No MRG ICD pocket well-healed RESP: No IWOB. CTAB.      ASSESSMENT:    1. Cardiac arrest (HCC)   2. ICD (implantable cardioverter-defibrillator), single, in situ   3. Encounter for long-term (current) use of high-risk medication    PLAN:    In order of problems listed above:  #Out-of-hospital cardiac arrest #Ventricular tachycardia #ICD in situ #High risk med monitoring-amiodarone  The patient is doing well after his ICD implant.  No high-voltage therapies while on  amiodarone . Device functioning appropriately.  Continue remote monitoring. Referred to cardiac rehab at [facility].  Check CMP, TSH and free T4 today  Follow-up 6 months with APP  Signed, OLE HOLTS, MD, [facility], [facility] 04/15/2024 9:05 AM    Electrophysiology [facility]

## 2024-04-15 ENCOUNTER — Other Ambulatory Visit: Payer: Self-pay

## 2024-04-15 ENCOUNTER — Encounter: Payer: Self-pay | Admitting: Cardiology

## 2024-04-15 ENCOUNTER — Other Ambulatory Visit (HOSPITAL_COMMUNITY): Payer: Self-pay

## 2024-04-15 ENCOUNTER — Ambulatory Visit: Attending: Cardiology | Admitting: Cardiology

## 2024-04-15 VITALS — BP 110/68 | HR 60 | Ht 70.0 in | Wt 176.0 lb

## 2024-04-15 DIAGNOSIS — Z79899 Other long term (current) drug therapy: Secondary | ICD-10-CM

## 2024-04-15 DIAGNOSIS — I469 Cardiac arrest, cause unspecified: Secondary | ICD-10-CM

## 2024-04-15 DIAGNOSIS — Z95810 Presence of automatic (implantable) cardiac defibrillator: Secondary | ICD-10-CM

## 2024-04-15 DIAGNOSIS — I5022 Chronic systolic (congestive) heart failure: Secondary | ICD-10-CM

## 2024-04-15 NOTE — Patient Instructions (Addendum)
 Medication Instructions:  Your physician recommends that you continue on your current medications as directed. Please refer to the Current Medication list given to you today.  *If you need a refill on your cardiac medications before your next appointment, please call your pharmacy*  Lab Work: TODAY: CMET, TSH, T4  Follow-Up: At [facility], you and your health needs are our priority.  As part of our continuing mission to provide you with exceptional heart care, our providers are all part of one team.  This team includes your primary Cardiologist (physician) and Advanced Practice Providers or APPs (Physician Assistants and Nurse Practitioners) who all work together to provide you with the care you need, when you need it.  Your next appointment:   6 month  Provider:   You will see one of the following Advanced Practice Providers on your designated Care Team:   Charlies Arthur, PA-C Ozell Jodie Passey, PA-C Suzann Riddle, NP Daphne Barrack, NP Artist Pouch, PA-C     You have been referred to cardiac rehab

## 2024-04-16 LAB — COMPREHENSIVE METABOLIC PANEL WITH GFR
ALT: 21 IU/L (ref 0–44)
AST: 45 IU/L — ABNORMAL HIGH (ref 0–40)
Albumin: 3.9 g/dL (ref 3.8–4.8)
Alkaline Phosphatase: 60 IU/L (ref 47–123)
BUN/Creatinine Ratio: 16 (ref 10–24)
BUN: 25 mg/dL (ref 8–27)
Bilirubin Total: 0.3 mg/dL (ref 0.0–1.2)
CO2: 22 mmol/L (ref 20–29)
Calcium: 9.4 mg/dL (ref 8.6–10.2)
Chloride: 107 mmol/L — ABNORMAL HIGH (ref 96–106)
Creatinine, Ser: 1.54 mg/dL — ABNORMAL HIGH (ref 0.76–1.27)
Globulin, Total: 2.3 g/dL (ref 1.5–4.5)
Glucose: 107 mg/dL — ABNORMAL HIGH (ref 70–99)
Potassium: 4.4 mmol/L (ref 3.5–5.2)
Sodium: 142 mmol/L (ref 134–144)
Total Protein: 6.2 g/dL (ref 6.0–8.5)
eGFR: 46 mL/min/1.73 — ABNORMAL LOW (ref >59)

## 2024-04-16 LAB — T4, FREE: Free T4: 1.48 ng/dL (ref 0.82–1.77)

## 2024-04-16 LAB — TSH: TSH: 2.91 u[IU]/mL (ref 0.450–4.500)

## 2024-04-18 LAB — CUP PACEART INCLINIC DEVICE CHECK
Date Time Interrogation Session: 20250926150522
Implantable Lead Connection Status: 753985
Implantable Lead Implant Date: 20250623
Implantable Lead Location: 753860
Implantable Pulse Generator Implant Date: 20250623

## 2024-04-18 NOTE — Progress Notes (Signed)
 Remote ICD Transmission

## 2024-04-20 ENCOUNTER — Other Ambulatory Visit: Payer: Self-pay | Admitting: Cardiology

## 2024-04-20 ENCOUNTER — Encounter (HOSPITAL_COMMUNITY): Payer: Self-pay

## 2024-04-21 ENCOUNTER — Ambulatory Visit: Payer: Self-pay | Admitting: Cardiology

## 2024-04-21 MED ORDER — ROSUVASTATIN CALCIUM 20 MG PO TABS: ORAL_TABLET | ORAL | 1 refills | Status: AC

## 2024-04-22 ENCOUNTER — Encounter (HOSPITAL_COMMUNITY)
Admission: RE | Admit: 2024-04-22 | Discharge: 2024-04-22 | Disposition: A | Discharge status: Home / Self Care | Source: Ambulatory Visit | Attending: Cardiology | Admitting: Cardiology

## 2024-04-22 DIAGNOSIS — I5022 Chronic systolic (congestive) heart failure: Secondary | ICD-10-CM | POA: Insufficient documentation

## 2024-04-22 NOTE — Progress Notes (Signed)
 Virtual orientation visit completed for pulmonary rehab with chronic systolic HF. On-site orientation visit scheduled for 04/25/24 at 0800.

## 2024-04-25 ENCOUNTER — Encounter (HOSPITAL_COMMUNITY)
Admission: RE | Admit: 2024-04-25 | Discharge: 2024-04-25 | Disposition: A | Discharge status: Home / Self Care | Source: Ambulatory Visit | Attending: Cardiology | Admitting: Cardiology

## 2024-04-25 VITALS — Ht 68.0 in | Wt 179.9 lb

## 2024-04-25 DIAGNOSIS — I5022 Chronic systolic (congestive) heart failure: Secondary | ICD-10-CM

## 2024-04-25 NOTE — Patient Instructions (Signed)
 Patient Instructions  Patient Details  Name: Patrick Miranda MRN: [id number] Date of Birth: 1945-08-20 Referring Provider:  Cindie Ole DASEN, MD  Below are your personal goals for exercise, nutrition, and risk factors. Our goal is to help you stay on track towards obtaining and maintaining these goals. We will be discussing your progress on these goals with you throughout the program.  Initial Exercise Prescription:  Initial Exercise Prescription - 04/25/24 0900       Date of Initial Exercise RX and Referring Provider   Date 04/25/24    Referring Provider Cindie Ole MD      Oxygen    Maintain Oxygen  Saturation 88% or higher      NuStep   Level 1    SPM 80    Minutes 15    METs 1.7      REL-XR   Level 1    Speed 50    Minutes 15    METs 1.7      Prescription Details   Frequency (times per week) 2    Duration Progress to 30 minutes of continuous aerobic without signs/symptoms of physical distress      Intensity   THRR 40-80% of Max Heartrate 94-125    Ratings of Perceived Exertion 11-13    Perceived Dyspnea 0-4      Progression   Progression Continue to progress workloads to maintain intensity without signs/symptoms of physical distress.      Resistance Training   Training Prescription Yes    Weight 3 lb    Reps 10-15          Exercise Goals: Frequency: Be able to perform aerobic exercise two to three times per week in program working toward 2-5 days per week of home exercise.  Intensity: Work with a perceived exertion of 11 (fairly light) - 15 (hard) while following your exercise prescription.  We will make changes to your prescription with you as you progress through the program.   Duration: Be able to do 30 to 45 minutes of continuous aerobic exercise in addition to a 5 minute warm-up and a 5 minute cool-down routine.   Nutrition Goals: Your personal nutrition goals will be established when you do your nutrition analysis with the  dietician.  The following are general nutrition guidelines to follow: Cholesterol < 200mg /day Sodium < 1500mg /day Fiber: Men over 50 yrs - 30 grams per day  Personal Goals:  Personal Goals and Risk Factors at Admission - 04/25/24 0955       Core Components/Risk Factors/Patient Goals on Admission    Weight Management Yes;Weight Maintenance    Intervention Weight Management: Develop a combined nutrition and exercise program designed to reach desired caloric intake, while maintaining appropriate intake of nutrient and fiber, sodium and fats, and appropriate energy expenditure required for the weight goal.;Weight Management: Provide education and appropriate resources to help participant work on and attain dietary goals.    Admit Weight 179 lb 14.4 oz (81.6 kg)    Goal Weight: Short Term 174 lb (78.9 kg)    Goal Weight: Long Term 170 lb (77.1 kg)    Expected Outcomes Short Term: Continue to assess and modify interventions until short term weight is achieved;Long Term: Adherence to nutrition and physical activity/exercise program aimed toward attainment of established weight goal;Weight Maintenance: Understanding of the daily nutrition guidelines, which includes 25-35% calories from fat, 7% or less cal from saturated fats, less than 200mg  cholesterol, less than 1.5gm of sodium, & 5 or more servings of  fruits and vegetables daily;Weight Loss: Understanding of general recommendations for a balanced deficit meal plan, which promotes 1-2 lb weight loss per week and includes a negative energy balance of [phone] kcal/d;Understanding recommendations for meals to include 15-35% energy as protein, 25-35% energy from fat, 35-60% energy from carbohydrates, less than 200mg  of dietary cholesterol, 20-35 gm of total fiber daily;Understanding of distribution of calorie intake throughout the day with the consumption of 4-5 meals/snacks    Improve shortness of breath with ADL's Yes    Intervention Provide education,  individualized exercise plan and daily activity instruction to help decrease symptoms of SOB with activities of daily living.    Expected Outcomes Short Term: Improve cardiorespiratory fitness to achieve a reduction of symptoms when performing ADLs;Long Term: Be able to perform more ADLs without symptoms or delay the onset of symptoms    Diabetes Yes    Intervention Provide education about signs/symptoms and action to take for hypo/hyperglycemia.;Provide education about proper nutrition, including hydration, and aerobic/resistive exercise prescription along with prescribed medications to achieve blood glucose in normal ranges: Fasting glucose 65-99 mg/dL    Expected Outcomes Short Term: Participant verbalizes understanding of the signs/symptoms and immediate care of hyper/hypoglycemia, proper foot care and importance of medication, aerobic/resistive exercise and nutrition plan for blood glucose control.;Long Term: Attainment of HbA1C < 7%.    Heart Failure Yes    Intervention Provide a combined exercise and nutrition program that is supplemented with education, support and counseling about heart failure. Directed toward relieving symptoms such as shortness of breath, decreased exercise tolerance, and extremity edema.    Expected Outcomes Improve functional capacity of life;Short term: Attendance in program 2-3 days a week with increased exercise capacity. Reported lower sodium intake. Reported increased fruit and vegetable intake. Reports medication compliance.;Short term: Daily weights obtained and reported for increase. Utilizing diuretic protocols set by physician.;Long term: Adoption of self-care skills and reduction of barriers for early signs and symptoms recognition and intervention leading to self-care maintenance.    Hypertension Yes    Intervention Provide education on lifestyle modifcations including regular physical activity/exercise, weight management, moderate sodium restriction and increased  consumption of fresh fruit, vegetables, and low fat dairy, alcohol  moderation, and smoking cessation.;Monitor prescription use compliance.    Expected Outcomes Short Term: Continued assessment and intervention until BP is < 140/40mm HG in hypertensive participants. < 130/58mm HG in hypertensive participants with diabetes, heart failure or chronic kidney disease.;Long Term: Maintenance of blood pressure at goal levels.    Lipids Yes    Intervention Provide education and support for participant on nutrition & aerobic/resistive exercise along with prescribed medications to achieve LDL 70mg , HDL >40mg .    Expected Outcomes Short Term: Participant states understanding of desired cholesterol values and is compliant with medications prescribed. Participant is following exercise prescription and nutrition guidelines.;Long Term: Cholesterol controlled with medications as prescribed, with individualized exercise RX and with personalized nutrition plan. Value goals: LDL < 70mg , HDL > 40 mg.          Tobacco Use Initial Evaluation: Social History   Tobacco Use  Smoking Status Never  Smokeless Tobacco Never    Exercise Goals and Review:  Exercise Goals     Row Name 04/25/24 0952             Exercise Goals   Increase Physical Activity Yes       Intervention Provide advice, education, support and counseling about physical activity/exercise needs.;Develop an individualized exercise prescription for aerobic and resistive training  based on initial evaluation findings, risk stratification, comorbidities and participant's personal goals.       Expected Outcomes Short Term: Attend rehab on a regular basis to increase amount of physical activity.;Long Term: Add in home exercise to make exercise part of routine and to increase amount of physical activity.;Long Term: Exercising regularly at least 3-5 days a week.       Increase Strength and Stamina Yes       Intervention Provide advice, education, support  and counseling about physical activity/exercise needs.;Develop an individualized exercise prescription for aerobic and resistive training based on initial evaluation findings, risk stratification, comorbidities and participant's personal goals.       Expected Outcomes Short Term: Increase workloads from initial exercise prescription for resistance, speed, and METs.;Short Term: Perform resistance training exercises routinely during rehab and add in resistance training at home;Long Term: Improve cardiorespiratory fitness, muscular endurance and strength as measured by increased METs and functional capacity ( )       Able to understand and use rate of perceived exertion (RPE) scale Yes       Intervention Provide education and explanation on how to use RPE scale       Expected Outcomes Short Term: Able to use RPE daily in rehab to express subjective intensity level;Long Term:  Able to use RPE to guide intensity level when exercising independently       Able to understand and use Dyspnea scale Yes       Intervention Provide education and explanation on how to use Dyspnea scale       Expected Outcomes Short Term: Able to use Dyspnea scale daily in rehab to express subjective sense of shortness of breath during exertion;Long Term: Able to use Dyspnea scale to guide intensity level when exercising independently       Knowledge and understanding of Target Heart Rate Range (THRR) Yes       Intervention Provide education and explanation of THRR including how the numbers were predicted and where they are located for reference       Expected Outcomes Short Term: Able to state/look up THRR;Short Term: Able to use daily as guideline for intensity in rehab;Long Term: Able to use THRR to govern intensity when exercising independently       Able to check pulse independently Yes       Intervention Provide education and demonstration on how to check pulse in carotid and radial arteries.;Review the importance of being  able to check your own pulse for safety during independent exercise       Expected Outcomes Short Term: Able to explain why pulse checking is important during independent exercise;Long Term: Able to check pulse independently and accurately       Understanding of Exercise Prescription Yes       Intervention Provide education, explanation, and written materials on patient's individual exercise prescription       Expected Outcomes Short Term: Able to explain program exercise prescription;Long Term: Able to explain home exercise prescription to exercise independently          Copy of goals given to participant.

## 2024-04-25 NOTE — Progress Notes (Signed)
 Pulmonary Individual Treatment Plan  Patient Details  Name: Patrick Miranda MRN: [id number] Date of Birth: 25-Jun-1946 Referring Provider:   Flowsheet Row PULMONARY REHAB OTHER RESP ORIENTATION from 04/25/2024 in [facility]  Referring Provider Cindie Smalls MD    Initial Encounter Date:  Flowsheet Row PULMONARY REHAB OTHER RESP ORIENTATION from 04/25/2024 in [place] [facility]  Date 04/25/24    Visit Diagnosis: Chronic systolic HF (heart failure) (HCC)  Patient's Home Medications on Admission:   Current Outpatient Medications:    amiodarone  (PACERONE ) 200 MG tablet, 200 mg BID x 7 days, then 200 mg daily, Disp: 40 tablet, Rfl: 3   apixaban  (ELIQUIS ) 5 MG TABS tablet, TAKE 1 TABLET(5 MG) BY MOUTH TWICE DAILY, Disp: 180 tablet, Rfl: 1   BD PEN NEEDLE NANO U/F 32G X 4 MM MISC, 1 each by Other route daily. , Disp: , Rfl:    Biotin -Vitamin C (HAIR SKIN NAILS GUMMIES PO), Take 2 tablets by mouth daily., Disp: , Rfl:    Continuous Glucose Sensor (FREESTYLE LIBRE 3 PLUS SENSOR) MISC, by Does not apply route. Change sensor every 15 days., Disp: , Rfl:    Cyanocobalamin  (VITAMIN B-12) 5000 MCG SUBL, Place 1 tablet under the tongue daily., Disp: , Rfl:    donepezil  (ARICEPT ) 10 MG tablet, Take 1 tablet (10 mg total) by mouth at bedtime., Disp: 90 tablet, Rfl: 3   empagliflozin  ([name] ) 10 MG TABS tablet, Take 1 tablet (10 mg total) by mouth daily before breakfast., Disp: 90 tablet, Rfl: 1   fenofibrate  160 MG tablet, Take 160 mg by mouth every morning., Disp: , Rfl:    Ferrous Sulfate  (IRON PO), Take 1 tablet by mouth daily., Disp: , Rfl:    Insulin  Aspart (NOVOLOG  FLEXPEN [place]), Inject 8-10 Units into the skin 3 (three) times daily before meals. Dose per sliding scale, Disp: , Rfl:    insulin  degludec (TRESIBA FLEXTOUCH) 100 UNIT/ML FlexTouch Pen, Inject 32 Units into the skin daily., Disp: , Rfl:    levothyroxine  (SYNTHROID ) 50 MCG tablet, Take 50 mcg  by mouth every morning., Disp: , Rfl:    losartan  (COZAAR ) 25 MG tablet, TAKE 1/2 TABLET(12.5 MG) BY MOUTH DAILY, Disp: 45 tablet, Rfl: 1   memantine  (NAMENDA ) 10 MG tablet, Take 1 tablet (10 mg total) by mouth 2 (two) times daily., Disp: 180 tablet, Rfl: 3   metFORMIN  (GLUCOPHAGE ) 500 MG tablet, Take 500 mg by mouth 2 (two) times daily with a meal., Disp: , Rfl:    Multiple Vitamin (MULTIVITAMIN WITH MINERALS) TABS tablet, Take 1 tablet by mouth daily., Disp: , Rfl:    rosuvastatin  (CRESTOR ) 20 MG tablet, TAKE 1 TABLET(20 MG) BY MOUTH DAILY, Disp: 90 tablet, Rfl: 1   sertraline  (ZOLOFT ) 25 MG tablet, Take 1 tablet (25 mg total) by mouth daily., Disp: 90 tablet, Rfl: 3   tamsulosin  (FLOMAX ) 0.4 MG CAPS capsule, Take 0.4 mg by mouth at bedtime., Disp: , Rfl:    Vedolizumab  (ENTYVIO  IV), Inject into the vein every 28 (twenty-eight) days., Disp: , Rfl:   Past Medical History: Past Medical History:  Diagnosis Date   Arthritis    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 s/p CABG in 06/2020 with LIMA-LAD, SVG-D1 and SVG-RCA   COVID-07 Dec 2019   Dementia [facility])    Depression    Essential hypertension    Hernia, umbilical    HOH (  hard of hearing)    Hyperlipidemia    Ischemic cardiomyopathy    Ischemic necrosis of small bowel    Left ventricular mural thrombus    NSTEMI (non-ST elevated myocardial infarction) (HCC)    OSA on CPAP    Pulmonary emboli [facility])    May 2021   Skin cancer    Type 2 diabetes mellitus (HCC)     Tobacco Use: Social History   Tobacco Use  Smoking Status Never  Smokeless Tobacco Never    Labs: Review Flowsheet  More data exists      Latest Ref Rng & Units 07/14/2020 09/09/2020 05/08/2021 06/17/2023 01/06/2024  Labs for ITP Cardiac and Pulmonary Rehab  Cholestrol 0 - 200 mg/dL - 65  - - -  LDL (calc) 0 - 99 mg/dL - 25  - - -  HDL-C >59 mg/dL - 21  - - -  Trlycerides <150 mg/dL - 94  - - -   Hemoglobin A1c 4.8 - 5.6 % - 6.8  8.8  8.1  7.8   Bicarbonate 20.0 - 28.0 mmol/L - - - - 21.9   TCO2 22 - 32 mmol/L - - - - 23   Acid-base deficit 0.0 - 2.0 mmol/L - - - - 4.0   O2 Saturation % 55.5  - - - 74     Capillary Blood Glucose: Lab Results  Component Value Date   GLUCAP 205 (H) 01/12/2024   GLUCAP 235 (H) 01/12/2024   GLUCAP 166 (H) 01/11/2024   GLUCAP 122 (H) 01/11/2024   GLUCAP 127 (H) 01/11/2024     Pulmonary Assessment Scores:  Pulmonary Assessment Scores     Row Name 04/25/24 0852         ADL UCSD   ADL Phase Entry     SOB Score total 21     Rest 0     Walk 0     Stairs 3     Bath 0     Dress 0     Shop 3       CAT Score   CAT Score 14       mMRC Score   mMRC Score 3       UCSD: Self-administered rating of dyspnea associated with activities of daily living (ADLs) 6-point scale (0 = not at all to 5 = maximal or unable to do because of breathlessness)  Scoring Scores range from 0 to 120.  Minimally important difference is 5 units  CAT: CAT can identify the health impairment of COPD patients and is better correlated with disease progression.  CAT has a scoring range of zero to 40. The CAT score is classified into four groups of low (less than 10), medium (10 - 20), high (21-30) and very high (31-40) based on the impact level of disease on health status. A CAT score over 10 suggests significant symptoms.  A worsening CAT score could be explained by an exacerbation, poor medication adherence, poor inhaler technique, or progression of COPD or comorbid conditions.  CAT MCID is 2 points  mMRC: mMRC (Modified Medical Research Council) Dyspnea Scale is used to assess the degree of baseline functional disability in patients of respiratory disease due to dyspnea. No minimal important difference is established. A decrease in score of 1 point or greater is considered a positive change.   Pulmonary Function Assessment:   Exercise Target  Goals: Exercise Program Goal: Individual exercise prescription set using results from initial 6 min walk test and THRR while  considering  patient's activity barriers and safety.   Exercise Prescription Goal: Initial exercise prescription builds to 30-45 minutes a day of aerobic activity, 2-3 days per week.  Home exercise guidelines will be given to patient during program as part of exercise prescription that the participant will acknowledge.  Activity Barriers & Risk Stratification:  Activity Barriers & Cardiac Risk Stratification - 04/25/24 0950       Activity Barriers & Cardiac Risk Stratification   Activity Barriers Assistive Device;Deconditioning;Balance Concerns;Arthritis;Left Knee Replacement;Right Knee Replacement;Shortness of Breath;Decreased Ventricular Function;Muscular Weakness    Cardiac Risk Stratification High          6 Minute Walk:  6 Minute Walk     Row Name 04/25/24 0947         6 Minute Walk   Phase Initial     Distance 895 feet     Walk Time 6 minutes     # of Rest Breaks 0     MPH 1.69     METS 1.74     RPE 13     Perceived Dyspnea  1     VO2 Peak 6.08     Symptoms No     Resting HR 63 bpm     Resting BP 116/60     Resting Oxygen  Saturation  96 %     Exercise Oxygen  Saturation  during 6 min walk 93 %     Max Ex. HR 81 bpm     Max Ex. BP 144/66     2 Minute Post BP 134/70       Interval HR   1 Minute HR 86     2 Minute HR 86     3 Minute HR 90     4 Minute HR 92     5 Minute HR 91     6 Minute HR 90     2 Minute Post HR 69     Interval Heart Rate? Yes       Interval Oxygen    Interval Oxygen ? Yes     Baseline Oxygen  Saturation % 96 %     1 Minute Oxygen  Saturation % 95 %     1 Minute Liters of Oxygen  0 L  Room Air     2 Minute Oxygen  Saturation % 93 %     2 Minute Liters of Oxygen  0 L     3 Minute Oxygen  Saturation % 94 %     3 Minute Liters of Oxygen  0 L     4 Minute Oxygen  Saturation % 94 %     4 Minute Liters of Oxygen  0 L     5  Minute Oxygen  Saturation % 95 %     5 Minute Liters of Oxygen  0 L     6 Minute Oxygen  Saturation % 95 %     6 Minute Liters of Oxygen  0 L     2 Minute Post Oxygen  Saturation % 96 %     2 Minute Post Liters of Oxygen  0 L        Oxygen  Initial Assessment:  Oxygen  Initial Assessment - 04/22/24 1428       Home Oxygen    Home Oxygen  Device None    Sleep Oxygen  Prescription CPAP;None      Intervention   Short Term Goals To learn and understand importance of monitoring SPO2 with pulse oximeter and demonstrate accurate use of the pulse oximeter.;To learn and understand importance of maintaining oxygen  saturations>88%;To learn and demonstrate  proper pursed lip breathing techniques or other breathing techniques. ;To learn and demonstrate proper use of respiratory medications    Long  Term Goals Verbalizes importance of monitoring SPO2 with pulse oximeter and return demonstration;Maintenance of O2 saturations>88%;Exhibits proper breathing techniques, such as pursed lip breathing or other method taught during program session;Compliance with respiratory medication;Demonstrates proper use of MDI's          Oxygen  Re-Evaluation:   Oxygen  Discharge (Final Oxygen  Re-Evaluation):   Initial Exercise Prescription:  Initial Exercise Prescription - 04/25/24 0900       Date of Initial Exercise RX and Referring Provider   Date 04/25/24    Referring Provider Cindie Smalls MD      Oxygen    Maintain Oxygen  Saturation 88% or higher      NuStep   Level 1    SPM 80    Minutes 15    METs 1.7      REL-XR   Level 1    Speed 50    Minutes 15    METs 1.7      Prescription Details   Frequency (times per week) 2    Duration Progress to 30 minutes of continuous aerobic without signs/symptoms of physical distress      Intensity   THRR 40-80% of Max Heartrate 94-125    Ratings of Perceived Exertion 11-13    Perceived Dyspnea 0-4      Progression   Progression Continue to progress workloads  to maintain intensity without signs/symptoms of physical distress.      Resistance Training   Training Prescription Yes    Weight 3 lb    Reps 10-15          Perform Capillary Blood Glucose checks as needed.  Exercise Prescription Changes:   Exercise Prescription Changes     Row Name 04/25/24 0900             Response to Exercise   Blood Pressure (Admit) 116/60       Blood Pressure (Exercise) 144/66       Blood Pressure (Exit) 132/60       Heart Rate (Admit) 63 bpm       Heart Rate (Exercise) 92 bpm       Heart Rate (Exit) 62 bpm       Oxygen  Saturation (Admit) 96 %       Oxygen  Saturation (Exercise) 93 %       Oxygen  Saturation (Exit) 97 %       Rating of Perceived Exertion (Exercise) 13       Perceived Dyspnea (Exercise) 1       Symptoms none       Comments walk test results          Exercise Comments:   Exercise Goals and Review:   Exercise Goals     Row Name 04/25/24 0952             Exercise Goals   Increase Physical Activity Yes       Intervention Provide advice, education, support and counseling about physical activity/exercise needs.;Develop an individualized exercise prescription for aerobic and resistive training based on initial evaluation findings, risk stratification, comorbidities and participant's personal goals.       Expected Outcomes Short Term: Attend rehab on a regular basis to increase amount of physical activity.;Long Term: Add in home exercise to make exercise part of routine and to increase amount of physical activity.;Long Term: Exercising regularly at least 3-5 days a week.  Increase Strength and Stamina Yes       Intervention Provide advice, education, support and counseling about physical activity/exercise needs.;Develop an individualized exercise prescription for aerobic and resistive training based on initial evaluation findings, risk stratification, comorbidities and participant's personal goals.       Expected Outcomes  Short Term: Increase workloads from initial exercise prescription for resistance, speed, and METs.;Short Term: Perform resistance training exercises routinely during rehab and add in resistance training at home;Long Term: Improve cardiorespiratory fitness, muscular endurance and strength as measured by increased METs and functional capacity ( )       Able to understand and use rate of perceived exertion (RPE) scale Yes       Intervention Provide education and explanation on how to use RPE scale       Expected Outcomes Short Term: Able to use RPE daily in rehab to express subjective intensity level;Long Term:  Able to use RPE to guide intensity level when exercising independently       Able to understand and use Dyspnea scale Yes       Intervention Provide education and explanation on how to use Dyspnea scale       Expected Outcomes Short Term: Able to use Dyspnea scale daily in rehab to express subjective sense of shortness of breath during exertion;Long Term: Able to use Dyspnea scale to guide intensity level when exercising independently       Knowledge and understanding of Target Heart Rate Range (THRR) Yes       Intervention Provide education and explanation of THRR including how the numbers were predicted and where they are located for reference       Expected Outcomes Short Term: Able to state/look up THRR;Short Term: Able to use daily as guideline for intensity in rehab;Long Term: Able to use THRR to govern intensity when exercising independently       Able to check pulse independently Yes       Intervention Provide education and demonstration on how to check pulse in carotid and radial arteries.;Review the importance of being able to check your own pulse for safety during independent exercise       Expected Outcomes Short Term: Able to explain why pulse checking is important during independent exercise;Long Term: Able to check pulse independently and accurately       Understanding of Exercise  Prescription Yes       Intervention Provide education, explanation, and written materials on patient's individual exercise prescription       Expected Outcomes Short Term: Able to explain program exercise prescription;Long Term: Able to explain home exercise prescription to exercise independently          Exercise Goals Re-Evaluation :   Discharge Exercise Prescription (Final Exercise Prescription Changes):  Exercise Prescription Changes - 04/25/24 0900       Response to Exercise   Blood Pressure (Admit) 116/60    Blood Pressure (Exercise) 144/66    Blood Pressure (Exit) 132/60    Heart Rate (Admit) 63 bpm    Heart Rate (Exercise) 92 bpm    Heart Rate (Exit) 62 bpm    Oxygen  Saturation (Admit) 96 %    Oxygen  Saturation (Exercise) 93 %    Oxygen  Saturation (Exit) 97 %    Rating of Perceived Exertion (Exercise) 13    Perceived Dyspnea (Exercise) 1    Symptoms none    Comments walk test results          Nutrition:  Target Goals: Understanding of  nutrition guidelines, daily intake of sodium 1500mg , cholesterol 200mg , calories 30% from fat and 7% or less from saturated fats, daily to have 5 or more servings of fruits and vegetables.  Biometrics:  Pre Biometrics - 04/25/24 0954       Pre Biometrics   Height 5' 8 (1.727 m)    Weight 81.6 kg    Waist Circumference 36 inches    Hip Circumference 39 inches    Waist to Hip Ratio 0.92 %    BMI (Calculated) 27.36    Grip Strength 23.2 kg    Single Leg Stand 2.1 seconds           Nutrition Therapy Plan and Nutrition Goals:   Nutrition Assessments:  MEDIFICTS Score Key: >=70 Need to make dietary changes  40-70 Heart Healthy Diet <= 40 Therapeutic Level Cholesterol Diet  Flowsheet Row PULMONARY VIRTUAL BASED CARE from 04/22/2024 in [facility] CARDIAC REHABILITATION  Picture Your Plate Total Score on Admission 50   Picture Your Plate Scores: <59 Unhealthy dietary pattern with much room for improvement. 41-50  Dietary pattern unlikely to meet recommendations for good health and room for improvement. 51-60 More healthful dietary pattern, with some room for improvement.  >60 Healthy dietary pattern, although there may be some specific behaviors that could be improved.    Nutrition Goals Re-Evaluation:   Nutrition Goals Discharge (Final Nutrition Goals Re-Evaluation):   Psychosocial: Target Goals: Acknowledge presence or absence of significant depression and/or stress, maximize coping skills, provide positive support system. Participant is able to verbalize types and ability to use techniques and skills needed for reducing stress and depression.  Initial Review & Psychosocial Screening:  Initial Psych Review & Screening - 04/22/24 1432       Initial Review   Current issues with Current Psychotropic Meds;Current Sleep Concerns;Current Depression      Family Dynamics   Good Support System? Yes    Comments Patient's wife and daughter support him.      Barriers   Psychosocial barriers to participate in program The patient should benefit from training in stress management and relaxation.;There are no identifiable barriers or psychosocial needs.      Screening Interventions   Interventions Encouraged to exercise;To provide support and resources with identified psychosocial needs;Provide feedback about the scores to participant    Expected Outcomes Short Term goal: Utilizing psychosocial counselor, staff and physician to assist with identification of specific Stressors or current issues interfering with healing process. Setting desired goal for each stressor or current issue identified.;Long Term Goal: Stressors or current issues are controlled or eliminated.;Short Term goal: Identification and review with participant of any Quality of Life or Depression concerns found by scoring the questionnaire.;Long Term goal: The participant improves quality of Life and PHQ9 Scores as seen by post scores and/or  verbalization of changes          Quality of Life Scores:  Scores of 19 and below usually indicate a poorer quality of life in these areas.  A difference of  2-3 points is a clinically meaningful difference.  A difference of 2-3 points in the total score of the Quality of Life Index has been associated with significant improvement in overall quality of life, self-image, physical symptoms, and general health in studies assessing change in quality of life.  PHQ-9: Review Flowsheet       04/25/2024 01/19/2020 12/20/2019  Depression screen PHQ 2/9  Decreased Interest 3 0 0  Down, Depressed, Hopeless 2 0 1  PHQ -  2 Score 5 0 1  Altered sleeping 3 - -  Tired, decreased energy 3 - -  Change in appetite 3 - -  Feeling bad or failure about yourself  0 - -  Trouble concentrating 3 - -  Moving slowly or fidgety/restless 0 - -  Suicidal thoughts 0 - -  PHQ-9 Score 17 - -  Difficult doing work/chores Very difficult - -   Interpretation of Total Score  Total Score Depression Severity:  1-4 = Minimal depression, 5-9 = Mild depression, 10-14 = Moderate depression, 15-19 = Moderately severe depression, 20-27 = Severe depression   Psychosocial Evaluation and Intervention:  Psychosocial Evaluation - 04/22/24 1432       Psychosocial Evaluation & Interventions   Interventions Relaxation education;Encouraged to exercise with the program and follow exercise prescription;Stress management education    Comments Patient was referred to pulmonary rehab with chronic systolic HF. His wife and daughter answered questions for virtual call. He has mild dementia and is being treated with Aricept . He also has some depression treated with Zoloft . He and his wife live with their daughter. His wife says he sleeps 90% of the time. She says he is able to follow directions but may need repeating at times. Her goals for him is to get him moving; to increase his activity level and that he will sleep less. There are no  barriers identified for him to complete the program.    Expected Outcomes Short Term: Patient will start the program and attend consistently. Long Term: Patient will complete the program meeting personal goals.    Continue Psychosocial Services  Follow up required by staff          Psychosocial Re-Evaluation:   Psychosocial Discharge (Final Psychosocial Re-Evaluation):   Education: Education Goals: Education classes will be provided on a weekly basis, covering required topics. Participant will state understanding/return demonstration of topics presented.  Learning Barriers/Preferences:  Learning Barriers/Preferences - 04/22/24 1428       Learning Barriers/Preferences   Learning Barriers Hearing   Patient has dementia.   Learning Preferences Skilled Demonstration          Education Topics: Know Your Numbers Group instruction that is supported by a PowerPoint presentation. Instructor discusses importance of knowing and understanding resting, exercise, and post-exercise oxygen  saturation, heart rate, and blood pressure. Oxygen  saturation, heart rate, blood pressure, rating of perceived exertion, and dyspnea are reviewed along with a normal range for these values.    Exercise for the Pulmonary Patient Group instruction that is supported by a PowerPoint presentation. Instructor discusses benefits of exercise, core components of exercise, frequency, duration, and intensity of an exercise routine, importance of utilizing pulse oximetry during exercise, safety while exercising, and options of places to exercise outside of rehab.    MET Level  Group instruction provided by PowerPoint, verbal discussion, and written material to support subject matter. Instructor reviews what METs are and how to increase METs.    Pulmonary Medications Verbally interactive group education provided by instructor with focus on inhaled medications and proper administration.   Anatomy and Physiology of  the Respiratory System Group instruction provided by PowerPoint, verbal discussion, and written material to support subject matter. Instructor reviews respiratory cycle and anatomical components of the respiratory system and their functions. Instructor also reviews differences in obstructive and restrictive respiratory diseases with examples of each.    Oxygen  Safety Group instruction provided by PowerPoint, verbal discussion, and written material to support subject matter. There is an overview of "  What is Oxygen " and "Why do we need it".  Instructor also reviews how to create a safe environment for oxygen  use, the importance of using oxygen  as prescribed, and the risks of noncompliance. There is a brief discussion on traveling with oxygen  and resources the patient may utilize.   Oxygen  Use Group instruction provided by PowerPoint, verbal discussion, and written material to discuss how supplemental oxygen  is prescribed and different types of oxygen  supply systems. Resources for more information are provided.    Breathing Techniques Group instruction that is supported by demonstration and informational handouts. Instructor discusses the benefits of pursed lip and diaphragmatic breathing and detailed demonstration on how to perform both.     Risk Factor Reduction Group instruction that is supported by a PowerPoint presentation. Instructor discusses the definition of a risk factor, different risk factors for pulmonary disease, and how the heart and lungs work together.   Pulmonary Diseases Group instruction provided by PowerPoint, verbal discussion, and written material to support subject matter. Instructor gives an overview of the different type of pulmonary diseases. There is also a discussion on risk factors and symptoms as well as ways to manage the diseases.   Stress and Energy Conservation Group instruction provided by PowerPoint, verbal discussion, and written material to support subject  matter. Instructor gives an overview of stress and the impact it can have on the body. Instructor also reviews ways to reduce stress. There is also a discussion on energy conservation and ways to conserve energy throughout the day.   Warning Signs and Symptoms Group instruction provided by PowerPoint, verbal discussion, and written material to support subject matter. Instructor reviews warning signs and symptoms of stroke, heart attack, cold and flu. Instructor also reviews ways to prevent the spread of infection.   Other Education Group or individual verbal, written, or video instructions that support the educational goals of the pulmonary rehab program.    Knowledge Questionnaire Score:  Knowledge Questionnaire Score - 04/22/24 1430       Knowledge Questionnaire Score   Pre Score 13/18          Core Components/Risk Factors/Patient Goals at Admission:  Personal Goals and Risk Factors at Admission - 04/25/24 0955       Core Components/Risk Factors/Patient Goals on Admission    Weight Management Yes;Weight Maintenance    Intervention Weight Management: Develop a combined nutrition and exercise program designed to reach desired caloric intake, while maintaining appropriate intake of nutrient and fiber, sodium and fats, and appropriate energy expenditure required for the weight goal.;Weight Management: Provide education and appropriate resources to help participant work on and attain dietary goals.    Admit Weight 179 lb 14.4 oz (81.6 kg)    Goal Weight: Short Term 174 lb (78.9 kg)    Goal Weight: Long Term 170 lb (77.1 kg)    Expected Outcomes Short Term: Continue to assess and modify interventions until short term weight is achieved;Long Term: Adherence to nutrition and physical activity/exercise program aimed toward attainment of established weight goal;Weight Maintenance: Understanding of the daily nutrition guidelines, which includes 25-35% calories from fat, 7% or less cal from  saturated fats, less than 200mg  cholesterol, less than 1.5gm of sodium, & 5 or more servings of fruits and vegetables daily;Weight Loss: Understanding of general recommendations for a balanced deficit meal plan, which promotes 1-2 lb weight loss per week and includes a negative energy balance of [phone] kcal/d;Understanding recommendations for meals to include 15-35% energy as protein, 25-35% energy from fat, 35-60%  energy from carbohydrates, less than 200mg  of dietary cholesterol, 20-35 gm of total fiber daily;Understanding of distribution of calorie intake throughout the day with the consumption of 4-5 meals/snacks    Improve shortness of breath with ADL's Yes    Intervention Provide education, individualized exercise plan and daily activity instruction to help decrease symptoms of SOB with activities of daily living.    Expected Outcomes Short Term: Improve cardiorespiratory fitness to achieve a reduction of symptoms when performing ADLs;Long Term: Be able to perform more ADLs without symptoms or delay the onset of symptoms    Diabetes Yes    Intervention Provide education about signs/symptoms and action to take for hypo/hyperglycemia.;Provide education about proper nutrition, including hydration, and aerobic/resistive exercise prescription along with prescribed medications to achieve blood glucose in normal ranges: Fasting glucose 65-99 mg/dL    Expected Outcomes Short Term: Participant verbalizes understanding of the signs/symptoms and immediate care of hyper/hypoglycemia, proper foot care and importance of medication, aerobic/resistive exercise and nutrition plan for blood glucose control.;Long Term: Attainment of HbA1C < 7%.    Heart Failure Yes    Intervention Provide a combined exercise and nutrition program that is supplemented with education, support and counseling about heart failure. Directed toward relieving symptoms such as shortness of breath, decreased exercise tolerance, and extremity  edema.    Expected Outcomes Improve functional capacity of life;Short term: Attendance in program 2-3 days a week with increased exercise capacity. Reported lower sodium intake. Reported increased fruit and vegetable intake. Reports medication compliance.;Short term: Daily weights obtained and reported for increase. Utilizing diuretic protocols set by physician.;Long term: Adoption of self-care skills and reduction of barriers for early signs and symptoms recognition and intervention leading to self-care maintenance.    Hypertension Yes    Intervention Provide education on lifestyle modifcations including regular physical activity/exercise, weight management, moderate sodium restriction and increased consumption of fresh fruit, vegetables, and low fat dairy, alcohol  moderation, and smoking cessation.;Monitor prescription use compliance.    Expected Outcomes Short Term: Continued assessment and intervention until BP is < 140/68mm HG in hypertensive participants. < 130/67mm HG in hypertensive participants with diabetes, heart failure or chronic kidney disease.;Long Term: Maintenance of blood pressure at goal levels.    Lipids Yes    Intervention Provide education and support for participant on nutrition & aerobic/resistive exercise along with prescribed medications to achieve LDL 70mg , HDL >40mg .    Expected Outcomes Short Term: Participant states understanding of desired cholesterol values and is compliant with medications prescribed. Participant is following exercise prescription and nutrition guidelines.;Long Term: Cholesterol controlled with medications as prescribed, with individualized exercise RX and with personalized nutrition plan. Value goals: LDL < 70mg , HDL > 40 mg.          Core Components/Risk Factors/Patient Goals Review:    Core Components/Risk Factors/Patient Goals at Discharge (Final Review):    ITP Comments:  ITP Comments     Row Name 04/22/24 1441           ITP Comments  Virtual orientation visit completed for pulmonary rehab with chronic systolic HF. On-site orientation visit scheduled for 04/25/24 at 0800.          Comments: Patient arrived for 1st visit/orientation/education at 0800. Patient was referred to PR by Dr. Ole Holts due to Chronic Systolic HF. During orientation advised patient on arrival and appointment times what to wear, what to do before, during and after exercise. Reviewed attendance and class policy.  Pt is scheduled to return Pulmonary rehab on  04/26/24 at 1500. Pt was advised to come to class 15 minutes before class starts.  Discussed RPE/Dpysnea scales. Patient participated in warm up stretches. Patient was able to complete 6 minute walk test. Patient was measured for the equipment. Discussed equipment safety with patient. Took patient pre-anthropometric measurements. Patient finished visit at 0900.

## 2024-04-26 ENCOUNTER — Encounter (HOSPITAL_COMMUNITY)
Admission: RE | Admit: 2024-04-26 | Discharge: 2024-04-26 | Disposition: A | Discharge status: Home / Self Care | Source: Ambulatory Visit | Attending: Cardiology | Admitting: Cardiology

## 2024-04-26 DIAGNOSIS — I5022 Chronic systolic (congestive) heart failure: Secondary | ICD-10-CM | POA: Diagnosis not present

## 2024-04-26 NOTE — Progress Notes (Signed)
 Daily Session Note  Patient Details  Name: Patrick Miranda MRN: [id number] Date of Birth: 07/23/1945 Referring Provider:   Flowsheet Row PULMONARY REHAB OTHER RESP ORIENTATION from 04/25/2024 in [facility]  Referring Provider Cindie Smalls MD    Encounter Date: 04/26/2024  Check In:  Session Check In - 04/26/24 1452       Check-In   Supervising physician immediately available to respond to emergencies See telemetry face sheet for immediately available MD    Location [facility]    Staff Present Laymon Rattler, BSN, RN, WTA-C;Heather Con, BS, Exercise Physiologist;Laureen Delores, BS, RRT, CPFT    Virtual Visit No    Medication changes reported     No    Fall or balance concerns reported    No    Tobacco Cessation No Change    Warm-up and Cool-down Performed on first and last piece of equipment    Resistance Training Performed Yes    VAD Patient? No    PAD/SET Patient? No      Pain Assessment   Currently in Pain? No/denies          Capillary Blood Glucose: No results found for this or any previous visit (from the past 24 hours).    Social History   Tobacco Use  Smoking Status Never  Smokeless Tobacco Never    Goals Met:  Independence with exercise equipment Exercise tolerated well No report of concerns or symptoms today Strength training completed today  Goals Unmet:  Not Applicable  Comments: First full day of exercise!  Patient was oriented to gym and equipment including functions, settings, policies, and procedures.  Patient's individual exercise prescription and treatment plan were reviewed.  All starting workloads were established based on the results of the 6 minute walk test done at initial orientation visit.  The plan for exercise progression was also introduced and progression will be customized based on patient's performance and goals.

## 2024-04-27 ENCOUNTER — Encounter (HOSPITAL_COMMUNITY): Payer: Self-pay | Admitting: *Deleted

## 2024-04-27 ENCOUNTER — Other Ambulatory Visit: Payer: Self-pay

## 2024-04-27 DIAGNOSIS — F01A0 Vascular dementia, mild, without behavioral disturbance, psychotic disturbance, mood disturbance, and anxiety: Secondary | ICD-10-CM

## 2024-04-27 DIAGNOSIS — I5022 Chronic systolic (congestive) heart failure: Secondary | ICD-10-CM

## 2024-04-27 MED ORDER — SERTRALINE HCL 25 MG PO TABS: 25.0000 mg | ORAL_TABLET | Freq: Every day | ORAL | 3 refills | Status: AC

## 2024-04-27 MED ORDER — DONEPEZIL HCL 10 MG PO TABS: 10.0000 mg | ORAL_TABLET | Freq: Every day | ORAL | 3 refills | Status: AC

## 2024-04-27 MED ORDER — MEMANTINE HCL 10 MG PO TABS: 10.0000 mg | ORAL_TABLET | Freq: Two times a day (BID) | ORAL | 3 refills | Status: AC

## 2024-04-27 NOTE — Progress Notes (Signed)
 Pulmonary Individual Treatment Plan  Patient Details  Name: Patrick Miranda MRN: [id number] Date of Birth: [birth year] Referring Provider:   Flowsheet Row PULMONARY REHAB OTHER RESP ORIENTATION from 04/25/2024 in [facility] CARDIAC REHABILITATION  Referring Provider Cindie Smalls MD    Initial Encounter Date:  Flowsheet Row PULMONARY REHAB OTHER RESP ORIENTATION from 04/25/2024 in [facility] CARDIAC REHABILITATION  Date 04/25/24    Visit Diagnosis: Chronic systolic HF (heart failure) (HCC)  Patient's Home Medications on Admission:   Current Outpatient Medications:    amiodarone  (PACERONE ) 200 MG tablet, 200 mg BID x 7 days, then 200 mg daily, Disp: 40 tablet, Rfl: 3   apixaban  (ELIQUIS ) 5 MG TABS tablet, TAKE 1 TABLET(5 MG) BY MOUTH TWICE DAILY, Disp: 180 tablet, Rfl: 1   BD PEN NEEDLE NANO U/F 32G X 4 MM MISC, 1 each by Other route daily. , Disp: , Rfl:    Biotin -Vitamin C (HAIR SKIN NAILS GUMMIES PO), Take 2 tablets by mouth daily., Disp: , Rfl:    Continuous Glucose Sensor (FREESTYLE LIBRE 3 PLUS SENSOR) MISC, by Does not apply route. Change sensor every 15 days., Disp: , Rfl:    Cyanocobalamin  (VITAMIN B-12) 5000 MCG SUBL, Place 1 tablet under the tongue daily., Disp: , Rfl:    donepezil  (ARICEPT ) 10 MG tablet, Take 1 tablet (10 mg total) by mouth at bedtime., Disp: 90 tablet, Rfl: 3   empagliflozin  ([name] ) 10 MG TABS tablet, Take 1 tablet (10 mg total) by mouth daily before breakfast., Disp: 90 tablet, Rfl: 1   fenofibrate  160 MG tablet, Take 160 mg by mouth every morning., Disp: , Rfl:    Ferrous Sulfate  (IRON PO), Take 1 tablet by mouth daily., Disp: , Rfl:    Insulin  Aspart (NOVOLOG  FLEXPEN [place]), Inject 8-10 Units into the skin 3 (three) times daily before meals. Dose per sliding scale, Disp: , Rfl:    insulin  degludec (TRESIBA FLEXTOUCH) 100 UNIT/ML FlexTouch Pen, Inject 32 Units into the skin daily., Disp: , Rfl:    levothyroxine  (SYNTHROID ) 50 MCG tablet, Take 50 mcg  by mouth every morning., Disp: , Rfl:    losartan  (COZAAR ) 25 MG tablet, TAKE 1/2 TABLET(12.5 MG) BY MOUTH DAILY, Disp: 45 tablet, Rfl: 1   memantine  (NAMENDA ) 10 MG tablet, Take 1 tablet (10 mg total) by mouth 2 (two) times daily., Disp: 180 tablet, Rfl: 3   metFORMIN  (GLUCOPHAGE ) 500 MG tablet, Take 500 mg by mouth 2 (two) times daily with a meal., Disp: , Rfl:    Multiple Vitamin (MULTIVITAMIN WITH MINERALS) TABS tablet, Take 1 tablet by mouth daily., Disp: , Rfl:    rosuvastatin  (CRESTOR ) 20 MG tablet, TAKE 1 TABLET(20 MG) BY MOUTH DAILY, Disp: 90 tablet, Rfl: 1   sertraline  (ZOLOFT ) 25 MG tablet, Take 1 tablet (25 mg total) by mouth daily., Disp: 90 tablet, Rfl: 3   tamsulosin  (FLOMAX ) 0.4 MG CAPS capsule, Take 0.4 mg by mouth at bedtime., Disp: , Rfl:    Vedolizumab  (ENTYVIO  IV), Inject into the vein every 28 (twenty-eight) days., Disp: , Rfl:   Past Medical History: Past Medical History:  Diagnosis Date   Arthritis    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 s/p CABG in 06/2020 with LIMA-LAD, SVG-D1 and SVG-RCA   COVID-07 Dec 2019   Dementia [facility])    Depression    Essential hypertension    Hernia, umbilical    HOH (  hard of hearing)    Hyperlipidemia    Ischemic cardiomyopathy    Ischemic necrosis of small bowel    Left ventricular mural thrombus    NSTEMI (non-ST elevated myocardial infarction) (HCC)    OSA on CPAP    Pulmonary emboli [facility])    May 2021   Skin cancer    Type 2 diabetes mellitus (HCC)     Tobacco Use: Social History   Tobacco Use  Smoking Status Never  Smokeless Tobacco Never    Labs: Review Flowsheet  More data exists      Latest Ref Rng & Units 07/14/2020 09/09/2020 05/08/2021 06/17/2023 01/06/2024  Labs for ITP Cardiac and Pulmonary Rehab  Cholestrol 0 - 200 mg/dL - 65  - - -  LDL (calc) 0 - 99 mg/dL - 25  - - -  HDL-C >59 mg/dL - 21  - - -  Trlycerides <150 mg/dL - 94  - - -   Hemoglobin A1c 4.8 - 5.6 % - 6.8  8.8  8.1  7.8   Bicarbonate 20.0 - 28.0 mmol/L - - - - 21.9   TCO2 22 - 32 mmol/L - - - - 23   Acid-base deficit 0.0 - 2.0 mmol/L - - - - 4.0   O2 Saturation % 55.5  - - - 74     Capillary Blood Glucose: Lab Results  Component Value Date   GLUCAP 205 (H) 01/12/2024   GLUCAP 235 (H) 01/12/2024   GLUCAP 166 (H) 01/11/2024   GLUCAP 122 (H) 01/11/2024   GLUCAP 127 (H) 01/11/2024     Pulmonary Assessment Scores:  Pulmonary Assessment Scores     Row Name 04/25/24 0852         ADL UCSD   ADL Phase Entry     SOB Score total 21     Rest 0     Walk 0     Stairs 3     Bath 0     Dress 0     Shop 3       CAT Score   CAT Score 14       mMRC Score   mMRC Score 3       UCSD: Self-administered rating of dyspnea associated with activities of daily living (ADLs) 6-point scale (0 = not at all to 5 = maximal or unable to do because of breathlessness)  Scoring Scores range from 0 to 120.  Minimally important difference is 5 units  CAT: CAT can identify the health impairment of COPD patients and is better correlated with disease progression.  CAT has a scoring range of zero to 40. The CAT score is classified into four groups of low (less than 10), medium (10 - 20), high (21-30) and very high (31-40) based on the impact level of disease on health status. A CAT score over 10 suggests significant symptoms.  A worsening CAT score could be explained by an exacerbation, poor medication adherence, poor inhaler technique, or progression of COPD or comorbid conditions.  CAT MCID is 2 points  mMRC: mMRC (Modified Medical Research Council) Dyspnea Scale is used to assess the degree of baseline functional disability in patients of respiratory disease due to dyspnea. No minimal important difference is established. A decrease in score of 1 point or greater is considered a positive change.   Pulmonary Function Assessment:   Exercise Target  Goals: Exercise Program Goal: Individual exercise prescription set using results from initial 6 min walk test and THRR while  considering  patient's activity barriers and safety.   Exercise Prescription Goal: Initial exercise prescription builds to 30-45 minutes a day of aerobic activity, 2-3 days per week.  Home exercise guidelines will be given to patient during program as part of exercise prescription that the participant will acknowledge.  Activity Barriers & Risk Stratification:  Activity Barriers & Cardiac Risk Stratification - 04/25/24 0950       Activity Barriers & Cardiac Risk Stratification   Activity Barriers Assistive Device;Deconditioning;Balance Concerns;Arthritis;Left Knee Replacement;Right Knee Replacement;Shortness of Breath;Decreased Ventricular Function;Muscular Weakness    Cardiac Risk Stratification High          6 Minute Walk:  6 Minute Walk     Row Name 04/25/24 0947         6 Minute Walk   Phase Initial     Distance 895 feet     Walk Time 6 minutes     # of Rest Breaks 0     MPH 1.69     METS 1.74     RPE 13     Perceived Dyspnea  1     VO2 Peak 6.08     Symptoms No     Resting HR 63 bpm     Resting BP 116/60     Resting Oxygen  Saturation  96 %     Exercise Oxygen  Saturation  during 6 min walk 93 %     Max Ex. HR 81 bpm     Max Ex. BP 144/66     2 Minute Post BP 134/70       Interval HR   1 Minute HR 86     2 Minute HR 86     3 Minute HR 90     4 Minute HR 92     5 Minute HR 91     6 Minute HR 90     2 Minute Post HR 69     Interval Heart Rate? Yes       Interval Oxygen    Interval Oxygen ? Yes     Baseline Oxygen  Saturation % 96 %     1 Minute Oxygen  Saturation % 95 %     1 Minute Liters of Oxygen  0 L  Room Air     2 Minute Oxygen  Saturation % 93 %     2 Minute Liters of Oxygen  0 L     3 Minute Oxygen  Saturation % 94 %     3 Minute Liters of Oxygen  0 L     4 Minute Oxygen  Saturation % 94 %     4 Minute Liters of Oxygen  0 L     5  Minute Oxygen  Saturation % 95 %     5 Minute Liters of Oxygen  0 L     6 Minute Oxygen  Saturation % 95 %     6 Minute Liters of Oxygen  0 L     2 Minute Post Oxygen  Saturation % 96 %     2 Minute Post Liters of Oxygen  0 L        Oxygen  Initial Assessment:  Oxygen  Initial Assessment - 04/22/24 1428       Home Oxygen    Home Oxygen  Device None    Sleep Oxygen  Prescription CPAP;None      Intervention   Short Term Goals To learn and understand importance of monitoring SPO2 with pulse oximeter and demonstrate accurate use of the pulse oximeter.;To learn and understand importance of maintaining oxygen  saturations>88%;To learn and demonstrate  proper pursed lip breathing techniques or other breathing techniques. ;To learn and demonstrate proper use of respiratory medications    Long  Term Goals Verbalizes importance of monitoring SPO2 with pulse oximeter and return demonstration;Maintenance of O2 saturations>88%;Exhibits proper breathing techniques, such as pursed lip breathing or other method taught during program session;Compliance with respiratory medication;Demonstrates proper use of MDI's          Oxygen  Re-Evaluation:   Oxygen  Discharge (Final Oxygen  Re-Evaluation):   Initial Exercise Prescription:  Initial Exercise Prescription - 04/25/24 0900       Date of Initial Exercise RX and Referring Provider   Date 04/25/24    Referring Provider Cindie Smalls MD      Oxygen    Maintain Oxygen  Saturation 88% or higher      NuStep   Level 1    SPM 80    Minutes 15    METs 1.7      REL-XR   Level 1    Speed 50    Minutes 15    METs 1.7      Prescription Details   Frequency (times per week) 2    Duration Progress to 30 minutes of continuous aerobic without signs/symptoms of physical distress      Intensity   THRR 40-80% of Max Heartrate 94-125    Ratings of Perceived Exertion 11-13    Perceived Dyspnea 0-4      Progression   Progression Continue to progress workloads  to maintain intensity without signs/symptoms of physical distress.      Resistance Training   Training Prescription Yes    Weight 3 lb    Reps 10-15          Perform Capillary Blood Glucose checks as needed.  Exercise Prescription Changes:   Exercise Prescription Changes     Row Name 04/25/24 0900             Response to Exercise   Blood Pressure (Admit) 116/60       Blood Pressure (Exercise) 144/66       Blood Pressure (Exit) 132/60       Heart Rate (Admit) 63 bpm       Heart Rate (Exercise) 92 bpm       Heart Rate (Exit) 62 bpm       Oxygen  Saturation (Admit) 96 %       Oxygen  Saturation (Exercise) 93 %       Oxygen  Saturation (Exit) 97 %       Rating of Perceived Exertion (Exercise) 13       Perceived Dyspnea (Exercise) 1       Symptoms none       Comments walk test results          Exercise Comments:   Exercise Comments     Row Name 04/26/24 1453           Exercise Comments First full day of exercise!  Patient was oriented to gym and equipment including functions, settings, policies, and procedures.  Patient's individual exercise prescription and treatment plan were reviewed.  All starting workloads were established based on the results of the 6 minute walk test done at initial orientation visit.  The plan for exercise progression was also introduced and progression will be customized based on patient's performance and goals.          Exercise Goals and Review:   Exercise Goals     Row Name 04/25/24 [phone]  Exercise Goals   Increase Physical Activity Yes       Intervention Provide advice, education, support and counseling about physical activity/exercise needs.;Develop an individualized exercise prescription for aerobic and resistive training based on initial evaluation findings, risk stratification, comorbidities and participant's personal goals.       Expected Outcomes Short Term: Attend rehab on a regular basis to increase amount of  physical activity.;Long Term: Add in home exercise to make exercise part of routine and to increase amount of physical activity.;Long Term: Exercising regularly at least 3-5 days a week.       Increase Strength and Stamina Yes       Intervention Provide advice, education, support and counseling about physical activity/exercise needs.;Develop an individualized exercise prescription for aerobic and resistive training based on initial evaluation findings, risk stratification, comorbidities and participant's personal goals.       Expected Outcomes Short Term: Increase workloads from initial exercise prescription for resistance, speed, and METs.;Short Term: Perform resistance training exercises routinely during rehab and add in resistance training at home;Long Term: Improve cardiorespiratory fitness, muscular endurance and strength as measured by increased METs and functional capacity ( )       Able to understand and use rate of perceived exertion (RPE) scale Yes       Intervention Provide education and explanation on how to use RPE scale       Expected Outcomes Short Term: Able to use RPE daily in rehab to express subjective intensity level;Long Term:  Able to use RPE to guide intensity level when exercising independently       Able to understand and use Dyspnea scale Yes       Intervention Provide education and explanation on how to use Dyspnea scale       Expected Outcomes Short Term: Able to use Dyspnea scale daily in rehab to express subjective sense of shortness of breath during exertion;Long Term: Able to use Dyspnea scale to guide intensity level when exercising independently       Knowledge and understanding of Target Heart Rate Range (THRR) Yes       Intervention Provide education and explanation of THRR including how the numbers were predicted and where they are located for reference       Expected Outcomes Short Term: Able to state/look up THRR;Short Term: Able to use daily as guideline for  intensity in rehab;Long Term: Able to use THRR to govern intensity when exercising independently       Able to check pulse independently Yes       Intervention Provide education and demonstration on how to check pulse in carotid and radial arteries.;Review the importance of being able to check your own pulse for safety during independent exercise       Expected Outcomes Short Term: Able to explain why pulse checking is important during independent exercise;Long Term: Able to check pulse independently and accurately       Understanding of Exercise Prescription Yes       Intervention Provide education, explanation, and written materials on patient's individual exercise prescription       Expected Outcomes Short Term: Able to explain program exercise prescription;Long Term: Able to explain home exercise prescription to exercise independently          Exercise Goals Re-Evaluation :  Exercise Goals Re-Evaluation     Row Name 04/26/24 1453             Exercise Goal Re-Evaluation   Exercise Goals Review Increase Physical Activity;Able  to understand and use Dyspnea scale;Increase Strength and Stamina;Able to check pulse independently;Able to understand and use rate of perceived exertion (RPE) scale;Knowledge and understanding of Target Heart Rate Range (THRR);Understanding of Exercise Prescription       Comments Reviewed RPE and dyspnea scale, THR and program prescription with pt today.  Pt voiced understanding and was given a copy of goals to take home.       Expected Outcomes Short: Use RPE daily to regulate intensity.  Long: Follow program prescription in THR.          Discharge Exercise Prescription (Final Exercise Prescription Changes):  Exercise Prescription Changes - 04/25/24 0900       Response to Exercise   Blood Pressure (Admit) 116/60    Blood Pressure (Exercise) 144/66    Blood Pressure (Exit) 132/60    Heart Rate (Admit) 63 bpm    Heart Rate (Exercise) 92 bpm    Heart Rate  (Exit) 62 bpm    Oxygen  Saturation (Admit) 96 %    Oxygen  Saturation (Exercise) 93 %    Oxygen  Saturation (Exit) 97 %    Rating of Perceived Exertion (Exercise) 13    Perceived Dyspnea (Exercise) 1    Symptoms none    Comments walk test results          Nutrition:  Target Goals: Understanding of nutrition guidelines, daily intake of sodium 1500mg , cholesterol 200mg , calories 30% from fat and 7% or less from saturated fats, daily to have 5 or more servings of fruits and vegetables.  Biometrics:  Pre Biometrics - 04/25/24 0954       Pre Biometrics   Height 5' 8 (1.727 m)    Weight 179 lb 14.4 oz (81.6 kg)    Waist Circumference 36 inches    Hip Circumference 39 inches    Waist to Hip Ratio 0.92 %    BMI (Calculated) 27.36    Grip Strength 23.2 kg    Single Leg Stand 2.1 seconds           Nutrition Therapy Plan and Nutrition Goals:   Nutrition Assessments:  MEDIFICTS Score Key: >=70 Need to make dietary changes  40-70 Heart Healthy Diet <= 40 Therapeutic Level Cholesterol Diet  Flowsheet Row PULMONARY VIRTUAL BASED CARE from 04/22/2024 in [facility] CARDIAC REHABILITATION  Picture Your Plate Total Score on Admission 50   Picture Your Plate Scores: <59 Unhealthy dietary pattern with much room for improvement. 41-50 Dietary pattern unlikely to meet recommendations for good health and room for improvement. 51-60 More healthful dietary pattern, with some room for improvement.  >60 Healthy dietary pattern, although there may be some specific behaviors that could be improved.    Nutrition Goals Re-Evaluation:   Nutrition Goals Discharge (Final Nutrition Goals Re-Evaluation):   Psychosocial: Target Goals: Acknowledge presence or absence of significant depression and/or stress, maximize coping skills, provide positive support system. Participant is able to verbalize types and ability to use techniques and skills needed for reducing stress and  depression.  Initial Review & Psychosocial Screening:  Initial Psych Review & Screening - 04/22/24 1432       Initial Review   Current issues with Current Psychotropic Meds;Current Sleep Concerns;Current Depression      Family Dynamics   Good Support System? Yes    Comments Patient's wife and daughter support him.      Barriers   Psychosocial barriers to participate in program The patient should benefit from training in stress management and relaxation.;There are no  identifiable barriers or psychosocial needs.      Screening Interventions   Interventions Encouraged to exercise;To provide support and resources with identified psychosocial needs;Provide feedback about the scores to participant    Expected Outcomes Short Term goal: Utilizing psychosocial counselor, staff and physician to assist with identification of specific Stressors or current issues interfering with healing process. Setting desired goal for each stressor or current issue identified.;Long Term Goal: Stressors or current issues are controlled or eliminated.;Short Term goal: Identification and review with participant of any Quality of Life or Depression concerns found by scoring the questionnaire.;Long Term goal: The participant improves quality of Life and PHQ9 Scores as seen by post scores and/or verbalization of changes          Quality of Life Scores:  Scores of 19 and below usually indicate a poorer quality of life in these areas.  A difference of  2-3 points is a clinically meaningful difference.  A difference of 2-3 points in the total score of the Quality of Life Index has been associated with significant improvement in overall quality of life, self-image, physical symptoms, and general health in studies assessing change in quality of life.   PHQ-9: Review Flowsheet       04/25/2024 01/19/2020 12/20/2019  Depression screen PHQ 2/9  Decreased Interest 3 0 0  Down, Depressed, Hopeless 2 0 1  PHQ - 2 Score 5 0 1   Altered sleeping 3 - -  Tired, decreased energy 3 - -  Change in appetite 3 - -  Feeling bad or failure about yourself  0 - -  Trouble concentrating 3 - -  Moving slowly or fidgety/restless 0 - -  Suicidal thoughts 0 - -  PHQ-9 Score 17 - -  Difficult doing work/chores Very difficult - -   Interpretation of Total Score  Total Score Depression Severity:  1-4 = Minimal depression, 5-9 = Mild depression, 10-14 = Moderate depression, 15-19 = Moderately severe depression, 20-27 = Severe depression   Psychosocial Evaluation and Intervention:  Psychosocial Evaluation - 04/22/24 1432       Psychosocial Evaluation & Interventions   Interventions Relaxation education;Encouraged to exercise with the program and follow exercise prescription;Stress management education    Comments Patient was referred to pulmonary rehab with chronic systolic HF. His wife and daughter answered questions for virtual call. He has mild dementia and is being treated with Aricept . He also has some depression treated with Zoloft . He and his wife live with their daughter. His wife says he sleeps 90% of the time. She says he is able to follow directions but may need repeating at times. Her goals for him is to get him moving; to increase his activity level and that he will sleep less. There are no barriers identified for him to complete the program.    Expected Outcomes Short Term: Patient will start the program and attend consistently. Long Term: Patient will complete the program meeting personal goals.    Continue Psychosocial Services  Follow up required by staff          Psychosocial Re-Evaluation:   Psychosocial Discharge (Final Psychosocial Re-Evaluation):    Education: Education Goals: Education classes will be provided on a weekly basis, covering required topics. Participant will state understanding/return demonstration of topics presented.  Learning Barriers/Preferences:  Learning Barriers/Preferences -  04/22/24 1428       Learning Barriers/Preferences   Learning Barriers Hearing   Patient has dementia.   Learning Preferences Skilled Demonstration  Education Topics: How Lungs Work and Diseases: - Discuss the anatomy of the lungs and diseases that can affect the lungs, such as COPD.   Exercise: -Discuss the importance of exercise, FITT principles of exercise, normal and abnormal responses to exercise, and how to exercise safely.   Environmental Irritants: -Discuss types of environmental irritants and how to limit exposure to environmental irritants.   Meds/Inhalers and oxygen : - Discuss respiratory medications, definition of an inhaler and oxygen , and the proper way to use an inhaler and oxygen .   Energy Saving Techniques: - Discuss methods to conserve energy and decrease shortness of breath when performing activities of daily living.    Bronchial Hygiene / Breathing Techniques: - Discuss breathing mechanics, pursed-lip breathing technique,  proper posture, effective ways to clear airways, and other functional breathing techniques   Cleaning Equipment: - Provides group verbal and written instruction about the health risks of elevated stress, cause of high stress, and healthy ways to reduce stress.   Nutrition I: Fats: - Discuss the types of cholesterol, what cholesterol does to the body, and how cholesterol levels can be controlled.   Nutrition II: Labels: -Discuss the different components of food labels and how to read food labels.   Respiratory Infections: - Discuss the signs and symptoms of respiratory infections, ways to prevent respiratory infections, and the importance of seeking medical treatment when having a respiratory infection.   Stress I: Signs and Symptoms: - Discuss the causes of stress, how stress may lead to anxiety and depression, and ways to limit stress.   Stress II: Relaxation: -Discuss relaxation techniques to limit  stress.   Oxygen  for Home/Travel: - Discuss how to prepare for travel when on oxygen  and proper ways to transport and store oxygen  to ensure safety.   Knowledge Questionnaire Score:  Knowledge Questionnaire Score - 04/22/24 1430       Knowledge Questionnaire Score   Pre Score 13/18          Core Components/Risk Factors/Patient Goals at Admission:  Personal Goals and Risk Factors at Admission - 04/25/24 0955       Core Components/Risk Factors/Patient Goals on Admission    Weight Management Yes;Weight Maintenance    Intervention Weight Management: Develop a combined nutrition and exercise program designed to reach desired caloric intake, while maintaining appropriate intake of nutrient and fiber, sodium and fats, and appropriate energy expenditure required for the weight goal.;Weight Management: Provide education and appropriate resources to help participant work on and attain dietary goals.    Admit Weight 179 lb 14.4 oz (81.6 kg)    Goal Weight: Short Term 174 lb (78.9 kg)    Goal Weight: Long Term 170 lb (77.1 kg)    Expected Outcomes Short Term: Continue to assess and modify interventions until short term weight is achieved;Long Term: Adherence to nutrition and physical activity/exercise program aimed toward attainment of established weight goal;Weight Maintenance: Understanding of the daily nutrition guidelines, which includes 25-35% calories from fat, 7% or less cal from saturated fats, less than 200mg  cholesterol, less than 1.5gm of sodium, & 5 or more servings of fruits and vegetables daily;Weight Loss: Understanding of general recommendations for a balanced deficit meal plan, which promotes 1-2 lb weight loss per week and includes a negative energy balance of [phone] kcal/d;Understanding recommendations for meals to include 15-35% energy as protein, 25-35% energy from fat, 35-60% energy from carbohydrates, less than 200mg  of dietary cholesterol, 20-35 gm of total fiber  daily;Understanding of distribution of calorie intake throughout the day  with the consumption of 4-5 meals/snacks    Improve shortness of breath with ADL's Yes    Intervention Provide education, individualized exercise plan and daily activity instruction to help decrease symptoms of SOB with activities of daily living.    Expected Outcomes Short Term: Improve cardiorespiratory fitness to achieve a reduction of symptoms when performing ADLs;Long Term: Be able to perform more ADLs without symptoms or delay the onset of symptoms    Diabetes Yes    Intervention Provide education about signs/symptoms and action to take for hypo/hyperglycemia.;Provide education about proper nutrition, including hydration, and aerobic/resistive exercise prescription along with prescribed medications to achieve blood glucose in normal ranges: Fasting glucose 65-99 mg/dL    Expected Outcomes Short Term: Participant verbalizes understanding of the signs/symptoms and immediate care of hyper/hypoglycemia, proper foot care and importance of medication, aerobic/resistive exercise and nutrition plan for blood glucose control.;Long Term: Attainment of HbA1C < 7%.    Heart Failure Yes    Intervention Provide a combined exercise and nutrition program that is supplemented with education, support and counseling about heart failure. Directed toward relieving symptoms such as shortness of breath, decreased exercise tolerance, and extremity edema.    Expected Outcomes Improve functional capacity of life;Short term: Attendance in program 2-3 days a week with increased exercise capacity. Reported lower sodium intake. Reported increased fruit and vegetable intake. Reports medication compliance.;Short term: Daily weights obtained and reported for increase. Utilizing diuretic protocols set by physician.;Long term: Adoption of self-care skills and reduction of barriers for early signs and symptoms recognition and intervention leading to self-care  maintenance.    Hypertension Yes    Intervention Provide education on lifestyle modifcations including regular physical activity/exercise, weight management, moderate sodium restriction and increased consumption of fresh fruit, vegetables, and low fat dairy, alcohol  moderation, and smoking cessation.;Monitor prescription use compliance.    Expected Outcomes Short Term: Continued assessment and intervention until BP is < 140/75mm HG in hypertensive participants. < 130/83mm HG in hypertensive participants with diabetes, heart failure or chronic kidney disease.;Long Term: Maintenance of blood pressure at goal levels.    Lipids Yes    Intervention Provide education and support for participant on nutrition & aerobic/resistive exercise along with prescribed medications to achieve LDL 70mg , HDL >40mg .    Expected Outcomes Short Term: Participant states understanding of desired cholesterol values and is compliant with medications prescribed. Participant is following exercise prescription and nutrition guidelines.;Long Term: Cholesterol controlled with medications as prescribed, with individualized exercise RX and with personalized nutrition plan. Value goals: LDL < 70mg , HDL > 40 mg.          Core Components/Risk Factors/Patient Goals Review:    Core Components/Risk Factors/Patient Goals at Discharge (Final Review):    ITP Comments:  ITP Comments     Row Name 04/22/24 1441 04/26/24 1453 04/27/24 1815       ITP Comments Virtual orientation visit completed for pulmonary rehab with chronic systolic HF. On-site orientation visit scheduled for 04/25/24 at 0800. First full day of exercise!  Patient was oriented to gym and equipment including functions, settings, policies, and procedures.  Patient's individual exercise prescription and treatment plan were reviewed.  All starting workloads were established based on the results of the 6 minute walk test done at initial orientation visit.  The plan for  exercise progression was also introduced and progression will be customized based on patient's performance and goals. 30 day review completed. ITP sent to Dr.Jehanzeb Memon, Medical Director of  Pulmonary Rehab. Continue with ITP  unless changes are made by physician.  He only started to exercise yesterday.        Comments: 30 day review

## 2024-04-27 NOTE — Telephone Encounter (Signed)
 Last office visit : 11/03/23 Next office visit : 10/30/5  Per last office visit:  - Continue Namenda  10 mg twice daily  - Continue donepezil  10 mg daily - Continue sertraline  25 mg daily for depression management

## 2024-04-28 ENCOUNTER — Encounter (HOSPITAL_COMMUNITY)

## 2024-05-03 ENCOUNTER — Telehealth: Payer: Self-pay

## 2024-05-03 ENCOUNTER — Encounter (HOSPITAL_COMMUNITY)
Admission: RE | Admit: 2024-05-03 | Discharge: 2024-05-03 | Disposition: A | Source: Ambulatory Visit | Attending: Cardiology

## 2024-05-03 DIAGNOSIS — I5022 Chronic systolic (congestive) heart failure: Secondary | ICD-10-CM

## 2024-05-03 NOTE — Telephone Encounter (Signed)
 2026 Renewal  PAP: Patient assistance application for [name]  through [employer]) has been mailed to pt's home address on file. Provider portion of application will be faxed to provider's office.   PAP: Patient assistance application for Novolog  and Tresiba through [employer] has been mailed to [employer] home address on file. Provider portion of application will be faxed to provider's office.  Provider portion has been faxed to Dr. Charlie Love  [name]  provider portion has been faxed to Dr. Sheppard Sierras

## 2024-05-03 NOTE — Progress Notes (Signed)
 Daily Session Note  Patient Details  Name: Patrick Miranda MRN: [id number] Date of Birth: [birth year] Referring Provider:   Flowsheet Row PULMONARY REHAB OTHER RESP ORIENTATION from 04/25/2024 in [facility] CARDIAC REHABILITATION  Referring Provider Cindie Smalls MD    Encounter Date: 05/03/2024  Check In:  Session Check In - 05/03/24 1439       Check-In   Supervising physician immediately available to respond to emergencies See telemetry face sheet for immediately available MD    Location [facility]    Staff Present Powell Benders, BS, Exercise Physiologist;Jameek Bruntz Jackquline, BSN, RN, WTA-C    Virtual Visit No    Medication changes reported     No    Fall or balance concerns reported    No    Tobacco Cessation No Change    Warm-up and Cool-down Performed on first and last piece of equipment    Resistance Training Performed Yes    VAD Patient? No    PAD/SET Patient? No      Pain Assessment   Currently in Pain? No/denies          Capillary Blood Glucose: No results found for this or any previous visit (from the past 24 hours).    Social History   Tobacco Use  Smoking Status Never  Smokeless Tobacco Never    Goals Met:  Proper associated with RPD/PD & O2 Sat Independence with exercise equipment Improved SOB with ADL's Using PLB without cueing & demonstrates good technique Exercise tolerated well No report of concerns or symptoms today Strength training completed today  Goals Unmet:  Not Applicable  Comments: Pt able to follow exercise prescription today without complaint.  Will continue to monitor for progression.

## 2024-05-05 ENCOUNTER — Encounter (HOSPITAL_COMMUNITY)
Admission: RE | Admit: 2024-05-05 | Discharge: 2024-05-05 | Disposition: A | Discharge status: Home / Self Care | Source: Ambulatory Visit | Attending: Cardiology | Admitting: Cardiology

## 2024-05-05 ENCOUNTER — Encounter (HOSPITAL_COMMUNITY)

## 2024-05-05 DIAGNOSIS — I5022 Chronic systolic (congestive) heart failure: Secondary | ICD-10-CM

## 2024-05-05 NOTE — Progress Notes (Signed)
 Daily Session Note  Patient Details  Name: Patrick Miranda MRN: [id number] Date of Birth: 08-08-1945 Referring Provider:   Flowsheet Row PULMONARY REHAB OTHER RESP ORIENTATION from 04/25/2024 in [facility] REHABILITATION  Referring Provider Cindie Smalls MD    Encounter Date: 05/05/2024  Check In:  Session Check In - 05/05/24 1520       Check-In   Supervising physician immediately available to respond to emergencies See telemetry face sheet for immediately available MD    Location [facility]    Staff Present Powell Benders, BS, Exercise Physiologist;Hillary Fertile BSN, RN;Jessica Catlin, MA, RCEP, CCRP, CCET    Virtual Visit No    Medication changes reported     No    Fall or balance concerns reported    No    Tobacco Cessation No Change    Warm-up and Cool-down Performed on first and last piece of equipment    Resistance Training Performed Yes    VAD Patient? No    PAD/SET Patient? No      Pain Assessment   Currently in Pain? No/denies    Multiple Pain Sites No          Capillary Blood Glucose: No results found for this or any previous visit (from the past 24 hours).    Social History   Tobacco Use  Smoking Status Never  Smokeless Tobacco Never    Goals Met:  Independence with exercise equipment Exercise tolerated well Queuing for purse lip breathing No report of concerns or symptoms today Strength training completed today  Goals Unmet:  Not Applicable  Comments: Pt able to follow exercise prescription today without complaint.  Will continue to monitor for progression.

## 2024-05-10 ENCOUNTER — Encounter (HOSPITAL_COMMUNITY)
Admission: RE | Admit: 2024-05-10 | Discharge: 2024-05-10 | Disposition: A | Source: Ambulatory Visit | Attending: Cardiology

## 2024-05-10 DIAGNOSIS — I5022 Chronic systolic (congestive) heart failure: Secondary | ICD-10-CM | POA: Diagnosis not present

## 2024-05-10 NOTE — Progress Notes (Signed)
 Daily Session Note  Patient Details  Name: Advaith Lamarque Berisha MRN: [id number] Date of Birth: [birth year] Referring Provider:   Flowsheet Row PULMONARY REHAB OTHER RESP ORIENTATION from 04/25/2024 in [facility] CARDIAC REHABILITATION  Referring Provider Cindie Smalls MD    Encounter Date: 05/10/2024  Check In:  Session Check In - 05/10/24 1456       Check-In   Supervising physician immediately available to respond to emergencies See telemetry face sheet for immediately available MD    Location [facility]    Staff Present Richerd Buddle, RN;Heather Con, [place], Exercise Physiologist;Adelheid Hoggard Jackquline, BSN, RN, WTA-C    Virtual Visit No    Medication changes reported     No    Fall or balance concerns reported    No    Tobacco Cessation No Change    Warm-up and Cool-down Performed on first and last piece of equipment    Resistance Training Performed Yes    VAD Patient? No    PAD/SET Patient? No      Pain Assessment   Currently in Pain? No/denies          Capillary Blood Glucose: No results found for this or any previous visit (from the past 24 hours).    Social History   Tobacco Use  Smoking Status Never  Smokeless Tobacco Never    Goals Met:  Proper associated with RPD/PD & O2 Sat Independence with exercise equipment Improved SOB with ADL's Using PLB without cueing & demonstrates good technique Exercise tolerated well No report of concerns or symptoms today Strength training completed today  Goals Unmet:  Not Applicable  Comments: Pt able to follow exercise prescription today without complaint.  Will continue to monitor for progression.\

## 2024-05-11 DIAGNOSIS — E039 Hypothyroidism, unspecified: Secondary | ICD-10-CM | POA: Diagnosis not present

## 2024-05-11 DIAGNOSIS — E785 Hyperlipidemia, unspecified: Secondary | ICD-10-CM | POA: Diagnosis not present

## 2024-05-11 DIAGNOSIS — D649 Anemia, unspecified: Secondary | ICD-10-CM | POA: Diagnosis not present

## 2024-05-11 DIAGNOSIS — I129 Hypertensive chronic kidney disease with stage 1 through stage 4 chronic kidney disease, or unspecified chronic kidney disease: Secondary | ICD-10-CM | POA: Diagnosis not present

## 2024-05-11 DIAGNOSIS — E1129 Type 2 diabetes mellitus with other diabetic kidney complication: Secondary | ICD-10-CM | POA: Diagnosis not present

## 2024-05-12 ENCOUNTER — Encounter (HOSPITAL_COMMUNITY)
Admission: RE | Admit: 2024-05-12 | Discharge: 2024-05-12 | Disposition: A | Discharge status: Home / Self Care | Source: Ambulatory Visit | Attending: Cardiology | Admitting: Cardiology

## 2024-05-12 DIAGNOSIS — E1129 Type 2 diabetes mellitus with other diabetic kidney complication: Secondary | ICD-10-CM | POA: Diagnosis not present

## 2024-05-12 DIAGNOSIS — I5022 Chronic systolic (congestive) heart failure: Secondary | ICD-10-CM | POA: Diagnosis not present

## 2024-05-12 DIAGNOSIS — E1165 Type 2 diabetes mellitus with hyperglycemia: Secondary | ICD-10-CM | POA: Diagnosis not present

## 2024-05-12 NOTE — Progress Notes (Signed)
 Daily Session Note  Patient Details  Name: Patrick Miranda MRN: [id number] Date of Birth: Aug 20, 1945 Referring Provider:   Flowsheet Row PULMONARY REHAB OTHER RESP ORIENTATION from 04/25/2024 in [facility] CARDIAC REHABILITATION  Referring Provider Cindie Smalls MD    Encounter Date: 05/12/2024  Check In:  Session Check In - 05/12/24 1541       Check-In   Supervising physician immediately available to respond to emergencies See telemetry face sheet for immediately available MD    Location [facility]    Staff Present Powell Benders, BS, Exercise Physiologist;Liela Rylee Vonzell, MA, RCEP, CCRP, Sueellen Louder, RN, BSN    Virtual Visit No    Medication changes reported     No    Fall or balance concerns reported    No    Warm-up and Cool-down Performed on first and last piece of equipment    Resistance Training Performed Yes    VAD Patient? No    PAD/SET Patient? No      Pain Assessment   Currently in Pain? No/denies          Capillary Blood Glucose: No results found for this or any previous visit (from the past 24 hours).    Social History   Tobacco Use  Smoking Status Never  Smokeless Tobacco Never    Goals Met:  Proper associated with RPD/PD & O2 Sat Using PLB without cueing & demonstrates good technique Exercise tolerated well Queuing for purse lip breathing No report of concerns or symptoms today Strength training completed today  Goals Unmet:  Not Applicable  Comments: Pt able to follow exercise prescription today without complaint.  Will continue to monitor for progression.

## 2024-05-17 ENCOUNTER — Encounter (HOSPITAL_COMMUNITY)

## 2024-05-18 DIAGNOSIS — I129 Hypertensive chronic kidney disease with stage 1 through stage 4 chronic kidney disease, or unspecified chronic kidney disease: Secondary | ICD-10-CM | POA: Diagnosis not present

## 2024-05-18 DIAGNOSIS — F03A0 Unspecified dementia, mild, without behavioral disturbance, psychotic disturbance, mood disturbance, and anxiety: Secondary | ICD-10-CM | POA: Diagnosis not present

## 2024-05-18 DIAGNOSIS — E039 Hypothyroidism, unspecified: Secondary | ICD-10-CM | POA: Diagnosis not present

## 2024-05-18 DIAGNOSIS — R82998 Other abnormal findings in urine: Secondary | ICD-10-CM | POA: Diagnosis not present

## 2024-05-18 DIAGNOSIS — Z0000 Encounter for general adult medical examination without abnormal findings: Secondary | ICD-10-CM | POA: Diagnosis not present

## 2024-05-18 DIAGNOSIS — I10 Essential (primary) hypertension: Secondary | ICD-10-CM | POA: Diagnosis not present

## 2024-05-18 DIAGNOSIS — D638 Anemia in other chronic diseases classified elsewhere: Secondary | ICD-10-CM | POA: Diagnosis not present

## 2024-05-18 DIAGNOSIS — Z23 Encounter for immunization: Secondary | ICD-10-CM | POA: Diagnosis not present

## 2024-05-18 DIAGNOSIS — Z1331 Encounter for screening for depression: Secondary | ICD-10-CM | POA: Diagnosis not present

## 2024-05-18 DIAGNOSIS — N1832 Chronic kidney disease, stage 3b: Secondary | ICD-10-CM | POA: Diagnosis not present

## 2024-05-18 DIAGNOSIS — Z1339 Encounter for screening examination for other mental health and behavioral disorders: Secondary | ICD-10-CM | POA: Diagnosis not present

## 2024-05-18 DIAGNOSIS — E1129 Type 2 diabetes mellitus with other diabetic kidney complication: Secondary | ICD-10-CM | POA: Diagnosis not present

## 2024-05-18 NOTE — Progress Notes (Unsigned)
 [facility] [address] [place]. [place] [address] [phone]       OFFICE FOLLOW UP VISIT NOTE  [name] Date of Birth:  1946/06/28 Medical Record Number:  [id number]   Primary neurologist: Dr. Rosemarie Referring MD: Dr. Jerri Reason for Referral: Dementia  Chief Complaint  Patient presents with   Follow-up    Pt in 3 wife Pt here for memory f/u  Wife states pt memory different daily       HPI:   Initial visit 12/06/2020 Dr. Colin. Hietpas is a 89 Caucasian male seen today for initial office consultation visit following hospital admission for confusion.  History is obtained from the patient and wife and review of electronic medical records and I personally reviewed available pertinent imaging films in PACS.  He has past medical history of right pontine stroke in 2013, coronary artery disease s/p CABG December 21, hypertension, hyperlipidemia, cardiomyopathy, left ventricular thrombus and pulmonary emboli.  Patient was admitted to [facility] in February 2022 for altered mental status for couple of weeks.  He had some odd behavior and was confused on multiple occasions.  CT scan of the head was showed suspected subacute left frontal and old right parietal infarcts.  MRI scan showed some T2 shine through but no acute infarct and dislocations.  2D echo showed ejection fraction of 40% with a kinesis of the inferior and inferior septal base with aneurysmal dilatation.  This was improved compared with previous echo from January 2022 when there was apical thrombus which was no longer seen.  EEG showed mild diffuse slowing suggestive of encephalopathy but no seizures.  LDL cholesterol 25 mg percent.  Hemoglobin A1c was 6.8.  Patient had been on Coumadin  which she had a tough time taking and was switched to Eliquis  which she is not tolerating much better without bruising or bleeding.  Continues to have memory loss and cognitive impairment which is unchanged.   Patient not been evaluated for reversible causes of cognitive impairment and not been tried on medication like Aricept  or Namenda  yet.  Patient had a remote history of a right pontine stroke in 2013 and had only mild speech difficulties which resolved completely.  He had no other strokes due to his recent cardiac surgery in December.  He had transient postop A. fib as well and a left ventricular thrombus on the echo which has since resolved.  There is no family history of Alzheimer's.  Patient has no history of seizures or significant head injury with loss of consciousness.  Patient's cognitive impairment is mostly related to short-term memory and remembering recent information.  He still mostly independent in activities of daily living and can attend to his own bodily needs.  He does get frustrated easily but there is no agitation, violent behavior, delusions or hallucinations.  He has not exhibited any unsafe behavior and does not wander off.  His balance is poor and he does use a cane and he fell once in the bathroom a month ago does not fall on a regular basis.  Update 03/13/2021 Dr. Rosemarie; He returns for follow-up after last visit 3 months ago.  He is accompanied by his wife.   They feel his memory is slightly worse.  He does get confused off and on at times.  He is still mostly independent with activities of daily living but he needs constant reminders and repeated reminders about recent staffing which he forgets.  He has not been driving.  Patient  has been complaining of getting tired easily and hence does not walk a lot.  Wife feels at times when he is tired he can drag his feet.  He has had no falls or injuries.  There have been no delusions or hallucinations noted.  He does get occasionally agitated but there has been no violent behavior.  He continues to live with his wife and daughter and have somebody around him all the time.  He did undergo lab work on 12/06/2020 which was significant for low vitamin  B12 levels of 153 otherwise TSH, homocystine and RPR were normal.  EEG on 12/31/2020 was normal.  Patient had a fall and was seen in the ER on 02/17/2021 and had a CT scan of the head which showed mild generalized atrophy and bilateral lower cortical infarcts with no acute abnormality.  On Mini-Mental status exam today he actually scored 27 out of 30 which is an improvement from last visit when he had scored 22/30.  Update 09/23/2021 Dr. Rosemarie: He returns for follow-up after last visit 6 months ago.  He is accompanied by his wife and daughter.  They feel he is doing about the same.  His memory and cognitive difficulties are unchanged.  He remains on Namenda  10 mg twice daily which is tolerating well without side effects.  Patient seems to sleep a lot.  Does not walk a lot for exercise.  He has not been regularly doing cognitively challenging activities.  He has not had any new health issues.  Remains on Eliquis  which is tolerating well without bleeding or bruising.  Blood pressures well controlled today it is 119/74.  Sugars are also under good control.  He is tolerating Crestor  well without muscle aches and pains.  He has no new complaints today.  On Mini-Mental status exam today scored 25/30 which is similar to 27/30 at last visit 6 months ago.  Family does not feel he has had any significant cognitive decline  Update 03/27/2022 JM: Patient returns for 76-month memory follow-up accompanied by his wife and daughter.   Patient believes memory has been stable but family believes memory has gradually declined since prior visit. Will misplace items, will forget where their home is, short term memory worsening, difficulty operating remote or cell phone at times. Sleeps throughout the night, can have occasional nocturia, frequent daytime napping, will get up to eat or go to the bathroom. He doesn't do any type of physical or mental activity, wife tries to encourage him but he refuses. Does use his CPAP nightly. Appetite  good.  Wife questions possible underlying depression that could be contributing.  Is scheduled next month for yearly physical with PCP.  Blood pressure not routinely monitored at home.  Glucose levels have been slightly elevated, past 7-day average 210, wife believes this is because he will forget that he just ate and then eat again.  No further concerns at this time  Update 09/25/2022 JM: Returns for 40-month follow-up. Patient believes his memory has been stable since prior visit. Per wife, believes memory has declined some since prior visit more so with short term memory. Reports he sleeps like a log, sleeps all the time per wife, day and night.  Remains sedentary, not interested in participating in any type of activity centers or local gyms.  Does not do any mentally stimulating activities.  Appetite is good. No behavioral concerns. At prior visit, wife mentioned concerns of possible depression, has noticed some improvement of anger/outbursts on sertraline  but believes he  is more sleepy since starting. Did have lab work last fall with PCP, unable to view via epic, per wife, iron levels were low and currently on supplement and also started on thyroid  medication. Is currently taking B12 for known B12 deficiency. Has not had repeat levels since.   Update 04/14/2023 JM: Patient returns for follow-up visit accompanied by his wife and daughter. Reports continued memory decline since the last visit. Difficulty with short term memory. Continues to sleep majority of the day despite sleeping well at night. Did try to stop sertraline  at prior visit due to concern of fatigue side effect but did not see any change therefore was restarted. Depression seems to be well controlled. Does not participate in any type of physical or mental activities.  Good appetite.  No behavioral concerns.  MMSE today 21/30 (prior 22/30). Wife is concerned that she feels his level of cognitive impairment is not well reflected on memory testing.  Does have fm hx of dementia in mother and both siblings.  Is scheduled to see PCP next month for annual physical.   Update 11/03/2023 JM: Patient returns for follow-up visit accompanied by his wife and daughter.  Family unable to say for certain if memory has declined or has remained stable since prior visit.  Wife does note that there has not been any improvement.  He has remained on memantine  and donepezil  without side effects.  Denies any behavioral symptoms.  Reports he sleeps well at night but does nap frequently during the day.  Reports good appetite.  Mood stable on sertraline .  Has initial evaluation with neuropsychologist Dr. Jackquline on 4/22.  Wife is eager to have testing completed as she feels this will help provide more answers, does note fm hx of Alzheimer's disease in his mother, sister and brother.  Ambulates outdoors with walking stick, no recent falls.  Reports he routinely follows with PCP.   Update 05/19/2024 JM: Patient returns for follow-up visit accompanied by his wife.  Reports overall cognition has been stable over the past 6 months.  Wife notes he can have good days and bad days.  He did complete neurocognitive evaluation back in April with Dr. Jackquline who noted significant impairment with low scores in nearly every cognitive domain and dementia currently at a moderate level of progression.  Suspected dementia multifactoral with contributions from cerebrovascular disease and possible Alzheimer's disease.  Wife would like to further evaluate with further imaging.  He remains on Aricept  and Namenda .  No behavioral concerns.  He was hospitalized back in June after VT arrest and underwent pacemaker placement.  He has noted increased fatigue since that time and recently started pulmonary rehab.  He routinely follows with cardiology.       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 s/p CABG in 06/2020 with LIMA-LAD, SVG-D1 and SVG-RCA   COVID-07 Dec 2019   Dementia [facility])    Depression    Essential hypertension    Hernia, umbilical    HOH (hard of hearing)    Hyperlipidemia    Ischemic cardiomyopathy    Ischemic necrosis of small bowel    Left ventricular mural thrombus    NSTEMI (non-ST elevated myocardial infarction) (  HCC)    OSA on CPAP    Pulmonary emboli [facility])    May 2021   Skin cancer    Type 2 diabetes mellitus (HCC)     Social History:  Social History   Socioeconomic History   Marital status: Married    Spouse name: [name]   Number of children: Not on file   Years of education: Not on file   Highest education level: Not on file  Occupational History   Not on file  Tobacco Use   Smoking status: Never   Smokeless tobacco: Never  Vaping Use   Vaping status: Never Used  Substance and Sexual Activity   Alcohol  use: No   Drug use: No   Sexual activity: Not on file  Other Topics Concern   Not on file  Social History Narrative   Lives with wife at Daughter's home   Right Handed   Drinks very little caffeine daily   Retired    Chief Executive Officer Drivers of Corporate Investment Banker Strain: Not on file  Food Insecurity: Low Risk  (02/22/2024)   Received from [facility]   Hunger Vital Sign    Within the past 12 months, you worried that your food would run out before you got money to buy more: Never true    Within the past 12 months, the food you bought just didn't last and you didn't have money to get more. : Never true  Transportation Needs: No Transportation Needs (02/22/2024)   Received from [facility]    In the past 12 months, has lack of reliable transportation kept you from medical appointments, meetings, work or from getting things needed for daily living? : No  Physical Activity: Not on  file  Stress: Not on file  Social Connections: Socially Integrated (01/06/2024)   Social Connection and Isolation Panel    Frequency of Communication with Friends and Family: More than three times a week    Frequency of Social Gatherings with Friends and Family: More than three times a week    Attends Religious Services: More than 4 times per year    Active Member of [employer] or Organizations: Yes    Attends Banker Meetings: More than 4 times per year    Marital Status: Married  Catering Manager Violence: Not At Risk (01/07/2024)   Humiliation, Afraid, Rape, and Kick questionnaire    Fear of Current or Ex-Partner: No    Emotionally Abused: No    Physically Abused: No    Sexually Abused: No    Medications:   Current Outpatient Medications on File Prior to Visit  Medication Sig Dispense Refill   amiodarone  (PACERONE ) 200 MG tablet 200 mg BID x 7 days, then 200 mg daily 40 tablet 3   apixaban  (ELIQUIS ) 5 MG TABS tablet TAKE 1 TABLET(5 MG) BY MOUTH TWICE DAILY 180 tablet 1   BD PEN NEEDLE NANO U/F 32G X 4 MM MISC 1 each by Other route daily.      Biotin -Vitamin C (HAIR SKIN NAILS GUMMIES PO) Take 2 tablets by mouth daily.     Continuous Glucose Sensor (FREESTYLE LIBRE 3 PLUS SENSOR) MISC by Does not apply route. Change sensor every 15 days.     Cyanocobalamin  (VITAMIN B-12) 5000 MCG SUBL Place 1 tablet under the tongue daily.     donepezil  (ARICEPT ) 10 MG tablet Take 1 tablet (10 mg total) by mouth at bedtime. 90 tablet 3   empagliflozin  ([name] ) 10 MG  TABS tablet Take 1 tablet (10 mg total) by mouth daily before breakfast. 90 tablet 1   fenofibrate  160 MG tablet Take 160 mg by mouth every morning.     Insulin  Aspart (NOVOLOG  FLEXPEN [place]) Inject 8-10 Units into the skin 3 (three) times daily before meals. Dose per sliding scale     insulin  degludec (TRESIBA FLEXTOUCH) 100 UNIT/ML FlexTouch Pen Inject 32 Units into the skin daily.     levothyroxine  (SYNTHROID ) 50 MCG tablet Take  50 mcg by mouth every morning.     losartan  (COZAAR ) 25 MG tablet TAKE 1/2 TABLET(12.5 MG) BY MOUTH DAILY 45 tablet 1   memantine  (NAMENDA ) 10 MG tablet Take 1 tablet (10 mg total) by mouth 2 (two) times daily. 180 tablet 3   metFORMIN  (GLUCOPHAGE ) 500 MG tablet Take 500 mg by mouth 2 (two) times daily with a meal.     Multiple Vitamin (MULTIVITAMIN WITH MINERALS) TABS tablet Take 1 tablet by mouth daily.     rosuvastatin  (CRESTOR ) 20 MG tablet TAKE 1 TABLET(20 MG) BY MOUTH DAILY 90 tablet 1   sertraline  (ZOLOFT ) 25 MG tablet Take 1 tablet (25 mg total) by mouth daily. 90 tablet 3   tamsulosin  (FLOMAX ) 0.4 MG CAPS capsule Take 0.4 mg by mouth at bedtime.     Vedolizumab  (ENTYVIO  IV) Inject into the vein every 28 (twenty-eight) days.     Ferrous Sulfate  (IRON PO) Take 1 tablet by mouth daily.     No current facility-administered medications on file prior to visit.    Allergies:   Allergies  Allergen Reactions   Atorvastatin Other (See Comments)    Muscle pain   Sitagliptin Other (See Comments)    Myalgias    Physical Exam Today's Vitals   05/19/24 1526  BP: (!) 140/54  Pulse: (!) 55  Weight: 178 lb 3.2 oz (80.8 kg)  Height: 5' 7 (1.702 m)   Body mass index is 27.91 kg/m.  General: well developed, well nourished very pleasant elderly Caucasian male, seated, in no evident distress  Neurologic Exam Mental Status: Awake and fully alert. Recent and remote memory diminished. Attention span, concentration and fund of knowledge poor with wife providing history.  Mood and affect appropriate. Cranial Nerves: Pupils equal, briskly reactive to light. Extraocular movements full without nystagmus. Visual fields full to confrontation. Hearing significantly diminished bilaterally. facial sensation intact. Face, tongue, palate moves normally and symmetrically.  Motor: Normal bulk and tone. Normal strength in all tested extremity muscles Sensory.: intact to touch , pinprick , position and  vibratory sensation.  Coordination: Rapid alternating movements normal in all extremities. Finger-to-nose and heel-to-shin performed accurately bilaterally. Gait and Station: Arises from chair with slight difficulty. Stance is stooped. Gait demonstrates normal stride length and only slight imbalance with use of walking stick.  Not able to heel, toe and tandem walk    Reflexes: 1+ and symmetric. Toes downgoing.      05/19/2024    3:29 PM 11/03/2023    1:38 PM 04/14/2023   11:08 AM  MMSE - Mini Mental State Exam  Orientation to time 5 3 3   Orientation to Place 4 5 5   Registration 3 3 3   Attention/ Calculation 1 2 1   Recall 3 1 3   Language- name 2 objects 2 2 2   Language- repeat 1 1 0  Language- follow 3 step command 3 2 3   Language- read & follow direction 1 1 0  Write a sentence 1 0 0  Copy design 1 0  1  Total score 25 20 21            ASSESSMENT/PLAN: 78 year old Caucasian male with cognitive deterioration and memory loss with onset around 06/2020 following cardiac surgery.  History of remote right pontine infarct in 2013 and suspect left frontal and right parietal infarcts following cardiac surgery in December 2021.  Vascular risk factors of coronary artery disease, left ventricular thrombus, perioperative atrial fibrillation, diabetes, hypertension, hyperlipidemia and age, s/p ICD after VT arrest 12/2023.     -Suspect cognitive impairment more vascular in etiology but possible underlying Alzheimer's due to extensive family history -will proceed with amyloid PET scan per wife request - ATN profile elevated P tau, normal beta amyloid ratio - Per neuropsychology, suspect primary etiology vascular but also possibility of Alzheimer's -MMSE 25/30 today, prior 20/30  -Continue Namenda  10 mg twice daily - refill provided - Continue donepezil  10 mg daily -Continue sertraline  25 mg daily for depression management -Discussed importance of increasing both physical and mental activities  as well as ensuring good sleep and healthy diet and management of vascular risk factors      Follow-up in 6 months or call earlier if needed     CC:  Shepard Ade, MD   Harlene Bogaert, AGNP-BC  [facility] [address] [place], [place] [address]  Phone [phone] Fax [phone] Note: This document was prepared with digital dictation and possible smart phrase technology. Any transcriptional errors that result from this process are unintentional.

## 2024-05-19 ENCOUNTER — Ambulatory Visit: Admitting: Adult Health

## 2024-05-19 ENCOUNTER — Encounter (HOSPITAL_COMMUNITY)

## 2024-05-19 ENCOUNTER — Encounter: Payer: Self-pay | Admitting: Adult Health

## 2024-05-19 VITALS — BP 140/54 | HR 55 | Ht 67.0 in | Wt 178.2 lb

## 2024-05-19 DIAGNOSIS — Z8673 Personal history of transient ischemic attack (TIA), and cerebral infarction without residual deficits: Secondary | ICD-10-CM | POA: Diagnosis not present

## 2024-05-19 DIAGNOSIS — F03B0 Unspecified dementia, moderate, without behavioral disturbance, psychotic disturbance, mood disturbance, and anxiety: Secondary | ICD-10-CM

## 2024-05-19 DIAGNOSIS — H52203 Unspecified astigmatism, bilateral: Secondary | ICD-10-CM | POA: Diagnosis not present

## 2024-05-19 DIAGNOSIS — E119 Type 2 diabetes mellitus without complications: Secondary | ICD-10-CM | POA: Diagnosis not present

## 2024-05-19 DIAGNOSIS — Z820 Family history of epilepsy and other diseases of the nervous system: Secondary | ICD-10-CM | POA: Diagnosis not present

## 2024-05-19 DIAGNOSIS — H2513 Age-related nuclear cataract, bilateral: Secondary | ICD-10-CM | POA: Diagnosis not present

## 2024-05-19 DIAGNOSIS — R413 Other amnesia: Secondary | ICD-10-CM

## 2024-05-19 NOTE — Patient Instructions (Signed)
 Your Plan:  Continue Aricept  and Namenda  at current dosages  You will be called to schedule an amyloid PET scan to further evaluate memory loss      Follow up in 6 months or call earlier if needed     Thank you for coming to see us  at [facility]. I hope we have been able to provide you high quality care today.  You may receive a patient satisfaction survey over the next few weeks. We would appreciate your feedback and comments so that we may continue to improve ourselves and the health of our patients.

## 2024-05-20 DIAGNOSIS — K50819 Crohn's disease of both small and large intestine with unspecified complications: Secondary | ICD-10-CM | POA: Diagnosis not present

## 2024-05-23 ENCOUNTER — Other Ambulatory Visit: Payer: Self-pay | Admitting: Pulmonary Disease

## 2024-05-24 ENCOUNTER — Encounter (HOSPITAL_COMMUNITY)

## 2024-05-25 ENCOUNTER — Encounter (HOSPITAL_COMMUNITY): Payer: Self-pay

## 2024-05-25 ENCOUNTER — Ambulatory Visit

## 2024-05-25 DIAGNOSIS — I469 Cardiac arrest, cause unspecified: Secondary | ICD-10-CM

## 2024-05-25 DIAGNOSIS — I5022 Chronic systolic (congestive) heart failure: Secondary | ICD-10-CM

## 2024-05-25 NOTE — Progress Notes (Signed)
 Pulmonary Individual Treatment Plan  Patient Details  Name: Patrick Miranda MRN: [id number] Date of Birth: 1946/02/04 Referring Provider:   Flowsheet Row PULMONARY REHAB OTHER RESP ORIENTATION from 04/25/2024 in [facility] CARDIAC REHABILITATION  Referring Provider Cindie Smalls MD    Initial Encounter Date:  Flowsheet Row PULMONARY REHAB OTHER RESP ORIENTATION from 04/25/2024 in [facility] CARDIAC REHABILITATION  Date 04/25/24    Visit Diagnosis: Chronic systolic HF (heart failure) (HCC)  Patient's Home Medications on Admission:  Current Outpatient Medications:    amiodarone  (PACERONE ) 200 MG tablet, 200 mg BID x 7 days, then 200 mg daily, Disp: 40 tablet, Rfl: 3   apixaban  (ELIQUIS ) 5 MG TABS tablet, TAKE 1 TABLET(5 MG) BY MOUTH TWICE DAILY, Disp: 180 tablet, Rfl: 1   BD PEN NEEDLE NANO U/F 32G X 4 MM MISC, 1 each by Other route daily. , Disp: , Rfl:    Biotin -Vitamin C (HAIR SKIN NAILS GUMMIES PO), Take 2 tablets by mouth daily., Disp: , Rfl:    Continuous Glucose Sensor (FREESTYLE LIBRE 3 PLUS SENSOR) MISC, by Does not apply route. Change sensor every 15 days., Disp: , Rfl:    Cyanocobalamin  (VITAMIN B-12) 5000 MCG SUBL, Place 1 tablet under the tongue daily., Disp: , Rfl:    donepezil  (ARICEPT ) 10 MG tablet, Take 1 tablet (10 mg total) by mouth at bedtime., Disp: 90 tablet, Rfl: 3   empagliflozin  ([name] ) 10 MG TABS tablet, Take 1 tablet (10 mg total) by mouth daily before breakfast., Disp: 90 tablet, Rfl: 1   fenofibrate  160 MG tablet, Take 160 mg by mouth every morning., Disp: , Rfl:    Ferrous Sulfate  (IRON PO), Take 1 tablet by mouth daily., Disp: , Rfl:    Insulin  Aspart (NOVOLOG  FLEXPEN [place]), Inject 8-10 Units into the skin 3 (three) times daily before meals. Dose per sliding scale, Disp: , Rfl:    insulin  degludec (TRESIBA FLEXTOUCH) 100 UNIT/ML FlexTouch Pen, Inject 32 Units into the skin daily., Disp: , Rfl:    levothyroxine  (SYNTHROID ) 50 MCG tablet, Take 50 mcg by  mouth every morning., Disp: , Rfl:    losartan  (COZAAR ) 25 MG tablet, TAKE 1/2 TABLET(12.5 MG) BY MOUTH DAILY, Disp: 45 tablet, Rfl: 1   memantine  (NAMENDA ) 10 MG tablet, Take 1 tablet (10 mg total) by mouth 2 (two) times daily., Disp: 180 tablet, Rfl: 3   metFORMIN  (GLUCOPHAGE ) 500 MG tablet, Take 500 mg by mouth 2 (two) times daily with a meal., Disp: , Rfl:    Multiple Vitamin (MULTIVITAMIN WITH MINERALS) TABS tablet, Take 1 tablet by mouth daily., Disp: , Rfl:    rosuvastatin  (CRESTOR ) 20 MG tablet, TAKE 1 TABLET(20 MG) BY MOUTH DAILY, Disp: 90 tablet, Rfl: 1   sertraline  (ZOLOFT ) 25 MG tablet, Take 1 tablet (25 mg total) by mouth daily., Disp: 90 tablet, Rfl: 3   tamsulosin  (FLOMAX ) 0.4 MG CAPS capsule, Take 0.4 mg by mouth at bedtime., Disp: , Rfl:    Vedolizumab  (ENTYVIO  IV), Inject into the vein every 28 (twenty-eight) days., Disp: , Rfl:   Past Medical History: Past Medical History:  Diagnosis Date   Arthritis    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 s/p CABG in 06/2020 with LIMA-LAD, SVG-D1 and SVG-RCA   COVID-07 Dec 2019   Dementia [facility])    Depression    Essential hypertension    Hernia, umbilical    HOH (hard  of hearing)    Hyperlipidemia    Ischemic cardiomyopathy    Ischemic necrosis of small bowel    Left ventricular mural thrombus    NSTEMI (non-ST elevated myocardial infarction) (HCC)    OSA on CPAP    Pulmonary emboli [facility])    May 2021   Skin cancer    Type 2 diabetes mellitus (HCC)     Tobacco Use: Social History   Tobacco Use  Smoking Status Never  Smokeless Tobacco Never    Labs: Review Flowsheet  More data exists      Latest Ref Rng & Units 07/14/2020 09/09/2020 05/08/2021 06/17/2023 01/06/2024  Labs for ITP Cardiac and Pulmonary Rehab  Cholestrol 0 - 200 mg/dL - 65  - - -  LDL (calc) 0 - 99 mg/dL - 25  - - -  HDL-C >59 mg/dL - 21  - - -  Trlycerides <150 mg/dL - 94  - - -  Hemoglobin  A1c 4.8 - 5.6 % - 6.8  8.8  8.1  7.8   Bicarbonate 20.0 - 28.0 mmol/L - - - - 21.9   TCO2 22 - 32 mmol/L - - - - 23   Acid-base deficit 0.0 - 2.0 mmol/L - - - - 4.0   O2 Saturation % 55.5  - - - 74      Pulmonary Assessment Scores:  Pulmonary Assessment Scores     Row Name 04/25/24 0852         ADL UCSD   ADL Phase Entry     SOB Score total 21     Rest 0     Walk 0     Stairs 3     Bath 0     Dress 0     Shop 3       CAT Score   CAT Score 14       mMRC Score   mMRC Score 3        UCSD: Self-administered rating of dyspnea associated with activities of daily living (ADLs) 6-point scale (0 = not at all to 5 = maximal or unable to do because of breathlessness)  Scoring Scores range from 0 to 120.  Minimally important difference is 5 units  CAT: CAT can identify the health impairment of COPD patients and is better correlated with disease progression.  CAT has a scoring range of zero to 40. The CAT score is classified into four groups of low (less than 10), medium (10 - 20), high (21-30) and very high (31-40) based on the impact level of disease on health status. A CAT score over 10 suggests significant symptoms.  A worsening CAT score could be explained by an exacerbation, poor medication adherence, poor inhaler technique, or progression of COPD or comorbid conditions.  CAT MCID is 2 points  mMRC: mMRC (Modified Medical Research Council) Dyspnea Scale is used to assess the degree of baseline functional disability in patients of respiratory disease due to dyspnea. No minimal important difference is established. A decrease in score of 1 point or greater is considered a positive change.   Pulmonary Function Assessment:   Exercise Target Goals: Exercise Program Goal: Individual exercise prescription set using results from initial 6 min walk test and THRR while considering  patient's activity barriers and safety.   Exercise Prescription Goal: Initial exercise  prescription builds to 30-45 minutes a day of aerobic activity, 2-3 days per week.  Home exercise guidelines will be given to patient during program as part of  exercise prescription that the participant will acknowledge.  Education: Aerobic Exercise: - Group verbal and visual presentation on the components of exercise prescription. Introduces F.I.T.T principle from ACSM for exercise prescriptions.  Reviews F.I.T.T. principles of aerobic exercise including progression. Written material provided at class time.   Education: Resistance Exercise: - Group verbal and visual presentation on the components of exercise prescription. Introduces F.I.T.T principle from ACSM for exercise prescriptions  Reviews F.I.T.T. principles of resistance exercise including progression. Written material provided at class time.    Education: Exercise & Equipment Safety: - Individual verbal instruction and demonstration of equipment use and safety with use of the equipment.   Education: Exercise Physiology & General Exercise Guidelines: - Group verbal and written instruction with models to review the exercise physiology of the cardiovascular system and associated critical values. Provides general exercise guidelines with specific guidelines to those with heart or lung disease.  Flowsheet Row PULMONARY REHAB OTHER RESPIRATORY from 05/12/2024 in [facility] CARDIAC REHABILITATION  Date 05/12/24  Educator [facility]  Instruction Review Code 1- Verbalizes Understanding    Education: Flexibility, Balance, Mind/Body Relaxation: - Group verbal and visual presentation with interactive activity on the components of exercise prescription. Introduces F.I.T.T principle from ACSM for exercise prescriptions. Reviews F.I.T.T. principles of flexibility and balance exercise training including progression. Also discusses the mind body connection.  Reviews various relaxation techniques to help reduce and manage stress (i.e. Deep breathing,  progressive muscle relaxation, and visualization). Balance handout provided to take home. Written material provided at class time.   Activity Barriers & Risk Stratification:  Activity Barriers & Cardiac Risk Stratification - 04/25/24 0950       Activity Barriers & Cardiac Risk Stratification   Activity Barriers Assistive Device;Deconditioning;Balance Concerns;Arthritis;Left Knee Replacement;Right Knee Replacement;Shortness of Breath;Decreased Ventricular Function;Muscular Weakness    Cardiac Risk Stratification High          6 Minute Walk:  6 Minute Walk     Row Name 04/25/24 0947         6 Minute Walk   Phase Initial     Distance 895 feet     Walk Time 6 minutes     # of Rest Breaks 0     MPH 1.69     METS 1.74     RPE 13     Perceived Dyspnea  1     VO2 Peak 6.08     Symptoms No     Resting HR 63 bpm     Resting BP 116/60     Resting Oxygen  Saturation  96 %     Exercise Oxygen  Saturation  during 6 min walk 93 %     Max Ex. HR 81 bpm     Max Ex. BP 144/66     2 Minute Post BP 134/70       Interval HR   1 Minute HR 86     2 Minute HR 86     3 Minute HR 90     4 Minute HR 92     5 Minute HR 91     6 Minute HR 90     2 Minute Post HR 69     Interval Heart Rate? Yes       Interval Oxygen    Interval Oxygen ? Yes     Baseline Oxygen  Saturation % 96 %     1 Minute Oxygen  Saturation % 95 %     1 Minute Liters of Oxygen  0 L  Room Air  2 Minute Oxygen  Saturation % 93 %     2 Minute Liters of Oxygen  0 L     3 Minute Oxygen  Saturation % 94 %     3 Minute Liters of Oxygen  0 L     4 Minute Oxygen  Saturation % 94 %     4 Minute Liters of Oxygen  0 L     5 Minute Oxygen  Saturation % 95 %     5 Minute Liters of Oxygen  0 L     6 Minute Oxygen  Saturation % 95 %     6 Minute Liters of Oxygen  0 L     2 Minute Post Oxygen  Saturation % 96 %     2 Minute Post Liters of Oxygen  0 L       Oxygen  Initial Assessment:  Oxygen  Initial Assessment - 04/22/24 1428       Home  Oxygen    Home Oxygen  Device None    Sleep Oxygen  Prescription CPAP;None      Intervention   Short Term Goals To learn and understand importance of monitoring SPO2 with pulse oximeter and demonstrate accurate use of the pulse oximeter.;To learn and understand importance of maintaining oxygen  saturations>88%;To learn and demonstrate proper pursed lip breathing techniques or other breathing techniques. ;To learn and demonstrate proper use of respiratory medications    Long  Term Goals Verbalizes importance of monitoring SPO2 with pulse oximeter and return demonstration;Maintenance of O2 saturations>88%;Exhibits proper breathing techniques, such as pursed lip breathing or other method taught during program session;Compliance with respiratory medication;Demonstrates proper use of MDI's          Oxygen  Re-Evaluation:   Oxygen  Discharge (Final Oxygen  Re-Evaluation):   Initial Exercise Prescription:  Initial Exercise Prescription - 04/25/24 0900       Date of Initial Exercise RX and Referring Provider   Date 04/25/24    Referring Provider Cindie Smalls MD      Oxygen    Maintain Oxygen  Saturation 88% or higher      NuStep   Level 1    SPM 80    Minutes 15    METs 1.7      REL-XR   Level 1    Speed 50    Minutes 15    METs 1.7      Prescription Details   Frequency (times per week) 2    Duration Progress to 30 minutes of continuous aerobic without signs/symptoms of physical distress      Intensity   THRR 40-80% of Max Heartrate 94-125    Ratings of Perceived Exertion 11-13    Perceived Dyspnea 0-4      Progression   Progression Continue to progress workloads to maintain intensity without signs/symptoms of physical distress.      Resistance Training   Training Prescription Yes    Weight 3 lb    Reps 10-15          Perform Capillary Blood Glucose checks as needed.  Exercise Prescription Changes:   Exercise Prescription Changes     Row Name 04/25/24 0900              Response to Exercise   Blood Pressure (Admit) 116/60       Blood Pressure (Exercise) 144/66       Blood Pressure (Exit) 132/60       Heart Rate (Admit) 63 bpm       Heart Rate (Exercise) 92 bpm       Heart Rate (Exit) 62 bpm  Oxygen  Saturation (Admit) 96 %       Oxygen  Saturation (Exercise) 93 %       Oxygen  Saturation (Exit) 97 %       Rating of Perceived Exertion (Exercise) 13       Perceived Dyspnea (Exercise) 1       Symptoms none       Comments walk test results          Exercise Comments:   Exercise Comments     Row Name 04/26/24 1453           Exercise Comments First full day of exercise!  Patient was oriented to gym and equipment including functions, settings, policies, and procedures.  Patient's individual exercise prescription and treatment plan were reviewed.  All starting workloads were established based on the results of the 6 minute walk test done at initial orientation visit.  The plan for exercise progression was also introduced and progression will be customized based on patient's performance and goals.          Exercise Goals and Review:   Exercise Goals     Row Name 04/25/24 [phone]             Exercise Goals   Increase Physical Activity Yes       Intervention Provide advice, education, support and counseling about physical activity/exercise needs.;Develop an individualized exercise prescription for aerobic and resistive training based on initial evaluation findings, risk stratification, comorbidities and participant's personal goals.       Expected Outcomes Short Term: Attend rehab on a regular basis to increase amount of physical activity.;Long Term: Add in home exercise to make exercise part of routine and to increase amount of physical activity.;Long Term: Exercising regularly at least 3-5 days a week.       Increase Strength and Stamina Yes       Intervention Provide advice, education, support and counseling about physical  activity/exercise needs.;Develop an individualized exercise prescription for aerobic and resistive training based on initial evaluation findings, risk stratification, comorbidities and participant's personal goals.       Expected Outcomes Short Term: Increase workloads from initial exercise prescription for resistance, speed, and METs.;Short Term: Perform resistance training exercises routinely during rehab and add in resistance training at home;Long Term: Improve cardiorespiratory fitness, muscular endurance and strength as measured by increased METs and functional capacity ( )       Able to understand and use rate of perceived exertion (RPE) scale Yes       Intervention Provide education and explanation on how to use RPE scale       Expected Outcomes Short Term: Able to use RPE daily in rehab to express subjective intensity level;Long Term:  Able to use RPE to guide intensity level when exercising independently       Able to understand and use Dyspnea scale Yes       Intervention Provide education and explanation on how to use Dyspnea scale       Expected Outcomes Short Term: Able to use Dyspnea scale daily in rehab to express subjective sense of shortness of breath during exertion;Long Term: Able to use Dyspnea scale to guide intensity level when exercising independently       Knowledge and understanding of Target Heart Rate Range (THRR) Yes       Intervention Provide education and explanation of THRR including how the numbers were predicted and where they are located for reference       Expected Outcomes Short  Term: Able to state/look up THRR;Short Term: Able to use daily as guideline for intensity in rehab;Long Term: Able to use THRR to govern intensity when exercising independently       Able to check pulse independently Yes       Intervention Provide education and demonstration on how to check pulse in carotid and radial arteries.;Review the importance of being able to check your own pulse for  safety during independent exercise       Expected Outcomes Short Term: Able to explain why pulse checking is important during independent exercise;Long Term: Able to check pulse independently and accurately       Understanding of Exercise Prescription Yes       Intervention Provide education, explanation, and written materials on patient's individual exercise prescription       Expected Outcomes Short Term: Able to explain program exercise prescription;Long Term: Able to explain home exercise prescription to exercise independently          Exercise Goals Re-Evaluation :  Exercise Goals Re-Evaluation     Row Name 04/26/24 1453             Exercise Goal Re-Evaluation   Exercise Goals Review Increase Physical Activity;Able to understand and use Dyspnea scale;Increase Strength and Stamina;Able to check pulse independently;Able to understand and use rate of perceived exertion (RPE) scale;Knowledge and understanding of Target Heart Rate Range (THRR);Understanding of Exercise Prescription       Comments Reviewed RPE and dyspnea scale, THR and program prescription with pt today.  Pt voiced understanding and was given a copy of goals to take home.       Expected Outcomes Short: Use RPE daily to regulate intensity.  Long: Follow program prescription in THR.          Discharge Exercise Prescription (Final Exercise Prescription Changes):  Exercise Prescription Changes - 04/25/24 0900       Response to Exercise   Blood Pressure (Admit) 116/60    Blood Pressure (Exercise) 144/66    Blood Pressure (Exit) 132/60    Heart Rate (Admit) 63 bpm    Heart Rate (Exercise) 92 bpm    Heart Rate (Exit) 62 bpm    Oxygen  Saturation (Admit) 96 %    Oxygen  Saturation (Exercise) 93 %    Oxygen  Saturation (Exit) 97 %    Rating of Perceived Exertion (Exercise) 13    Perceived Dyspnea (Exercise) 1    Symptoms none    Comments walk test results          Nutrition:  Target Goals: Understanding of  nutrition guidelines, daily intake of sodium 1500mg , cholesterol 200mg , calories 30% from fat and 7% or less from saturated fats, daily to have 5 or more servings of fruits and vegetables.  Education: Nutrition 1 -Group instruction provided by verbal, written material, interactive activities, discussions, models, and posters to present general guidelines for heart healthy nutrition including macronutrients, label reading, and promoting whole foods over processed counterparts. Education serves as pensions consultant of discussion of heart healthy eating for all. Written material provided at class time.     Education: Nutrition 2 -Group instruction provided by verbal, written material, interactive activities, discussions, models, and posters to present general guidelines for heart healthy nutrition including sodium, cholesterol, and saturated fat. Providing guidance of habit forming to improve blood pressure, cholesterol, and body weight. Written material provided at class time.     Biometrics:  Pre Biometrics - 04/25/24 0954       Pre Biometrics  Height 5' 8 (1.727 m)    Weight 179 lb 14.4 oz (81.6 kg)    Waist Circumference 36 inches    Hip Circumference 39 inches    Waist to Hip Ratio 0.92 %    BMI (Calculated) 27.36    Grip Strength 23.2 kg    Single Leg Stand 2.1 seconds           Nutrition Therapy Plan and Nutrition Goals:   Nutrition Assessments:  MEDIFICTS Score Key: >=70 Need to make dietary changes  40-70 Heart Healthy Diet <= 40 Therapeutic Level Cholesterol Diet  Flowsheet Row PULMONARY VIRTUAL BASED CARE from 04/22/2024 in [facility] CARDIAC REHABILITATION  Picture Your Plate Total Score on Admission 50   Picture Your Plate Scores: <59 Unhealthy dietary pattern with much room for improvement. 41-50 Dietary pattern unlikely to meet recommendations for good health and room for improvement. 51-60 More healthful dietary pattern, with some room for improvement.  >60  Healthy dietary pattern, although there may be some specific behaviors that could be improved.   Nutrition Goals Re-Evaluation:   Nutrition Goals Discharge (Final Nutrition Goals Re-Evaluation):   Psychosocial: Target Goals: Acknowledge presence or absence of significant depression and/or stress, maximize coping skills, provide positive support system. Participant is able to verbalize types and ability to use techniques and skills needed for reducing stress and depression.   Education: Stress, Anxiety, and Depression - Group verbal and visual presentation to define topics covered.  Reviews how body is impacted by stress, anxiety, and depression.  Also discusses healthy ways to reduce stress and to treat/manage anxiety and depression.  Written material provided at class time.   Education: Sleep Hygiene -Provides group verbal and written instruction about how sleep can affect your health.  Define sleep hygiene, discuss sleep cycles and impact of sleep habits. Review good sleep hygiene tips.    Initial Review & Psychosocial Screening:  Initial Psych Review & Screening - 04/22/24 1432       Initial Review   Current issues with Current Psychotropic Meds;Current Sleep Concerns;Current Depression      Family Dynamics   Good Support System? Yes    Comments Patient's wife and daughter support him.      Barriers   Psychosocial barriers to participate in program The patient should benefit from training in stress management and relaxation.;There are no identifiable barriers or psychosocial needs.      Screening Interventions   Interventions Encouraged to exercise;To provide support and resources with identified psychosocial needs;Provide feedback about the scores to participant    Expected Outcomes Short Term goal: Utilizing psychosocial counselor, staff and physician to assist with identification of specific Stressors or current issues interfering with healing process. Setting desired goal  for each stressor or current issue identified.;Long Term Goal: Stressors or current issues are controlled or eliminated.;Short Term goal: Identification and review with participant of any Quality of Life or Depression concerns found by scoring the questionnaire.;Long Term goal: The participant improves quality of Life and PHQ9 Scores as seen by post scores and/or verbalization of changes          Quality of Life Scores:  Scores of 19 and below usually indicate a poorer quality of life in these areas.  A difference of  2-3 points is a clinically meaningful difference.  A difference of 2-3 points in the total score of the Quality of Life Index has been associated with significant improvement in overall quality of life, self-image, physical symptoms, and general health in studies  assessing change in quality of life.  PHQ-9: Review Flowsheet       04/25/2024 01/19/2020 12/20/2019  Depression screen PHQ 2/9  Decreased Interest 3 0 0  Down, Depressed, Hopeless 2 0 1  PHQ - 2 Score 5 0 1  Altered sleeping 3 - -  Tired, decreased energy 3 - -  Change in appetite 3 - -  Feeling bad or failure about yourself  0 - -  Trouble concentrating 3 - -  Moving slowly or fidgety/restless 0 - -  Suicidal thoughts 0 - -  PHQ-9 Score 17 - -  Difficult doing work/chores Very difficult - -   Interpretation of Total Score  Total Score Depression Severity:  1-4 = Minimal depression, 5-9 = Mild depression, 10-14 = Moderate depression, 15-19 = Moderately severe depression, 20-27 = Severe depression   Psychosocial Evaluation and Intervention:  Psychosocial Evaluation - 04/22/24 1432       Psychosocial Evaluation & Interventions   Interventions Relaxation education;Encouraged to exercise with the program and follow exercise prescription;Stress management education    Comments Patient was referred to pulmonary rehab with chronic systolic HF. His wife and daughter answered questions for virtual call. He has mild  dementia and is being treated with Aricept . He also has some depression treated with Zoloft . He and his wife live with their daughter. His wife says he sleeps 90% of the time. She says he is able to follow directions but may need repeating at times. Her goals for him is to get him moving; to increase his activity level and that he will sleep less. There are no barriers identified for him to complete the program.    Expected Outcomes Short Term: Patient will start the program and attend consistently. Long Term: Patient will complete the program meeting personal goals.    Continue Psychosocial Services  Follow up required by staff          Psychosocial Re-Evaluation:   Psychosocial Discharge (Final Psychosocial Re-Evaluation):   Education: Education Goals: Education classes will be provided on a weekly basis, covering required topics. Participant will state understanding/return demonstration of topics presented.  Learning Barriers/Preferences:  Learning Barriers/Preferences - 04/22/24 1428       Learning Barriers/Preferences   Learning Barriers Hearing   Patient has dementia.   Learning Preferences Skilled Demonstration          General Pulmonary Education Topics:  Infection Prevention: - Provides verbal and written material to individual with discussion of infection control including proper hand washing and proper equipment cleaning during exercise session.   Falls Prevention: - Provides verbal and written material to individual with discussion of falls prevention and safety.   Chronic Lung Disease Review: - Group verbal instruction with posters, models, PowerPoint presentations and videos,  to review new updates, new respiratory medications, new advancements in procedures and treatments. Providing information on websites and 800 numbers for continued self-education. Includes information about supplement oxygen , available portable oxygen  systems, continuous and intermittent  flow rates, oxygen  safety, concentrators, and Medicare reimbursement for oxygen . Explanation of Pulmonary Drugs, including class, frequency, complications, importance of spacers, rinsing mouth after steroid MDI's, and proper cleaning methods for nebulizers. Review of basic lung anatomy and physiology related to function, structure, and complications of lung disease. Review of risk factors. Discussion about methods for diagnosing sleep apnea and types of masks and machines for OSA. Includes a review of the use of types of environmental controls: home humidity, furnaces, filters, dust mite/pet prevention, HEPA vacuums. Discussion about  weather changes, air quality and the benefits of nasal washing. Instruction on Warning signs, infection symptoms, calling MD promptly, preventive modes, and value of vaccinations. Review of effective airway clearance, coughing and/or vibration techniques. Emphasizing that all should Create an Action Plan. Written material provided at class time.   AED/CPR: - Group verbal and written instruction with the use of models to demonstrate the basic use of the AED with the basic ABC's of resuscitation.    Tests and Procedures:  - Group verbal and visual presentation and models provide information about basic cardiac anatomy and function. Reviews the testing methods done to diagnose heart disease and the outcomes of the test results. Describes the treatment choices: Medical Management, Angioplasty, or Coronary Bypass Surgery for treating various heart conditions including Myocardial Infarction, Angina, Valve Disease, and Cardiac Arrhythmias.  Written material provided at class time.   Medication Safety: - Group verbal and visual instruction to review commonly prescribed medications for heart and lung disease. Reviews the medication, class of the drug, and side effects. Includes the steps to properly store meds and maintain the prescription regimen.  Written material given at  graduation.   Other: -Provides group and verbal instruction on various topics (see comments)   Knowledge Questionnaire Score:  Knowledge Questionnaire Score - 04/22/24 1430       Knowledge Questionnaire Score   Pre Score 13/18           Core Components/Risk Factors/Patient Goals at Admission:  Personal Goals and Risk Factors at Admission - 04/25/24 0955       Core Components/Risk Factors/Patient Goals on Admission    Weight Management Yes;Weight Maintenance    Intervention Weight Management: Develop a combined nutrition and exercise program designed to reach desired caloric intake, while maintaining appropriate intake of nutrient and fiber, sodium and fats, and appropriate energy expenditure required for the weight goal.;Weight Management: Provide education and appropriate resources to help participant work on and attain dietary goals.    Admit Weight 179 lb 14.4 oz (81.6 kg)    Goal Weight: Short Term 174 lb (78.9 kg)    Goal Weight: Long Term 170 lb (77.1 kg)    Expected Outcomes Short Term: Continue to assess and modify interventions until short term weight is achieved;Long Term: Adherence to nutrition and physical activity/exercise program aimed toward attainment of established weight goal;Weight Maintenance: Understanding of the daily nutrition guidelines, which includes 25-35% calories from fat, 7% or less cal from saturated fats, less than 200mg  cholesterol, less than 1.5gm of sodium, & 5 or more servings of fruits and vegetables daily;Weight Loss: Understanding of general recommendations for a balanced deficit meal plan, which promotes 1-2 lb weight loss per week and includes a negative energy balance of [phone] kcal/d;Understanding recommendations for meals to include 15-35% energy as protein, 25-35% energy from fat, 35-60% energy from carbohydrates, less than 200mg  of dietary cholesterol, 20-35 gm of total fiber daily;Understanding of distribution of calorie intake throughout  the day with the consumption of 4-5 meals/snacks    Improve shortness of breath with ADL's Yes    Intervention Provide education, individualized exercise plan and daily activity instruction to help decrease symptoms of SOB with activities of daily living.    Expected Outcomes Short Term: Improve cardiorespiratory fitness to achieve a reduction of symptoms when performing ADLs;Long Term: Be able to perform more ADLs without symptoms or delay the onset of symptoms    Diabetes Yes    Intervention Provide education about signs/symptoms and action to take for hypo/hyperglycemia.;Provide  education about proper nutrition, including hydration, and aerobic/resistive exercise prescription along with prescribed medications to achieve blood glucose in normal ranges: Fasting glucose 65-99 mg/dL    Expected Outcomes Short Term: Participant verbalizes understanding of the signs/symptoms and immediate care of hyper/hypoglycemia, proper foot care and importance of medication, aerobic/resistive exercise and nutrition plan for blood glucose control.;Long Term: Attainment of HbA1C < 7%.    Heart Failure Yes    Intervention Provide a combined exercise and nutrition program that is supplemented with education, support and counseling about heart failure. Directed toward relieving symptoms such as shortness of breath, decreased exercise tolerance, and extremity edema.    Expected Outcomes Improve functional capacity of life;Short term: Attendance in program 2-3 days a week with increased exercise capacity. Reported lower sodium intake. Reported increased fruit and vegetable intake. Reports medication compliance.;Short term: Daily weights obtained and reported for increase. Utilizing diuretic protocols set by physician.;Long term: Adoption of self-care skills and reduction of barriers for early signs and symptoms recognition and intervention leading to self-care maintenance.    Hypertension Yes    Intervention Provide education  on lifestyle modifcations including regular physical activity/exercise, weight management, moderate sodium restriction and increased consumption of fresh fruit, vegetables, and low fat dairy, alcohol  moderation, and smoking cessation.;Monitor prescription use compliance.    Expected Outcomes Short Term: Continued assessment and intervention until BP is < 140/62mm HG in hypertensive participants. < 130/29mm HG in hypertensive participants with diabetes, heart failure or chronic kidney disease.;Long Term: Maintenance of blood pressure at goal levels.    Lipids Yes    Intervention Provide education and support for participant on nutrition & aerobic/resistive exercise along with prescribed medications to achieve LDL 70mg , HDL >40mg .    Expected Outcomes Short Term: Participant states understanding of desired cholesterol values and is compliant with medications prescribed. Participant is following exercise prescription and nutrition guidelines.;Long Term: Cholesterol controlled with medications as prescribed, with individualized exercise RX and with personalized nutrition plan. Value goals: LDL < 70mg , HDL > 40 mg.          Education:Diabetes - Individual verbal and written instruction to review signs/symptoms of diabetes, desired ranges of glucose level fasting, after meals and with exercise. Acknowledge that pre and post exercise glucose checks will be done for 3 sessions at entry of program.   Know Your Numbers and Heart Failure: - Group verbal and visual instruction to discuss disease risk factors for cardiac and pulmonary disease and treatment options.  Reviews associated critical values for Overweight/Obesity, Hypertension, Cholesterol, and Diabetes.  Discusses basics of heart failure: signs/symptoms and treatments.  Introduces Heart Failure Zone chart for action plan for heart failure. Written material provided at class time.   Core Components/Risk Factors/Patient Goals Review:    Core  Components/Risk Factors/Patient Goals at Discharge (Final Review):    ITP Comments:  ITP Comments     Row Name 04/22/24 1441 04/26/24 1453 04/27/24 1815 05/25/24 0922     ITP Comments Virtual orientation visit completed for pulmonary rehab with chronic systolic HF. On-site orientation visit scheduled for 04/25/24 at 0800. First full day of exercise!  Patient was oriented to gym and equipment including functions, settings, policies, and procedures.  Patient's individual exercise prescription and treatment plan were reviewed.  All starting workloads were established based on the results of the 6 minute walk test done at initial orientation visit.  The plan for exercise progression was also introduced and progression will be customized based on patient's performance and goals. 30 day review  completed. ITP sent to Dr.Jehanzeb Memon, Medical Director of  Pulmonary Rehab. Continue with ITP unless changes are made by physician.  He only started to exercise yesterday. 30 day review completed. ITP sent to Dr.Jehanzeb Memon, Medical Director of  Pulmonary Rehab. Continue with ITP unless changes are made by physician.  New to the program.       Comments: 30 day review

## 2024-05-26 ENCOUNTER — Encounter (HOSPITAL_COMMUNITY)

## 2024-05-26 LAB — CUP PACEART REMOTE DEVICE CHECK
Battery Remaining Longevity: 169 mo
Battery Voltage: 3.13 V
Brady Statistic RA Percent Paced: INVALID
Brady Statistic RV Percent Paced: 0.15 %
Date Time Interrogation Session: 20251105014750
HighPow Impedance: 66 Ohm
Implantable Lead Connection Status: 753985
Implantable Lead Implant Date: 20250623
Implantable Lead Location: 753860
Implantable Pulse Generator Implant Date: 20250623
Lead Channel Impedance Value: 399 Ohm
Lead Channel Impedance Value: 456 Ohm
Lead Channel Pacing Threshold Amplitude: 0.5 V
Lead Channel Pacing Threshold Pulse Width: 0.4 ms
Lead Channel Sensing Intrinsic Amplitude: 12 mV
Lead Channel Setting Pacing Amplitude: 2 V
Lead Channel Setting Pacing Pulse Width: 0.4 ms
Lead Channel Setting Sensing Sensitivity: 0.3 mV
Zone Setting Status: 755011

## 2024-05-27 ENCOUNTER — Telehealth (HOSPITAL_COMMUNITY): Payer: Self-pay

## 2024-05-27 NOTE — Progress Notes (Signed)
 Remote ICD Transmission

## 2024-05-27 NOTE — Telephone Encounter (Signed)
 Patient's wife called and said she had knee surgery and will not be able to drive for 6 to 8 weeks. She request we put [facility] on hold until January 2026. All sessions for the rest of 2025 will be rescheduled.

## 2024-05-29 ENCOUNTER — Ambulatory Visit: Payer: Self-pay | Admitting: Student in an Organized Health Care Education/Training Program

## 2024-05-31 ENCOUNTER — Encounter (HOSPITAL_COMMUNITY)

## 2024-06-02 ENCOUNTER — Encounter (HOSPITAL_COMMUNITY)

## 2024-06-06 DIAGNOSIS — I509 Heart failure, unspecified: Secondary | ICD-10-CM | POA: Diagnosis not present

## 2024-06-06 DIAGNOSIS — F325 Major depressive disorder, single episode, in full remission: Secondary | ICD-10-CM | POA: Diagnosis not present

## 2024-06-06 DIAGNOSIS — I69319 Unspecified symptoms and signs involving cognitive functions following cerebral infarction: Secondary | ICD-10-CM | POA: Diagnosis not present

## 2024-06-06 DIAGNOSIS — I472 Ventricular tachycardia, unspecified: Secondary | ICD-10-CM | POA: Diagnosis not present

## 2024-06-06 DIAGNOSIS — M1990 Unspecified osteoarthritis, unspecified site: Secondary | ICD-10-CM | POA: Diagnosis not present

## 2024-06-06 DIAGNOSIS — R001 Bradycardia, unspecified: Secondary | ICD-10-CM | POA: Diagnosis not present

## 2024-06-06 DIAGNOSIS — Z79890 Hormone replacement therapy (postmenopausal): Secondary | ICD-10-CM | POA: Diagnosis not present

## 2024-06-06 DIAGNOSIS — N1831 Chronic kidney disease, stage 3a: Secondary | ICD-10-CM | POA: Diagnosis not present

## 2024-06-06 DIAGNOSIS — N3941 Urge incontinence: Secondary | ICD-10-CM | POA: Diagnosis not present

## 2024-06-06 DIAGNOSIS — I4891 Unspecified atrial fibrillation: Secondary | ICD-10-CM | POA: Diagnosis not present

## 2024-06-06 DIAGNOSIS — I255 Ischemic cardiomyopathy: Secondary | ICD-10-CM | POA: Diagnosis not present

## 2024-06-06 DIAGNOSIS — I130 Hypertensive heart and chronic kidney disease with heart failure and stage 1 through stage 4 chronic kidney disease, or unspecified chronic kidney disease: Secondary | ICD-10-CM | POA: Diagnosis not present

## 2024-06-06 DIAGNOSIS — E039 Hypothyroidism, unspecified: Secondary | ICD-10-CM | POA: Diagnosis not present

## 2024-06-06 DIAGNOSIS — R011 Cardiac murmur, unspecified: Secondary | ICD-10-CM | POA: Diagnosis not present

## 2024-06-06 DIAGNOSIS — I252 Old myocardial infarction: Secondary | ICD-10-CM | POA: Diagnosis not present

## 2024-06-06 DIAGNOSIS — I2510 Atherosclerotic heart disease of native coronary artery without angina pectoris: Secondary | ICD-10-CM | POA: Diagnosis not present

## 2024-06-06 DIAGNOSIS — Z7901 Long term (current) use of anticoagulants: Secondary | ICD-10-CM | POA: Diagnosis not present

## 2024-06-06 DIAGNOSIS — D6869 Other thrombophilia: Secondary | ICD-10-CM | POA: Diagnosis not present

## 2024-06-06 DIAGNOSIS — E1159 Type 2 diabetes mellitus with other circulatory complications: Secondary | ICD-10-CM | POA: Diagnosis not present

## 2024-06-06 DIAGNOSIS — N529 Male erectile dysfunction, unspecified: Secondary | ICD-10-CM | POA: Diagnosis not present

## 2024-06-06 DIAGNOSIS — Z794 Long term (current) use of insulin: Secondary | ICD-10-CM | POA: Diagnosis not present

## 2024-06-06 DIAGNOSIS — G309 Alzheimer's disease, unspecified: Secondary | ICD-10-CM | POA: Diagnosis not present

## 2024-06-06 DIAGNOSIS — N400 Enlarged prostate without lower urinary tract symptoms: Secondary | ICD-10-CM | POA: Diagnosis not present

## 2024-06-06 DIAGNOSIS — Z818 Family history of other mental and behavioral disorders: Secondary | ICD-10-CM | POA: Diagnosis not present

## 2024-06-06 DIAGNOSIS — Z8616 Personal history of COVID-19: Secondary | ICD-10-CM | POA: Diagnosis not present

## 2024-06-06 DIAGNOSIS — E1122 Type 2 diabetes mellitus with diabetic chronic kidney disease: Secondary | ICD-10-CM | POA: Diagnosis not present

## 2024-06-06 DIAGNOSIS — Z95810 Presence of automatic (implantable) cardiac defibrillator: Secondary | ICD-10-CM | POA: Diagnosis not present

## 2024-06-07 ENCOUNTER — Encounter (HOSPITAL_COMMUNITY)

## 2024-06-07 ENCOUNTER — Telehealth: Payer: Self-pay | Admitting: Adult Health

## 2024-06-07 NOTE — Telephone Encounter (Signed)
 His insurance denied the pet scan: This scan looks for Alzheimer's disease. We need notes from your doctor showing one of these two things.  First, you have mild memory problems or mild dementia, and your doctor wants to check for Alzheimer's.  Second, you already have Alzheimer's. Your doctor is thinking about starting a special medicine that removes  harmful proteins from your brain. Alzheimer's disease is when your memory and thinking slowly get worse. It  can make it hard to remember people, places, or how to do everyday things.   I can print off the appeal information if you'd like and we have 65 days to submit it.

## 2024-06-08 NOTE — Telephone Encounter (Signed)
 Can you please advise wife? Reason for scan was to further evaluate for suspected AD per wife request but is not a candidate for anti amyloid therapy due to current use of Eliquis  so current treatment plan would not change. Thank you.

## 2024-06-09 ENCOUNTER — Encounter (HOSPITAL_COMMUNITY)

## 2024-06-09 NOTE — Telephone Encounter (Signed)
 I called and relayed message we were trying to reach by mychart but since had not read this wanted to relay by phone.  I did relay per below.  She verbalized understanding.  She will call back as needed. They will keep upcoming appt.

## 2024-06-10 NOTE — Telephone Encounter (Signed)
 Spoke with patients wife.  They did receive patient assistance applications in the mail and are in the process of mailing them back

## 2024-06-14 ENCOUNTER — Encounter (HOSPITAL_COMMUNITY)

## 2024-06-20 ENCOUNTER — Telehealth: Payer: Self-pay | Admitting: Pulmonary Disease

## 2024-06-20 NOTE — Telephone Encounter (Signed)
 PAP: Application for Novolog  and [place] has been submitted to [employer], via fax  PAP: Application for [name]  has been submitted to [employer] ([employer]), via fax

## 2024-06-21 ENCOUNTER — Encounter (HOSPITAL_COMMUNITY)

## 2024-06-21 ENCOUNTER — Encounter (HOSPITAL_COMMUNITY): Payer: Self-pay | Admitting: *Deleted

## 2024-06-21 DIAGNOSIS — I5022 Chronic systolic (congestive) heart failure: Secondary | ICD-10-CM

## 2024-06-21 NOTE — Progress Notes (Signed)
 Pulmonary Individual Treatment Plan  Patient Details  Name: Patrick Miranda MRN: [id number] Date of Birth: August 20, 1945 Referring Provider:   Flowsheet Row PULMONARY REHAB OTHER RESP ORIENTATION from 04/25/2024 in [facility] CARDIAC REHABILITATION  Referring Provider Cindie Smalls MD    Initial Encounter Date:  Flowsheet Row PULMONARY REHAB OTHER RESP ORIENTATION from 04/25/2024 in [facility] CARDIAC REHABILITATION  Date 04/25/24    Visit Diagnosis: Chronic systolic HF (heart failure) (HCC)  Patient's Home Medications on Admission:   Current Outpatient Medications:    amiodarone  (PACERONE ) 200 MG tablet, 200 mg BID x 7 days, then 200 mg daily, Disp: 40 tablet, Rfl: 3   apixaban  (ELIQUIS ) 5 MG TABS tablet, TAKE 1 TABLET(5 MG) BY MOUTH TWICE DAILY, Disp: 180 tablet, Rfl: 1   BD PEN NEEDLE NANO U/F 32G X 4 MM MISC, 1 each by Other route daily. , Disp: , Rfl:    Biotin -Vitamin C (HAIR SKIN NAILS GUMMIES PO), Take 2 tablets by mouth daily., Disp: , Rfl:    Continuous Glucose Sensor (FREESTYLE LIBRE 3 PLUS SENSOR) MISC, by Does not apply route. Change sensor every 15 days., Disp: , Rfl:    Cyanocobalamin  (VITAMIN B-12) 5000 MCG SUBL, Place 1 tablet under the tongue daily., Disp: , Rfl:    donepezil  (ARICEPT ) 10 MG tablet, Take 1 tablet (10 mg total) by mouth at bedtime., Disp: 90 tablet, Rfl: 3   empagliflozin  ([name] ) 10 MG TABS tablet, Take 1 tablet (10 mg total) by mouth daily before breakfast., Disp: 90 tablet, Rfl: 1   fenofibrate  160 MG tablet, Take 160 mg by mouth every morning., Disp: , Rfl:    Ferrous Sulfate  (IRON PO), Take 1 tablet by mouth daily., Disp: , Rfl:    Insulin  Aspart (NOVOLOG  FLEXPEN [place]), Inject 8-10 Units into the skin 3 (three) times daily before meals. Dose per sliding scale, Disp: , Rfl:    insulin  degludec (TRESIBA FLEXTOUCH) 100 UNIT/ML FlexTouch Pen, Inject 32 Units into the skin daily., Disp: , Rfl:    levothyroxine  (SYNTHROID ) 50 MCG tablet, Take 50 mcg  by mouth every morning., Disp: , Rfl:    losartan  (COZAAR ) 25 MG tablet, TAKE 1/2 TABLET(12.5 MG) BY MOUTH DAILY, Disp: 45 tablet, Rfl: 1   memantine  (NAMENDA ) 10 MG tablet, Take 1 tablet (10 mg total) by mouth 2 (two) times daily., Disp: 180 tablet, Rfl: 3   metFORMIN  (GLUCOPHAGE ) 500 MG tablet, Take 500 mg by mouth 2 (two) times daily with a meal., Disp: , Rfl:    Multiple Vitamin (MULTIVITAMIN WITH MINERALS) TABS tablet, Take 1 tablet by mouth daily., Disp: , Rfl:    rosuvastatin  (CRESTOR ) 20 MG tablet, TAKE 1 TABLET(20 MG) BY MOUTH DAILY, Disp: 90 tablet, Rfl: 1   sertraline  (ZOLOFT ) 25 MG tablet, Take 1 tablet (25 mg total) by mouth daily., Disp: 90 tablet, Rfl: 3   tamsulosin  (FLOMAX ) 0.4 MG CAPS capsule, Take 0.4 mg by mouth at bedtime., Disp: , Rfl:    Vedolizumab  (ENTYVIO  IV), Inject into the vein every 28 (twenty-eight) days., Disp: , Rfl:   Past Medical History: Past Medical History:  Diagnosis Date   Arthritis    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 s/p CABG in 06/2020 with LIMA-LAD, SVG-D1 and SVG-RCA   COVID-07 Dec 2019   Dementia [facility])    Depression    Essential hypertension    Hernia, umbilical    HOH (  hard of hearing)    Hyperlipidemia    Ischemic cardiomyopathy    Ischemic necrosis of small bowel    Left ventricular mural thrombus    NSTEMI (non-ST elevated myocardial infarction) (HCC)    OSA on CPAP    Pulmonary emboli [facility])    May 2021   Skin cancer    Type 2 diabetes mellitus (HCC)     Tobacco Use: Social History   Tobacco Use  Smoking Status Never  Smokeless Tobacco Never    Labs: Review Flowsheet  More data exists      Latest Ref Rng & Units 07/14/2020 09/09/2020 05/08/2021 06/17/2023 01/06/2024  Labs for ITP Cardiac and Pulmonary Rehab  Cholestrol 0 - 200 mg/dL - 65  - - -  LDL (calc) 0 - 99 mg/dL - 25  - - -  HDL-C >59 mg/dL - 21  - - -  Trlycerides <150 mg/dL - 94  - - -   Hemoglobin A1c 4.8 - 5.6 % - 6.8  8.8  8.1  7.8   Bicarbonate 20.0 - 28.0 mmol/L - - - - 21.9   TCO2 22 - 32 mmol/L - - - - 23   Acid-base deficit 0.0 - 2.0 mmol/L - - - - 4.0   O2 Saturation % 55.5  - - - 74     Capillary Blood Glucose: Lab Results  Component Value Date   GLUCAP 205 (H) 01/12/2024   GLUCAP 235 (H) 01/12/2024   GLUCAP 166 (H) 01/11/2024   GLUCAP 122 (H) 01/11/2024   GLUCAP 127 (H) 01/11/2024     Pulmonary Assessment Scores:  Pulmonary Assessment Scores     Row Name 04/25/24 0852         ADL UCSD   ADL Phase Entry     SOB Score total 21     Rest 0     Walk 0     Stairs 3     Bath 0     Dress 0     Shop 3       CAT Score   CAT Score 14       mMRC Score   mMRC Score 3       UCSD: Self-administered rating of dyspnea associated with activities of daily living (ADLs) 6-point scale (0 = not at all to 5 = maximal or unable to do because of breathlessness)  Scoring Scores range from 0 to 120.  Minimally important difference is 5 units  CAT: CAT can identify the health impairment of COPD patients and is better correlated with disease progression.  CAT has a scoring range of zero to 40. The CAT score is classified into four groups of low (less than 10), medium (10 - 20), high (21-30) and very high (31-40) based on the impact level of disease on health status. A CAT score over 10 suggests significant symptoms.  A worsening CAT score could be explained by an exacerbation, poor medication adherence, poor inhaler technique, or progression of COPD or comorbid conditions.  CAT MCID is 2 points  mMRC: mMRC (Modified Medical Research Council) Dyspnea Scale is used to assess the degree of baseline functional disability in patients of respiratory disease due to dyspnea. No minimal important difference is established. A decrease in score of 1 point or greater is considered a positive change.   Pulmonary Function Assessment:   Exercise Target  Goals: Exercise Program Goal: Individual exercise prescription set using results from initial 6 min walk test and THRR while  considering  patient's activity barriers and safety.   Exercise Prescription Goal: Initial exercise prescription builds to 30-45 minutes a day of aerobic activity, 2-3 days per week.  Home exercise guidelines will be given to patient during program as part of exercise prescription that the participant will acknowledge.  Activity Barriers & Risk Stratification:  Activity Barriers & Cardiac Risk Stratification - 04/25/24 0950       Activity Barriers & Cardiac Risk Stratification   Activity Barriers Assistive Device;Deconditioning;Balance Concerns;Arthritis;Left Knee Replacement;Right Knee Replacement;Shortness of Breath;Decreased Ventricular Function;Muscular Weakness    Cardiac Risk Stratification High          6 Minute Walk:  6 Minute Walk     Row Name 04/25/24 0947         6 Minute Walk   Phase Initial     Distance 895 feet     Walk Time 6 minutes     # of Rest Breaks 0     MPH 1.69     METS 1.74     RPE 13     Perceived Dyspnea  1     VO2 Peak 6.08     Symptoms No     Resting HR 63 bpm     Resting BP 116/60     Resting Oxygen  Saturation  96 %     Exercise Oxygen  Saturation  during 6 min walk 93 %     Max Ex. HR 81 bpm     Max Ex. BP 144/66     2 Minute Post BP 134/70       Interval HR   1 Minute HR 86     2 Minute HR 86     3 Minute HR 90     4 Minute HR 92     5 Minute HR 91     6 Minute HR 90     2 Minute Post HR 69     Interval Heart Rate? Yes       Interval Oxygen    Interval Oxygen ? Yes     Baseline Oxygen  Saturation % 96 %     1 Minute Oxygen  Saturation % 95 %     1 Minute Liters of Oxygen  0 L  Room Air     2 Minute Oxygen  Saturation % 93 %     2 Minute Liters of Oxygen  0 L     3 Minute Oxygen  Saturation % 94 %     3 Minute Liters of Oxygen  0 L     4 Minute Oxygen  Saturation % 94 %     4 Minute Liters of Oxygen  0 L     5  Minute Oxygen  Saturation % 95 %     5 Minute Liters of Oxygen  0 L     6 Minute Oxygen  Saturation % 95 %     6 Minute Liters of Oxygen  0 L     2 Minute Post Oxygen  Saturation % 96 %     2 Minute Post Liters of Oxygen  0 L        Oxygen  Initial Assessment:  Oxygen  Initial Assessment - 04/22/24 1428       Home Oxygen    Home Oxygen  Device None    Sleep Oxygen  Prescription CPAP;None      Intervention   Short Term Goals To learn and understand importance of monitoring SPO2 with pulse oximeter and demonstrate accurate use of the pulse oximeter.;To learn and understand importance of maintaining oxygen  saturations>88%;To learn and demonstrate  proper pursed lip breathing techniques or other breathing techniques. ;To learn and demonstrate proper use of respiratory medications    Long  Term Goals Verbalizes importance of monitoring SPO2 with pulse oximeter and return demonstration;Maintenance of O2 saturations>88%;Exhibits proper breathing techniques, such as pursed lip breathing or other method taught during program session;Compliance with respiratory medication;Demonstrates proper use of MDI's          Oxygen  Re-Evaluation:   Oxygen  Discharge (Final Oxygen  Re-Evaluation):   Initial Exercise Prescription:  Initial Exercise Prescription - 04/25/24 0900       Date of Initial Exercise RX and Referring Provider   Date 04/25/24    Referring Provider Cindie Smalls MD      Oxygen    Maintain Oxygen  Saturation 88% or higher      NuStep   Level 1    SPM 80    Minutes 15    METs 1.7      REL-XR   Level 1    Speed 50    Minutes 15    METs 1.7      Prescription Details   Frequency (times per week) 2    Duration Progress to 30 minutes of continuous aerobic without signs/symptoms of physical distress      Intensity   THRR 40-80% of Max Heartrate 94-125    Ratings of Perceived Exertion 11-13    Perceived Dyspnea 0-4      Progression   Progression Continue to progress workloads  to maintain intensity without signs/symptoms of physical distress.      Resistance Training   Training Prescription Yes    Weight 3 lb    Reps 10-15          Perform Capillary Blood Glucose checks as needed.  Exercise Prescription Changes:   Exercise Prescription Changes     Row Name 04/25/24 0900             Response to Exercise   Blood Pressure (Admit) 116/60       Blood Pressure (Exercise) 144/66       Blood Pressure (Exit) 132/60       Heart Rate (Admit) 63 bpm       Heart Rate (Exercise) 92 bpm       Heart Rate (Exit) 62 bpm       Oxygen  Saturation (Admit) 96 %       Oxygen  Saturation (Exercise) 93 %       Oxygen  Saturation (Exit) 97 %       Rating of Perceived Exertion (Exercise) 13       Perceived Dyspnea (Exercise) 1       Symptoms none       Comments walk test results          Exercise Comments:   Exercise Comments     Row Name 04/26/24 1453           Exercise Comments First full day of exercise!  Patient was oriented to gym and equipment including functions, settings, policies, and procedures.  Patient's individual exercise prescription and treatment plan were reviewed.  All starting workloads were established based on the results of the 6 minute walk test done at initial orientation visit.  The plan for exercise progression was also introduced and progression will be customized based on patient's performance and goals.          Exercise Goals and Review:   Exercise Goals     Row Name 04/25/24 [phone]  Exercise Goals   Increase Physical Activity Yes       Intervention Provide advice, education, support and counseling about physical activity/exercise needs.;Develop an individualized exercise prescription for aerobic and resistive training based on initial evaluation findings, risk stratification, comorbidities and participant's personal goals.       Expected Outcomes Short Term: Attend rehab on a regular basis to increase amount of  physical activity.;Long Term: Add in home exercise to make exercise part of routine and to increase amount of physical activity.;Long Term: Exercising regularly at least 3-5 days a week.       Increase Strength and Stamina Yes       Intervention Provide advice, education, support and counseling about physical activity/exercise needs.;Develop an individualized exercise prescription for aerobic and resistive training based on initial evaluation findings, risk stratification, comorbidities and participant's personal goals.       Expected Outcomes Short Term: Increase workloads from initial exercise prescription for resistance, speed, and METs.;Short Term: Perform resistance training exercises routinely during rehab and add in resistance training at home;Long Term: Improve cardiorespiratory fitness, muscular endurance and strength as measured by increased METs and functional capacity ( )       Able to understand and use rate of perceived exertion (RPE) scale Yes       Intervention Provide education and explanation on how to use RPE scale       Expected Outcomes Short Term: Able to use RPE daily in rehab to express subjective intensity level;Long Term:  Able to use RPE to guide intensity level when exercising independently       Able to understand and use Dyspnea scale Yes       Intervention Provide education and explanation on how to use Dyspnea scale       Expected Outcomes Short Term: Able to use Dyspnea scale daily in rehab to express subjective sense of shortness of breath during exertion;Long Term: Able to use Dyspnea scale to guide intensity level when exercising independently       Knowledge and understanding of Target Heart Rate Range (THRR) Yes       Intervention Provide education and explanation of THRR including how the numbers were predicted and where they are located for reference       Expected Outcomes Short Term: Able to state/look up THRR;Short Term: Able to use daily as guideline for  intensity in rehab;Long Term: Able to use THRR to govern intensity when exercising independently       Able to check pulse independently Yes       Intervention Provide education and demonstration on how to check pulse in carotid and radial arteries.;Review the importance of being able to check your own pulse for safety during independent exercise       Expected Outcomes Short Term: Able to explain why pulse checking is important during independent exercise;Long Term: Able to check pulse independently and accurately       Understanding of Exercise Prescription Yes       Intervention Provide education, explanation, and written materials on patient's individual exercise prescription       Expected Outcomes Short Term: Able to explain program exercise prescription;Long Term: Able to explain home exercise prescription to exercise independently          Exercise Goals Re-Evaluation :  Exercise Goals Re-Evaluation     Row Name 04/26/24 1453             Exercise Goal Re-Evaluation   Exercise Goals Review Increase Physical Activity;Able  to understand and use Dyspnea scale;Increase Strength and Stamina;Able to check pulse independently;Able to understand and use rate of perceived exertion (RPE) scale;Knowledge and understanding of Target Heart Rate Range (THRR);Understanding of Exercise Prescription       Comments Reviewed RPE and dyspnea scale, THR and program prescription with pt today.  Pt voiced understanding and was given a copy of goals to take home.       Expected Outcomes Short: Use RPE daily to regulate intensity.  Long: Follow program prescription in THR.          Discharge Exercise Prescription (Final Exercise Prescription Changes):  Exercise Prescription Changes - 04/25/24 0900       Response to Exercise   Blood Pressure (Admit) 116/60    Blood Pressure (Exercise) 144/66    Blood Pressure (Exit) 132/60    Heart Rate (Admit) 63 bpm    Heart Rate (Exercise) 92 bpm    Heart Rate  (Exit) 62 bpm    Oxygen  Saturation (Admit) 96 %    Oxygen  Saturation (Exercise) 93 %    Oxygen  Saturation (Exit) 97 %    Rating of Perceived Exertion (Exercise) 13    Perceived Dyspnea (Exercise) 1    Symptoms none    Comments walk test results          Nutrition:  Target Goals: Understanding of nutrition guidelines, daily intake of sodium 1500mg , cholesterol 200mg , calories 30% from fat and 7% or less from saturated fats, daily to have 5 or more servings of fruits and vegetables.  Biometrics:  Pre Biometrics - 04/25/24 0954       Pre Biometrics   Height 5' 8 (1.727 m)    Weight 179 lb 14.4 oz (81.6 kg)    Waist Circumference 36 inches    Hip Circumference 39 inches    Waist to Hip Ratio 0.92 %    BMI (Calculated) 27.36    Grip Strength 23.2 kg    Single Leg Stand 2.1 seconds           Nutrition Therapy Plan and Nutrition Goals:   Nutrition Assessments:  MEDIFICTS Score Key: >=70 Need to make dietary changes  40-70 Heart Healthy Diet <= 40 Therapeutic Level Cholesterol Diet  Flowsheet Row PULMONARY VIRTUAL BASED CARE from 04/22/2024 in [facility] CARDIAC REHABILITATION  Picture Your Plate Total Score on Admission 50   Picture Your Plate Scores: <59 Unhealthy dietary pattern with much room for improvement. 41-50 Dietary pattern unlikely to meet recommendations for good health and room for improvement. 51-60 More healthful dietary pattern, with some room for improvement.  >60 Healthy dietary pattern, although there may be some specific behaviors that could be improved.    Nutrition Goals Re-Evaluation:   Nutrition Goals Discharge (Final Nutrition Goals Re-Evaluation):   Psychosocial: Target Goals: Acknowledge presence or absence of significant depression and/or stress, maximize coping skills, provide positive support system. Participant is able to verbalize types and ability to use techniques and skills needed for reducing stress and  depression.  Initial Review & Psychosocial Screening:  Initial Psych Review & Screening - 04/22/24 1432       Initial Review   Current issues with Current Psychotropic Meds;Current Sleep Concerns;Current Depression      Family Dynamics   Good Support System? Yes    Comments Patient's wife and daughter support him.      Barriers   Psychosocial barriers to participate in program The patient should benefit from training in stress management and relaxation.;There are no  identifiable barriers or psychosocial needs.      Screening Interventions   Interventions Encouraged to exercise;To provide support and resources with identified psychosocial needs;Provide feedback about the scores to participant    Expected Outcomes Short Term goal: Utilizing psychosocial counselor, staff and physician to assist with identification of specific Stressors or current issues interfering with healing process. Setting desired goal for each stressor or current issue identified.;Long Term Goal: Stressors or current issues are controlled or eliminated.;Short Term goal: Identification and review with participant of any Quality of Life or Depression concerns found by scoring the questionnaire.;Long Term goal: The participant improves quality of Life and PHQ9 Scores as seen by post scores and/or verbalization of changes          Quality of Life Scores:  Scores of 19 and below usually indicate a poorer quality of life in these areas.  A difference of  2-3 points is a clinically meaningful difference.  A difference of 2-3 points in the total score of the Quality of Life Index has been associated with significant improvement in overall quality of life, self-image, physical symptoms, and general health in studies assessing change in quality of life.   PHQ-9: Review Flowsheet       04/25/2024 01/19/2020 12/20/2019  Depression screen PHQ 2/9  Decreased Interest 3 0 0  Down, Depressed, Hopeless 2 0 1  PHQ - 2 Score 5 0 1   Altered sleeping 3 - -  Tired, decreased energy 3 - -  Change in appetite 3 - -  Feeling bad or failure about yourself  0 - -  Trouble concentrating 3 - -  Moving slowly or fidgety/restless 0 - -  Suicidal thoughts 0 - -  PHQ-9 Score 17  - -  Difficult doing work/chores Very difficult - -    Details       Data saved with a previous flowsheet row definition        Interpretation of Total Score  Total Score Depression Severity:  1-4 = Minimal depression, 5-9 = Mild depression, 10-14 = Moderate depression, 15-19 = Moderately severe depression, 20-27 = Severe depression   Psychosocial Evaluation and Intervention:  Psychosocial Evaluation - 04/22/24 1432       Psychosocial Evaluation & Interventions   Interventions Relaxation education;Encouraged to exercise with the program and follow exercise prescription;Stress management education    Comments Patient was referred to pulmonary rehab with chronic systolic HF. His wife and daughter answered questions for virtual call. He has mild dementia and is being treated with Aricept . He also has some depression treated with Zoloft . He and his wife live with their daughter. His wife says he sleeps 90% of the time. She says he is able to follow directions but may need repeating at times. Her goals for him is to get him moving; to increase his activity level and that he will sleep less. There are no barriers identified for him to complete the program.    Expected Outcomes Short Term: Patient will start the program and attend consistently. Long Term: Patient will complete the program meeting personal goals.    Continue Psychosocial Services  Follow up required by staff          Psychosocial Re-Evaluation:   Psychosocial Discharge (Final Psychosocial Re-Evaluation):    Education: Education Goals: Education classes will be provided on a weekly basis, covering required topics. Participant will state understanding/return demonstration of  topics presented.  Learning Barriers/Preferences:  Learning Barriers/Preferences - 04/22/24 1428  Learning Barriers/Preferences   Learning Barriers Hearing   Patient has dementia.   Learning Preferences Skilled Demonstration          Education Topics: How Lungs Work and Diseases: - Discuss the anatomy of the lungs and diseases that can affect the lungs, such as COPD.   Exercise: -Discuss the importance of exercise, FITT principles of exercise, normal and abnormal responses to exercise, and how to exercise safely.   Environmental Irritants: -Discuss types of environmental irritants and how to limit exposure to environmental irritants.   Meds/Inhalers and oxygen : - Discuss respiratory medications, definition of an inhaler and oxygen , and the proper way to use an inhaler and oxygen .   Energy Saving Techniques: - Discuss methods to conserve energy and decrease shortness of breath when performing activities of daily living.    Bronchial Hygiene / Breathing Techniques: - Discuss breathing mechanics, pursed-lip breathing technique,  proper posture, effective ways to clear airways, and other functional breathing techniques   Cleaning Equipment: - Provides group verbal and written instruction about the health risks of elevated stress, cause of high stress, and healthy ways to reduce stress.   Nutrition I: Fats: - Discuss the types of cholesterol, what cholesterol does to the body, and how cholesterol levels can be controlled.   Nutrition II: Labels: -Discuss the different components of food labels and how to read food labels.   Respiratory Infections: - Discuss the signs and symptoms of respiratory infections, ways to prevent respiratory infections, and the importance of seeking medical treatment when having a respiratory infection.   Stress I: Signs and Symptoms: - Discuss the causes of stress, how stress may lead to anxiety and depression, and ways to limit  stress.   Stress II: Relaxation: -Discuss relaxation techniques to limit stress.   Oxygen  for Home/Travel: - Discuss how to prepare for travel when on oxygen  and proper ways to transport and store oxygen  to ensure safety.   Knowledge Questionnaire Score:  Knowledge Questionnaire Score - 04/22/24 1430       Knowledge Questionnaire Score   Pre Score 13/18          Core Components/Risk Factors/Patient Goals at Admission:  Personal Goals and Risk Factors at Admission - 04/25/24 0955       Core Components/Risk Factors/Patient Goals on Admission    Weight Management Yes;Weight Maintenance    Intervention Weight Management: Develop a combined nutrition and exercise program designed to reach desired caloric intake, while maintaining appropriate intake of nutrient and fiber, sodium and fats, and appropriate energy expenditure required for the weight goal.;Weight Management: Provide education and appropriate resources to help participant work on and attain dietary goals.    Admit Weight 179 lb 14.4 oz (81.6 kg)    Goal Weight: Short Term 174 lb (78.9 kg)    Goal Weight: Long Term 170 lb (77.1 kg)    Expected Outcomes Short Term: Continue to assess and modify interventions until short term weight is achieved;Long Term: Adherence to nutrition and physical activity/exercise program aimed toward attainment of established weight goal;Weight Maintenance: Understanding of the daily nutrition guidelines, which includes 25-35% calories from fat, 7% or less cal from saturated fats, less than 200mg  cholesterol, less than 1.5gm of sodium, & 5 or more servings of fruits and vegetables daily;Weight Loss: Understanding of general recommendations for a balanced deficit meal plan, which promotes 1-2 lb weight loss per week and includes a negative energy balance of [phone] kcal/d;Understanding recommendations for meals to include 15-35% energy as protein, 25-35% energy  from fat, 35-60% energy from  carbohydrates, less than 200mg  of dietary cholesterol, 20-35 gm of total fiber daily;Understanding of distribution of calorie intake throughout the day with the consumption of 4-5 meals/snacks    Improve shortness of breath with ADL's Yes    Intervention Provide education, individualized exercise plan and daily activity instruction to help decrease symptoms of SOB with activities of daily living.    Expected Outcomes Short Term: Improve cardiorespiratory fitness to achieve a reduction of symptoms when performing ADLs;Long Term: Be able to perform more ADLs without symptoms or delay the onset of symptoms    Diabetes Yes    Intervention Provide education about signs/symptoms and action to take for hypo/hyperglycemia.;Provide education about proper nutrition, including hydration, and aerobic/resistive exercise prescription along with prescribed medications to achieve blood glucose in normal ranges: Fasting glucose 65-99 mg/dL    Expected Outcomes Short Term: Participant verbalizes understanding of the signs/symptoms and immediate care of hyper/hypoglycemia, proper foot care and importance of medication, aerobic/resistive exercise and nutrition plan for blood glucose control.;Long Term: Attainment of HbA1C < 7%.    Heart Failure Yes    Intervention Provide a combined exercise and nutrition program that is supplemented with education, support and counseling about heart failure. Directed toward relieving symptoms such as shortness of breath, decreased exercise tolerance, and extremity edema.    Expected Outcomes Improve functional capacity of life;Short term: Attendance in program 2-3 days a week with increased exercise capacity. Reported lower sodium intake. Reported increased fruit and vegetable intake. Reports medication compliance.;Short term: Daily weights obtained and reported for increase. Utilizing diuretic protocols set by physician.;Long term: Adoption of self-care skills and reduction of barriers for  early signs and symptoms recognition and intervention leading to self-care maintenance.    Hypertension Yes    Intervention Provide education on lifestyle modifcations including regular physical activity/exercise, weight management, moderate sodium restriction and increased consumption of fresh fruit, vegetables, and low fat dairy, alcohol  moderation, and smoking cessation.;Monitor prescription use compliance.    Expected Outcomes Short Term: Continued assessment and intervention until BP is < 140/34mm HG in hypertensive participants. < 130/97mm HG in hypertensive participants with diabetes, heart failure or chronic kidney disease.;Long Term: Maintenance of blood pressure at goal levels.    Lipids Yes    Intervention Provide education and support for participant on nutrition & aerobic/resistive exercise along with prescribed medications to achieve LDL 70mg , HDL >40mg .    Expected Outcomes Short Term: Participant states understanding of desired cholesterol values and is compliant with medications prescribed. Participant is following exercise prescription and nutrition guidelines.;Long Term: Cholesterol controlled with medications as prescribed, with individualized exercise RX and with personalized nutrition plan. Value goals: LDL < 70mg , HDL > 40 mg.          Core Components/Risk Factors/Patient Goals Review:    Core Components/Risk Factors/Patient Goals at Discharge (Final Review):    ITP Comments:  ITP Comments     Row Name 04/22/24 1441 04/26/24 1453 04/27/24 1815 05/25/24 0922 05/27/24 1139   ITP Comments Virtual orientation visit completed for pulmonary rehab with chronic systolic HF. On-site orientation visit scheduled for 04/25/24 at 0800. First full day of exercise!  Patient was oriented to gym and equipment including functions, settings, policies, and procedures.  Patient's individual exercise prescription and treatment plan were reviewed.  All starting workloads were established based  on the results of the 6 minute walk test done at initial orientation visit.  The plan for exercise progression was also introduced and progression  will be customized based on patient's performance and goals. 30 day review completed. ITP sent to Dr.Jehanzeb Memon, Medical Director of  Pulmonary Rehab. Continue with ITP unless changes are made by physician.  He only started to exercise yesterday. 30 day review completed. ITP sent to Dr.Jehanzeb Memon, Medical Director of  Pulmonary Rehab. Continue with ITP unless changes are made by physician.  New to the program. Patient's wife called and has had knee surgery and is not able to drive. She wants to cancel his appointments for the remaining of the year. She hopes to be driving again in 6 to 8 weeks.    Row Name 06/21/24 1143           ITP Comments 30 day review completed. ITP sent to Dr.Jehanzeb Memon, Medical Director of  Pulmonary Rehab. Continue with ITP unless changes are made by physician.    Currently on hold post knee surgery until January          Comments: 30 day review

## 2024-06-22 NOTE — Telephone Encounter (Signed)
*  STAT* If patient is at the pharmacy, call can be transferred to refill team.   1. Which medications need to be refilled? (please list name of each medication and dose if known)   amiodarone  (PACERONE ) 200 MG tablet   2. Would you like to learn more about the convenience, safety, & potential cost savings by using the [facility]?   3. Are you open to using the [facility] (Type [facility]. ).   4. Which pharmacy/location (including street and city if local pharmacy) is medication to be sent to?  [id number] - [place], [place] - [address]   5. Do they need a 30 day or 90 day supply?  90 day  Wife [facility]) stated patient has 1 tablet left.  Patient has appointment scheduled with Dr. Debera on 2/6.

## 2024-06-22 NOTE — Telephone Encounter (Signed)
 Requested Prescriptions   Signed Prescriptions Disp Refills   amiodarone  (PACERONE ) 200 MG tablet 90 tablet 0    Sig: Take 1 tablet (200 mg total) by mouth daily.    Authorizing Provider: ANICETO DAPHNE CROME    Ordering User: [email or web address], Allin Frix  C

## 2024-06-23 ENCOUNTER — Encounter (HOSPITAL_COMMUNITY)

## 2024-06-24 NOTE — Telephone Encounter (Signed)
 PAP: Patient assistance application for Novolog  and [place] has been approved by [employer] Companies: [employer] from 07/21/2024 to 07/20/2025. Medication should be delivered to PAP Delivery: Provider's office. For further shipping updates, please contact [employer] at [phone]. Patient ID is: [id number]

## 2024-06-28 ENCOUNTER — Encounter (HOSPITAL_COMMUNITY)

## 2024-06-29 ENCOUNTER — Telehealth: Payer: Self-pay | Admitting: Cardiology

## 2024-06-29 MED ORDER — AMIODARONE HCL 200 MG PO TABS: 200.0000 mg | ORAL_TABLET | Freq: Every day | ORAL | 2 refills | Status: AC

## 2024-06-29 NOTE — Telephone Encounter (Signed)
 Refills has been sent to the pharmacy.

## 2024-06-29 NOTE — Telephone Encounter (Signed)
°*  STAT* If patient is at the pharmacy, call can be transferred to refill team.   1. Which medications need to be refilled? (please list name of each medication and dose if known)   amiodarone  (PACERONE ) 200 MG tablet    2. Which pharmacy/location (including street and city if local pharmacy) is medication to be sent to?  [id number] - [place], [place] - [address]    3. Do they need a 30 day or 90 day supply? 90  Got sent to the wrong pharmacy

## 2024-06-30 ENCOUNTER — Encounter: Payer: Self-pay | Admitting: Podiatry

## 2024-06-30 ENCOUNTER — Encounter (HOSPITAL_COMMUNITY)

## 2024-06-30 ENCOUNTER — Ambulatory Visit: Admitting: Podiatry

## 2024-06-30 DIAGNOSIS — M79676 Pain in unspecified toe(s): Secondary | ICD-10-CM

## 2024-06-30 DIAGNOSIS — D689 Coagulation defect, unspecified: Secondary | ICD-10-CM

## 2024-06-30 DIAGNOSIS — E119 Type 2 diabetes mellitus without complications: Secondary | ICD-10-CM

## 2024-06-30 DIAGNOSIS — B351 Tinea unguium: Secondary | ICD-10-CM

## 2024-06-30 NOTE — Progress Notes (Signed)
This patient returns to my office for at risk foot care.  This patient requires this care by a professional since this patient will be at risk due to having diabetes and CKD.and coagulation defect..  Patient is taking plavix and eliquis causing coagulation defect. This patient is unable to cut nails himself since the patient cannot reach his nails.These nails are painful walking and wearing shoes.  This patient presents for at risk foot care today.  General Appearance  Alert, conversant and in no acute stress.  Vascular  Dorsalis pedis and posterior tibial  pulses are palpable  bilaterally.  Capillary return is within normal limits  bilaterally. Temperature is within normal limits  bilaterally.  Neurologic  Senn-Weinstein monofilament wire test within normal limits  bilaterally. Muscle power within normal limits bilaterally.  Nails Thick disfigured discolored nails with subungual debris  hallux nails  bilaterally. No evidence of bacterial infection or drainage bilaterally.  Asymptomatic left hallux nail.  Orthopedic  No limitations of motion  feet .  No crepitus or effusions noted.  HAV  B/L.  Overlapping second toe left foot.  Skin  normotropic skin with no porokeratosis noted bilaterally.  No signs of infections or ulcers noted.   Asymptomatic pinch callus.  Onychomycosis  Pain in right toes  Pain in left toes  Consent was obtained for treatment procedures.   Mechanical debridement of nails 1-5  bilaterally performed with a nail nipper.  Filed with dremel without incident.    Return office visit 3  months                  Told patient to return for periodic foot care and evaluation due to potential at risk complications.   Helane Gunther DPM

## 2024-07-05 ENCOUNTER — Encounter (HOSPITAL_COMMUNITY)

## 2024-07-07 ENCOUNTER — Encounter (HOSPITAL_COMMUNITY)

## 2024-07-12 ENCOUNTER — Encounter (HOSPITAL_COMMUNITY)

## 2024-07-18 NOTE — Telephone Encounter (Signed)
 PAP: Patient has been denied for patient assistance by [employer]) due to patient above income threshold for program

## 2024-07-19 ENCOUNTER — Encounter (HOSPITAL_COMMUNITY)

## 2024-07-19 ENCOUNTER — Encounter (HOSPITAL_COMMUNITY): Payer: Self-pay | Admitting: *Deleted

## 2024-07-19 DIAGNOSIS — I5022 Chronic systolic (congestive) heart failure: Secondary | ICD-10-CM

## 2024-07-19 NOTE — Progress Notes (Signed)
 Pulmonary Individual Treatment Plan  Patient Details  Name: Patrick Miranda MRN: [id number] Date of Birth: [birth year] Referring Provider:   Flowsheet Row PULMONARY REHAB OTHER RESP ORIENTATION from 04/25/2024 in [facility] CARDIAC REHABILITATION  Referring Provider Cindie Smalls MD    Initial Encounter Date:  Flowsheet Row PULMONARY REHAB OTHER RESP ORIENTATION from 04/25/2024 in [place] CARDIAC REHABILITATION  Date 04/25/24    Visit Diagnosis: Chronic systolic HF (heart failure) (HCC)  Patient's Home Medications on Admission:  Current Medications[1]  Past Medical History: Past Medical History:  Diagnosis Date   Arthritis    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 s/p CABG in 06/2020 with LIMA-LAD, SVG-D1 and SVG-RCA   COVID-07 Dec 2019   Dementia [facility])    Depression    Essential hypertension    Hernia, umbilical    HOH (hard of hearing)    Hyperlipidemia    Ischemic cardiomyopathy    Ischemic necrosis of small bowel    Left ventricular mural thrombus    NSTEMI (non-ST elevated myocardial infarction) (HCC)    OSA on CPAP    Pulmonary emboli [facility])    May 2021   Skin cancer    Type 2 diabetes mellitus (HCC)     Tobacco Use: Tobacco Use History[2]  Labs: Review Flowsheet  More data exists      Latest Ref Rng & Units 07/14/2020 09/09/2020 05/08/2021 06/17/2023 01/06/2024  Labs for ITP Cardiac and Pulmonary Rehab  Cholestrol 0 - 200 mg/dL - 65  - - -  LDL (calc) 0 - 99 mg/dL - 25  - - -  HDL-C >59 mg/dL - 21  - - -  Trlycerides <150 mg/dL - 94  - - -  Hemoglobin A1c 4.8 - 5.6 % - 6.8  8.8  8.1  7.8   Bicarbonate 20.0 - 28.0 mmol/L - - - - 21.9   TCO2 22 - 32 mmol/L - - - - 23   Acid-base deficit 0.0 - 2.0 mmol/L - - - - 4.0   O2 Saturation % 55.5  - - - 74     Capillary Blood Glucose: Lab Results  Component Value Date   GLUCAP 205 (H) 01/12/2024   GLUCAP 235 (H) 01/12/2024   GLUCAP 166 (H)  01/11/2024   GLUCAP 122 (H) 01/11/2024   GLUCAP 127 (H) 01/11/2024     Pulmonary Assessment Scores:  Pulmonary Assessment Scores     Row Name 04/25/24 0852         ADL UCSD   ADL Phase Entry     SOB Score total 21     Rest 0     Walk 0     Stairs 3     Bath 0     Dress 0     Shop 3       CAT Score   CAT Score 14       mMRC Score   mMRC Score 3       UCSD: Self-administered rating of dyspnea associated with activities of daily living (ADLs) 6-point scale (0 = not at all to 5 = maximal or unable to do because of breathlessness)  Scoring Scores range from 0 to 120.  Minimally important difference is 5 units  CAT: CAT can identify the health impairment of COPD patients and is better correlated with disease progression.  CAT has a scoring range of zero to  40. The CAT score is classified into four groups of low (less than 10), medium (10 - 20), high (21-30) and very high (31-40) based on the impact level of disease on health status. A CAT score over 10 suggests significant symptoms.  A worsening CAT score could be explained by an exacerbation, poor medication adherence, poor inhaler technique, or progression of COPD or comorbid conditions.  CAT MCID is 2 points  mMRC: mMRC (Modified Medical Research Council) Dyspnea Scale is used to assess the degree of baseline functional disability in patients of respiratory disease due to dyspnea. No minimal important difference is established. A decrease in score of 1 point or greater is considered a positive change.   Pulmonary Function Assessment:   Exercise Target Goals: Exercise Program Goal: Individual exercise prescription set using results from initial 6 min walk test and THRR while considering  patients activity barriers and safety.   Exercise Prescription Goal: Initial exercise prescription builds to 30-45 minutes a day of aerobic activity, 2-3 days per week.  Home exercise guidelines will be given to patient during  program as part of exercise prescription that the participant will acknowledge.  Activity Barriers & Risk Stratification:  Activity Barriers & Cardiac Risk Stratification - 04/25/24 0950       Activity Barriers & Cardiac Risk Stratification   Activity Barriers Assistive Device;Deconditioning;Balance Concerns;Arthritis;Left Knee Replacement;Right Knee Replacement;Shortness of Breath;Decreased Ventricular Function;Muscular Weakness    Cardiac Risk Stratification High          6 Minute Walk:  6 Minute Walk     Row Name 04/25/24 0947         6 Minute Walk   Phase Initial     Distance 895 feet     Walk Time 6 minutes     # of Rest Breaks 0     MPH 1.69     METS 1.74     RPE 13     Perceived Dyspnea  1     VO2 Peak 6.08     Symptoms No     Resting HR 63 bpm     Resting BP 116/60     Resting Oxygen  Saturation  96 %     Exercise Oxygen  Saturation  during 6 min walk 93 %     Max Ex. HR 81 bpm     Max Ex. BP 144/66     2 Minute Post BP 134/70       Interval HR   1 Minute HR 86     2 Minute HR 86     3 Minute HR 90     4 Minute HR 92     5 Minute HR 91     6 Minute HR 90     2 Minute Post HR 69     Interval Heart Rate? Yes       Interval Oxygen    Interval Oxygen ? Yes     Baseline Oxygen  Saturation % 96 %     1 Minute Oxygen  Saturation % 95 %     1 Minute Liters of Oxygen  0 L  Room Air     2 Minute Oxygen  Saturation % 93 %     2 Minute Liters of Oxygen  0 L     3 Minute Oxygen  Saturation % 94 %     3 Minute Liters of Oxygen  0 L     4 Minute Oxygen  Saturation % 94 %     4 Minute Liters of Oxygen  0 L  5 Minute Oxygen  Saturation % 95 %     5 Minute Liters of Oxygen  0 L     6 Minute Oxygen  Saturation % 95 %     6 Minute Liters of Oxygen  0 L     2 Minute Post Oxygen  Saturation % 96 %     2 Minute Post Liters of Oxygen  0 L        Oxygen  Initial Assessment:  Oxygen  Initial Assessment - 04/22/24 1428       Home Oxygen    Home Oxygen  Device None    Sleep Oxygen   Prescription CPAP;None      Intervention   Short Term Goals To learn and understand importance of monitoring SPO2 with pulse oximeter and demonstrate accurate use of the pulse oximeter.;To learn and understand importance of maintaining oxygen  saturations>88%;To learn and demonstrate proper pursed lip breathing techniques or other breathing techniques. ;To learn and demonstrate proper use of respiratory medications    Long  Term Goals Verbalizes importance of monitoring SPO2 with pulse oximeter and return demonstration;Maintenance of O2 saturations>88%;Exhibits proper breathing techniques, such as pursed lip breathing or other method taught during program session;Compliance with respiratory medication;Demonstrates proper use of MDIs          Oxygen  Re-Evaluation:   Oxygen  Discharge (Final Oxygen  Re-Evaluation):   Initial Exercise Prescription:  Initial Exercise Prescription - 04/25/24 0900       Date of Initial Exercise RX and Referring Provider   Date 04/25/24    Referring Provider Cindie Smalls MD      Oxygen    Maintain Oxygen  Saturation 88% or higher      NuStep   Level 1    SPM 80    Minutes 15    METs 1.7      REL-XR   Level 1    Speed 50    Minutes 15    METs 1.7      Prescription Details   Frequency (times per week) 2    Duration Progress to 30 minutes of continuous aerobic without signs/symptoms of physical distress      Intensity   THRR 40-80% of Max Heartrate 94-125    Ratings of Perceived Exertion 11-13    Perceived Dyspnea 0-4      Progression   Progression Continue to progress workloads to maintain intensity without signs/symptoms of physical distress.      Resistance Training   Training Prescription Yes    Weight 3 lb    Reps 10-15          Perform Capillary Blood Glucose checks as needed.  Exercise Prescription Changes:   Exercise Prescription Changes     Row Name 04/25/24 0900             Response to Exercise   Blood Pressure  (Admit) 116/60       Blood Pressure (Exercise) 144/66       Blood Pressure (Exit) 132/60       Heart Rate (Admit) 63 bpm       Heart Rate (Exercise) 92 bpm       Heart Rate (Exit) 62 bpm       Oxygen  Saturation (Admit) 96 %       Oxygen  Saturation (Exercise) 93 %       Oxygen  Saturation (Exit) 97 %       Rating of Perceived Exertion (Exercise) 13       Perceived Dyspnea (Exercise) 1       Symptoms none  Comments walk test results          Exercise Comments:   Exercise Comments     Row Name 04/26/24 1453           Exercise Comments First full day of exercise!  Patient was oriented to gym and equipment including functions, settings, policies, and procedures.  Patient's individual exercise prescription and treatment plan were reviewed.  All starting workloads were established based on the results of the 6 minute walk test done at initial orientation visit.  The plan for exercise progression was also introduced and progression will be customized based on patient's performance and goals.          Exercise Goals and Review:   Exercise Goals     Row Name 04/25/24 [phone]             Exercise Goals   Increase Physical Activity Yes       Intervention Provide advice, education, support and counseling about physical activity/exercise needs.;Develop an individualized exercise prescription for aerobic and resistive training based on initial evaluation findings, risk stratification, comorbidities and participant's personal goals.       Expected Outcomes Short Term: Attend rehab on a regular basis to increase amount of physical activity.;Long Term: Add in home exercise to make exercise part of routine and to increase amount of physical activity.;Long Term: Exercising regularly at least 3-5 days a week.       Increase Strength and Stamina Yes       Intervention Provide advice, education, support and counseling about physical activity/exercise needs.;Develop an individualized exercise  prescription for aerobic and resistive training based on initial evaluation findings, risk stratification, comorbidities and participant's personal goals.       Expected Outcomes Short Term: Increase workloads from initial exercise prescription for resistance, speed, and METs.;Short Term: Perform resistance training exercises routinely during rehab and add in resistance training at home;Long Term: Improve cardiorespiratory fitness, muscular endurance and strength as measured by increased METs and functional capacity ( )       Able to understand and use rate of perceived exertion (RPE) scale Yes       Intervention Provide education and explanation on how to use RPE scale       Expected Outcomes Short Term: Able to use RPE daily in rehab to express subjective intensity level;Long Term:  Able to use RPE to guide intensity level when exercising independently       Able to understand and use Dyspnea scale Yes       Intervention Provide education and explanation on how to use Dyspnea scale       Expected Outcomes Short Term: Able to use Dyspnea scale daily in rehab to express subjective sense of shortness of breath during exertion;Long Term: Able to use Dyspnea scale to guide intensity level when exercising independently       Knowledge and understanding of Target Heart Rate Range (THRR) Yes       Intervention Provide education and explanation of THRR including how the numbers were predicted and where they are located for reference       Expected Outcomes Short Term: Able to state/look up THRR;Short Term: Able to use daily as guideline for intensity in rehab;Long Term: Able to use THRR to govern intensity when exercising independently       Able to check pulse independently Yes       Intervention Provide education and demonstration on how to check pulse in carotid and radial arteries.;Review the importance  of being able to check your own pulse for safety during independent exercise       Expected Outcomes  Short Term: Able to explain why pulse checking is important during independent exercise;Long Term: Able to check pulse independently and accurately       Understanding of Exercise Prescription Yes       Intervention Provide education, explanation, and written materials on patient's individual exercise prescription       Expected Outcomes Short Term: Able to explain program exercise prescription;Long Term: Able to explain home exercise prescription to exercise independently          Exercise Goals Re-Evaluation :  Exercise Goals Re-Evaluation     Row Name 04/26/24 1453             Exercise Goal Re-Evaluation   Exercise Goals Review Increase Physical Activity;Able to understand and use Dyspnea scale;Increase Strength and Stamina;Able to check pulse independently;Able to understand and use rate of perceived exertion (RPE) scale;Knowledge and understanding of Target Heart Rate Range (THRR);Understanding of Exercise Prescription       Comments Reviewed RPE and dyspnea scale, THR and program prescription with pt today.  Pt voiced understanding and was given a copy of goals to take home.       Expected Outcomes Short: Use RPE daily to regulate intensity.  Long: Follow program prescription in THR.          Discharge Exercise Prescription (Final Exercise Prescription Changes):  Exercise Prescription Changes - 04/25/24 0900       Response to Exercise   Blood Pressure (Admit) 116/60    Blood Pressure (Exercise) 144/66    Blood Pressure (Exit) 132/60    Heart Rate (Admit) 63 bpm    Heart Rate (Exercise) 92 bpm    Heart Rate (Exit) 62 bpm    Oxygen  Saturation (Admit) 96 %    Oxygen  Saturation (Exercise) 93 %    Oxygen  Saturation (Exit) 97 %    Rating of Perceived Exertion (Exercise) 13    Perceived Dyspnea (Exercise) 1    Symptoms none    Comments walk test results          Nutrition:  Target Goals: Understanding of nutrition guidelines, daily intake of sodium 1500mg , cholesterol  200mg , calories 30% from fat and 7% or less from saturated fats, daily to have 5 or more servings of fruits and vegetables.  Biometrics:  Pre Biometrics - 04/25/24 0954       Pre Biometrics   Height 5' 8 (1.727 m)    Weight 179 lb 14.4 oz (81.6 kg)    Waist Circumference 36 inches    Hip Circumference 39 inches    Waist to Hip Ratio 0.92 %    BMI (Calculated) 27.36    Grip Strength 23.2 kg    Single Leg Stand 2.1 seconds           Nutrition Therapy Plan and Nutrition Goals:   Nutrition Assessments:  MEDIFICTS Score Key: >=70 Need to make dietary changes  40-70 Heart Healthy Diet <= 40 Therapeutic Level Cholesterol Diet  Flowsheet Row PULMONARY VIRTUAL BASED CARE from 04/22/2024 in [facility] CARDIAC REHABILITATION  Picture Your Plate Total Score on Admission 50   Picture Your Plate Scores: <59 Unhealthy dietary pattern with much room for improvement. 41-50 Dietary pattern unlikely to meet recommendations for good health and room for improvement. 51-60 More healthful dietary pattern, with some room for improvement.  >60 Healthy dietary pattern, although there may be some  specific behaviors that could be improved.    Nutrition Goals Re-Evaluation:   Nutrition Goals Discharge (Final Nutrition Goals Re-Evaluation):   Psychosocial: Target Goals: Acknowledge presence or absence of significant depression and/or stress, maximize coping skills, provide positive support system. Participant is able to verbalize types and ability to use techniques and skills needed for reducing stress and depression.  Initial Review & Psychosocial Screening:  Initial Psych Review & Screening - 04/22/24 1432       Initial Review   Current issues with Current Psychotropic Meds;Current Sleep Concerns;Current Depression      Family Dynamics   Good Support System? Yes    Comments Patient's wife and daughter support him.      Barriers   Psychosocial barriers to participate in program The  patient should benefit from training in stress management and relaxation.;There are no identifiable barriers or psychosocial needs.      Screening Interventions   Interventions Encouraged to exercise;To provide support and resources with identified psychosocial needs;Provide feedback about the scores to participant    Expected Outcomes Short Term goal: Utilizing psychosocial counselor, staff and physician to assist with identification of specific Stressors or current issues interfering with healing process. Setting desired goal for each stressor or current issue identified.;Long Term Goal: Stressors or current issues are controlled or eliminated.;Short Term goal: Identification and review with participant of any Quality of Life or Depression concerns found by scoring the questionnaire.;Long Term goal: The participant improves quality of Life and PHQ9 Scores as seen by post scores and/or verbalization of changes          Quality of Life Scores:  Scores of 19 and below usually indicate a poorer quality of life in these areas.  A difference of  2-3 points is a clinically meaningful difference.  A difference of 2-3 points in the total score of the Quality of Life Index has been associated with significant improvement in overall quality of life, self-image, physical symptoms, and general health in studies assessing change in quality of life.   PHQ-9: Review Flowsheet       04/25/2024 01/19/2020 12/20/2019  Depression screen PHQ 2/9  Decreased Interest 3 0 0  Down, Depressed, Hopeless 2 0 1  PHQ - 2 Score 5 0 1  Altered sleeping 3 - -  Tired, decreased energy 3 - -  Change in appetite 3 - -  Feeling bad or failure about yourself  0 - -  Trouble concentrating 3 - -  Moving slowly or fidgety/restless 0 - -  Suicidal thoughts 0 - -  PHQ-9 Score 17  - -  Difficult doing work/chores Very difficult - -    Details       Data saved with a previous flowsheet row definition        Interpretation  of Total Score  Total Score Depression Severity:  1-4 = Minimal depression, 5-9 = Mild depression, 10-14 = Moderate depression, 15-19 = Moderately severe depression, 20-27 = Severe depression   Psychosocial Evaluation and Intervention:  Psychosocial Evaluation - 04/22/24 1432       Psychosocial Evaluation & Interventions   Interventions Relaxation education;Encouraged to exercise with the program and follow exercise prescription;Stress management education    Comments Patient was referred to pulmonary rehab with chronic systolic HF. His wife and daughter answered questions for virtual call. He has mild dementia and is being treated with Aricept . He also has some depression treated with Zoloft . He and his wife live with their daughter. His wife  says he sleeps 90% of the time. She says he is able to follow directions but may need repeating at times. Her goals for him is to get him moving; to increase his activity level and that he will sleep less. There are no barriers identified for him to complete the program.    Expected Outcomes Short Term: Patient will start the program and attend consistently. Long Term: Patient will complete the program meeting personal goals.    Continue Psychosocial Services  Follow up required by staff          Psychosocial Re-Evaluation:   Psychosocial Discharge (Final Psychosocial Re-Evaluation):    Education: Education Goals: Education classes will be provided on a weekly basis, covering required topics. Participant will state understanding/return demonstration of topics presented.  Learning Barriers/Preferences:  Learning Barriers/Preferences - 04/22/24 1428       Learning Barriers/Preferences   Learning Barriers Hearing   Patient has dementia.   Learning Preferences Skilled Demonstration          Education Topics: How Lungs Work and Diseases: - Discuss the anatomy of the lungs and diseases that can affect the lungs, such as  COPD.   Exercise: -Discuss the importance of exercise, FITT principles of exercise, normal and abnormal responses to exercise, and how to exercise safely.   Environmental Irritants: -Discuss types of environmental irritants and how to limit exposure to environmental irritants.   Meds/Inhalers and oxygen : - Discuss respiratory medications, definition of an inhaler and oxygen , and the proper way to use an inhaler and oxygen .   Energy Saving Techniques: - Discuss methods to conserve energy and decrease shortness of breath when performing activities of daily living.    Bronchial Hygiene / Breathing Techniques: - Discuss breathing mechanics, pursed-lip breathing technique,  proper posture, effective ways to clear airways, and other functional breathing techniques   Cleaning Equipment: - Provides group verbal and written instruction about the health risks of elevated stress, cause of high stress, and healthy ways to reduce stress.   Nutrition I: Fats: - Discuss the types of cholesterol, what cholesterol does to the body, and how cholesterol levels can be controlled.   Nutrition II: Labels: -Discuss the different components of food labels and how to read food labels.   Respiratory Infections: - Discuss the signs and symptoms of respiratory infections, ways to prevent respiratory infections, and the importance of seeking medical treatment when having a respiratory infection.   Stress I: Signs and Symptoms: - Discuss the causes of stress, how stress may lead to anxiety and depression, and ways to limit stress.   Stress II: Relaxation: -Discuss relaxation techniques to limit stress.   Oxygen  for Home/Travel: - Discuss how to prepare for travel when on oxygen  and proper ways to transport and store oxygen  to ensure safety.   Knowledge Questionnaire Score:  Knowledge Questionnaire Score - 04/22/24 1430       Knowledge Questionnaire Score   Pre Score 13/18           Core Components/Risk Factors/Patient Goals at Admission:  Personal Goals and Risk Factors at Admission - 04/25/24 0955       Core Components/Risk Factors/Patient Goals on Admission    Weight Management Yes;Weight Maintenance    Intervention Weight Management: Develop a combined nutrition and exercise program designed to reach desired caloric intake, while maintaining appropriate intake of nutrient and fiber, sodium and fats, and appropriate energy expenditure required for the weight goal.;Weight Management: Provide education and appropriate resources to help participant work on  and attain dietary goals.    Admit Weight 179 lb 14.4 oz (81.6 kg)    Goal Weight: Short Term 174 lb (78.9 kg)    Goal Weight: Long Term 170 lb (77.1 kg)    Expected Outcomes Short Term: Continue to assess and modify interventions until short term weight is achieved;Long Term: Adherence to nutrition and physical activity/exercise program aimed toward attainment of established weight goal;Weight Maintenance: Understanding of the daily nutrition guidelines, which includes 25-35% calories from fat, 7% or less cal from saturated fats, less than 200mg  cholesterol, less than 1.5gm of sodium, & 5 or more servings of fruits and vegetables daily;Weight Loss: Understanding of general recommendations for a balanced deficit meal plan, which promotes 1-2 lb weight loss per week and includes a negative energy balance of [phone] kcal/d;Understanding recommendations for meals to include 15-35% energy as protein, 25-35% energy from fat, 35-60% energy from carbohydrates, less than 200mg  of dietary cholesterol, 20-35 gm of total fiber daily;Understanding of distribution of calorie intake throughout the day with the consumption of 4-5 meals/snacks    Improve shortness of breath with ADL's Yes    Intervention Provide education, individualized exercise plan and daily activity instruction to help decrease symptoms of SOB with activities of daily  living.    Expected Outcomes Short Term: Improve cardiorespiratory fitness to achieve a reduction of symptoms when performing ADLs;Long Term: Be able to perform more ADLs without symptoms or delay the onset of symptoms    Diabetes Yes    Intervention Provide education about signs/symptoms and action to take for hypo/hyperglycemia.;Provide education about proper nutrition, including hydration, and aerobic/resistive exercise prescription along with prescribed medications to achieve blood glucose in normal ranges: Fasting glucose 65-99 mg/dL    Expected Outcomes Short Term: Participant verbalizes understanding of the signs/symptoms and immediate care of hyper/hypoglycemia, proper foot care and importance of medication, aerobic/resistive exercise and nutrition plan for blood glucose control.;Long Term: Attainment of HbA1C < 7%.    Heart Failure Yes    Intervention Provide a combined exercise and nutrition program that is supplemented with education, support and counseling about heart failure. Directed toward relieving symptoms such as shortness of breath, decreased exercise tolerance, and extremity edema.    Expected Outcomes Improve functional capacity of life;Short term: Attendance in program 2-3 days a week with increased exercise capacity. Reported lower sodium intake. Reported increased fruit and vegetable intake. Reports medication compliance.;Short term: Daily weights obtained and reported for increase. Utilizing diuretic protocols set by physician.;Long term: Adoption of self-care skills and reduction of barriers for early signs and symptoms recognition and intervention leading to self-care maintenance.    Hypertension Yes    Intervention Provide education on lifestyle modifcations including regular physical activity/exercise, weight management, moderate sodium restriction and increased consumption of fresh fruit, vegetables, and low fat dairy, alcohol  moderation, and smoking cessation.;Monitor  prescription use compliance.    Expected Outcomes Short Term: Continued assessment and intervention until BP is < 140/38mm HG in hypertensive participants. < 130/2mm HG in hypertensive participants with diabetes, heart failure or chronic kidney disease.;Long Term: Maintenance of blood pressure at goal levels.    Lipids Yes    Intervention Provide education and support for participant on nutrition & aerobic/resistive exercise along with prescribed medications to achieve LDL 70mg , HDL >40mg .    Expected Outcomes Short Term: Participant states understanding of desired cholesterol values and is compliant with medications prescribed. Participant is following exercise prescription and nutrition guidelines.;Long Term: Cholesterol controlled with medications as prescribed, with individualized exercise  RX and with personalized nutrition plan. Value goals: LDL < 70mg , HDL > 40 mg.          Core Components/Risk Factors/Patient Goals Review:    Core Components/Risk Factors/Patient Goals at Discharge (Final Review):    ITP Comments:  ITP Comments     Row Name 04/22/24 1441 04/26/24 1453 04/27/24 1815 05/25/24 0922 05/27/24 1139   ITP Comments Virtual orientation visit completed for pulmonary rehab with chronic systolic HF. On-site orientation visit scheduled for 04/25/24 at 0800. First full day of exercise!  Patient was oriented to gym and equipment including functions, settings, policies, and procedures.  Patient's individual exercise prescription and treatment plan were reviewed.  All starting workloads were established based on the results of the 6 minute walk test done at initial orientation visit.  The plan for exercise progression was also introduced and progression will be customized based on patient's performance and goals. 30 day review completed. ITP sent to Dr.Jehanzeb Memon, Medical Director of  Pulmonary Rehab. Continue with ITP unless changes are made by physician.  He only started to exercise  yesterday. 30 day review completed. ITP sent to Dr.Jehanzeb Memon, Medical Director of  Pulmonary Rehab. Continue with ITP unless changes are made by physician.  New to the program. Patient's wife called and has had knee surgery and is not able to drive. She wants to cancel his appointments for the remaining of the year. She hopes to be driving again in 6 to 8 weeks.    Row Name 06/21/24 1143 07/19/24 1702         ITP Comments 30 day review completed. ITP sent to Dr.Jehanzeb Memon, Medical Director of  Pulmonary Rehab. Continue with ITP unless changes are made by physician.    Currently on hold post knee surgery until January 30 day review completed. ITP sent to Dr.Jehanzeb Memon, Medical Director of  Pulmonary Rehab. Continue with ITP unless changes are made by physician.    Currently on hold post knee surgery until January         Comments: 30 day review      [1]  Current Outpatient Medications:    amiodarone  (PACERONE ) 200 MG tablet, Take 1 tablet (200 mg total) by mouth daily., Disp: 90 tablet, Rfl: 2   apixaban  (ELIQUIS ) 5 MG TABS tablet, TAKE 1 TABLET(5 MG) BY MOUTH TWICE DAILY, Disp: 180 tablet, Rfl: 1   BD PEN NEEDLE NANO U/F 32G X 4 MM MISC, 1 each by Other route daily. , Disp: , Rfl:    Biotin -Vitamin C (HAIR SKIN NAILS GUMMIES PO), Take 2 tablets by mouth daily., Disp: , Rfl:    Continuous Glucose Sensor (FREESTYLE LIBRE 3 PLUS SENSOR) MISC, by Does not apply route. Change sensor every 15 days., Disp: , Rfl:    Cyanocobalamin  (VITAMIN B-12) 5000 MCG SUBL, Place 1 tablet under the tongue daily., Disp: , Rfl:    donepezil  (ARICEPT ) 10 MG tablet, Take 1 tablet (10 mg total) by mouth at bedtime., Disp: 90 tablet, Rfl: 3   empagliflozin  ([name] ) 10 MG TABS tablet, Take 1 tablet (10 mg total) by mouth daily before breakfast., Disp: 90 tablet, Rfl: 1   fenofibrate  160 MG tablet, Take 160 mg by mouth every morning., Disp: , Rfl:    Ferrous Sulfate  (IRON PO), Take 1 tablet by mouth  daily., Disp: , Rfl:    Insulin  Aspart (NOVOLOG  FLEXPEN [place]), Inject 8-10 Units into the skin 3 (three) times daily before meals. Dose per sliding scale, Disp: ,  Rfl:    insulin  degludec (TRESIBA FLEXTOUCH) 100 UNIT/ML FlexTouch Pen, Inject 32 Units into the skin daily., Disp: , Rfl:    levothyroxine  (SYNTHROID ) 50 MCG tablet, Take 50 mcg by mouth every morning., Disp: , Rfl:    losartan  (COZAAR ) 25 MG tablet, TAKE 1/2 TABLET(12.5 MG) BY MOUTH DAILY, Disp: 45 tablet, Rfl: 1   memantine  (NAMENDA ) 10 MG tablet, Take 1 tablet (10 mg total) by mouth 2 (two) times daily., Disp: 180 tablet, Rfl: 3   metFORMIN  (GLUCOPHAGE ) 500 MG tablet, Take 500 mg by mouth 2 (two) times daily with a meal., Disp: , Rfl:    Multiple Vitamin (MULTIVITAMIN WITH MINERALS) TABS tablet, Take 1 tablet by mouth daily., Disp: , Rfl:    rosuvastatin  (CRESTOR ) 20 MG tablet, TAKE 1 TABLET(20 MG) BY MOUTH DAILY, Disp: 90 tablet, Rfl: 1   sertraline  (ZOLOFT ) 25 MG tablet, Take 1 tablet (25 mg total) by mouth daily., Disp: 90 tablet, Rfl: 3   tamsulosin  (FLOMAX ) 0.4 MG CAPS capsule, Take 0.4 mg by mouth at bedtime., Disp: , Rfl:    Vedolizumab  (ENTYVIO  IV), Inject into the vein every 28 (twenty-eight) days., Disp: , Rfl:  [2]  Social History Tobacco Use  Smoking Status Never  Smokeless Tobacco Never

## 2024-07-21 ENCOUNTER — Encounter (HOSPITAL_COMMUNITY)

## 2024-07-26 ENCOUNTER — Encounter (HOSPITAL_COMMUNITY): Attending: Cardiology

## 2024-07-26 DIAGNOSIS — I5022 Chronic systolic (congestive) heart failure: Secondary | ICD-10-CM | POA: Insufficient documentation

## 2024-07-28 ENCOUNTER — Encounter (HOSPITAL_COMMUNITY)

## 2024-08-02 ENCOUNTER — Encounter (HOSPITAL_COMMUNITY)

## 2024-08-04 ENCOUNTER — Encounter (HOSPITAL_COMMUNITY)

## 2024-08-09 ENCOUNTER — Telehealth (HOSPITAL_COMMUNITY): Payer: Self-pay

## 2024-08-09 ENCOUNTER — Encounter (HOSPITAL_COMMUNITY)

## 2024-08-09 NOTE — Telephone Encounter (Signed)
 Called patient about missed appointment and returning to rehab. Patient did not answer so left a message for them to call back

## 2024-08-11 ENCOUNTER — Encounter (HOSPITAL_COMMUNITY): Admission: RE | Admit: 2024-08-11 | Source: Ambulatory Visit

## 2024-08-16 ENCOUNTER — Encounter (HOSPITAL_COMMUNITY): Admission: RE | Admit: 2024-08-16 | Source: Ambulatory Visit

## 2024-08-16 ENCOUNTER — Encounter (HOSPITAL_COMMUNITY): Payer: Self-pay | Admitting: *Deleted

## 2024-08-16 DIAGNOSIS — I5022 Chronic systolic (congestive) heart failure: Secondary | ICD-10-CM

## 2024-08-16 NOTE — Progress Notes (Signed)
 Discharge Progress Report  Patient Details  Name: Patrick Miranda MRN: [id number] Date of Birth: 03-31-1946 Referring Provider:   Flowsheet Row PULMONARY REHAB OTHER RESP ORIENTATION from 04/25/2024 in [facility] CARDIAC REHABIL[facility]ATION  Referring Provider Cindie Smalls MD     Number of Visits: 6  Reason for Discharge:  Early Exit:  knee surgery and has not returned  Smoking History:  Tobacco Use History[1]  Diagnosis:  Chronic systolic HF (heart failure) (HCC)  ADL UCSD:  Pulmonary Assessment Scores     Row Name 04/25/24 0852         ADL UCSD   ADL Phase Entry     SOB Score total 21     Rest 0     Walk 0     Stairs 3     Bath 0     Dress 0     Shop 3       CAT Score   CAT Score 14       mMRC Score   mMRC Score 3        Initial Exercise Prescription:  Initial Exercise Prescription - 04/25/24 0900       Date of Initial Exercise RX and Referring Provider   Date 04/25/24    Referring Provider Cindie Smalls MD      Oxygen    Maintain Oxygen  Saturation 88% or higher      NuStep   Level 1    SPM 80    Minutes 15    METs 1.7      REL-XR   Level 1    Speed 50    Minutes 15    METs 1.7      Prescription Details   Frequency (times per week) 2    Duration Progress to 30 minutes of continuous aerobic without signs/symptoms of physical distress      Intensity   THRR 40-80% of Max Heartrate 94-125    Ratings of Perceived Exertion 11-13    Perceived Dyspnea 0-4      Progression   Progression Continue to progress workloads to maintain intensity without signs/symptoms of physical distress.      Resistance Training   Training Prescription Yes    Weight 3 lb    Reps 10-15          Discharge Exercise Prescription (Final Exercise Prescription Changes):  Exercise Prescription Changes - 04/25/24 0900       Response to Exercise   Blood Pressure (Admit) 116/60    Blood Pressure (Exercise) 144/66    Blood Pressure (Exit) 132/60    Heart  Rate (Admit) 63 bpm    Heart Rate (Exercise) 92 bpm    Heart Rate (Exit) 62 bpm    Oxygen  Saturation (Admit) 96 %    Oxygen  Saturation (Exercise) 93 %    Oxygen  Saturation (Exit) 97 %    Rating of Perceived Exertion (Exercise) 13    Perceived Dyspnea (Exercise) 1    Symptoms none    Comments walk test results          Functional Capacity:  6 Minute Walk     Row Name 04/25/24 0947         6 Minute Walk   Phase Initial     Distance 895 feet     Walk Time 6 minutes     # of Rest Breaks 0     MPH 1.69     METS 1.74     RPE 13  Perceived Dyspnea  1     VO2 Peak 6.08     Symptoms No     Resting HR 63 bpm     Resting BP 116/60     Resting Oxygen  Saturation  96 %     Exercise Oxygen  Saturation  during 6 min walk 93 %     Max Ex. HR 81 bpm     Max Ex. BP 144/66     2 Minute Post BP 134/70       Interval HR   1 Minute HR 86     2 Minute HR 86     3 Minute HR 90     4 Minute HR 92     5 Minute HR 91     6 Minute HR 90     2 Minute Post HR 69     Interval Heart Rate? Yes       Interval Oxygen    Interval Oxygen ? Yes     Baseline Oxygen  Saturation % 96 %     1 Minute Oxygen  Saturation % 95 %     1 Minute Liters of Oxygen  0 L  Room Air     2 Minute Oxygen  Saturation % 93 %     2 Minute Liters of Oxygen  0 L     3 Minute Oxygen  Saturation % 94 %     3 Minute Liters of Oxygen  0 L     4 Minute Oxygen  Saturation % 94 %     4 Minute Liters of Oxygen  0 L     5 Minute Oxygen  Saturation % 95 %     5 Minute Liters of Oxygen  0 L     6 Minute Oxygen  Saturation % 95 %     6 Minute Liters of Oxygen  0 L     2 Minute Post Oxygen  Saturation % 96 %     2 Minute Post Liters of Oxygen  0 L        Psychological, QOL, Others - Outcomes: PHQ 2/9:    04/25/2024    8:50 AM 01/19/2020   12:45 PM 12/20/2019   11:15 AM  Depression screen PHQ 2/9  Decreased Interest 3 0 0  Down, Depressed, Hopeless 2 0 1  PHQ - 2 Score 5 0 1  Altered sleeping 3    Tired, decreased energy 3     Change in appetite 3    Feeling bad or failure about yourself  0    Trouble concentrating 3    Moving slowly or fidgety/restless 0    Suicidal thoughts 0    PHQ-9 Score 17     Difficult doing work/chores Very difficult       Data saved with a previous flowsheet row definition          [1]  Social History Tobacco Use  Smoking Status Never  Smokeless Tobacco Never

## 2024-08-16 NOTE — Progress Notes (Signed)
 Pulmonary Individual Treatment Plan  Patient Details  Name: Patrick Miranda MRN: [id number] Date of Birth: 05/12/1946 Referring Provider:   Flowsheet Row PULMONARY REHAB OTHER RESP ORIENTATION from 04/25/2024 in [facility] CARDIAC REHABILITATION  Referring Provider Cindie Smalls MD    Initial Encounter Date:  Flowsheet Row PULMONARY REHAB OTHER RESP ORIENTATION from 04/25/2024 in [place] CARDIAC REHABILITATION  Date 04/25/24    Visit Diagnosis: Chronic systolic HF (heart failure) (HCC)  Patient's Home Medications on Admission:  Current Medications[1]  Past Medical History: Past Medical History:  Diagnosis Date   Arthritis    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 s/p CABG in 06/2020 with LIMA-LAD, SVG-D1 and SVG-RCA   COVID-07 Dec 2019   Dementia [facility])    Depression    Essential hypertension    Hernia, umbilical    HOH (hard of hearing)    Hyperlipidemia    Ischemic cardiomyopathy    Ischemic necrosis of small bowel    Left ventricular mural thrombus    NSTEMI (non-ST elevated myocardial infarction) (HCC)    OSA on CPAP    Pulmonary emboli [facility])    May 2021   Skin cancer    Type 2 diabetes mellitus (HCC)     Tobacco Use: Tobacco Use History[2]  Labs: Review Flowsheet  More data exists      Latest Ref Rng & Units 07/14/2020 09/09/2020 05/08/2021 06/17/2023 01/06/2024  Labs for ITP Cardiac and Pulmonary Rehab  Cholestrol 0 - 200 mg/dL - 65  - - -  LDL (calc) 0 - 99 mg/dL - 25  - - -  HDL-C >59 mg/dL - 21  - - -  Trlycerides <150 mg/dL - 94  - - -  Hemoglobin A1c 4.8 - 5.6 % - 6.8  8.8  8.1  7.8   Bicarbonate 20.0 - 28.0 mmol/L - - - - 21.9   TCO2 22 - 32 mmol/L - - - - 23   Acid-base deficit 0.0 - 2.0 mmol/L - - - - 4.0   O2 Saturation % 55.5  - - - 74     Capillary Blood Glucose: Lab Results  Component Value Date   GLUCAP 205 (H) 01/12/2024   GLUCAP 235 (H) 01/12/2024   GLUCAP 166 (H)  01/11/2024   GLUCAP 122 (H) 01/11/2024   GLUCAP 127 (H) 01/11/2024     Pulmonary Assessment Scores:  Pulmonary Assessment Scores     Row Name 04/25/24 0852         ADL UCSD   ADL Phase Entry     SOB Score total 21     Rest 0     Walk 0     Stairs 3     Bath 0     Dress 0     Shop 3       CAT Score   CAT Score 14       mMRC Score   mMRC Score 3       UCSD: Self-administered rating of dyspnea associated with activities of daily living (ADLs) 6-point scale (0 = not at all to 5 = maximal or unable to do because of breathlessness)  Scoring Scores range from 0 to 120.  Minimally important difference is 5 units  CAT: CAT can identify the health impairment of COPD patients and is better correlated with disease progression.  CAT has a scoring range of zero to  40. The CAT score is classified into four groups of low (less than 10), medium (10 - 20), high (21-30) and very high (31-40) based on the impact level of disease on health status. A CAT score over 10 suggests significant symptoms.  A worsening CAT score could be explained by an exacerbation, poor medication adherence, poor inhaler technique, or progression of COPD or comorbid conditions.  CAT MCID is 2 points  mMRC: mMRC (Modified Medical Research Council) Dyspnea Scale is used to assess the degree of baseline functional disability in patients of respiratory disease due to dyspnea. No minimal important difference is established. A decrease in score of 1 point or greater is considered a positive change.   Pulmonary Function Assessment:   Exercise Target Goals: Exercise Program Goal: Individual exercise prescription set using results from initial 6 min walk test and THRR while considering  patients activity barriers and safety.   Exercise Prescription Goal: Initial exercise prescription builds to 30-45 minutes a day of aerobic activity, 2-3 days per week.  Home exercise guidelines will be given to patient during  program as part of exercise prescription that the participant will acknowledge.  Activity Barriers & Risk Stratification:  Activity Barriers & Cardiac Risk Stratification - 04/25/24 0950       Activity Barriers & Cardiac Risk Stratification   Activity Barriers Assistive Device;Deconditioning;Balance Concerns;Arthritis;Left Knee Replacement;Right Knee Replacement;Shortness of Breath;Decreased Ventricular Function;Muscular Weakness    Cardiac Risk Stratification High          6 Minute Walk:  6 Minute Walk     Row Name 04/25/24 0947         6 Minute Walk   Phase Initial     Distance 895 feet     Walk Time 6 minutes     # of Rest Breaks 0     MPH 1.69     METS 1.74     RPE 13     Perceived Dyspnea  1     VO2 Peak 6.08     Symptoms No     Resting HR 63 bpm     Resting BP 116/60     Resting Oxygen  Saturation  96 %     Exercise Oxygen  Saturation  during 6 min walk 93 %     Max Ex. HR 81 bpm     Max Ex. BP 144/66     2 Minute Post BP 134/70       Interval HR   1 Minute HR 86     2 Minute HR 86     3 Minute HR 90     4 Minute HR 92     5 Minute HR 91     6 Minute HR 90     2 Minute Post HR 69     Interval Heart Rate? Yes       Interval Oxygen    Interval Oxygen ? Yes     Baseline Oxygen  Saturation % 96 %     1 Minute Oxygen  Saturation % 95 %     1 Minute Liters of Oxygen  0 L  Room Air     2 Minute Oxygen  Saturation % 93 %     2 Minute Liters of Oxygen  0 L     3 Minute Oxygen  Saturation % 94 %     3 Minute Liters of Oxygen  0 L     4 Minute Oxygen  Saturation % 94 %     4 Minute Liters of Oxygen  0 L  5 Minute Oxygen  Saturation % 95 %     5 Minute Liters of Oxygen  0 L     6 Minute Oxygen  Saturation % 95 %     6 Minute Liters of Oxygen  0 L     2 Minute Post Oxygen  Saturation % 96 %     2 Minute Post Liters of Oxygen  0 L        Oxygen  Initial Assessment:  Oxygen  Initial Assessment - 04/22/24 1428       Home Oxygen    Home Oxygen  Device None    Sleep Oxygen   Prescription CPAP;None      Intervention   Short Term Goals To learn and understand importance of monitoring SPO2 with pulse oximeter and demonstrate accurate use of the pulse oximeter.;To learn and understand importance of maintaining oxygen  saturations>88%;To learn and demonstrate proper pursed lip breathing techniques or other breathing techniques. ;To learn and demonstrate proper use of respiratory medications    Long  Term Goals Verbalizes importance of monitoring SPO2 with pulse oximeter and return demonstration;Maintenance of O2 saturations>88%;Exhibits proper breathing techniques, such as pursed lip breathing or other method taught during program session;Compliance with respiratory medication;Demonstrates proper use of MDIs          Oxygen  Re-Evaluation:   Oxygen  Discharge (Final Oxygen  Re-Evaluation):   Initial Exercise Prescription:  Initial Exercise Prescription - 04/25/24 0900       Date of Initial Exercise RX and Referring Provider   Date 04/25/24    Referring Provider Cindie Smalls MD      Oxygen    Maintain Oxygen  Saturation 88% or higher      NuStep   Level 1    SPM 80    Minutes 15    METs 1.7      REL-XR   Level 1    Speed 50    Minutes 15    METs 1.7      Prescription Details   Frequency (times per week) 2    Duration Progress to 30 minutes of continuous aerobic without signs/symptoms of physical distress      Intensity   THRR 40-80% of Max Heartrate 94-125    Ratings of Perceived Exertion 11-13    Perceived Dyspnea 0-4      Progression   Progression Continue to progress workloads to maintain intensity without signs/symptoms of physical distress.      Resistance Training   Training Prescription Yes    Weight 3 lb    Reps 10-15          Perform Capillary Blood Glucose checks as needed.  Exercise Prescription Changes:   Exercise Prescription Changes     Row Name 04/25/24 0900             Response to Exercise   Blood Pressure  (Admit) 116/60       Blood Pressure (Exercise) 144/66       Blood Pressure (Exit) 132/60       Heart Rate (Admit) 63 bpm       Heart Rate (Exercise) 92 bpm       Heart Rate (Exit) 62 bpm       Oxygen  Saturation (Admit) 96 %       Oxygen  Saturation (Exercise) 93 %       Oxygen  Saturation (Exit) 97 %       Rating of Perceived Exertion (Exercise) 13       Perceived Dyspnea (Exercise) 1       Symptoms none  Comments walk test results          Exercise Comments:   Exercise Comments     Row Name 04/26/24 1453           Exercise Comments First full day of exercise!  Patient was oriented to gym and equipment including functions, settings, policies, and procedures.  Patient's individual exercise prescription and treatment plan were reviewed.  All starting workloads were established based on the results of the 6 minute walk test done at initial orientation visit.  The plan for exercise progression was also introduced and progression will be customized based on patient's performance and goals.          Exercise Goals and Review:   Exercise Goals     Row Name 04/25/24 [phone]             Exercise Goals   Increase Physical Activity Yes       Intervention Provide advice, education, support and counseling about physical activity/exercise needs.;Develop an individualized exercise prescription for aerobic and resistive training based on initial evaluation findings, risk stratification, comorbidities and participant's personal goals.       Expected Outcomes Short Term: Attend rehab on a regular basis to increase amount of physical activity.;Long Term: Add in home exercise to make exercise part of routine and to increase amount of physical activity.;Long Term: Exercising regularly at least 3-5 days a week.       Increase Strength and Stamina Yes       Intervention Provide advice, education, support and counseling about physical activity/exercise needs.;Develop an individualized exercise  prescription for aerobic and resistive training based on initial evaluation findings, risk stratification, comorbidities and participant's personal goals.       Expected Outcomes Short Term: Increase workloads from initial exercise prescription for resistance, speed, and METs.;Short Term: Perform resistance training exercises routinely during rehab and add in resistance training at home;Long Term: Improve cardiorespiratory fitness, muscular endurance and strength as measured by increased METs and functional capacity ( )       Able to understand and use rate of perceived exertion (RPE) scale Yes       Intervention Provide education and explanation on how to use RPE scale       Expected Outcomes Short Term: Able to use RPE daily in rehab to express subjective intensity level;Long Term:  Able to use RPE to guide intensity level when exercising independently       Able to understand and use Dyspnea scale Yes       Intervention Provide education and explanation on how to use Dyspnea scale       Expected Outcomes Short Term: Able to use Dyspnea scale daily in rehab to express subjective sense of shortness of breath during exertion;Long Term: Able to use Dyspnea scale to guide intensity level when exercising independently       Knowledge and understanding of Target Heart Rate Range (THRR) Yes       Intervention Provide education and explanation of THRR including how the numbers were predicted and where they are located for reference       Expected Outcomes Short Term: Able to state/look up THRR;Short Term: Able to use daily as guideline for intensity in rehab;Long Term: Able to use THRR to govern intensity when exercising independently       Able to check pulse independently Yes       Intervention Provide education and demonstration on how to check pulse in carotid and radial arteries.;Review the importance  of being able to check your own pulse for safety during independent exercise       Expected Outcomes  Short Term: Able to explain why pulse checking is important during independent exercise;Long Term: Able to check pulse independently and accurately       Understanding of Exercise Prescription Yes       Intervention Provide education, explanation, and written materials on patient's individual exercise prescription       Expected Outcomes Short Term: Able to explain program exercise prescription;Long Term: Able to explain home exercise prescription to exercise independently          Exercise Goals Re-Evaluation :  Exercise Goals Re-Evaluation     Row Name 04/26/24 1453             Exercise Goal Re-Evaluation   Exercise Goals Review Increase Physical Activity;Able to understand and use Dyspnea scale;Increase Strength and Stamina;Able to check pulse independently;Able to understand and use rate of perceived exertion (RPE) scale;Knowledge and understanding of Target Heart Rate Range (THRR);Understanding of Exercise Prescription       Comments Reviewed RPE and dyspnea scale, THR and program prescription with pt today.  Pt voiced understanding and was given a copy of goals to take home.       Expected Outcomes Short: Use RPE daily to regulate intensity.  Long: Follow program prescription in THR.          Discharge Exercise Prescription (Final Exercise Prescription Changes):  Exercise Prescription Changes - 04/25/24 0900       Response to Exercise   Blood Pressure (Admit) 116/60    Blood Pressure (Exercise) 144/66    Blood Pressure (Exit) 132/60    Heart Rate (Admit) 63 bpm    Heart Rate (Exercise) 92 bpm    Heart Rate (Exit) 62 bpm    Oxygen  Saturation (Admit) 96 %    Oxygen  Saturation (Exercise) 93 %    Oxygen  Saturation (Exit) 97 %    Rating of Perceived Exertion (Exercise) 13    Perceived Dyspnea (Exercise) 1    Symptoms none    Comments walk test results          Nutrition:  Target Goals: Understanding of nutrition guidelines, daily intake of sodium 1500mg , cholesterol  200mg , calories 30% from fat and 7% or less from saturated fats, daily to have 5 or more servings of fruits and vegetables.  Biometrics:  Pre Biometrics - 04/25/24 0954       Pre Biometrics   Height 5' 8 (1.727 m)    Weight 179 lb 14.4 oz (81.6 kg)    Waist Circumference 36 inches    Hip Circumference 39 inches    Waist to Hip Ratio 0.92 %    BMI (Calculated) 27.36    Grip Strength 23.2 kg    Single Leg Stand 2.1 seconds           Nutrition Therapy Plan and Nutrition Goals:   Nutrition Assessments:  MEDIFICTS Score Key: >=70 Need to make dietary changes  40-70 Heart Healthy Diet <= 40 Therapeutic Level Cholesterol Diet  Flowsheet Row PULMONARY VIRTUAL BASED CARE from 04/22/2024 in [facility] CARDIAC REHABILITATION  Picture Your Plate Total Score on Admission 50   Picture Your Plate Scores: <59 Unhealthy dietary pattern with much room for improvement. 41-50 Dietary pattern unlikely to meet recommendations for good health and room for improvement. 51-60 More healthful dietary pattern, with some room for improvement.  >60 Healthy dietary pattern, although there may be some  specific behaviors that could be improved.    Nutrition Goals Re-Evaluation:   Nutrition Goals Discharge (Final Nutrition Goals Re-Evaluation):   Psychosocial: Target Goals: Acknowledge presence or absence of significant depression and/or stress, maximize coping skills, provide positive support system. Participant is able to verbalize types and ability to use techniques and skills needed for reducing stress and depression.  Initial Review & Psychosocial Screening:  Initial Psych Review & Screening - 04/22/24 1432       Initial Review   Current issues with Current Psychotropic Meds;Current Sleep Concerns;Current Depression      Family Dynamics   Good Support System? Yes    Comments Patient's wife and daughter support him.      Barriers   Psychosocial barriers to participate in program The  patient should benefit from training in stress management and relaxation.;There are no identifiable barriers or psychosocial needs.      Screening Interventions   Interventions Encouraged to exercise;To provide support and resources with identified psychosocial needs;Provide feedback about the scores to participant    Expected Outcomes Short Term goal: Utilizing psychosocial counselor, staff and physician to assist with identification of specific Stressors or current issues interfering with healing process. Setting desired goal for each stressor or current issue identified.;Long Term Goal: Stressors or current issues are controlled or eliminated.;Short Term goal: Identification and review with participant of any Quality of Life or Depression concerns found by scoring the questionnaire.;Long Term goal: The participant improves quality of Life and PHQ9 Scores as seen by post scores and/or verbalization of changes          Quality of Life Scores:  Scores of 19 and below usually indicate a poorer quality of life in these areas.  A difference of  2-3 points is a clinically meaningful difference.  A difference of 2-3 points in the total score of the Quality of Life Index has been associated with significant improvement in overall quality of life, self-image, physical symptoms, and general health in studies assessing change in quality of life.   PHQ-9: Review Flowsheet       04/25/2024 01/19/2020 12/20/2019  Depression screen PHQ 2/9  Decreased Interest 3 0 0  Down, Depressed, Hopeless 2 0 1  PHQ - 2 Score 5 0 1  Altered sleeping 3 - -  Tired, decreased energy 3 - -  Change in appetite 3 - -  Feeling bad or failure about yourself  0 - -  Trouble concentrating 3 - -  Moving slowly or fidgety/restless 0 - -  Suicidal thoughts 0 - -  PHQ-9 Score 17  - -  Difficult doing work/chores Very difficult - -    Details       Data saved with a previous flowsheet row definition        Interpretation  of Total Score  Total Score Depression Severity:  1-4 = Minimal depression, 5-9 = Mild depression, 10-14 = Moderate depression, 15-19 = Moderately severe depression, 20-27 = Severe depression   Psychosocial Evaluation and Intervention:  Psychosocial Evaluation - 04/22/24 1432       Psychosocial Evaluation & Interventions   Interventions Relaxation education;Encouraged to exercise with the program and follow exercise prescription;Stress management education    Comments Patient was referred to pulmonary rehab with chronic systolic HF. His wife and daughter answered questions for virtual call. He has mild dementia and is being treated with Aricept . He also has some depression treated with Zoloft . He and his wife live with their daughter. His wife  says he sleeps 90% of the time. She says he is able to follow directions but may need repeating at times. Her goals for him is to get him moving; to increase his activity level and that he will sleep less. There are no barriers identified for him to complete the program.    Expected Outcomes Short Term: Patient will start the program and attend consistently. Long Term: Patient will complete the program meeting personal goals.    Continue Psychosocial Services  Follow up required by staff          Psychosocial Re-Evaluation:   Psychosocial Discharge (Final Psychosocial Re-Evaluation):    Education: Education Goals: Education classes will be provided on a weekly basis, covering required topics. Participant will state understanding/return demonstration of topics presented.  Learning Barriers/Preferences:  Learning Barriers/Preferences - 04/22/24 1428       Learning Barriers/Preferences   Learning Barriers Hearing   Patient has dementia.   Learning Preferences Skilled Demonstration          Education Topics: How Lungs Work and Diseases: - Discuss the anatomy of the lungs and diseases that can affect the lungs, such as  COPD.   Exercise: -Discuss the importance of exercise, FITT principles of exercise, normal and abnormal responses to exercise, and how to exercise safely.   Environmental Irritants: -Discuss types of environmental irritants and how to limit exposure to environmental irritants.   Meds/Inhalers and oxygen : - Discuss respiratory medications, definition of an inhaler and oxygen , and the proper way to use an inhaler and oxygen .   Energy Saving Techniques: - Discuss methods to conserve energy and decrease shortness of breath when performing activities of daily living.    Bronchial Hygiene / Breathing Techniques: - Discuss breathing mechanics, pursed-lip breathing technique,  proper posture, effective ways to clear airways, and other functional breathing techniques   Cleaning Equipment: - Provides group verbal and written instruction about the health risks of elevated stress, cause of high stress, and healthy ways to reduce stress.   Nutrition I: Fats: - Discuss the types of cholesterol, what cholesterol does to the body, and how cholesterol levels can be controlled.   Nutrition II: Labels: -Discuss the different components of food labels and how to read food labels.   Respiratory Infections: - Discuss the signs and symptoms of respiratory infections, ways to prevent respiratory infections, and the importance of seeking medical treatment when having a respiratory infection.   Stress I: Signs and Symptoms: - Discuss the causes of stress, how stress may lead to anxiety and depression, and ways to limit stress.   Stress II: Relaxation: -Discuss relaxation techniques to limit stress.   Oxygen  for Home/Travel: - Discuss how to prepare for travel when on oxygen  and proper ways to transport and store oxygen  to ensure safety.   Knowledge Questionnaire Score:  Knowledge Questionnaire Score - 04/22/24 1430       Knowledge Questionnaire Score   Pre Score 13/18           Core Components/Risk Factors/Patient Goals at Admission:  Personal Goals and Risk Factors at Admission - 04/25/24 0955       Core Components/Risk Factors/Patient Goals on Admission    Weight Management Yes;Weight Maintenance    Intervention Weight Management: Develop a combined nutrition and exercise program designed to reach desired caloric intake, while maintaining appropriate intake of nutrient and fiber, sodium and fats, and appropriate energy expenditure required for the weight goal.;Weight Management: Provide education and appropriate resources to help participant work on  and attain dietary goals.    Admit Weight 179 lb 14.4 oz (81.6 kg)    Goal Weight: Short Term 174 lb (78.9 kg)    Goal Weight: Long Term 170 lb (77.1 kg)    Expected Outcomes Short Term: Continue to assess and modify interventions until short term weight is achieved;Long Term: Adherence to nutrition and physical activity/exercise program aimed toward attainment of established weight goal;Weight Maintenance: Understanding of the daily nutrition guidelines, which includes 25-35% calories from fat, 7% or less cal from saturated fats, less than 200mg  cholesterol, less than 1.5gm of sodium, & 5 or more servings of fruits and vegetables daily;Weight Loss: Understanding of general recommendations for a balanced deficit meal plan, which promotes 1-2 lb weight loss per week and includes a negative energy balance of [phone] kcal/d;Understanding recommendations for meals to include 15-35% energy as protein, 25-35% energy from fat, 35-60% energy from carbohydrates, less than 200mg  of dietary cholesterol, 20-35 gm of total fiber daily;Understanding of distribution of calorie intake throughout the day with the consumption of 4-5 meals/snacks    Improve shortness of breath with ADL's Yes    Intervention Provide education, individualized exercise plan and daily activity instruction to help decrease symptoms of SOB with activities of daily  living.    Expected Outcomes Short Term: Improve cardiorespiratory fitness to achieve a reduction of symptoms when performing ADLs;Long Term: Be able to perform more ADLs without symptoms or delay the onset of symptoms    Diabetes Yes    Intervention Provide education about signs/symptoms and action to take for hypo/hyperglycemia.;Provide education about proper nutrition, including hydration, and aerobic/resistive exercise prescription along with prescribed medications to achieve blood glucose in normal ranges: Fasting glucose 65-99 mg/dL    Expected Outcomes Short Term: Participant verbalizes understanding of the signs/symptoms and immediate care of hyper/hypoglycemia, proper foot care and importance of medication, aerobic/resistive exercise and nutrition plan for blood glucose control.;Long Term: Attainment of HbA1C < 7%.    Heart Failure Yes    Intervention Provide a combined exercise and nutrition program that is supplemented with education, support and counseling about heart failure. Directed toward relieving symptoms such as shortness of breath, decreased exercise tolerance, and extremity edema.    Expected Outcomes Improve functional capacity of life;Short term: Attendance in program 2-3 days a week with increased exercise capacity. Reported lower sodium intake. Reported increased fruit and vegetable intake. Reports medication compliance.;Short term: Daily weights obtained and reported for increase. Utilizing diuretic protocols set by physician.;Long term: Adoption of self-care skills and reduction of barriers for early signs and symptoms recognition and intervention leading to self-care maintenance.    Hypertension Yes    Intervention Provide education on lifestyle modifcations including regular physical activity/exercise, weight management, moderate sodium restriction and increased consumption of fresh fruit, vegetables, and low fat dairy, alcohol  moderation, and smoking cessation.;Monitor  prescription use compliance.    Expected Outcomes Short Term: Continued assessment and intervention until BP is < 140/49mm HG in hypertensive participants. < 130/60mm HG in hypertensive participants with diabetes, heart failure or chronic kidney disease.;Long Term: Maintenance of blood pressure at goal levels.    Lipids Yes    Intervention Provide education and support for participant on nutrition & aerobic/resistive exercise along with prescribed medications to achieve LDL 70mg , HDL >40mg .    Expected Outcomes Short Term: Participant states understanding of desired cholesterol values and is compliant with medications prescribed. Participant is following exercise prescription and nutrition guidelines.;Long Term: Cholesterol controlled with medications as prescribed, with individualized exercise  RX and with personalized nutrition plan. Value goals: LDL < 70mg , HDL > 40 mg.          Core Components/Risk Factors/Patient Goals Review:    Core Components/Risk Factors/Patient Goals at Discharge (Final Review):    ITP Comments:  ITP Comments     Row Name 04/22/24 1441 04/26/24 1453 04/27/24 1815 05/25/24 0922 05/27/24 1139   ITP Comments Virtual orientation visit completed for pulmonary rehab with chronic systolic HF. On-site orientation visit scheduled for 04/25/24 at 0800. First full day of exercise!  Patient was oriented to gym and equipment including functions, settings, policies, and procedures.  Patient's individual exercise prescription and treatment plan were reviewed.  All starting workloads were established based on the results of the 6 minute walk test done at initial orientation visit.  The plan for exercise progression was also introduced and progression will be customized based on patient's performance and goals. 30 day review completed. ITP sent to Dr.Jehanzeb Memon, Medical Director of  Pulmonary Rehab. Continue with ITP unless changes are made by physician.  He only started to exercise  yesterday. 30 day review completed. ITP sent to Dr.Jehanzeb Memon, Medical Director of  Pulmonary Rehab. Continue with ITP unless changes are made by physician.  New to the program. Patient's wife called and has had knee surgery and is not able to drive. She wants to cancel his appointments for the remaining of the year. She hopes to be driving again in 6 to 8 weeks.    Row Name 06/21/24 1143 07/19/24 1702 08/16/24 1134       ITP Comments 30 day review completed. ITP sent to Dr.Jehanzeb Memon, Medical Director of  Pulmonary Rehab. Continue with ITP unless changes are made by physician.    Currently on hold post knee surgery until January 30 day review completed. ITP sent to Dr.Jehanzeb Memon, Medical Director of  Pulmonary Rehab. Continue with ITP unless changes are made by physician.    Currently on hold post knee surgery until January Patient had knee surgery and has not returned our calls or contact attempts.  We will discharge him at this time.        Comments: 30 day review      [1]  Current Outpatient Medications:    amiodarone  (PACERONE ) 200 MG tablet, Take 1 tablet (200 mg total) by mouth daily., Disp: 90 tablet, Rfl: 2   apixaban  (ELIQUIS ) 5 MG TABS tablet, TAKE 1 TABLET(5 MG) BY MOUTH TWICE DAILY, Disp: 180 tablet, Rfl: 1   BD PEN NEEDLE NANO U/F 32G X 4 MM MISC, 1 each by Other route daily. , Disp: , Rfl:    Biotin -Vitamin C (HAIR SKIN NAILS GUMMIES PO), Take 2 tablets by mouth daily., Disp: , Rfl:    Continuous Glucose Sensor (FREESTYLE LIBRE 3 PLUS SENSOR) MISC, by Does not apply route. Change sensor every 15 days., Disp: , Rfl:    Cyanocobalamin  (VITAMIN B-12) 5000 MCG SUBL, Place 1 tablet under the tongue daily., Disp: , Rfl:    donepezil  (ARICEPT ) 10 MG tablet, Take 1 tablet (10 mg total) by mouth at bedtime., Disp: 90 tablet, Rfl: 3   empagliflozin  ([name] ) 10 MG TABS tablet, Take 1 tablet (10 mg total) by mouth daily before breakfast., Disp: 90 tablet, Rfl: 1    fenofibrate  160 MG tablet, Take 160 mg by mouth every morning., Disp: , Rfl:    Ferrous Sulfate  (IRON PO), Take 1 tablet by mouth daily., Disp: , Rfl:    Insulin  Aspart (NOVOLOG   FLEXPEN [place]), Inject 8-10 Units into the skin 3 (three) times daily before meals. Dose per sliding scale, Disp: , Rfl:    insulin  degludec (TRESIBA FLEXTOUCH) 100 UNIT/ML FlexTouch Pen, Inject 32 Units into the skin daily., Disp: , Rfl:    levothyroxine  (SYNTHROID ) 50 MCG tablet, Take 50 mcg by mouth every morning., Disp: , Rfl:    losartan  (COZAAR ) 25 MG tablet, TAKE 1/2 TABLET(12.5 MG) BY MOUTH DAILY, Disp: 45 tablet, Rfl: 1   memantine  (NAMENDA ) 10 MG tablet, Take 1 tablet (10 mg total) by mouth 2 (two) times daily., Disp: 180 tablet, Rfl: 3   metFORMIN  (GLUCOPHAGE ) 500 MG tablet, Take 500 mg by mouth 2 (two) times daily with a meal., Disp: , Rfl:    Multiple Vitamin (MULTIVITAMIN WITH MINERALS) TABS tablet, Take 1 tablet by mouth daily., Disp: , Rfl:    rosuvastatin  (CRESTOR ) 20 MG tablet, TAKE 1 TABLET(20 MG) BY MOUTH DAILY, Disp: 90 tablet, Rfl: 1   sertraline  (ZOLOFT ) 25 MG tablet, Take 1 tablet (25 mg total) by mouth daily., Disp: 90 tablet, Rfl: 3   tamsulosin  (FLOMAX ) 0.4 MG CAPS capsule, Take 0.4 mg by mouth at bedtime., Disp: , Rfl:    Vedolizumab  (ENTYVIO  IV), Inject into the vein every 28 (twenty-eight) days., Disp: , Rfl:  [2]  Social History Tobacco Use  Smoking Status Never  Smokeless Tobacco Never

## 2024-08-18 ENCOUNTER — Encounter (HOSPITAL_COMMUNITY)

## 2024-08-23 ENCOUNTER — Encounter (HOSPITAL_COMMUNITY)

## 2024-08-24 ENCOUNTER — Ambulatory Visit

## 2024-08-25 ENCOUNTER — Encounter (HOSPITAL_COMMUNITY)

## 2024-08-25 LAB — CUP PACEART REMOTE DEVICE CHECK
Battery Remaining Longevity: 165 mo
Battery Voltage: 3.09 V
Brady Statistic RV Percent Paced: 0.18 %
Date Time Interrogation Session: 20260203201546
HighPow Impedance: 63 Ohm
Implantable Lead Connection Status: 753985
Implantable Lead Implant Date: 20250623
Implantable Lead Location: 753860
Implantable Pulse Generator Implant Date: 20250623
Lead Channel Impedance Value: 380 Ohm
Lead Channel Impedance Value: 437 Ohm
Lead Channel Pacing Threshold Amplitude: 0.5 V
Lead Channel Pacing Threshold Pulse Width: 0.4 ms
Lead Channel Sensing Intrinsic Amplitude: 13.5 mV
Lead Channel Setting Pacing Amplitude: 2 V
Lead Channel Setting Pacing Pulse Width: 0.4 ms
Lead Channel Setting Sensing Sensitivity: 0.3 mV
Zone Setting Status: 755011

## 2024-08-26 ENCOUNTER — Ambulatory Visit: Admitting: Cardiology

## 2024-08-26 ENCOUNTER — Encounter: Payer: Self-pay | Admitting: Cardiology

## 2024-08-26 VITALS — BP 134/84 | HR 55 | Ht 68.0 in | Wt 182.0 lb

## 2024-08-26 DIAGNOSIS — I472 Ventricular tachycardia, unspecified: Secondary | ICD-10-CM

## 2024-08-26 DIAGNOSIS — I480 Paroxysmal atrial fibrillation: Secondary | ICD-10-CM

## 2024-08-26 DIAGNOSIS — I25119 Atherosclerotic heart disease of native coronary artery with unspecified angina pectoris: Secondary | ICD-10-CM

## 2024-08-26 DIAGNOSIS — I5020 Unspecified systolic (congestive) heart failure: Secondary | ICD-10-CM

## 2024-08-26 NOTE — Progress Notes (Signed)
 "    Cardiology Office Note  Date: 08/26/2024   ID: Patrick Miranda, DOB 06/04/1946, MRN [id number]  History of Present Illness: Patrick Miranda is a 79 y.o. male that I last saw in April 2025.  I reviewed substantial interval records.  He was hospitalized in June 2025 following an out-of-hospital cardiac arrest due to VT.  He was found to have reduction in LVEF to the range of 35 to 40% at that time and underwent cardiac catheterization showing patent LIMA to LAD with occluded mid to distal LAD, occluded SVG to diagonal, and patent SVG to distal RCA.  No clear targets were found for acute revascularization with recommendation to pursue medical therapy.  He was ultimately seen by EP and underwent placement of a Medtronic single-chamber ICD.  He is here today with his wife and daughter.  He does have dementia which limits history, but overall indicates that he has been feeling well.  Family corroborates this.  He has had no sudden syncope and reports no chest pain.  No obvious device discharges.  Medtronic single-chamber ICD in place, now followed by Dr. Almetta.  Recent device interrogation showed normal function, 1 episode of VT that was converted with ATP.  I went over his medications.  We discussed getting an updated echocardiogram and then determining if any further adjustments in GDMT are necessary.  He does not report any fluid retention and at present is not on standing diuretics.  Physical Exam: VS:  BP 134/84   Pulse (!) 55   Ht 5' 8 (1.727 m)   Wt 182 lb (82.6 kg)   SpO2 97%   BMI 27.67 kg/m , BMI Body mass index is 27.67 kg/m.  Wt Readings from Last 3 Encounters:  08/26/24 182 lb (82.6 kg)  05/19/24 178 lb 3.2 oz (80.8 kg)  04/25/24 179 lb 14.4 oz (81.6 kg)    General: Patient appears comfortable at rest. HEENT: Conjunctiva and lids normal. Neck: Supple, no elevated JVP or carotid bruits. Lungs: Clear to auscultation, nonlabored breathing at rest. Cardiac: Regular  rate and rhythm, no S3 or significant systolic murmur. Extremities: No pitting edema.  ECG:  An ECG dated 01/12/2024 was personally reviewed today and demonstrated:  Sinus bradycardia with nonspecific ST changes.  Labwork: 01/12/2024: Hemoglobin 10.3; Magnesium  1.8; Platelets 250 04/15/2024: ALT 21; AST 45; BUN 25; Creatinine, Ser 1.54; Potassium 4.4; Sodium 142; TSH 2.910  February 2026: Potassium 4.2, BUN 22, creatinine 1.4, GFR 51, AST 45, ALT 20, hemoglobin 12.2, platelets 225  Other Studies Reviewed Today:  Echocardiogram 01/07/2024:  1. There is global LV hypokinesis with aneursymal deformity of the basal  to mid inferior wall. No evidence of LV thrombus with definity . Left  ventricular ejection fraction, by estimation, is 35 to 40%. Left  ventricular ejection fraction by 2D MOD  biplane is 38.3 %. The left ventricle has moderately decreased function.  The left ventricle demonstrates global hypokinesis. The left ventricular  internal cavity size was mildly dilated. There is mild concentric left  ventricular hypertrophy of the basal  segment. Left ventricular diastolic parameters are consistent with Grade  II diastolic dysfunction (pseudonormalization).   2. Right ventricular systolic function is moderately reduced. The right  ventricular size is moderately enlarged. There is normal pulmonary artery  systolic pressure. The estimated right ventricular systolic pressure is  21.5 mmHg.   3. Left atrial size was moderately dilated.   4. Right atrial size was mild to moderately dilated.   5. The mitral  valve is abnormal. Moderate mitral valve regurgitation.   6. The aortic valve is tricuspid. There is mild calcification of the  aortic valve. Aortic valve regurgitation is moderate.   7. Aortic dilatation noted. There is borderline dilatation of the  ascending aorta, measuring 38 mm.   8. The inferior vena cava is normal in size with greater than 50%  respiratory variability, suggesting  right atrial pressure of 3 mmHg.   Cardiac catheterization 01/07/2024:   Prox RCA lesion is 99% stenosed.   Prox LAD to Mid LAD lesion is 80% stenosed.   Mid LAD lesion is 100% stenosed.   Dist LAD lesion is 100% stenosed.   Origin to Prox Graft lesion is 100% stenosed.   LIMA graft was visualized by angiography and is normal in caliber.   SVG graft was visualized by angiography and is normal in caliber.   SVG graft was visualized by angiography.   The graft exhibits no disease.   The graft exhibits no disease.   LV end diastolic pressure is normal.   2 vessel occlusive CAD Patent LIMA to the mid LAD but the LAD is occluded after the graft insertion.  Occluded SVG to the diagonal. The diagonals are small in caliber and there is good antegrade flow in the native vessel Patent SVG to distal RCA Normal LVEDP 15 mm Hg  Assessment and Plan:  1.  HFrEF with history of  ischemic cardiomyopathy, LVEF decreased to the range of 35 to 40% by echocardiogram in June 2025 at which point the patient was hospitalized due to out-of-hospital cardiac arrest in the setting of VT.  Describes NYHA class II dyspnea at this time, no obvious fluid retention.  Current GDMT includes Cozaar  12.5 mg daily and [name]  10 mg daily.  Plan to update echocardiogram.  2.  Paroxysmal atrial fibrillation with CHA2DS2-VASc score of 7.  He is on amiodarone  200 mg daily and Eliquis  5 mg twice daily.   3.  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  Cardiac catheterization in June 2025 showed patent LIMA to LAD with occlusion of the mid to distal LAD, occluded SVG to diagonal (small caliber), and patent SVG to RCA.  Medical therapy recommended.  No angina at this time.  Not on aspirin  given use of Eliquis .  Continue Crestor  20 mg daily.  4.  Out-of-hospital cardiac arrest due to VT and June 2025.  Now status post Medtronic single-chamber ICD with follow-up by Dr. Almetta.  He is also on amiodarone  200 mg daily for  concurrent management of paroxysmal atrial fibrillation.  Establishing EP follow-up in [place].  Disposition:  Follow up 3 months.  Signed, Jayson JUDITHANN Sierras, M.D., F.A.C.C.  [facility] "

## 2024-08-26 NOTE — Patient Instructions (Addendum)
 Medication Instructions:   Your physician recommends that you continue on your current medications as directed. Please refer to the Current Medication list given to you today.   Labwork: None today  Testing/Procedures: Your physician has requested that you have an echocardiogram. Echocardiography is a painless test that uses sound waves to create images of your heart. It provides your doctor with information about the size and shape of your heart and how well your hearts chambers and valves are working. This procedure takes approximately one hour. There are no restrictions for this procedure. Please do NOT wear cologne, perfume, aftershave, or lotions (deodorant is allowed). Please arrive 15 minutes prior to your appointment time.  Please note: We ask at that you not bring children with you during ultrasound (echo/ vascular) testing. Due to room size and safety concerns, children are not allowed in the ultrasound rooms during exams. Our front office staff cannot provide observation of children in our lobby area while testing is being conducted. An adult accompanying a patient to their appointment will only be allowed in the ultrasound room at the discretion of the ultrasound technician under special circumstances. We apologize for any inconvenience.   Follow-Up: 3 months Dr.McDowell  March follow up with EP, Dr.Carlisle in the [place] office   Any Other Special Instructions Will Be Listed Below (If Applicable).  If you need a refill on your cardiac medications before your next appointment, please call your pharmacy.

## 2024-08-30 ENCOUNTER — Encounter (HOSPITAL_COMMUNITY)

## 2024-09-01 ENCOUNTER — Encounter (HOSPITAL_COMMUNITY)

## 2024-09-06 ENCOUNTER — Encounter (HOSPITAL_COMMUNITY)

## 2024-09-28 ENCOUNTER — Ambulatory Visit: Admitting: Podiatry

## 2024-10-05 ENCOUNTER — Other Ambulatory Visit (HOSPITAL_COMMUNITY)

## 2024-10-19 ENCOUNTER — Ambulatory Visit: Admitting: Pulmonary Disease

## 2024-11-23 ENCOUNTER — Encounter

## 2024-11-28 ENCOUNTER — Ambulatory Visit: Admitting: Cardiology

## 2024-12-13 ENCOUNTER — Ambulatory Visit: Admitting: Adult Health

## 2025-02-22 ENCOUNTER — Encounter

## 2025-05-24 ENCOUNTER — Encounter

## 2025-08-23 ENCOUNTER — Encounter

## 2025-11-22 ENCOUNTER — Encounter
